# Patient Record
Sex: Male | Born: 1950
Health system: Southern US, Community
[De-identification: ages and names within clinical notes are randomized; demographics above are authoritative.]

## PROBLEM LIST (undated history)

## (undated) DIAGNOSIS — N186 End stage renal disease: Secondary | ICD-10-CM

## (undated) DIAGNOSIS — M1990 Unspecified osteoarthritis, unspecified site: Secondary | ICD-10-CM

## (undated) DIAGNOSIS — G4700 Insomnia, unspecified: Secondary | ICD-10-CM

## (undated) DIAGNOSIS — F329 Major depressive disorder, single episode, unspecified: Secondary | ICD-10-CM

## (undated) DIAGNOSIS — D631 Anemia in chronic kidney disease: Secondary | ICD-10-CM

## (undated) DIAGNOSIS — Z9289 Personal history of other medical treatment: Secondary | ICD-10-CM

## (undated) DIAGNOSIS — I428 Other cardiomyopathies: Secondary | ICD-10-CM

## (undated) DIAGNOSIS — I10 Essential (primary) hypertension: Secondary | ICD-10-CM

## (undated) DIAGNOSIS — E119 Type 2 diabetes mellitus without complications: Secondary | ICD-10-CM

## (undated) DIAGNOSIS — N039 Chronic nephritic syndrome with unspecified morphologic changes: Secondary | ICD-10-CM

## (undated) DIAGNOSIS — F32A Depression, unspecified: Secondary | ICD-10-CM

## (undated) DIAGNOSIS — F419 Anxiety disorder, unspecified: Secondary | ICD-10-CM

## (undated) DIAGNOSIS — H409 Unspecified glaucoma: Secondary | ICD-10-CM

## (undated) DIAGNOSIS — Z8673 Personal history of transient ischemic attack (TIA), and cerebral infarction without residual deficits: Secondary | ICD-10-CM

## (undated) DIAGNOSIS — K219 Gastro-esophageal reflux disease without esophagitis: Secondary | ICD-10-CM

## (undated) DIAGNOSIS — Z992 Dependence on renal dialysis: Secondary | ICD-10-CM

## (undated) HISTORY — PX: LUMBAR SPINE SURGERY: SHX701

## (undated) HISTORY — PX: EYE SURGERY: SHX253

## (undated) HISTORY — DX: Other cardiomyopathies: I42.8

## (undated) HISTORY — PX: BACK SURGERY: SHX140

## (undated) HISTORY — DX: Personal history of transient ischemic attack (TIA), and cerebral infarction without residual deficits: Z86.73

## (undated) HISTORY — PX: AV FISTULA PLACEMENT: SHX1204

## (undated) HISTORY — PX: BELOW KNEE LEG AMPUTATION: SUR23

## (undated) HISTORY — DX: Type 2 diabetes mellitus without complications: E11.9

## (undated) HISTORY — PX: COLONOSCOPY: SHX174

## (undated) HISTORY — DX: Anemia in chronic kidney disease: D63.1

## (undated) HISTORY — DX: Chronic nephritic syndrome with unspecified morphologic changes: N03.9

## (undated) HISTORY — DX: Gastro-esophageal reflux disease without esophagitis: K21.9

## (undated) HISTORY — DX: Essential (primary) hypertension: I10

---

## 1999-10-10 ENCOUNTER — Encounter: Payer: Self-pay | Admitting: Neurosurgery

## 1999-10-10 ENCOUNTER — Ambulatory Visit (HOSPITAL_COMMUNITY): Admission: RE | Admit: 1999-10-10 | Discharge: 1999-10-10 | Payer: Self-pay | Admitting: Neurosurgery

## 2007-02-28 ENCOUNTER — Ambulatory Visit: Payer: Self-pay | Admitting: Cardiology

## 2007-03-09 ENCOUNTER — Ambulatory Visit: Payer: Self-pay | Admitting: Cardiology

## 2007-03-16 ENCOUNTER — Ambulatory Visit (HOSPITAL_COMMUNITY): Admission: RE | Admit: 2007-03-16 | Discharge: 2007-03-16 | Payer: Self-pay | Admitting: Family Medicine

## 2007-03-17 ENCOUNTER — Ambulatory Visit: Payer: Self-pay | Admitting: Cardiology

## 2008-01-27 ENCOUNTER — Ambulatory Visit: Payer: Self-pay | Admitting: Cardiology

## 2008-03-09 ENCOUNTER — Ambulatory Visit (HOSPITAL_COMMUNITY): Admission: RE | Admit: 2008-03-09 | Discharge: 2008-03-09 | Payer: Self-pay | Admitting: Family Medicine

## 2008-03-16 ENCOUNTER — Ambulatory Visit (HOSPITAL_COMMUNITY): Admission: RE | Admit: 2008-03-16 | Discharge: 2008-03-16 | Payer: Self-pay | Admitting: Family Medicine

## 2009-06-20 ENCOUNTER — Ambulatory Visit: Payer: Self-pay | Admitting: Cardiology

## 2010-11-19 ENCOUNTER — Encounter: Payer: Self-pay | Admitting: Cardiology

## 2010-11-20 ENCOUNTER — Encounter: Payer: Self-pay | Admitting: Cardiology

## 2010-11-20 ENCOUNTER — Ambulatory Visit (INDEPENDENT_AMBULATORY_CARE_PROVIDER_SITE_OTHER): Payer: Medicare Other | Admitting: Cardiology

## 2010-11-20 VITALS — BP 154/91 | HR 75 | Ht 74.0 in | Wt 292.0 lb

## 2010-11-20 DIAGNOSIS — I5022 Chronic systolic (congestive) heart failure: Secondary | ICD-10-CM | POA: Insufficient documentation

## 2010-11-20 DIAGNOSIS — N186 End stage renal disease: Secondary | ICD-10-CM

## 2010-11-20 DIAGNOSIS — R0989 Other specified symptoms and signs involving the circulatory and respiratory systems: Secondary | ICD-10-CM | POA: Insufficient documentation

## 2010-11-20 DIAGNOSIS — Z992 Dependence on renal dialysis: Secondary | ICD-10-CM

## 2010-11-20 DIAGNOSIS — I429 Cardiomyopathy, unspecified: Secondary | ICD-10-CM

## 2010-11-20 DIAGNOSIS — I428 Other cardiomyopathies: Secondary | ICD-10-CM | POA: Insufficient documentation

## 2010-11-20 DIAGNOSIS — I10 Essential (primary) hypertension: Secondary | ICD-10-CM

## 2010-11-20 MED ORDER — RAMIPRIL 2.5 MG PO CAPS: 2.5000 mg | ORAL_CAPSULE | Freq: Every day | ORAL | Status: DC

## 2010-11-20 NOTE — Assessment & Plan Note (Signed)
Patient reports stable weight, removal of a few pounds with each hemodialysis session. Denies any escalating dyspnea on exertion

## 2010-11-20 NOTE — Assessment & Plan Note (Signed)
High-pitched, cannot exclude radiation from left forearm AV fistula. Carotid Dopplers will be obtained.

## 2010-11-20 NOTE — Patient Instructions (Signed)
   Follow up in 1 month as scheduled.  Start Altace (ramipril) 2.5mg  once daily. Your physician has requested that you have an echocardiogram. Echocardiography is a painless test that uses sound waves to create images of your heart. It provides your doctor with information about the size and shape of your heart and how well your heart's chambers and valves are working. This procedure takes approximately one hour. There are no restrictions for this procedure. Your physician has requested that you have a carotid duplex. This test is an ultrasound of the carotid arteries in your neck. It looks at blood flow through these arteries that supply the brain with blood. Allow one hour for this exam. There are no restrictions or special instructions.

## 2010-11-20 NOTE — Assessment & Plan Note (Signed)
Continue regular followup with Dr. Lowanda Foster.

## 2010-11-20 NOTE — Assessment & Plan Note (Signed)
Continue regular followup with primary care physician, also regular hemodialysis. We discussed sodium restriction. Altace 2.5 mg daily is also being added.

## 2010-11-20 NOTE — Progress Notes (Signed)
Clinical Summary [name] is a 60 y.o.male referred by Dr. Hilma [name] for cardiology consultation. He has in fact seen Dr. Dannielle [name] in the past, underwent cardiac testing at [facility] back in 2008 and 2009, noted below.  He reports long-standing dyspnea on exertion, NYHA class 2-3 at most, no escalation. He indicates compliance with his hemodialysis, and stable weight overall. No problems with lower extremity edema, palpitations, or syncope. Also denies exertional chest pain.  He has been followed for a nonischemic cardiomyopathy in the past, last LVEF 40-45%, noted below. No evidence of ischemia at that time.  Today we reviewed his medications, discussed the likely etiologies of his cardiomyopathy over the years, noted in the setting of hypertension and regular alcohol use. Also discussed the importance of medical therapy and followup over time, sodium restriction.   Allergies  Allergen Reactions  . Penicillins     Current outpatient prescriptions:aspirin 325 MG tablet, Take 325 mg by mouth daily.  , Disp: , Rfl: ;  B Complex-C-Folic Acid (DIALYVITE TABLET) TABS, Take 1 tablet by mouth as directed.  , Disp: , Rfl: ;  carvedilol (COREG) 6.25 MG tablet, Take 6.25 mg by mouth 2 (two) times daily with a meal.  , Disp: , Rfl: ;  colchicine 0.6 MG tablet, Take 0.6 mg by mouth 2 (two) times daily.  , Disp: , Rfl:  insulin glargine (LANTUS) 100 UNIT/ML injection, Inject into the skin at bedtime.  , Disp: , Rfl: ;  omeprazole (PRILOSEC) 40 MG capsule, Take 40 mg by mouth daily.  , Disp: , Rfl: ;  ramipril (ALTACE) 2.5 MG capsule, Take 1 capsule (2.5 mg total) by mouth daily., Disp: 30 capsule, Rfl: 6;  sevelamer (RENVELA) 800 MG tablet, Take 1,600 mg by mouth 3 (three) times daily with meals.  , Disp: , Rfl:  DISCONTD: hydrOXYzine (ATARAX) 50 MG tablet, Take 50 mg by mouth daily.  , Disp: , Rfl:   Past Medical History  Diagnosis Date  . Nonischemic cardiomyopathy     Hypertension and regular alcohol use,  LVEF 40-45%  . End stage renal disease     Hemodialysis on Monday, Wednesday, Friday  . Chronic systolic heart failure   . Anemia in chronic kidney disease   . Type 2 diabetes mellitus   . Anemia of chronic disease   . Essential hypertension, benign   . GERD (gastroesophageal reflux disease)     Past Surgical History  Procedure Date  . Lumbar spine surgery     Family History  Problem Relation Age of Onset  . Heart attack Brother     25  . Heart attack Brother     37  . Heart attack Brother     50    Social History [name] reports that he has never smoked. He has never used smokeless tobacco. [name] reports that he drinks alcohol.  Review of Systems Reports stable appetite, no palpitations or unusual dizziness. No orthopnea or PND. No cough or wheezing. Otherwise reviewed and negative.  Physical Examination Filed Vitals:   11/20/10 0904  BP: 154/91  Pulse: 75  Obese male in no acute distress. HEENT: Conjunctiva and lids normal, oropharynx with moist mucosa, poor dentition. Neck: Supple, no elevated JVP, possible left carotid bruit. Lungs: Decreased but clear overall, nonlabored. Cardiac: Indistinct PMI, regular rate and rhythm, no S3, soft systolic murmur. Abdomen: Nontender, bowel sounds present, no hepatomegaly. Skin: Warm and dry. Musculoskeletal: No kyphosis. Extremities: No distal pitting edema. AV fistula noted in the  left forearm with palpable thrill. Neuropsychiatric: Alert and oriented x3, affect grossly appropriate.   ECG Sinus rhythm at 72 beats per minute with left anterior fascicular block.  Studies Echocardiogram 01/27/2008: Moderately dilated left ventricle with LVEF 40-45%, moderate left atrial enlargement, mild pulmonary hypertension, no pericardial effusion.  Adenosine Cardiolite 03/09/2007: No diagnostic ST segment changes. No ischemic defects are noted, LVEF 42%.  Problem List and Plan

## 2010-11-20 NOTE — Assessment & Plan Note (Signed)
Noted in the setting of hypertension and also regular alcohol use, previous ischemic workup was reassuring. Plan will be to followup with a 2-D echocardiogram to reassess LVEF, and further titrate medical therapy over time. ACE Inhibitor is being added.

## 2010-11-27 DIAGNOSIS — R0602 Shortness of breath: Secondary | ICD-10-CM

## 2010-12-09 ENCOUNTER — Other Ambulatory Visit (HOSPITAL_COMMUNITY): Payer: Self-pay | Admitting: Family Medicine

## 2010-12-09 DIAGNOSIS — M545 Low back pain, unspecified: Secondary | ICD-10-CM

## 2010-12-09 DIAGNOSIS — M51379 Other intervertebral disc degeneration, lumbosacral region without mention of lumbar back pain or lower extremity pain: Secondary | ICD-10-CM

## 2010-12-09 DIAGNOSIS — M5137 Other intervertebral disc degeneration, lumbosacral region: Secondary | ICD-10-CM

## 2010-12-11 ENCOUNTER — Ambulatory Visit (HOSPITAL_COMMUNITY)
Admission: RE | Admit: 2010-12-11 | Discharge: 2010-12-11 | Disposition: A | Discharge status: Home / Self Care | Payer: Medicare Other | Source: Ambulatory Visit | Attending: Family Medicine | Admitting: Family Medicine

## 2010-12-11 DIAGNOSIS — M545 Low back pain, unspecified: Secondary | ICD-10-CM | POA: Insufficient documentation

## 2010-12-11 DIAGNOSIS — M5137 Other intervertebral disc degeneration, lumbosacral region: Secondary | ICD-10-CM

## 2010-12-11 DIAGNOSIS — M5126 Other intervertebral disc displacement, lumbar region: Secondary | ICD-10-CM | POA: Insufficient documentation

## 2010-12-19 NOTE — Op Note (Signed)
[place]  Patient:    Jacob [name], Jacob [name]                         MRN: [id number] Proc. Date: 10/10/99 Adm. Date:  [id number] Disc. Date: [id number] Attending:  Abran Duke                           Operative Report  PREOPERATIVE DIAGNOSIS:  Left L5-S1 herniated nucleus pulposus with radiculopathy.  POSTOPERATIVE DIAGNOSIS:  Left L5-S1 herniated nucleus pulposus with radiculopathy.  PROCEDURE:  Left L5-S1 laminotomy and microdiskectomy.  SURGEON:  Earnie Larsson, M.D.  ASSISTANT:  None.  ANESTHESIA:  General endotracheal.  INDICATIONS:  Jacob [name] is a 60 year old black male with history of back and left lower extremity pain, paresthesias and weakness consistent with a left-sided S1  radiculopathy, which has failed efforts of conservative management.  MRI scan demonstrates a left L5-S1 disk herniation, compression of the left side S1 nerve root.  Patient has been counselled as to his options and he wishes to proceed with a left-sided L5-S1 laminotomy and microdiskectomy for hopeful improvement of his symptoms.  OPERATIVE NOTE:  Patient taken to the operating room, placed on operating table in supine position.  After adequate level of anesthesia achieved, patient was positioned prone onto a Wilson frame, appropriately padded.  Patients lumbar region was shaved and prepped sterilely.  A #10 blade was used to make a linear  skin incision overlying the L5-S1 interspace.  This carried down sharply in the  midline.  A subperiosteal dissection was performed on the left side exposing the lamina and facet joints of L5 and S1.  Deep self-retaining retractor was placed. X-ray was taken and level was confirmed.  A laminotomy was then performed using a high-speed drill and Kerrison rongeurs to remove the inferior one-half of the lamina of L5, the medial edge of the L5-S1 facet joint and the superior rim of he sacrum.  Ligamentum flavum was then  elevated and resected in a piecemeal fashion using Kerrison rongeurs.  The underlying thecal sac and exiting S1 nerve root were identified.  Microscope was draped and brought into the field and used for microdissection of the left side S1 nerve root and underlying disk herniation.  Epidural venous plexus was coagulated and cut.  Thecal sac and S1 nerve root were mobilized and retracted towards the midline using a DErrico nerve root retractor. The disk herniation was readily apparent.  This was incised with a 15 blade in  rectangular fashion.  A wide disk space cleanout was achieved using pituitary rongeurs, upward-angled pituitary rongeurs and Epstein curets.  All elements of  disk herniation was completely removed.  All ______ of obvious degenerative disk material was removed from the interspace.  At the conclusion of the diskectomy there was no evidence of injury to the thecal sac or nerve roots.  There was no  evidence of any continued compression or residual disk herniation.  The wound was then copiously irrigated with antibiotic solution.  Gelfoam was placed topically for hemostasis.  Microscope and retractor system were removed.  Hemostasis of the muscle achieved with electrocautery.  The wound was then closed in layers with Vicryl sutures.  Staples are applied to the surface.  There were no apparent complications.  The patient tolerated the procedure well and he returned to recovery room postoperatively. DD:  10/10/99 TD:  10/12/99 Job: [id number]

## 2010-12-23 ENCOUNTER — Encounter: Payer: Self-pay | Admitting: Cardiology

## 2010-12-23 ENCOUNTER — Ambulatory Visit (INDEPENDENT_AMBULATORY_CARE_PROVIDER_SITE_OTHER): Payer: Medicare Other | Admitting: Cardiology

## 2010-12-23 VITALS — BP 113/71 | HR 77 | Ht 74.0 in | Wt 294.0 lb

## 2010-12-23 DIAGNOSIS — I428 Other cardiomyopathies: Secondary | ICD-10-CM

## 2010-12-23 DIAGNOSIS — I10 Essential (primary) hypertension: Secondary | ICD-10-CM

## 2010-12-23 DIAGNOSIS — R0989 Other specified symptoms and signs involving the circulatory and respiratory systems: Secondary | ICD-10-CM

## 2010-12-23 NOTE — Patient Instructions (Addendum)
Continue all current medications.  Labs for FLP & LFT just before next office visit.  Will mail reminder to you. Follow up in  4 months

## 2010-12-23 NOTE — Assessment & Plan Note (Addendum)
Only mild carotid artery atherosclerosis noted by recent Dopplers. Continue aspirin. We did discuss aggressive lipid management. Followup fasting lipid profile and liver function tests for his next visit.

## 2010-12-23 NOTE — Progress Notes (Signed)
Clinical Summary Mr. Butorac is a 60 y.o.male presenting for followup. He was seen in late April for followup of nonischemic cardiomyopathy. Low-dose Altace was added. Followup echocardiogram is noted below, showing overall preserved LVEF. Carotid Dopplers showed overall mild bilateral carotid plaque, less than 50%.  We reviewed the results of his studies today. He reports compliance with medications and hemodialysis. No progressive shortness of breath or chest pain. States that he had his lipids checked a few months ago by Dr. Hilma Favors, has not been on any lipid-lowering agents over time.   Allergies  Allergen Reactions  . Penicillins     Current outpatient prescriptions:aspirin 325 MG tablet, Take 325 mg by mouth daily.  , Disp: , Rfl: ;  B Complex-C-Folic Acid (DIALYVITE TABLET) TABS, Take 1 tablet by mouth as directed.  , Disp: , Rfl: ;  carvedilol (COREG) 6.25 MG tablet, Take 6.25 mg by mouth 2 (two) times daily with a meal.  , Disp: , Rfl: ;  colchicine 0.6 MG tablet, Take 0.6 mg by mouth 2 (two) times daily.  , Disp: , Rfl:  insulin glargine (LANTUS) 100 UNIT/ML injection, Inject into the skin at bedtime.  , Disp: , Rfl: ;  nabumetone (RELAFEN) 750 MG tablet, Take 1 tablet by mouth Twice daily., Disp: , Rfl: ;  omeprazole (PRILOSEC) 40 MG capsule, Take 40 mg by mouth daily.  , Disp: , Rfl: ;  ramipril (ALTACE) 2.5 MG capsule, Take 1 capsule (2.5 mg total) by mouth daily., Disp: 30 capsule, Rfl: 6 sevelamer (RENVELA) 800 MG tablet, Take 1,600 mg by mouth 3 (three) times daily with meals.  , Disp: , Rfl: ;  traMADol (ULTRAM) 50 MG tablet, Take 1 tablet by mouth Three times a day., Disp: , Rfl:   Past Medical History  Diagnosis Date  . Nonischemic cardiomyopathy     Hypertension and regular alcohol use, LVEF 40-45%  . End stage renal disease     Hemodialysis on Monday, Wednesday, Friday  . Chronic systolic heart failure   . Anemia in chronic kidney disease   . Type 2 diabetes mellitus   .  Anemia of chronic disease   . Essential hypertension, benign   . GERD (gastroesophageal reflux disease)     Social History Mr. Askar reports that he has never smoked. He has never used smokeless tobacco. Mr. Boehl reports that he drinks alcohol.  Review of Systems Reports problems with back pain, followed by Dr. Joya Salm. No palpitations or syncope. Otherwise reviewed and negative.  Physical Examination Filed Vitals:   12/23/10 0845  BP: 113/71  Pulse: 77  Obese male in no acute distress.  HEENT: Conjunctiva and lids normal, oropharynx with moist mucosa, poor dentition.  Neck: Supple, no elevated JVP, possible left carotid bruit.  Lungs: Decreased but clear overall, nonlabored.  Cardiac: Indistinct PMI, regular rate and rhythm, no S3, soft systolic murmur.  Abdomen: Nontender, bowel sounds present, no hepatomegaly.  Skin: Warm and dry.  Musculoskeletal: No kyphosis.  Extremities: No distal pitting edema. AV fistula noted in the left forearm with palpable thrill.  Neuropsychiatric: Alert and oriented x3, affect grossly appropriate.   Studies Echocardiogram 11/27/2010: Technically difficult study. Normal LV chamber size with moderate LVH, overall preserved LVEF, mild mitral regurgitation, trace tricuspid regurgitation.  Carotid Dopplers 11/27/2010: Early bilateral carotid bifurcation plaque resulting in less than 50% diameter stenosis.  Problem List and Plan

## 2010-12-23 NOTE — Assessment & Plan Note (Signed)
Overall preserved LVEF by technically difficult echocardiogram done recently. Plan to continue medical therapy aimed at blood pressure control, afterload reduction, also continued hemodialysis for volume management.

## 2010-12-23 NOTE — Assessment & Plan Note (Signed)
Blood pressure well-controlled today. 

## 2011-01-08 ENCOUNTER — Encounter: Payer: Self-pay | Admitting: Physician Assistant

## 2011-01-08 ENCOUNTER — Encounter: Payer: Self-pay | Admitting: Cardiology

## 2011-03-12 ENCOUNTER — Other Ambulatory Visit: Payer: Self-pay | Admitting: Cardiology

## 2011-03-12 DIAGNOSIS — I10 Essential (primary) hypertension: Secondary | ICD-10-CM

## 2011-03-12 DIAGNOSIS — E7800 Pure hypercholesterolemia, unspecified: Secondary | ICD-10-CM

## 2011-03-12 DIAGNOSIS — I428 Other cardiomyopathies: Secondary | ICD-10-CM

## 2011-04-27 ENCOUNTER — Ambulatory Visit: Payer: Medicare Other | Admitting: Cardiology

## 2011-06-16 ENCOUNTER — Encounter: Payer: Self-pay | Admitting: Cardiology

## 2011-06-16 ENCOUNTER — Ambulatory Visit (INDEPENDENT_AMBULATORY_CARE_PROVIDER_SITE_OTHER): Payer: Medicare Other | Admitting: Cardiology

## 2011-06-16 VITALS — BP 136/80 | HR 73 | Ht 74.0 in | Wt 300.0 lb

## 2011-06-16 DIAGNOSIS — Z992 Dependence on renal dialysis: Secondary | ICD-10-CM

## 2011-06-16 DIAGNOSIS — I509 Heart failure, unspecified: Secondary | ICD-10-CM

## 2011-06-16 DIAGNOSIS — E782 Mixed hyperlipidemia: Secondary | ICD-10-CM | POA: Insufficient documentation

## 2011-06-16 DIAGNOSIS — I428 Other cardiomyopathies: Secondary | ICD-10-CM

## 2011-06-16 DIAGNOSIS — I10 Essential (primary) hypertension: Secondary | ICD-10-CM

## 2011-06-16 DIAGNOSIS — N186 End stage renal disease: Secondary | ICD-10-CM

## 2011-06-16 NOTE — Patient Instructions (Signed)
Your physician wants you to follow-up in: 6 months. You will receive a reminder letter in the mail one-two months in advance. If you don't receive a letter, please call our office to schedule the follow-up appointment. Your physician recommends that you continue on your current medications as directed. Please refer to the Current Medication list given to you today. Your physician has requested that you have an echocardiogram. Echocardiography is a painless test that uses sound waves to create images of your heart. It provides your doctor with information about the size and shape of your heart and how well your heart's chambers and valves are working. This procedure takes approximately one hour. There are no restrictions for this procedure. DO ECHO IN 6 MONTHS BEFORE NEXT OFFICE VISIT.

## 2011-06-16 NOTE — Assessment & Plan Note (Signed)
LDL 141. In general would recommend statin therapy in light of diabetes mellitus to achieve LDL under 100. Also diet and weight loss. He did not want to start any new medications as yet.

## 2011-06-16 NOTE — Assessment & Plan Note (Signed)
Blood pressure trend is better in general.

## 2011-06-16 NOTE — Assessment & Plan Note (Signed)
Followed by Dr. Lowanda Foster.

## 2011-06-16 NOTE — Assessment & Plan Note (Signed)
Symptomatically stable. Recommended diet and weight loss, also exercise. Continue medical therapy. Followup echocardiogram for next visit.

## 2011-06-16 NOTE — Progress Notes (Signed)
Clinical Summary [name] is a 60 y.o.male presenting for followup. He was seen in May. He reports compliance with hemodialysis, followed by Dr. Lowanda Foster. No chest pain or palpitations. He indicates stable dyspnea on exertion, at NYHA class 2-3. Remains significantly overweight, states he has not been exercising. We did discuss diet and exercise.  Lab work from September showed AST 20, ALT 16, cholesterol 224, triglycerides 194, HDL 44, LDL 141. We reviewed these numbers today. Discussed statin therapy in light of diabetes for more aggressive LDL control and risk reduction over time. At this point he did not want to consider any new medications.   Allergies  Allergen Reactions  . Penicillins     Medication list reviewed.  Past Medical History  Diagnosis Date  . Nonischemic cardiomyopathy     Hypertension and regular alcohol use, LVEF 40-45%  . End stage renal disease     Hemodialysis on Monday, Wednesday, Friday  . Chronic systolic heart failure   . Anemia in chronic kidney disease   . Type 2 diabetes mellitus   . Anemia of chronic disease   . Essential hypertension, benign   . GERD (gastroesophageal reflux disease)     Past Surgical History  Procedure Date  . Lumbar spine surgery     Family History  Problem Relation Age of Onset  . Heart attack Brother     26  . Heart attack Brother     58  . Heart attack Brother     8    Social History [name] reports that he has never smoked. He has never used smokeless tobacco. [name] reports that he drinks alcohol.  Review of Systems As outlined above, otherwise negative.  Physical Examination Filed Vitals:   06/16/11 0818  BP: 136/80  Pulse: 73    Obese male in no acute distress.  HEENT: Conjunctiva and lids normal, oropharynx with moist mucosa, poor dentition.  Neck: Supple, no elevated JVP, possible left carotid bruit.  Lungs: Decreased but clear overall, nonlabored.  Cardiac: Indistinct PMI, regular rate and  rhythm, no S3, soft systolic murmur.  Abdomen: Nontender, bowel sounds present, no hepatomegaly.  Skin: Warm and dry.  Musculoskeletal: No kyphosis.  Extremities: No distal pitting edema. AV fistula noted in the left forearm with palpable thrill.  Neuropsychiatric: Alert and oriented x3, affect grossly appropriate.   ECG Reviewed in EMR.   Problem List and Plan

## 2011-08-05 DIAGNOSIS — D509 Iron deficiency anemia, unspecified: Secondary | ICD-10-CM | POA: Diagnosis not present

## 2011-08-05 DIAGNOSIS — N186 End stage renal disease: Secondary | ICD-10-CM | POA: Diagnosis not present

## 2011-08-05 DIAGNOSIS — N2581 Secondary hyperparathyroidism of renal origin: Secondary | ICD-10-CM | POA: Diagnosis not present

## 2011-08-06 DIAGNOSIS — M5380 Other specified dorsopathies, site unspecified: Secondary | ICD-10-CM | POA: Diagnosis not present

## 2011-08-06 DIAGNOSIS — M545 Low back pain, unspecified: Secondary | ICD-10-CM | POA: Diagnosis not present

## 2011-08-12 DIAGNOSIS — E119 Type 2 diabetes mellitus without complications: Secondary | ICD-10-CM | POA: Diagnosis not present

## 2011-08-27 DIAGNOSIS — M545 Low back pain, unspecified: Secondary | ICD-10-CM | POA: Diagnosis not present

## 2011-08-27 DIAGNOSIS — M5380 Other specified dorsopathies, site unspecified: Secondary | ICD-10-CM | POA: Diagnosis not present

## 2011-09-03 DIAGNOSIS — N186 End stage renal disease: Secondary | ICD-10-CM | POA: Diagnosis not present

## 2011-09-04 DIAGNOSIS — D509 Iron deficiency anemia, unspecified: Secondary | ICD-10-CM | POA: Diagnosis not present

## 2011-09-04 DIAGNOSIS — N2581 Secondary hyperparathyroidism of renal origin: Secondary | ICD-10-CM | POA: Diagnosis not present

## 2011-09-04 DIAGNOSIS — N186 End stage renal disease: Secondary | ICD-10-CM | POA: Diagnosis not present

## 2011-09-09 DIAGNOSIS — E119 Type 2 diabetes mellitus without complications: Secondary | ICD-10-CM | POA: Diagnosis not present

## 2011-10-01 DIAGNOSIS — N186 End stage renal disease: Secondary | ICD-10-CM | POA: Diagnosis not present

## 2011-10-02 DIAGNOSIS — D509 Iron deficiency anemia, unspecified: Secondary | ICD-10-CM | POA: Diagnosis not present

## 2011-10-02 DIAGNOSIS — Z992 Dependence on renal dialysis: Secondary | ICD-10-CM | POA: Diagnosis not present

## 2011-10-02 DIAGNOSIS — N186 End stage renal disease: Secondary | ICD-10-CM | POA: Diagnosis not present

## 2011-10-02 DIAGNOSIS — N2581 Secondary hyperparathyroidism of renal origin: Secondary | ICD-10-CM | POA: Diagnosis not present

## 2011-10-07 DIAGNOSIS — E119 Type 2 diabetes mellitus without complications: Secondary | ICD-10-CM | POA: Diagnosis not present

## 2011-10-13 DIAGNOSIS — M5380 Other specified dorsopathies, site unspecified: Secondary | ICD-10-CM | POA: Diagnosis not present

## 2011-10-13 DIAGNOSIS — M545 Low back pain, unspecified: Secondary | ICD-10-CM | POA: Diagnosis not present

## 2011-10-21 DIAGNOSIS — N186 End stage renal disease: Secondary | ICD-10-CM | POA: Diagnosis not present

## 2011-10-23 ENCOUNTER — Other Ambulatory Visit: Payer: Self-pay | Admitting: Cardiology

## 2011-11-01 DIAGNOSIS — N186 End stage renal disease: Secondary | ICD-10-CM | POA: Diagnosis not present

## 2011-11-02 DIAGNOSIS — N2581 Secondary hyperparathyroidism of renal origin: Secondary | ICD-10-CM | POA: Diagnosis not present

## 2011-11-02 DIAGNOSIS — N186 End stage renal disease: Secondary | ICD-10-CM | POA: Diagnosis not present

## 2011-11-02 DIAGNOSIS — D509 Iron deficiency anemia, unspecified: Secondary | ICD-10-CM | POA: Diagnosis not present

## 2011-11-03 DIAGNOSIS — M6281 Muscle weakness (generalized): Secondary | ICD-10-CM | POA: Diagnosis not present

## 2011-11-03 DIAGNOSIS — IMO0001 Reserved for inherently not codable concepts without codable children: Secondary | ICD-10-CM | POA: Diagnosis not present

## 2011-11-03 DIAGNOSIS — M545 Low back pain: Secondary | ICD-10-CM | POA: Diagnosis not present

## 2011-11-04 DIAGNOSIS — E119 Type 2 diabetes mellitus without complications: Secondary | ICD-10-CM | POA: Diagnosis not present

## 2011-11-05 DIAGNOSIS — IMO0001 Reserved for inherently not codable concepts without codable children: Secondary | ICD-10-CM | POA: Diagnosis not present

## 2011-11-05 DIAGNOSIS — M6281 Muscle weakness (generalized): Secondary | ICD-10-CM | POA: Diagnosis not present

## 2011-11-05 DIAGNOSIS — M545 Low back pain: Secondary | ICD-10-CM | POA: Diagnosis not present

## 2011-11-10 DIAGNOSIS — IMO0001 Reserved for inherently not codable concepts without codable children: Secondary | ICD-10-CM | POA: Diagnosis not present

## 2011-11-10 DIAGNOSIS — M6281 Muscle weakness (generalized): Secondary | ICD-10-CM | POA: Diagnosis not present

## 2011-11-10 DIAGNOSIS — M545 Low back pain: Secondary | ICD-10-CM | POA: Diagnosis not present

## 2011-12-01 ENCOUNTER — Other Ambulatory Visit: Payer: Self-pay | Admitting: *Deleted

## 2011-12-01 DIAGNOSIS — I428 Other cardiomyopathies: Secondary | ICD-10-CM

## 2011-12-01 DIAGNOSIS — N186 End stage renal disease: Secondary | ICD-10-CM | POA: Diagnosis not present

## 2011-12-02 DIAGNOSIS — N2581 Secondary hyperparathyroidism of renal origin: Secondary | ICD-10-CM | POA: Diagnosis not present

## 2011-12-02 DIAGNOSIS — N186 End stage renal disease: Secondary | ICD-10-CM | POA: Diagnosis not present

## 2011-12-02 DIAGNOSIS — D509 Iron deficiency anemia, unspecified: Secondary | ICD-10-CM | POA: Diagnosis not present

## 2011-12-03 ENCOUNTER — Other Ambulatory Visit (INDEPENDENT_AMBULATORY_CARE_PROVIDER_SITE_OTHER): Payer: Medicare Other

## 2011-12-03 ENCOUNTER — Other Ambulatory Visit: Payer: Self-pay

## 2011-12-03 DIAGNOSIS — I509 Heart failure, unspecified: Secondary | ICD-10-CM

## 2011-12-03 DIAGNOSIS — I428 Other cardiomyopathies: Secondary | ICD-10-CM | POA: Diagnosis not present

## 2011-12-04 DIAGNOSIS — D509 Iron deficiency anemia, unspecified: Secondary | ICD-10-CM | POA: Diagnosis not present

## 2011-12-04 DIAGNOSIS — N186 End stage renal disease: Secondary | ICD-10-CM | POA: Diagnosis not present

## 2011-12-04 DIAGNOSIS — N2581 Secondary hyperparathyroidism of renal origin: Secondary | ICD-10-CM | POA: Diagnosis not present

## 2011-12-07 DIAGNOSIS — N2581 Secondary hyperparathyroidism of renal origin: Secondary | ICD-10-CM | POA: Diagnosis not present

## 2011-12-07 DIAGNOSIS — N186 End stage renal disease: Secondary | ICD-10-CM | POA: Diagnosis not present

## 2011-12-07 DIAGNOSIS — D509 Iron deficiency anemia, unspecified: Secondary | ICD-10-CM | POA: Diagnosis not present

## 2011-12-09 DIAGNOSIS — N2581 Secondary hyperparathyroidism of renal origin: Secondary | ICD-10-CM | POA: Diagnosis not present

## 2011-12-09 DIAGNOSIS — E119 Type 2 diabetes mellitus without complications: Secondary | ICD-10-CM | POA: Diagnosis not present

## 2011-12-09 DIAGNOSIS — D509 Iron deficiency anemia, unspecified: Secondary | ICD-10-CM | POA: Diagnosis not present

## 2011-12-09 DIAGNOSIS — N186 End stage renal disease: Secondary | ICD-10-CM | POA: Diagnosis not present

## 2011-12-11 DIAGNOSIS — N186 End stage renal disease: Secondary | ICD-10-CM | POA: Diagnosis not present

## 2011-12-11 DIAGNOSIS — N2581 Secondary hyperparathyroidism of renal origin: Secondary | ICD-10-CM | POA: Diagnosis not present

## 2011-12-11 DIAGNOSIS — D509 Iron deficiency anemia, unspecified: Secondary | ICD-10-CM | POA: Diagnosis not present

## 2011-12-14 DIAGNOSIS — D509 Iron deficiency anemia, unspecified: Secondary | ICD-10-CM | POA: Diagnosis not present

## 2011-12-14 DIAGNOSIS — N186 End stage renal disease: Secondary | ICD-10-CM | POA: Diagnosis not present

## 2011-12-14 DIAGNOSIS — N2581 Secondary hyperparathyroidism of renal origin: Secondary | ICD-10-CM | POA: Diagnosis not present

## 2011-12-16 DIAGNOSIS — N186 End stage renal disease: Secondary | ICD-10-CM | POA: Diagnosis not present

## 2011-12-16 DIAGNOSIS — N2581 Secondary hyperparathyroidism of renal origin: Secondary | ICD-10-CM | POA: Diagnosis not present

## 2011-12-16 DIAGNOSIS — D509 Iron deficiency anemia, unspecified: Secondary | ICD-10-CM | POA: Diagnosis not present

## 2011-12-17 ENCOUNTER — Encounter (INDEPENDENT_AMBULATORY_CARE_PROVIDER_SITE_OTHER): Payer: Medicare Other

## 2011-12-17 ENCOUNTER — Other Ambulatory Visit: Payer: Medicare Other

## 2011-12-17 ENCOUNTER — Other Ambulatory Visit: Payer: Self-pay | Admitting: *Deleted

## 2011-12-17 DIAGNOSIS — I6529 Occlusion and stenosis of unspecified carotid artery: Secondary | ICD-10-CM

## 2011-12-18 ENCOUNTER — Telehealth: Payer: Self-pay | Admitting: *Deleted

## 2011-12-18 DIAGNOSIS — N186 End stage renal disease: Secondary | ICD-10-CM | POA: Diagnosis not present

## 2011-12-18 DIAGNOSIS — N2581 Secondary hyperparathyroidism of renal origin: Secondary | ICD-10-CM | POA: Diagnosis not present

## 2011-12-18 DIAGNOSIS — D509 Iron deficiency anemia, unspecified: Secondary | ICD-10-CM | POA: Diagnosis not present

## 2011-12-18 NOTE — Telephone Encounter (Signed)
Message copied by Merlene Laughter on Fri Dec 18, 2011  3:54 PM ------      Message from: Satira Sark      Created: Fri Dec 18, 2011  2:04 PM       No obstructive disease with less than 39 percent bilateral ICA stenoses.

## 2011-12-18 NOTE — Telephone Encounter (Signed)
Patient informed. 

## 2011-12-21 DIAGNOSIS — N2581 Secondary hyperparathyroidism of renal origin: Secondary | ICD-10-CM | POA: Diagnosis not present

## 2011-12-21 DIAGNOSIS — D509 Iron deficiency anemia, unspecified: Secondary | ICD-10-CM | POA: Diagnosis not present

## 2011-12-21 DIAGNOSIS — N186 End stage renal disease: Secondary | ICD-10-CM | POA: Diagnosis not present

## 2011-12-23 DIAGNOSIS — N2581 Secondary hyperparathyroidism of renal origin: Secondary | ICD-10-CM | POA: Diagnosis not present

## 2011-12-23 DIAGNOSIS — D509 Iron deficiency anemia, unspecified: Secondary | ICD-10-CM | POA: Diagnosis not present

## 2011-12-23 DIAGNOSIS — N186 End stage renal disease: Secondary | ICD-10-CM | POA: Diagnosis not present

## 2011-12-25 DIAGNOSIS — N186 End stage renal disease: Secondary | ICD-10-CM | POA: Diagnosis not present

## 2011-12-25 DIAGNOSIS — N2581 Secondary hyperparathyroidism of renal origin: Secondary | ICD-10-CM | POA: Diagnosis not present

## 2011-12-25 DIAGNOSIS — D509 Iron deficiency anemia, unspecified: Secondary | ICD-10-CM | POA: Diagnosis not present

## 2011-12-28 DIAGNOSIS — D509 Iron deficiency anemia, unspecified: Secondary | ICD-10-CM | POA: Diagnosis not present

## 2011-12-28 DIAGNOSIS — N186 End stage renal disease: Secondary | ICD-10-CM | POA: Diagnosis not present

## 2011-12-28 DIAGNOSIS — N2581 Secondary hyperparathyroidism of renal origin: Secondary | ICD-10-CM | POA: Diagnosis not present

## 2011-12-30 DIAGNOSIS — D509 Iron deficiency anemia, unspecified: Secondary | ICD-10-CM | POA: Diagnosis not present

## 2011-12-30 DIAGNOSIS — N186 End stage renal disease: Secondary | ICD-10-CM | POA: Diagnosis not present

## 2011-12-30 DIAGNOSIS — N2581 Secondary hyperparathyroidism of renal origin: Secondary | ICD-10-CM | POA: Diagnosis not present

## 2012-01-01 DIAGNOSIS — N2581 Secondary hyperparathyroidism of renal origin: Secondary | ICD-10-CM | POA: Diagnosis not present

## 2012-01-01 DIAGNOSIS — D509 Iron deficiency anemia, unspecified: Secondary | ICD-10-CM | POA: Diagnosis not present

## 2012-01-01 DIAGNOSIS — N186 End stage renal disease: Secondary | ICD-10-CM | POA: Diagnosis not present

## 2012-01-04 DIAGNOSIS — N2581 Secondary hyperparathyroidism of renal origin: Secondary | ICD-10-CM | POA: Diagnosis not present

## 2012-01-04 DIAGNOSIS — D509 Iron deficiency anemia, unspecified: Secondary | ICD-10-CM | POA: Diagnosis not present

## 2012-01-04 DIAGNOSIS — D631 Anemia in chronic kidney disease: Secondary | ICD-10-CM | POA: Diagnosis not present

## 2012-01-04 DIAGNOSIS — N186 End stage renal disease: Secondary | ICD-10-CM | POA: Diagnosis not present

## 2012-01-05 ENCOUNTER — Encounter: Payer: Self-pay | Admitting: Cardiology

## 2012-01-05 ENCOUNTER — Ambulatory Visit (INDEPENDENT_AMBULATORY_CARE_PROVIDER_SITE_OTHER): Payer: Medicare Other | Admitting: Cardiology

## 2012-01-05 VITALS — BP 125/71 | HR 78 | Resp 18 | Ht 74.0 in | Wt 296.0 lb

## 2012-01-05 DIAGNOSIS — R0989 Other specified symptoms and signs involving the circulatory and respiratory systems: Secondary | ICD-10-CM

## 2012-01-05 DIAGNOSIS — I428 Other cardiomyopathies: Secondary | ICD-10-CM | POA: Diagnosis not present

## 2012-01-05 DIAGNOSIS — I10 Essential (primary) hypertension: Secondary | ICD-10-CM | POA: Diagnosis not present

## 2012-01-05 NOTE — Progress Notes (Signed)
Clinical Summary [name] is a 61 y.o.male presenting for followup. He was seen in November 2012.  Recent followup echocardiogram shows a significant improvement in LVEF up to 55-60% range, grade 1 diastolic dysfunction. Carotid Dopplers showed only mild, nonobstructive internal carotid artery disease. We discussed the results today.  He reports NYHA class II dyspnea on exertion, no chest pain. States that he is compliant with hemodialysis sessions. Has not been consistent with his medical therapy, and we discussed regular use of Coreg and Altace today, unless low blood pressure is becoming an issue.   Allergies  Allergen Reactions  . Penicillins     Current Outpatient Prescriptions  Medication Sig Dispense Refill  . aspirin 325 MG tablet Take 325 mg by mouth daily.        . B Complex-C-Folic Acid (DIALYVITE TABLET) TABS Take 1 tablet by mouth as directed.        . carvedilol (COREG) 6.25 MG tablet Take 6.25 mg by mouth 2 (two) times daily with a meal.        . colchicine 0.6 MG tablet Take 0.6 mg by mouth 2 (two) times daily.        . insulin glargine (LANTUS) 100 UNIT/ML injection Inject into the skin at bedtime.        . nabumetone (RELAFEN) 750 MG tablet Take 1 tablet by mouth Twice daily.      [name] omeprazole (PRILOSEC) 40 MG capsule Take 40 mg by mouth daily.        . ramipril (ALTACE) 2.5 MG capsule TAKE 1 BY MOUTH DAILY  30 capsule  6  . sevelamer (RENVELA) 800 MG tablet Take 1,600 mg by mouth 3 (three) times daily with meals.        . traMADol (ULTRAM) 50 MG tablet Take 1 tablet by mouth Three times a day.        Past Medical History  Diagnosis Date  . Nonischemic cardiomyopathy     Hypertension and regular alcohol use, LVEF 40-45%  . End stage renal disease     Hemodialysis on Monday, Wednesday, Friday  . Chronic systolic heart failure   . Anemia in chronic kidney disease   . Type 2 diabetes mellitus   . Anemia of chronic disease   . Essential hypertension, benign   .  GERD (gastroesophageal reflux disease)     Social History Mr. Minotti reports that he has never smoked. He has never used smokeless tobacco. Mr. Gangwer reports that he drinks alcohol.  Review of Systems No palpitations or syncope. Stable appetite. No bleeding problems. Otherwise negative.  Physical Examination Filed Vitals:   01/05/12 0850  BP: 125/71  Pulse: 78  Resp: 18    Obese male in no acute distress.  HEENT: Conjunctiva and lids normal, oropharynx with moist mucosa, poor dentition.  Neck: Supple, no elevated JVP, possible left carotid bruit.  Lungs: Decreased but clear overall, nonlabored.  Cardiac: Indistinct PMI, regular rate and rhythm, no S3, soft systolic murmur.  Abdomen: Nontender, bowel sounds present, no hepatomegaly.  Musculoskeletal: No kyphosis.  Extremities: No distal pitting edema. AV fistula noted in the left forearm with palpable thrill.    Problem List and Plan   Nonischemic cardiomyopathy LVEF has improved to the range of 55-60% by recent echocardiogram. Reinforced compliance with medications. Otherwise no changes made.  Carotid bruit Only mild, nonobstructive atherosclerosis of the ICAs noted bilaterally.  Essential hypertension, benign Continue present medical therapy. Blood pressure control is good today.  Satira Sark, M.D., F.A.C.C.

## 2012-01-05 NOTE — Assessment & Plan Note (Signed)
Continue present medical therapy. Blood pressure control is good today.

## 2012-01-05 NOTE — Assessment & Plan Note (Signed)
LVEF has improved to the range of 55-60% by recent echocardiogram. Reinforced compliance with medications. Otherwise no changes made.

## 2012-01-05 NOTE — Patient Instructions (Signed)
Continue all current medications. Your physician wants you to follow up in: 6 months.  You will receive a reminder letter in the mail one-two months in advance.  If you don't receive a letter, please call our office to schedule the follow up appointment   

## 2012-01-05 NOTE — Assessment & Plan Note (Signed)
Only mild, nonobstructive atherosclerosis of the ICAs noted bilaterally.

## 2012-01-06 DIAGNOSIS — N2581 Secondary hyperparathyroidism of renal origin: Secondary | ICD-10-CM | POA: Diagnosis not present

## 2012-01-06 DIAGNOSIS — N186 End stage renal disease: Secondary | ICD-10-CM | POA: Diagnosis not present

## 2012-01-06 DIAGNOSIS — E119 Type 2 diabetes mellitus without complications: Secondary | ICD-10-CM | POA: Diagnosis not present

## 2012-01-06 DIAGNOSIS — N039 Chronic nephritic syndrome with unspecified morphologic changes: Secondary | ICD-10-CM | POA: Diagnosis not present

## 2012-01-06 DIAGNOSIS — D509 Iron deficiency anemia, unspecified: Secondary | ICD-10-CM | POA: Diagnosis not present

## 2012-01-08 DIAGNOSIS — N186 End stage renal disease: Secondary | ICD-10-CM | POA: Diagnosis not present

## 2012-01-08 DIAGNOSIS — N2581 Secondary hyperparathyroidism of renal origin: Secondary | ICD-10-CM | POA: Diagnosis not present

## 2012-01-08 DIAGNOSIS — D509 Iron deficiency anemia, unspecified: Secondary | ICD-10-CM | POA: Diagnosis not present

## 2012-01-08 DIAGNOSIS — N039 Chronic nephritic syndrome with unspecified morphologic changes: Secondary | ICD-10-CM | POA: Diagnosis not present

## 2012-01-11 DIAGNOSIS — D509 Iron deficiency anemia, unspecified: Secondary | ICD-10-CM | POA: Diagnosis not present

## 2012-01-11 DIAGNOSIS — N186 End stage renal disease: Secondary | ICD-10-CM | POA: Diagnosis not present

## 2012-01-11 DIAGNOSIS — N2581 Secondary hyperparathyroidism of renal origin: Secondary | ICD-10-CM | POA: Diagnosis not present

## 2012-01-11 DIAGNOSIS — D631 Anemia in chronic kidney disease: Secondary | ICD-10-CM | POA: Diagnosis not present

## 2012-01-12 DIAGNOSIS — T82898A Other specified complication of vascular prosthetic devices, implants and grafts, initial encounter: Secondary | ICD-10-CM | POA: Diagnosis not present

## 2012-01-12 DIAGNOSIS — N186 End stage renal disease: Secondary | ICD-10-CM | POA: Diagnosis not present

## 2012-01-12 DIAGNOSIS — I871 Compression of vein: Secondary | ICD-10-CM | POA: Diagnosis not present

## 2012-01-13 DIAGNOSIS — N2581 Secondary hyperparathyroidism of renal origin: Secondary | ICD-10-CM | POA: Diagnosis not present

## 2012-01-13 DIAGNOSIS — N186 End stage renal disease: Secondary | ICD-10-CM | POA: Diagnosis not present

## 2012-01-13 DIAGNOSIS — N039 Chronic nephritic syndrome with unspecified morphologic changes: Secondary | ICD-10-CM | POA: Diagnosis not present

## 2012-01-13 DIAGNOSIS — D509 Iron deficiency anemia, unspecified: Secondary | ICD-10-CM | POA: Diagnosis not present

## 2012-01-15 DIAGNOSIS — N2581 Secondary hyperparathyroidism of renal origin: Secondary | ICD-10-CM | POA: Diagnosis not present

## 2012-01-15 DIAGNOSIS — N186 End stage renal disease: Secondary | ICD-10-CM | POA: Diagnosis not present

## 2012-01-15 DIAGNOSIS — D631 Anemia in chronic kidney disease: Secondary | ICD-10-CM | POA: Diagnosis not present

## 2012-01-15 DIAGNOSIS — D509 Iron deficiency anemia, unspecified: Secondary | ICD-10-CM | POA: Diagnosis not present

## 2012-01-18 DIAGNOSIS — N186 End stage renal disease: Secondary | ICD-10-CM | POA: Diagnosis not present

## 2012-01-18 DIAGNOSIS — N2581 Secondary hyperparathyroidism of renal origin: Secondary | ICD-10-CM | POA: Diagnosis not present

## 2012-01-18 DIAGNOSIS — D631 Anemia in chronic kidney disease: Secondary | ICD-10-CM | POA: Diagnosis not present

## 2012-01-18 DIAGNOSIS — D509 Iron deficiency anemia, unspecified: Secondary | ICD-10-CM | POA: Diagnosis not present

## 2012-01-20 DIAGNOSIS — N186 End stage renal disease: Secondary | ICD-10-CM | POA: Diagnosis not present

## 2012-01-20 DIAGNOSIS — N039 Chronic nephritic syndrome with unspecified morphologic changes: Secondary | ICD-10-CM | POA: Diagnosis not present

## 2012-01-20 DIAGNOSIS — N2581 Secondary hyperparathyroidism of renal origin: Secondary | ICD-10-CM | POA: Diagnosis not present

## 2012-01-20 DIAGNOSIS — D509 Iron deficiency anemia, unspecified: Secondary | ICD-10-CM | POA: Diagnosis not present

## 2012-01-22 DIAGNOSIS — N186 End stage renal disease: Secondary | ICD-10-CM | POA: Diagnosis not present

## 2012-01-22 DIAGNOSIS — D509 Iron deficiency anemia, unspecified: Secondary | ICD-10-CM | POA: Diagnosis not present

## 2012-01-22 DIAGNOSIS — N2581 Secondary hyperparathyroidism of renal origin: Secondary | ICD-10-CM | POA: Diagnosis not present

## 2012-01-22 DIAGNOSIS — N039 Chronic nephritic syndrome with unspecified morphologic changes: Secondary | ICD-10-CM | POA: Diagnosis not present

## 2012-01-22 DIAGNOSIS — D631 Anemia in chronic kidney disease: Secondary | ICD-10-CM | POA: Diagnosis not present

## 2012-01-25 DIAGNOSIS — D631 Anemia in chronic kidney disease: Secondary | ICD-10-CM | POA: Diagnosis not present

## 2012-01-25 DIAGNOSIS — N186 End stage renal disease: Secondary | ICD-10-CM | POA: Diagnosis not present

## 2012-01-25 DIAGNOSIS — N2581 Secondary hyperparathyroidism of renal origin: Secondary | ICD-10-CM | POA: Diagnosis not present

## 2012-01-25 DIAGNOSIS — D509 Iron deficiency anemia, unspecified: Secondary | ICD-10-CM | POA: Diagnosis not present

## 2012-01-25 DIAGNOSIS — N039 Chronic nephritic syndrome with unspecified morphologic changes: Secondary | ICD-10-CM | POA: Diagnosis not present

## 2012-01-27 DIAGNOSIS — N039 Chronic nephritic syndrome with unspecified morphologic changes: Secondary | ICD-10-CM | POA: Diagnosis not present

## 2012-01-27 DIAGNOSIS — D509 Iron deficiency anemia, unspecified: Secondary | ICD-10-CM | POA: Diagnosis not present

## 2012-01-27 DIAGNOSIS — N2581 Secondary hyperparathyroidism of renal origin: Secondary | ICD-10-CM | POA: Diagnosis not present

## 2012-01-27 DIAGNOSIS — N186 End stage renal disease: Secondary | ICD-10-CM | POA: Diagnosis not present

## 2012-01-29 DIAGNOSIS — D509 Iron deficiency anemia, unspecified: Secondary | ICD-10-CM | POA: Diagnosis not present

## 2012-01-29 DIAGNOSIS — D631 Anemia in chronic kidney disease: Secondary | ICD-10-CM | POA: Diagnosis not present

## 2012-01-29 DIAGNOSIS — N186 End stage renal disease: Secondary | ICD-10-CM | POA: Diagnosis not present

## 2012-01-29 DIAGNOSIS — N2581 Secondary hyperparathyroidism of renal origin: Secondary | ICD-10-CM | POA: Diagnosis not present

## 2012-01-31 DIAGNOSIS — N186 End stage renal disease: Secondary | ICD-10-CM | POA: Diagnosis not present

## 2012-02-01 DIAGNOSIS — D509 Iron deficiency anemia, unspecified: Secondary | ICD-10-CM | POA: Diagnosis not present

## 2012-02-01 DIAGNOSIS — Z23 Encounter for immunization: Secondary | ICD-10-CM | POA: Diagnosis not present

## 2012-02-01 DIAGNOSIS — N2581 Secondary hyperparathyroidism of renal origin: Secondary | ICD-10-CM | POA: Diagnosis not present

## 2012-02-01 DIAGNOSIS — N186 End stage renal disease: Secondary | ICD-10-CM | POA: Diagnosis not present

## 2012-02-05 DIAGNOSIS — E119 Type 2 diabetes mellitus without complications: Secondary | ICD-10-CM | POA: Diagnosis not present

## 2012-03-02 DIAGNOSIS — N186 End stage renal disease: Secondary | ICD-10-CM | POA: Diagnosis not present

## 2012-03-04 DIAGNOSIS — N2581 Secondary hyperparathyroidism of renal origin: Secondary | ICD-10-CM | POA: Diagnosis not present

## 2012-03-04 DIAGNOSIS — N186 End stage renal disease: Secondary | ICD-10-CM | POA: Diagnosis not present

## 2012-03-04 DIAGNOSIS — D509 Iron deficiency anemia, unspecified: Secondary | ICD-10-CM | POA: Diagnosis not present

## 2012-03-07 DIAGNOSIS — D509 Iron deficiency anemia, unspecified: Secondary | ICD-10-CM | POA: Diagnosis not present

## 2012-03-07 DIAGNOSIS — N2581 Secondary hyperparathyroidism of renal origin: Secondary | ICD-10-CM | POA: Diagnosis not present

## 2012-03-07 DIAGNOSIS — N186 End stage renal disease: Secondary | ICD-10-CM | POA: Diagnosis not present

## 2012-03-09 DIAGNOSIS — N186 End stage renal disease: Secondary | ICD-10-CM | POA: Diagnosis not present

## 2012-03-09 DIAGNOSIS — E119 Type 2 diabetes mellitus without complications: Secondary | ICD-10-CM | POA: Diagnosis not present

## 2012-03-09 DIAGNOSIS — N2581 Secondary hyperparathyroidism of renal origin: Secondary | ICD-10-CM | POA: Diagnosis not present

## 2012-03-09 DIAGNOSIS — D509 Iron deficiency anemia, unspecified: Secondary | ICD-10-CM | POA: Diagnosis not present

## 2012-03-11 DIAGNOSIS — N2581 Secondary hyperparathyroidism of renal origin: Secondary | ICD-10-CM | POA: Diagnosis not present

## 2012-03-11 DIAGNOSIS — N186 End stage renal disease: Secondary | ICD-10-CM | POA: Diagnosis not present

## 2012-03-11 DIAGNOSIS — D509 Iron deficiency anemia, unspecified: Secondary | ICD-10-CM | POA: Diagnosis not present

## 2012-03-14 DIAGNOSIS — N186 End stage renal disease: Secondary | ICD-10-CM | POA: Diagnosis not present

## 2012-03-14 DIAGNOSIS — D509 Iron deficiency anemia, unspecified: Secondary | ICD-10-CM | POA: Diagnosis not present

## 2012-03-14 DIAGNOSIS — N2581 Secondary hyperparathyroidism of renal origin: Secondary | ICD-10-CM | POA: Diagnosis not present

## 2012-03-16 DIAGNOSIS — N186 End stage renal disease: Secondary | ICD-10-CM | POA: Diagnosis not present

## 2012-03-16 DIAGNOSIS — D509 Iron deficiency anemia, unspecified: Secondary | ICD-10-CM | POA: Diagnosis not present

## 2012-03-16 DIAGNOSIS — N2581 Secondary hyperparathyroidism of renal origin: Secondary | ICD-10-CM | POA: Diagnosis not present

## 2012-03-18 DIAGNOSIS — N2581 Secondary hyperparathyroidism of renal origin: Secondary | ICD-10-CM | POA: Diagnosis not present

## 2012-03-18 DIAGNOSIS — N186 End stage renal disease: Secondary | ICD-10-CM | POA: Diagnosis not present

## 2012-03-18 DIAGNOSIS — D509 Iron deficiency anemia, unspecified: Secondary | ICD-10-CM | POA: Diagnosis not present

## 2012-03-21 DIAGNOSIS — N186 End stage renal disease: Secondary | ICD-10-CM | POA: Diagnosis not present

## 2012-03-21 DIAGNOSIS — D509 Iron deficiency anemia, unspecified: Secondary | ICD-10-CM | POA: Diagnosis not present

## 2012-03-21 DIAGNOSIS — N2581 Secondary hyperparathyroidism of renal origin: Secondary | ICD-10-CM | POA: Diagnosis not present

## 2012-03-23 DIAGNOSIS — N186 End stage renal disease: Secondary | ICD-10-CM | POA: Diagnosis not present

## 2012-03-23 DIAGNOSIS — N2581 Secondary hyperparathyroidism of renal origin: Secondary | ICD-10-CM | POA: Diagnosis not present

## 2012-03-23 DIAGNOSIS — D509 Iron deficiency anemia, unspecified: Secondary | ICD-10-CM | POA: Diagnosis not present

## 2012-03-25 DIAGNOSIS — N186 End stage renal disease: Secondary | ICD-10-CM | POA: Diagnosis not present

## 2012-03-25 DIAGNOSIS — D509 Iron deficiency anemia, unspecified: Secondary | ICD-10-CM | POA: Diagnosis not present

## 2012-03-25 DIAGNOSIS — N2581 Secondary hyperparathyroidism of renal origin: Secondary | ICD-10-CM | POA: Diagnosis not present

## 2012-03-28 DIAGNOSIS — N2581 Secondary hyperparathyroidism of renal origin: Secondary | ICD-10-CM | POA: Diagnosis not present

## 2012-03-28 DIAGNOSIS — N186 End stage renal disease: Secondary | ICD-10-CM | POA: Diagnosis not present

## 2012-03-28 DIAGNOSIS — D509 Iron deficiency anemia, unspecified: Secondary | ICD-10-CM | POA: Diagnosis not present

## 2012-03-30 DIAGNOSIS — N186 End stage renal disease: Secondary | ICD-10-CM | POA: Diagnosis not present

## 2012-03-30 DIAGNOSIS — D509 Iron deficiency anemia, unspecified: Secondary | ICD-10-CM | POA: Diagnosis not present

## 2012-03-30 DIAGNOSIS — N2581 Secondary hyperparathyroidism of renal origin: Secondary | ICD-10-CM | POA: Diagnosis not present

## 2012-04-01 DIAGNOSIS — N2581 Secondary hyperparathyroidism of renal origin: Secondary | ICD-10-CM | POA: Diagnosis not present

## 2012-04-01 DIAGNOSIS — D509 Iron deficiency anemia, unspecified: Secondary | ICD-10-CM | POA: Diagnosis not present

## 2012-04-01 DIAGNOSIS — N186 End stage renal disease: Secondary | ICD-10-CM | POA: Diagnosis not present

## 2012-04-02 DIAGNOSIS — N186 End stage renal disease: Secondary | ICD-10-CM | POA: Diagnosis not present

## 2012-04-04 DIAGNOSIS — N2581 Secondary hyperparathyroidism of renal origin: Secondary | ICD-10-CM | POA: Diagnosis not present

## 2012-04-04 DIAGNOSIS — D509 Iron deficiency anemia, unspecified: Secondary | ICD-10-CM | POA: Diagnosis not present

## 2012-04-04 DIAGNOSIS — Z23 Encounter for immunization: Secondary | ICD-10-CM | POA: Diagnosis not present

## 2012-04-04 DIAGNOSIS — N186 End stage renal disease: Secondary | ICD-10-CM | POA: Diagnosis not present

## 2012-04-06 DIAGNOSIS — E119 Type 2 diabetes mellitus without complications: Secondary | ICD-10-CM | POA: Diagnosis not present

## 2012-04-06 DIAGNOSIS — Z23 Encounter for immunization: Secondary | ICD-10-CM | POA: Diagnosis not present

## 2012-04-06 DIAGNOSIS — N186 End stage renal disease: Secondary | ICD-10-CM | POA: Diagnosis not present

## 2012-04-06 DIAGNOSIS — D509 Iron deficiency anemia, unspecified: Secondary | ICD-10-CM | POA: Diagnosis not present

## 2012-04-06 DIAGNOSIS — N2581 Secondary hyperparathyroidism of renal origin: Secondary | ICD-10-CM | POA: Diagnosis not present

## 2012-04-08 DIAGNOSIS — Z23 Encounter for immunization: Secondary | ICD-10-CM | POA: Diagnosis not present

## 2012-04-08 DIAGNOSIS — N2581 Secondary hyperparathyroidism of renal origin: Secondary | ICD-10-CM | POA: Diagnosis not present

## 2012-04-08 DIAGNOSIS — D509 Iron deficiency anemia, unspecified: Secondary | ICD-10-CM | POA: Diagnosis not present

## 2012-04-08 DIAGNOSIS — N186 End stage renal disease: Secondary | ICD-10-CM | POA: Diagnosis not present

## 2012-04-11 DIAGNOSIS — N186 End stage renal disease: Secondary | ICD-10-CM | POA: Diagnosis not present

## 2012-04-11 DIAGNOSIS — D509 Iron deficiency anemia, unspecified: Secondary | ICD-10-CM | POA: Diagnosis not present

## 2012-04-11 DIAGNOSIS — Z23 Encounter for immunization: Secondary | ICD-10-CM | POA: Diagnosis not present

## 2012-04-11 DIAGNOSIS — N2581 Secondary hyperparathyroidism of renal origin: Secondary | ICD-10-CM | POA: Diagnosis not present

## 2012-04-13 DIAGNOSIS — N186 End stage renal disease: Secondary | ICD-10-CM | POA: Diagnosis not present

## 2012-04-13 DIAGNOSIS — N2581 Secondary hyperparathyroidism of renal origin: Secondary | ICD-10-CM | POA: Diagnosis not present

## 2012-04-13 DIAGNOSIS — Z23 Encounter for immunization: Secondary | ICD-10-CM | POA: Diagnosis not present

## 2012-04-13 DIAGNOSIS — D509 Iron deficiency anemia, unspecified: Secondary | ICD-10-CM | POA: Diagnosis not present

## 2012-04-15 DIAGNOSIS — N186 End stage renal disease: Secondary | ICD-10-CM | POA: Diagnosis not present

## 2012-04-15 DIAGNOSIS — D509 Iron deficiency anemia, unspecified: Secondary | ICD-10-CM | POA: Diagnosis not present

## 2012-04-15 DIAGNOSIS — N2581 Secondary hyperparathyroidism of renal origin: Secondary | ICD-10-CM | POA: Diagnosis not present

## 2012-04-15 DIAGNOSIS — Z23 Encounter for immunization: Secondary | ICD-10-CM | POA: Diagnosis not present

## 2012-04-18 DIAGNOSIS — Z23 Encounter for immunization: Secondary | ICD-10-CM | POA: Diagnosis not present

## 2012-04-18 DIAGNOSIS — D509 Iron deficiency anemia, unspecified: Secondary | ICD-10-CM | POA: Diagnosis not present

## 2012-04-18 DIAGNOSIS — N2581 Secondary hyperparathyroidism of renal origin: Secondary | ICD-10-CM | POA: Diagnosis not present

## 2012-04-18 DIAGNOSIS — N186 End stage renal disease: Secondary | ICD-10-CM | POA: Diagnosis not present

## 2012-04-20 DIAGNOSIS — N186 End stage renal disease: Secondary | ICD-10-CM | POA: Diagnosis not present

## 2012-04-20 DIAGNOSIS — N2581 Secondary hyperparathyroidism of renal origin: Secondary | ICD-10-CM | POA: Diagnosis not present

## 2012-04-20 DIAGNOSIS — D509 Iron deficiency anemia, unspecified: Secondary | ICD-10-CM | POA: Diagnosis not present

## 2012-04-20 DIAGNOSIS — Z23 Encounter for immunization: Secondary | ICD-10-CM | POA: Diagnosis not present

## 2012-04-22 DIAGNOSIS — N2581 Secondary hyperparathyroidism of renal origin: Secondary | ICD-10-CM | POA: Diagnosis not present

## 2012-04-22 DIAGNOSIS — D509 Iron deficiency anemia, unspecified: Secondary | ICD-10-CM | POA: Diagnosis not present

## 2012-04-22 DIAGNOSIS — Z23 Encounter for immunization: Secondary | ICD-10-CM | POA: Diagnosis not present

## 2012-04-22 DIAGNOSIS — N186 End stage renal disease: Secondary | ICD-10-CM | POA: Diagnosis not present

## 2012-04-25 DIAGNOSIS — N186 End stage renal disease: Secondary | ICD-10-CM | POA: Diagnosis not present

## 2012-04-25 DIAGNOSIS — D509 Iron deficiency anemia, unspecified: Secondary | ICD-10-CM | POA: Diagnosis not present

## 2012-04-25 DIAGNOSIS — N2581 Secondary hyperparathyroidism of renal origin: Secondary | ICD-10-CM | POA: Diagnosis not present

## 2012-04-25 DIAGNOSIS — Z23 Encounter for immunization: Secondary | ICD-10-CM | POA: Diagnosis not present

## 2012-04-27 DIAGNOSIS — N2581 Secondary hyperparathyroidism of renal origin: Secondary | ICD-10-CM | POA: Diagnosis not present

## 2012-04-27 DIAGNOSIS — Z23 Encounter for immunization: Secondary | ICD-10-CM | POA: Diagnosis not present

## 2012-04-27 DIAGNOSIS — N186 End stage renal disease: Secondary | ICD-10-CM | POA: Diagnosis not present

## 2012-04-27 DIAGNOSIS — D509 Iron deficiency anemia, unspecified: Secondary | ICD-10-CM | POA: Diagnosis not present

## 2012-04-29 DIAGNOSIS — Z23 Encounter for immunization: Secondary | ICD-10-CM | POA: Diagnosis not present

## 2012-04-29 DIAGNOSIS — N2581 Secondary hyperparathyroidism of renal origin: Secondary | ICD-10-CM | POA: Diagnosis not present

## 2012-04-29 DIAGNOSIS — N186 End stage renal disease: Secondary | ICD-10-CM | POA: Diagnosis not present

## 2012-04-29 DIAGNOSIS — D509 Iron deficiency anemia, unspecified: Secondary | ICD-10-CM | POA: Diagnosis not present

## 2012-05-02 DIAGNOSIS — N186 End stage renal disease: Secondary | ICD-10-CM | POA: Diagnosis not present

## 2012-05-02 DIAGNOSIS — N2581 Secondary hyperparathyroidism of renal origin: Secondary | ICD-10-CM | POA: Diagnosis not present

## 2012-05-02 DIAGNOSIS — Z23 Encounter for immunization: Secondary | ICD-10-CM | POA: Diagnosis not present

## 2012-05-02 DIAGNOSIS — D509 Iron deficiency anemia, unspecified: Secondary | ICD-10-CM | POA: Diagnosis not present

## 2012-05-04 DIAGNOSIS — N2581 Secondary hyperparathyroidism of renal origin: Secondary | ICD-10-CM | POA: Diagnosis not present

## 2012-05-04 DIAGNOSIS — D509 Iron deficiency anemia, unspecified: Secondary | ICD-10-CM | POA: Diagnosis not present

## 2012-05-04 DIAGNOSIS — N186 End stage renal disease: Secondary | ICD-10-CM | POA: Diagnosis not present

## 2012-05-06 DIAGNOSIS — N2581 Secondary hyperparathyroidism of renal origin: Secondary | ICD-10-CM | POA: Diagnosis not present

## 2012-05-06 DIAGNOSIS — D509 Iron deficiency anemia, unspecified: Secondary | ICD-10-CM | POA: Diagnosis not present

## 2012-05-06 DIAGNOSIS — N186 End stage renal disease: Secondary | ICD-10-CM | POA: Diagnosis not present

## 2012-05-09 DIAGNOSIS — N186 End stage renal disease: Secondary | ICD-10-CM | POA: Diagnosis not present

## 2012-05-09 DIAGNOSIS — D509 Iron deficiency anemia, unspecified: Secondary | ICD-10-CM | POA: Diagnosis not present

## 2012-05-09 DIAGNOSIS — N2581 Secondary hyperparathyroidism of renal origin: Secondary | ICD-10-CM | POA: Diagnosis not present

## 2012-05-11 DIAGNOSIS — N2581 Secondary hyperparathyroidism of renal origin: Secondary | ICD-10-CM | POA: Diagnosis not present

## 2012-05-11 DIAGNOSIS — N186 End stage renal disease: Secondary | ICD-10-CM | POA: Diagnosis not present

## 2012-05-11 DIAGNOSIS — D509 Iron deficiency anemia, unspecified: Secondary | ICD-10-CM | POA: Diagnosis not present

## 2012-05-13 DIAGNOSIS — N2581 Secondary hyperparathyroidism of renal origin: Secondary | ICD-10-CM | POA: Diagnosis not present

## 2012-05-13 DIAGNOSIS — D509 Iron deficiency anemia, unspecified: Secondary | ICD-10-CM | POA: Diagnosis not present

## 2012-05-13 DIAGNOSIS — N186 End stage renal disease: Secondary | ICD-10-CM | POA: Diagnosis not present

## 2012-05-16 DIAGNOSIS — N2581 Secondary hyperparathyroidism of renal origin: Secondary | ICD-10-CM | POA: Diagnosis not present

## 2012-05-16 DIAGNOSIS — D509 Iron deficiency anemia, unspecified: Secondary | ICD-10-CM | POA: Diagnosis not present

## 2012-05-16 DIAGNOSIS — N186 End stage renal disease: Secondary | ICD-10-CM | POA: Diagnosis not present

## 2012-05-18 DIAGNOSIS — D509 Iron deficiency anemia, unspecified: Secondary | ICD-10-CM | POA: Diagnosis not present

## 2012-05-18 DIAGNOSIS — N186 End stage renal disease: Secondary | ICD-10-CM | POA: Diagnosis not present

## 2012-05-18 DIAGNOSIS — N2581 Secondary hyperparathyroidism of renal origin: Secondary | ICD-10-CM | POA: Diagnosis not present

## 2012-05-20 DIAGNOSIS — D509 Iron deficiency anemia, unspecified: Secondary | ICD-10-CM | POA: Diagnosis not present

## 2012-05-20 DIAGNOSIS — N2581 Secondary hyperparathyroidism of renal origin: Secondary | ICD-10-CM | POA: Diagnosis not present

## 2012-05-20 DIAGNOSIS — N186 End stage renal disease: Secondary | ICD-10-CM | POA: Diagnosis not present

## 2012-05-23 DIAGNOSIS — N2581 Secondary hyperparathyroidism of renal origin: Secondary | ICD-10-CM | POA: Diagnosis not present

## 2012-05-23 DIAGNOSIS — D509 Iron deficiency anemia, unspecified: Secondary | ICD-10-CM | POA: Diagnosis not present

## 2012-05-23 DIAGNOSIS — N186 End stage renal disease: Secondary | ICD-10-CM | POA: Diagnosis not present

## 2012-05-25 DIAGNOSIS — N186 End stage renal disease: Secondary | ICD-10-CM | POA: Diagnosis not present

## 2012-05-25 DIAGNOSIS — D509 Iron deficiency anemia, unspecified: Secondary | ICD-10-CM | POA: Diagnosis not present

## 2012-05-25 DIAGNOSIS — N2581 Secondary hyperparathyroidism of renal origin: Secondary | ICD-10-CM | POA: Diagnosis not present

## 2012-05-27 DIAGNOSIS — N186 End stage renal disease: Secondary | ICD-10-CM | POA: Diagnosis not present

## 2012-05-27 DIAGNOSIS — D509 Iron deficiency anemia, unspecified: Secondary | ICD-10-CM | POA: Diagnosis not present

## 2012-05-27 DIAGNOSIS — N2581 Secondary hyperparathyroidism of renal origin: Secondary | ICD-10-CM | POA: Diagnosis not present

## 2012-05-30 DIAGNOSIS — N186 End stage renal disease: Secondary | ICD-10-CM | POA: Diagnosis not present

## 2012-05-30 DIAGNOSIS — N2581 Secondary hyperparathyroidism of renal origin: Secondary | ICD-10-CM | POA: Diagnosis not present

## 2012-05-30 DIAGNOSIS — D509 Iron deficiency anemia, unspecified: Secondary | ICD-10-CM | POA: Diagnosis not present

## 2012-06-01 DIAGNOSIS — D509 Iron deficiency anemia, unspecified: Secondary | ICD-10-CM | POA: Diagnosis not present

## 2012-06-01 DIAGNOSIS — N2581 Secondary hyperparathyroidism of renal origin: Secondary | ICD-10-CM | POA: Diagnosis not present

## 2012-06-01 DIAGNOSIS — N186 End stage renal disease: Secondary | ICD-10-CM | POA: Diagnosis not present

## 2012-06-02 DIAGNOSIS — N186 End stage renal disease: Secondary | ICD-10-CM | POA: Diagnosis not present

## 2012-06-03 DIAGNOSIS — D509 Iron deficiency anemia, unspecified: Secondary | ICD-10-CM | POA: Diagnosis not present

## 2012-06-03 DIAGNOSIS — N2581 Secondary hyperparathyroidism of renal origin: Secondary | ICD-10-CM | POA: Diagnosis not present

## 2012-06-03 DIAGNOSIS — N186 End stage renal disease: Secondary | ICD-10-CM | POA: Diagnosis not present

## 2012-06-08 DIAGNOSIS — N186 End stage renal disease: Secondary | ICD-10-CM | POA: Diagnosis not present

## 2012-06-14 DIAGNOSIS — Z125 Encounter for screening for malignant neoplasm of prostate: Secondary | ICD-10-CM | POA: Diagnosis not present

## 2012-06-14 DIAGNOSIS — I10 Essential (primary) hypertension: Secondary | ICD-10-CM | POA: Diagnosis not present

## 2012-06-14 DIAGNOSIS — N186 End stage renal disease: Secondary | ICD-10-CM | POA: Diagnosis not present

## 2012-06-14 DIAGNOSIS — Z0000 Encounter for general adult medical examination without abnormal findings: Secondary | ICD-10-CM | POA: Diagnosis not present

## 2012-06-14 DIAGNOSIS — E1029 Type 1 diabetes mellitus with other diabetic kidney complication: Secondary | ICD-10-CM | POA: Diagnosis not present

## 2012-06-14 DIAGNOSIS — Z6838 Body mass index (BMI) 38.0-38.9, adult: Secondary | ICD-10-CM | POA: Diagnosis not present

## 2012-06-28 DIAGNOSIS — N186 End stage renal disease: Secondary | ICD-10-CM | POA: Diagnosis not present

## 2012-06-28 DIAGNOSIS — T82898A Other specified complication of vascular prosthetic devices, implants and grafts, initial encounter: Secondary | ICD-10-CM | POA: Diagnosis not present

## 2012-06-28 DIAGNOSIS — I871 Compression of vein: Secondary | ICD-10-CM | POA: Diagnosis not present

## 2012-07-02 DIAGNOSIS — N186 End stage renal disease: Secondary | ICD-10-CM | POA: Diagnosis not present

## 2012-07-04 DIAGNOSIS — N186 End stage renal disease: Secondary | ICD-10-CM | POA: Diagnosis not present

## 2012-07-04 DIAGNOSIS — N2581 Secondary hyperparathyroidism of renal origin: Secondary | ICD-10-CM | POA: Diagnosis not present

## 2012-07-04 DIAGNOSIS — D509 Iron deficiency anemia, unspecified: Secondary | ICD-10-CM | POA: Diagnosis not present

## 2012-07-06 DIAGNOSIS — N2581 Secondary hyperparathyroidism of renal origin: Secondary | ICD-10-CM | POA: Diagnosis not present

## 2012-07-06 DIAGNOSIS — N186 End stage renal disease: Secondary | ICD-10-CM | POA: Diagnosis not present

## 2012-07-06 DIAGNOSIS — E119 Type 2 diabetes mellitus without complications: Secondary | ICD-10-CM | POA: Diagnosis not present

## 2012-07-06 DIAGNOSIS — D509 Iron deficiency anemia, unspecified: Secondary | ICD-10-CM | POA: Diagnosis not present

## 2012-07-08 DIAGNOSIS — N186 End stage renal disease: Secondary | ICD-10-CM | POA: Diagnosis not present

## 2012-07-08 DIAGNOSIS — D509 Iron deficiency anemia, unspecified: Secondary | ICD-10-CM | POA: Diagnosis not present

## 2012-07-08 DIAGNOSIS — N2581 Secondary hyperparathyroidism of renal origin: Secondary | ICD-10-CM | POA: Diagnosis not present

## 2012-07-11 DIAGNOSIS — N186 End stage renal disease: Secondary | ICD-10-CM | POA: Diagnosis not present

## 2012-07-11 DIAGNOSIS — D509 Iron deficiency anemia, unspecified: Secondary | ICD-10-CM | POA: Diagnosis not present

## 2012-07-11 DIAGNOSIS — N2581 Secondary hyperparathyroidism of renal origin: Secondary | ICD-10-CM | POA: Diagnosis not present

## 2012-07-12 DIAGNOSIS — T82898A Other specified complication of vascular prosthetic devices, implants and grafts, initial encounter: Secondary | ICD-10-CM | POA: Diagnosis not present

## 2012-07-12 DIAGNOSIS — N186 End stage renal disease: Secondary | ICD-10-CM | POA: Diagnosis not present

## 2012-07-12 DIAGNOSIS — I871 Compression of vein: Secondary | ICD-10-CM | POA: Diagnosis not present

## 2012-07-13 DIAGNOSIS — N2581 Secondary hyperparathyroidism of renal origin: Secondary | ICD-10-CM | POA: Diagnosis not present

## 2012-07-13 DIAGNOSIS — D509 Iron deficiency anemia, unspecified: Secondary | ICD-10-CM | POA: Diagnosis not present

## 2012-07-13 DIAGNOSIS — N186 End stage renal disease: Secondary | ICD-10-CM | POA: Diagnosis not present

## 2012-07-15 DIAGNOSIS — N2581 Secondary hyperparathyroidism of renal origin: Secondary | ICD-10-CM | POA: Diagnosis not present

## 2012-07-15 DIAGNOSIS — N186 End stage renal disease: Secondary | ICD-10-CM | POA: Diagnosis not present

## 2012-07-15 DIAGNOSIS — D509 Iron deficiency anemia, unspecified: Secondary | ICD-10-CM | POA: Diagnosis not present

## 2012-07-18 DIAGNOSIS — N2581 Secondary hyperparathyroidism of renal origin: Secondary | ICD-10-CM | POA: Diagnosis not present

## 2012-07-18 DIAGNOSIS — D509 Iron deficiency anemia, unspecified: Secondary | ICD-10-CM | POA: Diagnosis not present

## 2012-07-18 DIAGNOSIS — N186 End stage renal disease: Secondary | ICD-10-CM | POA: Diagnosis not present

## 2012-07-20 DIAGNOSIS — N186 End stage renal disease: Secondary | ICD-10-CM | POA: Diagnosis not present

## 2012-07-20 DIAGNOSIS — N2581 Secondary hyperparathyroidism of renal origin: Secondary | ICD-10-CM | POA: Diagnosis not present

## 2012-07-20 DIAGNOSIS — D509 Iron deficiency anemia, unspecified: Secondary | ICD-10-CM | POA: Diagnosis not present

## 2012-07-22 DIAGNOSIS — N186 End stage renal disease: Secondary | ICD-10-CM | POA: Diagnosis not present

## 2012-07-22 DIAGNOSIS — D509 Iron deficiency anemia, unspecified: Secondary | ICD-10-CM | POA: Diagnosis not present

## 2012-07-22 DIAGNOSIS — N2581 Secondary hyperparathyroidism of renal origin: Secondary | ICD-10-CM | POA: Diagnosis not present

## 2012-07-24 DIAGNOSIS — N2581 Secondary hyperparathyroidism of renal origin: Secondary | ICD-10-CM | POA: Diagnosis not present

## 2012-07-24 DIAGNOSIS — D509 Iron deficiency anemia, unspecified: Secondary | ICD-10-CM | POA: Diagnosis not present

## 2012-07-24 DIAGNOSIS — N186 End stage renal disease: Secondary | ICD-10-CM | POA: Diagnosis not present

## 2012-07-26 ENCOUNTER — Ambulatory Visit: Payer: Medicare Other | Admitting: Cardiology

## 2012-07-26 DIAGNOSIS — N2581 Secondary hyperparathyroidism of renal origin: Secondary | ICD-10-CM | POA: Diagnosis not present

## 2012-07-26 DIAGNOSIS — D509 Iron deficiency anemia, unspecified: Secondary | ICD-10-CM | POA: Diagnosis not present

## 2012-07-26 DIAGNOSIS — N186 End stage renal disease: Secondary | ICD-10-CM | POA: Diagnosis not present

## 2012-07-29 DIAGNOSIS — N186 End stage renal disease: Secondary | ICD-10-CM | POA: Diagnosis not present

## 2012-07-29 DIAGNOSIS — D509 Iron deficiency anemia, unspecified: Secondary | ICD-10-CM | POA: Diagnosis not present

## 2012-07-29 DIAGNOSIS — N2581 Secondary hyperparathyroidism of renal origin: Secondary | ICD-10-CM | POA: Diagnosis not present

## 2012-08-01 DIAGNOSIS — N2581 Secondary hyperparathyroidism of renal origin: Secondary | ICD-10-CM | POA: Diagnosis not present

## 2012-08-01 DIAGNOSIS — D509 Iron deficiency anemia, unspecified: Secondary | ICD-10-CM | POA: Diagnosis not present

## 2012-08-01 DIAGNOSIS — N186 End stage renal disease: Secondary | ICD-10-CM | POA: Diagnosis not present

## 2012-08-02 DIAGNOSIS — N186 End stage renal disease: Secondary | ICD-10-CM | POA: Diagnosis not present

## 2012-08-03 DIAGNOSIS — N186 End stage renal disease: Secondary | ICD-10-CM | POA: Diagnosis not present

## 2012-08-03 DIAGNOSIS — D509 Iron deficiency anemia, unspecified: Secondary | ICD-10-CM | POA: Diagnosis not present

## 2012-08-03 DIAGNOSIS — N2581 Secondary hyperparathyroidism of renal origin: Secondary | ICD-10-CM | POA: Diagnosis not present

## 2012-08-10 DIAGNOSIS — N186 End stage renal disease: Secondary | ICD-10-CM | POA: Diagnosis not present

## 2012-08-11 DIAGNOSIS — N186 End stage renal disease: Secondary | ICD-10-CM | POA: Diagnosis not present

## 2012-08-11 DIAGNOSIS — T82898A Other specified complication of vascular prosthetic devices, implants and grafts, initial encounter: Secondary | ICD-10-CM | POA: Diagnosis not present

## 2012-08-11 DIAGNOSIS — I871 Compression of vein: Secondary | ICD-10-CM | POA: Diagnosis not present

## 2012-09-01 ENCOUNTER — Ambulatory Visit: Payer: Medicare Other | Admitting: Cardiology

## 2012-09-02 DIAGNOSIS — N186 End stage renal disease: Secondary | ICD-10-CM | POA: Diagnosis not present

## 2012-09-05 DIAGNOSIS — D509 Iron deficiency anemia, unspecified: Secondary | ICD-10-CM | POA: Diagnosis not present

## 2012-09-05 DIAGNOSIS — N2581 Secondary hyperparathyroidism of renal origin: Secondary | ICD-10-CM | POA: Diagnosis not present

## 2012-09-05 DIAGNOSIS — N186 End stage renal disease: Secondary | ICD-10-CM | POA: Diagnosis not present

## 2012-09-06 ENCOUNTER — Encounter: Payer: Self-pay | Admitting: Cardiology

## 2012-09-06 ENCOUNTER — Ambulatory Visit (INDEPENDENT_AMBULATORY_CARE_PROVIDER_SITE_OTHER): Payer: Medicare Other | Admitting: Cardiology

## 2012-09-06 VITALS — BP 150/75 | HR 80 | Ht 74.0 in | Wt 300.0 lb

## 2012-09-06 DIAGNOSIS — Z992 Dependence on renal dialysis: Secondary | ICD-10-CM | POA: Diagnosis not present

## 2012-09-06 DIAGNOSIS — I10 Essential (primary) hypertension: Secondary | ICD-10-CM | POA: Diagnosis not present

## 2012-09-06 DIAGNOSIS — N186 End stage renal disease: Secondary | ICD-10-CM | POA: Diagnosis not present

## 2012-09-06 DIAGNOSIS — I428 Other cardiomyopathies: Secondary | ICD-10-CM

## 2012-09-06 MED ORDER — RAMIPRIL 2.5 MG PO CAPS: 2.5000 mg | ORAL_CAPSULE | Freq: Every day | ORAL | Status: DC

## 2012-09-06 MED ORDER — CARVEDILOL 6.25 MG PO TABS: 6.2500 mg | ORAL_TABLET | Freq: Two times a day (BID) | ORAL | Status: DC

## 2012-09-06 NOTE — Patient Instructions (Addendum)
Your physician recommends that you schedule a follow-up appointment in: 6 months. You will receive a reminder letter in the mail in about 4 months reminding you to call and schedule your appointment. If you don't receive this letter, please contact our office.  Your physician has recommended you make the following change in your medication: finish your current home supply of amlodipine 5 mg, then; start carvedilol 6.25 mg twice daily and ramipril 2.5 mg daily. Your new prescriptions have been sent to your pharmacy. All other medications will remain the same.

## 2012-09-06 NOTE — Assessment & Plan Note (Signed)
Medication adjustments being made as outlined above.

## 2012-09-06 NOTE — Assessment & Plan Note (Signed)
My preference would be to keep him on Coreg and Altace as before, particularly with evidence of normalization of LV systolic function by last echocardiogram. I explained this to the patient. We will have him transition back to Coreg 6.25 mg twice daily and Altace 2.5 mg daily, as he had good blood pressure control this regimen previously as well. I doubt that he will still need to stay on Norvasc.

## 2012-09-06 NOTE — Assessment & Plan Note (Signed)
Continue followup with Dr. Lowanda Foster.

## 2012-09-06 NOTE — Progress Notes (Signed)
Clinical Summary Jacob [name] is a 62 y.o.male presenting for followup. He was seen in June 2013. He reports stable NYHA class II dyspnea, no chest pain. He reports compliance with hemodialysis as before. I note that his medications have changed. He states that he stopped Coreg and Altace, has been placed on Norvasc by Dr. Lowanda Foster. I explained the rationale for continuing certain medical therapies to better address his cardiomyopathy in the setting of hypertension.  Echocardiogram in May 2013 demonstrated severe LVH with LVEF 0000000, grade 1 diastolic dysfunction. This represents significant improvement compared to previous assessment, and he was on beta blocker and ACE inhibitor at that time.   Allergies  Allergen Reactions  . Penicillins     Current Outpatient Prescriptions  Medication Sig Dispense Refill  . aspirin 325 MG tablet Take 325 mg by mouth daily.        . B Complex-C-Folic Acid (DIALYVITE TABLET) TABS Take 1 tablet by mouth as directed.        [name] HYDROcodone-acetaminophen (NORCO/VICODIN) 5-325 MG per tablet Take 1 tablet by mouth Three times daily as needed.      . insulin glargine (LANTUS) 100 UNIT/ML injection Inject into the skin at bedtime.        [name] omeprazole (PRILOSEC) 40 MG capsule Take 40 mg by mouth daily.        . SENSIPAR 30 MG tablet Take 1 tablet by mouth Daily.      . sevelamer (RENVELA) 800 MG tablet Take 1,600 mg by mouth 3 (three) times daily with meals.        . traMADol (ULTRAM) 50 MG tablet Take 1 tablet by mouth Three times a day.      . carvedilol (COREG) 6.25 MG tablet Take 1 tablet (6.25 mg total) by mouth 2 (two) times daily.  180 tablet  3  . ramipril (ALTACE) 2.5 MG capsule Take 1 capsule (2.5 mg total) by mouth daily.  90 capsule  3    Past Medical History  Diagnosis Date  . Nonischemic cardiomyopathy     Hypertension and regular alcohol use, LVEF 40-45%  . End stage renal disease     Hemodialysis on Monday, Wednesday, Friday  . Chronic systolic  heart failure   . Anemia in chronic kidney disease(285.21)   . Type 2 diabetes mellitus   . Anemia of chronic disease   . Essential hypertension, benign   . GERD (gastroesophageal reflux disease)     Social History Mr. Andre reports that he has never smoked. He has never used smokeless tobacco. Mr. Mubarak reports that he drinks alcohol.  Review of Systems No palpitations or syncope. No exertional chest pain. Stable appetite. No orthopnea.  Physical Examination Filed Vitals:   09/06/12 0827  BP: 150/75  Pulse: 80   Filed Weights   09/06/12 0827  Weight: 300 lb (136.079 kg)    Obese male in no acute distress.  HEENT: Conjunctiva and lids normal, oropharynx with moist mucosa, poor dentition.  Neck: Supple, no elevated JVP, possible left carotid bruit.  Lungs: Decreased but clear overall, nonlabored.  Cardiac: Indistinct PMI, regular rate and rhythm, no S3, soft systolic murmur.  Abdomen: Nontender, bowel sounds present, no hepatomegaly.  Musculoskeletal: No kyphosis.  Extremities: No distal pitting edema. AV fistula noted in the left forearm with palpable thrill.    Problem List and Plan   Nonischemic cardiomyopathy My preference would be to keep him on Coreg and Altace as before, particularly with evidence of normalization of  LV systolic function by last echocardiogram. I explained this to the patient. We will have him transition back to Coreg 6.25 mg twice daily and Altace 2.5 mg daily, as he had good blood pressure control this regimen previously as well. I doubt that he will still need to stay on Norvasc.  Essential hypertension, benign Medication adjustments being made as outlined above.  End-stage renal disease on hemodialysis Continue followup with Dr. Lowanda Foster.    Satira Sark, M.D., F.A.C.C.

## 2012-09-07 DIAGNOSIS — N186 End stage renal disease: Secondary | ICD-10-CM | POA: Diagnosis not present

## 2012-10-03 DIAGNOSIS — N2581 Secondary hyperparathyroidism of renal origin: Secondary | ICD-10-CM | POA: Diagnosis not present

## 2012-10-03 DIAGNOSIS — N186 End stage renal disease: Secondary | ICD-10-CM | POA: Diagnosis not present

## 2012-10-03 DIAGNOSIS — Z992 Dependence on renal dialysis: Secondary | ICD-10-CM | POA: Diagnosis not present

## 2012-10-03 DIAGNOSIS — D509 Iron deficiency anemia, unspecified: Secondary | ICD-10-CM | POA: Diagnosis not present

## 2012-10-05 DIAGNOSIS — N186 End stage renal disease: Secondary | ICD-10-CM | POA: Diagnosis not present

## 2012-10-31 DIAGNOSIS — N186 End stage renal disease: Secondary | ICD-10-CM | POA: Diagnosis not present

## 2012-11-02 DIAGNOSIS — D509 Iron deficiency anemia, unspecified: Secondary | ICD-10-CM | POA: Diagnosis not present

## 2012-11-02 DIAGNOSIS — N2581 Secondary hyperparathyroidism of renal origin: Secondary | ICD-10-CM | POA: Diagnosis not present

## 2012-11-02 DIAGNOSIS — N186 End stage renal disease: Secondary | ICD-10-CM | POA: Diagnosis not present

## 2012-11-04 DIAGNOSIS — D509 Iron deficiency anemia, unspecified: Secondary | ICD-10-CM | POA: Diagnosis not present

## 2012-11-04 DIAGNOSIS — N2581 Secondary hyperparathyroidism of renal origin: Secondary | ICD-10-CM | POA: Diagnosis not present

## 2012-11-04 DIAGNOSIS — N186 End stage renal disease: Secondary | ICD-10-CM | POA: Diagnosis not present

## 2012-11-07 DIAGNOSIS — D509 Iron deficiency anemia, unspecified: Secondary | ICD-10-CM | POA: Diagnosis not present

## 2012-11-07 DIAGNOSIS — N186 End stage renal disease: Secondary | ICD-10-CM | POA: Diagnosis not present

## 2012-11-07 DIAGNOSIS — N2581 Secondary hyperparathyroidism of renal origin: Secondary | ICD-10-CM | POA: Diagnosis not present

## 2012-11-09 DIAGNOSIS — N2581 Secondary hyperparathyroidism of renal origin: Secondary | ICD-10-CM | POA: Diagnosis not present

## 2012-11-09 DIAGNOSIS — N186 End stage renal disease: Secondary | ICD-10-CM | POA: Diagnosis not present

## 2012-11-09 DIAGNOSIS — D509 Iron deficiency anemia, unspecified: Secondary | ICD-10-CM | POA: Diagnosis not present

## 2012-11-11 DIAGNOSIS — D509 Iron deficiency anemia, unspecified: Secondary | ICD-10-CM | POA: Diagnosis not present

## 2012-11-11 DIAGNOSIS — N2581 Secondary hyperparathyroidism of renal origin: Secondary | ICD-10-CM | POA: Diagnosis not present

## 2012-11-11 DIAGNOSIS — N186 End stage renal disease: Secondary | ICD-10-CM | POA: Diagnosis not present

## 2012-11-14 ENCOUNTER — Telehealth: Payer: Self-pay | Admitting: *Deleted

## 2012-11-14 DIAGNOSIS — Z01810 Encounter for preprocedural cardiovascular examination: Secondary | ICD-10-CM

## 2012-11-14 DIAGNOSIS — N186 End stage renal disease: Secondary | ICD-10-CM | POA: Diagnosis not present

## 2012-11-14 DIAGNOSIS — N2581 Secondary hyperparathyroidism of renal origin: Secondary | ICD-10-CM | POA: Diagnosis not present

## 2012-11-14 DIAGNOSIS — D509 Iron deficiency anemia, unspecified: Secondary | ICD-10-CM | POA: Diagnosis not present

## 2012-11-14 DIAGNOSIS — I428 Other cardiomyopathies: Secondary | ICD-10-CM

## 2012-11-15 ENCOUNTER — Encounter: Payer: Self-pay | Admitting: *Deleted

## 2012-11-15 NOTE — Telephone Encounter (Signed)
Patient informed that he needed lexiscan and echo per Dr. Domenic Polite and a f/u appointment for pre op clearance.

## 2012-11-16 ENCOUNTER — Telehealth: Payer: Self-pay | Admitting: Cardiology

## 2012-11-16 ENCOUNTER — Other Ambulatory Visit: Payer: Self-pay | Admitting: *Deleted

## 2012-11-16 DIAGNOSIS — D509 Iron deficiency anemia, unspecified: Secondary | ICD-10-CM | POA: Diagnosis not present

## 2012-11-16 DIAGNOSIS — I428 Other cardiomyopathies: Secondary | ICD-10-CM

## 2012-11-16 DIAGNOSIS — N2581 Secondary hyperparathyroidism of renal origin: Secondary | ICD-10-CM | POA: Diagnosis not present

## 2012-11-16 DIAGNOSIS — N186 End stage renal disease: Secondary | ICD-10-CM | POA: Diagnosis not present

## 2012-11-16 DIAGNOSIS — Z01810 Encounter for preprocedural cardiovascular examination: Secondary | ICD-10-CM

## 2012-11-16 NOTE — Telephone Encounter (Signed)
Left message asking Jacob [name] to call office to set up test.

## 2012-11-18 DIAGNOSIS — N2581 Secondary hyperparathyroidism of renal origin: Secondary | ICD-10-CM | POA: Diagnosis not present

## 2012-11-18 DIAGNOSIS — D509 Iron deficiency anemia, unspecified: Secondary | ICD-10-CM | POA: Diagnosis not present

## 2012-11-18 DIAGNOSIS — N186 End stage renal disease: Secondary | ICD-10-CM | POA: Diagnosis not present

## 2012-11-21 DIAGNOSIS — D509 Iron deficiency anemia, unspecified: Secondary | ICD-10-CM | POA: Diagnosis not present

## 2012-11-21 DIAGNOSIS — N186 End stage renal disease: Secondary | ICD-10-CM | POA: Diagnosis not present

## 2012-11-21 DIAGNOSIS — N2581 Secondary hyperparathyroidism of renal origin: Secondary | ICD-10-CM | POA: Diagnosis not present

## 2012-11-23 DIAGNOSIS — N186 End stage renal disease: Secondary | ICD-10-CM | POA: Diagnosis not present

## 2012-11-23 DIAGNOSIS — D509 Iron deficiency anemia, unspecified: Secondary | ICD-10-CM | POA: Diagnosis not present

## 2012-11-23 DIAGNOSIS — N2581 Secondary hyperparathyroidism of renal origin: Secondary | ICD-10-CM | POA: Diagnosis not present

## 2012-11-24 ENCOUNTER — Other Ambulatory Visit: Payer: Self-pay

## 2012-11-24 ENCOUNTER — Other Ambulatory Visit (INDEPENDENT_AMBULATORY_CARE_PROVIDER_SITE_OTHER): Payer: Medicare Other

## 2012-11-24 DIAGNOSIS — I428 Other cardiomyopathies: Secondary | ICD-10-CM | POA: Diagnosis not present

## 2012-11-24 DIAGNOSIS — Z01810 Encounter for preprocedural cardiovascular examination: Secondary | ICD-10-CM

## 2012-11-25 DIAGNOSIS — N2581 Secondary hyperparathyroidism of renal origin: Secondary | ICD-10-CM | POA: Diagnosis not present

## 2012-11-25 DIAGNOSIS — D509 Iron deficiency anemia, unspecified: Secondary | ICD-10-CM | POA: Diagnosis not present

## 2012-11-25 DIAGNOSIS — N186 End stage renal disease: Secondary | ICD-10-CM | POA: Diagnosis not present

## 2012-11-28 DIAGNOSIS — N186 End stage renal disease: Secondary | ICD-10-CM | POA: Diagnosis not present

## 2012-11-28 DIAGNOSIS — N2581 Secondary hyperparathyroidism of renal origin: Secondary | ICD-10-CM | POA: Diagnosis not present

## 2012-11-28 DIAGNOSIS — D509 Iron deficiency anemia, unspecified: Secondary | ICD-10-CM | POA: Diagnosis not present

## 2012-11-29 ENCOUNTER — Telehealth: Payer: Self-pay | Admitting: *Deleted

## 2012-11-29 DIAGNOSIS — I10 Essential (primary) hypertension: Secondary | ICD-10-CM | POA: Diagnosis not present

## 2012-11-29 DIAGNOSIS — Z01810 Encounter for preprocedural cardiovascular examination: Secondary | ICD-10-CM | POA: Diagnosis not present

## 2012-11-29 DIAGNOSIS — I428 Other cardiomyopathies: Secondary | ICD-10-CM | POA: Diagnosis not present

## 2012-11-29 NOTE — Telephone Encounter (Signed)
Patient informed via voicemail.

## 2012-11-29 NOTE — Telephone Encounter (Signed)
Message copied by Merlene Laughter on Tue Nov 29, 2012 10:25 AM ------      Message from: MCDOWELL, Aloha Gell      Created: Thu Nov 24, 2012  1:22 PM       Reviewed. LV function remains stable, LVEF 50-60%. Await results of Cardiolite. ------

## 2012-11-30 DIAGNOSIS — N2581 Secondary hyperparathyroidism of renal origin: Secondary | ICD-10-CM | POA: Diagnosis not present

## 2012-11-30 DIAGNOSIS — N186 End stage renal disease: Secondary | ICD-10-CM | POA: Diagnosis not present

## 2012-11-30 DIAGNOSIS — D509 Iron deficiency anemia, unspecified: Secondary | ICD-10-CM | POA: Diagnosis not present

## 2012-12-02 ENCOUNTER — Telehealth: Payer: Self-pay | Admitting: *Deleted

## 2012-12-02 DIAGNOSIS — D509 Iron deficiency anemia, unspecified: Secondary | ICD-10-CM | POA: Diagnosis not present

## 2012-12-02 DIAGNOSIS — N2581 Secondary hyperparathyroidism of renal origin: Secondary | ICD-10-CM | POA: Diagnosis not present

## 2012-12-02 DIAGNOSIS — N186 End stage renal disease: Secondary | ICD-10-CM | POA: Diagnosis not present

## 2012-12-02 NOTE — Telephone Encounter (Signed)
Message copied by Merlene Laughter on Fri Dec 02, 2012 10:05 AM ------      Message from: MCDOWELL, Aloha Gell      Created: Wed Nov 30, 2012 11:31 AM       Reviewed. Overall low-risk study with preserved LVEF. Doubt that any further ischemic testing will be needed for transplant evaluation. Can review further with him in the office. ------

## 2012-12-02 NOTE — Telephone Encounter (Signed)
Patient informed via voicemail.

## 2012-12-05 DIAGNOSIS — N186 End stage renal disease: Secondary | ICD-10-CM | POA: Diagnosis not present

## 2012-12-05 DIAGNOSIS — D509 Iron deficiency anemia, unspecified: Secondary | ICD-10-CM | POA: Diagnosis not present

## 2012-12-05 DIAGNOSIS — N2581 Secondary hyperparathyroidism of renal origin: Secondary | ICD-10-CM | POA: Diagnosis not present

## 2012-12-07 DIAGNOSIS — D509 Iron deficiency anemia, unspecified: Secondary | ICD-10-CM | POA: Diagnosis not present

## 2012-12-07 DIAGNOSIS — N2581 Secondary hyperparathyroidism of renal origin: Secondary | ICD-10-CM | POA: Diagnosis not present

## 2012-12-07 DIAGNOSIS — N186 End stage renal disease: Secondary | ICD-10-CM | POA: Diagnosis not present

## 2012-12-09 DIAGNOSIS — D509 Iron deficiency anemia, unspecified: Secondary | ICD-10-CM | POA: Diagnosis not present

## 2012-12-09 DIAGNOSIS — N2581 Secondary hyperparathyroidism of renal origin: Secondary | ICD-10-CM | POA: Diagnosis not present

## 2012-12-09 DIAGNOSIS — N186 End stage renal disease: Secondary | ICD-10-CM | POA: Diagnosis not present

## 2012-12-12 DIAGNOSIS — N186 End stage renal disease: Secondary | ICD-10-CM | POA: Diagnosis not present

## 2012-12-12 DIAGNOSIS — N2581 Secondary hyperparathyroidism of renal origin: Secondary | ICD-10-CM | POA: Diagnosis not present

## 2012-12-12 DIAGNOSIS — D509 Iron deficiency anemia, unspecified: Secondary | ICD-10-CM | POA: Diagnosis not present

## 2012-12-14 DIAGNOSIS — N2581 Secondary hyperparathyroidism of renal origin: Secondary | ICD-10-CM | POA: Diagnosis not present

## 2012-12-14 DIAGNOSIS — D509 Iron deficiency anemia, unspecified: Secondary | ICD-10-CM | POA: Diagnosis not present

## 2012-12-14 DIAGNOSIS — N186 End stage renal disease: Secondary | ICD-10-CM | POA: Diagnosis not present

## 2012-12-16 DIAGNOSIS — N2581 Secondary hyperparathyroidism of renal origin: Secondary | ICD-10-CM | POA: Diagnosis not present

## 2012-12-16 DIAGNOSIS — D509 Iron deficiency anemia, unspecified: Secondary | ICD-10-CM | POA: Diagnosis not present

## 2012-12-16 DIAGNOSIS — N186 End stage renal disease: Secondary | ICD-10-CM | POA: Diagnosis not present

## 2012-12-19 DIAGNOSIS — N186 End stage renal disease: Secondary | ICD-10-CM | POA: Diagnosis not present

## 2012-12-19 DIAGNOSIS — D509 Iron deficiency anemia, unspecified: Secondary | ICD-10-CM | POA: Diagnosis not present

## 2012-12-19 DIAGNOSIS — N2581 Secondary hyperparathyroidism of renal origin: Secondary | ICD-10-CM | POA: Diagnosis not present

## 2012-12-21 DIAGNOSIS — N2581 Secondary hyperparathyroidism of renal origin: Secondary | ICD-10-CM | POA: Diagnosis not present

## 2012-12-21 DIAGNOSIS — D509 Iron deficiency anemia, unspecified: Secondary | ICD-10-CM | POA: Diagnosis not present

## 2012-12-21 DIAGNOSIS — N186 End stage renal disease: Secondary | ICD-10-CM | POA: Diagnosis not present

## 2012-12-23 DIAGNOSIS — N186 End stage renal disease: Secondary | ICD-10-CM | POA: Diagnosis not present

## 2012-12-23 DIAGNOSIS — D509 Iron deficiency anemia, unspecified: Secondary | ICD-10-CM | POA: Diagnosis not present

## 2012-12-23 DIAGNOSIS — N2581 Secondary hyperparathyroidism of renal origin: Secondary | ICD-10-CM | POA: Diagnosis not present

## 2012-12-26 DIAGNOSIS — N186 End stage renal disease: Secondary | ICD-10-CM | POA: Diagnosis not present

## 2012-12-26 DIAGNOSIS — D509 Iron deficiency anemia, unspecified: Secondary | ICD-10-CM | POA: Diagnosis not present

## 2012-12-26 DIAGNOSIS — N2581 Secondary hyperparathyroidism of renal origin: Secondary | ICD-10-CM | POA: Diagnosis not present

## 2012-12-28 ENCOUNTER — Encounter: Payer: Self-pay | Admitting: Cardiology

## 2012-12-28 DIAGNOSIS — Z01810 Encounter for preprocedural cardiovascular examination: Secondary | ICD-10-CM | POA: Insufficient documentation

## 2012-12-28 DIAGNOSIS — D509 Iron deficiency anemia, unspecified: Secondary | ICD-10-CM | POA: Diagnosis not present

## 2012-12-28 DIAGNOSIS — N186 End stage renal disease: Secondary | ICD-10-CM | POA: Diagnosis not present

## 2012-12-28 DIAGNOSIS — N2581 Secondary hyperparathyroidism of renal origin: Secondary | ICD-10-CM | POA: Diagnosis not present

## 2012-12-29 ENCOUNTER — Ambulatory Visit (INDEPENDENT_AMBULATORY_CARE_PROVIDER_SITE_OTHER): Payer: Medicare Other | Admitting: Cardiology

## 2012-12-29 ENCOUNTER — Encounter: Payer: Self-pay | Admitting: Cardiology

## 2012-12-29 VITALS — BP 157/72 | HR 72 | Ht 74.0 in | Wt 304.0 lb

## 2012-12-29 DIAGNOSIS — Z992 Dependence on renal dialysis: Secondary | ICD-10-CM | POA: Diagnosis not present

## 2012-12-29 DIAGNOSIS — Z01810 Encounter for preprocedural cardiovascular examination: Secondary | ICD-10-CM

## 2012-12-29 DIAGNOSIS — N186 End stage renal disease: Secondary | ICD-10-CM | POA: Diagnosis not present

## 2012-12-29 DIAGNOSIS — I428 Other cardiomyopathies: Secondary | ICD-10-CM

## 2012-12-29 NOTE — Patient Instructions (Addendum)

## 2012-12-29 NOTE — Progress Notes (Signed)
Clinical Summary Jacob [name] is a 62 y.o.male presenting for office followup and preoperative evaluation. He is being evaluated for possible renal transplantation through [facility]. He reports no unusual shortness of breath, no chest pain symptoms. States that he is tolerating hemodialysis, also indicates compliance with his current medications. We referred him for followup testing in anticipation of this visit.  Echocardiogram on 4/24 revealed moderate LVH with LVEF 0000000, grade 1 diastolic dysfunction, trivial mitral regurgitation, mild left atrial enlargement. Lexiscan Cardiolite on 4/30 showed no diagnostic ST segment changes, overall low risk perfusion study with small, partially reversible lateral apical defect possibly reflecting variable soft tissue attenuation, less likely a small region of ischemia. LVEF was 53% by that study. I reviewed these with him again today.  Would overall consider his cardiac perioperative risk to be in the mild to moderate range, all things considered, and doubt that he would need to pursue any further cardiac testing prior to being considered for renal transplantation. He will obviously need to remain compliant with medical therapy, continue to undergo dose titration as tolerated, and maintained regular followup.   Allergies  Allergen Reactions  . Penicillins     Current Outpatient Prescriptions  Medication Sig Dispense Refill  . aspirin 325 MG tablet Take 325 mg by mouth daily.        . B Complex-C-Folic Acid (DIALYVITE TABLET) TABS Take 1 tablet by mouth as directed.        . carvedilol (COREG) 6.25 MG tablet Take 1 tablet (6.25 mg total) by mouth 2 (two) times daily.  180 tablet  3  . HYDROcodone-acetaminophen (NORCO/VICODIN) 5-325 MG per tablet Take 1 tablet by mouth Three times daily as needed.      . insulin glargine (LANTUS) 100 UNIT/ML injection Inject into the skin at bedtime.        [name] omeprazole (PRILOSEC) 40 MG capsule  Take 40 mg by mouth daily.        . ramipril (ALTACE) 2.5 MG capsule Take 1 capsule (2.5 mg total) by mouth daily.  90 capsule  3  . SENSIPAR 30 MG tablet Take 1 tablet by mouth Daily.      . sevelamer (RENVELA) 800 MG tablet Take 1,600 mg by mouth 3 (three) times daily with meals.        . traMADol (ULTRAM) 50 MG tablet Take 1 tablet by mouth Three times a day.       No current facility-administered medications for this visit.    Past Medical History  Diagnosis Date  . Nonischemic cardiomyopathy     Hypertension and regular alcohol use, LVEF 40-45%  . End stage renal disease     Hemodialysis on Monday, Wednesday, Friday  . Chronic systolic heart failure   . Anemia in chronic kidney disease(285.21)   . Type 2 diabetes mellitus   . Anemia of chronic disease   . Essential hypertension, benign   . GERD (gastroesophageal reflux disease)     Social History Jacob [name] reports that he has never smoked. He has never used smokeless tobacco. Jacob [name] reports that  drinks alcohol.  Review of Systems No palpitations or syncope. No unusual bleeding problems. Appetite stable. Weight is up 4 pounds from the last visit.  Physical Examination Filed Vitals:   12/29/12 1333  BP: 157/72  Pulse: 72   Filed Weights   12/29/12 1333  Weight: 304 lb (137.893 kg)    Obese male in no acute distress.  HEENT: Conjunctiva  and lids normal, oropharynx with moist mucosa, poor dentition.  Neck: Supple, no elevated JVP, possible left carotid bruit (prior documentation of mild nonobstructive bilateral ICA disease). Lungs: Decreased but clear overall, nonlabored.  Cardiac: Indistinct PMI, regular rate and rhythm, no S3, soft systolic murmur.  Abdomen: Nontender, bowel sounds present, no hepatomegaly.  Musculoskeletal: No kyphosis.  Extremities: No distal pitting edema. AV fistula noted in the left forearm with palpable thrill.  Skin: Warm and dry. Neuropsychiatric: Alert and oriented x3, affect  appropriate.   Problem List and Plan   Preoperative cardiovascular examination Cardiac testing reviewed above. Overall patient is at mild to moderate perioperative risk from a cardiac perspective, he should not require further cardiac testing now in anticipation of renal transplantation evaluation. Certainly if symptoms change this may need to be revisited, however for now would continue medical therapy and will arrange subsequent followup.  Nonischemic cardiomyopathy Improved on medical therapy, recent followup LVEF 55-60% with grade 1 diastolic dysfunction.  End-stage renal disease on hemodialysis Continues on regular hemodialysis.    Satira Sark, M.D., F.A.C.C.

## 2012-12-29 NOTE — Assessment & Plan Note (Signed)
Cardiac testing reviewed above. Overall patient is at mild to moderate perioperative risk from a cardiac perspective, he should not require further cardiac testing now in anticipation of renal transplantation evaluation. Certainly if symptoms change this may need to be revisited, however for now would continue medical therapy and will arrange subsequent followup.

## 2012-12-29 NOTE — Assessment & Plan Note (Signed)
Continues on regular hemodialysis.

## 2012-12-29 NOTE — Assessment & Plan Note (Signed)
Improved on medical therapy, recent followup LVEF 55-60% with grade 1 diastolic dysfunction.

## 2012-12-30 DIAGNOSIS — N186 End stage renal disease: Secondary | ICD-10-CM | POA: Diagnosis not present

## 2012-12-30 DIAGNOSIS — N2581 Secondary hyperparathyroidism of renal origin: Secondary | ICD-10-CM | POA: Diagnosis not present

## 2012-12-30 DIAGNOSIS — D509 Iron deficiency anemia, unspecified: Secondary | ICD-10-CM | POA: Diagnosis not present

## 2012-12-31 DIAGNOSIS — N186 End stage renal disease: Secondary | ICD-10-CM | POA: Diagnosis not present

## 2013-01-02 DIAGNOSIS — N2581 Secondary hyperparathyroidism of renal origin: Secondary | ICD-10-CM | POA: Diagnosis not present

## 2013-01-02 DIAGNOSIS — D509 Iron deficiency anemia, unspecified: Secondary | ICD-10-CM | POA: Diagnosis not present

## 2013-01-02 DIAGNOSIS — N186 End stage renal disease: Secondary | ICD-10-CM | POA: Diagnosis not present

## 2013-01-03 DIAGNOSIS — N186 End stage renal disease: Secondary | ICD-10-CM | POA: Diagnosis not present

## 2013-01-03 DIAGNOSIS — I871 Compression of vein: Secondary | ICD-10-CM | POA: Diagnosis not present

## 2013-01-03 DIAGNOSIS — T82898A Other specified complication of vascular prosthetic devices, implants and grafts, initial encounter: Secondary | ICD-10-CM | POA: Diagnosis not present

## 2013-01-04 DIAGNOSIS — E119 Type 2 diabetes mellitus without complications: Secondary | ICD-10-CM | POA: Diagnosis not present

## 2013-01-30 DIAGNOSIS — N186 End stage renal disease: Secondary | ICD-10-CM | POA: Diagnosis not present

## 2013-02-01 DIAGNOSIS — N186 End stage renal disease: Secondary | ICD-10-CM | POA: Diagnosis not present

## 2013-02-01 DIAGNOSIS — D509 Iron deficiency anemia, unspecified: Secondary | ICD-10-CM | POA: Diagnosis not present

## 2013-02-01 DIAGNOSIS — N2581 Secondary hyperparathyroidism of renal origin: Secondary | ICD-10-CM | POA: Diagnosis not present

## 2013-02-08 DIAGNOSIS — N186 End stage renal disease: Secondary | ICD-10-CM | POA: Diagnosis not present

## 2013-02-08 DIAGNOSIS — Z992 Dependence on renal dialysis: Secondary | ICD-10-CM | POA: Diagnosis not present

## 2013-02-09 DIAGNOSIS — N186 End stage renal disease: Secondary | ICD-10-CM | POA: Diagnosis not present

## 2013-02-09 DIAGNOSIS — T82898A Other specified complication of vascular prosthetic devices, implants and grafts, initial encounter: Secondary | ICD-10-CM | POA: Diagnosis not present

## 2013-02-09 DIAGNOSIS — I871 Compression of vein: Secondary | ICD-10-CM | POA: Diagnosis not present

## 2013-03-02 DIAGNOSIS — N186 End stage renal disease: Secondary | ICD-10-CM | POA: Diagnosis not present

## 2013-03-03 DIAGNOSIS — N186 End stage renal disease: Secondary | ICD-10-CM | POA: Diagnosis not present

## 2013-03-03 DIAGNOSIS — N2581 Secondary hyperparathyroidism of renal origin: Secondary | ICD-10-CM | POA: Diagnosis not present

## 2013-03-03 DIAGNOSIS — D509 Iron deficiency anemia, unspecified: Secondary | ICD-10-CM | POA: Diagnosis not present

## 2013-03-06 DIAGNOSIS — N2581 Secondary hyperparathyroidism of renal origin: Secondary | ICD-10-CM | POA: Diagnosis not present

## 2013-03-06 DIAGNOSIS — D509 Iron deficiency anemia, unspecified: Secondary | ICD-10-CM | POA: Diagnosis not present

## 2013-03-06 DIAGNOSIS — N186 End stage renal disease: Secondary | ICD-10-CM | POA: Diagnosis not present

## 2013-03-08 DIAGNOSIS — D509 Iron deficiency anemia, unspecified: Secondary | ICD-10-CM | POA: Diagnosis not present

## 2013-03-08 DIAGNOSIS — N2581 Secondary hyperparathyroidism of renal origin: Secondary | ICD-10-CM | POA: Diagnosis not present

## 2013-03-08 DIAGNOSIS — Z992 Dependence on renal dialysis: Secondary | ICD-10-CM | POA: Diagnosis not present

## 2013-03-08 DIAGNOSIS — N186 End stage renal disease: Secondary | ICD-10-CM | POA: Diagnosis not present

## 2013-03-10 DIAGNOSIS — N186 End stage renal disease: Secondary | ICD-10-CM | POA: Diagnosis not present

## 2013-03-10 DIAGNOSIS — N2581 Secondary hyperparathyroidism of renal origin: Secondary | ICD-10-CM | POA: Diagnosis not present

## 2013-03-10 DIAGNOSIS — D509 Iron deficiency anemia, unspecified: Secondary | ICD-10-CM | POA: Diagnosis not present

## 2013-03-13 DIAGNOSIS — N2581 Secondary hyperparathyroidism of renal origin: Secondary | ICD-10-CM | POA: Diagnosis not present

## 2013-03-13 DIAGNOSIS — D509 Iron deficiency anemia, unspecified: Secondary | ICD-10-CM | POA: Diagnosis not present

## 2013-03-13 DIAGNOSIS — N186 End stage renal disease: Secondary | ICD-10-CM | POA: Diagnosis not present

## 2013-03-15 DIAGNOSIS — N186 End stage renal disease: Secondary | ICD-10-CM | POA: Diagnosis not present

## 2013-03-15 DIAGNOSIS — N2581 Secondary hyperparathyroidism of renal origin: Secondary | ICD-10-CM | POA: Diagnosis not present

## 2013-03-15 DIAGNOSIS — D509 Iron deficiency anemia, unspecified: Secondary | ICD-10-CM | POA: Diagnosis not present

## 2013-03-17 DIAGNOSIS — D509 Iron deficiency anemia, unspecified: Secondary | ICD-10-CM | POA: Diagnosis not present

## 2013-03-17 DIAGNOSIS — N186 End stage renal disease: Secondary | ICD-10-CM | POA: Diagnosis not present

## 2013-03-17 DIAGNOSIS — N2581 Secondary hyperparathyroidism of renal origin: Secondary | ICD-10-CM | POA: Diagnosis not present

## 2013-03-20 DIAGNOSIS — D509 Iron deficiency anemia, unspecified: Secondary | ICD-10-CM | POA: Diagnosis not present

## 2013-03-20 DIAGNOSIS — N2581 Secondary hyperparathyroidism of renal origin: Secondary | ICD-10-CM | POA: Diagnosis not present

## 2013-03-20 DIAGNOSIS — N186 End stage renal disease: Secondary | ICD-10-CM | POA: Diagnosis not present

## 2013-03-22 DIAGNOSIS — D509 Iron deficiency anemia, unspecified: Secondary | ICD-10-CM | POA: Diagnosis not present

## 2013-03-22 DIAGNOSIS — N2581 Secondary hyperparathyroidism of renal origin: Secondary | ICD-10-CM | POA: Diagnosis not present

## 2013-03-22 DIAGNOSIS — N186 End stage renal disease: Secondary | ICD-10-CM | POA: Diagnosis not present

## 2013-03-24 DIAGNOSIS — N2581 Secondary hyperparathyroidism of renal origin: Secondary | ICD-10-CM | POA: Diagnosis not present

## 2013-03-24 DIAGNOSIS — D509 Iron deficiency anemia, unspecified: Secondary | ICD-10-CM | POA: Diagnosis not present

## 2013-03-24 DIAGNOSIS — N186 End stage renal disease: Secondary | ICD-10-CM | POA: Diagnosis not present

## 2013-03-27 DIAGNOSIS — D509 Iron deficiency anemia, unspecified: Secondary | ICD-10-CM | POA: Diagnosis not present

## 2013-03-27 DIAGNOSIS — N186 End stage renal disease: Secondary | ICD-10-CM | POA: Diagnosis not present

## 2013-03-27 DIAGNOSIS — N2581 Secondary hyperparathyroidism of renal origin: Secondary | ICD-10-CM | POA: Diagnosis not present

## 2013-03-29 DIAGNOSIS — D509 Iron deficiency anemia, unspecified: Secondary | ICD-10-CM | POA: Diagnosis not present

## 2013-03-29 DIAGNOSIS — N186 End stage renal disease: Secondary | ICD-10-CM | POA: Diagnosis not present

## 2013-03-29 DIAGNOSIS — N2581 Secondary hyperparathyroidism of renal origin: Secondary | ICD-10-CM | POA: Diagnosis not present

## 2013-03-31 DIAGNOSIS — D509 Iron deficiency anemia, unspecified: Secondary | ICD-10-CM | POA: Diagnosis not present

## 2013-03-31 DIAGNOSIS — N2581 Secondary hyperparathyroidism of renal origin: Secondary | ICD-10-CM | POA: Diagnosis not present

## 2013-03-31 DIAGNOSIS — N186 End stage renal disease: Secondary | ICD-10-CM | POA: Diagnosis not present

## 2013-04-02 DIAGNOSIS — N186 End stage renal disease: Secondary | ICD-10-CM | POA: Diagnosis not present

## 2013-04-03 DIAGNOSIS — T82898A Other specified complication of vascular prosthetic devices, implants and grafts, initial encounter: Secondary | ICD-10-CM | POA: Diagnosis not present

## 2013-04-03 DIAGNOSIS — D509 Iron deficiency anemia, unspecified: Secondary | ICD-10-CM | POA: Diagnosis not present

## 2013-04-03 DIAGNOSIS — Z23 Encounter for immunization: Secondary | ICD-10-CM | POA: Diagnosis not present

## 2013-04-03 DIAGNOSIS — N186 End stage renal disease: Secondary | ICD-10-CM | POA: Diagnosis not present

## 2013-04-03 DIAGNOSIS — N2581 Secondary hyperparathyroidism of renal origin: Secondary | ICD-10-CM | POA: Diagnosis not present

## 2013-04-05 DIAGNOSIS — Z992 Dependence on renal dialysis: Secondary | ICD-10-CM | POA: Diagnosis not present

## 2013-04-05 DIAGNOSIS — D509 Iron deficiency anemia, unspecified: Secondary | ICD-10-CM | POA: Diagnosis not present

## 2013-04-05 DIAGNOSIS — N2581 Secondary hyperparathyroidism of renal origin: Secondary | ICD-10-CM | POA: Diagnosis not present

## 2013-04-05 DIAGNOSIS — E119 Type 2 diabetes mellitus without complications: Secondary | ICD-10-CM | POA: Diagnosis not present

## 2013-04-05 DIAGNOSIS — T82898A Other specified complication of vascular prosthetic devices, implants and grafts, initial encounter: Secondary | ICD-10-CM | POA: Diagnosis not present

## 2013-04-05 DIAGNOSIS — Z23 Encounter for immunization: Secondary | ICD-10-CM | POA: Diagnosis not present

## 2013-04-05 DIAGNOSIS — N186 End stage renal disease: Secondary | ICD-10-CM | POA: Diagnosis not present

## 2013-04-07 DIAGNOSIS — N186 End stage renal disease: Secondary | ICD-10-CM | POA: Diagnosis not present

## 2013-04-07 DIAGNOSIS — N2581 Secondary hyperparathyroidism of renal origin: Secondary | ICD-10-CM | POA: Diagnosis not present

## 2013-04-07 DIAGNOSIS — D509 Iron deficiency anemia, unspecified: Secondary | ICD-10-CM | POA: Diagnosis not present

## 2013-04-07 DIAGNOSIS — T82898A Other specified complication of vascular prosthetic devices, implants and grafts, initial encounter: Secondary | ICD-10-CM | POA: Diagnosis not present

## 2013-04-07 DIAGNOSIS — Z23 Encounter for immunization: Secondary | ICD-10-CM | POA: Diagnosis not present

## 2013-04-10 DIAGNOSIS — T82898A Other specified complication of vascular prosthetic devices, implants and grafts, initial encounter: Secondary | ICD-10-CM | POA: Diagnosis not present

## 2013-04-10 DIAGNOSIS — D509 Iron deficiency anemia, unspecified: Secondary | ICD-10-CM | POA: Diagnosis not present

## 2013-04-10 DIAGNOSIS — Z23 Encounter for immunization: Secondary | ICD-10-CM | POA: Diagnosis not present

## 2013-04-10 DIAGNOSIS — N186 End stage renal disease: Secondary | ICD-10-CM | POA: Diagnosis not present

## 2013-04-10 DIAGNOSIS — N2581 Secondary hyperparathyroidism of renal origin: Secondary | ICD-10-CM | POA: Diagnosis not present

## 2013-04-12 DIAGNOSIS — Z23 Encounter for immunization: Secondary | ICD-10-CM | POA: Diagnosis not present

## 2013-04-12 DIAGNOSIS — D509 Iron deficiency anemia, unspecified: Secondary | ICD-10-CM | POA: Diagnosis not present

## 2013-04-12 DIAGNOSIS — N186 End stage renal disease: Secondary | ICD-10-CM | POA: Diagnosis not present

## 2013-04-12 DIAGNOSIS — T82898A Other specified complication of vascular prosthetic devices, implants and grafts, initial encounter: Secondary | ICD-10-CM | POA: Diagnosis not present

## 2013-04-12 DIAGNOSIS — N2581 Secondary hyperparathyroidism of renal origin: Secondary | ICD-10-CM | POA: Diagnosis not present

## 2013-04-14 DIAGNOSIS — N2581 Secondary hyperparathyroidism of renal origin: Secondary | ICD-10-CM | POA: Diagnosis not present

## 2013-04-14 DIAGNOSIS — Z23 Encounter for immunization: Secondary | ICD-10-CM | POA: Diagnosis not present

## 2013-04-14 DIAGNOSIS — T82898A Other specified complication of vascular prosthetic devices, implants and grafts, initial encounter: Secondary | ICD-10-CM | POA: Diagnosis not present

## 2013-04-14 DIAGNOSIS — N186 End stage renal disease: Secondary | ICD-10-CM | POA: Diagnosis not present

## 2013-04-14 DIAGNOSIS — D509 Iron deficiency anemia, unspecified: Secondary | ICD-10-CM | POA: Diagnosis not present

## 2013-04-17 DIAGNOSIS — N2581 Secondary hyperparathyroidism of renal origin: Secondary | ICD-10-CM | POA: Diagnosis not present

## 2013-04-17 DIAGNOSIS — N186 End stage renal disease: Secondary | ICD-10-CM | POA: Diagnosis not present

## 2013-04-17 DIAGNOSIS — D509 Iron deficiency anemia, unspecified: Secondary | ICD-10-CM | POA: Diagnosis not present

## 2013-04-17 DIAGNOSIS — Z23 Encounter for immunization: Secondary | ICD-10-CM | POA: Diagnosis not present

## 2013-04-17 DIAGNOSIS — T82898A Other specified complication of vascular prosthetic devices, implants and grafts, initial encounter: Secondary | ICD-10-CM | POA: Diagnosis not present

## 2013-04-18 DIAGNOSIS — T63461A Toxic effect of venom of wasps, accidental (unintentional), initial encounter: Secondary | ICD-10-CM | POA: Diagnosis not present

## 2013-04-18 DIAGNOSIS — Z992 Dependence on renal dialysis: Secondary | ICD-10-CM | POA: Diagnosis not present

## 2013-04-18 DIAGNOSIS — Z79899 Other long term (current) drug therapy: Secondary | ICD-10-CM | POA: Diagnosis not present

## 2013-04-18 DIAGNOSIS — T782XXA Anaphylactic shock, unspecified, initial encounter: Secondary | ICD-10-CM | POA: Diagnosis not present

## 2013-04-18 DIAGNOSIS — T6391XA Toxic effect of contact with unspecified venomous animal, accidental (unintentional), initial encounter: Secondary | ICD-10-CM | POA: Diagnosis not present

## 2013-04-18 DIAGNOSIS — E119 Type 2 diabetes mellitus without complications: Secondary | ICD-10-CM | POA: Diagnosis not present

## 2013-04-19 DIAGNOSIS — D509 Iron deficiency anemia, unspecified: Secondary | ICD-10-CM | POA: Diagnosis not present

## 2013-04-19 DIAGNOSIS — N2581 Secondary hyperparathyroidism of renal origin: Secondary | ICD-10-CM | POA: Diagnosis not present

## 2013-04-19 DIAGNOSIS — Z23 Encounter for immunization: Secondary | ICD-10-CM | POA: Diagnosis not present

## 2013-04-19 DIAGNOSIS — T82898A Other specified complication of vascular prosthetic devices, implants and grafts, initial encounter: Secondary | ICD-10-CM | POA: Diagnosis not present

## 2013-04-19 DIAGNOSIS — N186 End stage renal disease: Secondary | ICD-10-CM | POA: Diagnosis not present

## 2013-04-21 DIAGNOSIS — D509 Iron deficiency anemia, unspecified: Secondary | ICD-10-CM | POA: Diagnosis not present

## 2013-04-21 DIAGNOSIS — N186 End stage renal disease: Secondary | ICD-10-CM | POA: Diagnosis not present

## 2013-04-21 DIAGNOSIS — T82898A Other specified complication of vascular prosthetic devices, implants and grafts, initial encounter: Secondary | ICD-10-CM | POA: Diagnosis not present

## 2013-04-21 DIAGNOSIS — N2581 Secondary hyperparathyroidism of renal origin: Secondary | ICD-10-CM | POA: Diagnosis not present

## 2013-04-21 DIAGNOSIS — Z23 Encounter for immunization: Secondary | ICD-10-CM | POA: Diagnosis not present

## 2013-04-24 DIAGNOSIS — Z23 Encounter for immunization: Secondary | ICD-10-CM | POA: Diagnosis not present

## 2013-04-24 DIAGNOSIS — N186 End stage renal disease: Secondary | ICD-10-CM | POA: Diagnosis not present

## 2013-04-24 DIAGNOSIS — D509 Iron deficiency anemia, unspecified: Secondary | ICD-10-CM | POA: Diagnosis not present

## 2013-04-24 DIAGNOSIS — N2581 Secondary hyperparathyroidism of renal origin: Secondary | ICD-10-CM | POA: Diagnosis not present

## 2013-04-24 DIAGNOSIS — T82898A Other specified complication of vascular prosthetic devices, implants and grafts, initial encounter: Secondary | ICD-10-CM | POA: Diagnosis not present

## 2013-04-25 DIAGNOSIS — I871 Compression of vein: Secondary | ICD-10-CM | POA: Diagnosis not present

## 2013-04-25 DIAGNOSIS — N186 End stage renal disease: Secondary | ICD-10-CM | POA: Diagnosis not present

## 2013-04-25 DIAGNOSIS — T82898A Other specified complication of vascular prosthetic devices, implants and grafts, initial encounter: Secondary | ICD-10-CM | POA: Diagnosis not present

## 2013-04-26 DIAGNOSIS — N186 End stage renal disease: Secondary | ICD-10-CM | POA: Diagnosis not present

## 2013-04-26 DIAGNOSIS — Z23 Encounter for immunization: Secondary | ICD-10-CM | POA: Diagnosis not present

## 2013-04-26 DIAGNOSIS — D509 Iron deficiency anemia, unspecified: Secondary | ICD-10-CM | POA: Diagnosis not present

## 2013-04-26 DIAGNOSIS — N2581 Secondary hyperparathyroidism of renal origin: Secondary | ICD-10-CM | POA: Diagnosis not present

## 2013-04-26 DIAGNOSIS — T82898A Other specified complication of vascular prosthetic devices, implants and grafts, initial encounter: Secondary | ICD-10-CM | POA: Diagnosis not present

## 2013-04-27 ENCOUNTER — Encounter: Payer: Self-pay | Admitting: Vascular Surgery

## 2013-04-28 ENCOUNTER — Ambulatory Visit (INDEPENDENT_AMBULATORY_CARE_PROVIDER_SITE_OTHER): Payer: Medicare Other | Admitting: Vascular Surgery

## 2013-04-28 ENCOUNTER — Encounter: Payer: Self-pay | Admitting: Vascular Surgery

## 2013-04-28 ENCOUNTER — Ambulatory Visit (HOSPITAL_COMMUNITY)
Admission: RE | Admit: 2013-04-28 | Discharge: 2013-04-28 | Disposition: A | Discharge status: Home / Self Care | Payer: Medicare Other | Source: Ambulatory Visit | Attending: Vascular Surgery | Admitting: Vascular Surgery

## 2013-04-28 ENCOUNTER — Other Ambulatory Visit: Payer: Self-pay

## 2013-04-28 VITALS — BP 137/70 | HR 80 | Temp 97.8°F | Ht 74.0 in | Wt 308.0 lb

## 2013-04-28 DIAGNOSIS — I82B19 Acute embolism and thrombosis of unspecified subclavian vein: Secondary | ICD-10-CM | POA: Diagnosis not present

## 2013-04-28 DIAGNOSIS — Z01810 Encounter for preprocedural cardiovascular examination: Secondary | ICD-10-CM | POA: Diagnosis not present

## 2013-04-28 DIAGNOSIS — T82898A Other specified complication of vascular prosthetic devices, implants and grafts, initial encounter: Secondary | ICD-10-CM | POA: Insufficient documentation

## 2013-04-28 DIAGNOSIS — N185 Chronic kidney disease, stage 5: Secondary | ICD-10-CM

## 2013-04-28 DIAGNOSIS — I82B12 Acute embolism and thrombosis of left subclavian vein: Secondary | ICD-10-CM

## 2013-04-28 NOTE — Progress Notes (Signed)
[facility]  Referred by:  Halford Chessman, MD [id number] [place], [place] [address]  Reason for referral: New access  History of Present Illness  Jacob [name] is a 62 y.o. (1950/10/29) male who presents for evaluation left radiocephalic arteriovenous fistula.  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  The patient is right hand dominant.  Since then the patient has developed L arm swelling over the last week.  He denies any neuro sx currently but has difficulty using the L hand due to swelling.  His only access to date has been the L RC AVF placed by an outside practice over 5 years ago.  His only TDC placement was a [facility] TDC.  Past Medical History  Diagnosis Date  . Nonischemic cardiomyopathy     Hypertension and regular alcohol use, LVEF 40-45%  . End stage renal disease     Hemodialysis on Monday, Wednesday, Friday  . Chronic systolic heart failure   . Anemia in chronic kidney disease(285.21)   . Type 2 diabetes mellitus   . Anemia of chronic disease   . Essential hypertension, benign   . GERD (gastroesophageal reflux disease)   . CHF (congestive heart failure)     Past Surgical History  Procedure Laterality Date  . Lumbar spine surgery      History   Social History  . Marital Status: Divorced    Spouse Name: N/A    Number of Children: N/A  . Years of Education: N/A   Occupational History  . Not on file.   Social History Main Topics  . Smoking status: Never Smoker   . Smokeless tobacco: Never Used  . Alcohol Use: 1.8 oz/week    3 Shots of liquor per week     Comment: Regular use for years, reports at least a pint per week  . Drug Use: No  . Sexual Activity: Not on file   Other Topics Concern  . Not on file   Social History Narrative  . No narrative on file    Family History  Problem Relation Age of Onset  . Heart attack  Brother     76  . Heart disease Brother     before age 50  . Hypertension Brother   . Heart attack Brother     35  . Heart attack Brother     26  . Diabetes Mother   . Hypertension Mother   . Heart disease Father   . Hypertension Father   . Other Father     amputation  . Hypertension Sister   . Vision loss Maternal Uncle    Current Outpatient Prescriptions on File Prior to Visit  Medication Sig Dispense Refill  . aspirin 325 MG tablet Take 325 mg by mouth daily.        . B Complex-C-Folic Acid (DIALYVITE TABLET) TABS Take 1 tablet by mouth as directed.        . carvedilol (COREG) 6.25 MG tablet Take 1 tablet (6.25 mg total) by mouth 2 (two) times daily.  180 tablet  3  . clopidogrel (PLAVIX) 75 MG tablet Take 75 mg by mouth daily.      [name] HYDROcodone-acetaminophen (NORCO/VICODIN) 5-325 MG per tablet Take 1 tablet by mouth Three times daily as needed.      . insulin glargine (LANTUS) 100 UNIT/ML injection Inject into the  skin at bedtime.        . Lanthanum Carbonate (FOSRENOL PO) Take by mouth. 4 tablets with each meal and 1 with snacks      . omeprazole (PRILOSEC) 40 MG capsule Take 40 mg by mouth daily.        . ramipril (ALTACE) 2.5 MG capsule Take 1 capsule (2.5 mg total) by mouth daily.  90 capsule  3  . SENSIPAR 30 MG tablet Take 1 tablet by mouth Daily.      . sevelamer (RENVELA) 800 MG tablet Take 1,600 mg by mouth 3 (three) times daily with meals.        . traMADol (ULTRAM) 50 MG tablet Take 1 tablet by mouth Three times a day.       No current facility-administered medications on file prior to visit.    Allergies  Allergen Reactions  . Penicillins     REVIEW OF SYSTEMS:  (Positives checked otherwise negative)  CARDIOVASCULAR:  []  chest pain, []  chest pressure, []  palpitations, []  shortness of breath when laying flat, []  shortness of breath with exertion,  []  pain in feet when walking, []  pain in feet when laying flat, []  history of blood clot in veins (DVT), []   history of phlebitis, [x]  swelling in arm, []  varicose veins  PULMONARY:  []  productive cough, []  asthma, []  wheezing  NEUROLOGIC:  []  weakness in arms or legs, []  numbness in arms or legs, []  difficulty speaking or slurred speech, []  temporary loss of vision in one eye, []  dizziness  HEMATOLOGIC:  []  bleeding problems, []  problems with blood clotting too easily  MUSCULOSKEL:  []  joint pain, []  joint swelling  GASTROINTEST:  []  vomiting blood, []  blood in stool     GENITOURINARY:  []  burning with urination, []  blood in urine  PSYCHIATRIC:  []  history of major depression  INTEGUMENTARY:  []  rashes, []  ulcers  CONSTITUTIONAL:  []  fever, []  chills  Physical Examination  Filed Vitals:   04/28/13 1000  BP: 137/70  Pulse: 80  Temp: 97.8 F (36.6 C)  TempSrc: Oral  Height: 6\' 2"  (1.88 m)  Weight: 308 lb (139.708 kg)  SpO2: 98%   Body mass index is 39.53 kg/(m^2).  General: A&O x 3, WD, Morbidly Obese,   Head: [place]/AT  Ear/Nose/Throat: Hearing grossly intact, nares w/o erythema or drainage, oropharynx w/o Erythema/Exudate, Mallampati score: 3  Eyes: PERRLA, EOMI  Neck: Supple, no nuchal rigidity, no palpable LAD, large neck  Pulmonary: Sym exp, good air movt, CTAB, no rales, rhonchi, & wheezing, mild chest vein enlargement  Cardiac: RRR, Nl S1, S2, no Murmurs, rubs or gallops  Vascular: Vessel Right Left  Radial Palpable Faintly Palpable  Ulnar Faintly Palpable Not Palpable  Brachial Palpable Faintly Palpable  Carotid Palpable, without bruit Palpable, without bruit  Aorta Not palpable N/A  Femoral Palpable Palpable  Popliteal Not palpable Not palpable  PT Not Palpable Not Palpable  DP Palpable Palpable   Gastrointestinal: soft, NTND, -G/R, - HSM, - masses, - CVAT B  Musculoskeletal: M/S 5/5 throughout except L hand due to swelling, Extremities without ischemic changes , L hand 3+ swelling Neurologic: CN 2-12 intact , Pain and light touch intact in extremities ,  Motor exam as listed above  Psychiatric: Judgment intact, Mood & affect appropriate for pt's clinical situation  Dermatologic: See M/S exam for extremity exam, no rashes otherwise noted  Lymph : No Cervical, Axillary, or Inguinal lymphadenopathy   Non-Invasive Vascular Imaging  Vein Mapping  (Date: 04/28/2013):  R arm: acceptable vein conduits include entire R cephalic vein, upper arm basilic vein  Outside Studies/Documentation 5 pages of outside documents were reviewed including: outside access center report.  Medical Decision Making  PAXTIN YAX is a 62 y.o. 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    Obvious this was not successful as evident by the swelling due to L arm venous outflow stenosis, likely due to occluded stent.  Usually venous TOS is treated with venoplasty and anticoagulation followed by TOS decompression.  No vascular surgeon in the [place] does TOS decompression, with the closest surgeon doing TOS at [facility].  In my opinion, I suspect he is not a candidate for vascular reconstruction in this situation with an occluded stent in the outflow tract.   I recommend: ligation of L RC AVF, compressive therapy and L arm elevation, central venogram via both IJV, placement of TDC , and placement of R arm fistula if possible.  Based on vein mapping and examination, this patient's permanent access options include: R RC vs BC AVF, R BVT.   I would start with the R RC vs BC AVF.  I had an extensive discussion with this patient in regards to the nature of access surgery, including risk, benefits, and alternatives.    The patient is aware that the risks of access surgery include but are not limited to: bleeding, infection, steal  syndrome, nerve damage, ischemic monomelic neuropathy, failure of access to mature, and possible need for additional access procedures in the future.  The patient has agreed to proceed with the above procedure which will be scheduled 2 OCT 14 in the hybrid OR.[name], MD [facility] [place] Office: [phone] Pager: [phone]  04/28/2013, 10:32 AM

## 2013-04-29 DIAGNOSIS — T82898A Other specified complication of vascular prosthetic devices, implants and grafts, initial encounter: Secondary | ICD-10-CM | POA: Diagnosis not present

## 2013-04-29 DIAGNOSIS — N2581 Secondary hyperparathyroidism of renal origin: Secondary | ICD-10-CM | POA: Diagnosis not present

## 2013-04-29 DIAGNOSIS — N186 End stage renal disease: Secondary | ICD-10-CM | POA: Diagnosis not present

## 2013-04-29 DIAGNOSIS — D509 Iron deficiency anemia, unspecified: Secondary | ICD-10-CM | POA: Diagnosis not present

## 2013-04-29 DIAGNOSIS — Z23 Encounter for immunization: Secondary | ICD-10-CM | POA: Diagnosis not present

## 2013-05-01 ENCOUNTER — Encounter: Payer: Self-pay | Admitting: Nephrology

## 2013-05-01 DIAGNOSIS — N186 End stage renal disease: Secondary | ICD-10-CM | POA: Diagnosis not present

## 2013-05-01 DIAGNOSIS — D509 Iron deficiency anemia, unspecified: Secondary | ICD-10-CM | POA: Diagnosis not present

## 2013-05-01 DIAGNOSIS — T82898A Other specified complication of vascular prosthetic devices, implants and grafts, initial encounter: Secondary | ICD-10-CM | POA: Diagnosis not present

## 2013-05-01 DIAGNOSIS — N2581 Secondary hyperparathyroidism of renal origin: Secondary | ICD-10-CM | POA: Diagnosis not present

## 2013-05-01 DIAGNOSIS — Z23 Encounter for immunization: Secondary | ICD-10-CM | POA: Diagnosis not present

## 2013-05-02 ENCOUNTER — Encounter (HOSPITAL_COMMUNITY): Payer: Self-pay | Admitting: *Deleted

## 2013-05-02 DIAGNOSIS — N186 End stage renal disease: Secondary | ICD-10-CM | POA: Diagnosis not present

## 2013-05-02 NOTE — Progress Notes (Signed)
05/02/13 1914  OBSTRUCTIVE SLEEP APNEA  Have you ever been diagnosed with sleep apnea through a sleep study? No  Do you snore loudly (loud enough to be heard through closed doors)?  0  Do you often feel tired, fatigued, or sleepy during the daytime? 0  Has anyone observed you stop breathing during your sleep? 0  Do you have, or are you being treated for high blood pressure? 1  BMI more than 35 kg/m2? 1  Age over 63 years old? 1  Neck circumference greater than 40 cm/18 inches? 0  Gender: 1  Obstructive Sleep Apnea Score 4

## 2013-05-03 DIAGNOSIS — N186 End stage renal disease: Secondary | ICD-10-CM | POA: Diagnosis not present

## 2013-05-03 DIAGNOSIS — N2581 Secondary hyperparathyroidism of renal origin: Secondary | ICD-10-CM | POA: Diagnosis not present

## 2013-05-03 DIAGNOSIS — D509 Iron deficiency anemia, unspecified: Secondary | ICD-10-CM | POA: Diagnosis not present

## 2013-05-03 MED ORDER — VANCOMYCIN HCL IN DEXTROSE 1-5 GM/200ML-% IV SOLN
1000.0000 mg | INTRAVENOUS | Status: AC
  Administered 2013-05-04: 1000 mg via INTRAVENOUS
  Filled 2013-05-03: qty 200

## 2013-05-03 MED ORDER — VANCOMYCIN HCL IN DEXTROSE 1-5 GM/200ML-% IV SOLN: 1000.0000 mg | INTRAVENOUS | Status: DC

## 2013-05-04 ENCOUNTER — Telehealth: Payer: Self-pay | Admitting: Vascular Surgery

## 2013-05-04 ENCOUNTER — Encounter (HOSPITAL_COMMUNITY): Payer: Self-pay | Admitting: Anesthesiology

## 2013-05-04 ENCOUNTER — Ambulatory Visit (HOSPITAL_COMMUNITY): Payer: Medicare Other

## 2013-05-04 ENCOUNTER — Encounter (HOSPITAL_COMMUNITY)
Admission: RE | Disposition: A | Discharge status: Home / Self Care | Payer: Self-pay | Source: Ambulatory Visit | Attending: Vascular Surgery

## 2013-05-04 ENCOUNTER — Ambulatory Visit (HOSPITAL_COMMUNITY)
Admission: RE | Admit: 2013-05-04 | Discharge: 2013-05-04 | Disposition: A | Discharge status: Home / Self Care | Payer: Medicare Other | Source: Ambulatory Visit | Attending: Vascular Surgery | Admitting: Vascular Surgery

## 2013-05-04 ENCOUNTER — Ambulatory Visit (HOSPITAL_COMMUNITY): Payer: Medicare Other | Admitting: Anesthesiology

## 2013-05-04 DIAGNOSIS — K219 Gastro-esophageal reflux disease without esophagitis: Secondary | ICD-10-CM | POA: Insufficient documentation

## 2013-05-04 DIAGNOSIS — M7989 Other specified soft tissue disorders: Secondary | ICD-10-CM | POA: Diagnosis not present

## 2013-05-04 DIAGNOSIS — R079 Chest pain, unspecified: Secondary | ICD-10-CM | POA: Diagnosis not present

## 2013-05-04 DIAGNOSIS — Z992 Dependence on renal dialysis: Secondary | ICD-10-CM | POA: Diagnosis not present

## 2013-05-04 DIAGNOSIS — D631 Anemia in chronic kidney disease: Secondary | ICD-10-CM | POA: Diagnosis not present

## 2013-05-04 DIAGNOSIS — T82898A Other specified complication of vascular prosthetic devices, implants and grafts, initial encounter: Secondary | ICD-10-CM | POA: Diagnosis not present

## 2013-05-04 DIAGNOSIS — N186 End stage renal disease: Secondary | ICD-10-CM | POA: Insufficient documentation

## 2013-05-04 DIAGNOSIS — Y841 Kidney dialysis as the cause of abnormal reaction of the patient, or of later complication, without mention of misadventure at the time of the procedure: Secondary | ICD-10-CM | POA: Diagnosis not present

## 2013-05-04 DIAGNOSIS — I82B19 Acute embolism and thrombosis of unspecified subclavian vein: Secondary | ICD-10-CM | POA: Diagnosis not present

## 2013-05-04 DIAGNOSIS — Y832 Surgical operation with anastomosis, bypass or graft as the cause of abnormal reaction of the patient, or of later complication, without mention of misadventure at the time of the procedure: Secondary | ICD-10-CM | POA: Insufficient documentation

## 2013-05-04 DIAGNOSIS — I120 Hypertensive chronic kidney disease with stage 5 chronic kidney disease or end stage renal disease: Secondary | ICD-10-CM | POA: Insufficient documentation

## 2013-05-04 DIAGNOSIS — Z452 Encounter for adjustment and management of vascular access device: Secondary | ICD-10-CM | POA: Diagnosis not present

## 2013-05-04 DIAGNOSIS — I10 Essential (primary) hypertension: Secondary | ICD-10-CM | POA: Diagnosis not present

## 2013-05-04 DIAGNOSIS — I509 Heart failure, unspecified: Secondary | ICD-10-CM | POA: Diagnosis not present

## 2013-05-04 DIAGNOSIS — Z01818 Encounter for other preprocedural examination: Secondary | ICD-10-CM | POA: Diagnosis not present

## 2013-05-04 HISTORY — PX: FISTULOGRAM: SHX5832

## 2013-05-04 HISTORY — PX: LIGATION OF ARTERIOVENOUS  FISTULA: SHX5948

## 2013-05-04 HISTORY — DX: Unspecified glaucoma: H40.9

## 2013-05-04 HISTORY — PX: INSERTION OF DIALYSIS CATHETER: SHX1324

## 2013-05-04 HISTORY — PX: AV FISTULA PLACEMENT: SHX1204

## 2013-05-04 LAB — GLUCOSE, CAPILLARY

## 2013-05-04 SURGERY — LIGATION OF ARTERIOVENOUS  FISTULA
Anesthesia: General | Site: Chest | Laterality: Right | Wound class: Clean

## 2013-05-04 MED ORDER — HEPARIN SODIUM (PORCINE) 1000 UNIT/ML IJ SOLN
INTRAMUSCULAR | Status: AC
  Filled 2013-05-04: qty 1

## 2013-05-04 MED ORDER — SUCCINYLCHOLINE CHLORIDE 20 MG/ML IJ SOLN
INTRAMUSCULAR | Status: DC | PRN
  Administered 2013-05-04: 100 mg via INTRAVENOUS

## 2013-05-04 MED ORDER — ARTIFICIAL TEARS OP OINT
TOPICAL_OINTMENT | OPHTHALMIC | Status: DC | PRN
  Administered 2013-05-04: 1 via OPHTHALMIC

## 2013-05-04 MED ORDER — THROMBIN 20000 UNITS EX SOLR
CUTANEOUS | Status: DC | PRN
  Administered 2013-05-04: 11:00:00 via TOPICAL

## 2013-05-04 MED ORDER — MIDAZOLAM HCL 5 MG/5ML IJ SOLN
INTRAMUSCULAR | Status: DC | PRN
  Administered 2013-05-04: 1 mg via INTRAVENOUS

## 2013-05-04 MED ORDER — 0.9 % SODIUM CHLORIDE (POUR BTL) OPTIME
TOPICAL | Status: DC | PRN
  Administered 2013-05-04: 1000 mL

## 2013-05-04 MED ORDER — IOHEXOL 300 MG/ML  SOLN
INTRAMUSCULAR | Status: DC | PRN
  Administered 2013-05-04: 50 mL via INTRAVENOUS

## 2013-05-04 MED ORDER — EPHEDRINE SULFATE 50 MG/ML IJ SOLN
INTRAMUSCULAR | Status: DC | PRN
  Administered 2013-05-04: 10 mg via INTRAVENOUS
  Administered 2013-05-04: 5 mg via INTRAVENOUS

## 2013-05-04 MED ORDER — FENTANYL CITRATE 0.05 MG/ML IJ SOLN
INTRAMUSCULAR | Status: DC | PRN
  Administered 2013-05-04 (×5): 50 ug via INTRAVENOUS

## 2013-05-04 MED ORDER — OXYCODONE-ACETAMINOPHEN 5-325 MG PO TABS: 1.0000 | ORAL_TABLET | Freq: Four times a day (QID) | ORAL | Status: DC | PRN

## 2013-05-04 MED ORDER — NEOSTIGMINE METHYLSULFATE 1 MG/ML IJ SOLN
INTRAMUSCULAR | Status: DC | PRN
  Administered 2013-05-04: 4 mg via INTRAVENOUS

## 2013-05-04 MED ORDER — HYDROMORPHONE HCL PF 1 MG/ML IJ SOLN: 0.2500 mg | INTRAMUSCULAR | Status: DC | PRN

## 2013-05-04 MED ORDER — GLYCOPYRROLATE 0.2 MG/ML IJ SOLN
INTRAMUSCULAR | Status: DC | PRN
  Administered 2013-05-04: 0.6 mg via INTRAVENOUS
  Administered 2013-05-04: 0.2 mg via INTRAVENOUS

## 2013-05-04 MED ORDER — ROCURONIUM BROMIDE 100 MG/10ML IV SOLN
INTRAVENOUS | Status: DC | PRN
  Administered 2013-05-04: 40 mg via INTRAVENOUS
  Administered 2013-05-04: 10 mg via INTRAVENOUS

## 2013-05-04 MED ORDER — PROPOFOL 10 MG/ML IV BOLUS
INTRAVENOUS | Status: DC | PRN
  Administered 2013-05-04: 140 mg via INTRAVENOUS

## 2013-05-04 MED ORDER — PHENYLEPHRINE HCL 10 MG/ML IJ SOLN
10.0000 mg | INTRAVENOUS | Status: DC | PRN
  Administered 2013-05-04: 50 ug/min via INTRAVENOUS

## 2013-05-04 MED ORDER — LIDOCAINE-EPINEPHRINE (PF) 1 %-1:200000 IJ SOLN
INTRAMUSCULAR | Status: AC
  Filled 2013-05-04: qty 10

## 2013-05-04 MED ORDER — LIDOCAINE HCL (CARDIAC) 20 MG/ML IV SOLN
INTRAVENOUS | Status: DC | PRN
  Administered 2013-05-04: 60 mg via INTRAVENOUS

## 2013-05-04 MED ORDER — SODIUM CHLORIDE 0.9 % IV SOLN
INTRAVENOUS | Status: DC
  Administered 2013-05-04: 07:00:00 via INTRAVENOUS

## 2013-05-04 MED ORDER — SODIUM CHLORIDE 0.9 % IR SOLN
Status: DC | PRN
  Administered 2013-05-04: 08:00:00

## 2013-05-04 MED ORDER — ONDANSETRON HCL 4 MG/2ML IJ SOLN
INTRAMUSCULAR | Status: DC | PRN
  Administered 2013-05-04: 4 mg via INTRAVENOUS

## 2013-05-04 MED ORDER — HEPARIN SODIUM (PORCINE) 1000 UNIT/ML IJ SOLN
INTRAMUSCULAR | Status: DC | PRN
  Administered 2013-05-04: 10 mL

## 2013-05-04 MED ORDER — THROMBIN 20000 UNITS EX SOLR
CUTANEOUS | Status: AC
  Filled 2013-05-04: qty 20000

## 2013-05-04 SURGICAL SUPPLY — 83 items
ADH SKN CLS APL DERMABOND .7 (GAUZE/BANDAGES/DRESSINGS) ×4
ARMBAND PINK RESTRICT EXTREMIT (MISCELLANEOUS) ×6 IMPLANT
BAG DECANTER FOR FLEXI CONT (MISCELLANEOUS) ×4 IMPLANT
BANDAGE ELASTIC 4 VELCRO ST LF (GAUZE/BANDAGES/DRESSINGS) ×1 IMPLANT
BANDAGE ELASTIC 6 VELCRO ST LF (GAUZE/BANDAGES/DRESSINGS) ×1 IMPLANT
BANDAGE GAUZE ELAST BULKY 4 IN (GAUZE/BANDAGES/DRESSINGS) ×1 IMPLANT
CANISTER SUCTION 2500CC (MISCELLANEOUS) ×5 IMPLANT
CATH CANNON HEMO 15F 50CM (CATHETERS) IMPLANT
CATH CANNON HEMO 15FR 19 (HEMODIALYSIS SUPPLIES) IMPLANT
CATH CANNON HEMO 15FR 23CM (HEMODIALYSIS SUPPLIES) ×1 IMPLANT
CATH CANNON HEMO 15FR 31CM (HEMODIALYSIS SUPPLIES) IMPLANT
CATH CANNON HEMO 15FR 32 (HEMODIALYSIS SUPPLIES) IMPLANT
CATH CANNON HEMO 15FR 32CM (HEMODIALYSIS SUPPLIES) IMPLANT
CATH STRAIGHT 5FR 65CM (CATHETERS) ×1 IMPLANT
CLIP TI MEDIUM 6 (CLIP) ×5 IMPLANT
CLIP TI WIDE RED SMALL 6 (CLIP) ×5 IMPLANT
COVER PROBE W GEL 5X96 (DRAPES) ×7 IMPLANT
COVER SURGICAL LIGHT HANDLE (MISCELLANEOUS) ×5 IMPLANT
COVER TRANSDUCER ULTRASND GEL (DRAPE) ×2 IMPLANT
DECANTER SPIKE VIAL GLASS SM (MISCELLANEOUS) ×4 IMPLANT
DERMABOND ADVANCED (GAUZE/BANDAGES/DRESSINGS) ×1
DERMABOND ADVANCED .7 DNX12 (GAUZE/BANDAGES/DRESSINGS) ×4 IMPLANT
DRAPE C-ARM 42X72 X-RAY (DRAPES) ×5 IMPLANT
DRAPE CHEST BREAST 15X10 FENES (DRAPES) ×5 IMPLANT
DRAPE ORTHO SPLIT 77X108 STRL (DRAPES) ×5
DRAPE SURG ORHT 6 SPLT 77X108 (DRAPES) IMPLANT
DRAPE U-SHAPE 47X51 STRL (DRAPES) ×1 IMPLANT
ELECT REM PT RETURN 9FT ADLT (ELECTROSURGICAL) ×5
ELECTRODE REM PT RTRN 9FT ADLT (ELECTROSURGICAL) ×4 IMPLANT
GAUZE SPONGE 2X2 8PLY STRL LF (GAUZE/BANDAGES/DRESSINGS) ×4 IMPLANT
GAUZE SPONGE 4X4 16PLY XRAY LF (GAUZE/BANDAGES/DRESSINGS) ×5 IMPLANT
GEL ULTRASOUND 20GR AQUASONIC (MISCELLANEOUS) ×5 IMPLANT
GLOVE BIO SURGEON STRL SZ7 (GLOVE) ×7 IMPLANT
GLOVE BIOGEL PI IND STRL 7.5 (GLOVE) ×4 IMPLANT
GLOVE BIOGEL PI INDICATOR 7.5 (GLOVE) ×8
GLOVE SS BIOGEL STRL SZ 7 (GLOVE) IMPLANT
GLOVE SUPERSENSE BIOGEL SZ 7 (GLOVE) ×2
GOWN STRL NON-REIN LRG LVL3 (GOWN DISPOSABLE) ×20 IMPLANT
INTRODUCER COOK 11FR (CATHETERS) IMPLANT
INTRODUCER SET COOK 14FR (MISCELLANEOUS) ×1 IMPLANT
KIT BASIN OR (CUSTOM PROCEDURE TRAY) ×5 IMPLANT
KIT ENCORE 26 ADVANTAGE (KITS) ×1 IMPLANT
KIT ROOM TURNOVER OR (KITS) ×5 IMPLANT
NDL 18GX1X1/2 (RX/OR ONLY) (NEEDLE) ×4 IMPLANT
NDL HYPO 25GX1X1/2 BEV (NEEDLE) ×4 IMPLANT
NDL PERC 18GX7CM (NEEDLE) ×4 IMPLANT
NEEDLE 18GX1X1/2 (RX/OR ONLY) (NEEDLE) ×5 IMPLANT
NEEDLE HYPO 25GX1X1/2 BEV (NEEDLE) ×5 IMPLANT
NEEDLE PERC 18GX7CM (NEEDLE) ×5 IMPLANT
NS IRRIG 1000ML POUR BTL (IV SOLUTION) ×5 IMPLANT
PACK CV ACCESS (CUSTOM PROCEDURE TRAY) ×5 IMPLANT
PACK SURGICAL SETUP 50X90 (CUSTOM PROCEDURE TRAY) ×5 IMPLANT
PAD ARMBOARD 7.5X6 YLW CONV (MISCELLANEOUS) ×10 IMPLANT
SET INTRODUCER 12FR PACEMAKER (SHEATH) IMPLANT
SET MICROPUNCTURE 5F STIFF (MISCELLANEOUS) ×2 IMPLANT
SOAP 2 % CHG 4 OZ (WOUND CARE) ×5 IMPLANT
SPONGE GAUZE 2X2 STER 10/PKG (GAUZE/BANDAGES/DRESSINGS) ×1
SPONGE GAUZE 4X4 12PLY (GAUZE/BANDAGES/DRESSINGS) ×5 IMPLANT
SPONGE SURGIFOAM ABS GEL 100 (HEMOSTASIS) IMPLANT
SUT ETHILON 3 0 PS 1 (SUTURE) ×5 IMPLANT
SUT MNCRL AB 4-0 PS2 18 (SUTURE) ×11 IMPLANT
SUT PROLENE 5 0 C 1 24 (SUTURE) ×1 IMPLANT
SUT PROLENE 6 0 BV (SUTURE) ×6 IMPLANT
SUT PROLENE 7 0 BV 1 (SUTURE) ×4 IMPLANT
SUT SILK 0 TIES 10X30 (SUTURE) ×5 IMPLANT
SUT SILK 2 0 FS (SUTURE) IMPLANT
SUT VIC AB 3-0 SH 27 (SUTURE) ×15
SUT VIC AB 3-0 SH 27X BRD (SUTURE) ×4 IMPLANT
SWAB COLLECTION DEVICE MRSA (MISCELLANEOUS) IMPLANT
SYR 20CC LL (SYRINGE) ×10 IMPLANT
SYR 30ML LL (SYRINGE) IMPLANT
SYR 3ML LL SCALE MARK (SYRINGE) ×5 IMPLANT
SYR 5ML LL (SYRINGE) ×5 IMPLANT
SYR CONTROL 10ML LL (SYRINGE) ×4 IMPLANT
SYRINGE 10CC LL (SYRINGE) ×5 IMPLANT
TAPE CLOTH SURG 4X10 WHT LF (GAUZE/BANDAGES/DRESSINGS) ×1 IMPLANT
TOWEL OR 17X24 6PK STRL BLUE (TOWEL DISPOSABLE) ×5 IMPLANT
TOWEL OR 17X26 10 PK STRL BLUE (TOWEL DISPOSABLE) ×5 IMPLANT
TUBE ANAEROBIC SPECIMEN COL (MISCELLANEOUS) IMPLANT
UNDERPAD 30X30 INCONTINENT (UNDERPADS AND DIAPERS) ×6 IMPLANT
WATER STERILE IRR 1000ML POUR (IV SOLUTION) ×5 IMPLANT
WIRE AMPLATZ SS-J .035X180CM (WIRE) IMPLANT
WIRE BENTSON .035X145CM (WIRE) ×4 IMPLANT

## 2013-05-04 NOTE — H&P (View-Only) (Signed)
[facility] [place]  Referred by:  Halford Chessman, MD [place] [id number] [place], [place] [address]  Reason for referral: New access  History of Present Illness  Jacob [name] is a 62 y.o. (07/09/1951) male who presents for evaluation left radiocephalic arteriovenous fistula.  The patient had a Fluency stent placed at a [facility] access center ~2 weeks ago for reported L subclavian vein occlusion.  The patient is right hand dominant.  Since then the patient has developed L arm swelling over the last week.  He denies any neuro sx currently but has difficulty using the L hand due to swelling.  His only access to date has been the L RC AVF placed by an outside practice over 5 years ago.  His only TDC placement was a LIJ TDC.  Past Medical History  Diagnosis Date  . Nonischemic cardiomyopathy     Hypertension and regular alcohol use, LVEF 40-45%  . End stage renal disease     Hemodialysis on Monday, Wednesday, Friday  . Chronic systolic heart failure   . Anemia in chronic kidney disease(285.21)   . Type 2 diabetes mellitus   . Anemia of chronic disease   . Essential hypertension, benign   . GERD (gastroesophageal reflux disease)   . CHF (congestive heart failure)     Past Surgical History  Procedure Laterality Date  . Lumbar spine surgery      History   Social History  . Marital Status: Divorced    Spouse Name: N/A    Number of Children: N/A  . Years of Education: N/A   Occupational History  . Not on file.   Social History Main Topics  . Smoking status: Never Smoker   . Smokeless tobacco: Never Used  . Alcohol Use: 1.8 oz/week    3 Shots of liquor per week     Comment: Regular use for years, reports at least a pint per week  . Drug Use: No  . Sexual Activity: Not on file   Other Topics Concern  . Not on file   Social History Narrative  . No narrative on file    Family History  Problem Relation Age of Onset  . Heart attack  Brother     73  . Heart disease Brother     before age 66  . Hypertension Brother   . Heart attack Brother     75  . Heart attack Brother     18  . Diabetes Mother   . Hypertension Mother   . Heart disease Father   . Hypertension Father   . Other Father     amputation  . Hypertension Sister   . Vision loss Maternal Uncle    Current Outpatient Prescriptions on File Prior to Visit  Medication Sig Dispense Refill  . aspirin 325 MG tablet Take 325 mg by mouth daily.        . B Complex-C-Folic Acid (DIALYVITE TABLET) TABS Take 1 tablet by mouth as directed.        . carvedilol (COREG) 6.25 MG tablet Take 1 tablet (6.25 mg total) by mouth 2 (two) times daily.  180 tablet  3  . clopidogrel (PLAVIX) 75 MG tablet Take 75 mg by mouth daily.      [name] HYDROcodone-acetaminophen (NORCO/VICODIN) 5-325 MG per tablet Take 1 tablet by mouth Three times daily as needed.      . insulin glargine (LANTUS) 100 UNIT/ML injection Inject into the  skin at bedtime.        . Lanthanum Carbonate (FOSRENOL PO) Take by mouth. 4 tablets with each meal and 1 with snacks      . omeprazole (PRILOSEC) 40 MG capsule Take 40 mg by mouth daily.        . ramipril (ALTACE) 2.5 MG capsule Take 1 capsule (2.5 mg total) by mouth daily.  90 capsule  3  . SENSIPAR 30 MG tablet Take 1 tablet by mouth Daily.      . sevelamer (RENVELA) 800 MG tablet Take 1,600 mg by mouth 3 (three) times daily with meals.        . traMADol (ULTRAM) 50 MG tablet Take 1 tablet by mouth Three times a day.       No current facility-administered medications on file prior to visit.    Allergies  Allergen Reactions  . Penicillins     REVIEW OF SYSTEMS:  (Positives checked otherwise negative)  CARDIOVASCULAR:  []  chest pain, []  chest pressure, []  palpitations, []  shortness of breath when laying flat, []  shortness of breath with exertion,  []  pain in feet when walking, []  pain in feet when laying flat, []  history of blood clot in veins (DVT), []   history of phlebitis, [x]  swelling in arm, []  varicose veins  PULMONARY:  []  productive cough, []  asthma, []  wheezing  NEUROLOGIC:  []  weakness in arms or legs, []  numbness in arms or legs, []  difficulty speaking or slurred speech, []  temporary loss of vision in one eye, []  dizziness  HEMATOLOGIC:  []  bleeding problems, []  problems with blood clotting too easily  MUSCULOSKEL:  []  joint pain, []  joint swelling  GASTROINTEST:  []  vomiting blood, []  blood in stool     GENITOURINARY:  []  burning with urination, []  blood in urine  PSYCHIATRIC:  []  history of major depression  INTEGUMENTARY:  []  rashes, []  ulcers  CONSTITUTIONAL:  []  fever, []  chills  Physical Examination  Filed Vitals:   04/28/13 1000  BP: 137/70  Pulse: 80  Temp: 97.8 F (36.6 C)  TempSrc: Oral  Height: 6\' 2"  (1.88 m)  Weight: 308 lb (139.708 kg)  SpO2: 98%   Body mass index is 39.53 kg/(m^2).  General: A&O x 3, WD, Morbidly Obese,   Head: [place]/AT  Ear/Nose/Throat: Hearing grossly intact, nares w/o erythema or drainage, oropharynx w/o Erythema/Exudate, Mallampati score: 3  Eyes: PERRLA, EOMI  Neck: Supple, no nuchal rigidity, no palpable LAD, large neck  Pulmonary: Sym exp, good air movt, CTAB, no rales, rhonchi, & wheezing, mild chest vein enlargement  Cardiac: RRR, Nl S1, S2, no Murmurs, rubs or gallops  Vascular: Vessel Right Left  Radial Palpable Faintly Palpable  Ulnar Faintly Palpable Not Palpable  Brachial Palpable Faintly Palpable  Carotid Palpable, without bruit Palpable, without bruit  Aorta Not palpable N/A  Femoral Palpable Palpable  Popliteal Not palpable Not palpable  PT Not Palpable Not Palpable  DP Palpable Palpable   Gastrointestinal: soft, NTND, -G/R, - HSM, - masses, - CVAT B  Musculoskeletal: M/S 5/5 throughout except L hand due to swelling, Extremities without ischemic changes , L hand 3+ swelling Neurologic: CN 2-12 intact , Pain and light touch intact in extremities ,  Motor exam as listed above  Psychiatric: Judgment intact, Mood & affect appropriate for pt's clinical situation  Dermatologic: See M/S exam for extremity exam, no rashes otherwise noted  Lymph : No Cervical, Axillary, or Inguinal lymphadenopathy   Non-Invasive Vascular Imaging  Vein Mapping  (Date: 04/28/2013):  R arm: acceptable vein conduits include entire R cephalic vein, upper arm basilic vein  Outside Studies/Documentation 5 pages of outside documents were reviewed including: outside access center report.  Medical Decision Making  Jacob [name] is a 62 y.o. male who presents with ESRD requiring hemodialysis, likely recurrent L SCV occlusion   I do not have access to the [facility] access center images, but the procedure note basically details stent placement in the setting of venous occlusion likely due to venous thoracic outlet disease.    Obvious this was not successful as evident by the swelling due to L arm venous outflow stenosis, likely due to occluded stent.  Usually venous TOS is treated with venoplasty and anticoagulation followed by TOS decompression.  No vascular surgeon in the [place] does TOS decompression, with the closest surgeon doing TOS at [facility].  In my opinion, I suspect he is not a candidate for vascular reconstruction in this situation with an occluded stent in the outflow tract.   I recommend: ligation of L RC AVF, compressive therapy and L arm elevation, central venogram via both IJV, placement of TDC , and placement of R arm fistula if possible.  Based on vein mapping and examination, this patient's permanent access options include: R RC vs BC AVF, R BVT.   I would start with the R RC vs BC AVF.  I had an extensive discussion with this patient in regards to the nature of access surgery, including risk, benefits, and alternatives.    The patient is aware that the risks of access surgery include but are not limited to: bleeding, infection, steal  syndrome, nerve damage, ischemic monomelic neuropathy, failure of access to mature, and possible need for additional access procedures in the future.  The patient has agreed to proceed with the above procedure which will be scheduled 2 OCT 14 in the hybrid OR.[name], MD [facility] [place] Office: [phone] Pager: [phone]  04/28/2013, 10:32 AM

## 2013-05-04 NOTE — OR Nursing (Signed)
Ligation end at 837

## 2013-05-04 NOTE — Anesthesia Preprocedure Evaluation (Addendum)
Anesthesia Evaluation  Patient identified by MRN, date of birth, ID band Patient awake    Reviewed: Allergy & Precautions, H&P , NPO status , Patient's Chart, lab work & pertinent test results, reviewed documented beta blocker date and time   Airway Mallampati: II TM Distance: >3 FB Neck ROM: Full    Dental no notable dental hx. (+) Edentulous Upper, Poor Dentition, Missing and Dental Advisory Given   Pulmonary shortness of breath,  breath sounds clear to auscultation  Pulmonary exam normal       Cardiovascular hypertension, Pt. on medications and Pt. on home beta blockers +CHF negative cardio ROS  Rhythm:Regular Rate:Normal     Neuro/Psych negative neurological ROS  negative psych ROS   GI/Hepatic Neg liver ROS, GERD-  Medicated and Controlled,  Endo/Other  diabetes, Type 1, Insulin DependentMorbid obesity  Renal/GU ESRF and DialysisRenal disease  negative genitourinary   Musculoskeletal negative musculoskeletal ROS (+)   Abdominal Normal abdominal exam  (+)   Peds negative pediatric ROS (+)  Hematology negative hematology ROS (+)   Anesthesia Other Findings   Reproductive/Obstetrics negative OB ROS                         Anesthesia Physical Anesthesia Plan  ASA: III  Anesthesia Plan: General   Post-op Pain Management:    Induction: Intravenous  Airway Management Planned: Oral ETT  Additional Equipment:   Intra-op Plan:   Post-operative Plan: Extubation in OR  Informed Consent: I have reviewed the patients History and Physical, chart, labs and discussed the procedure including the risks, benefits and alternatives for the proposed anesthesia with the patient or authorized representative who has indicated his/her understanding and acceptance.   Dental advisory given  Plan Discussed with: CRNA, Surgeon and Anesthesiologist  Anesthesia Plan Comments:        Anesthesia  Quick Evaluation

## 2013-05-04 NOTE — Telephone Encounter (Signed)
LVM re: appointment information - sent letter - kf

## 2013-05-04 NOTE — Anesthesia Postprocedure Evaluation (Signed)
  Anesthesia Post-op Note  Patient: Jacob [name]  Procedure(s) Performed: Procedure(s) with comments: LIGATION OF LEFT RADIAL CEPHALIC ARTERIOVENOUS  FISTULA (Left) - Ultrasound guided INSERTION OF DIALYSIS CATHETER (Right) - Ultrasound guided CENTRAL VENOGRAM (N/A) ARTERIOVENOUS (AV) FISTULA CREATION- RIGHT ARM (Right) - Ultrasound guided  Patient Location: PACU  Anesthesia Type:General  Level of Consciousness: awake  Airway and Oxygen Therapy: Patient Spontanous Breathing and Patient connected to face mask oxygen  Post-op Pain: none  Post-op Assessment: Post-op Vital signs reviewed, Patient's Cardiovascular Status Stable, Respiratory Function Stable, Patent Airway and No signs of Nausea or vomiting  Post-op Vital Signs: Reviewed and stable  Complications: No apparent anesthesia complications

## 2013-05-04 NOTE — Op Note (Signed)
OPERATIVE NOTE  PROCEDURE: 1. Ligation of left radiocephalic arteriovenous fistula 2.  Cannulation of left internal jugular vein under ultrasound guidance 3.  Left central venogram 4.  Right internal jugular vein tunneled dialysis catheter placement 5.  Right internal jugular vein cannulation under ultrasound guidance 6.  Right radiocephalic arteriovenous fistula placement  PRE-OPERATIVE DIAGNOSIS: end-stage renal failure  POST-OPERATIVE DIAGNOSIS: same as above  SURGEON: [name], MD  ANESTHESIA: general  ESTIMATED BLOOD LOSS: 30 cc  CONTRAST: 10 cc  FINDING(S): 1.  Dopplerable left radial signal at end of ligation: none prior to ligation 2.  Left subclavian vein stent jails the left internal jugular vein as it covers the confluence of the subclavian vein and internal jugular vein  3.  Tips of the catheter in the right atrium on fluoroscopy 4.  No obvious pneumothorax on fluoroscopy 5.  Palpable weak thrill in fistula at end of case 6.  Dopplerable radial signal at end of case  SPECIMEN(S):  none  INDICATIONS:   Jacob [name] is a 62 y.o. male who presents with severe left arm swelling in the setting of left subclavian vein stent occlusion at an outside access center.  Based on his exam and previous operative notes, I felt the left radiocephalic arteriovenous fistula  was not salvageable.  I recommended: ligation of left radiocephalic arteriovenous fistula, left central venogram, tunneled dialysis catheter placement, and right arm arteriovenous fistula placement.  The patient is aware the risks of tunneled dialysis catheter placement include but are not limited to: bleeding, infection, central venous injury, pneumothorax, possible venous stenosis, possible malpositioning in the venous system, and possible infections related to long-term catheter presence.  The patient was aware of these risks and agreed to proceed.  DESCRIPTION: After written full informed consent was obtained  from the patient, the patient was taken back to the operating room.  Prior to induction, the patient was given IV antibiotics.  After obtaining adequate sedation, the patient was prepped and draped in the standard fashion for a left arm access procedure.  I made an incision through the prior wrist incision.  I dissected out the radiocephalic arteriovenous fistula and tied it off with 0 ties.  I also double clipped the distal fistula and transected the fistula.  I oversewed the proximal end of the fistula with a 5-0 Prolene as extra precaution.  Distally the previously absent radial signal was now dopplerable.  The subcutaneous tissue was reapproximated with a double layer of 3-0 Vicryl.  The skin was then reapproximated with staples, due to likelihood of drainage from the severe left arm edema.  The left arm was cleaned and drained.  Sterile dressings were applied to the incision and the forearm wrapped with Kerlix.  An ACE bandage was wrapped from hand to shoulder to provide additional compression to the left arm.  At this point the drapes were removed and the patient was repositioned for a tunneled dialysis catheter placement.  The patient was prepped and draped in the standard fashion for a chest or neck tunneled dialysis catheter placement.  Under ultrasound guidance, the left common internal jugular vein was cannulated with a micropuncture needle.  The microwire was advanced toward the innominate vein but it met resistance at 10 cm.  The needle was exchanged for a microsheath, which was loaded into the left internal jugular vein.  A hand injection was completed via the microsheath to completed the left central venogram.  Based on the images, it is impossible to place a tunneled  dialysis catheter via a Left internal jugular vein approach due to the stent placed by outside access center.  I pulled out the microsheath and held pressure to the left internal jugular vein for 3 minutes.  No bleeding was  present.  Under ultrasound guidance, the right internal jugular vein was cannulated with the 18 gauge needle.  A J-wire was then placed down in the inferior vena cava under fluroscopic guidance.  The wire was then secured in place with a clamp to the drapes.  I then made stab incisions at the neck and exit sites.   I dissected from the exit site to the cannulation site with a metal tunneler.   The subcutaneous tunnel was dilated by passing a plastic dilator over the metal dissector. The wire was then unclamped and I removed the needle.  The skin tract and venotomy was dilated serially with dilators.  Finally, the dilator-sheath was placed under fluroscopic guidance into the superior vena cava.  The dilator and wire were removed.  A 23 cm Diatek catheter was placed under fluoroscopic guidance down into the right atrium.  The sheath was broken and peeled away while holding the catheter cuff at the level of the skin.  The back end of this catheter was transected, revealing the two lumens of this catheter.  The ports were docked onto these two lumens.  The catheter hub was then screwed into place.  Each port was tested by aspirating and flushing.  No resistance was noted.  Each port was then thoroughly flushed with heparinized saline.  The catheter was secured in placed with two interrupted stitches of 3-0 Nylon tied to the catheter.  The neck incision was closed with a U-stitch of 4-0 Monocryl.  The neck and chest incision were cleaned and sterile bandages applied.  Each port was then loaded with concentrated heparin (1000 Units/mL) at the manufacturer recommended volumes to each port.  Sterile caps were applied to each port.  On completion fluoroscopy, the tips of the catheter were in the right atrium, and there was no evidence of pneumothorax.  At this point, the drapes were removed the patient was repositioned for a right arm access procedure.  The patient was prepped and draped in the standard fashion for a  right arm access procedure.  I turned my attention first to identifying the patient's distal cephalic vein and radial artery.  Using SonoSite guidance, the location of these vessels were marked out on the skin.   The adequate cephalic vein segment was on the volar surface of the distal arm so two incision were necessary.  I made a longitudinal incision at the level of the wrist over the radial artery and dissected through the subcutaneous tissue and fascia to gain exposure of the radial artery.  This was noted to be 2.5-3.0 mm in diameter externally.  This was dissected out proximally and distally.  I then made a longitudinal incision over the cephalic vein on the volar surface.  I dissected out the cephalic vein and tied off side branches.  This vein was noted to be 3 mm in diameter externally.  The distal segment of the vein was ligated with a  2-0 silk, and the vein was transected.  The proximal segment was interrogated with serial dilators.  The vein accepted up to a 3 mm dilator without any difficulty.  I then instilled the heparinized saline into the vein and clamped it.  At this point, I reset my exposure of the radial artery and  clamped the artery proximally and distally.  I made an arteriotomy with a #11 blade, and then I extended the arteriotomy with a Potts scissor.  I injected heparinized saline proximal and distal to this arteriotomy.  The vein was then sewn to the artery in an end-to-side configuration with a running stitch of 7-0 Prolene.  Prior to completing this anastomosis, I allowed the vein and artery to backbleed.  There was no evidence of clot from any vessels.  I completed the anastomosis in the usual fashion and then released all vessel loops and clamps.  There was a weakly palpable  thrill in the venous outflow, and there was a dopplerable radial signal.  At this point, I irrigated out the surgical wound.  There was no further active bleeding.  The subcutaneous tissue in both incision was  reapproximated with a running stitch of 3-0 Vicryl.  The skin at both incisions were then reapproximated with a running subcuticular stitch of 4-0 Vicryl.  The skin was then cleaned, dried, and reinforced with Dermabond.  The patient tolerated this procedure well.   COMPLICATIONS: none  CONDITION: stable  [name], MD [facility] [place] Office: [phone] Pager: [phone]  05/04/2013, 9:22 AM

## 2013-05-04 NOTE — Telephone Encounter (Signed)
Message copied by Berniece Salines on Thu May 04, 2013  1:32 PM ------      Message from: Alfonso Patten      Created: Thu May 04, 2013 11:35 AM                   ----- Message -----         From: Conrad [place], MD         Sent: 05/04/2013  11:17 AM           To: Patrici Ranks, Alfonso Patten, RN            Jacob [name]      LA:[id number]      1950-11-24            PROCEDURE:      1. Ligation of left radiocephalic arteriovenous fistula      2.  Cannulation of left internal jugular vein under ultrasound guidance      3.  Left central venogram      4.  Right internal jugular vein tunneled dialysis catheter placement      5.  Right internal jugular vein cannulation under ultrasound guidance      6.  Right radiocephalic arteriovenous fistula placement            Asst: Gerri Lins, PAC            Follow-up: 4 wk             ------

## 2013-05-04 NOTE — Transfer of Care (Signed)
Immediate Anesthesia Transfer of Care Note  Patient: Jacob [name]  Procedure(s) Performed: Procedure(s) with comments: LIGATION OF LEFT RADIAL CEPHALIC ARTERIOVENOUS  FISTULA (Left) - Ultrasound guided INSERTION OF DIALYSIS CATHETER (Right) - Ultrasound guided CENTRAL VENOGRAM (N/A) ARTERIOVENOUS (AV) FISTULA CREATION- RIGHT ARM (Right) - Ultrasound guided  Patient Location: PACU  Anesthesia Type:General  Level of Consciousness: awake, alert  and oriented  Airway & Oxygen Therapy: Patient Spontanous Breathing and Patient connected to face mask oxygen  Post-op Assessment: Report given to PACU RN  Post vital signs: Reviewed and stable  Complications: No apparent anesthesia complications

## 2013-05-04 NOTE — Interval H&P Note (Signed)
[facility] [place]  History and Physical Update  The patient was interviewed and re-examined.  The patient's previous History and Physical has been reviewed and is unchanged.  There is no change in the plan of care: Ligation L RC AVF, central venogram via LIJ possible RIJ, TDC placement, R arm AVF placement.  [name], MD [facility] [place] Office: [phone] Pager: [phone]  05/04/2013, 7:06 AM

## 2013-05-04 NOTE — Preoperative (Signed)
Beta Blockers   Reason not to administer Beta Blockers:Not Applicable 

## 2013-05-05 ENCOUNTER — Encounter (HOSPITAL_COMMUNITY): Payer: Self-pay | Admitting: Vascular Surgery

## 2013-05-05 DIAGNOSIS — N186 End stage renal disease: Secondary | ICD-10-CM | POA: Diagnosis not present

## 2013-05-05 DIAGNOSIS — N2581 Secondary hyperparathyroidism of renal origin: Secondary | ICD-10-CM | POA: Diagnosis not present

## 2013-05-05 DIAGNOSIS — D509 Iron deficiency anemia, unspecified: Secondary | ICD-10-CM | POA: Diagnosis not present

## 2013-05-06 DIAGNOSIS — E8779 Other fluid overload: Secondary | ICD-10-CM | POA: Diagnosis not present

## 2013-05-06 DIAGNOSIS — N186 End stage renal disease: Secondary | ICD-10-CM | POA: Diagnosis not present

## 2013-05-08 DIAGNOSIS — N2581 Secondary hyperparathyroidism of renal origin: Secondary | ICD-10-CM | POA: Diagnosis not present

## 2013-05-08 DIAGNOSIS — N186 End stage renal disease: Secondary | ICD-10-CM | POA: Diagnosis not present

## 2013-05-08 DIAGNOSIS — D509 Iron deficiency anemia, unspecified: Secondary | ICD-10-CM | POA: Diagnosis not present

## 2013-05-10 DIAGNOSIS — Z992 Dependence on renal dialysis: Secondary | ICD-10-CM | POA: Diagnosis not present

## 2013-05-10 DIAGNOSIS — N186 End stage renal disease: Secondary | ICD-10-CM | POA: Diagnosis not present

## 2013-05-10 DIAGNOSIS — D509 Iron deficiency anemia, unspecified: Secondary | ICD-10-CM | POA: Diagnosis not present

## 2013-05-10 DIAGNOSIS — N2581 Secondary hyperparathyroidism of renal origin: Secondary | ICD-10-CM | POA: Diagnosis not present

## 2013-05-12 DIAGNOSIS — N186 End stage renal disease: Secondary | ICD-10-CM | POA: Diagnosis not present

## 2013-05-12 DIAGNOSIS — D509 Iron deficiency anemia, unspecified: Secondary | ICD-10-CM | POA: Diagnosis not present

## 2013-05-12 DIAGNOSIS — N2581 Secondary hyperparathyroidism of renal origin: Secondary | ICD-10-CM | POA: Diagnosis not present

## 2013-05-15 DIAGNOSIS — D509 Iron deficiency anemia, unspecified: Secondary | ICD-10-CM | POA: Diagnosis not present

## 2013-05-15 DIAGNOSIS — N186 End stage renal disease: Secondary | ICD-10-CM | POA: Diagnosis not present

## 2013-05-15 DIAGNOSIS — N2581 Secondary hyperparathyroidism of renal origin: Secondary | ICD-10-CM | POA: Diagnosis not present

## 2013-05-16 ENCOUNTER — Telehealth: Payer: Self-pay | Admitting: Vascular Surgery

## 2013-05-16 NOTE — Telephone Encounter (Addendum)
Message copied by Gena Fray on Tue May 16, 2013 11:45 AM ------      Message from: Denman George      Created: Tue May 16, 2013  9:15 AM      Regarding: needs nurse visit      Contact: [phone]       This pt. had ligation (L) R-C AVF and creation (R) R-C AVF on 10/2 per Dr. Bridgett Larsson;  He called stating he has staples in the left arm.  I spoke with Dr. Bridgett Larsson.  He will need to come in for a nurse visit to have the staples removed this week.  It would be ideal to book it on Fri., 10/17 when [facility] is here, although he dialyzes M-W-F in [place].  If pt. can do both on Fri, that would be my preference, but otherwise, he could come on Thurs, 10/16.  Thx.    ------  05/16/13: spoke with Jacob [name] to schedule appt for 05/19/13 @ 330pm, dpm

## 2013-05-17 DIAGNOSIS — N186 End stage renal disease: Secondary | ICD-10-CM | POA: Diagnosis not present

## 2013-05-17 DIAGNOSIS — D509 Iron deficiency anemia, unspecified: Secondary | ICD-10-CM | POA: Diagnosis not present

## 2013-05-17 DIAGNOSIS — N2581 Secondary hyperparathyroidism of renal origin: Secondary | ICD-10-CM | POA: Diagnosis not present

## 2013-05-19 ENCOUNTER — Ambulatory Visit: Payer: Medicare Other

## 2013-05-19 DIAGNOSIS — N186 End stage renal disease: Secondary | ICD-10-CM | POA: Diagnosis not present

## 2013-05-19 DIAGNOSIS — N2581 Secondary hyperparathyroidism of renal origin: Secondary | ICD-10-CM | POA: Diagnosis not present

## 2013-05-19 DIAGNOSIS — D509 Iron deficiency anemia, unspecified: Secondary | ICD-10-CM | POA: Diagnosis not present

## 2013-05-22 DIAGNOSIS — N2581 Secondary hyperparathyroidism of renal origin: Secondary | ICD-10-CM | POA: Diagnosis not present

## 2013-05-22 DIAGNOSIS — D509 Iron deficiency anemia, unspecified: Secondary | ICD-10-CM | POA: Diagnosis not present

## 2013-05-22 DIAGNOSIS — N186 End stage renal disease: Secondary | ICD-10-CM | POA: Diagnosis not present

## 2013-05-22 NOTE — Progress Notes (Signed)
Jacob [name] was seen today for staple removal from left lower arm. The area was cleaned and staples were removed per Dr. Lianne Moris orders.  [name] CMA, AAMA

## 2013-05-24 DIAGNOSIS — N186 End stage renal disease: Secondary | ICD-10-CM | POA: Diagnosis not present

## 2013-05-24 DIAGNOSIS — D509 Iron deficiency anemia, unspecified: Secondary | ICD-10-CM | POA: Diagnosis not present

## 2013-05-24 DIAGNOSIS — N2581 Secondary hyperparathyroidism of renal origin: Secondary | ICD-10-CM | POA: Diagnosis not present

## 2013-05-26 DIAGNOSIS — D509 Iron deficiency anemia, unspecified: Secondary | ICD-10-CM | POA: Diagnosis not present

## 2013-05-26 DIAGNOSIS — N2581 Secondary hyperparathyroidism of renal origin: Secondary | ICD-10-CM | POA: Diagnosis not present

## 2013-05-26 DIAGNOSIS — N186 End stage renal disease: Secondary | ICD-10-CM | POA: Diagnosis not present

## 2013-05-29 DIAGNOSIS — D509 Iron deficiency anemia, unspecified: Secondary | ICD-10-CM | POA: Diagnosis not present

## 2013-05-29 DIAGNOSIS — N186 End stage renal disease: Secondary | ICD-10-CM | POA: Diagnosis not present

## 2013-05-29 DIAGNOSIS — N2581 Secondary hyperparathyroidism of renal origin: Secondary | ICD-10-CM | POA: Diagnosis not present

## 2013-05-31 ENCOUNTER — Other Ambulatory Visit: Payer: Self-pay | Admitting: Cardiology

## 2013-05-31 DIAGNOSIS — N186 End stage renal disease: Secondary | ICD-10-CM | POA: Diagnosis not present

## 2013-05-31 DIAGNOSIS — N2581 Secondary hyperparathyroidism of renal origin: Secondary | ICD-10-CM | POA: Diagnosis not present

## 2013-05-31 DIAGNOSIS — D509 Iron deficiency anemia, unspecified: Secondary | ICD-10-CM | POA: Diagnosis not present

## 2013-06-02 DIAGNOSIS — N186 End stage renal disease: Secondary | ICD-10-CM | POA: Diagnosis not present

## 2013-06-02 DIAGNOSIS — N2581 Secondary hyperparathyroidism of renal origin: Secondary | ICD-10-CM | POA: Diagnosis not present

## 2013-06-02 DIAGNOSIS — D509 Iron deficiency anemia, unspecified: Secondary | ICD-10-CM | POA: Diagnosis not present

## 2013-06-05 DIAGNOSIS — D509 Iron deficiency anemia, unspecified: Secondary | ICD-10-CM | POA: Diagnosis not present

## 2013-06-05 DIAGNOSIS — N2581 Secondary hyperparathyroidism of renal origin: Secondary | ICD-10-CM | POA: Diagnosis not present

## 2013-06-05 DIAGNOSIS — N186 End stage renal disease: Secondary | ICD-10-CM | POA: Diagnosis not present

## 2013-06-06 ENCOUNTER — Encounter: Payer: Self-pay | Admitting: Cardiology

## 2013-06-06 ENCOUNTER — Ambulatory Visit (INDEPENDENT_AMBULATORY_CARE_PROVIDER_SITE_OTHER): Payer: Medicare Other | Admitting: Cardiology

## 2013-06-06 VITALS — Ht 74.0 in | Wt 304.0 lb

## 2013-06-06 DIAGNOSIS — I428 Other cardiomyopathies: Secondary | ICD-10-CM

## 2013-06-06 DIAGNOSIS — N186 End stage renal disease: Secondary | ICD-10-CM | POA: Diagnosis not present

## 2013-06-06 DIAGNOSIS — Z992 Dependence on renal dialysis: Secondary | ICD-10-CM

## 2013-06-06 NOTE — Assessment & Plan Note (Signed)
Keep followup with Dr. Lowanda Foster.

## 2013-06-06 NOTE — Assessment & Plan Note (Signed)
LVEF 55-60% by followup testing earlier in the year. Continue medical therapy.

## 2013-06-06 NOTE — Progress Notes (Signed)
Clinical Summary Jacob [name] is a 62 y.o.male last seen in May of this year. He reports no new cardiac symptoms, no hospitalizations. States that he was evaluated by the renal transplant team in [place], although there are no active plans to pursue a transplant at this time. He continues on regular hemodialysis, had to have a new shunt placed on the right side, left side is clotted.  Echocardiogram on 4/24 revealed moderate LVH with LVEF 0000000, grade 1 diastolic dysfunction, trivial mitral regurgitation, mild left atrial enlargement. Lexiscan Cardiolite on 4/30 showed no diagnostic ST segment changes, overall low risk perfusion study with small, partially reversible lateral apical defect possibly reflecting variable soft tissue attenuation, less likely a small region of ischemia. LVEF was 53% by that study.   Allergies  Allergen Reactions  . Penicillins Swelling    Swelling of throat and tongue    Current Outpatient Prescriptions  Medication Sig Dispense Refill  . ALTACE 2.5 MG capsule TAKE 1 BY MOUTH DAILY.     *START AFTER CURRENT SUPPLYOF AMLODIPINE 5 IS FINISHEDTHEN DISCONTINUE AMLODIPINE  90 capsule  3  . aspirin 325 MG tablet Take 325 mg by mouth daily.        . B Complex-C-Folic Acid (DIALYVITE TABLET) TABS Take 1 tablet by mouth every other day.       . B-D ULTRAFINE III SHORT PEN 31G X 8 MM MISC       . carvedilol (COREG) 6.25 MG tablet Take 1 tablet (6.25 mg total) by mouth 2 (two) times daily.  180 tablet  3  . clopidogrel (PLAVIX) 75 MG tablet Take 75 mg by mouth daily.      [name] EPIPEN 2-PAK 0.3 MG/0.3ML SOAJ injection       . insulin glargine (LANTUS) 100 UNIT/ML injection Inject 30 Units into the skin at bedtime.       [name] lanthanum (FOSRENOL) 500 MG chewable tablet Chew 500-2,000 mg by mouth 3 (three) times daily with meals. Take 4 tablets with meals and 1 tablet with snacks      . omeprazole (PRILOSEC) 40 MG capsule Take 40 mg by mouth daily.        [name] oxyCODONE-acetaminophen  (PERCOCET/ROXICET) 5-325 MG per tablet Take 1 tablet by mouth every 6 (six) hours as needed for pain.  30 tablet  0  . SENSIPAR 30 MG tablet Take 30 mg by mouth daily with breakfast.       . traMADol (ULTRAM) 50 MG tablet Take 50 mg by mouth every 8 (eight) hours as needed for pain.        No current facility-administered medications for this visit.    Past Medical History  Diagnosis Date  . Nonischemic cardiomyopathy     Hypertension and regular alcohol use, LVEF 40-45%  . End stage renal disease     Hemodialysis on Monday, Wednesday, Friday  . Chronic systolic heart failure   . Anemia in chronic kidney disease(285.21)   . Type 2 diabetes mellitus   . Anemia of chronic disease   . Essential hypertension, benign   . GERD (gastroesophageal reflux disease)   . CHF (congestive heart failure)   . Shortness of breath     Hx: of with exertion  . Glaucoma     Hx: of    Social History Jacob [name] reports that he has never smoked. He has never used smokeless tobacco. Jacob [name] reports that he drinks about 1.8 ounces of alcohol per week.  Review of Systems  No palpitations or syncope. No active angina. No change in breathing status. Otherwise negative  Physical Examination There were no vitals filed for this visit. Filed Weights   06/06/13 1139  Weight: 304 lb (137.893 kg)    Obese male in no acute distress.  HEENT: Conjunctiva and lids normal, oropharynx with moist mucosa, poor dentition.  Neck: Supple, no elevated JVP, possible left carotid bruit (prior documentation of mild nonobstructive bilateral ICA disease).  Lungs: Decreased but clear overall, nonlabored.  Cardiac: Indistinct PMI, regular rate and rhythm, no S3, soft systolic murmur.  Abdomen: Nontender, bowel sounds present, no hepatomegaly.  Musculoskeletal: No kyphosis.  Extremities: No distal pitting edema. AV fistula noted in the left forearm without thrill, right arm fistula active.     Problem List and Plan    Nonischemic cardiomyopathy LVEF 55-60% by followup testing earlier in the year. Continue medical therapy.  End-stage renal disease on hemodialysis Keep followup with Dr. Lowanda Foster.    Satira Sark, M.D., F.A.C.C.

## 2013-06-06 NOTE — Patient Instructions (Signed)
Continue all current medications. Your physician wants you to follow up in: 6 months.  You will receive a reminder letter in the mail one-two months in advance.  If you don't receive a letter, please call our office to schedule the follow up appointment   

## 2013-06-07 DIAGNOSIS — Z992 Dependence on renal dialysis: Secondary | ICD-10-CM | POA: Diagnosis not present

## 2013-06-07 DIAGNOSIS — N186 End stage renal disease: Secondary | ICD-10-CM | POA: Diagnosis not present

## 2013-06-08 ENCOUNTER — Encounter: Payer: Self-pay | Admitting: Vascular Surgery

## 2013-06-09 ENCOUNTER — Encounter: Payer: Self-pay | Admitting: Vascular Surgery

## 2013-06-09 ENCOUNTER — Ambulatory Visit (INDEPENDENT_AMBULATORY_CARE_PROVIDER_SITE_OTHER): Payer: Self-pay | Admitting: Vascular Surgery

## 2013-06-09 ENCOUNTER — Encounter: Payer: Medicare Other | Admitting: Vascular Surgery

## 2013-06-09 VITALS — BP 166/112 | HR 83 | Ht 74.0 in | Wt 302.1 lb

## 2013-06-09 DIAGNOSIS — Z4931 Encounter for adequacy testing for hemodialysis: Secondary | ICD-10-CM

## 2013-06-09 DIAGNOSIS — N186 End stage renal disease: Secondary | ICD-10-CM

## 2013-06-09 NOTE — Progress Notes (Signed)
[facility] [place]  Postoperative Access Visit  History of Present Illness  Jacob [name] is a 62 y.o. year old male who presents for postoperative follow-up for: ligation of L RC AVF, RIJ TDC, R RC AVF (Date: 05/04/13).  The patient's wounds are healed.  The patient notes no steal symptoms.  The patient is able to complete their activities of daily living.  The patient's current symptoms are: none.  The venogram done during the procedure also demonstrates jailing of the LIJV by the stent placed by the access center.  For VQI Use Only  PRE-ADM LIVING: Home  AMB STATUS: Ambulatory  Physical Examination Filed Vitals:   06/09/13 1509  BP: 166/112  Pulse: 83    LUE: incision is healed  RUE:  Incision is healed, skin feels warm, hand grip is 5/5, sensation in digits is intact, visibile distal 1/2 of fistula, palpable thrill, bruit can be auscultated, on Sonosite: fistula 5-5.5 mm in diameter  Medical Decision Making  Jacob [name] is a 62 y.o. year old male who presents s/p L RC AVF ligation for central venous occlusion, R RC AVF placement, ESRD req HD.   In expert hands, the R RC AVF is probably usable for access already.  Otherwise, I would wait another month for further maturation.  I suspect it will be ready in one month for general cannulation.  I will obtain an access duplex to re-evaluate the fistula in one month.  Thank you for allowing [place] to participate in this patient's care.  [name], MD [facility] [place] Office: [phone] Pager: [phone]  06/09/2013, 4:03 PM

## 2013-06-22 DIAGNOSIS — Z6838 Body mass index (BMI) 38.0-38.9, adult: Secondary | ICD-10-CM | POA: Diagnosis not present

## 2013-06-22 DIAGNOSIS — Z0000 Encounter for general adult medical examination without abnormal findings: Secondary | ICD-10-CM | POA: Diagnosis not present

## 2013-06-22 DIAGNOSIS — E785 Hyperlipidemia, unspecified: Secondary | ICD-10-CM | POA: Diagnosis not present

## 2013-06-27 ENCOUNTER — Ambulatory Visit: Payer: Medicare Other | Admitting: Cardiology

## 2013-07-02 DIAGNOSIS — N186 End stage renal disease: Secondary | ICD-10-CM | POA: Diagnosis not present

## 2013-07-03 DIAGNOSIS — D509 Iron deficiency anemia, unspecified: Secondary | ICD-10-CM | POA: Diagnosis not present

## 2013-07-03 DIAGNOSIS — N186 End stage renal disease: Secondary | ICD-10-CM | POA: Diagnosis not present

## 2013-07-03 DIAGNOSIS — N2581 Secondary hyperparathyroidism of renal origin: Secondary | ICD-10-CM | POA: Diagnosis not present

## 2013-07-05 DIAGNOSIS — N186 End stage renal disease: Secondary | ICD-10-CM | POA: Diagnosis not present

## 2013-07-20 ENCOUNTER — Encounter: Payer: Self-pay | Admitting: Vascular Surgery

## 2013-07-21 ENCOUNTER — Ambulatory Visit (INDEPENDENT_AMBULATORY_CARE_PROVIDER_SITE_OTHER): Payer: Self-pay | Admitting: Vascular Surgery

## 2013-07-21 ENCOUNTER — Ambulatory Visit (HOSPITAL_COMMUNITY)
Admission: RE | Admit: 2013-07-21 | Discharge: 2013-07-21 | Disposition: A | Discharge status: Home / Self Care | Payer: Medicare Other | Source: Ambulatory Visit | Attending: Vascular Surgery | Admitting: Vascular Surgery

## 2013-07-21 ENCOUNTER — Encounter: Payer: Self-pay | Admitting: Vascular Surgery

## 2013-07-21 VITALS — BP 158/116 | Ht 74.0 in | Wt 302.2 lb

## 2013-07-21 DIAGNOSIS — N186 End stage renal disease: Secondary | ICD-10-CM | POA: Diagnosis not present

## 2013-07-21 DIAGNOSIS — Z4931 Encounter for adequacy testing for hemodialysis: Secondary | ICD-10-CM

## 2013-07-21 NOTE — Progress Notes (Signed)
[facility] [place]  Postoperative Access Visit  History of Present Illness  Jacob [name] is a 62 y.o. year old male who presents for post-op evaluation s/p ligation of L RC AVF, RIJ TDC, R RC AVF (Date: 05/04/13). The patient's wounds are healed. The patient notes no steal symptoms. The patient is able to complete their activities of daily living. The patient's current symptoms are: R arm swelling.  By report, his R arm became swollen after recent cannulation.  For VQI Use Only  PRE-ADM LIVING: Home  AMB STATUS: Ambulatory  Physical Examination Filed Vitals:   07/21/13 1550  BP: 158/116   RUE: Incision is healed, skin feels warm, hand grip is 5/5, sensation in digits is intact, palpable thrill, bruit can be auscultated , R arm swelling, on Sonosite: > 5.5 mm throughout except near anastomosis ~3.0 mm  Medical Decision Making  Jacob [name] is a 62 y.o. year old male who presents s/p R RC AVF, Ligation L BC AVF, RIJ TDC.   I marked out the route of the fistula on his forearm.  If the patient can be successful run, I would pull the RIJ TDC as I suspected the [facility] is obturating the outflow.    If cannulation continues to be problematic, I would obtain a fistulogram and move the [facility] to the femoral vein to relieve outflow obstruction.  [name], MD [facility] [place] Office: [phone] Pager: [phone]  07/21/2013, 5:24 PM

## 2013-07-31 ENCOUNTER — Other Ambulatory Visit (HOSPITAL_COMMUNITY): Payer: Self-pay | Admitting: Nephrology

## 2013-07-31 DIAGNOSIS — N186 End stage renal disease: Secondary | ICD-10-CM

## 2013-08-01 ENCOUNTER — Ambulatory Visit (HOSPITAL_COMMUNITY)
Admission: RE | Admit: 2013-08-01 | Discharge: 2013-08-01 | Disposition: A | Discharge status: Home / Self Care | Payer: Medicare Other | Source: Ambulatory Visit | Attending: Nephrology | Admitting: Nephrology

## 2013-08-01 DIAGNOSIS — Z452 Encounter for adjustment and management of vascular access device: Secondary | ICD-10-CM | POA: Diagnosis not present

## 2013-08-01 DIAGNOSIS — N186 End stage renal disease: Secondary | ICD-10-CM

## 2013-08-01 MED ORDER — CHLORHEXIDINE GLUCONATE 4 % EX LIQD
CUTANEOUS | Status: AC
  Filled 2013-08-01: qty 30

## 2013-08-01 NOTE — Procedures (Signed)
Removal of right chest dialysis catheter without difficulty.  No immediate complication.

## 2013-08-02 DIAGNOSIS — N186 End stage renal disease: Secondary | ICD-10-CM | POA: Diagnosis not present

## 2013-08-04 DIAGNOSIS — N2581 Secondary hyperparathyroidism of renal origin: Secondary | ICD-10-CM | POA: Diagnosis not present

## 2013-08-04 DIAGNOSIS — N186 End stage renal disease: Secondary | ICD-10-CM | POA: Diagnosis not present

## 2013-08-04 DIAGNOSIS — D509 Iron deficiency anemia, unspecified: Secondary | ICD-10-CM | POA: Diagnosis not present

## 2013-08-07 DIAGNOSIS — N186 End stage renal disease: Secondary | ICD-10-CM | POA: Diagnosis not present

## 2013-08-07 DIAGNOSIS — N2581 Secondary hyperparathyroidism of renal origin: Secondary | ICD-10-CM | POA: Diagnosis not present

## 2013-08-07 DIAGNOSIS — D509 Iron deficiency anemia, unspecified: Secondary | ICD-10-CM | POA: Diagnosis not present

## 2013-08-09 ENCOUNTER — Other Ambulatory Visit: Payer: Self-pay | Admitting: Cardiology

## 2013-08-09 DIAGNOSIS — N2581 Secondary hyperparathyroidism of renal origin: Secondary | ICD-10-CM | POA: Diagnosis not present

## 2013-08-09 DIAGNOSIS — N186 End stage renal disease: Secondary | ICD-10-CM | POA: Diagnosis not present

## 2013-08-09 DIAGNOSIS — Z992 Dependence on renal dialysis: Secondary | ICD-10-CM | POA: Diagnosis not present

## 2013-08-09 DIAGNOSIS — D509 Iron deficiency anemia, unspecified: Secondary | ICD-10-CM | POA: Diagnosis not present

## 2013-08-11 DIAGNOSIS — N186 End stage renal disease: Secondary | ICD-10-CM | POA: Diagnosis not present

## 2013-08-11 DIAGNOSIS — D509 Iron deficiency anemia, unspecified: Secondary | ICD-10-CM | POA: Diagnosis not present

## 2013-08-11 DIAGNOSIS — N2581 Secondary hyperparathyroidism of renal origin: Secondary | ICD-10-CM | POA: Diagnosis not present

## 2013-08-14 DIAGNOSIS — D509 Iron deficiency anemia, unspecified: Secondary | ICD-10-CM | POA: Diagnosis not present

## 2013-08-14 DIAGNOSIS — N2581 Secondary hyperparathyroidism of renal origin: Secondary | ICD-10-CM | POA: Diagnosis not present

## 2013-08-14 DIAGNOSIS — N186 End stage renal disease: Secondary | ICD-10-CM | POA: Diagnosis not present

## 2013-08-16 DIAGNOSIS — N186 End stage renal disease: Secondary | ICD-10-CM | POA: Diagnosis not present

## 2013-08-16 DIAGNOSIS — D509 Iron deficiency anemia, unspecified: Secondary | ICD-10-CM | POA: Diagnosis not present

## 2013-08-16 DIAGNOSIS — N2581 Secondary hyperparathyroidism of renal origin: Secondary | ICD-10-CM | POA: Diagnosis not present

## 2013-08-18 DIAGNOSIS — D509 Iron deficiency anemia, unspecified: Secondary | ICD-10-CM | POA: Diagnosis not present

## 2013-08-18 DIAGNOSIS — N186 End stage renal disease: Secondary | ICD-10-CM | POA: Diagnosis not present

## 2013-08-18 DIAGNOSIS — N2581 Secondary hyperparathyroidism of renal origin: Secondary | ICD-10-CM | POA: Diagnosis not present

## 2013-08-21 DIAGNOSIS — N2581 Secondary hyperparathyroidism of renal origin: Secondary | ICD-10-CM | POA: Diagnosis not present

## 2013-08-21 DIAGNOSIS — D509 Iron deficiency anemia, unspecified: Secondary | ICD-10-CM | POA: Diagnosis not present

## 2013-08-21 DIAGNOSIS — N186 End stage renal disease: Secondary | ICD-10-CM | POA: Diagnosis not present

## 2013-08-23 DIAGNOSIS — N2581 Secondary hyperparathyroidism of renal origin: Secondary | ICD-10-CM | POA: Diagnosis not present

## 2013-08-23 DIAGNOSIS — D509 Iron deficiency anemia, unspecified: Secondary | ICD-10-CM | POA: Diagnosis not present

## 2013-08-23 DIAGNOSIS — N186 End stage renal disease: Secondary | ICD-10-CM | POA: Diagnosis not present

## 2013-08-24 ENCOUNTER — Encounter: Payer: Self-pay | Admitting: Cardiology

## 2013-08-24 DIAGNOSIS — N19 Unspecified kidney failure: Secondary | ICD-10-CM | POA: Diagnosis not present

## 2013-08-24 DIAGNOSIS — I509 Heart failure, unspecified: Secondary | ICD-10-CM | POA: Diagnosis not present

## 2013-08-24 DIAGNOSIS — E119 Type 2 diabetes mellitus without complications: Secondary | ICD-10-CM | POA: Diagnosis not present

## 2013-08-24 DIAGNOSIS — R0989 Other specified symptoms and signs involving the circulatory and respiratory systems: Secondary | ICD-10-CM | POA: Diagnosis not present

## 2013-08-24 DIAGNOSIS — R0602 Shortness of breath: Secondary | ICD-10-CM | POA: Diagnosis not present

## 2013-08-24 DIAGNOSIS — R0609 Other forms of dyspnea: Secondary | ICD-10-CM | POA: Diagnosis not present

## 2013-08-24 DIAGNOSIS — E1129 Type 2 diabetes mellitus with other diabetic kidney complication: Secondary | ICD-10-CM | POA: Diagnosis not present

## 2013-08-24 DIAGNOSIS — I10 Essential (primary) hypertension: Secondary | ICD-10-CM | POA: Diagnosis not present

## 2013-08-24 DIAGNOSIS — N186 End stage renal disease: Secondary | ICD-10-CM | POA: Diagnosis not present

## 2013-08-25 DIAGNOSIS — R0609 Other forms of dyspnea: Secondary | ICD-10-CM | POA: Diagnosis not present

## 2013-08-25 DIAGNOSIS — R9431 Abnormal electrocardiogram [ECG] [EKG]: Secondary | ICD-10-CM | POA: Diagnosis not present

## 2013-08-25 DIAGNOSIS — I491 Atrial premature depolarization: Secondary | ICD-10-CM | POA: Diagnosis not present

## 2013-08-25 DIAGNOSIS — I4460 Unspecified fascicular block: Secondary | ICD-10-CM | POA: Diagnosis not present

## 2013-08-25 DIAGNOSIS — N186 End stage renal disease: Secondary | ICD-10-CM | POA: Diagnosis not present

## 2013-08-28 DIAGNOSIS — N2581 Secondary hyperparathyroidism of renal origin: Secondary | ICD-10-CM | POA: Diagnosis not present

## 2013-08-28 DIAGNOSIS — D509 Iron deficiency anemia, unspecified: Secondary | ICD-10-CM | POA: Diagnosis not present

## 2013-08-28 DIAGNOSIS — N186 End stage renal disease: Secondary | ICD-10-CM | POA: Diagnosis not present

## 2013-08-30 DIAGNOSIS — D509 Iron deficiency anemia, unspecified: Secondary | ICD-10-CM | POA: Diagnosis not present

## 2013-08-30 DIAGNOSIS — N2581 Secondary hyperparathyroidism of renal origin: Secondary | ICD-10-CM | POA: Diagnosis not present

## 2013-08-30 DIAGNOSIS — N186 End stage renal disease: Secondary | ICD-10-CM | POA: Diagnosis not present

## 2013-09-01 DIAGNOSIS — N2581 Secondary hyperparathyroidism of renal origin: Secondary | ICD-10-CM | POA: Diagnosis not present

## 2013-09-01 DIAGNOSIS — N186 End stage renal disease: Secondary | ICD-10-CM | POA: Diagnosis not present

## 2013-09-01 DIAGNOSIS — D509 Iron deficiency anemia, unspecified: Secondary | ICD-10-CM | POA: Diagnosis not present

## 2013-09-02 DIAGNOSIS — N186 End stage renal disease: Secondary | ICD-10-CM | POA: Diagnosis not present

## 2013-09-04 DIAGNOSIS — N186 End stage renal disease: Secondary | ICD-10-CM | POA: Diagnosis not present

## 2013-09-04 DIAGNOSIS — D509 Iron deficiency anemia, unspecified: Secondary | ICD-10-CM | POA: Diagnosis not present

## 2013-09-04 DIAGNOSIS — N2581 Secondary hyperparathyroidism of renal origin: Secondary | ICD-10-CM | POA: Diagnosis not present

## 2013-09-06 DIAGNOSIS — Z992 Dependence on renal dialysis: Secondary | ICD-10-CM | POA: Diagnosis not present

## 2013-09-06 DIAGNOSIS — N2581 Secondary hyperparathyroidism of renal origin: Secondary | ICD-10-CM | POA: Diagnosis not present

## 2013-09-06 DIAGNOSIS — D509 Iron deficiency anemia, unspecified: Secondary | ICD-10-CM | POA: Diagnosis not present

## 2013-09-06 DIAGNOSIS — N186 End stage renal disease: Secondary | ICD-10-CM | POA: Diagnosis not present

## 2013-09-08 DIAGNOSIS — D509 Iron deficiency anemia, unspecified: Secondary | ICD-10-CM | POA: Diagnosis not present

## 2013-09-08 DIAGNOSIS — N2581 Secondary hyperparathyroidism of renal origin: Secondary | ICD-10-CM | POA: Diagnosis not present

## 2013-09-08 DIAGNOSIS — N186 End stage renal disease: Secondary | ICD-10-CM | POA: Diagnosis not present

## 2013-09-11 DIAGNOSIS — N2581 Secondary hyperparathyroidism of renal origin: Secondary | ICD-10-CM | POA: Diagnosis not present

## 2013-09-11 DIAGNOSIS — N186 End stage renal disease: Secondary | ICD-10-CM | POA: Diagnosis not present

## 2013-09-11 DIAGNOSIS — D509 Iron deficiency anemia, unspecified: Secondary | ICD-10-CM | POA: Diagnosis not present

## 2013-09-13 DIAGNOSIS — N2581 Secondary hyperparathyroidism of renal origin: Secondary | ICD-10-CM | POA: Diagnosis not present

## 2013-09-13 DIAGNOSIS — N186 End stage renal disease: Secondary | ICD-10-CM | POA: Diagnosis not present

## 2013-09-13 DIAGNOSIS — D509 Iron deficiency anemia, unspecified: Secondary | ICD-10-CM | POA: Diagnosis not present

## 2013-09-15 DIAGNOSIS — N186 End stage renal disease: Secondary | ICD-10-CM | POA: Diagnosis not present

## 2013-09-15 DIAGNOSIS — D509 Iron deficiency anemia, unspecified: Secondary | ICD-10-CM | POA: Diagnosis not present

## 2013-09-15 DIAGNOSIS — N2581 Secondary hyperparathyroidism of renal origin: Secondary | ICD-10-CM | POA: Diagnosis not present

## 2013-09-18 DIAGNOSIS — N186 End stage renal disease: Secondary | ICD-10-CM | POA: Diagnosis not present

## 2013-09-18 DIAGNOSIS — N2581 Secondary hyperparathyroidism of renal origin: Secondary | ICD-10-CM | POA: Diagnosis not present

## 2013-09-18 DIAGNOSIS — D509 Iron deficiency anemia, unspecified: Secondary | ICD-10-CM | POA: Diagnosis not present

## 2013-09-20 DIAGNOSIS — D509 Iron deficiency anemia, unspecified: Secondary | ICD-10-CM | POA: Diagnosis not present

## 2013-09-20 DIAGNOSIS — N186 End stage renal disease: Secondary | ICD-10-CM | POA: Diagnosis not present

## 2013-09-20 DIAGNOSIS — N2581 Secondary hyperparathyroidism of renal origin: Secondary | ICD-10-CM | POA: Diagnosis not present

## 2013-09-22 DIAGNOSIS — N186 End stage renal disease: Secondary | ICD-10-CM | POA: Diagnosis not present

## 2013-09-22 DIAGNOSIS — D509 Iron deficiency anemia, unspecified: Secondary | ICD-10-CM | POA: Diagnosis not present

## 2013-09-22 DIAGNOSIS — N2581 Secondary hyperparathyroidism of renal origin: Secondary | ICD-10-CM | POA: Diagnosis not present

## 2013-09-25 DIAGNOSIS — N2581 Secondary hyperparathyroidism of renal origin: Secondary | ICD-10-CM | POA: Diagnosis not present

## 2013-09-25 DIAGNOSIS — N186 End stage renal disease: Secondary | ICD-10-CM | POA: Diagnosis not present

## 2013-09-25 DIAGNOSIS — D509 Iron deficiency anemia, unspecified: Secondary | ICD-10-CM | POA: Diagnosis not present

## 2013-09-27 DIAGNOSIS — D509 Iron deficiency anemia, unspecified: Secondary | ICD-10-CM | POA: Diagnosis not present

## 2013-09-27 DIAGNOSIS — N186 End stage renal disease: Secondary | ICD-10-CM | POA: Diagnosis not present

## 2013-09-27 DIAGNOSIS — N2581 Secondary hyperparathyroidism of renal origin: Secondary | ICD-10-CM | POA: Diagnosis not present

## 2013-09-29 DIAGNOSIS — D509 Iron deficiency anemia, unspecified: Secondary | ICD-10-CM | POA: Diagnosis not present

## 2013-09-29 DIAGNOSIS — N186 End stage renal disease: Secondary | ICD-10-CM | POA: Diagnosis not present

## 2013-09-29 DIAGNOSIS — N2581 Secondary hyperparathyroidism of renal origin: Secondary | ICD-10-CM | POA: Diagnosis not present

## 2013-09-30 DIAGNOSIS — N186 End stage renal disease: Secondary | ICD-10-CM | POA: Diagnosis not present

## 2013-10-02 DIAGNOSIS — N186 End stage renal disease: Secondary | ICD-10-CM | POA: Diagnosis not present

## 2013-10-02 DIAGNOSIS — N2581 Secondary hyperparathyroidism of renal origin: Secondary | ICD-10-CM | POA: Diagnosis not present

## 2013-10-02 DIAGNOSIS — D509 Iron deficiency anemia, unspecified: Secondary | ICD-10-CM | POA: Diagnosis not present

## 2013-10-02 DIAGNOSIS — Z992 Dependence on renal dialysis: Secondary | ICD-10-CM | POA: Diagnosis not present

## 2013-10-18 DIAGNOSIS — Z992 Dependence on renal dialysis: Secondary | ICD-10-CM | POA: Diagnosis not present

## 2013-10-18 DIAGNOSIS — N186 End stage renal disease: Secondary | ICD-10-CM | POA: Diagnosis not present

## 2013-10-20 DIAGNOSIS — R109 Unspecified abdominal pain: Secondary | ICD-10-CM | POA: Diagnosis not present

## 2013-10-20 DIAGNOSIS — R112 Nausea with vomiting, unspecified: Secondary | ICD-10-CM | POA: Diagnosis not present

## 2013-10-20 DIAGNOSIS — N186 End stage renal disease: Secondary | ICD-10-CM | POA: Diagnosis not present

## 2013-10-20 DIAGNOSIS — Z794 Long term (current) use of insulin: Secondary | ICD-10-CM | POA: Diagnosis not present

## 2013-10-20 DIAGNOSIS — J988 Other specified respiratory disorders: Secondary | ICD-10-CM | POA: Diagnosis not present

## 2013-10-20 DIAGNOSIS — E119 Type 2 diabetes mellitus without complications: Secondary | ICD-10-CM | POA: Diagnosis not present

## 2013-10-20 DIAGNOSIS — Z79899 Other long term (current) drug therapy: Secondary | ICD-10-CM | POA: Diagnosis not present

## 2013-10-20 DIAGNOSIS — Z7982 Long term (current) use of aspirin: Secondary | ICD-10-CM | POA: Diagnosis not present

## 2013-10-20 DIAGNOSIS — K5289 Other specified noninfective gastroenteritis and colitis: Secondary | ICD-10-CM | POA: Diagnosis not present

## 2013-10-20 DIAGNOSIS — R319 Hematuria, unspecified: Secondary | ICD-10-CM | POA: Diagnosis not present

## 2013-10-20 DIAGNOSIS — J398 Other specified diseases of upper respiratory tract: Secondary | ICD-10-CM | POA: Diagnosis not present

## 2013-10-20 DIAGNOSIS — I120 Hypertensive chronic kidney disease with stage 5 chronic kidney disease or end stage renal disease: Secondary | ICD-10-CM | POA: Diagnosis not present

## 2013-10-20 DIAGNOSIS — Z992 Dependence on renal dialysis: Secondary | ICD-10-CM | POA: Diagnosis not present

## 2013-10-31 ENCOUNTER — Encounter (HOSPITAL_COMMUNITY): Payer: Self-pay | Admitting: Pharmacy Technician

## 2013-10-31 DIAGNOSIS — N186 End stage renal disease: Secondary | ICD-10-CM | POA: Diagnosis not present

## 2013-11-01 DIAGNOSIS — D509 Iron deficiency anemia, unspecified: Secondary | ICD-10-CM | POA: Diagnosis not present

## 2013-11-01 DIAGNOSIS — N2581 Secondary hyperparathyroidism of renal origin: Secondary | ICD-10-CM | POA: Diagnosis not present

## 2013-11-01 DIAGNOSIS — N186 End stage renal disease: Secondary | ICD-10-CM | POA: Diagnosis not present

## 2013-11-02 ENCOUNTER — Other Ambulatory Visit: Payer: Self-pay

## 2013-11-03 DIAGNOSIS — D509 Iron deficiency anemia, unspecified: Secondary | ICD-10-CM | POA: Diagnosis not present

## 2013-11-03 DIAGNOSIS — N186 End stage renal disease: Secondary | ICD-10-CM | POA: Diagnosis not present

## 2013-11-03 DIAGNOSIS — N2581 Secondary hyperparathyroidism of renal origin: Secondary | ICD-10-CM | POA: Diagnosis not present

## 2013-11-06 DIAGNOSIS — N2581 Secondary hyperparathyroidism of renal origin: Secondary | ICD-10-CM | POA: Diagnosis not present

## 2013-11-06 DIAGNOSIS — D509 Iron deficiency anemia, unspecified: Secondary | ICD-10-CM | POA: Diagnosis not present

## 2013-11-06 DIAGNOSIS — N186 End stage renal disease: Secondary | ICD-10-CM | POA: Diagnosis not present

## 2013-11-07 ENCOUNTER — Encounter (HOSPITAL_COMMUNITY)
Admission: RE | Disposition: A | Discharge status: Home / Self Care | Payer: Self-pay | Source: Ambulatory Visit | Attending: Surgery

## 2013-11-07 ENCOUNTER — Ambulatory Visit (HOSPITAL_COMMUNITY)
Admission: RE | Admit: 2013-11-07 | Discharge: 2013-11-07 | Disposition: A | Discharge status: Home / Self Care | Payer: Medicare Other | Source: Ambulatory Visit | Attending: Surgery | Admitting: Surgery

## 2013-11-07 DIAGNOSIS — D631 Anemia in chronic kidney disease: Secondary | ICD-10-CM | POA: Insufficient documentation

## 2013-11-07 DIAGNOSIS — Z794 Long term (current) use of insulin: Secondary | ICD-10-CM | POA: Diagnosis not present

## 2013-11-07 DIAGNOSIS — I428 Other cardiomyopathies: Secondary | ICD-10-CM | POA: Insufficient documentation

## 2013-11-07 DIAGNOSIS — Z992 Dependence on renal dialysis: Secondary | ICD-10-CM | POA: Insufficient documentation

## 2013-11-07 DIAGNOSIS — N039 Chronic nephritic syndrome with unspecified morphologic changes: Secondary | ICD-10-CM | POA: Diagnosis not present

## 2013-11-07 DIAGNOSIS — T82898A Other specified complication of vascular prosthetic devices, implants and grafts, initial encounter: Secondary | ICD-10-CM | POA: Insufficient documentation

## 2013-11-07 DIAGNOSIS — I120 Hypertensive chronic kidney disease with stage 5 chronic kidney disease or end stage renal disease: Secondary | ICD-10-CM | POA: Insufficient documentation

## 2013-11-07 DIAGNOSIS — I5022 Chronic systolic (congestive) heart failure: Secondary | ICD-10-CM | POA: Insufficient documentation

## 2013-11-07 DIAGNOSIS — E119 Type 2 diabetes mellitus without complications: Secondary | ICD-10-CM | POA: Diagnosis not present

## 2013-11-07 DIAGNOSIS — N186 End stage renal disease: Secondary | ICD-10-CM | POA: Diagnosis not present

## 2013-11-07 DIAGNOSIS — Y849 Medical procedure, unspecified as the cause of abnormal reaction of the patient, or of later complication, without mention of misadventure at the time of the procedure: Secondary | ICD-10-CM | POA: Insufficient documentation

## 2013-11-07 DIAGNOSIS — I509 Heart failure, unspecified: Secondary | ICD-10-CM | POA: Insufficient documentation

## 2013-11-07 DIAGNOSIS — Z7982 Long term (current) use of aspirin: Secondary | ICD-10-CM | POA: Diagnosis not present

## 2013-11-07 HISTORY — PX: SHUNTOGRAM: SHX5491

## 2013-11-07 LAB — POCT I-STAT, CHEM 8
BUN: 34 mg/dL — AB (ref 6–23)
CALCIUM ION: 1.06 mmol/L — AB (ref 1.13–1.30)
CHLORIDE: 98 meq/L (ref 96–112)
Creatinine, Ser: 11.4 mg/dL — ABNORMAL HIGH (ref 0.50–1.35)
Glucose, Bld: 182 mg/dL — ABNORMAL HIGH (ref 70–99)
HCT: 44 % (ref 39.0–52.0)
Hemoglobin: 15 g/dL (ref 13.0–17.0)
Potassium: 3.9 mEq/L (ref 3.7–5.3)
Sodium: 138 mEq/L (ref 137–147)
TCO2: 27 mmol/L (ref 0–100)

## 2013-11-07 LAB — GLUCOSE, CAPILLARY
GLUCOSE-CAPILLARY: 131 mg/dL — AB (ref 70–99)
Glucose-Capillary: 183 mg/dL — ABNORMAL HIGH (ref 70–99)

## 2013-11-07 SURGERY — ASSESSMENT, SHUNT FUNCTION, WITH CONTRAST RADIOGRAPHIC STUDY
Anesthesia: LOCAL

## 2013-11-07 MED ORDER — ACETAMINOPHEN 325 MG PO TABS: 325.0000 mg | ORAL_TABLET | ORAL | Status: DC | PRN

## 2013-11-07 MED ORDER — LIDOCAINE HCL (PF) 1 % IJ SOLN
INTRAMUSCULAR | Status: AC
  Filled 2013-11-07: qty 30

## 2013-11-07 MED ORDER — ACETAMINOPHEN 325 MG RE SUPP: 325.0000 mg | RECTAL | Status: DC | PRN

## 2013-11-07 MED ORDER — LABETALOL HCL 5 MG/ML IV SOLN: 10.0000 mg | INTRAVENOUS | Status: DC | PRN

## 2013-11-07 MED ORDER — ALUM & MAG HYDROXIDE-SIMETH 200-200-20 MG/5ML PO SUSP: 15.0000 mL | ORAL | Status: DC | PRN

## 2013-11-07 MED ORDER — HYDRALAZINE HCL 20 MG/ML IJ SOLN: 10.0000 mg | INTRAMUSCULAR | Status: DC | PRN

## 2013-11-07 MED ORDER — ONDANSETRON HCL 4 MG/2ML IJ SOLN: 4.0000 mg | Freq: Four times a day (QID) | INTRAMUSCULAR | Status: DC | PRN

## 2013-11-07 MED ORDER — SODIUM CHLORIDE 0.9 % IJ SOLN: 3.0000 mL | INTRAMUSCULAR | Status: DC | PRN

## 2013-11-07 MED ORDER — HEPARIN (PORCINE) IN NACL 2-0.9 UNIT/ML-% IJ SOLN
INTRAMUSCULAR | Status: AC
  Filled 2013-11-07: qty 500

## 2013-11-07 NOTE — Op Note (Signed)
    Patient name: Jacob [name] MRN: [id number] DOB: 28-Jan-1951 Sex: male  11/07/2013 Pre-operative Diagnosis: End stage renal disease Post-operative diagnosis:  Same Surgeon:  Eldridge Abrahams Procedure Performed:  1.  ultrasound-guided access, right radiocephalic fistula  2.  fistulogram   Indications:  The patient is having trouble with flow rate at dialysis.  He is brought in for further evaluation  Procedure:  The patient was identified in the holding area and taken to room 8.  The patient was then placed supine on the table and prepped and draped in the usual sterile fashion.  A time out was called.  Ultrasound was used to evaluate the fistula.  The vein was patent and compressible.  A digital ultrasound image was acquired.  The fistula was then accessed under ultrasound guidance using a micropuncture needle.  An 018 wire was then asvanced without resistance and a micropuncture sheath was placed.  Contrast injections were then performed through the sheath.  Findings:  The central venous system is widely patent.  The cephalic vein in the upper arm is small but patent throughout it's course.  The radiocephalic fistula is patent.  The vein diameter is adequate beginning approximately 3 cm beyond the arterial venous anastomosis.  There is a 3 cm segment of vein beginning at the arterial venous anastomosis which is small in caliber.  There are no stenoses within this area.   Intervention:  None  Impression:  #1  no central venous stenosis.  The cephalic vein is patent throughout the upper arm.  #2  the first portion of the cephalic vein fistula shows a very small caliber vein.  No intervention on this area was performed today, as there was no hemodynamically significant stenosis, and there was concern over potential rupture with balloon venoplasty.  #3  patient will be brought back to the office for discussions of revision of the proximal portion of the fistula.    Theotis Burrow,  M.D. [facility] [place] Office: [phone] Pager:  [phone]

## 2013-11-07 NOTE — H&P (Addendum)
Patient name: Jacob [name] MRN: [id number] DOB: 26-May-1951 Sex: male    No chief complaint on file.   HISTORY OF PRESENT ILLNESS: The patient has a right radiocephalic fistula in place.  He is having difficulty with access.  He comes in for fistulogram  Past Medical History  Diagnosis Date  . Nonischemic cardiomyopathy     Hypertension and regular alcohol use, LVEF 40-45%  . End stage renal disease     Hemodialysis on Monday, Wednesday, Friday  . Chronic systolic heart failure   . Anemia in chronic kidney disease(285.21)   . Type 2 diabetes mellitus   . Anemia of chronic disease   . Essential hypertension, benign   . GERD (gastroesophageal reflux disease)   . CHF (congestive heart failure)   . Shortness of breath     Hx: of with exertion  . Glaucoma     Hx: of    Past Surgical History  Procedure Laterality Date  . Lumbar spine surgery    . Colonoscopy      Hx: of  . Av fistula placement      Hx: of  . Ligation of arteriovenous  fistula Left 05/04/2013    Procedure: LIGATION OF LEFT RADIAL CEPHALIC ARTERIOVENOUS  FISTULA;  Surgeon: Conrad [place]ville, MD;  Location: [place];  Service: Vascular;  Laterality: Left;  Ultrasound guided  . Insertion of dialysis catheter Right 05/04/2013    Procedure: INSERTION OF DIALYSIS CATHETER;  Surgeon: Conrad Carrollton, MD;  Location: [place];  Service: Vascular;  Laterality: Right;  Ultrasound guided  . Fistulogram N/A 05/04/2013    Procedure: CENTRAL VENOGRAM;  Surgeon: Conrad Wacousta, MD;  Location: [place];  Service: Vascular;  Laterality: N/A;  . Av fistula placement Right 05/04/2013    Procedure: ARTERIOVENOUS (AV) FISTULA CREATION- RIGHT ARM;  Surgeon: Conrad Humboldt, MD;  Location: [place];  Service: Vascular;  Laterality: Right;  Ultrasound guided    History   Social History  . Marital Status: Divorced    Spouse Name: N/A    Number of Children: N/A  . Years of Education: N/A   Occupational History  . Not on file.   Social History Main  Topics  . Smoking status: Never Smoker   . Smokeless tobacco: Never Used  . Alcohol Use: 1.8 oz/week    3 Shots of liquor per week     Comment: Regular use for years, reports at least a pint per week  . Drug Use: No  . Sexual Activity: Not on file   Other Topics Concern  . Not on file   Social History Narrative  . No narrative on file    Family History  Problem Relation Age of Onset  . Heart attack Brother     10  . Heart disease Brother     before age 60  . Hypertension Brother   . Heart attack Brother     23  . Heart attack Brother     54  . Diabetes Mother   . Hypertension Mother   . Heart disease Father   . Hypertension Father   . Other Father     amputation  . Hypertension Sister   . Vision loss Maternal Uncle     Allergies as of 10/27/2013 - Review Complete 07/21/2013  Allergen Reaction Noted  . Penicillins Swelling 11/20/2010    No current facility-administered medications on file prior to encounter.   Current Outpatient Prescriptions on File  Prior to Encounter  Medication Sig Dispense Refill  . aspirin 325 MG tablet Take 325 mg by mouth daily.        . B Complex-C-Folic Acid (DIALYVITE TABLET) TABS Take 1 tablet by mouth every other day.       . B-D ULTRAFINE III SHORT PEN 31G X 8 MM MISC       . EPIPEN 2-PAK 0.3 MG/0.3ML SOAJ injection Inject 0.3 mg into the muscle once as needed (for bee).       [name] HYDROcodone-acetaminophen (NORCO/VICODIN) 5-325 MG per tablet Take 1 tablet by mouth every 4 (four) hours as needed.       . insulin glargine (LANTUS) 100 UNIT/ML injection Inject 30 Units into the skin at bedtime.       [name] lanthanum (FOSRENOL) 500 MG chewable tablet Chew 500-2,000 mg by mouth 3 (three) times daily with meals. Take 4 tablets with meals and 1 tablet with snacks      . omeprazole (PRILOSEC) 40 MG capsule Take 40 mg by mouth daily.        [name] RENVELA 800 MG tablet Take 800 mg by mouth 3 (three) times daily with meals.       . SENSIPAR 30 MG tablet  Take 30 mg by mouth daily with breakfast.          REVIEW OF SYSTEMS: Cardiovascular: No chest pain, chest pressure, palpitations, orthopnea, or dyspnea on exertion. No claudication or rest pain,  No history of DVT or phlebitis. Pulmonary: No productive cough, asthma or wheezing. Neurologic: No weakness, paresthesias, aphasia, or amaurosis. No dizziness. Hematologic: No bleeding problems or clotting disorders. Musculoskeletal: No joint pain or joint swelling. Gastrointestinal: No blood in stool or hematemesis Genitourinary: No dysuria or hematuria. Psychiatric:: No history of major depression. Integumentary: No rashes or ulcers. Constitutional: No fever or chills.  PHYSICAL EXAMINATION:   Vital signs are BP 173/81  Pulse 72  Temp(Src) 97.9 F (36.6 C) (Oral)  Resp 18  Ht 6\' 2"  (1.88 m)  Wt 290 lb (131.543 kg)  BMI 37.22 kg/m2  SpO2 98% General: The patient appears their stated age. HEENT:  No gross abnormalities Pulmonary:  Non labored breathing Musculoskeletal: There are no major deformities. Neurologic: No focal weakness or paresthesias are detected, Skin: There are no ulcer or rashes noted. Psychiatric: The patient has normal affect. Cardiovascular: Palpable thrill within right radiocephalic fistula   Assessment: End stage renal disease Plan: We'll proceed with a fistulogram and intervention as needed.  Eldridge Abrahams, M.D. [facility] [place] Office: [phone] Pager:  [phone]

## 2013-11-07 NOTE — Discharge Instructions (Signed)
AV Fistula °Care After °Refer to this sheet in the next few weeks. These instructions provide you with information on caring for yourself after your procedure. Your caregiver may also give you more specific instructions. Your treatment has been planned according to current medical practices, but problems sometimes occur. Call your caregiver if you have any problems or questions after your procedure. °HOME CARE INSTRUCTIONS  °· Do not drive a car or take public transportation alone. °· Do not drink alcohol. °· Only take medicine that has been prescribed by your caregiver. °· Do not sign important papers or make important decisions. °· Have a responsible person with you. °· Ask your caregiver to show you how to check your access at home for a vibration (called a "thrill") or for a sound (called a "bruit" pronounced brew-ee). °· Your vein will need time to enlarge and mature so needles can be inserted for dialysis. Follow your caregiver's instructions about what you need to do to make this happen. °· Keep dressings clean and dry. °· Keep the arm elevated above your heart. Use a pillow. °· Rest. °· Use the arm as usual for all activities. °· Have the stitches or tape closures removed in 10 to 14 days, or as directed by your caregiver. °· Do not sleep or lie on the area of the fistula or that arm. This may decrease or stop the blood flow through your fistula. °· Do not allow blood pressures to be taken on this arm. °· Do not allow blood drawing to be done from the graft. °· Do not wear tight clothing around the access site or on the arm. °· Avoid lifting heavy objects with the arm that has the fistula. °· Do not use creams or lotions over the access site. °SEEK MEDICAL CARE IF:  °· You have a fever. °· You have swelling around the fistula that gets worse, or you have new pain. °· You have unusual bleeding at the fistula site or from any other area. °· You have pus or other drainage at the fistula site. °· You have skin  redness or red streaking on the skin around, above, or below the fistula site. °· Your access site feels warm. °· You have any flu-like symptoms. °SEEK IMMEDIATE MEDICAL CARE IF:  °· You have pain, numbness, or an unusual pale skin on the hand or on the side of your fistula. °· You have dizziness or weakness that you have not had before. °· You have shortness of breath. °· You have chest pain. °· Your fistula disconnects or breaks, and there is bleeding that cannot be easily controlled. °Call for local emergency medical help. Do not try to drive yourself to the hospital. °MAKE SURE YOU °· Understand these instructions. °· Will watch your condition. °· Will get help right away if you are not doing well or get worse. °Document Released: 07/20/2005 Document Revised: 10/12/2011 Document Reviewed: 01/07/2011 °ExitCare® Patient Information ©2014 [employer]. ° °

## 2013-11-08 DIAGNOSIS — N2581 Secondary hyperparathyroidism of renal origin: Secondary | ICD-10-CM | POA: Diagnosis not present

## 2013-11-08 DIAGNOSIS — D509 Iron deficiency anemia, unspecified: Secondary | ICD-10-CM | POA: Diagnosis not present

## 2013-11-08 DIAGNOSIS — N186 End stage renal disease: Secondary | ICD-10-CM | POA: Diagnosis not present

## 2013-11-08 DIAGNOSIS — Z992 Dependence on renal dialysis: Secondary | ICD-10-CM | POA: Diagnosis not present

## 2013-11-10 DIAGNOSIS — N2581 Secondary hyperparathyroidism of renal origin: Secondary | ICD-10-CM | POA: Diagnosis not present

## 2013-11-10 DIAGNOSIS — N186 End stage renal disease: Secondary | ICD-10-CM | POA: Diagnosis not present

## 2013-11-10 DIAGNOSIS — D509 Iron deficiency anemia, unspecified: Secondary | ICD-10-CM | POA: Diagnosis not present

## 2013-11-13 DIAGNOSIS — D509 Iron deficiency anemia, unspecified: Secondary | ICD-10-CM | POA: Diagnosis not present

## 2013-11-13 DIAGNOSIS — N186 End stage renal disease: Secondary | ICD-10-CM | POA: Diagnosis not present

## 2013-11-13 DIAGNOSIS — N2581 Secondary hyperparathyroidism of renal origin: Secondary | ICD-10-CM | POA: Diagnosis not present

## 2013-11-15 DIAGNOSIS — N186 End stage renal disease: Secondary | ICD-10-CM | POA: Diagnosis not present

## 2013-11-15 DIAGNOSIS — D509 Iron deficiency anemia, unspecified: Secondary | ICD-10-CM | POA: Diagnosis not present

## 2013-11-15 DIAGNOSIS — N2581 Secondary hyperparathyroidism of renal origin: Secondary | ICD-10-CM | POA: Diagnosis not present

## 2013-11-17 DIAGNOSIS — D509 Iron deficiency anemia, unspecified: Secondary | ICD-10-CM | POA: Diagnosis not present

## 2013-11-17 DIAGNOSIS — N186 End stage renal disease: Secondary | ICD-10-CM | POA: Diagnosis not present

## 2013-11-17 DIAGNOSIS — N2581 Secondary hyperparathyroidism of renal origin: Secondary | ICD-10-CM | POA: Diagnosis not present

## 2013-11-20 DIAGNOSIS — N186 End stage renal disease: Secondary | ICD-10-CM | POA: Diagnosis not present

## 2013-11-20 DIAGNOSIS — N2581 Secondary hyperparathyroidism of renal origin: Secondary | ICD-10-CM | POA: Diagnosis not present

## 2013-11-20 DIAGNOSIS — D509 Iron deficiency anemia, unspecified: Secondary | ICD-10-CM | POA: Diagnosis not present

## 2013-11-22 DIAGNOSIS — N2581 Secondary hyperparathyroidism of renal origin: Secondary | ICD-10-CM | POA: Diagnosis not present

## 2013-11-22 DIAGNOSIS — D509 Iron deficiency anemia, unspecified: Secondary | ICD-10-CM | POA: Diagnosis not present

## 2013-11-22 DIAGNOSIS — N186 End stage renal disease: Secondary | ICD-10-CM | POA: Diagnosis not present

## 2013-11-24 DIAGNOSIS — N2581 Secondary hyperparathyroidism of renal origin: Secondary | ICD-10-CM | POA: Diagnosis not present

## 2013-11-24 DIAGNOSIS — N186 End stage renal disease: Secondary | ICD-10-CM | POA: Diagnosis not present

## 2013-11-24 DIAGNOSIS — D509 Iron deficiency anemia, unspecified: Secondary | ICD-10-CM | POA: Diagnosis not present

## 2013-11-27 DIAGNOSIS — N2581 Secondary hyperparathyroidism of renal origin: Secondary | ICD-10-CM | POA: Diagnosis not present

## 2013-11-27 DIAGNOSIS — D509 Iron deficiency anemia, unspecified: Secondary | ICD-10-CM | POA: Diagnosis not present

## 2013-11-27 DIAGNOSIS — N186 End stage renal disease: Secondary | ICD-10-CM | POA: Diagnosis not present

## 2013-11-29 DIAGNOSIS — N2581 Secondary hyperparathyroidism of renal origin: Secondary | ICD-10-CM | POA: Diagnosis not present

## 2013-11-29 DIAGNOSIS — N186 End stage renal disease: Secondary | ICD-10-CM | POA: Diagnosis not present

## 2013-11-29 DIAGNOSIS — D509 Iron deficiency anemia, unspecified: Secondary | ICD-10-CM | POA: Diagnosis not present

## 2013-11-30 DIAGNOSIS — N186 End stage renal disease: Secondary | ICD-10-CM | POA: Diagnosis not present

## 2013-12-01 DIAGNOSIS — N2581 Secondary hyperparathyroidism of renal origin: Secondary | ICD-10-CM | POA: Diagnosis not present

## 2013-12-01 DIAGNOSIS — N186 End stage renal disease: Secondary | ICD-10-CM | POA: Diagnosis not present

## 2013-12-01 DIAGNOSIS — D509 Iron deficiency anemia, unspecified: Secondary | ICD-10-CM | POA: Diagnosis not present

## 2013-12-06 DIAGNOSIS — N186 End stage renal disease: Secondary | ICD-10-CM | POA: Diagnosis not present

## 2013-12-06 DIAGNOSIS — Z992 Dependence on renal dialysis: Secondary | ICD-10-CM | POA: Diagnosis not present

## 2013-12-13 ENCOUNTER — Ambulatory Visit: Payer: Medicare Other | Admitting: Cardiology

## 2013-12-15 ENCOUNTER — Encounter: Payer: Self-pay | Admitting: Surgery

## 2013-12-18 ENCOUNTER — Other Ambulatory Visit: Payer: Self-pay

## 2013-12-18 ENCOUNTER — Encounter: Payer: Self-pay | Admitting: Surgery

## 2013-12-18 ENCOUNTER — Ambulatory Visit (INDEPENDENT_AMBULATORY_CARE_PROVIDER_SITE_OTHER): Payer: Medicare Other | Admitting: Surgery

## 2013-12-18 ENCOUNTER — Ambulatory Visit: Payer: Medicare Other | Admitting: Surgery

## 2013-12-18 VITALS — BP 134/62 | HR 82 | Resp 18 | Ht 74.0 in | Wt 289.4 lb

## 2013-12-18 DIAGNOSIS — N186 End stage renal disease: Secondary | ICD-10-CM

## 2013-12-18 NOTE — Progress Notes (Signed)
Patient name: Jacob [name] MRN: [id number] DOB: 1950-12-07 Sex: male     Chief Complaint  Patient presents with  . Follow-up    discuss revision of fistula    HISTORY OF PRESENT ILLNESS: The patient is back today for followup.  He is status post right radiocephalic fistula creation.  He is having trouble with his flow rates.  He underwent fistulogram which showed narrowing of the cephalic vein near the arteriovenous anastomosis.  No stenosis was identified it was a small in caliber.  For that reason no intervention was performed.  He is brought back today for further discussions.  Past Medical History  Diagnosis Date  . Nonischemic cardiomyopathy     Hypertension and regular alcohol use, LVEF 40-45%  . End stage renal disease     Hemodialysis on Monday, Wednesday, Friday  . Chronic systolic heart failure   . Anemia in chronic kidney disease(285.21)   . Type 2 diabetes mellitus   . Anemia of chronic disease   . Essential hypertension, benign   . GERD (gastroesophageal reflux disease)   . CHF (congestive heart failure)   . Shortness of breath     Hx: of with exertion  . Glaucoma     Hx: of    Past Surgical History  Procedure Laterality Date  . Lumbar spine surgery    . Colonoscopy      Hx: of  . Av fistula placement      Hx: of  . Ligation of arteriovenous  fistula Left 05/04/2013    Procedure: LIGATION OF LEFT RADIAL CEPHALIC ARTERIOVENOUS  FISTULA;  Surgeon: Conrad [place], MD;  Location: [place];  Service: Vascular;  Laterality: Left;  Ultrasound guided  . Insertion of dialysis catheter Right 05/04/2013    Procedure: INSERTION OF DIALYSIS CATHETER;  Surgeon: Conrad Dob[place]erry, MD;  Location: [place];  Service: Vascular;  Laterality: Right;  Ultrasound guided  . Fistulogram N/A 05/04/2013    Procedure: CENTRAL VENOGRAM;  Surgeon: Conrad Brewster, MD;  Location: [place];  Service: Vascular;  Laterality: N/A;  . Av fistula placement Right 05/04/2013    Procedure: ARTERIOVENOUS (AV)  FISTULA CREATION- RIGHT ARM;  Surgeon: Conrad [place], MD;  Location: [place];  Service: Vascular;  Laterality: Right;  Ultrasound guided    History   Social History  . Marital Status: Divorced    Spouse Name: N/A    Number of Children: N/A  . Years of Education: N/A   Occupational History  . Not on file.   Social History Main Topics  . Smoking status: Never Smoker   . Smokeless tobacco: Never Used  . Alcohol Use: 1.8 oz/week    3 Shots of liquor per week     Comment: Regular use for years, reports at least a pint per week  . Drug Use: No  . Sexual Activity: Not on file   Other Topics Concern  . Not on file   Social History Narrative  . No narrative on file    Family History  Problem Relation Age of Onset  . Heart attack Brother     20  . Heart disease Brother     before age 83  . Hypertension Brother   . Heart attack Brother     53  . Heart attack Brother     52  . Diabetes Mother   . Hypertension Mother   . Heart disease Father   . Hypertension Father   .  Other Father     amputation  . Hypertension Sister   . Vision loss Maternal Uncle     Allergies as of 12/18/2013 - Review Complete 12/18/2013  Allergen Reaction Noted  . Bee venom Anaphylaxis 10/31/2013  . Penicillins Swelling 11/20/2010    Current Outpatient Prescriptions on File Prior to Visit  Medication Sig Dispense Refill  . aspirin 325 MG tablet Take 325 mg by mouth daily.        . B Complex-C-Folic Acid (DIALYVITE TABLET) TABS Take 1 tablet by mouth every other day.       . B-D ULTRAFINE III SHORT PEN 31G X 8 MM MISC       . carvedilol (COREG) 12.5 MG tablet Take 12.5 mg by mouth 2 (two) times daily with a meal.      . EPIPEN 2-PAK 0.3 MG/0.3ML SOAJ injection Inject 0.3 mg into the muscle once as needed (for bee).       [name] HYDROcodone-acetaminophen (NORCO/VICODIN) 5-325 MG per tablet Take 1 tablet by mouth every 4 (four) hours as needed.       . insulin glargine (LANTUS) 100 UNIT/ML injection  Inject 30 Units into the skin at bedtime.       [name] lanthanum (FOSRENOL) 500 MG chewable tablet Chew 500-2,000 mg by mouth 3 (three) times daily with meals. Take 4 tablets with meals and 1 tablet with snacks      . omeprazole (PRILOSEC) 40 MG capsule Take 40 mg by mouth daily.        . ramipril (ALTACE) 10 MG capsule Take 10 mg by mouth daily.      [name] RENVELA 800 MG tablet Take 800 mg by mouth 3 (three) times daily with meals.       . SENSIPAR 30 MG tablet Take 30 mg by mouth daily with breakfast.        No current facility-administered medications on file prior to visit.     REVIEW OF SYSTEMS: No changes from prior visit  PHYSICAL EXAMINATION:   Vital signs are BP 134/62  Pulse 82  Resp 18  Ht 6\' 2"  (1.88 m)  Wt 289 lb 6.4 oz (131.271 kg)  BMI 37.14 kg/m2 General: The patient appears their stated age. HEENT:  No gross abnormalities Pulmonary:  Non labored breathing Musculoskeletal: There are no major deformities. Neurologic: No focal weakness or paresthesias are detected, Skin: There are no ulcer or rashes noted. Psychiatric: The patient has normal affect. Cardiovascular: Right radiocephalic fistula remains patent   Diagnostic Studies None  Assessment: Decreased flow rates, right radiocephalic fistula Plan: I discussed with the patient that I feel the next option is to proceed with revision of the arterial venous anastomosis.  The cephalic vein appears to be adequate I think a new arteriotomy slightly more proximal in the radial artery would help utilize a better portion of this vein.  I think this can be done and continued to use his fistula without the need for catheter.  This will be scheduled within the next month, at the patient's convenience.  Eldridge Abrahams, M.D. [facility] [place] Office: [phone] Pager:  [phone]

## 2013-12-19 ENCOUNTER — Encounter: Payer: Self-pay | Admitting: Cardiology

## 2013-12-19 ENCOUNTER — Ambulatory Visit (INDEPENDENT_AMBULATORY_CARE_PROVIDER_SITE_OTHER): Payer: Medicare Other | Admitting: Cardiology

## 2013-12-19 VITALS — BP 119/74 | HR 72 | Ht 74.0 in | Wt 276.8 lb

## 2013-12-19 DIAGNOSIS — Z992 Dependence on renal dialysis: Secondary | ICD-10-CM | POA: Diagnosis not present

## 2013-12-19 DIAGNOSIS — I10 Essential (primary) hypertension: Secondary | ICD-10-CM

## 2013-12-19 DIAGNOSIS — I428 Other cardiomyopathies: Secondary | ICD-10-CM

## 2013-12-19 DIAGNOSIS — N186 End stage renal disease: Secondary | ICD-10-CM | POA: Diagnosis not present

## 2013-12-19 NOTE — Assessment & Plan Note (Signed)
Symptomatically stable on medical therapy, LVEF 55-60% as of echocardiogram last year.

## 2013-12-19 NOTE — Assessment & Plan Note (Signed)
Keep followup with Dr. Lowanda Foster.

## 2013-12-19 NOTE — Patient Instructions (Signed)

## 2013-12-19 NOTE — Assessment & Plan Note (Signed)
Blood pressure is normal today. Patient has to hold his antihypertensives on hemodialysis days.

## 2013-12-19 NOTE — Progress Notes (Signed)
Clinical Summary [name] is a 63 y.o.male last seen in November 2014. Records indicate plans for revision of his dialysis access in early June. He reports no new cardiac symptoms, no chest pain or progressive shortness of breath. States that he has been compliant with his medications and hemodialysis sessions.  Echocardiogram in April 2014 revealed moderate LVH with LVEF 0000000, grade 1 diastolic dysfunction, trivial mitral regurgitation, mild left atrial enlargement. Lexiscan Cardiolite in April 2014 showed no diagnostic ST segment changes, overall low risk perfusion study with small, partially reversible lateral apical defect possibly reflecting variable soft tissue attenuation, less likely a small region of ischemia. LVEF was 53% by that study.   Allergies  Allergen Reactions  . Bee Venom Anaphylaxis  . Penicillins Swelling    Swelling of throat and tongue    Current Outpatient Prescriptions  Medication Sig Dispense Refill  . aspirin 325 MG tablet Take 325 mg by mouth daily.        . B Complex-C-Folic Acid (DIALYVITE TABLET) TABS Take 1 tablet by mouth every other day.       . carvedilol (COREG) 12.5 MG tablet Take 12.5 mg by mouth every morning.       [name] EPIPEN 2-PAK 0.3 MG/0.3ML SOAJ injection Inject 0.3 mg into the muscle once as needed (for bee).       [name] HYDROcodone-acetaminophen (NORCO/VICODIN) 5-325 MG per tablet Take 1 tablet by mouth every 4 (four) hours as needed.       . insulin glargine (LANTUS) 100 UNIT/ML injection Inject 30 Units into the skin at bedtime.       [name] lanthanum (FOSRENOL) 500 MG chewable tablet Chew 500-2,000 mg by mouth 3 (three) times daily with meals. Take 4 tablets with meals and 1 tablet with snacks      . omeprazole (PRILOSEC) 40 MG capsule Take 40 mg by mouth daily.        . ramipril (ALTACE) 10 MG capsule Take 10 mg by mouth every evening.       [name] RENVELA 800 MG tablet Take 800 mg by mouth 3 (three) times daily with meals.       . SENSIPAR 30 MG  tablet Take 30 mg by mouth daily with breakfast.        No current facility-administered medications for this visit.    Past Medical History  Diagnosis Date  . Nonischemic cardiomyopathy     Hypertension and regular alcohol use, LVEF 40-45%  . End stage renal disease     Hemodialysis on Monday, Wednesday, Friday  . Chronic systolic heart failure   . Anemia in chronic kidney disease(285.21)   . Type 2 diabetes mellitus   . Anemia of chronic disease   . Essential hypertension, benign   . GERD (gastroesophageal reflux disease)   . CHF (congestive heart failure)   . Shortness of breath     Hx: of with exertion  . Glaucoma     Hx: of    Social History [name] reports that he has never smoked. He has never used smokeless tobacco. [name] reports that he drinks about 1.8 ounces of alcohol per week.  Review of Systems No palpitations or syncope. No claudication. Chronic knee pain. Otherwise negative.  Physical Examination Filed Vitals:   12/19/13 1054  BP: 119/74  Pulse: 72   Filed Weights   12/19/13 1054  Weight: 276 lb 12.8 oz (125.556 kg)    Obese male in no acute distress.  HEENT: Conjunctiva  and lids normal, oropharynx with moist mucosa, poor dentition.  Neck: Supple, no elevated JVP, possible left carotid bruit (prior documentation of mild nonobstructive bilateral ICA disease).  Lungs: Decreased but clear overall, nonlabored.  Cardiac: Indistinct PMI, regular rate and rhythm, no S3, soft systolic murmur.  Abdomen: Nontender, bowel sounds present, no hepatomegaly.  Musculoskeletal: No kyphosis.  Extremities: No distal pitting edema. AV fistula noted in the left forearm without thrill, right arm fistula active.    Problem List and Plan   Nonischemic cardiomyopathy Symptomatically stable on medical therapy, LVEF 55-60% as of echocardiogram last year.  End-stage renal disease on hemodialysis Keep followup with Dr. Lowanda Foster.  Essential hypertension,  benign Blood pressure is normal today. Patient has to hold his antihypertensives on hemodialysis days.    Satira Sark, M.D., F.A.C.C.

## 2013-12-31 DIAGNOSIS — N186 End stage renal disease: Secondary | ICD-10-CM | POA: Diagnosis not present

## 2014-01-01 ENCOUNTER — Encounter (HOSPITAL_COMMUNITY): Payer: Self-pay

## 2014-01-01 ENCOUNTER — Encounter (HOSPITAL_COMMUNITY): Payer: Self-pay | Admitting: *Deleted

## 2014-01-01 DIAGNOSIS — N186 End stage renal disease: Secondary | ICD-10-CM | POA: Diagnosis not present

## 2014-01-01 DIAGNOSIS — D509 Iron deficiency anemia, unspecified: Secondary | ICD-10-CM | POA: Diagnosis not present

## 2014-01-01 DIAGNOSIS — N2581 Secondary hyperparathyroidism of renal origin: Secondary | ICD-10-CM | POA: Diagnosis not present

## 2014-01-01 MED ORDER — VANCOMYCIN HCL 10 G IV SOLR
1500.0000 mg | INTRAVENOUS | Status: AC
  Administered 2014-01-02: 1500 mg via INTRAVENOUS
  Filled 2014-01-01: qty 1500

## 2014-01-02 ENCOUNTER — Encounter (HOSPITAL_COMMUNITY)
Admission: RE | Disposition: A | Discharge status: Home / Self Care | Payer: Medicare Other | Source: Ambulatory Visit | Attending: Vascular Surgery

## 2014-01-02 ENCOUNTER — Telehealth: Payer: Self-pay | Admitting: Vascular Surgery

## 2014-01-02 ENCOUNTER — Encounter (HOSPITAL_COMMUNITY): Payer: Self-pay | Admitting: *Deleted

## 2014-01-02 ENCOUNTER — Encounter (HOSPITAL_COMMUNITY): Payer: Medicare Other | Admitting: Anesthesiology

## 2014-01-02 ENCOUNTER — Ambulatory Visit (HOSPITAL_COMMUNITY): Payer: Medicare Other | Admitting: Anesthesiology

## 2014-01-02 ENCOUNTER — Ambulatory Visit (HOSPITAL_COMMUNITY)
Admission: RE | Admit: 2014-01-02 | Discharge: 2014-01-02 | Disposition: A | Discharge status: Home / Self Care | Payer: Medicare Other | Source: Ambulatory Visit | Attending: Vascular Surgery | Admitting: Vascular Surgery

## 2014-01-02 DIAGNOSIS — I509 Heart failure, unspecified: Secondary | ICD-10-CM | POA: Insufficient documentation

## 2014-01-02 DIAGNOSIS — Z794 Long term (current) use of insulin: Secondary | ICD-10-CM | POA: Insufficient documentation

## 2014-01-02 DIAGNOSIS — K219 Gastro-esophageal reflux disease without esophagitis: Secondary | ICD-10-CM | POA: Insufficient documentation

## 2014-01-02 DIAGNOSIS — N039 Chronic nephritic syndrome with unspecified morphologic changes: Secondary | ICD-10-CM

## 2014-01-02 DIAGNOSIS — E119 Type 2 diabetes mellitus without complications: Secondary | ICD-10-CM | POA: Insufficient documentation

## 2014-01-02 DIAGNOSIS — I428 Other cardiomyopathies: Secondary | ICD-10-CM | POA: Diagnosis not present

## 2014-01-02 DIAGNOSIS — Z7982 Long term (current) use of aspirin: Secondary | ICD-10-CM | POA: Insufficient documentation

## 2014-01-02 DIAGNOSIS — D631 Anemia in chronic kidney disease: Secondary | ICD-10-CM | POA: Insufficient documentation

## 2014-01-02 DIAGNOSIS — I5022 Chronic systolic (congestive) heart failure: Secondary | ICD-10-CM | POA: Diagnosis not present

## 2014-01-02 DIAGNOSIS — Z992 Dependence on renal dialysis: Secondary | ICD-10-CM | POA: Insufficient documentation

## 2014-01-02 DIAGNOSIS — I120 Hypertensive chronic kidney disease with stage 5 chronic kidney disease or end stage renal disease: Secondary | ICD-10-CM | POA: Diagnosis not present

## 2014-01-02 DIAGNOSIS — R0602 Shortness of breath: Secondary | ICD-10-CM | POA: Diagnosis not present

## 2014-01-02 DIAGNOSIS — N186 End stage renal disease: Secondary | ICD-10-CM | POA: Diagnosis not present

## 2014-01-02 DIAGNOSIS — I10 Essential (primary) hypertension: Secondary | ICD-10-CM | POA: Diagnosis not present

## 2014-01-02 DIAGNOSIS — Y838 Other surgical procedures as the cause of abnormal reaction of the patient, or of later complication, without mention of misadventure at the time of the procedure: Secondary | ICD-10-CM | POA: Insufficient documentation

## 2014-01-02 DIAGNOSIS — T82898A Other specified complication of vascular prosthetic devices, implants and grafts, initial encounter: Secondary | ICD-10-CM | POA: Insufficient documentation

## 2014-01-02 DIAGNOSIS — Z79899 Other long term (current) drug therapy: Secondary | ICD-10-CM | POA: Insufficient documentation

## 2014-01-02 DIAGNOSIS — Y929 Unspecified place or not applicable: Secondary | ICD-10-CM | POA: Insufficient documentation

## 2014-01-02 HISTORY — DX: Unspecified osteoarthritis, unspecified site: M19.90

## 2014-01-02 HISTORY — PX: REVISON OF ARTERIOVENOUS FISTULA: SHX6074

## 2014-01-02 LAB — GLUCOSE, CAPILLARY: GLUCOSE-CAPILLARY: 124 mg/dL — AB (ref 70–99)

## 2014-01-02 LAB — POCT I-STAT 4, (NA,K, GLUC, HGB,HCT)
Glucose, Bld: 170 mg/dL — ABNORMAL HIGH (ref 70–99)
HCT: 37 % — ABNORMAL LOW (ref 39.0–52.0)
Hemoglobin: 12.6 g/dL — ABNORMAL LOW (ref 13.0–17.0)
Potassium: 4.1 mEq/L (ref 3.7–5.3)
Sodium: 139 mEq/L (ref 137–147)

## 2014-01-02 SURGERY — REVISON OF ARTERIOVENOUS FISTULA
Anesthesia: Monitor Anesthesia Care | Site: Arm Lower | Laterality: Right

## 2014-01-02 MED ORDER — OXYCODONE HCL 5 MG/5ML PO SOLN: 5.0000 mg | Freq: Once | ORAL | Status: DC | PRN

## 2014-01-02 MED ORDER — GLYCOPYRROLATE 0.2 MG/ML IJ SOLN
INTRAMUSCULAR | Status: AC
  Filled 2014-01-02: qty 1

## 2014-01-02 MED ORDER — OXYCODONE HCL 5 MG PO TABS
5.0000 mg | ORAL_TABLET | Freq: Once | ORAL | Status: DC | PRN
Start: 2014-01-02 — End: 2014-01-02

## 2014-01-02 MED ORDER — BUPIVACAINE HCL (PF) 0.5 % IJ SOLN
INTRAMUSCULAR | Status: DC | PRN
  Administered 2014-01-02: 3 mL

## 2014-01-02 MED ORDER — EPHEDRINE SULFATE 50 MG/ML IJ SOLN
INTRAMUSCULAR | Status: AC
  Filled 2014-01-02: qty 1

## 2014-01-02 MED ORDER — LIDOCAINE HCL (CARDIAC) 20 MG/ML IV SOLN
INTRAVENOUS | Status: AC
  Filled 2014-01-02: qty 5

## 2014-01-02 MED ORDER — FENTANYL CITRATE 0.05 MG/ML IJ SOLN
INTRAMUSCULAR | Status: AC
  Filled 2014-01-02: qty 5

## 2014-01-02 MED ORDER — HYDROMORPHONE HCL PF 1 MG/ML IJ SOLN: 0.2500 mg | INTRAMUSCULAR | Status: DC | PRN

## 2014-01-02 MED ORDER — SODIUM CHLORIDE 0.9 % IV SOLN
INTRAVENOUS | Status: DC | PRN
  Administered 2014-01-02: 07:00:00 via INTRAVENOUS

## 2014-01-02 MED ORDER — CHLORHEXIDINE GLUCONATE CLOTH 2 % EX PADS: 6.0000 | MEDICATED_PAD | Freq: Once | CUTANEOUS | Status: DC

## 2014-01-02 MED ORDER — ROCURONIUM BROMIDE 50 MG/5ML IV SOLN
INTRAVENOUS | Status: AC
  Filled 2014-01-02: qty 1

## 2014-01-02 MED ORDER — PROPOFOL 10 MG/ML IV BOLUS
INTRAVENOUS | Status: AC
  Filled 2014-01-02: qty 20

## 2014-01-02 MED ORDER — PROMETHAZINE HCL 25 MG/ML IJ SOLN: 6.2500 mg | INTRAMUSCULAR | Status: DC | PRN

## 2014-01-02 MED ORDER — SUCCINYLCHOLINE CHLORIDE 20 MG/ML IJ SOLN
INTRAMUSCULAR | Status: AC
  Filled 2014-01-02: qty 1

## 2014-01-02 MED ORDER — SODIUM CHLORIDE 0.9 % IR SOLN
Status: DC | PRN
  Administered 2014-01-02: 07:00:00

## 2014-01-02 MED ORDER — PROPOFOL INFUSION 10 MG/ML OPTIME
INTRAVENOUS | Status: DC | PRN
  Administered 2014-01-02: 25 ug/kg/min via INTRAVENOUS

## 2014-01-02 MED ORDER — ONDANSETRON HCL 4 MG/2ML IJ SOLN
INTRAMUSCULAR | Status: DC | PRN
  Administered 2014-01-02: 4 mg via INTRAVENOUS

## 2014-01-02 MED ORDER — LIDOCAINE HCL (CARDIAC) 20 MG/ML IV SOLN
INTRAVENOUS | Status: DC | PRN
  Administered 2014-01-02: 40 mg via INTRAVENOUS

## 2014-01-02 MED ORDER — MIDAZOLAM HCL 2 MG/2ML IJ SOLN
INTRAMUSCULAR | Status: AC
  Filled 2014-01-02: qty 2

## 2014-01-02 MED ORDER — THROMBIN 20000 UNITS EX KIT
PACK | CUTANEOUS | Status: DC | PRN
  Administered 2014-01-02: 09:00:00 via TOPICAL

## 2014-01-02 MED ORDER — OXYCODONE HCL 5 MG PO TABS
5.0000 mg | ORAL_TABLET | ORAL | Status: DC | PRN
Start: 2014-01-02 — End: 2015-01-24

## 2014-01-02 MED ORDER — THROMBIN 20000 UNITS EX SOLR
CUTANEOUS | Status: AC
  Filled 2014-01-02: qty 20000

## 2014-01-02 MED ORDER — HEPARIN SODIUM (PORCINE) 1000 UNIT/ML IJ SOLN
INTRAMUSCULAR | Status: AC
  Filled 2014-01-02: qty 1

## 2014-01-02 MED ORDER — SODIUM CHLORIDE 0.9 % IV SOLN: INTRAVENOUS | Status: DC

## 2014-01-02 MED ORDER — 0.9 % SODIUM CHLORIDE (POUR BTL) OPTIME
TOPICAL | Status: DC | PRN
  Administered 2014-01-02: 1000 mL

## 2014-01-02 MED ORDER — LIDOCAINE-EPINEPHRINE (PF) 1 %-1:200000 IJ SOLN
INTRAMUSCULAR | Status: DC | PRN
  Administered 2014-01-02: 3 mL via INTRADERMAL

## 2014-01-02 MED ORDER — PHENYLEPHRINE 40 MCG/ML (10ML) SYRINGE FOR IV PUSH (FOR BLOOD PRESSURE SUPPORT)
PREFILLED_SYRINGE | INTRAVENOUS | Status: AC
  Filled 2014-01-02: qty 10

## 2014-01-02 MED ORDER — ONDANSETRON HCL 4 MG/2ML IJ SOLN
INTRAMUSCULAR | Status: AC
  Filled 2014-01-02: qty 2

## 2014-01-02 MED ORDER — MIDAZOLAM HCL 5 MG/5ML IJ SOLN
INTRAMUSCULAR | Status: DC | PRN
  Administered 2014-01-02: 2 mg via INTRAVENOUS

## 2014-01-02 MED ORDER — SODIUM CHLORIDE 0.9 % IJ SOLN
INTRAMUSCULAR | Status: AC
  Filled 2014-01-02: qty 10

## 2014-01-02 SURGICAL SUPPLY — 36 items
ADH SKN CLS APL DERMABOND .7 (GAUZE/BANDAGES/DRESSINGS) ×2
BLADE 10 SAFETY STRL DISP (BLADE) ×3 IMPLANT
CANISTER SUCTION 2500CC (MISCELLANEOUS) ×3 IMPLANT
CLIP TI MEDIUM 6 (CLIP) ×3 IMPLANT
CLIP TI WIDE RED SMALL 6 (CLIP) ×3 IMPLANT
COVER PROBE W GEL 5X96 (DRAPES) IMPLANT
COVER SURGICAL LIGHT HANDLE (MISCELLANEOUS) ×3 IMPLANT
DECANTER SPIKE VIAL GLASS SM (MISCELLANEOUS) ×3 IMPLANT
DERMABOND ADVANCED (GAUZE/BANDAGES/DRESSINGS) ×4
DERMABOND ADVANCED .7 DNX12 (GAUZE/BANDAGES/DRESSINGS) ×1 IMPLANT
DRAIN PENROSE 1/2X12 LTX STRL (WOUND CARE) IMPLANT
ELECT REM PT RETURN 9FT ADLT (ELECTROSURGICAL) ×3
ELECTRODE REM PT RTRN 9FT ADLT (ELECTROSURGICAL) ×1 IMPLANT
GLOVE BIO SURGEON STRL SZ7 (GLOVE) ×3 IMPLANT
GLOVE BIOGEL PI IND STRL 7.5 (GLOVE) ×1 IMPLANT
GLOVE BIOGEL PI INDICATOR 7.5 (GLOVE) ×2
GLOVE ECLIPSE 7.5 STRL STRAW (GLOVE) ×4 IMPLANT
GOWN STRL REUS W/ TWL LRG LVL3 (GOWN DISPOSABLE) ×3 IMPLANT
GOWN STRL REUS W/ TWL XL LVL3 (GOWN DISPOSABLE) IMPLANT
GOWN STRL REUS W/TWL LRG LVL3 (GOWN DISPOSABLE) ×6
GOWN STRL REUS W/TWL XL LVL3 (GOWN DISPOSABLE) ×3
KIT BASIN OR (CUSTOM PROCEDURE TRAY) ×3 IMPLANT
KIT ROOM TURNOVER OR (KITS) ×3 IMPLANT
NS IRRIG 1000ML POUR BTL (IV SOLUTION) ×3 IMPLANT
PACK CV ACCESS (CUSTOM PROCEDURE TRAY) ×3 IMPLANT
PAD ARMBOARD 7.5X6 YLW CONV (MISCELLANEOUS) ×6 IMPLANT
SPONGE SURGIFOAM ABS GEL 100 (HEMOSTASIS) IMPLANT
SUT MNCRL AB 4-0 PS2 18 (SUTURE) ×5 IMPLANT
SUT PROLENE 6 0 BV (SUTURE) ×4 IMPLANT
SUT PROLENE 7 0 BV 1 (SUTURE) ×1 IMPLANT
SUT VIC AB 3-0 SH 27 (SUTURE) ×3
SUT VIC AB 3-0 SH 27X BRD (SUTURE) ×1 IMPLANT
TOWEL OR 17X24 6PK STRL BLUE (TOWEL DISPOSABLE) ×3 IMPLANT
TOWEL OR 17X26 10 PK STRL BLUE (TOWEL DISPOSABLE) ×3 IMPLANT
UNDERPAD 30X30 INCONTINENT (UNDERPADS AND DIAPERS) ×3 IMPLANT
WATER STERILE IRR 1000ML POUR (IV SOLUTION) ×3 IMPLANT

## 2014-01-02 NOTE — H&P (View-Only) (Signed)
Patient name: Jacob [name] MRN: [id number] DOB: Apr 22, 1951 Sex: male     Chief Complaint  Patient presents with  . Follow-up    discuss revision of fistula    HISTORY OF PRESENT ILLNESS: The patient is back today for followup.  He is status post right radiocephalic fistula creation.  He is having trouble with his flow rates.  He underwent fistulogram which showed narrowing of the cephalic vein near the arteriovenous anastomosis.  No stenosis was identified it was a small in caliber.  For that reason no intervention was performed.  He is brought back today for further discussions.  Past Medical History  Diagnosis Date  . Nonischemic cardiomyopathy     Hypertension and regular alcohol use, LVEF 40-45%  . End stage renal disease     Hemodialysis on Monday, Wednesday, Friday  . Chronic systolic heart failure   . Anemia in chronic kidney disease(285.21)   . Type 2 diabetes mellitus   . Anemia of chronic disease   . Essential hypertension, benign   . GERD (gastroesophageal reflux disease)   . CHF (congestive heart failure)   . Shortness of breath     Hx: of with exertion  . Glaucoma     Hx: of    Past Surgical History  Procedure Laterality Date  . Lumbar spine surgery    . Colonoscopy      Hx: of  . Av fistula placement      Hx: of  . Ligation of arteriovenous  fistula Left 05/04/2013    Procedure: LIGATION OF LEFT RADIAL CEPHALIC ARTERIOVENOUS  FISTULA;  Surgeon: Conrad Fontanelle, MD;  Location: [place];  Service: Vascular;  Laterality: Left;  Ultrasound guided  . Insertion of dialysis catheter Right 05/04/2013    Procedure: INSERTION OF DIALYSIS CATHETER;  Surgeon: Conrad Richgrove, MD;  Location: [place];  Service: Vascular;  Laterality: Right;  Ultrasound guided  . Fistulogram N/A 05/04/2013    Procedure: CENTRAL VENOGRAM;  Surgeon: Conrad Monroe, MD;  Location: [place];  Service: Vascular;  Laterality: N/A;  . Av fistula placement Right 05/04/2013    Procedure: ARTERIOVENOUS (AV)  FISTULA CREATION- RIGHT ARM;  Surgeon: Conrad Cranberry [place], MD;  Location: [place];  Service: Vascular;  Laterality: Right;  Ultrasound guided    History   Social History  . Marital Status: Divorced    Spouse Name: N/A    Number of Children: N/A  . Years of Education: N/A   Occupational History  . Not on file.   Social History Main Topics  . Smoking status: Never Smoker   . Smokeless tobacco: Never Used  . Alcohol Use: 1.8 oz/week    3 Shots of liquor per week     Comment: Regular use for years, reports at least a pint per week  . Drug Use: No  . Sexual Activity: Not on file   Other Topics Concern  . Not on file   Social History Narrative  . No narrative on file    Family History  Problem Relation Age of Onset  . Heart attack Brother     20  . Heart disease Brother     before age 81  . Hypertension Brother   . Heart attack Brother     75  . Heart attack Brother     83  . Diabetes Mother   . Hypertension Mother   . Heart disease Father   . Hypertension Father   .  Other Father     amputation  . Hypertension Sister   . Vision loss Maternal Uncle     Allergies as of 12/18/2013 - Review Complete 12/18/2013  Allergen Reaction Noted  . Bee venom Anaphylaxis 10/31/2013  . Penicillins Swelling 11/20/2010    Current Outpatient Prescriptions on File Prior to Visit  Medication Sig Dispense Refill  . aspirin 325 MG tablet Take 325 mg by mouth daily.        . B Complex-C-Folic Acid (DIALYVITE TABLET) TABS Take 1 tablet by mouth every other day.       . B-D ULTRAFINE III SHORT PEN 31G X 8 MM MISC       . carvedilol (COREG) 12.5 MG tablet Take 12.5 mg by mouth 2 (two) times daily with a meal.      . EPIPEN 2-PAK 0.3 MG/0.3ML SOAJ injection Inject 0.3 mg into the muscle once as needed (for bee).       [name] HYDROcodone-acetaminophen (NORCO/VICODIN) 5-325 MG per tablet Take 1 tablet by mouth every 4 (four) hours as needed.       . insulin glargine (LANTUS) 100 UNIT/ML injection  Inject 30 Units into the skin at bedtime.       [name] lanthanum (FOSRENOL) 500 MG chewable tablet Chew 500-2,000 mg by mouth 3 (three) times daily with meals. Take 4 tablets with meals and 1 tablet with snacks      . omeprazole (PRILOSEC) 40 MG capsule Take 40 mg by mouth daily.        . ramipril (ALTACE) 10 MG capsule Take 10 mg by mouth daily.      [name] RENVELA 800 MG tablet Take 800 mg by mouth 3 (three) times daily with meals.       . SENSIPAR 30 MG tablet Take 30 mg by mouth daily with breakfast.        No current facility-administered medications on file prior to visit.     REVIEW OF SYSTEMS: No changes from prior visit  PHYSICAL EXAMINATION:   Vital signs are BP 134/62  Pulse 82  Resp 18  Ht 6\' 2"  (1.88 m)  Wt 289 lb 6.4 oz (131.271 kg)  BMI 37.14 kg/m2 General: The patient appears their stated age. HEENT:  No gross abnormalities Pulmonary:  Non labored breathing Musculoskeletal: There are no major deformities. Neurologic: No focal weakness or paresthesias are detected, Skin: There are no ulcer or rashes noted. Psychiatric: The patient has normal affect. Cardiovascular: Right radiocephalic fistula remains patent   Diagnostic Studies None  Assessment: Decreased flow rates, right radiocephalic fistula Plan: I discussed with the patient that I feel the next option is to proceed with revision of the arterial venous anastomosis.  The cephalic vein appears to be adequate I think a new arteriotomy slightly more proximal in the radial artery would help utilize a better portion of this vein.  I think this can be done and continued to use his fistula without the need for catheter.  This will be scheduled within the next month, at the patient's convenience.  Eldridge Abrahams, M.D. [facility] Office: [phone] Pager:  [phone]

## 2014-01-02 NOTE — Interval H&P Note (Signed)
History and Physical Interval Note:  01/02/2014 7:12 AM  Jacob [name]  has presented today for surgery, with the diagnosis of End stage renal disease  The various methods of treatment have been discussed with the patient and family. After consideration of risks, benefits and other options for treatment, the patient has consented to  Procedure(s): REVISON OF ARTERIOVENOUS FISTULA ANASTOMOSIS (Right) as a surgical intervention .  The patient's history has been reviewed, patient examined, no change in status, stable for surgery.  I have reviewed the patient's chart and labs.  Questions were answered to the patient's satisfaction.     Conrad Cache

## 2014-01-02 NOTE — Discharge Instructions (Signed)

## 2014-01-02 NOTE — Addendum Note (Signed)
Addendum created 01/02/14 1134 by Jenne Campus, CRNA   Modules edited: Anesthesia Medication Administration

## 2014-01-02 NOTE — Anesthesia Preprocedure Evaluation (Addendum)
Anesthesia Evaluation  Patient identified by MRN, date of birth, ID band Patient awake    Reviewed: Allergy & Precautions, H&P , NPO status , Patient's Chart, lab work & pertinent test results, reviewed documented beta blocker date and time   History of Anesthesia Complications Negative for: history of anesthetic complications  Airway Mallampati: II TM Distance: >3 FB Neck ROM: Full    Dental  (+) Edentulous Upper, Dental Advisory Given   Pulmonary neg pulmonary ROS,  breath sounds clear to auscultation  Pulmonary exam normal       Cardiovascular hypertension, Pt. on home beta blockers and Pt. on medications +CHF Rhythm:Regular Rate:Normal     Neuro/Psych negative neurological ROS  negative psych ROS   GI/Hepatic Neg liver ROS, GERD-  Medicated and Controlled,  Endo/Other  diabetes, Type 2, Insulin DependentCBG 170  Renal/GU Dialysis and ESRFRenal diseaseLast Hd Mon 01/01/14     Musculoskeletal   Abdominal   Peds  Hematology   Anesthesia Other Findings   Reproductive/Obstetrics negative OB ROS                        Anesthesia Physical Anesthesia Plan  ASA: III  Anesthesia Plan: MAC   Post-op Pain Management:    Induction: Intravenous  Airway Management Planned:   Additional Equipment:   Intra-op Plan:   Post-operative Plan:   Informed Consent: I have reviewed the patients History and Physical, chart, labs and discussed the procedure including the risks, benefits and alternatives for the proposed anesthesia with the patient or authorized representative who has indicated his/her understanding and acceptance.   Dental advisory given  Plan Discussed with: CRNA, Anesthesiologist and Surgeon  Anesthesia Plan Comments:        Anesthesia Quick Evaluation

## 2014-01-02 NOTE — Telephone Encounter (Addendum)
Message copied by Doristine Section on Tue Jan 02, 2014 12:12 PM ------      Message from: Mena Goes      Created: Tue Jan 02, 2014  9:47 AM      Regarding: Schedule                   ----- Message -----         From: Conrad Kitsap, MD         Sent: 01/02/2014   9:29 AM           To: [address]            ABOU PRICHETT      [id number]      03-Dec-1950            PROCEDURE:      Revision of right radiocephalic arteriovenous fistula with revision of anastomosis            Follow-up: 2 weeks       ------  notified patient of post op appt. on 01-26-14 at 3:00 with dr. Bridgett Larsson

## 2014-01-02 NOTE — Op Note (Signed)
OPERATIVE NOTE   PROCEDURE: Revision of right radiocephalic arteriovenous fistula with revision of anastomosis  PRE-OPERATIVE DIAGNOSIS: Poorly functioning right radiocephalic arteriovenous fistula    POST-OPERATIVE DIAGNOSIS: same as above   SURGEON: [name], MD  ANESTHESIA: local and MAC  ESTIMATED BLOOD LOSS: 50 cc  FINDING(S): 1. Severely calcified and diseased radial artery 2. Faintly thrill in fistula at end of case 3. Dopplerable radial signal at end of case  SPECIMEN(S):  none  INDICATIONS:   Jacob [name] is a 63 y.o. male who presents with poorly function right radiocephalic arteriovenous fistula.  On a prior fistulogram, the distal fistula appeared to be sclerotic so Dr. Trula Slade recommended revision of the anastomosis.  Risk, benefits, and alternatives to access surgery were discussed.  The patient is aware the risks include but are not limited to: bleeding, infection, steal syndrome, nerve damage, ischemic monomelic neuropathy, failure to mature, need for additional procedures, death and stroke.  The patient agrees to proceed forward with the procedure.  DESCRIPTION: After obtaining full informed written consent, the patient was brought back to the operating room and placed supine upon the operating table.  The patient received IV antibiotics prior to induction.  After obtaining adequate anesthesia, the patient was prepped and draped in the standard fashion for: right arm access procedure.  Under Sonosite guidance, I marked out the fistula and radial artery.  I made longitudinal incisions to dissect out both.  The radial artery was 3-4 mm in diameter but severely calcified.  The fistula distally appeared to be 4 mm externally.  Based on the fistulogram, the acceptable segment of the fistula was in its straight segment, so I tied of the fistula distally just proximal to the prior anastomosis.  I transected the fistula proximal to the ligature, leaving a vein patch on the  radial artery.  I transected the fistula just distal to the straight segment.  The fistula had a 5-6 mm lumen in the remaining segment.  I tunneled through the short length of subcutaneous tissue and delivered the distal cephalic vein in the radial artery exposure.  I clamped the radial artery proximally and distally and made an arteriotomy.  I had to adjust the clamps due to the severe calcification.  I extended the arteriotomy proximal and distally for a 4 mm arteriotomy.  In interrogating the radial artery, there was an extensive calcified plaque in the lumen of the radial artery.  I had to extract some fragments of the plaque that had loosened up due to clamping.  I sewed the cephalic vein to the radial artery in an end-to-side configuration with a running stitch of 6-0 Prolene.  Prior to completion of the anastomosis, I extensively bled the radial artery from both ends, there was good vigorous bleeding from both ends despite the calcification.  I completed the anastomosis in the usual fashion.  All clamps were removed.  Immediately, there was a weak thrill in the fistula with some pulsatile character.  This was confirmed with the doppler.  I placed thrombin and gelfoam in both incisions.  After waiting a few minutes, no further active bleeding was present.  I reapproximated both incisions with a layer of 3-0 Vicryl in the subcutaneous layer and a running subcuticular stitch of 4-0 Monocryl in the skin.  The skin was cleaned, dried, and reinforced with Dermabond.    COMPLICATIONS: none  CONDITION: stable  [name], MD [facility] Office: [phone] Pager: [phone]  01/02/2014, 9:18  AM

## 2014-01-02 NOTE — Progress Notes (Signed)
All documentation in this column done by Veryl Speak RN

## 2014-01-02 NOTE — Anesthesia Postprocedure Evaluation (Signed)
Anesthesia Post Note  Patient: Jacob [name]  Procedure(s) Performed: Procedure(s) (LRB): REVISON OF ARTERIOVENOUS FISTULA ANASTOMOSIS (Right)  Anesthesia type: MAC  Patient location: PACU  Post pain: Pain level controlled  Post assessment: Patient's Cardiovascular Status Stable  Last Vitals:  Filed Vitals:   01/02/14 1029  BP: 148/52  Pulse:   Temp: 36.2 C  Resp: 15    Post vital signs: Reviewed and stable  Level of consciousness: sedated  Complications: No apparent anesthesia complications

## 2014-01-02 NOTE — Progress Notes (Signed)
Called Dr.Singer for sign out

## 2014-01-02 NOTE — Transfer of Care (Signed)
Immediate Anesthesia Transfer of Care Note  Patient: Jacob [name]  Procedure(s) Performed: Procedure(s): REVISON OF ARTERIOVENOUS FISTULA ANASTOMOSIS (Right)  Patient Location: PACU  Anesthesia Type:MAC  Level of Consciousness: awake, alert , oriented and patient cooperative  Airway & Oxygen Therapy: Patient Spontanous Breathing  Post-op Assessment: Report given to PACU RN and Post -op Vital signs reviewed and stable  Post vital signs: Reviewed  Complications: No apparent anesthesia complications

## 2014-01-03 DIAGNOSIS — Z992 Dependence on renal dialysis: Secondary | ICD-10-CM | POA: Diagnosis not present

## 2014-01-03 DIAGNOSIS — N186 End stage renal disease: Secondary | ICD-10-CM | POA: Diagnosis not present

## 2014-01-03 DIAGNOSIS — E119 Type 2 diabetes mellitus without complications: Secondary | ICD-10-CM | POA: Diagnosis not present

## 2014-01-03 MED FILL — Thrombin For Soln 20000 Unit: CUTANEOUS | Qty: 1 | Status: AC

## 2014-01-05 ENCOUNTER — Encounter (HOSPITAL_COMMUNITY): Payer: Self-pay | Admitting: Vascular Surgery

## 2014-01-26 ENCOUNTER — Encounter: Payer: Medicare Other | Admitting: Vascular Surgery

## 2014-01-30 DIAGNOSIS — N186 End stage renal disease: Secondary | ICD-10-CM | POA: Diagnosis not present

## 2014-01-31 DIAGNOSIS — D509 Iron deficiency anemia, unspecified: Secondary | ICD-10-CM | POA: Diagnosis not present

## 2014-01-31 DIAGNOSIS — N2581 Secondary hyperparathyroidism of renal origin: Secondary | ICD-10-CM | POA: Diagnosis not present

## 2014-01-31 DIAGNOSIS — N186 End stage renal disease: Secondary | ICD-10-CM | POA: Diagnosis not present

## 2014-02-07 DIAGNOSIS — Z992 Dependence on renal dialysis: Secondary | ICD-10-CM | POA: Diagnosis not present

## 2014-02-07 DIAGNOSIS — N186 End stage renal disease: Secondary | ICD-10-CM | POA: Diagnosis not present

## 2014-02-15 ENCOUNTER — Encounter: Payer: Self-pay | Admitting: Vascular Surgery

## 2014-02-16 ENCOUNTER — Ambulatory Visit (INDEPENDENT_AMBULATORY_CARE_PROVIDER_SITE_OTHER): Payer: Self-pay | Admitting: Vascular Surgery

## 2014-02-16 ENCOUNTER — Encounter: Payer: Self-pay | Admitting: Vascular Surgery

## 2014-02-16 VITALS — BP 147/73 | HR 78 | Resp 18 | Ht 74.0 in | Wt 292.0 lb

## 2014-02-16 DIAGNOSIS — N186 End stage renal disease: Secondary | ICD-10-CM

## 2014-02-16 NOTE — Progress Notes (Signed)
    Postoperative Access Visit   History of Present Illness  Jacob [name] is a 63 y.o. year old male who presents for postoperative follow-up for: Revision of R RC AVF with new anastomosis (Date: 01/02/14).  The patient's wounds are healed.  The patient notes no steal symptoms.  The patient is able to complete their activities of daily living.  The patient's current symptoms are: none.  They are already using his R RC AVF for hemodialysis.  The flow rates have been good.  For VQI Use Only  PRE-ADM LIVING: Home  AMB STATUS: Ambulatory  Physical Examination There were no vitals filed for this visit.  RUE: Incision is healed, skin feels warm, hand grip is 5/5, sensation in digits is intact, palpable thrill, bruit can be auscultated   Medical Decision Making  Jacob [name] is a 63 y.o. year old male who presents s/p Revision of R RC AVF with new anastomosis   The R RC AVF is already being used without any issues.    Thank you for allowing [place] to participate in this patient's care.  [name], MD [facility] Office: [phone] Pager: [phone]  02/16/2014, 3:44 PM

## 2014-03-02 DIAGNOSIS — N186 End stage renal disease: Secondary | ICD-10-CM | POA: Diagnosis not present

## 2014-03-05 DIAGNOSIS — D509 Iron deficiency anemia, unspecified: Secondary | ICD-10-CM | POA: Diagnosis not present

## 2014-03-05 DIAGNOSIS — N186 End stage renal disease: Secondary | ICD-10-CM | POA: Diagnosis not present

## 2014-03-05 DIAGNOSIS — N2581 Secondary hyperparathyroidism of renal origin: Secondary | ICD-10-CM | POA: Diagnosis not present

## 2014-03-07 DIAGNOSIS — N186 End stage renal disease: Secondary | ICD-10-CM | POA: Diagnosis not present

## 2014-03-07 DIAGNOSIS — Z992 Dependence on renal dialysis: Secondary | ICD-10-CM | POA: Diagnosis not present

## 2014-04-02 DIAGNOSIS — N186 End stage renal disease: Secondary | ICD-10-CM | POA: Diagnosis not present

## 2014-04-04 DIAGNOSIS — D509 Iron deficiency anemia, unspecified: Secondary | ICD-10-CM | POA: Diagnosis not present

## 2014-04-04 DIAGNOSIS — N186 End stage renal disease: Secondary | ICD-10-CM | POA: Diagnosis not present

## 2014-04-04 DIAGNOSIS — N2581 Secondary hyperparathyroidism of renal origin: Secondary | ICD-10-CM | POA: Diagnosis not present

## 2014-04-06 DIAGNOSIS — D509 Iron deficiency anemia, unspecified: Secondary | ICD-10-CM | POA: Diagnosis not present

## 2014-04-06 DIAGNOSIS — N186 End stage renal disease: Secondary | ICD-10-CM | POA: Diagnosis not present

## 2014-04-06 DIAGNOSIS — N2581 Secondary hyperparathyroidism of renal origin: Secondary | ICD-10-CM | POA: Diagnosis not present

## 2014-04-09 DIAGNOSIS — N2581 Secondary hyperparathyroidism of renal origin: Secondary | ICD-10-CM | POA: Diagnosis not present

## 2014-04-09 DIAGNOSIS — N186 End stage renal disease: Secondary | ICD-10-CM | POA: Diagnosis not present

## 2014-04-09 DIAGNOSIS — D509 Iron deficiency anemia, unspecified: Secondary | ICD-10-CM | POA: Diagnosis not present

## 2014-04-11 DIAGNOSIS — E119 Type 2 diabetes mellitus without complications: Secondary | ICD-10-CM | POA: Diagnosis not present

## 2014-04-11 DIAGNOSIS — N186 End stage renal disease: Secondary | ICD-10-CM | POA: Diagnosis not present

## 2014-04-11 DIAGNOSIS — N2581 Secondary hyperparathyroidism of renal origin: Secondary | ICD-10-CM | POA: Diagnosis not present

## 2014-04-11 DIAGNOSIS — Z794 Long term (current) use of insulin: Secondary | ICD-10-CM | POA: Diagnosis not present

## 2014-04-11 DIAGNOSIS — D509 Iron deficiency anemia, unspecified: Secondary | ICD-10-CM | POA: Diagnosis not present

## 2014-04-13 DIAGNOSIS — N186 End stage renal disease: Secondary | ICD-10-CM | POA: Diagnosis not present

## 2014-04-13 DIAGNOSIS — D509 Iron deficiency anemia, unspecified: Secondary | ICD-10-CM | POA: Diagnosis not present

## 2014-04-13 DIAGNOSIS — N2581 Secondary hyperparathyroidism of renal origin: Secondary | ICD-10-CM | POA: Diagnosis not present

## 2014-04-16 DIAGNOSIS — N2581 Secondary hyperparathyroidism of renal origin: Secondary | ICD-10-CM | POA: Diagnosis not present

## 2014-04-16 DIAGNOSIS — D509 Iron deficiency anemia, unspecified: Secondary | ICD-10-CM | POA: Diagnosis not present

## 2014-04-16 DIAGNOSIS — N186 End stage renal disease: Secondary | ICD-10-CM | POA: Diagnosis not present

## 2014-04-18 DIAGNOSIS — N2581 Secondary hyperparathyroidism of renal origin: Secondary | ICD-10-CM | POA: Diagnosis not present

## 2014-04-18 DIAGNOSIS — N186 End stage renal disease: Secondary | ICD-10-CM | POA: Diagnosis not present

## 2014-04-18 DIAGNOSIS — D509 Iron deficiency anemia, unspecified: Secondary | ICD-10-CM | POA: Diagnosis not present

## 2014-04-20 DIAGNOSIS — N186 End stage renal disease: Secondary | ICD-10-CM | POA: Diagnosis not present

## 2014-04-20 DIAGNOSIS — N2581 Secondary hyperparathyroidism of renal origin: Secondary | ICD-10-CM | POA: Diagnosis not present

## 2014-04-20 DIAGNOSIS — D509 Iron deficiency anemia, unspecified: Secondary | ICD-10-CM | POA: Diagnosis not present

## 2014-04-23 DIAGNOSIS — D509 Iron deficiency anemia, unspecified: Secondary | ICD-10-CM | POA: Diagnosis not present

## 2014-04-23 DIAGNOSIS — N2581 Secondary hyperparathyroidism of renal origin: Secondary | ICD-10-CM | POA: Diagnosis not present

## 2014-04-23 DIAGNOSIS — N186 End stage renal disease: Secondary | ICD-10-CM | POA: Diagnosis not present

## 2014-04-25 DIAGNOSIS — D509 Iron deficiency anemia, unspecified: Secondary | ICD-10-CM | POA: Diagnosis not present

## 2014-04-25 DIAGNOSIS — N186 End stage renal disease: Secondary | ICD-10-CM | POA: Diagnosis not present

## 2014-04-25 DIAGNOSIS — N2581 Secondary hyperparathyroidism of renal origin: Secondary | ICD-10-CM | POA: Diagnosis not present

## 2014-04-27 DIAGNOSIS — D509 Iron deficiency anemia, unspecified: Secondary | ICD-10-CM | POA: Diagnosis not present

## 2014-04-27 DIAGNOSIS — N186 End stage renal disease: Secondary | ICD-10-CM | POA: Diagnosis not present

## 2014-04-27 DIAGNOSIS — N2581 Secondary hyperparathyroidism of renal origin: Secondary | ICD-10-CM | POA: Diagnosis not present

## 2014-04-30 DIAGNOSIS — D509 Iron deficiency anemia, unspecified: Secondary | ICD-10-CM | POA: Diagnosis not present

## 2014-04-30 DIAGNOSIS — N186 End stage renal disease: Secondary | ICD-10-CM | POA: Diagnosis not present

## 2014-04-30 DIAGNOSIS — N2581 Secondary hyperparathyroidism of renal origin: Secondary | ICD-10-CM | POA: Diagnosis not present

## 2014-04-30 DIAGNOSIS — Z992 Dependence on renal dialysis: Secondary | ICD-10-CM | POA: Diagnosis not present

## 2014-05-02 DIAGNOSIS — N186 End stage renal disease: Secondary | ICD-10-CM | POA: Diagnosis not present

## 2014-05-02 DIAGNOSIS — N2581 Secondary hyperparathyroidism of renal origin: Secondary | ICD-10-CM | POA: Diagnosis not present

## 2014-05-02 DIAGNOSIS — D509 Iron deficiency anemia, unspecified: Secondary | ICD-10-CM | POA: Diagnosis not present

## 2014-05-04 DIAGNOSIS — N186 End stage renal disease: Secondary | ICD-10-CM | POA: Diagnosis not present

## 2014-05-04 DIAGNOSIS — Z23 Encounter for immunization: Secondary | ICD-10-CM | POA: Diagnosis not present

## 2014-05-04 DIAGNOSIS — N2581 Secondary hyperparathyroidism of renal origin: Secondary | ICD-10-CM | POA: Diagnosis not present

## 2014-05-04 DIAGNOSIS — Z992 Dependence on renal dialysis: Secondary | ICD-10-CM | POA: Diagnosis not present

## 2014-05-04 DIAGNOSIS — D509 Iron deficiency anemia, unspecified: Secondary | ICD-10-CM | POA: Diagnosis not present

## 2014-05-07 DIAGNOSIS — Z992 Dependence on renal dialysis: Secondary | ICD-10-CM | POA: Diagnosis not present

## 2014-05-07 DIAGNOSIS — N186 End stage renal disease: Secondary | ICD-10-CM | POA: Diagnosis not present

## 2014-05-07 DIAGNOSIS — D509 Iron deficiency anemia, unspecified: Secondary | ICD-10-CM | POA: Diagnosis not present

## 2014-05-07 DIAGNOSIS — N2581 Secondary hyperparathyroidism of renal origin: Secondary | ICD-10-CM | POA: Diagnosis not present

## 2014-05-07 DIAGNOSIS — Z23 Encounter for immunization: Secondary | ICD-10-CM | POA: Diagnosis not present

## 2014-05-09 DIAGNOSIS — D509 Iron deficiency anemia, unspecified: Secondary | ICD-10-CM | POA: Diagnosis not present

## 2014-05-09 DIAGNOSIS — Z23 Encounter for immunization: Secondary | ICD-10-CM | POA: Diagnosis not present

## 2014-05-09 DIAGNOSIS — Z992 Dependence on renal dialysis: Secondary | ICD-10-CM | POA: Diagnosis not present

## 2014-05-09 DIAGNOSIS — N186 End stage renal disease: Secondary | ICD-10-CM | POA: Diagnosis not present

## 2014-05-09 DIAGNOSIS — N2581 Secondary hyperparathyroidism of renal origin: Secondary | ICD-10-CM | POA: Diagnosis not present

## 2014-05-11 DIAGNOSIS — Z992 Dependence on renal dialysis: Secondary | ICD-10-CM | POA: Diagnosis not present

## 2014-05-11 DIAGNOSIS — Z23 Encounter for immunization: Secondary | ICD-10-CM | POA: Diagnosis not present

## 2014-05-11 DIAGNOSIS — D509 Iron deficiency anemia, unspecified: Secondary | ICD-10-CM | POA: Diagnosis not present

## 2014-05-11 DIAGNOSIS — N2581 Secondary hyperparathyroidism of renal origin: Secondary | ICD-10-CM | POA: Diagnosis not present

## 2014-05-11 DIAGNOSIS — N186 End stage renal disease: Secondary | ICD-10-CM | POA: Diagnosis not present

## 2014-05-14 DIAGNOSIS — N186 End stage renal disease: Secondary | ICD-10-CM | POA: Diagnosis not present

## 2014-05-14 DIAGNOSIS — N2581 Secondary hyperparathyroidism of renal origin: Secondary | ICD-10-CM | POA: Diagnosis not present

## 2014-05-14 DIAGNOSIS — D509 Iron deficiency anemia, unspecified: Secondary | ICD-10-CM | POA: Diagnosis not present

## 2014-05-14 DIAGNOSIS — Z992 Dependence on renal dialysis: Secondary | ICD-10-CM | POA: Diagnosis not present

## 2014-05-14 DIAGNOSIS — Z23 Encounter for immunization: Secondary | ICD-10-CM | POA: Diagnosis not present

## 2014-05-16 DIAGNOSIS — D509 Iron deficiency anemia, unspecified: Secondary | ICD-10-CM | POA: Diagnosis not present

## 2014-05-16 DIAGNOSIS — N186 End stage renal disease: Secondary | ICD-10-CM | POA: Diagnosis not present

## 2014-05-16 DIAGNOSIS — N2581 Secondary hyperparathyroidism of renal origin: Secondary | ICD-10-CM | POA: Diagnosis not present

## 2014-05-16 DIAGNOSIS — Z23 Encounter for immunization: Secondary | ICD-10-CM | POA: Diagnosis not present

## 2014-05-16 DIAGNOSIS — Z992 Dependence on renal dialysis: Secondary | ICD-10-CM | POA: Diagnosis not present

## 2014-05-18 DIAGNOSIS — Z992 Dependence on renal dialysis: Secondary | ICD-10-CM | POA: Diagnosis not present

## 2014-05-18 DIAGNOSIS — D509 Iron deficiency anemia, unspecified: Secondary | ICD-10-CM | POA: Diagnosis not present

## 2014-05-18 DIAGNOSIS — N2581 Secondary hyperparathyroidism of renal origin: Secondary | ICD-10-CM | POA: Diagnosis not present

## 2014-05-18 DIAGNOSIS — Z23 Encounter for immunization: Secondary | ICD-10-CM | POA: Diagnosis not present

## 2014-05-18 DIAGNOSIS — N186 End stage renal disease: Secondary | ICD-10-CM | POA: Diagnosis not present

## 2014-05-21 DIAGNOSIS — N186 End stage renal disease: Secondary | ICD-10-CM | POA: Diagnosis not present

## 2014-05-21 DIAGNOSIS — Z992 Dependence on renal dialysis: Secondary | ICD-10-CM | POA: Diagnosis not present

## 2014-05-21 DIAGNOSIS — Z23 Encounter for immunization: Secondary | ICD-10-CM | POA: Diagnosis not present

## 2014-05-21 DIAGNOSIS — D509 Iron deficiency anemia, unspecified: Secondary | ICD-10-CM | POA: Diagnosis not present

## 2014-05-21 DIAGNOSIS — N2581 Secondary hyperparathyroidism of renal origin: Secondary | ICD-10-CM | POA: Diagnosis not present

## 2014-05-23 DIAGNOSIS — D509 Iron deficiency anemia, unspecified: Secondary | ICD-10-CM | POA: Diagnosis not present

## 2014-05-23 DIAGNOSIS — Z992 Dependence on renal dialysis: Secondary | ICD-10-CM | POA: Diagnosis not present

## 2014-05-23 DIAGNOSIS — Z23 Encounter for immunization: Secondary | ICD-10-CM | POA: Diagnosis not present

## 2014-05-23 DIAGNOSIS — N2581 Secondary hyperparathyroidism of renal origin: Secondary | ICD-10-CM | POA: Diagnosis not present

## 2014-05-23 DIAGNOSIS — N186 End stage renal disease: Secondary | ICD-10-CM | POA: Diagnosis not present

## 2014-05-25 DIAGNOSIS — D509 Iron deficiency anemia, unspecified: Secondary | ICD-10-CM | POA: Diagnosis not present

## 2014-05-25 DIAGNOSIS — N2581 Secondary hyperparathyroidism of renal origin: Secondary | ICD-10-CM | POA: Diagnosis not present

## 2014-05-25 DIAGNOSIS — Z992 Dependence on renal dialysis: Secondary | ICD-10-CM | POA: Diagnosis not present

## 2014-05-25 DIAGNOSIS — N186 End stage renal disease: Secondary | ICD-10-CM | POA: Diagnosis not present

## 2014-05-25 DIAGNOSIS — Z23 Encounter for immunization: Secondary | ICD-10-CM | POA: Diagnosis not present

## 2014-05-28 DIAGNOSIS — Z992 Dependence on renal dialysis: Secondary | ICD-10-CM | POA: Diagnosis not present

## 2014-05-28 DIAGNOSIS — Z23 Encounter for immunization: Secondary | ICD-10-CM | POA: Diagnosis not present

## 2014-05-28 DIAGNOSIS — N2581 Secondary hyperparathyroidism of renal origin: Secondary | ICD-10-CM | POA: Diagnosis not present

## 2014-05-28 DIAGNOSIS — D509 Iron deficiency anemia, unspecified: Secondary | ICD-10-CM | POA: Diagnosis not present

## 2014-05-28 DIAGNOSIS — N186 End stage renal disease: Secondary | ICD-10-CM | POA: Diagnosis not present

## 2014-05-29 DIAGNOSIS — E10329 Type 1 diabetes mellitus with mild nonproliferative diabetic retinopathy without macular edema: Secondary | ICD-10-CM | POA: Diagnosis not present

## 2014-05-29 DIAGNOSIS — H25813 Combined forms of age-related cataract, bilateral: Secondary | ICD-10-CM | POA: Diagnosis not present

## 2014-05-29 DIAGNOSIS — H52223 Regular astigmatism, bilateral: Secondary | ICD-10-CM | POA: Diagnosis not present

## 2014-05-29 DIAGNOSIS — H5203 Hypermetropia, bilateral: Secondary | ICD-10-CM | POA: Diagnosis not present

## 2014-05-30 DIAGNOSIS — Z23 Encounter for immunization: Secondary | ICD-10-CM | POA: Diagnosis not present

## 2014-05-30 DIAGNOSIS — N186 End stage renal disease: Secondary | ICD-10-CM | POA: Diagnosis not present

## 2014-05-30 DIAGNOSIS — D509 Iron deficiency anemia, unspecified: Secondary | ICD-10-CM | POA: Diagnosis not present

## 2014-05-30 DIAGNOSIS — Z992 Dependence on renal dialysis: Secondary | ICD-10-CM | POA: Diagnosis not present

## 2014-05-30 DIAGNOSIS — N2581 Secondary hyperparathyroidism of renal origin: Secondary | ICD-10-CM | POA: Diagnosis not present

## 2014-06-01 DIAGNOSIS — N186 End stage renal disease: Secondary | ICD-10-CM | POA: Diagnosis not present

## 2014-06-01 DIAGNOSIS — Z23 Encounter for immunization: Secondary | ICD-10-CM | POA: Diagnosis not present

## 2014-06-01 DIAGNOSIS — Z992 Dependence on renal dialysis: Secondary | ICD-10-CM | POA: Diagnosis not present

## 2014-06-01 DIAGNOSIS — D509 Iron deficiency anemia, unspecified: Secondary | ICD-10-CM | POA: Diagnosis not present

## 2014-06-01 DIAGNOSIS — N2581 Secondary hyperparathyroidism of renal origin: Secondary | ICD-10-CM | POA: Diagnosis not present

## 2014-06-02 DIAGNOSIS — N186 End stage renal disease: Secondary | ICD-10-CM | POA: Diagnosis not present

## 2014-06-02 DIAGNOSIS — Z992 Dependence on renal dialysis: Secondary | ICD-10-CM | POA: Diagnosis not present

## 2014-06-04 DIAGNOSIS — Z992 Dependence on renal dialysis: Secondary | ICD-10-CM | POA: Diagnosis not present

## 2014-06-04 DIAGNOSIS — D509 Iron deficiency anemia, unspecified: Secondary | ICD-10-CM | POA: Diagnosis not present

## 2014-06-04 DIAGNOSIS — N2581 Secondary hyperparathyroidism of renal origin: Secondary | ICD-10-CM | POA: Diagnosis not present

## 2014-06-04 DIAGNOSIS — N186 End stage renal disease: Secondary | ICD-10-CM | POA: Diagnosis not present

## 2014-06-25 DIAGNOSIS — N186 End stage renal disease: Secondary | ICD-10-CM | POA: Diagnosis not present

## 2014-06-25 DIAGNOSIS — Z992 Dependence on renal dialysis: Secondary | ICD-10-CM | POA: Diagnosis not present

## 2014-06-26 ENCOUNTER — Ambulatory Visit (INDEPENDENT_AMBULATORY_CARE_PROVIDER_SITE_OTHER): Payer: Medicare Other | Admitting: Cardiology

## 2014-06-26 ENCOUNTER — Encounter: Payer: Self-pay | Admitting: Cardiology

## 2014-06-26 VITALS — BP 164/86 | HR 70 | Ht 74.0 in | Wt 295.0 lb

## 2014-06-26 DIAGNOSIS — I10 Essential (primary) hypertension: Secondary | ICD-10-CM | POA: Diagnosis not present

## 2014-06-26 DIAGNOSIS — I429 Cardiomyopathy, unspecified: Secondary | ICD-10-CM | POA: Diagnosis not present

## 2014-06-26 DIAGNOSIS — I428 Other cardiomyopathies: Secondary | ICD-10-CM

## 2014-06-26 NOTE — Patient Instructions (Signed)

## 2014-06-26 NOTE — Progress Notes (Signed)
Reason for visit: Cardiomyopathy  Clinical Summary Mr. Hitchins is a 63 y.o.male last seen in May. He presents for a routine visit, reports stable NYHA class II dyspnea, no chest pain. He indicates compliance with his cardiac medications including Coreg and Altace. No orthopnea or PND. Weight is up some from the last visit, but he has been compliant with his hemodialysis sessions.  Echocardiogram in April 2014 revealed moderate LVH with LVEF 0000000, grade 1 diastolic dysfunction, trivial mitral regurgitation, mild left atrial enlargement. Lexiscan Cardiolite in April 2014 showed no diagnostic ST segment changes, overall low risk perfusion study with small, partially reversible lateral apical defect possibly reflecting variable soft tissue attenuation, less likely a small region of ischemia. LVEF was 53% by that study.   Allergies  Allergen Reactions  . Bee Venom Anaphylaxis  . Penicillins Swelling    Swelling of throat and tongue    Current Outpatient Prescriptions  Medication Sig Dispense Refill  . acetaminophen (TYLENOL) 500 MG tablet Take 1,000 mg by mouth every 6 (six) hours as needed for mild pain.    [name] aspirin 325 MG tablet Take 325 mg by mouth daily.      . B Complex-C-Folic Acid (DIALYVITE TABLET) TABS Take 1 tablet by mouth every other day.     . carvedilol (COREG) 12.5 MG tablet Take 12.5 mg by mouth every morning.     [name] EPIPEN 2-PAK 0.3 MG/0.3ML SOAJ injection Inject 0.3 mg into the muscle once as needed (for bee).     [name] HYDROcodone-acetaminophen (NORCO/VICODIN) 5-325 MG per tablet Take 1 tablet by mouth every 4 (four) hours as needed for moderate pain.     [name] insulin glargine (LANTUS) 100 UNIT/ML injection Inject 30 Units into the skin at bedtime.     [name] omeprazole (PRILOSEC) 40 MG capsule Take 40 mg by mouth daily.      [name] oxyCODONE (ROXICODONE) 5 MG immediate release tablet Take 1 tablet (5 mg total) by mouth every 4 (four) hours as needed for severe pain. 30 tablet 0  . ramipril  (ALTACE) 10 MG capsule Take 10 mg by mouth every evening.     [name] RENVELA 800 MG tablet Take 800 mg by mouth 3 (three) times daily with meals.     . SENSIPAR 30 MG tablet Take 30 mg by mouth every evening. At dinnertime     No current facility-administered medications for this visit.    Past Medical History  Diagnosis Date  . Nonischemic cardiomyopathy     Hypertension and regular alcohol use, LVEF 40-45%  . End stage renal disease     Hemodialysis on Monday, Wednesday, Friday  . Chronic systolic heart failure   . Anemia in chronic kidney disease(285.21)   . Type 2 diabetes mellitus   . Essential hypertension, benign   . Glaucoma   . GERD (gastroesophageal reflux disease)   . Arthritis     Social History Mr. Koe reports that he has never smoked. He has never used smokeless tobacco. Mr. Zeisloft reports that he drinks about 1.8 oz of alcohol per week.  Review of Systems Complete review of systems negative except as otherwise outlined in the clinical summary and also the following. No palpitations or syncope.  Physical Examination Filed Vitals:   06/26/14 1140  BP: 164/86  Pulse: 70   Filed Weights   06/26/14 1140  Weight: 295 lb (133.811 kg)    Obese male in no acute distress.  HEENT: Conjunctiva and lids normal, oropharynx with moist  mucosa, poor dentition.  Neck: Supple, no elevated JVP, possible left carotid bruit (prior documentation of mild nonobstructive bilateral ICA disease).  Lungs: Decreased but clear overall, nonlabored.  Cardiac: Indistinct PMI, regular rate and rhythm, no S3, soft systolic murmur.  Abdomen: Nontender, bowel sounds present, no hepatomegaly.  Musculoskeletal: No kyphosis.  Extremities: No distal pitting edema. AV fistula noted in the left forearm without thrill, right arm fistula active.    Problem List and Plan   Nonischemic cardiomyopathy Normalization of LVEF on medical therapy with stable symptoms. No changes were made  today.  Essential hypertension, benign Blood pressure is up today, he reports compliance with his medications and hemodialysis. Keep follow-up with Dr. Hilma Favors.    Satira Sark, M.D., F.A.C.C.

## 2014-06-26 NOTE — Assessment & Plan Note (Signed)
Normalization of LVEF on medical therapy with stable symptoms. No changes were made today.

## 2014-06-26 NOTE — Assessment & Plan Note (Signed)
Blood pressure is up today, he reports compliance with his medications and hemodialysis. Keep follow-up with Dr. Hilma Favors.

## 2014-07-02 DIAGNOSIS — Z992 Dependence on renal dialysis: Secondary | ICD-10-CM | POA: Diagnosis not present

## 2014-07-02 DIAGNOSIS — N186 End stage renal disease: Secondary | ICD-10-CM | POA: Diagnosis not present

## 2014-07-04 DIAGNOSIS — Z992 Dependence on renal dialysis: Secondary | ICD-10-CM | POA: Diagnosis not present

## 2014-07-04 DIAGNOSIS — D509 Iron deficiency anemia, unspecified: Secondary | ICD-10-CM | POA: Diagnosis not present

## 2014-07-04 DIAGNOSIS — N186 End stage renal disease: Secondary | ICD-10-CM | POA: Diagnosis not present

## 2014-07-04 DIAGNOSIS — N2581 Secondary hyperparathyroidism of renal origin: Secondary | ICD-10-CM | POA: Diagnosis not present

## 2014-07-09 DIAGNOSIS — H25812 Combined forms of age-related cataract, left eye: Secondary | ICD-10-CM | POA: Diagnosis not present

## 2014-07-09 DIAGNOSIS — E0869 Diabetes mellitus due to underlying condition with other specified complication: Secondary | ICD-10-CM | POA: Diagnosis not present

## 2014-07-09 DIAGNOSIS — E11329 Type 2 diabetes mellitus with mild nonproliferative diabetic retinopathy without macular edema: Secondary | ICD-10-CM | POA: Diagnosis not present

## 2014-07-09 DIAGNOSIS — H25813 Combined forms of age-related cataract, bilateral: Secondary | ICD-10-CM | POA: Diagnosis not present

## 2014-07-12 ENCOUNTER — Encounter (HOSPITAL_COMMUNITY): Payer: Self-pay | Admitting: Surgery

## 2014-07-12 NOTE — Patient Instructions (Signed)
Your procedure is scheduled on:  07/16/14  Report to [facility] at 12:30 PM.  Call this number if you have problems the morning of surgery: [phone]   Remember:   Do not eat food or drink liquids after midnight.   Take these medicines the morning of surgery with A SIP OF WATER: Carvedilol, Omeprazole and Ramipril. You may take either your Oxycodone or Hydrocodone if needed. Only take half of your dose of Lantus the night before your surgery.   Do not wear jewelry, make-up or nail polish.  Do not wear lotions, powders, or perfumes. You may wear deodorant.  Do not bring valuables to the hospital.  [facility] is not responsible for any belongings or valuables.               Contacts, dentures or bridgework may not be worn into surgery.               Patients discharged the day of surgery will not be allowed to drive home.   Special Instructions: Start using your eye drops prior to surgery as directed by your eye doctor.   Please read over the following fact sheets that you were given: Anesthesia Post-op Instructions and Care and Recovery After Surgery     Cataract Surgery  A cataract is a clouding of the lens of the eye. When a lens becomes cloudy, vision is reduced based on the degree and nature of the clouding. Surgery may be needed to improve vision. Surgery removes the cloudy lens and usually replaces it with a substitute lens (intraocular lens, IOL). LET YOUR EYE DOCTOR KNOW ABOUT:  Allergies to food or medicine.  Medicines taken including herbs, eyedrops, over-the-counter medicines, and creams.  Use of steroids (by mouth or creams).  Previous problems with anesthetics or numbing medicine.  History of bleeding problems or blood clots.  Previous surgery.  Other health problems, including diabetes and kidney problems.  Possibility of pregnancy, if this applies. RISKS AND COMPLICATIONS  Infection.  Inflammation of the eyeball (endophthalmitis) that can spread to both  eyes (sympathetic ophthalmia).  Poor wound healing.  If an IOL is inserted, it can later fall out of proper position. This is very uncommon.  Clouding of the part of your eye that holds an IOL in place. This is called an "after-cataract." These are uncommon, but easily treated. BEFORE THE PROCEDURE  Do not eat or drink anything except small amounts of water for 8 to 12 before your surgery, or as directed by your caregiver.  Unless you are told otherwise, continue any eyedrops you have been prescribed.  Talk to your primary caregiver about all other medicines that you take (both prescription and non-prescription). In some cases, you may need to stop or change medicines near the time of your surgery. This is most important if you are taking blood-thinning medicine.Do not stop medicines unless you are told to do so.  Arrange for someone to drive you to and from the procedure.  Do not put contact lenses in either eye on the day of your surgery. PROCEDURE There is more than one method for safely removing a cataract. Your doctor can explain the differences and help determine which is best for you. Phacoemulsification surgery is the most common form of cataract surgery.  An injection is given behind the eye or eyedrops are given to make this a painless procedure.  A small cut (incision) is made on the edge of the clear, dome-shaped surface that covers the front  of the eye (cornea).  A tiny probe is painlessly inserted into the eye. This device gives off ultrasound waves that soften and break up the cloudy center of the lens. This makes it easier for the cloudy lens to be removed by suction.  An IOL may be implanted.  The normal lens of the eye is covered by a clear capsule. Part of that capsule is intentionally left in the eye to support the IOL.  Your surgeon may or may not use stitches to close the incision. There are other forms of cataract surgery that require a larger incision and  stiches to close the eye. This approach is taken in cases where the doctor feels that the cataract cannot be easily removed using phacoemulsification. AFTER THE PROCEDURE  When an IOL is implanted, it does not need care. It becomes a permanent part of your eye and cannot be seen or felt.  Your doctor will schedule follow-up exams to check on your progress.  Review your other medicines with your doctor to see which can be resumed after surgery.  Use eyedrops or take medicine as prescribed by your doctor. Document Released: 07/09/2011 Document Revised: 10/12/2011 Document Reviewed: 07/09/2011 [facility] Patient Information 2013 [place].    PATIENT INSTRUCTIONS POST-ANESTHESIA  IMMEDIATELY FOLLOWING SURGERY:  Do not drive or operate machinery for the first twenty four hours after surgery.  Do not make any important decisions for twenty four hours after surgery or while taking narcotic pain medications or sedatives.  If you develop intractable nausea and vomiting or a severe headache please notify your doctor immediately.  FOLLOW-UP:  Please make an appointment with your surgeon as instructed. You do not need to follow up with anesthesia unless specifically instructed to do so.  WOUND CARE INSTRUCTIONS (if applicable):  Keep a dry clean dressing on the anesthesia/puncture wound site if there is drainage.  Once the wound has quit draining you may leave it open to air.  Generally you should leave the bandage intact for twenty four hours unless there is drainage.  If the epidural site drains for more than 36-48 hours please call the anesthesia department.  QUESTIONS?:  Please feel free to call your physician or the hospital operator if you have any questions, and they will be happy to assist you.

## 2014-07-13 ENCOUNTER — Encounter (HOSPITAL_COMMUNITY)
Admission: RE | Admit: 2014-07-13 | Discharge: 2014-07-13 | Disposition: A | Discharge status: Home / Self Care | Payer: Medicare Other | Source: Ambulatory Visit | Attending: Ophthalmology | Admitting: Ophthalmology

## 2014-07-13 ENCOUNTER — Encounter (HOSPITAL_COMMUNITY): Payer: Self-pay

## 2014-07-13 DIAGNOSIS — H25812 Combined forms of age-related cataract, left eye: Secondary | ICD-10-CM | POA: Diagnosis not present

## 2014-07-13 MED ORDER — LIDOCAINE HCL 3.5 % OP GEL
OPHTHALMIC | Status: AC
  Filled 2014-07-13: qty 1

## 2014-07-13 MED ORDER — PHENYLEPHRINE HCL 2.5 % OP SOLN
OPHTHALMIC | Status: AC
  Filled 2014-07-13: qty 15

## 2014-07-13 MED ORDER — TETRACAINE HCL 0.5 % OP SOLN
OPHTHALMIC | Status: AC
  Filled 2014-07-13: qty 2

## 2014-07-13 MED ORDER — LIDOCAINE HCL (PF) 1 % IJ SOLN
INTRAMUSCULAR | Status: AC
  Filled 2014-07-13: qty 2

## 2014-07-13 MED ORDER — CYCLOPENTOLATE-PHENYLEPHRINE OP SOLN OPTIME - NO CHARGE
OPHTHALMIC | Status: AC
  Filled 2014-07-13: qty 2

## 2014-07-13 MED ORDER — NEOMYCIN-POLYMYXIN-DEXAMETH 3.5-10000-0.1 OP SUSP
OPHTHALMIC | Status: AC
  Filled 2014-07-13: qty 5

## 2014-07-13 NOTE — Progress Notes (Signed)
   07/13/14 1322  OBSTRUCTIVE SLEEP APNEA  Have you ever been diagnosed with sleep apnea through a sleep study? No  Do you snore loudly (loud enough to be heard through closed doors)?  0  Do you often feel tired, fatigued, or sleepy during the daytime? 0  Has anyone observed you stop breathing during your sleep? 0  Do you have, or are you being treated for high blood pressure? 1  BMI more than 35 kg/m2? 1  Age over 64 years old? 1  Neck circumference greater than 40 cm/16 inches? 1  Gender: 1  Obstructive Sleep Apnea Score 5  Score 4 or greater  Results sent to PCP

## 2014-07-13 NOTE — Pre-Procedure Instructions (Signed)
Patient given information to sign up for my chart at home. 

## 2014-07-16 ENCOUNTER — Encounter (HOSPITAL_COMMUNITY)
Admission: RE | Disposition: A | Discharge status: Home / Self Care | Payer: Self-pay | Source: Ambulatory Visit | Attending: Ophthalmology

## 2014-07-16 ENCOUNTER — Ambulatory Visit (HOSPITAL_COMMUNITY)
Admission: RE | Admit: 2014-07-16 | Discharge: 2014-07-16 | Disposition: A | Discharge status: Home / Self Care | Payer: Medicare Other | Source: Ambulatory Visit | Attending: Ophthalmology | Admitting: Ophthalmology

## 2014-07-16 ENCOUNTER — Encounter (HOSPITAL_COMMUNITY): Payer: Self-pay | Admitting: *Deleted

## 2014-07-16 ENCOUNTER — Ambulatory Visit (HOSPITAL_COMMUNITY): Payer: Medicare Other | Admitting: Anesthesiology

## 2014-07-16 DIAGNOSIS — H25811 Combined forms of age-related cataract, right eye: Secondary | ICD-10-CM | POA: Diagnosis not present

## 2014-07-16 DIAGNOSIS — H25812 Combined forms of age-related cataract, left eye: Secondary | ICD-10-CM | POA: Insufficient documentation

## 2014-07-16 DIAGNOSIS — H269 Unspecified cataract: Secondary | ICD-10-CM | POA: Diagnosis not present

## 2014-07-16 HISTORY — PX: CATARACT EXTRACTION W/PHACO: SHX586

## 2014-07-16 LAB — POCT I-STAT, CHEM 8
BUN: 48 mg/dL — ABNORMAL HIGH (ref 6–23)
CALCIUM ION: 1.18 mmol/L (ref 1.13–1.30)
Chloride: 103 mEq/L (ref 96–112)
Creatinine, Ser: 13.4 mg/dL — ABNORMAL HIGH (ref 0.50–1.35)
GLUCOSE: 93 mg/dL (ref 70–99)
HCT: 43 % (ref 39.0–52.0)
HEMOGLOBIN: 14.6 g/dL (ref 13.0–17.0)
POTASSIUM: 4.1 meq/L (ref 3.7–5.3)
Sodium: 141 mEq/L (ref 137–147)
TCO2: 24 mmol/L (ref 0–100)

## 2014-07-16 SURGERY — PHACOEMULSIFICATION, CATARACT, WITH IOL INSERTION
Anesthesia: Monitor Anesthesia Care | Site: Eye | Laterality: Left

## 2014-07-16 MED ORDER — NEOMYCIN-POLYMYXIN-DEXAMETH 3.5-10000-0.1 OP SUSP
OPHTHALMIC | Status: DC | PRN
  Administered 2014-07-16: 1 [drp] via OPHTHALMIC

## 2014-07-16 MED ORDER — PROVISC 10 MG/ML IO SOLN
INTRAOCULAR | Status: DC | PRN
  Administered 2014-07-16: 0.85 mL via INTRAOCULAR

## 2014-07-16 MED ORDER — POVIDONE-IODINE 5 % OP SOLN
OPHTHALMIC | Status: DC | PRN
  Administered 2014-07-16: 1 via OPHTHALMIC

## 2014-07-16 MED ORDER — TETRACAINE HCL 0.5 % OP SOLN
1.0000 [drp] | OPHTHALMIC | Status: AC
  Administered 2014-07-16 (×3): 1 [drp] via OPHTHALMIC

## 2014-07-16 MED ORDER — EPINEPHRINE HCL 1 MG/ML IJ SOLN
INTRAMUSCULAR | Status: AC
  Filled 2014-07-16: qty 1

## 2014-07-16 MED ORDER — LIDOCAINE 3.5 % OP GEL OPTIME - NO CHARGE
OPHTHALMIC | Status: DC | PRN
  Administered 2014-07-16: 1 [drp] via OPHTHALMIC

## 2014-07-16 MED ORDER — SODIUM CHLORIDE 0.9 % IV SOLN
INTRAVENOUS | Status: DC
  Administered 2014-07-16: 13:00:00 via INTRAVENOUS

## 2014-07-16 MED ORDER — PHENYLEPHRINE HCL 2.5 % OP SOLN
1.0000 [drp] | OPHTHALMIC | Status: AC
  Administered 2014-07-16 (×3): 1 [drp] via OPHTHALMIC

## 2014-07-16 MED ORDER — LIDOCAINE HCL (PF) 1 % IJ SOLN
INTRAMUSCULAR | Status: DC | PRN
  Administered 2014-07-16: .5 mL

## 2014-07-16 MED ORDER — MIDAZOLAM HCL 2 MG/2ML IJ SOLN
1.0000 mg | INTRAMUSCULAR | Status: DC | PRN
  Administered 2014-07-16: 2 mg via INTRAVENOUS
  Filled 2014-07-16: qty 2

## 2014-07-16 MED ORDER — CYCLOPENTOLATE-PHENYLEPHRINE 0.2-1 % OP SOLN
1.0000 [drp] | OPHTHALMIC | Status: AC
  Administered 2014-07-16 (×3): 1 [drp] via OPHTHALMIC

## 2014-07-16 MED ORDER — BSS IO SOLN
INTRAOCULAR | Status: DC | PRN
  Administered 2014-07-16: 30 mL via INTRAOCULAR

## 2014-07-16 MED ORDER — LIDOCAINE HCL 3.5 % OP GEL
1.0000 "application " | Freq: Once | OPHTHALMIC | Status: AC
  Administered 2014-07-16: 1 via OPHTHALMIC

## 2014-07-16 MED ORDER — EPINEPHRINE HCL 1 MG/ML IJ SOLN
INTRAOCULAR | Status: DC | PRN
  Administered 2014-07-16: 14:00:00

## 2014-07-16 MED ORDER — FENTANYL CITRATE 0.05 MG/ML IJ SOLN
25.0000 ug | INTRAMUSCULAR | Status: AC
  Administered 2014-07-16 (×2): 25 ug via INTRAVENOUS
  Filled 2014-07-16: qty 2

## 2014-07-16 SURGICAL SUPPLY — 34 items
CAPSULAR TENSION RING-AMO (OPHTHALMIC RELATED) IMPLANT
CLOTH BEACON ORANGE TIMEOUT ST (SAFETY) ×2 IMPLANT
EYE SHIELD UNIVERSAL CLEAR (GAUZE/BANDAGES/DRESSINGS) ×2 IMPLANT
GLOVE BIO SURGEON STRL SZ 6.5 (GLOVE) IMPLANT
GLOVE BIO SURGEONS STRL SZ 6.5 (GLOVE)
GLOVE BIOGEL PI IND STRL 6.5 (GLOVE) IMPLANT
GLOVE BIOGEL PI IND STRL 7.0 (GLOVE) IMPLANT
GLOVE BIOGEL PI IND STRL 7.5 (GLOVE) IMPLANT
GLOVE BIOGEL PI INDICATOR 6.5 (GLOVE) ×4
GLOVE BIOGEL PI INDICATOR 7.0 (GLOVE)
GLOVE BIOGEL PI INDICATOR 7.5 (GLOVE)
GLOVE ECLIPSE 6.5 STRL STRAW (GLOVE) IMPLANT
GLOVE ECLIPSE 7.0 STRL STRAW (GLOVE) IMPLANT
GLOVE ECLIPSE 7.5 STRL STRAW (GLOVE) IMPLANT
GLOVE EXAM NITRILE LRG STRL (GLOVE) IMPLANT
GLOVE EXAM NITRILE MD LF STRL (GLOVE) IMPLANT
GLOVE SKINSENSE NS SZ6.5 (GLOVE)
GLOVE SKINSENSE NS SZ7.0 (GLOVE)
GLOVE SKINSENSE STRL SZ6.5 (GLOVE) IMPLANT
GLOVE SKINSENSE STRL SZ7.0 (GLOVE) IMPLANT
KIT VITRECTOMY (OPHTHALMIC RELATED) IMPLANT
PAD ARMBOARD 7.5X6 YLW CONV (MISCELLANEOUS) ×2 IMPLANT
PROC W NO LENS (INTRAOCULAR LENS)
PROC W SPEC LENS (INTRAOCULAR LENS)
PROCESS W NO LENS (INTRAOCULAR LENS) IMPLANT
PROCESS W SPEC LENS (INTRAOCULAR LENS) IMPLANT
RETRACTOR IRIS SIGHTPATH (OPHTHALMIC RELATED) IMPLANT
RING MALYGIN (MISCELLANEOUS) IMPLANT
SIGHTPATH CAT PROC W REG LENS (Ophthalmic Related) ×3 IMPLANT
SYRINGE LUER LOK 1CC (MISCELLANEOUS) ×2 IMPLANT
TAPE SURG TRANSPORE 1 IN (GAUZE/BANDAGES/DRESSINGS) IMPLANT
TAPE SURGICAL TRANSPORE 1 IN (GAUZE/BANDAGES/DRESSINGS) ×2
VISCOELASTIC ADDITIONAL (OPHTHALMIC RELATED) IMPLANT
WATER STERILE IRR 250ML POUR (IV SOLUTION) ×2 IMPLANT

## 2014-07-16 NOTE — Addendum Note (Signed)
Addendum  created 07/16/14 1415 by Charmaine Downs, CRNA   Modules edited: Charges VN

## 2014-07-16 NOTE — Transfer of Care (Signed)
Immediate Anesthesia Transfer of Care Note  Patient: Jacob [name]  Procedure(s) Performed: Procedure(s) with comments: CATARACT EXTRACTION PHACO AND INTRAOCULAR LENS PLACEMENT (IOC) (Left) - CDE:8.86  Patient Location: Short Stay  Anesthesia Type:MAC  Level of Consciousness: awake, alert , oriented and patient cooperative  Airway & Oxygen Therapy: Patient Spontanous Breathing  Post-op Assessment: Report given to PACU RN, Post -op Vital signs reviewed and stable and Patient moving all extremities  Post vital signs: Reviewed and stable  Complications: No apparent anesthesia complications

## 2014-07-16 NOTE — H&P (Signed)
I have reviewed the H&P, the patient was re-examined, and I have identified no interval changes in medical condition and plan of care since the history and physical of record  

## 2014-07-16 NOTE — Discharge Instructions (Signed)

## 2014-07-16 NOTE — Anesthesia Preprocedure Evaluation (Signed)
Anesthesia Evaluation  Patient identified by MRN, date of birth, ID band Patient awake    Reviewed: Allergy & Precautions, H&P , NPO status , Patient's Chart, lab work & pertinent test results, reviewed documented beta blocker date and time   Airway Mallampati: III  TM Distance: >3 FB     Dental  (+) Partial Upper   Pulmonary shortness of breath and with exertion,  breath sounds clear to auscultation        Cardiovascular hypertension, Pt. on medications and Pt. on home beta blockers +CHF and + DOE Rhythm:Regular Rate:Normal     Neuro/Psych    GI/Hepatic GERD-  Controlled,(+)     substance abuse  alcohol use,   Endo/Other  diabetes, Type 2, Insulin Dependent  Renal/GU ESRFRenal disease     Musculoskeletal  (+) Arthritis -,   Abdominal   Peds  Hematology  (+) anemia ,   Anesthesia Other Findings   Reproductive/Obstetrics                             Anesthesia Physical Anesthesia Plan  ASA: III  Anesthesia Plan: MAC   Post-op Pain Management:    Induction: Intravenous  Airway Management Planned: Nasal Cannula  Additional Equipment:   Intra-op Plan:   Post-operative Plan:   Informed Consent: I have reviewed the patients History and Physical, chart, labs and discussed the procedure including the risks, benefits and alternatives for the proposed anesthesia with the patient or authorized representative who has indicated his/her understanding and acceptance.     Plan Discussed with:   Anesthesia Plan Comments:         Anesthesia Quick Evaluation

## 2014-07-16 NOTE — Op Note (Signed)
Date of Admission: 07/16/2014  Date of Surgery: 07/16/2014   Pre-Op Dx: Cataract Left Eye  Post-Op Dx: Senile Combined Cataract Left  Eye,  Dx Code [id number]  Surgeon: [name], M.D.  Assistants: None  Anesthesia: Topical with MAC  Indications: Painless, progressive loss of vision with compromise of daily activities.  Surgery: Cataract Extraction with Intraocular lens Implant Left Eye  Discription: The patient had dilating drops and viscous lidocaine placed into the Left eye in the pre-op holding area. After transfer to the operating room, a time out was performed. The patient was then prepped and draped. Beginning with a 61 degree blade a paracentesis port was made at the surgeon's 2 o'clock position. The anterior chamber was then filled with 1% non-preserved lidocaine. This was followed by filling the anterior chamber with Provisc.  A 2.45mm keratome blade was used to make a clear corneal incision at the temporal limbus.  A bent cystatome needle was used to create a continuous tear capsulotomy. Hydrodissection was performed with balanced salt solution on a Fine canula. The lens nucleus was then removed using the phacoemulsification handpiece. Residual cortex was removed with the I&A handpiece. The anterior chamber and capsular bag were refilled with Provisc. A posterior chamber intraocular lens was placed into the capsular bag with it's injector. The implant was positioned with the Kuglan hook. The Provisc was then removed from the anterior chamber and capsular bag with the I&A handpiece. Stromal hydration of the main incision and paracentesis port was performed with BSS on a Fine canula. The wounds were tested for leak which was negative. The patient tolerated the procedure well. There were no operative complications. The patient was then transferred to the recovery room in stable condition.  Complications: None  Specimen: None  EBL: None  Prosthetic device: Hoya iSert 250, power 20.0 D,  SN [id number].

## 2014-07-16 NOTE — Anesthesia Postprocedure Evaluation (Signed)
  Anesthesia Post-op Note  Patient: Jacob [name]  Procedure(s) Performed: Procedure(s) with comments: CATARACT EXTRACTION PHACO AND INTRAOCULAR LENS PLACEMENT (IOC) (Left) - CDE:8.86  Patient Location: Short Stay  Anesthesia Type:MAC  Level of Consciousness: awake, alert , oriented and patient cooperative  Airway and Oxygen Therapy: Patient Spontanous Breathing  Post-op Pain: none  Post-op Assessment: Post-op Vital signs reviewed, Patient's Cardiovascular Status Stable, Respiratory Function Stable, Patent Airway and Adequate PO intake  Post-op Vital Signs: Reviewed and stable  Last Vitals:  Filed Vitals:   07/16/14 1345  BP: 161/78  Temp:   Resp: 16    Complications: No apparent anesthesia complications

## 2014-07-18 ENCOUNTER — Encounter (HOSPITAL_COMMUNITY): Payer: Self-pay | Admitting: Ophthalmology

## 2014-07-19 DIAGNOSIS — Z0000 Encounter for general adult medical examination without abnormal findings: Secondary | ICD-10-CM | POA: Diagnosis not present

## 2014-07-19 DIAGNOSIS — Z6838 Body mass index (BMI) 38.0-38.9, adult: Secondary | ICD-10-CM | POA: Diagnosis not present

## 2014-07-19 DIAGNOSIS — E1065 Type 1 diabetes mellitus with hyperglycemia: Secondary | ICD-10-CM | POA: Diagnosis not present

## 2014-07-23 DIAGNOSIS — H25811 Combined forms of age-related cataract, right eye: Secondary | ICD-10-CM | POA: Diagnosis not present

## 2014-07-25 ENCOUNTER — Encounter (HOSPITAL_COMMUNITY): Payer: Self-pay

## 2014-07-25 ENCOUNTER — Encounter (HOSPITAL_COMMUNITY)
Admission: RE | Admit: 2014-07-25 | Discharge: 2014-07-25 | Disposition: A | Discharge status: Home / Self Care | Payer: Medicare Other | Source: Ambulatory Visit | Attending: Ophthalmology | Admitting: Ophthalmology

## 2014-07-30 ENCOUNTER — Ambulatory Visit (HOSPITAL_COMMUNITY): Payer: Medicare Other | Admitting: Anesthesiology

## 2014-07-30 ENCOUNTER — Ambulatory Visit (HOSPITAL_COMMUNITY)
Admission: RE | Admit: 2014-07-30 | Discharge: 2014-07-30 | Disposition: A | Discharge status: Home / Self Care | Payer: Medicare Other | Source: Ambulatory Visit | Attending: Ophthalmology | Admitting: Ophthalmology

## 2014-07-30 ENCOUNTER — Encounter (HOSPITAL_COMMUNITY)
Admission: RE | Disposition: A | Discharge status: Home / Self Care | Payer: Self-pay | Source: Ambulatory Visit | Attending: Ophthalmology

## 2014-07-30 ENCOUNTER — Encounter (HOSPITAL_COMMUNITY): Payer: Self-pay | Admitting: *Deleted

## 2014-07-30 DIAGNOSIS — D649 Anemia, unspecified: Secondary | ICD-10-CM | POA: Insufficient documentation

## 2014-07-30 DIAGNOSIS — H25811 Combined forms of age-related cataract, right eye: Secondary | ICD-10-CM | POA: Insufficient documentation

## 2014-07-30 DIAGNOSIS — H269 Unspecified cataract: Secondary | ICD-10-CM | POA: Diagnosis not present

## 2014-07-30 HISTORY — PX: CATARACT EXTRACTION W/PHACO: SHX586

## 2014-07-30 LAB — GLUCOSE, CAPILLARY
GLUCOSE-CAPILLARY: 67 mg/dL — AB (ref 70–99)
Glucose-Capillary: 137 mg/dL — ABNORMAL HIGH (ref 70–99)

## 2014-07-30 LAB — POCT I-STAT 4, (NA,K, GLUC, HGB,HCT)
Glucose, Bld: 83 mg/dL (ref 70–99)
HEMATOCRIT: 42 % (ref 39.0–52.0)
HEMOGLOBIN: 14.3 g/dL (ref 13.0–17.0)
Potassium: 4.5 mmol/L (ref 3.5–5.1)
SODIUM: 135 mmol/L (ref 135–145)

## 2014-07-30 SURGERY — PHACOEMULSIFICATION, CATARACT, WITH IOL INSERTION
Anesthesia: Monitor Anesthesia Care | Site: Eye | Laterality: Right

## 2014-07-30 MED ORDER — CYCLOPENTOLATE-PHENYLEPHRINE 0.2-1 % OP SOLN
1.0000 [drp] | OPHTHALMIC | Status: AC
  Administered 2014-07-30 (×3): 1 [drp] via OPHTHALMIC

## 2014-07-30 MED ORDER — FENTANYL CITRATE 0.05 MG/ML IJ SOLN
25.0000 ug | INTRAMUSCULAR | Status: AC
  Administered 2014-07-30 (×2): 25 ug via INTRAVENOUS

## 2014-07-30 MED ORDER — PHENYLEPHRINE HCL 2.5 % OP SOLN
1.0000 [drp] | OPHTHALMIC | Status: AC
  Administered 2014-07-30 (×3): 1 [drp] via OPHTHALMIC

## 2014-07-30 MED ORDER — EPINEPHRINE HCL 1 MG/ML IJ SOLN
INTRAOCULAR | Status: DC | PRN
  Administered 2014-07-30: 500 mL

## 2014-07-30 MED ORDER — POVIDONE-IODINE 5 % OP SOLN
OPHTHALMIC | Status: DC | PRN
  Administered 2014-07-30: 1 via OPHTHALMIC

## 2014-07-30 MED ORDER — BSS IO SOLN
INTRAOCULAR | Status: DC | PRN
  Administered 2014-07-30: 15 mL

## 2014-07-30 MED ORDER — PROVISC 10 MG/ML IO SOLN
INTRAOCULAR | Status: DC | PRN
  Administered 2014-07-30: 0.85 mL via INTRAOCULAR

## 2014-07-30 MED ORDER — SODIUM CHLORIDE 0.9 % IV SOLN
Freq: Once | INTRAVENOUS | Status: AC
  Administered 2014-07-30: 09:00:00 via INTRAVENOUS

## 2014-07-30 MED ORDER — MIDAZOLAM HCL 2 MG/2ML IJ SOLN
1.0000 mg | INTRAMUSCULAR | Status: DC | PRN
  Administered 2014-07-30: 2 mg via INTRAVENOUS

## 2014-07-30 MED ORDER — TETRACAINE HCL 0.5 % OP SOLN
1.0000 [drp] | OPHTHALMIC | Status: AC
  Administered 2014-07-30 (×3): 1 [drp] via OPHTHALMIC

## 2014-07-30 MED ORDER — FENTANYL CITRATE 0.05 MG/ML IJ SOLN
INTRAMUSCULAR | Status: AC
  Filled 2014-07-30: qty 2

## 2014-07-30 MED ORDER — NEOMYCIN-POLYMYXIN-DEXAMETH 3.5-10000-0.1 OP SUSP
OPHTHALMIC | Status: DC | PRN
  Administered 2014-07-30: 2 [drp] via OPHTHALMIC

## 2014-07-30 MED ORDER — LABETALOL HCL 5 MG/ML IV SOLN
INTRAVENOUS | Status: AC
  Filled 2014-07-30: qty 4

## 2014-07-30 MED ORDER — LIDOCAINE HCL (PF) 1 % IJ SOLN
INTRAMUSCULAR | Status: DC | PRN
  Administered 2014-07-30: .4 mL

## 2014-07-30 MED ORDER — LIDOCAINE HCL 3.5 % OP GEL
1.0000 "application " | Freq: Once | OPHTHALMIC | Status: AC
  Administered 2014-07-30: 1 via OPHTHALMIC

## 2014-07-30 MED ORDER — LABETALOL HCL 5 MG/ML IV SOLN
10.0000 mg | Freq: Once | INTRAVENOUS | Status: AC
  Administered 2014-07-30: 10 mg via INTRAVENOUS

## 2014-07-30 MED ORDER — MIDAZOLAM HCL 2 MG/2ML IJ SOLN
INTRAMUSCULAR | Status: AC
  Filled 2014-07-30: qty 2

## 2014-07-30 MED ORDER — SODIUM CHLORIDE 0.9 % IV SOLN
INTRAVENOUS | Status: DC | PRN
  Administered 2014-07-30: 09:00:00 via INTRAVENOUS

## 2014-07-30 MED ORDER — LIDOCAINE 3.5 % OP GEL OPTIME - NO CHARGE
OPHTHALMIC | Status: DC | PRN
  Administered 2014-07-30: 1 [drp] via OPHTHALMIC

## 2014-07-30 MED ORDER — EPINEPHRINE HCL 1 MG/ML IJ SOLN
INTRAMUSCULAR | Status: AC
  Filled 2014-07-30: qty 1

## 2014-07-30 SURGICAL SUPPLY — 34 items
CAPSULAR TENSION RING-AMO (OPHTHALMIC RELATED) IMPLANT
CLOTH BEACON ORANGE TIMEOUT ST (SAFETY) ×2 IMPLANT
EYE SHIELD UNIVERSAL CLEAR (GAUZE/BANDAGES/DRESSINGS) ×2 IMPLANT
GLOVE BIO SURGEON STRL SZ 6.5 (GLOVE) IMPLANT
GLOVE BIO SURGEONS STRL SZ 6.5 (GLOVE)
GLOVE BIOGEL PI IND STRL 6.5 (GLOVE) IMPLANT
GLOVE BIOGEL PI IND STRL 7.0 (GLOVE) IMPLANT
GLOVE BIOGEL PI IND STRL 7.5 (GLOVE) IMPLANT
GLOVE BIOGEL PI INDICATOR 6.5 (GLOVE)
GLOVE BIOGEL PI INDICATOR 7.0 (GLOVE) ×4
GLOVE BIOGEL PI INDICATOR 7.5 (GLOVE)
GLOVE ECLIPSE 6.5 STRL STRAW (GLOVE) IMPLANT
GLOVE ECLIPSE 7.0 STRL STRAW (GLOVE) IMPLANT
GLOVE ECLIPSE 7.5 STRL STRAW (GLOVE) IMPLANT
GLOVE EXAM NITRILE LRG STRL (GLOVE) IMPLANT
GLOVE EXAM NITRILE MD LF STRL (GLOVE) IMPLANT
GLOVE SKINSENSE NS SZ6.5 (GLOVE)
GLOVE SKINSENSE NS SZ7.0 (GLOVE)
GLOVE SKINSENSE STRL SZ6.5 (GLOVE) IMPLANT
GLOVE SKINSENSE STRL SZ7.0 (GLOVE) IMPLANT
KIT VITRECTOMY (OPHTHALMIC RELATED) IMPLANT
PAD ARMBOARD 7.5X6 YLW CONV (MISCELLANEOUS) ×2 IMPLANT
PROC W NO LENS (INTRAOCULAR LENS)
PROC W SPEC LENS (INTRAOCULAR LENS)
PROCESS W NO LENS (INTRAOCULAR LENS) IMPLANT
PROCESS W SPEC LENS (INTRAOCULAR LENS) IMPLANT
RETRACTOR IRIS SIGHTPATH (OPHTHALMIC RELATED) IMPLANT
RING MALYGIN (MISCELLANEOUS) IMPLANT
SIGHTPATH CAT PROC W REG LENS (Ophthalmic Related) ×3 IMPLANT
SYRINGE LUER LOK 1CC (MISCELLANEOUS) ×2 IMPLANT
TAPE SURG TRANSPORE 1 IN (GAUZE/BANDAGES/DRESSINGS) IMPLANT
TAPE SURGICAL TRANSPORE 1 IN (GAUZE/BANDAGES/DRESSINGS) ×2
VISCOELASTIC ADDITIONAL (OPHTHALMIC RELATED) IMPLANT
WATER STERILE IRR 250ML POUR (IV SOLUTION) ×2 IMPLANT

## 2014-07-30 NOTE — Op Note (Signed)
Date of Admission: 07/30/2014  Date of Surgery: 07/30/2014   Pre-Op Dx: Cataract Right Eye  Post-Op Dx: Senile Combined Cataract Right  Eye,  Dx Code [id number]  Surgeon: [name], M.D.  Assistants: None  Anesthesia: Topical with MAC  Indications: Painless, progressive loss of vision with compromise of daily activities.  Surgery: Cataract Extraction with Intraocular lens Implant Right Eye  Discription: The patient had dilating drops and viscous lidocaine placed into the Right eye in the pre-op holding area. After transfer to the operating room, a time out was performed. The patient was then prepped and draped. Beginning with a 69 degree blade a paracentesis port was made at the surgeon's 2 o'clock position. The anterior chamber was then filled with 1% non-preserved lidocaine. This was followed by filling the anterior chamber with Provisc.  A 2.56mm keratome blade was used to make a clear corneal incision at the temporal limbus.  A bent cystatome needle was used to create a continuous tear capsulotomy. Hydrodissection was performed with balanced salt solution on a Fine canula. The lens nucleus was then removed using the phacoemulsification handpiece. Residual cortex was removed with the I&A handpiece. The anterior chamber and capsular bag were refilled with Provisc. A posterior chamber intraocular lens was placed into the capsular bag with it's injector. The implant was positioned with the Kuglan hook. The Provisc was then removed from the anterior chamber and capsular bag with the I&A handpiece. Stromal hydration of the main incision and paracentesis port was performed with BSS on a Fine canula. The wounds were tested for leak which was negative. The patient tolerated the procedure well. There were no operative complications. The patient was then transferred to the recovery room in stable condition.  Complications: None  Specimen: None  EBL: None  Prosthetic device: Hoya iSert 250, power 20.0  D, SN [id number].

## 2014-07-30 NOTE — Discharge Instructions (Signed)

## 2014-07-30 NOTE — Anesthesia Procedure Notes (Signed)
Procedure Name: MAC Date/Time: 07/30/2014 9:49 AM Performed by: Vista Deck Pre-anesthesia Checklist: Patient identified, Emergency Drugs available, Suction available, Timeout performed and Patient being monitored Patient Re-evaluated:Patient Re-evaluated prior to inductionOxygen Delivery Method: Nasal Cannula

## 2014-07-30 NOTE — H&P (Signed)
I have reviewed the H&P, the patient was re-examined, and I have identified no interval changes in medical condition and plan of care since the history and physical of record  

## 2014-07-30 NOTE — Anesthesia Preprocedure Evaluation (Signed)
Anesthesia Evaluation  Patient identified by MRN, date of birth, ID band Patient awake    Reviewed: Allergy & Precautions, H&P , NPO status , Patient's Chart, lab work & pertinent test results, reviewed documented beta blocker date and time   Airway Mallampati: III  TM Distance: >3 FB     Dental  (+) Partial Upper   Pulmonary shortness of breath and with exertion,  breath sounds clear to auscultation        Cardiovascular hypertension, Pt. on medications and Pt. on home beta blockers +CHF and + DOE Rhythm:Regular Rate:Normal     Neuro/Psych    GI/Hepatic GERD-  Controlled,(+)     substance abuse  alcohol use,   Endo/Other  diabetes, Type 2, Insulin Dependent  Renal/GU ESRFRenal disease     Musculoskeletal  (+) Arthritis -,   Abdominal   Peds  Hematology  (+) anemia ,   Anesthesia Other Findings   Reproductive/Obstetrics                             Anesthesia Physical Anesthesia Plan  ASA: III  Anesthesia Plan: MAC   Post-op Pain Management:    Induction: Intravenous  Airway Management Planned: Nasal Cannula  Additional Equipment:   Intra-op Plan:   Post-operative Plan:   Informed Consent: I have reviewed the patients History and Physical, chart, labs and discussed the procedure including the risks, benefits and alternatives for the proposed anesthesia with the patient or authorized representative who has indicated his/her understanding and acceptance.     Plan Discussed with:   Anesthesia Plan Comments:         Anesthesia Quick Evaluation

## 2014-07-30 NOTE — Anesthesia Postprocedure Evaluation (Signed)
  Anesthesia Post-op Note  Patient: Jacob [name]  Procedure(s) Performed: Procedure(s) with comments: CATARACT EXTRACTION PHACO AND INTRAOCULAR LENS PLACEMENT (IOC) (Right) - CDE 8.99  Patient Location: PACU and Short Stay  Anesthesia Type:MAC  Level of Consciousness: awake and patient cooperative  Airway and Oxygen Therapy: Patient Spontanous Breathing  Post-op Pain: none  Post-op Assessment: Post-op Vital signs reviewed, Patient's Cardiovascular Status Stable, Respiratory Function Stable and Patent Airway  Post-op Vital Signs: Reviewed and stable  Last Vitals:  Filed Vitals:   07/30/14 0945  BP: 176/62  Pulse:   Resp: 25    Complications: No apparent anesthesia complications. Drinking ginger ale. Good thrill right arm.

## 2014-07-30 NOTE — Progress Notes (Signed)
Patient given labetalol 10 mg for BP and grahm crackers  And cola for sugar per dr Duwayne Heck. Dr Duwayne Heck aware of improvement in BP and accucheck of 137. Ok to discharge patient home.

## 2014-07-30 NOTE — Transfer of Care (Signed)
Immediate Anesthesia Transfer of Care Note  Patient: Jacob [name]  Procedure(s) Performed: Procedure(s) (LRB): CATARACT EXTRACTION PHACO AND INTRAOCULAR LENS PLACEMENT (IOC) (Right)  Patient Location: Shortstay  Anesthesia Type: MAC  Level of Consciousness: awake  Airway & Oxygen Therapy: Patient Spontanous Breathing   Post-op Assessment: Report given to PACU RN, Post -op Vital signs reviewed and stable and Patient moving all extremities  Post vital signs: Reviewed and stable  Complications: No apparent anesthesia complications

## 2014-07-31 ENCOUNTER — Encounter (HOSPITAL_COMMUNITY): Payer: Self-pay | Admitting: Ophthalmology

## 2014-07-31 DIAGNOSIS — Z794 Long term (current) use of insulin: Secondary | ICD-10-CM | POA: Diagnosis not present

## 2014-07-31 DIAGNOSIS — E119 Type 2 diabetes mellitus without complications: Secondary | ICD-10-CM | POA: Diagnosis not present

## 2014-07-31 DIAGNOSIS — N186 End stage renal disease: Secondary | ICD-10-CM | POA: Diagnosis not present

## 2014-07-31 DIAGNOSIS — Z992 Dependence on renal dialysis: Secondary | ICD-10-CM | POA: Diagnosis not present

## 2014-08-02 DIAGNOSIS — Z992 Dependence on renal dialysis: Secondary | ICD-10-CM | POA: Diagnosis not present

## 2014-08-02 DIAGNOSIS — N186 End stage renal disease: Secondary | ICD-10-CM | POA: Diagnosis not present

## 2014-08-03 DIAGNOSIS — Z8673 Personal history of transient ischemic attack (TIA), and cerebral infarction without residual deficits: Secondary | ICD-10-CM

## 2014-08-03 DIAGNOSIS — N186 End stage renal disease: Secondary | ICD-10-CM | POA: Diagnosis not present

## 2014-08-03 DIAGNOSIS — D509 Iron deficiency anemia, unspecified: Secondary | ICD-10-CM | POA: Diagnosis not present

## 2014-08-03 DIAGNOSIS — Z992 Dependence on renal dialysis: Secondary | ICD-10-CM | POA: Diagnosis not present

## 2014-08-03 DIAGNOSIS — N2581 Secondary hyperparathyroidism of renal origin: Secondary | ICD-10-CM | POA: Diagnosis not present

## 2014-08-03 HISTORY — DX: Personal history of transient ischemic attack (TIA), and cerebral infarction without residual deficits: Z86.73

## 2014-08-06 DIAGNOSIS — N186 End stage renal disease: Secondary | ICD-10-CM | POA: Diagnosis not present

## 2014-08-06 DIAGNOSIS — Z992 Dependence on renal dialysis: Secondary | ICD-10-CM | POA: Diagnosis not present

## 2014-08-06 DIAGNOSIS — D509 Iron deficiency anemia, unspecified: Secondary | ICD-10-CM | POA: Diagnosis not present

## 2014-08-06 DIAGNOSIS — N2581 Secondary hyperparathyroidism of renal origin: Secondary | ICD-10-CM | POA: Diagnosis not present

## 2014-08-08 DIAGNOSIS — D509 Iron deficiency anemia, unspecified: Secondary | ICD-10-CM | POA: Diagnosis not present

## 2014-08-08 DIAGNOSIS — Z992 Dependence on renal dialysis: Secondary | ICD-10-CM | POA: Diagnosis not present

## 2014-08-08 DIAGNOSIS — N2581 Secondary hyperparathyroidism of renal origin: Secondary | ICD-10-CM | POA: Diagnosis not present

## 2014-08-08 DIAGNOSIS — N186 End stage renal disease: Secondary | ICD-10-CM | POA: Diagnosis not present

## 2014-08-10 DIAGNOSIS — N2581 Secondary hyperparathyroidism of renal origin: Secondary | ICD-10-CM | POA: Diagnosis not present

## 2014-08-10 DIAGNOSIS — D509 Iron deficiency anemia, unspecified: Secondary | ICD-10-CM | POA: Diagnosis not present

## 2014-08-10 DIAGNOSIS — N186 End stage renal disease: Secondary | ICD-10-CM | POA: Diagnosis not present

## 2014-08-10 DIAGNOSIS — Z992 Dependence on renal dialysis: Secondary | ICD-10-CM | POA: Diagnosis not present

## 2014-08-13 DIAGNOSIS — D509 Iron deficiency anemia, unspecified: Secondary | ICD-10-CM | POA: Diagnosis not present

## 2014-08-13 DIAGNOSIS — N186 End stage renal disease: Secondary | ICD-10-CM | POA: Diagnosis not present

## 2014-08-13 DIAGNOSIS — N2581 Secondary hyperparathyroidism of renal origin: Secondary | ICD-10-CM | POA: Diagnosis not present

## 2014-08-13 DIAGNOSIS — Z992 Dependence on renal dialysis: Secondary | ICD-10-CM | POA: Diagnosis not present

## 2014-08-15 DIAGNOSIS — N2581 Secondary hyperparathyroidism of renal origin: Secondary | ICD-10-CM | POA: Diagnosis not present

## 2014-08-15 DIAGNOSIS — N186 End stage renal disease: Secondary | ICD-10-CM | POA: Diagnosis not present

## 2014-08-15 DIAGNOSIS — Z992 Dependence on renal dialysis: Secondary | ICD-10-CM | POA: Diagnosis not present

## 2014-08-15 DIAGNOSIS — D509 Iron deficiency anemia, unspecified: Secondary | ICD-10-CM | POA: Diagnosis not present

## 2014-08-16 DIAGNOSIS — H25811 Combined forms of age-related cataract, right eye: Secondary | ICD-10-CM | POA: Diagnosis not present

## 2014-08-17 DIAGNOSIS — D509 Iron deficiency anemia, unspecified: Secondary | ICD-10-CM | POA: Diagnosis not present

## 2014-08-17 DIAGNOSIS — Z992 Dependence on renal dialysis: Secondary | ICD-10-CM | POA: Diagnosis not present

## 2014-08-17 DIAGNOSIS — N2581 Secondary hyperparathyroidism of renal origin: Secondary | ICD-10-CM | POA: Diagnosis not present

## 2014-08-17 DIAGNOSIS — N186 End stage renal disease: Secondary | ICD-10-CM | POA: Diagnosis not present

## 2014-08-20 DIAGNOSIS — Z992 Dependence on renal dialysis: Secondary | ICD-10-CM | POA: Diagnosis not present

## 2014-08-20 DIAGNOSIS — D509 Iron deficiency anemia, unspecified: Secondary | ICD-10-CM | POA: Diagnosis not present

## 2014-08-20 DIAGNOSIS — N2581 Secondary hyperparathyroidism of renal origin: Secondary | ICD-10-CM | POA: Diagnosis not present

## 2014-08-20 DIAGNOSIS — N186 End stage renal disease: Secondary | ICD-10-CM | POA: Diagnosis not present

## 2014-08-22 DIAGNOSIS — D509 Iron deficiency anemia, unspecified: Secondary | ICD-10-CM | POA: Diagnosis not present

## 2014-08-22 DIAGNOSIS — N2581 Secondary hyperparathyroidism of renal origin: Secondary | ICD-10-CM | POA: Diagnosis not present

## 2014-08-22 DIAGNOSIS — Z992 Dependence on renal dialysis: Secondary | ICD-10-CM | POA: Diagnosis not present

## 2014-08-22 DIAGNOSIS — N186 End stage renal disease: Secondary | ICD-10-CM | POA: Diagnosis not present

## 2014-08-24 DIAGNOSIS — N2581 Secondary hyperparathyroidism of renal origin: Secondary | ICD-10-CM | POA: Diagnosis not present

## 2014-08-24 DIAGNOSIS — D509 Iron deficiency anemia, unspecified: Secondary | ICD-10-CM | POA: Diagnosis not present

## 2014-08-24 DIAGNOSIS — Z992 Dependence on renal dialysis: Secondary | ICD-10-CM | POA: Diagnosis not present

## 2014-08-24 DIAGNOSIS — N186 End stage renal disease: Secondary | ICD-10-CM | POA: Diagnosis not present

## 2014-08-27 DIAGNOSIS — Z992 Dependence on renal dialysis: Secondary | ICD-10-CM | POA: Diagnosis not present

## 2014-08-27 DIAGNOSIS — N186 End stage renal disease: Secondary | ICD-10-CM | POA: Diagnosis not present

## 2014-08-27 DIAGNOSIS — N2581 Secondary hyperparathyroidism of renal origin: Secondary | ICD-10-CM | POA: Diagnosis not present

## 2014-08-27 DIAGNOSIS — D509 Iron deficiency anemia, unspecified: Secondary | ICD-10-CM | POA: Diagnosis not present

## 2014-08-29 DIAGNOSIS — N2581 Secondary hyperparathyroidism of renal origin: Secondary | ICD-10-CM | POA: Diagnosis not present

## 2014-08-29 DIAGNOSIS — Z992 Dependence on renal dialysis: Secondary | ICD-10-CM | POA: Diagnosis not present

## 2014-08-29 DIAGNOSIS — D509 Iron deficiency anemia, unspecified: Secondary | ICD-10-CM | POA: Diagnosis not present

## 2014-08-29 DIAGNOSIS — N186 End stage renal disease: Secondary | ICD-10-CM | POA: Diagnosis not present

## 2014-08-31 DIAGNOSIS — D509 Iron deficiency anemia, unspecified: Secondary | ICD-10-CM | POA: Diagnosis not present

## 2014-08-31 DIAGNOSIS — Z992 Dependence on renal dialysis: Secondary | ICD-10-CM | POA: Diagnosis not present

## 2014-08-31 DIAGNOSIS — N186 End stage renal disease: Secondary | ICD-10-CM | POA: Diagnosis not present

## 2014-08-31 DIAGNOSIS — N2581 Secondary hyperparathyroidism of renal origin: Secondary | ICD-10-CM | POA: Diagnosis not present

## 2014-09-02 DIAGNOSIS — N186 End stage renal disease: Secondary | ICD-10-CM | POA: Diagnosis not present

## 2014-09-02 DIAGNOSIS — Z992 Dependence on renal dialysis: Secondary | ICD-10-CM | POA: Diagnosis not present

## 2014-09-03 DIAGNOSIS — N2581 Secondary hyperparathyroidism of renal origin: Secondary | ICD-10-CM | POA: Diagnosis not present

## 2014-09-03 DIAGNOSIS — Z992 Dependence on renal dialysis: Secondary | ICD-10-CM | POA: Diagnosis not present

## 2014-09-03 DIAGNOSIS — D509 Iron deficiency anemia, unspecified: Secondary | ICD-10-CM | POA: Diagnosis not present

## 2014-09-03 DIAGNOSIS — N186 End stage renal disease: Secondary | ICD-10-CM | POA: Diagnosis not present

## 2014-09-05 DIAGNOSIS — N186 End stage renal disease: Secondary | ICD-10-CM | POA: Diagnosis not present

## 2014-09-05 DIAGNOSIS — Z992 Dependence on renal dialysis: Secondary | ICD-10-CM | POA: Diagnosis not present

## 2014-09-05 DIAGNOSIS — D509 Iron deficiency anemia, unspecified: Secondary | ICD-10-CM | POA: Diagnosis not present

## 2014-09-05 DIAGNOSIS — N2581 Secondary hyperparathyroidism of renal origin: Secondary | ICD-10-CM | POA: Diagnosis not present

## 2014-09-07 DIAGNOSIS — N2581 Secondary hyperparathyroidism of renal origin: Secondary | ICD-10-CM | POA: Diagnosis not present

## 2014-09-07 DIAGNOSIS — N186 End stage renal disease: Secondary | ICD-10-CM | POA: Diagnosis not present

## 2014-09-07 DIAGNOSIS — D509 Iron deficiency anemia, unspecified: Secondary | ICD-10-CM | POA: Diagnosis not present

## 2014-09-07 DIAGNOSIS — Z992 Dependence on renal dialysis: Secondary | ICD-10-CM | POA: Diagnosis not present

## 2014-09-10 DIAGNOSIS — Z992 Dependence on renal dialysis: Secondary | ICD-10-CM | POA: Diagnosis not present

## 2014-09-10 DIAGNOSIS — N186 End stage renal disease: Secondary | ICD-10-CM | POA: Diagnosis not present

## 2014-09-10 DIAGNOSIS — N2581 Secondary hyperparathyroidism of renal origin: Secondary | ICD-10-CM | POA: Diagnosis not present

## 2014-09-10 DIAGNOSIS — D509 Iron deficiency anemia, unspecified: Secondary | ICD-10-CM | POA: Diagnosis not present

## 2014-09-12 DIAGNOSIS — N186 End stage renal disease: Secondary | ICD-10-CM | POA: Diagnosis not present

## 2014-09-12 DIAGNOSIS — N2581 Secondary hyperparathyroidism of renal origin: Secondary | ICD-10-CM | POA: Diagnosis not present

## 2014-09-12 DIAGNOSIS — Z992 Dependence on renal dialysis: Secondary | ICD-10-CM | POA: Diagnosis not present

## 2014-09-12 DIAGNOSIS — D509 Iron deficiency anemia, unspecified: Secondary | ICD-10-CM | POA: Diagnosis not present

## 2014-09-14 DIAGNOSIS — Z992 Dependence on renal dialysis: Secondary | ICD-10-CM | POA: Diagnosis not present

## 2014-09-14 DIAGNOSIS — N2581 Secondary hyperparathyroidism of renal origin: Secondary | ICD-10-CM | POA: Diagnosis not present

## 2014-09-14 DIAGNOSIS — N186 End stage renal disease: Secondary | ICD-10-CM | POA: Diagnosis not present

## 2014-09-14 DIAGNOSIS — D509 Iron deficiency anemia, unspecified: Secondary | ICD-10-CM | POA: Diagnosis not present

## 2014-09-17 DIAGNOSIS — D509 Iron deficiency anemia, unspecified: Secondary | ICD-10-CM | POA: Diagnosis not present

## 2014-09-17 DIAGNOSIS — Z992 Dependence on renal dialysis: Secondary | ICD-10-CM | POA: Diagnosis not present

## 2014-09-17 DIAGNOSIS — N2581 Secondary hyperparathyroidism of renal origin: Secondary | ICD-10-CM | POA: Diagnosis not present

## 2014-09-17 DIAGNOSIS — N186 End stage renal disease: Secondary | ICD-10-CM | POA: Diagnosis not present

## 2014-09-19 DIAGNOSIS — Z992 Dependence on renal dialysis: Secondary | ICD-10-CM | POA: Diagnosis not present

## 2014-09-19 DIAGNOSIS — D509 Iron deficiency anemia, unspecified: Secondary | ICD-10-CM | POA: Diagnosis not present

## 2014-09-19 DIAGNOSIS — N2581 Secondary hyperparathyroidism of renal origin: Secondary | ICD-10-CM | POA: Diagnosis not present

## 2014-09-19 DIAGNOSIS — N186 End stage renal disease: Secondary | ICD-10-CM | POA: Diagnosis not present

## 2014-09-21 DIAGNOSIS — D509 Iron deficiency anemia, unspecified: Secondary | ICD-10-CM | POA: Diagnosis not present

## 2014-09-21 DIAGNOSIS — Z992 Dependence on renal dialysis: Secondary | ICD-10-CM | POA: Diagnosis not present

## 2014-09-21 DIAGNOSIS — N2581 Secondary hyperparathyroidism of renal origin: Secondary | ICD-10-CM | POA: Diagnosis not present

## 2014-09-21 DIAGNOSIS — N186 End stage renal disease: Secondary | ICD-10-CM | POA: Diagnosis not present

## 2014-09-24 DIAGNOSIS — D509 Iron deficiency anemia, unspecified: Secondary | ICD-10-CM | POA: Diagnosis not present

## 2014-09-24 DIAGNOSIS — N2581 Secondary hyperparathyroidism of renal origin: Secondary | ICD-10-CM | POA: Diagnosis not present

## 2014-09-24 DIAGNOSIS — Z992 Dependence on renal dialysis: Secondary | ICD-10-CM | POA: Diagnosis not present

## 2014-09-24 DIAGNOSIS — N186 End stage renal disease: Secondary | ICD-10-CM | POA: Diagnosis not present

## 2014-09-26 DIAGNOSIS — N186 End stage renal disease: Secondary | ICD-10-CM | POA: Diagnosis not present

## 2014-09-26 DIAGNOSIS — Z992 Dependence on renal dialysis: Secondary | ICD-10-CM | POA: Diagnosis not present

## 2014-09-26 DIAGNOSIS — D509 Iron deficiency anemia, unspecified: Secondary | ICD-10-CM | POA: Diagnosis not present

## 2014-09-26 DIAGNOSIS — N2581 Secondary hyperparathyroidism of renal origin: Secondary | ICD-10-CM | POA: Diagnosis not present

## 2014-09-28 DIAGNOSIS — Z992 Dependence on renal dialysis: Secondary | ICD-10-CM | POA: Diagnosis not present

## 2014-09-28 DIAGNOSIS — N2581 Secondary hyperparathyroidism of renal origin: Secondary | ICD-10-CM | POA: Diagnosis not present

## 2014-09-28 DIAGNOSIS — D509 Iron deficiency anemia, unspecified: Secondary | ICD-10-CM | POA: Diagnosis not present

## 2014-09-28 DIAGNOSIS — N186 End stage renal disease: Secondary | ICD-10-CM | POA: Diagnosis not present

## 2014-10-01 DIAGNOSIS — N186 End stage renal disease: Secondary | ICD-10-CM | POA: Diagnosis not present

## 2014-10-01 DIAGNOSIS — D509 Iron deficiency anemia, unspecified: Secondary | ICD-10-CM | POA: Diagnosis not present

## 2014-10-01 DIAGNOSIS — Z992 Dependence on renal dialysis: Secondary | ICD-10-CM | POA: Diagnosis not present

## 2014-10-01 DIAGNOSIS — N2581 Secondary hyperparathyroidism of renal origin: Secondary | ICD-10-CM | POA: Diagnosis not present

## 2014-10-03 DIAGNOSIS — N2581 Secondary hyperparathyroidism of renal origin: Secondary | ICD-10-CM | POA: Diagnosis not present

## 2014-10-03 DIAGNOSIS — D509 Iron deficiency anemia, unspecified: Secondary | ICD-10-CM | POA: Diagnosis not present

## 2014-10-03 DIAGNOSIS — Z992 Dependence on renal dialysis: Secondary | ICD-10-CM | POA: Diagnosis not present

## 2014-10-03 DIAGNOSIS — N186 End stage renal disease: Secondary | ICD-10-CM | POA: Diagnosis not present

## 2014-10-05 DIAGNOSIS — N186 End stage renal disease: Secondary | ICD-10-CM | POA: Diagnosis not present

## 2014-10-05 DIAGNOSIS — D509 Iron deficiency anemia, unspecified: Secondary | ICD-10-CM | POA: Diagnosis not present

## 2014-10-05 DIAGNOSIS — Z992 Dependence on renal dialysis: Secondary | ICD-10-CM | POA: Diagnosis not present

## 2014-10-05 DIAGNOSIS — N2581 Secondary hyperparathyroidism of renal origin: Secondary | ICD-10-CM | POA: Diagnosis not present

## 2014-10-06 DIAGNOSIS — R404 Transient alteration of awareness: Secondary | ICD-10-CM | POA: Diagnosis not present

## 2014-10-06 DIAGNOSIS — Z91038 Other insect allergy status: Secondary | ICD-10-CM | POA: Diagnosis not present

## 2014-10-06 DIAGNOSIS — R61 Generalized hyperhidrosis: Secondary | ICD-10-CM | POA: Diagnosis not present

## 2014-10-06 DIAGNOSIS — Z794 Long term (current) use of insulin: Secondary | ICD-10-CM | POA: Diagnosis not present

## 2014-10-06 DIAGNOSIS — Z7982 Long term (current) use of aspirin: Secondary | ICD-10-CM | POA: Diagnosis not present

## 2014-10-06 DIAGNOSIS — T68XXXA Hypothermia, initial encounter: Secondary | ICD-10-CM | POA: Diagnosis not present

## 2014-10-06 DIAGNOSIS — Z833 Family history of diabetes mellitus: Secondary | ICD-10-CM | POA: Diagnosis not present

## 2014-10-06 DIAGNOSIS — R739 Hyperglycemia, unspecified: Secondary | ICD-10-CM | POA: Diagnosis not present

## 2014-10-06 DIAGNOSIS — J449 Chronic obstructive pulmonary disease, unspecified: Secondary | ICD-10-CM | POA: Diagnosis not present

## 2014-10-06 DIAGNOSIS — N186 End stage renal disease: Secondary | ICD-10-CM | POA: Diagnosis not present

## 2014-10-06 DIAGNOSIS — Z992 Dependence on renal dialysis: Secondary | ICD-10-CM | POA: Diagnosis not present

## 2014-10-06 DIAGNOSIS — R680 Hypothermia, not associated with low environmental temperature: Secondary | ICD-10-CM | POA: Diagnosis not present

## 2014-10-06 DIAGNOSIS — Z8249 Family history of ischemic heart disease and other diseases of the circulatory system: Secondary | ICD-10-CM | POA: Diagnosis not present

## 2014-10-06 DIAGNOSIS — R531 Weakness: Secondary | ICD-10-CM | POA: Diagnosis not present

## 2014-10-06 DIAGNOSIS — D631 Anemia in chronic kidney disease: Secondary | ICD-10-CM | POA: Diagnosis not present

## 2014-10-06 DIAGNOSIS — Z79899 Other long term (current) drug therapy: Secondary | ICD-10-CM | POA: Diagnosis not present

## 2014-10-06 DIAGNOSIS — I120 Hypertensive chronic kidney disease with stage 5 chronic kidney disease or end stage renal disease: Secondary | ICD-10-CM | POA: Diagnosis not present

## 2014-10-06 DIAGNOSIS — Z6838 Body mass index (BMI) 38.0-38.9, adult: Secondary | ICD-10-CM | POA: Diagnosis not present

## 2014-10-06 DIAGNOSIS — E11649 Type 2 diabetes mellitus with hypoglycemia without coma: Secondary | ICD-10-CM | POA: Diagnosis not present

## 2014-10-06 DIAGNOSIS — Z880 Allergy status to penicillin: Secondary | ICD-10-CM | POA: Diagnosis not present

## 2014-10-06 DIAGNOSIS — I517 Cardiomegaly: Secondary | ICD-10-CM | POA: Diagnosis not present

## 2014-10-07 DIAGNOSIS — E162 Hypoglycemia, unspecified: Secondary | ICD-10-CM | POA: Diagnosis not present

## 2014-10-08 DIAGNOSIS — N186 End stage renal disease: Secondary | ICD-10-CM | POA: Diagnosis not present

## 2014-10-08 DIAGNOSIS — D509 Iron deficiency anemia, unspecified: Secondary | ICD-10-CM | POA: Diagnosis not present

## 2014-10-08 DIAGNOSIS — Z992 Dependence on renal dialysis: Secondary | ICD-10-CM | POA: Diagnosis not present

## 2014-10-08 DIAGNOSIS — N2581 Secondary hyperparathyroidism of renal origin: Secondary | ICD-10-CM | POA: Diagnosis not present

## 2014-10-10 DIAGNOSIS — D509 Iron deficiency anemia, unspecified: Secondary | ICD-10-CM | POA: Diagnosis not present

## 2014-10-10 DIAGNOSIS — N2581 Secondary hyperparathyroidism of renal origin: Secondary | ICD-10-CM | POA: Diagnosis not present

## 2014-10-10 DIAGNOSIS — Z992 Dependence on renal dialysis: Secondary | ICD-10-CM | POA: Diagnosis not present

## 2014-10-10 DIAGNOSIS — N186 End stage renal disease: Secondary | ICD-10-CM | POA: Diagnosis not present

## 2014-10-12 DIAGNOSIS — D509 Iron deficiency anemia, unspecified: Secondary | ICD-10-CM | POA: Diagnosis not present

## 2014-10-12 DIAGNOSIS — Z992 Dependence on renal dialysis: Secondary | ICD-10-CM | POA: Diagnosis not present

## 2014-10-12 DIAGNOSIS — N2581 Secondary hyperparathyroidism of renal origin: Secondary | ICD-10-CM | POA: Diagnosis not present

## 2014-10-12 DIAGNOSIS — N186 End stage renal disease: Secondary | ICD-10-CM | POA: Diagnosis not present

## 2014-10-15 DIAGNOSIS — Z992 Dependence on renal dialysis: Secondary | ICD-10-CM | POA: Diagnosis not present

## 2014-10-15 DIAGNOSIS — D509 Iron deficiency anemia, unspecified: Secondary | ICD-10-CM | POA: Diagnosis not present

## 2014-10-15 DIAGNOSIS — N186 End stage renal disease: Secondary | ICD-10-CM | POA: Diagnosis not present

## 2014-10-15 DIAGNOSIS — N2581 Secondary hyperparathyroidism of renal origin: Secondary | ICD-10-CM | POA: Diagnosis not present

## 2014-10-17 DIAGNOSIS — Z992 Dependence on renal dialysis: Secondary | ICD-10-CM | POA: Diagnosis not present

## 2014-10-17 DIAGNOSIS — N186 End stage renal disease: Secondary | ICD-10-CM | POA: Diagnosis not present

## 2014-10-17 DIAGNOSIS — N2581 Secondary hyperparathyroidism of renal origin: Secondary | ICD-10-CM | POA: Diagnosis not present

## 2014-10-17 DIAGNOSIS — D509 Iron deficiency anemia, unspecified: Secondary | ICD-10-CM | POA: Diagnosis not present

## 2014-10-19 DIAGNOSIS — D509 Iron deficiency anemia, unspecified: Secondary | ICD-10-CM | POA: Diagnosis not present

## 2014-10-19 DIAGNOSIS — N2581 Secondary hyperparathyroidism of renal origin: Secondary | ICD-10-CM | POA: Diagnosis not present

## 2014-10-19 DIAGNOSIS — Z992 Dependence on renal dialysis: Secondary | ICD-10-CM | POA: Diagnosis not present

## 2014-10-19 DIAGNOSIS — N186 End stage renal disease: Secondary | ICD-10-CM | POA: Diagnosis not present

## 2014-10-22 DIAGNOSIS — D509 Iron deficiency anemia, unspecified: Secondary | ICD-10-CM | POA: Diagnosis not present

## 2014-10-22 DIAGNOSIS — N186 End stage renal disease: Secondary | ICD-10-CM | POA: Diagnosis not present

## 2014-10-22 DIAGNOSIS — Z992 Dependence on renal dialysis: Secondary | ICD-10-CM | POA: Diagnosis not present

## 2014-10-22 DIAGNOSIS — N2581 Secondary hyperparathyroidism of renal origin: Secondary | ICD-10-CM | POA: Diagnosis not present

## 2014-10-24 DIAGNOSIS — N186 End stage renal disease: Secondary | ICD-10-CM | POA: Diagnosis not present

## 2014-10-24 DIAGNOSIS — N2581 Secondary hyperparathyroidism of renal origin: Secondary | ICD-10-CM | POA: Diagnosis not present

## 2014-10-24 DIAGNOSIS — Z992 Dependence on renal dialysis: Secondary | ICD-10-CM | POA: Diagnosis not present

## 2014-10-24 DIAGNOSIS — D509 Iron deficiency anemia, unspecified: Secondary | ICD-10-CM | POA: Diagnosis not present

## 2014-10-26 DIAGNOSIS — Z992 Dependence on renal dialysis: Secondary | ICD-10-CM | POA: Diagnosis not present

## 2014-10-26 DIAGNOSIS — D509 Iron deficiency anemia, unspecified: Secondary | ICD-10-CM | POA: Diagnosis not present

## 2014-10-26 DIAGNOSIS — N186 End stage renal disease: Secondary | ICD-10-CM | POA: Diagnosis not present

## 2014-10-26 DIAGNOSIS — N2581 Secondary hyperparathyroidism of renal origin: Secondary | ICD-10-CM | POA: Diagnosis not present

## 2014-10-29 DIAGNOSIS — D509 Iron deficiency anemia, unspecified: Secondary | ICD-10-CM | POA: Diagnosis not present

## 2014-10-29 DIAGNOSIS — Z992 Dependence on renal dialysis: Secondary | ICD-10-CM | POA: Diagnosis not present

## 2014-10-29 DIAGNOSIS — E119 Type 2 diabetes mellitus without complications: Secondary | ICD-10-CM | POA: Diagnosis not present

## 2014-10-29 DIAGNOSIS — Z794 Long term (current) use of insulin: Secondary | ICD-10-CM | POA: Diagnosis not present

## 2014-10-29 DIAGNOSIS — N2581 Secondary hyperparathyroidism of renal origin: Secondary | ICD-10-CM | POA: Diagnosis not present

## 2014-10-29 DIAGNOSIS — N186 End stage renal disease: Secondary | ICD-10-CM | POA: Diagnosis not present

## 2014-10-31 DIAGNOSIS — D509 Iron deficiency anemia, unspecified: Secondary | ICD-10-CM | POA: Diagnosis not present

## 2014-10-31 DIAGNOSIS — Z992 Dependence on renal dialysis: Secondary | ICD-10-CM | POA: Diagnosis not present

## 2014-10-31 DIAGNOSIS — N186 End stage renal disease: Secondary | ICD-10-CM | POA: Diagnosis not present

## 2014-10-31 DIAGNOSIS — N2581 Secondary hyperparathyroidism of renal origin: Secondary | ICD-10-CM | POA: Diagnosis not present

## 2014-11-01 DIAGNOSIS — Z992 Dependence on renal dialysis: Secondary | ICD-10-CM | POA: Diagnosis not present

## 2014-11-01 DIAGNOSIS — N186 End stage renal disease: Secondary | ICD-10-CM | POA: Diagnosis not present

## 2014-11-02 DIAGNOSIS — N186 End stage renal disease: Secondary | ICD-10-CM | POA: Diagnosis not present

## 2014-11-02 DIAGNOSIS — D509 Iron deficiency anemia, unspecified: Secondary | ICD-10-CM | POA: Diagnosis not present

## 2014-11-02 DIAGNOSIS — Z992 Dependence on renal dialysis: Secondary | ICD-10-CM | POA: Diagnosis not present

## 2014-11-02 DIAGNOSIS — N2581 Secondary hyperparathyroidism of renal origin: Secondary | ICD-10-CM | POA: Diagnosis not present

## 2014-11-05 DIAGNOSIS — N186 End stage renal disease: Secondary | ICD-10-CM | POA: Diagnosis not present

## 2014-11-05 DIAGNOSIS — N2581 Secondary hyperparathyroidism of renal origin: Secondary | ICD-10-CM | POA: Diagnosis not present

## 2014-11-05 DIAGNOSIS — Z992 Dependence on renal dialysis: Secondary | ICD-10-CM | POA: Diagnosis not present

## 2014-11-05 DIAGNOSIS — D509 Iron deficiency anemia, unspecified: Secondary | ICD-10-CM | POA: Diagnosis not present

## 2014-11-07 DIAGNOSIS — Z992 Dependence on renal dialysis: Secondary | ICD-10-CM | POA: Diagnosis not present

## 2014-11-07 DIAGNOSIS — N186 End stage renal disease: Secondary | ICD-10-CM | POA: Diagnosis not present

## 2014-11-07 DIAGNOSIS — D509 Iron deficiency anemia, unspecified: Secondary | ICD-10-CM | POA: Diagnosis not present

## 2014-11-07 DIAGNOSIS — N2581 Secondary hyperparathyroidism of renal origin: Secondary | ICD-10-CM | POA: Diagnosis not present

## 2014-11-09 DIAGNOSIS — D509 Iron deficiency anemia, unspecified: Secondary | ICD-10-CM | POA: Diagnosis not present

## 2014-11-09 DIAGNOSIS — N186 End stage renal disease: Secondary | ICD-10-CM | POA: Diagnosis not present

## 2014-11-09 DIAGNOSIS — N2581 Secondary hyperparathyroidism of renal origin: Secondary | ICD-10-CM | POA: Diagnosis not present

## 2014-11-09 DIAGNOSIS — Z992 Dependence on renal dialysis: Secondary | ICD-10-CM | POA: Diagnosis not present

## 2014-11-12 DIAGNOSIS — Z992 Dependence on renal dialysis: Secondary | ICD-10-CM | POA: Diagnosis not present

## 2014-11-12 DIAGNOSIS — D509 Iron deficiency anemia, unspecified: Secondary | ICD-10-CM | POA: Diagnosis not present

## 2014-11-12 DIAGNOSIS — N186 End stage renal disease: Secondary | ICD-10-CM | POA: Diagnosis not present

## 2014-11-12 DIAGNOSIS — N2581 Secondary hyperparathyroidism of renal origin: Secondary | ICD-10-CM | POA: Diagnosis not present

## 2014-11-14 DIAGNOSIS — N2581 Secondary hyperparathyroidism of renal origin: Secondary | ICD-10-CM | POA: Diagnosis not present

## 2014-11-14 DIAGNOSIS — Z992 Dependence on renal dialysis: Secondary | ICD-10-CM | POA: Diagnosis not present

## 2014-11-14 DIAGNOSIS — N186 End stage renal disease: Secondary | ICD-10-CM | POA: Diagnosis not present

## 2014-11-14 DIAGNOSIS — D509 Iron deficiency anemia, unspecified: Secondary | ICD-10-CM | POA: Diagnosis not present

## 2014-11-16 DIAGNOSIS — N2581 Secondary hyperparathyroidism of renal origin: Secondary | ICD-10-CM | POA: Diagnosis not present

## 2014-11-16 DIAGNOSIS — D509 Iron deficiency anemia, unspecified: Secondary | ICD-10-CM | POA: Diagnosis not present

## 2014-11-16 DIAGNOSIS — N186 End stage renal disease: Secondary | ICD-10-CM | POA: Diagnosis not present

## 2014-11-16 DIAGNOSIS — Z992 Dependence on renal dialysis: Secondary | ICD-10-CM | POA: Diagnosis not present

## 2014-11-19 DIAGNOSIS — N2581 Secondary hyperparathyroidism of renal origin: Secondary | ICD-10-CM | POA: Diagnosis not present

## 2014-11-19 DIAGNOSIS — N186 End stage renal disease: Secondary | ICD-10-CM | POA: Diagnosis not present

## 2014-11-19 DIAGNOSIS — Z992 Dependence on renal dialysis: Secondary | ICD-10-CM | POA: Diagnosis not present

## 2014-11-19 DIAGNOSIS — D509 Iron deficiency anemia, unspecified: Secondary | ICD-10-CM | POA: Diagnosis not present

## 2014-11-21 DIAGNOSIS — N2581 Secondary hyperparathyroidism of renal origin: Secondary | ICD-10-CM | POA: Diagnosis not present

## 2014-11-21 DIAGNOSIS — D509 Iron deficiency anemia, unspecified: Secondary | ICD-10-CM | POA: Diagnosis not present

## 2014-11-21 DIAGNOSIS — N186 End stage renal disease: Secondary | ICD-10-CM | POA: Diagnosis not present

## 2014-11-21 DIAGNOSIS — Z992 Dependence on renal dialysis: Secondary | ICD-10-CM | POA: Diagnosis not present

## 2014-11-23 DIAGNOSIS — D509 Iron deficiency anemia, unspecified: Secondary | ICD-10-CM | POA: Diagnosis not present

## 2014-11-23 DIAGNOSIS — N186 End stage renal disease: Secondary | ICD-10-CM | POA: Diagnosis not present

## 2014-11-23 DIAGNOSIS — Z992 Dependence on renal dialysis: Secondary | ICD-10-CM | POA: Diagnosis not present

## 2014-11-23 DIAGNOSIS — N2581 Secondary hyperparathyroidism of renal origin: Secondary | ICD-10-CM | POA: Diagnosis not present

## 2014-11-26 DIAGNOSIS — N186 End stage renal disease: Secondary | ICD-10-CM | POA: Diagnosis not present

## 2014-11-26 DIAGNOSIS — N2581 Secondary hyperparathyroidism of renal origin: Secondary | ICD-10-CM | POA: Diagnosis not present

## 2014-11-26 DIAGNOSIS — D509 Iron deficiency anemia, unspecified: Secondary | ICD-10-CM | POA: Diagnosis not present

## 2014-11-26 DIAGNOSIS — Z992 Dependence on renal dialysis: Secondary | ICD-10-CM | POA: Diagnosis not present

## 2014-11-28 DIAGNOSIS — N2581 Secondary hyperparathyroidism of renal origin: Secondary | ICD-10-CM | POA: Diagnosis not present

## 2014-11-28 DIAGNOSIS — D509 Iron deficiency anemia, unspecified: Secondary | ICD-10-CM | POA: Diagnosis not present

## 2014-11-28 DIAGNOSIS — N186 End stage renal disease: Secondary | ICD-10-CM | POA: Diagnosis not present

## 2014-11-28 DIAGNOSIS — Z992 Dependence on renal dialysis: Secondary | ICD-10-CM | POA: Diagnosis not present

## 2014-11-30 DIAGNOSIS — N2581 Secondary hyperparathyroidism of renal origin: Secondary | ICD-10-CM | POA: Diagnosis not present

## 2014-11-30 DIAGNOSIS — D509 Iron deficiency anemia, unspecified: Secondary | ICD-10-CM | POA: Diagnosis not present

## 2014-11-30 DIAGNOSIS — N186 End stage renal disease: Secondary | ICD-10-CM | POA: Diagnosis not present

## 2014-11-30 DIAGNOSIS — Z992 Dependence on renal dialysis: Secondary | ICD-10-CM | POA: Diagnosis not present

## 2014-12-01 DIAGNOSIS — N186 End stage renal disease: Secondary | ICD-10-CM | POA: Diagnosis not present

## 2014-12-01 DIAGNOSIS — Z992 Dependence on renal dialysis: Secondary | ICD-10-CM | POA: Diagnosis not present

## 2014-12-03 DIAGNOSIS — N186 End stage renal disease: Secondary | ICD-10-CM | POA: Diagnosis not present

## 2014-12-03 DIAGNOSIS — N2581 Secondary hyperparathyroidism of renal origin: Secondary | ICD-10-CM | POA: Diagnosis not present

## 2014-12-03 DIAGNOSIS — D509 Iron deficiency anemia, unspecified: Secondary | ICD-10-CM | POA: Diagnosis not present

## 2014-12-03 DIAGNOSIS — Z992 Dependence on renal dialysis: Secondary | ICD-10-CM | POA: Diagnosis not present

## 2014-12-05 DIAGNOSIS — N186 End stage renal disease: Secondary | ICD-10-CM | POA: Diagnosis not present

## 2014-12-05 DIAGNOSIS — Z992 Dependence on renal dialysis: Secondary | ICD-10-CM | POA: Diagnosis not present

## 2014-12-05 DIAGNOSIS — D509 Iron deficiency anemia, unspecified: Secondary | ICD-10-CM | POA: Diagnosis not present

## 2014-12-05 DIAGNOSIS — N2581 Secondary hyperparathyroidism of renal origin: Secondary | ICD-10-CM | POA: Diagnosis not present

## 2014-12-07 DIAGNOSIS — D509 Iron deficiency anemia, unspecified: Secondary | ICD-10-CM | POA: Diagnosis not present

## 2014-12-07 DIAGNOSIS — N186 End stage renal disease: Secondary | ICD-10-CM | POA: Diagnosis not present

## 2014-12-07 DIAGNOSIS — N2581 Secondary hyperparathyroidism of renal origin: Secondary | ICD-10-CM | POA: Diagnosis not present

## 2014-12-07 DIAGNOSIS — Z992 Dependence on renal dialysis: Secondary | ICD-10-CM | POA: Diagnosis not present

## 2014-12-10 DIAGNOSIS — Z992 Dependence on renal dialysis: Secondary | ICD-10-CM | POA: Diagnosis not present

## 2014-12-10 DIAGNOSIS — D509 Iron deficiency anemia, unspecified: Secondary | ICD-10-CM | POA: Diagnosis not present

## 2014-12-10 DIAGNOSIS — N186 End stage renal disease: Secondary | ICD-10-CM | POA: Diagnosis not present

## 2014-12-10 DIAGNOSIS — N2581 Secondary hyperparathyroidism of renal origin: Secondary | ICD-10-CM | POA: Diagnosis not present

## 2014-12-12 DIAGNOSIS — N186 End stage renal disease: Secondary | ICD-10-CM | POA: Diagnosis not present

## 2014-12-12 DIAGNOSIS — N2581 Secondary hyperparathyroidism of renal origin: Secondary | ICD-10-CM | POA: Diagnosis not present

## 2014-12-12 DIAGNOSIS — D509 Iron deficiency anemia, unspecified: Secondary | ICD-10-CM | POA: Diagnosis not present

## 2014-12-12 DIAGNOSIS — Z992 Dependence on renal dialysis: Secondary | ICD-10-CM | POA: Diagnosis not present

## 2014-12-14 DIAGNOSIS — N186 End stage renal disease: Secondary | ICD-10-CM | POA: Diagnosis not present

## 2014-12-14 DIAGNOSIS — Z992 Dependence on renal dialysis: Secondary | ICD-10-CM | POA: Diagnosis not present

## 2014-12-14 DIAGNOSIS — D509 Iron deficiency anemia, unspecified: Secondary | ICD-10-CM | POA: Diagnosis not present

## 2014-12-14 DIAGNOSIS — N2581 Secondary hyperparathyroidism of renal origin: Secondary | ICD-10-CM | POA: Diagnosis not present

## 2014-12-17 DIAGNOSIS — D509 Iron deficiency anemia, unspecified: Secondary | ICD-10-CM | POA: Diagnosis not present

## 2014-12-17 DIAGNOSIS — Z992 Dependence on renal dialysis: Secondary | ICD-10-CM | POA: Diagnosis not present

## 2014-12-17 DIAGNOSIS — N2581 Secondary hyperparathyroidism of renal origin: Secondary | ICD-10-CM | POA: Diagnosis not present

## 2014-12-17 DIAGNOSIS — N186 End stage renal disease: Secondary | ICD-10-CM | POA: Diagnosis not present

## 2014-12-19 DIAGNOSIS — D509 Iron deficiency anemia, unspecified: Secondary | ICD-10-CM | POA: Diagnosis not present

## 2014-12-19 DIAGNOSIS — N2581 Secondary hyperparathyroidism of renal origin: Secondary | ICD-10-CM | POA: Diagnosis not present

## 2014-12-19 DIAGNOSIS — N186 End stage renal disease: Secondary | ICD-10-CM | POA: Diagnosis not present

## 2014-12-19 DIAGNOSIS — Z992 Dependence on renal dialysis: Secondary | ICD-10-CM | POA: Diagnosis not present

## 2014-12-21 DIAGNOSIS — N2581 Secondary hyperparathyroidism of renal origin: Secondary | ICD-10-CM | POA: Diagnosis not present

## 2014-12-21 DIAGNOSIS — Z992 Dependence on renal dialysis: Secondary | ICD-10-CM | POA: Diagnosis not present

## 2014-12-21 DIAGNOSIS — N186 End stage renal disease: Secondary | ICD-10-CM | POA: Diagnosis not present

## 2014-12-21 DIAGNOSIS — D509 Iron deficiency anemia, unspecified: Secondary | ICD-10-CM | POA: Diagnosis not present

## 2014-12-24 DIAGNOSIS — N2581 Secondary hyperparathyroidism of renal origin: Secondary | ICD-10-CM | POA: Diagnosis not present

## 2014-12-24 DIAGNOSIS — N186 End stage renal disease: Secondary | ICD-10-CM | POA: Diagnosis not present

## 2014-12-24 DIAGNOSIS — Z992 Dependence on renal dialysis: Secondary | ICD-10-CM | POA: Diagnosis not present

## 2014-12-24 DIAGNOSIS — D509 Iron deficiency anemia, unspecified: Secondary | ICD-10-CM | POA: Diagnosis not present

## 2014-12-26 DIAGNOSIS — Z992 Dependence on renal dialysis: Secondary | ICD-10-CM | POA: Diagnosis not present

## 2014-12-26 DIAGNOSIS — N186 End stage renal disease: Secondary | ICD-10-CM | POA: Diagnosis not present

## 2014-12-26 DIAGNOSIS — D509 Iron deficiency anemia, unspecified: Secondary | ICD-10-CM | POA: Diagnosis not present

## 2014-12-26 DIAGNOSIS — N2581 Secondary hyperparathyroidism of renal origin: Secondary | ICD-10-CM | POA: Diagnosis not present

## 2014-12-28 DIAGNOSIS — Z992 Dependence on renal dialysis: Secondary | ICD-10-CM | POA: Diagnosis not present

## 2014-12-28 DIAGNOSIS — N2581 Secondary hyperparathyroidism of renal origin: Secondary | ICD-10-CM | POA: Diagnosis not present

## 2014-12-28 DIAGNOSIS — N186 End stage renal disease: Secondary | ICD-10-CM | POA: Diagnosis not present

## 2014-12-28 DIAGNOSIS — D509 Iron deficiency anemia, unspecified: Secondary | ICD-10-CM | POA: Diagnosis not present

## 2014-12-31 DIAGNOSIS — N2581 Secondary hyperparathyroidism of renal origin: Secondary | ICD-10-CM | POA: Diagnosis not present

## 2014-12-31 DIAGNOSIS — N186 End stage renal disease: Secondary | ICD-10-CM | POA: Diagnosis not present

## 2014-12-31 DIAGNOSIS — D509 Iron deficiency anemia, unspecified: Secondary | ICD-10-CM | POA: Diagnosis not present

## 2014-12-31 DIAGNOSIS — Z992 Dependence on renal dialysis: Secondary | ICD-10-CM | POA: Diagnosis not present

## 2015-01-01 DIAGNOSIS — Z992 Dependence on renal dialysis: Secondary | ICD-10-CM | POA: Diagnosis not present

## 2015-01-01 DIAGNOSIS — N186 End stage renal disease: Secondary | ICD-10-CM | POA: Diagnosis not present

## 2015-01-02 DIAGNOSIS — N186 End stage renal disease: Secondary | ICD-10-CM | POA: Diagnosis not present

## 2015-01-02 DIAGNOSIS — D509 Iron deficiency anemia, unspecified: Secondary | ICD-10-CM | POA: Diagnosis not present

## 2015-01-02 DIAGNOSIS — N2581 Secondary hyperparathyroidism of renal origin: Secondary | ICD-10-CM | POA: Diagnosis not present

## 2015-01-02 DIAGNOSIS — Z992 Dependence on renal dialysis: Secondary | ICD-10-CM | POA: Diagnosis not present

## 2015-01-04 DIAGNOSIS — Z992 Dependence on renal dialysis: Secondary | ICD-10-CM | POA: Diagnosis not present

## 2015-01-04 DIAGNOSIS — N186 End stage renal disease: Secondary | ICD-10-CM | POA: Diagnosis not present

## 2015-01-04 DIAGNOSIS — N2581 Secondary hyperparathyroidism of renal origin: Secondary | ICD-10-CM | POA: Diagnosis not present

## 2015-01-04 DIAGNOSIS — D509 Iron deficiency anemia, unspecified: Secondary | ICD-10-CM | POA: Diagnosis not present

## 2015-01-07 DIAGNOSIS — Z992 Dependence on renal dialysis: Secondary | ICD-10-CM | POA: Diagnosis not present

## 2015-01-07 DIAGNOSIS — N186 End stage renal disease: Secondary | ICD-10-CM | POA: Diagnosis not present

## 2015-01-07 DIAGNOSIS — D509 Iron deficiency anemia, unspecified: Secondary | ICD-10-CM | POA: Diagnosis not present

## 2015-01-07 DIAGNOSIS — N2581 Secondary hyperparathyroidism of renal origin: Secondary | ICD-10-CM | POA: Diagnosis not present

## 2015-01-09 DIAGNOSIS — N2581 Secondary hyperparathyroidism of renal origin: Secondary | ICD-10-CM | POA: Diagnosis not present

## 2015-01-09 DIAGNOSIS — N186 End stage renal disease: Secondary | ICD-10-CM | POA: Diagnosis not present

## 2015-01-09 DIAGNOSIS — D509 Iron deficiency anemia, unspecified: Secondary | ICD-10-CM | POA: Diagnosis not present

## 2015-01-09 DIAGNOSIS — Z992 Dependence on renal dialysis: Secondary | ICD-10-CM | POA: Diagnosis not present

## 2015-01-11 DIAGNOSIS — N2581 Secondary hyperparathyroidism of renal origin: Secondary | ICD-10-CM | POA: Diagnosis not present

## 2015-01-11 DIAGNOSIS — N186 End stage renal disease: Secondary | ICD-10-CM | POA: Diagnosis not present

## 2015-01-11 DIAGNOSIS — Z992 Dependence on renal dialysis: Secondary | ICD-10-CM | POA: Diagnosis not present

## 2015-01-11 DIAGNOSIS — D509 Iron deficiency anemia, unspecified: Secondary | ICD-10-CM | POA: Diagnosis not present

## 2015-01-14 DIAGNOSIS — D509 Iron deficiency anemia, unspecified: Secondary | ICD-10-CM | POA: Diagnosis not present

## 2015-01-14 DIAGNOSIS — N2581 Secondary hyperparathyroidism of renal origin: Secondary | ICD-10-CM | POA: Diagnosis not present

## 2015-01-14 DIAGNOSIS — Z992 Dependence on renal dialysis: Secondary | ICD-10-CM | POA: Diagnosis not present

## 2015-01-14 DIAGNOSIS — N186 End stage renal disease: Secondary | ICD-10-CM | POA: Diagnosis not present

## 2015-01-16 DIAGNOSIS — D509 Iron deficiency anemia, unspecified: Secondary | ICD-10-CM | POA: Diagnosis not present

## 2015-01-16 DIAGNOSIS — N2581 Secondary hyperparathyroidism of renal origin: Secondary | ICD-10-CM | POA: Diagnosis not present

## 2015-01-16 DIAGNOSIS — N186 End stage renal disease: Secondary | ICD-10-CM | POA: Diagnosis not present

## 2015-01-16 DIAGNOSIS — Z992 Dependence on renal dialysis: Secondary | ICD-10-CM | POA: Diagnosis not present

## 2015-01-18 ENCOUNTER — Other Ambulatory Visit: Payer: Self-pay

## 2015-01-18 DIAGNOSIS — Z79899 Other long term (current) drug therapy: Secondary | ICD-10-CM | POA: Diagnosis not present

## 2015-01-18 DIAGNOSIS — Z8249 Family history of ischemic heart disease and other diseases of the circulatory system: Secondary | ICD-10-CM | POA: Diagnosis not present

## 2015-01-18 DIAGNOSIS — E119 Type 2 diabetes mellitus without complications: Secondary | ICD-10-CM | POA: Diagnosis not present

## 2015-01-18 DIAGNOSIS — Z91030 Bee allergy status: Secondary | ICD-10-CM | POA: Diagnosis not present

## 2015-01-18 DIAGNOSIS — N2581 Secondary hyperparathyroidism of renal origin: Secondary | ICD-10-CM | POA: Diagnosis not present

## 2015-01-18 DIAGNOSIS — N186 End stage renal disease: Secondary | ICD-10-CM | POA: Diagnosis not present

## 2015-01-18 DIAGNOSIS — R531 Weakness: Secondary | ICD-10-CM | POA: Diagnosis not present

## 2015-01-18 DIAGNOSIS — I120 Hypertensive chronic kidney disease with stage 5 chronic kidney disease or end stage renal disease: Secondary | ICD-10-CM | POA: Diagnosis not present

## 2015-01-18 DIAGNOSIS — Z880 Allergy status to penicillin: Secondary | ICD-10-CM | POA: Diagnosis not present

## 2015-01-18 DIAGNOSIS — Z992 Dependence on renal dialysis: Secondary | ICD-10-CM | POA: Diagnosis not present

## 2015-01-18 DIAGNOSIS — R202 Paresthesia of skin: Secondary | ICD-10-CM | POA: Diagnosis not present

## 2015-01-18 DIAGNOSIS — Z7982 Long term (current) use of aspirin: Secondary | ICD-10-CM | POA: Diagnosis not present

## 2015-01-18 DIAGNOSIS — I509 Heart failure, unspecified: Secondary | ICD-10-CM | POA: Diagnosis not present

## 2015-01-18 DIAGNOSIS — D509 Iron deficiency anemia, unspecified: Secondary | ICD-10-CM | POA: Diagnosis not present

## 2015-01-18 DIAGNOSIS — R42 Dizziness and giddiness: Secondary | ICD-10-CM | POA: Diagnosis not present

## 2015-01-18 DIAGNOSIS — R200 Anesthesia of skin: Secondary | ICD-10-CM | POA: Diagnosis not present

## 2015-01-18 DIAGNOSIS — Z794 Long term (current) use of insulin: Secondary | ICD-10-CM | POA: Diagnosis not present

## 2015-01-18 DIAGNOSIS — Z833 Family history of diabetes mellitus: Secondary | ICD-10-CM | POA: Diagnosis not present

## 2015-01-21 DIAGNOSIS — N2581 Secondary hyperparathyroidism of renal origin: Secondary | ICD-10-CM | POA: Diagnosis not present

## 2015-01-21 DIAGNOSIS — Z992 Dependence on renal dialysis: Secondary | ICD-10-CM | POA: Diagnosis not present

## 2015-01-21 DIAGNOSIS — N186 End stage renal disease: Secondary | ICD-10-CM | POA: Diagnosis not present

## 2015-01-21 DIAGNOSIS — D509 Iron deficiency anemia, unspecified: Secondary | ICD-10-CM | POA: Diagnosis not present

## 2015-01-23 DIAGNOSIS — N186 End stage renal disease: Secondary | ICD-10-CM | POA: Diagnosis not present

## 2015-01-23 DIAGNOSIS — Z992 Dependence on renal dialysis: Secondary | ICD-10-CM | POA: Diagnosis not present

## 2015-01-23 DIAGNOSIS — D509 Iron deficiency anemia, unspecified: Secondary | ICD-10-CM | POA: Diagnosis not present

## 2015-01-23 DIAGNOSIS — N2581 Secondary hyperparathyroidism of renal origin: Secondary | ICD-10-CM | POA: Diagnosis not present

## 2015-01-23 MED ORDER — SODIUM CHLORIDE 0.9 % IJ SOLN
3.0000 mL | INTRAMUSCULAR | Status: DC | PRN
  Administered 2015-01-24: 3 mL via INTRAVENOUS
  Filled 2015-01-23: qty 3

## 2015-01-24 ENCOUNTER — Encounter (HOSPITAL_COMMUNITY)
Admission: RE | Disposition: A | Discharge status: Home / Self Care | Payer: Medicare Other | Source: Ambulatory Visit | Attending: Vascular Surgery

## 2015-01-24 ENCOUNTER — Encounter (HOSPITAL_COMMUNITY): Payer: Self-pay | Admitting: Vascular Surgery

## 2015-01-24 ENCOUNTER — Ambulatory Visit (HOSPITAL_COMMUNITY)
Admission: RE | Admit: 2015-01-24 | Discharge: 2015-01-24 | Disposition: A | Discharge status: Home / Self Care | Payer: Medicare Other | Source: Ambulatory Visit | Attending: Vascular Surgery | Admitting: Vascular Surgery

## 2015-01-24 ENCOUNTER — Other Ambulatory Visit: Payer: Self-pay | Admitting: *Deleted

## 2015-01-24 DIAGNOSIS — D631 Anemia in chronic kidney disease: Secondary | ICD-10-CM | POA: Diagnosis not present

## 2015-01-24 DIAGNOSIS — E1122 Type 2 diabetes mellitus with diabetic chronic kidney disease: Secondary | ICD-10-CM | POA: Diagnosis not present

## 2015-01-24 DIAGNOSIS — Z01810 Encounter for preprocedural cardiovascular examination: Secondary | ICD-10-CM

## 2015-01-24 DIAGNOSIS — I5022 Chronic systolic (congestive) heart failure: Secondary | ICD-10-CM | POA: Diagnosis not present

## 2015-01-24 DIAGNOSIS — Z4901 Encounter for fitting and adjustment of extracorporeal dialysis catheter: Secondary | ICD-10-CM | POA: Insufficient documentation

## 2015-01-24 DIAGNOSIS — T82898A Other specified complication of vascular prosthetic devices, implants and grafts, initial encounter: Secondary | ICD-10-CM | POA: Diagnosis not present

## 2015-01-24 DIAGNOSIS — Z880 Allergy status to penicillin: Secondary | ICD-10-CM | POA: Diagnosis not present

## 2015-01-24 DIAGNOSIS — M1990 Unspecified osteoarthritis, unspecified site: Secondary | ICD-10-CM | POA: Insufficient documentation

## 2015-01-24 DIAGNOSIS — Z992 Dependence on renal dialysis: Secondary | ICD-10-CM | POA: Diagnosis not present

## 2015-01-24 DIAGNOSIS — Z7982 Long term (current) use of aspirin: Secondary | ICD-10-CM | POA: Insufficient documentation

## 2015-01-24 DIAGNOSIS — N186 End stage renal disease: Secondary | ICD-10-CM | POA: Diagnosis not present

## 2015-01-24 DIAGNOSIS — K219 Gastro-esophageal reflux disease without esophagitis: Secondary | ICD-10-CM | POA: Diagnosis not present

## 2015-01-24 DIAGNOSIS — I120 Hypertensive chronic kidney disease with stage 5 chronic kidney disease or end stage renal disease: Secondary | ICD-10-CM | POA: Diagnosis not present

## 2015-01-24 DIAGNOSIS — Z794 Long term (current) use of insulin: Secondary | ICD-10-CM | POA: Diagnosis not present

## 2015-01-24 DIAGNOSIS — I428 Other cardiomyopathies: Secondary | ICD-10-CM | POA: Diagnosis not present

## 2015-01-24 HISTORY — PX: PERIPHERAL VASCULAR CATHETERIZATION: SHX172C

## 2015-01-24 LAB — POCT I-STAT, CHEM 8
BUN: 40 mg/dL — ABNORMAL HIGH (ref 6–20)
Calcium, Ion: 0.98 mmol/L — ABNORMAL LOW (ref 1.13–1.30)
Chloride: 102 mmol/L (ref 101–111)
Creatinine, Ser: 11.7 mg/dL — ABNORMAL HIGH (ref 0.61–1.24)
GLUCOSE: 119 mg/dL — AB (ref 65–99)
HCT: 44 % (ref 39.0–52.0)
Hemoglobin: 15 g/dL (ref 13.0–17.0)
Potassium: 3.9 mmol/L (ref 3.5–5.1)
Sodium: 141 mmol/L (ref 135–145)
TCO2: 27 mmol/L (ref 0–100)

## 2015-01-24 SURGERY — A/V SHUNTOGRAM/FISTULAGRAM
Anesthesia: LOCAL

## 2015-01-24 MED ORDER — SODIUM CHLORIDE 0.9 % IV SOLN: 250.0000 mL | INTRAVENOUS | Status: DC | PRN

## 2015-01-24 MED ORDER — SODIUM CHLORIDE 0.9 % IJ SOLN: 3.0000 mL | INTRAMUSCULAR | Status: DC | PRN

## 2015-01-24 MED ORDER — LIDOCAINE HCL (PF) 1 % IJ SOLN
INTRAMUSCULAR | Status: AC
  Filled 2015-01-24: qty 30

## 2015-01-24 MED ORDER — SODIUM CHLORIDE 0.9 % IJ SOLN: 3.0000 mL | Freq: Two times a day (BID) | INTRAMUSCULAR | Status: DC

## 2015-01-24 MED ORDER — ACETAMINOPHEN 325 MG PO TABS
650.0000 mg | ORAL_TABLET | ORAL | Status: DC | PRN
Start: 2015-01-24 — End: 2015-01-24

## 2015-01-24 MED ORDER — LIDOCAINE HCL (PF) 1 % IJ SOLN
INTRAMUSCULAR | Status: DC | PRN
  Administered 2015-01-24: 5 mL

## 2015-01-24 MED ORDER — IODIXANOL 320 MG/ML IV SOLN
INTRAVENOUS | Status: DC | PRN
  Administered 2015-01-24: 50 mL via INTRAVENOUS

## 2015-01-24 MED ORDER — HEPARIN (PORCINE) IN NACL 2-0.9 UNIT/ML-% IJ SOLN
INTRAMUSCULAR | Status: AC
  Filled 2015-01-24: qty 500

## 2015-01-24 SURGICAL SUPPLY — 9 items
COVER DOME SNAP 22 D (MISCELLANEOUS) ×1 IMPLANT
COVER PRB 48X5XTLSCP FOLD TPE (BAG) IMPLANT
COVER PROBE 5X48 (BAG) ×2
KIT MICROINTRODUCER STIFF 5F (SHEATH) ×1 IMPLANT
PROTECTION STATION PRESSURIZED (MISCELLANEOUS) ×2
STATION PROTECTION PRESSURIZED (MISCELLANEOUS) IMPLANT
STOPCOCK MORSE 400PSI 3WAY (MISCELLANEOUS) ×1 IMPLANT
TRAY PV CATH (CUSTOM PROCEDURE TRAY) ×2 IMPLANT
TUBING CIL FLEX 10 FLL-RA (TUBING) ×1 IMPLANT

## 2015-01-24 NOTE — Discharge Instructions (Signed)
Fistulogram, Care After °Refer to this sheet in the next few weeks. These instructions provide you with information on caring for yourself after your procedure. Your health care provider may also give you more specific instructions. Your treatment has been planned according to current medical practices, but problems sometimes occur. Call your health care provider if you have any problems or questions after your procedure. °WHAT TO EXPECT AFTER THE PROCEDURE °After your procedure, it is typical to have the following: °· A small amount of discomfort in the area where the catheters were placed. °· A small amount of bruising around the fistula. °· Sleepiness and fatigue. °HOME CARE INSTRUCTIONS °· Rest at home for the day following your procedure. °· Do not drive or operate heavy machinery while taking pain medicine. °· Take medicines only as directed by your health care provider. °· Do not take baths, swim, or use a hot tub until your health care provider approves. You may shower 24 hours after the procedure or as directed by your health care provider. °· There are many different ways to close and cover an incision, including stitches, skin glue, and adhesive strips. Follow your health care provider's instructions on: °¨ Incision care. °¨ Bandage (dressing) changes and removal. °¨ Incision closure removal. °· Monitor your dialysis fistula carefully. °SEEK MEDICAL CARE IF: °· You have drainage, redness, swelling, or pain at your catheter site. °· You have a fever. °· You have chills. °SEEK IMMEDIATE MEDICAL CARE IF: °· You feel weak. °· You have trouble balancing. °· You have trouble moving your arms or legs. °· You have problems with your speech or vision. °· You can no longer feel a vibration or buzz when you put your fingers over your dialysis fistula. °· The limb that was used for the procedure: °¨ Swells. °¨ Is painful. °¨ Is cold. °¨ Is discolored, such as blue or pale white. °Document Released: 12/04/2013  Document Reviewed: 09/08/2013 °Exit[employer]® Patient Information ©2015 [employer]. This information is not intended to replace advice given to you by your health care provider. Make sure you discuss any questions you have with your health care provider. ° °

## 2015-01-24 NOTE — H&P (Signed)
Brief History and Physical  History of Present Illness  Jacob [name] is a 64 y.o. male who presents with chief complaint: right RC AVF flow rate issues.  Reportedly the HD run starts to fail toward the end of the runs.  Pt also notes some numbness toward the end of each HD run.  The patient presents today for R arm fistulogram, possible intervention.    Past Medical History  Diagnosis Date  . Nonischemic cardiomyopathy     Hypertension and regular alcohol use, LVEF 40-45%  . End stage renal disease     Hemodialysis on Monday, Wednesday, Friday  . Chronic systolic heart failure   . Anemia in chronic kidney disease(285.21)   . Type 2 diabetes mellitus   . Essential hypertension, benign   . Glaucoma   . GERD (gastroesophageal reflux disease)   . Arthritis     Past Surgical History  Procedure Laterality Date  . Lumbar spine surgery    . Colonoscopy      Hx: of  . Av fistula placement      Hx: of  . Ligation of arteriovenous  fistula Left 05/04/2013    Procedure: LIGATION OF LEFT RADIAL CEPHALIC ARTERIOVENOUS  FISTULA;  Surgeon: Conrad Oval, MD;  Location: [place];  Service: Vascular;  Laterality: Left;  Ultrasound guided  . Insertion of dialysis catheter Right 05/04/2013    Procedure: INSERTION OF DIALYSIS CATHETER;  Surgeon: Conrad Lavina, MD;  Location: [place];  Service: Vascular;  Laterality: Right;  Ultrasound guided  . Fistulogram N/A 05/04/2013    Procedure: CENTRAL VENOGRAM;  Surgeon: Conrad Greenhorn, MD;  Location: [place];  Service: Vascular;  Laterality: N/A;  . Av fistula placement Right 05/04/2013    Procedure: ARTERIOVENOUS (AV) FISTULA CREATION- RIGHT ARM;  Surgeon: Conrad Metairie, MD;  Location: [place];  Service: Vascular;  Laterality: Right;  Ultrasound guided  . Revison of arteriovenous fistula Right 01/02/2014    Procedure: REVISON OF ARTERIOVENOUS FISTULA ANASTOMOSIS;  Surgeon: Conrad Benton, MD;  Location: [place];  Service: Vascular;  Laterality: Right;  . Shuntogram N/A  11/07/2013    Procedure: Fistulogram;  Surgeon: [name], MD;  Location: [facility] CATH LAB;  Service: Cardiovascular;  Laterality: N/A;  . Cataract extraction w/phaco Left 07/16/2014    Procedure: CATARACT EXTRACTION PHACO AND INTRAOCULAR LENS PLACEMENT (IOC);  Surgeon: [name], MD;  Location: [facility];  Service: Ophthalmology;  Laterality: Left;  CDE:8.86  . Cataract extraction w/phaco Right 07/30/2014    Procedure: CATARACT EXTRACTION PHACO AND INTRAOCULAR LENS PLACEMENT (IOC);  Surgeon: [name], MD;  Location: [facility];  Service: Ophthalmology;  Laterality: Right;  CDE 8.99    History   Social History  . Marital Status: Single    Spouse Name: N/A  . Number of Children: N/A  . Years of Education: N/A   Occupational History  . Not on file.   Social History Main Topics  . Smoking status: Never Smoker   . Smokeless tobacco: Never Used  . Alcohol Use: 1.8 oz/week    3 Shots of liquor per week     Comment: Regular use for years, reports at least a pint per week  . Drug Use: No  . Sexual Activity: Yes    Birth Control/ Protection: None   Other Topics Concern  . Not on file   Social History Narrative    Family History  Problem Relation Age of Onset  . Heart attack  Brother     55  . Heart disease Brother     before age 38  . Hypertension Brother   . Heart attack Brother     89  . Heart attack Brother     97  . Diabetes Mother   . Hypertension Mother   . Heart disease Father   . Hypertension Father   . Other Father     amputation  . Hypertension Sister   . Vision loss Maternal Uncle     No current facility-administered medications on file prior to encounter.   Current Outpatient Prescriptions on File Prior to Encounter  Medication Sig Dispense Refill  . acetaminophen (TYLENOL) 500 MG tablet Take 1,000 mg by mouth every 6 (six) hours as needed for mild pain.    [name] aspirin 325 MG tablet Take 325 mg by mouth daily.      . B Complex-C-Folic Acid (DIALYVITE TABLET)  TABS Take 1 tablet by mouth every other day.     . carvedilol (COREG) 12.5 MG tablet Take 12.5 mg by mouth every morning.     [name] EPIPEN 2-PAK 0.3 MG/0.3ML SOAJ injection Inject 0.3 mg into the muscle once as needed (for bee).     . insulin glargine (LANTUS) 100 UNIT/ML injection Inject 30 Units into the skin at bedtime.     [name] omeprazole (PRILOSEC) 40 MG capsule Take 40 mg by mouth daily.      [name] RENVELA 800 MG tablet Take 800 mg by mouth 3 (three) times daily with meals.     . SENSIPAR 30 MG tablet Take 30 mg by mouth every evening. At dinnertime      Allergies  Allergen Reactions  . Bee Venom Anaphylaxis  . Penicillins Swelling    Swelling of throat and tongue    Review of Systems: As listed above, otherwise negative.  Physical Examination  Filed Vitals:   01/24/15 1028  BP: 166/85  Pulse: 78  Temp: 98.1 F (36.7 C)  TempSrc: Oral  Resp: 18  Height: 6\' 2"  (1.88 m)  Weight: 285 lb (129.275 kg)  SpO2: 98%    General: A&O x 3, WDWN  Pulmonary: Sym exp, good air movt, CTAB, no rales, rhonchi, & wheezing  Cardiac: RRR, Nl S1, S2, no Murmurs, rubs or gallops  Gastrointestinal: soft, NTND, -G/R, - HSM, - masses, - CVAT B  Musculoskeletal: M/S 5/5 throughout , Extremities without ischemic changes , +bruit, +thrill  Laboratory See iStat  Medical Decision Making  Jacob [name] is a 64 y.o. male who presents with: possible steal from [facility] AVF, possible venous stenosis   The patient is scheduled for: R arm fistulogram, possible intervention  I discussed with the patient the nature of angiographic procedures, especially the limited patencies of any endovascular intervention.  The patient is aware of that the risks of an angiographic procedure include but are not limited to: bleeding, infection, access site complications, renal failure, embolization, rupture of vessel, dissection, possible need for emergent surgical intervention, possible need for surgical procedures to treat the  patient's pathology, and stroke and death.    The patient is aware of the risks and agrees to proceed.  [name], MD [facility] Office: [phone] Pager: [phone]  01/24/2015, 11:32 AM

## 2015-01-25 ENCOUNTER — Telehealth: Payer: Self-pay | Admitting: Vascular Surgery

## 2015-01-25 DIAGNOSIS — D509 Iron deficiency anemia, unspecified: Secondary | ICD-10-CM | POA: Diagnosis not present

## 2015-01-25 DIAGNOSIS — N2581 Secondary hyperparathyroidism of renal origin: Secondary | ICD-10-CM | POA: Diagnosis not present

## 2015-01-25 DIAGNOSIS — Z992 Dependence on renal dialysis: Secondary | ICD-10-CM | POA: Diagnosis not present

## 2015-01-25 DIAGNOSIS — N186 End stage renal disease: Secondary | ICD-10-CM | POA: Diagnosis not present

## 2015-01-25 MED FILL — Heparin Sodium (Porcine) 2 Unit/ML in Sodium Chloride 0.9%: INTRAMUSCULAR | Qty: 500 | Status: AC

## 2015-01-25 NOTE — Telephone Encounter (Signed)
-----   Message from Mena Goes, RN sent at 01/24/2015  4:06 PM EDT ----- Regarding: Schedule   ----- Message -----    From: Conrad Guayabal, MD    Sent: 01/24/2015   2:04 PM      To: [address]  GILVERTO ROOKSTOOL [id number] November 25, 1950  Procedure:  1.  R RC AVF cannulation 2.  R arm fistulogram  Follow-up: 2-4 weeks  Orders(s) for follow-up: RUE arterial duplex

## 2015-01-25 NOTE — Telephone Encounter (Signed)
Spoke with pt, dpm °

## 2015-01-28 DIAGNOSIS — N2581 Secondary hyperparathyroidism of renal origin: Secondary | ICD-10-CM | POA: Diagnosis not present

## 2015-01-28 DIAGNOSIS — Z794 Long term (current) use of insulin: Secondary | ICD-10-CM | POA: Diagnosis not present

## 2015-01-28 DIAGNOSIS — D509 Iron deficiency anemia, unspecified: Secondary | ICD-10-CM | POA: Diagnosis not present

## 2015-01-28 DIAGNOSIS — Z992 Dependence on renal dialysis: Secondary | ICD-10-CM | POA: Diagnosis not present

## 2015-01-28 DIAGNOSIS — E119 Type 2 diabetes mellitus without complications: Secondary | ICD-10-CM | POA: Diagnosis not present

## 2015-01-28 DIAGNOSIS — N186 End stage renal disease: Secondary | ICD-10-CM | POA: Diagnosis not present

## 2015-01-30 DIAGNOSIS — N2581 Secondary hyperparathyroidism of renal origin: Secondary | ICD-10-CM | POA: Diagnosis not present

## 2015-01-30 DIAGNOSIS — Z992 Dependence on renal dialysis: Secondary | ICD-10-CM | POA: Diagnosis not present

## 2015-01-30 DIAGNOSIS — N186 End stage renal disease: Secondary | ICD-10-CM | POA: Diagnosis not present

## 2015-01-30 DIAGNOSIS — D509 Iron deficiency anemia, unspecified: Secondary | ICD-10-CM | POA: Diagnosis not present

## 2015-01-31 DIAGNOSIS — Z992 Dependence on renal dialysis: Secondary | ICD-10-CM | POA: Diagnosis not present

## 2015-01-31 DIAGNOSIS — N186 End stage renal disease: Secondary | ICD-10-CM | POA: Diagnosis not present

## 2015-02-01 DIAGNOSIS — N186 End stage renal disease: Secondary | ICD-10-CM | POA: Diagnosis not present

## 2015-02-01 DIAGNOSIS — Z992 Dependence on renal dialysis: Secondary | ICD-10-CM | POA: Diagnosis not present

## 2015-02-01 DIAGNOSIS — N2581 Secondary hyperparathyroidism of renal origin: Secondary | ICD-10-CM | POA: Diagnosis not present

## 2015-02-01 DIAGNOSIS — D509 Iron deficiency anemia, unspecified: Secondary | ICD-10-CM | POA: Diagnosis not present

## 2015-02-04 DIAGNOSIS — N2581 Secondary hyperparathyroidism of renal origin: Secondary | ICD-10-CM | POA: Diagnosis not present

## 2015-02-04 DIAGNOSIS — Z992 Dependence on renal dialysis: Secondary | ICD-10-CM | POA: Diagnosis not present

## 2015-02-04 DIAGNOSIS — D509 Iron deficiency anemia, unspecified: Secondary | ICD-10-CM | POA: Diagnosis not present

## 2015-02-04 DIAGNOSIS — N186 End stage renal disease: Secondary | ICD-10-CM | POA: Diagnosis not present

## 2015-02-05 ENCOUNTER — Ambulatory Visit (INDEPENDENT_AMBULATORY_CARE_PROVIDER_SITE_OTHER): Payer: Medicare Other | Admitting: Cardiology

## 2015-02-05 ENCOUNTER — Encounter: Payer: Self-pay | Admitting: *Deleted

## 2015-02-05 ENCOUNTER — Encounter: Payer: Self-pay | Admitting: Cardiology

## 2015-02-05 ENCOUNTER — Encounter: Payer: Self-pay | Admitting: Vascular Surgery

## 2015-02-05 VITALS — BP 140/70 | HR 71 | Ht 74.0 in | Wt 299.0 lb

## 2015-02-05 DIAGNOSIS — N186 End stage renal disease: Secondary | ICD-10-CM

## 2015-02-05 DIAGNOSIS — Z992 Dependence on renal dialysis: Secondary | ICD-10-CM | POA: Diagnosis not present

## 2015-02-05 DIAGNOSIS — I429 Cardiomyopathy, unspecified: Secondary | ICD-10-CM | POA: Diagnosis not present

## 2015-02-05 DIAGNOSIS — I10 Essential (primary) hypertension: Secondary | ICD-10-CM

## 2015-02-05 DIAGNOSIS — I428 Other cardiomyopathies: Secondary | ICD-10-CM

## 2015-02-05 NOTE — Progress Notes (Signed)
Cardiology Office Note  Date: 02/05/2015   ID: Jacob [name], DOB [birth year], MRN [id number]  PCP: Purvis Kilts, MD  Primary Cardiologist: Rozann Lesches, MD   Chief Complaint  Patient presents with  . Cardiomyopathy  . Hypertension    History of Present Illness: Jacob [name] is a 64 y.o. male last seen in November 2015. He presents for a routine follow-up visit. Continues on hemodialysis and has follow-up with Dr. Lowanda Foster. Records reviewed, he has had some recent problems with flow rates using his right arm AV fistula at dialysis and was evaluated by Dr. Bridgett Larsson. Further testing is planned. Patient states that his right arm feels numb, especially when he is on hemodialysis.  He has had no chest pain symptoms, reports stable NYHA class II dyspnea. His last cardiac evaluation from 2014 is outlined below. LVEF had normalized as of that time on medical therapy.  We reviewed his medications today, he reports compliance.   Past Medical History  Diagnosis Date  . Nonischemic cardiomyopathy     Hypertension and regular alcohol use, LVEF 40-45%  . End stage renal disease     Hemodialysis on Monday, Wednesday, Friday  . Chronic systolic heart failure   . Anemia in chronic kidney disease(285.21)   . Type 2 diabetes mellitus   . Essential hypertension, benign   . Glaucoma   . GERD (gastroesophageal reflux disease)   . Arthritis     Current Outpatient Prescriptions  Medication Sig Dispense Refill  . acetaminophen (TYLENOL) 500 MG tablet Take 1,000 mg by mouth every 6 (six) hours as needed for mild pain.    [name] aspirin 325 MG tablet Take 325 mg by mouth daily.      . B Complex-C-Folic Acid (DIALYVITE TABLET) TABS Take 1 tablet by mouth every other day.     . carvedilol (COREG) 12.5 MG tablet Take 12.5 mg by mouth every morning.     [name] EPIPEN 2-PAK 0.3 MG/0.3ML SOAJ injection Inject 0.3 mg into the muscle once as needed (for bee).     . insulin glargine (LANTUS) 100 UNIT/ML  injection Inject 30 Units into the skin at bedtime.     . meclizine (ANTIVERT) 25 MG tablet Take 25 mg by mouth 3 (three) times daily as needed for dizziness.    [name] omeprazole (PRILOSEC) 40 MG capsule Take 40 mg by mouth daily.      . ramipril (ALTACE) 2.5 MG capsule Take 2.5 mg by mouth daily.    [name] RENVELA 800 MG tablet Take 800 mg by mouth 3 (three) times daily with meals.     . SENSIPAR 30 MG tablet Take 30 mg by mouth every evening. At dinnertime     No current facility-administered medications for this visit.    Allergies:  Bee venom and Penicillins   Social History: The patient  reports that he has never smoked. He has never used smokeless tobacco. He reports that he drinks about 1.8 oz of alcohol per week. He reports that he does not use illicit drugs.   ROS:  Please see the history of present illness. Otherwise, complete review of systems is positive for intermittent fatigue, no claudication.  All other systems are reviewed and negative.   Physical Exam: VS:  BP 140/70 mmHg  Pulse 71  Ht 6\' 2"  (1.88 m)  Wt 299 lb (135.626 kg)  BMI 38.37 kg/m2  SpO2 92%, BMI Body mass index is 38.37 kg/(m^2).  Wt Readings from Last 3 Encounters:  02/05/15 299 lb (135.626 kg)  01/24/15 285 lb (129.275 kg)  07/13/14 301 lb (136.533 kg)     Obese male in no acute distress.  HEENT: Conjunctiva and lids normal, oropharynx with poor dentition.  Neck: Supple, no elevated JVP, possible left carotid bruit. Lungs: Decreased but clear overall, nonlabored.  Cardiac: Indistinct PMI, regular rate and rhythm, no S3, soft systolic murmur.  Abdomen: Nontender, bowel sounds present, no hepatomegaly.  Musculoskeletal: No kyphosis.  Extremities: No distal pitting edema. AV fistula noted in the left forearm without thrill, right arm fistula active.    ECG: ECG is not ordered today.   Recent Labwork: 01/24/2015: BUN 40*; Creatinine, Ser 11.70*; Hemoglobin 15.0; Potassium 3.9; Sodium 141   Other  Studies Reviewed Today:  Echocardiogram in April 2014 revealed moderate LVH with LVEF 0000000, grade 1 diastolic dysfunction, trivial mitral regurgitation, mild left atrial enlargement.   Lexiscan Cardiolite in April 2014 showed no diagnostic ST segment changes, overall low risk perfusion study with small, partially reversible lateral apical defect possibly reflecting variable soft tissue attenuation, less likely a small region of ischemia. LVEF was 53% by that study.  Assessment and Plan:  1. History of nonischemic cardiomyopathy. He continues on Coreg and ramipril. Last assessment of LVEF was in 2014, follow-up echocardiogram will be obtained.  2. Essential hypertension, blood pressure trend has been better. No changes made today.  3. End-stage atrial disease on hemodialysis. Keep follow-up with Dr. Lowanda Foster.  Current medicines were reviewed with the patient today.   Orders Placed This Encounter  Procedures  . ECHOCARDIOGRAM COMPLETE    Disposition: FU with me in 6 months.   Signed, Satira Sark, MD, [facility] 02/05/2015 8:53 AM    [facility] [place], [place], [place] [address] Phone: [phone]; Fax: [phone]

## 2015-02-05 NOTE — Patient Instructions (Signed)
Your physician recommends that you continue on your current medications as directed. Please refer to the Current Medication list given to you today. Your physician has requested that you have an echocardiogram. Echocardiography is a painless test that uses sound waves to create images of your heart. It provides your doctor with information about the size and shape of your heart and how well your heart's chambers and valves are working. This procedure takes approximately one hour. There are no restrictions for this procedure. Your physician recommends that you schedule a follow-up appointment in: 6 months. You will receive a reminder letter in the mail in about 4 months reminding you to call and schedule your appointment. If you don't receive this letter, please contact our office. 

## 2015-02-06 DIAGNOSIS — N186 End stage renal disease: Secondary | ICD-10-CM | POA: Diagnosis not present

## 2015-02-06 DIAGNOSIS — D509 Iron deficiency anemia, unspecified: Secondary | ICD-10-CM | POA: Diagnosis not present

## 2015-02-06 DIAGNOSIS — N2581 Secondary hyperparathyroidism of renal origin: Secondary | ICD-10-CM | POA: Diagnosis not present

## 2015-02-06 DIAGNOSIS — Z992 Dependence on renal dialysis: Secondary | ICD-10-CM | POA: Diagnosis not present

## 2015-02-08 ENCOUNTER — Ambulatory Visit (HOSPITAL_COMMUNITY)
Admission: RE | Admit: 2015-02-08 | Discharge: 2015-02-08 | Disposition: A | Discharge status: Home / Self Care | Payer: Medicare Other | Source: Ambulatory Visit | Attending: Vascular Surgery | Admitting: Vascular Surgery

## 2015-02-08 ENCOUNTER — Encounter: Payer: Self-pay | Admitting: Vascular Surgery

## 2015-02-08 ENCOUNTER — Ambulatory Visit (INDEPENDENT_AMBULATORY_CARE_PROVIDER_SITE_OTHER): Payer: Medicare Other | Admitting: Vascular Surgery

## 2015-02-08 VITALS — BP 144/86 | HR 84 | Temp 98.2°F | Resp 16 | Ht 74.0 in | Wt 298.0 lb

## 2015-02-08 DIAGNOSIS — D509 Iron deficiency anemia, unspecified: Secondary | ICD-10-CM | POA: Diagnosis not present

## 2015-02-08 DIAGNOSIS — Z01810 Encounter for preprocedural cardiovascular examination: Secondary | ICD-10-CM | POA: Insufficient documentation

## 2015-02-08 DIAGNOSIS — N186 End stage renal disease: Secondary | ICD-10-CM

## 2015-02-08 DIAGNOSIS — I70208 Unspecified atherosclerosis of native arteries of extremities, other extremity: Secondary | ICD-10-CM | POA: Diagnosis not present

## 2015-02-08 DIAGNOSIS — N2581 Secondary hyperparathyroidism of renal origin: Secondary | ICD-10-CM | POA: Diagnosis not present

## 2015-02-08 DIAGNOSIS — Z992 Dependence on renal dialysis: Secondary | ICD-10-CM

## 2015-02-08 MED ORDER — HYDROCODONE-ACETAMINOPHEN 5-325 MG PO TABS: 1.0000 | ORAL_TABLET | Freq: Four times a day (QID) | ORAL | Status: DC | PRN

## 2015-02-08 NOTE — Progress Notes (Signed)
Established Dialysis Access  History of Present Illness  Jacob [name] is a 64 y.o. (January 11, 1951) male who presents for re-evaluation of R RC AVF.  The patient is right hand dominant.  Previous access procedures have been completed in the both arms.  The patient's complication from previous access procedures include: thrombosis.  His R RC AVF was completed recently which demonstrated patent R RC AVF with tapering at the distal end.  I felt some the distal stenosis was related to local anesthetic injection with lidocaine with epinephrine and cannulation related spasm.  Patient is have Right arm and back pain.  Pt has known history of HNP.  He also notes whole R arm numbness and some pain running down his arm.  The patient's PMH, PSH, SH, and FamHx are unchanged from 01/25/15.  Current Outpatient Prescriptions  Medication Sig Dispense Refill  . acetaminophen (TYLENOL) 500 MG tablet Take 1,000 mg by mouth every 6 (six) hours as needed for mild pain.    [name] aspirin 325 MG tablet Take 325 mg by mouth daily.      . B Complex-C-Folic Acid (DIALYVITE TABLET) TABS Take 1 tablet by mouth every other day.     . carvedilol (COREG) 12.5 MG tablet Take 12.5 mg by mouth every morning.     [name] EPIPEN 2-PAK 0.3 MG/0.3ML SOAJ injection Inject 0.3 mg into the muscle once as needed (for bee).     . insulin glargine (LANTUS) 100 UNIT/ML injection Inject 30 Units into the skin at bedtime.     . meclizine (ANTIVERT) 25 MG tablet Take 25 mg by mouth 3 (three) times daily as needed for dizziness.    [name] omeprazole (PRILOSEC) 40 MG capsule Take 40 mg by mouth daily.      . ramipril (ALTACE) 2.5 MG capsule Take 2.5 mg by mouth daily.    [name] RENVELA 800 MG tablet Take 800 mg by mouth 3 (three) times daily with meals.     . SENSIPAR 30 MG tablet Take 30 mg by mouth every evening. At dinnertime    . B-D ULTRAFINE III SHORT PEN 31G X 8 MM MISC     . HYDROcodone-acetaminophen (NORCO/VICODIN) 5-325 MG per tablet Take 1 tablet by  mouth every 6 (six) hours as needed for moderate pain. 15 tablet 0  . LANTUS SOLOSTAR 100 UNIT/ML Solostar Pen     . ramipril (ALTACE) 10 MG capsule      No current facility-administered medications for this visit.    Allergies  Allergen Reactions  . Bee Venom Anaphylaxis  . Penicillins Swelling    Swelling of throat and tongue    On ROS today: no steal syndrome, R arm numbness   Physical Examination  Filed Vitals:   02/08/15 1544  BP: 144/86  Pulse: 84  Temp: 98.2 F (36.8 C)  TempSrc: Oral  Resp: 16  Height: 6\' 2"  (1.88 m)  Weight: 298 lb (135.172 kg)  SpO2: 99%   Body mass index is 38.24 kg/(m^2).  General: A&O x 3, WD, obese  Pulmonary: Sym exp, good air movt, CTAB, no rales, rhonchi, & wheezing  Cardiac: RRR, Nl S1, S2, no Murmurs, rubs or gallops  Vascular: Vessel Right Left  Radial Palpable Palpable  Ulnar Not Palpable Not Palpable  Brachial Palpable Palpable   Gastrointestinal: soft, NTND, no G/R, no HSM, no masses, no CVAT B  Musculoskeletal: M/S 5/5 throughout , Extremities without  ischemic changes , palpable thrill in access in R forearm, + bruit  in access  Neurologic: Pain and light touch intact in extremities , Motor exam as listed above  Non-Invasive Vascular Imaging  R arterial doppler (02/08/2015)  Brachial: tri  Radial: tri, calc limit imaging  Ulnar: tri, >50% stenosis based on PSV 404 c/s   Medical Decision Making  Jacob [name] is a 64 y.o. male who presents with ESRD requiring hemodialysis, possible cervical nerve root compression.   No obvious proximal arterial disease with distal radial and ulnar disease evident, so no easy way to augment flow in fistula.  If patient continues to have flow rate issues, could consider patch angioplasty vs venoplasty of distal fistula.    The problem is this could create a steal circumstance, given the ulnar artery disease this patient has.  In regards to his R arm pain and numbness, his  distribution of sx is NOT consistent with steal syndrome, rather the character and distribution of sx is more consistent with possible cervical nerve root compression  I gave the patient a few Vicodin 5/325 mg 1 po qd #15 no refills to help him get into his PCP for additional evaluation.   [name], MD [facility] Office: [phone] Pager: [phone]  02/08/2015, 4:57 PM

## 2015-02-11 DIAGNOSIS — N186 End stage renal disease: Secondary | ICD-10-CM | POA: Diagnosis not present

## 2015-02-11 DIAGNOSIS — N2581 Secondary hyperparathyroidism of renal origin: Secondary | ICD-10-CM | POA: Diagnosis not present

## 2015-02-11 DIAGNOSIS — D509 Iron deficiency anemia, unspecified: Secondary | ICD-10-CM | POA: Diagnosis not present

## 2015-02-11 DIAGNOSIS — Z992 Dependence on renal dialysis: Secondary | ICD-10-CM | POA: Diagnosis not present

## 2015-02-13 DIAGNOSIS — D509 Iron deficiency anemia, unspecified: Secondary | ICD-10-CM | POA: Diagnosis not present

## 2015-02-13 DIAGNOSIS — N186 End stage renal disease: Secondary | ICD-10-CM | POA: Diagnosis not present

## 2015-02-13 DIAGNOSIS — Z992 Dependence on renal dialysis: Secondary | ICD-10-CM | POA: Diagnosis not present

## 2015-02-13 DIAGNOSIS — N2581 Secondary hyperparathyroidism of renal origin: Secondary | ICD-10-CM | POA: Diagnosis not present

## 2015-02-15 DIAGNOSIS — D509 Iron deficiency anemia, unspecified: Secondary | ICD-10-CM | POA: Diagnosis not present

## 2015-02-15 DIAGNOSIS — N2581 Secondary hyperparathyroidism of renal origin: Secondary | ICD-10-CM | POA: Diagnosis not present

## 2015-02-15 DIAGNOSIS — N186 End stage renal disease: Secondary | ICD-10-CM | POA: Diagnosis not present

## 2015-02-15 DIAGNOSIS — Z992 Dependence on renal dialysis: Secondary | ICD-10-CM | POA: Diagnosis not present

## 2015-02-18 DIAGNOSIS — Z992 Dependence on renal dialysis: Secondary | ICD-10-CM | POA: Diagnosis not present

## 2015-02-18 DIAGNOSIS — D509 Iron deficiency anemia, unspecified: Secondary | ICD-10-CM | POA: Diagnosis not present

## 2015-02-18 DIAGNOSIS — N186 End stage renal disease: Secondary | ICD-10-CM | POA: Diagnosis not present

## 2015-02-18 DIAGNOSIS — N2581 Secondary hyperparathyroidism of renal origin: Secondary | ICD-10-CM | POA: Diagnosis not present

## 2015-02-19 DIAGNOSIS — E782 Mixed hyperlipidemia: Secondary | ICD-10-CM | POA: Diagnosis not present

## 2015-02-19 DIAGNOSIS — I10 Essential (primary) hypertension: Secondary | ICD-10-CM | POA: Diagnosis not present

## 2015-02-19 DIAGNOSIS — Z992 Dependence on renal dialysis: Secondary | ICD-10-CM | POA: Diagnosis not present

## 2015-02-19 DIAGNOSIS — Z1389 Encounter for screening for other disorder: Secondary | ICD-10-CM | POA: Diagnosis not present

## 2015-02-19 DIAGNOSIS — Z6838 Body mass index (BMI) 38.0-38.9, adult: Secondary | ICD-10-CM | POA: Diagnosis not present

## 2015-02-19 DIAGNOSIS — E1129 Type 2 diabetes mellitus with other diabetic kidney complication: Secondary | ICD-10-CM | POA: Diagnosis not present

## 2015-02-20 DIAGNOSIS — D509 Iron deficiency anemia, unspecified: Secondary | ICD-10-CM | POA: Diagnosis not present

## 2015-02-20 DIAGNOSIS — Z992 Dependence on renal dialysis: Secondary | ICD-10-CM | POA: Diagnosis not present

## 2015-02-20 DIAGNOSIS — N2581 Secondary hyperparathyroidism of renal origin: Secondary | ICD-10-CM | POA: Diagnosis not present

## 2015-02-20 DIAGNOSIS — N186 End stage renal disease: Secondary | ICD-10-CM | POA: Diagnosis not present

## 2015-02-22 DIAGNOSIS — D509 Iron deficiency anemia, unspecified: Secondary | ICD-10-CM | POA: Diagnosis not present

## 2015-02-22 DIAGNOSIS — N186 End stage renal disease: Secondary | ICD-10-CM | POA: Diagnosis not present

## 2015-02-22 DIAGNOSIS — Z992 Dependence on renal dialysis: Secondary | ICD-10-CM | POA: Diagnosis not present

## 2015-02-22 DIAGNOSIS — N2581 Secondary hyperparathyroidism of renal origin: Secondary | ICD-10-CM | POA: Diagnosis not present

## 2015-02-25 DIAGNOSIS — Z992 Dependence on renal dialysis: Secondary | ICD-10-CM | POA: Diagnosis not present

## 2015-02-25 DIAGNOSIS — D509 Iron deficiency anemia, unspecified: Secondary | ICD-10-CM | POA: Diagnosis not present

## 2015-02-25 DIAGNOSIS — N186 End stage renal disease: Secondary | ICD-10-CM | POA: Diagnosis not present

## 2015-02-25 DIAGNOSIS — N2581 Secondary hyperparathyroidism of renal origin: Secondary | ICD-10-CM | POA: Diagnosis not present

## 2015-02-26 DIAGNOSIS — I10 Essential (primary) hypertension: Secondary | ICD-10-CM | POA: Diagnosis not present

## 2015-02-26 DIAGNOSIS — Z6838 Body mass index (BMI) 38.0-38.9, adult: Secondary | ICD-10-CM | POA: Diagnosis not present

## 2015-02-26 DIAGNOSIS — N185 Chronic kidney disease, stage 5: Secondary | ICD-10-CM | POA: Diagnosis not present

## 2015-02-26 DIAGNOSIS — E1129 Type 2 diabetes mellitus with other diabetic kidney complication: Secondary | ICD-10-CM | POA: Diagnosis not present

## 2015-02-26 DIAGNOSIS — E782 Mixed hyperlipidemia: Secondary | ICD-10-CM | POA: Diagnosis not present

## 2015-02-26 DIAGNOSIS — Z1389 Encounter for screening for other disorder: Secondary | ICD-10-CM | POA: Diagnosis not present

## 2015-02-27 DIAGNOSIS — D509 Iron deficiency anemia, unspecified: Secondary | ICD-10-CM | POA: Diagnosis not present

## 2015-02-27 DIAGNOSIS — N2581 Secondary hyperparathyroidism of renal origin: Secondary | ICD-10-CM | POA: Diagnosis not present

## 2015-02-27 DIAGNOSIS — N186 End stage renal disease: Secondary | ICD-10-CM | POA: Diagnosis not present

## 2015-02-27 DIAGNOSIS — Z992 Dependence on renal dialysis: Secondary | ICD-10-CM | POA: Diagnosis not present

## 2015-02-28 ENCOUNTER — Other Ambulatory Visit: Payer: Self-pay

## 2015-02-28 ENCOUNTER — Ambulatory Visit (INDEPENDENT_AMBULATORY_CARE_PROVIDER_SITE_OTHER): Payer: Medicare Other

## 2015-02-28 DIAGNOSIS — I428 Other cardiomyopathies: Secondary | ICD-10-CM

## 2015-02-28 DIAGNOSIS — I10 Essential (primary) hypertension: Secondary | ICD-10-CM | POA: Diagnosis not present

## 2015-02-28 DIAGNOSIS — I429 Cardiomyopathy, unspecified: Secondary | ICD-10-CM | POA: Diagnosis not present

## 2015-03-01 ENCOUNTER — Telehealth: Payer: Self-pay | Admitting: *Deleted

## 2015-03-01 DIAGNOSIS — I428 Other cardiomyopathies: Secondary | ICD-10-CM

## 2015-03-01 DIAGNOSIS — R931 Abnormal findings on diagnostic imaging of heart and coronary circulation: Secondary | ICD-10-CM

## 2015-03-01 DIAGNOSIS — Z992 Dependence on renal dialysis: Secondary | ICD-10-CM | POA: Diagnosis not present

## 2015-03-01 DIAGNOSIS — N2581 Secondary hyperparathyroidism of renal origin: Secondary | ICD-10-CM | POA: Diagnosis not present

## 2015-03-01 DIAGNOSIS — N186 End stage renal disease: Secondary | ICD-10-CM | POA: Diagnosis not present

## 2015-03-01 DIAGNOSIS — D509 Iron deficiency anemia, unspecified: Secondary | ICD-10-CM | POA: Diagnosis not present

## 2015-03-01 NOTE — Telephone Encounter (Signed)
-----   Message from Satira Sark, MD sent at 03/01/2015  4:22 PM EDT ----- Reviewed report. Study was inadequate for assessment of LVEF due to poor image quality. If patient agreeable, please arrange a limited echocardiogram with Definity contrast at [facility] to see if we can get better image quality and evaluation.

## 2015-03-01 NOTE — Telephone Encounter (Signed)
Pt agreeable to have repeat echocardiogram at [facility]. Forwarded to schedulers and pt aware that it will be Monday before we call back with appt.

## 2015-03-03 DIAGNOSIS — N186 End stage renal disease: Secondary | ICD-10-CM | POA: Diagnosis not present

## 2015-03-03 DIAGNOSIS — Z992 Dependence on renal dialysis: Secondary | ICD-10-CM | POA: Diagnosis not present

## 2015-03-04 ENCOUNTER — Telehealth: Payer: Self-pay | Admitting: Cardiology

## 2015-03-04 DIAGNOSIS — Z992 Dependence on renal dialysis: Secondary | ICD-10-CM | POA: Diagnosis not present

## 2015-03-04 DIAGNOSIS — D509 Iron deficiency anemia, unspecified: Secondary | ICD-10-CM | POA: Diagnosis not present

## 2015-03-04 DIAGNOSIS — N186 End stage renal disease: Secondary | ICD-10-CM | POA: Diagnosis not present

## 2015-03-04 DIAGNOSIS — N2581 Secondary hyperparathyroidism of renal origin: Secondary | ICD-10-CM | POA: Diagnosis not present

## 2015-03-04 NOTE — Telephone Encounter (Signed)
Pt has MCR and [employer] supplement.  No precert required

## 2015-03-04 NOTE — Telephone Encounter (Signed)
echo w/[place]/srs Schedule at [facility]  Aug 4th @ 930 arrive @ 915

## 2015-03-06 DIAGNOSIS — D509 Iron deficiency anemia, unspecified: Secondary | ICD-10-CM | POA: Diagnosis not present

## 2015-03-06 DIAGNOSIS — N2581 Secondary hyperparathyroidism of renal origin: Secondary | ICD-10-CM | POA: Diagnosis not present

## 2015-03-06 DIAGNOSIS — N186 End stage renal disease: Secondary | ICD-10-CM | POA: Diagnosis not present

## 2015-03-06 DIAGNOSIS — Z992 Dependence on renal dialysis: Secondary | ICD-10-CM | POA: Diagnosis not present

## 2015-03-07 ENCOUNTER — Other Ambulatory Visit: Payer: Self-pay | Admitting: Cardiology

## 2015-03-07 ENCOUNTER — Ambulatory Visit (HOSPITAL_COMMUNITY)
Admission: RE | Admit: 2015-03-07 | Discharge: 2015-03-07 | Disposition: A | Discharge status: Home / Self Care | Payer: Medicare Other | Source: Ambulatory Visit | Attending: Cardiology | Admitting: Cardiology

## 2015-03-07 DIAGNOSIS — R931 Abnormal findings on diagnostic imaging of heart and coronary circulation: Secondary | ICD-10-CM | POA: Insufficient documentation

## 2015-03-07 DIAGNOSIS — I509 Heart failure, unspecified: Secondary | ICD-10-CM | POA: Diagnosis not present

## 2015-03-07 MED ORDER — PERFLUTREN LIPID MICROSPHERE
1.0000 mL | INTRAVENOUS | Status: AC | PRN
  Administered 2015-03-07: 1 mL via INTRAVENOUS
  Administered 2015-03-07: 2 mL via INTRAVENOUS
  Administered 2015-03-07 (×2): 1 mL via INTRAVENOUS
  Filled 2015-03-07: qty 10

## 2015-03-08 ENCOUNTER — Telehealth: Payer: Self-pay | Admitting: *Deleted

## 2015-03-08 DIAGNOSIS — D509 Iron deficiency anemia, unspecified: Secondary | ICD-10-CM | POA: Diagnosis not present

## 2015-03-08 DIAGNOSIS — N2581 Secondary hyperparathyroidism of renal origin: Secondary | ICD-10-CM | POA: Diagnosis not present

## 2015-03-08 DIAGNOSIS — Z992 Dependence on renal dialysis: Secondary | ICD-10-CM | POA: Diagnosis not present

## 2015-03-08 DIAGNOSIS — N186 End stage renal disease: Secondary | ICD-10-CM | POA: Diagnosis not present

## 2015-03-08 NOTE — Telephone Encounter (Signed)
Patient informed. 

## 2015-03-08 NOTE — Telephone Encounter (Signed)
-----   Message from Satira Sark, MD sent at 03/07/2015 11:32 AM EDT ----- Reviewed report. Please let patient know that LVEF is in normal range, confirmed by better imaging quality on this particular study.

## 2015-03-11 DIAGNOSIS — Z992 Dependence on renal dialysis: Secondary | ICD-10-CM | POA: Diagnosis not present

## 2015-03-11 DIAGNOSIS — N2581 Secondary hyperparathyroidism of renal origin: Secondary | ICD-10-CM | POA: Diagnosis not present

## 2015-03-11 DIAGNOSIS — N186 End stage renal disease: Secondary | ICD-10-CM | POA: Diagnosis not present

## 2015-03-11 DIAGNOSIS — D509 Iron deficiency anemia, unspecified: Secondary | ICD-10-CM | POA: Diagnosis not present

## 2015-03-13 DIAGNOSIS — N2581 Secondary hyperparathyroidism of renal origin: Secondary | ICD-10-CM | POA: Diagnosis not present

## 2015-03-13 DIAGNOSIS — D509 Iron deficiency anemia, unspecified: Secondary | ICD-10-CM | POA: Diagnosis not present

## 2015-03-13 DIAGNOSIS — N186 End stage renal disease: Secondary | ICD-10-CM | POA: Diagnosis not present

## 2015-03-13 DIAGNOSIS — Z992 Dependence on renal dialysis: Secondary | ICD-10-CM | POA: Diagnosis not present

## 2015-03-15 DIAGNOSIS — N186 End stage renal disease: Secondary | ICD-10-CM | POA: Diagnosis not present

## 2015-03-15 DIAGNOSIS — D509 Iron deficiency anemia, unspecified: Secondary | ICD-10-CM | POA: Diagnosis not present

## 2015-03-15 DIAGNOSIS — Z992 Dependence on renal dialysis: Secondary | ICD-10-CM | POA: Diagnosis not present

## 2015-03-15 DIAGNOSIS — N2581 Secondary hyperparathyroidism of renal origin: Secondary | ICD-10-CM | POA: Diagnosis not present

## 2015-03-18 DIAGNOSIS — Z992 Dependence on renal dialysis: Secondary | ICD-10-CM | POA: Diagnosis not present

## 2015-03-18 DIAGNOSIS — D509 Iron deficiency anemia, unspecified: Secondary | ICD-10-CM | POA: Diagnosis not present

## 2015-03-18 DIAGNOSIS — N186 End stage renal disease: Secondary | ICD-10-CM | POA: Diagnosis not present

## 2015-03-18 DIAGNOSIS — N2581 Secondary hyperparathyroidism of renal origin: Secondary | ICD-10-CM | POA: Diagnosis not present

## 2015-03-19 DIAGNOSIS — M4722 Other spondylosis with radiculopathy, cervical region: Secondary | ICD-10-CM | POA: Diagnosis not present

## 2015-03-19 DIAGNOSIS — M75101 Unspecified rotator cuff tear or rupture of right shoulder, not specified as traumatic: Secondary | ICD-10-CM | POA: Diagnosis not present

## 2015-03-20 DIAGNOSIS — Z992 Dependence on renal dialysis: Secondary | ICD-10-CM | POA: Diagnosis not present

## 2015-03-20 DIAGNOSIS — N186 End stage renal disease: Secondary | ICD-10-CM | POA: Diagnosis not present

## 2015-03-20 DIAGNOSIS — N2581 Secondary hyperparathyroidism of renal origin: Secondary | ICD-10-CM | POA: Diagnosis not present

## 2015-03-20 DIAGNOSIS — D509 Iron deficiency anemia, unspecified: Secondary | ICD-10-CM | POA: Diagnosis not present

## 2015-03-22 DIAGNOSIS — D509 Iron deficiency anemia, unspecified: Secondary | ICD-10-CM | POA: Diagnosis not present

## 2015-03-22 DIAGNOSIS — N186 End stage renal disease: Secondary | ICD-10-CM | POA: Diagnosis not present

## 2015-03-22 DIAGNOSIS — Z992 Dependence on renal dialysis: Secondary | ICD-10-CM | POA: Diagnosis not present

## 2015-03-22 DIAGNOSIS — N2581 Secondary hyperparathyroidism of renal origin: Secondary | ICD-10-CM | POA: Diagnosis not present

## 2015-03-25 DIAGNOSIS — D509 Iron deficiency anemia, unspecified: Secondary | ICD-10-CM | POA: Diagnosis not present

## 2015-03-25 DIAGNOSIS — N2581 Secondary hyperparathyroidism of renal origin: Secondary | ICD-10-CM | POA: Diagnosis not present

## 2015-03-25 DIAGNOSIS — N186 End stage renal disease: Secondary | ICD-10-CM | POA: Diagnosis not present

## 2015-03-25 DIAGNOSIS — Z992 Dependence on renal dialysis: Secondary | ICD-10-CM | POA: Diagnosis not present

## 2015-03-27 DIAGNOSIS — Z992 Dependence on renal dialysis: Secondary | ICD-10-CM | POA: Diagnosis not present

## 2015-03-27 DIAGNOSIS — N2581 Secondary hyperparathyroidism of renal origin: Secondary | ICD-10-CM | POA: Diagnosis not present

## 2015-03-27 DIAGNOSIS — N186 End stage renal disease: Secondary | ICD-10-CM | POA: Diagnosis not present

## 2015-03-27 DIAGNOSIS — D509 Iron deficiency anemia, unspecified: Secondary | ICD-10-CM | POA: Diagnosis not present

## 2015-03-29 DIAGNOSIS — N186 End stage renal disease: Secondary | ICD-10-CM | POA: Diagnosis not present

## 2015-03-29 DIAGNOSIS — N2581 Secondary hyperparathyroidism of renal origin: Secondary | ICD-10-CM | POA: Diagnosis not present

## 2015-03-29 DIAGNOSIS — Z992 Dependence on renal dialysis: Secondary | ICD-10-CM | POA: Diagnosis not present

## 2015-03-29 DIAGNOSIS — D509 Iron deficiency anemia, unspecified: Secondary | ICD-10-CM | POA: Diagnosis not present

## 2015-04-01 DIAGNOSIS — D509 Iron deficiency anemia, unspecified: Secondary | ICD-10-CM | POA: Diagnosis not present

## 2015-04-01 DIAGNOSIS — N2581 Secondary hyperparathyroidism of renal origin: Secondary | ICD-10-CM | POA: Diagnosis not present

## 2015-04-01 DIAGNOSIS — N186 End stage renal disease: Secondary | ICD-10-CM | POA: Diagnosis not present

## 2015-04-01 DIAGNOSIS — Z992 Dependence on renal dialysis: Secondary | ICD-10-CM | POA: Diagnosis not present

## 2015-04-02 DIAGNOSIS — M75101 Unspecified rotator cuff tear or rupture of right shoulder, not specified as traumatic: Secondary | ICD-10-CM | POA: Diagnosis not present

## 2015-04-02 DIAGNOSIS — M4722 Other spondylosis with radiculopathy, cervical region: Secondary | ICD-10-CM | POA: Diagnosis not present

## 2015-04-03 DIAGNOSIS — N186 End stage renal disease: Secondary | ICD-10-CM | POA: Diagnosis not present

## 2015-04-03 DIAGNOSIS — N2581 Secondary hyperparathyroidism of renal origin: Secondary | ICD-10-CM | POA: Diagnosis not present

## 2015-04-03 DIAGNOSIS — Z992 Dependence on renal dialysis: Secondary | ICD-10-CM | POA: Diagnosis not present

## 2015-04-03 DIAGNOSIS — D509 Iron deficiency anemia, unspecified: Secondary | ICD-10-CM | POA: Diagnosis not present

## 2015-04-04 DIAGNOSIS — M5011 Cervical disc disorder with radiculopathy,  high cervical region: Secondary | ICD-10-CM | POA: Diagnosis not present

## 2015-04-04 DIAGNOSIS — M4722 Other spondylosis with radiculopathy, cervical region: Secondary | ICD-10-CM | POA: Diagnosis not present

## 2015-04-05 DIAGNOSIS — M4722 Other spondylosis with radiculopathy, cervical region: Secondary | ICD-10-CM | POA: Diagnosis not present

## 2015-04-05 DIAGNOSIS — D509 Iron deficiency anemia, unspecified: Secondary | ICD-10-CM | POA: Diagnosis not present

## 2015-04-05 DIAGNOSIS — M75101 Unspecified rotator cuff tear or rupture of right shoulder, not specified as traumatic: Secondary | ICD-10-CM | POA: Diagnosis not present

## 2015-04-05 DIAGNOSIS — Z992 Dependence on renal dialysis: Secondary | ICD-10-CM | POA: Diagnosis not present

## 2015-04-05 DIAGNOSIS — N2581 Secondary hyperparathyroidism of renal origin: Secondary | ICD-10-CM | POA: Diagnosis not present

## 2015-04-05 DIAGNOSIS — N186 End stage renal disease: Secondary | ICD-10-CM | POA: Diagnosis not present

## 2015-04-05 DIAGNOSIS — Z23 Encounter for immunization: Secondary | ICD-10-CM | POA: Diagnosis not present

## 2015-04-08 DIAGNOSIS — Z23 Encounter for immunization: Secondary | ICD-10-CM | POA: Diagnosis not present

## 2015-04-08 DIAGNOSIS — N186 End stage renal disease: Secondary | ICD-10-CM | POA: Diagnosis not present

## 2015-04-08 DIAGNOSIS — Z992 Dependence on renal dialysis: Secondary | ICD-10-CM | POA: Diagnosis not present

## 2015-04-08 DIAGNOSIS — D509 Iron deficiency anemia, unspecified: Secondary | ICD-10-CM | POA: Diagnosis not present

## 2015-04-08 DIAGNOSIS — N2581 Secondary hyperparathyroidism of renal origin: Secondary | ICD-10-CM | POA: Diagnosis not present

## 2015-04-10 DIAGNOSIS — N2581 Secondary hyperparathyroidism of renal origin: Secondary | ICD-10-CM | POA: Diagnosis not present

## 2015-04-10 DIAGNOSIS — Z992 Dependence on renal dialysis: Secondary | ICD-10-CM | POA: Diagnosis not present

## 2015-04-10 DIAGNOSIS — Z23 Encounter for immunization: Secondary | ICD-10-CM | POA: Diagnosis not present

## 2015-04-10 DIAGNOSIS — N186 End stage renal disease: Secondary | ICD-10-CM | POA: Diagnosis not present

## 2015-04-10 DIAGNOSIS — D509 Iron deficiency anemia, unspecified: Secondary | ICD-10-CM | POA: Diagnosis not present

## 2015-04-12 DIAGNOSIS — N2581 Secondary hyperparathyroidism of renal origin: Secondary | ICD-10-CM | POA: Diagnosis not present

## 2015-04-12 DIAGNOSIS — D509 Iron deficiency anemia, unspecified: Secondary | ICD-10-CM | POA: Diagnosis not present

## 2015-04-12 DIAGNOSIS — N186 End stage renal disease: Secondary | ICD-10-CM | POA: Diagnosis not present

## 2015-04-12 DIAGNOSIS — Z23 Encounter for immunization: Secondary | ICD-10-CM | POA: Diagnosis not present

## 2015-04-12 DIAGNOSIS — Z992 Dependence on renal dialysis: Secondary | ICD-10-CM | POA: Diagnosis not present

## 2015-04-15 DIAGNOSIS — Z23 Encounter for immunization: Secondary | ICD-10-CM | POA: Diagnosis not present

## 2015-04-15 DIAGNOSIS — D509 Iron deficiency anemia, unspecified: Secondary | ICD-10-CM | POA: Diagnosis not present

## 2015-04-15 DIAGNOSIS — N2581 Secondary hyperparathyroidism of renal origin: Secondary | ICD-10-CM | POA: Diagnosis not present

## 2015-04-15 DIAGNOSIS — N186 End stage renal disease: Secondary | ICD-10-CM | POA: Diagnosis not present

## 2015-04-15 DIAGNOSIS — Z992 Dependence on renal dialysis: Secondary | ICD-10-CM | POA: Diagnosis not present

## 2015-04-17 DIAGNOSIS — N2581 Secondary hyperparathyroidism of renal origin: Secondary | ICD-10-CM | POA: Diagnosis not present

## 2015-04-17 DIAGNOSIS — D509 Iron deficiency anemia, unspecified: Secondary | ICD-10-CM | POA: Diagnosis not present

## 2015-04-17 DIAGNOSIS — N186 End stage renal disease: Secondary | ICD-10-CM | POA: Diagnosis not present

## 2015-04-17 DIAGNOSIS — Z23 Encounter for immunization: Secondary | ICD-10-CM | POA: Diagnosis not present

## 2015-04-17 DIAGNOSIS — Z992 Dependence on renal dialysis: Secondary | ICD-10-CM | POA: Diagnosis not present

## 2015-04-19 DIAGNOSIS — D509 Iron deficiency anemia, unspecified: Secondary | ICD-10-CM | POA: Diagnosis not present

## 2015-04-19 DIAGNOSIS — Z23 Encounter for immunization: Secondary | ICD-10-CM | POA: Diagnosis not present

## 2015-04-19 DIAGNOSIS — N2581 Secondary hyperparathyroidism of renal origin: Secondary | ICD-10-CM | POA: Diagnosis not present

## 2015-04-19 DIAGNOSIS — Z992 Dependence on renal dialysis: Secondary | ICD-10-CM | POA: Diagnosis not present

## 2015-04-19 DIAGNOSIS — N186 End stage renal disease: Secondary | ICD-10-CM | POA: Diagnosis not present

## 2015-04-22 DIAGNOSIS — Z23 Encounter for immunization: Secondary | ICD-10-CM | POA: Diagnosis not present

## 2015-04-22 DIAGNOSIS — N2581 Secondary hyperparathyroidism of renal origin: Secondary | ICD-10-CM | POA: Diagnosis not present

## 2015-04-22 DIAGNOSIS — N186 End stage renal disease: Secondary | ICD-10-CM | POA: Diagnosis not present

## 2015-04-22 DIAGNOSIS — Z992 Dependence on renal dialysis: Secondary | ICD-10-CM | POA: Diagnosis not present

## 2015-04-22 DIAGNOSIS — D509 Iron deficiency anemia, unspecified: Secondary | ICD-10-CM | POA: Diagnosis not present

## 2015-04-24 DIAGNOSIS — Z23 Encounter for immunization: Secondary | ICD-10-CM | POA: Diagnosis not present

## 2015-04-24 DIAGNOSIS — N2581 Secondary hyperparathyroidism of renal origin: Secondary | ICD-10-CM | POA: Diagnosis not present

## 2015-04-24 DIAGNOSIS — N186 End stage renal disease: Secondary | ICD-10-CM | POA: Diagnosis not present

## 2015-04-24 DIAGNOSIS — D509 Iron deficiency anemia, unspecified: Secondary | ICD-10-CM | POA: Diagnosis not present

## 2015-04-24 DIAGNOSIS — Z992 Dependence on renal dialysis: Secondary | ICD-10-CM | POA: Diagnosis not present

## 2015-04-26 DIAGNOSIS — D509 Iron deficiency anemia, unspecified: Secondary | ICD-10-CM | POA: Diagnosis not present

## 2015-04-26 DIAGNOSIS — Z992 Dependence on renal dialysis: Secondary | ICD-10-CM | POA: Diagnosis not present

## 2015-04-26 DIAGNOSIS — N186 End stage renal disease: Secondary | ICD-10-CM | POA: Diagnosis not present

## 2015-04-26 DIAGNOSIS — N2581 Secondary hyperparathyroidism of renal origin: Secondary | ICD-10-CM | POA: Diagnosis not present

## 2015-04-26 DIAGNOSIS — Z23 Encounter for immunization: Secondary | ICD-10-CM | POA: Diagnosis not present

## 2015-04-29 DIAGNOSIS — E119 Type 2 diabetes mellitus without complications: Secondary | ICD-10-CM | POA: Diagnosis not present

## 2015-04-29 DIAGNOSIS — Z23 Encounter for immunization: Secondary | ICD-10-CM | POA: Diagnosis not present

## 2015-04-29 DIAGNOSIS — N2581 Secondary hyperparathyroidism of renal origin: Secondary | ICD-10-CM | POA: Diagnosis not present

## 2015-04-29 DIAGNOSIS — Z992 Dependence on renal dialysis: Secondary | ICD-10-CM | POA: Diagnosis not present

## 2015-04-29 DIAGNOSIS — N186 End stage renal disease: Secondary | ICD-10-CM | POA: Diagnosis not present

## 2015-04-29 DIAGNOSIS — Z794 Long term (current) use of insulin: Secondary | ICD-10-CM | POA: Diagnosis not present

## 2015-04-29 DIAGNOSIS — D509 Iron deficiency anemia, unspecified: Secondary | ICD-10-CM | POA: Diagnosis not present

## 2015-05-01 DIAGNOSIS — N2581 Secondary hyperparathyroidism of renal origin: Secondary | ICD-10-CM | POA: Diagnosis not present

## 2015-05-01 DIAGNOSIS — D509 Iron deficiency anemia, unspecified: Secondary | ICD-10-CM | POA: Diagnosis not present

## 2015-05-01 DIAGNOSIS — Z23 Encounter for immunization: Secondary | ICD-10-CM | POA: Diagnosis not present

## 2015-05-01 DIAGNOSIS — Z992 Dependence on renal dialysis: Secondary | ICD-10-CM | POA: Diagnosis not present

## 2015-05-01 DIAGNOSIS — N186 End stage renal disease: Secondary | ICD-10-CM | POA: Diagnosis not present

## 2015-05-02 DIAGNOSIS — M5032 Other cervical disc degeneration, mid-cervical region: Secondary | ICD-10-CM | POA: Diagnosis not present

## 2015-05-02 DIAGNOSIS — M542 Cervicalgia: Secondary | ICD-10-CM | POA: Diagnosis not present

## 2015-05-02 DIAGNOSIS — M5412 Radiculopathy, cervical region: Secondary | ICD-10-CM | POA: Diagnosis not present

## 2015-05-03 DIAGNOSIS — D509 Iron deficiency anemia, unspecified: Secondary | ICD-10-CM | POA: Diagnosis not present

## 2015-05-03 DIAGNOSIS — Z23 Encounter for immunization: Secondary | ICD-10-CM | POA: Diagnosis not present

## 2015-05-03 DIAGNOSIS — N186 End stage renal disease: Secondary | ICD-10-CM | POA: Diagnosis not present

## 2015-05-03 DIAGNOSIS — Z992 Dependence on renal dialysis: Secondary | ICD-10-CM | POA: Diagnosis not present

## 2015-05-03 DIAGNOSIS — N2581 Secondary hyperparathyroidism of renal origin: Secondary | ICD-10-CM | POA: Diagnosis not present

## 2015-05-06 DIAGNOSIS — Z992 Dependence on renal dialysis: Secondary | ICD-10-CM | POA: Diagnosis not present

## 2015-05-06 DIAGNOSIS — D509 Iron deficiency anemia, unspecified: Secondary | ICD-10-CM | POA: Diagnosis not present

## 2015-05-06 DIAGNOSIS — N186 End stage renal disease: Secondary | ICD-10-CM | POA: Diagnosis not present

## 2015-05-06 DIAGNOSIS — N2581 Secondary hyperparathyroidism of renal origin: Secondary | ICD-10-CM | POA: Diagnosis not present

## 2015-05-08 DIAGNOSIS — N2581 Secondary hyperparathyroidism of renal origin: Secondary | ICD-10-CM | POA: Diagnosis not present

## 2015-05-08 DIAGNOSIS — Z992 Dependence on renal dialysis: Secondary | ICD-10-CM | POA: Diagnosis not present

## 2015-05-08 DIAGNOSIS — N186 End stage renal disease: Secondary | ICD-10-CM | POA: Diagnosis not present

## 2015-05-08 DIAGNOSIS — D509 Iron deficiency anemia, unspecified: Secondary | ICD-10-CM | POA: Diagnosis not present

## 2015-05-10 DIAGNOSIS — N2581 Secondary hyperparathyroidism of renal origin: Secondary | ICD-10-CM | POA: Diagnosis not present

## 2015-05-10 DIAGNOSIS — D509 Iron deficiency anemia, unspecified: Secondary | ICD-10-CM | POA: Diagnosis not present

## 2015-05-10 DIAGNOSIS — Z992 Dependence on renal dialysis: Secondary | ICD-10-CM | POA: Diagnosis not present

## 2015-05-10 DIAGNOSIS — N186 End stage renal disease: Secondary | ICD-10-CM | POA: Diagnosis not present

## 2015-05-13 DIAGNOSIS — D509 Iron deficiency anemia, unspecified: Secondary | ICD-10-CM | POA: Diagnosis not present

## 2015-05-13 DIAGNOSIS — N186 End stage renal disease: Secondary | ICD-10-CM | POA: Diagnosis not present

## 2015-05-13 DIAGNOSIS — N2581 Secondary hyperparathyroidism of renal origin: Secondary | ICD-10-CM | POA: Diagnosis not present

## 2015-05-13 DIAGNOSIS — Z992 Dependence on renal dialysis: Secondary | ICD-10-CM | POA: Diagnosis not present

## 2015-05-15 DIAGNOSIS — Z992 Dependence on renal dialysis: Secondary | ICD-10-CM | POA: Diagnosis not present

## 2015-05-15 DIAGNOSIS — N2581 Secondary hyperparathyroidism of renal origin: Secondary | ICD-10-CM | POA: Diagnosis not present

## 2015-05-15 DIAGNOSIS — N186 End stage renal disease: Secondary | ICD-10-CM | POA: Diagnosis not present

## 2015-05-15 DIAGNOSIS — D509 Iron deficiency anemia, unspecified: Secondary | ICD-10-CM | POA: Diagnosis not present

## 2015-05-16 DIAGNOSIS — M542 Cervicalgia: Secondary | ICD-10-CM | POA: Diagnosis not present

## 2015-05-16 DIAGNOSIS — M5032 Other cervical disc degeneration, mid-cervical region, unspecified level: Secondary | ICD-10-CM | POA: Diagnosis not present

## 2015-05-16 DIAGNOSIS — M5412 Radiculopathy, cervical region: Secondary | ICD-10-CM | POA: Diagnosis not present

## 2015-05-17 DIAGNOSIS — N2581 Secondary hyperparathyroidism of renal origin: Secondary | ICD-10-CM | POA: Diagnosis not present

## 2015-05-17 DIAGNOSIS — D509 Iron deficiency anemia, unspecified: Secondary | ICD-10-CM | POA: Diagnosis not present

## 2015-05-17 DIAGNOSIS — N186 End stage renal disease: Secondary | ICD-10-CM | POA: Diagnosis not present

## 2015-05-17 DIAGNOSIS — Z992 Dependence on renal dialysis: Secondary | ICD-10-CM | POA: Diagnosis not present

## 2015-05-20 DIAGNOSIS — N2581 Secondary hyperparathyroidism of renal origin: Secondary | ICD-10-CM | POA: Diagnosis not present

## 2015-05-20 DIAGNOSIS — Z992 Dependence on renal dialysis: Secondary | ICD-10-CM | POA: Diagnosis not present

## 2015-05-20 DIAGNOSIS — N186 End stage renal disease: Secondary | ICD-10-CM | POA: Diagnosis not present

## 2015-05-20 DIAGNOSIS — D509 Iron deficiency anemia, unspecified: Secondary | ICD-10-CM | POA: Diagnosis not present

## 2015-05-22 DIAGNOSIS — D509 Iron deficiency anemia, unspecified: Secondary | ICD-10-CM | POA: Diagnosis not present

## 2015-05-22 DIAGNOSIS — N2581 Secondary hyperparathyroidism of renal origin: Secondary | ICD-10-CM | POA: Diagnosis not present

## 2015-05-22 DIAGNOSIS — Z992 Dependence on renal dialysis: Secondary | ICD-10-CM | POA: Diagnosis not present

## 2015-05-22 DIAGNOSIS — N186 End stage renal disease: Secondary | ICD-10-CM | POA: Diagnosis not present

## 2015-05-24 DIAGNOSIS — N2581 Secondary hyperparathyroidism of renal origin: Secondary | ICD-10-CM | POA: Diagnosis not present

## 2015-05-24 DIAGNOSIS — D509 Iron deficiency anemia, unspecified: Secondary | ICD-10-CM | POA: Diagnosis not present

## 2015-05-24 DIAGNOSIS — Z992 Dependence on renal dialysis: Secondary | ICD-10-CM | POA: Diagnosis not present

## 2015-05-24 DIAGNOSIS — N186 End stage renal disease: Secondary | ICD-10-CM | POA: Diagnosis not present

## 2015-05-27 DIAGNOSIS — Z992 Dependence on renal dialysis: Secondary | ICD-10-CM | POA: Diagnosis not present

## 2015-05-27 DIAGNOSIS — N2581 Secondary hyperparathyroidism of renal origin: Secondary | ICD-10-CM | POA: Diagnosis not present

## 2015-05-27 DIAGNOSIS — N186 End stage renal disease: Secondary | ICD-10-CM | POA: Diagnosis not present

## 2015-05-27 DIAGNOSIS — D509 Iron deficiency anemia, unspecified: Secondary | ICD-10-CM | POA: Diagnosis not present

## 2015-05-29 DIAGNOSIS — Z992 Dependence on renal dialysis: Secondary | ICD-10-CM | POA: Diagnosis not present

## 2015-05-29 DIAGNOSIS — D509 Iron deficiency anemia, unspecified: Secondary | ICD-10-CM | POA: Diagnosis not present

## 2015-05-29 DIAGNOSIS — N186 End stage renal disease: Secondary | ICD-10-CM | POA: Diagnosis not present

## 2015-05-29 DIAGNOSIS — N2581 Secondary hyperparathyroidism of renal origin: Secondary | ICD-10-CM | POA: Diagnosis not present

## 2015-05-30 DIAGNOSIS — E103291 Type 1 diabetes mellitus with mild nonproliferative diabetic retinopathy without macular edema, right eye: Secondary | ICD-10-CM | POA: Diagnosis not present

## 2015-05-30 DIAGNOSIS — Z961 Presence of intraocular lens: Secondary | ICD-10-CM | POA: Diagnosis not present

## 2015-05-31 DIAGNOSIS — N2581 Secondary hyperparathyroidism of renal origin: Secondary | ICD-10-CM | POA: Diagnosis not present

## 2015-05-31 DIAGNOSIS — Z992 Dependence on renal dialysis: Secondary | ICD-10-CM | POA: Diagnosis not present

## 2015-05-31 DIAGNOSIS — N186 End stage renal disease: Secondary | ICD-10-CM | POA: Diagnosis not present

## 2015-05-31 DIAGNOSIS — D509 Iron deficiency anemia, unspecified: Secondary | ICD-10-CM | POA: Diagnosis not present

## 2015-06-03 DIAGNOSIS — Z992 Dependence on renal dialysis: Secondary | ICD-10-CM | POA: Diagnosis not present

## 2015-06-03 DIAGNOSIS — N2581 Secondary hyperparathyroidism of renal origin: Secondary | ICD-10-CM | POA: Diagnosis not present

## 2015-06-03 DIAGNOSIS — D509 Iron deficiency anemia, unspecified: Secondary | ICD-10-CM | POA: Diagnosis not present

## 2015-06-03 DIAGNOSIS — N186 End stage renal disease: Secondary | ICD-10-CM | POA: Diagnosis not present

## 2015-06-05 DIAGNOSIS — D509 Iron deficiency anemia, unspecified: Secondary | ICD-10-CM | POA: Diagnosis not present

## 2015-06-05 DIAGNOSIS — N186 End stage renal disease: Secondary | ICD-10-CM | POA: Diagnosis not present

## 2015-06-05 DIAGNOSIS — Z992 Dependence on renal dialysis: Secondary | ICD-10-CM | POA: Diagnosis not present

## 2015-06-05 DIAGNOSIS — N2581 Secondary hyperparathyroidism of renal origin: Secondary | ICD-10-CM | POA: Diagnosis not present

## 2015-06-07 DIAGNOSIS — Z992 Dependence on renal dialysis: Secondary | ICD-10-CM | POA: Diagnosis not present

## 2015-06-07 DIAGNOSIS — N186 End stage renal disease: Secondary | ICD-10-CM | POA: Diagnosis not present

## 2015-06-07 DIAGNOSIS — N2581 Secondary hyperparathyroidism of renal origin: Secondary | ICD-10-CM | POA: Diagnosis not present

## 2015-06-07 DIAGNOSIS — D509 Iron deficiency anemia, unspecified: Secondary | ICD-10-CM | POA: Diagnosis not present

## 2015-06-10 DIAGNOSIS — N186 End stage renal disease: Secondary | ICD-10-CM | POA: Diagnosis not present

## 2015-06-10 DIAGNOSIS — D509 Iron deficiency anemia, unspecified: Secondary | ICD-10-CM | POA: Diagnosis not present

## 2015-06-10 DIAGNOSIS — N2581 Secondary hyperparathyroidism of renal origin: Secondary | ICD-10-CM | POA: Diagnosis not present

## 2015-06-10 DIAGNOSIS — Z992 Dependence on renal dialysis: Secondary | ICD-10-CM | POA: Diagnosis not present

## 2015-06-12 DIAGNOSIS — N186 End stage renal disease: Secondary | ICD-10-CM | POA: Diagnosis not present

## 2015-06-12 DIAGNOSIS — D509 Iron deficiency anemia, unspecified: Secondary | ICD-10-CM | POA: Diagnosis not present

## 2015-06-12 DIAGNOSIS — Z992 Dependence on renal dialysis: Secondary | ICD-10-CM | POA: Diagnosis not present

## 2015-06-12 DIAGNOSIS — N2581 Secondary hyperparathyroidism of renal origin: Secondary | ICD-10-CM | POA: Diagnosis not present

## 2015-06-14 DIAGNOSIS — D509 Iron deficiency anemia, unspecified: Secondary | ICD-10-CM | POA: Diagnosis not present

## 2015-06-14 DIAGNOSIS — N2581 Secondary hyperparathyroidism of renal origin: Secondary | ICD-10-CM | POA: Diagnosis not present

## 2015-06-14 DIAGNOSIS — N186 End stage renal disease: Secondary | ICD-10-CM | POA: Diagnosis not present

## 2015-06-14 DIAGNOSIS — Z992 Dependence on renal dialysis: Secondary | ICD-10-CM | POA: Diagnosis not present

## 2015-06-17 DIAGNOSIS — Z992 Dependence on renal dialysis: Secondary | ICD-10-CM | POA: Diagnosis not present

## 2015-06-17 DIAGNOSIS — N2581 Secondary hyperparathyroidism of renal origin: Secondary | ICD-10-CM | POA: Diagnosis not present

## 2015-06-17 DIAGNOSIS — D509 Iron deficiency anemia, unspecified: Secondary | ICD-10-CM | POA: Diagnosis not present

## 2015-06-17 DIAGNOSIS — N186 End stage renal disease: Secondary | ICD-10-CM | POA: Diagnosis not present

## 2015-06-19 DIAGNOSIS — N186 End stage renal disease: Secondary | ICD-10-CM | POA: Diagnosis not present

## 2015-06-19 DIAGNOSIS — D509 Iron deficiency anemia, unspecified: Secondary | ICD-10-CM | POA: Diagnosis not present

## 2015-06-19 DIAGNOSIS — N2581 Secondary hyperparathyroidism of renal origin: Secondary | ICD-10-CM | POA: Diagnosis not present

## 2015-06-19 DIAGNOSIS — Z992 Dependence on renal dialysis: Secondary | ICD-10-CM | POA: Diagnosis not present

## 2015-06-21 DIAGNOSIS — D509 Iron deficiency anemia, unspecified: Secondary | ICD-10-CM | POA: Diagnosis not present

## 2015-06-21 DIAGNOSIS — Z992 Dependence on renal dialysis: Secondary | ICD-10-CM | POA: Diagnosis not present

## 2015-06-21 DIAGNOSIS — N186 End stage renal disease: Secondary | ICD-10-CM | POA: Diagnosis not present

## 2015-06-21 DIAGNOSIS — N2581 Secondary hyperparathyroidism of renal origin: Secondary | ICD-10-CM | POA: Diagnosis not present

## 2015-06-24 DIAGNOSIS — D509 Iron deficiency anemia, unspecified: Secondary | ICD-10-CM | POA: Diagnosis not present

## 2015-06-24 DIAGNOSIS — Z992 Dependence on renal dialysis: Secondary | ICD-10-CM | POA: Diagnosis not present

## 2015-06-24 DIAGNOSIS — N2581 Secondary hyperparathyroidism of renal origin: Secondary | ICD-10-CM | POA: Diagnosis not present

## 2015-06-24 DIAGNOSIS — N186 End stage renal disease: Secondary | ICD-10-CM | POA: Diagnosis not present

## 2015-06-26 DIAGNOSIS — N2581 Secondary hyperparathyroidism of renal origin: Secondary | ICD-10-CM | POA: Diagnosis not present

## 2015-06-26 DIAGNOSIS — Z992 Dependence on renal dialysis: Secondary | ICD-10-CM | POA: Diagnosis not present

## 2015-06-26 DIAGNOSIS — D509 Iron deficiency anemia, unspecified: Secondary | ICD-10-CM | POA: Diagnosis not present

## 2015-06-26 DIAGNOSIS — N186 End stage renal disease: Secondary | ICD-10-CM | POA: Diagnosis not present

## 2015-06-28 DIAGNOSIS — N186 End stage renal disease: Secondary | ICD-10-CM | POA: Diagnosis not present

## 2015-06-28 DIAGNOSIS — Z992 Dependence on renal dialysis: Secondary | ICD-10-CM | POA: Diagnosis not present

## 2015-06-28 DIAGNOSIS — N2581 Secondary hyperparathyroidism of renal origin: Secondary | ICD-10-CM | POA: Diagnosis not present

## 2015-06-28 DIAGNOSIS — D509 Iron deficiency anemia, unspecified: Secondary | ICD-10-CM | POA: Diagnosis not present

## 2015-07-01 DIAGNOSIS — N186 End stage renal disease: Secondary | ICD-10-CM | POA: Diagnosis not present

## 2015-07-01 DIAGNOSIS — D509 Iron deficiency anemia, unspecified: Secondary | ICD-10-CM | POA: Diagnosis not present

## 2015-07-01 DIAGNOSIS — N2581 Secondary hyperparathyroidism of renal origin: Secondary | ICD-10-CM | POA: Diagnosis not present

## 2015-07-01 DIAGNOSIS — Z992 Dependence on renal dialysis: Secondary | ICD-10-CM | POA: Diagnosis not present

## 2015-07-03 DIAGNOSIS — N186 End stage renal disease: Secondary | ICD-10-CM | POA: Diagnosis not present

## 2015-07-03 DIAGNOSIS — Z992 Dependence on renal dialysis: Secondary | ICD-10-CM | POA: Diagnosis not present

## 2015-07-03 DIAGNOSIS — D509 Iron deficiency anemia, unspecified: Secondary | ICD-10-CM | POA: Diagnosis not present

## 2015-07-03 DIAGNOSIS — N2581 Secondary hyperparathyroidism of renal origin: Secondary | ICD-10-CM | POA: Diagnosis not present

## 2015-07-05 DIAGNOSIS — Z992 Dependence on renal dialysis: Secondary | ICD-10-CM | POA: Diagnosis not present

## 2015-07-05 DIAGNOSIS — N186 End stage renal disease: Secondary | ICD-10-CM | POA: Diagnosis not present

## 2015-07-05 DIAGNOSIS — D509 Iron deficiency anemia, unspecified: Secondary | ICD-10-CM | POA: Diagnosis not present

## 2015-07-05 DIAGNOSIS — N2581 Secondary hyperparathyroidism of renal origin: Secondary | ICD-10-CM | POA: Diagnosis not present

## 2015-07-08 DIAGNOSIS — N186 End stage renal disease: Secondary | ICD-10-CM | POA: Diagnosis not present

## 2015-07-08 DIAGNOSIS — D509 Iron deficiency anemia, unspecified: Secondary | ICD-10-CM | POA: Diagnosis not present

## 2015-07-08 DIAGNOSIS — N2581 Secondary hyperparathyroidism of renal origin: Secondary | ICD-10-CM | POA: Diagnosis not present

## 2015-07-08 DIAGNOSIS — Z992 Dependence on renal dialysis: Secondary | ICD-10-CM | POA: Diagnosis not present

## 2015-07-10 DIAGNOSIS — D509 Iron deficiency anemia, unspecified: Secondary | ICD-10-CM | POA: Diagnosis not present

## 2015-07-10 DIAGNOSIS — Z992 Dependence on renal dialysis: Secondary | ICD-10-CM | POA: Diagnosis not present

## 2015-07-10 DIAGNOSIS — N2581 Secondary hyperparathyroidism of renal origin: Secondary | ICD-10-CM | POA: Diagnosis not present

## 2015-07-10 DIAGNOSIS — N186 End stage renal disease: Secondary | ICD-10-CM | POA: Diagnosis not present

## 2015-07-12 DIAGNOSIS — Z992 Dependence on renal dialysis: Secondary | ICD-10-CM | POA: Diagnosis not present

## 2015-07-12 DIAGNOSIS — N186 End stage renal disease: Secondary | ICD-10-CM | POA: Diagnosis not present

## 2015-07-12 DIAGNOSIS — D509 Iron deficiency anemia, unspecified: Secondary | ICD-10-CM | POA: Diagnosis not present

## 2015-07-12 DIAGNOSIS — N2581 Secondary hyperparathyroidism of renal origin: Secondary | ICD-10-CM | POA: Diagnosis not present

## 2015-07-15 DIAGNOSIS — D509 Iron deficiency anemia, unspecified: Secondary | ICD-10-CM | POA: Diagnosis not present

## 2015-07-15 DIAGNOSIS — N186 End stage renal disease: Secondary | ICD-10-CM | POA: Diagnosis not present

## 2015-07-15 DIAGNOSIS — N2581 Secondary hyperparathyroidism of renal origin: Secondary | ICD-10-CM | POA: Diagnosis not present

## 2015-07-15 DIAGNOSIS — Z992 Dependence on renal dialysis: Secondary | ICD-10-CM | POA: Diagnosis not present

## 2015-07-17 DIAGNOSIS — N2581 Secondary hyperparathyroidism of renal origin: Secondary | ICD-10-CM | POA: Diagnosis not present

## 2015-07-17 DIAGNOSIS — Z992 Dependence on renal dialysis: Secondary | ICD-10-CM | POA: Diagnosis not present

## 2015-07-17 DIAGNOSIS — N186 End stage renal disease: Secondary | ICD-10-CM | POA: Diagnosis not present

## 2015-07-17 DIAGNOSIS — D509 Iron deficiency anemia, unspecified: Secondary | ICD-10-CM | POA: Diagnosis not present

## 2015-07-19 DIAGNOSIS — D509 Iron deficiency anemia, unspecified: Secondary | ICD-10-CM | POA: Diagnosis not present

## 2015-07-19 DIAGNOSIS — N2581 Secondary hyperparathyroidism of renal origin: Secondary | ICD-10-CM | POA: Diagnosis not present

## 2015-07-19 DIAGNOSIS — Z992 Dependence on renal dialysis: Secondary | ICD-10-CM | POA: Diagnosis not present

## 2015-07-19 DIAGNOSIS — N186 End stage renal disease: Secondary | ICD-10-CM | POA: Diagnosis not present

## 2015-07-22 DIAGNOSIS — E119 Type 2 diabetes mellitus without complications: Secondary | ICD-10-CM | POA: Diagnosis not present

## 2015-07-22 DIAGNOSIS — N2581 Secondary hyperparathyroidism of renal origin: Secondary | ICD-10-CM | POA: Diagnosis not present

## 2015-07-22 DIAGNOSIS — N186 End stage renal disease: Secondary | ICD-10-CM | POA: Diagnosis not present

## 2015-07-22 DIAGNOSIS — D509 Iron deficiency anemia, unspecified: Secondary | ICD-10-CM | POA: Diagnosis not present

## 2015-07-22 DIAGNOSIS — Z794 Long term (current) use of insulin: Secondary | ICD-10-CM | POA: Diagnosis not present

## 2015-07-22 DIAGNOSIS — Z992 Dependence on renal dialysis: Secondary | ICD-10-CM | POA: Diagnosis not present

## 2015-07-24 DIAGNOSIS — N2581 Secondary hyperparathyroidism of renal origin: Secondary | ICD-10-CM | POA: Diagnosis not present

## 2015-07-24 DIAGNOSIS — N186 End stage renal disease: Secondary | ICD-10-CM | POA: Diagnosis not present

## 2015-07-24 DIAGNOSIS — Z992 Dependence on renal dialysis: Secondary | ICD-10-CM | POA: Diagnosis not present

## 2015-07-24 DIAGNOSIS — D509 Iron deficiency anemia, unspecified: Secondary | ICD-10-CM | POA: Diagnosis not present

## 2015-07-26 DIAGNOSIS — N186 End stage renal disease: Secondary | ICD-10-CM | POA: Diagnosis not present

## 2015-07-26 DIAGNOSIS — Z992 Dependence on renal dialysis: Secondary | ICD-10-CM | POA: Diagnosis not present

## 2015-07-26 DIAGNOSIS — D509 Iron deficiency anemia, unspecified: Secondary | ICD-10-CM | POA: Diagnosis not present

## 2015-07-26 DIAGNOSIS — N2581 Secondary hyperparathyroidism of renal origin: Secondary | ICD-10-CM | POA: Diagnosis not present

## 2015-07-29 DIAGNOSIS — Z992 Dependence on renal dialysis: Secondary | ICD-10-CM | POA: Diagnosis not present

## 2015-07-29 DIAGNOSIS — N186 End stage renal disease: Secondary | ICD-10-CM | POA: Diagnosis not present

## 2015-07-29 DIAGNOSIS — D509 Iron deficiency anemia, unspecified: Secondary | ICD-10-CM | POA: Diagnosis not present

## 2015-07-29 DIAGNOSIS — N2581 Secondary hyperparathyroidism of renal origin: Secondary | ICD-10-CM | POA: Diagnosis not present

## 2015-07-31 DIAGNOSIS — D509 Iron deficiency anemia, unspecified: Secondary | ICD-10-CM | POA: Diagnosis not present

## 2015-07-31 DIAGNOSIS — Z992 Dependence on renal dialysis: Secondary | ICD-10-CM | POA: Diagnosis not present

## 2015-07-31 DIAGNOSIS — N186 End stage renal disease: Secondary | ICD-10-CM | POA: Diagnosis not present

## 2015-07-31 DIAGNOSIS — N2581 Secondary hyperparathyroidism of renal origin: Secondary | ICD-10-CM | POA: Diagnosis not present

## 2015-08-02 DIAGNOSIS — Z992 Dependence on renal dialysis: Secondary | ICD-10-CM | POA: Diagnosis not present

## 2015-08-02 DIAGNOSIS — N186 End stage renal disease: Secondary | ICD-10-CM | POA: Diagnosis not present

## 2015-08-02 DIAGNOSIS — D509 Iron deficiency anemia, unspecified: Secondary | ICD-10-CM | POA: Diagnosis not present

## 2015-08-02 DIAGNOSIS — N2581 Secondary hyperparathyroidism of renal origin: Secondary | ICD-10-CM | POA: Diagnosis not present

## 2015-08-03 DIAGNOSIS — N186 End stage renal disease: Secondary | ICD-10-CM | POA: Diagnosis not present

## 2015-08-03 DIAGNOSIS — Z992 Dependence on renal dialysis: Secondary | ICD-10-CM | POA: Diagnosis not present

## 2015-08-05 DIAGNOSIS — N2581 Secondary hyperparathyroidism of renal origin: Secondary | ICD-10-CM | POA: Diagnosis not present

## 2015-08-05 DIAGNOSIS — D509 Iron deficiency anemia, unspecified: Secondary | ICD-10-CM | POA: Diagnosis not present

## 2015-08-05 DIAGNOSIS — Z992 Dependence on renal dialysis: Secondary | ICD-10-CM | POA: Diagnosis not present

## 2015-08-05 DIAGNOSIS — N186 End stage renal disease: Secondary | ICD-10-CM | POA: Diagnosis not present

## 2015-08-07 DIAGNOSIS — Z992 Dependence on renal dialysis: Secondary | ICD-10-CM | POA: Diagnosis not present

## 2015-08-07 DIAGNOSIS — N186 End stage renal disease: Secondary | ICD-10-CM | POA: Diagnosis not present

## 2015-08-07 DIAGNOSIS — N2581 Secondary hyperparathyroidism of renal origin: Secondary | ICD-10-CM | POA: Diagnosis not present

## 2015-08-07 DIAGNOSIS — D509 Iron deficiency anemia, unspecified: Secondary | ICD-10-CM | POA: Diagnosis not present

## 2015-08-09 DIAGNOSIS — N186 End stage renal disease: Secondary | ICD-10-CM | POA: Diagnosis not present

## 2015-08-09 DIAGNOSIS — D509 Iron deficiency anemia, unspecified: Secondary | ICD-10-CM | POA: Diagnosis not present

## 2015-08-09 DIAGNOSIS — N2581 Secondary hyperparathyroidism of renal origin: Secondary | ICD-10-CM | POA: Diagnosis not present

## 2015-08-09 DIAGNOSIS — Z992 Dependence on renal dialysis: Secondary | ICD-10-CM | POA: Diagnosis not present

## 2015-08-12 DIAGNOSIS — D509 Iron deficiency anemia, unspecified: Secondary | ICD-10-CM | POA: Diagnosis not present

## 2015-08-12 DIAGNOSIS — N186 End stage renal disease: Secondary | ICD-10-CM | POA: Diagnosis not present

## 2015-08-12 DIAGNOSIS — Z992 Dependence on renal dialysis: Secondary | ICD-10-CM | POA: Diagnosis not present

## 2015-08-12 DIAGNOSIS — N2581 Secondary hyperparathyroidism of renal origin: Secondary | ICD-10-CM | POA: Diagnosis not present

## 2015-08-14 DIAGNOSIS — N186 End stage renal disease: Secondary | ICD-10-CM | POA: Diagnosis not present

## 2015-08-14 DIAGNOSIS — Z992 Dependence on renal dialysis: Secondary | ICD-10-CM | POA: Diagnosis not present

## 2015-08-14 DIAGNOSIS — N2581 Secondary hyperparathyroidism of renal origin: Secondary | ICD-10-CM | POA: Diagnosis not present

## 2015-08-14 DIAGNOSIS — D509 Iron deficiency anemia, unspecified: Secondary | ICD-10-CM | POA: Diagnosis not present

## 2015-08-15 ENCOUNTER — Encounter: Payer: Medicare Other | Admitting: Cardiology

## 2015-08-15 ENCOUNTER — Encounter: Payer: Self-pay | Admitting: Cardiology

## 2015-08-15 NOTE — Progress Notes (Signed)
No show  This encounter was created in error - please disregard.

## 2015-08-16 DIAGNOSIS — N186 End stage renal disease: Secondary | ICD-10-CM | POA: Diagnosis not present

## 2015-08-16 DIAGNOSIS — Z992 Dependence on renal dialysis: Secondary | ICD-10-CM | POA: Diagnosis not present

## 2015-08-16 DIAGNOSIS — D509 Iron deficiency anemia, unspecified: Secondary | ICD-10-CM | POA: Diagnosis not present

## 2015-08-16 DIAGNOSIS — N2581 Secondary hyperparathyroidism of renal origin: Secondary | ICD-10-CM | POA: Diagnosis not present

## 2015-08-19 DIAGNOSIS — D509 Iron deficiency anemia, unspecified: Secondary | ICD-10-CM | POA: Diagnosis not present

## 2015-08-19 DIAGNOSIS — N186 End stage renal disease: Secondary | ICD-10-CM | POA: Diagnosis not present

## 2015-08-19 DIAGNOSIS — N2581 Secondary hyperparathyroidism of renal origin: Secondary | ICD-10-CM | POA: Diagnosis not present

## 2015-08-19 DIAGNOSIS — Z992 Dependence on renal dialysis: Secondary | ICD-10-CM | POA: Diagnosis not present

## 2015-08-21 DIAGNOSIS — Z992 Dependence on renal dialysis: Secondary | ICD-10-CM | POA: Diagnosis not present

## 2015-08-21 DIAGNOSIS — D509 Iron deficiency anemia, unspecified: Secondary | ICD-10-CM | POA: Diagnosis not present

## 2015-08-21 DIAGNOSIS — N2581 Secondary hyperparathyroidism of renal origin: Secondary | ICD-10-CM | POA: Diagnosis not present

## 2015-08-21 DIAGNOSIS — N186 End stage renal disease: Secondary | ICD-10-CM | POA: Diagnosis not present

## 2015-08-23 DIAGNOSIS — D509 Iron deficiency anemia, unspecified: Secondary | ICD-10-CM | POA: Diagnosis not present

## 2015-08-23 DIAGNOSIS — N2581 Secondary hyperparathyroidism of renal origin: Secondary | ICD-10-CM | POA: Diagnosis not present

## 2015-08-23 DIAGNOSIS — N186 End stage renal disease: Secondary | ICD-10-CM | POA: Diagnosis not present

## 2015-08-23 DIAGNOSIS — Z992 Dependence on renal dialysis: Secondary | ICD-10-CM | POA: Diagnosis not present

## 2015-08-26 DIAGNOSIS — D509 Iron deficiency anemia, unspecified: Secondary | ICD-10-CM | POA: Diagnosis not present

## 2015-08-26 DIAGNOSIS — N2581 Secondary hyperparathyroidism of renal origin: Secondary | ICD-10-CM | POA: Diagnosis not present

## 2015-08-26 DIAGNOSIS — N186 End stage renal disease: Secondary | ICD-10-CM | POA: Diagnosis not present

## 2015-08-26 DIAGNOSIS — Z992 Dependence on renal dialysis: Secondary | ICD-10-CM | POA: Diagnosis not present

## 2015-08-28 DIAGNOSIS — N2581 Secondary hyperparathyroidism of renal origin: Secondary | ICD-10-CM | POA: Diagnosis not present

## 2015-08-28 DIAGNOSIS — N186 End stage renal disease: Secondary | ICD-10-CM | POA: Diagnosis not present

## 2015-08-28 DIAGNOSIS — D509 Iron deficiency anemia, unspecified: Secondary | ICD-10-CM | POA: Diagnosis not present

## 2015-08-28 DIAGNOSIS — Z992 Dependence on renal dialysis: Secondary | ICD-10-CM | POA: Diagnosis not present

## 2015-08-30 DIAGNOSIS — N186 End stage renal disease: Secondary | ICD-10-CM | POA: Diagnosis not present

## 2015-08-30 DIAGNOSIS — Z992 Dependence on renal dialysis: Secondary | ICD-10-CM | POA: Diagnosis not present

## 2015-08-30 DIAGNOSIS — D509 Iron deficiency anemia, unspecified: Secondary | ICD-10-CM | POA: Diagnosis not present

## 2015-08-30 DIAGNOSIS — N2581 Secondary hyperparathyroidism of renal origin: Secondary | ICD-10-CM | POA: Diagnosis not present

## 2015-09-02 DIAGNOSIS — Z992 Dependence on renal dialysis: Secondary | ICD-10-CM | POA: Diagnosis not present

## 2015-09-02 DIAGNOSIS — D509 Iron deficiency anemia, unspecified: Secondary | ICD-10-CM | POA: Diagnosis not present

## 2015-09-02 DIAGNOSIS — N186 End stage renal disease: Secondary | ICD-10-CM | POA: Diagnosis not present

## 2015-09-02 DIAGNOSIS — N2581 Secondary hyperparathyroidism of renal origin: Secondary | ICD-10-CM | POA: Diagnosis not present

## 2015-09-03 DIAGNOSIS — N186 End stage renal disease: Secondary | ICD-10-CM | POA: Diagnosis not present

## 2015-09-03 DIAGNOSIS — Z992 Dependence on renal dialysis: Secondary | ICD-10-CM | POA: Diagnosis not present

## 2015-09-04 DIAGNOSIS — Z992 Dependence on renal dialysis: Secondary | ICD-10-CM | POA: Diagnosis not present

## 2015-09-04 DIAGNOSIS — N2581 Secondary hyperparathyroidism of renal origin: Secondary | ICD-10-CM | POA: Diagnosis not present

## 2015-09-04 DIAGNOSIS — N186 End stage renal disease: Secondary | ICD-10-CM | POA: Diagnosis not present

## 2015-09-04 DIAGNOSIS — D509 Iron deficiency anemia, unspecified: Secondary | ICD-10-CM | POA: Diagnosis not present

## 2015-09-06 DIAGNOSIS — Z992 Dependence on renal dialysis: Secondary | ICD-10-CM | POA: Diagnosis not present

## 2015-09-06 DIAGNOSIS — N186 End stage renal disease: Secondary | ICD-10-CM | POA: Diagnosis not present

## 2015-09-06 DIAGNOSIS — D509 Iron deficiency anemia, unspecified: Secondary | ICD-10-CM | POA: Diagnosis not present

## 2015-09-06 DIAGNOSIS — N2581 Secondary hyperparathyroidism of renal origin: Secondary | ICD-10-CM | POA: Diagnosis not present

## 2015-09-09 DIAGNOSIS — D509 Iron deficiency anemia, unspecified: Secondary | ICD-10-CM | POA: Diagnosis not present

## 2015-09-09 DIAGNOSIS — N186 End stage renal disease: Secondary | ICD-10-CM | POA: Diagnosis not present

## 2015-09-09 DIAGNOSIS — Z992 Dependence on renal dialysis: Secondary | ICD-10-CM | POA: Diagnosis not present

## 2015-09-09 DIAGNOSIS — N2581 Secondary hyperparathyroidism of renal origin: Secondary | ICD-10-CM | POA: Diagnosis not present

## 2015-09-11 DIAGNOSIS — D509 Iron deficiency anemia, unspecified: Secondary | ICD-10-CM | POA: Diagnosis not present

## 2015-09-11 DIAGNOSIS — Z992 Dependence on renal dialysis: Secondary | ICD-10-CM | POA: Diagnosis not present

## 2015-09-11 DIAGNOSIS — N2581 Secondary hyperparathyroidism of renal origin: Secondary | ICD-10-CM | POA: Diagnosis not present

## 2015-09-11 DIAGNOSIS — N186 End stage renal disease: Secondary | ICD-10-CM | POA: Diagnosis not present

## 2015-09-12 ENCOUNTER — Ambulatory Visit (INDEPENDENT_AMBULATORY_CARE_PROVIDER_SITE_OTHER): Payer: Medicare Other | Admitting: Cardiology

## 2015-09-12 ENCOUNTER — Encounter: Payer: Self-pay | Admitting: Cardiology

## 2015-09-12 VITALS — BP 130/88 | HR 74 | Ht 74.0 in | Wt 303.0 lb

## 2015-09-12 DIAGNOSIS — Z992 Dependence on renal dialysis: Secondary | ICD-10-CM

## 2015-09-12 DIAGNOSIS — I429 Cardiomyopathy, unspecified: Secondary | ICD-10-CM | POA: Diagnosis not present

## 2015-09-12 DIAGNOSIS — N186 End stage renal disease: Secondary | ICD-10-CM | POA: Diagnosis not present

## 2015-09-12 DIAGNOSIS — I10 Essential (primary) hypertension: Secondary | ICD-10-CM | POA: Diagnosis not present

## 2015-09-12 DIAGNOSIS — I428 Other cardiomyopathies: Secondary | ICD-10-CM

## 2015-09-12 NOTE — Patient Instructions (Signed)

## 2015-09-12 NOTE — Progress Notes (Signed)
Cardiology Office Note  Date: 09/12/2015   ID: Jacob [name], DOB 08-Oct-1950, MRN [id number]  PCP: Purvis Kilts, MD  Primary Cardiologist: Rozann Lesches, MD   Chief Complaint  Patient presents with  . Cardiomyopathy    History of Present Illness: Jacob [name] is a 65 y.o. male last seen in July 2016. He presents for a routine follow-up visit. He does not report any exertional chest pain or palpitations, describes NYHA class II dyspnea. He continues on hemodialysis, does not report any access problems since prior evaluation with Dr. Bridgett Larsson.  I reviewed his medications which are outlined below. From a cardiac perspective he continues on Coreg and Altace. He does hold the medications prior to his dialysis sessions to avoid hypotension.  Follow-up echocardiogram from last year showed normal LVEF, we discussed the results today.  Past Medical History  Diagnosis Date  . Nonischemic cardiomyopathy ([place])     Hypertension and regular alcohol use, LVEF 40-45%  . End stage renal disease ([place])     Hemodialysis on Monday, Wednesday, Friday  . Chronic systolic heart failure ([place])   . Anemia in chronic kidney disease(285.21)   . Type 2 diabetes mellitus ([place])   . Essential hypertension, benign   . Glaucoma   . GERD (gastroesophageal reflux disease)   . Arthritis     Current Outpatient Prescriptions  Medication Sig Dispense Refill  . acetaminophen (TYLENOL) 500 MG tablet Take 1,000 mg by mouth every 6 (six) hours as needed for mild pain.    . B Complex-C-Folic Acid (DIALYVITE TABLET) TABS Take 1 tablet by mouth every other day.     . B-D ULTRAFINE III SHORT PEN 31G X 8 MM MISC     . carvedilol (COREG) 12.5 MG tablet Take 12.5 mg by mouth every morning.     [name] EPIPEN 2-PAK 0.3 MG/0.3ML SOAJ injection Inject 0.3 mg into the muscle once as needed (for bee).     [name] HYDROcodone-acetaminophen (NORCO/VICODIN) 5-325 MG per tablet Take 1 tablet by mouth every 6 (six) hours as needed for  moderate pain. 15 tablet 0  . insulin glargine (LANTUS) 100 UNIT/ML injection Inject 30 Units into the skin at bedtime.     [name] LANTUS SOLOSTAR 100 UNIT/ML Solostar Pen     . meclizine (ANTIVERT) 25 MG tablet Take 25 mg by mouth 3 (three) times daily as needed for dizziness.    [name] omeprazole (PRILOSEC) 40 MG capsule Take 40 mg by mouth daily.      . ramipril (ALTACE) 2.5 MG capsule Take 2.5 mg by mouth daily.    [name] RENVELA 800 MG tablet Take 800 mg by mouth 3 (three) times daily with meals.      No current facility-administered medications for this visit.   Allergies:  Bee venom and Penicillins   Social History: The patient  reports that he has never smoked. He has never used smokeless tobacco. He reports that he drinks about 1.8 oz of alcohol per week. He reports that he does not use illicit drugs.   ROS:  Please see the history of present illness. Otherwise, complete review of systems is positive for none.  All other systems are reviewed and negative.   Physical Exam: VS:  BP 130/88 mmHg  Pulse 74  Ht 6\' 2"  (1.88 m)  Wt 303 lb (137.44 kg)  BMI 38.89 kg/m2  SpO2 96%, BMI Body mass index is 38.89 kg/(m^2).  Wt Readings from Last 3 Encounters:  09/12/15 303 lb (  137.44 kg)  02/08/15 298 lb (135.172 kg)  02/05/15 299 lb (135.626 kg)    Obese male in no acute distress.  HEENT: Conjunctiva and lids normal, oropharynx with poor dentition.  Neck: Supple, no elevated JVP, possible left carotid bruit. Lungs: Decreased but clear overall, nonlabored.  Cardiac: Indistinct PMI, regular rate and rhythm, no S3, soft systolic murmur.  Abdomen: Nontender, bowel sounds present, no hepatomegaly.  Musculoskeletal: No kyphosis.  Extremities: No distal pitting edema. AV fistula noted in the left forearm.  ECG: I personally reviewed the prior tracing from 01/24/2015 which showed sinus rhythm with right bundle branch block and left anterior fascicular block.  Recent Labwork: 01/24/2015: BUN 40*;  Creatinine, Ser 11.70*; Hemoglobin 15.0; Potassium 3.9; Sodium 141   Other Studies Reviewed Today:  Lexiscan Cardiolite April 2014: No diagnostic ST segment changes, overall low risk perfusion study with small, partially reversible lateral apical defect possibly reflecting variable soft tissue attenuation, less likely a small region of ischemia. LVEF was 53% by that study.  Limited echocardiogram 03/07/2015: Study Conclusions  - Procedure narrative: Transthoracic echocardiography. Image quality was adequate. The study was technically difficult, as a result of poor sound wave transmission. Intravenous contrast (Definity) was administered. - Left ventricle: Systolic function was normal. The estimated ejection fraction was in the range of 55% to 60%. - Mitral valve: Mildly calcified annulus. Mildly thickened leaflets  Echocardiogram 02/28/2015: Study Conclusions  - Procedure narrative: Transthoracic echocardiography for left ventricular function evaluation, for right ventricular function evaluation, and for assessment of valvular function. Image quality was poor. The study was technically difficult, as a result of poor acoustic windows and body habitus. - Left ventricle: The cavity size was normal. There was moderate concentric hypertrophy. Systolic function was difficult to assess but appeared grossly normal. Ejection fraction could not be accurately assessed on the basis of this study. Images were inadequate for LV wall motion assessment. Doppler parameters are consistent with abnormal left ventricular relaxation (grade 1 diastolic dysfunction). - Aortic valve: Mildly calcified annulus. - Mitral valve: Mildly calcified annulus.  Impressions:  - Would recommend a repeat limited study with contrast enhancement for a more accurate assessment of left ventricular systolic function and regional wall motion.  Assessment and Plan:  1. Nonischemic  cardiomyopathy with normalization of LVEF on medical therapy. Continue current regimen.  2. Essential hypertension, blood pressure control is adequate today. No changes made to medical regimen.  3. End-stage renal disease on hemodialysis. Keep follow-up with Dr. Lowanda Foster.  Current medicines were reviewed with the patient today.  Disposition: FU with me in 1 year.   Signed, Satira Sark, MD, [facility] 09/12/2015 9:15 AM    [facility] [place], [place], [place] [address] Phone: [phone]; Fax: [phone]

## 2015-09-13 DIAGNOSIS — D509 Iron deficiency anemia, unspecified: Secondary | ICD-10-CM | POA: Diagnosis not present

## 2015-09-13 DIAGNOSIS — Z992 Dependence on renal dialysis: Secondary | ICD-10-CM | POA: Diagnosis not present

## 2015-09-13 DIAGNOSIS — N2581 Secondary hyperparathyroidism of renal origin: Secondary | ICD-10-CM | POA: Diagnosis not present

## 2015-09-13 DIAGNOSIS — N186 End stage renal disease: Secondary | ICD-10-CM | POA: Diagnosis not present

## 2015-09-16 DIAGNOSIS — Z992 Dependence on renal dialysis: Secondary | ICD-10-CM | POA: Diagnosis not present

## 2015-09-16 DIAGNOSIS — N186 End stage renal disease: Secondary | ICD-10-CM | POA: Diagnosis not present

## 2015-09-16 DIAGNOSIS — N2581 Secondary hyperparathyroidism of renal origin: Secondary | ICD-10-CM | POA: Diagnosis not present

## 2015-09-16 DIAGNOSIS — D509 Iron deficiency anemia, unspecified: Secondary | ICD-10-CM | POA: Diagnosis not present

## 2015-09-18 DIAGNOSIS — Z992 Dependence on renal dialysis: Secondary | ICD-10-CM | POA: Diagnosis not present

## 2015-09-18 DIAGNOSIS — D509 Iron deficiency anemia, unspecified: Secondary | ICD-10-CM | POA: Diagnosis not present

## 2015-09-18 DIAGNOSIS — N2581 Secondary hyperparathyroidism of renal origin: Secondary | ICD-10-CM | POA: Diagnosis not present

## 2015-09-18 DIAGNOSIS — N186 End stage renal disease: Secondary | ICD-10-CM | POA: Diagnosis not present

## 2015-09-20 DIAGNOSIS — D509 Iron deficiency anemia, unspecified: Secondary | ICD-10-CM | POA: Diagnosis not present

## 2015-09-20 DIAGNOSIS — Z992 Dependence on renal dialysis: Secondary | ICD-10-CM | POA: Diagnosis not present

## 2015-09-20 DIAGNOSIS — N2581 Secondary hyperparathyroidism of renal origin: Secondary | ICD-10-CM | POA: Diagnosis not present

## 2015-09-20 DIAGNOSIS — N186 End stage renal disease: Secondary | ICD-10-CM | POA: Diagnosis not present

## 2015-09-23 DIAGNOSIS — Z992 Dependence on renal dialysis: Secondary | ICD-10-CM | POA: Diagnosis not present

## 2015-09-23 DIAGNOSIS — D509 Iron deficiency anemia, unspecified: Secondary | ICD-10-CM | POA: Diagnosis not present

## 2015-09-23 DIAGNOSIS — N2581 Secondary hyperparathyroidism of renal origin: Secondary | ICD-10-CM | POA: Diagnosis not present

## 2015-09-23 DIAGNOSIS — N186 End stage renal disease: Secondary | ICD-10-CM | POA: Diagnosis not present

## 2015-09-25 DIAGNOSIS — D509 Iron deficiency anemia, unspecified: Secondary | ICD-10-CM | POA: Diagnosis not present

## 2015-09-25 DIAGNOSIS — Z992 Dependence on renal dialysis: Secondary | ICD-10-CM | POA: Diagnosis not present

## 2015-09-25 DIAGNOSIS — N186 End stage renal disease: Secondary | ICD-10-CM | POA: Diagnosis not present

## 2015-09-25 DIAGNOSIS — N2581 Secondary hyperparathyroidism of renal origin: Secondary | ICD-10-CM | POA: Diagnosis not present

## 2015-09-27 DIAGNOSIS — Z992 Dependence on renal dialysis: Secondary | ICD-10-CM | POA: Diagnosis not present

## 2015-09-27 DIAGNOSIS — N186 End stage renal disease: Secondary | ICD-10-CM | POA: Diagnosis not present

## 2015-09-27 DIAGNOSIS — D509 Iron deficiency anemia, unspecified: Secondary | ICD-10-CM | POA: Diagnosis not present

## 2015-09-27 DIAGNOSIS — N2581 Secondary hyperparathyroidism of renal origin: Secondary | ICD-10-CM | POA: Diagnosis not present

## 2015-09-30 DIAGNOSIS — N186 End stage renal disease: Secondary | ICD-10-CM | POA: Diagnosis not present

## 2015-09-30 DIAGNOSIS — Z992 Dependence on renal dialysis: Secondary | ICD-10-CM | POA: Diagnosis not present

## 2015-09-30 DIAGNOSIS — D509 Iron deficiency anemia, unspecified: Secondary | ICD-10-CM | POA: Diagnosis not present

## 2015-09-30 DIAGNOSIS — N2581 Secondary hyperparathyroidism of renal origin: Secondary | ICD-10-CM | POA: Diagnosis not present

## 2015-10-01 DIAGNOSIS — N186 End stage renal disease: Secondary | ICD-10-CM | POA: Diagnosis not present

## 2015-10-01 DIAGNOSIS — Z992 Dependence on renal dialysis: Secondary | ICD-10-CM | POA: Diagnosis not present

## 2015-10-02 DIAGNOSIS — N2581 Secondary hyperparathyroidism of renal origin: Secondary | ICD-10-CM | POA: Diagnosis not present

## 2015-10-02 DIAGNOSIS — N186 End stage renal disease: Secondary | ICD-10-CM | POA: Diagnosis not present

## 2015-10-02 DIAGNOSIS — D509 Iron deficiency anemia, unspecified: Secondary | ICD-10-CM | POA: Diagnosis not present

## 2015-10-02 DIAGNOSIS — Z992 Dependence on renal dialysis: Secondary | ICD-10-CM | POA: Diagnosis not present

## 2015-10-04 DIAGNOSIS — N186 End stage renal disease: Secondary | ICD-10-CM | POA: Diagnosis not present

## 2015-10-04 DIAGNOSIS — D509 Iron deficiency anemia, unspecified: Secondary | ICD-10-CM | POA: Diagnosis not present

## 2015-10-04 DIAGNOSIS — Z992 Dependence on renal dialysis: Secondary | ICD-10-CM | POA: Diagnosis not present

## 2015-10-04 DIAGNOSIS — N2581 Secondary hyperparathyroidism of renal origin: Secondary | ICD-10-CM | POA: Diagnosis not present

## 2015-10-07 DIAGNOSIS — Z992 Dependence on renal dialysis: Secondary | ICD-10-CM | POA: Diagnosis not present

## 2015-10-07 DIAGNOSIS — N2581 Secondary hyperparathyroidism of renal origin: Secondary | ICD-10-CM | POA: Diagnosis not present

## 2015-10-07 DIAGNOSIS — N186 End stage renal disease: Secondary | ICD-10-CM | POA: Diagnosis not present

## 2015-10-07 DIAGNOSIS — D509 Iron deficiency anemia, unspecified: Secondary | ICD-10-CM | POA: Diagnosis not present

## 2015-10-09 DIAGNOSIS — D509 Iron deficiency anemia, unspecified: Secondary | ICD-10-CM | POA: Diagnosis not present

## 2015-10-09 DIAGNOSIS — N186 End stage renal disease: Secondary | ICD-10-CM | POA: Diagnosis not present

## 2015-10-09 DIAGNOSIS — N2581 Secondary hyperparathyroidism of renal origin: Secondary | ICD-10-CM | POA: Diagnosis not present

## 2015-10-09 DIAGNOSIS — Z992 Dependence on renal dialysis: Secondary | ICD-10-CM | POA: Diagnosis not present

## 2015-10-11 DIAGNOSIS — N186 End stage renal disease: Secondary | ICD-10-CM | POA: Diagnosis not present

## 2015-10-11 DIAGNOSIS — D509 Iron deficiency anemia, unspecified: Secondary | ICD-10-CM | POA: Diagnosis not present

## 2015-10-11 DIAGNOSIS — N2581 Secondary hyperparathyroidism of renal origin: Secondary | ICD-10-CM | POA: Diagnosis not present

## 2015-10-11 DIAGNOSIS — Z992 Dependence on renal dialysis: Secondary | ICD-10-CM | POA: Diagnosis not present

## 2015-10-14 DIAGNOSIS — N186 End stage renal disease: Secondary | ICD-10-CM | POA: Diagnosis not present

## 2015-10-14 DIAGNOSIS — D509 Iron deficiency anemia, unspecified: Secondary | ICD-10-CM | POA: Diagnosis not present

## 2015-10-14 DIAGNOSIS — Z992 Dependence on renal dialysis: Secondary | ICD-10-CM | POA: Diagnosis not present

## 2015-10-14 DIAGNOSIS — N2581 Secondary hyperparathyroidism of renal origin: Secondary | ICD-10-CM | POA: Diagnosis not present

## 2015-10-16 DIAGNOSIS — N2581 Secondary hyperparathyroidism of renal origin: Secondary | ICD-10-CM | POA: Diagnosis not present

## 2015-10-16 DIAGNOSIS — D509 Iron deficiency anemia, unspecified: Secondary | ICD-10-CM | POA: Diagnosis not present

## 2015-10-16 DIAGNOSIS — N186 End stage renal disease: Secondary | ICD-10-CM | POA: Diagnosis not present

## 2015-10-16 DIAGNOSIS — Z992 Dependence on renal dialysis: Secondary | ICD-10-CM | POA: Diagnosis not present

## 2015-10-18 DIAGNOSIS — N2581 Secondary hyperparathyroidism of renal origin: Secondary | ICD-10-CM | POA: Diagnosis not present

## 2015-10-18 DIAGNOSIS — Z992 Dependence on renal dialysis: Secondary | ICD-10-CM | POA: Diagnosis not present

## 2015-10-18 DIAGNOSIS — N186 End stage renal disease: Secondary | ICD-10-CM | POA: Diagnosis not present

## 2015-10-18 DIAGNOSIS — D509 Iron deficiency anemia, unspecified: Secondary | ICD-10-CM | POA: Diagnosis not present

## 2015-10-21 DIAGNOSIS — D509 Iron deficiency anemia, unspecified: Secondary | ICD-10-CM | POA: Diagnosis not present

## 2015-10-21 DIAGNOSIS — N186 End stage renal disease: Secondary | ICD-10-CM | POA: Diagnosis not present

## 2015-10-21 DIAGNOSIS — Z992 Dependence on renal dialysis: Secondary | ICD-10-CM | POA: Diagnosis not present

## 2015-10-21 DIAGNOSIS — N2581 Secondary hyperparathyroidism of renal origin: Secondary | ICD-10-CM | POA: Diagnosis not present

## 2015-10-23 DIAGNOSIS — Z992 Dependence on renal dialysis: Secondary | ICD-10-CM | POA: Diagnosis not present

## 2015-10-23 DIAGNOSIS — D509 Iron deficiency anemia, unspecified: Secondary | ICD-10-CM | POA: Diagnosis not present

## 2015-10-23 DIAGNOSIS — N2581 Secondary hyperparathyroidism of renal origin: Secondary | ICD-10-CM | POA: Diagnosis not present

## 2015-10-23 DIAGNOSIS — N186 End stage renal disease: Secondary | ICD-10-CM | POA: Diagnosis not present

## 2015-10-25 DIAGNOSIS — Z992 Dependence on renal dialysis: Secondary | ICD-10-CM | POA: Diagnosis not present

## 2015-10-25 DIAGNOSIS — D509 Iron deficiency anemia, unspecified: Secondary | ICD-10-CM | POA: Diagnosis not present

## 2015-10-25 DIAGNOSIS — N186 End stage renal disease: Secondary | ICD-10-CM | POA: Diagnosis not present

## 2015-10-25 DIAGNOSIS — N2581 Secondary hyperparathyroidism of renal origin: Secondary | ICD-10-CM | POA: Diagnosis not present

## 2015-10-28 DIAGNOSIS — Z992 Dependence on renal dialysis: Secondary | ICD-10-CM | POA: Diagnosis not present

## 2015-10-28 DIAGNOSIS — N186 End stage renal disease: Secondary | ICD-10-CM | POA: Diagnosis not present

## 2015-10-28 DIAGNOSIS — Z794 Long term (current) use of insulin: Secondary | ICD-10-CM | POA: Diagnosis not present

## 2015-10-28 DIAGNOSIS — D509 Iron deficiency anemia, unspecified: Secondary | ICD-10-CM | POA: Diagnosis not present

## 2015-10-28 DIAGNOSIS — E119 Type 2 diabetes mellitus without complications: Secondary | ICD-10-CM | POA: Diagnosis not present

## 2015-10-28 DIAGNOSIS — N2581 Secondary hyperparathyroidism of renal origin: Secondary | ICD-10-CM | POA: Diagnosis not present

## 2015-10-29 DIAGNOSIS — Z1389 Encounter for screening for other disorder: Secondary | ICD-10-CM | POA: Diagnosis not present

## 2015-10-29 DIAGNOSIS — Z0000 Encounter for general adult medical examination without abnormal findings: Secondary | ICD-10-CM | POA: Diagnosis not present

## 2015-10-29 DIAGNOSIS — Z6839 Body mass index (BMI) 39.0-39.9, adult: Secondary | ICD-10-CM | POA: Diagnosis not present

## 2015-10-30 DIAGNOSIS — D509 Iron deficiency anemia, unspecified: Secondary | ICD-10-CM | POA: Diagnosis not present

## 2015-10-30 DIAGNOSIS — Z992 Dependence on renal dialysis: Secondary | ICD-10-CM | POA: Diagnosis not present

## 2015-10-30 DIAGNOSIS — N2581 Secondary hyperparathyroidism of renal origin: Secondary | ICD-10-CM | POA: Diagnosis not present

## 2015-10-30 DIAGNOSIS — N186 End stage renal disease: Secondary | ICD-10-CM | POA: Diagnosis not present

## 2015-11-01 DIAGNOSIS — N2581 Secondary hyperparathyroidism of renal origin: Secondary | ICD-10-CM | POA: Diagnosis not present

## 2015-11-01 DIAGNOSIS — D509 Iron deficiency anemia, unspecified: Secondary | ICD-10-CM | POA: Diagnosis not present

## 2015-11-01 DIAGNOSIS — N186 End stage renal disease: Secondary | ICD-10-CM | POA: Diagnosis not present

## 2015-11-01 DIAGNOSIS — Z992 Dependence on renal dialysis: Secondary | ICD-10-CM | POA: Diagnosis not present

## 2015-11-04 DIAGNOSIS — N2581 Secondary hyperparathyroidism of renal origin: Secondary | ICD-10-CM | POA: Diagnosis not present

## 2015-11-04 DIAGNOSIS — N186 End stage renal disease: Secondary | ICD-10-CM | POA: Diagnosis not present

## 2015-11-04 DIAGNOSIS — Z23 Encounter for immunization: Secondary | ICD-10-CM | POA: Diagnosis not present

## 2015-11-04 DIAGNOSIS — D509 Iron deficiency anemia, unspecified: Secondary | ICD-10-CM | POA: Diagnosis not present

## 2015-11-04 DIAGNOSIS — Z992 Dependence on renal dialysis: Secondary | ICD-10-CM | POA: Diagnosis not present

## 2015-11-05 DIAGNOSIS — Z1389 Encounter for screening for other disorder: Secondary | ICD-10-CM | POA: Diagnosis not present

## 2015-11-05 DIAGNOSIS — E1129 Type 2 diabetes mellitus with other diabetic kidney complication: Secondary | ICD-10-CM | POA: Diagnosis not present

## 2015-11-05 DIAGNOSIS — G4700 Insomnia, unspecified: Secondary | ICD-10-CM | POA: Diagnosis not present

## 2015-11-05 DIAGNOSIS — E1165 Type 2 diabetes mellitus with hyperglycemia: Secondary | ICD-10-CM | POA: Diagnosis not present

## 2015-11-05 DIAGNOSIS — E782 Mixed hyperlipidemia: Secondary | ICD-10-CM | POA: Diagnosis not present

## 2015-11-05 DIAGNOSIS — I10 Essential (primary) hypertension: Secondary | ICD-10-CM | POA: Diagnosis not present

## 2015-11-05 DIAGNOSIS — Z6839 Body mass index (BMI) 39.0-39.9, adult: Secondary | ICD-10-CM | POA: Diagnosis not present

## 2015-11-05 DIAGNOSIS — E109 Type 1 diabetes mellitus without complications: Secondary | ICD-10-CM | POA: Diagnosis not present

## 2015-11-06 DIAGNOSIS — D509 Iron deficiency anemia, unspecified: Secondary | ICD-10-CM | POA: Diagnosis not present

## 2015-11-06 DIAGNOSIS — Z992 Dependence on renal dialysis: Secondary | ICD-10-CM | POA: Diagnosis not present

## 2015-11-06 DIAGNOSIS — N2581 Secondary hyperparathyroidism of renal origin: Secondary | ICD-10-CM | POA: Diagnosis not present

## 2015-11-06 DIAGNOSIS — N186 End stage renal disease: Secondary | ICD-10-CM | POA: Diagnosis not present

## 2015-11-06 DIAGNOSIS — Z23 Encounter for immunization: Secondary | ICD-10-CM | POA: Diagnosis not present

## 2015-11-08 DIAGNOSIS — Z23 Encounter for immunization: Secondary | ICD-10-CM | POA: Diagnosis not present

## 2015-11-08 DIAGNOSIS — D509 Iron deficiency anemia, unspecified: Secondary | ICD-10-CM | POA: Diagnosis not present

## 2015-11-08 DIAGNOSIS — Z992 Dependence on renal dialysis: Secondary | ICD-10-CM | POA: Diagnosis not present

## 2015-11-08 DIAGNOSIS — N2581 Secondary hyperparathyroidism of renal origin: Secondary | ICD-10-CM | POA: Diagnosis not present

## 2015-11-08 DIAGNOSIS — N186 End stage renal disease: Secondary | ICD-10-CM | POA: Diagnosis not present

## 2015-11-11 DIAGNOSIS — Z23 Encounter for immunization: Secondary | ICD-10-CM | POA: Diagnosis not present

## 2015-11-11 DIAGNOSIS — N2581 Secondary hyperparathyroidism of renal origin: Secondary | ICD-10-CM | POA: Diagnosis not present

## 2015-11-11 DIAGNOSIS — D509 Iron deficiency anemia, unspecified: Secondary | ICD-10-CM | POA: Diagnosis not present

## 2015-11-11 DIAGNOSIS — N186 End stage renal disease: Secondary | ICD-10-CM | POA: Diagnosis not present

## 2015-11-11 DIAGNOSIS — Z992 Dependence on renal dialysis: Secondary | ICD-10-CM | POA: Diagnosis not present

## 2015-11-13 DIAGNOSIS — Z23 Encounter for immunization: Secondary | ICD-10-CM | POA: Diagnosis not present

## 2015-11-13 DIAGNOSIS — Z992 Dependence on renal dialysis: Secondary | ICD-10-CM | POA: Diagnosis not present

## 2015-11-13 DIAGNOSIS — N2581 Secondary hyperparathyroidism of renal origin: Secondary | ICD-10-CM | POA: Diagnosis not present

## 2015-11-13 DIAGNOSIS — D509 Iron deficiency anemia, unspecified: Secondary | ICD-10-CM | POA: Diagnosis not present

## 2015-11-13 DIAGNOSIS — N186 End stage renal disease: Secondary | ICD-10-CM | POA: Diagnosis not present

## 2015-11-15 DIAGNOSIS — D509 Iron deficiency anemia, unspecified: Secondary | ICD-10-CM | POA: Diagnosis not present

## 2015-11-15 DIAGNOSIS — N186 End stage renal disease: Secondary | ICD-10-CM | POA: Diagnosis not present

## 2015-11-15 DIAGNOSIS — Z23 Encounter for immunization: Secondary | ICD-10-CM | POA: Diagnosis not present

## 2015-11-15 DIAGNOSIS — N2581 Secondary hyperparathyroidism of renal origin: Secondary | ICD-10-CM | POA: Diagnosis not present

## 2015-11-15 DIAGNOSIS — Z992 Dependence on renal dialysis: Secondary | ICD-10-CM | POA: Diagnosis not present

## 2015-11-18 DIAGNOSIS — D509 Iron deficiency anemia, unspecified: Secondary | ICD-10-CM | POA: Diagnosis not present

## 2015-11-18 DIAGNOSIS — Z992 Dependence on renal dialysis: Secondary | ICD-10-CM | POA: Diagnosis not present

## 2015-11-18 DIAGNOSIS — Z23 Encounter for immunization: Secondary | ICD-10-CM | POA: Diagnosis not present

## 2015-11-18 DIAGNOSIS — N2581 Secondary hyperparathyroidism of renal origin: Secondary | ICD-10-CM | POA: Diagnosis not present

## 2015-11-18 DIAGNOSIS — N186 End stage renal disease: Secondary | ICD-10-CM | POA: Diagnosis not present

## 2015-11-20 DIAGNOSIS — Z992 Dependence on renal dialysis: Secondary | ICD-10-CM | POA: Diagnosis not present

## 2015-11-20 DIAGNOSIS — D509 Iron deficiency anemia, unspecified: Secondary | ICD-10-CM | POA: Diagnosis not present

## 2015-11-20 DIAGNOSIS — N2581 Secondary hyperparathyroidism of renal origin: Secondary | ICD-10-CM | POA: Diagnosis not present

## 2015-11-20 DIAGNOSIS — Z23 Encounter for immunization: Secondary | ICD-10-CM | POA: Diagnosis not present

## 2015-11-20 DIAGNOSIS — N186 End stage renal disease: Secondary | ICD-10-CM | POA: Diagnosis not present

## 2015-11-22 DIAGNOSIS — Z23 Encounter for immunization: Secondary | ICD-10-CM | POA: Diagnosis not present

## 2015-11-22 DIAGNOSIS — N186 End stage renal disease: Secondary | ICD-10-CM | POA: Diagnosis not present

## 2015-11-22 DIAGNOSIS — D509 Iron deficiency anemia, unspecified: Secondary | ICD-10-CM | POA: Diagnosis not present

## 2015-11-22 DIAGNOSIS — N2581 Secondary hyperparathyroidism of renal origin: Secondary | ICD-10-CM | POA: Diagnosis not present

## 2015-11-22 DIAGNOSIS — Z992 Dependence on renal dialysis: Secondary | ICD-10-CM | POA: Diagnosis not present

## 2015-11-25 DIAGNOSIS — N186 End stage renal disease: Secondary | ICD-10-CM | POA: Diagnosis not present

## 2015-11-25 DIAGNOSIS — Z992 Dependence on renal dialysis: Secondary | ICD-10-CM | POA: Diagnosis not present

## 2015-11-25 DIAGNOSIS — Z23 Encounter for immunization: Secondary | ICD-10-CM | POA: Diagnosis not present

## 2015-11-25 DIAGNOSIS — D509 Iron deficiency anemia, unspecified: Secondary | ICD-10-CM | POA: Diagnosis not present

## 2015-11-25 DIAGNOSIS — N2581 Secondary hyperparathyroidism of renal origin: Secondary | ICD-10-CM | POA: Diagnosis not present

## 2015-11-27 DIAGNOSIS — N2581 Secondary hyperparathyroidism of renal origin: Secondary | ICD-10-CM | POA: Diagnosis not present

## 2015-11-27 DIAGNOSIS — Z992 Dependence on renal dialysis: Secondary | ICD-10-CM | POA: Diagnosis not present

## 2015-11-27 DIAGNOSIS — Z23 Encounter for immunization: Secondary | ICD-10-CM | POA: Diagnosis not present

## 2015-11-27 DIAGNOSIS — D509 Iron deficiency anemia, unspecified: Secondary | ICD-10-CM | POA: Diagnosis not present

## 2015-11-27 DIAGNOSIS — N186 End stage renal disease: Secondary | ICD-10-CM | POA: Diagnosis not present

## 2015-11-29 ENCOUNTER — Other Ambulatory Visit: Payer: Self-pay | Admitting: Family Medicine

## 2015-11-29 DIAGNOSIS — Z23 Encounter for immunization: Secondary | ICD-10-CM | POA: Diagnosis not present

## 2015-11-29 DIAGNOSIS — N186 End stage renal disease: Secondary | ICD-10-CM | POA: Diagnosis not present

## 2015-11-29 DIAGNOSIS — Z992 Dependence on renal dialysis: Secondary | ICD-10-CM | POA: Diagnosis not present

## 2015-11-29 DIAGNOSIS — N2581 Secondary hyperparathyroidism of renal origin: Secondary | ICD-10-CM | POA: Diagnosis not present

## 2015-11-29 DIAGNOSIS — D509 Iron deficiency anemia, unspecified: Secondary | ICD-10-CM | POA: Diagnosis not present

## 2015-11-29 DIAGNOSIS — Z4931 Encounter for adequacy testing for hemodialysis: Secondary | ICD-10-CM

## 2015-12-01 DIAGNOSIS — N186 End stage renal disease: Secondary | ICD-10-CM | POA: Diagnosis not present

## 2015-12-01 DIAGNOSIS — Z992 Dependence on renal dialysis: Secondary | ICD-10-CM | POA: Diagnosis not present

## 2015-12-02 DIAGNOSIS — N186 End stage renal disease: Secondary | ICD-10-CM | POA: Diagnosis not present

## 2015-12-02 DIAGNOSIS — N2581 Secondary hyperparathyroidism of renal origin: Secondary | ICD-10-CM | POA: Diagnosis not present

## 2015-12-02 DIAGNOSIS — Z992 Dependence on renal dialysis: Secondary | ICD-10-CM | POA: Diagnosis not present

## 2015-12-02 DIAGNOSIS — D509 Iron deficiency anemia, unspecified: Secondary | ICD-10-CM | POA: Diagnosis not present

## 2015-12-04 DIAGNOSIS — D509 Iron deficiency anemia, unspecified: Secondary | ICD-10-CM | POA: Diagnosis not present

## 2015-12-04 DIAGNOSIS — Z992 Dependence on renal dialysis: Secondary | ICD-10-CM | POA: Diagnosis not present

## 2015-12-04 DIAGNOSIS — N2581 Secondary hyperparathyroidism of renal origin: Secondary | ICD-10-CM | POA: Diagnosis not present

## 2015-12-04 DIAGNOSIS — N186 End stage renal disease: Secondary | ICD-10-CM | POA: Diagnosis not present

## 2015-12-06 ENCOUNTER — Encounter: Payer: Self-pay | Admitting: Vascular Surgery

## 2015-12-07 DIAGNOSIS — N2581 Secondary hyperparathyroidism of renal origin: Secondary | ICD-10-CM | POA: Diagnosis not present

## 2015-12-07 DIAGNOSIS — Z992 Dependence on renal dialysis: Secondary | ICD-10-CM | POA: Diagnosis not present

## 2015-12-07 DIAGNOSIS — N186 End stage renal disease: Secondary | ICD-10-CM | POA: Diagnosis not present

## 2015-12-07 DIAGNOSIS — D509 Iron deficiency anemia, unspecified: Secondary | ICD-10-CM | POA: Diagnosis not present

## 2015-12-09 ENCOUNTER — Ambulatory Visit (HOSPITAL_COMMUNITY)
Admission: RE | Admit: 2015-12-09 | Discharge: 2015-12-09 | Disposition: A | Discharge status: Home / Self Care | Payer: Medicare Other | Source: Ambulatory Visit | Attending: Vascular Surgery | Admitting: Vascular Surgery

## 2015-12-09 DIAGNOSIS — Z4931 Encounter for adequacy testing for hemodialysis: Secondary | ICD-10-CM | POA: Diagnosis not present

## 2015-12-09 DIAGNOSIS — N2581 Secondary hyperparathyroidism of renal origin: Secondary | ICD-10-CM | POA: Diagnosis not present

## 2015-12-09 DIAGNOSIS — N186 End stage renal disease: Secondary | ICD-10-CM | POA: Insufficient documentation

## 2015-12-09 DIAGNOSIS — E1122 Type 2 diabetes mellitus with diabetic chronic kidney disease: Secondary | ICD-10-CM | POA: Diagnosis not present

## 2015-12-09 DIAGNOSIS — Z992 Dependence on renal dialysis: Secondary | ICD-10-CM | POA: Insufficient documentation

## 2015-12-09 DIAGNOSIS — I5022 Chronic systolic (congestive) heart failure: Secondary | ICD-10-CM | POA: Diagnosis not present

## 2015-12-09 DIAGNOSIS — I132 Hypertensive heart and chronic kidney disease with heart failure and with stage 5 chronic kidney disease, or end stage renal disease: Secondary | ICD-10-CM | POA: Insufficient documentation

## 2015-12-09 DIAGNOSIS — K219 Gastro-esophageal reflux disease without esophagitis: Secondary | ICD-10-CM | POA: Diagnosis not present

## 2015-12-09 DIAGNOSIS — D509 Iron deficiency anemia, unspecified: Secondary | ICD-10-CM | POA: Diagnosis not present

## 2015-12-11 DIAGNOSIS — Z992 Dependence on renal dialysis: Secondary | ICD-10-CM | POA: Diagnosis not present

## 2015-12-11 DIAGNOSIS — N186 End stage renal disease: Secondary | ICD-10-CM | POA: Diagnosis not present

## 2015-12-11 DIAGNOSIS — N2581 Secondary hyperparathyroidism of renal origin: Secondary | ICD-10-CM | POA: Diagnosis not present

## 2015-12-11 DIAGNOSIS — D509 Iron deficiency anemia, unspecified: Secondary | ICD-10-CM | POA: Diagnosis not present

## 2015-12-13 ENCOUNTER — Encounter: Payer: Self-pay | Admitting: Vascular Surgery

## 2015-12-13 ENCOUNTER — Ambulatory Visit (INDEPENDENT_AMBULATORY_CARE_PROVIDER_SITE_OTHER): Payer: Medicare Other | Admitting: Vascular Surgery

## 2015-12-13 DIAGNOSIS — D509 Iron deficiency anemia, unspecified: Secondary | ICD-10-CM | POA: Diagnosis not present

## 2015-12-13 DIAGNOSIS — N186 End stage renal disease: Secondary | ICD-10-CM

## 2015-12-13 DIAGNOSIS — N2581 Secondary hyperparathyroidism of renal origin: Secondary | ICD-10-CM | POA: Diagnosis not present

## 2015-12-13 DIAGNOSIS — Z992 Dependence on renal dialysis: Secondary | ICD-10-CM | POA: Diagnosis not present

## 2015-12-13 NOTE — Progress Notes (Signed)
Referring Physician: Sharilyn Sites, MD  Patient name: Jacob [name] MRN: [id number] DOB: 09/05/1950 Sex: male  REASON FOR CONSULT: right radio-cephalic poorly functioning fistula  HPI: Jacob [name] is a 65 y.o. male,  He was sent here today for poorly functioning right forearm av fistula.  This av fistula was last revised on 01/03/2016 by Dr. Bridgett Larsson for sclerotic distal cephalic vein causing narrowing in the fistula.    He is here for evaluation and possible intervention.  Other medical problems include DM managed with insulin  Past Medical History  Diagnosis Date  . Nonischemic cardiomyopathy ([place])     Hypertension and regular alcohol use, LVEF 40-45%  . End stage renal disease ([place])     Hemodialysis on Monday, Wednesday, Friday  . Chronic systolic heart failure ([place])   . Anemia in chronic kidney disease(285.21)   . Type 2 diabetes mellitus ([place])   . Essential hypertension, benign   . Glaucoma   . GERD (gastroesophageal reflux disease)   . Arthritis    Past Surgical History  Procedure Laterality Date  . Lumbar spine surgery    . Colonoscopy      Hx: of  . Av fistula placement      Hx: of  . Ligation of arteriovenous  fistula Left 05/04/2013    Procedure: LIGATION OF LEFT RADIAL CEPHALIC ARTERIOVENOUS  FISTULA;  Surgeon: Conrad [place], MD;  Location: [place];  Service: Vascular;  Laterality: Left;  Ultrasound guided  . Insertion of dialysis catheter Right 05/04/2013    Procedure: INSERTION OF DIALYSIS CATHETER;  Surgeon: Conrad Hart, MD;  Location: [place];  Service: Vascular;  Laterality: Right;  Ultrasound guided  . Fistulogram N/A 05/04/2013    Procedure: CENTRAL VENOGRAM;  Surgeon: Conrad Farmingdale, MD;  Location: [place];  Service: Vascular;  Laterality: N/A;  . Av fistula placement Right 05/04/2013    Procedure: ARTERIOVENOUS (AV) FISTULA CREATION- RIGHT ARM;  Surgeon: Conrad Plainview, MD;  Location: [place];  Service: Vascular;  Laterality: Right;  Ultrasound guided  . Revison of  arteriovenous fistula Right 01/02/2014    Procedure: REVISON OF ARTERIOVENOUS FISTULA ANASTOMOSIS;  Surgeon: Conrad Roodhouse, MD;  Location: [place];  Service: Vascular;  Laterality: Right;  . Shuntogram N/A 11/07/2013    Procedure: Fistulogram;  Surgeon: [name], MD;  Location: [facility] CATH LAB;  Service: Cardiovascular;  Laterality: N/A;  . Cataract extraction w/phaco Left 07/16/2014    Procedure: CATARACT EXTRACTION PHACO AND INTRAOCULAR LENS PLACEMENT (IOC);  Surgeon: [name], MD;  Location: [facility];  Service: Ophthalmology;  Laterality: Left;  CDE:8.86  . Cataract extraction w/phaco Right 07/30/2014    Procedure: CATARACT EXTRACTION PHACO AND INTRAOCULAR LENS PLACEMENT (IOC);  Surgeon: [name], MD;  Location: [facility];  Service: Ophthalmology;  Laterality: Right;  CDE 8.99  . Peripheral vascular catheterization N/A 01/24/2015    Procedure: Fistulagram;  Surgeon: Conrad Havana, MD;  Location: [place] CV LAB;  Service: Cardiovascular;  Laterality: N/A;    Family History  Problem Relation Age of Onset  . Heart attack Brother     61  . Heart disease Brother     before age 27  . Hypertension Brother   . Heart attack Brother     5  . Heart attack Brother     72  . Diabetes Mother   . Hypertension Mother   . Heart disease Mother   . Heart disease Father   . Hypertension  Father   . Other Father     amputation  . Hypertension Sister   . Heart disease Sister   . Vision loss Maternal Uncle     SOCIAL HISTORY: Social History   Social History  . Marital Status: Single    Spouse Name: N/A  . Number of Children: N/A  . Years of Education: N/A   Occupational History  . Not on file.   Social History Main Topics  . Smoking status: Never Smoker   . Smokeless tobacco: Never Used  . Alcohol Use: 1.8 oz/week    3 Shots of liquor per week     Comment: Regular use for years, reports at least a pint per week  . Drug Use: No  . Sexual Activity: Yes    Birth Control/ Protection: None    Other Topics Concern  . Not on file   Social History Narrative    Allergies  Allergen Reactions  . Bee Venom Anaphylaxis  . Penicillins Swelling    Swelling of throat and tongue    Current Outpatient Prescriptions  Medication Sig Dispense Refill  . acetaminophen (TYLENOL) 500 MG tablet Take 1,000 mg by mouth every 6 (six) hours as needed for mild pain.    . B Complex-C-Folic Acid (DIALYVITE TABLET) TABS Take 1 tablet by mouth every other day.     . B-D ULTRAFINE III SHORT PEN 31G X 8 MM MISC     . carvedilol (COREG) 12.5 MG tablet Take 12.5 mg by mouth every morning.     [name] EPIPEN 2-PAK 0.3 MG/0.3ML SOAJ injection Inject 0.3 mg into the muscle once as needed (for bee).     [name] HYDROcodone-acetaminophen (NORCO/VICODIN) 5-325 MG per tablet Take 1 tablet by mouth every 6 (six) hours as needed for moderate pain. 15 tablet 0  . insulin glargine (LANTUS) 100 UNIT/ML injection Inject 30 Units into the skin at bedtime.     [name] LANTUS SOLOSTAR 100 UNIT/ML Solostar Pen     . meclizine (ANTIVERT) 25 MG tablet Take 25 mg by mouth 3 (three) times daily as needed for dizziness.    [name] omeprazole (PRILOSEC) 40 MG capsule Take 40 mg by mouth daily.      . ramipril (ALTACE) 2.5 MG capsule Take 2.5 mg by mouth daily.    [name] RENVELA 800 MG tablet Take 800 mg by mouth 3 (three) times daily with meals.      No current facility-administered medications for this visit.    ROS:   General:  No weight loss, Fever, chills  HEENT: No recent headaches, no nasal bleeding, no visual changes, no sore throat  Neurologic: No dizziness, blackouts, seizures. No recent symptoms of stroke or mini- stroke. No recent episodes of slurred speech, or temporary blindness.  Cardiac: No recent episodes of chest pain/pressure, no shortness of breath at rest.  No shortness of breath with exertion.  Denies history of atrial fibrillation or irregular heartbeat  Vascular: No history of rest pain in feet.  No history of  claudication.  No history of non-healing ulcer, No history of DVT   Pulmonary: No home oxygen, no productive cough, no hemoptysis,  No asthma or wheezing  Musculoskeletal:  [ ]  Arthritis, [ ]  Low back pain,  [ ]  Joint pain  Hematologic:No history of hypercoagulable state.  No history of easy bleeding.  No history of anemia  Gastrointestinal: No hematochezia or melena,  No gastroesophageal reflux, no trouble swallowing  Urinary: [ ]  chronic Kidney disease, [ ]   on HD - [ ]  MWF or [ ]  TTHS, [ ]  Burning with urination, [ ]  Frequent urination, [ ]  Difficulty urinating;   Skin: No rashes  Psychological: No history of anxiety,  No history of depression   Physical Examination  Filed Vitals:   12/13/15 1512  BP: 138/76  Pulse: 80  Height: 6\' 2"  (1.88 m)  Weight: 304 lb 4.8 oz (138.03 kg)  SpO2: 94%    Body mass index is 39.05 kg/(m^2).  General:  Alert and oriented, no acute distress HEENT: Normal Neck: No bruit or JVD Pulmonary: Clear to auscultation bilaterally Cardiac: Regular Rate and Rhythm without murmur Abdomen: Soft, non-tender, non-distended, no mass, no scars Skin: No rash Extremity Pulses:  2+ radial, brachial, femoral, dorsalis pedis, posterior tibial pulses bilaterally Musculoskeletal: No deformity or edema  Neurologic: Upper and lower extremity motor 5/5 and symmetric  DATA:  Fistula duplex Distal forearm stenosis 556 cm/s   ASSESSMENT:   Imminent occlusion of R RC AVF  PLAN:   We will ask IR to perform a fistulogram with possible intervention to try and salvage the fistula.   We will arrange the procedure.   Theda Sers, [facility] PA-C [facility] [place] Office: [phone]  The patient was seen in conjunction with Dr. Bridgett Larsson  Addendum  I have independently interviewed and examined the patient, and I agree with the physician assistant's findings.  Pt's R RC AVF has no thrill and bruit has a low volume high pitch blowing  sound, c/w imminent occlusion.  Unfortunately, no PV or IR space this coming week so while have to refer the patient to IR for R arm fistulogram, possible intervention.  [name], MD [facility] [place] Office: [phone] Pager: [phone]  12/13/2015, 4:27 PM

## 2015-12-16 ENCOUNTER — Other Ambulatory Visit: Payer: Self-pay | Admitting: Vascular Surgery

## 2015-12-16 DIAGNOSIS — N2581 Secondary hyperparathyroidism of renal origin: Secondary | ICD-10-CM | POA: Diagnosis not present

## 2015-12-16 DIAGNOSIS — N186 End stage renal disease: Secondary | ICD-10-CM

## 2015-12-16 DIAGNOSIS — Z992 Dependence on renal dialysis: Secondary | ICD-10-CM | POA: Diagnosis not present

## 2015-12-16 DIAGNOSIS — D509 Iron deficiency anemia, unspecified: Secondary | ICD-10-CM | POA: Diagnosis not present

## 2015-12-17 ENCOUNTER — Ambulatory Visit (HOSPITAL_COMMUNITY)
Admission: RE | Admit: 2015-12-17 | Discharge: 2015-12-17 | Disposition: A | Discharge status: Home / Self Care | Payer: Medicare Other | Source: Ambulatory Visit | Attending: Vascular Surgery | Admitting: Vascular Surgery

## 2015-12-17 ENCOUNTER — Other Ambulatory Visit: Payer: Self-pay | Admitting: Vascular Surgery

## 2015-12-17 DIAGNOSIS — T82858A Stenosis of vascular prosthetic devices, implants and grafts, initial encounter: Secondary | ICD-10-CM | POA: Insufficient documentation

## 2015-12-17 DIAGNOSIS — Z992 Dependence on renal dialysis: Secondary | ICD-10-CM | POA: Insufficient documentation

## 2015-12-17 DIAGNOSIS — Y832 Surgical operation with anastomosis, bypass or graft as the cause of abnormal reaction of the patient, or of later complication, without mention of misadventure at the time of the procedure: Secondary | ICD-10-CM | POA: Insufficient documentation

## 2015-12-17 DIAGNOSIS — N186 End stage renal disease: Secondary | ICD-10-CM | POA: Insufficient documentation

## 2015-12-17 DIAGNOSIS — T82898A Other specified complication of vascular prosthetic devices, implants and grafts, initial encounter: Secondary | ICD-10-CM | POA: Diagnosis not present

## 2015-12-17 MED ORDER — IOPAMIDOL (ISOVUE-300) INJECTION 61%
INTRAVENOUS | Status: AC
  Administered 2015-12-17: 32 mL
  Filled 2015-12-17: qty 100

## 2015-12-17 MED ORDER — LIDOCAINE HCL 1 % IJ SOLN
INTRAMUSCULAR | Status: AC
  Filled 2015-12-17: qty 20

## 2015-12-17 NOTE — Procedures (Signed)
RFA AVFistulogram No comp Stable Focal severe Radial arterial stenosis at wrist rec surgical revision of the AV anastomosis

## 2015-12-18 DIAGNOSIS — N186 End stage renal disease: Secondary | ICD-10-CM | POA: Diagnosis not present

## 2015-12-18 DIAGNOSIS — N2581 Secondary hyperparathyroidism of renal origin: Secondary | ICD-10-CM | POA: Diagnosis not present

## 2015-12-18 DIAGNOSIS — Z992 Dependence on renal dialysis: Secondary | ICD-10-CM | POA: Diagnosis not present

## 2015-12-18 DIAGNOSIS — D509 Iron deficiency anemia, unspecified: Secondary | ICD-10-CM | POA: Diagnosis not present

## 2015-12-20 DIAGNOSIS — D509 Iron deficiency anemia, unspecified: Secondary | ICD-10-CM | POA: Diagnosis not present

## 2015-12-20 DIAGNOSIS — Z992 Dependence on renal dialysis: Secondary | ICD-10-CM | POA: Diagnosis not present

## 2015-12-20 DIAGNOSIS — N186 End stage renal disease: Secondary | ICD-10-CM | POA: Diagnosis not present

## 2015-12-20 DIAGNOSIS — N2581 Secondary hyperparathyroidism of renal origin: Secondary | ICD-10-CM | POA: Diagnosis not present

## 2015-12-23 DIAGNOSIS — D509 Iron deficiency anemia, unspecified: Secondary | ICD-10-CM | POA: Diagnosis not present

## 2015-12-23 DIAGNOSIS — Z992 Dependence on renal dialysis: Secondary | ICD-10-CM | POA: Diagnosis not present

## 2015-12-23 DIAGNOSIS — N2581 Secondary hyperparathyroidism of renal origin: Secondary | ICD-10-CM | POA: Diagnosis not present

## 2015-12-23 DIAGNOSIS — N186 End stage renal disease: Secondary | ICD-10-CM | POA: Diagnosis not present

## 2015-12-25 ENCOUNTER — Other Ambulatory Visit: Payer: Self-pay | Admitting: *Deleted

## 2015-12-25 DIAGNOSIS — D509 Iron deficiency anemia, unspecified: Secondary | ICD-10-CM | POA: Diagnosis not present

## 2015-12-25 DIAGNOSIS — Z992 Dependence on renal dialysis: Secondary | ICD-10-CM | POA: Diagnosis not present

## 2015-12-25 DIAGNOSIS — N186 End stage renal disease: Secondary | ICD-10-CM | POA: Diagnosis not present

## 2015-12-25 DIAGNOSIS — N2581 Secondary hyperparathyroidism of renal origin: Secondary | ICD-10-CM | POA: Diagnosis not present

## 2015-12-27 DIAGNOSIS — D509 Iron deficiency anemia, unspecified: Secondary | ICD-10-CM | POA: Diagnosis not present

## 2015-12-27 DIAGNOSIS — Z992 Dependence on renal dialysis: Secondary | ICD-10-CM | POA: Diagnosis not present

## 2015-12-27 DIAGNOSIS — N2581 Secondary hyperparathyroidism of renal origin: Secondary | ICD-10-CM | POA: Diagnosis not present

## 2015-12-27 DIAGNOSIS — N186 End stage renal disease: Secondary | ICD-10-CM | POA: Diagnosis not present

## 2015-12-30 DIAGNOSIS — N2581 Secondary hyperparathyroidism of renal origin: Secondary | ICD-10-CM | POA: Diagnosis not present

## 2015-12-30 DIAGNOSIS — Z992 Dependence on renal dialysis: Secondary | ICD-10-CM | POA: Diagnosis not present

## 2015-12-30 DIAGNOSIS — D509 Iron deficiency anemia, unspecified: Secondary | ICD-10-CM | POA: Diagnosis not present

## 2015-12-30 DIAGNOSIS — N186 End stage renal disease: Secondary | ICD-10-CM | POA: Diagnosis not present

## 2015-12-31 ENCOUNTER — Encounter (HOSPITAL_COMMUNITY): Payer: Self-pay | Admitting: *Deleted

## 2015-12-31 NOTE — Progress Notes (Signed)
   12/31/15 1931  OBSTRUCTIVE SLEEP APNEA  Have you ever been diagnosed with sleep apnea through a sleep study? No  Do you snore loudly (loud enough to be heard through closed doors)?  0  Do you often feel tired, fatigued, or sleepy during the daytime (such as falling asleep during driving or talking to someone)? 1  Has anyone observed you stop breathing during your sleep? 0  Do you have, or are you being treated for high blood pressure? 1  BMI more than 35 kg/m2? 1  Age > 50 (1-yes) 1  Neck circumference greater than:Male 16 inches or larger, Male 17inches or larger? 1  Male Gender (Yes=1) 1  Obstructive Sleep Apnea Score 6  Score 5 or greater  Results sent to PCP

## 2015-12-31 NOTE — Progress Notes (Addendum)
  Jacob [name] denies chest pain, has shortness of breath, but not at rest. "When I need dialysis -sometimes."  Cardiologist Patient reported CBG run 140s- less than 200. Cardiologist is Dr Domenic Polite, last office was 09/12/15, follow up in 1 year. Jacob [name] reported that he thinks he had a stroke last year; symptoms right sided weakness.  Patient was seen in [facility] ED.  "The scan did not show a Stroke."  I still have weakness in my right shoulder.  I instructed patient to check CBG to check CBG and if it is less than 70 to treat it with Glucose Gel, Glucose tablets or 1/2 cup of clear juice like apple juice or cranberry juice, or 1/2 cup of regular soda. (not cream soda). I instructed patient to recheck CBG in 15 minutes and if CBG is not greater than 70, to  Call [phone] (pre- op). If it is before pre-op opens to retreat as before and recheck CBG in 15 minutes. I told patient to make note of time that liquid is taken and amount, that surgical time may have to be adjusted.

## 2016-01-01 DIAGNOSIS — N2581 Secondary hyperparathyroidism of renal origin: Secondary | ICD-10-CM | POA: Diagnosis not present

## 2016-01-01 DIAGNOSIS — N186 End stage renal disease: Secondary | ICD-10-CM | POA: Diagnosis not present

## 2016-01-01 DIAGNOSIS — D509 Iron deficiency anemia, unspecified: Secondary | ICD-10-CM | POA: Diagnosis not present

## 2016-01-01 DIAGNOSIS — Z992 Dependence on renal dialysis: Secondary | ICD-10-CM | POA: Diagnosis not present

## 2016-01-01 MED ORDER — VANCOMYCIN HCL 10 G IV SOLR
1500.0000 mg | INTRAVENOUS | Status: AC
  Administered 2016-01-02: 1500 mg via INTRAVENOUS
  Filled 2016-01-01 (×3): qty 1500

## 2016-01-01 NOTE — Progress Notes (Signed)
Anesthesia Chart Review:  Pt is a 65 year old male scheduled for revision of R radiocephalic AV fistula on [id number] with Dr. Kellie Simmering.   Pt is a same day work up.   Cardiologist is Dr. Rozann Lesches, last office visit 09/12/15, f/u in 1 year recommended.   PMH includes:  Non-ischemic cardiomyopathy, CHF, HTN, DM, ESRD on HD, anemia, stroke (01/2015), GERD. Never smoker. BMI 39  Medications include: ASA, carvedilol, lantus, prilosec, ramipril, renvela  Labs will be obtained DOS.   EKG 01/24/15: NSR. RBBB. LAFB. Bifascicular block. Septal infarct, age undetermined.   Echo 03/07/15:  - Procedure narrative: Transthoracic echocardiography. Image quality was adequate. The study was technically difficult, as a result of poor sound wave transmission. Intravenous contrast (Definity) was administered. - Left ventricle: Systolic function was normal. The estimated ejection fraction was in the range of 55% to 60%. - Mitral valve: Mildly calcified annulus. Mildly thickened leaflets  Nuclear stress test 11/29/12: Probably normal LV perfusion. Low risk study. Global LV systolic function normal, EF 53%  If labs acceptable DOS, I anticipate pt can proceed as scheduled.   Willeen Cass, FNP-BC [facility]/Anesthesiology Phone: [phone] 01/01/2016 9:04 AM

## 2016-01-01 NOTE — Anesthesia Preprocedure Evaluation (Addendum)
Anesthesia Evaluation  Patient identified by MRN, date of birth, ID band Patient awake    Reviewed: Allergy & Precautions, NPO status , Patient's Chart, lab work & pertinent test results  Airway Mallampati: II       Dental  (+) Upper Dentures, Dental Advisory Given   Pulmonary neg pulmonary ROS,    breath sounds clear to auscultation       Cardiovascular hypertension, Pt. on medications and Pt. on home beta blockers  Rhythm:Regular Rate:Normal     Neuro/Psych CVA negative psych ROS   GI/Hepatic GERD  Medicated,  Endo/Other  diabetes, Type 2, Insulin Dependent  Renal/GU ESRF and DialysisRenal disease  negative genitourinary   Musculoskeletal  (+) Arthritis ,   Abdominal   Peds negative pediatric ROS (+)  Hematology negative hematology ROS (+)   Anesthesia Other Findings   Reproductive/Obstetrics negative OB ROS                            Lab Results  Component Value Date   HGB 15.0 01/24/2015   HCT 44.0 01/24/2015   Lab Results  Component Value Date   CREATININE 11.70* 01/24/2015   BUN 40* 01/24/2015   NA 141 01/24/2015   K 3.9 01/24/2015   CL 102 01/24/2015   EKG: normal sinus rhythm, RBBB.  02/2015 Echo - Left ventricle: The cavity size was normal. There was moderate concentric hypertrophy. Systolic function was difficult to assess but appeared grossly normal. Ejection fraction could not be accurately assessed on the basis of this study. Images were inadequate for LV wall motion assessment. Doppler parameters are consistent with abnormal left ventricular relaxation (grade 1 diastolic dysfunction). - Aortic valve: Mildly calcified annulus. - Mitral valve: Mildly calcified annulus.     Anesthesia Physical Anesthesia Plan  ASA: III  Anesthesia Plan: MAC   Post-op Pain Management:    Induction: Intravenous  Airway Management Planned: Natural Airway and Simple Face  Mask  Additional Equipment:   Intra-op Plan:   Post-operative Plan:   Informed Consent: I have reviewed the patients History and Physical, chart, labs and discussed the procedure including the risks, benefits and alternatives for the proposed anesthesia with the patient or authorized representative who has indicated his/her understanding and acceptance.     Plan Discussed with: CRNA  Anesthesia Plan Comments:         Anesthesia Quick Evaluation

## 2016-01-02 ENCOUNTER — Ambulatory Visit (HOSPITAL_COMMUNITY)
Admission: RE | Admit: 2016-01-02 | Discharge: 2016-01-02 | Disposition: A | Discharge status: Home / Self Care | Payer: Medicare Other | Source: Ambulatory Visit | Attending: Vascular Surgery | Admitting: Vascular Surgery

## 2016-01-02 ENCOUNTER — Ambulatory Visit (HOSPITAL_COMMUNITY): Payer: Medicare Other | Admitting: Emergency Medicine

## 2016-01-02 ENCOUNTER — Encounter (HOSPITAL_COMMUNITY): Payer: Self-pay | Admitting: *Deleted

## 2016-01-02 ENCOUNTER — Encounter (HOSPITAL_COMMUNITY)
Admission: RE | Disposition: A | Discharge status: Home / Self Care | Payer: Self-pay | Source: Ambulatory Visit | Attending: Vascular Surgery

## 2016-01-02 DIAGNOSIS — I132 Hypertensive heart and chronic kidney disease with heart failure and with stage 5 chronic kidney disease, or end stage renal disease: Secondary | ICD-10-CM | POA: Insufficient documentation

## 2016-01-02 DIAGNOSIS — Z794 Long term (current) use of insulin: Secondary | ICD-10-CM | POA: Insufficient documentation

## 2016-01-02 DIAGNOSIS — M1990 Unspecified osteoarthritis, unspecified site: Secondary | ICD-10-CM | POA: Insufficient documentation

## 2016-01-02 DIAGNOSIS — K219 Gastro-esophageal reflux disease without esophagitis: Secondary | ICD-10-CM | POA: Diagnosis not present

## 2016-01-02 DIAGNOSIS — E1122 Type 2 diabetes mellitus with diabetic chronic kidney disease: Secondary | ICD-10-CM | POA: Diagnosis not present

## 2016-01-02 DIAGNOSIS — Z992 Dependence on renal dialysis: Secondary | ICD-10-CM | POA: Insufficient documentation

## 2016-01-02 DIAGNOSIS — I5022 Chronic systolic (congestive) heart failure: Secondary | ICD-10-CM | POA: Insufficient documentation

## 2016-01-02 DIAGNOSIS — N186 End stage renal disease: Secondary | ICD-10-CM | POA: Diagnosis not present

## 2016-01-02 DIAGNOSIS — I429 Cardiomyopathy, unspecified: Secondary | ICD-10-CM | POA: Insufficient documentation

## 2016-01-02 DIAGNOSIS — T82898A Other specified complication of vascular prosthetic devices, implants and grafts, initial encounter: Secondary | ICD-10-CM | POA: Diagnosis not present

## 2016-01-02 DIAGNOSIS — I10 Essential (primary) hypertension: Secondary | ICD-10-CM | POA: Diagnosis not present

## 2016-01-02 HISTORY — DX: Personal history of other medical treatment: Z92.89

## 2016-01-02 HISTORY — PX: REVISON OF ARTERIOVENOUS FISTULA: SHX6074

## 2016-01-02 LAB — GLUCOSE, CAPILLARY
GLUCOSE-CAPILLARY: 155 mg/dL — AB (ref 65–99)
GLUCOSE-CAPILLARY: 150 mg/dL — AB (ref 65–99)

## 2016-01-02 SURGERY — REVISON OF ARTERIOVENOUS FISTULA
Anesthesia: Monitor Anesthesia Care | Site: Arm Lower | Laterality: Right

## 2016-01-02 MED ORDER — PROPOFOL 500 MG/50ML IV EMUL
INTRAVENOUS | Status: AC
  Filled 2016-01-02: qty 50

## 2016-01-02 MED ORDER — SODIUM CHLORIDE 0.9 % IV SOLN
INTRAVENOUS | Status: DC
  Administered 2016-01-02 (×2): via INTRAVENOUS

## 2016-01-02 MED ORDER — PROPOFOL 10 MG/ML IV BOLUS
INTRAVENOUS | Status: AC
  Filled 2016-01-02: qty 20

## 2016-01-02 MED ORDER — PROPOFOL 500 MG/50ML IV EMUL
INTRAVENOUS | Status: DC | PRN
  Administered 2016-01-02: 25 ug/kg/min via INTRAVENOUS

## 2016-01-02 MED ORDER — PHENYLEPHRINE HCL 10 MG/ML IJ SOLN
INTRAMUSCULAR | Status: DC | PRN
  Administered 2016-01-02 (×2): 80 ug via INTRAVENOUS
  Administered 2016-01-02: 120 ug via INTRAVENOUS

## 2016-01-02 MED ORDER — MIDAZOLAM HCL 2 MG/2ML IJ SOLN
INTRAMUSCULAR | Status: AC
  Filled 2016-01-02: qty 2

## 2016-01-02 MED ORDER — PROTAMINE SULFATE 10 MG/ML IV SOLN
INTRAVENOUS | Status: DC | PRN
  Administered 2016-01-02: 30 mg via INTRAVENOUS

## 2016-01-02 MED ORDER — HEPARIN SODIUM (PORCINE) 1000 UNIT/ML IJ SOLN
INTRAMUSCULAR | Status: AC
  Filled 2016-01-02: qty 1

## 2016-01-02 MED ORDER — EPHEDRINE 5 MG/ML INJ
INTRAVENOUS | Status: AC
  Filled 2016-01-02: qty 10

## 2016-01-02 MED ORDER — PHENYLEPHRINE 40 MCG/ML (10ML) SYRINGE FOR IV PUSH (FOR BLOOD PRESSURE SUPPORT)
PREFILLED_SYRINGE | INTRAVENOUS | Status: AC
  Filled 2016-01-02: qty 20

## 2016-01-02 MED ORDER — PHENYLEPHRINE HCL 10 MG/ML IJ SOLN
10.0000 mg | INTRAVENOUS | Status: DC | PRN
  Administered 2016-01-02: 20 ug/min via INTRAVENOUS

## 2016-01-02 MED ORDER — CARVEDILOL 12.5 MG PO TABS: 12.5000 mg | ORAL_TABLET | Freq: Two times a day (BID) | ORAL | Status: DC

## 2016-01-02 MED ORDER — PROPOFOL 10 MG/ML IV BOLUS
INTRAVENOUS | Status: DC | PRN
  Administered 2016-01-02: 20 mg via INTRAVENOUS

## 2016-01-02 MED ORDER — ROCURONIUM BROMIDE 50 MG/5ML IV SOLN
INTRAVENOUS | Status: AC
  Filled 2016-01-02: qty 1

## 2016-01-02 MED ORDER — CARVEDILOL 12.5 MG PO TABS
12.5000 mg | ORAL_TABLET | Freq: Once | ORAL | Status: AC
  Administered 2016-01-02: 12.5 mg via ORAL

## 2016-01-02 MED ORDER — LIDOCAINE HCL (PF) 1 % IJ SOLN
INTRAMUSCULAR | Status: AC
  Filled 2016-01-02: qty 30

## 2016-01-02 MED ORDER — LIDOCAINE 2% (20 MG/ML) 5 ML SYRINGE
INTRAMUSCULAR | Status: AC
  Filled 2016-01-02: qty 5

## 2016-01-02 MED ORDER — PROPOFOL 1000 MG/100ML IV EMUL
INTRAVENOUS | Status: AC
  Filled 2016-01-02: qty 100

## 2016-01-02 MED ORDER — EPHEDRINE SULFATE 50 MG/ML IJ SOLN
INTRAMUSCULAR | Status: DC | PRN
  Administered 2016-01-02: 10 mg via INTRAVENOUS

## 2016-01-02 MED ORDER — CHLORHEXIDINE GLUCONATE CLOTH 2 % EX PADS: 6.0000 | MEDICATED_PAD | Freq: Once | CUTANEOUS | Status: DC

## 2016-01-02 MED ORDER — LIDOCAINE HCL (PF) 1 % IJ SOLN
INTRAMUSCULAR | Status: DC | PRN
  Administered 2016-01-02: 11 mL

## 2016-01-02 MED ORDER — SODIUM CHLORIDE 0.9 % IV SOLN
INTRAVENOUS | Status: DC | PRN
  Administered 2016-01-02: 500 mL

## 2016-01-02 MED ORDER — FENTANYL CITRATE (PF) 250 MCG/5ML IJ SOLN
INTRAMUSCULAR | Status: AC
  Filled 2016-01-02: qty 5

## 2016-01-02 MED ORDER — LACTATED RINGERS IV SOLN: INTRAVENOUS | Status: DC

## 2016-01-02 MED ORDER — LIDOCAINE-EPINEPHRINE (PF) 1 %-1:200000 IJ SOLN: INTRAMUSCULAR | Status: DC | PRN

## 2016-01-02 MED ORDER — HEPARIN SODIUM (PORCINE) 1000 UNIT/ML IJ SOLN
INTRAMUSCULAR | Status: DC | PRN
  Administered 2016-01-02: 3000 [IU] via INTRAVENOUS
  Administered 2016-01-02: 2000 [IU] via INTRAVENOUS

## 2016-01-02 MED ORDER — MEPERIDINE HCL 25 MG/ML IJ SOLN: 6.2500 mg | INTRAMUSCULAR | Status: DC | PRN

## 2016-01-02 MED ORDER — CARVEDILOL 12.5 MG PO TABS
ORAL_TABLET | ORAL | Status: AC
  Filled 2016-01-02: qty 1

## 2016-01-02 MED ORDER — ONDANSETRON HCL 4 MG/2ML IJ SOLN
INTRAMUSCULAR | Status: DC | PRN
  Administered 2016-01-02: 4 mg via INTRAVENOUS

## 2016-01-02 MED ORDER — HYDROCODONE-ACETAMINOPHEN 5-325 MG PO TABS: 1.0000 | ORAL_TABLET | Freq: Four times a day (QID) | ORAL | Status: DC | PRN

## 2016-01-02 MED ORDER — MIDAZOLAM HCL 5 MG/5ML IJ SOLN
INTRAMUSCULAR | Status: DC | PRN
  Administered 2016-01-02: 2 mg via INTRAVENOUS

## 2016-01-02 MED ORDER — FENTANYL CITRATE (PF) 100 MCG/2ML IJ SOLN: 25.0000 ug | INTRAMUSCULAR | Status: DC | PRN

## 2016-01-02 SURGICAL SUPPLY — 41 items
BANDAGE ELASTIC 4 VELCRO ST LF (GAUZE/BANDAGES/DRESSINGS) ×2 IMPLANT
BLADE SURG 15 STRL LF DISP TIS (BLADE) IMPLANT
BLADE SURG 15 STRL SS (BLADE) ×6
BNDG GAUZE ELAST 4 BULKY (GAUZE/BANDAGES/DRESSINGS) ×2 IMPLANT
CANISTER SUCTION 2500CC (MISCELLANEOUS) ×3 IMPLANT
CATH EMB 3FR 40CM (CATHETERS) ×2 IMPLANT
CLIP TI MEDIUM 6 (CLIP) ×3 IMPLANT
CLIP TI WIDE RED SMALL 6 (CLIP) ×3 IMPLANT
COVER PROBE W GEL 5X96 (DRAPES) ×2 IMPLANT
ELECT REM PT RETURN 9FT ADLT (ELECTROSURGICAL) ×3
ELECTRODE REM PT RTRN 9FT ADLT (ELECTROSURGICAL) ×1 IMPLANT
GAUZE SPONGE 4X4 12PLY STRL (GAUZE/BANDAGES/DRESSINGS) ×3 IMPLANT
GEL ULTRASOUND 20GR AQUASONIC (MISCELLANEOUS) IMPLANT
GLOVE BIO SURGEON STRL SZ 6.5 (GLOVE) ×2 IMPLANT
GLOVE BIO SURGEONS STRL SZ 6.5 (GLOVE) ×2
GLOVE BIOGEL PI IND STRL 6.5 (GLOVE) IMPLANT
GLOVE BIOGEL PI INDICATOR 6.5 (GLOVE) ×4
GLOVE ECLIPSE 6.5 STRL STRAW (GLOVE) ×2 IMPLANT
GLOVE SS BIOGEL STRL SZ 7 (GLOVE) ×1 IMPLANT
GLOVE SUPERSENSE BIOGEL SZ 7 (GLOVE) ×4
GLOVE SURG SS PI 7.0 STRL IVOR (GLOVE) ×2 IMPLANT
GOWN STRL REUS W/ TWL LRG LVL3 (GOWN DISPOSABLE) ×3 IMPLANT
GOWN STRL REUS W/TWL LRG LVL3 (GOWN DISPOSABLE) ×9
GOWN STRL REUS W/TWL XL LVL3 (GOWN DISPOSABLE) ×2 IMPLANT
KIT BASIN OR (CUSTOM PROCEDURE TRAY) ×3 IMPLANT
KIT ROOM TURNOVER OR (KITS) ×3 IMPLANT
LIQUID BAND (GAUZE/BANDAGES/DRESSINGS) ×3 IMPLANT
NS IRRIG 1000ML POUR BTL (IV SOLUTION) ×3 IMPLANT
PACK CV ACCESS (CUSTOM PROCEDURE TRAY) ×3 IMPLANT
PAD ARMBOARD 7.5X6 YLW CONV (MISCELLANEOUS) ×6 IMPLANT
PATCH VASC XENOSURE 1CMX6CM (Vascular Products) ×3 IMPLANT
PATCH VASC XENOSURE 1X6 (Vascular Products) IMPLANT
SPONGE LAP 18X18 X RAY DECT (DISPOSABLE) ×2 IMPLANT
SUT PROLENE 6 0 BV (SUTURE) ×3 IMPLANT
SUT PROLENE 6 0 C 1 24 (SUTURE) ×8 IMPLANT
SUT PROLENE 6 0 CC (SUTURE) ×12 IMPLANT
SUT VIC AB 3-0 SH 27 (SUTURE) ×3
SUT VIC AB 3-0 SH 27X BRD (SUTURE) ×1 IMPLANT
SYR TB 1ML LUER SLIP (SYRINGE) ×2 IMPLANT
UNDERPAD 30X30 INCONTINENT (UNDERPADS AND DIAPERS) ×3 IMPLANT
WATER STERILE IRR 1000ML POUR (IV SOLUTION) ×3 IMPLANT

## 2016-01-02 NOTE — Op Note (Signed)
OPERATIVE REPORT  Date of Surgery: 01/02/2016  Surgeon: Tinnie Gens, MD  Assistant: Leontine Locket PA  Pre-op Diagnosis: decrease flow in hemodialysis fistula  Post-op Diagnosis: poorly functioning right AV  fistula due to radial artery disease  Procedure: Procedure(s): REVISION OF RADIOCEPHALIC ARTERIOVENOUS FISTULA  with BOVINE PATCH ANGIOPLASTY RIGHT RADIAL ARTERY  Anesthesia: Mac  EBL: Minimal  Complications: None  Procedure Details: Patient was taken the operative placed in supine position at which time right upper extremity was prepped Betadine scrub and solution draped in routine sterile manner. After infiltration with 1% Xylocaine longitudinal incision was made near the wrist where the cephalic vein had been anastomosed to the radial artery. There is a severe stenosis in a disease radial artery documented by fistulogram. Dissection was carried down through subcutaneous tissue and proximal distal control radial artery was obtained. The cephalic vein was dissected free out for about 3-4 cm as well. Patient was given 3000 units of heparin intravenously. The artery was diffusely calcified proximally and distally. It was occluded proximally and distally and the vein was excised from the arterial anastomosis. Potts scissors were then used to extend the artery ottoman in the radial artery proximally and distally and as noted it was diffusely diseased but there was fairly good inflow and backbleeding. 2 and half dilator would traverse both areas irregularity. Bovine patch was then prepared and sewn into place on the radial artery with 6-0 Prolene. Following this was an opening made in the patch and the vein was slightly spatulated and reanastomosed to the patch with 6-0 Prolene. Clamps were then released and there was a pulse and palpable thrill in the fistula with adequate Doppler flow and good radial and ulnar Doppler flow as well. An additional 2000 units of heparin had been given during the  procedure and at the conclusion 30 mg protamine was administered. Adequate hemostasis was achieved wound closed in layers with Vicryl in subcuticular fashion with Dermabond patient taken to recovery in satisfactory condition Note patient this is not a fistula which has potential for further surgical revision because of diffuse severe calcific radial arterial disease. Efficient reocclude patient will need a tunneled catheter and evaluation for new access   Tinnie Gens, MD 01/02/2016 9:40 AM

## 2016-01-02 NOTE — Anesthesia Postprocedure Evaluation (Signed)
Anesthesia Post Note  Patient: Jacob [name]  Procedure(s) Performed: Procedure(s) (LRB): REVISION OF RADIOCEPHALIC ARTERIOVENOUS FISTULA  with BOVINE PATCH ANGIOPLASTY RIGHT RADIAL ARTERY (Right)  Patient location during evaluation: PACU Anesthesia Type: MAC Level of consciousness: awake and alert Pain management: pain level controlled Vital Signs Assessment: post-procedure vital signs reviewed and stable Respiratory status: spontaneous breathing, nonlabored ventilation, respiratory function stable and patient connected to nasal cannula oxygen Cardiovascular status: stable and blood pressure returned to baseline Anesthetic complications: no    Last Vitals:  Filed Vitals:   01/02/16 1015 01/02/16 1033  BP: 103/72 129/68  Pulse: 61 61  Temp:    Resp: 13     Last Pain: There were no vitals filed for this visit.               Effie Berkshire

## 2016-01-02 NOTE — Interval H&P Note (Signed)
History and Physical Interval Note:  01/02/2016 7:33 AM  Jacob [name]  has presented today for surgery, with the diagnosis of decrease flow in hemodialysis fistula  The various methods of treatment have been discussed with the patient and family. After consideration of risks, benefits and other options for treatment, the patient has consented to  Procedure(s): REVISION OF RADIOCEPHALIC ARTERIOVENOUS FISTULA  (Right) as a surgical intervention .  The patient's history has been reviewed, patient examined, no change in status, stable for surgery.  I have reviewed the patient's chart and labs.  Questions were answered to the patient's satisfaction.     Tinnie Gens

## 2016-01-02 NOTE — H&P (View-Only) (Signed)
Referring Physician: Sharilyn Sites, MD  Patient name: Jacob [name] MRN: [id number] DOB: March 03, 1951 Sex: male  REASON FOR CONSULT: right radio-cephalic poorly functioning fistula  HPI: Jacob [name] is a 65 y.o. male,  He was sent here today for poorly functioning right forearm av fistula.  This av fistula was last revised on 01/03/2016 by Dr. Bridgett Larsson for sclerotic distal cephalic vein causing narrowing in the fistula.    He is here for evaluation and possible intervention.  Other medical problems include DM managed with insulin  Past Medical History  Diagnosis Date  . Nonischemic cardiomyopathy ([place])     Hypertension and regular alcohol use, LVEF 40-45%  . End stage renal disease ([place])     Hemodialysis on Monday, Wednesday, Friday  . Chronic systolic heart failure ([place])   . Anemia in chronic kidney disease(285.21)   . Type 2 diabetes mellitus ([place])   . Essential hypertension, benign   . Glaucoma   . GERD (gastroesophageal reflux disease)   . Arthritis    Past Surgical History  Procedure Laterality Date  . Lumbar spine surgery    . Colonoscopy      Hx: of  . Av fistula placement      Hx: of  . Ligation of arteriovenous  fistula Left 05/04/2013    Procedure: LIGATION OF LEFT RADIAL CEPHALIC ARTERIOVENOUS  FISTULA;  Surgeon: Conrad Old Ripley, MD;  Location: [place];  Service: Vascular;  Laterality: Left;  Ultrasound guided  . Insertion of dialysis catheter Right 05/04/2013    Procedure: INSERTION OF DIALYSIS CATHETER;  Surgeon: Conrad Bland, MD;  Location: [place];  Service: Vascular;  Laterality: Right;  Ultrasound guided  . Fistulogram N/A 05/04/2013    Procedure: CENTRAL VENOGRAM;  Surgeon: Conrad St. Don[place], MD;  Location: [place];  Service: Vascular;  Laterality: N/A;  . Av fistula placement Right 05/04/2013    Procedure: ARTERIOVENOUS (AV) FISTULA CREATION- RIGHT ARM;  Surgeon: Conrad [place], MD;  Location: [place];  Service: Vascular;  Laterality: Right;  Ultrasound guided  . Revison of  arteriovenous fistula Right 01/02/2014    Procedure: REVISON OF ARTERIOVENOUS FISTULA ANASTOMOSIS;  Surgeon: Conrad Caruthers, MD;  Location: [place];  Service: Vascular;  Laterality: Right;  . Shuntogram N/A 11/07/2013    Procedure: Fistulogram;  Surgeon: [name], MD;  Location: [facility] CATH LAB;  Service: Cardiovascular;  Laterality: N/A;  . Cataract extraction w/phaco Left 07/16/2014    Procedure: CATARACT EXTRACTION PHACO AND INTRAOCULAR LENS PLACEMENT (IOC);  Surgeon: [name], MD;  [facility];  Service: Ophthalmology;  Laterality: Left;  CDE:8.86  . Cataract extraction w/phaco Right 07/30/2014    Procedure: CATARACT EXTRACTION PHACO AND INTRAOCULAR LENS PLACEMENT (IOC);  Surgeon: [name], MD;  [facility]: [facility];  Service: Ophthalmology;  Laterality: Right;  CDE 8.99  . Peripheral vascular catheterization N/A 01/24/2015    Procedure: Fistulagram;  Surgeon: Conrad Woodcrest, MD;  Location: [place] CV LAB;  Service: Cardiovascular;  Laterality: N/A;    Family History  Problem Relation Age of Onset  . Heart attack Brother     33  . Heart disease Brother     before age 47  . Hypertension Brother   . Heart attack Brother     15  . Heart attack Brother     11  . Diabetes Mother   . Hypertension Mother   . Heart disease Mother   . Heart disease Father   . Hypertension  Father   . Other Father     amputation  . Hypertension Sister   . Heart disease Sister   . Vision loss Maternal Uncle     SOCIAL HISTORY: Social History   Social History  . Marital Status: Single    Spouse Name: N/A  . Number of Children: N/A  . Years of Education: N/A   Occupational History  . Not on file.   Social History Main Topics  . Smoking status: Never Smoker   . Smokeless tobacco: Never Used  . Alcohol Use: 1.8 oz/week    3 Shots of liquor per week     Comment: Regular use for years, reports at least a pint per week  . Drug Use: No  . Sexual Activity: Yes    Birth Control/ Protection: None    Other Topics Concern  . Not on file   Social History Narrative    Allergies  Allergen Reactions  . Bee Venom Anaphylaxis  . Penicillins Swelling    Swelling of throat and tongue    Current Outpatient Prescriptions  Medication Sig Dispense Refill  . acetaminophen (TYLENOL) 500 MG tablet Take 1,000 mg by mouth every 6 (six) hours as needed for mild pain.    . B Complex-C-Folic Acid (DIALYVITE TABLET) TABS Take 1 tablet by mouth every other day.     . B-D ULTRAFINE III SHORT PEN 31G X 8 MM MISC     . carvedilol (COREG) 12.5 MG tablet Take 12.5 mg by mouth every morning.     [name] EPIPEN 2-PAK 0.3 MG/0.3ML SOAJ injection Inject 0.3 mg into the muscle once as needed (for bee).     [name] HYDROcodone-acetaminophen (NORCO/VICODIN) 5-325 MG per tablet Take 1 tablet by mouth every 6 (six) hours as needed for moderate pain. 15 tablet 0  . insulin glargine (LANTUS) 100 UNIT/ML injection Inject 30 Units into the skin at bedtime.     [name] LANTUS SOLOSTAR 100 UNIT/ML Solostar Pen     . meclizine (ANTIVERT) 25 MG tablet Take 25 mg by mouth 3 (three) times daily as needed for dizziness.    [name] omeprazole (PRILOSEC) 40 MG capsule Take 40 mg by mouth daily.      . ramipril (ALTACE) 2.5 MG capsule Take 2.5 mg by mouth daily.    [name] RENVELA 800 MG tablet Take 800 mg by mouth 3 (three) times daily with meals.      No current facility-administered medications for this visit.    ROS:   General:  No weight loss, Fever, chills  HEENT: No recent headaches, no nasal bleeding, no visual changes, no sore throat  Neurologic: No dizziness, blackouts, seizures. No recent symptoms of stroke or mini- stroke. No recent episodes of slurred speech, or temporary blindness.  Cardiac: No recent episodes of chest pain/pressure, no shortness of breath at rest.  No shortness of breath with exertion.  Denies history of atrial fibrillation or irregular heartbeat  Vascular: No history of rest pain in feet.  No history of  claudication.  No history of non-healing ulcer, No history of DVT   Pulmonary: No home oxygen, no productive cough, no hemoptysis,  No asthma or wheezing  Musculoskeletal:  [ ]  Arthritis, [ ]  Low back pain,  [ ]  Joint pain  Hematologic:No history of hypercoagulable state.  No history of easy bleeding.  No history of anemia  Gastrointestinal: No hematochezia or melena,  No gastroesophageal reflux, no trouble swallowing  Urinary: [ ]  chronic Kidney disease, [ ]   on HD - [ ]  MWF or [ ]  TTHS, [ ]  Burning with urination, [ ]  Frequent urination, [ ]  Difficulty urinating;   Skin: No rashes  Psychological: No history of anxiety,  No history of depression   Physical Examination  Filed Vitals:   12/13/15 1512  BP: 138/76  Pulse: 80  Height: 6\' 2"  (1.88 m)  Weight: 304 lb 4.8 oz (138.03 kg)  SpO2: 94%    Body mass index is 39.05 kg/(m^2).  General:  Alert and oriented, no acute distress HEENT: Normal Neck: No bruit or JVD Pulmonary: Clear to auscultation bilaterally Cardiac: Regular Rate and Rhythm without murmur Abdomen: Soft, non-tender, non-distended, no mass, no scars Skin: No rash Extremity Pulses:  2+ radial, brachial, femoral, dorsalis pedis, posterior tibial pulses bilaterally Musculoskeletal: No deformity or edema  Neurologic: Upper and lower extremity motor 5/5 and symmetric  DATA:  Fistula duplex Distal forearm stenosis 556 cm/s   ASSESSMENT:   Imminent occlusion of R RC AVF  PLAN:   We will ask IR to perform a fistulogram with possible intervention to try and salvage the fistula.   We will arrange the procedure.   Theda Sers, EMMA [facility] PA-C [facility] Office: [phone]  The patient was seen in conjunction with Dr. Bridgett Larsson  Addendum  I have independently interviewed and examined the patient, and I agree with the physician assistant's findings.  Pt's R RC AVF has no thrill and bruit has a low volume high pitch blowing  sound, c/w imminent occlusion.  Unfortunately, no PV or IR space this coming week so while have to refer the patient to IR for R arm fistulogram, possible intervention.  [name], MD [facility] [place] Office: [phone] Pager: [phone]  12/13/2015, 4:27 PM

## 2016-01-02 NOTE — Anesthesia Procedure Notes (Signed)
Procedure Name: MAC Date/Time: 01/02/2016 7:43 AM Performed by: Salli Quarry Shey Yott Pre-anesthesia Checklist: Patient identified, Emergency Drugs available, Suction available and Patient being monitored Patient Re-evaluated:Patient Re-evaluated prior to inductionOxygen Delivery Method: Simple face mask

## 2016-01-02 NOTE — Discharge Instructions (Signed)
° ° °  01/02/2016 LAMERE AHLERT [id number] Apr 16, 1951  Surgeon(s): Mal [name], MD  Procedure(s): REVISION OF RADIOCEPHALIC ARTERIOVENOUS FISTULA  with BOVINE PATCH ANGIOPLASTY RIGHT RADIAL ARTERY  x May stick graft immediately **Do not stick over incision**   **Not a candidate for further revision of right radial cephalic AVF**

## 2016-01-02 NOTE — Transfer of Care (Signed)
Immediate Anesthesia Transfer of Care Note  Patient: Jacob [name]  Procedure(s) Performed: Procedure(s): REVISION OF RADIOCEPHALIC ARTERIOVENOUS FISTULA  with BOVINE PATCH ANGIOPLASTY RIGHT RADIAL ARTERY (Right)  Patient Location: PACU  Anesthesia Type:MAC  Level of Consciousness: awake, alert  and oriented  Airway & Oxygen Therapy: Patient Spontanous Breathing  Post-op Assessment: Report given to RN  Post vital signs: Reviewed and stable  Last Vitals:  Filed Vitals:   01/02/16 0608  BP: 132/66  Pulse: 72  Temp: 36.9 C  Resp: 20    Last Pain: There were no vitals filed for this visit.    Patients Stated Pain Goal: 2 ([id number])  Complications: No apparent anesthesia complications

## 2016-01-03 ENCOUNTER — Encounter (HOSPITAL_COMMUNITY): Payer: Self-pay | Admitting: Vascular Surgery

## 2016-01-03 DIAGNOSIS — N2581 Secondary hyperparathyroidism of renal origin: Secondary | ICD-10-CM | POA: Diagnosis not present

## 2016-01-03 DIAGNOSIS — N186 End stage renal disease: Secondary | ICD-10-CM | POA: Diagnosis not present

## 2016-01-03 DIAGNOSIS — D509 Iron deficiency anemia, unspecified: Secondary | ICD-10-CM | POA: Diagnosis not present

## 2016-01-03 DIAGNOSIS — D631 Anemia in chronic kidney disease: Secondary | ICD-10-CM | POA: Diagnosis not present

## 2016-01-03 DIAGNOSIS — Z992 Dependence on renal dialysis: Secondary | ICD-10-CM | POA: Diagnosis not present

## 2016-01-03 LAB — POCT I-STAT 4, (NA,K, GLUC, HGB,HCT)
GLUCOSE: 162 mg/dL — AB (ref 65–99)
HCT: 40 % (ref 39.0–52.0)
HEMOGLOBIN: 13.6 g/dL (ref 13.0–17.0)
Potassium: 4.2 mmol/L (ref 3.5–5.1)
Sodium: 141 mmol/L (ref 135–145)

## 2016-01-06 DIAGNOSIS — Z992 Dependence on renal dialysis: Secondary | ICD-10-CM | POA: Diagnosis not present

## 2016-01-06 DIAGNOSIS — D631 Anemia in chronic kidney disease: Secondary | ICD-10-CM | POA: Diagnosis not present

## 2016-01-06 DIAGNOSIS — D509 Iron deficiency anemia, unspecified: Secondary | ICD-10-CM | POA: Diagnosis not present

## 2016-01-06 DIAGNOSIS — N186 End stage renal disease: Secondary | ICD-10-CM | POA: Diagnosis not present

## 2016-01-06 DIAGNOSIS — N2581 Secondary hyperparathyroidism of renal origin: Secondary | ICD-10-CM | POA: Diagnosis not present

## 2016-01-07 DIAGNOSIS — E10618 Type 1 diabetes mellitus with other diabetic arthropathy: Secondary | ICD-10-CM | POA: Diagnosis not present

## 2016-01-07 DIAGNOSIS — Z6839 Body mass index (BMI) 39.0-39.9, adult: Secondary | ICD-10-CM | POA: Diagnosis not present

## 2016-01-07 DIAGNOSIS — E109 Type 1 diabetes mellitus without complications: Secondary | ICD-10-CM | POA: Diagnosis not present

## 2016-01-07 DIAGNOSIS — M1611 Unilateral primary osteoarthritis, right hip: Secondary | ICD-10-CM | POA: Diagnosis not present

## 2016-01-08 DIAGNOSIS — N186 End stage renal disease: Secondary | ICD-10-CM | POA: Diagnosis not present

## 2016-01-08 DIAGNOSIS — D509 Iron deficiency anemia, unspecified: Secondary | ICD-10-CM | POA: Diagnosis not present

## 2016-01-08 DIAGNOSIS — D631 Anemia in chronic kidney disease: Secondary | ICD-10-CM | POA: Diagnosis not present

## 2016-01-08 DIAGNOSIS — Z992 Dependence on renal dialysis: Secondary | ICD-10-CM | POA: Diagnosis not present

## 2016-01-08 DIAGNOSIS — N2581 Secondary hyperparathyroidism of renal origin: Secondary | ICD-10-CM | POA: Diagnosis not present

## 2016-01-10 DIAGNOSIS — Z992 Dependence on renal dialysis: Secondary | ICD-10-CM | POA: Diagnosis not present

## 2016-01-10 DIAGNOSIS — N186 End stage renal disease: Secondary | ICD-10-CM | POA: Diagnosis not present

## 2016-01-10 DIAGNOSIS — D631 Anemia in chronic kidney disease: Secondary | ICD-10-CM | POA: Diagnosis not present

## 2016-01-10 DIAGNOSIS — D509 Iron deficiency anemia, unspecified: Secondary | ICD-10-CM | POA: Diagnosis not present

## 2016-01-10 DIAGNOSIS — N2581 Secondary hyperparathyroidism of renal origin: Secondary | ICD-10-CM | POA: Diagnosis not present

## 2016-01-13 DIAGNOSIS — D631 Anemia in chronic kidney disease: Secondary | ICD-10-CM | POA: Diagnosis not present

## 2016-01-13 DIAGNOSIS — D509 Iron deficiency anemia, unspecified: Secondary | ICD-10-CM | POA: Diagnosis not present

## 2016-01-13 DIAGNOSIS — Z992 Dependence on renal dialysis: Secondary | ICD-10-CM | POA: Diagnosis not present

## 2016-01-13 DIAGNOSIS — N2581 Secondary hyperparathyroidism of renal origin: Secondary | ICD-10-CM | POA: Diagnosis not present

## 2016-01-13 DIAGNOSIS — N186 End stage renal disease: Secondary | ICD-10-CM | POA: Diagnosis not present

## 2016-01-15 DIAGNOSIS — Z992 Dependence on renal dialysis: Secondary | ICD-10-CM | POA: Diagnosis not present

## 2016-01-15 DIAGNOSIS — D631 Anemia in chronic kidney disease: Secondary | ICD-10-CM | POA: Diagnosis not present

## 2016-01-15 DIAGNOSIS — N2581 Secondary hyperparathyroidism of renal origin: Secondary | ICD-10-CM | POA: Diagnosis not present

## 2016-01-15 DIAGNOSIS — D509 Iron deficiency anemia, unspecified: Secondary | ICD-10-CM | POA: Diagnosis not present

## 2016-01-15 DIAGNOSIS — N186 End stage renal disease: Secondary | ICD-10-CM | POA: Diagnosis not present

## 2016-01-17 DIAGNOSIS — Z992 Dependence on renal dialysis: Secondary | ICD-10-CM | POA: Diagnosis not present

## 2016-01-17 DIAGNOSIS — D509 Iron deficiency anemia, unspecified: Secondary | ICD-10-CM | POA: Diagnosis not present

## 2016-01-17 DIAGNOSIS — E119 Type 2 diabetes mellitus without complications: Secondary | ICD-10-CM | POA: Diagnosis not present

## 2016-01-17 DIAGNOSIS — N2581 Secondary hyperparathyroidism of renal origin: Secondary | ICD-10-CM | POA: Diagnosis not present

## 2016-01-17 DIAGNOSIS — Z1159 Encounter for screening for other viral diseases: Secondary | ICD-10-CM | POA: Diagnosis not present

## 2016-01-17 DIAGNOSIS — D631 Anemia in chronic kidney disease: Secondary | ICD-10-CM | POA: Diagnosis not present

## 2016-01-17 DIAGNOSIS — N186 End stage renal disease: Secondary | ICD-10-CM | POA: Diagnosis not present

## 2016-01-20 DIAGNOSIS — D509 Iron deficiency anemia, unspecified: Secondary | ICD-10-CM | POA: Diagnosis not present

## 2016-01-20 DIAGNOSIS — D631 Anemia in chronic kidney disease: Secondary | ICD-10-CM | POA: Diagnosis not present

## 2016-01-20 DIAGNOSIS — N2581 Secondary hyperparathyroidism of renal origin: Secondary | ICD-10-CM | POA: Diagnosis not present

## 2016-01-20 DIAGNOSIS — Z992 Dependence on renal dialysis: Secondary | ICD-10-CM | POA: Diagnosis not present

## 2016-01-20 DIAGNOSIS — N186 End stage renal disease: Secondary | ICD-10-CM | POA: Diagnosis not present

## 2016-01-22 DIAGNOSIS — N2581 Secondary hyperparathyroidism of renal origin: Secondary | ICD-10-CM | POA: Diagnosis not present

## 2016-01-22 DIAGNOSIS — D631 Anemia in chronic kidney disease: Secondary | ICD-10-CM | POA: Diagnosis not present

## 2016-01-22 DIAGNOSIS — Z992 Dependence on renal dialysis: Secondary | ICD-10-CM | POA: Diagnosis not present

## 2016-01-22 DIAGNOSIS — N186 End stage renal disease: Secondary | ICD-10-CM | POA: Diagnosis not present

## 2016-01-22 DIAGNOSIS — D509 Iron deficiency anemia, unspecified: Secondary | ICD-10-CM | POA: Diagnosis not present

## 2016-01-24 DIAGNOSIS — D631 Anemia in chronic kidney disease: Secondary | ICD-10-CM | POA: Diagnosis not present

## 2016-01-24 DIAGNOSIS — D509 Iron deficiency anemia, unspecified: Secondary | ICD-10-CM | POA: Diagnosis not present

## 2016-01-24 DIAGNOSIS — N186 End stage renal disease: Secondary | ICD-10-CM | POA: Diagnosis not present

## 2016-01-24 DIAGNOSIS — N2581 Secondary hyperparathyroidism of renal origin: Secondary | ICD-10-CM | POA: Diagnosis not present

## 2016-01-24 DIAGNOSIS — Z992 Dependence on renal dialysis: Secondary | ICD-10-CM | POA: Diagnosis not present

## 2016-01-27 DIAGNOSIS — Z992 Dependence on renal dialysis: Secondary | ICD-10-CM | POA: Diagnosis not present

## 2016-01-27 DIAGNOSIS — D631 Anemia in chronic kidney disease: Secondary | ICD-10-CM | POA: Diagnosis not present

## 2016-01-27 DIAGNOSIS — N2581 Secondary hyperparathyroidism of renal origin: Secondary | ICD-10-CM | POA: Diagnosis not present

## 2016-01-27 DIAGNOSIS — D509 Iron deficiency anemia, unspecified: Secondary | ICD-10-CM | POA: Diagnosis not present

## 2016-01-27 DIAGNOSIS — N186 End stage renal disease: Secondary | ICD-10-CM | POA: Diagnosis not present

## 2016-01-29 DIAGNOSIS — N2581 Secondary hyperparathyroidism of renal origin: Secondary | ICD-10-CM | POA: Diagnosis not present

## 2016-01-29 DIAGNOSIS — Z992 Dependence on renal dialysis: Secondary | ICD-10-CM | POA: Diagnosis not present

## 2016-01-29 DIAGNOSIS — D631 Anemia in chronic kidney disease: Secondary | ICD-10-CM | POA: Diagnosis not present

## 2016-01-29 DIAGNOSIS — D509 Iron deficiency anemia, unspecified: Secondary | ICD-10-CM | POA: Diagnosis not present

## 2016-01-29 DIAGNOSIS — N186 End stage renal disease: Secondary | ICD-10-CM | POA: Diagnosis not present

## 2016-01-31 DIAGNOSIS — D631 Anemia in chronic kidney disease: Secondary | ICD-10-CM | POA: Diagnosis not present

## 2016-01-31 DIAGNOSIS — N186 End stage renal disease: Secondary | ICD-10-CM | POA: Diagnosis not present

## 2016-01-31 DIAGNOSIS — D509 Iron deficiency anemia, unspecified: Secondary | ICD-10-CM | POA: Diagnosis not present

## 2016-01-31 DIAGNOSIS — N2581 Secondary hyperparathyroidism of renal origin: Secondary | ICD-10-CM | POA: Diagnosis not present

## 2016-01-31 DIAGNOSIS — Z992 Dependence on renal dialysis: Secondary | ICD-10-CM | POA: Diagnosis not present

## 2016-02-03 DIAGNOSIS — N186 End stage renal disease: Secondary | ICD-10-CM | POA: Diagnosis not present

## 2016-02-03 DIAGNOSIS — D509 Iron deficiency anemia, unspecified: Secondary | ICD-10-CM | POA: Diagnosis not present

## 2016-02-03 DIAGNOSIS — N2581 Secondary hyperparathyroidism of renal origin: Secondary | ICD-10-CM | POA: Diagnosis not present

## 2016-02-03 DIAGNOSIS — Z992 Dependence on renal dialysis: Secondary | ICD-10-CM | POA: Diagnosis not present

## 2016-02-05 DIAGNOSIS — D509 Iron deficiency anemia, unspecified: Secondary | ICD-10-CM | POA: Diagnosis not present

## 2016-02-05 DIAGNOSIS — N186 End stage renal disease: Secondary | ICD-10-CM | POA: Diagnosis not present

## 2016-02-05 DIAGNOSIS — Z992 Dependence on renal dialysis: Secondary | ICD-10-CM | POA: Diagnosis not present

## 2016-02-05 DIAGNOSIS — N2581 Secondary hyperparathyroidism of renal origin: Secondary | ICD-10-CM | POA: Diagnosis not present

## 2016-02-07 DIAGNOSIS — N186 End stage renal disease: Secondary | ICD-10-CM | POA: Diagnosis not present

## 2016-02-07 DIAGNOSIS — D509 Iron deficiency anemia, unspecified: Secondary | ICD-10-CM | POA: Diagnosis not present

## 2016-02-07 DIAGNOSIS — N2581 Secondary hyperparathyroidism of renal origin: Secondary | ICD-10-CM | POA: Diagnosis not present

## 2016-02-07 DIAGNOSIS — Z992 Dependence on renal dialysis: Secondary | ICD-10-CM | POA: Diagnosis not present

## 2016-02-10 DIAGNOSIS — N2581 Secondary hyperparathyroidism of renal origin: Secondary | ICD-10-CM | POA: Diagnosis not present

## 2016-02-10 DIAGNOSIS — D509 Iron deficiency anemia, unspecified: Secondary | ICD-10-CM | POA: Diagnosis not present

## 2016-02-10 DIAGNOSIS — N186 End stage renal disease: Secondary | ICD-10-CM | POA: Diagnosis not present

## 2016-02-10 DIAGNOSIS — Z992 Dependence on renal dialysis: Secondary | ICD-10-CM | POA: Diagnosis not present

## 2016-02-11 DIAGNOSIS — E1129 Type 2 diabetes mellitus with other diabetic kidney complication: Secondary | ICD-10-CM | POA: Diagnosis not present

## 2016-02-11 DIAGNOSIS — E109 Type 1 diabetes mellitus without complications: Secondary | ICD-10-CM | POA: Diagnosis not present

## 2016-02-11 DIAGNOSIS — G4700 Insomnia, unspecified: Secondary | ICD-10-CM | POA: Diagnosis not present

## 2016-02-11 DIAGNOSIS — E1165 Type 2 diabetes mellitus with hyperglycemia: Secondary | ICD-10-CM | POA: Diagnosis not present

## 2016-02-11 DIAGNOSIS — N185 Chronic kidney disease, stage 5: Secondary | ICD-10-CM | POA: Diagnosis not present

## 2016-02-11 DIAGNOSIS — E782 Mixed hyperlipidemia: Secondary | ICD-10-CM | POA: Diagnosis not present

## 2016-02-11 DIAGNOSIS — I10 Essential (primary) hypertension: Secondary | ICD-10-CM | POA: Diagnosis not present

## 2016-02-11 DIAGNOSIS — Z6839 Body mass index (BMI) 39.0-39.9, adult: Secondary | ICD-10-CM | POA: Diagnosis not present

## 2016-02-11 DIAGNOSIS — G894 Chronic pain syndrome: Secondary | ICD-10-CM | POA: Diagnosis not present

## 2016-02-11 DIAGNOSIS — Z1389 Encounter for screening for other disorder: Secondary | ICD-10-CM | POA: Diagnosis not present

## 2016-02-12 DIAGNOSIS — Z992 Dependence on renal dialysis: Secondary | ICD-10-CM | POA: Diagnosis not present

## 2016-02-12 DIAGNOSIS — N2581 Secondary hyperparathyroidism of renal origin: Secondary | ICD-10-CM | POA: Diagnosis not present

## 2016-02-12 DIAGNOSIS — N186 End stage renal disease: Secondary | ICD-10-CM | POA: Diagnosis not present

## 2016-02-12 DIAGNOSIS — D509 Iron deficiency anemia, unspecified: Secondary | ICD-10-CM | POA: Diagnosis not present

## 2016-02-14 DIAGNOSIS — Z992 Dependence on renal dialysis: Secondary | ICD-10-CM | POA: Diagnosis not present

## 2016-02-14 DIAGNOSIS — N2581 Secondary hyperparathyroidism of renal origin: Secondary | ICD-10-CM | POA: Diagnosis not present

## 2016-02-14 DIAGNOSIS — D509 Iron deficiency anemia, unspecified: Secondary | ICD-10-CM | POA: Diagnosis not present

## 2016-02-14 DIAGNOSIS — N186 End stage renal disease: Secondary | ICD-10-CM | POA: Diagnosis not present

## 2016-02-17 DIAGNOSIS — N2581 Secondary hyperparathyroidism of renal origin: Secondary | ICD-10-CM | POA: Diagnosis not present

## 2016-02-17 DIAGNOSIS — D509 Iron deficiency anemia, unspecified: Secondary | ICD-10-CM | POA: Diagnosis not present

## 2016-02-17 DIAGNOSIS — Z992 Dependence on renal dialysis: Secondary | ICD-10-CM | POA: Diagnosis not present

## 2016-02-17 DIAGNOSIS — N186 End stage renal disease: Secondary | ICD-10-CM | POA: Diagnosis not present

## 2016-02-19 DIAGNOSIS — N2581 Secondary hyperparathyroidism of renal origin: Secondary | ICD-10-CM | POA: Diagnosis not present

## 2016-02-19 DIAGNOSIS — D509 Iron deficiency anemia, unspecified: Secondary | ICD-10-CM | POA: Diagnosis not present

## 2016-02-19 DIAGNOSIS — N186 End stage renal disease: Secondary | ICD-10-CM | POA: Diagnosis not present

## 2016-02-19 DIAGNOSIS — Z992 Dependence on renal dialysis: Secondary | ICD-10-CM | POA: Diagnosis not present

## 2016-02-21 DIAGNOSIS — D509 Iron deficiency anemia, unspecified: Secondary | ICD-10-CM | POA: Diagnosis not present

## 2016-02-21 DIAGNOSIS — N2581 Secondary hyperparathyroidism of renal origin: Secondary | ICD-10-CM | POA: Diagnosis not present

## 2016-02-21 DIAGNOSIS — Z992 Dependence on renal dialysis: Secondary | ICD-10-CM | POA: Diagnosis not present

## 2016-02-21 DIAGNOSIS — N186 End stage renal disease: Secondary | ICD-10-CM | POA: Diagnosis not present

## 2016-02-24 DIAGNOSIS — N2581 Secondary hyperparathyroidism of renal origin: Secondary | ICD-10-CM | POA: Diagnosis not present

## 2016-02-24 DIAGNOSIS — D509 Iron deficiency anemia, unspecified: Secondary | ICD-10-CM | POA: Diagnosis not present

## 2016-02-24 DIAGNOSIS — Z992 Dependence on renal dialysis: Secondary | ICD-10-CM | POA: Diagnosis not present

## 2016-02-24 DIAGNOSIS — N186 End stage renal disease: Secondary | ICD-10-CM | POA: Diagnosis not present

## 2016-02-25 DIAGNOSIS — M898X8 Other specified disorders of bone, other site: Secondary | ICD-10-CM | POA: Diagnosis not present

## 2016-02-25 DIAGNOSIS — M25551 Pain in right hip: Secondary | ICD-10-CM | POA: Diagnosis not present

## 2016-02-26 DIAGNOSIS — D509 Iron deficiency anemia, unspecified: Secondary | ICD-10-CM | POA: Diagnosis not present

## 2016-02-26 DIAGNOSIS — N186 End stage renal disease: Secondary | ICD-10-CM | POA: Diagnosis not present

## 2016-02-26 DIAGNOSIS — Z992 Dependence on renal dialysis: Secondary | ICD-10-CM | POA: Diagnosis not present

## 2016-02-26 DIAGNOSIS — N2581 Secondary hyperparathyroidism of renal origin: Secondary | ICD-10-CM | POA: Diagnosis not present

## 2016-02-28 DIAGNOSIS — N2581 Secondary hyperparathyroidism of renal origin: Secondary | ICD-10-CM | POA: Diagnosis not present

## 2016-02-28 DIAGNOSIS — N186 End stage renal disease: Secondary | ICD-10-CM | POA: Diagnosis not present

## 2016-02-28 DIAGNOSIS — Z992 Dependence on renal dialysis: Secondary | ICD-10-CM | POA: Diagnosis not present

## 2016-02-28 DIAGNOSIS — D509 Iron deficiency anemia, unspecified: Secondary | ICD-10-CM | POA: Diagnosis not present

## 2016-03-02 DIAGNOSIS — N2581 Secondary hyperparathyroidism of renal origin: Secondary | ICD-10-CM | POA: Diagnosis not present

## 2016-03-02 DIAGNOSIS — Z992 Dependence on renal dialysis: Secondary | ICD-10-CM | POA: Diagnosis not present

## 2016-03-02 DIAGNOSIS — N186 End stage renal disease: Secondary | ICD-10-CM | POA: Diagnosis not present

## 2016-03-02 DIAGNOSIS — D509 Iron deficiency anemia, unspecified: Secondary | ICD-10-CM | POA: Diagnosis not present

## 2016-03-03 DIAGNOSIS — M4806 Spinal stenosis, lumbar region: Secondary | ICD-10-CM | POA: Diagnosis not present

## 2016-03-03 DIAGNOSIS — M5127 Other intervertebral disc displacement, lumbosacral region: Secondary | ICD-10-CM | POA: Diagnosis not present

## 2016-03-03 DIAGNOSIS — D1739 Benign lipomatous neoplasm of skin and subcutaneous tissue of other sites: Secondary | ICD-10-CM | POA: Diagnosis not present

## 2016-03-04 DIAGNOSIS — Z992 Dependence on renal dialysis: Secondary | ICD-10-CM | POA: Diagnosis not present

## 2016-03-04 DIAGNOSIS — D509 Iron deficiency anemia, unspecified: Secondary | ICD-10-CM | POA: Diagnosis not present

## 2016-03-04 DIAGNOSIS — N186 End stage renal disease: Secondary | ICD-10-CM | POA: Diagnosis not present

## 2016-03-04 DIAGNOSIS — N2581 Secondary hyperparathyroidism of renal origin: Secondary | ICD-10-CM | POA: Diagnosis not present

## 2016-03-06 DIAGNOSIS — D509 Iron deficiency anemia, unspecified: Secondary | ICD-10-CM | POA: Diagnosis not present

## 2016-03-06 DIAGNOSIS — Z992 Dependence on renal dialysis: Secondary | ICD-10-CM | POA: Diagnosis not present

## 2016-03-06 DIAGNOSIS — N2581 Secondary hyperparathyroidism of renal origin: Secondary | ICD-10-CM | POA: Diagnosis not present

## 2016-03-06 DIAGNOSIS — N186 End stage renal disease: Secondary | ICD-10-CM | POA: Diagnosis not present

## 2016-03-09 DIAGNOSIS — D509 Iron deficiency anemia, unspecified: Secondary | ICD-10-CM | POA: Diagnosis not present

## 2016-03-09 DIAGNOSIS — N186 End stage renal disease: Secondary | ICD-10-CM | POA: Diagnosis not present

## 2016-03-09 DIAGNOSIS — N2581 Secondary hyperparathyroidism of renal origin: Secondary | ICD-10-CM | POA: Diagnosis not present

## 2016-03-09 DIAGNOSIS — Z992 Dependence on renal dialysis: Secondary | ICD-10-CM | POA: Diagnosis not present

## 2016-03-11 DIAGNOSIS — Z992 Dependence on renal dialysis: Secondary | ICD-10-CM | POA: Diagnosis not present

## 2016-03-11 DIAGNOSIS — N2581 Secondary hyperparathyroidism of renal origin: Secondary | ICD-10-CM | POA: Diagnosis not present

## 2016-03-11 DIAGNOSIS — D509 Iron deficiency anemia, unspecified: Secondary | ICD-10-CM | POA: Diagnosis not present

## 2016-03-11 DIAGNOSIS — N186 End stage renal disease: Secondary | ICD-10-CM | POA: Diagnosis not present

## 2016-03-13 DIAGNOSIS — N186 End stage renal disease: Secondary | ICD-10-CM | POA: Diagnosis not present

## 2016-03-13 DIAGNOSIS — N2581 Secondary hyperparathyroidism of renal origin: Secondary | ICD-10-CM | POA: Diagnosis not present

## 2016-03-13 DIAGNOSIS — D509 Iron deficiency anemia, unspecified: Secondary | ICD-10-CM | POA: Diagnosis not present

## 2016-03-13 DIAGNOSIS — Z992 Dependence on renal dialysis: Secondary | ICD-10-CM | POA: Diagnosis not present

## 2016-03-16 DIAGNOSIS — Z992 Dependence on renal dialysis: Secondary | ICD-10-CM | POA: Diagnosis not present

## 2016-03-16 DIAGNOSIS — N2581 Secondary hyperparathyroidism of renal origin: Secondary | ICD-10-CM | POA: Diagnosis not present

## 2016-03-16 DIAGNOSIS — D509 Iron deficiency anemia, unspecified: Secondary | ICD-10-CM | POA: Diagnosis not present

## 2016-03-16 DIAGNOSIS — N186 End stage renal disease: Secondary | ICD-10-CM | POA: Diagnosis not present

## 2016-03-18 DIAGNOSIS — D509 Iron deficiency anemia, unspecified: Secondary | ICD-10-CM | POA: Diagnosis not present

## 2016-03-18 DIAGNOSIS — N2581 Secondary hyperparathyroidism of renal origin: Secondary | ICD-10-CM | POA: Diagnosis not present

## 2016-03-18 DIAGNOSIS — N186 End stage renal disease: Secondary | ICD-10-CM | POA: Diagnosis not present

## 2016-03-18 DIAGNOSIS — Z992 Dependence on renal dialysis: Secondary | ICD-10-CM | POA: Diagnosis not present

## 2016-03-20 DIAGNOSIS — N2581 Secondary hyperparathyroidism of renal origin: Secondary | ICD-10-CM | POA: Diagnosis not present

## 2016-03-20 DIAGNOSIS — D509 Iron deficiency anemia, unspecified: Secondary | ICD-10-CM | POA: Diagnosis not present

## 2016-03-20 DIAGNOSIS — N186 End stage renal disease: Secondary | ICD-10-CM | POA: Diagnosis not present

## 2016-03-20 DIAGNOSIS — Z992 Dependence on renal dialysis: Secondary | ICD-10-CM | POA: Diagnosis not present

## 2016-03-21 DIAGNOSIS — M898X8 Other specified disorders of bone, other site: Secondary | ICD-10-CM | POA: Diagnosis not present

## 2016-03-21 DIAGNOSIS — M25551 Pain in right hip: Secondary | ICD-10-CM | POA: Diagnosis not present

## 2016-03-23 DIAGNOSIS — N2581 Secondary hyperparathyroidism of renal origin: Secondary | ICD-10-CM | POA: Diagnosis not present

## 2016-03-23 DIAGNOSIS — D509 Iron deficiency anemia, unspecified: Secondary | ICD-10-CM | POA: Diagnosis not present

## 2016-03-23 DIAGNOSIS — Z992 Dependence on renal dialysis: Secondary | ICD-10-CM | POA: Diagnosis not present

## 2016-03-23 DIAGNOSIS — N186 End stage renal disease: Secondary | ICD-10-CM | POA: Diagnosis not present

## 2016-03-25 DIAGNOSIS — D509 Iron deficiency anemia, unspecified: Secondary | ICD-10-CM | POA: Diagnosis not present

## 2016-03-25 DIAGNOSIS — N186 End stage renal disease: Secondary | ICD-10-CM | POA: Diagnosis not present

## 2016-03-25 DIAGNOSIS — N2581 Secondary hyperparathyroidism of renal origin: Secondary | ICD-10-CM | POA: Diagnosis not present

## 2016-03-25 DIAGNOSIS — Z992 Dependence on renal dialysis: Secondary | ICD-10-CM | POA: Diagnosis not present

## 2016-03-27 DIAGNOSIS — D509 Iron deficiency anemia, unspecified: Secondary | ICD-10-CM | POA: Diagnosis not present

## 2016-03-27 DIAGNOSIS — N2581 Secondary hyperparathyroidism of renal origin: Secondary | ICD-10-CM | POA: Diagnosis not present

## 2016-03-27 DIAGNOSIS — N186 End stage renal disease: Secondary | ICD-10-CM | POA: Diagnosis not present

## 2016-03-27 DIAGNOSIS — Z992 Dependence on renal dialysis: Secondary | ICD-10-CM | POA: Diagnosis not present

## 2016-03-30 DIAGNOSIS — N186 End stage renal disease: Secondary | ICD-10-CM | POA: Diagnosis not present

## 2016-03-30 DIAGNOSIS — D509 Iron deficiency anemia, unspecified: Secondary | ICD-10-CM | POA: Diagnosis not present

## 2016-03-30 DIAGNOSIS — N2581 Secondary hyperparathyroidism of renal origin: Secondary | ICD-10-CM | POA: Diagnosis not present

## 2016-03-30 DIAGNOSIS — Z992 Dependence on renal dialysis: Secondary | ICD-10-CM | POA: Diagnosis not present

## 2016-03-31 DIAGNOSIS — M25551 Pain in right hip: Secondary | ICD-10-CM | POA: Diagnosis not present

## 2016-03-31 DIAGNOSIS — M1991 Primary osteoarthritis, unspecified site: Secondary | ICD-10-CM | POA: Diagnosis not present

## 2016-03-31 DIAGNOSIS — Z6839 Body mass index (BMI) 39.0-39.9, adult: Secondary | ICD-10-CM | POA: Diagnosis not present

## 2016-04-02 DIAGNOSIS — Z79899 Other long term (current) drug therapy: Secondary | ICD-10-CM | POA: Diagnosis not present

## 2016-04-02 DIAGNOSIS — I110 Hypertensive heart disease with heart failure: Secondary | ICD-10-CM | POA: Diagnosis not present

## 2016-04-02 DIAGNOSIS — Z7982 Long term (current) use of aspirin: Secondary | ICD-10-CM | POA: Diagnosis not present

## 2016-04-02 DIAGNOSIS — Z992 Dependence on renal dialysis: Secondary | ICD-10-CM | POA: Diagnosis not present

## 2016-04-02 DIAGNOSIS — I509 Heart failure, unspecified: Secondary | ICD-10-CM | POA: Diagnosis not present

## 2016-04-02 DIAGNOSIS — M545 Low back pain: Secondary | ICD-10-CM | POA: Diagnosis not present

## 2016-04-02 DIAGNOSIS — Z8249 Family history of ischemic heart disease and other diseases of the circulatory system: Secondary | ICD-10-CM | POA: Diagnosis not present

## 2016-04-02 DIAGNOSIS — M25551 Pain in right hip: Secondary | ICD-10-CM | POA: Diagnosis not present

## 2016-04-02 DIAGNOSIS — N186 End stage renal disease: Secondary | ICD-10-CM | POA: Diagnosis not present

## 2016-04-02 DIAGNOSIS — E119 Type 2 diabetes mellitus without complications: Secondary | ICD-10-CM | POA: Diagnosis not present

## 2016-04-02 DIAGNOSIS — Z794 Long term (current) use of insulin: Secondary | ICD-10-CM | POA: Diagnosis not present

## 2016-04-02 DIAGNOSIS — Z833 Family history of diabetes mellitus: Secondary | ICD-10-CM | POA: Diagnosis not present

## 2016-04-03 DIAGNOSIS — D509 Iron deficiency anemia, unspecified: Secondary | ICD-10-CM | POA: Diagnosis not present

## 2016-04-03 DIAGNOSIS — N186 End stage renal disease: Secondary | ICD-10-CM | POA: Diagnosis not present

## 2016-04-03 DIAGNOSIS — N2581 Secondary hyperparathyroidism of renal origin: Secondary | ICD-10-CM | POA: Diagnosis not present

## 2016-04-03 DIAGNOSIS — Z23 Encounter for immunization: Secondary | ICD-10-CM | POA: Diagnosis not present

## 2016-04-03 DIAGNOSIS — Z992 Dependence on renal dialysis: Secondary | ICD-10-CM | POA: Diagnosis not present

## 2016-04-05 ENCOUNTER — Encounter (HOSPITAL_COMMUNITY): Payer: Self-pay | Admitting: *Deleted

## 2016-04-05 ENCOUNTER — Emergency Department (HOSPITAL_COMMUNITY): Payer: Medicare Other

## 2016-04-05 ENCOUNTER — Observation Stay (HOSPITAL_COMMUNITY)
Admission: EM | Admit: 2016-04-05 | Discharge: 2016-04-06 | Disposition: A | Discharge status: Home / Self Care | Payer: Medicare Other | Attending: Family Medicine | Admitting: Family Medicine

## 2016-04-05 DIAGNOSIS — I10 Essential (primary) hypertension: Secondary | ICD-10-CM | POA: Diagnosis not present

## 2016-04-05 DIAGNOSIS — E872 Acidosis: Secondary | ICD-10-CM | POA: Insufficient documentation

## 2016-04-05 DIAGNOSIS — I429 Cardiomyopathy, unspecified: Secondary | ICD-10-CM | POA: Insufficient documentation

## 2016-04-05 DIAGNOSIS — Z794 Long term (current) use of insulin: Secondary | ICD-10-CM | POA: Insufficient documentation

## 2016-04-05 DIAGNOSIS — I132 Hypertensive heart and chronic kidney disease with heart failure and with stage 5 chronic kidney disease, or end stage renal disease: Secondary | ICD-10-CM | POA: Insufficient documentation

## 2016-04-05 DIAGNOSIS — I5022 Chronic systolic (congestive) heart failure: Secondary | ICD-10-CM | POA: Insufficient documentation

## 2016-04-05 DIAGNOSIS — Z79899 Other long term (current) drug therapy: Secondary | ICD-10-CM | POA: Diagnosis not present

## 2016-04-05 DIAGNOSIS — Z7982 Long term (current) use of aspirin: Secondary | ICD-10-CM | POA: Insufficient documentation

## 2016-04-05 DIAGNOSIS — E871 Hypo-osmolality and hyponatremia: Secondary | ICD-10-CM | POA: Diagnosis not present

## 2016-04-05 DIAGNOSIS — E875 Hyperkalemia: Secondary | ICD-10-CM | POA: Diagnosis not present

## 2016-04-05 DIAGNOSIS — Z8673 Personal history of transient ischemic attack (TIA), and cerebral infarction without residual deficits: Secondary | ICD-10-CM | POA: Insufficient documentation

## 2016-04-05 DIAGNOSIS — D631 Anemia in chronic kidney disease: Secondary | ICD-10-CM | POA: Diagnosis not present

## 2016-04-05 DIAGNOSIS — M1990 Unspecified osteoarthritis, unspecified site: Secondary | ICD-10-CM | POA: Diagnosis not present

## 2016-04-05 DIAGNOSIS — Z992 Dependence on renal dialysis: Secondary | ICD-10-CM

## 2016-04-05 DIAGNOSIS — K219 Gastro-esophageal reflux disease without esophagitis: Secondary | ICD-10-CM | POA: Insufficient documentation

## 2016-04-05 DIAGNOSIS — R0602 Shortness of breath: Secondary | ICD-10-CM

## 2016-04-05 DIAGNOSIS — R531 Weakness: Secondary | ICD-10-CM | POA: Diagnosis not present

## 2016-04-05 DIAGNOSIS — N186 End stage renal disease: Secondary | ICD-10-CM | POA: Diagnosis not present

## 2016-04-05 DIAGNOSIS — IMO0001 Reserved for inherently not codable concepts without codable children: Secondary | ICD-10-CM | POA: Diagnosis present

## 2016-04-05 DIAGNOSIS — R200 Anesthesia of skin: Secondary | ICD-10-CM | POA: Insufficient documentation

## 2016-04-05 DIAGNOSIS — E1122 Type 2 diabetes mellitus with diabetic chronic kidney disease: Secondary | ICD-10-CM | POA: Insufficient documentation

## 2016-04-05 LAB — BASIC METABOLIC PANEL
ANION GAP: 22 — AB (ref 5–15)
BUN: 102 mg/dL — ABNORMAL HIGH (ref 6–20)
CHLORIDE: 90 mmol/L — AB (ref 101–111)
CO2: 16 mmol/L — ABNORMAL LOW (ref 22–32)
Calcium: 8.5 mg/dL — ABNORMAL LOW (ref 8.9–10.3)
Creatinine, Ser: 25.24 mg/dL — ABNORMAL HIGH (ref 0.61–1.24)
GFR calc non Af Amer: 2 mL/min — ABNORMAL LOW (ref >60)
GFR, EST AFRICAN AMERICAN: 2 mL/min — AB (ref >60)
Glucose, Bld: 95 mg/dL (ref 65–99)
POTASSIUM: 7.4 mmol/L — AB (ref 3.5–5.1)
SODIUM: 128 mmol/L — AB (ref 135–145)

## 2016-04-05 LAB — CBC WITH DIFFERENTIAL/PLATELET
BASOS ABS: 0.1 10*3/uL (ref 0.0–0.1)
Basophils Relative: 1 %
Eosinophils Absolute: 1.3 10*3/uL — ABNORMAL HIGH (ref 0.0–0.7)
Eosinophils Relative: 16 %
HEMATOCRIT: 37.5 % — AB (ref 39.0–52.0)
Hemoglobin: 12.2 g/dL — ABNORMAL LOW (ref 13.0–17.0)
LYMPHS ABS: 1.1 10*3/uL (ref 0.7–4.0)
LYMPHS PCT: 14 %
MCH: 29.6 pg (ref 26.0–34.0)
MCHC: 32.5 g/dL (ref 30.0–36.0)
MCV: 91 fL (ref 78.0–100.0)
MONO ABS: 0.7 10*3/uL (ref 0.1–1.0)
Monocytes Relative: 8 %
NEUTROS ABS: 4.8 10*3/uL (ref 1.7–7.7)
Neutrophils Relative %: 61 %
Platelets: 309 10*3/uL (ref 150–400)
RBC: 4.12 MIL/uL — AB (ref 4.22–5.81)
RDW: 14.3 % (ref 11.5–15.5)
WBC: 7.9 10*3/uL (ref 4.0–10.5)

## 2016-04-05 LAB — TROPONIN I: TROPONIN I: 0.03 ng/mL — AB (ref <0.03)

## 2016-04-05 LAB — CBG MONITORING, ED: Glucose-Capillary: 91 mg/dL (ref 65–99)

## 2016-04-05 LAB — POTASSIUM: POTASSIUM: 7 mmol/L — AB (ref 3.5–5.1)

## 2016-04-05 MED ORDER — INSULIN ASPART 100 UNIT/ML ~~LOC~~ SOLN
SUBCUTANEOUS | Status: AC
  Filled 2016-04-05: qty 1

## 2016-04-05 MED ORDER — DEXTROSE 50 % IV SOLN
INTRAVENOUS | Status: AC
  Filled 2016-04-05: qty 50

## 2016-04-05 MED ORDER — INSULIN ASPART 100 UNIT/ML IV SOLN
10.0000 [IU] | Freq: Once | INTRAVENOUS | Status: AC
Start: 2016-04-05 — End: 2016-04-05
  Administered 2016-04-05: 10 [IU] via INTRAVENOUS

## 2016-04-05 MED ORDER — SODIUM BICARBONATE 8.4 % IV SOLN
50.0000 meq | Freq: Once | INTRAVENOUS | Status: AC
  Administered 2016-04-05: 50 meq via INTRAVENOUS
  Filled 2016-04-05: qty 50

## 2016-04-05 MED ORDER — CALCIUM GLUCONATE 10 % IV SOLN
1.0000 g | Freq: Once | INTRAVENOUS | Status: AC
  Administered 2016-04-05: 1 g via INTRAVENOUS
  Filled 2016-04-05: qty 10

## 2016-04-05 MED ORDER — DEXTROSE 50 % IV SOLN
1.0000 | Freq: Once | INTRAVENOUS | Status: AC
  Administered 2016-04-05: 50 mL via INTRAVENOUS

## 2016-04-05 NOTE — ED Notes (Signed)
CRITICAL VALUE ALERT  Critical value received:  Potassium 7.0  Date of notification:  04/05/2016  Time of notification:  2350  Critical value read back:Yes.    Nurse who received alert:  Fabio Neighbors RN  MD notified (1st page):  Dr. Wyvonnia Dusky  Time of first page:  2350  MD notified (2nd page):  Time of second page:  Responding MD:    Time MD responded:

## 2016-04-05 NOTE — ED Provider Notes (Signed)
Patient complains of SOB 2-3 days. He states he feels mildly SOB currently. He notes some lower back pain, occasional vomiting and abdominal pain as well. Pt is a dialysis patient and has sessions every MWF; he missed his session last Wednesday. Pt has been undergoing dialysis treatment for 8 years. He does not make urine. He notes a h/o diabetes.  Pt also reports bilateral leg weakness and numbness for the past week. Pt normally ambulates with a cane. He lives alone. He denies CP, fever, cough, or any other associated symptoms.  No distress. Obese. Dialysis fistula right forearm with thrill. Potassium of 7. 4/5 strength of lower extremities bilaterally. Intact DP and PT pulses with doppler. Decreased sensation to feet bilaterally.  Dialysis patient with increased SOB x several days.  MIssed dialysis 5 days ago, but went 2 days ago.  No chest pain or fever.  Has some tingling in bilateral legs. No focal weakness, no incontinence.  Hyperkalemia without EKG changes.  Calcium, bicarb, insulin, glucose.  D/w Dr. Theador Hawthorne for emergent dialysis  CRITICAL CARE Performed by: Ezequiel Essex Total critical care time: 30 minutes Critical care time was exclusive of separately billable procedures and treating other patients. Critical care was necessary to treat or prevent imminent or life-threatening deterioration. Critical care was time spent personally by me on the following activities: development of treatment plan with patient and/or surrogate as well as nursing, discussions with consultants, evaluation of patient's response to treatment, examination of patient, obtaining history from patient or surrogate, ordering and performing treatments and interventions, ordering and review of laboratory studies, ordering and review of radiographic studies, pulse oximetry and re-evaluation of patient's condition.   I personally performed the services described in this documentation, which was scribed in my presence.  The recorded information has been reviewed and is accurate.    Ezequiel Essex, MD 04/06/16 [phone]

## 2016-04-05 NOTE — ED Provider Notes (Signed)
[facility] DEPT Provider Note   CSN: [id number] Arrival date & time: 04/05/16  2032     History   Chief Complaint Chief Complaint  Patient presents with  . Shortness of Breath    HPI Jacob [name] is a 65 y.o. male.  Patient with PMH of ESRD on HD (MWF), DM, stroke, presents to the ED with a chief complaint of SOB and leg weakness.  1. SOB:  Patient reports having worsening SOB for the past 2 days.  Denies associated chest pain, cough, or fever.  Last HD on Friday.  Next is tomorrow.  No modifying factors.  No other associated symptoms.  2. Leg weakness/numbness: Reports increased leg weakness and numbness for the past week.  States that he normally walks with a cane, but has had to use a rolator this week.  He states that his legs feel numb.  He denies any headache, slurred speech.  There are no modifying factors. He also has a history of herniated disc.  Denies incontinence.   The history is provided by the patient. No language interpreter was used.    Past Medical History:  Diagnosis Date  . Anemia in chronic kidney disease(285.21)   . Arthritis   . Chronic systolic heart failure ([place])   . End stage renal disease ([place])    Hemodialysis on Monday, Wednesday, Friday- in [place]  . Essential hypertension, benign   . GERD (gastroesophageal reflux disease)    sometimes- not taking omperazole  . Glaucoma   . History of blood transfusion   . Nonischemic cardiomyopathy ([place])    Hypertension and regular alcohol use, LVEF 40-45%  . Shortness of breath dyspnea    with exertion  . Stroke [facility]) 2016   right shoulder weakness  . Type 2 diabetes mellitus [facility])     Patient Active Problem List   Diagnosis Date Noted  . End stage renal disease ([place]) Jul 11, 202014  . Other complications due to renal dialysis device, implant, and graft 04/28/2013  . Subclavian vein occlusion ([facility]) 04/28/2013  . Preoperative cardiovascular examination 12/28/2012  . Mixed hyperlipidemia 06/16/2011  .  Nonischemic cardiomyopathy ([place]) 11/20/2010  . End-stage renal disease on hemodialysis ([place]) 11/20/2010  . Essential hypertension, benign 11/20/2010    Past Surgical History:  Procedure Laterality Date  . AV FISTULA PLACEMENT     Hx: of  . AV FISTULA PLACEMENT Right 05/04/2013   Procedure: ARTERIOVENOUS (AV) FISTULA CREATION- RIGHT ARM;  Surgeon: Conrad Costilla, MD;  Location: [place];  Service: Vascular;  Laterality: Right;  Ultrasound guided  . CATARACT EXTRACTION W/PHACO Left 07/16/2014   Procedure: CATARACT EXTRACTION PHACO AND INTRAOCULAR LENS PLACEMENT (IOC);  Surgeon: [name], MD;  [facility];  Service: Ophthalmology;  Laterality: Left;  CDE:8.86  . CATARACT EXTRACTION W/PHACO Right 07/30/2014   Procedure: CATARACT EXTRACTION PHACO AND INTRAOCULAR LENS PLACEMENT (IOC);  Surgeon: [name], MD;  [facility]: [facility];  Service: Ophthalmology;  Laterality: Right;  CDE 8.99  . COLONOSCOPY     Hx: of  . FISTULOGRAM N/A 05/04/2013   Procedure: CENTRAL VENOGRAM;  Surgeon: Conrad [place], MD;  Location: [place];  Service: Vascular;  Laterality: N/A;  . INSERTION OF DIALYSIS CATHETER Right 05/04/2013   Procedure: INSERTION OF DIALYSIS CATHETER;  Surgeon: Conrad Thayer, MD;  Location: [place];  Service: Vascular;  Laterality: Right;  Ultrasound guided  . LIGATION OF ARTERIOVENOUS  FISTULA Left 05/04/2013   Procedure: LIGATION OF LEFT RADIAL CEPHALIC ARTERIOVENOUS  FISTULA;  Surgeon:  Conrad [place], MD;  Location: [place];  Service: Vascular;  Laterality: Left;  Ultrasound guided  . LUMBAR SPINE SURGERY    . PERIPHERAL VASCULAR CATHETERIZATION N/A 01/24/2015   Procedure: Fistulagram;  Surgeon: Conrad [place], MD;  Location: [place] CV LAB;  Service: Cardiovascular;  Laterality: N/A;  . REVISON OF ARTERIOVENOUS FISTULA Right 01/02/2014   Procedure: REVISON OF ARTERIOVENOUS FISTULA ANASTOMOSIS;  Surgeon: Conrad [place], MD;  Location: [place];  Service: Vascular;  Laterality: Right;  . REVISON OF ARTERIOVENOUS  FISTULA Right 01/02/2016   Procedure: REVISION OF RADIOCEPHALIC ARTERIOVENOUS FISTULA  with BOVINE PATCH ANGIOPLASTY RIGHT RADIAL ARTERY;  Surgeon: Mal [name], MD;  Location: [place];  Service: Vascular;  Laterality: Right;  . SHUNTOGRAM N/A 11/07/2013   Procedure: Fistulogram;  Surgeon: [name], MD;  Location: [facility] CATH LAB;  Service: Cardiovascular;  Laterality: N/A;       Home Medications    Prior to Admission medications   Medication Sig Start Date End Date Taking? Authorizing Provider  acetaminophen (TYLENOL) 500 MG tablet Take 1,000 mg by mouth every 6 (six) hours as needed for mild pain.    Historical Provider, MD  aspirin EC 325 MG tablet Take 325 mg by mouth daily.    Historical Provider, MD  B Complex-C-Folic Acid (DIALYVITE TABLET) TABS Take 1 tablet by mouth every Monday, Wednesday, and Friday.     Historical Provider, MD  carvedilol (COREG) 12.5 MG tablet Take 12.5 mg by mouth every morning.     Historical Provider, MD  EPIPEN 2-PAK 0.3 MG/0.3ML SOAJ injection Inject 0.3 mg into the muscle once as needed (For anaphylaxis.).  04/18/13   Historical Provider, MD  HYDROcodone-acetaminophen (NORCO/VICODIN) 5-325 MG tablet Take 1 tablet by mouth every 6 (six) hours as needed for moderate pain. 01/02/16   Samantha J Rhyne, PA-C  LANTUS SOLOSTAR 100 UNIT/ML Solostar Pen Inject 38 Units into the skin at bedtime.  01/15/15   Historical Provider, MD  omeprazole (PRILOSEC) 40 MG capsule Take 40 mg by mouth daily.      Historical Provider, MD  ramipril (ALTACE) 2.5 MG capsule Take 2.5 mg by mouth daily.    Historical Provider, MD  RENVELA 800 MG tablet Take 800 mg by mouth 3 (three) times daily with meals.  05/25/13   Historical Provider, MD  zolpidem (AMBIEN) 10 MG tablet Take 10 mg by mouth at bedtime as needed for sleep.  11/09/15   Historical Provider, MD    Family History Family History  Problem Relation Age of Onset  . Heart attack Brother     33  . Heart disease Brother      before age 53  . Hypertension Brother   . Heart attack Brother     76  . Heart attack Brother     29  . Diabetes Mother   . Hypertension Mother   . Heart disease Mother   . Heart disease Father   . Hypertension Father   . Other Father     amputation  . Hypertension Sister   . Heart disease Sister   . Vision loss Maternal Uncle     Social History Social History  Substance Use Topics  . Smoking status: Never Smoker  . Smokeless tobacco: Never Used  . Alcohol use Yes     Comment: 1- fifth of gin a wek     Allergies   Bee venom and Penicillins   Review of Systems Review of Systems  Respiratory:  Positive for shortness of breath.   Neurological: Positive for weakness.  All other systems reviewed and are negative.    Physical Exam Updated Vital Signs BP 149/58   Pulse 80   Temp 98.2 F (36.8 C) (Oral)   Resp 21   Ht 6\' 2"  (1.88 m)   Wt (!) 138.3 kg   SpO2 96%   BMI 39.16 kg/m   Physical Exam  Constitutional: He is oriented to person, place, and time. He appears well-developed and well-nourished.  HENT:  Head: Normocephalic and atraumatic.  Eyes: Conjunctivae and EOM are normal. Pupils are equal, round, and reactive to light. Right eye exhibits no discharge. Left eye exhibits no discharge. No scleral icterus.  Neck: Normal range of motion. Neck supple. No JVD present.  Cardiovascular: Normal rate, regular rhythm and normal heart sounds.  Exam reveals no gallop and no friction rub.   No murmur heard. Pulmonary/Chest: Effort normal and breath sounds normal. No respiratory distress. He has no wheezes. He has no rales. He exhibits no tenderness.  CTAB  Abdominal: Soft. He exhibits no distension and no mass. There is no tenderness. There is no rebound and no guarding.  Musculoskeletal: Normal range of motion. He exhibits no edema or tenderness.  Decreased strength of bilateral lower extremities (4/5) Upper extremities 5/5  Neurological: He is alert and oriented  to person, place, and time.  CN 3-12 intact Speech is clear Movements are goal oriented   Skin: Skin is warm and dry.  Psychiatric: He has a normal mood and affect. His behavior is normal. Judgment and thought content normal.  Nursing note and vitals reviewed.    ED Treatments / Results  Labs (all labs ordered are listed, but only abnormal results are displayed) Labs Reviewed - No data to display  EKG  EKG Interpretation None       Radiology No results found.  Procedures Procedures (including critical care time)  Medications Ordered in ED Medications - No data to display   Initial Impression / Assessment and Plan / ED Course  I have reviewed the triage vital signs and the nursing notes.  Pertinent labs & imaging results that were available during my care of the patient were reviewed by me and considered in my medical decision making (see chart for details).  Clinical Course    Patient with SOB.  Will check labs and CXR.  Last HD on Friday.  Missed last Wednesday.  K is 7.4.  Patient will need emergent dialysis.  Patient discussed with Dr. Theador Hawthorne, who will dialyze the patient.  Patient seen by and discussed with Dr. Wyvonnia Dusky.  Patient will be admitted for observation overnight.  CRITICAL CARE Performed by: Montine Circle   Total critical care time: 35 minutes  Critical care time was exclusive of separately billable procedures and treating other patients.  Critical care was necessary to treat or prevent imminent or life-threatening deterioration.  Critical care was time spent personally by me on the following activities: development of treatment plan with patient and/or surrogate as well as nursing, discussions with consultants, evaluation of patient's response to treatment, examination of patient, obtaining history from patient or surrogate, ordering and performing treatments and interventions, ordering and review of laboratory studies, ordering and review  of radiographic studies, pulse oximetry and re-evaluation of patient's condition.  Final Clinical Impressions(s) / ED Diagnoses   Final diagnoses:  Hyperkalemia  Shortness of breath    New Prescriptions New Prescriptions   No medications on file  Montine Circle, PA-C 04/05/16 [place], MD 04/06/16 [phone]

## 2016-04-05 NOTE — ED Notes (Signed)
Patient transported to CT 

## 2016-04-05 NOTE — ED Notes (Signed)
Lab at bedside for repeat potassium

## 2016-04-05 NOTE — ED Triage Notes (Signed)
Pt c/o sob that started two days ago, is dialysis pt, mon, wed, Friday, pt reports that dialysis was normal on Friday, pt also c/o hip pain, was seen at [facility] on Wednesday, given lodine and lortab with little improvement in symptoms,

## 2016-04-05 NOTE — Consult Note (Signed)
CAELEN NYBORG MRN: [id number] DOB/AGE: [birth year] 65 y.o. Primary Care Physician:GOLDING, Betsy Coder, MD Admit date: 04/05/2016 Chief Complaint:  Chief Complaint  Patient presents with  . Shortness of Breath   HPI: Pt is 65 year old male with past medical hx of ESRD who came to ER with c/o shortness of breath  HPI of dates back to past 2-3 days when pt started feeling short of brath, it was  Progressive and so he came to ER.Upo evaluation in ER pt was found to have Hyperkalemia and nephrology ws consulted. Pt seen in ER Pt main concern is shortness of breath. Pt also c/p tingling in feet  NO c/o fever/cough/chill s NO c/o nausea/vomiting/abdominal pain NO c/o syncope     Past Medical History:  Diagnosis Date  . Anemia in chronic kidney disease(285.21)   . Arthritis   . Chronic systolic heart failure ([place])   . End stage renal disease ([place])    Hemodialysis on Monday, Wednesday, Friday- in [place]  . Essential hypertension, benign   . GERD (gastroesophageal reflux disease)    sometimes- not taking omperazole  . Glaucoma   . History of blood transfusion   . Nonischemic cardiomyopathy ([place])    Hypertension and regular alcohol use, LVEF 40-45%  . Shortness of breath dyspnea    with exertion  . Stroke [facility]) 2016   right shoulder weakness  . Type 2 diabetes mellitus (HCC)         Family History  Problem Relation Age of Onset  . Heart attack Brother     10  . Heart disease Brother     before age 32  . Hypertension Brother   . Heart attack Brother     18  . Heart attack Brother     39  . Diabetes Mother   . Hypertension Mother   . Heart disease Mother   . Heart disease Father   . Hypertension Father   . Other Father     amputation  . Hypertension Sister   . Heart disease Sister   . Vision loss Maternal Uncle     Social History:  reports that he has never smoked. He has never used smokeless tobacco. He reports that he drinks alcohol. He reports that he does not use  drugs.   Allergies:  Allergies  Allergen Reactions  . Bee Venom Anaphylaxis  . Penicillins Swelling    Swelling of throat and tongue Has patient had a PCN reaction causing immediate rash, facial/tongue/throat swelling, SOB or lightheadedness with hypotension: No Has patient had a PCN reaction causing severe rash involving mucus membranes or skin necrosis: No Has patient had a PCN reaction that required hospitalization No Has patient had a PCN reaction occurring within the last 10 years: No If all of the above answers are "NO", then may proceed with Cephalosporin use.     (Not in a hospital admission)     [id number] from the symptoms mentioned above,there are no other symptoms referable to all systems reviewed.    Physical Exam: Vital signs in last 24 hours: Temp:  [98.2 F (36.8 C)] 98.2 F (36.8 C) (09/03 2048) Pulse Rate:  [75-81] 81 (09/03 2311) Resp:  [16-21] 16 (09/03 2311) BP: (145-155)/(58-96) 148/78 (09/03 2311) SpO2:  [96 %-100 %] 97 % (09/03 2311) Weight:  [305 lb (138.3 kg)] 305 lb (138.3 kg) (09/03 2047) Weight change:     Intake/Output from previous day: No intake/output data recorded. No intake/output data recorded.   Physical  Exam: General- pt is awake,alert, oriented to time place and person Resp- No acute REsp distress, Decreased at bases CVS- S1S2 regular in rate and rhythm GIT- BS+, soft, NT, ND EXT- NO LE Edema, Cyanosis CNS- CN 2-12 grossly intact. Moving all 4 extremities Access- AVF   Lab Results: CBC  Recent Labs  04/05/16 2112  WBC 7.9  HGB 12.2*  HCT 37.5*  PLT 309    BMET  Recent Labs  04/05/16 2112  NA 128*  K 7.4*  CL 90*  CO2 16*  GLUCOSE 95  BUN 102*  CREATININE 25.24*  CALCIUM 8.5*   Anion gap 128-106=22 Delta AG presuming alb of 3 22-9=13 Delta Bicarb 24-16=8   MICRO No results found for this or any previous visit (from the past 240 hour(s)).    Lab Results  Component Value Date   CALCIUM 8.5  (L) 04/05/2016   CAION 0.98 (L) 01/24/2015  alb 3.5 Corrected cal 8.9    Impression: 1)Renal  ESRD on HD.                Pt is on Mon/Wed/Fri schedule                 Pt does have hx of no adherence to tx                Will dialyze pt tonight            2)HTN BP on higher side HD should help  3)Anemia IN ESRD the goal for HGb is 9-11 NO need of Epo  4)CKD Mineral-Bone Disorder Phosphorus will check Calcium at goal   5)REsp-admitted with shortness of breath Primary MD following  6)Electrolytes  Hyperkalemic  Hyponatremic    7)Acid base Co2 not at goal High AG acidosis     Plan:  Will dialyze today Will use 2 k bath Will try to take 3 liters off NO need of EPO Will ask for lactate Will ask for albumin  Addendum Pt seen on HD Pt tolerating tx well   BHUTANI,MANPREET S 04/05/2016, 11:18 PM

## 2016-04-05 NOTE — ED Notes (Signed)
CRITICAL VALUE ALERT  Critical value received:  Trop 0.03, K 7.4  Date of notification:  04/05/16  Time of notification:  2207  Critical value read back:Yes.    Nurse who received alert:  B. Olena Heckle, RN  MD notified (1st page):  R[place]  Time of first page:  2208   **

## 2016-04-06 ENCOUNTER — Encounter (HOSPITAL_COMMUNITY): Payer: Self-pay | Admitting: *Deleted

## 2016-04-06 DIAGNOSIS — I10 Essential (primary) hypertension: Secondary | ICD-10-CM | POA: Diagnosis not present

## 2016-04-06 DIAGNOSIS — T888XXS Other specified complications of surgical and medical care, not elsewhere classified, sequela: Secondary | ICD-10-CM

## 2016-04-06 DIAGNOSIS — N186 End stage renal disease: Secondary | ICD-10-CM

## 2016-04-06 DIAGNOSIS — E875 Hyperkalemia: Secondary | ICD-10-CM | POA: Diagnosis not present

## 2016-04-06 DIAGNOSIS — Z992 Dependence on renal dialysis: Secondary | ICD-10-CM

## 2016-04-06 DIAGNOSIS — D631 Anemia in chronic kidney disease: Secondary | ICD-10-CM | POA: Diagnosis not present

## 2016-04-06 LAB — CBC
HEMATOCRIT: 35.3 % — AB (ref 39.0–52.0)
HEMOGLOBIN: 11.6 g/dL — AB (ref 13.0–17.0)
MCH: 29.6 pg (ref 26.0–34.0)
MCHC: 32.9 g/dL (ref 30.0–36.0)
MCV: 90.1 fL (ref 78.0–100.0)
Platelets: 282 10*3/uL (ref 150–400)
RBC: 3.92 MIL/uL — AB (ref 4.22–5.81)
RDW: 13.3 % (ref 11.5–15.5)
WBC: 5.8 10*3/uL (ref 4.0–10.5)

## 2016-04-06 LAB — ALBUMIN: Albumin: 3.5 g/dL (ref 3.5–5.0)

## 2016-04-06 LAB — BASIC METABOLIC PANEL
ANION GAP: 18 — AB (ref 5–15)
BUN: 82 mg/dL — ABNORMAL HIGH (ref 6–20)
CHLORIDE: 93 mmol/L — AB (ref 101–111)
CO2: 19 mmol/L — AB (ref 22–32)
Calcium: 8.4 mg/dL — ABNORMAL LOW (ref 8.9–10.3)
Creatinine, Ser: 20.99 mg/dL — ABNORMAL HIGH (ref 0.61–1.24)
GFR calc non Af Amer: 2 mL/min — ABNORMAL LOW (ref >60)
GFR, EST AFRICAN AMERICAN: 2 mL/min — AB (ref >60)
Glucose, Bld: 159 mg/dL — ABNORMAL HIGH (ref 65–99)
POTASSIUM: 5.1 mmol/L (ref 3.5–5.1)
SODIUM: 130 mmol/L — AB (ref 135–145)

## 2016-04-06 LAB — MRSA PCR SCREENING: MRSA BY PCR: NEGATIVE

## 2016-04-06 LAB — LACTIC ACID, PLASMA: Lactic Acid, Venous: 1 mmol/L (ref 0.5–1.9)

## 2016-04-06 LAB — GLUCOSE, CAPILLARY
GLUCOSE-CAPILLARY: 140 mg/dL — AB (ref 65–99)
GLUCOSE-CAPILLARY: 171 mg/dL — AB (ref 65–99)

## 2016-04-06 LAB — CBG MONITORING, ED
GLUCOSE-CAPILLARY: 61 mg/dL — AB (ref 65–99)
Glucose-Capillary: 103 mg/dL — ABNORMAL HIGH (ref 65–99)

## 2016-04-06 MED ORDER — RAMIPRIL 1.25 MG PO CAPS
2.5000 mg | ORAL_CAPSULE | Freq: Every day | ORAL | Status: DC
  Administered 2016-04-06: 2.5 mg via ORAL
  Filled 2016-04-06: qty 2

## 2016-04-06 MED ORDER — SODIUM CHLORIDE 0.9% FLUSH: 3.0000 mL | INTRAVENOUS | Status: DC | PRN

## 2016-04-06 MED ORDER — DEXTROSE 50 % IV SOLN: 50.0000 mL | Freq: Once | INTRAVENOUS | Status: DC

## 2016-04-06 MED ORDER — SODIUM CHLORIDE 0.9 % IV SOLN: 250.0000 mL | INTRAVENOUS | Status: DC | PRN

## 2016-04-06 MED ORDER — SODIUM CHLORIDE 0.9 % IV SOLN: 100.0000 mL | INTRAVENOUS | Status: DC | PRN

## 2016-04-06 MED ORDER — CARVEDILOL 12.5 MG PO TABS: 12.5000 mg | ORAL_TABLET | ORAL | Status: DC

## 2016-04-06 MED ORDER — SEVELAMER CARBONATE 800 MG PO TABS
2400.0000 mg | ORAL_TABLET | Freq: Three times a day (TID) | ORAL | Status: DC
Start: 2016-04-06 — End: 2016-04-06
  Administered 2016-04-06 (×2): 2400 mg via ORAL
  Filled 2016-04-06 (×2): qty 3

## 2016-04-06 MED ORDER — LIDOCAINE HCL (PF) 1 % IJ SOLN: 5.0000 mL | INTRAMUSCULAR | Status: DC | PRN

## 2016-04-06 MED ORDER — INSULIN ASPART 100 UNIT/ML ~~LOC~~ SOLN
0.0000 [IU] | Freq: Three times a day (TID) | SUBCUTANEOUS | Status: DC
  Administered 2016-04-06: 1 [IU] via SUBCUTANEOUS
  Administered 2016-04-06: 2 [IU] via SUBCUTANEOUS

## 2016-04-06 MED ORDER — SEVELAMER CARBONATE 800 MG PO TABS: 800.0000 mg | ORAL_TABLET | ORAL | Status: DC

## 2016-04-06 MED ORDER — DEXTROSE 50 % IV SOLN
1.0000 | Freq: Once | INTRAVENOUS | Status: AC
  Administered 2016-04-06: 50 mL via INTRAVENOUS

## 2016-04-06 MED ORDER — PENTAFLUOROPROP-TETRAFLUOROETH EX AERO
1.0000 "application " | INHALATION_SPRAY | CUTANEOUS | Status: DC | PRN
  Filled 2016-04-06: qty 30

## 2016-04-06 MED ORDER — ASPIRIN EC 325 MG PO TBEC
325.0000 mg | DELAYED_RELEASE_TABLET | Freq: Every day | ORAL | Status: DC
  Administered 2016-04-06: 325 mg via ORAL
  Filled 2016-04-06: qty 1

## 2016-04-06 MED ORDER — CINACALCET HCL 30 MG PO TABS
30.0000 mg | ORAL_TABLET | Freq: Every day | ORAL | Status: DC
  Administered 2016-04-06: 30 mg via ORAL
  Filled 2016-04-06 (×3): qty 1

## 2016-04-06 MED ORDER — INSULIN GLARGINE 100 UNIT/ML ~~LOC~~ SOLN
40.0000 [IU] | Freq: Every day | SUBCUTANEOUS | Status: DC
  Filled 2016-04-06 (×2): qty 0.4

## 2016-04-06 MED ORDER — SODIUM CHLORIDE 0.9% FLUSH
3.0000 mL | Freq: Two times a day (BID) | INTRAVENOUS | Status: DC
  Administered 2016-04-06: 3 mL via INTRAVENOUS

## 2016-04-06 MED ORDER — CYCLOBENZAPRINE HCL 10 MG PO TABS: 10.0000 mg | ORAL_TABLET | Freq: Three times a day (TID) | ORAL | Status: DC | PRN

## 2016-04-06 MED ORDER — HEPARIN SODIUM (PORCINE) 1000 UNIT/ML DIALYSIS
20.0000 [IU]/kg | INTRAMUSCULAR | Status: DC | PRN
  Filled 2016-04-06: qty 3

## 2016-04-06 MED ORDER — DEXTROSE 50 % IV SOLN
INTRAVENOUS | Status: AC
  Filled 2016-04-06: qty 50

## 2016-04-06 MED ORDER — HYDROCODONE-ACETAMINOPHEN 5-325 MG PO TABS: 1.0000 | ORAL_TABLET | Freq: Four times a day (QID) | ORAL | Status: DC | PRN

## 2016-04-06 MED ORDER — LIDOCAINE-PRILOCAINE 2.5-2.5 % EX CREA
1.0000 "application " | TOPICAL_CREAM | CUTANEOUS | Status: DC | PRN
  Filled 2016-04-06: qty 5

## 2016-04-06 NOTE — Care Management Note (Signed)
Case Management Note  Patient Details  Name: Jacob [name] MRN: [id number] Date of Birth: April 12, 1951  Subjective/Objective:                  Pt is from home, lives alone and is ind with ADL's. He is on HD three days a week at [facility] in [place] and was admitted as OBS for HD. He plans to return home with self care. His sister is at the beside. Anticipate DC home with self care in next 24 hrs.   Action/Plan: No CM needs anticipated.   Expected Discharge Date:  04/08/16               Expected Discharge Plan:  Home/Self Care  In-House Referral:  NA  Discharge planning Services  CM Consult  Post Acute Care Choice:  NA Choice offered to:  NA  DME Arranged:    DME Agency:     HH Arranged:    HH Agency:     Status of Service:  Completed, signed off  If discussed at [employer] of Stay Meetings, dates discussed:    Additional Comments:  Sherald Barge, RN 04/06/2016, 11:17 AM

## 2016-04-06 NOTE — H&P (Signed)
History and Physical    Jacob [name] [id number] DOB: Sep 30, 1950 DOA: 04/05/2016  PCP: Purvis Kilts, MD  Patient coming from:  home  Chief Complaint:   sob  HPI: Jacob [name] is a 65 y.o. male with medical history significant of ESRD dialysis on MWF, systolic CHF, CVA, DM, HTN comes in with 2 day of worsening sob.  Pt missed his dialysis on Wednesday, had his routine session on Friday.  Hardly ever misses his sessions.  Denies any fevers.  No cough.  Pt found to have k level of 7.5, cr of 25.  Referred for admission for emergent dialysis needs.  Pt getting dialysis now in the stepdown unit, have spoken to dr Theador Hawthorne at bedside also.  Review of Systems: As per HPI otherwise 10 point review of systems negative.   Past Medical History:  Diagnosis Date  . Anemia in chronic kidney disease(285.21)   . Arthritis   . Chronic systolic heart failure ([place])   . End stage renal disease ([place])    Hemodialysis on Monday, Wednesday, Friday- in [place]  . Essential hypertension, benign   . GERD (gastroesophageal reflux disease)    sometimes- not taking omperazole  . Glaucoma   . History of blood transfusion   . Nonischemic cardiomyopathy ([place])    Hypertension and regular alcohol use, LVEF 40-45%  . Shortness of breath dyspnea    with exertion  . Stroke [facility]) 2016   right shoulder weakness  . Type 2 diabetes mellitus ([place])     Past Surgical History:  Procedure Laterality Date  . AV FISTULA PLACEMENT     Hx: of  . AV FISTULA PLACEMENT Right 05/04/2013   Procedure: ARTERIOVENOUS (AV) FISTULA CREATION- RIGHT ARM;  Surgeon: Conrad Topanga, MD;  Location: [place];  Service: Vascular;  Laterality: Right;  Ultrasound guided  . CATARACT EXTRACTION W/PHACO Left 07/16/2014   Procedure: CATARACT EXTRACTION PHACO AND INTRAOCULAR LENS PLACEMENT (IOC);  Surgeon: [name], MD;  Location: [facility];  Service: Ophthalmology;  Laterality: Left;  CDE:8.86  . CATARACT EXTRACTION W/PHACO Right 07/30/2014   Procedure: CATARACT EXTRACTION PHACO AND INTRAOCULAR LENS PLACEMENT (IOC);  Surgeon: [name], MD;  Location: [facility];  Service: Ophthalmology;  Laterality: Right;  CDE 8.99  . COLONOSCOPY     Hx: of  . FISTULOGRAM N/A 05/04/2013   Procedure: CENTRAL VENOGRAM;  Surgeon: Conrad Poulsbo, MD;  Location: [place];  Service: Vascular;  Laterality: N/A;  . INSERTION OF DIALYSIS CATHETER Right 05/04/2013   Procedure: INSERTION OF DIALYSIS CATHETER;  Surgeon: Conrad Harrodsburg, MD;  Location: [place];  Service: Vascular;  Laterality: Right;  Ultrasound guided  . LIGATION OF ARTERIOVENOUS  FISTULA Left 05/04/2013   Procedure: LIGATION OF LEFT RADIAL CEPHALIC ARTERIOVENOUS  FISTULA;  Surgeon: Conrad Windsor, MD;  Location: [place];  Service: Vascular;  Laterality: Left;  Ultrasound guided  . LUMBAR SPINE SURGERY    . PERIPHERAL VASCULAR CATHETERIZATION N/A 01/24/2015   Procedure: Fistulagram;  Surgeon: Conrad La Porte, MD;  Location: [place] [facility];  Service: Cardiovascular;  Laterality: N/A;  . REVISON OF ARTERIOVENOUS FISTULA Right 01/02/2014   Procedure: REVISON OF ARTERIOVENOUS FISTULA ANASTOMOSIS;  Surgeon: Conrad Carlisle, MD;  Location: [place];  Service: Vascular;  Laterality: Right;  . REVISON OF ARTERIOVENOUS FISTULA Right 01/02/2016   Procedure: REVISION OF RADIOCEPHALIC ARTERIOVENOUS FISTULA  with BOVINE PATCH ANGIOPLASTY RIGHT RADIAL ARTERY;  Surgeon: Mal [name], MD;  Location: [place];  Service:  Vascular;  Laterality: Right;  . SHUNTOGRAM N/A 11/07/2013   Procedure: Fistulogram;  Surgeon: [name], MD;  Location: [facility] C[facility] LAB;  Service: Cardiovascular;  Laterality: N/A;     reports that he has never smoked. He has never used smokeless tobacco. He reports that he drinks alcohol. He reports that he does not use drugs.  Allergies  Allergen Reactions  . Bee Venom Anaphylaxis  . Penicillins Swelling    Swelling of throat and tongue Has patient had a PCN reaction causing immediate rash, facial/tongue/throat  swelling, SOB or lightheadedness with hypotension: No Has patient had a PCN reaction causing severe rash involving mucus membranes or skin necrosis: No Has patient had a PCN reaction that required hospitalization No Has patient had a PCN reaction occurring within the last 10 years: No If all of the above answers are "NO", then may proceed with Cephalosporin use.    Family History  Problem Relation Age of Onset  . Heart attack Brother     58  . Heart disease Brother     before age 27  . Hypertension Brother   . Heart attack Brother     26  . Heart attack Brother     51  . Diabetes Mother   . Hypertension Mother   . Heart disease Mother   . Heart disease Father   . Hypertension Father   . Other Father     amputation  . Hypertension Sister   . Heart disease Sister   . Vision loss Maternal Uncle     Prior to Admission medications   Medication Sig Start Date End Date Taking? Authorizing Provider  acetaminophen (TYLENOL) 500 MG tablet Take 1,000 mg by mouth every 6 (six) hours as needed for mild pain.   Yes Historical Provider, MD  aspirin EC 325 MG tablet Take 325 mg by mouth daily.   Yes Historical Provider, MD  B Complex-C-Folic Acid (DIALYVITE TABLET) TABS Take 1 tablet by mouth every Monday, Wednesday, and Friday.    Yes Historical Provider, MD  carvedilol (COREG) 12.5 MG tablet Take 12.5 mg by mouth every morning. *Not taken on dialysis days, Mondays, Wednesdays, and Fridays   Yes Historical Provider, MD  cinacalcet (SENSIPAR) 30 MG tablet Take 30 mg by mouth daily.   Yes Historical Provider, MD  cyclobenzaprine (FLEXERIL) 10 MG tablet Take 10 mg by mouth 3 (three) times daily as needed for muscle spasms.   Yes Historical Provider, MD  etodolac (LODINE) 400 MG tablet Take 400 mg by mouth 2 (two) times daily as needed for mild pain or moderate pain.   Yes Historical Provider, MD  HYDROcodone-acetaminophen (NORCO/VICODIN) 5-325 MG tablet Take 1 tablet by mouth every 6 (six) hours  as needed for moderate pain. 01/02/16  Yes Samantha J Rhyne, PA-C  LANTUS SOLOSTAR 100 UNIT/ML Solostar Pen Inject 40 Units into the skin at bedtime.  01/15/15  Yes Historical Provider, MD  omeprazole (PRILOSEC) 40 MG capsule Take 40 mg by mouth daily.     Yes Historical Provider, MD  ramipril (ALTACE) 2.5 MG capsule Take 2.5 mg by mouth daily.   Yes Historical Provider, MD  RENVELA 800 MG tablet Take 800-2,400 mg by mouth 3 (three) times daily with meals. Take 3 tablets with meals and 1 tablet with snacks 05/25/13  Yes Historical Provider, MD  zolpidem (AMBIEN) 10 MG tablet Take 10 mg by mouth at bedtime as needed for sleep.  11/09/15  Yes Historical Provider, MD  EPIPEN 2-PAK 0.3  MG/0.3ML SOAJ injection Inject 0.3 mg into the muscle once as needed (For anaphylaxis.).  04/18/13   Historical Provider, MD    Physical Exam: Vitals:   04/05/16 2115 04/05/16 2230 04/05/16 2245 04/05/16 2311  BP:  145/96  148/78  Pulse: 77 75 78 81  Resp: 18   16  Temp:      TempSrc:      SpO2: 100% 99% 98% 97%  Weight:      Height:          Constitutional: NAD, calm, comfortable Vitals:   04/05/16 2115 04/05/16 2230 04/05/16 2245 04/05/16 2311  BP:  145/96  148/78  Pulse: 77 75 78 81  Resp: 18   16  Temp:      TempSrc:      SpO2: 100% 99% 98% 97%  Weight:      Height:       Eyes: PERRL, lids and conjunctivae normal ENMT: Mucous membranes are moist. Posterior pharynx clear of any exudate or lesions.Normal dentition.  Neck: normal, supple, no masses, no thyromegaly Respiratory: clear to auscultation bilaterally, no wheezing, no crackles. Normal respiratory effort. No accessory muscle use.  Cardiovascular: Regular rate and rhythm, no murmurs / rubs / gallops. No extremity edema. 2+ pedal pulses. No carotid bruits.  Abdomen: no tenderness, no masses palpated. No hepatosplenomegaly. Bowel sounds positive.  Musculoskeletal: no clubbing / cyanosis. No joint deformity upper and lower extremities. Good ROM, no  contractures. Normal muscle tone.  Skin: no rashes, lesions, ulcers. No induration Neurologic: CN 2-12 grossly intact. Sensation intact, DTR normal. Strength 5/5 in all 4.  Psychiatric: Normal judgment and insight. Alert and oriented x 3. Normal mood.    Labs on Admission: I have personally reviewed following labs and imaging studies  CBC:  Recent Labs Lab 04/05/16 2112  WBC 7.9  NEUTROABS 4.8  HGB 12.2*  HCT 37.5*  MCV 91.0  PLT [id number]   Basic Metabolic Panel:  Recent Labs Lab 04/05/16 2112 04/05/16 2256  NA 128*  --   K 7.4* 7.0*  CL 90*  --   CO2 16*  --   GLUCOSE 95  --   BUN 102*  --   CREATININE 25.24*  --   CALCIUM 8.5*  --    GFR: Estimated Creatinine Clearance: 4.3 mL/min (by C-G formula based on SCr of 25.24 mg/dL).  Cardiac Enzymes:  Recent Labs Lab 04/05/16 2112  TROPONINI 0.03*   CBG:  Recent Labs Lab 04/05/16 2229  GLUCAP 91   Radiological Exams on Admission: Dg Chest 2 View  Result Date: 04/05/2016 CLINICAL DATA:  65 year old male with shortness of breath for 2 days. Reports normal dialysis session 2 days ago. Also reports recent hip pain, not improved with Lortab. Initial encounter. EXAM: CHEST  2 VIEW COMPARISON:  [facility] portable chest radiographs 01/18/2015 and earlier. FINDINGS: Stable cardiomegaly and mediastinal contours. Stable lung volumes with mild elevation of the right hemidiaphragm. Left subclavian region vascular stent is unchanged. No pneumothorax or pulmonary edema. No pleural effusion or confluent pulmonary opacity. Stable tracheal air column. No acute osseous abnormality identified. IMPRESSION: Stable cardiomegaly.  No acute cardiopulmonary abnormality. Electronically Signed   By: Genevie Ann M.D.   On: 04/05/2016 21:50   Ct Head Wo Contrast  Result Date: 04/05/2016 CLINICAL DATA:  65 year old male with bilateral lower extremity weakness and shortness of breath EXAM: CT HEAD WITHOUT CONTRAST TECHNIQUE: Contiguous  axial images were obtained from the base of the skull through the vertex  without intravenous contrast. COMPARISON:  Head CT dated 01/18/2015 FINDINGS: There is mild age-related atrophy and chronic microvascular ischemic changes. There is no acute intracranial hemorrhage. No mass effect or midline shift noted. There is calcification of the anterior falx. The visualized paranasal sinuses and mastoid air cells are clear. The calvarium is intact. IMPRESSION: No acute intracranial hemorrhage. Mild age-related atrophy and chronic microvascular ischemic disease. If symptoms persist and there are no contraindications, MRI may provide better evaluation if clinically indicated Electronically Signed   By: Anner Crete M.D.   On: 04/05/2016 21:49    Assessment/Plan 65 yo male dialysis patient with hyperkalemia urgent need for dialysis  Principal Problem:   Inadequate dialysis- per nephro team.  Repeat bmp in am.  May be able to go home later today.  Oxygen sats normal.  Active Problems:   Nonischemic cardiomyopathy (HCC)   End-stage renal disease on hemodialysis (HCC)   Essential hypertension, benign   Mixed hyperlipidemia   Hyperkalemia     DVT prophylaxis:   scds Code Status:  Full code Family Communication:  none Disposition Plan:   Per day team Consults called:   Nephrology dr Theador Hawthorne Admission status:  obs   Lathan Gieselman A MD [facility]  If 7PM-7AM, please contact night-coverage [email or web address] Password [email or web address]  04/06/2016, 12:00 AM

## 2016-04-06 NOTE — Procedures (Signed)
   EMERGENT HEMODIALYSIS TREATMENT NOTE:  4 hour low-heparin dialysis completed via right forearm AVF (15g/antegrade). Goal met: 3 liters removed without interruption in ultrafiltration.  All blood was returned and hemostasis was achieved within 15 minutes. Hemodynamically stable throughout HD session.  Serologies rescheduled for 0630 (1 hour and 15 minutes after ending HD).  Report given to Deanne Coffer, RN.  Rockwell Alexandria, RN, CDN

## 2016-04-06 NOTE — Discharge Summary (Signed)
Physician Discharge Summary  BREVAN DIMATTIA [id number] DOB: 1951-06-29 DOA: 04/05/2016  PCP: Purvis Kilts, MD  Admit date: 04/05/2016 Discharge date: 04/06/2016  Recommendations for Outpatient Follow-up:  1. Continue routine hemodialysis   Follow-up Information    Purvis Kilts, MD .   Specialty:  Family Medicine Why:  as needed Contact information: [address] [place] [id number] [phone]          Discharge Diagnoses:  1. Hyperkalemia requiring emergent hemodialysis 2. End-stage renal disease 3. Anion gap metabolic acidosis secondary to uremia 4. Hyponatremia secondary to end-stage renal disease 5. Diabetes mellitus type 2 6. Anemia of chronic kidney disease  Discharge Condition: improved Disposition: home  Diet recommendation: renal & diabetic diet  Filed Weights   04/05/16 2047 04/06/16 0100 04/06/16 0534  Weight: (!) 138.3 kg (305 lb) (!) 139.8 kg (308 lb 3.3 oz) 135.9 kg (299 lb 9.7 oz)    History of present illness:  63 yom with ESRD HD MWF, systolic CHF, CVA, HTN, and DM type 2, presented to the ED with worsening SOB that onset 2 days prior to his arrival. He was admitted for emergent hemodialysis for hyperkalemia.  Hospital Course:  Mr. Whichard underwent emergent hemodialysis without complication and the following morning potassium had returned to normal. His hospitalization was uncomplicated. He was seen by nephrology and cleared for discharge home today. Patient declined to have further dialysis as an inpatient.  1. Hyperkalemia in the context ESRD on HD. Dialysis on MWF, but missed Wednesday session 5/30. Admitted for emergent hemodialysis. Hyperkalemia now resolved. 2. AG metabolic acidosis secondary to uremia. Improve status post hemodialysis. Appears asymptomatic. 3. Hyponatremia. Secondary to end-stage renal disease. Improve status post hemodialysis. 4. DM type 2 with one episode of hypoglycemia. Appears stable. 5. Anemia of  CKD. Stable. 6. Subjective bilateral leg weakness and numbness for 1 week. On exam the patient has excellent strength in bilateral lower extremities and sensation appears grossly normal. He reports history of low back problems. No bowel or bladder difficulty.  Consultants:  Nephrology  Procedures:  None  Antimicrobials:  None   Discharge Instructions  Discharge Instructions    Activity as tolerated - No restrictions    Complete by:  As directed   Diet - low sodium heart healthy    Complete by:  As directed   Diet Carb Modified    Complete by:  As directed   Discharge instructions    Complete by:  As directed   Call your physician or seek immediate medical attention for shortness of breath, swelling, weight gain, pain or worsening of condition.       Medication List    TAKE these medications   acetaminophen 500 MG tablet Commonly known as:  TYLENOL Take 1,000 mg by mouth every 6 (six) hours as needed for mild pain.   aspirin EC 325 MG tablet Take 325 mg by mouth daily.   carvedilol 12.5 MG tablet Commonly known as:  COREG Take 12.5 mg by mouth every morning. *Not taken on dialysis days, Mondays, Wednesdays, and Fridays   cinacalcet 30 MG tablet Commonly known as:  SENSIPAR Take 30 mg by mouth daily.   cyclobenzaprine 10 MG tablet Commonly known as:  FLEXERIL Take 10 mg by mouth 3 (three) times daily as needed for muscle spasms.   DIALYVITE TABLET Tabs Take 1 tablet by mouth every Monday, Wednesday, and Friday.   EPIPEN 2-PAK 0.3 mg/0.3 mL Soaj injection Generic drug:  EPINEPHrine Inject 0.3 mg  into the muscle once as needed (For anaphylaxis.).   etodolac 400 MG tablet Commonly known as:  LODINE Take 400 mg by mouth 2 (two) times daily as needed for mild pain or moderate pain.   HYDROcodone-acetaminophen 5-325 MG tablet Commonly known as:  NORCO/VICODIN Take 1 tablet by mouth every 6 (six) hours as needed for moderate pain.   LANTUS SOLOSTAR 100  UNIT/ML Solostar Pen Generic drug:  Insulin Glargine Inject 40 Units into the skin at bedtime.   omeprazole 40 MG capsule Commonly known as:  PRILOSEC Take 40 mg by mouth daily.   ramipril 2.5 MG capsule Commonly known as:  ALTACE Take 2.5 mg by mouth daily.   RENVELA 800 MG tablet Generic drug:  sevelamer carbonate Take 800-2,400 mg by mouth 3 (three) times daily with meals. Take 3 tablets with meals and 1 tablet with snacks   zolpidem 10 MG tablet Commonly known as:  AMBIEN Take 10 mg by mouth at bedtime as needed for sleep.      Allergies  Allergen Reactions  . Bee Venom Anaphylaxis  . Penicillins Swelling    Swelling of throat and tongue Has patient had a PCN reaction causing immediate rash, facial/tongue/throat swelling, SOB or lightheadedness with hypotension: No Has patient had a PCN reaction causing severe rash involving mucus membranes or skin necrosis: No Has patient had a PCN reaction that required hospitalization No Has patient had a PCN reaction occurring within the last 10 years: No If all of the above answers are "NO", then may proceed with Cephalosporin use.    The results of significant diagnostics from this hospitalization (including imaging, microbiology, ancillary and laboratory) are listed below for reference.    Significant Diagnostic Studies: Dg Chest 2 View  Result Date: 04/05/2016 CLINICAL DATA:  65 year old male with shortness of breath for 2 days. Reports normal dialysis session 2 days ago. Also reports recent hip pain, not improved with Lortab. Initial encounter. EXAM: CHEST  2 VIEW COMPARISON:  [facility] portable chest radiographs 01/18/2015 and earlier. FINDINGS: Stable cardiomegaly and mediastinal contours. Stable lung volumes with mild elevation of the right hemidiaphragm. Left subclavian region vascular stent is unchanged. No pneumothorax or pulmonary edema. No pleural effusion or confluent pulmonary opacity. Stable tracheal air  column. No acute osseous abnormality identified. IMPRESSION: Stable cardiomegaly.  No acute cardiopulmonary abnormality. Electronically Signed   By: Genevie Ann M.D.   On: 04/05/2016 21:50   Ct Head Wo Contrast  Result Date: 04/05/2016 CLINICAL DATA:  65 year old male with bilateral lower extremity weakness and shortness of breath EXAM: CT HEAD WITHOUT CONTRAST TECHNIQUE: Contiguous axial images were obtained from the base of the skull through the vertex without intravenous contrast. COMPARISON:  Head CT dated 01/18/2015 FINDINGS: There is mild age-related atrophy and chronic microvascular ischemic changes. There is no acute intracranial hemorrhage. No mass effect or midline shift noted. There is calcification of the anterior falx. The visualized paranasal sinuses and mastoid air cells are clear. The calvarium is intact. IMPRESSION: No acute intracranial hemorrhage. Mild age-related atrophy and chronic microvascular ischemic disease. If symptoms persist and there are no contraindications, MRI may provide better evaluation if clinically indicated Electronically Signed   By: Anner Crete M.D.   On: 04/05/2016 21:49    Microbiology: Recent Results (from the past 240 hour(s))  MRSA PCR Screening     Status: None   Collection Time: 04/06/16 12:52 AM  Result Value Ref Range Status   MRSA by PCR NEGATIVE NEGATIVE Final  Comment:        The GeneXpert MRSA Assay (FDA approved for NASAL specimens only), is one component of a comprehensive MRSA colonization surveillance program. It is not intended to diagnose MRSA infection nor to guide or monitor treatment for MRSA infections.      Labs: Basic Metabolic Panel:  Recent Labs Lab 04/05/16 2112 04/05/16 2256 04/06/16 0623  NA 128*  --  130*  K 7.4* 7.0* 5.1  CL 90*  --  93*  CO2 16*  --  19*  GLUCOSE 95  --  159*  BUN 102*  --  82*  CREATININE 25.24*  --  20.99*  CALCIUM 8.5*  --  8.4*   Liver Function Tests:  Recent Labs Lab  04/05/16 2335  ALBUMIN 3.5   CBC:  Recent Labs Lab 04/05/16 2112 04/06/16 0623  WBC 7.9 5.8  NEUTROABS 4.8  --   HGB 12.2* 11.6*  HCT 37.5* 35.3*  MCV 91.0 90.1  PLT 309 282   Cardiac Enzymes:  Recent Labs Lab 04/05/16 2112  TROPONINI 0.03*    CBG:  Recent Labs Lab 04/05/16 2229 04/06/16 0007 04/06/16 0042 04/06/16 0741 04/06/16 1122  GLUCAP 91 61* 103* 140* 171*    Principal Problem:   Hyperkalemia Active Problems:   End-stage renal disease on hemodialysis (HCC)   Essential hypertension, benign   Inadequate dialysis   Time coordinating discharge: 25 minutes  Signed:  Murray Hodgkins, MD [facility] 04/06/2016, 3:19 PM

## 2016-04-06 NOTE — ED Notes (Signed)
Pt c/o feeling "warm" all over, cbg checked, 61, Dr Rancour notified, additional orders given,

## 2016-04-06 NOTE — ED Notes (Signed)
Pt given nabs, sprite per request,

## 2016-04-06 NOTE — Progress Notes (Signed)
Jacob [name]  MRN: [id number]  DOB/AGE: Nov 26, 1950 65 y.o.  Primary Care Physician:[name], Jacob Coder, MD  Admit date: 04/05/2016  Chief Complaint:  Chief Complaint  Patient presents with  . Shortness of Breath    S-Pt presented on  04/05/2016 with  Chief Complaint  Patient presents with  . Shortness of Breath  .    Pt today feels better. Pt says " I am ready to go home. I cannot tolerate HD today "    . aspirin EC  325 mg Oral Daily  . [START ON 04/07/2016] carvedilol  12.5 mg Oral Q T,Th,S,Su  . cinacalcet  30 mg Oral Q breakfast  . insulin aspart  0-9 Units Subcutaneous TID WC  . insulin glargine  40 Units Subcutaneous QHS  . ramipril  2.5 mg Oral Daily  . sevelamer carbonate  2,400 mg Oral TID WC  . sevelamer carbonate  800 mg Oral With snacks  . sodium chloride flush  3 mL Intravenous Q12H    Physical Exam: Vital signs in last 24 hours: Temp:  [97.7 F (36.5 C)-98.2 F (36.8 C)] 97.8 F (36.6 C) (09/04 0806) Pulse Rate:  [68-98] 87 (09/04 0600) Resp:  [11-21] 15 (09/04 0600) BP: (103-155)/(49-96) 103/51 (09/04 0600) SpO2:  [96 %-100 %] 98 % (09/04 0600) Weight:  [299 lb 9.7 oz (135.9 kg)-308 lb 3.3 oz (139.8 kg)] 299 lb 9.7 oz (135.9 kg) (09/04 0534) Weight change:  Last BM Date: 04/05/16  Intake/Output from previous day: 09/03 0701 - 09/04 0700 In: -  Out: 3000  Total I/O In: 240 [P.O.:240] Out: -    Physical Exam: General- pt is awake,alert,follows comands Resp- No acute REsp distress, CTA B/L NO Rhonchi CVS- S1S2 regular in rate and rhythm GIT- BS+, soft, NT, ND EXT- NO LE Edema, Cyanosis Access- AVF  Lab Results: CBC  Recent Labs  04/05/16 2112 04/06/16 0623  WBC 7.9 5.8  HGB 12.2* 11.6*  HCT 37.5* 35.3*  PLT 309 282    BMET  Recent Labs  04/05/16 2112 04/05/16 2256 04/06/16 0623  NA 128*  --  130*  K 7.4* 7.0* 5.1  CL 90*  --  93*  CO2 16*  --  19*  GLUCOSE 95  --  159*  BUN 102*  --  82*  CREATININE 25.24*  --  20.99*   CALCIUM 8.5*  --  8.4*    MICRO Recent Results (from the past 240 hour(s))  MRSA PCR Screening     Status: None   Collection Time: 04/06/16 12:52 AM  Result Value Ref Range Status   MRSA by PCR NEGATIVE NEGATIVE Final    Comment:        The GeneXpert MRSA Assay (FDA approved for NASAL specimens only), is one component of a comprehensive MRSA colonization surveillance program. It is not intended to diagnose MRSA infection nor to guide or monitor treatment for MRSA infections.       Lab Results  Component Value Date   CALCIUM 8.4 (L) 04/06/2016   CAION 0.98 (L) 01/24/2015               Impression: 1)Renal  ESRD on HD.                Pt is on Mon/Wed/Fri schedule                 Pt does have hx of no adherence to tx  Pt was dialyzed last night.                Pt not willing to go for HD today                I later called outpt HD center and pt has been scheduled for HD on Tuesday.            2)HTN BP now better.   3)Anemia IN ESRD the goal for HGb is 9-11 NO need of Epo  4)CKD Mineral-Bone Disorder Phosphorus will check Calcium at goal   5)REsp-admitted with shortness of breath Primary MD following  6)Electrolytes  Hyperkalemic    Now better  Hyponatremic    Sodium improved   7)Acid base Co2 not at goal but better     Plan:  Will continue current care I educated pt at length about need to stay away from High K foods.     Jacob [name] S 04/06/2016, 8:25 AM

## 2016-04-06 NOTE — Progress Notes (Addendum)
PROGRESS NOTE  Jacob [name] [id number] DOB: [birth year] DOA: 04/05/2016 PCP: Jacob Kilts, MD  Brief Narrative: 23 yom with ESRD HD MWF, systolic CHF, CVA, HTN, and DM type 2, presented to the ED with worsening SOB that onset 2 days prior to his arrival. He was admitted for emergent hemodialysis for hyperkalemia.  Assessment/Plan: 1. Hyperkalemia in the context ESRD on HD. Dialysis on MWF, but missed Wednesday session 5/30. Admitted for emergent hemodialysis. Hyperkalemia now resolved. 2. AG metabolic acidosis secondary to uremia. Improve status post hemodialysis. Appears asymptomatic. 3. Hyponatremia. Secondary to end-stage renal disease. Improve status post hemodialysis. 4. DM type 2 with one episode of hypoglycemia. Appears stable. 5. Anemia of CKD. Stable. 6. Subjective bilateral leg weakness and numbness for 1 week. Per EDP no focal weakness. usually uses can, h/o herniated disc.   Continue supportive care  F/u nephrology recommendations  Anticipate discharge home in 24 hours.  DVT prophylaxis: SCDs Code Status: Full Family Communication: Sister at bedside Disposition Plan: Discharge home in 24 hours  Jacob Hodgkins, MD  [facility] Direct contact: [phone] --[place] [place]  --[email or web address]; password [email or web address]  7PM-7AM contact night coverage as above 04/06/2016, 9:57 AM  LOS: 0 days   Consultants:  Nephrology  Procedures:  None  Antimicrobials:  None  HPI/Subjective: He is doing well today. Breathing well. No nausea or vomiting. No pain. He reports that he missed his dialysis appointment Wednesday. Reports compliance with diet.  Objective: Vitals:   04/06/16 0530 04/06/16 0534 04/06/16 0600 04/06/16 0806  BP: (!) 114/52  (!) 103/51   Pulse: 89  87   Resp: 17  15   Temp:    97.8 F (36.6 C)  TempSrc:    Oral  SpO2:   98%   Weight:  135.9 kg (299 lb 9.7 oz)    Height:        Intake/Output Summary (Last 24 hours) at 04/06/16 0957 Last  data filed at 04/06/16 0806  Gross per 24 hour  Intake              240 ml  Output             3000 ml  Net            -2760 ml     Filed Weights   04/05/16 2047 04/06/16 0100 04/06/16 0534  Weight: (!) 138.3 kg (305 lb) (!) 139.8 kg (308 lb 3.3 oz) 135.9 kg (299 lb 9.7 oz)    Exam:    Constitutional:  . Appears calm and comfortable Respiratory:  . CTA bilaterally, no w/r/r.  . Respiratory effort normal. No retractions or accessory muscle use Cardiovascular:  . RRR, no m/r/g . No LE extremity edema  . Telemetry SR Psychiatric:  . judgement and insight appear normal . Mental status  I have personally reviewed following labs and imaging studies:  Sodium 130 improved  Potassium 5.1 improved  Cr 20.99 improving, BUN 82 improving  Hgb 11.6  CBG 140  Scheduled Meds: . aspirin EC  325 mg Oral Daily  . [START ON 04/07/2016] carvedilol  12.5 mg Oral Q T,Th,S,Su  . cinacalcet  30 mg Oral Q breakfast  . insulin aspart  0-9 Units Subcutaneous TID WC  . insulin glargine  40 Units Subcutaneous QHS  . ramipril  2.5 mg Oral Daily  . sevelamer carbonate  2,400 mg Oral TID WC  . sevelamer carbonate  800 mg Oral With snacks  . sodium chloride flush  3 mL Intravenous Q12H   Continuous Infusions:   Principal Problem:   Hyperkalemia Active Problems:   End-stage renal disease on hemodialysis (HCC)   Essential hypertension, benign   Inadequate dialysis   LOS: 0 days      By signing my name below, I, Jacob [name], attest that this documentation has been prepared under the direction and in the presence of Jacob Klemann P. Sarajane Jews, MD. Electronically signed: Hilbert [name], Scribe.  04/06/16, 8:55 AM  I personally performed the services described in this documentation. All medical record entries made by the scribe were at my direction. I have reviewed the chart and agree that the record reflects my personal performance and is accurate and complete. Jacob Hodgkins, MD

## 2016-04-06 NOTE — Care Management Obs Status (Signed)
[place] NOTIFICATION   Patient Details  Name: Jacob [name] MRN: [id number] Date of Birth: [birth year]   Medicare Observation Status Notification Given:  Yes    Sherald Barge, RN 04/06/2016, 11:16 AM

## 2016-04-06 NOTE — Progress Notes (Signed)
Pt discharged to home. Instructions including medications and follow-up appointments discussed with patient and all questions answered. PIV removed; no complications. Pt taken out via [place] by NT will all belongings.

## 2016-04-06 NOTE — ED Notes (Signed)
Pt and family updated on plan of care,  

## 2016-04-07 LAB — HEPATITIS B SURFACE ANTIGEN: Hepatitis B Surface Ag: NEGATIVE

## 2016-04-08 DIAGNOSIS — Z23 Encounter for immunization: Secondary | ICD-10-CM | POA: Diagnosis not present

## 2016-04-08 DIAGNOSIS — Z992 Dependence on renal dialysis: Secondary | ICD-10-CM | POA: Diagnosis not present

## 2016-04-08 DIAGNOSIS — D509 Iron deficiency anemia, unspecified: Secondary | ICD-10-CM | POA: Diagnosis not present

## 2016-04-08 DIAGNOSIS — N2581 Secondary hyperparathyroidism of renal origin: Secondary | ICD-10-CM | POA: Diagnosis not present

## 2016-04-08 DIAGNOSIS — N186 End stage renal disease: Secondary | ICD-10-CM | POA: Diagnosis not present

## 2016-04-10 DIAGNOSIS — D509 Iron deficiency anemia, unspecified: Secondary | ICD-10-CM | POA: Diagnosis not present

## 2016-04-10 DIAGNOSIS — Z23 Encounter for immunization: Secondary | ICD-10-CM | POA: Diagnosis not present

## 2016-04-10 DIAGNOSIS — Z992 Dependence on renal dialysis: Secondary | ICD-10-CM | POA: Diagnosis not present

## 2016-04-10 DIAGNOSIS — N2581 Secondary hyperparathyroidism of renal origin: Secondary | ICD-10-CM | POA: Diagnosis not present

## 2016-04-10 DIAGNOSIS — N186 End stage renal disease: Secondary | ICD-10-CM | POA: Diagnosis not present

## 2016-04-12 DIAGNOSIS — N186 End stage renal disease: Secondary | ICD-10-CM | POA: Diagnosis not present

## 2016-04-12 DIAGNOSIS — Z992 Dependence on renal dialysis: Secondary | ICD-10-CM | POA: Diagnosis not present

## 2016-04-12 DIAGNOSIS — D509 Iron deficiency anemia, unspecified: Secondary | ICD-10-CM | POA: Diagnosis not present

## 2016-04-12 DIAGNOSIS — Z23 Encounter for immunization: Secondary | ICD-10-CM | POA: Diagnosis not present

## 2016-04-12 DIAGNOSIS — N2581 Secondary hyperparathyroidism of renal origin: Secondary | ICD-10-CM | POA: Diagnosis not present

## 2016-04-15 DIAGNOSIS — N186 End stage renal disease: Secondary | ICD-10-CM | POA: Diagnosis not present

## 2016-04-15 DIAGNOSIS — Z992 Dependence on renal dialysis: Secondary | ICD-10-CM | POA: Diagnosis not present

## 2016-04-15 DIAGNOSIS — D509 Iron deficiency anemia, unspecified: Secondary | ICD-10-CM | POA: Diagnosis not present

## 2016-04-15 DIAGNOSIS — N2581 Secondary hyperparathyroidism of renal origin: Secondary | ICD-10-CM | POA: Diagnosis not present

## 2016-04-15 DIAGNOSIS — Z23 Encounter for immunization: Secondary | ICD-10-CM | POA: Diagnosis not present

## 2016-04-16 DIAGNOSIS — M5442 Lumbago with sciatica, left side: Secondary | ICD-10-CM | POA: Diagnosis not present

## 2016-04-17 ENCOUNTER — Other Ambulatory Visit: Payer: Self-pay | Admitting: Physician Assistant

## 2016-04-17 DIAGNOSIS — Z992 Dependence on renal dialysis: Secondary | ICD-10-CM | POA: Diagnosis not present

## 2016-04-17 DIAGNOSIS — Z23 Encounter for immunization: Secondary | ICD-10-CM | POA: Diagnosis not present

## 2016-04-17 DIAGNOSIS — N186 End stage renal disease: Secondary | ICD-10-CM | POA: Diagnosis not present

## 2016-04-17 DIAGNOSIS — G8929 Other chronic pain: Secondary | ICD-10-CM

## 2016-04-17 DIAGNOSIS — M5442 Lumbago with sciatica, left side: Principal | ICD-10-CM

## 2016-04-17 DIAGNOSIS — N2581 Secondary hyperparathyroidism of renal origin: Secondary | ICD-10-CM | POA: Diagnosis not present

## 2016-04-17 DIAGNOSIS — D509 Iron deficiency anemia, unspecified: Secondary | ICD-10-CM | POA: Diagnosis not present

## 2016-04-20 DIAGNOSIS — N186 End stage renal disease: Secondary | ICD-10-CM | POA: Diagnosis not present

## 2016-04-20 DIAGNOSIS — Z992 Dependence on renal dialysis: Secondary | ICD-10-CM | POA: Diagnosis not present

## 2016-04-20 DIAGNOSIS — N2581 Secondary hyperparathyroidism of renal origin: Secondary | ICD-10-CM | POA: Diagnosis not present

## 2016-04-20 DIAGNOSIS — D509 Iron deficiency anemia, unspecified: Secondary | ICD-10-CM | POA: Diagnosis not present

## 2016-04-20 DIAGNOSIS — Z23 Encounter for immunization: Secondary | ICD-10-CM | POA: Diagnosis not present

## 2016-04-22 DIAGNOSIS — D509 Iron deficiency anemia, unspecified: Secondary | ICD-10-CM | POA: Diagnosis not present

## 2016-04-22 DIAGNOSIS — N186 End stage renal disease: Secondary | ICD-10-CM | POA: Diagnosis not present

## 2016-04-22 DIAGNOSIS — N2581 Secondary hyperparathyroidism of renal origin: Secondary | ICD-10-CM | POA: Diagnosis not present

## 2016-04-22 DIAGNOSIS — Z992 Dependence on renal dialysis: Secondary | ICD-10-CM | POA: Diagnosis not present

## 2016-04-22 DIAGNOSIS — Z23 Encounter for immunization: Secondary | ICD-10-CM | POA: Diagnosis not present

## 2016-04-24 DIAGNOSIS — Z23 Encounter for immunization: Secondary | ICD-10-CM | POA: Diagnosis not present

## 2016-04-24 DIAGNOSIS — D509 Iron deficiency anemia, unspecified: Secondary | ICD-10-CM | POA: Diagnosis not present

## 2016-04-24 DIAGNOSIS — N2581 Secondary hyperparathyroidism of renal origin: Secondary | ICD-10-CM | POA: Diagnosis not present

## 2016-04-24 DIAGNOSIS — Z992 Dependence on renal dialysis: Secondary | ICD-10-CM | POA: Diagnosis not present

## 2016-04-24 DIAGNOSIS — N186 End stage renal disease: Secondary | ICD-10-CM | POA: Diagnosis not present

## 2016-04-25 ENCOUNTER — Ambulatory Visit
Admission: RE | Admit: 2016-04-25 | Discharge: 2016-04-25 | Disposition: A | Discharge status: Home / Self Care | Payer: Medicare Other | Source: Ambulatory Visit | Attending: Physician Assistant | Admitting: Physician Assistant

## 2016-04-25 DIAGNOSIS — M5442 Lumbago with sciatica, left side: Principal | ICD-10-CM

## 2016-04-25 DIAGNOSIS — M5126 Other intervertebral disc displacement, lumbar region: Secondary | ICD-10-CM | POA: Diagnosis not present

## 2016-04-25 DIAGNOSIS — G8929 Other chronic pain: Secondary | ICD-10-CM

## 2016-04-27 DIAGNOSIS — Z23 Encounter for immunization: Secondary | ICD-10-CM | POA: Diagnosis not present

## 2016-04-27 DIAGNOSIS — E119 Type 2 diabetes mellitus without complications: Secondary | ICD-10-CM | POA: Diagnosis not present

## 2016-04-27 DIAGNOSIS — N186 End stage renal disease: Secondary | ICD-10-CM | POA: Diagnosis not present

## 2016-04-27 DIAGNOSIS — D509 Iron deficiency anemia, unspecified: Secondary | ICD-10-CM | POA: Diagnosis not present

## 2016-04-27 DIAGNOSIS — N2581 Secondary hyperparathyroidism of renal origin: Secondary | ICD-10-CM | POA: Diagnosis not present

## 2016-04-27 DIAGNOSIS — Z992 Dependence on renal dialysis: Secondary | ICD-10-CM | POA: Diagnosis not present

## 2016-04-27 DIAGNOSIS — Z794 Long term (current) use of insulin: Secondary | ICD-10-CM | POA: Diagnosis not present

## 2016-04-29 DIAGNOSIS — N186 End stage renal disease: Secondary | ICD-10-CM | POA: Diagnosis not present

## 2016-04-29 DIAGNOSIS — Z23 Encounter for immunization: Secondary | ICD-10-CM | POA: Diagnosis not present

## 2016-04-29 DIAGNOSIS — D509 Iron deficiency anemia, unspecified: Secondary | ICD-10-CM | POA: Diagnosis not present

## 2016-04-29 DIAGNOSIS — N2581 Secondary hyperparathyroidism of renal origin: Secondary | ICD-10-CM | POA: Diagnosis not present

## 2016-04-29 DIAGNOSIS — Z992 Dependence on renal dialysis: Secondary | ICD-10-CM | POA: Diagnosis not present

## 2016-05-01 DIAGNOSIS — D509 Iron deficiency anemia, unspecified: Secondary | ICD-10-CM | POA: Diagnosis not present

## 2016-05-01 DIAGNOSIS — N2581 Secondary hyperparathyroidism of renal origin: Secondary | ICD-10-CM | POA: Diagnosis not present

## 2016-05-01 DIAGNOSIS — N186 End stage renal disease: Secondary | ICD-10-CM | POA: Diagnosis not present

## 2016-05-01 DIAGNOSIS — Z992 Dependence on renal dialysis: Secondary | ICD-10-CM | POA: Diagnosis not present

## 2016-05-01 DIAGNOSIS — Z23 Encounter for immunization: Secondary | ICD-10-CM | POA: Diagnosis not present

## 2016-05-02 DIAGNOSIS — N186 End stage renal disease: Secondary | ICD-10-CM | POA: Diagnosis not present

## 2016-05-02 DIAGNOSIS — Z992 Dependence on renal dialysis: Secondary | ICD-10-CM | POA: Diagnosis not present

## 2016-05-04 DIAGNOSIS — Z992 Dependence on renal dialysis: Secondary | ICD-10-CM | POA: Diagnosis not present

## 2016-05-04 DIAGNOSIS — D509 Iron deficiency anemia, unspecified: Secondary | ICD-10-CM | POA: Diagnosis not present

## 2016-05-04 DIAGNOSIS — N186 End stage renal disease: Secondary | ICD-10-CM | POA: Diagnosis not present

## 2016-05-04 DIAGNOSIS — D631 Anemia in chronic kidney disease: Secondary | ICD-10-CM | POA: Diagnosis not present

## 2016-05-04 DIAGNOSIS — N2581 Secondary hyperparathyroidism of renal origin: Secondary | ICD-10-CM | POA: Diagnosis not present

## 2016-05-06 DIAGNOSIS — N186 End stage renal disease: Secondary | ICD-10-CM | POA: Diagnosis not present

## 2016-05-06 DIAGNOSIS — Z992 Dependence on renal dialysis: Secondary | ICD-10-CM | POA: Diagnosis not present

## 2016-05-06 DIAGNOSIS — D509 Iron deficiency anemia, unspecified: Secondary | ICD-10-CM | POA: Diagnosis not present

## 2016-05-06 DIAGNOSIS — N2581 Secondary hyperparathyroidism of renal origin: Secondary | ICD-10-CM | POA: Diagnosis not present

## 2016-05-06 DIAGNOSIS — D631 Anemia in chronic kidney disease: Secondary | ICD-10-CM | POA: Diagnosis not present

## 2016-05-07 DIAGNOSIS — M48062 Spinal stenosis, lumbar region with neurogenic claudication: Secondary | ICD-10-CM | POA: Diagnosis not present

## 2016-05-07 DIAGNOSIS — M5442 Lumbago with sciatica, left side: Secondary | ICD-10-CM | POA: Diagnosis not present

## 2016-05-08 DIAGNOSIS — D631 Anemia in chronic kidney disease: Secondary | ICD-10-CM | POA: Diagnosis not present

## 2016-05-08 DIAGNOSIS — N2581 Secondary hyperparathyroidism of renal origin: Secondary | ICD-10-CM | POA: Diagnosis not present

## 2016-05-08 DIAGNOSIS — D509 Iron deficiency anemia, unspecified: Secondary | ICD-10-CM | POA: Diagnosis not present

## 2016-05-08 DIAGNOSIS — N186 End stage renal disease: Secondary | ICD-10-CM | POA: Diagnosis not present

## 2016-05-08 DIAGNOSIS — Z992 Dependence on renal dialysis: Secondary | ICD-10-CM | POA: Diagnosis not present

## 2016-05-11 ENCOUNTER — Other Ambulatory Visit: Payer: Self-pay

## 2016-05-11 DIAGNOSIS — D631 Anemia in chronic kidney disease: Secondary | ICD-10-CM | POA: Diagnosis not present

## 2016-05-11 DIAGNOSIS — N186 End stage renal disease: Secondary | ICD-10-CM | POA: Diagnosis not present

## 2016-05-11 DIAGNOSIS — T82510A Breakdown (mechanical) of surgically created arteriovenous fistula, initial encounter: Secondary | ICD-10-CM

## 2016-05-11 DIAGNOSIS — D509 Iron deficiency anemia, unspecified: Secondary | ICD-10-CM | POA: Diagnosis not present

## 2016-05-11 DIAGNOSIS — N2581 Secondary hyperparathyroidism of renal origin: Secondary | ICD-10-CM | POA: Diagnosis not present

## 2016-05-11 DIAGNOSIS — Z992 Dependence on renal dialysis: Secondary | ICD-10-CM | POA: Diagnosis not present

## 2016-05-12 ENCOUNTER — Other Ambulatory Visit: Payer: Self-pay | Admitting: Orthopedic Surgery

## 2016-05-12 DIAGNOSIS — G8929 Other chronic pain: Secondary | ICD-10-CM

## 2016-05-12 DIAGNOSIS — M5442 Lumbago with sciatica, left side: Principal | ICD-10-CM

## 2016-05-13 DIAGNOSIS — D631 Anemia in chronic kidney disease: Secondary | ICD-10-CM | POA: Diagnosis not present

## 2016-05-13 DIAGNOSIS — D509 Iron deficiency anemia, unspecified: Secondary | ICD-10-CM | POA: Diagnosis not present

## 2016-05-13 DIAGNOSIS — N2581 Secondary hyperparathyroidism of renal origin: Secondary | ICD-10-CM | POA: Diagnosis not present

## 2016-05-13 DIAGNOSIS — N186 End stage renal disease: Secondary | ICD-10-CM | POA: Diagnosis not present

## 2016-05-13 DIAGNOSIS — Z992 Dependence on renal dialysis: Secondary | ICD-10-CM | POA: Diagnosis not present

## 2016-05-15 DIAGNOSIS — D509 Iron deficiency anemia, unspecified: Secondary | ICD-10-CM | POA: Diagnosis not present

## 2016-05-15 DIAGNOSIS — Z992 Dependence on renal dialysis: Secondary | ICD-10-CM | POA: Diagnosis not present

## 2016-05-15 DIAGNOSIS — N2581 Secondary hyperparathyroidism of renal origin: Secondary | ICD-10-CM | POA: Diagnosis not present

## 2016-05-15 DIAGNOSIS — D631 Anemia in chronic kidney disease: Secondary | ICD-10-CM | POA: Diagnosis not present

## 2016-05-15 DIAGNOSIS — N186 End stage renal disease: Secondary | ICD-10-CM | POA: Diagnosis not present

## 2016-05-18 ENCOUNTER — Encounter: Payer: Self-pay | Admitting: Vascular Surgery

## 2016-05-18 DIAGNOSIS — N186 End stage renal disease: Secondary | ICD-10-CM | POA: Diagnosis not present

## 2016-05-18 DIAGNOSIS — D509 Iron deficiency anemia, unspecified: Secondary | ICD-10-CM | POA: Diagnosis not present

## 2016-05-18 DIAGNOSIS — N2581 Secondary hyperparathyroidism of renal origin: Secondary | ICD-10-CM | POA: Diagnosis not present

## 2016-05-18 DIAGNOSIS — D631 Anemia in chronic kidney disease: Secondary | ICD-10-CM | POA: Diagnosis not present

## 2016-05-18 DIAGNOSIS — Z992 Dependence on renal dialysis: Secondary | ICD-10-CM | POA: Diagnosis not present

## 2016-05-19 ENCOUNTER — Ambulatory Visit (HOSPITAL_COMMUNITY)
Admission: RE | Admit: 2016-05-19 | Discharge: 2016-05-19 | Disposition: A | Discharge status: Home / Self Care | Payer: Medicare Other | Source: Ambulatory Visit | Attending: Vascular Surgery | Admitting: Vascular Surgery

## 2016-05-19 DIAGNOSIS — I871 Compression of vein: Secondary | ICD-10-CM | POA: Diagnosis not present

## 2016-05-19 DIAGNOSIS — I82601 Acute embolism and thrombosis of unspecified veins of right upper extremity: Secondary | ICD-10-CM | POA: Insufficient documentation

## 2016-05-19 DIAGNOSIS — T82510A Breakdown (mechanical) of surgically created arteriovenous fistula, initial encounter: Secondary | ICD-10-CM | POA: Diagnosis not present

## 2016-05-19 DIAGNOSIS — I721 Aneurysm of artery of upper extremity: Secondary | ICD-10-CM | POA: Diagnosis not present

## 2016-05-20 DIAGNOSIS — Z992 Dependence on renal dialysis: Secondary | ICD-10-CM | POA: Diagnosis not present

## 2016-05-20 DIAGNOSIS — D631 Anemia in chronic kidney disease: Secondary | ICD-10-CM | POA: Diagnosis not present

## 2016-05-20 DIAGNOSIS — N2581 Secondary hyperparathyroidism of renal origin: Secondary | ICD-10-CM | POA: Diagnosis not present

## 2016-05-20 DIAGNOSIS — D509 Iron deficiency anemia, unspecified: Secondary | ICD-10-CM | POA: Diagnosis not present

## 2016-05-20 DIAGNOSIS — N186 End stage renal disease: Secondary | ICD-10-CM | POA: Diagnosis not present

## 2016-05-21 ENCOUNTER — Ambulatory Visit
Admission: RE | Admit: 2016-05-21 | Discharge: 2016-05-21 | Disposition: A | Discharge status: Home / Self Care | Payer: Medicare Other | Source: Ambulatory Visit | Attending: Orthopedic Surgery | Admitting: Orthopedic Surgery

## 2016-05-21 ENCOUNTER — Ambulatory Visit: Payer: Medicare Other | Admitting: Vascular Surgery

## 2016-05-21 DIAGNOSIS — G8929 Other chronic pain: Secondary | ICD-10-CM

## 2016-05-21 DIAGNOSIS — M5442 Lumbago with sciatica, left side: Principal | ICD-10-CM

## 2016-05-21 DIAGNOSIS — M48061 Spinal stenosis, lumbar region without neurogenic claudication: Secondary | ICD-10-CM | POA: Diagnosis not present

## 2016-05-21 MED ORDER — IOPAMIDOL (ISOVUE-M 200) INJECTION 41%
1.0000 mL | Freq: Once | INTRAMUSCULAR | Status: AC
  Administered 2016-05-21: 1 mL via EPIDURAL

## 2016-05-21 MED ORDER — METHYLPREDNISOLONE ACETATE 40 MG/ML INJ SUSP (RADIOLOG
120.0000 mg | Freq: Once | INTRAMUSCULAR | Status: AC
  Administered 2016-05-21: 120 mg via EPIDURAL

## 2016-05-21 NOTE — Discharge Instructions (Signed)

## 2016-05-22 DIAGNOSIS — N2581 Secondary hyperparathyroidism of renal origin: Secondary | ICD-10-CM | POA: Diagnosis not present

## 2016-05-22 DIAGNOSIS — N186 End stage renal disease: Secondary | ICD-10-CM | POA: Diagnosis not present

## 2016-05-22 DIAGNOSIS — Z992 Dependence on renal dialysis: Secondary | ICD-10-CM | POA: Diagnosis not present

## 2016-05-22 DIAGNOSIS — D509 Iron deficiency anemia, unspecified: Secondary | ICD-10-CM | POA: Diagnosis not present

## 2016-05-22 DIAGNOSIS — D631 Anemia in chronic kidney disease: Secondary | ICD-10-CM | POA: Diagnosis not present

## 2016-05-25 DIAGNOSIS — Z992 Dependence on renal dialysis: Secondary | ICD-10-CM | POA: Diagnosis not present

## 2016-05-25 DIAGNOSIS — N2581 Secondary hyperparathyroidism of renal origin: Secondary | ICD-10-CM | POA: Diagnosis not present

## 2016-05-25 DIAGNOSIS — D631 Anemia in chronic kidney disease: Secondary | ICD-10-CM | POA: Diagnosis not present

## 2016-05-25 DIAGNOSIS — N186 End stage renal disease: Secondary | ICD-10-CM | POA: Diagnosis not present

## 2016-05-25 DIAGNOSIS — D509 Iron deficiency anemia, unspecified: Secondary | ICD-10-CM | POA: Diagnosis not present

## 2016-05-26 ENCOUNTER — Encounter: Payer: Self-pay | Admitting: Vascular Surgery

## 2016-05-27 DIAGNOSIS — N186 End stage renal disease: Secondary | ICD-10-CM | POA: Diagnosis not present

## 2016-05-27 DIAGNOSIS — N2581 Secondary hyperparathyroidism of renal origin: Secondary | ICD-10-CM | POA: Diagnosis not present

## 2016-05-27 DIAGNOSIS — D509 Iron deficiency anemia, unspecified: Secondary | ICD-10-CM | POA: Diagnosis not present

## 2016-05-27 DIAGNOSIS — Z992 Dependence on renal dialysis: Secondary | ICD-10-CM | POA: Diagnosis not present

## 2016-05-27 DIAGNOSIS — D631 Anemia in chronic kidney disease: Secondary | ICD-10-CM | POA: Diagnosis not present

## 2016-05-28 ENCOUNTER — Other Ambulatory Visit: Payer: Self-pay

## 2016-05-28 ENCOUNTER — Encounter: Payer: Self-pay | Admitting: Vascular Surgery

## 2016-05-28 ENCOUNTER — Ambulatory Visit (INDEPENDENT_AMBULATORY_CARE_PROVIDER_SITE_OTHER): Payer: Medicare Other | Admitting: Vascular Surgery

## 2016-05-28 VITALS — BP 194/92 | HR 67 | Temp 98.4°F | Resp 22 | Ht 74.0 in | Wt 308.4 lb

## 2016-05-28 DIAGNOSIS — N186 End stage renal disease: Secondary | ICD-10-CM | POA: Diagnosis not present

## 2016-05-28 DIAGNOSIS — Z992 Dependence on renal dialysis: Secondary | ICD-10-CM | POA: Diagnosis not present

## 2016-05-28 NOTE — Progress Notes (Signed)
Referring Physician: Hinda Lenis  Patient name: Jacob [name] MRN: [id number] DOB: 1951-05-28 Sex: male  REASON FOR CONSULT: poor fistula flow  HPI: Jacob [name] is a 65 y.o. male, has a functioning but poorly functioning right radiocephalic AV fistula. He was noted to have poor flow on dialysis. The fistula has been revised several times in the past.  The fistula was originally placed in 2014. Dr Bridgett Larsson revised the fistula in 2015. Most recent revision was in 01/2016 by Dr. Kellie Simmering. Patch angioplasty was placed on the proximal portion of the fistula. Other medical problems include anemia, congestive heart failure, hypertension, diabetes all of which are currently stable. The patient dialyzes on Monday Wednesday Friday in [place].  Past Medical History:  Diagnosis Date  . Anemia in chronic kidney disease(285.21)   . Arthritis   . Chronic systolic heart failure ([place])   . End stage renal disease ([place])    Hemodialysis on Monday, Wednesday, Friday- in [place]  . Essential hypertension, benign   . GERD (gastroesophageal reflux disease)    sometimes- not taking omperazole  . Glaucoma   . History of blood transfusion   . Nonischemic cardiomyopathy ([place])    Hypertension and regular alcohol use, LVEF 40-45%  . Shortness of breath dyspnea    with exertion  . Stroke [facility]) 2016   right shoulder weakness  . Type 2 diabetes mellitus ([place])    Past Surgical History:  Procedure Laterality Date  . AV FISTULA PLACEMENT     Hx: of  . AV FISTULA PLACEMENT Right 05/04/2013   Procedure: ARTERIOVENOUS (AV) FISTULA CREATION- RIGHT ARM;  Surgeon: Conrad [place], MD;  Location: [place];  Service: Vascular;  Laterality: Right;  Ultrasound guided  . CATARACT EXTRACTION W/PHACO Left 07/16/2014   Procedure: CATARACT EXTRACTION PHACO AND INTRAOCULAR LENS PLACEMENT (IOC);  Surgeon: [name], MD;  [facility];  Service: Ophthalmology;  Laterality: Left;  CDE:8.86  . CATARACT EXTRACTION W/PHACO Right 07/30/2014   Procedure: CATARACT EXTRACTION PHACO AND INTRAOCULAR LENS PLACEMENT (IOC);  Surgeon: [name], MD;  [facility]: [facility];  Service: Ophthalmology;  Laterality: Right;  CDE 8.99  . COLONOSCOPY     Hx: of  . FISTULOGRAM N/A 05/04/2013   Procedure: CENTRAL VENOGRAM;  Surgeon: Conrad Annandale, MD;  Location: [place];  Service: Vascular;  Laterality: N/A;  . INSERTION OF DIALYSIS CATHETER Right 05/04/2013   Procedure: INSERTION OF DIALYSIS CATHETER;  Surgeon: Conrad Samburg, MD;  Location: [place];  Service: Vascular;  Laterality: Right;  Ultrasound guided  . LIGATION OF ARTERIOVENOUS  FISTULA Left 05/04/2013   Procedure: LIGATION OF LEFT RADIAL CEPHALIC ARTERIOVENOUS  FISTULA;  Surgeon: Conrad [place], MD;  Location: [facility];  Service: Vascular;  Laterality: Left;  Ultrasound guided  . LUMBAR SPINE SURGERY    . PERIPHERAL VASCULAR CATHETERIZATION N/A 01/24/2015   Procedure: Fistulagram;  Surgeon: Conrad [place], MD;  Location: [place] [facility];  Service: Cardiovascular;  Laterality: N/A;  . REVISON OF ARTERIOVENOUS FISTULA Right 01/02/2014   Procedure: REVISON OF ARTERIOVENOUS FISTULA ANASTOMOSIS;  Surgeon: Conrad , MD;  Location: [facility];  Service: Vascular;  Laterality: Right;  . REVISON OF ARTERIOVENOUS FISTULA Right 01/02/2016   Procedure: REVISION OF RADIOCEPHALIC ARTERIOVENOUS FISTULA  with BOVINE PATCH ANGIOPLASTY RIGHT RADIAL ARTERY;  Surgeon: Mal [name], MD;  Location: [facility];  Service: Vascular;  Laterality: Right;  . SHUNTOGRAM N/A 11/07/2013   Procedure: Fistulogram;  Surgeon: [name], MD;  Location:  [place] [facility];  Service: Cardiovascular;  Laterality: N/A;    Family History  Problem Relation Age of Onset  . Heart attack Brother     83  . Heart disease Brother     before age 49  . Hypertension Brother   . Heart attack Brother     58  . Heart attack Brother     55  . Diabetes Mother   . Hypertension Mother   . Heart disease Mother   . Heart disease Father   . Hypertension Father   .  Other Father     amputation  . Hypertension Sister   . Heart disease Sister   . Vision loss Maternal Uncle     SOCIAL HISTORY: Social History   Social History  . Marital status: Single    Spouse name: N/A  . Number of children: N/A  . Years of education: N/A   Occupational History  . Not on file.   Social History Main Topics  . Smoking status: Never Smoker  . Smokeless tobacco: Never Used  . Alcohol use Yes     Comment: 1- fifth of gin a wek  . Drug use: No  . Sexual activity: Yes    Birth control/ protection: None   Other Topics Concern  . Not on file   Social History Narrative  . No narrative on file    Allergies  Allergen Reactions  . Bee Venom Anaphylaxis  . Penicillins Swelling    Swelling of throat and tongue Has patient had a PCN reaction causing immediate rash, facial/tongue/throat swelling, SOB or lightheadedness with hypotension: No Has patient had a PCN reaction causing severe rash involving mucus membranes or skin necrosis: No Has patient had a PCN reaction that required hospitalization No Has patient had a PCN reaction occurring within the last 10 years: No If all of the above answers are "NO", then may proceed with Cephalosporin use.    Current Outpatient Prescriptions  Medication Sig Dispense Refill  . acetaminophen (TYLENOL) 500 MG tablet Take 1,000 mg by mouth every 6 (six) hours as needed for mild pain.    [name] aspirin EC 325 MG tablet Take 325 mg by mouth daily.    . B Complex-C-Folic Acid (DIALYVITE TABLET) TABS Take 1 tablet by mouth every Monday, Wednesday, and Friday.     . carvedilol (COREG) 12.5 MG tablet Take 12.5 mg by mouth every morning. *Not taken on dialysis days, Mondays, Wednesdays, and Fridays    . cinacalcet (SENSIPAR) 30 MG tablet Take 30 mg by mouth daily.    . cyclobenzaprine (FLEXERIL) 10 MG tablet Take 10 mg by mouth 3 (three) times daily as needed for muscle spasms.    [name] etodolac (LODINE) 400 MG tablet Take 400 mg by mouth 2  (two) times daily as needed for mild pain or moderate pain.    [name] HYDROcodone-acetaminophen (NORCO/VICODIN) 5-325 MG tablet Take 1 tablet by mouth every 6 (six) hours as needed for moderate pain. 15 tablet 0  . LANTUS SOLOSTAR 100 UNIT/ML Solostar Pen Inject 40 Units into the skin at bedtime.     [name] omeprazole (PRILOSEC) 40 MG capsule Take 40 mg by mouth daily.      . ramipril (ALTACE) 2.5 MG capsule Take 2.5 mg by mouth daily.    [name] RENVELA 800 MG tablet Take 800-2,400 mg by mouth 3 (three) times daily with meals. Take 3 tablets with meals and 1 tablet with snacks    . zolpidem (AMBIEN) 10  MG tablet Take 10 mg by mouth at bedtime as needed for sleep.     [name] EPIPEN 2-PAK 0.3 MG/0.3ML SOAJ injection Inject 0.3 mg into the muscle once as needed (For anaphylaxis.).      No current facility-administered medications for this visit.     ROS:   General:  No weight loss, Fever, chills  Cardiac: No recent episodes of chest pain/pressure, no shortness of breath at rest.  + shortness of breath with exertion.  Denies history of atrial fibrillation or irregular heartbeat  Pulmonary: No home oxygen, no productive cough, no hemoptysis,  No asthma or wheezing    Physical Examination  Vitals:   05/28/16 1601 05/28/16 1603  BP: (!) 190/89 (!) 194/92  Pulse: 67   Resp: (!) 22   Temp: 98.4 F (36.9 C)   TempSrc: Oral   SpO2: 96%   Weight: (!) 308 lb 6.4 oz (139.9 kg)   Height: 6\' 2"  (1.88 m)     Body mass index is 39.6 kg/m.  General:  Alert and oriented, no acute distress Extremity Pulses:  Audible bruit with no palpable thrill right forearm AV fistula Musculoskeletal: No deformity or edema  Neurologic: Upper and lower extremity motor 5/5 and symmetric  DATA:  Duplex ultrasound shows narrowing of AV fistula in the distal forearm.  ASSESSMENT:  Poorly functioning right radiocephalic AV fistula.   PLAN:  The patient will be scheduled for a right arm fistulogram with possible intervention  next week by Dr. Bridgett Larsson.  Risks benefits possible palpitations and procedure details were discussed with the patient today.   [name], MD [facility] Office: [phone] Pager: [phone]

## 2016-05-29 DIAGNOSIS — N186 End stage renal disease: Secondary | ICD-10-CM | POA: Diagnosis not present

## 2016-05-29 DIAGNOSIS — D631 Anemia in chronic kidney disease: Secondary | ICD-10-CM | POA: Diagnosis not present

## 2016-05-29 DIAGNOSIS — D509 Iron deficiency anemia, unspecified: Secondary | ICD-10-CM | POA: Diagnosis not present

## 2016-05-29 DIAGNOSIS — N2581 Secondary hyperparathyroidism of renal origin: Secondary | ICD-10-CM | POA: Diagnosis not present

## 2016-05-29 DIAGNOSIS — Z992 Dependence on renal dialysis: Secondary | ICD-10-CM | POA: Diagnosis not present

## 2016-06-01 DIAGNOSIS — Z992 Dependence on renal dialysis: Secondary | ICD-10-CM | POA: Diagnosis not present

## 2016-06-01 DIAGNOSIS — N186 End stage renal disease: Secondary | ICD-10-CM | POA: Diagnosis not present

## 2016-06-01 DIAGNOSIS — D509 Iron deficiency anemia, unspecified: Secondary | ICD-10-CM | POA: Diagnosis not present

## 2016-06-01 DIAGNOSIS — D631 Anemia in chronic kidney disease: Secondary | ICD-10-CM | POA: Diagnosis not present

## 2016-06-01 DIAGNOSIS — N2581 Secondary hyperparathyroidism of renal origin: Secondary | ICD-10-CM | POA: Diagnosis not present

## 2016-06-02 DIAGNOSIS — N186 End stage renal disease: Secondary | ICD-10-CM | POA: Diagnosis not present

## 2016-06-02 DIAGNOSIS — M5442 Lumbago with sciatica, left side: Secondary | ICD-10-CM | POA: Diagnosis not present

## 2016-06-02 DIAGNOSIS — Z992 Dependence on renal dialysis: Secondary | ICD-10-CM | POA: Diagnosis not present

## 2016-06-02 DIAGNOSIS — M48062 Spinal stenosis, lumbar region with neurogenic claudication: Secondary | ICD-10-CM | POA: Diagnosis not present

## 2016-06-03 DIAGNOSIS — N2581 Secondary hyperparathyroidism of renal origin: Secondary | ICD-10-CM | POA: Diagnosis not present

## 2016-06-03 DIAGNOSIS — D509 Iron deficiency anemia, unspecified: Secondary | ICD-10-CM | POA: Diagnosis not present

## 2016-06-03 DIAGNOSIS — N186 End stage renal disease: Secondary | ICD-10-CM | POA: Diagnosis not present

## 2016-06-03 DIAGNOSIS — Z992 Dependence on renal dialysis: Secondary | ICD-10-CM | POA: Diagnosis not present

## 2016-06-03 DIAGNOSIS — D631 Anemia in chronic kidney disease: Secondary | ICD-10-CM | POA: Diagnosis not present

## 2016-06-04 ENCOUNTER — Encounter (HOSPITAL_COMMUNITY): Payer: Self-pay | Admitting: Vascular Surgery

## 2016-06-04 ENCOUNTER — Ambulatory Visit (HOSPITAL_COMMUNITY)
Admission: RE | Admit: 2016-06-04 | Discharge: 2016-06-04 | Disposition: A | Discharge status: Home / Self Care | Payer: Medicare Other | Source: Ambulatory Visit | Attending: Vascular Surgery | Admitting: Vascular Surgery

## 2016-06-04 ENCOUNTER — Encounter (HOSPITAL_COMMUNITY)
Admission: RE | Disposition: A | Discharge status: Home / Self Care | Payer: Self-pay | Source: Ambulatory Visit | Attending: Vascular Surgery

## 2016-06-04 DIAGNOSIS — E1122 Type 2 diabetes mellitus with diabetic chronic kidney disease: Secondary | ICD-10-CM | POA: Insufficient documentation

## 2016-06-04 DIAGNOSIS — K219 Gastro-esophageal reflux disease without esophagitis: Secondary | ICD-10-CM | POA: Insufficient documentation

## 2016-06-04 DIAGNOSIS — N186 End stage renal disease: Secondary | ICD-10-CM | POA: Diagnosis not present

## 2016-06-04 DIAGNOSIS — Z7982 Long term (current) use of aspirin: Secondary | ICD-10-CM | POA: Insufficient documentation

## 2016-06-04 DIAGNOSIS — I428 Other cardiomyopathies: Secondary | ICD-10-CM | POA: Diagnosis not present

## 2016-06-04 DIAGNOSIS — Z8673 Personal history of transient ischemic attack (TIA), and cerebral infarction without residual deficits: Secondary | ICD-10-CM | POA: Diagnosis not present

## 2016-06-04 DIAGNOSIS — I5022 Chronic systolic (congestive) heart failure: Secondary | ICD-10-CM | POA: Diagnosis not present

## 2016-06-04 DIAGNOSIS — I132 Hypertensive heart and chronic kidney disease with heart failure and with stage 5 chronic kidney disease, or end stage renal disease: Secondary | ICD-10-CM | POA: Insufficient documentation

## 2016-06-04 DIAGNOSIS — Z794 Long term (current) use of insulin: Secondary | ICD-10-CM | POA: Diagnosis not present

## 2016-06-04 DIAGNOSIS — D631 Anemia in chronic kidney disease: Secondary | ICD-10-CM | POA: Insufficient documentation

## 2016-06-04 DIAGNOSIS — Y832 Surgical operation with anastomosis, bypass or graft as the cause of abnormal reaction of the patient, or of later complication, without mention of misadventure at the time of the procedure: Secondary | ICD-10-CM | POA: Diagnosis not present

## 2016-06-04 DIAGNOSIS — I708 Atherosclerosis of other arteries: Secondary | ICD-10-CM | POA: Diagnosis not present

## 2016-06-04 DIAGNOSIS — T82510A Breakdown (mechanical) of surgically created arteriovenous fistula, initial encounter: Secondary | ICD-10-CM | POA: Diagnosis not present

## 2016-06-04 DIAGNOSIS — Z79899 Other long term (current) drug therapy: Secondary | ICD-10-CM | POA: Insufficient documentation

## 2016-06-04 DIAGNOSIS — Z992 Dependence on renal dialysis: Secondary | ICD-10-CM | POA: Insufficient documentation

## 2016-06-04 DIAGNOSIS — T82898A Other specified complication of vascular prosthetic devices, implants and grafts, initial encounter: Secondary | ICD-10-CM | POA: Diagnosis not present

## 2016-06-04 HISTORY — PX: PERIPHERAL VASCULAR CATHETERIZATION: SHX172C

## 2016-06-04 LAB — POCT I-STAT, CHEM 8
BUN: 42 mg/dL — AB (ref 6–20)
CALCIUM ION: 1.06 mmol/L — AB (ref 1.15–1.40)
CREATININE: 13.3 mg/dL — AB (ref 0.61–1.24)
Chloride: 100 mmol/L — ABNORMAL LOW (ref 101–111)
GLUCOSE: 156 mg/dL — AB (ref 65–99)
HCT: 34 % — ABNORMAL LOW (ref 39.0–52.0)
HEMOGLOBIN: 11.6 g/dL — AB (ref 13.0–17.0)
Potassium: 4.4 mmol/L (ref 3.5–5.1)
Sodium: 138 mmol/L (ref 135–145)
TCO2: 28 mmol/L (ref 0–100)

## 2016-06-04 LAB — GLUCOSE, CAPILLARY: Glucose-Capillary: 180 mg/dL — ABNORMAL HIGH (ref 65–99)

## 2016-06-04 SURGERY — A/V SHUNTOGRAM/FISTULAGRAM
Laterality: Right

## 2016-06-04 MED ORDER — SODIUM CHLORIDE 0.9% FLUSH: 3.0000 mL | INTRAVENOUS | Status: DC | PRN

## 2016-06-04 MED ORDER — HYDRALAZINE HCL 20 MG/ML IJ SOLN
INTRAMUSCULAR | Status: DC | PRN
  Administered 2016-06-04: 10 mg via INTRAVENOUS

## 2016-06-04 MED ORDER — HEPARIN (PORCINE) IN NACL 2-0.9 UNIT/ML-% IJ SOLN
INTRAMUSCULAR | Status: DC | PRN
  Administered 2016-06-04: 500 mL

## 2016-06-04 MED ORDER — HYDRALAZINE HCL 20 MG/ML IJ SOLN
INTRAMUSCULAR | Status: AC
  Filled 2016-06-04: qty 1

## 2016-06-04 MED ORDER — IODIXANOL 320 MG/ML IV SOLN
INTRAVENOUS | Status: DC | PRN
  Administered 2016-06-04: 45 mL via INTRAVENOUS

## 2016-06-04 MED ORDER — ACETAMINOPHEN 325 MG PO TABS: 650.0000 mg | ORAL_TABLET | ORAL | Status: DC | PRN

## 2016-06-04 MED ORDER — SODIUM CHLORIDE 0.9 % IV SOLN: 250.0000 mL | INTRAVENOUS | Status: DC | PRN

## 2016-06-04 MED ORDER — LIDOCAINE HCL (PF) 1 % IJ SOLN
INTRAMUSCULAR | Status: DC | PRN
  Administered 2016-06-04: 5 mL

## 2016-06-04 MED ORDER — SODIUM CHLORIDE 0.9% FLUSH: 3.0000 mL | Freq: Two times a day (BID) | INTRAVENOUS | Status: DC

## 2016-06-04 MED ORDER — LIDOCAINE HCL (PF) 1 % IJ SOLN
INTRAMUSCULAR | Status: AC
  Filled 2016-06-04: qty 30

## 2016-06-04 SURGICAL SUPPLY — 10 items
BAG SNAP BAND KOVER 36X36 (MISCELLANEOUS) ×2 IMPLANT
COVER DOME SNAP 22 D (MISCELLANEOUS) ×2 IMPLANT
COVER PRB 48X5XTLSCP FOLD TPE (BAG) ×1 IMPLANT
COVER PROBE 5X48 (BAG) ×2
KIT MICROINTRODUCER STIFF 5F (SHEATH) ×1 IMPLANT
PROTECTION STATION PRESSURIZED (MISCELLANEOUS) ×2
STATION PROTECTION PRESSURIZED (MISCELLANEOUS) ×1 IMPLANT
STOPCOCK MORSE 400PSI 3WAY (MISCELLANEOUS) ×2 IMPLANT
TRAY PV CATH (CUSTOM PROCEDURE TRAY) ×2 IMPLANT
TUBING CIL FLEX 10 FLL-RA (TUBING) ×2 IMPLANT

## 2016-06-04 NOTE — H&P (View-Only) (Signed)
Referring Physician: Hinda Lenis  Patient name: Jacob [name] MRN: [id number] DOB: 02/13/1951 Sex: male  REASON FOR CONSULT: poor fistula flow  HPI: Jacob [name] is a 65 y.o. male, has a functioning but poorly functioning right radiocephalic AV fistula. He was noted to have poor flow on dialysis. The fistula has been revised several times in the past.  The fistula was originally placed in 2014. Dr Bridgett Larsson revised the fistula in 2015. Most recent revision was in 01/2016 by Dr. Kellie Simmering. Patch angioplasty was placed on the proximal portion of the fistula. Other medical problems include anemia, congestive heart failure, hypertension, diabetes all of which are currently stable. The patient dialyzes on Monday Wednesday Friday in [place].  Past Medical History:  Diagnosis Date  . Anemia in chronic kidney disease(285.21)   . Arthritis   . Chronic systolic heart failure ([place])   . End stage renal disease ([place])    Hemodialysis on Monday, Wednesday, Friday- in [place]  . Essential hypertension, benign   . GERD (gastroesophageal reflux disease)    sometimes- not taking omperazole  . Glaucoma   . History of blood transfusion   . Nonischemic cardiomyopathy ([place])    Hypertension and regular alcohol use, LVEF 40-45%  . Shortness of breath dyspnea    with exertion  . Stroke [facility]) 2016   right shoulder weakness  . Type 2 diabetes mellitus ([place])    Past Surgical History:  Procedure Laterality Date  . AV FISTULA PLACEMENT     Hx: of  . AV FISTULA PLACEMENT Right 05/04/2013   Procedure: ARTERIOVENOUS (AV) FISTULA CREATION- RIGHT ARM;  Surgeon: Conrad [place], MD;  Location: [place];  Service: Vascular;  Laterality: Right;  Ultrasound guided  . CATARACT EXTRACTION W/PHACO Left 07/16/2014   Procedure: CATARACT EXTRACTION PHACO AND INTRAOCULAR LENS PLACEMENT (IOC);  Surgeon: [name], MD;  [facility];  Service: Ophthalmology;  Laterality: Left;  CDE:8.86  . CATARACT EXTRACTION W/PHACO Right 07/30/2014   Procedure: CATARACT EXTRACTION PHACO AND INTRAOCULAR LENS PLACEMENT (IOC);  Surgeon: [name], MD;  [facility];  Service: Ophthalmology;  Laterality: Right;  CDE 8.99  . COLONOSCOPY     Hx: of  . FISTULOGRAM N/A 05/04/2013   Procedure: CENTRAL VENOGRAM;  Surgeon: Conrad [place], MD;  Location: [facility];  Service: Vascular;  Laterality: N/A;  . INSERTION OF DIALYSIS CATHETER Right 05/04/2013   Procedure: INSERTION OF DIALYSIS CATHETER;  Surgeon: Conrad [place], MD;  Location: [place];  Service: Vascular;  Laterality: Right;  Ultrasound guided  . LIGATION OF ARTERIOVENOUS  FISTULA Left 05/04/2013   Procedure: LIGATION OF LEFT RADIAL CEPHALIC ARTERIOVENOUS  FISTULA;  Surgeon: Conrad [place], MD;  Location: [place];  Service: Vascular;  Laterality: Left;  Ultrasound guided  . LUMBAR SPINE SURGERY    . PERIPHERAL VASCULAR CATHETERIZATION N/A 01/24/2015   Procedure: Fistulagram;  Surgeon: Conrad Pascola, MD;  Location: [place] [facility];  Service: Cardiovascular;  Laterality: N/A;  . REVISON OF ARTERIOVENOUS FISTULA Right 01/02/2014   Procedure: REVISON OF ARTERIOVENOUS FISTULA ANASTOMOSIS;  Surgeon: Conrad , MD;  Location: [facility];  Service: Vascular;  Laterality: Right;  . REVISON OF ARTERIOVENOUS FISTULA Right 01/02/2016   Procedure: REVISION OF RADIOCEPHALIC ARTERIOVENOUS FISTULA  with BOVINE PATCH ANGIOPLASTY RIGHT RADIAL ARTERY;  Surgeon: Mal [name], MD;  Location: [facility];  Service: Vascular;  Laterality: Right;  . SHUNTOGRAM N/A 11/07/2013   Procedure: Fistulogram;  Surgeon: [name], MD;  Location:  [facility];  Service: Cardiovascular;  Laterality: N/A;    Family History  Problem Relation Age of Onset  . Heart attack Brother     7  . Heart disease Brother     before age 78  . Hypertension Brother   . Heart attack Brother     69  . Heart attack Brother     49  . Diabetes Mother   . Hypertension Mother   . Heart disease Mother   . Heart disease Father   . Hypertension Father   .  Other Father     amputation  . Hypertension Sister   . Heart disease Sister   . Vision loss Maternal Uncle     SOCIAL HISTORY: Social History   Social History  . Marital status: Single    Spouse name: N/A  . Number of children: N/A  . Years of education: N/A   Occupational History  . Not on file.   Social History Main Topics  . Smoking status: Never Smoker  . Smokeless tobacco: Never Used  . Alcohol use Yes     Comment: 1- fifth of gin a wek  . Drug use: No  . Sexual activity: Yes    Birth control/ protection: None   Other Topics Concern  . Not on file   Social History Narrative  . No narrative on file    Allergies  Allergen Reactions  . Bee Venom Anaphylaxis  . Penicillins Swelling    Swelling of throat and tongue Has patient had a PCN reaction causing immediate rash, facial/tongue/throat swelling, SOB or lightheadedness with hypotension: No Has patient had a PCN reaction causing severe rash involving mucus membranes or skin necrosis: No Has patient had a PCN reaction that required hospitalization No Has patient had a PCN reaction occurring within the last 10 years: No If all of the above answers are "NO", then may proceed with Cephalosporin use.    Current Outpatient Prescriptions  Medication Sig Dispense Refill  . acetaminophen (TYLENOL) 500 MG tablet Take 1,000 mg by mouth every 6 (six) hours as needed for mild pain.    [name] aspirin EC 325 MG tablet Take 325 mg by mouth daily.    . B Complex-C-Folic Acid (DIALYVITE TABLET) TABS Take 1 tablet by mouth every Monday, Wednesday, and Friday.     . carvedilol (COREG) 12.5 MG tablet Take 12.5 mg by mouth every morning. *Not taken on dialysis days, Mondays, Wednesdays, and Fridays    . cinacalcet (SENSIPAR) 30 MG tablet Take 30 mg by mouth daily.    . cyclobenzaprine (FLEXERIL) 10 MG tablet Take 10 mg by mouth 3 (three) times daily as needed for muscle spasms.    [name] etodolac (LODINE) 400 MG tablet Take 400 mg by mouth 2  (two) times daily as needed for mild pain or moderate pain.    [name] HYDROcodone-acetaminophen (NORCO/VICODIN) 5-325 MG tablet Take 1 tablet by mouth every 6 (six) hours as needed for moderate pain. 15 tablet 0  . LANTUS SOLOSTAR 100 UNIT/ML Solostar Pen Inject 40 Units into the skin at bedtime.     [name] omeprazole (PRILOSEC) 40 MG capsule Take 40 mg by mouth daily.      . ramipril (ALTACE) 2.5 MG capsule Take 2.5 mg by mouth daily.    [name] RENVELA 800 MG tablet Take 800-2,400 mg by mouth 3 (three) times daily with meals. Take 3 tablets with meals and 1 tablet with snacks    . zolpidem (AMBIEN) 10  MG tablet Take 10 mg by mouth at bedtime as needed for sleep.     [name] EPIPEN 2-PAK 0.3 MG/0.3ML SOAJ injection Inject 0.3 mg into the muscle once as needed (For anaphylaxis.).      No current facility-administered medications for this visit.     ROS:   General:  No weight loss, Fever, chills  Cardiac: No recent episodes of chest pain/pressure, no shortness of breath at rest.  + shortness of breath with exertion.  Denies history of atrial fibrillation or irregular heartbeat  Pulmonary: No home oxygen, no productive cough, no hemoptysis,  No asthma or wheezing    Physical Examination  Vitals:   05/28/16 1601 05/28/16 1603  BP: (!) 190/89 (!) 194/92  Pulse: 67   Resp: (!) 22   Temp: 98.4 F (36.9 C)   TempSrc: Oral   SpO2: 96%   Weight: (!) 308 lb 6.4 oz (139.9 kg)   Height: 6\' 2"  (1.88 m)     Body mass index is 39.6 kg/m.  General:  Alert and oriented, no acute distress Extremity Pulses:  Audible bruit with no palpable thrill right forearm AV fistula Musculoskeletal: No deformity or edema  Neurologic: Upper and lower extremity motor 5/5 and symmetric  DATA:  Duplex ultrasound shows narrowing of AV fistula in the distal forearm.  ASSESSMENT:  Poorly functioning right radiocephalic AV fistula.   PLAN:  The patient will be scheduled for a right arm fistulogram with possible intervention  next week by Dr. Bridgett Larsson.  Risks benefits possible palpitations and procedure details were discussed with the patient today.   [name], MD [facility] Office: [phone] Pager: [phone]

## 2016-06-04 NOTE — Discharge Instructions (Signed)
Fistulogram, Care After °Refer to this sheet in the next few weeks. These instructions provide you with information on caring for yourself after your procedure. Your health care provider may also give you more specific instructions. Your treatment has been planned according to current medical practices, but problems sometimes occur. Call your health care provider if you have any problems or questions after your procedure. °WHAT TO EXPECT AFTER THE PROCEDURE °After your procedure, it is typical to have the following: °· A small amount of discomfort in the area where the catheters were placed. °· A small amount of bruising around the fistula. °· Sleepiness and fatigue. °HOME CARE INSTRUCTIONS °· Rest at home for the day following your procedure. °· Do not drive or operate heavy machinery while taking pain medicine. °· Take medicines only as directed by your health care provider. °· Do not take baths, swim, or use a hot tub until your health care provider approves. You may shower 24 hours after the procedure or as directed by your health care provider. °· There are many different ways to close and cover an incision, including stitches, skin glue, and adhesive strips. Follow your health care provider's instructions on: °¨ Incision care. °¨ Bandage (dressing) changes and removal. °¨ Incision closure removal. °· Monitor your dialysis fistula carefully. °SEEK MEDICAL CARE IF: °· You have drainage, redness, swelling, or pain at your catheter site. °· You have a fever. °· You have chills. °SEEK IMMEDIATE MEDICAL CARE IF: °· You feel weak. °· You have trouble balancing. °· You have trouble moving your arms or legs. °· You have problems with your speech or vision. °· You can no longer feel a vibration or buzz when you put your fingers over your dialysis fistula. °· The limb that was used for the procedure: °¨ Swells. °¨ Is painful. °¨ Is cold. °¨ Is discolored, such as blue or pale white. °  °This information is not intended  to replace advice given to you by your health care provider. Make sure you discuss any questions you have with your health care provider. °  °Document Released: 12/04/2013 Document Reviewed: 12/04/2013 °Elsevier Interactive Patient Education ©2016 [employer] Inc[employer] ° °

## 2016-06-04 NOTE — Op Note (Signed)
    OPERATIVE NOTE   PROCEDURE: 1. right radiocephalic arteriovenous fistula cannulation under ultrasound guidance 2. right arm fistulogram  PRE-OPERATIVE DIAGNOSIS: Malfunctioning right radiocephalic arteriovenous arteriovenous fistula   POST-OPERATIVE DIAGNOSIS: same as above   SURGEON: [name], MD  ANESTHESIA: local  ESTIMATED BLOOD LOSS: 5 cc  FINDING(S): 1. Patent fistula with aneurysmal forearm segment. 2. Distal tapering of anastomotic segment 3. No antegrade radial flow visualized 4. Calcified ulnar artery 5. Patent central venous structures  SPECIMEN(S):  None  CONTRAST: 45 cc  INDICATIONS: Jacob [name] is a 65 y.o. male who  presents with malfunctioning right radiocephalic arteriovenous fistula which has undergone multiple revisions of the anastomosis.  The patient is scheduled for right arm fistulogram.  The patient is aware the risks include but are not limited to: bleeding, infection, thrombosis of the cannulated access, and possible anaphylactic reaction to the contrast.  The patient is aware of the risks of the procedure and elects to proceed forward.  DESCRIPTION: After full informed written consent was obtained, the patient was brought back to the angiography suite and placed supine upon the angiography table.  The patient was connected to monitoring equipment.  The right forearm was prepped and draped in the standard fashion for a right arm fistulogram.  Under ultrasound guidance, the right radiocephalic arteriovenous fistula was cannulated with a micropuncture needle.  The microwire was advanced into the fistula and the needle was exchanged for the a microsheath, which was lodged 2 cm into the access.  The wire was removed and the sheath was connected to the IV extension tubing.  Hand injections were completed to image the access from the antecubitum up to the level of axilla.  The central venous structures were also imaged by hand injections.  Based on the  images, this patient will need: no immediate interventions.  A 4-0 Monocryl purse-string suture was sewn around the sheath.  The sheath was removed while tying down the suture.  A sterile bandage was applied to the puncture site.  I explicitedly queried the patient to determine if he had any steal symptoms or any signs of tissue loss in the right hand.  He denies all such.  In the event, he develops any steal or ischemic sx or wounds in the right hand, I proceed with fistula ligation given the compromised radial artery flow and calcified ulnar artery.   COMPLICATIONS: none  CONDITION: stable  [name], MD [facility] Office: [phone] Pager: [phone]  06/04/2016 9:03 AM

## 2016-06-04 NOTE — Interval H&P Note (Signed)
History and Physical Interval Note:  06/04/2016 7:13 AM  Jacob [name]  has presented today for surgery, with the diagnosis of decreased flow through fistula  The various methods of treatment have been discussed with the patient and family. After consideration of risks, benefits and other options for treatment, the patient has consented to  Procedure(s): A/V Shuntogram/Fistulagram (N/A) as a surgical intervention .  The patient's history has been reviewed, patient examined, no change in status, stable for surgery.  I have reviewed the patient's chart and labs.  Questions were answered to the patient's satisfaction.     [name]

## 2016-06-05 ENCOUNTER — Telehealth: Payer: Self-pay | Admitting: Vascular Surgery

## 2016-06-05 DIAGNOSIS — Z992 Dependence on renal dialysis: Secondary | ICD-10-CM | POA: Diagnosis not present

## 2016-06-05 DIAGNOSIS — N186 End stage renal disease: Secondary | ICD-10-CM | POA: Diagnosis not present

## 2016-06-05 DIAGNOSIS — N2581 Secondary hyperparathyroidism of renal origin: Secondary | ICD-10-CM | POA: Diagnosis not present

## 2016-06-05 DIAGNOSIS — D509 Iron deficiency anemia, unspecified: Secondary | ICD-10-CM | POA: Diagnosis not present

## 2016-06-05 DIAGNOSIS — D631 Anemia in chronic kidney disease: Secondary | ICD-10-CM | POA: Diagnosis not present

## 2016-06-05 NOTE — Telephone Encounter (Signed)
LVM on home #, mailed letter for appt 06/10/16

## 2016-06-05 NOTE — Telephone Encounter (Signed)
-----   Message from Mena Goes, RN sent at 06/04/2016  5:14 PM EDT ----- Regarding: Schedule   ----- Message ----- From: Conrad [place], MD Sent: 06/04/2016   9:07 AM To: [address]  [name] [id number] 16-Aug-1950   PROCEDURE: right radiocephalic arteriovenous fistula cannulation under ultrasound guidance right arm fistulogram  Follow-up: 2-4 weeks with Dr. Oneida Alar

## 2016-06-08 DIAGNOSIS — D509 Iron deficiency anemia, unspecified: Secondary | ICD-10-CM | POA: Diagnosis not present

## 2016-06-08 DIAGNOSIS — D631 Anemia in chronic kidney disease: Secondary | ICD-10-CM | POA: Diagnosis not present

## 2016-06-08 DIAGNOSIS — N186 End stage renal disease: Secondary | ICD-10-CM | POA: Diagnosis not present

## 2016-06-08 DIAGNOSIS — N2581 Secondary hyperparathyroidism of renal origin: Secondary | ICD-10-CM | POA: Diagnosis not present

## 2016-06-08 DIAGNOSIS — Z992 Dependence on renal dialysis: Secondary | ICD-10-CM | POA: Diagnosis not present

## 2016-06-09 ENCOUNTER — Encounter: Payer: Self-pay | Admitting: Vascular Surgery

## 2016-06-10 ENCOUNTER — Encounter: Payer: Medicare Other | Admitting: Vascular Surgery

## 2016-06-10 DIAGNOSIS — Z992 Dependence on renal dialysis: Secondary | ICD-10-CM | POA: Diagnosis not present

## 2016-06-10 DIAGNOSIS — N2581 Secondary hyperparathyroidism of renal origin: Secondary | ICD-10-CM | POA: Diagnosis not present

## 2016-06-10 DIAGNOSIS — D509 Iron deficiency anemia, unspecified: Secondary | ICD-10-CM | POA: Diagnosis not present

## 2016-06-10 DIAGNOSIS — N186 End stage renal disease: Secondary | ICD-10-CM | POA: Diagnosis not present

## 2016-06-10 DIAGNOSIS — D631 Anemia in chronic kidney disease: Secondary | ICD-10-CM | POA: Diagnosis not present

## 2016-06-12 DIAGNOSIS — Z992 Dependence on renal dialysis: Secondary | ICD-10-CM | POA: Diagnosis not present

## 2016-06-12 DIAGNOSIS — D509 Iron deficiency anemia, unspecified: Secondary | ICD-10-CM | POA: Diagnosis not present

## 2016-06-12 DIAGNOSIS — N2581 Secondary hyperparathyroidism of renal origin: Secondary | ICD-10-CM | POA: Diagnosis not present

## 2016-06-12 DIAGNOSIS — N186 End stage renal disease: Secondary | ICD-10-CM | POA: Diagnosis not present

## 2016-06-12 DIAGNOSIS — D631 Anemia in chronic kidney disease: Secondary | ICD-10-CM | POA: Diagnosis not present

## 2016-06-15 DIAGNOSIS — Z992 Dependence on renal dialysis: Secondary | ICD-10-CM | POA: Diagnosis not present

## 2016-06-15 DIAGNOSIS — D509 Iron deficiency anemia, unspecified: Secondary | ICD-10-CM | POA: Diagnosis not present

## 2016-06-15 DIAGNOSIS — D631 Anemia in chronic kidney disease: Secondary | ICD-10-CM | POA: Diagnosis not present

## 2016-06-15 DIAGNOSIS — N186 End stage renal disease: Secondary | ICD-10-CM | POA: Diagnosis not present

## 2016-06-15 DIAGNOSIS — N2581 Secondary hyperparathyroidism of renal origin: Secondary | ICD-10-CM | POA: Diagnosis not present

## 2016-06-17 DIAGNOSIS — N2581 Secondary hyperparathyroidism of renal origin: Secondary | ICD-10-CM | POA: Diagnosis not present

## 2016-06-17 DIAGNOSIS — D631 Anemia in chronic kidney disease: Secondary | ICD-10-CM | POA: Diagnosis not present

## 2016-06-17 DIAGNOSIS — N186 End stage renal disease: Secondary | ICD-10-CM | POA: Diagnosis not present

## 2016-06-17 DIAGNOSIS — D509 Iron deficiency anemia, unspecified: Secondary | ICD-10-CM | POA: Diagnosis not present

## 2016-06-17 DIAGNOSIS — Z992 Dependence on renal dialysis: Secondary | ICD-10-CM | POA: Diagnosis not present

## 2016-06-19 DIAGNOSIS — Z992 Dependence on renal dialysis: Secondary | ICD-10-CM | POA: Diagnosis not present

## 2016-06-19 DIAGNOSIS — D509 Iron deficiency anemia, unspecified: Secondary | ICD-10-CM | POA: Diagnosis not present

## 2016-06-19 DIAGNOSIS — D631 Anemia in chronic kidney disease: Secondary | ICD-10-CM | POA: Diagnosis not present

## 2016-06-19 DIAGNOSIS — N2581 Secondary hyperparathyroidism of renal origin: Secondary | ICD-10-CM | POA: Diagnosis not present

## 2016-06-19 DIAGNOSIS — N186 End stage renal disease: Secondary | ICD-10-CM | POA: Diagnosis not present

## 2016-06-22 DIAGNOSIS — N186 End stage renal disease: Secondary | ICD-10-CM | POA: Diagnosis not present

## 2016-06-22 DIAGNOSIS — D631 Anemia in chronic kidney disease: Secondary | ICD-10-CM | POA: Diagnosis not present

## 2016-06-22 DIAGNOSIS — N2581 Secondary hyperparathyroidism of renal origin: Secondary | ICD-10-CM | POA: Diagnosis not present

## 2016-06-22 DIAGNOSIS — Z992 Dependence on renal dialysis: Secondary | ICD-10-CM | POA: Diagnosis not present

## 2016-06-22 DIAGNOSIS — D509 Iron deficiency anemia, unspecified: Secondary | ICD-10-CM | POA: Diagnosis not present

## 2016-06-24 DIAGNOSIS — N186 End stage renal disease: Secondary | ICD-10-CM | POA: Diagnosis not present

## 2016-06-24 DIAGNOSIS — N2581 Secondary hyperparathyroidism of renal origin: Secondary | ICD-10-CM | POA: Diagnosis not present

## 2016-06-24 DIAGNOSIS — Z992 Dependence on renal dialysis: Secondary | ICD-10-CM | POA: Diagnosis not present

## 2016-06-24 DIAGNOSIS — D631 Anemia in chronic kidney disease: Secondary | ICD-10-CM | POA: Diagnosis not present

## 2016-06-24 DIAGNOSIS — D509 Iron deficiency anemia, unspecified: Secondary | ICD-10-CM | POA: Diagnosis not present

## 2016-06-26 DIAGNOSIS — N186 End stage renal disease: Secondary | ICD-10-CM | POA: Diagnosis not present

## 2016-06-26 DIAGNOSIS — Z992 Dependence on renal dialysis: Secondary | ICD-10-CM | POA: Diagnosis not present

## 2016-06-26 DIAGNOSIS — N2581 Secondary hyperparathyroidism of renal origin: Secondary | ICD-10-CM | POA: Diagnosis not present

## 2016-06-26 DIAGNOSIS — D509 Iron deficiency anemia, unspecified: Secondary | ICD-10-CM | POA: Diagnosis not present

## 2016-06-26 DIAGNOSIS — D631 Anemia in chronic kidney disease: Secondary | ICD-10-CM | POA: Diagnosis not present

## 2016-06-29 DIAGNOSIS — N186 End stage renal disease: Secondary | ICD-10-CM | POA: Diagnosis not present

## 2016-06-29 DIAGNOSIS — N2581 Secondary hyperparathyroidism of renal origin: Secondary | ICD-10-CM | POA: Diagnosis not present

## 2016-06-29 DIAGNOSIS — D631 Anemia in chronic kidney disease: Secondary | ICD-10-CM | POA: Diagnosis not present

## 2016-06-29 DIAGNOSIS — Z992 Dependence on renal dialysis: Secondary | ICD-10-CM | POA: Diagnosis not present

## 2016-06-29 DIAGNOSIS — D509 Iron deficiency anemia, unspecified: Secondary | ICD-10-CM | POA: Diagnosis not present

## 2016-07-01 DIAGNOSIS — N2581 Secondary hyperparathyroidism of renal origin: Secondary | ICD-10-CM | POA: Diagnosis not present

## 2016-07-01 DIAGNOSIS — D631 Anemia in chronic kidney disease: Secondary | ICD-10-CM | POA: Diagnosis not present

## 2016-07-01 DIAGNOSIS — N186 End stage renal disease: Secondary | ICD-10-CM | POA: Diagnosis not present

## 2016-07-01 DIAGNOSIS — Z992 Dependence on renal dialysis: Secondary | ICD-10-CM | POA: Diagnosis not present

## 2016-07-01 DIAGNOSIS — D509 Iron deficiency anemia, unspecified: Secondary | ICD-10-CM | POA: Diagnosis not present

## 2016-07-03 DIAGNOSIS — N186 End stage renal disease: Secondary | ICD-10-CM | POA: Diagnosis not present

## 2016-07-03 DIAGNOSIS — D509 Iron deficiency anemia, unspecified: Secondary | ICD-10-CM | POA: Diagnosis not present

## 2016-07-03 DIAGNOSIS — N2581 Secondary hyperparathyroidism of renal origin: Secondary | ICD-10-CM | POA: Diagnosis not present

## 2016-07-03 DIAGNOSIS — Z992 Dependence on renal dialysis: Secondary | ICD-10-CM | POA: Diagnosis not present

## 2016-07-03 DIAGNOSIS — D631 Anemia in chronic kidney disease: Secondary | ICD-10-CM | POA: Diagnosis not present

## 2016-07-06 DIAGNOSIS — N2581 Secondary hyperparathyroidism of renal origin: Secondary | ICD-10-CM | POA: Diagnosis not present

## 2016-07-06 DIAGNOSIS — D509 Iron deficiency anemia, unspecified: Secondary | ICD-10-CM | POA: Diagnosis not present

## 2016-07-06 DIAGNOSIS — N186 End stage renal disease: Secondary | ICD-10-CM | POA: Diagnosis not present

## 2016-07-06 DIAGNOSIS — D631 Anemia in chronic kidney disease: Secondary | ICD-10-CM | POA: Diagnosis not present

## 2016-07-06 DIAGNOSIS — Z992 Dependence on renal dialysis: Secondary | ICD-10-CM | POA: Diagnosis not present

## 2016-07-08 DIAGNOSIS — Z992 Dependence on renal dialysis: Secondary | ICD-10-CM | POA: Diagnosis not present

## 2016-07-08 DIAGNOSIS — D509 Iron deficiency anemia, unspecified: Secondary | ICD-10-CM | POA: Diagnosis not present

## 2016-07-08 DIAGNOSIS — D631 Anemia in chronic kidney disease: Secondary | ICD-10-CM | POA: Diagnosis not present

## 2016-07-08 DIAGNOSIS — N2581 Secondary hyperparathyroidism of renal origin: Secondary | ICD-10-CM | POA: Diagnosis not present

## 2016-07-08 DIAGNOSIS — N186 End stage renal disease: Secondary | ICD-10-CM | POA: Diagnosis not present

## 2016-07-09 ENCOUNTER — Encounter: Payer: Medicare Other | Admitting: Vascular Surgery

## 2016-07-10 DIAGNOSIS — N2581 Secondary hyperparathyroidism of renal origin: Secondary | ICD-10-CM | POA: Diagnosis not present

## 2016-07-10 DIAGNOSIS — D509 Iron deficiency anemia, unspecified: Secondary | ICD-10-CM | POA: Diagnosis not present

## 2016-07-10 DIAGNOSIS — Z992 Dependence on renal dialysis: Secondary | ICD-10-CM | POA: Diagnosis not present

## 2016-07-10 DIAGNOSIS — D631 Anemia in chronic kidney disease: Secondary | ICD-10-CM | POA: Diagnosis not present

## 2016-07-10 DIAGNOSIS — N186 End stage renal disease: Secondary | ICD-10-CM | POA: Diagnosis not present

## 2016-07-13 DIAGNOSIS — D509 Iron deficiency anemia, unspecified: Secondary | ICD-10-CM | POA: Diagnosis not present

## 2016-07-13 DIAGNOSIS — N186 End stage renal disease: Secondary | ICD-10-CM | POA: Diagnosis not present

## 2016-07-13 DIAGNOSIS — D631 Anemia in chronic kidney disease: Secondary | ICD-10-CM | POA: Diagnosis not present

## 2016-07-13 DIAGNOSIS — Z992 Dependence on renal dialysis: Secondary | ICD-10-CM | POA: Diagnosis not present

## 2016-07-13 DIAGNOSIS — N2581 Secondary hyperparathyroidism of renal origin: Secondary | ICD-10-CM | POA: Diagnosis not present

## 2016-07-15 DIAGNOSIS — N186 End stage renal disease: Secondary | ICD-10-CM | POA: Diagnosis not present

## 2016-07-15 DIAGNOSIS — D631 Anemia in chronic kidney disease: Secondary | ICD-10-CM | POA: Diagnosis not present

## 2016-07-15 DIAGNOSIS — N2581 Secondary hyperparathyroidism of renal origin: Secondary | ICD-10-CM | POA: Diagnosis not present

## 2016-07-15 DIAGNOSIS — D509 Iron deficiency anemia, unspecified: Secondary | ICD-10-CM | POA: Diagnosis not present

## 2016-07-15 DIAGNOSIS — Z992 Dependence on renal dialysis: Secondary | ICD-10-CM | POA: Diagnosis not present

## 2016-07-17 DIAGNOSIS — Z992 Dependence on renal dialysis: Secondary | ICD-10-CM | POA: Diagnosis not present

## 2016-07-17 DIAGNOSIS — N2581 Secondary hyperparathyroidism of renal origin: Secondary | ICD-10-CM | POA: Diagnosis not present

## 2016-07-17 DIAGNOSIS — N186 End stage renal disease: Secondary | ICD-10-CM | POA: Diagnosis not present

## 2016-07-17 DIAGNOSIS — D631 Anemia in chronic kidney disease: Secondary | ICD-10-CM | POA: Diagnosis not present

## 2016-07-17 DIAGNOSIS — D509 Iron deficiency anemia, unspecified: Secondary | ICD-10-CM | POA: Diagnosis not present

## 2016-07-20 DIAGNOSIS — N2581 Secondary hyperparathyroidism of renal origin: Secondary | ICD-10-CM | POA: Diagnosis not present

## 2016-07-20 DIAGNOSIS — Z992 Dependence on renal dialysis: Secondary | ICD-10-CM | POA: Diagnosis not present

## 2016-07-20 DIAGNOSIS — D509 Iron deficiency anemia, unspecified: Secondary | ICD-10-CM | POA: Diagnosis not present

## 2016-07-20 DIAGNOSIS — D631 Anemia in chronic kidney disease: Secondary | ICD-10-CM | POA: Diagnosis not present

## 2016-07-20 DIAGNOSIS — N186 End stage renal disease: Secondary | ICD-10-CM | POA: Diagnosis not present

## 2016-07-22 ENCOUNTER — Other Ambulatory Visit: Payer: Self-pay | Admitting: *Deleted

## 2016-07-22 DIAGNOSIS — E119 Type 2 diabetes mellitus without complications: Secondary | ICD-10-CM | POA: Diagnosis not present

## 2016-07-22 DIAGNOSIS — N186 End stage renal disease: Secondary | ICD-10-CM | POA: Diagnosis not present

## 2016-07-22 DIAGNOSIS — Z4931 Encounter for adequacy testing for hemodialysis: Secondary | ICD-10-CM

## 2016-07-22 DIAGNOSIS — N2581 Secondary hyperparathyroidism of renal origin: Secondary | ICD-10-CM | POA: Diagnosis not present

## 2016-07-22 DIAGNOSIS — D509 Iron deficiency anemia, unspecified: Secondary | ICD-10-CM | POA: Diagnosis not present

## 2016-07-22 DIAGNOSIS — D631 Anemia in chronic kidney disease: Secondary | ICD-10-CM | POA: Diagnosis not present

## 2016-07-22 DIAGNOSIS — Z992 Dependence on renal dialysis: Secondary | ICD-10-CM | POA: Diagnosis not present

## 2016-07-22 DIAGNOSIS — Z794 Long term (current) use of insulin: Secondary | ICD-10-CM | POA: Diagnosis not present

## 2016-07-23 ENCOUNTER — Ambulatory Visit (HOSPITAL_COMMUNITY)
Admission: RE | Admit: 2016-07-23 | Discharge: 2016-07-23 | Disposition: A | Discharge status: Home / Self Care | Payer: Medicare Other | Source: Ambulatory Visit | Attending: Vascular Surgery | Admitting: Vascular Surgery

## 2016-07-23 ENCOUNTER — Encounter: Payer: Self-pay | Admitting: Vascular Surgery

## 2016-07-23 ENCOUNTER — Ambulatory Visit (INDEPENDENT_AMBULATORY_CARE_PROVIDER_SITE_OTHER): Payer: Medicare Other | Admitting: Vascular Surgery

## 2016-07-23 ENCOUNTER — Other Ambulatory Visit: Payer: Self-pay

## 2016-07-23 VITALS — BP 197/108 | HR 79 | Temp 98.0°F | Resp 18 | Ht 74.0 in | Wt 301.0 lb

## 2016-07-23 DIAGNOSIS — N186 End stage renal disease: Secondary | ICD-10-CM | POA: Diagnosis not present

## 2016-07-23 DIAGNOSIS — Z992 Dependence on renal dialysis: Secondary | ICD-10-CM | POA: Diagnosis not present

## 2016-07-23 DIAGNOSIS — Z4931 Encounter for adequacy testing for hemodialysis: Secondary | ICD-10-CM | POA: Diagnosis not present

## 2016-07-23 NOTE — Progress Notes (Signed)
Referring Physician: Hinda Lenis   Patient name: Jacob [name]      MRN: [id number]        DOB: 1951/06/08          Sex: male   REASON FOR CONSULT: poor fistula flow   HPI: [name] is a 65 y.o. male, has a functioning but poorly functioning right radiocephalic AV fistula. He was seen for the similar problem October 2017. At that point he had a fistulogram performed by Dr. Bridgett Larsson which showed a diffusely diseased small inflow radial artery as well as a long segment narrowing of the proximal radiocephalic AV fistula. He was noted to have poor flow on dialysis. The fistula has been revised several times in the past.  The fistula was originally placed in 2014. Dr Bridgett Larsson revised the fistula in 2015. Most recent revision was in 01/2016 by Dr. Kellie Simmering. Patch angioplasty was placed on the proximal portion of the fistula. Other medical problems include anemia, congestive heart failure, hypertension, diabetes all of which are currently stable. The patient dialyzes on Monday Wednesday Friday in [place].       Past Medical History:  Diagnosis Date  . Anemia in chronic kidney disease(285.21)    . Arthritis    . Chronic systolic heart failure ([place])    . End stage renal disease ([place])      Hemodialysis on Monday, Wednesday, Friday- in [place]  . Essential hypertension, benign    . GERD (gastroesophageal reflux disease)      sometimes- not taking omperazole  . Glaucoma    . History of blood transfusion    . Nonischemic cardiomyopathy ([place])      Hypertension and regular alcohol use, LVEF 40-45%  . Shortness of breath dyspnea      with exertion  . Stroke [facility]) 2016    right shoulder weakness  . Type 2 diabetes mellitus ([place])           Past Surgical History:  Procedure Laterality Date  . AV FISTULA PLACEMENT        Hx: of  . AV FISTULA PLACEMENT Right 05/04/2013    Procedure: ARTERIOVENOUS (AV) FISTULA CREATION- RIGHT ARM;  Surgeon: Conrad Leonard, MD;  Location: [place];  Service: Vascular;  Laterality:  Right;  Ultrasound guided  . CATARACT EXTRACTION W/PHACO Left 07/16/2014    Procedure: CATARACT EXTRACTION PHACO AND INTRAOCULAR LENS PLACEMENT (IOC);  Surgeon: [name], MD;  Location: [facility];  Service: Ophthalmology;  Laterality: Left;  CDE:8.86  . CATARACT EXTRACTION W/PHACO Right 07/30/2014    Procedure: CATARACT EXTRACTION PHACO AND INTRAOCULAR LENS PLACEMENT (IOC);  Surgeon: [name], MD;  Location: [facility];  Service: Ophthalmology;  Laterality: Right;  CDE 8.99  . COLONOSCOPY        Hx: of  . FISTULOGRAM N/A 05/04/2013    Procedure: CENTRAL VENOGRAM;  Surgeon: Conrad [place], MD;  Location: [place];  Service: Vascular;  Laterality: N/A;  . INSERTION OF DIALYSIS CATHETER Right 05/04/2013    Procedure: INSERTION OF DIALYSIS CATHETER;  Surgeon: Conrad [place], MD;  Location: [place];  Service: Vascular;  Laterality: Right;  Ultrasound guided  . LIGATION OF ARTERIOVENOUS  FISTULA Left 05/04/2013    Procedure: LIGATION OF LEFT RADIAL CEPHALIC ARTERIOVENOUS  FISTULA;  Surgeon: Conrad [place], MD;  Location: [place];  Service: Vascular;  Laterality: Left;  Ultrasound guided  . LUMBAR SPINE SURGERY      . PERIPHERAL VASCULAR CATHETERIZATION N/A 01/24/2015  Procedure: Fistulagram;  Surgeon: Conrad Emery, MD;  Location: [place] [facility];  Service: Cardiovascular;  Laterality: N/A;  . REVISON OF ARTERIOVENOUS FISTULA Right 01/02/2014    Procedure: REVISON OF ARTERIOVENOUS FISTULA ANASTOMOSIS;  Surgeon: Conrad [place], MD;  Location: [place];  Service: Vascular;  Laterality: Right;  . REVISON OF ARTERIOVENOUS FISTULA Right 01/02/2016    Procedure: REVISION OF RADIOCEPHALIC ARTERIOVENOUS FISTULA  with BOVINE PATCH ANGIOPLASTY RIGHT RADIAL ARTERY;  Surgeon: Mal [name], MD;  Location: [place];  Service: Vascular;  Laterality: Right;  . SHUNTOGRAM N/A 11/07/2013    Procedure: Fistulogram;  Surgeon: [name], MD;  Location[facility];  Service: Cardiovascular;  Laterality: N/A;            Family History    Problem Relation Age of Onset  . Heart attack Brother        59  . Heart disease Brother        before age 78  . Hypertension Brother    . Heart attack Brother        62  . Heart attack Brother        24  . Diabetes Mother    . Hypertension Mother    . Heart disease Mother    . Heart disease Father    . Hypertension Father    . Other Father        amputation  . Hypertension Sister    . Heart disease Sister    . Vision loss Maternal Uncle        SOCIAL HISTORY: Social History    Social History  . Marital status: Single      Spouse name: N/A  . Number of children: N/A  . Years of education: N/A       Occupational History  . Not on file.          Social History Main Topics  . Smoking status: Never Smoker  . Smokeless tobacco: Never Used  . Alcohol use Yes        Comment: 1- fifth of gin a wek  . Drug use: No  . Sexual activity: Yes      Birth control/ protection: None        Other Topics Concern  . Not on file       Social History Narrative  . No narrative on file           Allergies  Allergen Reactions  . Bee Venom Anaphylaxis  . Penicillins Swelling      Swelling of throat and tongue Has patient had a PCN reaction causing immediate rash, facial/tongue/throat swelling, SOB or lightheadedness with hypotension: No Has patient had a PCN reaction causing severe rash involving mucus membranes or skin necrosis: No Has patient had a PCN reaction that required hospitalization No Has patient had a PCN reaction occurring within the last 10 years: No If all of the above answers are "NO", then may proceed with Cephalosporin use.            Current Outpatient Prescriptions  Medication Sig Dispense Refill  . acetaminophen (TYLENOL) 500 MG tablet Take 1,000 mg by mouth every 6 (six) hours as needed for mild pain.      [name] aspirin EC 325 MG tablet Take 325 mg by mouth daily.      . B Complex-C-Folic Acid (DIALYVITE TABLET) TABS Take 1 tablet by mouth every  Monday, Wednesday, and Friday.       [name]  carvedilol (COREG) 12.5 MG tablet Take 12.5 mg by mouth every morning. *Not taken on dialysis days, Mondays, Wednesdays, and Fridays      . cinacalcet (SENSIPAR) 30 MG tablet Take 30 mg by mouth daily.      . cyclobenzaprine (FLEXERIL) 10 MG tablet Take 10 mg by mouth 3 (three) times daily as needed for muscle spasms.      [name] etodolac (LODINE) 400 MG tablet Take 400 mg by mouth 2 (two) times daily as needed for mild pain or moderate pain.      [name] HYDROcodone-acetaminophen (NORCO/VICODIN) 5-325 MG tablet Take 1 tablet by mouth every 6 (six) hours as needed for moderate pain. 15 tablet 0  . LANTUS SOLOSTAR 100 UNIT/ML Solostar Pen Inject 40 Units into the skin at bedtime.       [name] omeprazole (PRILOSEC) 40 MG capsule Take 40 mg by mouth daily.        . ramipril (ALTACE) 2.5 MG capsule Take 2.5 mg by mouth daily.      [name] RENVELA 800 MG tablet Take 800-2,400 mg by mouth 3 (three) times daily with meals. Take 3 tablets with meals and 1 tablet with snacks      . zolpidem (AMBIEN) 10 MG tablet Take 10 mg by mouth at bedtime as needed for sleep.       [name] EPIPEN 2-PAK 0.3 MG/0.3ML SOAJ injection Inject 0.3 mg into the muscle once as needed (For anaphylaxis.).         No current facility-administered medications for this visit.       ROS:    General:  No weight loss, Fever, chills   Cardiac: No recent episodes of chest pain/pressure, no shortness of breath at rest.  + shortness of breath with exertion.  Denies history of atrial fibrillation or irregular heartbeat   Pulmonary: No home oxygen, no productive cough, no hemoptysis,  No asthma or wheezing       Physical Examination    Vitals:   07/23/16 1013 07/23/16 1024  BP: (!) 211/101 (!) 197/108  Pulse: 79   Resp: 18   Temp: 98 F (36.7 C)   TempSrc: Oral   SpO2: 98%   Weight: (!) 301 lb (136.5 kg)   Height: 6\' 2"  (1.88 m)      General:  Alert and oriented, no acute distress Extremity Pulses:  Audible  bruit with no palpable thrill right forearm AV fistula, occluded left arm AV fistula Musculoskeletal: No deformity or edema      Neurologic: Upper and lower extremity motor 5/5 and symmetric   DATA:  I reviewed the images of the patient's previous fistulogram. The cephalic vein is patent throughout its course all the way into the subclavian vein. The proximal existing right radiocephalic AV fistula has a diffusely diseased small right radial artery with diffuse narrowing of the proximal aspect of the fistula.   ASSESSMENT:  Poorly functioning right radiocephalic AV fistula multiple revisions small inflow radial artery.     PLAN:   At this point I think we should abandon the right radiocephalic AV fistula.   I discussed with the patient today converting this to a right brachiocephalic AV fistula. We will ligate his right radiocephalic AV fistula. He will also require a palindrome catheter until the new right arm AV fistula has developed.  This is scheduled with my partner Dr. Scot Dock 07/28/2016. Risks benefits possible complications and procedure details were discussed with the patient today including but not limited to bleeding infection ischemic  steal non-maturation of the fistula possible requirement for further procedures in the future. He understands and agrees to proceed.   [name], MD [facility] Office: [phone] Pager: [phone]

## 2016-07-24 ENCOUNTER — Encounter (HOSPITAL_COMMUNITY): Payer: Self-pay | Admitting: *Deleted

## 2016-07-24 DIAGNOSIS — N186 End stage renal disease: Secondary | ICD-10-CM | POA: Diagnosis not present

## 2016-07-24 DIAGNOSIS — D509 Iron deficiency anemia, unspecified: Secondary | ICD-10-CM | POA: Diagnosis not present

## 2016-07-24 DIAGNOSIS — D631 Anemia in chronic kidney disease: Secondary | ICD-10-CM | POA: Diagnosis not present

## 2016-07-24 DIAGNOSIS — N2581 Secondary hyperparathyroidism of renal origin: Secondary | ICD-10-CM | POA: Diagnosis not present

## 2016-07-24 DIAGNOSIS — Z992 Dependence on renal dialysis: Secondary | ICD-10-CM | POA: Diagnosis not present

## 2016-07-26 DIAGNOSIS — D631 Anemia in chronic kidney disease: Secondary | ICD-10-CM | POA: Diagnosis not present

## 2016-07-26 DIAGNOSIS — N2581 Secondary hyperparathyroidism of renal origin: Secondary | ICD-10-CM | POA: Diagnosis not present

## 2016-07-26 DIAGNOSIS — Z992 Dependence on renal dialysis: Secondary | ICD-10-CM | POA: Diagnosis not present

## 2016-07-26 DIAGNOSIS — N186 End stage renal disease: Secondary | ICD-10-CM | POA: Diagnosis not present

## 2016-07-26 DIAGNOSIS — D509 Iron deficiency anemia, unspecified: Secondary | ICD-10-CM | POA: Diagnosis not present

## 2016-07-27 MED ORDER — VANCOMYCIN HCL 10 G IV SOLR
1500.0000 mg | INTRAVENOUS | Status: AC
  Administered 2016-07-28: 1500 mg via INTRAVENOUS
  Filled 2016-07-27 (×2): qty 1500

## 2016-07-27 MED ORDER — SODIUM CHLORIDE 0.9 % IV SOLN
INTRAVENOUS | Status: DC
  Administered 2016-07-28 (×2): via INTRAVENOUS

## 2016-07-28 ENCOUNTER — Ambulatory Visit (HOSPITAL_COMMUNITY)
Admission: RE | Admit: 2016-07-28 | Discharge: 2016-07-28 | Disposition: A | Discharge status: Home / Self Care | Payer: Medicare Other | Source: Ambulatory Visit | Attending: Vascular Surgery | Admitting: Vascular Surgery

## 2016-07-28 ENCOUNTER — Ambulatory Visit (HOSPITAL_COMMUNITY): Payer: Medicare Other | Admitting: Anesthesiology

## 2016-07-28 ENCOUNTER — Other Ambulatory Visit: Payer: Self-pay | Admitting: *Deleted

## 2016-07-28 ENCOUNTER — Encounter (HOSPITAL_COMMUNITY): Payer: Self-pay | Admitting: *Deleted

## 2016-07-28 ENCOUNTER — Encounter (HOSPITAL_COMMUNITY)
Admission: RE | Disposition: A | Discharge status: Home / Self Care | Payer: Self-pay | Source: Ambulatory Visit | Attending: Vascular Surgery

## 2016-07-28 ENCOUNTER — Telehealth: Payer: Self-pay | Admitting: Vascular Surgery

## 2016-07-28 ENCOUNTER — Ambulatory Visit (HOSPITAL_COMMUNITY): Payer: Medicare Other

## 2016-07-28 DIAGNOSIS — Z794 Long term (current) use of insulin: Secondary | ICD-10-CM | POA: Diagnosis not present

## 2016-07-28 DIAGNOSIS — I132 Hypertensive heart and chronic kidney disease with heart failure and with stage 5 chronic kidney disease, or end stage renal disease: Secondary | ICD-10-CM | POA: Diagnosis not present

## 2016-07-28 DIAGNOSIS — E1122 Type 2 diabetes mellitus with diabetic chronic kidney disease: Secondary | ICD-10-CM | POA: Insufficient documentation

## 2016-07-28 DIAGNOSIS — Z4931 Encounter for adequacy testing for hemodialysis: Secondary | ICD-10-CM

## 2016-07-28 DIAGNOSIS — Z8673 Personal history of transient ischemic attack (TIA), and cerebral infarction without residual deficits: Secondary | ICD-10-CM | POA: Diagnosis not present

## 2016-07-28 DIAGNOSIS — I5022 Chronic systolic (congestive) heart failure: Secondary | ICD-10-CM | POA: Diagnosis not present

## 2016-07-28 DIAGNOSIS — N186 End stage renal disease: Secondary | ICD-10-CM | POA: Diagnosis not present

## 2016-07-28 DIAGNOSIS — Z7982 Long term (current) use of aspirin: Secondary | ICD-10-CM | POA: Diagnosis not present

## 2016-07-28 DIAGNOSIS — K219 Gastro-esophageal reflux disease without esophagitis: Secondary | ICD-10-CM | POA: Diagnosis not present

## 2016-07-28 DIAGNOSIS — Z992 Dependence on renal dialysis: Secondary | ICD-10-CM | POA: Insufficient documentation

## 2016-07-28 DIAGNOSIS — I428 Other cardiomyopathies: Secondary | ICD-10-CM | POA: Insufficient documentation

## 2016-07-28 DIAGNOSIS — D631 Anemia in chronic kidney disease: Secondary | ICD-10-CM | POA: Insufficient documentation

## 2016-07-28 DIAGNOSIS — Z95828 Presence of other vascular implants and grafts: Secondary | ICD-10-CM

## 2016-07-28 DIAGNOSIS — Z79899 Other long term (current) drug therapy: Secondary | ICD-10-CM | POA: Diagnosis not present

## 2016-07-28 DIAGNOSIS — I120 Hypertensive chronic kidney disease with stage 5 chronic kidney disease or end stage renal disease: Secondary | ICD-10-CM | POA: Diagnosis not present

## 2016-07-28 DIAGNOSIS — T82898A Other specified complication of vascular prosthetic devices, implants and grafts, initial encounter: Secondary | ICD-10-CM | POA: Diagnosis not present

## 2016-07-28 DIAGNOSIS — N185 Chronic kidney disease, stage 5: Secondary | ICD-10-CM | POA: Diagnosis not present

## 2016-07-28 DIAGNOSIS — Z452 Encounter for adjustment and management of vascular access device: Secondary | ICD-10-CM | POA: Diagnosis not present

## 2016-07-28 DIAGNOSIS — Z419 Encounter for procedure for purposes other than remedying health state, unspecified: Secondary | ICD-10-CM

## 2016-07-28 HISTORY — DX: Insomnia, unspecified: G47.00

## 2016-07-28 HISTORY — PX: FISTULA SUPERFICIALIZATION: SHX6341

## 2016-07-28 HISTORY — PX: INSERTION OF DIALYSIS CATHETER: SHX1324

## 2016-07-28 HISTORY — PX: LIGATION OF ARTERIOVENOUS  FISTULA: SHX5948

## 2016-07-28 HISTORY — PX: AV FISTULA PLACEMENT: SHX1204

## 2016-07-28 LAB — POCT I-STAT 4, (NA,K, GLUC, HGB,HCT)
Glucose, Bld: 71 mg/dL (ref 65–99)
HCT: 40 % (ref 39.0–52.0)
Hemoglobin: 13.6 g/dL (ref 13.0–17.0)
Potassium: 4 mmol/L (ref 3.5–5.1)
Sodium: 141 mmol/L (ref 135–145)

## 2016-07-28 LAB — GLUCOSE, CAPILLARY
Glucose-Capillary: 61 mg/dL — ABNORMAL LOW (ref 65–99)
Glucose-Capillary: 79 mg/dL (ref 65–99)

## 2016-07-28 SURGERY — LIGATION OF ARTERIOVENOUS  FISTULA
Anesthesia: General | Site: Neck | Laterality: Right

## 2016-07-28 MED ORDER — FENTANYL CITRATE (PF) 100 MCG/2ML IJ SOLN: 25.0000 ug | INTRAMUSCULAR | Status: DC | PRN

## 2016-07-28 MED ORDER — OXYCODONE-ACETAMINOPHEN 5-325 MG PO TABS: 1.0000 | ORAL_TABLET | Freq: Four times a day (QID) | ORAL | 0 refills | Status: DC | PRN

## 2016-07-28 MED ORDER — 0.9 % SODIUM CHLORIDE (POUR BTL) OPTIME
TOPICAL | Status: DC | PRN
  Administered 2016-07-28: 1000 mL

## 2016-07-28 MED ORDER — PROPOFOL 10 MG/ML IV BOLUS
INTRAVENOUS | Status: AC
  Filled 2016-07-28: qty 20

## 2016-07-28 MED ORDER — GLYCOPYRROLATE 0.2 MG/ML IJ SOLN
INTRAMUSCULAR | Status: DC | PRN
  Administered 2016-07-28: .8 mg via INTRAVENOUS

## 2016-07-28 MED ORDER — DEXAMETHASONE SODIUM PHOSPHATE 10 MG/ML IJ SOLN
INTRAMUSCULAR | Status: DC | PRN
  Administered 2016-07-28: 5 mg via INTRAVENOUS

## 2016-07-28 MED ORDER — PAPAVERINE HCL 30 MG/ML IJ SOLN
INTRAMUSCULAR | Status: DC | PRN
  Administered 2016-07-28: 60 mg via INTRAVENOUS

## 2016-07-28 MED ORDER — ONDANSETRON HCL 4 MG/2ML IJ SOLN: 4.0000 mg | Freq: Once | INTRAMUSCULAR | Status: DC | PRN

## 2016-07-28 MED ORDER — HEPARIN SODIUM (PORCINE) 1000 UNIT/ML IJ SOLN
INTRAMUSCULAR | Status: AC
  Filled 2016-07-28: qty 1

## 2016-07-28 MED ORDER — NEOSTIGMINE METHYLSULFATE 10 MG/10ML IV SOLN
INTRAVENOUS | Status: DC | PRN
  Administered 2016-07-28: 5 mg via INTRAVENOUS

## 2016-07-28 MED ORDER — PHENYLEPHRINE HCL 10 MG/ML IJ SOLN
INTRAMUSCULAR | Status: DC | PRN
  Administered 2016-07-28 (×4): 80 ug via INTRAVENOUS

## 2016-07-28 MED ORDER — OXYCODONE HCL 5 MG/5ML PO SOLN: 5.0000 mg | Freq: Once | ORAL | Status: DC | PRN

## 2016-07-28 MED ORDER — MIDAZOLAM HCL 2 MG/2ML IJ SOLN
INTRAMUSCULAR | Status: AC
  Filled 2016-07-28: qty 2

## 2016-07-28 MED ORDER — HEPARIN SODIUM (PORCINE) 1000 UNIT/ML IJ SOLN
INTRAMUSCULAR | Status: DC | PRN
  Administered 2016-07-28: 10 mL via INTRAVENOUS

## 2016-07-28 MED ORDER — PROPOFOL 10 MG/ML IV BOLUS
INTRAVENOUS | Status: DC | PRN
  Administered 2016-07-28: 100 mg via INTRAVENOUS

## 2016-07-28 MED ORDER — DEXTROSE 5 % IV SOLN
INTRAVENOUS | Status: DC | PRN
  Administered 2016-07-28: 40 ug/min via INTRAVENOUS

## 2016-07-28 MED ORDER — CHLORHEXIDINE GLUCONATE CLOTH 2 % EX PADS: 6.0000 | MEDICATED_PAD | Freq: Once | CUTANEOUS | Status: DC

## 2016-07-28 MED ORDER — DEXTROSE 50 % IV SOLN
INTRAVENOUS | Status: DC | PRN
  Administered 2016-07-28: 1 via INTRAVENOUS

## 2016-07-28 MED ORDER — PAPAVERINE HCL 30 MG/ML IJ SOLN
INTRAMUSCULAR | Status: AC
  Filled 2016-07-28: qty 2

## 2016-07-28 MED ORDER — FENTANYL CITRATE (PF) 100 MCG/2ML IJ SOLN
INTRAMUSCULAR | Status: DC | PRN
  Administered 2016-07-28: 25 ug via INTRAVENOUS
  Administered 2016-07-28: 100 ug via INTRAVENOUS

## 2016-07-28 MED ORDER — LIDOCAINE HCL (PF) 1 % IJ SOLN
INTRAMUSCULAR | Status: AC
  Filled 2016-07-28: qty 30

## 2016-07-28 MED ORDER — ONDANSETRON HCL 4 MG/2ML IJ SOLN
INTRAMUSCULAR | Status: DC | PRN
  Administered 2016-07-28: 4 mg via INTRAVENOUS

## 2016-07-28 MED ORDER — DEXTROSE 50 % IV SOLN
INTRAVENOUS | Status: AC
  Filled 2016-07-28: qty 50

## 2016-07-28 MED ORDER — SODIUM CHLORIDE 0.9 % IV SOLN
INTRAVENOUS | Status: DC | PRN
  Administered 2016-07-28: 500 mL

## 2016-07-28 MED ORDER — SUCCINYLCHOLINE CHLORIDE 20 MG/ML IJ SOLN
INTRAMUSCULAR | Status: DC | PRN
  Administered 2016-07-28: 140 mg via INTRAVENOUS

## 2016-07-28 MED ORDER — OXYCODONE HCL 5 MG PO TABS: 5.0000 mg | ORAL_TABLET | Freq: Once | ORAL | Status: DC | PRN

## 2016-07-28 MED ORDER — FENTANYL CITRATE (PF) 100 MCG/2ML IJ SOLN
INTRAMUSCULAR | Status: AC
  Filled 2016-07-28: qty 4

## 2016-07-28 MED ORDER — ROCURONIUM BROMIDE 100 MG/10ML IV SOLN
INTRAVENOUS | Status: DC | PRN
  Administered 2016-07-28: 30 mg via INTRAVENOUS

## 2016-07-28 MED ORDER — PROTAMINE SULFATE 10 MG/ML IV SOLN
INTRAVENOUS | Status: DC | PRN
  Administered 2016-07-28 (×2): 25 mg via INTRAVENOUS

## 2016-07-28 MED ORDER — HEPARIN SODIUM (PORCINE) 1000 UNIT/ML IJ SOLN
INTRAMUSCULAR | Status: DC | PRN
  Administered 2016-07-28: 3.4 mL

## 2016-07-28 MED ORDER — LIDOCAINE HCL (CARDIAC) 20 MG/ML IV SOLN
INTRAVENOUS | Status: DC | PRN
  Administered 2016-07-28: 80 mg via INTRAVENOUS

## 2016-07-28 MED ORDER — IOPAMIDOL (ISOVUE-300) INJECTION 61%
INTRAVENOUS | Status: AC
  Filled 2016-07-28: qty 50

## 2016-07-28 SURGICAL SUPPLY — 69 items
ADH SKN CLS APL DERMABOND .7 (GAUZE/BANDAGES/DRESSINGS) ×9
ARMBAND PINK RESTRICT EXTREMIT (MISCELLANEOUS) ×8 IMPLANT
BAG DECANTER FOR FLEXI CONT (MISCELLANEOUS) ×3 IMPLANT
BANDAGE ACE 4X5 VEL STRL LF (GAUZE/BANDAGES/DRESSINGS) ×2 IMPLANT
BIOPATCH RED 1 DISK 7.0 (GAUZE/BANDAGES/DRESSINGS) ×4 IMPLANT
BIOPATCH RED 1IN DISK 7.0MM (GAUZE/BANDAGES/DRESSINGS) ×1
CANISTER SUCTION 2500CC (MISCELLANEOUS) ×5 IMPLANT
CANNULA VESSEL 3MM 2 BLNT TIP (CANNULA) ×5 IMPLANT
CATH PALINDROME RT-P 15FX19CM (CATHETERS) IMPLANT
CATH PALINDROME RT-P 15FX23CM (CATHETERS) ×2 IMPLANT
CATH PALINDROME RT-P 15FX28CM (CATHETERS) IMPLANT
CATH PALINDROME RT-P 15FX55CM (CATHETERS) IMPLANT
CHLORAPREP W/TINT 26ML (MISCELLANEOUS) ×5 IMPLANT
CLIP TI MEDIUM 6 (CLIP) ×5 IMPLANT
CLIP TI WIDE RED SMALL 6 (CLIP) ×9 IMPLANT
COVER PROBE W GEL 5X96 (DRAPES) ×2 IMPLANT
COVER SURGICAL LIGHT HANDLE (MISCELLANEOUS) ×3 IMPLANT
DECANTER SPIKE VIAL GLASS SM (MISCELLANEOUS) ×3 IMPLANT
DERMABOND ADVANCED (GAUZE/BANDAGES/DRESSINGS) ×6
DERMABOND ADVANCED .7 DNX12 (GAUZE/BANDAGES/DRESSINGS) ×3 IMPLANT
DRAPE C-ARM 42X72 X-RAY (DRAPES) ×5 IMPLANT
DRAPE CHEST BREAST 15X10 FENES (DRAPES) ×5 IMPLANT
DRSG COVADERM 4X6 (GAUZE/BANDAGES/DRESSINGS) ×2 IMPLANT
ELECT REM PT RETURN 9FT ADLT (ELECTROSURGICAL) ×5
ELECTRODE REM PT RTRN 9FT ADLT (ELECTROSURGICAL) ×3 IMPLANT
GAUZE SPONGE 2X2 8PLY STRL LF (GAUZE/BANDAGES/DRESSINGS) IMPLANT
GAUZE SPONGE 4X4 12PLY STRL (GAUZE/BANDAGES/DRESSINGS) ×3 IMPLANT
GAUZE SPONGE 4X4 16PLY XRAY LF (GAUZE/BANDAGES/DRESSINGS) ×5 IMPLANT
GEL ULTRASOUND 20GR AQUASONIC (MISCELLANEOUS) IMPLANT
GLOVE BIO SURGEON STRL SZ7.5 (GLOVE) ×5 IMPLANT
GLOVE BIOGEL PI IND STRL 6.5 (GLOVE) IMPLANT
GLOVE BIOGEL PI IND STRL 7.0 (GLOVE) IMPLANT
GLOVE BIOGEL PI IND STRL 8 (GLOVE) ×3 IMPLANT
GLOVE BIOGEL PI INDICATOR 6.5 (GLOVE) ×2
GLOVE BIOGEL PI INDICATOR 7.0 (GLOVE) ×2
GLOVE BIOGEL PI INDICATOR 8 (GLOVE) ×4
GOWN STRL REUS W/ TWL LRG LVL3 (GOWN DISPOSABLE) ×9 IMPLANT
GOWN STRL REUS W/TWL LRG LVL3 (GOWN DISPOSABLE) ×25
KIT BASIN OR (CUSTOM PROCEDURE TRAY) ×5 IMPLANT
KIT ROOM TURNOVER OR (KITS) ×5 IMPLANT
NDL 18GX1X1/2 (RX/OR ONLY) (NEEDLE) ×3 IMPLANT
NDL HYPO 25GX1X1/2 BEV (NEEDLE) ×3 IMPLANT
NEEDLE 18GX1X1/2 (RX/OR ONLY) (NEEDLE) ×10 IMPLANT
NEEDLE 22X1 1/2 (OR ONLY) (NEEDLE) ×3 IMPLANT
NEEDLE HYPO 25GX1X1/2 BEV (NEEDLE) IMPLANT
NS IRRIG 1000ML POUR BTL (IV SOLUTION) ×5 IMPLANT
PACK CV ACCESS (CUSTOM PROCEDURE TRAY) ×5 IMPLANT
PACK SURGICAL SETUP 50X90 (CUSTOM PROCEDURE TRAY) ×3 IMPLANT
PAD ARMBOARD 7.5X6 YLW CONV (MISCELLANEOUS) ×10 IMPLANT
SPONGE GAUZE 2X2 STER 10/PKG (GAUZE/BANDAGES/DRESSINGS)
SPONGE GAUZE 4X4 12PLY STER LF (GAUZE/BANDAGES/DRESSINGS) ×4 IMPLANT
SPONGE SURGIFOAM ABS GEL 100 (HEMOSTASIS) IMPLANT
SUT ETHILON 3 0 PS 1 (SUTURE) ×5 IMPLANT
SUT PROLENE 6 0 BV (SUTURE) ×5 IMPLANT
SUT SILK 0 TIES 10X30 (SUTURE) ×3 IMPLANT
SUT VIC AB 3-0 SH 27 (SUTURE) ×10
SUT VIC AB 3-0 SH 27X BRD (SUTURE) ×3 IMPLANT
SUT VICRYL 4-0 PS2 18IN ABS (SUTURE) ×11 IMPLANT
SWAB COLLECTION DEVICE MRSA (MISCELLANEOUS) IMPLANT
SYR 20CC LL (SYRINGE) ×8 IMPLANT
SYR 5ML LL (SYRINGE) ×8 IMPLANT
SYR CONTROL 10ML LL (SYRINGE) ×3 IMPLANT
SYRINGE 10CC LL (SYRINGE) ×3 IMPLANT
SYRINGE 3CC LL L/F (MISCELLANEOUS) ×2 IMPLANT
TOWEL OR 17X24 6PK STRL BLUE (TOWEL DISPOSABLE) ×5 IMPLANT
TOWEL OR 17X26 10 PK STRL BLUE (TOWEL DISPOSABLE) ×5 IMPLANT
TUBE ANAEROBIC SPECIMEN COL (MISCELLANEOUS) IMPLANT
UNDERPAD 30X30 (UNDERPADS AND DIAPERS) ×5 IMPLANT
WATER STERILE IRR 1000ML POUR (IV SOLUTION) ×3 IMPLANT

## 2016-07-28 NOTE — Telephone Encounter (Signed)
Sched lab 08/28/16 at 4:00. Sched MD 09/02/16 at 10:45. Lm on hm# to inform pt of appt.

## 2016-07-28 NOTE — Op Note (Signed)
NAME: Jacob [name]    MRN: [id number] DOB: 10-01-1950    DATE OF OPERATION: 07/28/2016  PREOP DIAGNOSIS: End-stage renal disease.  POSTOP DIAGNOSIS: Same  PROCEDURE:  1. Ultrasound-guided placement of right IJ 23 cm tunneled dialysis catheter 2. Ligation of right radiocephalic AV fistula 3. Creation of new right brachial cephalic AV fistula. 4. Superficialization of right brachiocephalic AV fistula  SURGEON: Judeth Cornfield. Scot Dock, MD, FACS  ASSIST: Silva Bandy, [facility]  ANESTHESIA: Gen.   EBL: Minimal  INDICATIONS: Jacob [name] is a 65 y.o. male who has a poorly functioning right radial cephalic AV fistula. The patient was evaluated in the office by Dr. Juanda Crumble fields and placement of a new fistula was recommended with ligation of the forearm fistula and also placement of a catheter.  FINDINGS: The upper arm cephalic vein was marginal in size and was lateral therefore I had to mobilize a fairly long segment to reach the brachial artery which was quite deep. The vein was also deep and I superficialized the vein.  TECHNIQUE: The patient was taken to the operating room and received a general anesthetic. The neck and upper chest were prepped and draped in usual sterile fashion. Under ultrasound guidance, the right IJ was cannulated and a guidewire introduced into the superior vena cava under fluoroscopic control. The tract over the wire was dilated and then a dilator and peel-away sheath were advanced over the wire and the wire and dilator removed. The catheter was passed through the peel-away sheath and positioned in the right atrium. The exit site for the catheter was selected and the skin anesthetized between the 2 areas. The cath was brought to the tunnel The appropriate length and the distal ports were attached. Both ports withdrew easily with and flushed with heparinized saline and filled with concentrated heparin. The catheter was secured at its exit site with 4-0 nylon suture. The IJ  cannulation site was closed with 40 subcutaneous stitch. Sterile dressing was applied.  Next the right arm was prepped and draped in usual sterile fashion. The upper arm cephalic vein looked reasonable in size. A transverse incision was made just above the antecubital level. Here the cephalic vein was dissected free. It was quite far lateral. The vein was mobilized with branches divided between clips and 3-0 silk ties. Through the same incision the brachial artery was then a 5 Benny the fascia. Of note the artery was quite medial and also quite deep. For this reason, an order to have adequate vein to reach the artery to make a separate longitudinal incision below the antecubital level where more of the cephalic vein was mobilized and dissected free. This was ligated distally and then irrigated up with heparinized saline. The patient was then heparinized. The brachial artery was approximately distally and longitudinal arteriotomy was made. The vein was sewn inside the artery using continuous 6-0 Prolene suture. At the completion was an excellent thrill in the fistula. The vein was quite deep and I elected to try to superficialize it.   I mobilized the vein from the incision above the antecubital level circumferentially and debridement adipose tissue on top of the vein. I made a separate longitudinal incision in the mid upper arm and further dissected out the vein through this incision to allow it to come closer to the surface. Hemostasis was obtained in these wounds.  Next a small incision was made over the fistula at the wrist with the vein was dissected free and ligated with a 2-0  silk tie. This incision was closed with a 4-0 Vicryl. The antecubital incision was closed with a deeper 3-0 Vicryl and skin closed with 4-0 Vicryl. The vein harvest site below the antecubital level was closed with a deeper 3-0 Vicryl the skin closed with 4-0 Vicryl. The incision in the mid upper arm was closed with a 4-0 Vicryl.  Sterile dressing was applied. The patient tolerated the procedure well and was transferred to the recovery room in stable condition. All needle sponge count were correct.    [name], MD, FACS [facility]  DATE OF DICTATION:   07/28/2016

## 2016-07-28 NOTE — Anesthesia Postprocedure Evaluation (Signed)
Anesthesia Post Note  Patient: Jacob [name]  Procedure(s) Performed: Procedure(s) (LRB): LIGATION OF RADIOCEPHALIC ARTERIOVENOUS  FISTULA (Right) BRACHIOCEPHALIC ARTERIOVENOUS (AV) FISTULA CREATION (Right) INSERTION OF DIALYSIS CATHETER (N/A) FISTULA SUPERFICIALIZATION (Right)  Patient location during evaluation: PACU Anesthesia Type: General Level of consciousness: awake, awake and alert and oriented Pain management: pain level controlled Vital Signs Assessment: post-procedure vital signs reviewed and stable Respiratory status: spontaneous breathing, respiratory function stable, nonlabored ventilation and patient connected to nasal cannula oxygen Cardiovascular status: blood pressure returned to baseline Anesthetic complications: no       Last Vitals:  Vitals:   07/28/16 1154 07/28/16 1200  BP:  (!) 181/80  Pulse: 81 82  Resp: 20 17  Temp:  36.1 C    Last Pain:  Vitals:   07/28/16 1200  TempSrc:   PainSc: 0-No pain                 Benjimen Kelley COKER

## 2016-07-28 NOTE — Progress Notes (Signed)
Notified Dr. Jenita Seashore of pt.'s blood pressure and blood sugar of 71. Pt. Asymptomatic. Stated to recheck sugar at 0715.  Notified Dr. Scot Dock to clarify which arm surgery is to done on.Dr Scot Dock stated he would be over to talk to pt.

## 2016-07-28 NOTE — Progress Notes (Signed)
The plan is for ligation of RIGHT forearm AVF, creation of a new RIGHT AVF and placement of a HD catheter. Discussed with pt.   [name], MD, [place] [phone] Office: [phone]

## 2016-07-28 NOTE — Telephone Encounter (Signed)
-----   Message from Mena Goes, RN sent at 07/28/2016 11:03 AM EST ----- Regarding: may be a duplicate appt request   ----- Message ----- From: [name], PA-C Sent: 07/28/2016  10:51 AM To: Vvs Charge Pool  S/p right brachiocephalic AVF, ligation of right radiocephalic AVF 02/40/97  F/u with CSD in 4-6 weeks with duplex  Thanks Maudie Mercury

## 2016-07-28 NOTE — Anesthesia Preprocedure Evaluation (Signed)
Anesthesia Evaluation  Patient identified by MRN, date of birth, ID band Patient awake    Reviewed: Allergy & Precautions, NPO status , Patient's Chart, lab work & pertinent test results  Airway Mallampati: II  TM Distance: >3 FB     Dental  (+) Edentulous Upper, Dental Advisory Given   Pulmonary    breath sounds clear to auscultation       Cardiovascular hypertension,  Rhythm:Regular Rate:Normal     Neuro/Psych    GI/Hepatic   Endo/Other  diabetes  Renal/GU      Musculoskeletal   Abdominal (+) + obese,   Peds  Hematology   Anesthesia Other Findings   Reproductive/Obstetrics                             Anesthesia Physical Anesthesia Plan  ASA: III  Anesthesia Plan: General   Post-op Pain Management:    Induction: Intravenous  Airway Management Planned: LMA  Additional Equipment:   Intra-op Plan:   Post-operative Plan:   Informed Consent: I have reviewed the patients History and Physical, chart, labs and discussed the procedure including the risks, benefits and alternatives for the proposed anesthesia with the patient or authorized representative who has indicated his/her understanding and acceptance.   Dental advisory given  Plan Discussed with: CRNA and Anesthesiologist  Anesthesia Plan Comments:         Anesthesia Quick Evaluation

## 2016-07-28 NOTE — Transfer of Care (Signed)
Immediate Anesthesia Transfer of Care Note  Patient: Jacob [name]  Procedure(s) Performed: Procedure(s): LIGATION OF RADIOCEPHALIC ARTERIOVENOUS  FISTULA (Right) BRACHIOCEPHALIC ARTERIOVENOUS (AV) FISTULA CREATION (Right) INSERTION OF DIALYSIS CATHETER (N/A) FISTULA SUPERFICIALIZATION (Right)  Patient Location: PACU  Anesthesia Type:General  Level of Consciousness: awake and alert   Airway & Oxygen Therapy: Patient Spontanous Breathing and Patient connected to nasal cannula oxygen  Post-op Assessment: Report given to RN and Post -op Vital signs reviewed and stable  Post vital signs: Reviewed and stable  Last Vitals:  Vitals:   07/28/16 0600 07/28/16 1130  BP: (!) 228/90   Pulse: 87   Resp: 19 (P) 18  Temp: 36.7 C (P) 36.2 C    Last Pain:  Vitals:   07/28/16 0600  TempSrc: Oral      Patients Stated Pain Goal: 3 (91/66/06 0045)  Complications: No apparent anesthesia complications

## 2016-07-28 NOTE — Progress Notes (Signed)
Left IJ CL dc'd with tip intact (CXR was OK). DSD to site.

## 2016-07-28 NOTE — Anesthesia Procedure Notes (Signed)
Procedure Name: Intubation Date/Time: 07/28/2016 8:23 AM Performed by: Ollen Bowl Pre-anesthesia Checklist: Patient identified, Emergency Drugs available, Suction available, Patient being monitored and Timeout performed Patient Re-evaluated:Patient Re-evaluated prior to inductionOxygen Delivery Method: Circle system utilized and Simple face mask Preoxygenation: Pre-oxygenation with 100% oxygen Intubation Type: IV induction Ventilation: Mask ventilation without difficulty Laryngoscope Size: Miller and 3 Grade View: Grade I Tube type: Oral Tube size: 7.5 mm Number of attempts: 1 Airway Equipment and Method: Patient positioned with wedge pillow and Stylet Placement Confirmation: ETT inserted through vocal cords under direct vision,  positive ETCO2 and breath sounds checked- equal and bilateral Secured at: 23 cm Tube secured with: Tape Dental Injury: Teeth and Oropharynx as per pre-operative assessment  Comments: OETT placed after unable to properly ventilate with #5 LMA

## 2016-07-29 ENCOUNTER — Emergency Department (HOSPITAL_COMMUNITY): Payer: Medicare Other

## 2016-07-29 ENCOUNTER — Emergency Department (HOSPITAL_COMMUNITY)
Admission: EM | Admit: 2016-07-29 | Discharge: 2016-07-29 | Disposition: A | Discharge status: Home / Self Care | Payer: Medicare Other | Attending: Emergency Medicine | Admitting: Emergency Medicine

## 2016-07-29 ENCOUNTER — Encounter (HOSPITAL_COMMUNITY): Payer: Self-pay | Admitting: *Deleted

## 2016-07-29 DIAGNOSIS — D631 Anemia in chronic kidney disease: Secondary | ICD-10-CM | POA: Diagnosis not present

## 2016-07-29 DIAGNOSIS — R918 Other nonspecific abnormal finding of lung field: Secondary | ICD-10-CM | POA: Insufficient documentation

## 2016-07-29 DIAGNOSIS — R221 Localized swelling, mass and lump, neck: Secondary | ICD-10-CM | POA: Diagnosis not present

## 2016-07-29 DIAGNOSIS — D509 Iron deficiency anemia, unspecified: Secondary | ICD-10-CM | POA: Diagnosis not present

## 2016-07-29 DIAGNOSIS — Z794 Long term (current) use of insulin: Secondary | ICD-10-CM | POA: Diagnosis not present

## 2016-07-29 DIAGNOSIS — I132 Hypertensive heart and chronic kidney disease with heart failure and with stage 5 chronic kidney disease, or end stage renal disease: Secondary | ICD-10-CM | POA: Insufficient documentation

## 2016-07-29 DIAGNOSIS — Z7982 Long term (current) use of aspirin: Secondary | ICD-10-CM | POA: Diagnosis not present

## 2016-07-29 DIAGNOSIS — Z992 Dependence on renal dialysis: Secondary | ICD-10-CM | POA: Diagnosis not present

## 2016-07-29 DIAGNOSIS — M542 Cervicalgia: Secondary | ICD-10-CM

## 2016-07-29 DIAGNOSIS — E1122 Type 2 diabetes mellitus with diabetic chronic kidney disease: Secondary | ICD-10-CM | POA: Insufficient documentation

## 2016-07-29 DIAGNOSIS — M7989 Other specified soft tissue disorders: Secondary | ICD-10-CM | POA: Insufficient documentation

## 2016-07-29 DIAGNOSIS — I6523 Occlusion and stenosis of bilateral carotid arteries: Secondary | ICD-10-CM | POA: Diagnosis not present

## 2016-07-29 DIAGNOSIS — N2581 Secondary hyperparathyroidism of renal origin: Secondary | ICD-10-CM | POA: Diagnosis not present

## 2016-07-29 DIAGNOSIS — N186 End stage renal disease: Secondary | ICD-10-CM | POA: Diagnosis not present

## 2016-07-29 DIAGNOSIS — I5022 Chronic systolic (congestive) heart failure: Secondary | ICD-10-CM | POA: Insufficient documentation

## 2016-07-29 DIAGNOSIS — Z79899 Other long term (current) drug therapy: Secondary | ICD-10-CM | POA: Diagnosis not present

## 2016-07-29 LAB — BASIC METABOLIC PANEL
Anion gap: 13 (ref 5–15)
BUN: 38 mg/dL — AB (ref 6–20)
CHLORIDE: 98 mmol/L — AB (ref 101–111)
CO2: 23 mmol/L (ref 22–32)
Calcium: 8.8 mg/dL — ABNORMAL LOW (ref 8.9–10.3)
Creatinine, Ser: 13.84 mg/dL — ABNORMAL HIGH (ref 0.61–1.24)
GFR calc Af Amer: 4 mL/min — ABNORMAL LOW (ref >60)
GFR, EST NON AFRICAN AMERICAN: 3 mL/min — AB (ref >60)
GLUCOSE: 171 mg/dL — AB (ref 65–99)
POTASSIUM: 4.7 mmol/L (ref 3.5–5.1)
Sodium: 134 mmol/L — ABNORMAL LOW (ref 135–145)

## 2016-07-29 LAB — CBC
HCT: 34.3 % — ABNORMAL LOW (ref 39.0–52.0)
Hemoglobin: 10.4 g/dL — ABNORMAL LOW (ref 13.0–17.0)
MCH: 28.8 pg (ref 26.0–34.0)
MCHC: 30.3 g/dL (ref 30.0–36.0)
MCV: 95 fL (ref 78.0–100.0)
PLATELETS: 337 10*3/uL (ref 150–400)
RBC: 3.61 MIL/uL — AB (ref 4.22–5.81)
RDW: 15 % (ref 11.5–15.5)
WBC: 9.6 10*3/uL (ref 4.0–10.5)

## 2016-07-29 LAB — CBG MONITORING, ED: Glucose-Capillary: 153 mg/dL — ABNORMAL HIGH (ref 65–99)

## 2016-07-29 MED ORDER — SODIUM CHLORIDE 0.9 % IV SOLN: Freq: Once | INTRAVENOUS | Status: DC

## 2016-07-29 MED ORDER — IOPAMIDOL (ISOVUE-370) INJECTION 76%
100.0000 mL | Freq: Once | INTRAVENOUS | Status: AC | PRN
  Administered 2016-07-29: 100 mL via INTRAVENOUS

## 2016-07-29 NOTE — ED Notes (Signed)
cbg of 153.

## 2016-07-29 NOTE — ED Triage Notes (Signed)
Pt c/o feeling like his throat is swelling; pt states he took a percocet last night around 2000 and began having swallowing difficulty around 1am;  Pt is also c/o swelling to feet and hands; pt had a dialysis catheter x 3 days ago

## 2016-07-29 NOTE — ED Notes (Signed)
Ns at 50 ml/hr per vo dr Venora Maples

## 2016-07-29 NOTE — ED Notes (Signed)
Pt returned from ct. Nad. No changes. Eating ice.

## 2016-07-29 NOTE — ED Notes (Signed)
Pt taken to CT.

## 2016-07-29 NOTE — ED Notes (Signed)
edp in to update.  

## 2016-07-29 NOTE — ED Provider Notes (Signed)
[facility] DEPT Provider Note   CSN: [id number] Arrival date & time: 07/29/16  0554     History   Chief Complaint Chief Complaint  Patient presents with  . Dysphagia    HPI Jacob [name] is a 65 y.o. male.  HPI Patient presents to the emergency department with complaints of throat swelling and fullness since last night around 1 AM.  He states that he did have an oxycodone at approximately 8 PM and thinks it could be related to this.  He also reports some swelling to his bilateral feet and hands.  He had a right internal jugular dialysis catheter placed yesterday.  He is scheduled for dialysis today.  He denies shortness of breath.  No fevers or chills.  He reports some pain and fullness with swallowing.  No other complaints at this time.  He states his symptoms are worse when he lays flat.   Past Medical History:  Diagnosis Date  . Anemia in chronic kidney disease(285.21)   . Arthritis   . Chronic systolic heart failure ([place])   . End stage renal disease ([place])    Hemodialysis on Monday, Wednesday, Friday- in [place]  . Essential hypertension, benign    takes Ramipril and Coreg daily  . GERD (gastroesophageal reflux disease)    takes Omeprazole daily  . Glaucoma   . History of blood transfusion   . Insomnia    takes Ambien nightly  . Nonischemic cardiomyopathy ([place])    Hypertension and regular alcohol use, LVEF 40-45%  . Shortness of breath dyspnea    with exertion  . Stroke [facility]) 2016   right shoulder weakness  . Type 2 diabetes mellitus ([place])    takes Lantus at bedtime    Patient Active Problem List   Diagnosis Date Noted  . Hyperkalemia 04/05/2016  . Inadequate dialysis 04/05/2016  . Other complications due to renal dialysis device, implant, and graft 04/28/2013  . Subclavian vein occlusion (HCC) 04/28/2013  . Preoperative cardiovascular examination 12/28/2012  . Mixed hyperlipidemia 06/16/2011  . Nonischemic cardiomyopathy ([place]) 11/20/2010  . End-stage renal  disease on hemodialysis ([place]) 11/20/2010  . Essential hypertension, benign 11/20/2010    Past Surgical History:  Procedure Laterality Date  . AV FISTULA PLACEMENT     Hx: of  . AV FISTULA PLACEMENT Right 05/04/2013   Procedure: ARTERIOVENOUS (AV) FISTULA CREATION- RIGHT ARM;  Surgeon: Conrad Barney, MD;  Location: [facility];  Service: Vascular;  Laterality: Right;  Ultrasound guided  . CATARACT EXTRACTION W/PHACO Left 07/16/2014   Procedure: CATARACT EXTRACTION PHACO AND INTRAOCULAR LENS PLACEMENT (IOC);  Surgeon: [name], MD;  Location: [facility];  Service: Ophthalmology;  Laterality: Left;  CDE:8.86  . CATARACT EXTRACTION W/PHACO Right 07/30/2014   Procedure: CATARACT EXTRACTION PHACO AND INTRAOCULAR LENS PLACEMENT (IOC);  Surgeon: [name], MD;  Location: [facility];  Service: Ophthalmology;  Laterality: Right;  CDE 8.99  . COLONOSCOPY     Hx: of  . FISTULOGRAM N/A 05/04/2013   Procedure: CENTRAL VENOGRAM;  Surgeon: Conrad Franklin, MD;  Location: [facility] [place];  Service: Vascular;  Laterality: N/A;  . INSERTION OF DIALYSIS CATHETER Right 05/04/2013   Procedure: INSERTION OF DIALYSIS CATHETER;  Surgeon: Conrad [place], MD;  Location: [place];  Service: Vascular;  Laterality: Right;  Ultrasound guided  . LIGATION OF ARTERIOVENOUS  FISTULA Left 05/04/2013   Procedure: LIGATION OF LEFT RADIAL CEPHALIC ARTERIOVENOUS  FISTULA;  Surgeon: Conrad Doniphan, MD;  Location: [facility];  Service: Vascular;  Laterality:  Left;  Ultrasound guided  . LUMBAR SPINE SURGERY    . PERIPHERAL VASCULAR CATHETERIZATION N/A 01/24/2015   Procedure: Fistulagram;  Surgeon: Conrad [place], MD;  Location: [place] [facility];  Service: Cardiovascular;  Laterality: N/A;  . PERIPHERAL VASCULAR CATHETERIZATION Right 06/04/2016   Procedure: A/V Shuntogram/Fistulagram;  Surgeon: Conrad [place], MD;  Location: [place] [facility];  Service: Cardiovascular;  Laterality: Right;  . REVISON OF ARTERIOVENOUS FISTULA Right 01/02/2014   Procedure: REVISON OF  ARTERIOVENOUS FISTULA ANASTOMOSIS;  Surgeon: Conrad [place], MD;  Location: [place];  Service: Vascular;  Laterality: Right;  . REVISON OF ARTERIOVENOUS FISTULA Right 01/02/2016   Procedure: REVISION OF RADIOCEPHALIC ARTERIOVENOUS FISTULA  with BOVINE PATCH ANGIOPLASTY RIGHT RADIAL ARTERY;  Surgeon: Mal [name], MD;  Location: [place];  Service: Vascular;  Laterality: Right;  . SHUNTOGRAM N/A 11/07/2013   Procedure: Fistulogram;  Surgeon: [name], MD;  Location: [facility] CATH LAB;  Service: Cardiovascular;  Laterality: N/A;       Home Medications    Prior to Admission medications   Medication Sig Start Date End Date Taking? Authorizing Provider  acetaminophen (TYLENOL) 500 MG tablet Take 1,000 mg by mouth every 6 (six) hours as needed for mild pain.    Historical Provider, MD  aspirin EC 325 MG tablet Take 325 mg by mouth daily.    Historical Provider, MD  B Complex-C-Folic Acid (DIALYVITE TABLET) TABS Take 1 tablet by mouth every Monday, Wednesday, and Friday.     Historical Provider, MD  carvedilol (COREG) 6.25 MG tablet Take 6.25 mg by mouth 2 (two) times daily with a meal. *Not taken on dialysis days, Mondays, Wednesdays, and Fridays    Historical Provider, MD  cinacalcet (SENSIPAR) 30 MG tablet Take 30 mg by mouth daily.    Historical Provider, MD  EPIPEN 2-PAK 0.3 MG/0.3ML SOAJ injection Inject 0.3 mg into the muscle once as needed (For anaphylaxis.).  04/18/13   Historical Provider, MD  etodolac (LODINE) 400 MG tablet Take 400 mg by mouth 2 (two) times daily as needed for mild pain or moderate pain.    Historical Provider, MD  LANTUS SOLOSTAR 100 UNIT/ML Solostar Pen Inject 40 Units into the skin at bedtime.  01/15/15   Historical Provider, MD  omeprazole (PRILOSEC) 40 MG capsule Take 40 mg by mouth daily.      Historical Provider, MD  oxyCODONE-acetaminophen (PERCOCET/ROXICET) 5-325 MG tablet Take 1 tablet by mouth every 6 (six) hours as needed. 07/28/16   [name], PA-C  ramipril  (ALTACE) 10 MG capsule Take 10 mg by mouth daily.     Historical Provider, MD  RENVELA 800 MG tablet Take 800-2,400 mg by mouth 3 (three) times daily with meals. Take 2400 mg by mouth 3 times daily with meals and 800 mg by mouth with snacks 05/25/13   Historical Provider, MD  zolpidem (AMBIEN) 10 MG tablet Take 10 mg by mouth at bedtime as needed for sleep.  11/09/15   Historical Provider, MD    Family History Family History  Problem Relation Age of Onset  . Heart attack Brother     75  . Heart disease Brother     before age 56  . Hypertension Brother   . Heart attack Brother     48  . Heart attack Brother     19  . Diabetes Mother   . Hypertension Mother   . Heart disease Mother   . Heart disease Father   .  Hypertension Father   . Other Father     amputation  . Hypertension Sister   . Heart disease Sister   . Vision loss Maternal Uncle     Social History Social History  Substance Use Topics  . Smoking status: Never Smoker  . Smokeless tobacco: Never Used  . Alcohol use Yes     Comment: 1- fifth of gin a wek     Allergies   Bee venom and Penicillins   Review of Systems Review of Systems  All other systems reviewed and are negative.    Physical Exam Updated Vital Signs BP 186/85 (BP Location: Left Arm)   Pulse 87   Resp 22   Ht 6\' 2"  (1.88 m)   Wt (!) 301 lb (136.5 kg)   SpO2 93%   BMI 38.65 kg/m   Physical Exam  Constitutional: He is oriented to person, place, and time. He appears well-developed and well-nourished.  HENT:  Head: Normocephalic and atraumatic.  Eyes: EOM are normal.  Neck:  Tunneled right IJ perm cath. Mild swelling around the IJ without pulsatile quality. No erythema or warmth around the site. No stridor  Cardiovascular: Normal rate, regular rhythm, normal heart sounds and intact distal pulses.   Pulmonary/Chest: Effort normal and breath sounds normal. No respiratory distress.  Abdominal: Soft. He exhibits no distension. There is no  tenderness.  Musculoskeletal: Normal range of motion.  Neurological: He is alert and oriented to person, place, and time.  Skin: Skin is warm and dry.  Psychiatric: He has a normal mood and affect. Judgment normal.  Nursing note and vitals reviewed.    ED Treatments / Results  Labs (all labs ordered are listed, but only abnormal results are displayed) Labs Reviewed  CBC  BASIC METABOLIC PANEL    EKG  EKG Interpretation None       Radiology Dg Chest Port 1 View  Result Date: 07/28/2016 CLINICAL DATA:  Dialysis catheter insertion. EXAM: PORTABLE CHEST 1 VIEW COMPARISON:  05/21/2016.  Chest x-ray 04/05/2016 . FINDINGS: Right IJ dialysis catheter noted with tip at the cavoatrial junction. Cardiomegaly with pulmonary venous congestion. Low lung volumes. No prominent pleural effusion. No pneumothorax. Left subclavian stent noted . IMPRESSION: 1. Right IJ dialysis catheter noted with tip at the cavoatrial junction. 2.  Cardiomegaly with pulmonary venous congestion. Electronically Signed   By: Marcello Moores  Register   On: 07/28/2016 11:44   Dg Fluoro Guide Cv Line-no Report  Result Date: 07/28/2016 There is no Radiologist interpretation  for this exam.   Procedures Procedures (including critical care time)   ++++++++++++++++++++++++++++++++++++++++++++++++++++  Angiocath insertion Performed by: Hoy Morn Consent: Verbal consent obtained. Risks and benefits: risks, benefits and alternatives were discussed Time out: Immediately prior to procedure a "time out" was called to verify the correct patient, procedure, equipment, support staff and site/side marked as required. Preparation: Patient was prepped and draped in the usual sterile fashion. Vein Location: Left basilic Ultrasound Guided Gauge: 20 Normal blood return and flush without difficulty Patient tolerance: Patient tolerated the procedure well with no immediate  complications.   ++++++++++++++++++++++++++++++++++++++++++++++++++++++   Medications Ordered in ED Medications - No data to display   Initial Impression / Assessment and Plan / ED Course  I have reviewed the triage vital signs and the nursing notes.  Pertinent labs & imaging results that were available during my care of the patient were reviewed by me and considered in my medical decision making (see chart for details).  Clinical Course  No obvious neck hematoma noted this time.  The patient however has a very large-caliber neck and this is difficult to evaluate given the size of his neck.  He will undergo CT imaging of his neck and chest to evaluate for possible vascular hematoma from right internal jugular dialysis catheter placement yesterday  Difficult IV access, thus I was contacted to by RN to place US guided IV  Care to Dr Wilson Singer  Final Clinical Impressions(s) / ED Diagnoses   Final diagnoses:  Neck pain  Neck swelling    New Prescriptions New Prescriptions   No medications on file     Jola Schmidt, MD 07/29/16 [id number]

## 2016-07-30 NOTE — Anesthesia Postprocedure Evaluation (Signed)
Anesthesia Post Note  Patient: Jacob [name]  Procedure(s) Performed: Procedure(s) (LRB): LIGATION OF RADIOCEPHALIC ARTERIOVENOUS  FISTULA (Right) BRACHIOCEPHALIC ARTERIOVENOUS (AV) FISTULA CREATION (Right) INSERTION OF DIALYSIS CATHETER (N/A) FISTULA SUPERFICIALIZATION (Right)  Patient location during evaluation: PACU Anesthesia Type: General Level of consciousness: awake and alert, awake and oriented Pain management: pain level controlled Vital Signs Assessment: post-procedure vital signs reviewed and stable Respiratory status: spontaneous breathing, nonlabored ventilation and respiratory function stable Cardiovascular status: blood pressure returned to baseline Postop Assessment: no headache       Last Vitals:  Vitals:   07/28/16 1200 07/28/16 1215  BP: (!) 181/80 (!) 170/84  Pulse: 82   Resp: 17   Temp: 36.1 C     Last Pain:  Vitals:   07/28/16 1200  TempSrc:   PainSc: 0-No pain                 Mykaela Arena COKER

## 2016-07-31 DIAGNOSIS — D509 Iron deficiency anemia, unspecified: Secondary | ICD-10-CM | POA: Diagnosis not present

## 2016-07-31 DIAGNOSIS — D631 Anemia in chronic kidney disease: Secondary | ICD-10-CM | POA: Diagnosis not present

## 2016-07-31 DIAGNOSIS — Z992 Dependence on renal dialysis: Secondary | ICD-10-CM | POA: Diagnosis not present

## 2016-07-31 DIAGNOSIS — N2581 Secondary hyperparathyroidism of renal origin: Secondary | ICD-10-CM | POA: Diagnosis not present

## 2016-07-31 DIAGNOSIS — N186 End stage renal disease: Secondary | ICD-10-CM | POA: Diagnosis not present

## 2016-08-02 DIAGNOSIS — Z992 Dependence on renal dialysis: Secondary | ICD-10-CM | POA: Diagnosis not present

## 2016-08-02 DIAGNOSIS — D509 Iron deficiency anemia, unspecified: Secondary | ICD-10-CM | POA: Diagnosis not present

## 2016-08-02 DIAGNOSIS — N2581 Secondary hyperparathyroidism of renal origin: Secondary | ICD-10-CM | POA: Diagnosis not present

## 2016-08-02 DIAGNOSIS — D631 Anemia in chronic kidney disease: Secondary | ICD-10-CM | POA: Diagnosis not present

## 2016-08-02 DIAGNOSIS — N186 End stage renal disease: Secondary | ICD-10-CM | POA: Diagnosis not present

## 2016-08-04 ENCOUNTER — Emergency Department (HOSPITAL_COMMUNITY)
Admission: EM | Admit: 2016-08-04 | Discharge: 2016-08-04 | Disposition: A | Discharge status: Home / Self Care | Payer: Medicare Other | Attending: Emergency Medicine | Admitting: Emergency Medicine

## 2016-08-04 ENCOUNTER — Encounter (HOSPITAL_COMMUNITY): Payer: Self-pay | Admitting: *Deleted

## 2016-08-04 DIAGNOSIS — Z794 Long term (current) use of insulin: Secondary | ICD-10-CM | POA: Insufficient documentation

## 2016-08-04 DIAGNOSIS — J029 Acute pharyngitis, unspecified: Secondary | ICD-10-CM | POA: Diagnosis not present

## 2016-08-04 DIAGNOSIS — I5022 Chronic systolic (congestive) heart failure: Secondary | ICD-10-CM | POA: Insufficient documentation

## 2016-08-04 DIAGNOSIS — Z7982 Long term (current) use of aspirin: Secondary | ICD-10-CM | POA: Insufficient documentation

## 2016-08-04 DIAGNOSIS — Z8673 Personal history of transient ischemic attack (TIA), and cerebral infarction without residual deficits: Secondary | ICD-10-CM | POA: Insufficient documentation

## 2016-08-04 DIAGNOSIS — Z79899 Other long term (current) drug therapy: Secondary | ICD-10-CM | POA: Diagnosis not present

## 2016-08-04 DIAGNOSIS — E1122 Type 2 diabetes mellitus with diabetic chronic kidney disease: Secondary | ICD-10-CM | POA: Diagnosis not present

## 2016-08-04 DIAGNOSIS — Z992 Dependence on renal dialysis: Secondary | ICD-10-CM | POA: Insufficient documentation

## 2016-08-04 DIAGNOSIS — I132 Hypertensive heart and chronic kidney disease with heart failure and with stage 5 chronic kidney disease, or end stage renal disease: Secondary | ICD-10-CM | POA: Diagnosis not present

## 2016-08-04 DIAGNOSIS — N186 End stage renal disease: Secondary | ICD-10-CM | POA: Insufficient documentation

## 2016-08-04 LAB — RAPID STREP SCREEN (MED CTR MEBANE ONLY): STREPTOCOCCUS, GROUP A SCREEN (DIRECT): NEGATIVE

## 2016-08-04 MED ORDER — DEXAMETHASONE SODIUM PHOSPHATE 10 MG/ML IJ SOLN
10.0000 mg | Freq: Once | INTRAMUSCULAR | Status: AC
  Administered 2016-08-04: 10 mg via INTRAMUSCULAR
  Filled 2016-08-04: qty 1

## 2016-08-04 NOTE — Discharge Instructions (Signed)
Salt water gargles. Follow up with Ear Nose Throat as referred.

## 2016-08-04 NOTE — ED Triage Notes (Signed)
Pt in from home c/o sore throat post procedure to place HD graft in R upper chest on 12/26. Pt seen at [facility] 12/27 for symtpoms, no drooling present, pt able to swallow, painful to swallow per pt, A&O x4

## 2016-08-04 NOTE — ED Provider Notes (Signed)
[facility] DEPT Provider Note   CSN: [id number] Arrival date & time: 08/04/16  3244     History   Chief Complaint Chief Complaint  Patient presents with  . Sore Throat    HPI COLIE FUGITT is a 66 y.o. male.  HPI TIERRA DIVELBISS is a 65 y.o. male with multiple medical problems, presents to ED with complaint of a sore throat. Pt had a right internal jugular dialysis catheter placed on 07/28/16. States since then has had sore and scratchy throat. He was seen at [facility] the next day, had CT angio neck and chest pain which did not show any acute vascular abnormality or swelling. States he has had pain in throat since then. He denies any fever or chills. No difficulty swallowing. Stats pain is worse at night. No other URI symptoms. Has been gargling Listerine with no relief.   Past Medical History:  Diagnosis Date  . Anemia in chronic kidney disease(285.21)   . Arthritis   . Chronic systolic heart failure ([place])   . End stage renal disease ([place])    Hemodialysis on Monday, Wednesday, Friday- in [place]  . Essential hypertension, benign    takes Ramipril and Coreg daily  . GERD (gastroesophageal reflux disease)    takes Omeprazole daily  . Glaucoma   . History of blood transfusion   . Insomnia    takes Ambien nightly  . Nonischemic cardiomyopathy ([place])    Hypertension and regular alcohol use, LVEF 40-45%  . Shortness of breath dyspnea    with exertion  . Stroke [facility]) 2016   right shoulder weakness  . Type 2 diabetes mellitus ([place])    takes Lantus at bedtime    Patient Active Problem List   Diagnosis Date Noted  . Hyperkalemia 04/05/2016  . Inadequate dialysis 04/05/2016  . Other complications due to renal dialysis device, implant, and graft 04/28/2013  . Subclavian vein occlusion (HCC) 04/28/2013  . Preoperative cardiovascular examination 12/28/2012  . Mixed hyperlipidemia 06/16/2011  . Nonischemic cardiomyopathy ([place]) 11/20/2010  . End-stage renal disease on  hemodialysis ([place]) 11/20/2010  . Essential hypertension, benign 11/20/2010    Past Surgical History:  Procedure Laterality Date  . AV FISTULA PLACEMENT     Hx: of  . AV FISTULA PLACEMENT Right 05/04/2013   Procedure: ARTERIOVENOUS (AV) FISTULA CREATION- RIGHT ARM;  Surgeon: Conrad Clare, MD;  Location: [place];  Service: Vascular;  Laterality: Right;  Ultrasound guided  . AV FISTULA PLACEMENT Right 07/28/2016   Procedure: BRACHIOCEPHALIC ARTERIOVENOUS (AV) FISTULA CREATION;  Surgeon: [name], MD;  Location: [place];  Service: Vascular;  Laterality: Right;  . CATARACT EXTRACTION W/PHACO Left 07/16/2014   Procedure: CATARACT EXTRACTION PHACO AND INTRAOCULAR LENS PLACEMENT (IOC);  Surgeon: [name], MD;  [facility];  Service: Ophthalmology;  Laterality: Left;  CDE:8.86  . CATARACT EXTRACTION W/PHACO Right 07/30/2014   Procedure: CATARACT EXTRACTION PHACO AND INTRAOCULAR LENS PLACEMENT (IOC);  Surgeon: [name], MD;  [facility]: [facility];  Service: Ophthalmology;  Laterality: Right;  CDE 8.99  . COLONOSCOPY     Hx: of  . FISTULA SUPERFICIALIZATION Right 07/28/2016   Procedure: FISTULA SUPERFICIALIZATION;  Surgeon: [name], MD;  Location: [facility];  Service: Vascular;  Laterality: Right;  . FISTULOGRAM N/A 05/04/2013   Procedure: CENTRAL VENOGRAM;  Surgeon: Conrad Aubrey, MD;  Location: [place];  Service: Vascular;  Laterality: N/A;  . INSERTION OF DIALYSIS CATHETER Right 05/04/2013   Procedure: INSERTION OF DIALYSIS CATHETER;  Surgeon: Conrad Traill, MD;  Location: [place];  Service: Vascular;  Laterality: Right;  Ultrasound guided  . INSERTION OF DIALYSIS CATHETER N/A 07/28/2016   Procedure: INSERTION OF DIALYSIS CATHETER;  Surgeon: [name], MD;  Location: [place];  Service: Vascular;  Laterality: N/A;  . LIGATION OF ARTERIOVENOUS  FISTULA Left 05/04/2013   Procedure: LIGATION OF LEFT RADIAL CEPHALIC ARTERIOVENOUS  FISTULA;  Surgeon: Conrad Groveland, MD;  Location:  [place];  Service: Vascular;  Laterality: Left;  Ultrasound guided  . LIGATION OF ARTERIOVENOUS  FISTULA Right 07/28/2016   Procedure: LIGATION OF RADIOCEPHALIC ARTERIOVENOUS  FISTULA;  Surgeon: [name], MD;  Location: [place];  Service: Vascular;  Laterality: Right;  . LUMBAR SPINE SURGERY    . PERIPHERAL VASCULAR CATHETERIZATION N/A 01/24/2015   Procedure: Fistulagram;  Surgeon: Conrad Teller, MD;  Location: [place] [facility];  Service: Cardiovascular;  Laterality: N/A;  . PERIPHERAL VASCULAR CATHETERIZATION Right 06/04/2016   Procedure: A/V Shuntogram/Fistulagram;  Surgeon: Conrad [place], MD;  Location: [place] CV LAB;  Service: Cardiovascular;  Laterality: Right;  . REVISON OF ARTERIOVENOUS FISTULA Right 01/02/2014   Procedure: REVISON OF ARTERIOVENOUS FISTULA ANASTOMOSIS;  Surgeon: Conrad Aspers, MD;  Location: [place];  Service: Vascular;  Laterality: Right;  . REVISON OF ARTERIOVENOUS FISTULA Right 01/02/2016   Procedure: REVISION OF RADIOCEPHALIC ARTERIOVENOUS FISTULA  with BOVINE PATCH ANGIOPLASTY RIGHT RADIAL ARTERY;  Surgeon: Mal [name], MD;  Location: [place];  Service: Vascular;  Laterality: Right;  . SHUNTOGRAM N/A 11/07/2013   Procedure: Fistulogram;  Surgeon: [name], MD;  Location: [facility] CATH LAB;  Service: Cardiovascular;  Laterality: N/A;       Home Medications    Prior to Admission medications   Medication Sig Start Date End Date Taking? Authorizing Provider  acetaminophen (TYLENOL) 500 MG tablet Take 1,000 mg by mouth every 6 (six) hours as needed for mild pain.    Historical Provider, MD  aspirin EC 325 MG tablet Take 325 mg by mouth daily.    Historical Provider, MD  B Complex-C-Folic Acid (DIALYVITE TABLET) TABS Take 1 tablet by mouth every Monday, Wednesday, and Friday.     Historical Provider, MD  carvedilol (COREG) 6.25 MG tablet Take 6.25 mg by mouth 2 (two) times daily with a meal. *Not taken on dialysis days, Mondays, Wednesdays, and Fridays     Historical Provider, MD  cinacalcet (SENSIPAR) 30 MG tablet Take 30 mg by mouth daily.    Historical Provider, MD  EPIPEN 2-PAK 0.3 MG/0.3ML SOAJ injection Inject 0.3 mg into the muscle once as needed (For anaphylaxis.).  04/18/13   Historical Provider, MD  etodolac (LODINE) 400 MG tablet Take 400 mg by mouth 2 (two) times daily as needed for mild pain or moderate pain.    Historical Provider, MD  LANTUS SOLOSTAR 100 UNIT/ML Solostar Pen Inject 40 Units into the skin at bedtime.  01/15/15   Historical Provider, MD  omeprazole (PRILOSEC) 40 MG capsule Take 40 mg by mouth daily.      Historical Provider, MD  oxyCODONE-acetaminophen (PERCOCET/ROXICET) 5-325 MG tablet Take 1 tablet by mouth every 6 (six) hours as needed. 07/28/16   [name], PA-C  ramipril (ALTACE) 10 MG capsule Take 10 mg by mouth daily.     Historical Provider, MD  RENVELA 800 MG tablet Take 800-2,400 mg by mouth 3 (three) times daily with meals. Take 2400 mg by mouth 3 times daily with meals and 800  mg by mouth with snacks 05/25/13   Historical Provider, MD  zolpidem (AMBIEN) 10 MG tablet Take 10 mg by mouth at bedtime as needed for sleep.  11/09/15   Historical Provider, MD    Family History Family History  Problem Relation Age of Onset  . Heart attack Brother     62  . Heart disease Brother     before age 53  . Hypertension Brother   . Heart attack Brother     33  . Heart attack Brother     54  . Diabetes Mother   . Hypertension Mother   . Heart disease Mother   . Heart disease Father   . Hypertension Father   . Other Father     amputation  . Hypertension Sister   . Heart disease Sister   . Vision loss Maternal Uncle     Social History Social History  Substance Use Topics  . Smoking status: Never Smoker  . Smokeless tobacco: Never Used  . Alcohol use Yes     Comment: 1- fifth of gin a wek     Allergies   Bee venom and Penicillins   Review of Systems Review of Systems  Constitutional: Negative  for chills and fever.  HENT: Positive for sore throat and voice change. Negative for congestion and trouble swallowing.   Respiratory: Negative for cough, chest tightness and shortness of breath.   Cardiovascular: Negative for chest pain, palpitations and leg swelling.  Gastrointestinal: Negative for abdominal distention, abdominal pain, diarrhea, nausea and vomiting.  Genitourinary: Negative for dysuria, frequency, hematuria and urgency.  Musculoskeletal: Negative for arthralgias, myalgias, neck pain and neck stiffness.  Skin: Negative for rash.  Allergic/Immunologic: Negative for immunocompromised state.  Neurological: Negative for dizziness, weakness, light-headedness, numbness and headaches.  All other systems reviewed and are negative.    Physical Exam Updated Vital Signs BP 194/80 (BP Location: Left Arm)   Pulse 70   Resp 18   Ht 6\' 4"  (1.93 m)   Wt (!) 138.3 kg   SpO2 97%   BMI 37.13 kg/m   Physical Exam  Constitutional: He appears well-developed and well-nourished. No distress.  HENT:  Oropharynx normal appearing. Uvula normal, midline. Tonsils are equal bilaterally  Eyes: Conjunctivae are normal.  Neck: Normal range of motion. Neck supple.  No palpable masses  Cardiovascular: Normal rate, regular rhythm and normal heart sounds.   Pulmonary/Chest: Effort normal and breath sounds normal. No respiratory distress.  Abdominal: He exhibits no distension.  Skin: Skin is warm and dry.  Nursing note and vitals reviewed.    ED Treatments / Results  Labs (all labs ordered are listed, but only abnormal results are displayed) Labs Reviewed  RAPID STREP SCREEN (NOT AT [facility])  CULTURE, GROUP A STREP [facility])    EKG  EKG Interpretation None       Radiology No results found.  Procedures Procedures (including critical care time)  Medications Ordered in ED Medications - No data to display   Initial Impression / Assessment and Plan / ED Course  I have reviewed the  triage vital signs and the nursing notes.  Pertinent labs & imaging results that were available during my care of the patient were reviewed by me and considered in my medical decision making (see chart for details).  Clinical Course     Pt with sore throat since his intubation for right subclavian catheter placement 1 week ago. Had negative CT angio chest and neck 6 days ago. Pt  in NAD. Some voice hoarseness noted, however swallowing and talking with no difficulty. Pt apparently had no idea that he was intubated. I had a long discussion with him about possible cause of his sore throat. At this time I do not think he needs any further emergent imaging or treatment given no neurological complaints, no difficulty swallowing or breathing. Decadron given 10mg  IM. wll dc home with close outpatient follow up, concern about vocal cord injury? Will given ENT referral.   Vitals:   08/04/16 0738 08/04/16 0739  BP: 194/80 194/80  Pulse: 71 70  Resp:  18  SpO2: 98% 97%  Weight: (!) 138.3 kg   Height: 6\' 4"  (1.93 m)      Final Clinical Impressions(s) / ED Diagnoses   Final diagnoses:  Sore throat    New Prescriptions Discharge Medication List as of 08/04/2016  9:39 AM       Jeannett Senior, PA-C 08/04/16 1459    Duffy Bruce, MD 08/06/16 [phone]

## 2016-08-05 DIAGNOSIS — Z992 Dependence on renal dialysis: Secondary | ICD-10-CM | POA: Diagnosis not present

## 2016-08-05 DIAGNOSIS — D631 Anemia in chronic kidney disease: Secondary | ICD-10-CM | POA: Diagnosis not present

## 2016-08-05 DIAGNOSIS — D509 Iron deficiency anemia, unspecified: Secondary | ICD-10-CM | POA: Diagnosis not present

## 2016-08-05 DIAGNOSIS — N2581 Secondary hyperparathyroidism of renal origin: Secondary | ICD-10-CM | POA: Diagnosis not present

## 2016-08-05 DIAGNOSIS — N186 End stage renal disease: Secondary | ICD-10-CM | POA: Diagnosis not present

## 2016-08-06 LAB — CULTURE, GROUP A STREP (THRC)

## 2016-08-07 DIAGNOSIS — N186 End stage renal disease: Secondary | ICD-10-CM | POA: Diagnosis not present

## 2016-08-07 DIAGNOSIS — N2581 Secondary hyperparathyroidism of renal origin: Secondary | ICD-10-CM | POA: Diagnosis not present

## 2016-08-07 DIAGNOSIS — D631 Anemia in chronic kidney disease: Secondary | ICD-10-CM | POA: Diagnosis not present

## 2016-08-07 DIAGNOSIS — D509 Iron deficiency anemia, unspecified: Secondary | ICD-10-CM | POA: Diagnosis not present

## 2016-08-07 DIAGNOSIS — Z992 Dependence on renal dialysis: Secondary | ICD-10-CM | POA: Diagnosis not present

## 2016-08-10 DIAGNOSIS — Z992 Dependence on renal dialysis: Secondary | ICD-10-CM | POA: Diagnosis not present

## 2016-08-10 DIAGNOSIS — D509 Iron deficiency anemia, unspecified: Secondary | ICD-10-CM | POA: Diagnosis not present

## 2016-08-10 DIAGNOSIS — N2581 Secondary hyperparathyroidism of renal origin: Secondary | ICD-10-CM | POA: Diagnosis not present

## 2016-08-10 DIAGNOSIS — D631 Anemia in chronic kidney disease: Secondary | ICD-10-CM | POA: Diagnosis not present

## 2016-08-10 DIAGNOSIS — N186 End stage renal disease: Secondary | ICD-10-CM | POA: Diagnosis not present

## 2016-08-12 DIAGNOSIS — Z992 Dependence on renal dialysis: Secondary | ICD-10-CM | POA: Diagnosis not present

## 2016-08-12 DIAGNOSIS — N186 End stage renal disease: Secondary | ICD-10-CM | POA: Diagnosis not present

## 2016-08-12 DIAGNOSIS — D631 Anemia in chronic kidney disease: Secondary | ICD-10-CM | POA: Diagnosis not present

## 2016-08-12 DIAGNOSIS — N2581 Secondary hyperparathyroidism of renal origin: Secondary | ICD-10-CM | POA: Diagnosis not present

## 2016-08-12 DIAGNOSIS — D509 Iron deficiency anemia, unspecified: Secondary | ICD-10-CM | POA: Diagnosis not present

## 2016-08-14 DIAGNOSIS — D509 Iron deficiency anemia, unspecified: Secondary | ICD-10-CM | POA: Diagnosis not present

## 2016-08-14 DIAGNOSIS — D631 Anemia in chronic kidney disease: Secondary | ICD-10-CM | POA: Diagnosis not present

## 2016-08-14 DIAGNOSIS — N186 End stage renal disease: Secondary | ICD-10-CM | POA: Diagnosis not present

## 2016-08-14 DIAGNOSIS — N2581 Secondary hyperparathyroidism of renal origin: Secondary | ICD-10-CM | POA: Diagnosis not present

## 2016-08-14 DIAGNOSIS — Z992 Dependence on renal dialysis: Secondary | ICD-10-CM | POA: Diagnosis not present

## 2016-08-17 DIAGNOSIS — Z992 Dependence on renal dialysis: Secondary | ICD-10-CM | POA: Diagnosis not present

## 2016-08-17 DIAGNOSIS — D509 Iron deficiency anemia, unspecified: Secondary | ICD-10-CM | POA: Diagnosis not present

## 2016-08-17 DIAGNOSIS — D631 Anemia in chronic kidney disease: Secondary | ICD-10-CM | POA: Diagnosis not present

## 2016-08-17 DIAGNOSIS — N2581 Secondary hyperparathyroidism of renal origin: Secondary | ICD-10-CM | POA: Diagnosis not present

## 2016-08-17 DIAGNOSIS — N186 End stage renal disease: Secondary | ICD-10-CM | POA: Diagnosis not present

## 2016-08-19 DIAGNOSIS — Z992 Dependence on renal dialysis: Secondary | ICD-10-CM | POA: Diagnosis not present

## 2016-08-19 DIAGNOSIS — D631 Anemia in chronic kidney disease: Secondary | ICD-10-CM | POA: Diagnosis not present

## 2016-08-19 DIAGNOSIS — N2581 Secondary hyperparathyroidism of renal origin: Secondary | ICD-10-CM | POA: Diagnosis not present

## 2016-08-19 DIAGNOSIS — D509 Iron deficiency anemia, unspecified: Secondary | ICD-10-CM | POA: Diagnosis not present

## 2016-08-19 DIAGNOSIS — N186 End stage renal disease: Secondary | ICD-10-CM | POA: Diagnosis not present

## 2016-08-21 DIAGNOSIS — N2581 Secondary hyperparathyroidism of renal origin: Secondary | ICD-10-CM | POA: Diagnosis not present

## 2016-08-21 DIAGNOSIS — N186 End stage renal disease: Secondary | ICD-10-CM | POA: Diagnosis not present

## 2016-08-21 DIAGNOSIS — D631 Anemia in chronic kidney disease: Secondary | ICD-10-CM | POA: Diagnosis not present

## 2016-08-21 DIAGNOSIS — D509 Iron deficiency anemia, unspecified: Secondary | ICD-10-CM | POA: Diagnosis not present

## 2016-08-21 DIAGNOSIS — Z992 Dependence on renal dialysis: Secondary | ICD-10-CM | POA: Diagnosis not present

## 2016-08-24 DIAGNOSIS — N186 End stage renal disease: Secondary | ICD-10-CM | POA: Diagnosis not present

## 2016-08-24 DIAGNOSIS — D509 Iron deficiency anemia, unspecified: Secondary | ICD-10-CM | POA: Diagnosis not present

## 2016-08-24 DIAGNOSIS — N2581 Secondary hyperparathyroidism of renal origin: Secondary | ICD-10-CM | POA: Diagnosis not present

## 2016-08-24 DIAGNOSIS — D631 Anemia in chronic kidney disease: Secondary | ICD-10-CM | POA: Diagnosis not present

## 2016-08-24 DIAGNOSIS — Z992 Dependence on renal dialysis: Secondary | ICD-10-CM | POA: Diagnosis not present

## 2016-08-26 DIAGNOSIS — D631 Anemia in chronic kidney disease: Secondary | ICD-10-CM | POA: Diagnosis not present

## 2016-08-26 DIAGNOSIS — Z992 Dependence on renal dialysis: Secondary | ICD-10-CM | POA: Diagnosis not present

## 2016-08-26 DIAGNOSIS — D509 Iron deficiency anemia, unspecified: Secondary | ICD-10-CM | POA: Diagnosis not present

## 2016-08-26 DIAGNOSIS — N2581 Secondary hyperparathyroidism of renal origin: Secondary | ICD-10-CM | POA: Diagnosis not present

## 2016-08-26 DIAGNOSIS — N186 End stage renal disease: Secondary | ICD-10-CM | POA: Diagnosis not present

## 2016-08-28 ENCOUNTER — Ambulatory Visit (HOSPITAL_COMMUNITY)
Admission: RE | Admit: 2016-08-28 | Discharge: 2016-08-28 | Disposition: A | Discharge status: Home / Self Care | Payer: Medicare Other | Source: Ambulatory Visit | Attending: Vascular Surgery | Admitting: Vascular Surgery

## 2016-08-28 DIAGNOSIS — N2581 Secondary hyperparathyroidism of renal origin: Secondary | ICD-10-CM | POA: Diagnosis not present

## 2016-08-28 DIAGNOSIS — N186 End stage renal disease: Secondary | ICD-10-CM | POA: Diagnosis not present

## 2016-08-28 DIAGNOSIS — Z992 Dependence on renal dialysis: Secondary | ICD-10-CM | POA: Diagnosis not present

## 2016-08-28 DIAGNOSIS — Z4931 Encounter for adequacy testing for hemodialysis: Secondary | ICD-10-CM | POA: Insufficient documentation

## 2016-08-28 DIAGNOSIS — D509 Iron deficiency anemia, unspecified: Secondary | ICD-10-CM | POA: Diagnosis not present

## 2016-08-28 DIAGNOSIS — D631 Anemia in chronic kidney disease: Secondary | ICD-10-CM | POA: Diagnosis not present

## 2016-08-31 ENCOUNTER — Encounter: Payer: Self-pay | Admitting: Vascular Surgery

## 2016-08-31 DIAGNOSIS — Z992 Dependence on renal dialysis: Secondary | ICD-10-CM | POA: Diagnosis not present

## 2016-08-31 DIAGNOSIS — D631 Anemia in chronic kidney disease: Secondary | ICD-10-CM | POA: Diagnosis not present

## 2016-08-31 DIAGNOSIS — D509 Iron deficiency anemia, unspecified: Secondary | ICD-10-CM | POA: Diagnosis not present

## 2016-08-31 DIAGNOSIS — N186 End stage renal disease: Secondary | ICD-10-CM | POA: Diagnosis not present

## 2016-08-31 DIAGNOSIS — N2581 Secondary hyperparathyroidism of renal origin: Secondary | ICD-10-CM | POA: Diagnosis not present

## 2016-09-02 ENCOUNTER — Ambulatory Visit (INDEPENDENT_AMBULATORY_CARE_PROVIDER_SITE_OTHER): Payer: Self-pay | Admitting: Vascular Surgery

## 2016-09-02 ENCOUNTER — Encounter: Payer: Medicare Other | Admitting: Vascular Surgery

## 2016-09-02 VITALS — BP 192/77 | HR 75 | Temp 98.0°F | Resp 20 | Ht 74.0 in | Wt 300.0 lb

## 2016-09-02 DIAGNOSIS — D509 Iron deficiency anemia, unspecified: Secondary | ICD-10-CM | POA: Diagnosis not present

## 2016-09-02 DIAGNOSIS — Z992 Dependence on renal dialysis: Secondary | ICD-10-CM | POA: Diagnosis not present

## 2016-09-02 DIAGNOSIS — D631 Anemia in chronic kidney disease: Secondary | ICD-10-CM | POA: Diagnosis not present

## 2016-09-02 DIAGNOSIS — N2581 Secondary hyperparathyroidism of renal origin: Secondary | ICD-10-CM | POA: Diagnosis not present

## 2016-09-02 DIAGNOSIS — N186 End stage renal disease: Secondary | ICD-10-CM

## 2016-09-02 NOTE — Progress Notes (Signed)
Patient name: Jacob [name] MRN: [id number] DOB: Jan 28, 1951 Sex: male  REASON FOR VISIT: post-op  HPI: Jacob [name] is a 67 y.o. male who presents for follow-up status post right brachiocephalic AV fistula creation and superficialization on 07/28/2016. He had a poorly functioning right radial cephalic fistula that was also ligated at that time. He is currently dialyzing via a right IJ tunneled dialysis catheter. He dialyzes on Mondays, Wednesdays and Fridays. He does note some intermittent numbness to his right fingers. This was present with his right radial cephalic fistula.  This is tolerable.  Current Outpatient Prescriptions  Medication Sig Dispense Refill  . acetaminophen (TYLENOL) 500 MG tablet Take 1,000 mg by mouth every 6 (six) hours as needed for mild pain.    [name] aspirin EC 325 MG tablet Take 325 mg by mouth daily.    . B Complex-C-Folic Acid (DIALYVITE TABLET) TABS Take 1 tablet by mouth every Monday, Wednesday, and Friday.     . carvedilol (COREG) 6.25 MG tablet Take 6.25 mg by mouth 2 (two) times daily with a meal. *Not taken on dialysis days, Mondays, Wednesdays, and Fridays    . cinacalcet (SENSIPAR) 30 MG tablet Take 30 mg by mouth daily.    [name] EPIPEN 2-PAK 0.3 MG/0.3ML SOAJ injection Inject 0.3 mg into the muscle once as needed (For anaphylaxis.).     [name] etodolac (LODINE) 400 MG tablet Take 400 mg by mouth 2 (two) times daily as needed for mild pain or moderate pain.    [name] LANTUS SOLOSTAR 100 UNIT/ML Solostar Pen Inject 40 Units into the skin at bedtime.     [name] omeprazole (PRILOSEC) 40 MG capsule Take 40 mg by mouth daily as needed (for acid reflux).     . ramipril (ALTACE) 10 MG capsule Take 10 mg by mouth daily.     [name] RENVELA 800 MG tablet Take 800-2,400 mg by mouth 3 (three) times daily with meals. Take 2400 mg by mouth 3 times daily with meals and 800 mg by mouth with snacks    . zolpidem (AMBIEN) 10 MG tablet Take 10 mg by mouth at bedtime as needed for sleep.     [name]  oxyCODONE-acetaminophen (PERCOCET/ROXICET) 5-325 MG tablet Take 1 tablet by mouth every 6 (six) hours as needed. (Patient not taking: Reported on 08/04/2016) 15 tablet 0   No current facility-administered medications for this visit.     REVIEW OF SYSTEMS:  [X]  denotes positive finding, [ ]  denotes negative finding Cardiac  Comments:  Chest pain or chest pressure:    Shortness of breath upon exertion:    Short of breath when lying flat:    Irregular heart rhythm:    Constitutional    Fever or chills:      PHYSICAL EXAM: Vitals:   09/02/16 1512 09/02/16 1514  BP: (!) 190/74 (!) 192/77  Pulse: 75   Resp: 20   Temp: 98 F (36.7 C)   TempSrc: Oral   SpO2: 96%   Weight: 300 lb (136.1 kg)   Height: 6\' 2"  (1.88 m)     GENERAL: The patient is a well-nourished male, in no acute distress. The vital signs are documented above. VASCULAR: Right upper arm incisions well-healed. Palpable thrill from antecubital fossa to mid upper arm. The fistula is more difficult to palpate at upper third of arm.   DATA:  Dialysis duplex 08/28/16  Fistula is 0.95 cm at antecubital fossa, 0.48 cm at the distal upper arm and 0.58 cm at the  mid upper arm.  Depths are 0.62 cm at the antecubital fossa, 0.48 cm the distal upper arm and 0.94 cm at the mid upper arm.  MEDICAL ISSUES: ESRD on HD  The patient's right upper arm fistula is not yet mature. Dr. Scot Dock also evaluated patient. The upper third of the fistula is deeper but a thrill can be appreciated. Dr. Scot Dock said that he has adequate room for cannulation once his fistula matures and will not need additional superficialization. The patient is currently dialyzing via a right IJ tunneled dialysis catheter. He will follow-up in 6 weeks with a repeat dialysis access duplex.  [name], PA-C [facility] MD: Scot Dock

## 2016-09-03 DIAGNOSIS — D631 Anemia in chronic kidney disease: Secondary | ICD-10-CM | POA: Diagnosis not present

## 2016-09-03 DIAGNOSIS — N186 End stage renal disease: Secondary | ICD-10-CM | POA: Diagnosis not present

## 2016-09-03 DIAGNOSIS — D509 Iron deficiency anemia, unspecified: Secondary | ICD-10-CM | POA: Diagnosis not present

## 2016-09-03 DIAGNOSIS — Z992 Dependence on renal dialysis: Secondary | ICD-10-CM | POA: Diagnosis not present

## 2016-09-03 DIAGNOSIS — N2581 Secondary hyperparathyroidism of renal origin: Secondary | ICD-10-CM | POA: Diagnosis not present

## 2016-09-04 DIAGNOSIS — Z992 Dependence on renal dialysis: Secondary | ICD-10-CM | POA: Diagnosis not present

## 2016-09-04 DIAGNOSIS — N2581 Secondary hyperparathyroidism of renal origin: Secondary | ICD-10-CM | POA: Diagnosis not present

## 2016-09-04 DIAGNOSIS — D509 Iron deficiency anemia, unspecified: Secondary | ICD-10-CM | POA: Diagnosis not present

## 2016-09-04 DIAGNOSIS — D631 Anemia in chronic kidney disease: Secondary | ICD-10-CM | POA: Diagnosis not present

## 2016-09-04 DIAGNOSIS — N186 End stage renal disease: Secondary | ICD-10-CM | POA: Diagnosis not present

## 2016-09-04 NOTE — Addendum Note (Signed)
Addended by: Lianne Cure A on: 09/04/2016 04:53 PM   Modules accepted: Orders

## 2016-09-07 DIAGNOSIS — N186 End stage renal disease: Secondary | ICD-10-CM | POA: Diagnosis not present

## 2016-09-07 DIAGNOSIS — D631 Anemia in chronic kidney disease: Secondary | ICD-10-CM | POA: Diagnosis not present

## 2016-09-07 DIAGNOSIS — Z992 Dependence on renal dialysis: Secondary | ICD-10-CM | POA: Diagnosis not present

## 2016-09-07 DIAGNOSIS — D509 Iron deficiency anemia, unspecified: Secondary | ICD-10-CM | POA: Diagnosis not present

## 2016-09-07 DIAGNOSIS — N2581 Secondary hyperparathyroidism of renal origin: Secondary | ICD-10-CM | POA: Diagnosis not present

## 2016-09-09 DIAGNOSIS — D631 Anemia in chronic kidney disease: Secondary | ICD-10-CM | POA: Diagnosis not present

## 2016-09-09 DIAGNOSIS — N186 End stage renal disease: Secondary | ICD-10-CM | POA: Diagnosis not present

## 2016-09-09 DIAGNOSIS — D509 Iron deficiency anemia, unspecified: Secondary | ICD-10-CM | POA: Diagnosis not present

## 2016-09-09 DIAGNOSIS — N2581 Secondary hyperparathyroidism of renal origin: Secondary | ICD-10-CM | POA: Diagnosis not present

## 2016-09-09 DIAGNOSIS — Z992 Dependence on renal dialysis: Secondary | ICD-10-CM | POA: Diagnosis not present

## 2016-09-11 DIAGNOSIS — N186 End stage renal disease: Secondary | ICD-10-CM | POA: Diagnosis not present

## 2016-09-11 DIAGNOSIS — D631 Anemia in chronic kidney disease: Secondary | ICD-10-CM | POA: Diagnosis not present

## 2016-09-11 DIAGNOSIS — N2581 Secondary hyperparathyroidism of renal origin: Secondary | ICD-10-CM | POA: Diagnosis not present

## 2016-09-11 DIAGNOSIS — D509 Iron deficiency anemia, unspecified: Secondary | ICD-10-CM | POA: Diagnosis not present

## 2016-09-11 DIAGNOSIS — Z992 Dependence on renal dialysis: Secondary | ICD-10-CM | POA: Diagnosis not present

## 2016-09-14 DIAGNOSIS — N2581 Secondary hyperparathyroidism of renal origin: Secondary | ICD-10-CM | POA: Diagnosis not present

## 2016-09-14 DIAGNOSIS — N186 End stage renal disease: Secondary | ICD-10-CM | POA: Diagnosis not present

## 2016-09-14 DIAGNOSIS — Z992 Dependence on renal dialysis: Secondary | ICD-10-CM | POA: Diagnosis not present

## 2016-09-14 DIAGNOSIS — D631 Anemia in chronic kidney disease: Secondary | ICD-10-CM | POA: Diagnosis not present

## 2016-09-14 DIAGNOSIS — D509 Iron deficiency anemia, unspecified: Secondary | ICD-10-CM | POA: Diagnosis not present

## 2016-09-14 NOTE — Progress Notes (Signed)
Cardiology Office Note  Date: 09/15/2016   ID: Jacob [name], DOB 1951/06/13, MRN [id number]  PCP: Purvis Kilts, MD  Primary Cardiologist: Rozann Lesches, MD   Chief Complaint  Patient presents with  . History of cardiomyopathy    History of Present Illness: Jacob [name] is a 66 y.o. male last seen in February 2017. He presents for a routine follow-up visit. Reports no major change in stamina or chronic dyspnea on exertion since last encounter. He has had no angina symptoms. He continues to dialyze on Mondays, Wednesdays, and Fridays, follows with Dr. Lowanda Foster for ESRD.  He follows with [facility], status post right brachiocephalic AV fistula creation and superficialization on 07/28/2016. Reports no recent access issues.  Last echocardiogram in August 2016 revealed LVEF 55-60% range. We went over these results today. He has had no change in his cardiac regimen since that time, remains on Coreg and Altace.  Past Medical History:  Diagnosis Date  . Anemia in chronic kidney disease(285.21)   . Arthritis   . End stage renal disease ([place])    Hemodialysis on Monday, Wednesday, Friday- in [place]  . Essential hypertension   . GERD (gastroesophageal reflux disease)   . Glaucoma   . History of blood transfusion   . Insomnia   . Nonischemic cardiomyopathy ([place])   . Stroke ([place]) 2016  . Type 2 diabetes mellitus ([place])     Past Surgical History:  Procedure Laterality Date  . AV FISTULA PLACEMENT     Hx: of  . AV FISTULA PLACEMENT Right 05/04/2013   Procedure: ARTERIOVENOUS (AV) FISTULA CREATION- RIGHT ARM;  Surgeon: Conrad Dayton, MD;  Location: [place];  Service: Vascular;  Laterality: Right;  Ultrasound guided  . AV FISTULA PLACEMENT Right 07/28/2016   Procedure: BRACHIOCEPHALIC ARTERIOVENOUS (AV) FISTULA CREATION;  Surgeon: [name], MD;  Location: [place];  Service: Vascular;  Laterality: Right;  . CATARACT EXTRACTION W/PHACO Left 07/16/2014   Procedure: CATARACT  EXTRACTION PHACO AND INTRAOCULAR LENS PLACEMENT (IOC);  Surgeon: [name], MD;  Location: [facility];  Service: Ophthalmology;  Laterality: Left;  CDE:8.86  . CATARACT EXTRACTION W/PHACO Right 07/30/2014   Procedure: CATARACT EXTRACTION PHACO AND INTRAOCULAR LENS PLACEMENT (IOC);  Surgeon: [name], MD;  Location: [facility];  Service: Ophthalmology;  Laterality: Right;  CDE 8.99  . COLONOSCOPY     Hx: of  . FISTULA SUPERFICIALIZATION Right 07/28/2016   Procedure: FISTULA SUPERFICIALIZATION;  Surgeon: [name], MD;  Location: [place];  Service: Vascular;  Laterality: Right;  . FISTULOGRAM N/A 05/04/2013   Procedure: CENTRAL VENOGRAM;  Surgeon: Conrad [place], MD;  Location: [place];  Service: Vascular;  Laterality: N/A;  . INSERTION OF DIALYSIS CATHETER Right 05/04/2013   Procedure: INSERTION OF DIALYSIS CATHETER;  Surgeon: Conrad [place], MD;  Location: [place];  Service: Vascular;  Laterality: Right;  Ultrasound guided  . INSERTION OF DIALYSIS CATHETER N/A 07/28/2016   Procedure: INSERTION OF DIALYSIS CATHETER;  Surgeon: [name], MD;  Location: [place];  Service: Vascular;  Laterality: N/A;  . LIGATION OF ARTERIOVENOUS  FISTULA Left 05/04/2013   Procedure: LIGATION OF LEFT RADIAL CEPHALIC ARTERIOVENOUS  FISTULA;  Surgeon: Conrad [place], MD;  Location: [place];  Service: Vascular;  Laterality: Left;  Ultrasound guided  . LIGATION OF ARTERIOVENOUS  FISTULA Right 07/28/2016   Procedure: LIGATION OF RADIOCEPHALIC ARTERIOVENOUS  FISTULA;  Surgeon: [name], MD;  Location: [place];  Service: Vascular;  Laterality: Right;  .  LUMBAR SPINE SURGERY    . PERIPHERAL VASCULAR CATHETERIZATION N/A 01/24/2015   Procedure: Fistulagram;  Surgeon: Conrad [place], MD;  Location: [place] CV LAB;  Service: Cardiovascular;  Laterality: N/A;  . PERIPHERAL VASCULAR CATHETERIZATION Right 06/04/2016   Procedure: A/V Shuntogram/Fistulagram;  Surgeon: Conrad Grundy, MD;  Location: [place] CV LAB;   Service: Cardiovascular;  Laterality: Right;  . REVISON OF ARTERIOVENOUS FISTULA Right 01/02/2014   Procedure: REVISON OF ARTERIOVENOUS FISTULA ANASTOMOSIS;  Surgeon: Conrad [place]ena, MD;  Location: [place];  Service: Vascular;  Laterality: Right;  . REVISON OF ARTERIOVENOUS FISTULA Right 01/02/2016   Procedure: REVISION OF RADIOCEPHALIC ARTERIOVENOUS FISTULA  with BOVINE PATCH ANGIOPLASTY RIGHT RADIAL ARTERY;  Surgeon: Mal [name], MD;  Location: [place];  Service: Vascular;  Laterality: Right;  . SHUNTOGRAM N/A 11/07/2013   Procedure: Fistulogram;  Surgeon: [name], MD;  Location: [facility];  Service: Cardiovascular;  Laterality: N/A;    Current Outpatient Prescriptions  Medication Sig Dispense Refill  . acetaminophen (TYLENOL) 500 MG tablet Take 1,000 mg by mouth every 6 (six) hours as needed for mild pain.    [name] aspirin EC 325 MG tablet Take 325 mg by mouth daily.    . B Complex-C-Folic Acid (DIALYVITE TABLET) TABS Take 1 tablet by mouth every Monday, Wednesday, and Friday.     . carvedilol (COREG) 6.25 MG tablet Take 6.25 mg by mouth 2 (two) times daily with a meal. *Not taken on dialysis days, Mondays, Wednesdays, and Fridays    . cinacalcet (SENSIPAR) 30 MG tablet Take 30 mg by mouth daily.    [name] EPIPEN 2-PAK 0.3 MG/0.3ML SOAJ injection Inject 0.3 mg into the muscle once as needed (For anaphylaxis.).     [name] etodolac (LODINE) 400 MG tablet Take 400 mg by mouth 2 (two) times daily as needed for mild pain or moderate pain.    [name] LANTUS SOLOSTAR 100 UNIT/ML Solostar Pen Inject 40 Units into the skin at bedtime.     [name] omeprazole (PRILOSEC) 40 MG capsule Take 40 mg by mouth daily as needed (for acid reflux).     [name] oxyCODONE-acetaminophen (PERCOCET/ROXICET) 5-325 MG tablet Take 1 tablet by mouth every 6 (six) hours as needed. 15 tablet 0  . ramipril (ALTACE) 10 MG capsule Take 10 mg by mouth daily.     [name] RENVELA 800 MG tablet Take 800-2,400 mg by mouth 3 (three) times daily with meals. Take 2400 mg  by mouth 3 times daily with meals and 800 mg by mouth with snacks    . zolpidem (AMBIEN) 10 MG tablet Take 10 mg by mouth at bedtime as needed for sleep.      No current facility-administered medications for this visit.    Allergies:  Bee venom and Penicillins   Social History: The patient  reports that he has never smoked. He has never used smokeless tobacco. He reports that he drinks alcohol. He reports that he does not use drugs.   ROS:  Please see the history of present illness. Otherwise, complete review of systems is positive for chronic arthritic pains and stiffness, uses a cane.  All other systems are reviewed and negative.   Physical Exam: VS:  BP 124/90   Pulse 74   Ht 6\' 2"  (1.88 m)   Wt 300 lb (136.1 kg)   SpO2 97%   BMI 38.52 kg/m , BMI Body mass index is 38.52 kg/m.  Wt Readings from Last 3 Encounters:  09/15/16 300  lb (136.1 kg)  09/02/16 300 lb (136.1 kg)  08/04/16 (!) 305 lb (138.3 kg)    Obese male in no acute distress.  HEENT: Conjunctiva and lids normal, oropharynx with poor dentition.  Neck: Supple, no elevated JVP, possible left carotid bruit. Lungs: Decreased but clear overall, nonlabored.  Cardiac: Indistinct PMI, regular rate and rhythm, no S3, soft systolic murmur.  Abdomen: Nontender, bowel sounds present, no hepatomegaly.  Musculoskeletal: No kyphosis.  Extremities: No distal pitting edema. AV fistula noted in the left forearm. Skin: Warm and dry. Musculoskeletal: No kyphosis. Neuropsychiatric: Alert and oriented 3, affect appropriate.  ECG: I personally reviewed the tracing from 04/05/2016 which showed sinus rhythm with prolonged PR interval and IVCD.  Recent Labwork: 07/29/2016: BUN 38; Creatinine, Ser 13.84; Hemoglobin 10.4; Platelets 337; Potassium 4.7; Sodium 134   Other Studies Reviewed Today:  Echocardiogram 03/07/2015: Study Conclusions  - Procedure narrative: Transthoracic echocardiography. Image   quality was adequate. The  study was technically difficult, as a   result of poor sound wave transmission. Intravenous contrast   (Definity) was administered. - Left ventricle: Systolic function was normal. The estimated   ejection fraction was in the range of 55% to 60%. - Mitral valve: Mildly calcified annulus. Mildly thickened leaflets  Assessment and Plan:  1. History of nonischemic cardiomyopathy with normalization of LVEF by last echocardiogram in 2016, 55-60% range. He has had no significant change in stamina or chronic dyspnea exertion since that point. Remains on Coreg and Altace. We will continue observation for now.  2. ESRD on hemodialysis, follows with Dr. Lowanda Foster.  3. Essential hypertension, blood pressure control better in general today. No changes in current regimen.  4. Prior history of stroke, some residual right arm weakness. He continues on aspirin.  Current medicines were reviewed with the patient today.   Disposition: Follow-up in one year, sooner if needed.  Signed, Satira Sark, MD, [facility] 09/15/2016 9:10 AM    [place] [facility] [place], [place], [place] [address] Phone: [phone]; Fax: [phone]

## 2016-09-15 ENCOUNTER — Ambulatory Visit (INDEPENDENT_AMBULATORY_CARE_PROVIDER_SITE_OTHER): Payer: Medicare Other | Admitting: Cardiology

## 2016-09-15 ENCOUNTER — Encounter: Payer: Self-pay | Admitting: Cardiology

## 2016-09-15 VITALS — BP 124/90 | HR 74 | Ht 74.0 in | Wt 300.0 lb

## 2016-09-15 DIAGNOSIS — N186 End stage renal disease: Secondary | ICD-10-CM | POA: Diagnosis not present

## 2016-09-15 DIAGNOSIS — I10 Essential (primary) hypertension: Secondary | ICD-10-CM | POA: Diagnosis not present

## 2016-09-15 DIAGNOSIS — I428 Other cardiomyopathies: Secondary | ICD-10-CM

## 2016-09-15 DIAGNOSIS — Z992 Dependence on renal dialysis: Secondary | ICD-10-CM

## 2016-09-15 DIAGNOSIS — Z8673 Personal history of transient ischemic attack (TIA), and cerebral infarction without residual deficits: Secondary | ICD-10-CM

## 2016-09-15 NOTE — Patient Instructions (Signed)
Your physician wants you to follow-up in: 1 year with Dr. McDowell You will receive a reminder letter in the mail two months in advance. If you don't receive a letter, please call our office to schedule the follow-up appointment.  Your physician recommends that you continue on your current medications as directed. Please refer to the Current Medication list given to you today.  Thank you for choosing [facility]!!   

## 2016-09-16 DIAGNOSIS — D509 Iron deficiency anemia, unspecified: Secondary | ICD-10-CM | POA: Diagnosis not present

## 2016-09-16 DIAGNOSIS — Z992 Dependence on renal dialysis: Secondary | ICD-10-CM | POA: Diagnosis not present

## 2016-09-16 DIAGNOSIS — D631 Anemia in chronic kidney disease: Secondary | ICD-10-CM | POA: Diagnosis not present

## 2016-09-16 DIAGNOSIS — N186 End stage renal disease: Secondary | ICD-10-CM | POA: Diagnosis not present

## 2016-09-16 DIAGNOSIS — N2581 Secondary hyperparathyroidism of renal origin: Secondary | ICD-10-CM | POA: Diagnosis not present

## 2016-09-18 DIAGNOSIS — D509 Iron deficiency anemia, unspecified: Secondary | ICD-10-CM | POA: Diagnosis not present

## 2016-09-18 DIAGNOSIS — N186 End stage renal disease: Secondary | ICD-10-CM | POA: Diagnosis not present

## 2016-09-18 DIAGNOSIS — Z992 Dependence on renal dialysis: Secondary | ICD-10-CM | POA: Diagnosis not present

## 2016-09-18 DIAGNOSIS — D631 Anemia in chronic kidney disease: Secondary | ICD-10-CM | POA: Diagnosis not present

## 2016-09-18 DIAGNOSIS — N2581 Secondary hyperparathyroidism of renal origin: Secondary | ICD-10-CM | POA: Diagnosis not present

## 2016-09-21 DIAGNOSIS — Z992 Dependence on renal dialysis: Secondary | ICD-10-CM | POA: Diagnosis not present

## 2016-09-21 DIAGNOSIS — N186 End stage renal disease: Secondary | ICD-10-CM | POA: Diagnosis not present

## 2016-09-21 DIAGNOSIS — D509 Iron deficiency anemia, unspecified: Secondary | ICD-10-CM | POA: Diagnosis not present

## 2016-09-21 DIAGNOSIS — D631 Anemia in chronic kidney disease: Secondary | ICD-10-CM | POA: Diagnosis not present

## 2016-09-21 DIAGNOSIS — N2581 Secondary hyperparathyroidism of renal origin: Secondary | ICD-10-CM | POA: Diagnosis not present

## 2016-09-23 ENCOUNTER — Encounter: Payer: Self-pay | Admitting: Vascular Surgery

## 2016-09-23 DIAGNOSIS — N186 End stage renal disease: Secondary | ICD-10-CM | POA: Diagnosis not present

## 2016-09-23 DIAGNOSIS — Z992 Dependence on renal dialysis: Secondary | ICD-10-CM | POA: Diagnosis not present

## 2016-09-23 DIAGNOSIS — N2581 Secondary hyperparathyroidism of renal origin: Secondary | ICD-10-CM | POA: Diagnosis not present

## 2016-09-23 DIAGNOSIS — D509 Iron deficiency anemia, unspecified: Secondary | ICD-10-CM | POA: Diagnosis not present

## 2016-09-23 DIAGNOSIS — D631 Anemia in chronic kidney disease: Secondary | ICD-10-CM | POA: Diagnosis not present

## 2016-09-25 DIAGNOSIS — D509 Iron deficiency anemia, unspecified: Secondary | ICD-10-CM | POA: Diagnosis not present

## 2016-09-25 DIAGNOSIS — N2581 Secondary hyperparathyroidism of renal origin: Secondary | ICD-10-CM | POA: Diagnosis not present

## 2016-09-25 DIAGNOSIS — Z992 Dependence on renal dialysis: Secondary | ICD-10-CM | POA: Diagnosis not present

## 2016-09-25 DIAGNOSIS — D631 Anemia in chronic kidney disease: Secondary | ICD-10-CM | POA: Diagnosis not present

## 2016-09-25 DIAGNOSIS — N186 End stage renal disease: Secondary | ICD-10-CM | POA: Diagnosis not present

## 2016-09-28 DIAGNOSIS — Z992 Dependence on renal dialysis: Secondary | ICD-10-CM | POA: Diagnosis not present

## 2016-09-28 DIAGNOSIS — D631 Anemia in chronic kidney disease: Secondary | ICD-10-CM | POA: Diagnosis not present

## 2016-09-28 DIAGNOSIS — N186 End stage renal disease: Secondary | ICD-10-CM | POA: Diagnosis not present

## 2016-09-28 DIAGNOSIS — N2581 Secondary hyperparathyroidism of renal origin: Secondary | ICD-10-CM | POA: Diagnosis not present

## 2016-09-28 DIAGNOSIS — D509 Iron deficiency anemia, unspecified: Secondary | ICD-10-CM | POA: Diagnosis not present

## 2016-09-30 ENCOUNTER — Ambulatory Visit (HOSPITAL_COMMUNITY)
Admission: RE | Admit: 2016-09-30 | Discharge: 2016-09-30 | Disposition: A | Discharge status: Home / Self Care | Payer: Medicare Other | Source: Ambulatory Visit | Attending: Vascular Surgery | Admitting: Vascular Surgery

## 2016-09-30 ENCOUNTER — Ambulatory Visit (INDEPENDENT_AMBULATORY_CARE_PROVIDER_SITE_OTHER): Payer: Self-pay | Admitting: Vascular Surgery

## 2016-09-30 VITALS — BP 188/91 | HR 68 | Temp 97.9°F | Resp 20 | Ht 74.0 in | Wt 300.0 lb

## 2016-09-30 DIAGNOSIS — D631 Anemia in chronic kidney disease: Secondary | ICD-10-CM | POA: Diagnosis not present

## 2016-09-30 DIAGNOSIS — N2581 Secondary hyperparathyroidism of renal origin: Secondary | ICD-10-CM | POA: Diagnosis not present

## 2016-09-30 DIAGNOSIS — N186 End stage renal disease: Secondary | ICD-10-CM

## 2016-09-30 DIAGNOSIS — D509 Iron deficiency anemia, unspecified: Secondary | ICD-10-CM | POA: Diagnosis not present

## 2016-09-30 DIAGNOSIS — Z992 Dependence on renal dialysis: Secondary | ICD-10-CM | POA: Diagnosis not present

## 2016-09-30 NOTE — Progress Notes (Signed)
Patient name: TREYVONNE [name] MRN: [id number] DOB: 08-19-1950 Sex: male  REASON FOR VISIT: follow-up  HPI: Jacob [name] is a 66 y.o. male who underwent right brachiocephalic AV fistula creation and superficialization on 07/28/2016. He also underwent ligation of his right radial cephalic fistula at that time (due to poor function).  He was last seen in our office on 09/02/16. At that time, his fistula was not yet mature. He was advised to follow up in one month with repeat duplex. He currently dialyzes via a right IJ tunneled dialysis catheter on Mondays, Wednesdays and Fridays. He has baseline numbness in his hands bilaterally. He denies any pain in hands.    Current Outpatient Prescriptions  Medication Sig Dispense Refill  . acetaminophen (TYLENOL) 500 MG tablet Take 1,000 mg by mouth every 6 (six) hours as needed for mild pain.    [name] aspirin EC 325 MG tablet Take 325 mg by mouth daily.    . B Complex-C-Folic Acid (DIALYVITE TABLET) TABS Take 1 tablet by mouth every Monday, Wednesday, and Friday.     . carvedilol (COREG) 6.25 MG tablet Take 6.25 mg by mouth 2 (two) times daily with a meal. *Not taken on dialysis days, Mondays, Wednesdays, and Fridays    . cinacalcet (SENSIPAR) 30 MG tablet Take 30 mg by mouth daily.    [name] EPIPEN 2-PAK 0.3 MG/0.3ML SOAJ injection Inject 0.3 mg into the muscle once as needed (For anaphylaxis.).     [name] etodolac (LODINE) 400 MG tablet Take 400 mg by mouth 2 (two) times daily as needed for mild pain or moderate pain.    [name] LANTUS SOLOSTAR 100 UNIT/ML Solostar Pen Inject 40 Units into the skin at bedtime.     [name] omeprazole (PRILOSEC) 40 MG capsule Take 40 mg by mouth daily as needed (for acid reflux).     [name] oxyCODONE-acetaminophen (PERCOCET/ROXICET) 5-325 MG tablet Take 1 tablet by mouth every 6 (six) hours as needed. 15 tablet 0  . ramipril (ALTACE) 10 MG capsule Take 10 mg by mouth daily.     [name] RENVELA 800 MG tablet Take 800-2,400 mg by mouth 3 (three) times daily with  meals. Take 2400 mg by mouth 3 times daily with meals and 800 mg by mouth with snacks    . zolpidem (AMBIEN) 10 MG tablet Take 10 mg by mouth at bedtime as needed for sleep.      No current facility-administered medications for this visit.     REVIEW OF SYSTEMS:  [X]  denotes positive finding, [ ]  denotes negative finding Cardiac  Comments:  Chest pain or chest pressure:    Shortness of breath upon exertion:    Short of breath when lying flat:    Irregular heart rhythm:    Constitutional    Fever or chills:      PHYSICAL EXAM: Vitals:   09/30/16 1550 09/30/16 1555  BP: (!) 186/86 (!) 188/91  Pulse: 68   Resp: 20   Temp: 97.9 F (36.6 C)   TempSrc: Oral   SpO2: 96%   Weight: 300 lb (136.1 kg)   Height: 6\' 2"  (1.88 m)     GENERAL: The patient is a well-nourished male, in no acute distress. The vital signs are documented above. VASCULAR: Right upper arm fistula with palpable thrill. Palpable thrill at wrist.   DATA:  Dialysis access duplex 09/30/16 0.82 cm at distal brachium, 0.49 cm at mid upper arm, 0.56 cm at proximal upper arm.   Depth 0.68 cm  distal brachium, 1 cm mid upper arm and 1.5 cm proximal upper arm.   MEDICAL ISSUES: ESRD on HD (MWF) Slowly maturing right brachiocephalic fistula (79/53/69)  Essentially no changes in diameter since last duplex one month ago. Discussed with Dr. Scot Dock who recommend fistulogram. He currently is dialyzing via a right IJ TDC. Will schedule right arm fistulogram on non dialysis day.    Jacob Jock, PA-C [facility] MD: Scot Dock

## 2016-10-01 ENCOUNTER — Other Ambulatory Visit: Payer: Self-pay

## 2016-10-01 DIAGNOSIS — Z992 Dependence on renal dialysis: Secondary | ICD-10-CM | POA: Diagnosis not present

## 2016-10-01 DIAGNOSIS — D509 Iron deficiency anemia, unspecified: Secondary | ICD-10-CM | POA: Diagnosis not present

## 2016-10-01 DIAGNOSIS — N186 End stage renal disease: Secondary | ICD-10-CM | POA: Diagnosis not present

## 2016-10-01 DIAGNOSIS — N2581 Secondary hyperparathyroidism of renal origin: Secondary | ICD-10-CM | POA: Diagnosis not present

## 2016-10-02 DIAGNOSIS — N2581 Secondary hyperparathyroidism of renal origin: Secondary | ICD-10-CM | POA: Diagnosis not present

## 2016-10-02 DIAGNOSIS — N186 End stage renal disease: Secondary | ICD-10-CM | POA: Diagnosis not present

## 2016-10-02 DIAGNOSIS — D509 Iron deficiency anemia, unspecified: Secondary | ICD-10-CM | POA: Diagnosis not present

## 2016-10-02 DIAGNOSIS — Z992 Dependence on renal dialysis: Secondary | ICD-10-CM | POA: Diagnosis not present

## 2016-10-05 DIAGNOSIS — D509 Iron deficiency anemia, unspecified: Secondary | ICD-10-CM | POA: Diagnosis not present

## 2016-10-05 DIAGNOSIS — N186 End stage renal disease: Secondary | ICD-10-CM | POA: Diagnosis not present

## 2016-10-05 DIAGNOSIS — Z992 Dependence on renal dialysis: Secondary | ICD-10-CM | POA: Diagnosis not present

## 2016-10-05 DIAGNOSIS — N2581 Secondary hyperparathyroidism of renal origin: Secondary | ICD-10-CM | POA: Diagnosis not present

## 2016-10-06 ENCOUNTER — Ambulatory Visit (HOSPITAL_COMMUNITY)
Admission: RE | Admit: 2016-10-06 | Discharge: 2016-10-06 | Disposition: A | Discharge status: Home / Self Care | Payer: Medicare Other | Source: Ambulatory Visit | Attending: Surgery | Admitting: Surgery

## 2016-10-06 ENCOUNTER — Encounter (HOSPITAL_COMMUNITY): Payer: Self-pay | Admitting: *Deleted

## 2016-10-06 ENCOUNTER — Encounter (HOSPITAL_COMMUNITY)
Admission: RE | Disposition: A | Discharge status: Home / Self Care | Payer: Self-pay | Source: Ambulatory Visit | Attending: Surgery

## 2016-10-06 DIAGNOSIS — T82898A Other specified complication of vascular prosthetic devices, implants and grafts, initial encounter: Secondary | ICD-10-CM | POA: Diagnosis not present

## 2016-10-06 DIAGNOSIS — N186 End stage renal disease: Secondary | ICD-10-CM | POA: Insufficient documentation

## 2016-10-06 DIAGNOSIS — Z992 Dependence on renal dialysis: Secondary | ICD-10-CM | POA: Insufficient documentation

## 2016-10-06 DIAGNOSIS — Z7982 Long term (current) use of aspirin: Secondary | ICD-10-CM | POA: Insufficient documentation

## 2016-10-06 DIAGNOSIS — Y832 Surgical operation with anastomosis, bypass or graft as the cause of abnormal reaction of the patient, or of later complication, without mention of misadventure at the time of the procedure: Secondary | ICD-10-CM | POA: Diagnosis not present

## 2016-10-06 DIAGNOSIS — R200 Anesthesia of skin: Secondary | ICD-10-CM | POA: Insufficient documentation

## 2016-10-06 HISTORY — PX: A/V FISTULAGRAM: CATH118298

## 2016-10-06 LAB — POCT I-STAT, CHEM 8
BUN: 35 mg/dL — ABNORMAL HIGH (ref 6–20)
Calcium, Ion: 1.14 mmol/L — ABNORMAL LOW (ref 1.15–1.40)
Chloride: 99 mmol/L — ABNORMAL LOW (ref 101–111)
Creatinine, Ser: 8.7 mg/dL — ABNORMAL HIGH (ref 0.61–1.24)
Glucose, Bld: 155 mg/dL — ABNORMAL HIGH (ref 65–99)
HEMATOCRIT: 43 % (ref 39.0–52.0)
HEMOGLOBIN: 14.6 g/dL (ref 13.0–17.0)
Potassium: 3.6 mmol/L (ref 3.5–5.1)
SODIUM: 140 mmol/L (ref 135–145)
TCO2: 30 mmol/L (ref 0–100)

## 2016-10-06 LAB — GLUCOSE, CAPILLARY: Glucose-Capillary: 131 mg/dL — ABNORMAL HIGH (ref 65–99)

## 2016-10-06 SURGERY — A/V FISTULAGRAM
Anesthesia: LOCAL | Laterality: Right

## 2016-10-06 MED ORDER — HYDRALAZINE HCL 20 MG/ML IJ SOLN: 5.0000 mg | INTRAMUSCULAR | Status: DC | PRN

## 2016-10-06 MED ORDER — LABETALOL HCL 5 MG/ML IV SOLN: 10.0000 mg | INTRAVENOUS | Status: DC | PRN

## 2016-10-06 MED ORDER — LIDOCAINE HCL (PF) 1 % IJ SOLN
INTRAMUSCULAR | Status: AC
  Filled 2016-10-06: qty 30

## 2016-10-06 MED ORDER — DOCUSATE SODIUM 100 MG PO CAPS: 100.0000 mg | ORAL_CAPSULE | Freq: Every day | ORAL | Status: DC

## 2016-10-06 MED ORDER — METOPROLOL TARTRATE 5 MG/5ML IV SOLN: 2.0000 mg | INTRAVENOUS | Status: DC | PRN

## 2016-10-06 MED ORDER — PHENOL 1.4 % MT LIQD: 1.0000 | OROMUCOSAL | Status: DC | PRN

## 2016-10-06 MED ORDER — ONDANSETRON HCL 4 MG/2ML IJ SOLN: 4.0000 mg | Freq: Four times a day (QID) | INTRAMUSCULAR | Status: DC | PRN

## 2016-10-06 MED ORDER — LIDOCAINE HCL (PF) 1 % IJ SOLN
INTRAMUSCULAR | Status: DC | PRN
  Administered 2016-10-06: 5 mL via INTRADERMAL

## 2016-10-06 MED ORDER — ALUM & MAG HYDROXIDE-SIMETH 200-200-20 MG/5ML PO SUSP: 15.0000 mL | ORAL | Status: DC | PRN

## 2016-10-06 MED ORDER — IODIXANOL 320 MG/ML IV SOLN
INTRAVENOUS | Status: DC | PRN
  Administered 2016-10-06: 35 mL via INTRAVENOUS

## 2016-10-06 MED ORDER — SODIUM CHLORIDE 0.9% FLUSH: 3.0000 mL | INTRAVENOUS | Status: DC | PRN

## 2016-10-06 MED ORDER — ACETAMINOPHEN 325 MG PO TABS: 325.0000 mg | ORAL_TABLET | ORAL | Status: DC | PRN

## 2016-10-06 MED ORDER — ACETAMINOPHEN 325 MG RE SUPP: 325.0000 mg | RECTAL | Status: DC | PRN

## 2016-10-06 MED ORDER — GUAIFENESIN-DM 100-10 MG/5ML PO SYRP: 15.0000 mL | ORAL_SOLUTION | ORAL | Status: DC | PRN

## 2016-10-06 MED ORDER — HEPARIN (PORCINE) IN NACL 2-0.9 UNIT/ML-% IJ SOLN
INTRAMUSCULAR | Status: DC | PRN
  Administered 2016-10-06: 500 mL

## 2016-10-06 SURGICAL SUPPLY — 10 items
BAG SNAP BAND KOVER 36X36 (MISCELLANEOUS) ×2 IMPLANT
COVER DOME SNAP 22 D (MISCELLANEOUS) ×2 IMPLANT
COVER PRB 48X5XTLSCP FOLD TPE (BAG) ×1 IMPLANT
COVER PROBE 5X48 (BAG) ×2
PROTECTION STATION PRESSURIZED (MISCELLANEOUS) ×2
STATION PROTECTION PRESSURIZED (MISCELLANEOUS) ×1 IMPLANT
STOPCOCK MORSE 400PSI 3WAY (MISCELLANEOUS) ×2 IMPLANT
TRAY PV CATH (CUSTOM PROCEDURE TRAY) ×2 IMPLANT
TUBING CIL FLEX 10 FLL-RA (TUBING) ×2 IMPLANT
WIRE MINI STICK MAX (SHEATH) ×1 IMPLANT

## 2016-10-06 NOTE — Interval H&P Note (Signed)
History and Physical Interval Note:  10/06/2016 11:11 AM  Jacob [name]  has presented today for surgery, with the diagnosis of end stage renal  The various methods of treatment have been discussed with the patient and family. After consideration of risks, benefits and other options for treatment, the patient has consented to  Procedure(s): A/V Fistulagram (Right) as a surgical intervention .  The patient's history has been reviewed, patient examined, no change in status, stable for surgery.  I have reviewed the patient's chart and labs.  Questions were answered to the patient's satisfaction.     Annamarie Major

## 2016-10-06 NOTE — Discharge Instructions (Signed)
Fistulogram, Care After °Refer to this sheet in the next few weeks. These instructions provide you with information on caring for yourself after your procedure. Your health care provider may also give you more specific instructions. Your treatment has been planned according to current medical practices, but problems sometimes occur. Call your health care provider if you have any problems or questions after your procedure. °What can I expect after the procedure? °After your procedure, it is typical to have the following: °· A small amount of discomfort in the area where the catheters were placed. °· A small amount of bruising around the fistula. °· Sleepiness and fatigue. °Follow these instructions at home: °· Rest at home for the day following your procedure. °· Do not drive or operate heavy machinery while taking pain medicine. °· Take medicines only as directed by your health care provider. °· Do not take baths, swim, or use a hot tub until your health care provider approves. You may shower 24 hours after the procedure or as directed by your health care provider. °· There are many different ways to close and cover an incision, including stitches, skin glue, and adhesive strips. Follow your health care provider's instructions on: °¨ Incision care. °¨ Bandage (dressing) changes and removal. °¨ Incision closure removal. °· Monitor your dialysis fistula carefully. °Contact a health care provider if: °· You have drainage, redness, swelling, or pain at your catheter site. °· You have a fever. °· You have chills. °Get help right away if: °· You feel weak. °· You have trouble balancing. °· You have trouble moving your arms or legs. °· You have problems with your speech or vision. °· You can no longer feel a vibration or buzz when you put your fingers over your dialysis fistula. °· The limb that was used for the procedure: °¨ Swells. °¨ Is painful. °¨ Is cold. °¨ Is discolored, such as blue or pale white. °This information  is not intended to replace advice given to you by your health care provider. Make sure you discuss any questions you have with your health care provider. °Document Released: 12/04/2013 Document Revised: 12/26/2015 Document Reviewed: 09/08/2013 °Elsevier Interactive Patient Education © 2017 [employer] ° °

## 2016-10-06 NOTE — Op Note (Addendum)
    Patient name: Jacob [name] MRN: [id number] DOB: October 27, 1950 Sex: male  10/06/2016 Pre-operative Diagnosis: Non-maturing fistula Post-operative diagnosis:  Same Surgeon:  Annamarie Major Procedure Performed:  1.  Ultrasound-guided access, right cephalic vein  2.  Fistulogram    Indications:  The patient is having difficulty with cannulation.  He comes in today for further evaluation  Procedure:  The patient was identified in the holding area and taken to room 8.  The patient was then placed supine on the table and prepped and draped in the usual sterile fashion.  A time out was called.  Ultrasound was used to evaluate the fistula.  The vein was patent and compressible.  A digital ultrasound image was acquired.  The fistula was then accessed under ultrasound guidance using a micropuncture needle.  An 018 wire was then asvanced without resistance and a micropuncture sheath was placed.  Contrast injections were then performed through the sheath.  Findings:  No central venous stenosis was identified.  The arterial venous anastomosis is widely patent.  The remaining portion of the cephalic vein appears to be widely patent.  By ultrasound evaluation the vein appears to be deep.     Intervention:  None  Impression:  #1  no evidence of central venous stenosis  #2  no evidence of arterial venous stenosis  #3  the cephalic vein appears to be of adequate caliber without significant narrowing in the upper arm  #4  consider superficialization of fistula    V. Annamarie Major, M.D. [facility] [place] Office: [phone] Pager:  [phone]

## 2016-10-06 NOTE — H&P (View-Only) (Signed)
Patient name: Jacob [name] MRN: [id number] DOB: 1951/03/06 Sex: male  REASON FOR VISIT: follow-up  HPI: Jacob [name] is a 66 y.o. male who underwent right brachiocephalic AV fistula creation and superficialization on 07/28/2016. He also underwent ligation of his right radial cephalic fistula at that time (due to poor function).  He was last seen in our office on 09/02/16. At that time, his fistula was not yet mature. He was advised to follow up in one month with repeat duplex. He currently dialyzes via a right IJ tunneled dialysis catheter on Mondays, Wednesdays and Fridays. He has baseline numbness in his hands bilaterally. He denies any pain in hands.    Current Outpatient Prescriptions  Medication Sig Dispense Refill  . acetaminophen (TYLENOL) 500 MG tablet Take 1,000 mg by mouth every 6 (six) hours as needed for mild pain.    [name] aspirin EC 325 MG tablet Take 325 mg by mouth daily.    . B Complex-C-Folic Acid (DIALYVITE TABLET) TABS Take 1 tablet by mouth every Monday, Wednesday, and Friday.     . carvedilol (COREG) 6.25 MG tablet Take 6.25 mg by mouth 2 (two) times daily with a meal. *Not taken on dialysis days, Mondays, Wednesdays, and Fridays    . cinacalcet (SENSIPAR) 30 MG tablet Take 30 mg by mouth daily.    [name] EPIPEN 2-PAK 0.3 MG/0.3ML SOAJ injection Inject 0.3 mg into the muscle once as needed (For anaphylaxis.).     [name] etodolac (LODINE) 400 MG tablet Take 400 mg by mouth 2 (two) times daily as needed for mild pain or moderate pain.    [name] LANTUS SOLOSTAR 100 UNIT/ML Solostar Pen Inject 40 Units into the skin at bedtime.     [name] omeprazole (PRILOSEC) 40 MG capsule Take 40 mg by mouth daily as needed (for acid reflux).     [name] oxyCODONE-acetaminophen (PERCOCET/ROXICET) 5-325 MG tablet Take 1 tablet by mouth every 6 (six) hours as needed. 15 tablet 0  . ramipril (ALTACE) 10 MG capsule Take 10 mg by mouth daily.     [name] RENVELA 800 MG tablet Take 800-2,400 mg by mouth 3 (three) times daily with  meals. Take 2400 mg by mouth 3 times daily with meals and 800 mg by mouth with snacks    . zolpidem (AMBIEN) 10 MG tablet Take 10 mg by mouth at bedtime as needed for sleep.      No current facility-administered medications for this visit.     REVIEW OF SYSTEMS:  [X]  denotes positive finding, [ ]  denotes negative finding Cardiac  Comments:  Chest pain or chest pressure:    Shortness of breath upon exertion:    Short of breath when lying flat:    Irregular heart rhythm:    Constitutional    Fever or chills:      PHYSICAL EXAM: Vitals:   09/30/16 1550 09/30/16 1555  BP: (!) 186/86 (!) 188/91  Pulse: 68   Resp: 20   Temp: 97.9 F (36.6 C)   TempSrc: Oral   SpO2: 96%   Weight: 300 lb (136.1 kg)   Height: 6\' 2"  (1.88 m)     GENERAL: The patient is a well-nourished male, in no acute distress. The vital signs are documented above. VASCULAR: Right upper arm fistula with palpable thrill. Palpable thrill at wrist.   DATA:  Dialysis access duplex 09/30/16 0.82 cm at distal brachium, 0.49 cm at mid upper arm, 0.56 cm at proximal upper arm.   Depth 0.68 cm  distal brachium, 1 cm mid upper arm and 1.5 cm proximal upper arm.   MEDICAL ISSUES: ESRD on HD (MWF) Slowly maturing right brachiocephalic fistula (40/35/24)  Essentially no changes in diameter since last duplex one month ago. Discussed with Dr. Scot Dock who recommend fistulogram. He currently is dialyzing via a right IJ TDC. Will schedule right arm fistulogram on non dialysis day.    [name], PA-C [facility] MD: Scot Dock

## 2016-10-07 ENCOUNTER — Other Ambulatory Visit: Payer: Self-pay

## 2016-10-07 DIAGNOSIS — Z992 Dependence on renal dialysis: Secondary | ICD-10-CM | POA: Diagnosis not present

## 2016-10-07 DIAGNOSIS — N186 End stage renal disease: Secondary | ICD-10-CM | POA: Diagnosis not present

## 2016-10-07 DIAGNOSIS — N2581 Secondary hyperparathyroidism of renal origin: Secondary | ICD-10-CM | POA: Diagnosis not present

## 2016-10-07 DIAGNOSIS — D509 Iron deficiency anemia, unspecified: Secondary | ICD-10-CM | POA: Diagnosis not present

## 2016-10-09 ENCOUNTER — Encounter (HOSPITAL_COMMUNITY): Payer: Self-pay | Admitting: *Deleted

## 2016-10-09 DIAGNOSIS — N186 End stage renal disease: Secondary | ICD-10-CM | POA: Diagnosis not present

## 2016-10-09 DIAGNOSIS — D509 Iron deficiency anemia, unspecified: Secondary | ICD-10-CM | POA: Diagnosis not present

## 2016-10-09 DIAGNOSIS — Z992 Dependence on renal dialysis: Secondary | ICD-10-CM | POA: Diagnosis not present

## 2016-10-09 DIAGNOSIS — N2581 Secondary hyperparathyroidism of renal origin: Secondary | ICD-10-CM | POA: Diagnosis not present

## 2016-10-09 NOTE — Progress Notes (Signed)
   10/09/16 1540  OBSTRUCTIVE SLEEP APNEA  Have you ever been diagnosed with sleep apnea through a sleep study? No  Do you snore loudly (loud enough to be heard through closed doors)?  0  Do you often feel tired, fatigued, or sleepy during the daytime (such as falling asleep during driving or talking to someone)? 0  Has anyone observed you stop breathing during your sleep? 0  Do you have, or are you being treated for high blood pressure? 1  BMI more than 35 kg/m2? 1  Age > 50 (1-yes) 1  Neck circumference greater than:Male 16 inches or larger, Male 17inches or larger? 1 (54.5)  Male Gender (Yes=1) 1  Obstructive Sleep Apnea Score 5  Score 5 or greater  Results sent to PCP

## 2016-10-12 DIAGNOSIS — Z992 Dependence on renal dialysis: Secondary | ICD-10-CM | POA: Diagnosis not present

## 2016-10-12 DIAGNOSIS — N2581 Secondary hyperparathyroidism of renal origin: Secondary | ICD-10-CM | POA: Diagnosis not present

## 2016-10-12 DIAGNOSIS — D509 Iron deficiency anemia, unspecified: Secondary | ICD-10-CM | POA: Diagnosis not present

## 2016-10-12 DIAGNOSIS — N186 End stage renal disease: Secondary | ICD-10-CM | POA: Diagnosis not present

## 2016-10-12 MED ORDER — VANCOMYCIN HCL 10 G IV SOLR
1500.0000 mg | INTRAVENOUS | Status: AC
  Administered 2016-10-13: 1500 mg via INTRAVENOUS
  Filled 2016-10-12: qty 1500

## 2016-10-12 MED ORDER — SODIUM CHLORIDE 0.9 % IV SOLN
INTRAVENOUS | Status: DC
  Administered 2016-10-13: 08:00:00 via INTRAVENOUS

## 2016-10-13 ENCOUNTER — Ambulatory Visit (HOSPITAL_COMMUNITY): Payer: Medicare Other | Admitting: Certified Registered Nurse Anesthetist

## 2016-10-13 ENCOUNTER — Encounter (HOSPITAL_COMMUNITY): Payer: Self-pay | Admitting: *Deleted

## 2016-10-13 ENCOUNTER — Telehealth: Payer: Self-pay | Admitting: Vascular Surgery

## 2016-10-13 ENCOUNTER — Ambulatory Visit (HOSPITAL_COMMUNITY)
Admission: RE | Admit: 2016-10-13 | Discharge: 2016-10-13 | Disposition: A | Discharge status: Home / Self Care | Payer: Medicare Other | Source: Ambulatory Visit | Attending: Vascular Surgery | Admitting: Vascular Surgery

## 2016-10-13 ENCOUNTER — Encounter (HOSPITAL_COMMUNITY)
Admission: RE | Disposition: A | Discharge status: Home / Self Care | Payer: Self-pay | Source: Ambulatory Visit | Attending: Vascular Surgery

## 2016-10-13 DIAGNOSIS — E782 Mixed hyperlipidemia: Secondary | ICD-10-CM | POA: Diagnosis not present

## 2016-10-13 DIAGNOSIS — E1122 Type 2 diabetes mellitus with diabetic chronic kidney disease: Secondary | ICD-10-CM | POA: Insufficient documentation

## 2016-10-13 DIAGNOSIS — Z79899 Other long term (current) drug therapy: Secondary | ICD-10-CM | POA: Insufficient documentation

## 2016-10-13 DIAGNOSIS — K219 Gastro-esophageal reflux disease without esophagitis: Secondary | ICD-10-CM | POA: Diagnosis not present

## 2016-10-13 DIAGNOSIS — Y832 Surgical operation with anastomosis, bypass or graft as the cause of abnormal reaction of the patient, or of later complication, without mention of misadventure at the time of the procedure: Secondary | ICD-10-CM | POA: Diagnosis not present

## 2016-10-13 DIAGNOSIS — N186 End stage renal disease: Secondary | ICD-10-CM | POA: Insufficient documentation

## 2016-10-13 DIAGNOSIS — Z8673 Personal history of transient ischemic attack (TIA), and cerebral infarction without residual deficits: Secondary | ICD-10-CM | POA: Diagnosis not present

## 2016-10-13 DIAGNOSIS — Z7982 Long term (current) use of aspirin: Secondary | ICD-10-CM | POA: Insufficient documentation

## 2016-10-13 DIAGNOSIS — T82898A Other specified complication of vascular prosthetic devices, implants and grafts, initial encounter: Secondary | ICD-10-CM | POA: Insufficient documentation

## 2016-10-13 DIAGNOSIS — I120 Hypertensive chronic kidney disease with stage 5 chronic kidney disease or end stage renal disease: Secondary | ICD-10-CM | POA: Insufficient documentation

## 2016-10-13 DIAGNOSIS — Z992 Dependence on renal dialysis: Secondary | ICD-10-CM | POA: Diagnosis not present

## 2016-10-13 DIAGNOSIS — Z794 Long term (current) use of insulin: Secondary | ICD-10-CM | POA: Insufficient documentation

## 2016-10-13 DIAGNOSIS — E119 Type 2 diabetes mellitus without complications: Secondary | ICD-10-CM | POA: Diagnosis not present

## 2016-10-13 HISTORY — PX: FISTULA SUPERFICIALIZATION: SHX6341

## 2016-10-13 HISTORY — DX: Major depressive disorder, single episode, unspecified: F32.9

## 2016-10-13 HISTORY — DX: Depression, unspecified: F32.A

## 2016-10-13 HISTORY — DX: Anxiety disorder, unspecified: F41.9

## 2016-10-13 LAB — GLUCOSE, CAPILLARY: Glucose-Capillary: 83 mg/dL (ref 65–99)

## 2016-10-13 LAB — POCT I-STAT 4, (NA,K, GLUC, HGB,HCT)
Glucose, Bld: 146 mg/dL — ABNORMAL HIGH (ref 65–99)
HCT: 42 % (ref 39.0–52.0)
Hemoglobin: 14.3 g/dL (ref 13.0–17.0)
Potassium: 3.6 mmol/L (ref 3.5–5.1)
SODIUM: 143 mmol/L (ref 135–145)

## 2016-10-13 SURGERY — FISTULA SUPERFICIALIZATION
Anesthesia: General | Site: Arm Upper | Laterality: Right

## 2016-10-13 MED ORDER — HEPARIN SODIUM (PORCINE) 1000 UNIT/ML IJ SOLN
INTRAMUSCULAR | Status: DC | PRN
  Administered 2016-10-13: 8000 [IU] via INTRAVENOUS

## 2016-10-13 MED ORDER — FENTANYL CITRATE (PF) 100 MCG/2ML IJ SOLN
INTRAMUSCULAR | Status: DC | PRN
  Administered 2016-10-13: 50 ug via INTRAVENOUS

## 2016-10-13 MED ORDER — PHENYLEPHRINE 40 MCG/ML (10ML) SYRINGE FOR IV PUSH (FOR BLOOD PRESSURE SUPPORT)
PREFILLED_SYRINGE | INTRAVENOUS | Status: DC | PRN
  Administered 2016-10-13: 40 ug via INTRAVENOUS
  Administered 2016-10-13 (×2): 80 ug via INTRAVENOUS
  Administered 2016-10-13: 120 ug via INTRAVENOUS
  Administered 2016-10-13: 80 ug via INTRAVENOUS
  Administered 2016-10-13 (×2): 120 ug via INTRAVENOUS

## 2016-10-13 MED ORDER — FAT EMULSION 20 % IV EMUL
INTRAVENOUS | Status: AC
  Filled 2016-10-13: qty 250

## 2016-10-13 MED ORDER — GLYCOPYRROLATE 0.2 MG/ML IV SOSY
PREFILLED_SYRINGE | INTRAVENOUS | Status: DC | PRN
Start: 2016-10-13 — End: 2016-10-13
  Administered 2016-10-13: .2 mg via INTRAVENOUS

## 2016-10-13 MED ORDER — CHLORHEXIDINE GLUCONATE CLOTH 2 % EX PADS: 6.0000 | MEDICATED_PAD | Freq: Once | CUTANEOUS | Status: DC

## 2016-10-13 MED ORDER — HYDROCODONE-ACETAMINOPHEN 5-325 MG PO TABS: 1.0000 | ORAL_TABLET | ORAL | 0 refills | Status: DC | PRN

## 2016-10-13 MED ORDER — 0.9 % SODIUM CHLORIDE (POUR BTL) OPTIME
TOPICAL | Status: DC | PRN
  Administered 2016-10-13: 1000 mL

## 2016-10-13 MED ORDER — PHENYLEPHRINE 40 MCG/ML (10ML) SYRINGE FOR IV PUSH (FOR BLOOD PRESSURE SUPPORT)
PREFILLED_SYRINGE | INTRAVENOUS | Status: AC
  Filled 2016-10-13: qty 10

## 2016-10-13 MED ORDER — PROPOFOL 10 MG/ML IV BOLUS
INTRAVENOUS | Status: AC
  Filled 2016-10-13: qty 20

## 2016-10-13 MED ORDER — LIDOCAINE 2% (20 MG/ML) 5 ML SYRINGE
INTRAMUSCULAR | Status: AC
  Filled 2016-10-13: qty 5

## 2016-10-13 MED ORDER — SODIUM CHLORIDE 0.9 % IV SOLN
INTRAVENOUS | Status: DC | PRN
  Administered 2016-10-13 (×2): via INTRAVENOUS

## 2016-10-13 MED ORDER — MIDAZOLAM HCL 2 MG/2ML IJ SOLN: 1.0000 mg | Freq: Once | INTRAMUSCULAR | Status: DC

## 2016-10-13 MED ORDER — FENTANYL CITRATE (PF) 100 MCG/2ML IJ SOLN
INTRAMUSCULAR | Status: AC
  Filled 2016-10-13: qty 2

## 2016-10-13 MED ORDER — ONDANSETRON HCL 4 MG/2ML IJ SOLN
INTRAMUSCULAR | Status: DC | PRN
  Administered 2016-10-13: 4 mg via INTRAVENOUS

## 2016-10-13 MED ORDER — SODIUM CHLORIDE 0.9 % IV SOLN
INTRAVENOUS | Status: DC | PRN
  Administered 2016-10-13: 10:00:00

## 2016-10-13 MED ORDER — LIDOCAINE 2% (20 MG/ML) 5 ML SYRINGE
INTRAMUSCULAR | Status: DC | PRN
  Administered 2016-10-13: 100 mg via INTRAVENOUS

## 2016-10-13 MED ORDER — PROPOFOL 10 MG/ML IV BOLUS
INTRAVENOUS | Status: AC
  Filled 2016-10-13: qty 40

## 2016-10-13 MED ORDER — SUCCINYLCHOLINE CHLORIDE 200 MG/10ML IV SOSY
PREFILLED_SYRINGE | INTRAVENOUS | Status: DC | PRN
  Administered 2016-10-13: 120 mg via INTRAVENOUS

## 2016-10-13 MED ORDER — PROTAMINE SULFATE 10 MG/ML IV SOLN
INTRAVENOUS | Status: DC | PRN
  Administered 2016-10-13: 40 mg via INTRAVENOUS

## 2016-10-13 MED ORDER — PROPOFOL 10 MG/ML IV BOLUS
INTRAVENOUS | Status: DC | PRN
  Administered 2016-10-13: 200 mg via INTRAVENOUS
  Administered 2016-10-13: 20 mg via INTRAVENOUS
  Administered 2016-10-13 (×2): 30 mg via INTRAVENOUS

## 2016-10-13 MED ORDER — ONDANSETRON HCL 4 MG/2ML IJ SOLN
INTRAMUSCULAR | Status: AC
  Filled 2016-10-13: qty 2

## 2016-10-13 MED ORDER — MIDAZOLAM HCL 2 MG/2ML IJ SOLN
INTRAMUSCULAR | Status: AC
  Filled 2016-10-13: qty 2

## 2016-10-13 MED ORDER — PHENYLEPHRINE HCL 10 MG/ML IJ SOLN
INTRAVENOUS | Status: DC | PRN
  Administered 2016-10-13: 20 ug/min via INTRAVENOUS

## 2016-10-13 MED ORDER — SUCCINYLCHOLINE CHLORIDE 200 MG/10ML IV SOSY
PREFILLED_SYRINGE | INTRAVENOUS | Status: AC
  Filled 2016-10-13: qty 10

## 2016-10-13 MED ORDER — ALBUMIN HUMAN 5 % IV SOLN
INTRAVENOUS | Status: DC | PRN
  Administered 2016-10-13: 10:00:00 via INTRAVENOUS

## 2016-10-13 MED ORDER — EPHEDRINE SULFATE-NACL 50-0.9 MG/10ML-% IV SOSY
PREFILLED_SYRINGE | INTRAVENOUS | Status: DC | PRN
  Administered 2016-10-13 (×5): 10 mg via INTRAVENOUS

## 2016-10-13 MED ORDER — HEPARIN SODIUM (PORCINE) 1000 UNIT/ML IJ SOLN
INTRAMUSCULAR | Status: AC
  Filled 2016-10-13: qty 1

## 2016-10-13 MED ORDER — HYDROMORPHONE HCL 1 MG/ML IJ SOLN: 0.2500 mg | INTRAMUSCULAR | Status: DC | PRN

## 2016-10-13 MED ORDER — EPHEDRINE 5 MG/ML INJ
INTRAVENOUS | Status: AC
  Filled 2016-10-13: qty 10

## 2016-10-13 MED ORDER — PROTAMINE SULFATE 10 MG/ML IV SOLN
INTRAVENOUS | Status: AC
  Filled 2016-10-13: qty 5

## 2016-10-13 MED ORDER — MIDAZOLAM HCL 2 MG/2ML IJ SOLN
INTRAMUSCULAR | Status: DC | PRN
  Administered 2016-10-13: 1 mg via INTRAVENOUS

## 2016-10-13 SURGICAL SUPPLY — 37 items
ADH SKN CLS APL DERMABOND .7 (GAUZE/BANDAGES/DRESSINGS) ×2
ARMBAND PINK RESTRICT EXTREMIT (MISCELLANEOUS) ×3 IMPLANT
BANDAGE ELASTIC 4 VELCRO ST LF (GAUZE/BANDAGES/DRESSINGS) ×2 IMPLANT
CANISTER SUCT 3000ML PPV (MISCELLANEOUS) ×3 IMPLANT
CANNULA VESSEL 3MM 2 BLNT TIP (CANNULA) ×3 IMPLANT
CLIP TI MEDIUM 24 (CLIP) ×2 IMPLANT
CLIP TI WIDE RED SMALL 24 (CLIP) ×2 IMPLANT
COVER PROBE W GEL 5X96 (DRAPES) ×3 IMPLANT
DECANTER SPIKE VIAL GLASS SM (MISCELLANEOUS) ×3 IMPLANT
DERMABOND ADVANCED (GAUZE/BANDAGES/DRESSINGS) ×4
DERMABOND ADVANCED .7 DNX12 (GAUZE/BANDAGES/DRESSINGS) ×1 IMPLANT
ELECT REM PT RETURN 9FT ADLT (ELECTROSURGICAL) ×3
ELECTRODE REM PT RTRN 9FT ADLT (ELECTROSURGICAL) ×1 IMPLANT
GAUZE SPONGE 4X4 12PLY STRL LF (GAUZE/BANDAGES/DRESSINGS) ×2 IMPLANT
GAUZE SPONGE 4X4 16PLY XRAY LF (GAUZE/BANDAGES/DRESSINGS) ×2 IMPLANT
GLOVE BIO SURGEON STRL SZ7.5 (GLOVE) ×5 IMPLANT
GLOVE BIOGEL PI IND STRL 6.5 (GLOVE) IMPLANT
GLOVE BIOGEL PI IND STRL 7.0 (GLOVE) IMPLANT
GLOVE BIOGEL PI IND STRL 8 (GLOVE) ×1 IMPLANT
GLOVE BIOGEL PI INDICATOR 6.5 (GLOVE) ×6
GLOVE BIOGEL PI INDICATOR 7.0 (GLOVE) ×4
GLOVE BIOGEL PI INDICATOR 8 (GLOVE) ×6
GLOVE SURG SS PI 6.5 STRL IVOR (GLOVE) ×6 IMPLANT
GOWN STRL REUS W/ TWL LRG LVL3 (GOWN DISPOSABLE) ×3 IMPLANT
GOWN STRL REUS W/TWL LRG LVL3 (GOWN DISPOSABLE) ×9
KIT BASIN OR (CUSTOM PROCEDURE TRAY) ×3 IMPLANT
KIT ROOM TURNOVER OR (KITS) ×3 IMPLANT
NS IRRIG 1000ML POUR BTL (IV SOLUTION) ×3 IMPLANT
PACK CV ACCESS (CUSTOM PROCEDURE TRAY) ×3 IMPLANT
PAD ARMBOARD 7.5X6 YLW CONV (MISCELLANEOUS) ×6 IMPLANT
SPONGE SURGIFOAM ABS GEL 100 (HEMOSTASIS) IMPLANT
SUT PROLENE 6 0 BV (SUTURE) ×5 IMPLANT
SUT VIC AB 3-0 SH 27 (SUTURE) ×15
SUT VIC AB 3-0 SH 27X BRD (SUTURE) ×1 IMPLANT
SUT VICRYL 4-0 PS2 18IN ABS (SUTURE) ×5 IMPLANT
UNDERPAD 30X30 (UNDERPADS AND DIAPERS) ×3 IMPLANT
WATER STERILE IRR 1000ML POUR (IV SOLUTION) ×3 IMPLANT

## 2016-10-13 NOTE — Anesthesia Procedure Notes (Signed)
Procedure Name: Intubation Date/Time: 10/13/2016 9:52 AM Performed by: Garrison Columbus T Pre-anesthesia Checklist: Patient identified, Emergency Drugs available, Suction available and Patient being monitored Patient Re-evaluated:Patient Re-evaluated prior to inductionOxygen Delivery Method: Circle System Utilized Preoxygenation: Pre-oxygenation with 100% oxygen Intubation Type: IV induction Ventilation: Mask ventilation without difficulty, Oral airway inserted - appropriate to patient size and Two handed mask ventilation required Laryngoscope Size: Miller and 2 Grade View: Grade I Tube type: Oral Tube size: 7.5 mm Number of attempts: 1 Airway Equipment and Method: Stylet and Oral airway Placement Confirmation: ETT inserted through vocal cords under direct vision,  positive ETCO2 and breath sounds checked- equal and bilateral Secured at: 23 cm Tube secured with: Tape Dental Injury: Teeth and Oropharynx as per pre-operative assessment

## 2016-10-13 NOTE — Op Note (Signed)
    NAME: Jacob [name]    MRN: [id number] DOB: 1950-09-01    DATE OF OPERATION: 10/13/2016  PREOP DIAGNOSIS: Difficult to access right brachiocephalic AV fistula  POSTOP DIAGNOSIS: Same  PROCEDURE: Superficialization of right brachiocephalic AV fistula  SURGEON: Judeth Cornfield. Scot Dock, MD, FACS  ASSIST: Lafe Garin, RNFA  ANESTHESIA: Gen.   EBL: 100 cc  INDICATIONS: [name] is a 66 y.o. male who has a right brachiocephalic AV fistula which is difficult to access. He underwent a fistulogram which showed no problems except that the vein was deep. He was scheduled for a Superficialization.   FINDINGS: Excellent thrill at the completion of the procedure.  TECHNIQUE: The patient was taken to the operating room and received a general anesthetic. The right arm was prepped and draped in usual sterile fashion. Using 3 incisions along the fistula in the upper arm the cephalic vein was dissected free circumferentially from just above the antecubital level up to the axilla. 2 significant branches were ligated and divided. There was significant scar tissue around the fistula where it had been previously mobilized. Once the vein had been fully mobilized and I had debrided some of the overlying adipose tissue, I closed the deep layer beneath the vein to lifted to the surface through all 3 incisions. Each of the 3 incisions was then closed with 4-0 Vicryl. Sterile dressing was applied. Patient tolerated the procedure well and transferred to the recovery room in stable condition. All needle and sponge counts were correct.  [name], MD, FACS [facility]  DATE OF DICTATION:   10/13/2016

## 2016-10-13 NOTE — H&P (View-Only) (Signed)
Patient name: Jacob [name] MRN: [id number] DOB: 05-22-1951 Sex: male  REASON FOR VISIT: follow-up  HPI: Jacob [name] is a 66 y.o. male who underwent right brachiocephalic AV fistula creation and superficialization on 07/28/2016. He also underwent ligation of his right radial cephalic fistula at that time (due to poor function).  He was last seen in our office on 09/02/16. At that time, his fistula was not yet mature. He was advised to follow up in one month with repeat duplex. He currently dialyzes via a right IJ tunneled dialysis catheter on Mondays, Wednesdays and Fridays. He has baseline numbness in his hands bilaterally. He denies any pain in hands.    Current Outpatient Prescriptions  Medication Sig Dispense Refill  . acetaminophen (TYLENOL) 500 MG tablet Take 1,000 mg by mouth every 6 (six) hours as needed for mild pain.    [name] aspirin EC 325 MG tablet Take 325 mg by mouth daily.    . B Complex-C-Folic Acid (DIALYVITE TABLET) TABS Take 1 tablet by mouth every Monday, Wednesday, and Friday.     . carvedilol (COREG) 6.25 MG tablet Take 6.25 mg by mouth 2 (two) times daily with a meal. *Not taken on dialysis days, Mondays, Wednesdays, and Fridays    . cinacalcet (SENSIPAR) 30 MG tablet Take 30 mg by mouth daily.    [name] EPIPEN 2-PAK 0.3 MG/0.3ML SOAJ injection Inject 0.3 mg into the muscle once as needed (For anaphylaxis.).     [name] etodolac (LODINE) 400 MG tablet Take 400 mg by mouth 2 (two) times daily as needed for mild pain or moderate pain.    [name] LANTUS SOLOSTAR 100 UNIT/ML Solostar Pen Inject 40 Units into the skin at bedtime.     [name] omeprazole (PRILOSEC) 40 MG capsule Take 40 mg by mouth daily as needed (for acid reflux).     [name] oxyCODONE-acetaminophen (PERCOCET/ROXICET) 5-325 MG tablet Take 1 tablet by mouth every 6 (six) hours as needed. 15 tablet 0  . ramipril (ALTACE) 10 MG capsule Take 10 mg by mouth daily.     [name] RENVELA 800 MG tablet Take 800-2,400 mg by mouth 3 (three) times daily with  meals. Take 2400 mg by mouth 3 times daily with meals and 800 mg by mouth with snacks    . zolpidem (AMBIEN) 10 MG tablet Take 10 mg by mouth at bedtime as needed for sleep.      No current facility-administered medications for this visit.     REVIEW OF SYSTEMS:  [X]  denotes positive finding, [ ]  denotes negative finding Cardiac  Comments:  Chest pain or chest pressure:    Shortness of breath upon exertion:    Short of breath when lying flat:    Irregular heart rhythm:    Constitutional    Fever or chills:      PHYSICAL EXAM: Vitals:   09/30/16 1550 09/30/16 1555  BP: (!) 186/86 (!) 188/91  Pulse: 68   Resp: 20   Temp: 97.9 F (36.6 C)   TempSrc: Oral   SpO2: 96%   Weight: 300 lb (136.1 kg)   Height: 6\' 2"  (1.88 m)     GENERAL: The patient is a well-nourished male, in no acute distress. The vital signs are documented above. VASCULAR: Right upper arm fistula with palpable thrill. Palpable thrill at wrist.   DATA:  Dialysis access duplex 09/30/16 0.82 cm at distal brachium, 0.49 cm at mid upper arm, 0.56 cm at proximal upper arm.   Depth 0.68 cm  distal brachium, 1 cm mid upper arm and 1.5 cm proximal upper arm.   MEDICAL ISSUES: ESRD on HD (MWF) Slowly maturing right brachiocephalic fistula (48/34/75)  Essentially no changes in diameter since last duplex one month ago. Discussed with Dr. Scot Dock who recommend fistulogram. He currently is dialyzing via a right IJ TDC. Will schedule right arm fistulogram on non dialysis day.    Jacob Jock, PA-C [facility] MD: Scot Dock

## 2016-10-13 NOTE — Interval H&P Note (Signed)
History and Physical Interval Note:  10/13/2016 9:19 AM  Jacob [name]  has presented today for surgery, with the diagnosis of End stage renal disease N18.6  The various methods of treatment have been discussed with the patient and family. After consideration of risks, benefits and other options for treatment, the patient has consented to  Procedure(s): BRACHIOCEPHALIC ARTERIOVENOUS FISTULA SUPERFICIALIZATION (Right) as a surgical intervention .  The patient's history has been reviewed, patient examined, no change in status, stable for surgery.  I have reviewed the patient's chart and labs.  Questions were answered to the patient's satisfaction.     [name]

## 2016-10-13 NOTE — Transfer of Care (Signed)
Immediate Anesthesia Transfer of Care Note  Patient: Jacob [name]  Procedure(s) Performed: Procedure(s): BRACHIOCEPHALIC ARTERIOVENOUS FISTULA SUPERFICIALIZATION (Right)  Patient Location: PACU  Anesthesia Type:General  Level of Consciousness: awake, alert  and oriented  Airway & Oxygen Therapy: Patient Spontanous Breathing and Patient connected to nasal cannula oxygen  Post-op Assessment: Report given to RN, Post -op Vital signs reviewed and stable and Patient moving all extremities X 4  Post vital signs: Reviewed and stable  Last Vitals:  Vitals:   10/13/16 0707  BP: (!) 171/82  Pulse: 65  Resp: 16  Temp: 36.9 C    Last Pain:  Vitals:   10/13/16 0707  TempSrc: Oral      Patients Stated Pain Goal: 5 (02/33/43 5686)  Complications: No apparent anesthesia complications

## 2016-10-13 NOTE — Telephone Encounter (Signed)
-----   Message from Mena Goes, RN sent at 10/13/2016 12:49 PM EDT ----- Regarding: 4 weeks   ----- Message ----- From: [name], MD Sent: 10/13/2016  12:33 PM To: Vvs Charge Pool Subject: charge and f/u                                 PROCEDURE: Superficialization of right brachiocephalic AV fistula  SURGEON: Judeth Cornfield. Scot Dock, MD, FACS  ASSIST: Lafe Garin, RNFA  He will need a follow up visit in 4 weeks with a duplex at that time to check on his fistula. CD

## 2016-10-13 NOTE — Telephone Encounter (Signed)
LVM on home # for appts [place] on 4-9 and PO on 4/11  Letter sent

## 2016-10-13 NOTE — Anesthesia Postprocedure Evaluation (Signed)
Anesthesia Post Note  Patient: Jacob [name]  Procedure(s) Performed: Procedure(s) (LRB): BRACHIOCEPHALIC ARTERIOVENOUS FISTULA SUPERFICIALIZATION (Right)  Patient location during evaluation: PACU Anesthesia Type: General Level of consciousness: awake Pain management: pain level controlled Vital Signs Assessment: post-procedure vital signs reviewed and stable Respiratory status: spontaneous breathing Cardiovascular status: stable Anesthetic complications: no       Last Vitals:  Vitals:   10/13/16 1315 10/13/16 1322  BP: 136/61 125/62  Pulse: 69 70  Resp: 19 19  Temp:      Last Pain:  Vitals:   10/13/16 0707  TempSrc: Oral                 Yemaya Barnier

## 2016-10-13 NOTE — Anesthesia Preprocedure Evaluation (Addendum)
Anesthesia Evaluation  Patient identified by MRN, date of birth, ID band Patient awake    Reviewed: Allergy & Precautions, NPO status , Patient's Chart, lab work & pertinent test results  Airway Mallampati: II  TM Distance: >3 FB Neck ROM: Full    Dental  (+) Dental Advisory Given, Edentulous Upper, Loose, Poor Dentition,    Pulmonary shortness of breath and with exertion,    breath sounds clear to auscultation       Cardiovascular hypertension,  Rhythm:Regular Rate:Normal     Neuro/Psych PSYCHIATRIC DISORDERS Anxiety Depression CVA    GI/Hepatic GERD  Medicated,  Endo/Other  diabetes  Renal/GU ESRF and DialysisRenal disease     Musculoskeletal  (+) Arthritis ,   Abdominal   Peds  Hematology   Anesthesia Other Findings   Reproductive/Obstetrics                        Anesthesia Physical Anesthesia Plan  ASA: III  Anesthesia Plan: General   Post-op Pain Management:    Induction: Intravenous  Airway Management Planned: LMA  Additional Equipment:   Intra-op Plan:   Post-operative Plan:   Informed Consent: I have reviewed the patients History and Physical, chart, labs and discussed the procedure including the risks, benefits and alternatives for the proposed anesthesia with the patient or authorized representative who has indicated his/her understanding and acceptance.   Dental advisory given  Plan Discussed with: CRNA, Anesthesiologist and Surgeon  Anesthesia Plan Comments:         Anesthesia Quick Evaluation

## 2016-10-14 ENCOUNTER — Encounter (HOSPITAL_COMMUNITY): Payer: Self-pay | Admitting: Vascular Surgery

## 2016-10-14 DIAGNOSIS — D509 Iron deficiency anemia, unspecified: Secondary | ICD-10-CM | POA: Diagnosis not present

## 2016-10-14 DIAGNOSIS — N2581 Secondary hyperparathyroidism of renal origin: Secondary | ICD-10-CM | POA: Diagnosis not present

## 2016-10-14 DIAGNOSIS — Z992 Dependence on renal dialysis: Secondary | ICD-10-CM | POA: Diagnosis not present

## 2016-10-14 DIAGNOSIS — N186 End stage renal disease: Secondary | ICD-10-CM | POA: Diagnosis not present

## 2016-10-16 DIAGNOSIS — D509 Iron deficiency anemia, unspecified: Secondary | ICD-10-CM | POA: Diagnosis not present

## 2016-10-16 DIAGNOSIS — N186 End stage renal disease: Secondary | ICD-10-CM | POA: Diagnosis not present

## 2016-10-16 DIAGNOSIS — N2581 Secondary hyperparathyroidism of renal origin: Secondary | ICD-10-CM | POA: Diagnosis not present

## 2016-10-16 DIAGNOSIS — Z992 Dependence on renal dialysis: Secondary | ICD-10-CM | POA: Diagnosis not present

## 2016-10-19 DIAGNOSIS — D509 Iron deficiency anemia, unspecified: Secondary | ICD-10-CM | POA: Diagnosis not present

## 2016-10-19 DIAGNOSIS — N2581 Secondary hyperparathyroidism of renal origin: Secondary | ICD-10-CM | POA: Diagnosis not present

## 2016-10-19 DIAGNOSIS — Z992 Dependence on renal dialysis: Secondary | ICD-10-CM | POA: Diagnosis not present

## 2016-10-19 DIAGNOSIS — N186 End stage renal disease: Secondary | ICD-10-CM | POA: Diagnosis not present

## 2016-10-21 DIAGNOSIS — D509 Iron deficiency anemia, unspecified: Secondary | ICD-10-CM | POA: Diagnosis not present

## 2016-10-21 DIAGNOSIS — N2581 Secondary hyperparathyroidism of renal origin: Secondary | ICD-10-CM | POA: Diagnosis not present

## 2016-10-21 DIAGNOSIS — N186 End stage renal disease: Secondary | ICD-10-CM | POA: Diagnosis not present

## 2016-10-21 DIAGNOSIS — Z992 Dependence on renal dialysis: Secondary | ICD-10-CM | POA: Diagnosis not present

## 2016-10-23 DIAGNOSIS — D509 Iron deficiency anemia, unspecified: Secondary | ICD-10-CM | POA: Diagnosis not present

## 2016-10-23 DIAGNOSIS — N186 End stage renal disease: Secondary | ICD-10-CM | POA: Diagnosis not present

## 2016-10-23 DIAGNOSIS — Z992 Dependence on renal dialysis: Secondary | ICD-10-CM | POA: Diagnosis not present

## 2016-10-23 DIAGNOSIS — N2581 Secondary hyperparathyroidism of renal origin: Secondary | ICD-10-CM | POA: Diagnosis not present

## 2016-10-26 DIAGNOSIS — E119 Type 2 diabetes mellitus without complications: Secondary | ICD-10-CM | POA: Diagnosis not present

## 2016-10-26 DIAGNOSIS — N2581 Secondary hyperparathyroidism of renal origin: Secondary | ICD-10-CM | POA: Diagnosis not present

## 2016-10-26 DIAGNOSIS — Z992 Dependence on renal dialysis: Secondary | ICD-10-CM | POA: Diagnosis not present

## 2016-10-26 DIAGNOSIS — D509 Iron deficiency anemia, unspecified: Secondary | ICD-10-CM | POA: Diagnosis not present

## 2016-10-26 DIAGNOSIS — Z794 Long term (current) use of insulin: Secondary | ICD-10-CM | POA: Diagnosis not present

## 2016-10-26 DIAGNOSIS — N186 End stage renal disease: Secondary | ICD-10-CM | POA: Diagnosis not present

## 2016-10-28 DIAGNOSIS — N2581 Secondary hyperparathyroidism of renal origin: Secondary | ICD-10-CM | POA: Diagnosis not present

## 2016-10-28 DIAGNOSIS — Z992 Dependence on renal dialysis: Secondary | ICD-10-CM | POA: Diagnosis not present

## 2016-10-28 DIAGNOSIS — D509 Iron deficiency anemia, unspecified: Secondary | ICD-10-CM | POA: Diagnosis not present

## 2016-10-28 DIAGNOSIS — N186 End stage renal disease: Secondary | ICD-10-CM | POA: Diagnosis not present

## 2016-10-30 DIAGNOSIS — N2581 Secondary hyperparathyroidism of renal origin: Secondary | ICD-10-CM | POA: Diagnosis not present

## 2016-10-30 DIAGNOSIS — N186 End stage renal disease: Secondary | ICD-10-CM | POA: Diagnosis not present

## 2016-10-30 DIAGNOSIS — D509 Iron deficiency anemia, unspecified: Secondary | ICD-10-CM | POA: Diagnosis not present

## 2016-10-30 DIAGNOSIS — Z992 Dependence on renal dialysis: Secondary | ICD-10-CM | POA: Diagnosis not present

## 2016-10-31 DIAGNOSIS — D509 Iron deficiency anemia, unspecified: Secondary | ICD-10-CM | POA: Diagnosis not present

## 2016-10-31 DIAGNOSIS — N2581 Secondary hyperparathyroidism of renal origin: Secondary | ICD-10-CM | POA: Diagnosis not present

## 2016-10-31 DIAGNOSIS — Z992 Dependence on renal dialysis: Secondary | ICD-10-CM | POA: Diagnosis not present

## 2016-10-31 DIAGNOSIS — N186 End stage renal disease: Secondary | ICD-10-CM | POA: Diagnosis not present

## 2016-11-01 DIAGNOSIS — T82898A Other specified complication of vascular prosthetic devices, implants and grafts, initial encounter: Secondary | ICD-10-CM | POA: Diagnosis not present

## 2016-11-01 DIAGNOSIS — T82848A Pain from vascular prosthetic devices, implants and grafts, initial encounter: Secondary | ICD-10-CM | POA: Diagnosis not present

## 2016-11-01 DIAGNOSIS — Z992 Dependence on renal dialysis: Secondary | ICD-10-CM | POA: Diagnosis not present

## 2016-11-01 DIAGNOSIS — D631 Anemia in chronic kidney disease: Secondary | ICD-10-CM | POA: Diagnosis not present

## 2016-11-01 DIAGNOSIS — D509 Iron deficiency anemia, unspecified: Secondary | ICD-10-CM | POA: Diagnosis not present

## 2016-11-01 DIAGNOSIS — N186 End stage renal disease: Secondary | ICD-10-CM | POA: Diagnosis not present

## 2016-11-01 DIAGNOSIS — N2581 Secondary hyperparathyroidism of renal origin: Secondary | ICD-10-CM | POA: Diagnosis not present

## 2016-11-02 DIAGNOSIS — D509 Iron deficiency anemia, unspecified: Secondary | ICD-10-CM | POA: Diagnosis not present

## 2016-11-02 DIAGNOSIS — N186 End stage renal disease: Secondary | ICD-10-CM | POA: Diagnosis not present

## 2016-11-02 DIAGNOSIS — T82898A Other specified complication of vascular prosthetic devices, implants and grafts, initial encounter: Secondary | ICD-10-CM | POA: Diagnosis not present

## 2016-11-02 DIAGNOSIS — N2581 Secondary hyperparathyroidism of renal origin: Secondary | ICD-10-CM | POA: Diagnosis not present

## 2016-11-02 DIAGNOSIS — D631 Anemia in chronic kidney disease: Secondary | ICD-10-CM | POA: Diagnosis not present

## 2016-11-02 DIAGNOSIS — T82848A Pain from vascular prosthetic devices, implants and grafts, initial encounter: Secondary | ICD-10-CM | POA: Diagnosis not present

## 2016-11-03 ENCOUNTER — Encounter: Payer: Self-pay | Admitting: Vascular Surgery

## 2016-11-04 DIAGNOSIS — N186 End stage renal disease: Secondary | ICD-10-CM | POA: Diagnosis not present

## 2016-11-04 DIAGNOSIS — N2581 Secondary hyperparathyroidism of renal origin: Secondary | ICD-10-CM | POA: Diagnosis not present

## 2016-11-04 DIAGNOSIS — D509 Iron deficiency anemia, unspecified: Secondary | ICD-10-CM | POA: Diagnosis not present

## 2016-11-04 DIAGNOSIS — T82898A Other specified complication of vascular prosthetic devices, implants and grafts, initial encounter: Secondary | ICD-10-CM | POA: Diagnosis not present

## 2016-11-04 DIAGNOSIS — T82848A Pain from vascular prosthetic devices, implants and grafts, initial encounter: Secondary | ICD-10-CM | POA: Diagnosis not present

## 2016-11-04 DIAGNOSIS — D631 Anemia in chronic kidney disease: Secondary | ICD-10-CM | POA: Diagnosis not present

## 2016-11-05 ENCOUNTER — Other Ambulatory Visit: Payer: Self-pay | Admitting: *Deleted

## 2016-11-05 DIAGNOSIS — Z992 Dependence on renal dialysis: Principal | ICD-10-CM

## 2016-11-05 DIAGNOSIS — N186 End stage renal disease: Secondary | ICD-10-CM

## 2016-11-06 DIAGNOSIS — N2581 Secondary hyperparathyroidism of renal origin: Secondary | ICD-10-CM | POA: Diagnosis not present

## 2016-11-06 DIAGNOSIS — T82848A Pain from vascular prosthetic devices, implants and grafts, initial encounter: Secondary | ICD-10-CM | POA: Diagnosis not present

## 2016-11-06 DIAGNOSIS — T82898A Other specified complication of vascular prosthetic devices, implants and grafts, initial encounter: Secondary | ICD-10-CM | POA: Diagnosis not present

## 2016-11-06 DIAGNOSIS — N186 End stage renal disease: Secondary | ICD-10-CM | POA: Diagnosis not present

## 2016-11-06 DIAGNOSIS — D631 Anemia in chronic kidney disease: Secondary | ICD-10-CM | POA: Diagnosis not present

## 2016-11-06 DIAGNOSIS — D509 Iron deficiency anemia, unspecified: Secondary | ICD-10-CM | POA: Diagnosis not present

## 2016-11-09 ENCOUNTER — Ambulatory Visit (HOSPITAL_COMMUNITY)
Admission: RE | Admit: 2016-11-09 | Discharge: 2016-11-09 | Disposition: A | Discharge status: Home / Self Care | Payer: Medicare Other | Source: Ambulatory Visit | Attending: Surgery | Admitting: Surgery

## 2016-11-09 DIAGNOSIS — N186 End stage renal disease: Secondary | ICD-10-CM | POA: Diagnosis not present

## 2016-11-09 DIAGNOSIS — Z992 Dependence on renal dialysis: Secondary | ICD-10-CM | POA: Insufficient documentation

## 2016-11-09 DIAGNOSIS — N2581 Secondary hyperparathyroidism of renal origin: Secondary | ICD-10-CM | POA: Diagnosis not present

## 2016-11-09 DIAGNOSIS — D509 Iron deficiency anemia, unspecified: Secondary | ICD-10-CM | POA: Diagnosis not present

## 2016-11-09 DIAGNOSIS — T82848A Pain from vascular prosthetic devices, implants and grafts, initial encounter: Secondary | ICD-10-CM | POA: Diagnosis not present

## 2016-11-09 DIAGNOSIS — T82898A Other specified complication of vascular prosthetic devices, implants and grafts, initial encounter: Secondary | ICD-10-CM | POA: Diagnosis not present

## 2016-11-09 DIAGNOSIS — D631 Anemia in chronic kidney disease: Secondary | ICD-10-CM | POA: Diagnosis not present

## 2016-11-11 ENCOUNTER — Encounter: Payer: Self-pay | Admitting: Vascular Surgery

## 2016-11-11 ENCOUNTER — Ambulatory Visit (INDEPENDENT_AMBULATORY_CARE_PROVIDER_SITE_OTHER): Payer: Self-pay | Admitting: Vascular Surgery

## 2016-11-11 VITALS — BP 179/85 | HR 70 | Temp 98.0°F | Resp 24 | Ht 74.0 in | Wt 298.0 lb

## 2016-11-11 DIAGNOSIS — D509 Iron deficiency anemia, unspecified: Secondary | ICD-10-CM | POA: Diagnosis not present

## 2016-11-11 DIAGNOSIS — N2581 Secondary hyperparathyroidism of renal origin: Secondary | ICD-10-CM | POA: Diagnosis not present

## 2016-11-11 DIAGNOSIS — Z992 Dependence on renal dialysis: Secondary | ICD-10-CM

## 2016-11-11 DIAGNOSIS — N186 End stage renal disease: Secondary | ICD-10-CM | POA: Diagnosis not present

## 2016-11-11 DIAGNOSIS — T82898A Other specified complication of vascular prosthetic devices, implants and grafts, initial encounter: Secondary | ICD-10-CM | POA: Diagnosis not present

## 2016-11-11 DIAGNOSIS — T82848A Pain from vascular prosthetic devices, implants and grafts, initial encounter: Secondary | ICD-10-CM | POA: Diagnosis not present

## 2016-11-11 DIAGNOSIS — D631 Anemia in chronic kidney disease: Secondary | ICD-10-CM | POA: Diagnosis not present

## 2016-11-11 NOTE — Progress Notes (Signed)
Patient name: Jacob [name] MRN: [id number] DOB: 06-05-1951 Sex: male  REASON FOR VISIT: Follow up after superficialization  of right brachiocephalic fistula.  HPI: Jacob [name] is a 66 y.o. male who had a right brachiocephalic fistula placed which has been difficult to access. He underwent a fistulogram which showed no problems except that the vein was deep. He was scheduled for Superficialization. This was performed on 10/13/2016. He comes in for a follow up visit.  Current Outpatient Prescriptions  Medication Sig Dispense Refill  . acetaminophen (TYLENOL) 500 MG tablet Take 1,000 mg by mouth every 6 (six) hours as needed for mild pain.    [name] aspirin EC 325 MG tablet Take 325 mg by mouth daily.    . B Complex-C-Folic Acid (DIALYVITE TABLET) TABS Take 1 tablet by mouth every Monday, Wednesday, and Friday.     . carvedilol (COREG) 6.25 MG tablet Take 6.25 mg by mouth 2 (two) times daily with a meal. Take 6.5mg s once daily on dialysis days, Mondays, Wednesdays, and Fridays    . cinacalcet (SENSIPAR) 30 MG tablet Take 30 mg by mouth daily.    [name] EPIPEN 2-PAK 0.3 MG/0.3ML SOAJ injection Inject 0.3 mg into the muscle once as needed (For anaphylaxis.).     [name] HYDROcodone-acetaminophen (NORCO/VICODIN) 5-325 MG tablet Take 1 tablet by mouth 2 (two) times daily as needed for moderate pain.    [name] HYDROcodone-acetaminophen (NORCO/VICODIN) 5-325 MG tablet Take 1-2 tablets by mouth every 4 (four) hours as needed. 20 tablet 0  . LANTUS SOLOSTAR 100 UNIT/ML Solostar Pen Inject 40 Units into the skin at bedtime. If blood sugar is lower than 150 only inject 20 units at bedtime    . loratadine (CLARITIN) 10 MG tablet Take 10 mg by mouth daily as needed for allergies.    [name] omeprazole (PRILOSEC) 40 MG capsule Take 40 mg by mouth daily.     . ramipril (ALTACE) 10 MG capsule Take 10 mg by mouth daily.     [name] RENVELA 800 MG tablet Take 800-2,400 mg by mouth 3 (three) times daily with meals. Take 2400 mg by mouth 3 times  daily with meals and 800 mg by mouth with snacks    . oxyCODONE-acetaminophen (PERCOCET/ROXICET) 5-325 MG tablet Take 1 tablet by mouth every 6 (six) hours as needed. (Patient not taking: Reported on 11/11/2016) 15 tablet 0   No current facility-administered medications for this visit.     REVIEW OF SYSTEMS:  [X]  denotes positive finding, [ ]  denotes negative finding Cardiac  Comments:  Chest pain or chest pressure:    Shortness of breath upon exertion:    Short of breath when lying flat:    Irregular heart rhythm:    Constitutional    Fever or chills:      PHYSICAL EXAM: Vitals:   11/11/16 1537 11/11/16 1539  BP: (!) 176/83 (!) 179/85  Pulse: 70   Resp: (!) 24   Temp: 98 F (36.7 C)   TempSrc: Oral   SpO2: 93%   Weight: 298 lb (135.2 kg)   Height: 6\' 2"  (1.88 m)     GENERAL: The patient is a well-nourished male, in no acute distress. The vital signs are documented above. CARDIOVASCULAR: There is a regular rate and rhythm. PULMONARY: There is good air exchange bilaterally without wheezing or rales. This fistula has an excellent thrill and is easily palpable. The incisions of healed except for the most central incision which has some slight separation (about 2 mm).  DUPLEX OF RIGHT BRACHIOCEPHALIC FISTULA: I have reviewed his right brachiocephalic fistula duplex that was performed on 11/19/2016. This shows that the diameters of the fistula range from 0.42-0.69 cm.  MEDICAL ISSUES:  STATUS POST SUPERFICIALIZATION OF RIGHT BRACHIOCEPHALIC AV FISTULA: The fistula appears to be easily accessible at this point however I would prefer not to use the fistula until the slight area of separation has healed. I've instructed him to keep bacitracin on this and a Band-Aid over this area and would like to see him back to be sure that this is healed. However he felt that he could simply let us know if there are any problems.   [name] [facility]  [place] [phone]

## 2016-11-13 DIAGNOSIS — T82898A Other specified complication of vascular prosthetic devices, implants and grafts, initial encounter: Secondary | ICD-10-CM | POA: Diagnosis not present

## 2016-11-13 DIAGNOSIS — N186 End stage renal disease: Secondary | ICD-10-CM | POA: Diagnosis not present

## 2016-11-13 DIAGNOSIS — D631 Anemia in chronic kidney disease: Secondary | ICD-10-CM | POA: Diagnosis not present

## 2016-11-13 DIAGNOSIS — N2581 Secondary hyperparathyroidism of renal origin: Secondary | ICD-10-CM | POA: Diagnosis not present

## 2016-11-13 DIAGNOSIS — T82848A Pain from vascular prosthetic devices, implants and grafts, initial encounter: Secondary | ICD-10-CM | POA: Diagnosis not present

## 2016-11-13 DIAGNOSIS — D509 Iron deficiency anemia, unspecified: Secondary | ICD-10-CM | POA: Diagnosis not present

## 2016-11-16 DIAGNOSIS — T82848A Pain from vascular prosthetic devices, implants and grafts, initial encounter: Secondary | ICD-10-CM | POA: Diagnosis not present

## 2016-11-16 DIAGNOSIS — D509 Iron deficiency anemia, unspecified: Secondary | ICD-10-CM | POA: Diagnosis not present

## 2016-11-16 DIAGNOSIS — N186 End stage renal disease: Secondary | ICD-10-CM | POA: Diagnosis not present

## 2016-11-16 DIAGNOSIS — D631 Anemia in chronic kidney disease: Secondary | ICD-10-CM | POA: Diagnosis not present

## 2016-11-16 DIAGNOSIS — T82898A Other specified complication of vascular prosthetic devices, implants and grafts, initial encounter: Secondary | ICD-10-CM | POA: Diagnosis not present

## 2016-11-16 DIAGNOSIS — N2581 Secondary hyperparathyroidism of renal origin: Secondary | ICD-10-CM | POA: Diagnosis not present

## 2016-11-18 DIAGNOSIS — D509 Iron deficiency anemia, unspecified: Secondary | ICD-10-CM | POA: Diagnosis not present

## 2016-11-18 DIAGNOSIS — T82848A Pain from vascular prosthetic devices, implants and grafts, initial encounter: Secondary | ICD-10-CM | POA: Diagnosis not present

## 2016-11-18 DIAGNOSIS — N186 End stage renal disease: Secondary | ICD-10-CM | POA: Diagnosis not present

## 2016-11-18 DIAGNOSIS — D631 Anemia in chronic kidney disease: Secondary | ICD-10-CM | POA: Diagnosis not present

## 2016-11-18 DIAGNOSIS — T82898A Other specified complication of vascular prosthetic devices, implants and grafts, initial encounter: Secondary | ICD-10-CM | POA: Diagnosis not present

## 2016-11-18 DIAGNOSIS — N2581 Secondary hyperparathyroidism of renal origin: Secondary | ICD-10-CM | POA: Diagnosis not present

## 2016-11-20 DIAGNOSIS — D509 Iron deficiency anemia, unspecified: Secondary | ICD-10-CM | POA: Diagnosis not present

## 2016-11-20 DIAGNOSIS — N186 End stage renal disease: Secondary | ICD-10-CM | POA: Diagnosis not present

## 2016-11-20 DIAGNOSIS — T82848A Pain from vascular prosthetic devices, implants and grafts, initial encounter: Secondary | ICD-10-CM | POA: Diagnosis not present

## 2016-11-20 DIAGNOSIS — N2581 Secondary hyperparathyroidism of renal origin: Secondary | ICD-10-CM | POA: Diagnosis not present

## 2016-11-20 DIAGNOSIS — T82898A Other specified complication of vascular prosthetic devices, implants and grafts, initial encounter: Secondary | ICD-10-CM | POA: Diagnosis not present

## 2016-11-20 DIAGNOSIS — D631 Anemia in chronic kidney disease: Secondary | ICD-10-CM | POA: Diagnosis not present

## 2016-11-23 DIAGNOSIS — N2581 Secondary hyperparathyroidism of renal origin: Secondary | ICD-10-CM | POA: Diagnosis not present

## 2016-11-23 DIAGNOSIS — T82848A Pain from vascular prosthetic devices, implants and grafts, initial encounter: Secondary | ICD-10-CM | POA: Diagnosis not present

## 2016-11-23 DIAGNOSIS — D631 Anemia in chronic kidney disease: Secondary | ICD-10-CM | POA: Diagnosis not present

## 2016-11-23 DIAGNOSIS — N186 End stage renal disease: Secondary | ICD-10-CM | POA: Diagnosis not present

## 2016-11-23 DIAGNOSIS — T82898A Other specified complication of vascular prosthetic devices, implants and grafts, initial encounter: Secondary | ICD-10-CM | POA: Diagnosis not present

## 2016-11-23 DIAGNOSIS — D509 Iron deficiency anemia, unspecified: Secondary | ICD-10-CM | POA: Diagnosis not present

## 2016-11-25 DIAGNOSIS — T82848A Pain from vascular prosthetic devices, implants and grafts, initial encounter: Secondary | ICD-10-CM | POA: Diagnosis not present

## 2016-11-25 DIAGNOSIS — N186 End stage renal disease: Secondary | ICD-10-CM | POA: Diagnosis not present

## 2016-11-25 DIAGNOSIS — N2581 Secondary hyperparathyroidism of renal origin: Secondary | ICD-10-CM | POA: Diagnosis not present

## 2016-11-25 DIAGNOSIS — D631 Anemia in chronic kidney disease: Secondary | ICD-10-CM | POA: Diagnosis not present

## 2016-11-25 DIAGNOSIS — D509 Iron deficiency anemia, unspecified: Secondary | ICD-10-CM | POA: Diagnosis not present

## 2016-11-25 DIAGNOSIS — T82898A Other specified complication of vascular prosthetic devices, implants and grafts, initial encounter: Secondary | ICD-10-CM | POA: Diagnosis not present

## 2016-11-27 DIAGNOSIS — T82898A Other specified complication of vascular prosthetic devices, implants and grafts, initial encounter: Secondary | ICD-10-CM | POA: Diagnosis not present

## 2016-11-27 DIAGNOSIS — T82848A Pain from vascular prosthetic devices, implants and grafts, initial encounter: Secondary | ICD-10-CM | POA: Diagnosis not present

## 2016-11-27 DIAGNOSIS — D509 Iron deficiency anemia, unspecified: Secondary | ICD-10-CM | POA: Diagnosis not present

## 2016-11-27 DIAGNOSIS — N2581 Secondary hyperparathyroidism of renal origin: Secondary | ICD-10-CM | POA: Diagnosis not present

## 2016-11-27 DIAGNOSIS — D631 Anemia in chronic kidney disease: Secondary | ICD-10-CM | POA: Diagnosis not present

## 2016-11-27 DIAGNOSIS — N186 End stage renal disease: Secondary | ICD-10-CM | POA: Diagnosis not present

## 2016-11-30 DIAGNOSIS — Z992 Dependence on renal dialysis: Secondary | ICD-10-CM | POA: Diagnosis not present

## 2016-11-30 DIAGNOSIS — D631 Anemia in chronic kidney disease: Secondary | ICD-10-CM | POA: Diagnosis not present

## 2016-11-30 DIAGNOSIS — T82848A Pain from vascular prosthetic devices, implants and grafts, initial encounter: Secondary | ICD-10-CM | POA: Diagnosis not present

## 2016-11-30 DIAGNOSIS — T82898A Other specified complication of vascular prosthetic devices, implants and grafts, initial encounter: Secondary | ICD-10-CM | POA: Diagnosis not present

## 2016-11-30 DIAGNOSIS — D509 Iron deficiency anemia, unspecified: Secondary | ICD-10-CM | POA: Diagnosis not present

## 2016-11-30 DIAGNOSIS — N186 End stage renal disease: Secondary | ICD-10-CM | POA: Diagnosis not present

## 2016-11-30 DIAGNOSIS — N2581 Secondary hyperparathyroidism of renal origin: Secondary | ICD-10-CM | POA: Diagnosis not present

## 2016-12-02 DIAGNOSIS — L97512 Non-pressure chronic ulcer of other part of right foot with fat layer exposed: Secondary | ICD-10-CM | POA: Diagnosis not present

## 2016-12-02 DIAGNOSIS — I96 Gangrene, not elsewhere classified: Secondary | ICD-10-CM | POA: Diagnosis not present

## 2016-12-02 DIAGNOSIS — Z992 Dependence on renal dialysis: Secondary | ICD-10-CM | POA: Diagnosis not present

## 2016-12-02 DIAGNOSIS — D631 Anemia in chronic kidney disease: Secondary | ICD-10-CM | POA: Diagnosis not present

## 2016-12-02 DIAGNOSIS — D509 Iron deficiency anemia, unspecified: Secondary | ICD-10-CM | POA: Diagnosis not present

## 2016-12-02 DIAGNOSIS — N2581 Secondary hyperparathyroidism of renal origin: Secondary | ICD-10-CM | POA: Diagnosis not present

## 2016-12-02 DIAGNOSIS — T82898A Other specified complication of vascular prosthetic devices, implants and grafts, initial encounter: Secondary | ICD-10-CM | POA: Diagnosis not present

## 2016-12-02 DIAGNOSIS — N186 End stage renal disease: Secondary | ICD-10-CM | POA: Diagnosis not present

## 2016-12-04 DIAGNOSIS — I96 Gangrene, not elsewhere classified: Secondary | ICD-10-CM | POA: Diagnosis not present

## 2016-12-04 DIAGNOSIS — N186 End stage renal disease: Secondary | ICD-10-CM | POA: Diagnosis not present

## 2016-12-04 DIAGNOSIS — D509 Iron deficiency anemia, unspecified: Secondary | ICD-10-CM | POA: Diagnosis not present

## 2016-12-04 DIAGNOSIS — T82898A Other specified complication of vascular prosthetic devices, implants and grafts, initial encounter: Secondary | ICD-10-CM | POA: Diagnosis not present

## 2016-12-04 DIAGNOSIS — D631 Anemia in chronic kidney disease: Secondary | ICD-10-CM | POA: Diagnosis not present

## 2016-12-04 DIAGNOSIS — L97512 Non-pressure chronic ulcer of other part of right foot with fat layer exposed: Secondary | ICD-10-CM | POA: Diagnosis not present

## 2016-12-07 DIAGNOSIS — D509 Iron deficiency anemia, unspecified: Secondary | ICD-10-CM | POA: Diagnosis not present

## 2016-12-07 DIAGNOSIS — T82898A Other specified complication of vascular prosthetic devices, implants and grafts, initial encounter: Secondary | ICD-10-CM | POA: Diagnosis not present

## 2016-12-07 DIAGNOSIS — I96 Gangrene, not elsewhere classified: Secondary | ICD-10-CM | POA: Diagnosis not present

## 2016-12-07 DIAGNOSIS — N186 End stage renal disease: Secondary | ICD-10-CM | POA: Diagnosis not present

## 2016-12-07 DIAGNOSIS — L97512 Non-pressure chronic ulcer of other part of right foot with fat layer exposed: Secondary | ICD-10-CM | POA: Diagnosis not present

## 2016-12-07 DIAGNOSIS — D631 Anemia in chronic kidney disease: Secondary | ICD-10-CM | POA: Diagnosis not present

## 2016-12-09 DIAGNOSIS — D509 Iron deficiency anemia, unspecified: Secondary | ICD-10-CM | POA: Diagnosis not present

## 2016-12-09 DIAGNOSIS — D631 Anemia in chronic kidney disease: Secondary | ICD-10-CM | POA: Diagnosis not present

## 2016-12-09 DIAGNOSIS — L97512 Non-pressure chronic ulcer of other part of right foot with fat layer exposed: Secondary | ICD-10-CM | POA: Diagnosis not present

## 2016-12-09 DIAGNOSIS — I96 Gangrene, not elsewhere classified: Secondary | ICD-10-CM | POA: Diagnosis not present

## 2016-12-09 DIAGNOSIS — N186 End stage renal disease: Secondary | ICD-10-CM | POA: Diagnosis not present

## 2016-12-09 DIAGNOSIS — T82898A Other specified complication of vascular prosthetic devices, implants and grafts, initial encounter: Secondary | ICD-10-CM | POA: Diagnosis not present

## 2016-12-11 DIAGNOSIS — I96 Gangrene, not elsewhere classified: Secondary | ICD-10-CM | POA: Diagnosis not present

## 2016-12-11 DIAGNOSIS — N186 End stage renal disease: Secondary | ICD-10-CM | POA: Diagnosis not present

## 2016-12-11 DIAGNOSIS — D509 Iron deficiency anemia, unspecified: Secondary | ICD-10-CM | POA: Diagnosis not present

## 2016-12-11 DIAGNOSIS — T82898A Other specified complication of vascular prosthetic devices, implants and grafts, initial encounter: Secondary | ICD-10-CM | POA: Diagnosis not present

## 2016-12-11 DIAGNOSIS — D631 Anemia in chronic kidney disease: Secondary | ICD-10-CM | POA: Diagnosis not present

## 2016-12-11 DIAGNOSIS — L97512 Non-pressure chronic ulcer of other part of right foot with fat layer exposed: Secondary | ICD-10-CM | POA: Diagnosis not present

## 2016-12-14 DIAGNOSIS — L97512 Non-pressure chronic ulcer of other part of right foot with fat layer exposed: Secondary | ICD-10-CM | POA: Diagnosis not present

## 2016-12-14 DIAGNOSIS — D631 Anemia in chronic kidney disease: Secondary | ICD-10-CM | POA: Diagnosis not present

## 2016-12-14 DIAGNOSIS — I96 Gangrene, not elsewhere classified: Secondary | ICD-10-CM | POA: Diagnosis not present

## 2016-12-14 DIAGNOSIS — N186 End stage renal disease: Secondary | ICD-10-CM | POA: Diagnosis not present

## 2016-12-14 DIAGNOSIS — T82898A Other specified complication of vascular prosthetic devices, implants and grafts, initial encounter: Secondary | ICD-10-CM | POA: Diagnosis not present

## 2016-12-14 DIAGNOSIS — D509 Iron deficiency anemia, unspecified: Secondary | ICD-10-CM | POA: Diagnosis not present

## 2016-12-16 DIAGNOSIS — D631 Anemia in chronic kidney disease: Secondary | ICD-10-CM | POA: Diagnosis not present

## 2016-12-16 DIAGNOSIS — I96 Gangrene, not elsewhere classified: Secondary | ICD-10-CM | POA: Diagnosis not present

## 2016-12-16 DIAGNOSIS — D509 Iron deficiency anemia, unspecified: Secondary | ICD-10-CM | POA: Diagnosis not present

## 2016-12-16 DIAGNOSIS — L97512 Non-pressure chronic ulcer of other part of right foot with fat layer exposed: Secondary | ICD-10-CM | POA: Diagnosis not present

## 2016-12-16 DIAGNOSIS — N186 End stage renal disease: Secondary | ICD-10-CM | POA: Diagnosis not present

## 2016-12-16 DIAGNOSIS — T82898A Other specified complication of vascular prosthetic devices, implants and grafts, initial encounter: Secondary | ICD-10-CM | POA: Diagnosis not present

## 2016-12-18 DIAGNOSIS — T82898A Other specified complication of vascular prosthetic devices, implants and grafts, initial encounter: Secondary | ICD-10-CM | POA: Diagnosis not present

## 2016-12-18 DIAGNOSIS — D509 Iron deficiency anemia, unspecified: Secondary | ICD-10-CM | POA: Diagnosis not present

## 2016-12-18 DIAGNOSIS — I96 Gangrene, not elsewhere classified: Secondary | ICD-10-CM | POA: Diagnosis not present

## 2016-12-18 DIAGNOSIS — N186 End stage renal disease: Secondary | ICD-10-CM | POA: Diagnosis not present

## 2016-12-18 DIAGNOSIS — L97512 Non-pressure chronic ulcer of other part of right foot with fat layer exposed: Secondary | ICD-10-CM | POA: Diagnosis not present

## 2016-12-18 DIAGNOSIS — D631 Anemia in chronic kidney disease: Secondary | ICD-10-CM | POA: Diagnosis not present

## 2016-12-21 DIAGNOSIS — L97512 Non-pressure chronic ulcer of other part of right foot with fat layer exposed: Secondary | ICD-10-CM | POA: Diagnosis not present

## 2016-12-21 DIAGNOSIS — D509 Iron deficiency anemia, unspecified: Secondary | ICD-10-CM | POA: Diagnosis not present

## 2016-12-21 DIAGNOSIS — I96 Gangrene, not elsewhere classified: Secondary | ICD-10-CM | POA: Diagnosis not present

## 2016-12-21 DIAGNOSIS — D631 Anemia in chronic kidney disease: Secondary | ICD-10-CM | POA: Diagnosis not present

## 2016-12-21 DIAGNOSIS — T82898A Other specified complication of vascular prosthetic devices, implants and grafts, initial encounter: Secondary | ICD-10-CM | POA: Diagnosis not present

## 2016-12-21 DIAGNOSIS — N186 End stage renal disease: Secondary | ICD-10-CM | POA: Diagnosis not present

## 2016-12-23 DIAGNOSIS — L97512 Non-pressure chronic ulcer of other part of right foot with fat layer exposed: Secondary | ICD-10-CM | POA: Diagnosis not present

## 2016-12-23 DIAGNOSIS — I96 Gangrene, not elsewhere classified: Secondary | ICD-10-CM | POA: Diagnosis not present

## 2016-12-23 DIAGNOSIS — T82898A Other specified complication of vascular prosthetic devices, implants and grafts, initial encounter: Secondary | ICD-10-CM | POA: Diagnosis not present

## 2016-12-23 DIAGNOSIS — D509 Iron deficiency anemia, unspecified: Secondary | ICD-10-CM | POA: Diagnosis not present

## 2016-12-23 DIAGNOSIS — N186 End stage renal disease: Secondary | ICD-10-CM | POA: Diagnosis not present

## 2016-12-23 DIAGNOSIS — D631 Anemia in chronic kidney disease: Secondary | ICD-10-CM | POA: Diagnosis not present

## 2016-12-24 DIAGNOSIS — E1029 Type 1 diabetes mellitus with other diabetic kidney complication: Secondary | ICD-10-CM | POA: Diagnosis not present

## 2016-12-24 DIAGNOSIS — Z6838 Body mass index (BMI) 38.0-38.9, adult: Secondary | ICD-10-CM | POA: Diagnosis not present

## 2016-12-24 DIAGNOSIS — I509 Heart failure, unspecified: Secondary | ICD-10-CM | POA: Diagnosis not present

## 2016-12-24 DIAGNOSIS — Z1389 Encounter for screening for other disorder: Secondary | ICD-10-CM | POA: Diagnosis not present

## 2016-12-24 DIAGNOSIS — N189 Chronic kidney disease, unspecified: Secondary | ICD-10-CM | POA: Diagnosis not present

## 2016-12-25 DIAGNOSIS — T82898A Other specified complication of vascular prosthetic devices, implants and grafts, initial encounter: Secondary | ICD-10-CM | POA: Diagnosis not present

## 2016-12-25 DIAGNOSIS — D631 Anemia in chronic kidney disease: Secondary | ICD-10-CM | POA: Diagnosis not present

## 2016-12-25 DIAGNOSIS — D509 Iron deficiency anemia, unspecified: Secondary | ICD-10-CM | POA: Diagnosis not present

## 2016-12-25 DIAGNOSIS — L97512 Non-pressure chronic ulcer of other part of right foot with fat layer exposed: Secondary | ICD-10-CM | POA: Diagnosis not present

## 2016-12-25 DIAGNOSIS — N186 End stage renal disease: Secondary | ICD-10-CM | POA: Diagnosis not present

## 2016-12-25 DIAGNOSIS — I96 Gangrene, not elsewhere classified: Secondary | ICD-10-CM | POA: Diagnosis not present

## 2016-12-28 ENCOUNTER — Encounter (HOSPITAL_COMMUNITY): Payer: Self-pay

## 2016-12-28 ENCOUNTER — Inpatient Hospital Stay (HOSPITAL_COMMUNITY)
Admission: EM | Admit: 2016-12-28 | Discharge: 2017-01-05 | DRG: 270 | Disposition: A | Discharge status: Skilled Nursing Facility | Payer: Medicare Other | Attending: Internal Medicine | Admitting: Internal Medicine

## 2016-12-28 ENCOUNTER — Emergency Department (HOSPITAL_COMMUNITY): Payer: Medicare Other

## 2016-12-28 DIAGNOSIS — M898X9 Other specified disorders of bone, unspecified site: Secondary | ICD-10-CM | POA: Diagnosis present

## 2016-12-28 DIAGNOSIS — I998 Other disorder of circulatory system: Secondary | ICD-10-CM | POA: Diagnosis not present

## 2016-12-28 DIAGNOSIS — I10 Essential (primary) hypertension: Secondary | ICD-10-CM | POA: Diagnosis present

## 2016-12-28 DIAGNOSIS — I428 Other cardiomyopathies: Secondary | ICD-10-CM | POA: Diagnosis present

## 2016-12-28 DIAGNOSIS — M6281 Muscle weakness (generalized): Secondary | ICD-10-CM | POA: Diagnosis not present

## 2016-12-28 DIAGNOSIS — E11621 Type 2 diabetes mellitus with foot ulcer: Secondary | ICD-10-CM | POA: Diagnosis present

## 2016-12-28 DIAGNOSIS — E782 Mixed hyperlipidemia: Secondary | ICD-10-CM | POA: Diagnosis not present

## 2016-12-28 DIAGNOSIS — K5900 Constipation, unspecified: Secondary | ICD-10-CM | POA: Diagnosis not present

## 2016-12-28 DIAGNOSIS — D631 Anemia in chronic kidney disease: Secondary | ICD-10-CM | POA: Diagnosis present

## 2016-12-28 DIAGNOSIS — I96 Gangrene, not elsewhere classified: Secondary | ICD-10-CM | POA: Diagnosis not present

## 2016-12-28 DIAGNOSIS — S98119A Complete traumatic amputation of unspecified great toe, initial encounter: Secondary | ICD-10-CM | POA: Diagnosis not present

## 2016-12-28 DIAGNOSIS — I739 Peripheral vascular disease, unspecified: Secondary | ICD-10-CM | POA: Diagnosis not present

## 2016-12-28 DIAGNOSIS — E1122 Type 2 diabetes mellitus with diabetic chronic kidney disease: Secondary | ICD-10-CM | POA: Diagnosis not present

## 2016-12-28 DIAGNOSIS — D509 Iron deficiency anemia, unspecified: Secondary | ICD-10-CM | POA: Diagnosis not present

## 2016-12-28 DIAGNOSIS — S98139A Complete traumatic amputation of one unspecified lesser toe, initial encounter: Secondary | ICD-10-CM | POA: Diagnosis not present

## 2016-12-28 DIAGNOSIS — I69331 Monoplegia of upper limb following cerebral infarction affecting right dominant side: Secondary | ICD-10-CM | POA: Diagnosis not present

## 2016-12-28 DIAGNOSIS — E11618 Type 2 diabetes mellitus with other diabetic arthropathy: Secondary | ICD-10-CM | POA: Diagnosis not present

## 2016-12-28 DIAGNOSIS — R2681 Unsteadiness on feet: Secondary | ICD-10-CM | POA: Diagnosis not present

## 2016-12-28 DIAGNOSIS — E119 Type 2 diabetes mellitus without complications: Secondary | ICD-10-CM | POA: Diagnosis not present

## 2016-12-28 DIAGNOSIS — Z833 Family history of diabetes mellitus: Secondary | ICD-10-CM

## 2016-12-28 DIAGNOSIS — Z01810 Encounter for preprocedural cardiovascular examination: Secondary | ICD-10-CM

## 2016-12-28 DIAGNOSIS — M1990 Unspecified osteoarthritis, unspecified site: Secondary | ICD-10-CM | POA: Diagnosis present

## 2016-12-28 DIAGNOSIS — E871 Hypo-osmolality and hyponatremia: Secondary | ICD-10-CM | POA: Diagnosis present

## 2016-12-28 DIAGNOSIS — K219 Gastro-esophageal reflux disease without esophagitis: Secondary | ICD-10-CM | POA: Diagnosis present

## 2016-12-28 DIAGNOSIS — Z821 Family history of blindness and visual loss: Secondary | ICD-10-CM | POA: Diagnosis not present

## 2016-12-28 DIAGNOSIS — E1152 Type 2 diabetes mellitus with diabetic peripheral angiopathy with gangrene: Principal | ICD-10-CM | POA: Diagnosis present

## 2016-12-28 DIAGNOSIS — Z794 Long term (current) use of insulin: Secondary | ICD-10-CM | POA: Diagnosis not present

## 2016-12-28 DIAGNOSIS — R569 Unspecified convulsions: Secondary | ICD-10-CM | POA: Diagnosis not present

## 2016-12-28 DIAGNOSIS — I70261 Atherosclerosis of native arteries of extremities with gangrene, right leg: Secondary | ICD-10-CM | POA: Diagnosis not present

## 2016-12-28 DIAGNOSIS — Z8249 Family history of ischemic heart disease and other diseases of the circulatory system: Secondary | ICD-10-CM | POA: Diagnosis not present

## 2016-12-28 DIAGNOSIS — E875 Hyperkalemia: Secondary | ICD-10-CM

## 2016-12-28 DIAGNOSIS — L03115 Cellulitis of right lower limb: Secondary | ICD-10-CM

## 2016-12-28 DIAGNOSIS — D638 Anemia in other chronic diseases classified elsewhere: Secondary | ICD-10-CM | POA: Diagnosis not present

## 2016-12-28 DIAGNOSIS — R278 Other lack of coordination: Secondary | ICD-10-CM | POA: Diagnosis not present

## 2016-12-28 DIAGNOSIS — Z7982 Long term (current) use of aspirin: Secondary | ICD-10-CM | POA: Diagnosis not present

## 2016-12-28 DIAGNOSIS — T82898A Other specified complication of vascular prosthetic devices, implants and grafts, initial encounter: Secondary | ICD-10-CM | POA: Diagnosis not present

## 2016-12-28 DIAGNOSIS — Z992 Dependence on renal dialysis: Secondary | ICD-10-CM

## 2016-12-28 DIAGNOSIS — Z89431 Acquired absence of right foot: Secondary | ICD-10-CM | POA: Diagnosis not present

## 2016-12-28 DIAGNOSIS — N186 End stage renal disease: Secondary | ICD-10-CM

## 2016-12-28 DIAGNOSIS — Z91030 Bee allergy status: Secondary | ICD-10-CM

## 2016-12-28 DIAGNOSIS — G4089 Other seizures: Secondary | ICD-10-CM | POA: Diagnosis not present

## 2016-12-28 DIAGNOSIS — Z880 Allergy status to penicillin: Secondary | ICD-10-CM | POA: Diagnosis not present

## 2016-12-28 DIAGNOSIS — E1121 Type 2 diabetes mellitus with diabetic nephropathy: Secondary | ICD-10-CM | POA: Diagnosis not present

## 2016-12-28 DIAGNOSIS — L97519 Non-pressure chronic ulcer of other part of right foot with unspecified severity: Secondary | ICD-10-CM | POA: Diagnosis present

## 2016-12-28 DIAGNOSIS — R269 Unspecified abnormalities of gait and mobility: Secondary | ICD-10-CM | POA: Diagnosis not present

## 2016-12-28 DIAGNOSIS — R55 Syncope and collapse: Secondary | ICD-10-CM | POA: Diagnosis not present

## 2016-12-28 DIAGNOSIS — D649 Anemia, unspecified: Secondary | ICD-10-CM | POA: Diagnosis not present

## 2016-12-28 DIAGNOSIS — L97512 Non-pressure chronic ulcer of other part of right foot with fat layer exposed: Secondary | ICD-10-CM | POA: Diagnosis not present

## 2016-12-28 DIAGNOSIS — I120 Hypertensive chronic kidney disease with stage 5 chronic kidney disease or end stage renal disease: Secondary | ICD-10-CM | POA: Diagnosis not present

## 2016-12-28 DIAGNOSIS — R5381 Other malaise: Secondary | ICD-10-CM | POA: Diagnosis not present

## 2016-12-28 DIAGNOSIS — N185 Chronic kidney disease, stage 5: Secondary | ICD-10-CM

## 2016-12-28 DIAGNOSIS — E876 Hypokalemia: Secondary | ICD-10-CM | POA: Diagnosis present

## 2016-12-28 DIAGNOSIS — Z8631 Personal history of diabetic foot ulcer: Secondary | ICD-10-CM | POA: Diagnosis not present

## 2016-12-28 DIAGNOSIS — H409 Unspecified glaucoma: Secondary | ICD-10-CM | POA: Diagnosis present

## 2016-12-28 DIAGNOSIS — M7989 Other specified soft tissue disorders: Secondary | ICD-10-CM | POA: Diagnosis not present

## 2016-12-28 DIAGNOSIS — N2581 Secondary hyperparathyroidism of renal origin: Secondary | ICD-10-CM | POA: Diagnosis not present

## 2016-12-28 DIAGNOSIS — N493 Fournier gangrene: Secondary | ICD-10-CM | POA: Diagnosis not present

## 2016-12-28 LAB — COMPREHENSIVE METABOLIC PANEL
ALK PHOS: 141 U/L — AB (ref 38–126)
ALT: 5 U/L — ABNORMAL LOW (ref 17–63)
AST: 18 U/L (ref 15–41)
Albumin: 2.9 g/dL — ABNORMAL LOW (ref 3.5–5.0)
Anion gap: 11 (ref 5–15)
BILIRUBIN TOTAL: 0.4 mg/dL (ref 0.3–1.2)
BUN: 22 mg/dL — ABNORMAL HIGH (ref 6–20)
CO2: 28 mmol/L (ref 22–32)
Calcium: 8.5 mg/dL — ABNORMAL LOW (ref 8.9–10.3)
Chloride: 93 mmol/L — ABNORMAL LOW (ref 101–111)
Creatinine, Ser: 5.87 mg/dL — ABNORMAL HIGH (ref 0.61–1.24)
GFR calc non Af Amer: 9 mL/min — ABNORMAL LOW (ref >60)
GFR, EST AFRICAN AMERICAN: 10 mL/min — AB (ref >60)
Glucose, Bld: 146 mg/dL — ABNORMAL HIGH (ref 65–99)
POTASSIUM: 3.3 mmol/L — AB (ref 3.5–5.1)
Sodium: 132 mmol/L — ABNORMAL LOW (ref 135–145)
TOTAL PROTEIN: 9 g/dL — AB (ref 6.5–8.1)

## 2016-12-28 LAB — CBC WITH DIFFERENTIAL/PLATELET
BASOS ABS: 0.1 10*3/uL (ref 0.0–0.1)
BASOS PCT: 1 %
Eosinophils Absolute: 1 10*3/uL — ABNORMAL HIGH (ref 0.0–0.7)
Eosinophils Relative: 13 %
HEMATOCRIT: 33.7 % — AB (ref 39.0–52.0)
HEMOGLOBIN: 10.4 g/dL — AB (ref 13.0–17.0)
Lymphocytes Relative: 12 %
Lymphs Abs: 1 10*3/uL (ref 0.7–4.0)
MCH: 28.8 pg (ref 26.0–34.0)
MCHC: 30.9 g/dL (ref 30.0–36.0)
MCV: 93.4 fL (ref 78.0–100.0)
MONO ABS: 0.5 10*3/uL (ref 0.1–1.0)
Monocytes Relative: 6 %
NEUTROS ABS: 5.6 10*3/uL (ref 1.7–7.7)
NEUTROS PCT: 68 %
Platelets: 335 10*3/uL (ref 150–400)
RBC: 3.61 MIL/uL — ABNORMAL LOW (ref 4.22–5.81)
RDW: 15.7 % — AB (ref 11.5–15.5)
WBC: 8.1 10*3/uL (ref 4.0–10.5)

## 2016-12-28 LAB — GLUCOSE, CAPILLARY: Glucose-Capillary: 170 mg/dL — ABNORMAL HIGH (ref 65–99)

## 2016-12-28 LAB — SEDIMENTATION RATE: SED RATE: 102 mm/h — AB (ref 0–16)

## 2016-12-28 MED ORDER — CIPROFLOXACIN IN D5W 400 MG/200ML IV SOLN
400.0000 mg | INTRAVENOUS | Status: DC
  Administered 2016-12-28: 400 mg via INTRAVENOUS
  Filled 2016-12-28: qty 200

## 2016-12-28 MED ORDER — INSULIN ASPART 100 UNIT/ML ~~LOC~~ SOLN
0.0000 [IU] | Freq: Three times a day (TID) | SUBCUTANEOUS | Status: DC
  Administered 2016-12-29 (×2): 2 [IU] via SUBCUTANEOUS

## 2016-12-28 MED ORDER — CINACALCET HCL 30 MG PO TABS
30.0000 mg | ORAL_TABLET | Freq: Every day | ORAL | Status: DC
  Administered 2016-12-29 – 2017-01-05 (×6): 30 mg via ORAL
  Filled 2016-12-28 (×6): qty 1

## 2016-12-28 MED ORDER — VANCOMYCIN HCL 1000 MG IV SOLR
1500.0000 mg | INTRAVENOUS | Status: DC
  Administered 2016-12-30: 1500 mg via INTRAVENOUS
  Filled 2016-12-28 (×2): qty 1500

## 2016-12-28 MED ORDER — HYDROCODONE-ACETAMINOPHEN 5-325 MG PO TABS
1.0000 | ORAL_TABLET | ORAL | Status: DC | PRN
  Administered 2016-12-29 (×2): 1 via ORAL
  Administered 2016-12-30 – 2017-01-01 (×5): 2 via ORAL
  Administered 2017-01-01: 1 via ORAL
  Administered 2017-01-02 – 2017-01-04 (×2): 2 via ORAL
  Filled 2016-12-28: qty 1
  Filled 2016-12-28 (×8): qty 2
  Filled 2016-12-28: qty 1

## 2016-12-28 MED ORDER — ACETAMINOPHEN 650 MG RE SUPP: 650.0000 mg | Freq: Four times a day (QID) | RECTAL | Status: DC | PRN

## 2016-12-28 MED ORDER — ONDANSETRON HCL 4 MG/2ML IJ SOLN: 4.0000 mg | Freq: Four times a day (QID) | INTRAMUSCULAR | Status: DC | PRN

## 2016-12-28 MED ORDER — METRONIDAZOLE IN NACL 5-0.79 MG/ML-% IV SOLN
500.0000 mg | Freq: Three times a day (TID) | INTRAVENOUS | Status: DC
  Administered 2016-12-28 – 2017-01-02 (×11): 500 mg via INTRAVENOUS
  Filled 2016-12-28 (×12): qty 100

## 2016-12-28 MED ORDER — DEXTROSE 5 % IV SOLN
500.0000 mg | Freq: Two times a day (BID) | INTRAVENOUS | Status: DC
  Administered 2016-12-29 – 2016-12-31 (×7): 500 mg via INTRAVENOUS
  Administered 2017-01-01: .5 g via INTRAVENOUS
  Administered 2017-01-01 – 2017-01-02 (×2): 500 mg via INTRAVENOUS
  Filled 2016-12-28 (×13): qty 0.5

## 2016-12-28 MED ORDER — HEPARIN SODIUM (PORCINE) 5000 UNIT/ML IJ SOLN
5000.0000 [IU] | Freq: Three times a day (TID) | INTRAMUSCULAR | Status: DC
  Administered 2016-12-28 – 2016-12-29 (×3): 5000 [IU] via SUBCUTANEOUS
  Filled 2016-12-28 (×3): qty 1

## 2016-12-28 MED ORDER — LORATADINE 10 MG PO TABS
10.0000 mg | ORAL_TABLET | Freq: Every day | ORAL | Status: DC | PRN
  Administered 2016-12-30 – 2017-01-03 (×5): 10 mg via ORAL
  Filled 2016-12-28 (×6): qty 1

## 2016-12-28 MED ORDER — RENA-VITE PO TABS
1.0000 | ORAL_TABLET | ORAL | Status: DC
  Administered 2017-01-02: 1 via ORAL
  Filled 2016-12-28 (×2): qty 1

## 2016-12-28 MED ORDER — ACETAMINOPHEN 325 MG PO TABS: 650.0000 mg | ORAL_TABLET | Freq: Four times a day (QID) | ORAL | Status: DC | PRN

## 2016-12-28 MED ORDER — PANTOPRAZOLE SODIUM 40 MG PO TBEC
40.0000 mg | DELAYED_RELEASE_TABLET | Freq: Every day | ORAL | Status: DC
  Administered 2016-12-29 – 2017-01-05 (×6): 40 mg via ORAL
  Filled 2016-12-28 (×6): qty 1

## 2016-12-28 MED ORDER — SODIUM CHLORIDE 0.9 % IV SOLN: 250.0000 mL | INTRAVENOUS | Status: DC | PRN

## 2016-12-28 MED ORDER — INSULIN ASPART 100 UNIT/ML ~~LOC~~ SOLN: 0.0000 [IU] | Freq: Every day | SUBCUTANEOUS | Status: DC

## 2016-12-28 MED ORDER — SODIUM CHLORIDE 0.9% FLUSH: 3.0000 mL | INTRAVENOUS | Status: DC | PRN

## 2016-12-28 MED ORDER — POLYETHYLENE GLYCOL 3350 17 G PO PACK
17.0000 g | PACK | Freq: Every day | ORAL | Status: DC | PRN
  Administered 2016-12-31: 17 g via ORAL
  Filled 2016-12-28 (×2): qty 1

## 2016-12-28 MED ORDER — SODIUM CHLORIDE 0.9% FLUSH
3.0000 mL | Freq: Two times a day (BID) | INTRAVENOUS | Status: DC
  Administered 2016-12-28: 3 mL via INTRAVENOUS

## 2016-12-28 MED ORDER — CARVEDILOL 6.25 MG PO TABS
6.2500 mg | ORAL_TABLET | Freq: Two times a day (BID) | ORAL | Status: DC
  Administered 2016-12-28 – 2017-01-05 (×13): 6.25 mg via ORAL
  Filled 2016-12-28: qty 2
  Filled 2016-12-28 (×3): qty 1
  Filled 2016-12-28: qty 2
  Filled 2016-12-28: qty 1
  Filled 2016-12-28: qty 2
  Filled 2016-12-28 (×7): qty 1

## 2016-12-28 MED ORDER — SEVELAMER CARBONATE 800 MG PO TABS
2400.0000 mg | ORAL_TABLET | Freq: Three times a day (TID) | ORAL | Status: DC
  Administered 2016-12-29 – 2017-01-05 (×14): 2400 mg via ORAL
  Filled 2016-12-28 (×17): qty 3

## 2016-12-28 MED ORDER — MORPHINE SULFATE (PF) 4 MG/ML IV SOLN
4.0000 mg | Freq: Once | INTRAVENOUS | Status: AC
  Administered 2016-12-28: 4 mg via INTRAVENOUS
  Filled 2016-12-28: qty 1

## 2016-12-28 MED ORDER — ONDANSETRON HCL 4 MG PO TABS: 4.0000 mg | ORAL_TABLET | Freq: Four times a day (QID) | ORAL | Status: DC | PRN

## 2016-12-28 MED ORDER — BISACODYL 5 MG PO TBEC
5.0000 mg | DELAYED_RELEASE_TABLET | Freq: Every day | ORAL | Status: DC | PRN
  Administered 2016-12-31: 5 mg via ORAL
  Filled 2016-12-28: qty 1

## 2016-12-28 MED ORDER — INSULIN GLARGINE 100 UNIT/ML ~~LOC~~ SOLN
20.0000 [IU] | Freq: Every day | SUBCUTANEOUS | Status: DC
  Filled 2016-12-28: qty 0.2

## 2016-12-28 NOTE — ED Notes (Signed)
Admitting Provider at bedside. 

## 2016-12-28 NOTE — ED Notes (Signed)
ED Provider at bedside. 

## 2016-12-28 NOTE — ED Triage Notes (Signed)
Sent here today from dialysis for diabetic foot problem. Per paper work patient received Vancomycin 1500mg  IV. Patient reports of foot not healing.

## 2016-12-28 NOTE — Progress Notes (Addendum)
Pharmacy Antibiotic Note  Jacob [name] is a 66 y.o. male admitted on 12/28/2016 with cellulitis.  Pharmacy has been consulted for vancomycin and aztreonam dosing. He received vancomycin earlier today at HD  Plan: Aztreonam 500 mg IV q12 hours Vancomycin 1500 mg after each HD F/u cultures and clinical course  Height: 6\' 2"  (188 cm) Weight: (!) 305 lb (138.3 kg) IBW/kg (Calculated) : 82.2  Temp (24hrs), Avg:97.8 F (36.6 C), Min:97.8 F (36.6 C), Max:97.8 F (36.6 C)  No results for input(s): WBC, CREATININE, LATICACIDVEN, VANCOTROUGH, VANCOPEAK, VANCORANDOM, GENTTROUGH, GENTPEAK, GENTRANDOM, TOBRATROUGH, TOBRAPEAK, TOBRARND, AMIKACINPEAK, AMIKACINTROU, AMIKACIN in the last 168 hours.  CrCl cannot be calculated (Patient's most recent lab result is older than the maximum 21 days allowed.).    Allergies  Allergen Reactions  . Bee Venom Anaphylaxis  . Penicillins Swelling and Other (See Comments)    SWELLING REACTION UNSPECIFIED   Has patient had a PCN reaction causing immediate rash, facial/tongue/throat swelling, SOB or lightheadedness with hypotension: No Has patient had a PCN reaction causing severe rash involving mucus membranes or skin necrosis: No Has patient had a PCN reaction that required hospitalization No Has patient had a PCN reaction occurring within the last 10 years: No If all of the above answers are "NO", then may proceed with Cephalosporin use.      Thank you for allowing pharmacy to be a part of this patient's care.  Excell Seltzer Poteet 12/28/2016 6:10 PM

## 2016-12-28 NOTE — ED Notes (Signed)
Report attempted 

## 2016-12-28 NOTE — H&P (Signed)
History and Physical    Jacob [name]:[id number] DOB: 1950/09/21 DOA: 12/28/2016  PCP: Sharilyn Sites, MD   Patient coming from: Home  Chief Complaint: Right foot wound with drainage and foul odor  HPI: Jacob [name] is a 66 y.o. male with medical history significant for hypertension, insulin-dependent diabetes mellitus, chronic normocytic anemia, and end-stage renal disease on hemodialysis, now presenting to the emergency department for evaluation of right foot wound with purulent drainage and foul odor. Patient reports that he noted a small sore on the second right toe several months ago and states that it has slowly, but progressively worsened since that time. This was noted by someone at the dialysis center and he received vancomycin with HD for the past week. Despite this, there continues to be purulent discharge and foul odor from the foot and newer atrophy of the second toe as well as darkening of the forefoot. [name] denies fevers or chills, denies chest pain or palpitations, and denies any increase in his chronic dyspnea. He has not been attempting any interventions for these symptoms, and actually denies any pain from the foot.  ED Course: Upon arrival to the ED, patient is found to be afebrile, saturating adequately on room air, hypertensive to 195/86, and with vitals otherwise stable. Chemistry panels notable for mild hyponatremia and hypokalemia and CBC features a normocytic anemia with hemoglobin of 10.4. Radiographs of the right foot demonstrates soft tissue ulceration over the second and third digits but no underlying cortical destruction is noted. Patient was treated with morphine and ciprofloxacin in the emergency department. He remained hypertensive, but otherwise stable and will be admitted to the medical-surgical unit per ongoing evaluation and management of diabetic foot wound with gangrene.  Review of Systems:  All other systems reviewed and apart from HPI, are  negative.  Past Medical History:  Diagnosis Date  . Anemia   . Anemia in chronic kidney disease(285.21)   . Anxiety   . Arthritis   . Depression   . Dyspnea    with exertion  . End stage renal disease ([place])    Hemodialysis on Monday, Wednesday, Friday- in [place]  . Essential hypertension   . GERD (gastroesophageal reflux disease)   . Glaucoma   . History of blood transfusion   . Insomnia   . Nonischemic cardiomyopathy ([place])   . Stroke [facility]) 2016   - "they said it was not a stroke"  . Type 2 diabetes mellitus ([place])    Type II    Past Surgical History:  Procedure Laterality Date  . A/V SHUNTOGRAM Right 10/06/2016   Procedure: A/V Fistulagram;  Surgeon: [name], MD;  Location: [name];  Service: Cardiovascular;  Laterality: Right;  . AV FISTULA PLACEMENT     Hx: of  . AV FISTULA PLACEMENT Right 05/04/2013   Procedure: ARTERIOVENOUS (AV) FISTULA CREATION- RIGHT ARM;  Surgeon: [name], MD;  Location: [place];  Service: Vascular;  Laterality: Right;  Ultrasound guided  . AV FISTULA PLACEMENT Right 07/28/2016   Procedure: BRACHIOCEPHALIC ARTERIOVENOUS (AV) FISTULA CREATION;  Surgeon: [name], MD;  Location: [place];  Service: Vascular;  Laterality: Right;  . CATARACT EXTRACTION W/PHACO Left 07/16/2014   Procedure: CATARACT EXTRACTION PHACO AND INTRAOCULAR LENS PLACEMENT (IOC);  Surgeon: [name], MD;  Location: [facility];  Service: Ophthalmology;  Laterality: Left;  CDE:8.86  . CATARACT EXTRACTION W/PHACO Right 07/30/2014   Procedure: CATARACT EXTRACTION PHACO AND INTRAOCULAR LENS PLACEMENT (IOC);  Surgeon:  [name], MD;  Location: [facility];  Service: Ophthalmology;  Laterality: Right;  CDE 8.99  . COLONOSCOPY     Hx: of  . FISTULA SUPERFICIALIZATION Right 07/28/2016   Procedure: FISTULA SUPERFICIALIZATION;  Surgeon: [name], MD;  Location: [place];  Service: Vascular;  Laterality: Right;  . FISTULA SUPERFICIALIZATION Right 10/13/2016    Procedure: BRACHIOCEPHALIC ARTERIOVENOUS FISTULA SUPERFICIALIZATION;  Surgeon: [name], MD;  Location: [place];  Service: Vascular;  Laterality: Right;  . FISTULOGRAM N/A 05/04/2013   Procedure: CENTRAL VENOGRAM;  Surgeon: [name], MD;  Location: [place];  Service: Vascular;  Laterality: N/A;  . INSERTION OF DIALYSIS CATHETER Right 05/04/2013   Procedure: INSERTION OF DIALYSIS CATHETER;  Surgeon: [name], MD;  Location: [place];  Service: Vascular;  Laterality: Right;  Ultrasound guided  . INSERTION OF DIALYSIS CATHETER N/A 07/28/2016   Procedure: INSERTION OF DIALYSIS CATHETER;  Surgeon: [name], MD;  Location: [place];  Service: Vascular;  Laterality: N/A;  . LIGATION OF ARTERIOVENOUS  FISTULA Left 05/04/2013   Procedure: LIGATION OF LEFT RADIAL CEPHALIC ARTERIOVENOUS  FISTULA;  Surgeon: [name], MD;  Location: [place];  Service: Vascular;  Laterality: Left;  Ultrasound guided  . LIGATION OF ARTERIOVENOUS  FISTULA Right 07/28/2016   Procedure: LIGATION OF RADIOCEPHALIC ARTERIOVENOUS  FISTULA;  Surgeon: [name], MD;  Location: [place];  Service: Vascular;  Laterality: Right;  . LUMBAR SPINE SURGERY    . PERIPHERAL VASCULAR CATHETERIZATION N/A 01/24/2015   Procedure: Fistulagram;  Surgeon: [name], MD;  Location: [place] [facility];  Service: Cardiovascular;  Laterality: N/A;  . PERIPHERAL VASCULAR CATHETERIZATION Right 06/04/2016   Procedure: A/V Shuntogram/Fistulagram;  Surgeon: [name], MD;  Location: [place] [facility];  Service: Cardiovascular;  Laterality: Right;  . REVISON OF ARTERIOVENOUS FISTULA Right 01/02/2014   Procedure: REVISON OF ARTERIOVENOUS FISTULA ANASTOMOSIS;  Surgeon: [name], MD;  Location: [place];  Service: Vascular;  Laterality: Right;  . REVISON OF ARTERIOVENOUS FISTULA Right 01/02/2016   Procedure: REVISION OF RADIOCEPHALIC ARTERIOVENOUS FISTULA  with BOVINE PATCH ANGIOPLASTY RIGHT RADIAL ARTERY;  Surgeon: Mal [name],  MD;  Location: [place];  Service: Vascular;  Laterality: Right;  . SHUNTOGRAM N/A 11/07/2013   Procedure: Fistulogram;  Surgeon: [name], MD;  Location: [facility];  Service: Cardiovascular;  Laterality: N/A;     reports that he has never smoked. He has never used smokeless tobacco. He reports that he drinks alcohol. He reports that he does not use drugs.  Allergies  Allergen Reactions  . Bee Venom Anaphylaxis  . Penicillins Swelling and Other (See Comments)    SWELLING REACTION UNSPECIFIED   Has patient had a PCN reaction causing immediate rash, facial/tongue/throat swelling, SOB or lightheadedness with hypotension: No Has patient had a PCN reaction causing severe rash involving mucus membranes or skin necrosis: No Has patient had a PCN reaction that required hospitalization No Has patient had a PCN reaction occurring within the last 10 years: No If all of the above answers are "NO", then may proceed with Cephalosporin use.    Family History  Problem Relation Age of Onset  . Heart attack Brother        62  . Heart disease Brother        before age 63  . Hypertension Brother   . Heart attack Brother        41  . Heart attack Brother  83  . Diabetes Mother   . Hypertension Mother   . Heart disease Mother   . Heart disease Father   . Hypertension Father   . Other Father        amputation  . Hypertension Sister   . Heart disease Sister   . Vision loss Maternal Uncle      Prior to Admission medications   Medication Sig Start Date End Date Taking? Authorizing Provider  acetaminophen (TYLENOL) 500 MG tablet Take 1,000 mg by mouth every 6 (six) hours as needed for mild pain.   Yes [provider]  aspirin EC 325 MG tablet Take 325 mg by mouth daily.   Yes [provider]  B Complex-C-Folic Acid (DIALYVITE TABLET) TABS Take 1 tablet by mouth every Monday, Wednesday, and Friday.    Yes [provider]  carvedilol (COREG) 6.25 MG tablet  Take 6.25 mg by mouth 2 (two) times daily with a meal. Take 6.5mg s once daily on dialysis days, Mondays, Wednesdays, and Fridays   Yes [provider]  cinacalcet (SENSIPAR) 30 MG tablet Take 30 mg by mouth daily.   Yes [provider]  EPIPEN 2-PAK 0.3 MG/0.3ML SOAJ injection Inject 0.3 mg into the muscle once as needed (For anaphylaxis.).  04/18/13  Yes [provider]  LANTUS SOLOSTAR 100 UNIT/ML Solostar Pen Inject 20 Units into the skin at bedtime. If blood sugar is lower than 150 only inject 20 units at bedtime 01/15/15  Yes [provider]  loratadine (CLARITIN) 10 MG tablet Take 10 mg by mouth daily as needed for allergies.   Yes [provider]  omeprazole (PRILOSEC) 40 MG capsule Take 40 mg by mouth daily.    Yes [provider]  ramipril (ALTACE) 10 MG capsule Take 10 mg by mouth daily.    Yes [provider]  RENVELA 800 MG tablet Take 800-2,400 mg by mouth 3 (three) times daily with meals. Take 2400 mg by mouth 3 times daily with meals and 800 mg by mouth with snacks 05/25/13  Yes [provider]    Physical Exam: Vitals:   12/28/16 1410 12/28/16 1756  BP:  (!) 195/86  Pulse: 66 80  Resp: 18 20  Temp: 97.8 F (36.6 C)   TempSrc: Oral   SpO2: 100% 99%  Weight: (!) 138.3 kg (305 lb)   Height: 6\' 2"  (1.88 m)       Constitutional: No acute distress, obese, chronically-ill appearing.  Eyes: PERTLA, upper palpebral nodule noted on the right. ENMT: Mucous membranes are moist. Posterior pharynx clear of any exudate or lesions.   Neck: normal, supple, no masses, no thyromegaly Respiratory: Slightly diminished bilaterally, no wheezing, no crackles. Normal respiratory effort.  Cardiovascular: S1 & S2 heard, regular rate and rhythm. No significant JVD. Abdomen: No distension, no tenderness, no masses palpated. Bowel sounds normal.  Musculoskeletal: no clubbing / cyanosis. No joint deformity upper and lower  extremities.  Skin: Ulcerations involving 1st-3rd right toes with dry gangrenous changes to 1st and 2nd digits, with wet gangrene involving 3rd toe; forefoot is dark and dusky. Left foot with eschar at multiple phalanges, but no acute inflammatory/infectious change. Warm, dry, well-perfused. Neurologic: CN 2-12 grossly intact. Sensation diminished in bilateral feet. Strength 5/5 in all 4 limbs.  Psychiatric: Alert and oriented x 3. Calm and cooperative.     Labs on Admission: I have personally reviewed following labs and imaging studies  CBC:  Recent Labs Lab 12/28/16 1836  WBC 8.1  NEUTROABS 5.6  HGB 10.4*  HCT 33.7*  MCV 93.4  PLT 412   Basic Metabolic Panel:  Recent Labs Lab 12/28/16 1836  NA 132*  K 3.3*  CL 93*  CO2 28  GLUCOSE 146*  BUN 22*  CREATININE 5.87*  CALCIUM 8.5*   GFR: Estimated Creatinine Clearance: 18.1 mL/min (A) (by C-G formula based on SCr of 5.87 mg/dL (H)). Liver Function Tests:  Recent Labs Lab 12/28/16 1836  AST 18  ALT 5*  ALKPHOS 141*  BILITOT 0.4  PROT 9.0*  ALBUMIN 2.9*   No results for input(s): LIPASE, AMYLASE in the last 168 hours. No results for input(s): AMMONIA in the last 168 hours. Coagulation Profile: No results for input(s): INR, PROTIME in the last 168 hours. Cardiac Enzymes: No results for input(s): CKTOTAL, CKMB, CKMBINDEX, TROPONINI in the last 168 hours. BNP (last 3 results) No results for input(s): PROBNP in the last 8760 hours. HbA1C: No results for input(s): HGBA1C in the last 72 hours. CBG: No results for input(s): GLUCAP in the last 168 hours. Lipid Profile: No results for input(s): CHOL, HDL, LDLCALC, TRIG, CHOLHDL, LDLDIRECT in the last 72 hours. Thyroid Function Tests: No results for input(s): TSH, T4TOTAL, FREET4, T3FREE, THYROIDAB in the last 72 hours. Anemia Panel: No results for input(s): VITAMINB12, FOLATE, FERRITIN, TIBC, IRON, RETICCTPCT in the last 72 hours. Urine analysis: No results found  for: COLORURINE, APPEARANCEUR, LABSPEC, PHURINE, GLUCOSEU, HGBUR, BILIRUBINUR, KETONESUR, PROTEINUR, UROBILINOGEN, NITRITE, LEUKOCYTESUR Sepsis Labs: @LABRCNTIP (procalcitonin:4,lacticidven:4) )No results found for this or any previous visit (from the past 240 hour(s)).   Radiological Exams on Admission: Dg Foot Complete Right  Result Date: 12/28/2016 CLINICAL DATA:  Right foot swelling, peripheral vascular disease, necrotic 1st and 2nd toe EXAM: RIGHT FOOT COMPLETE - 3+ VIEW COMPARISON:  None. FINDINGS: No fracture or dislocation is seen. The joint spaces are preserved. Soft tissue ulceration overlying the distal 2nd and 3rd digits. No underlying cortical destruction. IMPRESSION: Soft tissue ulceration overlying the distal 2nd and 3rd digits. No underlying cortical destruction. Electronically Signed   By: Julian Hy M.D.   On: 12/28/2016 19:51    EKG: Not performed.   Assessment/Plan  1. Diabetic foot ulcers with gangrene  - Pt noted a small wound to the 2nd right toe several months and reports progressive worsening  - He now presents with apparent dry and wet gangrene at the 1-3rd digits and cellulitis extending over forefoot; left foot has eschar at some toes without acute infection - Pulses obtained by doppler only; plain radiographs negative for osteomyelitis  - Afebrile and no leukocytosis on presentation, but has been on vanc for the past week with HD  - Plan to trend inflammatory markers, request vascular consult, wound care consultation, start empiric aztreonam, Flagyl, and vanc in setting of diabetic on HD with severe penicillin allergy  - Anticipate he will likely lose the forefoot at least; hold ASA and ACEi should surgery be necessary this admission   2. ESRD  - Pt presents from dialysis center where he reports completing the full session  - Dialyzes via catheter in right chest  - No hyperkalemia, acidosis, or vol o/l  - SLIV, renal diet, continue binders  - If remains  inpatient on 5/30, he will be due for regular maintenance HD   3. Insulin-dependent DM  - No A1c on file  - Managed at home with Lantus 20 qHS  - Check CBG with meals and qHS - Continue Lantus 20 qHS with low-intensity SSI  4. Hypertension   - BP is elevated in ED, normalized with analgesia  - Continue Coreg, hold ramipril     DVT prophylaxis: sq heparin  Code Status: Full  Family Communication: Sister updated at bedside with patient's permission  Disposition Plan: Admit to med-surg Consults called: Vascular consult requested   Admission status: Inpatient    Vianne Bulls, MD [facility] Pager [phone]  If 7PM-7AM, please contact night-coverage [email or web address] Password [facility]  12/28/2016, 8:51 PM

## 2016-12-28 NOTE — ED Notes (Signed)
Unable to access an IV on 2nd attempt. Pt. States he usually has to have an Ultrasound IV done. Will consult RN's who are approved to do ultrasound IV's

## 2016-12-28 NOTE — ED Provider Notes (Signed)
[facility] DEPT Provider Note   CSN: [id number] Arrival date & time: 12/28/16  1331     History   Chief Complaint Chief Complaint  Patient presents with  . Nail Problem    HPI Jacob [name] is a 66 y.o. male.  HPI Patient presents to the emergency room for evaluation of a foot infection. She has history of diabetes as well as end-stage renal disease. Patient states for the last few months he has had issues with his first and second toes right foot. Patient states he's had a wound on his toes. His toenails eventually fell off. He said persistent drainage and recently he noticed a foul odor. He does complain of some tenderness in his foot. He denies any fevers or chills. Patient states that he received antibiotics last 2 times he was at dialysis. He had a treatment today. He was sent to the emergency room because of the worsening symptoms of his foot infection. Past Medical History:  Diagnosis Date  . Anemia   . Anemia in chronic kidney disease(285.21)   . Anxiety   . Arthritis   . Depression   . Dyspnea    with exertion  . End stage renal disease ([place])    Hemodialysis on Monday, Wednesday, Friday- in [place]  . Essential hypertension   . GERD (gastroesophageal reflux disease)   . Glaucoma   . History of blood transfusion   . Insomnia   . Nonischemic cardiomyopathy ([place])   . Stroke [facility]) 2016   - "they said it was not a stroke"  . Type 2 diabetes mellitus ([place])    Type II    Patient Active Problem List   Diagnosis Date Noted  . Hyperkalemia 04/05/2016  . Inadequate dialysis 04/05/2016  . Other complications due to renal dialysis device, implant, and graft 04/28/2013  . Subclavian vein occlusion (HCC) 04/28/2013  . Preoperative cardiovascular examination 12/28/2012  . Mixed hyperlipidemia 06/16/2011  . Nonischemic cardiomyopathy ([place]) 11/20/2010  . End-stage renal disease on hemodialysis ([place]) 11/20/2010  . Essential hypertension, benign 11/20/2010    Past  Surgical History:  Procedure Laterality Date  . A/V SHUNTOGRAM Right 10/06/2016   Procedure: A/V Fistulagram;  Surgeon: [name], MD;  Location: [facility];  Service: Cardiovascular;  Laterality: Right;  . AV FISTULA PLACEMENT     Hx: of  . AV FISTULA PLACEMENT Right 05/04/2013   Procedure: ARTERIOVENOUS (AV) FISTULA CREATION- RIGHT ARM;  Surgeon: Conrad [place], MD;  Location: [facility];  Service: Vascular;  Laterality: Right;  Ultrasound guided  . AV FISTULA PLACEMENT Right 07/28/2016   Procedure: BRACHIOCEPHALIC ARTERIOVENOUS (AV) FISTULA CREATION;  Surgeon: [name], MD;  Location: [place];  Service: Vascular;  Laterality: Right;  . CATARACT EXTRACTION W/PHACO Left 07/16/2014   Procedure: CATARACT EXTRACTION PHACO AND INTRAOCULAR LENS PLACEMENT (IOC);  Surgeon: [name], MD;  Location: [facility];  Service: Ophthalmology;  Laterality: Left;  CDE:8.86  . CATARACT EXTRACTION W/PHACO Right 07/30/2014   Procedure: CATARACT EXTRACTION PHACO AND INTRAOCULAR LENS PLACEMENT (IOC);  Surgeon: [name], MD;  Location: [facility];  Service: Ophthalmology;  Laterality: Right;  CDE 8.99  . COLONOSCOPY     Hx: of  . FISTULA SUPERFICIALIZATION Right 07/28/2016   Procedure: FISTULA SUPERFICIALIZATION;  Surgeon: [name], MD;  Location: [facility];  Service: Vascular;  Laterality: Right;  . FISTULA SUPERFICIALIZATION Right 10/13/2016   Procedure: BRACHIOCEPHALIC ARTERIOVENOUS FISTULA SUPERFICIALIZATION;  Surgeon: [name], MD;  Location: [place];  Service: Vascular;  Laterality: Right;  . FISTULOGRAM N/A 05/04/2013   Procedure: CENTRAL VENOGRAM;  Surgeon: Conrad [place], MD;  Location: [place];  Service: Vascular;  Laterality: N/A;  . INSERTION OF DIALYSIS CATHETER Right 05/04/2013   Procedure: INSERTION OF DIALYSIS CATHETER;  Surgeon: Conrad Jasper, MD;  Location: [facility] [place];  Service: Vascular;  Laterality: Right;  Ultrasound guided  . INSERTION OF DIALYSIS CATHETER N/A 07/28/2016    Procedure: INSERTION OF DIALYSIS CATHETER;  Surgeon: [name], MD;  Location: [place];  Service: Vascular;  Laterality: N/A;  . LIGATION OF ARTERIOVENOUS  FISTULA Left 05/04/2013   Procedure: LIGATION OF LEFT RADIAL CEPHALIC ARTERIOVENOUS  FISTULA;  Surgeon: Conrad [employer], MD;  Location: [place];  Service: Vascular;  Laterality: Left;  Ultrasound guided  . LIGATION OF ARTERIOVENOUS  FISTULA Right 07/28/2016   Procedure: LIGATION OF RADIOCEPHALIC ARTERIOVENOUS  FISTULA;  Surgeon: [name], MD;  Location: [facility];  Service: Vascular;  Laterality: Right;  . LUMBAR SPINE SURGERY    . PERIPHERAL VASCULAR CATHETERIZATION N/A 01/24/2015   Procedure: Fistulagram;  Surgeon: Conrad Lamar, MD;  Location: [facility];  Service: Cardiovascular;  Laterality: N/A;  . PERIPHERAL VASCULAR CATHETERIZATION Right 06/04/2016   Procedure: A/V Shuntogram/Fistulagram;  Surgeon: Conrad [place]on, MD;  Location: [facility];  Service: Cardiovascular;  Laterality: Right;  . REVISON OF ARTERIOVENOUS FISTULA Right 01/02/2014   Procedure: REVISON OF ARTERIOVENOUS FISTULA ANASTOMOSIS;  Surgeon: Conrad [place], MD;  Location: [facility];  Service: Vascular;  Laterality: Right;  . REVISON OF ARTERIOVENOUS FISTULA Right 01/02/2016   Procedure: REVISION OF RADIOCEPHALIC ARTERIOVENOUS FISTULA  with BOVINE PATCH ANGIOPLASTY RIGHT RADIAL ARTERY;  Surgeon: Mal [name], MD;  Location: [facility];  Service: Vascular;  Laterality: Right;  . SHUNTOGRAM N/A 11/07/2013   Procedure: Fistulogram;  Surgeon: [name], MD;  Location: [facility] C[facility] LAB;  Service: Cardiovascular;  Laterality: N/A;       Home Medications    Prior to Admission medications   Medication Sig Start Date End Date Taking? Authorizing Provider  acetaminophen (TYLENOL) 500 MG tablet Take 1,000 mg by mouth every 6 (six) hours as needed for mild pain.    [provider]  aspirin EC 325 MG tablet Take 325 mg by mouth daily.    [provider]  B Complex-C-Folic Acid (DIALYVITE TABLET) TABS Take 1 tablet by mouth every Monday, Wednesday, and Friday.     [provider]  carvedilol (COREG) 6.25 MG tablet Take 6.25 mg by mouth 2 (two) times daily with a meal. Take 6.5mg s once daily on dialysis days, Mondays, Wednesdays, and Fridays    [provider]  cinacalcet (SENSIPAR) 30 MG tablet Take 30 mg by mouth daily.    [provider]  EPIPEN 2-PAK 0.3 MG/0.3ML SOAJ injection Inject 0.3 mg into the muscle once as needed (For anaphylaxis.).  04/18/13   [provider]  HYDROcodone-acetaminophen (NORCO/VICODIN) 5-325 MG tablet Take 1 tablet by mouth 2 (two) times daily as needed for moderate pain.    [provider]  HYDROcodone-acetaminophen (NORCO/VICODIN) 5-325 MG tablet Take 1-2 tablets by mouth every 4 (four) hours as needed. 10/13/16   [name], MD  LANTUS SOLOSTAR 100 UNIT/ML Solostar Pen Inject 40 Units into the skin at bedtime. If blood sugar is lower than 150 only inject 20 units at bedtime 01/15/15   [provider]  loratadine (CLARITIN) 10 MG tablet Take 10 mg  by mouth daily as needed for allergies.    [provider]  omeprazole (PRILOSEC) 40 MG capsule Take 40 mg by mouth daily.     [provider]  oxyCODONE-acetaminophen (PERCOCET/ROXICET) 5-325 MG tablet Take 1 tablet by mouth every 6 (six) hours as needed. Patient not taking: Reported on 11/11/2016 07/28/16   [name] A, PA-C  ramipril (ALTACE) 10 MG capsule Take 10 mg by mouth daily.     [provider]  RENVELA 800 MG tablet Take 800-2,400 mg by mouth 3 (three) times daily with meals. Take 2400 mg by mouth 3 times daily with meals and 800 mg by mouth with snacks 05/25/13   [provider]    Family History Family History  Problem Relation Age of Onset  . Heart attack Brother        3  . Heart disease Brother        before age 8  . Hypertension Brother   .  Heart attack Brother        38  . Heart attack Brother        89  . Diabetes Mother   . Hypertension Mother   . Heart disease Mother   . Heart disease Father   . Hypertension Father   . Other Father        amputation  . Hypertension Sister   . Heart disease Sister   . Vision loss Maternal Uncle     Social History Social History  Substance Use Topics  . Smoking status: Never Smoker  . Smokeless tobacco: Never Used  . Alcohol use Yes     Comment: 1- fifth of gin a week     Allergies   Bee venom and Penicillins   Review of Systems Review of Systems  All other systems reviewed and are negative.    Physical Exam Updated Vital Signs BP (!) 195/86 (BP Location: Left Arm)   Pulse 80   Temp 97.8 F (36.6 C) (Oral)   Resp 20   Ht 1.88 m (6\' 2" )   Wt (!) 138.3 kg (305 lb)   SpO2 99%   BMI 39.16 kg/m   Physical Exam  Constitutional: No distress.  HENT:  Head: Normocephalic and atraumatic.  Right Ear: External ear normal.  Left Ear: External ear normal.  Eyes: Conjunctivae are normal. Right eye exhibits no discharge. Left eye exhibits no discharge. No scleral icterus.  Neck: Neck supple. No tracheal deviation present.  Cardiovascular: Normal rate, regular rhythm and intact distal pulses.   Unable to palpate bilateral DP pulses  Pulmonary/Chest: Effort normal and breath sounds normal. No stridor. No respiratory distress. He has no wheezes. He has no rales.  Abdominal: Soft. Bowel sounds are normal. He exhibits no distension. There is no tenderness. There is no rebound and no guarding.  Musculoskeletal: He exhibits no edema or tenderness.  Dark discoloration of skin on both feet, ulceration with foul-smelling drainage of the great toe, atrophy and dry gangrene of the second toe (SEE PHOTO IMAGE)  Neurological: He is alert. He has normal strength. No cranial nerve deficit (no facial droop, extraocular movements intact, no slurred speech) or sensory deficit. He exhibits  normal muscle tone. He displays no seizure activity. Coordination normal.  Sensation diminished bilateral lower extrem  Skin: Skin is warm and dry. No rash noted.  Psychiatric: He has a normal mood and affect.  Nursing note and vitals reviewed.      ED Treatments / Results  Labs (  all labs ordered are listed, but only abnormal results are displayed) Labs Reviewed  COMPREHENSIVE METABOLIC PANEL - Abnormal; Notable for the following:       Result Value   Sodium 132 (*)    Potassium 3.3 (*)    Chloride 93 (*)    Glucose, Bld 146 (*)    BUN 22 (*)    Creatinine, Ser 5.87 (*)    Calcium 8.5 (*)    Total Protein 9.0 (*)    Albumin 2.9 (*)    ALT 5 (*)    Alkaline Phosphatase 141 (*)    GFR calc non Af Amer 9 (*)    GFR calc Af Amer 10 (*)    All other components within normal limits  CBC WITH DIFFERENTIAL/PLATELET - Abnormal; Notable for the following:    RBC 3.61 (*)    Hemoglobin 10.4 (*)    HCT 33.7 (*)    RDW 15.7 (*)    Eosinophils Absolute 1.0 (*)    All other components within normal limits    EKG  EKG Interpretation None       Radiology Dg Foot Complete Right  Result Date: 12/28/2016 CLINICAL DATA:  Right foot swelling, peripheral vascular disease, necrotic 1st and 2nd toe EXAM: RIGHT FOOT COMPLETE - 3+ VIEW COMPARISON:  None. FINDINGS: No fracture or dislocation is seen. The joint spaces are preserved. Soft tissue ulceration overlying the distal 2nd and 3rd digits. No underlying cortical destruction. IMPRESSION: Soft tissue ulceration overlying the distal 2nd and 3rd digits. No underlying cortical destruction. Electronically Signed   By: Julian Hy M.D.   On: 12/28/2016 19:51    Procedures Procedures (including critical care time)  Medications Ordered in ED Medications  ciprofloxacin (CIPRO) IVPB 400 mg (400 mg Intravenous New Bag/Given 12/28/16 1842)  morphine 4 MG/ML injection 4 mg (4 mg Intravenous Given 12/28/16 1837)     Initial Impression /  Assessment and Plan / ED Course  I have reviewed the triage vital signs and the nursing notes.  Pertinent labs & imaging results that were available during my care of the patient were reviewed by me and considered in my medical decision making (see chart for details).   patient presented to the emergency room for worsening infection in his right foot. X-rays do not show evidence of bone destruction .Patient has signs of gangrene on exam.  IV antibiotics were ordered in the emergency room. Patient was already given vancomycin at dialysis. I will consult with the medical service. Patient may benefit from surgical consultation to assess the need for possible amputation.  Final Clinical Impressions(s) / ED Diagnoses   Final diagnoses:  Cellulitis of right lower extremity  Peripheral vascular disease ([place])  Gangrenous toe ([place])  Chronic renal failure, stage 5 (HCC)    New Prescriptions New Prescriptions   No medications on file     Dorie Rank, MD 12/28/16 2001

## 2016-12-28 NOTE — ED Notes (Signed)
Able to locate pedal pulse with handheld doppler. Dr Tomi Bamberger notified

## 2016-12-29 ENCOUNTER — Inpatient Hospital Stay (HOSPITAL_COMMUNITY): Payer: Medicare Other

## 2016-12-29 ENCOUNTER — Telehealth: Payer: Self-pay | Admitting: *Deleted

## 2016-12-29 DIAGNOSIS — I96 Gangrene, not elsewhere classified: Secondary | ICD-10-CM

## 2016-12-29 LAB — CBC WITH DIFFERENTIAL/PLATELET
Basophils Absolute: 0.1 10*3/uL (ref 0.0–0.1)
Basophils Relative: 1 %
Eosinophils Absolute: 1 10*3/uL — ABNORMAL HIGH (ref 0.0–0.7)
Eosinophils Relative: 12 %
HEMATOCRIT: 33.9 % — AB (ref 39.0–52.0)
Hemoglobin: 10.2 g/dL — ABNORMAL LOW (ref 13.0–17.0)
LYMPHS ABS: 1 10*3/uL (ref 0.7–4.0)
Lymphocytes Relative: 12 %
MCH: 28.3 pg (ref 26.0–34.0)
MCHC: 30.1 g/dL (ref 30.0–36.0)
MCV: 94.2 fL (ref 78.0–100.0)
MONOS PCT: 11 %
Monocytes Absolute: 0.9 10*3/uL (ref 0.1–1.0)
NEUTROS ABS: 5.2 10*3/uL (ref 1.7–7.7)
Neutrophils Relative %: 64 %
Platelets: 331 10*3/uL (ref 150–400)
RBC: 3.6 MIL/uL — ABNORMAL LOW (ref 4.22–5.81)
RDW: 15.7 % — ABNORMAL HIGH (ref 11.5–15.5)
WBC: 8.2 10*3/uL (ref 4.0–10.5)

## 2016-12-29 LAB — BASIC METABOLIC PANEL
Anion gap: 11 (ref 5–15)
BUN: 25 mg/dL — AB (ref 6–20)
CO2: 29 mmol/L (ref 22–32)
Calcium: 8.2 mg/dL — ABNORMAL LOW (ref 8.9–10.3)
Chloride: 92 mmol/L — ABNORMAL LOW (ref 101–111)
Creatinine, Ser: 6.48 mg/dL — ABNORMAL HIGH (ref 0.61–1.24)
GFR calc Af Amer: 9 mL/min — ABNORMAL LOW (ref >60)
GFR, EST NON AFRICAN AMERICAN: 8 mL/min — AB (ref >60)
GLUCOSE: 190 mg/dL — AB (ref 65–99)
Potassium: 3.2 mmol/L — ABNORMAL LOW (ref 3.5–5.1)
Sodium: 132 mmol/L — ABNORMAL LOW (ref 135–145)

## 2016-12-29 LAB — C-REACTIVE PROTEIN: CRP: 15.6 mg/dL — ABNORMAL HIGH (ref <1.0)

## 2016-12-29 LAB — GLUCOSE, CAPILLARY
GLUCOSE-CAPILLARY: 176 mg/dL — AB (ref 65–99)
GLUCOSE-CAPILLARY: 156 mg/dL — AB (ref 65–99)
Glucose-Capillary: 194 mg/dL — ABNORMAL HIGH (ref 65–99)

## 2016-12-29 LAB — PREALBUMIN: PREALBUMIN: 14.2 mg/dL — AB (ref 18–38)

## 2016-12-29 LAB — MRSA PCR SCREENING: MRSA by PCR: POSITIVE — AB

## 2016-12-29 LAB — MAGNESIUM: MAGNESIUM: 1.9 mg/dL (ref 1.7–2.4)

## 2016-12-29 MED ORDER — INSULIN GLARGINE 100 UNIT/ML ~~LOC~~ SOLN
10.0000 [IU] | Freq: Every day | SUBCUTANEOUS | Status: DC
  Administered 2016-12-29 – 2017-01-03 (×4): 10 [IU] via SUBCUTANEOUS
  Filled 2016-12-29 (×9): qty 0.1

## 2016-12-29 MED ORDER — CHLORHEXIDINE GLUCONATE CLOTH 2 % EX PADS
6.0000 | MEDICATED_PAD | Freq: Every day | CUTANEOUS | Status: AC
  Administered 2016-12-29 – 2017-01-02 (×5): 6 via TOPICAL

## 2016-12-29 MED ORDER — INSULIN ASPART 100 UNIT/ML ~~LOC~~ SOLN: 0.0000 [IU] | Freq: Every day | SUBCUTANEOUS | Status: DC

## 2016-12-29 MED ORDER — INSULIN ASPART 100 UNIT/ML ~~LOC~~ SOLN
0.0000 [IU] | Freq: Three times a day (TID) | SUBCUTANEOUS | Status: DC
  Administered 2016-12-31: 2 [IU] via SUBCUTANEOUS
  Administered 2016-12-31: 3 [IU] via SUBCUTANEOUS
  Administered 2016-12-31 – 2017-01-05 (×6): 2 [IU] via SUBCUTANEOUS

## 2016-12-29 MED ORDER — MUPIROCIN 2 % EX OINT
1.0000 "application " | TOPICAL_OINTMENT | Freq: Two times a day (BID) | CUTANEOUS | Status: AC
  Administered 2016-12-29 – 2017-01-02 (×9): 1 via NASAL
  Filled 2016-12-29 (×4): qty 22

## 2016-12-29 MED ORDER — INSULIN ASPART 100 UNIT/ML ~~LOC~~ SOLN: 0.0000 [IU] | Freq: Three times a day (TID) | SUBCUTANEOUS | Status: DC

## 2016-12-29 MED ORDER — RAMIPRIL 10 MG PO CAPS
10.0000 mg | ORAL_CAPSULE | Freq: Every day | ORAL | Status: DC
  Administered 2016-12-29 – 2017-01-05 (×7): 10 mg via ORAL
  Filled 2016-12-29 (×7): qty 1
  Filled 2016-12-29: qty 2

## 2016-12-29 MED ORDER — COLLAGENASE 250 UNIT/GM EX OINT
TOPICAL_OINTMENT | Freq: Every day | CUTANEOUS | Status: DC
  Administered 2016-12-29 – 2016-12-31 (×3): via TOPICAL
  Filled 2016-12-29: qty 30

## 2016-12-29 NOTE — Clinical Social Work Note (Signed)
Patient referred for medication assistance at discharge. CM aware. Patient has insurance.   LCSW signing off.      Lj Miyamoto, Clydene Pugh, LCSW

## 2016-12-29 NOTE — Telephone Encounter (Signed)
Spoke to [place] at [facility]; they had sent a request for removal of patient's right IJ [facility] as an outpatient. They were not aware that patient is being transferred to [facility] (Dr. Denton Brick spoke to Dr. Trula Slade today) for nonhealing ulcers on patient's RLE. I told Joy that I would put a note in EPIC to see if this access could be removed while he's an inpatient. This of course will depend on his HD needs while inpatient.

## 2016-12-29 NOTE — Consult Note (Signed)
Jacob [name] Nurse wound consult note Consult was performed via remote camera with assistance from the bedside nurse for assessment and measurements. Reason for Consult: Consult requested for right toes.  Pt states he has been using "some white ointment" in between the toes, but is unable to states the name of the product. Wound type: Right great toe with dry eschar approx 4X4cm, no fluctuance, odor, or drainage.  Other toes with dry necrotic tips.  Full thickness wound in the space between 1st and second toes; approx 1X.3cm, 100% yellow slough, small amt yellow drainage, minimal amt odor.  Dressing procedure/placement/frequency: Santyl for enzymatic ointment of nonviable tissue to inner toe wound. Discussed plan of care with patient and he denies further questions. Topical treatment will not be effective to promote healing.  Recommend vascular consult for further plan of care and ABI to assess perfusion.   Please re-consult if further assistance is needed.  Thank-you,  Julien Girt MSN, [place], [place], [place], [place]

## 2016-12-29 NOTE — Consult Note (Addendum)
[facility]ist of [place]  Patient name: Jacob [name] MRN: [id number] DOB: [birth year] Sex: male   REQUESTING PROVIDER:    Dr. Tami Ribas   REASON FOR CONSULT:    Right foot wound  HISTORY OF PRESENT ILLNESS:   Jacob [name] is a 66 y.o. male, who I have been asked to evaluate for a right foot wound that has been present for several months..  He has received vancomycin at dialysis this past week.  There is discharge from the foot with a foul odor.  He denies pain or fevers.  He is on HD M,W,F.  He suffers from anemia of chronic disease.  HE has a history of non-ischemic cardiomyopathy.  Echo from 2016 shows EF of 55%.  HE is medically managed for hypertension.  He has a history of CVA with residual right arm weakness.  The patient is a type II diabetic, which is not well controlled  PAST MEDICAL HISTORY    Past Medical History:  Diagnosis Date  . Anemia   . Anemia in chronic kidney disease(285.21)   . Anxiety   . Arthritis   . Depression   . Dyspnea    with exertion  . End stage renal disease ([place])    Hemodialysis on Monday, Wednesday, Friday- in [place]  . Essential hypertension   . GERD (gastroesophageal reflux disease)   . Glaucoma   . History of blood transfusion   . Insomnia   . Nonischemic cardiomyopathy ([place])   . Stroke [facility]) 2016   - "they said it was not a stroke"  . Type 2 diabetes mellitus (HCC)    Type II     FAMILY HISTORY   Family History  Problem Relation Age of Onset  . Heart attack Brother        38  . Heart disease Brother        before age 33  . Hypertension Brother   . Heart attack Brother        30  . Heart attack Brother        58  . Diabetes Mother   . Hypertension Mother   . Heart disease Mother   . Heart disease Father   . Hypertension Father   . Other Father        amputation  . Hypertension Sister   . Heart disease Sister   . Vision loss Maternal Uncle     SOCIAL HISTORY:     Social History   Social History  . Marital status: Single    Spouse name: N/A  . Number of children: N/A  . Years of education: N/A   Occupational History  . Not on file.   Social History Main Topics  . Smoking status: Never Smoker  . Smokeless tobacco: Never Used  . Alcohol use Yes     Comment: 1- fifth of gin a week  . Drug use: No  . Sexual activity: Yes    Birth control/ protection: None   Other Topics Concern  . Not on file   Social History Narrative  . No narrative on file    ALLERGIES:    Allergies  Allergen Reactions  . Bee Venom Anaphylaxis  . Penicillins Swelling and Other (See Comments)    SWELLING REACTION UNSPECIFIED   Has patient had a PCN reaction causing immediate rash, facial/tongue/throat swelling, SOB or lightheadedness with hypotension: No Has patient had a PCN reaction causing severe rash involving mucus membranes or skin necrosis: No  Has patient had a PCN reaction that required hospitalization No Has patient had a PCN reaction occurring within the last 10 years: No If all of the above answers are "NO", then may proceed with Cephalosporin use.    CURRENT MEDICATIONS:    Current Facility-Administered Medications  Medication Dose Route Frequency Provider Last Rate Last Dose  . acetaminophen (TYLENOL) tablet 650 mg  650 mg Oral Q6H PRN Opyd, Ilene Qua, MD       Or  . acetaminophen (TYLENOL) suppository 650 mg  650 mg Rectal Q6H PRN Opyd, Ilene Qua, MD      . aztreonam (AZACTAM) 500 mg in dextrose 5 % 50 mL IVPB  500 mg Intravenous Q12H Opyd, Ilene Qua, MD   Stopped at 12/29/16 [phone]  . bisacodyl (DULCOLAX) EC tablet 5 mg  5 mg Oral Daily PRN Opyd, Ilene Qua, MD      . carvedilol (COREG) tablet 6.25 mg  6.25 mg Oral BID WC Opyd, Ilene Qua, MD   6.25 mg at 12/29/16 1617  . Chlorhexidine Gluconate Cloth 2 % PADS 6 each  6 each Topical Q0600 Emokpae, Ejiroghene E, MD   6 each at 12/29/16 1100  . cinacalcet (SENSIPAR) tablet 30 mg  30 mg Oral Q  breakfast Opyd, Ilene Qua, MD   30 mg at 12/29/16 0848  . collagenase (SANTYL) ointment   Topical Daily Emokpae, Ejiroghene E, MD      . HYDROcodone-acetaminophen (NORCO/VICODIN) 5-325 MG per tablet 1-2 tablet  1-2 tablet Oral Q4H PRN Opyd, Ilene Qua, MD   1 tablet at 12/29/16 2216  . insulin aspart (novoLOG) injection 0-15 Units  0-15 Units Subcutaneous TID WC Emokpae, Ejiroghene E, MD      . insulin aspart (novoLOG) injection 0-5 Units  0-5 Units Subcutaneous QHS Emokpae, Ejiroghene E, MD      . insulin glargine (LANTUS) injection 10 Units  10 Units Subcutaneous Daily Emokpae, Ejiroghene E, MD   10 Units at 12/29/16 1000  . loratadine (CLARITIN) tablet 10 mg  10 mg Oral Daily PRN Opyd, Ilene Qua, MD      . metroNIDAZOLE (FLAGYL) IVPB 500 mg  500 mg Intravenous Q8H Opyd, Ilene Qua, MD 100 mL/hr at 12/29/16 2217 500 mg at 12/29/16 2217  . [START ON 12/30/2016] multivitamin (RENA-VIT) tablet 1 tablet  1 tablet Oral Q M,W,F Opyd, Timothy S, MD      . mupirocin ointment (BACTROBAN) 2 % 1 application  1 application Nasal BID Emokpae, Ejiroghene E, MD   1 application at 71/69/67 1328  . ondansetron (ZOFRAN) tablet 4 mg  4 mg Oral Q6H PRN Opyd, Ilene Qua, MD       Or  . ondansetron (ZOFRAN) injection 4 mg  4 mg Intravenous Q6H PRN Opyd, Ilene Qua, MD      . pantoprazole (PROTONIX) EC tablet 40 mg  40 mg Oral Daily Opyd, Ilene Qua, MD   40 mg at 12/29/16 0848  . polyethylene glycol (MIRALAX / GLYCOLAX) packet 17 g  17 g Oral Daily PRN Opyd, Ilene Qua, MD      . ramipril (ALTACE) capsule 10 mg  10 mg Oral Daily Emokpae, Ejiroghene E, MD   10 mg at 12/29/16 1617  . sevelamer carbonate (RENVELA) tablet 2,400 mg  2,400 mg Oral TID WC Opyd, Ilene Qua, MD   2,400 mg at 12/29/16 1328  . [START ON 12/30/2016] vancomycin (VANCOCIN) 1,500 mg in sodium chloride 0.9 % 250 mL IVPB  1,500 mg Intravenous Q M,W,F-HD Opyd,  Ilene Qua, MD        REVIEW OF SYSTEMS:   [X]  denotes positive finding, [ ]  denotes negative  finding Cardiac  Comments:  Chest pain or chest pressure:    Shortness of breath upon exertion:    Short of breath when lying flat:    Irregular heart rhythm:        Vascular    Pain in calf, thigh, or hip brought on by ambulation:    Pain in feet at night that wakes you up from your sleep:     Blood clot in your veins:    Leg swelling:         Pulmonary    Oxygen at home:    Productive cough:     Wheezing:         Neurologic    Sudden weakness in arms or legs:     Sudden numbness in arms or legs:     Sudden onset of difficulty speaking or slurred speech:    Temporary loss of vision in one eye:     Problems with dizziness:         Gastrointestinal    Blood in stool:      Vomited blood:         Genitourinary    Burning when urinating:     Blood in urine:        Psychiatric    Major depression:         Hematologic    Bleeding problems:    Problems with blood clotting too easily:        Skin    Rashes or ulcers: x       Constitutional    Fever or chills:     PHYSICAL EXAM:   Vitals:   12/29/16 0635 12/29/16 1300 12/29/16 1748 12/29/16 2210  BP: (!) 140/52 (!) 119/52 (!) 145/74 (!) 172/69  Pulse: 66 60 71 66  Resp: 18 18 18 18   Temp: 97.6 F (36.4 C) 97.7 F (36.5 C) 98 F (36.7 C) 97.7 F (36.5 C)  TempSrc: Oral Oral Oral Oral  SpO2: 100% 99% 96% 100%  Weight:      Height:        GENERAL: The patient is a well-nourished male, in no acute distress. The vital signs are documented above. CARDIAC: There is a regular rate and rhythm.  VASCULAR: palpable bilateral femoral pulses.  Patent right BCF PULMONARY: Nonlabored respirations ABDOMEN: Soft and non-tender with normal pitched bowel sounds.  MUSCULOSKELETAL: There are no major deformities or cyanosis. NEUROLOGIC: No focal weakness or paresthesias are detected. SKIN: There are no ulcers or rashes noted. PSYCHIATRIC: The patient has a normal affect.  STUDIES:   I have reviewed his foot Xray with  the following findings:  Soft tissue ulceration overlying the distal 2nd and 3rd digits.  No underlying cortical destruction.  ASSESSMENT and PLAN   Diabetic right foot ulcer with infection:  I discussed with the patient that this is a limb threatening situation.  He will at minimum require toe amputation, and possibly a more proximal amputation.   I have recommended that we proceed with angiography to define his arterial anatomy, and to intervene if there are sa=any options for revascularization.  I will try to get this done tomorrow.  He will be NPO after midnight.  I discussed the details of the procedure as well as the risks and benefits.  All questions were answered.  He would benefit from statin therapy,  given his vascular diseaase  He wants his dialysis catheter removed. If agreed upon with renal, we will do this this admission.  Annamarie Major, MD [facility] [phone] Pager [phone]

## 2016-12-29 NOTE — Progress Notes (Addendum)
PROGRESS NOTE    [name]  VXY:[id number] DOB: [birth year] DOA: 12/28/2016   PCP: Sharilyn Sites, MD   Brief Narrative:  Jacob [name] is a 66 y.o. male with medical history significant for hypertension, DM2 , chronic normocytic anemia, and ESRD, presenting with gangrene of 1st and 2nd toes- right foot wound with purulent drainage and foul odor. He received vancomycin with HD for the past week prior to admission. Despite this, there continues to be purulent discharge and foul odor from the foot and newer atrophy of the second toe as well as darkening of the forefoot.  NO fevers or chills,  And denies any pain from the foot. In the ED- pt was afebrile, hypertensive to 195/86, CBC features a normocytic anemia with hemoglobin of 10.4. Radiographs of the right foot demonstrates soft tissue ulceration over the second and third digits but no underlying cortical destruction is noted.  Assessment & Plan:   Principal Problem:   Gangrene of foot ([place]) Active Problems:   End-stage renal disease on hemodialysis (HCC)   Essential hypertension, benign   Hypokalemia   Insulin-requiring or dependent type II diabetes mellitus ([place])   Gangrene of right foot (HCC)  Diabetic foot ulcers with gangrene - 1st, 2nd toes- Rt foot.  Dry and wet gangrene at the 1-2nd digits and cellulitis extending over forefoot; left foot has eschar at some toes without acute infection. Pulses obtained by doppler only; plain radiographs negative for osteomyelitis. Afebrile and no leukocytosis but has been on vanc for the past week with HD  - ESR- 102, CRP- 15.6 - We'll order MRI right foot to rule out osteomyelitis.  - Pt will need vascular workup, to determine extent of amputation, pending vascular surgery evaluation. - Consulted vascular surgery at [facility]- Dr. Trula Slade, agree with transfer to [facility] under hospitalist service, They will see in consult -  Cont empiric aztreonam, Flagyl, and vanc 5/28 >> in setting of diabetic on  HD with severe penicillin allergy  - Anticipate he will likely lose the forefoot at least; ASA was held on admission pending surgery  - Hold DVT prophylaxis, pending surgery - nothing by mouth from midnight - Vascular surgery to be consulted on arrival at [facility]  ESRD - Pt presents from dialysis center where he reports completing the full session. Dialyzes via catheter in right chest  - SLIV, renal diet, continue binders  - His consult nephrology for HD, next HD due 12/30/16.  Insulin-dependent DM- home meds Lantus 20 qHS  - Continue home Lantus at a reduced dose 10 daily  - SSI - M  Hypertension   - BP is elevated in ED, normalized with analgesia  - Continue Coreg,  and ramipril    DVT prophylaxis:  SCDs  Code Status:  Full  Family Communication: Sister at bedside. Disposition Plan: to be determined.  Consultants:   Vascular surgery.   Procedures:   None  Antimicrobials: (specify start and planned stop date. Auto populated tables are space occupying and do not give end dates)  Vanc 5/28 >>  Aztreonam 5/28 >>  Flagyl 5/28 >>   Subjective: No complaints today. Foul smell from right foot. Sister at bedside.  Objective: Vitals:   12/28/16 1756 12/28/16 2100 12/28/16 2313 12/29/16 0635  BP: (!) 195/86 140/76 (!) 193/76 (!) 140/52  Pulse: 80 72 74 66  Resp: '20 16 18 18  ' Temp:   97.7 F (36.5 C) 97.6 F (36.4 C)  TempSrc:   Oral Oral  SpO2: 99% 98% 99% 100%  Weight:      Height:        Intake/Output Summary (Last 24 hours) at 12/29/16 1439 Last data filed at 12/29/16 5397  Gross per 24 hour  Intake              540 ml  Output                0 ml  Net              540 ml   Filed Weights   12/28/16 1410  Weight: (!) 138.3 kg (305 lb)    Examination:  Constitutional: No acute distress, obese  Eyes: upper palpebral nodule noted on the right. ENMT: Mucous membranes are moist.   Neck: normal, supple, no masses, no thyromegaly Respiratory:  equal  breath sounds, no added sounds Cardiovascular: S1 & S2 heard, regular rate and rhythm. Abdomen: No distension, no tenderness, no masses palpated. Bowel sounds normal.  Musculoskeletal: no clubbing / cyanosis. No joint deformity upper and lower extremities.  Skin: Ulcerations involving 1st-3rd right toes with gangrenous changes to 1st and 2nd digits, third toe also appears involved. Right foot back appearing .  Neurologic: CN 2-12 grossly intact. Sensation diminished in bilateral feet. Strength 5/5 in all 4 limbs.  Psychiatric: Alert and oriented x 3. Calm and cooperative.   Data Reviewed:  CBC:  Recent Labs Lab 12/28/16 1836 12/29/16 0624  WBC 8.1 8.2  NEUTROABS 5.6 5.2  HGB 10.4* 10.2*  HCT 33.7* 33.9*  MCV 93.4 94.2  PLT 335 673   Basic Metabolic Panel:  Recent Labs Lab 12/28/16 1836 12/29/16 0624  NA 132* 132*  K 3.3* 3.2*  CL 93* 92*  CO2 28 29  GLUCOSE 146* 190*  BUN 22* 25*  CREATININE 5.87* 6.48*  CALCIUM 8.5* 8.2*  MG  --  1.9   Liver Function Tests:  Recent Labs Lab 12/28/16 1836  AST 18  ALT 5*  ALKPHOS 141*  BILITOT 0.4  PROT 9.0*  ALBUMIN 2.9*   CBG:  Recent Labs Lab 12/28/16 2313 12/29/16 0804 12/29/16 1157  GLUCAP 170* 176* 194*   Recent Results (from the past 240 hour(s))  MRSA PCR Screening     Status: Abnormal   Collection Time: 12/29/16 12:54 AM  Result Value Ref Range Status   MRSA by PCR POSITIVE (A) NEGATIVE Final    Comment:        The GeneXpert MRSA Assay (FDA approved for NASAL specimens only), is one component of a comprehensive MRSA colonization surveillance program. It is not intended to diagnose MRSA infection nor to guide or monitor treatment for MRSA infections. RESULT CALLED TO, READ BACK BY AND VERIFIED WITH: BULLINS,M @ 833 BY MATTHEWS,B      Radiology Studies: Dg Foot Complete Right  Result Date: 12/28/2016 CLINICAL DATA:  Right foot swelling, peripheral vascular disease, necrotic 1st and 2nd toe  EXAM: RIGHT FOOT COMPLETE - 3+ VIEW COMPARISON:  None. FINDINGS: No fracture or dislocation is seen. The joint spaces are preserved. Soft tissue ulceration overlying the distal 2nd and 3rd digits. No underlying cortical destruction. IMPRESSION: Soft tissue ulceration overlying the distal 2nd and 3rd digits. No underlying cortical destruction. Electronically Signed   By: Julian Hy M.D.   On: 12/28/2016 19:51    Scheduled Meds: . carvedilol  6.25 mg Oral BID WC  . Chlorhexidine Gluconate Cloth  6 each Topical Q0600  . cinacalcet  30 mg Oral Q breakfast  .  collagenase   Topical Daily  . heparin  5,000 Units Subcutaneous Q8H  . insulin aspart  0-5 Units Subcutaneous QHS  . insulin aspart  0-9 Units Subcutaneous TID WC  . insulin glargine  10 Units Subcutaneous Daily  . [START ON 12/30/2016] multivitamin  1 tablet Oral Q M,W,F  . mupirocin ointment  1 application Nasal BID  . pantoprazole  40 mg Oral Daily  . sevelamer carbonate  2,400 mg Oral TID WC  . sodium chloride flush  3 mL Intravenous Q12H   Continuous Infusions: . sodium chloride    . aztreonam Stopped (12/29/16 3235)  . metronidazole 500 mg (12/29/16 1329)  . [START ON 12/30/2016] vancomycin       LOS: 1 day    Bethena Roys, MD [facility] Pager [phone].  If 7PM-7AM, please contact night-coverage [email or web address] Password [facility] 12/29/2016, 2:39 PM

## 2016-12-29 NOTE — Progress Notes (Signed)
Santyl applied to patients R toes on open wound areas.  Covered with moist gauze and wrapped.

## 2016-12-29 NOTE — Progress Notes (Addendum)
Inpatient Diabetes Program Recommendations  AACE/ADA: New Consensus Statement on Inpatient Glycemic Control (2015)  Target Ranges:  Prepandial:   less than 140 mg/dL      Peak postprandial:   less than 180 mg/dL (1-2 hours)      Critically ill patients:  140 - 180 mg/dL   Results for Jacob [name], Jacob [name] (MRN [id number]) as of 12/29/2016 07:42  Ref. Range 12/28/2016 18:36 12/29/2016 06:24  Glucose Latest Ref Range: 65 - 99 mg/dL 146 (H) 190 (H)   Results for Jacob [name], Jacob [name] (MRN [id number]) as of 12/29/2016 07:42  Ref. Range 12/28/2016 23:13  Glucose-Capillary Latest Ref Range: 65 - 99 mg/dL 170 (H)   Review of Glycemic Control  Diabetes history: DM2 Outpatient Diabetes medications: Lantus 20 units QHS (if CBG >150 mg/dl) Current orders for Inpatient glycemic control: Lantus 20 units QHS, Novolog 0-9 units TID with meals, Novolog 0-5 units QHS  Inpatient Diabetes Program Recommendations: Insulin - Basal: Patient is ordered Lantus 20 units QHS. Per chart review, noted Lantus was NOT GIVEN last night and charted as refused. Fasting glucose 190 mg/dl this morning on labs. Please consider decreasing Lantus to 10 units and change frequency to daily so that patient receives this morning. HgbA1C: A1C in process.  Addendum 12/29/16@10 :45-Noted consult per Diabetic Foot Ulcer order set. Spoke with patient and his wife about diabetes and home regimen for diabetes control. Patient reports that he is followed by PCP for diabetes management and currently he takes Lantus 20 units QHS as an outpatient for diabetes control.  In further talking with patient, patient reports that he does not consistently take Lantus. Per patient, he does not take Lantus if CBG <150-160 mg/dl. Patient reports that he last took Lantus on Sunday (12/27/16).  Patient reports that he has been on Lantus for at least 5 years.  Patient states that he checks his glucose 2 times per day and that it is usually in the 100's mg/dl with max glucose of 170  mg/dl over the past 1 week.  Discussed glucose and A1C goals. Patient reports the he has not made any recent changes with diet or exercise.  Noted patient refused Lantus last night and patient confirms that he refused the Lantus because he felt his glucose would get too low. Discussed current insulin orders and explained that a recommendation was being made to decrease Lantus while inpatient.  Encouraged patient to follow up with PCP and to be sure to let his PCP know how often he was skipping the Lantus in case adjustments were needed. Patient verbalized understanding of information discussed and he states that he has no further questions at this time related to diabetes. Will continue to follow.  Thanks, Barnie Alderman, RN, MSN, CDE Diabetes Coordinator Inpatient Diabetes Program [phone] (Team Pager from 8am to 5pm)

## 2016-12-29 NOTE — Progress Notes (Signed)
Patient transferring to [place].  Patient and family aware and agreeable.  Report given to Katrina, RN on receiving unit.  Awaiting Care link to transport.  Belongings packed by patients sister to be sent with patient.

## 2016-12-30 ENCOUNTER — Encounter (HOSPITAL_COMMUNITY)
Admission: EM | Disposition: A | Discharge status: Skilled Nursing Facility | Payer: Self-pay | Source: Home / Self Care | Attending: Internal Medicine

## 2016-12-30 DIAGNOSIS — I10 Essential (primary) hypertension: Secondary | ICD-10-CM

## 2016-12-30 DIAGNOSIS — L03115 Cellulitis of right lower limb: Secondary | ICD-10-CM

## 2016-12-30 DIAGNOSIS — I998 Other disorder of circulatory system: Secondary | ICD-10-CM

## 2016-12-30 DIAGNOSIS — E119 Type 2 diabetes mellitus without complications: Secondary | ICD-10-CM

## 2016-12-30 DIAGNOSIS — D638 Anemia in other chronic diseases classified elsewhere: Secondary | ICD-10-CM

## 2016-12-30 DIAGNOSIS — N185 Chronic kidney disease, stage 5: Secondary | ICD-10-CM

## 2016-12-30 DIAGNOSIS — I739 Peripheral vascular disease, unspecified: Secondary | ICD-10-CM

## 2016-12-30 DIAGNOSIS — Z794 Long term (current) use of insulin: Secondary | ICD-10-CM

## 2016-12-30 DIAGNOSIS — Z992 Dependence on renal dialysis: Secondary | ICD-10-CM

## 2016-12-30 DIAGNOSIS — N186 End stage renal disease: Secondary | ICD-10-CM

## 2016-12-30 HISTORY — PX: ABDOMINAL AORTOGRAM: CATH118222

## 2016-12-30 HISTORY — PX: PERIPHERAL VASCULAR BALLOON ANGIOPLASTY: CATH118281

## 2016-12-30 HISTORY — PX: PERIPHERAL VASCULAR ATHERECTOMY: CATH118256

## 2016-12-30 HISTORY — PX: LOWER EXTREMITY ANGIOGRAPHY: CATH118251

## 2016-12-30 LAB — GLUCOSE, CAPILLARY
GLUCOSE-CAPILLARY: 132 mg/dL — AB (ref 65–99)
GLUCOSE-CAPILLARY: 145 mg/dL — AB (ref 65–99)
Glucose-Capillary: 130 mg/dL — ABNORMAL HIGH (ref 65–99)
Glucose-Capillary: 153 mg/dL — ABNORMAL HIGH (ref 65–99)

## 2016-12-30 LAB — BASIC METABOLIC PANEL
ANION GAP: 11 (ref 5–15)
BUN: 30 mg/dL — ABNORMAL HIGH (ref 6–20)
CALCIUM: 8.2 mg/dL — AB (ref 8.9–10.3)
CO2: 27 mmol/L (ref 22–32)
CREATININE: 7.84 mg/dL — AB (ref 0.61–1.24)
Chloride: 92 mmol/L — ABNORMAL LOW (ref 101–111)
GFR, EST AFRICAN AMERICAN: 7 mL/min — AB (ref >60)
GFR, EST NON AFRICAN AMERICAN: 6 mL/min — AB (ref >60)
Glucose, Bld: 169 mg/dL — ABNORMAL HIGH (ref 65–99)
Potassium: 3.6 mmol/L (ref 3.5–5.1)
SODIUM: 130 mmol/L — AB (ref 135–145)

## 2016-12-30 LAB — HIV ANTIBODY (ROUTINE TESTING W REFLEX): HIV SCREEN 4TH GENERATION: NONREACTIVE

## 2016-12-30 LAB — CBC
HCT: 32.9 % — ABNORMAL LOW (ref 39.0–52.0)
HCT: 31.1 % — ABNORMAL LOW (ref 39.0–52.0)
Hemoglobin: 10 g/dL — ABNORMAL LOW (ref 13.0–17.0)
Hemoglobin: 9.6 g/dL — ABNORMAL LOW (ref 13.0–17.0)
MCH: 28.2 pg (ref 26.0–34.0)
MCH: 28.2 pg (ref 26.0–34.0)
MCHC: 30.4 g/dL (ref 30.0–36.0)
MCHC: 30.9 g/dL (ref 30.0–36.0)
MCV: 92.9 fL (ref 78.0–100.0)
MCV: 91.2 fL (ref 78.0–100.0)
PLATELETS: 325 10*3/uL (ref 150–400)
PLATELETS: 370 10*3/uL (ref 150–400)
RBC: 3.54 MIL/uL — ABNORMAL LOW (ref 4.22–5.81)
RBC: 3.41 MIL/uL — ABNORMAL LOW (ref 4.22–5.81)
RDW: 15.8 % — ABNORMAL HIGH (ref 11.5–15.5)
RDW: 15.6 % — AB (ref 11.5–15.5)
WBC: 9 10*3/uL (ref 4.0–10.5)
WBC: 7.9 10*3/uL (ref 4.0–10.5)

## 2016-12-30 LAB — RENAL FUNCTION PANEL
ALBUMIN: 2.4 g/dL — AB (ref 3.5–5.0)
Anion gap: 12 (ref 5–15)
BUN: 34 mg/dL — AB (ref 6–20)
CO2: 24 mmol/L (ref 22–32)
CREATININE: 8.97 mg/dL — AB (ref 0.61–1.24)
Calcium: 8 mg/dL — ABNORMAL LOW (ref 8.9–10.3)
Chloride: 94 mmol/L — ABNORMAL LOW (ref 101–111)
GFR calc Af Amer: 6 mL/min — ABNORMAL LOW (ref >60)
GFR, EST NON AFRICAN AMERICAN: 5 mL/min — AB (ref >60)
Glucose, Bld: 138 mg/dL — ABNORMAL HIGH (ref 65–99)
PHOSPHORUS: 3.8 mg/dL (ref 2.5–4.6)
Potassium: 3.6 mmol/L (ref 3.5–5.1)
Sodium: 130 mmol/L — ABNORMAL LOW (ref 135–145)

## 2016-12-30 LAB — PROTIME-INR
INR: 1.3
PROTHROMBIN TIME: 16.3 s — AB (ref 11.4–15.2)

## 2016-12-30 LAB — HEMOGLOBIN A1C
Hgb A1c MFr Bld: 7.7 % — ABNORMAL HIGH (ref 4.8–5.6)
Mean Plasma Glucose: 174 mg/dL

## 2016-12-30 LAB — POCT ACTIVATED CLOTTING TIME
ACTIVATED CLOTTING TIME: 219 s
ACTIVATED CLOTTING TIME: 219 s
ACTIVATED CLOTTING TIME: 186 s
ACTIVATED CLOTTING TIME: 169 s
Activated Clotting Time: 197 seconds

## 2016-12-30 SURGERY — LOWER EXTREMITY ANGIOGRAPHY
Anesthesia: LOCAL | Laterality: Right

## 2016-12-30 MED ORDER — CLOPIDOGREL BISULFATE 75 MG PO TABS
75.0000 mg | ORAL_TABLET | Freq: Every day | ORAL | Status: DC
  Administered 2016-12-31 – 2017-01-05 (×4): 75 mg via ORAL
  Filled 2016-12-30 (×5): qty 1

## 2016-12-30 MED ORDER — MORPHINE SULFATE (PF) 4 MG/ML IV SOLN
2.0000 mg | INTRAVENOUS | Status: DC | PRN
  Administered 2017-01-01 – 2017-01-03 (×7): 2 mg via INTRAVENOUS
  Filled 2016-12-30 (×7): qty 1

## 2016-12-30 MED ORDER — HYDRALAZINE HCL 20 MG/ML IJ SOLN
20.0000 mg | Freq: Once | INTRAMUSCULAR | Status: AC
  Administered 2016-12-30: 20 mg via INTRAVENOUS

## 2016-12-30 MED ORDER — SODIUM CHLORIDE 0.9 % IV SOLN
INTRAVENOUS | Status: DC | PRN
  Administered 2016-12-30: 15:00:00 via SURGICAL_CAVITY

## 2016-12-30 MED ORDER — OXYCODONE-ACETAMINOPHEN 5-325 MG PO TABS
1.0000 | ORAL_TABLET | ORAL | Status: DC | PRN
  Administered 2016-12-30 (×2): 2 via ORAL

## 2016-12-30 MED ORDER — MIDAZOLAM HCL 2 MG/2ML IJ SOLN
INTRAMUSCULAR | Status: AC
  Filled 2016-12-30: qty 2

## 2016-12-30 MED ORDER — HEPARIN SODIUM (PORCINE) 1000 UNIT/ML IJ SOLN
INTRAMUSCULAR | Status: AC
  Filled 2016-12-30: qty 1

## 2016-12-30 MED ORDER — ONDANSETRON HCL 4 MG/2ML IJ SOLN: 4.0000 mg | Freq: Four times a day (QID) | INTRAMUSCULAR | Status: DC | PRN

## 2016-12-30 MED ORDER — MIDAZOLAM HCL 2 MG/2ML IJ SOLN
INTRAMUSCULAR | Status: DC | PRN
  Administered 2016-12-30: 0.5 mg via INTRAVENOUS

## 2016-12-30 MED ORDER — NITROGLYCERIN IN D5W 200-5 MCG/ML-% IV SOLN
INTRAVENOUS | Status: AC
  Filled 2016-12-30: qty 250

## 2016-12-30 MED ORDER — CLOPIDOGREL BISULFATE 300 MG PO TABS
ORAL_TABLET | ORAL | Status: AC
  Filled 2016-12-30: qty 1

## 2016-12-30 MED ORDER — IODIXANOL 320 MG/ML IV SOLN
INTRAVENOUS | Status: DC | PRN
  Administered 2016-12-30: 175 mL via INTRA_ARTERIAL

## 2016-12-30 MED ORDER — HEPARIN (PORCINE) IN NACL 2-0.9 UNIT/ML-% IJ SOLN
INTRAMUSCULAR | Status: AC | PRN
  Administered 2016-12-30: 1000 mL

## 2016-12-30 MED ORDER — LIDOCAINE HCL (PF) 1 % IJ SOLN
INTRAMUSCULAR | Status: DC | PRN
  Administered 2016-12-30: 20 mL

## 2016-12-30 MED ORDER — FENTANYL CITRATE (PF) 100 MCG/2ML IJ SOLN
INTRAMUSCULAR | Status: AC
  Filled 2016-12-30: qty 2

## 2016-12-30 MED ORDER — HEPARIN SODIUM (PORCINE) 1000 UNIT/ML IJ SOLN
INTRAMUSCULAR | Status: DC | PRN
  Administered 2016-12-30: 10000 [IU] via INTRAVENOUS
  Administered 2016-12-30: 2000 [IU] via INTRAVENOUS
  Administered 2016-12-30: 4000 [IU] via INTRAVENOUS
  Administered 2016-12-30: 2000 [IU] via INTRAVENOUS

## 2016-12-30 MED ORDER — SODIUM CHLORIDE 0.9 % IV SOLN: 100.0000 mL | INTRAVENOUS | Status: DC | PRN

## 2016-12-30 MED ORDER — LIDOCAINE HCL (PF) 1 % IJ SOLN: 5.0000 mL | INTRAMUSCULAR | Status: DC | PRN

## 2016-12-30 MED ORDER — OXYCODONE-ACETAMINOPHEN 5-325 MG PO TABS
ORAL_TABLET | ORAL | Status: AC
  Administered 2016-12-30: 2 via ORAL
  Filled 2016-12-30: qty 2

## 2016-12-30 MED ORDER — ALTEPLASE 2 MG IJ SOLR: 2.0000 mg | Freq: Once | INTRAMUSCULAR | Status: DC | PRN

## 2016-12-30 MED ORDER — CLOPIDOGREL BISULFATE 300 MG PO TABS
300.0000 mg | ORAL_TABLET | Freq: Once | ORAL | Status: AC
  Administered 2016-12-30: 300 mg via ORAL

## 2016-12-30 MED ORDER — HEPARIN (PORCINE) IN NACL 2-0.9 UNIT/ML-% IJ SOLN
INTRAMUSCULAR | Status: AC
  Filled 2016-12-30: qty 1000

## 2016-12-30 MED ORDER — LIDOCAINE-PRILOCAINE 2.5-2.5 % EX CREA: 1.0000 "application " | TOPICAL_CREAM | CUTANEOUS | Status: DC | PRN

## 2016-12-30 MED ORDER — PENTAFLUOROPROP-TETRAFLUOROETH EX AERO: 1.0000 "application " | INHALATION_SPRAY | CUTANEOUS | Status: DC | PRN

## 2016-12-30 MED ORDER — HEPARIN SODIUM (PORCINE) 1000 UNIT/ML DIALYSIS: 1000.0000 [IU] | INTRAMUSCULAR | Status: DC | PRN

## 2016-12-30 MED ORDER — NITROGLYCERIN 1 MG/10 ML FOR IR/CATH LAB
INTRA_ARTERIAL | Status: DC | PRN
  Administered 2016-12-30: 200 ug via INTRA_ARTERIAL
  Administered 2016-12-30: 100 ug via INTRA_ARTERIAL

## 2016-12-30 MED ORDER — FENTANYL CITRATE (PF) 100 MCG/2ML IJ SOLN
INTRAMUSCULAR | Status: DC | PRN
  Administered 2016-12-30 (×2): 25 ug via INTRAVENOUS

## 2016-12-30 MED ORDER — LIDOCAINE HCL 1 % IJ SOLN
INTRAMUSCULAR | Status: AC
  Filled 2016-12-30: qty 20

## 2016-12-30 MED ORDER — HYDRALAZINE HCL 20 MG/ML IJ SOLN
INTRAMUSCULAR | Status: AC
  Filled 2016-12-30: qty 1

## 2016-12-30 MED ORDER — VERAPAMIL HCL 2.5 MG/ML IV SOLN
INTRAVENOUS | Status: AC
  Filled 2016-12-30: qty 2

## 2016-12-30 MED ORDER — ACETAMINOPHEN 325 MG PO TABS: 650.0000 mg | ORAL_TABLET | ORAL | Status: DC | PRN

## 2016-12-30 SURGICAL SUPPLY — 30 items
BALLN ADVANCE 14LP 3X20X170 (BALLOONS) ×3
BALLN COYOTE OTW 2X100X150 (BALLOONS) ×3
BALLN IN.PACT DCB 5X40 (BALLOONS) ×3
BALLOON ADVANCE 14LP 3X20X170 (BALLOONS) IMPLANT
BALLOON COYOTE OTW 2X100X150 (BALLOONS) IMPLANT
CATH CXI SUPP ANG 2.6FR 150CM (MICROCATHETER) ×1 IMPLANT
CATH OMNI FLUSH 5F 65CM (CATHETERS) ×1 IMPLANT
CATH SOFT-VU 4F 65 STRAIGHT (CATHETERS) IMPLANT
CATH SOFT-VU STRAIGHT 4F 65CM (CATHETERS) ×3
COVER DOME SNAP 22 D (MISCELLANEOUS) ×1 IMPLANT
COVER PRB 48X5XTLSCP FOLD TPE (BAG) IMPLANT
COVER PROBE 5X48 (BAG) ×3
CROWN STEALTH MICRO-30 1.25MM (CATHETERS) ×1 IMPLANT
DCB IN.PACT 5X40 (BALLOONS) IMPLANT
DEVICE CONTINUOUS FLUSH (MISCELLANEOUS) ×1 IMPLANT
GUIDEWIRE ANGLED .035X150CM (WIRE) ×1 IMPLANT
GUIDEWIRE STR TIP .014X300X8 (WIRE) ×1 IMPLANT
HOVERMATT SINGLE USE (MISCELLANEOUS) ×1 IMPLANT
KIT ENCORE 26 ADVANTAGE (KITS) ×1 IMPLANT
KIT MICROINTRODUCER STIFF 5F (SHEATH) ×1 IMPLANT
KIT PV (KITS) ×3 IMPLANT
LUBRICANT VIPERSLIDE CORONARY (MISCELLANEOUS) ×1 IMPLANT
SHEATH HIGHFLEX ANSEL 6FRX55 (SHEATH) ×1 IMPLANT
SHEATH PINNACLE 5F 10CM (SHEATH) ×1 IMPLANT
SYR MEDRAD MARK V 150ML (SYRINGE) ×3 IMPLANT
TRANSDUCER W/STOPCOCK (MISCELLANEOUS) ×3 IMPLANT
TRAY PV CATH (CUSTOM PROCEDURE TRAY) ×3 IMPLANT
WIRE BENTSON .035X145CM (WIRE) ×1 IMPLANT
WIRE G V18X300CM (WIRE) ×1 IMPLANT
WIRE VIPER WIRECTO 0.014 (WIRE) ×1 IMPLANT

## 2016-12-30 NOTE — Consult Note (Addendum)
Reason for Consult: Continuity of ESRD care Referring Physician: Cristal Ford D.O. [facility])  HPI:  66 year old African-American man with history of insulin-dependent diabetes mellitus, hypertension, peripheral vascular disease and end-stage renal disease on hemodialysis (Monday/Wednesday/Friday in [place]). He also has a history of nonischemic cardiomyopathy and CVA in the past with residual right-sided arm weakness. Presented to the hospital with a nonhealing wound of his right lower extremity that has recently developed a discharge with foul odor. He is going to undergo an arteriogram today to decide on further options of management that likely will require amputation.  Reports indicate that he was on intravenous vancomycin as an outpatient over the past week and the patient denies any fever or chills. He denies any nausea, vomiting or diarrhea. He denies any chest pain or increasing shortness of breath.  Past Medical History:  Diagnosis Date  . Anemia   . Anemia in chronic kidney disease(285.21)   . Anxiety   . Arthritis   . Depression   . Dyspnea    with exertion  . End stage renal disease ([place])    Hemodialysis on Monday, Wednesday, Friday- in [place]  . Essential hypertension   . GERD (gastroesophageal reflux disease)   . Glaucoma   . History of blood transfusion   . Insomnia   . Nonischemic cardiomyopathy ([place])   . Stroke [facility]) 2016   - "they said it was not a stroke"  . Type 2 diabetes mellitus ([place])    Type II    Past Surgical History:  Procedure Laterality Date  . A/V SHUNTOGRAM Right 10/06/2016   Procedure: A/V Fistulagram;  Surgeon: [name], MD;  Location: [place] [facility];  Service: Cardiovascular;  Laterality: Right;  . AV FISTULA PLACEMENT     Hx: of  . AV FISTULA PLACEMENT Right 05/04/2013   Procedure: ARTERIOVENOUS (AV) FISTULA CREATION- RIGHT ARM;  Surgeon: Conrad Whiteville, MD;  Location: [place];  Service: Vascular;  Laterality: Right;  Ultrasound guided  . AV  FISTULA PLACEMENT Right 07/28/2016   Procedure: BRACHIOCEPHALIC ARTERIOVENOUS (AV) FISTULA CREATION;  Surgeon: [name], MD;  Location: [place];  Service: Vascular;  Laterality: Right;  . CATARACT EXTRACTION W/PHACO Left 07/16/2014   Procedure: CATARACT EXTRACTION PHACO AND INTRAOCULAR LENS PLACEMENT (IOC);  Surgeon: [name], MD;  Location: [facility];  Service: Ophthalmology;  Laterality: Left;  CDE:8.86  . CATARACT EXTRACTION W/PHACO Right 07/30/2014   Procedure: CATARACT EXTRACTION PHACO AND INTRAOCULAR LENS PLACEMENT (IOC);  Surgeon: [name], MD;  Location: [facility];  Service: Ophthalmology;  Laterality: Right;  CDE 8.99  . COLONOSCOPY     Hx: of  . FISTULA SUPERFICIALIZATION Right 07/28/2016   Procedure: FISTULA SUPERFICIALIZATION;  Surgeon: [name], MD;  Location: [place];  Service: Vascular;  Laterality: Right;  . FISTULA SUPERFICIALIZATION Right 10/13/2016   Procedure: BRACHIOCEPHALIC ARTERIOVENOUS FISTULA SUPERFICIALIZATION;  Surgeon: [name], MD;  Location: [place];  Service: Vascular;  Laterality: Right;  . FISTULOGRAM N/A 05/04/2013   Procedure: CENTRAL VENOGRAM;  Surgeon: Conrad Electra, MD;  Location: [place];  Service: Vascular;  Laterality: N/A;  . INSERTION OF DIALYSIS CATHETER Right 05/04/2013   Procedure: INSERTION OF DIALYSIS CATHETER;  Surgeon: Conrad Byram, MD;  Location: [place];  Service: Vascular;  Laterality: Right;  Ultrasound guided  . INSERTION OF DIALYSIS CATHETER N/A 07/28/2016   Procedure: INSERTION OF DIALYSIS CATHETER;  Surgeon: [name], MD;  Location: [place];  Service: Vascular;  Laterality: N/A;  .  LIGATION OF ARTERIOVENOUS  FISTULA Left 05/04/2013   Procedure: LIGATION OF LEFT RADIAL CEPHALIC ARTERIOVENOUS  FISTULA;  Surgeon: Conrad Bull Run, MD;  Location: [place];  Service: Vascular;  Laterality: Left;  Ultrasound guided  . LIGATION OF ARTERIOVENOUS  FISTULA Right 07/28/2016   Procedure: LIGATION OF RADIOCEPHALIC  ARTERIOVENOUS  FISTULA;  Surgeon: [name], MD;  Location: [place];  Service: Vascular;  Laterality: Right;  . LUMBAR SPINE SURGERY    . PERIPHERAL VASCULAR CATHETERIZATION N/A 01/24/2015   Procedure: Fistulagram;  Surgeon: Conrad Conroy, MD;  Location: [place] [facility];  Service: Cardiovascular;  Laterality: N/A;  . PERIPHERAL VASCULAR CATHETERIZATION Right 06/04/2016   Procedure: A/V Shuntogram/Fistulagram;  Surgeon: Conrad Cadiz, MD;  Location: [place] [facility];  Service: Cardiovascular;  Laterality: Right;  . REVISON OF ARTERIOVENOUS FISTULA Right 01/02/2014   Procedure: REVISON OF ARTERIOVENOUS FISTULA ANASTOMOSIS;  Surgeon: Conrad Savannah, MD;  Location: [place];  Service: Vascular;  Laterality: Right;  . REVISON OF ARTERIOVENOUS FISTULA Right 01/02/2016   Procedure: REVISION OF RADIOCEPHALIC ARTERIOVENOUS FISTULA  with BOVINE PATCH ANGIOPLASTY RIGHT RADIAL ARTERY;  Surgeon: Mal [name], MD;  Location: [place];  Service: Vascular;  Laterality: Right;  . SHUNTOGRAM N/A 11/07/2013   Procedure: Fistulogram;  Surgeon: [name], MD;  Location: [facility];  Service: Cardiovascular;  Laterality: N/A;    Family History  Problem Relation Age of Onset  . Heart attack Brother        73  . Heart disease Brother        before age 85  . Hypertension Brother   . Heart attack Brother        76  . Heart attack Brother        52  . Diabetes Mother   . Hypertension Mother   . Heart disease Mother   . Heart disease Father   . Hypertension Father   . Other Father        amputation  . Hypertension Sister   . Heart disease Sister   . Vision loss Maternal Uncle     Social History:  reports that he has never smoked. He has never used smokeless tobacco. He reports that he drinks alcohol. He reports that he does not use drugs.  Allergies:  Allergies  Allergen Reactions  . Bee Venom Anaphylaxis  . Penicillins Swelling and Other (See Comments)    SWELLING REACTION UNSPECIFIED   Has  patient had a PCN reaction causing immediate rash, facial/tongue/throat swelling, SOB or lightheadedness with hypotension: No Has patient had a PCN reaction causing severe rash involving mucus membranes or skin necrosis: No Has patient had a PCN reaction that required hospitalization No Has patient had a PCN reaction occurring within the last 10 years: No If all of the above answers are "NO", then may proceed with Cephalosporin use.    Medications:  Scheduled: . carvedilol  6.25 mg Oral BID WC  . Chlorhexidine Gluconate Cloth  6 each Topical Q0600  . cinacalcet  30 mg Oral Q breakfast  . collagenase   Topical Daily  . insulin aspart  0-15 Units Subcutaneous TID WC  . insulin aspart  0-5 Units Subcutaneous QHS  . insulin glargine  10 Units Subcutaneous Daily  . multivitamin  1 tablet Oral Q M,W,F  . mupirocin ointment  1 application Nasal BID  . pantoprazole  40 mg Oral Daily  . ramipril  10 mg Oral Daily  . sevelamer carbonate  2,400 mg Oral TID WC    Results for orders placed or performed during the hospital encounter of 12/28/16 (from the past 48 hour(s))  Comprehensive metabolic panel     Status: Abnormal   Collection Time: 12/28/16  6:36 PM  Result Value Ref Range   Sodium 132 (L) 135 - 145 mmol/L   Potassium 3.3 (L) 3.5 - 5.1 mmol/L   Chloride 93 (L) 101 - 111 mmol/L   CO2 28 22 - 32 mmol/L   Glucose, Bld 146 (H) 65 - 99 mg/dL   BUN 22 (H) 6 - 20 mg/dL   Creatinine, Ser 5.87 (H) 0.61 - 1.24 mg/dL   Calcium 8.5 (L) 8.9 - 10.3 mg/dL   Total Protein 9.0 (H) 6.5 - 8.1 g/dL   Albumin 2.9 (L) 3.5 - 5.0 g/dL   AST 18 15 - 41 U/L   ALT 5 (L) 17 - 63 U/L   Alkaline Phosphatase 141 (H) 38 - 126 U/L   Total Bilirubin 0.4 0.3 - 1.2 mg/dL   GFR calc non Af Amer 9 (L) >60 mL/min   GFR calc Af Amer 10 (L) >60 mL/min    Comment: (NOTE) The eGFR has been calculated using the CKD EPI equation. This calculation has not been validated in all clinical situations. eGFR's persistently <60  mL/min signify possible Chronic Kidney Disease.    Anion gap 11 5 - 15  CBC with Differential     Status: Abnormal   Collection Time: 12/28/16  6:36 PM  Result Value Ref Range   WBC 8.1 4.0 - 10.5 K/uL   RBC 3.61 (L) 4.22 - 5.81 MIL/uL   Hemoglobin 10.4 (L) 13.0 - 17.0 g/dL   HCT 33.7 (L) 39.0 - 52.0 %   MCV 93.4 78.0 - 100.0 fL   MCH 28.8 26.0 - 34.0 pg   MCHC 30.9 30.0 - 36.0 g/dL   RDW 15.7 (H) 11.5 - 15.5 %   Platelets 335 150 - 400 K/uL   Neutrophils Relative % 68 %   Neutro Abs 5.6 1.7 - 7.7 K/uL   Lymphocytes Relative 12 %   Lymphs Abs 1.0 0.7 - 4.0 K/uL   Monocytes Relative 6 %   Monocytes Absolute 0.5 0.1 - 1.0 K/uL   Eosinophils Relative 13 %   Eosinophils Absolute 1.0 (H) 0.0 - 0.7 K/uL   Basophils Relative 1 %   Basophils Absolute 0.1 0.0 - 0.1 K/uL  Sedimentation rate     Status: Abnormal   Collection Time: 12/28/16  6:36 PM  Result Value Ref Range   Sed Rate 102 (H) 0 - 16 mm/hr  C-reactive protein     Status: Abnormal   Collection Time: 12/28/16  6:36 PM  Result Value Ref Range   CRP 15.6 (H) <1.0 mg/dL    Comment: Performed at [facility], [address] [place], [place] [address]  Prealbumin     Status: Abnormal   Collection Time: 12/28/16  6:36 PM  Result Value Ref Range   Prealbumin 14.2 (L) 18 - 38 mg/dL    Comment: Performed at [facility] [address], [place],  [address]  Hemoglobin A1c     Status: Abnormal   Collection Time: 12/28/16  6:36 PM  Result Value Ref Range   Hgb A1c MFr Bld 7.7 (H) 4.8 - 5.6 %    Comment: (NOTE)         Pre-diabetes: 5.7 - 6.4  Diabetes: >6.4         Glycemic control for adults with diabetes: <7.0    Mean Plasma Glucose 174 mg/dL    Comment: (NOTE) Performed At: [facility] [place], [place] [id number] Lindon Romp MD BP:[id number]   Glucose, capillary     Status: Abnormal   Collection Time: 12/28/16 11:13 PM  Result Value Ref Range    Glucose-Capillary 170 (H) 65 - 99 mg/dL   Comment 1 Notify RN    Comment 2 Document in Chart   MRSA PCR Screening     Status: Abnormal   Collection Time: 12/29/16 12:54 AM  Result Value Ref Range   MRSA by PCR POSITIVE (A) NEGATIVE    Comment:        The GeneXpert MRSA Assay (FDA approved for NASAL specimens only), is one component of a comprehensive MRSA colonization surveillance program. It is not intended to diagnose MRSA infection nor to guide or monitor treatment for MRSA infections. RESULT CALLED TO, READ BACK BY AND VERIFIED WITH: BULLINS,M @ 833 BY MATTHEWS,B   HIV antibody     Status: None   Collection Time: 12/29/16  6:24 AM  Result Value Ref Range   HIV Screen 4th Generation wRfx Non Reactive Non Reactive    Comment: (NOTE) Performed At: [facility] [place], [place] [id number] Lindon Romp MD ER:[id number]   Magnesium     Status: None   Collection Time: 12/29/16  6:24 AM  Result Value Ref Range   Magnesium 1.9 1.7 - 2.4 mg/dL  Basic metabolic panel     Status: Abnormal   Collection Time: 12/29/16  6:24 AM  Result Value Ref Range   Sodium 132 (L) 135 - 145 mmol/L   Potassium 3.2 (L) 3.5 - 5.1 mmol/L   Chloride 92 (L) 101 - 111 mmol/L   CO2 29 22 - 32 mmol/L   Glucose, Bld 190 (H) 65 - 99 mg/dL   BUN 25 (H) 6 - 20 mg/dL   Creatinine, Ser 6.48 (H) 0.61 - 1.24 mg/dL   Calcium 8.2 (L) 8.9 - 10.3 mg/dL   GFR calc non Af Amer 8 (L) >60 mL/min   GFR calc Af Amer 9 (L) >60 mL/min    Comment: (NOTE) The eGFR has been calculated using the CKD EPI equation. This calculation has not been validated in all clinical situations. eGFR's persistently <60 mL/min signify possible Chronic Kidney Disease.    Anion gap 11 5 - 15  CBC WITH DIFFERENTIAL     Status: Abnormal   Collection Time: 12/29/16  6:24 AM  Result Value Ref Range   WBC 8.2 4.0 - 10.5 K/uL   RBC 3.60 (L) 4.22 - 5.81 MIL/uL   Hemoglobin 10.2 (L) 13.0 - 17.0 g/dL   HCT 33.9 (L)  39.0 - 52.0 %   MCV 94.2 78.0 - 100.0 fL   MCH 28.3 26.0 - 34.0 pg   MCHC 30.1 30.0 - 36.0 g/dL   RDW 15.7 (H) 11.5 - 15.5 %   Platelets 331 150 - 400 K/uL   Neutrophils Relative % 64 %   Lymphocytes Relative 12 %   Monocytes Relative 11 %   Eosinophils Relative 12 %   Basophils Relative 1 %   Neutro Abs 5.2 1.7 - 7.7 K/uL   Lymphs Abs 1.0 0.7 - 4.0 K/uL   Monocytes Absolute 0.9 0.1 - 1.0 K/uL   Eosinophils Absolute 1.0 (H) 0.0 - 0.7 K/uL  Basophils Absolute 0.1 0.0 - 0.1 K/uL   WBC Morphology ATYPICAL LYMPHOCYTES   Glucose, capillary     Status: Abnormal   Collection Time: 12/29/16  8:04 AM  Result Value Ref Range   Glucose-Capillary 176 (H) 65 - 99 mg/dL   Comment 1 Notify RN    Comment 2 Document in Chart   Glucose, capillary     Status: Abnormal   Collection Time: 12/29/16 11:57 AM  Result Value Ref Range   Glucose-Capillary 194 (H) 65 - 99 mg/dL   Comment 1 Notify RN    Comment 2 Document in Chart   Glucose, capillary     Status: Abnormal   Collection Time: 12/29/16  6:26 PM  Result Value Ref Range   Glucose-Capillary 132 (H) 65 - 99 mg/dL   Comment 1 Notify RN    Comment 2 Document in Chart   Glucose, capillary     Status: Abnormal   Collection Time: 12/29/16 10:05 PM  Result Value Ref Range   Glucose-Capillary 156 (H) 65 - 99 mg/dL   Comment 1 Notify RN    Comment 2 Document in Chart   Protime-INR     Status: Abnormal   Collection Time: 12/30/16  2:26 AM  Result Value Ref Range   Prothrombin Time 16.3 (H) 11.4 - 15.2 seconds   INR 6.25   Basic metabolic panel     Status: Abnormal   Collection Time: 12/30/16  2:26 AM  Result Value Ref Range   Sodium 130 (L) 135 - 145 mmol/L   Potassium 3.6 3.5 - 5.1 mmol/L   Chloride 92 (L) 101 - 111 mmol/L   CO2 27 22 - 32 mmol/L   Glucose, Bld 169 (H) 65 - 99 mg/dL   BUN 30 (H) 6 - 20 mg/dL   Creatinine, Ser 7.84 (H) 0.61 - 1.24 mg/dL   Calcium 8.2 (L) 8.9 - 10.3 mg/dL   GFR calc non Af Amer 6 (L) >60 mL/min   GFR  calc Af Amer 7 (L) >60 mL/min    Comment: (NOTE) The eGFR has been calculated using the CKD EPI equation. This calculation has not been validated in all clinical situations. eGFR's persistently <60 mL/min signify possible Chronic Kidney Disease.    Anion gap 11 5 - 15  CBC     Status: Abnormal   Collection Time: 12/30/16  2:26 AM  Result Value Ref Range   WBC 9.0 4.0 - 10.5 K/uL    Comment: REPEATED TO VERIFY   RBC 3.54 (L) 4.22 - 5.81 MIL/uL   Hemoglobin 10.0 (L) 13.0 - 17.0 g/dL   HCT 32.9 (L) 39.0 - 52.0 %   MCV 92.9 78.0 - 100.0 fL   MCH 28.2 26.0 - 34.0 pg   MCHC 30.4 30.0 - 36.0 g/dL   RDW 15.8 (H) 11.5 - 15.5 %   Platelets 325 150 - 400 K/uL  Glucose, capillary     Status: Abnormal   Collection Time: 12/30/16  6:14 AM  Result Value Ref Range   Glucose-Capillary 145 (H) 65 - 99 mg/dL  Glucose, capillary     Status: Abnormal   Collection Time: 12/30/16 11:21 AM  Result Value Ref Range   Glucose-Capillary 130 (H) 65 - 99 mg/dL   Comment 1 Notify RN     Dg Foot Complete Right  Result Date: 12/28/2016 CLINICAL DATA:  Right foot swelling, peripheral vascular disease, necrotic 1st and 2nd toe EXAM: RIGHT FOOT COMPLETE - 3+ VIEW COMPARISON:  None. FINDINGS:  No fracture or dislocation is seen. The joint spaces are preserved. Soft tissue ulceration overlying the distal 2nd and 3rd digits. No underlying cortical destruction. IMPRESSION: Soft tissue ulceration overlying the distal 2nd and 3rd digits. No underlying cortical destruction. Electronically Signed   By: Julian Hy M.D.   On: 12/28/2016 19:51    Review of Systems  Constitutional: Negative.   HENT: Negative.   Eyes: Negative.   Respiratory: Negative.   Cardiovascular: Negative.   Gastrointestinal: Negative.   Musculoskeletal:       Some discomfort over right leg with wound/drainage  Skin: Negative.   Neurological: Negative.    Blood pressure (!) 157/94, pulse 75, temperature 98.7 F (37.1 C), temperature  source Oral, resp. rate 20, height 6' 2" (1.88 m), weight (!) 138.3 kg (305 lb), SpO2 100 %. Physical Exam  Nursing note and vitals reviewed. Constitutional: He is oriented to person, place, and time. He appears well-developed and well-nourished. No distress.  HENT:  Head: Normocephalic and atraumatic.  Mouth/Throat: Oropharynx is clear and moist.  Eyes: Conjunctivae are normal. Pupils are equal, round, and reactive to light. No scleral icterus.  Neck: Normal range of motion. Neck supple. No JVD present.  Cardiovascular: Normal rate, regular rhythm and normal heart sounds.   Respiratory: Effort normal and breath sounds normal. He has no wheezes. He has no rales.  Right IJ TDC  GI: Soft. Bowel sounds are normal. There is no tenderness. There is no rebound.  Musculoskeletal: He exhibits no edema.  Right foot with gangrenous changes first and second toe with ulceration over second and third toes. Right brachiocephalic fistula with good thrill.  Neurological: He is alert and oriented to person, place, and time.  Skin: Skin is warm.    Assessment/Plan: 1. Diabetic right foot ulcer with gangrene/cellulitis: On broad-spectrum antibiotic therapy in today to get arteriogram to guide further therapies/level of amputation. Vancomycin with dialysis. 2. End-stage renal disease: Usually on a Monday/Wednesday/Friday dialysis schedule-plan for hemodialysis again today. No acute indications noted on physical exam or labs. His fistula is mature and is already been cannulated successfully as an outpatient following his superficialization procedure 2 months ago---okay to discontinue hemodialysis catheter at this time. 3. Hypertension: Ongoing and hypertensive therapy, monitor with ultrafiltration and hemodialysis. 4. Anemia of chronic kidney disease: Likely compounded by inflammatory component from nonhealing leg ulcer-restart ESA. 5. Metabolic bone disease: Restarted back on Sensipar for PTH suppression, on  sevelamer for phosphorus binding.  Verlinda Slotnick K. 12/30/2016, 12:08 PM

## 2016-12-30 NOTE — Op Note (Signed)
Patient name: Jacob [name] MRN: [id number] DOB: Apr 22, 1951 Sex: male  12/30/2016 Pre-operative Diagnosis: critical right lower extremity ischemia Post-operative diagnosis:  Same Surgeon:  Erlene Quan C. Donzetta Matters, MD Procedure Performed: 1.  US guided cannulation of left common femoral artery 2.  Aortogram with right lower extremity runoff 3.  Drug coated balloon angioplasty of right popliteal artery with 42mm impact admiral 4.  Atherectomy with 1.25 micro csi and pta with 69mm balloon of right pt artery 5.  Atherectomy with 1.25 micro csi and pta with 45mm balloon of right at artery and 58mm balloon of right dp artery 6.  Moderate sedation with fentnyl and versed for 121 minutes 7.  Removal of right IJ tunneled dialysis catheter   Indications:  66 year old male with diabetes and end-stage renal disease on dialysis via right upper extremity AV fistula. He now has necrosis of his right second and third toes and is indicated for the above procedure.  Findings: Aorta iliac segments appear patent without any stenosis. The superficial femoral artery on the right does have diffuse calcification without any flow-limiting stenosis. There is a 90% stenosis of the below-knee popliteal artery on the right. Dominant runoff to the ankle is via the anterior tibial artery that occludes and initially there was no reconstitution of the dorsalis pedis but was later demonstrated. Following intervention on the anterior tibial artery there was 0% residual stenosis filling of the dorsalis pedis artery. The posterior tibial artery is occluded in its midsegment reconstitutes at the level of the ankle. Following intervention on this there is 0% residual stenosis and it does fill the medial and lateral plantar vessels  although these are quite diminutive.     Procedure: The patient was identified in the holding area and taken to room 8.  The patient was then placed supine on the table and prepped and draped in the usual sterile  fashion.  A time out was called.  Ultrasound was used to evaluate the left common femoral artery.  It was patent .  A digital ultrasound image was acquired.  A micropuncture needle was used to access the left common femoral artery under ultrasound guidance.  An 018 wire was advanced without resistance and a micropuncture sheath was placed.  The 018 wire was removed and a benson wire was placed.  The micropuncture sheath was exchanged for a 5 french sheath.  An omniflush catheter was advanced over the wire to the level of L-1.  An abdominal angiogram was obtained.  Next, using the omniflush catheter and a benson wire, the aortic bifurcation was crossed and the catheter was placed into theright external iliac artery and right runoff was obtained.  With the findings above the patient was heparinized and we exchanged for long 6 French sheath into the superficial femoral artery. We used a CXI catheter and V 18 wire to cross the stenosis in the popliteal artery followed by the posterior tibial artery to the level of the foot were redemonstrated intraluminal access. We then performed atherectomy with the 1.25 micro-CSI and balloon angioplasty with 3 mm balloon. We then performed a 3 mm balloon edge plasty of the popliteal followed by 5 mm drug-coated balloon angioplasty. Completion angiography demonstrated the PT running off to the level of the ankle filling diminutive pedal vessels and now we did notice dorsalis pedis artery as well. We then used the CXI catheter and V 18 wire to select the anterior tibial artery all the way to the level of the dorsalis pedis and  confirmed intraluminal access. Atherectomy with 1.25 micro-CSI was performed followed by 2 mm balloon angioplasty of the dorsalis pedis and 3 mm balloon angioplasty of the anterior tibial artery throughout its entirety. Completion angiogram now demonstrated brisk runoff via the anterior tibial artery filling to the level of the toes and there was also runoff via  the posterior tibial artery filling the diminutive medial and lateral plantar vessels. Satisfied with this catheter wires were removed sheath was retracted and the left external iliac artery. The sheath was affixed the skin we then removed the drapes and sterilely prepped and draped in his right chest. We again instilled 1% lidocaine around the existing tunneled catheter. This was then dissected the cuff and removed and pressure held for a period of 10 minutes and a sterile dressing placed. Patient tolerated all this well without immediate complication.  Contrast: 175cc  Jacob [name] C. Donzetta Matters, MD [facility] Office: [phone] Pager: [phone]

## 2016-12-30 NOTE — Progress Notes (Signed)
Site area: lt groin fa sheath Site Prior to Removal:  Level 0 Pressure Applied For:  20 minutes Manual:   yes Patient Status During Pull:  stable Post Pull Site:  Level  0 Post Pull Instructions Given:  yes Post Pull Pulses Present: dopplered Dressing Applied:  Gauze and tegaderm Bedrest begins @ 2297 Comments:

## 2016-12-30 NOTE — Care Management Note (Signed)
Case Management Note  Patient Details  Name: Jacob [name] MRN: [id number] Date of Birth: Dec 19, 1950  Subjective/Objective:                 Spoke with patient at the bedside. He states he lives at home alone. His exwife cleans his home and goes to grocery store for him. He also has a sister that checks in on him. He uses [employer] and sometimes drives to HD in [place] on MWF. He gets meds delivered form [employer]. He uses a walker and or a cane. He denies difficulties obtaining meds or getting to appointments.    Action/Plan:  CM will continue to follow.  Expected Discharge Date:                  Expected Discharge Plan:  Home/Self Care  In-House Referral:     Discharge planning Services  CM Consult  Post Acute Care Choice:    Choice offered to:     DME Arranged:    DME Agency:     HH Arranged:    HH Agency:     Status of Service:  In process, will continue to follow  If discussed at Long Length of Stay Meetings, dates discussed:    Additional Comments:  Carles Collet, RN 12/30/2016, 11:42 AM

## 2016-12-30 NOTE — Progress Notes (Signed)
PROGRESS NOTE    Jacob [name]  YHC:[id number] DOB: [birth year] DOA: 12/28/2016 PCP: Sharilyn Sites, MD   Chief Complaint  Patient presents with  . Nail Problem    Brief Narrative:  HPI on 12/28/2016 by Dr. Christia Reading Jacob [name] Jacob [name] is a 66 y.o. male with medical history significant for hypertension, insulin-dependent diabetes mellitus, chronic normocytic anemia, and end-stage renal disease on hemodialysis, now presenting to the emergency department for evaluation of right foot wound with purulent drainage and foul odor. Patient reports that he noted a small sore on the second right toe several months ago and states that it has slowly, but progressively worsened since that time. This was noted by someone at the dialysis center and he received vancomycin with HD for the past week. Despite this, there continues to be purulent discharge and foul odor from the foot and newer atrophy of the second toe as well as darkening of the forefoot. [name] denies fevers or chills, denies chest pain or palpitations, and denies any increase in his chronic dyspnea. He has not been attempting any interventions for these symptoms, and actually denies any pain from the foot. Assessment & Plan   Diabetic foot ulcers with gangrene/cellulitis -Affecting the right foot first and second metatarsals -Pulse was only obtained by Doppler -X-rays negative for osteomyelitis -Patient currently afebrile with no leukocytosis however has been on vancomycin for the past week with hemodialysis -ESR 102, CRP 15.6 -Vascular surgery consulted and appreciated, possible plan for peripheral vascular catheterization today -Continue aztreonam, vancomycin, Flagyl (patient has penicillin allergy) -Patient will likely need surgical intervention, aspirin has been held during this admission -DVT prophylaxis also held pending possible surgery  End-stage renal disease on hemodialysis -Patient dialyzes Monday, Wednesday, Friday -Nephrology  consultation appreciated -Patient does have HD catheter the right chest, would like this removed -Discussed with Dr. Posey Pronto, patient has been using his fistula, HD catheter may be removed  Diabetes mellitus, type II -Continue Lantus, insulin sliding scale CBG monitoring  Essential hypertension -Continue Coreg, ramipril  Anemia secondary to chronic disease -Hemoglobin currently 10, continue to monitor CBC  DVT Prophylaxis  SCDs  Code Status: Full  Family Communication: None at bedside  Disposition Plan: Admitted. Pending further recommendations from vascular surgery  Consultants Vascular surgery Nephrology  Procedures  None  Antibiotics   Anti-infectives    Start     Dose/Rate Route Frequency Ordered Stop   12/30/16 1200  vancomycin (VANCOCIN) 1,500 mg in sodium chloride 0.9 % 250 mL IVPB     1,500 mg 250 mL/hr over 60 Minutes Intravenous Every M-W-F (Hemodialysis) 12/28/16 2111     12/28/16 2200  aztreonam (AZACTAM) 500 mg in dextrose 5 % 50 mL IVPB     500 mg 100 mL/hr over 30 Minutes Intravenous Every 12 hours 12/28/16 2111     12/28/16 2100  metroNIDAZOLE (FLAGYL) IVPB 500 mg     500 mg 100 mL/hr over 60 Minutes Intravenous Every 8 hours 12/28/16 2050     12/28/16 1815  ciprofloxacin (CIPRO) IVPB 400 mg  Status:  Discontinued     400 mg 200 mL/hr over 60 Minutes Intravenous Every 24 hours 12/28/16 1810 12/28/16 2050      Subjective:   Jacob [name] seen and examined today.  Patient complains of right-sided arm and leg pain. States that this has been somewhat chronic. Is usually getting his pain medications. Denies any chest pain, shortness of breath, abdominal pain, nausea or vomiting, diarrhea or constipation. Would like to  have his chest catheter removed.   Objective:   Vitals:   12/29/16 1300 12/29/16 1748 12/29/16 2210 12/30/16 0300  BP: (!) 119/52 (!) 145/74 (!) 172/69 (!) 157/94  Pulse: 60 71 66 75  Resp: _0 Temp: 97.7 F (36.5 C) 98 F (36.7  C) 97.7 F (36.5 C) 98.7 F (37.1 C)  TempSrc: Oral Oral Oral Oral  SpO2: 99% 96% 100% 100%  Weight:      Height:        Intake/Output Summary (Last 24 hours) at 12/30/16 1100 Last data filed at 12/30/16 0534  Gross per 24 hour  Intake              560 ml  Output                0 ml  Net              560 ml   Filed Weights   12/28/16 1410  Weight: (!) 138.3 kg (305 lb)    Exam  General: Well developed, well nourished, NAD, appears stated age  HEENT: NCAT,  mucous membranes moist.   Cardiovascular: S1 S2 auscultated, no rubs, murmurs or gallops. Regular rate and rhythm.  Respiratory: Clear to auscultation bilaterally with equal chest rise  Abdomen: Soft, nontender, nondistended, + bowel sounds  Extremities: warm dry without cyanosis clubbing. RLE with gangrenous changes to first and second toes. LE- no pitting edema, however "tight" feeling   Neuro: AAOx3, nonfocal  Psych: Normal affect and demeanor with intact judgement and insight   Data Reviewed: I have personally reviewed following labs and imaging studies  CBC:  Recent Labs Lab 12/28/16 1836 12/29/16 0624 12/30/16 0226  WBC 8.1 8.2 9.0  NEUTROABS 5.6 5.2  --   HGB 10.4* 10.2* 10.0*  HCT 33.7* 33.9* 32.9*  MCV 93.4 94.2 92.9  PLT 335 331 017   Basic Metabolic Panel:  Recent Labs Lab 12/28/16 1836 12/29/16 0624 12/30/16 0226  NA 132* 132* 130*  K 3.3* 3.2* 3.6  CL 93* 92* 92*  CO2 _1 GLUCOSE 146* 190* 169*  BUN 22* 25* 30*  CREATININE 5.87* 6.48* 7.84*  CALCIUM 8.5* 8.2* 8.2*  MG  --  1.9  --    GFR: Estimated Creatinine Clearance: 13.5 mL/min (A) (by C-G formula based on SCr of 7.84 mg/dL (H)). Liver Function Tests:  Recent Labs Lab 12/28/16 1836  AST 18  ALT 5*  ALKPHOS 141*  BILITOT 0.4  PROT 9.0*  ALBUMIN 2.9*   No results for input(s): LIPASE, AMYLASE in the last 168 hours. No results for input(s): AMMONIA in the last 168 hours. Coagulation Profile:  Recent  Labs Lab 12/30/16 0226  INR 1.30   Cardiac Enzymes: No results for input(s): CKTOTAL, CKMB, CKMBINDEX, TROPONINI in the last 168 hours. BNP (last 3 results) No results for input(s): PROBNP in the last 8760 hours. HbA1C:  Recent Labs  12/28/16 1836  HGBA1C 7.7*   CBG:  Recent Labs Lab 12/29/16 0804 12/29/16 1157 12/29/16 1826 12/29/16 2205 12/30/16 0614  GLUCAP 176* 194* 132* 156* 145*   Lipid Profile: No results for input(s): CHOL, HDL, LDLCALC, TRIG, CHOLHDL, LDLDIRECT in the last 72 hours. Thyroid Function Tests: No results for input(s): TSH, T4TOTAL, FREET4, T3FREE, THYROIDAB in the last 72 hours. Anemia Panel: No results for input(s): VITAMINB12, FOLATE, FERRITIN, TIBC, IRON, RETICCTPCT in the last 72 hours. Urine analysis: No results found for: COLORURINE, APPEARANCEUR, [place], [place], [place],  HGBUR, BILIRUBINUR, KETONESUR, PROTEINUR, UROBILINOGEN, NITRITE, LEUKOCYTESUR Sepsis Labs: _0 (procalcitonin:4,lacticidven:4)  ) Recent Results (from the past 240 hour(s))  MRSA PCR Screening     Status: Abnormal   Collection Time: 12/29/16 12:54 AM  Result Value Ref Range Status   MRSA by PCR POSITIVE (A) NEGATIVE Final    Comment:        The GeneXpert MRSA Assay (FDA approved for NASAL specimens only), is one component of a comprehensive MRSA colonization surveillance program. It is not intended to diagnose MRSA infection nor to guide or monitor treatment for MRSA infections. RESULT CALLED TO, READ BACK BY AND VERIFIED WITH: BULLINS,M @ 833 BY MATTHEWS,B       Radiology Studies: Dg Foot Complete Right  Result Date: 12/28/2016 CLINICAL DATA:  Right foot swelling, peripheral vascular disease, necrotic 1st and 2nd toe EXAM: RIGHT FOOT COMPLETE - 3+ VIEW COMPARISON:  None. FINDINGS: No fracture or dislocation is seen. The joint spaces are preserved. Soft tissue ulceration overlying the distal 2nd and 3rd digits. No underlying cortical destruction.  IMPRESSION: Soft tissue ulceration overlying the distal 2nd and 3rd digits. No underlying cortical destruction. Electronically Signed   By: Julian Hy M.D.   On: 12/28/2016 19:51     Scheduled Meds: . carvedilol  6.25 mg Oral BID WC  . Chlorhexidine Gluconate Cloth  6 each Topical Q0600  . cinacalcet  30 mg Oral Q breakfast  . collagenase   Topical Daily  . insulin aspart  0-15 Units Subcutaneous TID WC  . insulin aspart  0-5 Units Subcutaneous QHS  . insulin glargine  10 Units Subcutaneous Daily  . multivitamin  1 tablet Oral Q M,W,F  . mupirocin ointment  1 application Nasal BID  . pantoprazole  40 mg Oral Daily  . ramipril  10 mg Oral Daily  . sevelamer carbonate  2,400 mg Oral TID WC   Continuous Infusions: . aztreonam Stopped (12/30/16 0210)  . metronidazole 500 mg (12/30/16 0527)  . vancomycin       LOS: 2 days   Time Spent in minutes   30 minutes  Albie Bazin D.O. on 12/30/2016 at 11:00 AM  Between 7am to 7pm - Pager - [phone]  After 7pm go to [email or web address] - password [email or web address]  And look for the night coverage person covering for me after hours  [facility] Office  [phone]

## 2016-12-30 NOTE — Consult Note (Signed)
           [facility] CM Primary Care Navigator  12/30/2016  LYALL FACIANE 17-Aug-1950 [id number]  Went to see patientat the bedside to identify possible discharge needsbut RN reports that patient is in Cath Lab for a procedure at this time (Aortogram).  Will attempt to meet with patient at another time when he is available in the room.   For questions, please contact:  Dannielle Huh, BSN, RN- [facility] Primary Care Navigator  Telephone: [phone] [place]

## 2016-12-30 NOTE — Progress Notes (Signed)
  Progress Note    12/30/2016 12:08 PM * No surgery date entered *  Subjective:  No acute issues  Vitals:   12/29/16 2210 12/30/16 0300  BP: (!) 172/69 (!) 157/94  Pulse: 66 75  Resp: 18 20  Temp: 97.7 F (36.5 C) 98.7 F (37.1 C)    Physical Exam: [id number] Non labored respirations Dressing to right is cdi Thrill in right upper arm avf Palpable bilateral femoral pulses  CBC    Component Value Date/Time   WBC 9.0 12/30/2016 0226   RBC 3.54 (L) 12/30/2016 0226   HGB 10.0 (L) 12/30/2016 0226   HCT 32.9 (L) 12/30/2016 0226   PLT 325 12/30/2016 0226   MCV 92.9 12/30/2016 0226   MCH 28.2 12/30/2016 0226   MCHC 30.4 12/30/2016 0226   RDW 15.8 (H) 12/30/2016 0226   LYMPHSABS 1.0 12/29/2016 0624   MONOABS 0.9 12/29/2016 0624   EOSABS 1.0 (H) 12/29/2016 0624   BASOSABS 0.1 12/29/2016 0624    BMET    Component Value Date/Time   NA 130 (L) 12/30/2016 0226   K 3.6 12/30/2016 0226   CL 92 (L) 12/30/2016 0226   CO2 27 12/30/2016 0226   GLUCOSE 169 (H) 12/30/2016 0226   BUN 30 (H) 12/30/2016 0226   CREATININE 7.84 (H) 12/30/2016 0226   CALCIUM 8.2 (L) 12/30/2016 0226   GFRNONAA 6 (L) 12/30/2016 0226   GFRAA 7 (L) 12/30/2016 0226    INR    Component Value Date/Time   INR 1.30 12/30/2016 0226     Intake/Output Summary (Last 24 hours) at 12/30/16 1208 Last data filed at 12/30/16 0534  Gross per 24 hour  Intake              560 ml  Output                0 ml  Net              560 ml    IMPRESSION: Soft tissue ulceration overlying the distal 2nd and 3rd digits.  No underlying cortical destruction.   Assessment:  66 y.o. male with esrd, dm with gangren to right 2nd and 3rd toes  Plan: Aortogram with bilateral runoff and possible intervention on right today.  Discussed procedure details and likely need for amputation of toes in the future and he agrees to proceed.    Geovanna Simko C. Donzetta Matters, MD [facility] Office:  [phone] Pager: [phone]  12/30/2016 12:08 PM

## 2016-12-30 NOTE — Progress Notes (Signed)
Patient arrived to unit per bed.  Reviewed treatment plan and this RN agrees.  Report received from bedside RN, Antonique.  Consent obtained.  Patient A & O X 4. Lung sounds diminished to ausculation in all fields. BLE +1 edema. Cardiac: NSR.  Prepped RUAVF with alcohol and cannulated with two 15 gauge needles.  Pulsation of blood noted.  Flushed access well with saline per protocol.  Connected and secured lines and initiated tx at 2026.  UF goal of 3000 mL and net fluid removal of 2500 mL.  Will continue to monitor.

## 2016-12-31 ENCOUNTER — Encounter (HOSPITAL_COMMUNITY): Payer: Self-pay | Admitting: Vascular Surgery

## 2016-12-31 ENCOUNTER — Inpatient Hospital Stay (HOSPITAL_COMMUNITY): Payer: Medicare Other

## 2016-12-31 DIAGNOSIS — E876 Hypokalemia: Secondary | ICD-10-CM

## 2016-12-31 DIAGNOSIS — I739 Peripheral vascular disease, unspecified: Secondary | ICD-10-CM

## 2016-12-31 DIAGNOSIS — N186 End stage renal disease: Secondary | ICD-10-CM | POA: Diagnosis not present

## 2016-12-31 DIAGNOSIS — Z992 Dependence on renal dialysis: Secondary | ICD-10-CM | POA: Diagnosis not present

## 2016-12-31 LAB — GLUCOSE, CAPILLARY
GLUCOSE-CAPILLARY: 173 mg/dL — AB (ref 65–99)
GLUCOSE-CAPILLARY: 174 mg/dL — AB (ref 65–99)
GLUCOSE-CAPILLARY: 141 mg/dL — AB (ref 65–99)
GLUCOSE-CAPILLARY: 139 mg/dL — AB (ref 65–99)
GLUCOSE-CAPILLARY: 97 mg/dL (ref 65–99)

## 2016-12-31 LAB — CBC
HEMATOCRIT: 31.6 % — AB (ref 39.0–52.0)
HEMOGLOBIN: 9.6 g/dL — AB (ref 13.0–17.0)
MCH: 28.1 pg (ref 26.0–34.0)
MCHC: 30.4 g/dL (ref 30.0–36.0)
MCV: 92.4 fL (ref 78.0–100.0)
Platelets: 363 10*3/uL (ref 150–400)
RBC: 3.42 MIL/uL — AB (ref 4.22–5.81)
RDW: 16 % — ABNORMAL HIGH (ref 11.5–15.5)
WBC: 7.6 10*3/uL (ref 4.0–10.5)

## 2016-12-31 LAB — BASIC METABOLIC PANEL
ANION GAP: 10 (ref 5–15)
BUN: 15 mg/dL (ref 6–20)
CHLORIDE: 93 mmol/L — AB (ref 101–111)
CO2: 27 mmol/L (ref 22–32)
Calcium: 7.8 mg/dL — ABNORMAL LOW (ref 8.9–10.3)
Creatinine, Ser: 5.32 mg/dL — ABNORMAL HIGH (ref 0.61–1.24)
GFR calc Af Amer: 12 mL/min — ABNORMAL LOW (ref >60)
GFR, EST NON AFRICAN AMERICAN: 10 mL/min — AB (ref >60)
GLUCOSE: 213 mg/dL — AB (ref 65–99)
POTASSIUM: 3.5 mmol/L (ref 3.5–5.1)
Sodium: 130 mmol/L — ABNORMAL LOW (ref 135–145)

## 2016-12-31 LAB — HEPATITIS B SURFACE ANTIGEN: Hepatitis B Surface Ag: NEGATIVE

## 2016-12-31 MED ORDER — VANCOMYCIN HCL IN DEXTROSE 1-5 GM/200ML-% IV SOLN
1000.0000 mg | INTRAVENOUS | Status: DC
  Administered 2017-01-01: 1 g via INTRAVENOUS
  Filled 2016-12-31: qty 200

## 2016-12-31 NOTE — Progress Notes (Addendum)
[facility] Progress Note  Subjective    No complaints this am.   Objective Vitals:   12/31/16 0117 12/31/16 0604  BP: (!) 119/51 (!) 127/43  Pulse: 76 82  Resp: 18 18  Temp: 98.2 F (36.8 C) 98.6 F (37 C)    Intake/Output Summary (Last 24 hours) at 12/31/16 0819 Last data filed at 12/31/16 0030  Gross per 24 hour  Intake                0 ml  Output             2500 ml  Net            -2500 ml   Left groin without hematoma  Assessment/Planning: 66 y.o. male is s/p:  1.  US guided cannulation of left common femoral artery 2.  Aortogram with right lower extremity runoff 3.  Drug coated balloon angioplasty of right popliteal artery with 24mm impact admiral 4.  Atherectomy with 1.25 micro csi and pta with 57mm balloon of right pt artery 5.  Atherectomy with 1.25 micro csi and pta with 75mm balloon of right at artery and 54mm balloon of right dp artery 6.  Moderate sedation with fentnyl and versed for 121 minutes 7.  Removal of right IJ tunneled dialysis catheter  1 Day Post-Op   On plavix.  Plan for right transmetatarsal amputation tomorrow with Dr. Trula Slade. NPO past midnight. Obtain consent.   [name] 12/31/2016 8:19 AM --  Laboratory CBC    Component Value Date/Time   WBC 7.6 12/31/2016 0220   HGB 9.6 (L) 12/31/2016 0220   HCT 31.6 (L) 12/31/2016 0220   PLT 363 12/31/2016 0220    BMET    Component Value Date/Time   NA 130 (L) 12/31/2016 0220   K 3.5 12/31/2016 0220   CL 93 (L) 12/31/2016 0220   CO2 27 12/31/2016 0220   GLUCOSE 213 (H) 12/31/2016 0220   BUN 15 12/31/2016 0220   CREATININE 5.32 (H) 12/31/2016 0220   CALCIUM 7.8 (L) 12/31/2016 0220   GFRNONAA 10 (L) 12/31/2016 0220   GFRAA 12 (L) 12/31/2016 0220    COAG Lab Results  Component Value Date   INR 1.30 12/30/2016   No results found for: PTT  Antibiotics Anti-infectives    Start     Dose/Rate Route Frequency Ordered Stop   12/30/16 1200  vancomycin  (VANCOCIN) 1,500 mg in sodium chloride 0.9 % 250 mL IVPB     1,500 mg 250 mL/hr over 60 Minutes Intravenous Every M-W-F (Hemodialysis) 12/28/16 2111     12/28/16 2200  aztreonam (AZACTAM) 500 mg in dextrose 5 % 50 mL IVPB     500 mg 100 mL/hr over 30 Minutes Intravenous Every 12 hours 12/28/16 2111     12/28/16 2100  metroNIDAZOLE (FLAGYL) IVPB 500 mg     500 mg 100 mL/hr over 60 Minutes Intravenous Every 8 hours 12/28/16 2050     12/28/16 1815  ciprofloxacin (CIPRO) IVPB 400 mg  Status:  Discontinued     400 mg 200 mL/hr over 60 Minutes Intravenous Every 24 hours 12/28/16 1810 12/28/16 2050       [name], PA-C [facility] Office: [phone] Pager: [phone] 12/31/2016 8:19 AM  I agree with the above.  I have seen and examined the patient.  He continues to have gangrenous changes to the right toes 1,2,3,5.  He had an excellent result from angio yesterday.  I discussed  proceeding with a right TMA tomorrow.  He understands that this may not heal, and that he may need a more proximal amputation.  Annamarie Major

## 2016-12-31 NOTE — Care Management Note (Signed)
Case Management Note  Patient Details  Name: Jacob [name] MRN: [id number] Date of Birth: 1950/12/01  Subjective/Objective:                 Plan for metatarsal amputation 6/1. See extensive note from [facility] case management regarding home follow up. Anticipate HH needs, will be reassessed by PT after surgery.   Action/Plan:  CM will continue to follow.  Expected Discharge Date:                  Expected Discharge Plan:     In-House Referral:     Discharge planning Services  CM Consult  Post Acute Care Choice:    Choice offered to:     DME Arranged:    DME Agency:     HH Arranged:    HH Agency:     Status of Service:  In process, will continue to follow  If discussed at Long Length of Stay Meetings, dates discussed:    Additional Comments:  Carles Collet, RN 12/31/2016, 2:19 PM

## 2016-12-31 NOTE — Progress Notes (Signed)
PROGRESS NOTE    [name]  YIR:[id number] DOB: 09-29-1950 DOA: 12/28/2016 PCP: Sharilyn Sites, MD   Chief Complaint  Patient presents with  . Nail Problem    Brief Narrative:  HPI on 12/28/2016 by Dr. Christia Reading Opyd Jacob [name] is a 66 y.o. male with medical history significant for hypertension, insulin-dependent diabetes mellitus, chronic normocytic anemia, and end-stage renal disease on hemodialysis, now presenting to the emergency department for evaluation of right foot wound with purulent drainage and foul odor. Patient reports that he noted a small sore on the second right toe several months ago and states that it has slowly, but progressively worsened since that time. This was noted by someone at the dialysis center and he received vancomycin with HD for the past week. Despite this, there continues to be purulent discharge and foul odor from the foot and newer atrophy of the second toe as well as darkening of the forefoot. [name] denies fevers or chills, denies chest pain or palpitations, and denies any increase in his chronic dyspnea. He has not been attempting any interventions for these symptoms, and actually denies any pain from the foot. Assessment & Plan   Diabetic foot ulcers with gangrene/cellulitis -Affecting the right foot first and second metatarsals -X-rays negative for osteomyelitis -Patient currently afebrile with no leukocytosis however has been on vancomycin for the past week with hemodialysis -ESR 102, CRP 15.6 -Vascular surgery consulted and appreciated, s/p aortogram with runoff, intervention (see complete list under procedures) -Continue aztreonam, vancomycin, Flagyl (patient has penicillin allergy) -Plan for right metatarsal amputation on 01/01/2017 -Continue plavix -ABI done on 12/31/2016: Right 2.4/1.54, left 1.04  End-stage renal disease on hemodialysis -Patient dialyzes Monday, Wednesday, Friday -Nephrology consultation appreciated -Discussed with Dr.  Posey Pronto, patient has been using his fistula, HD catheter may be removed -HD catheter removed on 12/30/2016  Diabetes mellitus, type II -Continue Lantus, insulin sliding scale CBG monitoring  Essential hypertension -Continue Coreg, ramipril  Anemia secondary to chronic disease -Hemoglobin currently 9.6, continue to monitor CBC  DVT Prophylaxis  SCDs  Code Status: Full  Family Communication: None at bedside  Disposition Plan: Admitted. Pending amputation on 01/01/2017. Home possibly in 2-3 days.  Consultants Vascular surgery Nephrology  Procedures  US guided cannulation of left common femoral artery;  Aortogram with right lower extremity runoff; Drug coated balloon angioplasty of right popliteal artery with 74m impact admiral; Atherectomy with 1.25 micro csi and pta with 377mballoon of right pt artery; Atherectomy with 1.25 micro csi and pta with 41m41malloon of right at artery and 2mm45mlloon of right dp artery; Moderate sedation with fentnyl and versed for 121 minutes; Removal of right IJ tunneled dialysis catheter  ABI  Antibiotics   Anti-infectives    Start     Dose/Rate Route Frequency Ordered Stop   01/01/17 1200  vancomycin (VANCOCIN) IVPB 1000 mg/200 mL premix     1,000 mg 200 mL/hr over 60 Minutes Intravenous Every M-W-F (Hemodialysis) 12/31/16 1038     12/30/16 1200  vancomycin (VANCOCIN) 1,500 mg in sodium chloride 0.9 % 250 mL IVPB  Status:  Discontinued     1,500 mg 250 mL/hr over 60 Minutes Intravenous Every M-W-F (Hemodialysis) 12/28/16 2111 12/31/16 1038   12/28/16 2200  aztreonam (AZACTAM) 500 mg in dextrose 5 % 50 mL IVPB     500 mg 100 mL/hr over 30 Minutes Intravenous Every 12 hours 12/28/16 2111     12/28/16 2100  metroNIDAZOLE (FLAGYL) IVPB 500 mg  500 mg 100 mL/hr over 60 Minutes Intravenous Every 8 hours 12/28/16 2050     12/28/16 1815  ciprofloxacin (CIPRO) IVPB 400 mg  Status:  Discontinued     400 mg 200 mL/hr over 60 Minutes Intravenous Every 24  hours 12/28/16 1810 12/28/16 2050      Subjective:   Jacob [name] seen and examined today.  Patient states his leg pain has improved. Upset about possible amputation however understands necessity. Denies chest pain, shortness of breath, abdominal pain, nausea or vomiting.   Objective:   Vitals:   12/31/16 0030 12/31/16 0117 12/31/16 0135 12/31/16 0604  BP: (!) 168/76 (!) 119/51  (!) 127/43  Pulse: 79 76  82  Resp:  18  18  Temp:  98.2 F (36.8 C)  98.6 F (37 C)  TempSrc:  Oral  Oral  SpO2:  99%  99%  Weight:   132.9 kg (293 lb)   Height:        Intake/Output Summary (Last 24 hours) at 12/31/16 1309 Last data filed at 12/31/16 1100  Gross per 24 hour  Intake              840 ml  Output             2500 ml  Net            -1660 ml   Filed Weights   12/30/16 2010 12/31/16 0026 12/31/16 0135  Weight: (!) 139.7 kg (307 lb 15.7 oz) (!) 137.2 kg (302 lb 7.5 oz) 132.9 kg (293 lb)   Exam  General: Well developed, well nourished, NAD, appears stated age  109: NCAT,mucous membranes moist.   Cardiovascular: S1 S2 auscultated, no rubs, murmurs or gallops. Regular rate and rhythm.  Respiratory: Clear to auscultation bilaterally with equal chest rise  Abdomen: Soft, nontender, nondistended, + bowel sounds  Extremities: warm dry without cyanosis clubbing. Trace edema LE (tight). Gangrenous right 2nd and 3rd digits with ulceration.  Neuro: AAOx3, nonfocal  Psych: Normal affect and demeanor with intact judgement and insight  Data Reviewed: I have personally reviewed following labs and imaging studies  CBC:  Recent Labs Lab 12/28/16 1836 12/29/16 0624 12/30/16 0226 12/30/16 2041 12/31/16 0220  WBC 8.1 8.2 9.0 7.9 7.6  NEUTROABS 5.6 5.2  --   --   --   HGB 10.4* 10.2* 10.0* 9.6* 9.6*  HCT 33.7* 33.9* 32.9* 31.1* 31.6*  MCV 93.4 94.2 92.9 91.2 92.4  PLT 335 331 325 370 950   Basic Metabolic Panel:  Recent Labs Lab 12/28/16 1836 12/29/16 0624 12/30/16 0226  12/30/16 2040 12/31/16 0220  NA 132* 132* 130* 130* 130*  K 3.3* 3.2* 3.6 3.6 3.5  CL 93* 92* 92* 94* 93*  CO2 _0 GLUCOSE 146* 190* 169* 138* 213*  BUN 22* 25* 30* 34* 15  CREATININE 5.87* 6.48* 7.84* 8.97* 5.32*  CALCIUM 8.5* 8.2* 8.2* 8.0* 7.8*  MG  --  1.9  --   --   --   PHOS  --   --   --  3.8  --    GFR: Estimated Creatinine Clearance: 19.5 mL/min (A) (by C-G formula based on SCr of 5.32 mg/dL (H)). Liver Function Tests:  Recent Labs Lab 12/28/16 1836 12/30/16 2040  AST 18  --   ALT 5*  --   ALKPHOS 141*  --   BILITOT 0.4  --   PROT 9.0*  --   ALBUMIN 2.9* 2.4*  No results for input(s): LIPASE, AMYLASE in the last 168 hours. No results for input(s): AMMONIA in the last 168 hours. Coagulation Profile:  Recent Labs Lab 12/30/16 0226  INR 1.30   Cardiac Enzymes: No results for input(s): CKTOTAL, CKMB, CKMBINDEX, TROPONINI in the last 168 hours. BNP (last 3 results) No results for input(s): PROBNP in the last 8760 hours. HbA1C:  Recent Labs  12/28/16 1836  HGBA1C 7.7*   CBG:  Recent Labs Lab 12/30/16 1121 12/30/16 1603 12/31/16 0108 12/31/16 0603 12/31/16 1117  GLUCAP 130* 153* 173* 174* 141*   Lipid Profile: No results for input(s): CHOL, HDL, LDLCALC, TRIG, CHOLHDL, LDLDIRECT in the last 72 hours. Thyroid Function Tests: No results for input(s): TSH, T4TOTAL, FREET4, T3FREE, THYROIDAB in the last 72 hours. Anemia Panel: No results for input(s): VITAMINB12, FOLATE, FERRITIN, TIBC, IRON, RETICCTPCT in the last 72 hours. Urine analysis: No results found for: COLORURINE, APPEARANCEUR, [place], [place], GLUCOSEU, HGBUR, BILIRUBINUR, KETONESUR, PROTEINUR, UROBILINOGEN, NITRITE, LEUKOCYTESUR Sepsis Labs: _0 (procalcitonin:4,lacticidven:4)  ) Recent Results (from the past 240 hour(s))  MRSA PCR Screening     Status: Abnormal   Collection Time: 12/29/16 12:54 AM  Result Value Ref Range Status   MRSA by PCR POSITIVE (A)  NEGATIVE Final    Comment:        The GeneXpert MRSA Assay (FDA approved for NASAL specimens only), is one component of a comprehensive MRSA colonization surveillance program. It is not intended to diagnose MRSA infection nor to guide or monitor treatment for MRSA infections. RESULT CALLED TO, READ BACK BY AND VERIFIED WITH: BULLINS,M @ [facility]       Radiology Studies: No results found.   Scheduled Meds: . carvedilol  6.25 mg Oral BID WC  . Chlorhexidine Gluconate Cloth  6 each Topical Q0600  . cinacalcet  30 mg Oral Q breakfast  . clopidogrel  75 mg Oral Q breakfast  . collagenase   Topical Daily  . insulin aspart  0-15 Units Subcutaneous TID WC  . insulin aspart  0-5 Units Subcutaneous QHS  . insulin glargine  10 Units Subcutaneous Daily  . multivitamin  1 tablet Oral Q M,W,F  . mupirocin ointment  1 application Nasal BID  . pantoprazole  40 mg Oral Daily  . ramipril  10 mg Oral Daily  . sevelamer carbonate  2,400 mg Oral TID WC   Continuous Infusions: . aztreonam Stopped (12/31/16 1100)  . metronidazole Stopped (12/31/16 1000)  . [START ON 01/01/2017] vancomycin       LOS: 3 days   Time Spent in minutes   30 minutes  Jayln Madeira D.O. on 12/31/2016 at 1:09 PM  Between 7am to 7pm - Pager - [phone]  After 7pm go to [email or web address] - password [email or web address]  And look for the night coverage person covering for me after hours  [facility] Office  [phone]

## 2016-12-31 NOTE — Progress Notes (Signed)
Dialysis treatment completed.  3000 mL ultrafiltrated and net fluid removal 2500 mL.    Patient status unchanged. Lung sounds diminished to ausculation in all fields. Generalized edema. Cardiac: NSR.  Disconnected lines and removed needles.  Pressure held for 10 minutes and band aid/gauze dressing applied.  Report given to bedside RN, Antonique.

## 2016-12-31 NOTE — Progress Notes (Signed)
VASCULAR LAB PRELIMINARY  ARTERIAL  ABI completed:ABIs maybe falsely elevated secondary to calcified vessels.     RIGHT    LEFT    PRESSURE WAVEFORM  PRESSURE WAVEFORM  BRACHIAL Dialysis access  BRACHIAL 125 T  DP   DP    AT >300 B AT 69 M  PT 192 B PT 130 M  PER   PER    GREAT TOE  NA GREAT TOE  NA    RIGHT LEFT  ABI 2.4/1.54 1.04     Gokul Waybright, RVT 12/31/2016, 11:12 AM

## 2016-12-31 NOTE — Consult Note (Signed)
           [facility] CM Primary Care Navigator  12/31/2016  Jacob [name] Sep 20, 1950 [id number]    Patient seen at the bedside to identify possibledischarge needs. Patient reports having right foot wound that progressively worsened and with purulent drainage which hadled to this admission/ surgery. Patient endorses Dr. Sharilyn [name] with [facility] as his primary care provider.   Patient shared using [facility] [facility] in [place] obtain medications without difficulty so far.  Patient reports managing hisown medications at home straight out of the containers.  Hereports using [facility] transportation to dialysis M-W-F or sister Jacob [name]- who lives close by) provides transportation to his doctors'appointments.  Patient is living alone and independent with self care. He mentioned that his ex-wife comes and cleans his home, wash clothes and goes to grocery store for him. His sister also checks in on him as stated.  Discharge plan is still to be determined pending surgery (amputation) planned for tomorrow per patient. He is hoping to be discharged to a rehab facility before going home.  Patient voiced understanding to call primary care provider's office, when he returns back home,for a post discharge follow-up appointment within a week or sooner if needs arise.Patient letter (with PCP's contact number) was provided as a reminder.   Discussed with patient regarding [facility] CMservices available for health management and patient states that he is "able to manage DM so far, been doing that for 20 years". Patient reports checking blood sugar and recording results at home, administering insulin (Lantus) using insulin pen and primary care provider has been monitoring him with recent A1c of 7.7.  Primary care provider's office called and spoke to [facility] regarding patient's health issues needing follow-up, need for post discharge follow-up and transition of care. Made  aware to refer patient to [facility] care management if deemed necessary/ appropriate for services as well.  For questions, please contact:  Jacob [name], BSN, RN- [facility] Primary Care Navigator  Telephone: [phone] [place]

## 2016-12-31 NOTE — Progress Notes (Signed)
Patient ID: Jacob [name], male   DOB: [birth year], 66 y.o.   MRN: [id number]  [facility] Progress Note   Assessment/ Plan:   1. Diabetic right foot ulcer with gangrene/cellulitis-Peripheral vascular disease: On broad-spectrum antibiotic coverage with vancomycin at dialysis. Underwent arteriogram yesterday and had DCB angioplasty of right popliteal artery with atherectomy of right PT, right AT as well as right DP artery. Further management per vascular surgery likely to include right TMA tomorrow. 2. End-stage renal disease: Usually on a Monday/Wednesday/Friday dialysis schedule-and underwent hemodialysis late yesterday/early today. We will order for hemodialysis again tomorrow without heparin. 3. Hypertension: Blood pressures improved overnight with antihypertensive therapy/ultrafiltration with hemodialysis. 4. Anemia of chronic kidney disease: Continue ESA with hemodialysis, no indications for PRBCs. 5. Metabolic bone disease: Restarted back on Sensipar for PTH suppression, on sevelamer for phosphorus binding. Subjective:   Reports to be feeling fair overnight-got back from dialysis around 1 AM and complains of some shoulder stiffness this morning    Objective:   BP (!) 127/43 (BP Location: Left Arm)   Pulse 82   Temp 98.6 F (37 C) (Oral)   Resp 18   Ht 6\' 2"  (1.88 m)   Wt 132.9 kg (293 lb)   SpO2 99%   BMI 37.62 kg/m   Physical Exam: [id number] sitting on the side of his bed, eating breakfast CVS: Pulse regular rhythm, normal rate, S1 and S2 normal Resp: Clear to auscultation bilaterally, no rales/rhonchi Abd: Soft, obese, nontender Ext: Trace right ankle edema-right foot with dusky 3 medial toes, ulceration over second and third toes. Right BCF.  Labs: BMET  Recent Labs Lab 12/28/16 1836 12/29/16 0624 12/30/16 0226 12/30/16 2040 12/31/16 0220  NA 132* 132* 130* 130* 130*  K 3.3* 3.2* 3.6 3.6 3.5  CL 93* 92* 92* 94* 93*  CO2 28 29 27 24 27   GLUCOSE  146* 190* 169* 138* 213*  BUN 22* 25* 30* 34* 15  CREATININE 5.87* 6.48* 7.84* 8.97* 5.32*  CALCIUM 8.5* 8.2* 8.2* 8.0* 7.8*  PHOS  --   --   --  3.8  --    CBC  Recent Labs Lab 12/28/16 1836 12/29/16 0624 12/30/16 0226 12/30/16 2041 12/31/16 0220  WBC 8.1 8.2 9.0 7.9 7.6  NEUTROABS 5.6 5.2  --   --   --   HGB 10.4* 10.2* 10.0* 9.6* 9.6*  HCT 33.7* 33.9* 32.9* 31.1* 31.6*  MCV 93.4 94.2 92.9 91.2 92.4  PLT 335 331 325 370 363   Medications:    . carvedilol  6.25 mg Oral BID WC  . Chlorhexidine Gluconate Cloth  6 each Topical Q0600  . cinacalcet  30 mg Oral Q breakfast  . clopidogrel  75 mg Oral Q breakfast  . collagenase   Topical Daily  . insulin aspart  0-15 Units Subcutaneous TID WC  . insulin aspart  0-5 Units Subcutaneous QHS  . insulin glargine  10 Units Subcutaneous Daily  . multivitamin  1 tablet Oral Q M,W,F  . mupirocin ointment  1 application Nasal BID  . pantoprazole  40 mg Oral Daily  . ramipril  10 mg Oral Daily  . sevelamer carbonate  2,400 mg Oral TID WC   Elmarie Shiley, MD 12/31/2016, 8:29 AM

## 2017-01-01 ENCOUNTER — Inpatient Hospital Stay (HOSPITAL_COMMUNITY): Payer: Medicare Other | Admitting: Certified Registered"

## 2017-01-01 ENCOUNTER — Encounter (HOSPITAL_COMMUNITY): Payer: Self-pay | Admitting: Certified Registered"

## 2017-01-01 ENCOUNTER — Encounter (HOSPITAL_COMMUNITY)
Admission: EM | Disposition: A | Discharge status: Skilled Nursing Facility | Payer: Self-pay | Source: Home / Self Care | Attending: Internal Medicine

## 2017-01-01 DIAGNOSIS — I70261 Atherosclerosis of native arteries of extremities with gangrene, right leg: Secondary | ICD-10-CM

## 2017-01-01 HISTORY — PX: TRANSMETATARSAL AMPUTATION: SHX6197

## 2017-01-01 LAB — BASIC METABOLIC PANEL
ANION GAP: 10 (ref 5–15)
BUN: 28 mg/dL — ABNORMAL HIGH (ref 6–20)
CALCIUM: 8.1 mg/dL — AB (ref 8.9–10.3)
CHLORIDE: 94 mmol/L — AB (ref 101–111)
CO2: 26 mmol/L (ref 22–32)
Creatinine, Ser: 7.16 mg/dL — ABNORMAL HIGH (ref 0.61–1.24)
GFR calc non Af Amer: 7 mL/min — ABNORMAL LOW (ref >60)
GFR, EST AFRICAN AMERICAN: 8 mL/min — AB (ref >60)
Glucose, Bld: 151 mg/dL — ABNORMAL HIGH (ref 65–99)
Potassium: 3.9 mmol/L (ref 3.5–5.1)
SODIUM: 130 mmol/L — AB (ref 135–145)

## 2017-01-01 LAB — POCT I-STAT 4, (NA,K, GLUC, HGB,HCT)
Glucose, Bld: 100 mg/dL — ABNORMAL HIGH (ref 65–99)
HEMATOCRIT: 35 % — AB (ref 39.0–52.0)
HEMOGLOBIN: 11.9 g/dL — AB (ref 13.0–17.0)
POTASSIUM: 3.9 mmol/L (ref 3.5–5.1)
SODIUM: 135 mmol/L (ref 135–145)

## 2017-01-01 LAB — GLUCOSE, CAPILLARY
Glucose-Capillary: 154 mg/dL — ABNORMAL HIGH (ref 65–99)
Glucose-Capillary: 98 mg/dL (ref 65–99)
Glucose-Capillary: 135 mg/dL — ABNORMAL HIGH (ref 65–99)

## 2017-01-01 LAB — CBC
HCT: 30.8 % — ABNORMAL LOW (ref 39.0–52.0)
HEMOGLOBIN: 9.4 g/dL — AB (ref 13.0–17.0)
MCH: 28.1 pg (ref 26.0–34.0)
MCHC: 30.5 g/dL (ref 30.0–36.0)
MCV: 92.2 fL (ref 78.0–100.0)
Platelets: 357 10*3/uL (ref 150–400)
RBC: 3.34 MIL/uL — AB (ref 4.22–5.81)
RDW: 15.7 % — ABNORMAL HIGH (ref 11.5–15.5)
WBC: 8.7 10*3/uL (ref 4.0–10.5)

## 2017-01-01 LAB — HEPATITIS B SURFACE ANTIBODY,QUALITATIVE: Hep B S Ab: REACTIVE

## 2017-01-01 LAB — HEPATITIS B CORE ANTIBODY, TOTAL: Hep B Core Total Ab: NEGATIVE

## 2017-01-01 LAB — VANCOMYCIN, RANDOM: Vancomycin Rm: 25

## 2017-01-01 SURGERY — AMPUTATION, FOOT, TRANSMETATARSAL
Anesthesia: General | Site: Foot | Laterality: Right

## 2017-01-01 MED ORDER — HYDROCODONE-ACETAMINOPHEN 5-325 MG PO TABS
ORAL_TABLET | ORAL | Status: AC
  Filled 2017-01-01: qty 1

## 2017-01-01 MED ORDER — PROTAMINE SULFATE 10 MG/ML IV SOLN
INTRAVENOUS | Status: AC
  Filled 2017-01-01: qty 5

## 2017-01-01 MED ORDER — FENTANYL CITRATE (PF) 250 MCG/5ML IJ SOLN
INTRAMUSCULAR | Status: AC
  Filled 2017-01-01: qty 5

## 2017-01-01 MED ORDER — ALBUMIN HUMAN 5 % IV SOLN
INTRAVENOUS | Status: DC | PRN
  Administered 2017-01-01: 16:00:00 via INTRAVENOUS

## 2017-01-01 MED ORDER — DIPHENHYDRAMINE HCL 25 MG PO CAPS
ORAL_CAPSULE | ORAL | Status: AC
  Filled 2017-01-01: qty 1

## 2017-01-01 MED ORDER — LIDOCAINE 2% (20 MG/ML) 5 ML SYRINGE
INTRAMUSCULAR | Status: AC
  Filled 2017-01-01: qty 5

## 2017-01-01 MED ORDER — MIDAZOLAM HCL 5 MG/5ML IJ SOLN
INTRAMUSCULAR | Status: DC | PRN
  Administered 2017-01-01: 2 mg via INTRAVENOUS

## 2017-01-01 MED ORDER — PROPOFOL 10 MG/ML IV BOLUS
INTRAVENOUS | Status: AC
  Filled 2017-01-01: qty 20

## 2017-01-01 MED ORDER — DIPHENHYDRAMINE HCL 25 MG PO CAPS
25.0000 mg | ORAL_CAPSULE | Freq: Once | ORAL | Status: AC
  Administered 2017-01-01: 25 mg via ORAL

## 2017-01-01 MED ORDER — EPHEDRINE 5 MG/ML INJ
INTRAVENOUS | Status: AC
  Filled 2017-01-01: qty 10

## 2017-01-01 MED ORDER — MIDAZOLAM HCL 2 MG/2ML IJ SOLN
INTRAMUSCULAR | Status: AC
  Filled 2017-01-01: qty 2

## 2017-01-01 MED ORDER — DOCUSATE SODIUM 100 MG PO CAPS
100.0000 mg | ORAL_CAPSULE | Freq: Two times a day (BID) | ORAL | Status: DC
  Administered 2017-01-01 – 2017-01-02 (×2): 100 mg via ORAL
  Filled 2017-01-01 (×4): qty 1

## 2017-01-01 MED ORDER — ONDANSETRON HCL 4 MG/2ML IJ SOLN
INTRAMUSCULAR | Status: DC | PRN
  Administered 2017-01-01: 4 mg via INTRAVENOUS

## 2017-01-01 MED ORDER — ONDANSETRON HCL 4 MG/2ML IJ SOLN
INTRAMUSCULAR | Status: AC
Start: 2017-01-01 — End: 2017-01-01
  Filled 2017-01-01: qty 2

## 2017-01-01 MED ORDER — EPHEDRINE SULFATE-NACL 50-0.9 MG/10ML-% IV SOSY
PREFILLED_SYRINGE | INTRAVENOUS | Status: DC | PRN
  Administered 2017-01-01: 15 mg via INTRAVENOUS
  Administered 2017-01-01 (×2): 10 mg via INTRAVENOUS

## 2017-01-01 MED ORDER — LIDOCAINE 2% (20 MG/ML) 5 ML SYRINGE
INTRAMUSCULAR | Status: DC | PRN
  Administered 2017-01-01: 60 mg via INTRAVENOUS

## 2017-01-01 MED ORDER — VANCOMYCIN HCL IN DEXTROSE 1-5 GM/200ML-% IV SOLN
INTRAVENOUS | Status: AC
  Administered 2017-01-01: 1 g via INTRAVENOUS
  Filled 2017-01-01: qty 200

## 2017-01-01 MED ORDER — ONDANSETRON HCL 4 MG/2ML IJ SOLN: 4.0000 mg | Freq: Once | INTRAMUSCULAR | Status: DC | PRN

## 2017-01-01 MED ORDER — PHENYLEPHRINE 40 MCG/ML (10ML) SYRINGE FOR IV PUSH (FOR BLOOD PRESSURE SUPPORT)
PREFILLED_SYRINGE | INTRAVENOUS | Status: AC
  Filled 2017-01-01: qty 10

## 2017-01-01 MED ORDER — BISACODYL 5 MG PO TBEC
5.0000 mg | DELAYED_RELEASE_TABLET | Freq: Once | ORAL | Status: AC
  Administered 2017-01-01: 5 mg via ORAL
  Filled 2017-01-01: qty 1

## 2017-01-01 MED ORDER — PROPOFOL 10 MG/ML IV BOLUS
INTRAVENOUS | Status: DC | PRN
  Administered 2017-01-01: 200 mg via INTRAVENOUS

## 2017-01-01 MED ORDER — FENTANYL CITRATE (PF) 100 MCG/2ML IJ SOLN: 25.0000 ug | INTRAMUSCULAR | Status: DC | PRN

## 2017-01-01 MED ORDER — SODIUM CHLORIDE 0.9 % IV SOLN
INTRAVENOUS | Status: DC | PRN
  Administered 2017-01-01: 15:00:00 via INTRAVENOUS

## 2017-01-01 MED ORDER — 0.9 % SODIUM CHLORIDE (POUR BTL) OPTIME
TOPICAL | Status: DC | PRN
  Administered 2017-01-01: 1000 mL

## 2017-01-01 MED ORDER — PHENYLEPHRINE 40 MCG/ML (10ML) SYRINGE FOR IV PUSH (FOR BLOOD PRESSURE SUPPORT)
PREFILLED_SYRINGE | INTRAVENOUS | Status: DC | PRN
  Administered 2017-01-01 (×5): 80 ug via INTRAVENOUS

## 2017-01-01 SURGICAL SUPPLY — 37 items
BANDAGE ACE 4X5 VEL STRL LF (GAUZE/BANDAGES/DRESSINGS) ×3 IMPLANT
BLADE AVERAGE 25MMX9MM (BLADE)
BLADE AVERAGE 25X9 (BLADE) IMPLANT
BLADE SAW SGTL 81X20 HD (BLADE) ×5 IMPLANT
BNDG CONFORM 3 STRL LF (GAUZE/BANDAGES/DRESSINGS) IMPLANT
BNDG GAUZE ELAST 4 BULKY (GAUZE/BANDAGES/DRESSINGS) ×3 IMPLANT
CANISTER SUCT 3000ML PPV (MISCELLANEOUS) ×3 IMPLANT
COVER SURGICAL LIGHT HANDLE (MISCELLANEOUS) ×3 IMPLANT
DRAPE EXTREMITY T 121X128X90 (DRAPE) ×3 IMPLANT
DRAPE HALF SHEET 40X57 (DRAPES) ×3 IMPLANT
DRSG VAC ATS SM SENSATRAC (GAUZE/BANDAGES/DRESSINGS) ×2 IMPLANT
ELECT REM PT RETURN 9FT ADLT (ELECTROSURGICAL) ×3
ELECTRODE REM PT RTRN 9FT ADLT (ELECTROSURGICAL) ×1 IMPLANT
GAUZE SPONGE 4X4 12PLY STRL (GAUZE/BANDAGES/DRESSINGS) ×3 IMPLANT
GLOVE BIOGEL PI IND STRL 7.5 (GLOVE) ×1 IMPLANT
GLOVE BIOGEL PI INDICATOR 7.5 (GLOVE) ×2
GLOVE SURG SS PI 7.5 STRL IVOR (GLOVE) ×3 IMPLANT
GOWN STRL REUS W/ TWL LRG LVL3 (GOWN DISPOSABLE) ×2 IMPLANT
GOWN STRL REUS W/ TWL XL LVL3 (GOWN DISPOSABLE) ×1 IMPLANT
GOWN STRL REUS W/TWL LRG LVL3 (GOWN DISPOSABLE) ×6
GOWN STRL REUS W/TWL XL LVL3 (GOWN DISPOSABLE) ×3
KIT BASIN OR (CUSTOM PROCEDURE TRAY) ×3 IMPLANT
KIT PREVENA INCISION MGT20CM45 (CANNISTER) ×2 IMPLANT
KIT ROOM TURNOVER OR (KITS) ×3 IMPLANT
NDL HYPO 25GX1X1/2 BEV (NEEDLE) IMPLANT
NEEDLE HYPO 25GX1X1/2 BEV (NEEDLE) IMPLANT
NS IRRIG 1000ML POUR BTL (IV SOLUTION) ×3 IMPLANT
PACK GENERAL/GYN (CUSTOM PROCEDURE TRAY) ×3 IMPLANT
PAD ARMBOARD 7.5X6 YLW CONV (MISCELLANEOUS) ×6 IMPLANT
SPECIMEN JAR SMALL (MISCELLANEOUS) ×3 IMPLANT
SUT ETHILON 3 0 PS 1 (SUTURE) ×3 IMPLANT
SWAB CULTURE ESWAB REG 1ML (MISCELLANEOUS) IMPLANT
SYR CONTROL 10ML LL (SYRINGE) IMPLANT
TOWEL OR 17X24 6PK STRL BLUE (TOWEL DISPOSABLE) ×3 IMPLANT
TOWEL OR 17X26 10 PK STRL BLUE (TOWEL DISPOSABLE) ×3 IMPLANT
UNDERPAD 30X30 (UNDERPADS AND DIAPERS) ×3 IMPLANT
WATER STERILE IRR 1000ML POUR (IV SOLUTION) ×3 IMPLANT

## 2017-01-01 NOTE — Anesthesia Procedure Notes (Signed)
Procedure Name: LMA Insertion Date/Time: 01/01/2017 3:43 PM Performed by: Melina Copa, Tashi Andujo R Pre-anesthesia Checklist: Patient identified, Emergency Drugs available, Suction available and Patient being monitored Patient Re-evaluated:Patient Re-evaluated prior to inductionOxygen Delivery Method: Circle System Utilized Preoxygenation: Pre-oxygenation with 100% oxygen Intubation Type: IV induction Ventilation: Mask ventilation without difficulty LMA: LMA inserted LMA Size: 5.0 Number of attempts: 1 Placement Confirmation: positive ETCO2 Tube secured with: Tape Dental Injury: Teeth and Oropharynx as per pre-operative assessment

## 2017-01-01 NOTE — Procedures (Signed)
Patient seen on Hemodialysis. QB 375, UF goal 2.5L Treatment adjusted as needed.  Elmarie Shiley MD [facility]. Office # [phone] Pager # [phone] 9:49 AM

## 2017-01-01 NOTE — Interval H&P Note (Signed)
History and Physical Interval Note:  01/01/2017 2:51 PM  Jacob [name]  has presented today for surgery, with the diagnosis of Diabetic with Peripheral Vascular Complications E00.63 Right Foot Toe Ulcers L97.509  The various methods of treatment have been discussed with the patient and family. After consideration of risks, benefits and other options for treatment, the patient has consented to  Procedure(s): TRANSMETATARSAL AMPUTATION (Right) as a surgical intervention .  The patient's history has been reviewed, patient examined, no change in status, stable for surgery.  I have reviewed the patient's chart and labs.  Questions were answered to the patient's satisfaction.     Annamarie Major

## 2017-01-01 NOTE — Progress Notes (Signed)
Pharmacy Antibiotic Note  Jacob [name] is a 66 y.o. male admitted on 12/28/2016 with R foot wound with purulent drainage and foul odor.  Pt was started on Aztreonam, Vancomycin, and Flagyl for likely polymicrobial gangrenous foot infection.  Pt has ESRD and dialyzes on MWF.  Noted plans for OR today for amputation.    Pre-HD Vancomycin level = 25 (goal 15-25)  Plan: Continue Vancomycin 1g post HD Follow-up after OR to make sure additional Vancomycin doses not given during procedure. Follow-up cxc data and length of therapy.  Height: 6\' 2"  (188 cm) Weight: 295 lb 3.1 oz (133.9 kg) IBW/kg (Calculated) : 82.2  Temp (24hrs), Avg:97.9 F (36.6 C), Min:97.5 F (36.4 C), Max:98.3 F (36.8 C)   Recent Labs Lab 12/29/16 0624 12/30/16 0226 12/30/16 2040 12/30/16 2041 12/31/16 0220 01/01/17 0352  WBC 8.2 9.0  --  7.9 7.6 8.7  CREATININE 6.48* 7.84* 8.97*  --  5.32* 7.16*  VANCORANDOM  --   --   --   --   --  25    Estimated Creatinine Clearance: 14.6 mL/min (A) (by C-G formula based on SCr of 7.16 mg/dL (H)).    Allergies  Allergen Reactions  . Bee Venom Anaphylaxis  . Penicillins Swelling and Other (See Comments)    SWELLING REACTION UNSPECIFIED   Has patient had a PCN reaction causing immediate rash, facial/tongue/throat swelling, SOB or lightheadedness with hypotension: No Has patient had a PCN reaction causing severe rash involving mucus membranes or skin necrosis: No Has patient had a PCN reaction that required hospitalization No Has patient had a PCN reaction occurring within the last 10 years: No If all of the above answers are "NO", then may proceed with Cephalosporin use.    Antimicrobials this admission: Azactam 5/28 >>  Vanc 5/28 >>  *5/28 1500mg  at outpt HD *5/30 1500mg  at [facility] HD *6/1 0400 Vancomycin Random  = 25 *6/1 1000mg  at [facility] HD *6/1 OR  Flagyl 5/29 >>  Dose adjustments this admission:   Microbiology results: 5/29 MRSA PCR- positive  Thank you  for allowing pharmacy to be a part of this patient's care.  [employer], Pharm.D., BCPS Clinical Pharmacist Pager: [phone] Clinical phone for 01/01/2017 from 8:30-4:00 is [phone]. After 4pm, please call Main Rx [phone]) for assistance. 01/01/2017 1:30 PM

## 2017-01-01 NOTE — Transfer of Care (Signed)
Immediate Anesthesia Transfer of Care Note  Patient: Jacob [name]  Procedure(s) Performed: Procedure(s): TRANSMETATARSAL AMPUTATION (Right)  Patient Location: PACU  Anesthesia Type:General  Level of Consciousness: awake and oriented  Airway & Oxygen Therapy: Patient Spontanous Breathing and Patient connected to nasal cannula oxygen  Post-op Assessment: Report given to RN, Post -op Vital signs reviewed and stable and Patient moving all extremities  Post vital signs: Reviewed and stable  Last Vitals:  Vitals:   01/01/17 1200 01/01/17 1711  BP: (!) 166/84 (!) 142/71  Pulse: 79 83  Resp: 16 18  Temp: 36.6 C     Last Pain:  Vitals:   01/01/17 1200  TempSrc: Oral  PainSc: 6       Patients Stated Pain Goal: 2 (28/24/17 5301)  Complications: No apparent anesthesia complications

## 2017-01-01 NOTE — Op Note (Signed)
    Patient name: Jacob [name] MRN: [id number] DOB: [birth year] Sex: male  12/28/2016 - 01/01/2017 Pre-operative Diagnosis: Ischemic right foot with gangrene  Post-operative diagnosis:  Same Surgeon:  Annamarie Major Assistants:  none Procedure:   #1:  Right trans metatarsal amputation   #2:  Placement of Provena wound vac Anesthesia:  Gen Blood Loss:  See anesthesia record Specimens:  none  Findings:  Good capillary bleeding, bone was healthy  Indications:  The patient presented with open infected wound to right toes 1,2,3,5 with gangrene.  He underwent percutaneous revascularization, and is now here for amputation  Procedure:  The patient was identified in the holding area and taken to [place] [address]  The patient was then placed supine on the table. general anesthesia was administered.  The patient was prepped and draped in the usual sterile fashion.  A time out was called and antibiotics were administered.  A fishmouth incision was made in the mid forefoot, proximal to all non-viable tissue.  The incision was carried down to the bone.  An elevator was used to elevated the periosteum.  An oscillating saw was used to transect the bone of all 5 metatarsal bones.  A #10 blade was used to complete the amputation.  The forefoot was removed.  There was good capillary bleeding.  Hemostasis was achieved.  A rasp was ised to smooth the bone edges.The incision was closed with interrupted vertical mattress 3-0 nylon sutures.  A provena wound vac was placed across the incision   Disposition:  To PACU stable   V. Annamarie Major, M.D. [facility] Office: [phone] Pager:  [phone]

## 2017-01-01 NOTE — Progress Notes (Addendum)
PROGRESS NOTE    AMMAAR ENCINA  QQI:[id number] DOB: 06/20/1951 DOA: 12/28/2016 PCP: Sharilyn Sites, MD   Chief Complaint  Patient presents with  . Nail Problem    Brief Narrative:  HPI on 12/28/2016 by Dr. Christia Reading Opyd WINTER Jacob [name] is a 66 y.o. male with medical history significant for hypertension, insulin-dependent diabetes mellitus, chronic normocytic anemia, and end-stage renal disease on hemodialysis, now presenting to the emergency department for evaluation of right foot wound with purulent drainage and foul odor. Patient reports that he noted a small sore on the second right toe several months ago and states that it has slowly, but progressively worsened since that time. This was noted by someone at the dialysis center and he received vancomycin with HD for the past week. Despite this, there continues to be purulent discharge and foul odor from the foot and newer atrophy of the second toe as well as darkening of the forefoot. [name] denies fevers or chills, denies chest pain or palpitations, and denies any increase in his chronic dyspnea. He has not been attempting any interventions for these symptoms, and actually denies any pain from the foot.  Interim history S/p aortogram. Planned for metatarsal amputation today.  Assessment & Plan   Diabetic foot ulcers with gangrene/cellulitis -Affecting the right foot first and second metatarsals -X-rays negative for osteomyelitis -Patient currently afebrile with no leukocytosis however has been on vancomycin for the past week with hemodialysis -ESR 102, CRP 15.6 -Vascular surgery consulted and appreciated, s/p aortogram with runoff, intervention (see complete list under procedures) and plan for right metatarsal amputation today 01/01/2017 -Continue aztreonam, vancomycin, Flagyl (patient has penicillin allergy) -Continue plavix -ABI done on 12/31/2016: Right 2.4/1.54, left 1.04  End-stage renal disease on hemodialysis -Patient dialyzes Monday,  Wednesday, Friday -Nephrology consultation appreciated -Discussed with Dr. Posey Pronto, patient has been using his fistula, HD catheter may be removed -HD catheter removed on 12/30/2016 -HD today  Diabetes mellitus, type II -Continue Lantus, insulin sliding scale CBG monitoring  Essential hypertension -Continue Coreg, ramipril  Anemia secondary to chronic disease -Hemoglobin currently 9.4, continue to monitor CBC  Constipation -Patient has miralax and dulcolax ordered PRN. Will make these scheduled and on colace. -Suspect secondary to pain medications and recent immobility   DVT Prophylaxis  SCDs  Code Status: Full  Family Communication: None at bedside  Disposition Plan: Admitted. Plan for surgery today. Possibly home within 2-3 days.   Consultants Vascular surgery Nephrology  Procedures  US guided cannulation of left common femoral artery;  Aortogram with right lower extremity runoff; Drug coated balloon angioplasty of right popliteal artery with 76m impact admiral; Atherectomy with 1.25 micro csi and pta with 362mballoon of right pt artery; Atherectomy with 1.25 micro csi and pta with 52m61malloon of right at artery and 2mm77mlloon of right dp artery; Moderate sedation with fentnyl and versed for 121 minutes; Removal of right IJ tunneled dialysis catheter  ABI  Antibiotics   Anti-infectives    Start     Dose/Rate Route Frequency Ordered Stop   01/01/17 1200  vancomycin (VANCOCIN) IVPB 1000 mg/200 mL premix     1,000 mg 200 mL/hr over 60 Minutes Intravenous Every M-W-F (Hemodialysis) 12/31/16 1038     12/30/16 1200  vancomycin (VANCOCIN) 1,500 mg in sodium chloride 0.9 % 250 mL IVPB  Status:  Discontinued     1,500 mg 250 mL/hr over 60 Minutes Intravenous Every M-W-F (Hemodialysis) 12/28/16 2111 12/31/16 1038   12/28/16 2200  aztreonam (AZACTAM)  500 mg in dextrose 5 % 50 mL IVPB     500 mg 100 mL/hr over 30 Minutes Intravenous Every 12 hours 12/28/16 2111     12/28/16 2100   metroNIDAZOLE (FLAGYL) IVPB 500 mg     500 mg 100 mL/hr over 60 Minutes Intravenous Every 8 hours 12/28/16 2050     12/28/16 1815  ciprofloxacin (CIPRO) IVPB 400 mg  Status:  Discontinued     400 mg 200 mL/hr over 60 Minutes Intravenous Every 24 hours 12/28/16 1810 12/28/16 2050      Subjective:   [name] seen and examined today in hemodialysis. Feels some leg pain. Anxious about his upcoming surgery. Denies chest pain, shortness of breath, abdominal pain, nausea, vomiting. Complains of constipation.   Objective:   Vitals:   01/01/17 0900 01/01/17 0930 01/01/17 1000 01/01/17 1030  BP: (!) 186/86 (!) 147/70 (!) 151/75 (!) 153/87  Pulse: 70 69 71 76  Resp:      Temp:      TempSrc:      SpO2:      Weight:      Height:        Intake/Output Summary (Last 24 hours) at 01/01/17 1053 Last data filed at 12/31/16 2224  Gross per 24 hour  Intake              920 ml  Output                0 ml  Net              920 ml   Filed Weights   12/31/16 0135 01/01/17 0554 01/01/17 0728  Weight: 132.9 kg (293 lb) 134.7 kg (297 lb) 135.9 kg (299 lb 9.7 oz)   Exam  General: Well developed, well nourished, no distress  HEENT: NCAT, mucous membranes moist.   Cardiovascular: S1 S2 auscultated, RRR, no murmurs appreciated  Respiratory: Clear to auscultation bilaterally   Abdomen: Soft, obese, nontender, nondistended, + bowel sounds  Extremities: warm dry without cyanosis clubbing. Right foot with dressing. LE edema (mild)  Neuro: AAOx3, nonfocal  Psych: Appropriate mood and affect  Data Reviewed: I have personally reviewed following labs and imaging studies  CBC:  Recent Labs Lab 12/28/16 1836 12/29/16 3664 12/30/16 0226 12/30/16 2041 12/31/16 0220 01/01/17 0352  WBC 8.1 8.2 9.0 7.9 7.6 8.7  NEUTROABS 5.6 5.2  --   --   --   --   HGB 10.4* 10.2* 10.0* 9.6* 9.6* 9.4*  HCT 33.7* 33.9* 32.9* 31.1* 31.6* 30.8*  MCV 93.4 94.2 92.9 91.2 92.4 92.2  PLT 335 331 325 370 363 403    Basic Metabolic Panel:  Recent Labs Lab 12/29/16 0624 12/30/16 0226 12/30/16 2040 12/31/16 0220 01/01/17 0352  NA 132* 130* 130* 130* 130*  K 3.2* 3.6 3.6 3.5 3.9  CL 92* 92* 94* 93* 94*  CO2 _0 GLUCOSE 190* 169* 138* 213* 151*  BUN 25* 30* 34* 15 28*  CREATININE 6.48* 7.84* 8.97* 5.32* 7.16*  CALCIUM 8.2* 8.2* 8.0* 7.8* 8.1*  MG 1.9  --   --   --   --   PHOS  --   --  3.8  --   --    GFR: Estimated Creatinine Clearance: 14.7 mL/min (A) (by C-G formula based on SCr of 7.16 mg/dL (H)). Liver Function Tests:  Recent Labs Lab 12/28/16 1836 12/30/16 2040  AST 18  --   ALT 5*  --  ALKPHOS 141*  --   BILITOT 0.4  --   PROT 9.0*  --   ALBUMIN 2.9* 2.4*   No results for input(s): LIPASE, AMYLASE in the last 168 hours. No results for input(s): AMMONIA in the last 168 hours. Coagulation Profile:  Recent Labs Lab 12/30/16 0226  INR 1.30   Cardiac Enzymes: No results for input(s): CKTOTAL, CKMB, CKMBINDEX, TROPONINI in the last 168 hours. BNP (last 3 results) No results for input(s): PROBNP in the last 8760 hours. HbA1C: No results for input(s): HGBA1C in the last 72 hours. CBG:  Recent Labs Lab 12/31/16 0603 12/31/16 1117 12/31/16 1648 12/31/16 2159 01/01/17 0600  GLUCAP 174* 141* 139* 97 154*   Lipid Profile: No results for input(s): CHOL, HDL, LDLCALC, TRIG, CHOLHDL, LDLDIRECT in the last 72 hours. Thyroid Function Tests: No results for input(s): TSH, T4TOTAL, FREET4, T3FREE, THYROIDAB in the last 72 hours. Anemia Panel: No results for input(s): VITAMINB12, FOLATE, FERRITIN, TIBC, IRON, RETICCTPCT in the last 72 hours. Urine analysis: No results found for: COLORURINE, APPEARANCEUR, [place], [place], GLUCOSEU, HGBUR, BILIRUBINUR, KETONESUR, PROTEINUR, UROBILINOGEN, NITRITE, LEUKOCYTESUR Sepsis Labs: _0 (procalcitonin:4,lacticidven:4)  ) Recent Results (from the past 240 hour(s))  MRSA PCR Screening     Status: Abnormal    Collection Time: 12/29/16 12:54 AM  Result Value Ref Range Status   MRSA by PCR POSITIVE (A) NEGATIVE Final    Comment:        The GeneXpert MRSA Assay (FDA approved for NASAL specimens only), is one component of a comprehensive MRSA colonization surveillance program. It is not intended to diagnose MRSA infection nor to guide or monitor treatment for MRSA infections. RESULT CALLED TO, READ BACK BY AND VERIFIED WITH: BULLINS,M @ [employer]       Radiology Studies: No results found.   Scheduled Meds: . carvedilol  6.25 mg Oral BID WC  . Chlorhexidine Gluconate Cloth  6 each Topical Q0600  . cinacalcet  30 mg Oral Q breakfast  . clopidogrel  75 mg Oral Q breakfast  . collagenase   Topical Daily  . HYDROcodone-acetaminophen      . insulin aspart  0-15 Units Subcutaneous TID WC  . insulin aspart  0-5 Units Subcutaneous QHS  . insulin glargine  10 Units Subcutaneous Daily  . multivitamin  1 tablet Oral Q M,W,F  . mupirocin ointment  1 application Nasal BID  . pantoprazole  40 mg Oral Daily  . ramipril  10 mg Oral Daily  . sevelamer carbonate  2,400 mg Oral TID WC   Continuous Infusions: . aztreonam Stopped (12/31/16 2259)  . metronidazole Stopped (01/01/17 0320)  . vancomycin       LOS: 4 days   Time Spent in minutes   30 minutes  Maritta Kief D.O. on 01/01/2017 at 10:53 AM  Between 7am to 7pm - Pager - [phone]  After 7pm go to [email or web address] - password [email or web address]  And look for the night coverage person covering for me after hours  [facility] Office  [phone]

## 2017-01-01 NOTE — Anesthesia Preprocedure Evaluation (Addendum)
Anesthesia Evaluation  Patient identified by MRN, date of birth, ID band Patient awake    Reviewed: Allergy & Precautions, NPO status , Patient's Chart, lab work & pertinent test results  History of Anesthesia Complications Negative for: history of anesthetic complications  Airway Mallampati: II  TM Distance: >3 FB Neck ROM: Full    Dental  (+) Dental Advisory Given, Edentulous Upper, Loose, Poor Dentition,    Pulmonary shortness of breath and with exertion,    breath sounds clear to auscultation       Cardiovascular hypertension, Pt. on home beta blockers and Pt. on medications  Rhythm:Regular Rate:Normal  ECG: SR, rate 85 ECHO: Left ventricle: Systolic function was normal. The estimated ejection fraction was in the range of 55% to 60%. - Mitral valve: Mildly calcified annulus. Mildly thickened leaflets    Neuro/Psych PSYCHIATRIC DISORDERS Anxiety Depression CVA, No Residual Symptoms    GI/Hepatic GERD  Medicated and Controlled,  Endo/Other  diabetes, Insulin Dependent  Renal/GU ESRF and DialysisRenal diseaseOn HD M,W,F     Musculoskeletal  (+) Arthritis ,   Abdominal   Peds  Hematology  (+) anemia ,   Anesthesia Other Findings Obese: BMI, 38  Reproductive/Obstetrics                            Anesthesia Physical  Anesthesia Plan  ASA: IV  Anesthesia Plan: General   Post-op Pain Management:    Induction: Intravenous  Airway Management Planned: LMA  Additional Equipment:   Intra-op Plan:   Post-operative Plan:   Informed Consent: I have reviewed the patients History and Physical, chart, labs and discussed the procedure including the risks, benefits and alternatives for the proposed anesthesia with the patient or authorized representative who has indicated his/her understanding and acceptance.   Dental advisory given  Plan Discussed with: CRNA, Anesthesiologist and  Surgeon  Anesthesia Plan Comments:        Anesthesia Quick Evaluation

## 2017-01-01 NOTE — H&P (View-Only) (Signed)
[facility] Progress Note  Subjective    No complaints this am.   Objective Vitals:   12/31/16 0117 12/31/16 0604  BP: (!) 119/51 (!) 127/43  Pulse: 76 82  Resp: 18 18  Temp: 98.2 F (36.8 C) 98.6 F (37 C)    Intake/Output Summary (Last 24 hours) at 12/31/16 0819 Last data filed at 12/31/16 0030  Gross per 24 hour  Intake                0 ml  Output             2500 ml  Net            -2500 ml   Left groin without hematoma  Assessment/Planning: 66 y.o. male is s/p:  1.  US guided cannulation of left common femoral artery 2.  Aortogram with right lower extremity runoff 3.  Drug coated balloon angioplasty of right popliteal artery with 39mm impact admiral 4.  Atherectomy with 1.25 micro csi and pta with 66mm balloon of right pt artery 5.  Atherectomy with 1.25 micro csi and pta with 49mm balloon of right at artery and 44mm balloon of right dp artery 6.  Moderate sedation with fentnyl and versed for 121 minutes 7.  Removal of right IJ tunneled dialysis catheter  1 Day Post-Op   On plavix.  Plan for right transmetatarsal amputation tomorrow with Dr. Trula Slade. NPO past midnight. Obtain consent.   [name] 12/31/2016 8:19 AM --  Laboratory CBC    Component Value Date/Time   WBC 7.6 12/31/2016 0220   HGB 9.6 (L) 12/31/2016 0220   HCT 31.6 (L) 12/31/2016 0220   PLT 363 12/31/2016 0220    BMET    Component Value Date/Time   NA 130 (L) 12/31/2016 0220   K 3.5 12/31/2016 0220   CL 93 (L) 12/31/2016 0220   CO2 27 12/31/2016 0220   GLUCOSE 213 (H) 12/31/2016 0220   BUN 15 12/31/2016 0220   CREATININE 5.32 (H) 12/31/2016 0220   CALCIUM 7.8 (L) 12/31/2016 0220   GFRNONAA 10 (L) 12/31/2016 0220   GFRAA 12 (L) 12/31/2016 0220    COAG Lab Results  Component Value Date   INR 1.30 12/30/2016   No results found for: PTT  Antibiotics Anti-infectives    Start     Dose/Rate Route Frequency Ordered Stop   12/30/16 1200  vancomycin  (VANCOCIN) 1,500 mg in sodium chloride 0.9 % 250 mL IVPB     1,500 mg 250 mL/hr over 60 Minutes Intravenous Every M-W-F (Hemodialysis) 12/28/16 2111     12/28/16 2200  aztreonam (AZACTAM) 500 mg in dextrose 5 % 50 mL IVPB     500 mg 100 mL/hr over 30 Minutes Intravenous Every 12 hours 12/28/16 2111     12/28/16 2100  metroNIDAZOLE (FLAGYL) IVPB 500 mg     500 mg 100 mL/hr over 60 Minutes Intravenous Every 8 hours 12/28/16 2050     12/28/16 1815  ciprofloxacin (CIPRO) IVPB 400 mg  Status:  Discontinued     400 mg 200 mL/hr over 60 Minutes Intravenous Every 24 hours 12/28/16 1810 12/28/16 2050       [name], PA-C [facility] Office: [phone] Pager: [phone] 12/31/2016 8:19 AM  I agree with the above.  I have seen and examined the patient.  He continues to have gangrenous changes to the right toes 1,2,3,5.  He had an excellent result from angio yesterday.  I discussed  proceeding with a right TMA tomorrow.  He understands that this may not heal, and that he may need a more proximal amputation.  Annamarie Major

## 2017-01-02 ENCOUNTER — Inpatient Hospital Stay (HOSPITAL_COMMUNITY): Payer: Medicare Other

## 2017-01-02 DIAGNOSIS — R569 Unspecified convulsions: Secondary | ICD-10-CM

## 2017-01-02 LAB — BASIC METABOLIC PANEL
ANION GAP: 10 (ref 5–15)
ANION GAP: 10 (ref 5–15)
BUN: 17 mg/dL (ref 6–20)
BUN: 22 mg/dL — ABNORMAL HIGH (ref 6–20)
CALCIUM: 8.1 mg/dL — AB (ref 8.9–10.3)
CALCIUM: 8.3 mg/dL — AB (ref 8.9–10.3)
CO2: 25 mmol/L (ref 22–32)
CO2: 25 mmol/L (ref 22–32)
CREATININE: 5.29 mg/dL — AB (ref 0.61–1.24)
CREATININE: 6.07 mg/dL — AB (ref 0.61–1.24)
Chloride: 91 mmol/L — ABNORMAL LOW (ref 101–111)
Chloride: 94 mmol/L — ABNORMAL LOW (ref 101–111)
GFR calc Af Amer: 10 mL/min — ABNORMAL LOW (ref >60)
GFR, EST AFRICAN AMERICAN: 12 mL/min — AB (ref >60)
GFR, EST NON AFRICAN AMERICAN: 10 mL/min — AB (ref >60)
GFR, EST NON AFRICAN AMERICAN: 9 mL/min — AB (ref >60)
GLUCOSE: 120 mg/dL — AB (ref 65–99)
Glucose, Bld: 132 mg/dL — ABNORMAL HIGH (ref 65–99)
Potassium: 4.2 mmol/L (ref 3.5–5.1)
Potassium: 4.7 mmol/L (ref 3.5–5.1)
Sodium: 126 mmol/L — ABNORMAL LOW (ref 135–145)
Sodium: 129 mmol/L — ABNORMAL LOW (ref 135–145)

## 2017-01-02 LAB — CBC
HEMATOCRIT: 32.7 % — AB (ref 39.0–52.0)
Hemoglobin: 9.7 g/dL — ABNORMAL LOW (ref 13.0–17.0)
MCH: 27.9 pg (ref 26.0–34.0)
MCHC: 29.7 g/dL — AB (ref 30.0–36.0)
MCV: 94 fL (ref 78.0–100.0)
PLATELETS: 357 10*3/uL (ref 150–400)
RBC: 3.48 MIL/uL — ABNORMAL LOW (ref 4.22–5.81)
RDW: 16 % — AB (ref 11.5–15.5)
WBC: 8.8 10*3/uL (ref 4.0–10.5)

## 2017-01-02 LAB — GLUCOSE, CAPILLARY
GLUCOSE-CAPILLARY: 128 mg/dL — AB (ref 65–99)
GLUCOSE-CAPILLARY: 107 mg/dL — AB (ref 65–99)
GLUCOSE-CAPILLARY: 146 mg/dL — AB (ref 65–99)
GLUCOSE-CAPILLARY: 129 mg/dL — AB (ref 65–99)
Glucose-Capillary: 119 mg/dL — ABNORMAL HIGH (ref 65–99)

## 2017-01-02 MED ORDER — NALOXONE HCL 0.4 MG/ML IJ SOLN
0.4000 mg | Freq: Once | INTRAMUSCULAR | Status: AC
  Administered 2017-01-02: 0.4 mg via INTRAVENOUS

## 2017-01-02 MED ORDER — VALPROATE SODIUM 500 MG/5ML IV SOLN
2500.0000 mg | Freq: Once | INTRAVENOUS | Status: AC
  Administered 2017-01-02: 2500 mg via INTRAVENOUS
  Filled 2017-01-02: qty 25

## 2017-01-02 MED ORDER — VALPROATE SODIUM 500 MG/5ML IV SOLN
700.0000 mg | Freq: Three times a day (TID) | INTRAVENOUS | Status: DC
  Administered 2017-01-02 – 2017-01-04 (×5): 700 mg via INTRAVENOUS
  Filled 2017-01-02 (×6): qty 7

## 2017-01-02 MED ORDER — NALOXONE HCL 0.4 MG/ML IJ SOLN
INTRAMUSCULAR | Status: AC
  Administered 2017-01-02: 14:00:00
  Filled 2017-01-02: qty 1

## 2017-01-02 MED ORDER — LORAZEPAM 2 MG/ML IJ SOLN: 1.0000 mg | INTRAMUSCULAR | Status: DC | PRN

## 2017-01-02 MED ORDER — BISACODYL 5 MG PO TBEC
10.0000 mg | DELAYED_RELEASE_TABLET | Freq: Every day | ORAL | Status: DC | PRN
  Administered 2017-01-02: 10 mg via ORAL
  Filled 2017-01-02: qty 2

## 2017-01-02 MED ORDER — DOCUSATE SODIUM 100 MG PO CAPS
200.0000 mg | ORAL_CAPSULE | Freq: Two times a day (BID) | ORAL | Status: DC
  Administered 2017-01-02: 200 mg via ORAL
  Administered 2017-01-02: 100 mg via ORAL
  Administered 2017-01-03 – 2017-01-05 (×4): 200 mg via ORAL
  Filled 2017-01-02 (×6): qty 2

## 2017-01-02 NOTE — Significant Event (Signed)
Rapid Response Event Note  Overview: Time Called: 3383 Arrival Time: 1400 Event Type: Neurologic  Initial Focused Assessment: Prior to my arrival patient difficult to arouse, narcan given and RN witnessed seizure activity.    Upon my arrival patient sitting in the chair alert but confused. He had some difficulty standing but then was able to stand and pivot to the bed.   Lung sounds clear, occ irregular heart rate  BP 114/87  HR 80s  RR 20  O2 sat 95% on RA   Interventions:  Plan: transfer patient to SDU, 3M10  Plan of Care (if not transferred): RN to call if assistance needed prior to transfer  Event Summary: Name of Physician Notified: Dr Ree Kida prior to my arrival at      at    Outcome: Transferred (Comment)  Event End Time: [place]  Raliegh Ip

## 2017-01-02 NOTE — Anesthesia Postprocedure Evaluation (Signed)
Anesthesia Post Note  Patient: Jacob [name]  Procedure(s) Performed: Procedure(s) (LRB): TRANSMETATARSAL AMPUTATION (Right)     Patient location during evaluation: PACU Anesthesia Type: General Level of consciousness: awake and alert Pain management: pain level controlled Vital Signs Assessment: post-procedure vital signs reviewed and stable Respiratory status: spontaneous breathing, nonlabored ventilation, respiratory function stable and patient connected to nasal cannula oxygen Cardiovascular status: blood pressure returned to baseline and stable Postop Assessment: no signs of nausea or vomiting Anesthetic complications: no    Last Vitals:  Vitals:   01/01/17 2044 01/02/17 0424  BP: (!) 158/73 (!) 151/85  Pulse: 80 86  Resp: 18 18  Temp: 36.7 C 36.7 C    Last Pain:  Vitals:   01/02/17 0424  TempSrc: Oral  PainSc:                  Tekeyah Santiago P Nathanal Hermiz

## 2017-01-02 NOTE — Progress Notes (Addendum)
Received a called from RN, stating patient was altered. Thought possibly secondary to pain medications. One dose of narcan given with little affect. Patient was then found lying flat, "choking" on his saliva, and then began to shake. Activity lasted a couple of minutes. Ativan ordered.  Reviewing patient's labs, his sodium dropped 11 points from yesterday afternoon until this morning. He did dialyze as well. Have ordered STAT BMP, STAT CT head, EEG. Discussed case with Dr. Posey Pronto, nephrology, if sodium is truly low, patient may need to dialyze today. Discussed with Neurology, Dr. Cheral Marker, agreed with plan and recommended giving Valproate loading dose 20mg /kg, followed by scheduled dose 5mg /kg TID. Patient may also need MRI.  Will transfer patient to stepdown for closer monitoring.   Patient currently stable, alert and oriented, however, mildly confused as to what just occurred.  Sister updated at bedside.   Critical care time 32 minutes (reviewing labs, discussing with consults, placing orders, etc)  Cyris Maalouf D.O. [facility] Pager [phone]  If 7PM-7AM, please contact night-coverage [email or web address] Password [email or web address] 01/02/2017, 2:00 PM

## 2017-01-02 NOTE — Progress Notes (Addendum)
  [facility] Progress Note  Subjective  - POD #1  Throat is dry. Hasn't had BM.   Objective Vitals:   01/01/17 2044 01/02/17 0424  BP: (!) 158/73 (!) 151/85  Pulse: 80 86  Resp: 18 18  Temp: 98 F (36.7 C) 98.1 F (36.7 C)    Intake/Output Summary (Last 24 hours) at 01/02/17 0948 Last data filed at 01/02/17 0830  Gross per 24 hour  Intake             1930 ml  Output             2030 ml  Net             -100 ml    Right foot with VAC. Battery on prevena is not working.   Assessment/Planning: 66 y.o. male is s/p: transmetatarsal amputation 1 Day Post-Op   Has incisional VAC to right foot. Battery not working. Contacted [employer] rep regarding VAC. Will likely place him on hospital VAC until time for d/c and reconnect to home VAC. Keep VAC on for 7 days. Patient can take off VAC at home.  Ok to d/c from vascular standpoint. Patient to follow-up in 2 weeks.  D/c wound care orders and follow-up arranged.   Jacob [name] 01/02/2017 9:48 AM -- Agree with above  Jacob Hinds, MD [facility] [place]: [phone] Pager: [phone]  Laboratory CBC    Component Value Date/Time   WBC 8.8 01/02/2017 0253   HGB 9.7 (L) 01/02/2017 0253   HCT 32.7 (L) 01/02/2017 0253   PLT 357 01/02/2017 0253    BMET    Component Value Date/Time   NA 126 (L) 01/02/2017 0253   K 4.2 01/02/2017 0253   CL 91 (L) 01/02/2017 0253   CO2 25 01/02/2017 0253   GLUCOSE 132 (H) 01/02/2017 0253   BUN 17 01/02/2017 0253   CREATININE 5.29 (H) 01/02/2017 0253   CALCIUM 8.1 (L) 01/02/2017 0253   GFRNONAA 10 (L) 01/02/2017 0253   GFRAA 12 (L) 01/02/2017 0253    COAG Lab Results  Component Value Date   INR 1.30 12/30/2016   No results found for: PTT  Antibiotics Anti-infectives    Start     Dose/Rate Route Frequency Ordered Stop   01/01/17 1200  vancomycin (VANCOCIN) IVPB 1000 mg/200 mL premix     1,000 mg 200 mL/hr over 60 Minutes  Intravenous Every M-W-F (Hemodialysis) 12/31/16 1038     12/30/16 1200  vancomycin (VANCOCIN) 1,500 mg in sodium chloride 0.9 % 250 mL IVPB  Status:  Discontinued     1,500 mg 250 mL/hr over 60 Minutes Intravenous Every M-W-F (Hemodialysis) 12/28/16 2111 12/31/16 1038   12/28/16 2200  aztreonam (AZACTAM) 500 mg in dextrose 5 % 50 mL IVPB     500 mg 100 mL/hr over 30 Minutes Intravenous Every 12 hours 12/28/16 2111     12/28/16 2100  metroNIDAZOLE (FLAGYL) IVPB 500 mg     500 mg 100 mL/hr over 60 Minutes Intravenous Every 8 hours 12/28/16 2050     12/28/16 1815  ciprofloxacin (CIPRO) IVPB 400 mg  Status:  Discontinued     400 mg 200 mL/hr over 60 Minutes Intravenous Every 24 hours 12/28/16 1810 12/28/16 2050       Jacob Jock, PA-C [facility] Office: [phone] Pager: [phone] 01/02/2017 9:48 AM

## 2017-01-02 NOTE — Progress Notes (Signed)
Called for report x2. Transported patient to [facility] for bed side report.  Cyndia Bent RN

## 2017-01-02 NOTE — Progress Notes (Signed)
PROGRESS NOTE    Jacob [name]  KCL:[id number] DOB: Oct 17, 1950 DOA: 12/28/2016 PCP: Jacob Sites, MD   Chief Complaint  Patient presents with  . Nail Problem    Brief Narrative:  HPI on 12/28/2016 by Dr. Christia Reading [name] Jacob [name] is a 66 y.o. male with medical history significant for hypertension, insulin-dependent diabetes mellitus, chronic normocytic anemia, and end-stage renal disease on hemodialysis, now presenting to the emergency department for evaluation of right foot wound with purulent drainage and foul odor. Patient reports that he noted a small sore on the second right toe several months ago and states that it has slowly, but progressively worsened since that time. This was noted by someone at the dialysis center and he received vancomycin with HD for the past week. Despite this, there continues to be purulent discharge and foul odor from the foot and newer atrophy of the second toe as well as darkening of the forefoot. [name] denies fevers or chills, denies chest pain or palpitations, and denies any increase in his chronic dyspnea. He has not been attempting any interventions for these symptoms, and actually denies any pain from the foot.  Interim history S/p aortogram and transmetatarsal amputation (R). PT c/s. Discontinued antibiotics  Assessment & Plan   Diabetic foot ulcers with gangrene/cellulitis -Affecting the right foot first and second metatarsals -X-rays negative for osteomyelitis -Patient currently afebrile with no leukocytosis however has been on vancomycin for the past week with hemodialysis -ESR 102, CRP 15.6 -Vascular surgery consulted and appreciated, s/p aortogram with runoff, intervention (see complete list under procedures) and s/p metatarsal amputation 01/01/2017 -Was placed on aztreonam, vancomycin, Flagyl (patient has penicillin allergy)- will discontinue antibiotics as patient is now s/p transmetatarsal amputation  -Continue plavix -ABI done on 12/31/2016:  Right 2.4/1.54, left 1.04 -patient has wound vac in place, which will need to continue for 7 days.  -PT consulted -Patient will need to follow up with vascular in 2 weeks  End-stage renal disease on hemodialysis -Patient dialyzes Monday, Wednesday, Friday -Nephrology consultation appreciated -Discussed with Dr. Posey Pronto, patient has been using his fistula, HD catheter may be removed -HD catheter removed on 12/30/2016  Diabetes mellitus, type II -Continue Lantus, insulin sliding scale CBG monitoring  Essential hypertension -Continue Coreg, ramipril  Anemia secondary to chronic disease -Hemoglobin currently 9.7, continue to monitor CBC  Constipation -Patient has miralax and dulcolax ordered PRN. Will make these scheduled and on colace. -Suspect secondary to pain medications and recent immobility   DVT Prophylaxis  SCDs  Code Status: Full  Family Communication: None at bedside  Disposition Plan: Admitted. Pending PT eval. Possibly discharge in 24-48hr  Consultants Vascular surgery Nephrology  Procedures  US guided cannulation of left common femoral artery;  Aortogram with right lower extremity runoff; Drug coated balloon angioplasty of right popliteal artery with 76m impact admiral; Atherectomy with 1.25 micro csi and pta with 339mballoon of right pt artery; Atherectomy with 1.25 micro csi and pta with 40m78malloon of right at artery and 2mm79mlloon of right dp artery; Moderate sedation with fentnyl and versed for 121 minutes; Removal of right IJ tunneled dialysis catheter  ABI  Antibiotics   Anti-infectives    Start     Dose/Rate Route Frequency Ordered Stop   01/01/17 1200  vancomycin (VANCOCIN) IVPB 1000 mg/200 mL premix     1,000 mg 200 mL/hr over 60 Minutes Intravenous Every M-W-F (Hemodialysis) 12/31/16 1038     12/30/16 1200  vancomycin (VANCOCIN) 1,500 mg in sodium  chloride 0.9 % 250 mL IVPB  Status:  Discontinued     1,500 mg 250 mL/hr over 60 Minutes Intravenous  Every M-W-F (Hemodialysis) 12/28/16 2111 12/31/16 1038   12/28/16 2200  aztreonam (AZACTAM) 500 mg in dextrose 5 % 50 mL IVPB     500 mg 100 mL/hr over 30 Minutes Intravenous Every 12 hours 12/28/16 2111     12/28/16 2100  metroNIDAZOLE (FLAGYL) IVPB 500 mg     500 mg 100 mL/hr over 60 Minutes Intravenous Every 8 hours 12/28/16 2050     12/28/16 1815  ciprofloxacin (CIPRO) IVPB 400 mg  Status:  Discontinued     400 mg 200 mL/hr over 60 Minutes Intravenous Every 24 hours 12/28/16 1810 12/28/16 2050      Subjective:   Jacob [name] seen and examined today. Patient has no complaints today. Still has not had a bowel movement. Denies chest pain, shortness of breath, abdominal pain.   Objective:   Vitals:   01/01/17 1800 01/01/17 1823 01/01/17 2044 01/02/17 0424  BP:  128/60 (!) 158/73 (!) 151/85  Pulse: 73 78 80 86  Resp: '17  18 18  ' Temp:   98 F (36.7 C) 98.1 F (36.7 C)  TempSrc:   Oral Oral  SpO2: 100% 97% 98% 100%  Weight:    128.8 kg (283 lb 15.2 oz)  Height:        Intake/Output Summary (Last 24 hours) at 01/02/17 1207 Last data filed at 01/02/17 0830  Gross per 24 hour  Intake             1930 ml  Output               30 ml  Net             1900 ml   Filed Weights   01/01/17 0728 01/01/17 1200 01/02/17 0424  Weight: 135.9 kg (299 lb 9.7 oz) 133.9 kg (295 lb 3.1 oz) 128.8 kg (283 lb 15.2 oz)   Exam  General: Well developed, well nourished, NAD, appears stated age  HEENT: NCAT, mucous membranes moist.   Cardiovascular: S1 S2 auscultated, no rubs, murmurs or gallops. Regular rate and rhythm.  Respiratory: Clear to auscultation bilaterally with equal chest rise  Abdomen: Soft, obese, nontender, nondistended, + bowel sounds  Extremities: warm dry without cyanosis clubbing. Right transmetatarsal amputation with vac in place.   Neuro: AAOx3, nonfocal  Psych: Appropriate   Data Reviewed: I have personally reviewed following labs and imaging  studies  CBC:  Recent Labs Lab 12/28/16 1836 12/29/16 4128 12/30/16 0226 12/30/16 2041 12/31/16 0220 01/01/17 0352 01/01/17 1343 01/02/17 0253  WBC 8.1 8.2 9.0 7.9 7.6 8.7  --  8.8  NEUTROABS 5.6 5.2  --   --   --   --   --   --   HGB 10.4* 10.2* 10.0* 9.6* 9.6* 9.4* 11.9* 9.7*  HCT 33.7* 33.9* 32.9* 31.1* 31.6* 30.8* 35.0* 32.7*  MCV 93.4 94.2 92.9 91.2 92.4 92.2  --  94.0  PLT 335 331 325 370 363 357  --  786   Basic Metabolic Panel:  Recent Labs Lab 12/29/16 0624 12/30/16 0226 12/30/16 2040 12/31/16 0220 01/01/17 0352 01/01/17 1343 01/02/17 0253  NA 132* 130* 130* 130* 130* 135 126*  K 3.2* 3.6 3.6 3.5 3.9 3.9 4.2  CL 92* 92* 94* 93* 94*  --  91*  CO2 '29 27 24 27 26  ' --  25  GLUCOSE 190* 169* 138* 213*  151* 100* 132*  BUN 25* 30* 34* 15 28*  --  17  CREATININE 6.48* 7.84* 8.97* 5.32* 7.16*  --  5.29*  CALCIUM 8.2* 8.2* 8.0* 7.8* 8.1*  --  8.1*  MG 1.9  --   --   --   --   --   --   PHOS  --   --  3.8  --   --   --   --    GFR: Estimated Creatinine Clearance: 19.3 mL/min (A) (by C-G formula based on SCr of 5.29 mg/dL (H)). Liver Function Tests:  Recent Labs Lab 12/28/16 1836 12/30/16 2040  AST 18  --   ALT 5*  --   ALKPHOS 141*  --   BILITOT 0.4  --   PROT 9.0*  --   ALBUMIN 2.9* 2.4*   No results for input(s): LIPASE, AMYLASE in the last 168 hours. No results for input(s): AMMONIA in the last 168 hours. Coagulation Profile:  Recent Labs Lab 12/30/16 0226  INR 1.30   Cardiac Enzymes: No results for input(s): CKTOTAL, CKMB, CKMBINDEX, TROPONINI in the last 168 hours. BNP (last 3 results) No results for input(s): PROBNP in the last 8760 hours. HbA1C: No results for input(s): HGBA1C in the last 72 hours. CBG:  Recent Labs Lab 01/01/17 0600 01/01/17 1714 01/01/17 2025 01/02/17 0646 01/02/17 1121  GLUCAP 154* 98 135* 128* 107*   Lipid Profile: No results for input(s): CHOL, HDL, LDLCALC, TRIG, CHOLHDL, LDLDIRECT in the last 72  hours. Thyroid Function Tests: No results for input(s): TSH, T4TOTAL, FREET4, T3FREE, THYROIDAB in the last 72 hours. Anemia Panel: No results for input(s): VITAMINB12, FOLATE, FERRITIN, TIBC, IRON, RETICCTPCT in the last 72 hours. Urine analysis: No results found for: COLORURINE, APPEARANCEUR, LABSPEC, [place], GLUCOSEU, HGBUR, BILIRUBINUR, KETONESUR, PROTEINUR, UROBILINOGEN, NITRITE, LEUKOCYTESUR Sepsis Labs: '@LABRCNTIP' (procalcitonin:4,lacticidven:4)  ) Recent Results (from the past 240 hour(s))  MRSA PCR Screening     Status: Abnormal   Collection Time: 12/29/16 12:54 AM  Result Value Ref Range Status   MRSA by PCR POSITIVE (A) NEGATIVE Final    Comment:        The GeneXpert MRSA Assay (FDA approved for NASAL specimens only), is one component of a comprehensive MRSA colonization surveillance program. It is not intended to diagnose MRSA infection nor to guide or monitor treatment for MRSA infections. RESULT CALLED TO, READ BACK BY AND VERIFIED WITH: BULLINS,M @ [place]       Radiology Studies: No results found.   Scheduled Meds: . carvedilol  6.25 mg Oral BID WC  . cinacalcet  30 mg Oral Q breakfast  . clopidogrel  75 mg Oral Q breakfast  . docusate sodium  200 mg Oral BID  . insulin aspart  0-15 Units Subcutaneous TID WC  . insulin aspart  0-5 Units Subcutaneous QHS  . insulin glargine  10 Units Subcutaneous Daily  . multivitamin  1 tablet Oral Q M,W,F  . mupirocin ointment  1 application Nasal BID  . pantoprazole  40 mg Oral Daily  . ramipril  10 mg Oral Daily  . sevelamer carbonate  2,400 mg Oral TID WC   Continuous Infusions: . aztreonam Stopped (01/02/17 1003)  . metronidazole 500 mg (01/02/17 1130)  . vancomycin 1 g (01/01/17 1217)     LOS: 5 days   Time Spent in minutes   30 minutes  Kaylei Frink D.O. on 01/02/2017 at 12:07 PM  Between 7am to 7pm - Pager - [phone]  After  7pm go to [email or web address] - password [email or web address]  And look for  the night coverage person covering for me after hours  [facility] Office  [phone]

## 2017-01-02 NOTE — Progress Notes (Signed)
Received bedside report from 2W RN, Ellwood Dense. This RN was not contacted re: hand off report for patient until patient was inroute to SDU. Patient was received and admitted to unit with no complications.

## 2017-01-02 NOTE — Progress Notes (Signed)
Patient ID: Jacob [name], male   DOB: July 11, 1951, 66 y.o.   MRN: [id number]  [facility] Progress Note   Assessment/ Plan:   1. Diabetic right foot ulcer with gangrene/cellulitis-Peripheral vascular disease: On broad-spectrum antibiotic coverage with vancomycin at dialysis. S/p TMA yesterday after angiogram with angioplasty and atherectomy earlier this week. With wound vac in place 2. End-stage renal disease: On a Monday/Wednesday/Friday dialysis schedule and underwent hemodialysis yesterday. Without acute indications for hemodialysis today--will continue to monitor and plan for next HD Monday. 3. Hypertension: Blood pressures elevated overnight likely from pain--will monitor for need to adjust medications. 4. Anemia of chronic kidney disease: On ESA, Hgb lower post-op, no indications for PRBCs. 5. Metabolic bone disease: Restarted back on Sensipar for PTH suppression, on sevelamer for phosphorus binding. Subjective:   Reports some discomfort over right TMA site overnight as expected. Denies any CP or SOB    Objective:   BP (!) 151/85 (BP Location: Left Arm)   Pulse 86   Temp 98.1 F (36.7 C) (Oral)   Resp 18   Ht 6\' 2"  (1.88 m)   Wt 128.8 kg (283 lb 15.2 oz)   SpO2 100%   BMI 36.46 kg/m   Physical Exam: [id number] resting in bed, eating breakfast CVS: Pulse regular rhythm, normal rate, S1 and S2 normal Resp: Clear to auscultation bilaterally, no rales/rhonchi Abd: Soft, obese, nontender Ext: s/p right TMA with wound vac in place.  Labs: BMET  Recent Labs Lab 12/28/16 1836 12/29/16 0093 12/30/16 0226 12/30/16 2040 12/31/16 0220 01/01/17 0352 01/01/17 1343 01/02/17 0253  NA 132* 132* 130* 130* 130* 130* 135 126*  K 3.3* 3.2* 3.6 3.6 3.5 3.9 3.9 4.2  CL 93* 92* 92* 94* 93* 94*  --  91*  CO2 28 29 27 24 27 26   --  25  GLUCOSE 146* 190* 169* 138* 213* 151* 100* 132*  BUN 22* 25* 30* 34* 15 28*  --  17  CREATININE 5.87* 6.48* 7.84* 8.97* 5.32* 7.16*   --  5.29*  CALCIUM 8.5* 8.2* 8.2* 8.0* 7.8* 8.1*  --  8.1*  PHOS  --   --   --  3.8  --   --   --   --    CBC  Recent Labs Lab 12/28/16 1836 12/29/16 0624  12/30/16 2041 12/31/16 0220 01/01/17 0352 01/01/17 1343 01/02/17 0253  WBC 8.1 8.2  < > 7.9 7.6 8.7  --  8.8  NEUTROABS 5.6 5.2  --   --   --   --   --   --   HGB 10.4* 10.2*  < > 9.6* 9.6* 9.4* 11.9* 9.7*  HCT 33.7* 33.9*  < > 31.1* 31.6* 30.8* 35.0* 32.7*  MCV 93.4 94.2  < > 91.2 92.4 92.2  --  94.0  PLT 335 331  < > 370 363 357  --  357  < > = values in this interval not displayed. Medications:    . carvedilol  6.25 mg Oral BID WC  . cinacalcet  30 mg Oral Q breakfast  . clopidogrel  75 mg Oral Q breakfast  . collagenase   Topical Daily  . docusate sodium  100 mg Oral BID  . insulin aspart  0-15 Units Subcutaneous TID WC  . insulin aspart  0-5 Units Subcutaneous QHS  . insulin glargine  10 Units Subcutaneous Daily  . multivitamin  1 tablet Oral Q M,W,F  . mupirocin ointment  1 application Nasal BID  .  pantoprazole  40 mg Oral Daily  . ramipril  10 mg Oral Daily  . sevelamer carbonate  2,400 mg Oral TID WC   Elmarie Shiley, MD 01/02/2017, 9:00 AM

## 2017-01-02 NOTE — Progress Notes (Signed)
Patient was asleep in bed, saliva coming out of the mouth, sounded to be choking on saliva, unable to wake with sternal rub. Carly, RN assisted with sitting the patient up. He woke up but lethargic and confused, vitals were stable, glucose stable. Contacted MD, MikHail stated to give Narcan, patient responded only slightly to Narcan. Then patient began convulsing and vomited. Rapid response contacted. MD orders to transfer to step down.  Cyndia Bent RN

## 2017-01-02 NOTE — Progress Notes (Signed)
Patient wound care orders are such that they are unable to be carried out per shift. Dressing are not to be removed until 01/08/17

## 2017-01-02 NOTE — Progress Notes (Signed)
PT Cancellation Note  Patient Details Name: Jacob [name] MRN: [id number] DOB: Jan 26, 1951   Cancelled Treatment:    Reason Eval/Treat Not Completed: Medical issues which prohibited therapy.  Attempted to see patient.  RN's in with patient due to decline.  On return, noted MD note of status change and transfer to SDU.  Will return at later date for PT evaluation when appropriate for patient.   Despina Pole 01/02/2017, 2:53 PM Carita Pian. Sanjuana Kava, [place] Pager [phone]

## 2017-01-03 ENCOUNTER — Encounter (HOSPITAL_COMMUNITY): Payer: Self-pay | Admitting: Surgery

## 2017-01-03 LAB — BASIC METABOLIC PANEL
ANION GAP: 11 (ref 5–15)
BUN: 28 mg/dL — ABNORMAL HIGH (ref 6–20)
CO2: 26 mmol/L (ref 22–32)
CREATININE: 7.06 mg/dL — AB (ref 0.61–1.24)
Calcium: 8.2 mg/dL — ABNORMAL LOW (ref 8.9–10.3)
Chloride: 91 mmol/L — ABNORMAL LOW (ref 101–111)
GFR calc non Af Amer: 7 mL/min — ABNORMAL LOW (ref >60)
GFR, EST AFRICAN AMERICAN: 8 mL/min — AB (ref >60)
Glucose, Bld: 106 mg/dL — ABNORMAL HIGH (ref 65–99)
Potassium: 4.7 mmol/L (ref 3.5–5.1)
SODIUM: 128 mmol/L — AB (ref 135–145)

## 2017-01-03 LAB — CBC
HCT: 30.3 % — ABNORMAL LOW (ref 39.0–52.0)
HEMOGLOBIN: 9.2 g/dL — AB (ref 13.0–17.0)
MCH: 28.2 pg (ref 26.0–34.0)
MCHC: 30.4 g/dL (ref 30.0–36.0)
MCV: 92.9 fL (ref 78.0–100.0)
Platelets: 379 10*3/uL (ref 150–400)
RBC: 3.26 MIL/uL — AB (ref 4.22–5.81)
RDW: 16.1 % — ABNORMAL HIGH (ref 11.5–15.5)
WBC: 9.9 10*3/uL (ref 4.0–10.5)

## 2017-01-03 LAB — GLUCOSE, CAPILLARY
GLUCOSE-CAPILLARY: 94 mg/dL (ref 65–99)
GLUCOSE-CAPILLARY: 138 mg/dL — AB (ref 65–99)
GLUCOSE-CAPILLARY: 136 mg/dL — AB (ref 65–99)
Glucose-Capillary: 118 mg/dL — ABNORMAL HIGH (ref 65–99)

## 2017-01-03 MED ORDER — WHITE PETROLATUM GEL
Status: AC
  Administered 2017-01-03: 06:00:00
  Filled 2017-01-03: qty 1

## 2017-01-03 MED ORDER — BISACODYL 10 MG RE SUPP
10.0000 mg | Freq: Once | RECTAL | Status: AC
  Administered 2017-01-03: 10 mg via RECTAL
  Filled 2017-01-03: qty 1

## 2017-01-03 NOTE — Progress Notes (Signed)
PROGRESS NOTE    Jacob [name]  TIW:[id number] DOB: 01/20/1951 DOA: 12/28/2016 PCP: Sharilyn Sites, MD   Chief Complaint  Patient presents with  . Nail Problem    Brief Narrative:  HPI on 12/28/2016 by Dr. Christia Reading Opyd Jacob [name] is a 66 y.o. male with medical history significant for hypertension, insulin-dependent diabetes mellitus, chronic normocytic anemia, and end-stage renal disease on hemodialysis, now presenting to the emergency department for evaluation of right foot wound with purulent drainage and foul odor. Patient reports that he noted a small sore on the second right toe several months ago and states that it has slowly, but progressively worsened since that time. This was noted by someone at the dialysis center and he received vancomycin with HD for the past week. Despite this, there continues to be purulent discharge and foul odor from the foot and newer atrophy of the second toe as well as darkening of the forefoot. [name] denies fevers or chills, denies chest pain or palpitations, and denies any increase in his chronic dyspnea. He has not been attempting any interventions for these symptoms, and actually denies any pain from the foot.  Interim history S/p aortogram and transmetatarsal amputation (R). PT c/s. Discontinued antibiotics. Patient had seizure on 01/02/2017 possibly due to hyponatremia.  Assessment & Plan   Acute seizure -On 01/02/2017,patient witnessed having a seizure -Suspect secondary to hyponatremia. Patient dialyzed on 01/02/17, sodium noted to be 135 in the afternoon, however dropped to 126 on the morning on 6/3. Patient's sodium at baseline normally around 130.  -Patient was moved to stepdown for closer monitoring -Loaded with valproate (61m/kg), with continued dose of 541mkg TID -Currently AAOx3. However does not remember the events that occurred on 6/3. -CT head obtained and was unremarkable -Discussed with Dr. LiCheral Markerneurology, no further recommendations,  agrees with the above management -Sodium currently 128  Diabetic foot ulcers with gangrene/cellulitis -Affecting the right foot first and second metatarsals -X-rays negative for osteomyelitis -Patient currently afebrile with no leukocytosis however has been on vancomycin for the past week with hemodialysis -ESR 102, CRP 15.6 -Vascular surgery consulted and appreciated, s/p aortogram with runoff, intervention (see complete list under procedures) and s/p metatarsal amputation 01/01/2017 -Was placed on aztreonam, vancomycin, Flagyl (patient has penicillin allergy)- will discontinue antibiotics as patient is now s/p transmetatarsal amputation  -Continue plavix -ABI done on 12/31/2016: Right 2.4/1.54, left 1.04 -patient has wound vac in place until 01/08/2017 -PT consulted and pending  -Patient will need to follow up with vascular in 2 weeks  End-stage renal disease on hemodialysis -Patient dialyzes Monday, Wednesday, Friday -Nephrology consultation appreciated -Discussed with Dr. PaPosey Prontopatient has been using his fistula, HD catheter may be removed -HD catheter removed on 12/30/2016  Diabetes mellitus, type II -Continue Lantus, insulin sliding scale CBG monitoring  Essential hypertension -Continue Coreg, ramipril  Anemia secondary to chronic disease -Hemoglobin currently 9.2, continue to monitor CBC  Constipation -Patient has miralax and dulcolax ordered PRN.  -Suspect secondary to pain medications and recent immobility  -Will order dulcolax suppository  Hyponatremia -Chronic -Currently 128 -Will continue to monitor BMP -Obtain TSH (however, given acute illness, may not be beneficial)  DVT Prophylaxis  SCDs  Code Status: Full  Family Communication: None at bedside  Disposition Plan: Admitted. Pending PT eval. Possibly discharge in 24-48hr  Consultants Vascular surgery Nephrology  Procedures  [place] cannulation of left common femoral artery;  Aortogram with right lower  extremity runoff; Drug coated balloon angioplasty of right popliteal  artery with 44m impact admiral; Atherectomy with 1.25 micro csi and pta with 334mballoon of right pt artery; Atherectomy with 1.25 micro csi and pta with 84m52malloon of right at artery and 2mm11mlloon of right dp artery; Moderate sedation with fentnyl and versed for 121 minutes; Removal of right IJ tunneled dialysis catheter  ABI  Antibiotics   Anti-infectives    Start     Dose/Rate Route Frequency Ordered Stop   01/01/17 1200  vancomycin (VANCOCIN) IVPB 1000 mg/200 mL premix  Status:  Discontinued     1,000 mg 200 mL/hr over 60 Minutes Intravenous Every M-W-F (Hemodialysis) 12/31/16 1038 01/02/17 1210   12/30/16 1200  vancomycin (VANCOCIN) 1,500 mg in sodium chloride 0.9 % 250 mL IVPB  Status:  Discontinued     1,500 mg 250 mL/hr over 60 Minutes Intravenous Every M-W-F (Hemodialysis) 12/28/16 2111 12/31/16 1038   12/28/16 2200  aztreonam (AZACTAM) 500 mg in dextrose 5 % 50 mL IVPB  Status:  Discontinued     500 mg 100 mL/hr over 30 Minutes Intravenous Every 12 hours 12/28/16 2111 01/02/17 1210   12/28/16 2100  metroNIDAZOLE (FLAGYL) IVPB 500 mg  Status:  Discontinued     500 mg 100 mL/hr over 60 Minutes Intravenous Every 8 hours 12/28/16 2050 01/02/17 1210   12/28/16 1815  ciprofloxacin (CIPRO) IVPB 400 mg  Status:  Discontinued     400 mg 200 mL/hr over 60 Minutes Intravenous Every 24 hours 12/28/16 1810 12/28/16 2050      Subjective:   Jacob [name] and examined today. Patient cannot recall the events that occurred yesterday. Denies chest pain, shortness of breath, abdominal pain. Complains of constipation, states last BM was days ago.   Objective:   Vitals:   01/02/17 2028 01/02/17 2356 01/03/17 0420 01/03/17 0822  BP: (!) 123/59 (!) 151/65 (!) 167/83 (!) 156/77  Pulse: 71 75 73 68  Resp: 14 (!) _0 Temp: 98.2 F (36.8 C) 97.9 F (36.6 C) 97.7 F (36.5 C) 98 F (36.7 C)  TempSrc: Oral Oral Oral  Oral  SpO2: 100% 93% 100% 100%  Weight:      Height:        Intake/Output Summary (Last 24 hours) at 01/03/17 1031 Last data filed at 01/03/17 [id number] per 24 hour  Intake              439 ml  Output               50 ml  Net              389 ml   Filed Weights   01/01/17 1200 01/02/17 0424 01/02/17 1521  Weight: 133.9 kg (295 lb 3.1 oz) 128.8 kg (283 lb 15.2 oz) (!) 137.7 kg (303 lb 9.2 oz)   Exam  General: Well developed, well nourished, NAD, appears stated age  HEENT: NCAT,mucous membranes moist.   Cardiovascular: S1 S2 auscultated, RRR, no murmurs  Respiratory: Clear to auscultation bilaterally with equal chest rise  Abdomen: Soft, nontender, nondistended, + bowel sounds  Extremities: warm dry without cyanosis clubbing. Right transmetatarsal amputation, with vac in place.  Neuro: AAOx3, nonfocal (falls asleep easily)  Psych: Appropriate mood and affect  Data Reviewed: I have personally reviewed following labs and imaging studies  CBC:  Recent Labs Lab 12/28/16 1836 12/29/16 [id number]/30/18 2041 12/31/16 0220 01/01/17 0352 01/01/17 1343 01/02/17 0253 01/03/17 0413  WBC 8.1 8.2  < > 7.9 7.6  8.7  --  8.8 9.9  NEUTROABS 5.6 5.2  --   --   --   --   --   --   --   HGB 10.4* 10.2*  < > 9.6* 9.6* 9.4* 11.9* 9.7* 9.2*  HCT 33.7* 33.9*  < > 31.1* 31.6* 30.8* 35.0* 32.7* 30.3*  MCV 93.4 94.2  < > 91.2 92.4 92.2  --  94.0 92.9  PLT 335 331  < > 370 363 357  --  357 379  < > = values in this interval not displayed. Basic Metabolic Panel:  Recent Labs Lab 12/29/16 0624  12/30/16 2040 12/31/16 0220 01/01/17 0352 01/01/17 1343 01/02/17 0253 01/02/17 1354 01/03/17 0413  NA 132*  < > 130* 130* 130* 135 126* 129* 128*  K 3.2*  < > 3.6 3.5 3.9 3.9 4.2 4.7 4.7  CL 92*  < > 94* 93* 94*  --  91* 94* 91*  CO2 29  < > _0 --  _1 GLUCOSE 190*  < > 138* 213* 151* 100* 132* 120* 106*  BUN 25*  < > 34* 15 28*  --  17 22* 28*  CREATININE 6.48*  < > 8.97*  5.32* 7.16*  --  5.29* 6.07* 7.06*  CALCIUM 8.2*  < > 8.0* 7.8* 8.1*  --  8.1* 8.3* 8.2*  MG 1.9  --   --   --   --   --   --   --   --   PHOS  --   --  3.8  --   --   --   --   --   --   < > = values in this interval not displayed. GFR: Estimated Creatinine Clearance: 15 mL/min (A) (by C-G formula based on SCr of 7.06 mg/dL (H)). Liver Function Tests:  Recent Labs Lab 12/28/16 1836 12/30/16 2040  AST 18  --   ALT 5*  --   ALKPHOS 141*  --   BILITOT 0.4  --   PROT 9.0*  --   ALBUMIN 2.9* 2.4*   No results for input(s): LIPASE, AMYLASE in the last 168 hours. No results for input(s): AMMONIA in the last 168 hours. Coagulation Profile:  Recent Labs Lab 12/30/16 0226  INR 1.30   Cardiac Enzymes: No results for input(s): CKTOTAL, CKMB, CKMBINDEX, TROPONINI in the last 168 hours. BNP (last 3 results) No results for input(s): PROBNP in the last 8760 hours. HbA1C: No results for input(s): HGBA1C in the last 72 hours. CBG:  Recent Labs Lab 01/02/17 1121 01/02/17 1327 01/02/17 1710 01/02/17 2127 01/03/17 0824  GLUCAP 107* 146* 129* 119* 94   Lipid Profile: No results for input(s): CHOL, HDL, LDLCALC, TRIG, CHOLHDL, LDLDIRECT in the last 72 hours. Thyroid Function Tests: No results for input(s): TSH, T4TOTAL, FREET4, T3FREE, THYROIDAB in the last 72 hours. Anemia Panel: No results for input(s): VITAMINB12, FOLATE, FERRITIN, TIBC, IRON, RETICCTPCT in the last 72 hours. Urine analysis: No results found for: COLORURINE, APPEARANCEUR, LABSPEC, PHURINE, GLUCOSEU, HGBUR, BILIRUBINUR, KETONESUR, PROTEINUR, UROBILINOGEN, NITRITE, LEUKOCYTESUR Sepsis Labs: _2 (procalcitonin:4,lacticidven:4)  ) Recent Results (from the past 240 hour(s))  MRSA PCR Screening     Status: Abnormal   Collection Time: 12/29/16 12:54 AM  Result Value Ref Range Status   MRSA by PCR POSITIVE (A) NEGATIVE Final    Comment:        The GeneXpert MRSA Assay (FDA approved for NASAL  specimens only), is one component  of a comprehensive MRSA colonization surveillance program. It is not intended to diagnose MRSA infection nor to guide or monitor treatment for MRSA infections. RESULT CALLED TO, READ BACK BY AND VERIFIED WITH: BULLINS,M @ 833 BY MATTHEWS,B       Radiology Studies: Ct Head Wo Contrast  Result Date: 01/02/2017 CLINICAL DATA:  Patient with syncopal episode. EXAM: CT HEAD WITHOUT CONTRAST TECHNIQUE: Contiguous axial images were obtained from the base of the skull through the vertex without intravenous contrast. COMPARISON:  Brain CT 04/05/2016 FINDINGS: Brain: Ventricles and sulci are appropriate for patient's age. No evidence for acute cortically based infarct, intracranial hemorrhage, mass lesion or mass-effect. Periventricular and subcortical white matter hypodensity compatible with chronic microvascular ischemic changes. Vascular: Internal carotid arterial vascular calcifications. Skull:  Intact. Sinuses/Orbits: Minimal fluid within the mastoid air cells bilaterally. Paranasal sinuses are well aerated. Orbits are unremarkable. Other: None. IMPRESSION: No acute intracranial process. Atrophy and chronic microvascular ischemic changes. Electronically Signed   By: Lovey Newcomer M.D.   On: 01/02/2017 17:41     Scheduled Meds: . carvedilol  6.25 mg Oral BID WC  . cinacalcet  30 mg Oral Q breakfast  . clopidogrel  75 mg Oral Q breakfast  . docusate sodium  200 mg Oral BID  . insulin aspart  0-15 Units Subcutaneous TID WC  . insulin aspart  0-5 Units Subcutaneous QHS  . insulin glargine  10 Units Subcutaneous Daily  . multivitamin  1 tablet Oral Q M,W,F  . pantoprazole  40 mg Oral Daily  . ramipril  10 mg Oral Daily  . sevelamer carbonate  2,400 mg Oral TID WC   Continuous Infusions: . valproate sodium Stopped (01/03/17 0635)     LOS: 6 days   Time Spent in minutes   30 minutes  Anber Mckiver D.O. on 01/03/2017 at 10:31 AM  Between 7am to 7pm -  Pager - [phone]  After 7pm go to [email or web address] - password [email or web address]  And look for the night coverage person covering for me after hours  [facility] Office  [phone]

## 2017-01-03 NOTE — Evaluation (Signed)
Physical Therapy Evaluation Patient Details Name: Jacob [name] MRN: [id number] DOB: 12/19/1950 Today's Date: 01/03/2017   History of Present Illness  Patient is a 66 yo male admitted 12/28/16 with gangrene of Rt foot, now s/p Rt transmetatarsal amputation.  Patient with seizure activity 01/02/17 and transferred to SDU.      PMH:  HTN, DM, anemia, ESRD on HD, anxiety, depression, CVA, NICM, arthritis  Clinical Impression  Patient presents with problems listed below.  Will benefit from acute PT to maximize functional mobility prior to discharge.  Today's session limited by pain and fatigue.  Required mod-max assist for mobility.  Patient was independent pta.  Patient will need to function at Mod I level to return home safely.  Recommend f/u HHPT for continued therapy at d/c. If patient unable to reach Mod I level, may need to consider d/c to SNF to allow safe return home following rehab.     Follow Up Recommendations Home health PT;Supervision - Intermittent    Equipment Recommendations  3in1 (PT)    Recommendations for Other Services       Precautions / Restrictions Precautions Precautions: Fall Precaution Comments: VAC on Rt foot at operative site Restrictions Weight Bearing Restrictions: No (No orders)      Mobility  Bed Mobility Overal bed mobility: Needs Assistance Bed Mobility: Rolling;Sidelying to Sit;Sit to Sidelying Rolling: Min guard Sidelying to sit: Mod assist;+2 for safety/equipment     Sit to sidelying: Mod assist;+2 for physical assistance General bed mobility comments: Verbal cues for technique.  Assist to raise trunk to sitting position and scoot to EOB in sitting.  Once upright, patient with fair sitting balance.  Required assist to control trunk and bring LE's onto bed to return to sidelying > supine.  Transfers Overall transfer level: Needs assistance Equipment used: Rolling walker (2 wheeled) Transfers: Sit to/from Stand Sit to Stand: Max assist;+2 physical  assistance         General transfer comment: Verbal cues for hand placement.  Attempted to stand x2 with hands on bed to push up with +2 max assist.  Patient able to clear hips from bed only.  Had patient put hands on RW (steadying RW), and able to stand with +2 max assist.  Patient limiting weight bearing on Rt foot.  Assist to control descent onto bed.  Ambulation/Gait Ambulation/Gait assistance: Mod assist;+2 physical assistance Ambulation Distance (Feet): 2 Feet Assistive device: Rolling walker (2 wheeled) Gait Pattern/deviations: Step-to pattern     General Gait Details: Verbal cues for sequencing.  Patient able to take 3 sidesteps toward [facility], continuing to minimize weight on Rt foot.  Stairs            Wheelchair Mobility    Modified Rankin (Stroke Patients Only)       Balance Overall balance assessment: Needs assistance Sitting-balance support: No upper extremity supported;Feet supported Sitting balance-Leahy Scale: Fair     Standing balance support: Bilateral upper extremity supported Standing balance-Leahy Scale: Poor                               Pertinent Vitals/Pain Pain Assessment: 0-10 Pain Score: 5  Pain Location: Rt foot Pain Descriptors / Indicators: Constant;Sore Pain Intervention(s): Limited activity within patient's tolerance;Monitored during session;Repositioned    Home Living Family/patient expects to be discharged to:: Private residence Living Arrangements: Alone Available Help at Discharge: Family;Available PRN/intermittently (ex-wife and sister) Type of Home: Apartment Home Access: Level entry  Home Layout: One level Home Equipment: [place] - 4 wheels;Cane - single point      Prior Function Level of Independence: Independent with assistive device(s);Needs assistance   Gait / Transfers Assistance Needed: Uses cane inside home.  Uses rollator outside of home.  ADL's / Homemaking Assistance Needed: Takes sponge baths.  Ex-wife and sister assist with meals and errands  Comments: Was driving     Hand Dominance        Extremity/Trunk Assessment   Upper Extremity Assessment Upper Extremity Assessment: Generalized weakness    Lower Extremity Assessment Lower Extremity Assessment: Generalized weakness;RLE deficits/detail RLE Deficits / Details: s/p Rt foot transmet amputation.  Able to move RLE against gravity       Communication   Communication: No difficulties  Cognition Arousal/Alertness: Awake/alert Behavior During Therapy: WFL for tasks assessed/performed Overall Cognitive Status: Within Functional Limits for tasks assessed                                        General Comments      Exercises     Assessment/Plan    PT Assessment Patient needs continued PT services  PT Problem List Decreased strength;Decreased activity tolerance;Decreased balance;Decreased mobility;Decreased knowledge of use of DME;Decreased knowledge of precautions;Obesity;Pain;Decreased skin integrity       PT Treatment Interventions DME instruction;Gait training;Functional mobility training;Therapeutic activities;Therapeutic exercise;Balance training;Patient/family education    PT Goals (Current goals can be found in the Care Plan section)  Acute Rehab PT Goals Patient Stated Goal: To walk PT Goal Formulation: With patient Time For Goal Achievement: 01/10/17 Potential to Achieve Goals: Good    Frequency Min 3X/week   Barriers to discharge Decreased caregiver support PRN assist only    Co-evaluation               AM-PAC PT "6 Clicks" Daily Activity  Outcome Measure Difficulty turning over in bed (including adjusting bedclothes, sheets and blankets)?: None Difficulty moving from lying on back to sitting on the side of the bed? : Total Difficulty sitting down on and standing up from a chair with arms (e.g., wheelchair, bedside commode, etc,.)?: Total Help needed moving to and from  a bed to chair (including a wheelchair)?: A Lot Help needed walking in hospital room?: A Lot Help needed climbing 3-5 steps with a railing? : Total 6 Click Score: 11    End of Session Equipment Utilized During Treatment: Gait belt Activity Tolerance: Patient limited by fatigue;Patient limited by pain Patient left: in bed;with call bell/phone within reach;with family/visitor present   PT Visit Diagnosis: Unsteadiness on feet (R26.81);Muscle weakness (generalized) (M62.81);Difficulty in walking, not elsewhere classified (R26.2);Pain Pain - Right/Left: Right Pain - part of body: Ankle and joints of foot    Time: 9678-9381 PT Time Calculation (min) (ACUTE ONLY): 27 min   Charges:   PT Evaluation $PT Eval Moderate Complexity: 1 Procedure PT Treatments $Therapeutic Activity: 8-22 mins   PT G Codes:        Carita Pian. Sanjuana Kava, [facility] Acute Rehab Services Pager [place] 01/03/2017, 7:54 PM

## 2017-01-03 NOTE — Progress Notes (Signed)
Patient ID: Jacob [name], male   DOB: [birth year], 66 y.o.   MRN: [id number]  [facility] Progress Note   Assessment/ Plan:   1. Diabetic right foot ulcer with gangrene/cellulitis-Peripheral vascular disease: On broad-spectrum antibiotic coverage with vancomycin at dialysis. POD #2 s/p TMA after angiogram with angioplasty and atherectomy earlier. As noted for evaluation by physical therapy and attempts at ambulation to decide on further outpatient needs upon discharge. 2. End-stage renal disease: On a Monday/Wednesday/Friday dialysis schedule and will order for his next hemodialysis tomorrow. Without acute indications for hemodialysis today--will continue to monitor for emerging/acute needs. 3. Hypertension: Blood pressures intermittently elevated, continue to monitor for needs to adjust antihypertensive therapy. 4. Anemia of chronic kidney disease: On ESA, Hgb lower post-op, no indications for PRBCs. 5. Metabolic bone disease: Restarted back on Sensipar for PTH suppression, on sevelamer for phosphorus binding. Subjective:   Suffered a seizure episode yesterday afternoon and was transferred to the neuro ICU. He has no recollections of events from yesterday-likely because of his postictal and (earlier) sedated state.    Objective:   BP (!) 156/77 (BP Location: Left Arm)   Pulse 68   Temp 98 F (36.7 C) (Oral)   Resp 15   Ht 6\' 2"  (1.88 m)   Wt (!) 137.7 kg (303 lb 9.2 oz)   SpO2 100%   BMI 38.98 kg/m   Physical Exam: [id number] resting in bed, Watching television CVS: Pulse regular rhythm, normal rate, S1 and S2 normal Resp: Clear to auscultation bilaterally, no rales/rhonchi Abd: Soft, obese, nontender Ext: s/p right TMA with wound vac in place. Right brachiocephalic fistula  Labs: BMET  Recent Labs Lab 12/30/16 0226 12/30/16 2040 12/31/16 0220 01/01/17 0352 01/01/17 1343 01/02/17 0253 01/02/17 1354 01/03/17 0413  NA 130* 130* 130* 130* 135 126* 129*  128*  K 3.6 3.6 3.5 3.9 3.9 4.2 4.7 4.7  CL 92* 94* 93* 94*  --  91* 94* 91*  CO2 27 24 27 26   --  25 25 26   GLUCOSE 169* 138* 213* 151* 100* 132* 120* 106*  BUN 30* 34* 15 28*  --  17 22* 28*  CREATININE 7.84* 8.97* 5.32* 7.16*  --  5.29* 6.07* 7.06*  CALCIUM 8.2* 8.0* 7.8* 8.1*  --  8.1* 8.3* 8.2*  PHOS  --  3.8  --   --   --   --   --   --    CBC  Recent Labs Lab 12/28/16 1836 12/29/16 0624  12/31/16 0220 01/01/17 0352 01/01/17 1343 01/02/17 0253 01/03/17 0413  WBC 8.1 8.2  < > 7.6 8.7  --  8.8 9.9  NEUTROABS 5.6 5.2  --   --   --   --   --   --   HGB 10.4* 10.2*  < > 9.6* 9.4* 11.9* 9.7* 9.2*  HCT 33.7* 33.9*  < > 31.6* 30.8* 35.0* 32.7* 30.3*  MCV 93.4 94.2  < > 92.4 92.2  --  94.0 92.9  PLT 335 331  < > 363 357  --  357 379  < > = values in this interval not displayed. Medications:    . carvedilol  6.25 mg Oral BID WC  . cinacalcet  30 mg Oral Q breakfast  . clopidogrel  75 mg Oral Q breakfast  . docusate sodium  200 mg Oral BID  . insulin aspart  0-15 Units Subcutaneous TID WC  . insulin aspart  0-5 Units Subcutaneous QHS  . insulin glargine  10 Units Subcutaneous Daily  . multivitamin  1 tablet Oral Q M,W,F  . mupirocin ointment  1 application Nasal BID  . pantoprazole  40 mg Oral Daily  . ramipril  10 mg Oral Daily  . sevelamer carbonate  2,400 mg Oral TID WC   Elmarie Shiley, MD 01/03/2017, 9:56 AM

## 2017-01-03 NOTE — Progress Notes (Signed)
  [facility] Progress Note  Subjective  - POD #2  Had seizure activity yesterday. Feels better today.   Objective Vitals:   01/03/17 0420 01/03/17 0822  BP: (!) 167/83 (!) 156/77  Pulse: 73 68  Resp: 12 15  Temp: 97.7 F (36.5 C) 98 F (36.7 C)    Intake/Output Summary (Last 24 hours) at 01/03/17 0929 Last data filed at 01/03/17 5361  Gross per 24 hour  Intake              439 ml  Output               50 ml  Net              389 ml   Alert eating breakfast.  Right foot VAC seal intact. Suction functioning properly  Assessment/Planning: 66 y.o. male is s/p: right TMA 2 Days Post-Op   Wound care orders clarified with RN. Keep incisional vac on until 01/08/17.  Patient stable from vascular standpoint for d/c. F/u in 2 weeks.   [name] 01/03/2017 9:29 AM --  Laboratory CBC    Component Value Date/Time   WBC 9.9 01/03/2017 0413   HGB 9.2 (L) 01/03/2017 0413   HCT 30.3 (L) 01/03/2017 0413   PLT 379 01/03/2017 0413    BMET    Component Value Date/Time   NA 128 (L) 01/03/2017 0413   K 4.7 01/03/2017 0413   CL 91 (L) 01/03/2017 0413   CO2 26 01/03/2017 0413   GLUCOSE 106 (H) 01/03/2017 0413   BUN 28 (H) 01/03/2017 0413   CREATININE 7.06 (H) 01/03/2017 0413   CALCIUM 8.2 (L) 01/03/2017 0413   GFRNONAA 7 (L) 01/03/2017 0413   GFRAA 8 (L) 01/03/2017 0413    COAG Lab Results  Component Value Date   INR 1.30 12/30/2016   No results found for: PTT  Antibiotics Anti-infectives    Start     Dose/Rate Route Frequency Ordered Stop   01/01/17 1200  vancomycin (VANCOCIN) IVPB 1000 mg/200 mL premix  Status:  Discontinued     1,000 mg 200 mL/hr over 60 Minutes Intravenous Every M-W-F (Hemodialysis) 12/31/16 1038 01/02/17 1210   12/30/16 1200  vancomycin (VANCOCIN) 1,500 mg in sodium chloride 0.9 % 250 mL IVPB  Status:  Discontinued     1,500 mg 250 mL/hr over 60 Minutes Intravenous Every M-W-F (Hemodialysis) 12/28/16 2111 12/31/16 1038     12/28/16 2200  aztreonam (AZACTAM) 500 mg in dextrose 5 % 50 mL IVPB  Status:  Discontinued     500 mg 100 mL/hr over 30 Minutes Intravenous Every 12 hours 12/28/16 2111 01/02/17 1210   12/28/16 2100  metroNIDAZOLE (FLAGYL) IVPB 500 mg  Status:  Discontinued     500 mg 100 mL/hr over 60 Minutes Intravenous Every 8 hours 12/28/16 2050 01/02/17 1210   12/28/16 1815  ciprofloxacin (CIPRO) IVPB 400 mg  Status:  Discontinued     400 mg 200 mL/hr over 60 Minutes Intravenous Every 24 hours 12/28/16 1810 12/28/16 2050       [name], PA-C [facility] Office: [phone] Pager: [phone] 01/03/2017 9:29 AM

## 2017-01-04 ENCOUNTER — Other Ambulatory Visit (HOSPITAL_COMMUNITY): Payer: Medicare Other

## 2017-01-04 ENCOUNTER — Telehealth: Payer: Self-pay | Admitting: Surgery

## 2017-01-04 DIAGNOSIS — R5381 Other malaise: Secondary | ICD-10-CM

## 2017-01-04 LAB — TSH: TSH: 6.956 u[IU]/mL — ABNORMAL HIGH (ref 0.350–4.500)

## 2017-01-04 LAB — BASIC METABOLIC PANEL
Anion gap: 11 (ref 5–15)
BUN: 37 mg/dL — AB (ref 6–20)
CHLORIDE: 93 mmol/L — AB (ref 101–111)
CO2: 25 mmol/L (ref 22–32)
CREATININE: 8.29 mg/dL — AB (ref 0.61–1.24)
Calcium: 7.9 mg/dL — ABNORMAL LOW (ref 8.9–10.3)
GFR calc Af Amer: 7 mL/min — ABNORMAL LOW (ref >60)
GFR, EST NON AFRICAN AMERICAN: 6 mL/min — AB (ref >60)
Glucose, Bld: 100 mg/dL — ABNORMAL HIGH (ref 65–99)
Potassium: 5.1 mmol/L (ref 3.5–5.1)
SODIUM: 129 mmol/L — AB (ref 135–145)

## 2017-01-04 LAB — CBC
HCT: 31.9 % — ABNORMAL LOW (ref 39.0–52.0)
Hemoglobin: 9.5 g/dL — ABNORMAL LOW (ref 13.0–17.0)
MCH: 27.9 pg (ref 26.0–34.0)
MCHC: 29.8 g/dL — AB (ref 30.0–36.0)
MCV: 93.5 fL (ref 78.0–100.0)
PLATELETS: 384 10*3/uL (ref 150–400)
RBC: 3.41 MIL/uL — ABNORMAL LOW (ref 4.22–5.81)
RDW: 16.3 % — ABNORMAL HIGH (ref 11.5–15.5)
WBC: 12.1 10*3/uL — ABNORMAL HIGH (ref 4.0–10.5)

## 2017-01-04 LAB — GLUCOSE, CAPILLARY
GLUCOSE-CAPILLARY: 56 mg/dL — AB (ref 65–99)
GLUCOSE-CAPILLARY: 70 mg/dL (ref 65–99)
Glucose-Capillary: 113 mg/dL — ABNORMAL HIGH (ref 65–99)
Glucose-Capillary: 148 mg/dL — ABNORMAL HIGH (ref 65–99)

## 2017-01-04 MED ORDER — BISACODYL 5 MG PO TBEC: 10.0000 mg | DELAYED_RELEASE_TABLET | Freq: Every day | ORAL | 0 refills | Status: DC | PRN

## 2017-01-04 MED ORDER — DEXTROSE 50 % IV SOLN
INTRAVENOUS | Status: AC
  Administered 2017-01-04: 50 mL
  Filled 2017-01-04: qty 50

## 2017-01-04 MED ORDER — OXYCODONE-ACETAMINOPHEN 5-325 MG PO TABS: 1.0000 | ORAL_TABLET | ORAL | 0 refills | Status: DC | PRN

## 2017-01-04 MED ORDER — CLOPIDOGREL BISULFATE 75 MG PO TABS: 75.0000 mg | ORAL_TABLET | Freq: Every day | ORAL | 0 refills | Status: DC

## 2017-01-04 MED ORDER — DARBEPOETIN ALFA 100 MCG/0.5ML IJ SOSY
100.0000 ug | PREFILLED_SYRINGE | INTRAMUSCULAR | Status: DC
  Administered 2017-01-04: 100 ug via INTRAVENOUS

## 2017-01-04 MED ORDER — DIVALPROEX SODIUM 500 MG PO DR TAB
500.0000 mg | DELAYED_RELEASE_TABLET | Freq: Two times a day (BID) | ORAL | Status: DC
  Administered 2017-01-04 – 2017-01-05 (×2): 500 mg via ORAL
  Filled 2017-01-04 (×2): qty 1

## 2017-01-04 MED ORDER — DIVALPROEX SODIUM 500 MG PO DR TAB: 500.0000 mg | DELAYED_RELEASE_TABLET | Freq: Two times a day (BID) | ORAL | 0 refills | Status: DC

## 2017-01-04 MED ORDER — DARBEPOETIN ALFA 100 MCG/0.5ML IJ SOSY
PREFILLED_SYRINGE | INTRAMUSCULAR | Status: AC
  Filled 2017-01-04: qty 0.5

## 2017-01-04 NOTE — Discharge Instructions (Signed)
Seizure, Adult When you have a seizure:  Parts of your body may move.  How aware or awake (conscious) you are may change.  You may shake (convulse).  Some people have symptoms right before a seizure happens. These symptoms may include:  Fear.  Worry (anxiety).  Feeling like you are going to throw up (nausea).  Feeling like the room is spinning (vertigo).  Feeling like you saw or heard something before (deja vu).  Odd tastes or smells.  Changes in vision, such as seeing flashing lights or spots.  Seizures usually last from 30 seconds to 2 minutes. Usually, they are not harmful unless they last a long time. Follow these instructions at home: Medicines  Take over-the-counter and prescription medicines only as told by your doctor.  Avoid anything that may keep your medicine from working, such as alcohol. Activity  Do not do any activities that would be dangerous if you had another seizure, like driving or swimming. Wait until your doctor approves.  If you live in the [place] ask your local DMV (department of motor vehicles) when you can drive.  Rest. Teaching others  Teach friends and family what to do when you have a seizure. They should: ? Lay you on the ground. ? Protect your head and body. ? Loosen any tight clothing around your neck. ? Turn you on your side. ? Stay with you until you are better. ? Not hold you down. ? Not put anything in your mouth. ? Know whether or not you need emergency care. General instructions  Contact your doctor each time you have a seizure.  Avoid anything that gives you seizures.  Keep a seizure diary. Write down: ? What you think caused each seizure. ? What you remember about each seizure.  Keep all follow-up visits as told by your doctor. This is important. Contact a doctor if:  You have another seizure.  You have seizures more often.  There is any change in what happens during your seizures.  You continue to have  seizures with treatment.  You have symptoms of being sick or having an infection. Get help right away if:  You have a seizure: ? That lasts longer than 5 minutes. ? That is different than seizures you had before. ? That makes it harder to breathe. ? After you hurt your head.  After a seizure, you cannot speak or use a part of your body.  After a seizure, you are confused or have a bad headache.  You have two or more seizures in a row.  You are having seizures more often.  You do not wake up right after a seizure.  You get hurt during a seizure. In an emergency:  These symptoms may be an emergency. Do not wait to see if the symptoms will go away. Get medical help right away. Call your local emergency services (911 in the [place] Do not drive yourself to the hospital. This information is not intended to replace advice given to you by your health care provider. Make sure you discuss any questions you have with your health care provider. Document Released: 01/06/2008 Document Revised: 04/01/2016 Document Reviewed: 04/01/2016 Elsevier Interactive Patient Education  2017 [employer]. Diabetes and Foot Care Diabetes may cause you to have problems because of poor blood supply (circulation) to your feet and legs. This may cause the skin on your feet to become thinner, break easier, and heal more slowly. Your skin may become dry, and the skin may peel and crack.  You may also have nerve damage in your legs and feet causing decreased feeling in them. You may not notice minor injuries to your feet that could lead to infections or more serious problems. Taking care of your feet is one of the most important things you can do for yourself. Follow these instructions at home:  Wear shoes at all times, even in the house. Do not go barefoot. Bare feet are easily injured.  Check your feet daily for blisters, cuts, and redness. If you cannot see the bottom of your feet, use a mirror or ask someone for  help.  Wash your feet with warm water (do not use hot water) and mild soap. Then pat your feet and the areas between your toes until they are completely dry. Do not soak your feet as this can dry your skin.  Apply a moisturizing lotion or petroleum jelly (that does not contain alcohol and is unscented) to the skin on your feet and to dry, brittle toenails. Do not apply lotion between your toes.  Trim your toenails straight across. Do not dig under them or around the cuticle. File the edges of your nails with an emery board or nail file.  Do not cut corns or calluses or try to remove them with medicine.  Wear clean socks or stockings every day. Make sure they are not too tight. Do not wear knee-high stockings since they may decrease blood flow to your legs.  Wear shoes that fit properly and have enough cushioning. To break in new shoes, wear them for just a few hours a day. This prevents you from injuring your feet. Always look in your shoes before you put them on to be sure there are no objects inside.  Do not cross your legs. This may decrease the blood flow to your feet.  If you find a minor scrape, cut, or break in the skin on your feet, keep it and the skin around it clean and dry. These areas may be cleansed with mild soap and water. Do not cleanse the area with peroxide, alcohol, or iodine.  When you remove an adhesive bandage, be sure not to damage the skin around it.  If you have a wound, look at it several times a day to make sure it is healing.  Do not use heating pads or hot water bottles. They may burn your skin. If you have lost feeling in your feet or legs, you may not know it is happening until it is too late.  Make sure your health care provider performs a complete foot exam at least annually or more often if you have foot problems. Report any cuts, sores, or bruises to your health care provider immediately. Contact a health care provider if:  You have an injury that is not  healing.  You have cuts or breaks in the skin.  You have an ingrown nail.  You notice redness on your legs or feet.  You feel burning or tingling in your legs or feet.  You have pain or cramps in your legs and feet.  Your legs or feet are numb.  Your feet always feel cold. Get help right away if:  There is increasing redness, swelling, or pain in or around a wound.  There is a red line that goes up your leg.  Pus is coming from a wound.  You develop a fever or as directed by your health care provider.  You notice a bad smell coming from an ulcer  or wound. This information is not intended to replace advice given to you by your health care provider. Make sure you discuss any questions you have with your health care provider. Document Released: 07/17/2000 Document Revised: 12/26/2015 Document Reviewed: 12/27/2012 Elsevier Interactive Patient Education  2017 [employer].

## 2017-01-04 NOTE — Telephone Encounter (Signed)
Sched appt 01/18/17 at 2:30. Lm on hm# for pt to confirm appt.

## 2017-01-04 NOTE — Care Management Note (Addendum)
Case Management Note  Patient Details  Name: Jacob [name] MRN: [id number] Date of Birth: [birth year]  Subjective/Objective:   POD 2 right TMA, has prevena incisional wound vac on , need to keep on til 6/8 per MD note.  Pt eval will see patient today and make recommendations,  Patient lives alone, sister helps him out and his ex wife.  He states his sister gets off work at 6 pm today and she has the key to let him in the house. Per pt recs SNF, CSW referral.                  Action/Plan: NCM will follow  Along with CSW for dc needs.   Expected Discharge Date:                  Expected Discharge Plan:     In-House Referral:     Discharge planning Services  CM Consult  Post Acute Care Choice:    Choice offered to:     DME Arranged:    DME Agency:     HH Arranged:    HH Agency:     Status of Service:  In process, will continue to follow  If discussed at Long Length of Stay Meetings, dates discussed:    Additional Comments:  Zenon Mayo, RN 01/04/2017, 2:39 PM

## 2017-01-04 NOTE — Progress Notes (Signed)
Patient ID: Jacob [name], male   DOB: [birth year], 66 y.o.   MRN: [id number]  [facility] Progress Note   Subjective:    No further seizures reported. No complaints presently.    Objective:   BP (!) 119/46   Pulse 72   Temp 98.5 F (36.9 C) (Oral)   Resp 18   Ht 6\' 2"  (1.88 m)   Wt (!) 137.7 kg (303 lb 9.2 oz)   SpO2 99%   BMI 38.98 kg/m   Physical Exam: Seen in HD Comfortable in bed Scab upper chest (sys site of prior HD catheter) with cystic lesion superior to that which he says is chronic Lungs clear Cardiac rhythm regular Normal S1S2 No S3 or murmur Abd obese, not tender R TMA with wound VAC R upper arm AVF cannulated for HD   Recent Labs Lab 12/30/16 2040 12/31/16 0220 01/01/17 0352 01/01/17 1343 01/02/17 0253 01/02/17 1354 01/03/17 0413 01/04/17 0204  NA 130* 130* 130* 135 126* 129* 128* 129*  K 3.6 3.5 3.9 3.9 4.2 4.7 4.7 5.1  CL 94* 93* 94*  --  91* 94* 91* 93*  CO2 24 27 26   --  25 25 26 25   GLUCOSE 138* 213* 151* 100* 132* 120* 106* 100*  BUN 34* 15 28*  --  17 22* 28* 37*  CREATININE 8.97* 5.32* 7.16*  --  5.29* 6.07* 7.06* 8.29*  CALCIUM 8.0* 7.8* 8.1*  --  8.1* 8.3* 8.2* 7.9*  PHOS 3.8  --   --   --   --   --   --   --    CBC  Recent Labs Lab 12/28/16 1836 12/29/16 0624  01/01/17 0352 01/01/17 1343 01/02/17 0253 01/03/17 0413 01/04/17 0204  WBC 8.1 8.2  < > 8.7  --  8.8 9.9 12.1*  NEUTROABS 5.6 5.2  --   --   --   --   --   --   HGB 10.4* 10.2*  < > 9.4* 11.9* 9.7* 9.2* 9.5*  HCT 33.7* 33.9*  < > 30.8* 35.0* 32.7* 30.3* 31.9*  MCV 93.4 94.2  < > 92.2  --  94.0 92.9 93.5  PLT 335 331  < > 357  --  357 379 384  < > = values in this interval not displayed.    Ct Head Wo Contrast  Result Date: 01/02/2017 CLINICAL DATA:  Patient with syncopal episode. EXAM: CT HEAD WITHOUT CONTRAST TECHNIQUE: Contiguous axial images were obtained from the base of the skull through the vertex without intravenous contrast. COMPARISON:  Brain  CT 04/05/2016 FINDINGS: Brain: Ventricles and sulci are appropriate for patient's age. No evidence for acute cortically based infarct, intracranial hemorrhage, mass lesion or mass-effect. Periventricular and subcortical white matter hypodensity compatible with chronic microvascular ischemic changes. Vascular: Internal carotid arterial vascular calcifications. Skull:  Intact. Sinuses/Orbits: Minimal fluid within the mastoid air cells bilaterally. Paranasal sinuses are well aerated. Orbits are unremarkable. Other: None. IMPRESSION: No acute intracranial process. Atrophy and chronic microvascular ischemic changes. Electronically Signed   By: Lovey Newcomer M.D.   On: 01/02/2017 17:41   Medications:    . carvedilol  6.25 mg Oral BID WC  . cinacalcet  30 mg Oral Q breakfast  . clopidogrel  75 mg Oral Q breakfast  . docusate sodium  200 mg Oral BID  . insulin aspart  0-15 Units Subcutaneous TID WC  . insulin aspart  0-5 Units Subcutaneous QHS  . insulin glargine  10 Units  Subcutaneous Daily  . multivitamin  1 tablet Oral Q M,W,F  . pantoprazole  40 mg Oral Daily  . ramipril  10 mg Oral Daily  . sevelamer carbonate  2,400 mg Oral TID WC   . valproate sodium Stopped (01/04/17 0700)   Assessment/ Plan:    1. Diabetic right foot ulcer with gangrene/cellulitis/PAD. S/p TMA S/p TMA 6/1 post angiogram/PTA/atherectomy. Wound VAC in place.    2. ESRD - MWF [facility] (Dr. Hinda Lenis). HD today on schedule. Pre weight 135.5. EDW 134.5. 3. Hypertension: BP meds. Well controlled at present.   4. Anemia 2/2 ESRD. Hb <10. Supposed to be on ESA - don't see anything administered this admission. Dose Aranesp 100/week. Start today. No Fe 2/2 infection.  5. Metabolic bone disease: Restarted back on Sensipar for PTH suppression, on sevelamer for phosphorus binding.Last phosphorus was 5/30. Recheck in AM.  6. Seizures - on IV Depakote. EEG scheduled for tomorrow. Etiology not clear. Only electrolyte issue was drop in Na  135->126 but on review of all Na data that 135 appears to be an outlier and done by I-stat so I'm not convinced has anything to do with the sz.   Jamal Maes, MD [facility] [phone] Pager 01/04/2017, 11:10 AM

## 2017-01-04 NOTE — Discharge Summary (Addendum)
Physician Discharge Summary  Jacob [name] VXY:[id number] DOB: August 04, 1950 DOA: 12/28/2016  PCP: Jacob Sites, MD  Admit date: 12/28/2016 Discharge date: 01/04/2017  Time spent: 45 minutes  Recommendations for Outpatient Follow-up:  Patient will be discharged to skilled nursing facility. Continue physical and occupational therapy.   Patient will need to follow up with primary care provider, Dr. Hilma Favors on 01/14/17 at 9:45am.  Follow up with Dr. Trula Slade, vascular surgery, in 2 weeks.  Continue dialysis as scheduled.   Follow up with neurology January 19, 2017 at 10am.   Patient should continue medications as prescribed.   Patient should follow a heart healthy/carb modified diet.  Do not drive.  Discharge Diagnoses:  Acute seizure Diabetic foot ulcers with gangrene/cellulitis End-stage renal disease on hemodialysis Diabetes mellitus, type II Essential hypertension Anemia secondary to chronic disease Constipation Hyponatremia  Discharge Condition: Stable  Diet recommendation: heart healthy/carb modified  Filed Weights   01/02/17 1521 01/04/17 0855 01/04/17 1332  Weight: (!) 137.7 kg (303 lb 9.2 oz) (!) 136.5 kg (300 lb 14.9 oz) 132.5 kg (292 lb 1.8 oz)    History of present illness:  on 12/28/2016 by Dr. Sydnee Cabal R [name] a 66 y.o.malewith medical history significant forhypertension, insulin-dependent diabetes mellitus, chronic normocytic anemia, and end-stage renal disease on hemodialysis, now presenting to the emergency department for evaluation of right foot wound with purulent drainage and foul odor. Patient reports that he noted a small sore on the second right toe several months ago and states that it has slowly, but progressively worsened since that time. This was noted by someone at the dialysis center and he received vancomycin with HD for the past week. Despite this, there continues to be purulent discharge and foul odor from the foot and neweratrophy of the  second toe as well as darkening of the forefoot. [name] denies fevers or chills, denies chest pain or palpitations, and denies any increase in his chronic dyspnea. He has not been attempting any interventions for these symptoms, and actually denies any pain from the foot.  Hospital Course:  Acute seizure -On 01/02/2017,patient witnessed having a seizure -Suspect secondary to hyponatremia. Patient dialyzed on 01/02/17, sodium noted to be 135 in the afternoon, however dropped to 126 on the morning on 6/3. Patient's sodium at baseline normally around 130.  -Patient was moved to stepdown for closer monitoring -Loaded with valproate (38m/kg), with continued dose of 510mkg TID -Currently AAOx3. However does not remember the events that occurred on 6/3. -CT head obtained and was unremarkable -Discussed with Dr. LiCheral Markerneurology, no further recommendations, agrees with the above management -Sodium currently 129 -Discussed with neurology, given that patient is HD patient, his sodium levels will likely continue to fluctuate. Will discharge patient with depakote 50068mID. Will need levels checked and follow up with neurology as an outpatient. -[place] for [place] with Dr. DooMerlene Laughter 01/19/2017 at 10am. -EEG unremarkable  Diabetic foot ulcers with gangrene/cellulitis -Affecting the right foot first and second metatarsals -X-rays negative for osteomyelitis -Patient currently afebrile with no leukocytosis however has been on vancomycin for the past week with hemodialysis -ESR 102, CRP 15.6 -Vascular surgery consulted and appreciated, s/p aortogram with runoff, intervention (see complete list under procedures) and s/p metatarsal amputation 01/01/2017 -Was placed on aztreonam, vancomycin, Flagyl (patient has penicillin allergy)- will discontinue antibiotics as patient is now s/p transmetatarsal amputation  -Continue plavix -ABI done on 12/31/2016: Right 2.4/1.54, left  1.04 -patient has wound  vac in place until 01/08/2017 -PT consulted recommended SNF (patient lives alone)  -Patient will need to follow up with vascular in 2 weeks  End-stage renal disease on hemodialysis -Patient dialyzes Monday, Wednesday, Friday -Nephrology consultation appreciated -Discussed with Dr. Posey Pronto, patient has been using his fistula, HD catheter may be removed -HD catheter removed on 12/30/2016  Diabetes mellitus, type II -Continue home regimen at discharge  Essential hypertension -Continue Coreg, ramipril  Anemia secondary to chronic disease -Hemoglobin currently 9.5  Constipation -Resolved -Suspect secondary to pain medications and recent immobility   Hyponatremia -Chronic -Currently 129 -Continue to monitor BMP during HD -Pending TSH (however, given acute illness, may not be beneficial)- may be followed by PCP  Procedures: US guided cannulation of left common femoral artery; Aortogram with right lower extremity runoff;Drug coated balloon angioplasty of right popliteal artery with 8m impact admiral; Atherectomy with 1.25 micro csi and pta with 355mballoon of right pt artery; Atherectomy with 1.25 micro csi and pta with 59m27malloon of right at artery and 2mm36mlloon of right dp artery; Moderate sedation with fentnyl and versed for 121 minutes; Removal of right IJ tunneled dialysis catheter  ABI  Consultations: Vascular surgery Nephrology Neurology via phone  Discharge Exam: Vitals:   01/04/17 1300 01/04/17 1332  BP: 137/67 (!) 139/47  Pulse: 72 73  Resp: 14 16  Temp:  98.3 F (36.8 C)   Patient feeling better. Feels pain is controlled. Denies chest pain, shortness of breath, abdominal pain. Able to have bowel movement.    General: Well developed, well nourished, NAD, appears stated age  HEEN79AT,mucous membranes moist.  Cardiovascular: S1 S2 auscultated, no rubs, murmurs or gallops. Regular rate and rhythm.  Respiratory: Clear to  auscultation bilaterally with equal chest rise  Abdomen: Soft, nontender, nondistended, + bowel sounds  Extremities: warm dry without cyanosis clubbing. LE edema. Right TMA with vac in place  Neuro: AAOx3, nonfocal  Psych: Normal affect and demeanor, pleasant  Discharge Instructions Discharge Instructions    Discharge instructions    Complete by:  As directed    Patient will be discharged to skilled nursing facility. Continue physical and occupational therapy.   Patient will need to follow up with primary care provider, Dr. GoldHilma Favors6/14/18 at 9:45am.  Follow up with Dr. BrabTrula Sladescular surgery, in 2 weeks.  Continue dialysis as scheduled.   Follow up with neurology January 19, 2017 at 10am.   Patient should continue medications as prescribed.   Patient should follow a heart healthy/carb modified diet.  Do not drive.   Discharge wound care:    Complete by:  As directed    You may shower, but must disconnect your VAC tubing from machine and keep right foot dry. Keep VAC dressing on right foot until 01/08/17. When at home, keep VAC [facility] charged. Has battery life of 6 hours.  Peel off dressing on 01/08/17 on right foot. Then wash right foot daily with soap and water and pat dry. Apply dry gauze to incision and wrap foot with gauze wrap and ACE daily.     Current Discharge Medication List    START taking these medications   Details  bisacodyl (DULCOLAX) 5 MG EC tablet Take 2 tablets (10 mg total) by mouth daily as needed for moderate constipation. Qty: 30 tablet, Refills: 0    clopidogrel (PLAVIX) 75 MG tablet Take 1 tablet (75 mg total) by mouth daily with breakfast. Qty: 30 tablet, Refills: 0    divalproex (DEPAKOTE) 500  MG DR tablet Take 1 tablet (500 mg total) by mouth every 12 (twelve) hours. Qty: 60 tablet, Refills: 0    oxyCODONE-acetaminophen (PERCOCET/ROXICET) 5-325 MG tablet Take 1 tablet by mouth every 4 (four) hours as needed for moderate pain. Qty: 15 tablet,  Refills: 0      CONTINUE these medications which have NOT CHANGED   Details  acetaminophen (TYLENOL) 500 MG tablet Take 1,000 mg by mouth every 6 (six) hours as needed for mild pain.    aspirin EC 325 MG tablet Take 325 mg by mouth daily.    B Complex-C-Folic Acid (DIALYVITE TABLET) TABS Take 1 tablet by mouth every Monday, Wednesday, and Friday.     carvedilol (COREG) 6.25 MG tablet Take 6.25 mg by mouth 2 (two) times daily with a meal. Take 6.4ms once daily on dialysis days, Mondays, Wednesdays, and Fridays    cinacalcet (SENSIPAR) 30 MG tablet Take 30 mg by mouth daily.    EPIPEN 2-PAK 0.3 MG/0.3ML SOAJ injection Inject 0.3 mg into the muscle once as needed (For anaphylaxis.).     LANTUS SOLOSTAR 100 UNIT/ML Solostar Pen Inject 20 Units into the skin at bedtime. If blood sugar is lower than 150 only inject 20 units at bedtime    loratadine (CLARITIN) 10 MG tablet Take 10 mg by mouth daily as needed for allergies.    omeprazole (PRILOSEC) 40 MG capsule Take 40 mg by mouth daily.     ramipril (ALTACE) 10 MG capsule Take 10 mg by mouth daily.     RENVELA 800 MG tablet Take 800-2,400 mg by mouth 3 (three) times daily with meals. Take 2400 mg by mouth 3 times daily with meals and 800 mg by mouth with snacks       Allergies  Allergen Reactions  . Bee Venom Anaphylaxis  . Penicillins Swelling and Other (See Comments)    SWELLING REACTION UNSPECIFIED   Has patient had a PCN reaction causing immediate rash, facial/tongue/throat swelling, SOB or lightheadedness with hypotension: No Has patient had a PCN reaction causing severe rash involving mucus membranes or skin necrosis: No Has patient had a PCN reaction that required hospitalization No Has patient had a PCN reaction occurring within the last 10 years: No If all of the above answers are "NO", then may proceed with Cephalosporin use.   Follow-up Information    BSerafina Mitchell MD Follow up in 2 week(s).   Specialties:   Vascular Surgery, Cardiology Why:  Our office will call you to arrange an appointment Contact information: [phone]        DPhillips Odor MD. Go on 01/19/2017.   Specialty:  Neurology Why:  at 10 pm Contact information: [address] [place] [phone]        GSharilyn Sites MD. Schedule an appointment as soon as possible for a visit on 01/14/2017.   Specialty:  Family Medicine Why:  Hospital follow up, at 9:45 am. You will also need a neurology referral. Contact information: [address] [phone]           The results of significant diagnostics from this hospitalization (including imaging, microbiology, ancillary and laboratory) are listed below for reference.    Significant Diagnostic Studies: Ct Head Wo Contrast  Result Date: 01/02/2017 CLINICAL DATA:  Patient with syncopal episode. EXAM: CT HEAD WITHOUT CONTRAST TECHNIQUE: Contiguous axial images were obtained from the base of the skull through the vertex without intravenous contrast.  COMPARISON:  Brain CT 04/05/2016 FINDINGS: Brain: Ventricles and sulci are appropriate for patient's age. No evidence for acute cortically based infarct, intracranial hemorrhage, mass lesion or mass-effect. Periventricular and subcortical white matter hypodensity compatible with chronic microvascular ischemic changes. Vascular: Internal carotid arterial vascular calcifications. Skull:  Intact. Sinuses/Orbits: Minimal fluid within the mastoid air cells bilaterally. Paranasal sinuses are well aerated. Orbits are unremarkable. Other: None. IMPRESSION: No acute intracranial process. Atrophy and chronic microvascular ischemic changes. Electronically Signed   By: Lovey Newcomer M.D.   On: 01/02/2017 17:41   Dg Foot Complete Right  Result Date: 12/28/2016 CLINICAL DATA:  Right foot swelling, peripheral vascular disease, necrotic 1st and 2nd toe EXAM: RIGHT  FOOT COMPLETE - 3+ VIEW COMPARISON:  None. FINDINGS: No fracture or dislocation is seen. The joint spaces are preserved. Soft tissue ulceration overlying the distal 2nd and 3rd digits. No underlying cortical destruction. IMPRESSION: Soft tissue ulceration overlying the distal 2nd and 3rd digits. No underlying cortical destruction. Electronically Signed   By: Julian Hy M.D.   On: 12/28/2016 19:51    Microbiology: Recent Results (from the past 240 hour(s))  MRSA PCR Screening     Status: Abnormal   Collection Time: 12/29/16 12:54 AM  Result Value Ref Range Status   MRSA by PCR POSITIVE (A) NEGATIVE Final    Comment:        The GeneXpert MRSA Assay (FDA approved for NASAL specimens only), is one component of a comprehensive MRSA colonization surveillance program. It is not intended to diagnose MRSA infection nor to guide or monitor treatment for MRSA infections. RESULT CALLED TO, READ BACK BY AND VERIFIED WITH: BULLINS,M @ 833 BY MATTHEWS,B      Labs: Basic Metabolic Panel:  Recent Labs Lab 12/29/16 0624  12/30/16 2040  01/01/17 0352 01/01/17 1343 01/02/17 0253 01/02/17 1354 01/03/17 0413 01/04/17 0204  NA 132*  < > 130*  < > 130* 135 126* 129* 128* 129*  K 3.2*  < > 3.6  < > 3.9 3.9 4.2 4.7 4.7 5.1  CL 92*  < > 94*  < > 94*  --  91* 94* 91* 93*  CO2 29  < > 24  < > 26  --  '25 25 26 25  ' GLUCOSE 190*  < > 138*  < > 151* 100* 132* 120* 106* 100*  BUN 25*  < > 34*  < > 28*  --  17 22* 28* 37*  CREATININE 6.48*  < > 8.97*  < > 7.16*  --  5.29* 6.07* 7.06* 8.29*  CALCIUM 8.2*  < > 8.0*  < > 8.1*  --  8.1* 8.3* 8.2* 7.9*  MG 1.9  --   --   --   --   --   --   --   --   --   PHOS  --   --  3.8  --   --   --   --   --   --   --   < > = values in this interval not displayed. Liver Function Tests:  Recent Labs Lab 12/28/16 1836 12/30/16 2040  AST 18  --   ALT 5*  --   ALKPHOS 141*  --   BILITOT 0.4  --   PROT 9.0*  --   ALBUMIN 2.9* 2.4*   No results for  input(s): LIPASE, AMYLASE in the last 168 hours. No results for input(s): AMMONIA in the last 168 hours. CBC:  Recent Labs  Lab 12/28/16 1836 12/29/16 7902  12/31/16 0220 01/01/17 0352 01/01/17 1343 01/02/17 0253 01/03/17 0413 01/04/17 0204  WBC 8.1 8.2  < > 7.6 8.7  --  8.8 9.9 12.1*  NEUTROABS 5.6 5.2  --   --   --   --   --   --   --   HGB 10.4* 10.2*  < > 9.6* 9.4* 11.9* 9.7* 9.2* 9.5*  HCT 33.7* 33.9*  < > 31.6* 30.8* 35.0* 32.7* 30.3* 31.9*  MCV 93.4 94.2  < > 92.4 92.2  --  94.0 92.9 93.5  PLT 335 331  < > 363 357  --  357 379 384  < > = values in this interval not displayed. Cardiac Enzymes: No results for input(s): CKTOTAL, CKMB, CKMBINDEX, TROPONINI in the last 168 hours. BNP: BNP (last 3 results) No results for input(s): BNP in the last 8760 hours.  ProBNP (last 3 results) No results for input(s): PROBNP in the last 8760 hours.  CBG:  Recent Labs Lab 01/03/17 1623 01/03/17 1806 01/03/17 2145 01/04/17 0754 01/04/17 0837  GLUCAP 138* 136* 118* 56* 113*       Signed:  Lizzeth Meder  [facility] 01/04/2017, 3:32 PM

## 2017-01-04 NOTE — Progress Notes (Signed)
Physical Therapy Treatment Patient Details Name: Jacob [name] MRN: [id number] DOB: 04-21-1951 Today's Date: 01/04/2017    History of Present Illness Patient is a 66 yo male admitted 12/28/16 with gangrene of Rt foot, now s/p Rt transmetatarsal amputation.  Patient with seizure activity 01/02/17 and transferred to SDU.      PMH:  HTN, DM, anemia, ESRD on HD, anxiety, depression, CVA, NICM, arthritis    PT Comments    Pt is not progressing as well as anticipated on initial evaluation.  He was able to stand with one person mod assist from a maximally elevated bed with RW x 2, but every time we attempted to take steps to the recliner chair his legs buckled and he sat back down on the bed.  We used the standing frame to safely transfer to the recliner chair today.  He is not safe to d/c home alone, and even with assist would not be safe at this time.  PT now recommending SNF placement.  We will continue to follow acutely and he would benefit from 5 days per week of therapy at the SNF.    Follow Up Recommendations  SNF     Equipment Recommendations  3in1 (PT)    Recommendations for Other Services   NA     Precautions / Restrictions Precautions Precautions: Fall Precaution Comments: VAC on Rt foot at operative site Restrictions Weight Bearing Restrictions: No (none orders, no mention in notes)    Mobility  Bed Mobility Overal bed mobility: Needs Assistance Bed Mobility: Supine to Sit Rolling: Min assist   Supine to sit: HOB elevated;Min assist     General bed mobility comments: Heavy min hand held assist with HOB maximally elevated to get EOB.  Pt able to progress both legs over, but needed assist at his trunk and to weight shift his hips out.   Transfers Overall transfer level: Needs assistance Equipment used: Rolling walker (2 wheeled) Transfers: Sit to/from Stand Sit to Stand: From elevated surface;Mod assist         General transfer comment: Stood x 3 from elevated bed x 2  with RW and x 1 with steady standing frame.  Assist needed at trunk to power up and to steady RW during transitions.  First stand he LOB back and landed on the bed, second stand he was unable to move his feet to transfer to the chair.  Third stand in steady standing frame went well and he remained standing for the transfer to the chair in the standing frame.  Pt will likely need two people to get up from lower recliner chair later.   Ambulation/Gait             General Gait Details: unable to move feet without buckling back down to the bed.           Balance Overall balance assessment: Needs assistance Sitting-balance support: Feet supported;No upper extremity supported Sitting balance-Leahy Scale: Good     Standing balance support: Bilateral upper extremity supported Standing balance-Leahy Scale: Poor Standing balance comment: needs RW and extenral assist from PT                            Cognition Arousal/Alertness: Awake/alert Behavior During Therapy: WFL for tasks assessed/performed Overall Cognitive Status: Within Functional Limits for tasks assessed  Pertinent Vitals/Pain Pain Assessment: Faces Faces Pain Scale: Hurts little more Pain Location: right foot Pain Descriptors / Indicators: Constant;Throbbing Pain Intervention(s): Limited activity within patient's tolerance;Monitored during session;Repositioned           PT Goals (current goals can now be found in the care plan section) Acute Rehab PT Goals Patient Stated Goal: To walk Progress towards PT goals: Progressing toward goals    Frequency    Min 3X/week (would benefit from 5 or more days of therapy at SNF)      PT Plan Discharge plan needs to be updated       AM-PAC PT "6 Clicks" Daily Activity  Outcome Measure  Difficulty turning over in bed (including adjusting bedclothes, sheets and blankets)?: Total Difficulty  moving from lying on back to sitting on the side of the bed? : Total Difficulty sitting down on and standing up from a chair with arms (e.g., wheelchair, bedside commode, etc,.)?: Total Help needed moving to and from a bed to chair (including a wheelchair)?: A Lot Help needed walking in hospital room?: Total Help needed climbing 3-5 steps with a railing? : Total 6 Click Score: 7    End of Session Equipment Utilized During Treatment: Gait belt Activity Tolerance: Patient limited by pain Patient left: in chair;with call bell/phone within reach;with chair alarm set Nurse Communication: Mobility status;Need for lift equipment PT Visit Diagnosis: Unsteadiness on feet (R26.81);Difficulty in walking, not elsewhere classified (R26.2);Pain;Muscle weakness (generalized) (M62.81) Pain - Right/Left: Right Pain - part of body: Ankle and joints of foot     Time: 7034-0352 PT Time Calculation (min) (ACUTE ONLY): 43 min  Charges:  $Therapeutic Activity: 38-52 mins          Kamaree Berkel B. [place], [place], DPT [phone]            01/04/2017, 3:37 PM

## 2017-01-04 NOTE — Progress Notes (Signed)
PT Cancellation Note  Patient Details Name: MIRO BALDERSON MRN: [id number] DOB: January 14, 1951   Cancelled Treatment:    Reason Eval/Treat Not Completed: Patient at procedure or test/unavailable.  Pt is at HD.  PT to check back later today or tomorrow as time allows.  Thanks,    Barbarann Ehlers. [place], [place], DPT [phone]   01/04/2017, 11:25 AM

## 2017-01-04 NOTE — NC FL2 (Signed)
[place] LEVEL OF CARE SCREENING TOOL     IDENTIFICATION  Patient Name: Jacob [name] Birthdate: 1950/12/18 Sex: male Admission Date (Current Location): 12/28/2016  [facility] and [place] Number:  [employer] and Address:  [facility], [place] [address], [place], [place] [address]      Provider Number: [id number]  Attending Physician Name and Address:  Cristal Ford, DO  Relative Name and Phone Number:       Current Level of Care: Hospital Recommended Level of Care: [place] Prior Approval Number:    Date Approved/Denied:   PASRR Number: [id number]  Discharge Plan: SNF    Current Diagnoses: Patient Active Problem List   Diagnosis Date Noted  . Hypokalemia 12/28/2016  . Insulin-requiring or dependent type II diabetes mellitus ([place]) 12/28/2016  . Gangrene of foot ([place]) 12/28/2016  . Gangrene of right foot ([place]) 12/28/2016  . Hyperkalemia 04/05/2016  . Inadequate dialysis 04/05/2016  . Other complications due to renal dialysis device, implant, and graft 04/28/2013  . Subclavian vein occlusion (HCC) 04/28/2013  . Preoperative cardiovascular examination 12/28/2012  . Mixed hyperlipidemia 06/16/2011  . Nonischemic cardiomyopathy ([place]) 11/20/2010  . End-stage renal disease on hemodialysis ([place]) 11/20/2010  . Essential hypertension, benign 11/20/2010    Orientation RESPIRATION BLADDER Height & Weight     Self, Time, Place  O2 (2L [place]) Incontinent Weight: 292 lb 1.8 oz (132.5 kg) Height:  6\' 2"  (188 cm)  BEHAVIORAL SYMPTOMS/MOOD NEUROLOGICAL BOWEL NUTRITION STATUS      Continent Diet (carb modified; renal)  AMBULATORY STATUS COMMUNICATION OF NEEDS Skin   Extensive Assist Verbally Surgical wounds, Wound Vac (right TMA with Prevena wound vac)                       Personal Care Assistance Level of Assistance  Bathing, Dressing Bathing Assistance: Maximum assistance   Dressing Assistance: Maximum  assistance     Functional Limitations Info             SPECIAL CARE FACTORS FREQUENCY  PT (By licensed PT), OT (By licensed OT)     PT Frequency: 5/wk OT Frequency: 5/wk            Contractures      Additional Factors Info  Code Status, Allergies, Isolation Precautions Code Status Info: FULL Allergies Info: Bee Venom, Penicillins     Isolation Precautions Info: MRSA     Current Medications (01/04/2017):  This is the current hospital active medication list Current Facility-Administered Medications  Medication Dose Route Frequency Provider Last Rate Last Dose  . acetaminophen (TYLENOL) tablet 650 mg  650 mg Oral Q4H PRN Waynetta Sandy, MD      . bisacodyl (DULCOLAX) EC tablet 10 mg  10 mg Oral Daily PRN Cristal Ford, DO   10 mg at 01/02/17 1154  . carvedilol (COREG) tablet 6.25 mg  6.25 mg Oral BID WC Opyd, Ilene Qua, MD   6.25 mg at 01/03/17 1623  . cinacalcet (SENSIPAR) tablet 30 mg  30 mg Oral Q breakfast Opyd, Ilene Qua, MD   30 mg at 01/03/17 0925  . clopidogrel (PLAVIX) tablet 75 mg  75 mg Oral Q breakfast Waynetta Sandy, MD   75 mg at 01/03/17 1660  . Darbepoetin Alfa (ARANESP) injection 100 mcg  100 mcg Intravenous Q Mon-HD Jamal Maes, MD   100 mcg at 01/04/17 1200  . divalproex (DEPAKOTE) DR tablet 500 mg  500 mg  Oral Q12H Mikhail, [place], DO      . docusate sodium (COLACE) capsule 200 mg  200 mg Oral BID Cristal Ford, DO   200 mg at 01/03/17 2118  . HYDROcodone-acetaminophen (NORCO/VICODIN) 5-325 MG per tablet 1-2 tablet  1-2 tablet Oral Q4H PRN Opyd, Ilene Qua, MD   2 tablet at 01/04/17 0300  . insulin aspart (novoLOG) injection 0-15 Units  0-15 Units Subcutaneous TID WC Emokpae, Ejiroghene E, MD   2 Units at 01/03/17 1700  . insulin aspart (novoLOG) injection 0-5 Units  0-5 Units Subcutaneous QHS Emokpae, Ejiroghene E, MD      . insulin glargine (LANTUS) injection 10 Units  10 Units Subcutaneous Daily Emokpae, Ejiroghene E, MD    10 Units at 01/03/17 1425  . loratadine (CLARITIN) tablet 10 mg  10 mg Oral Daily PRN Opyd, Ilene Qua, MD   10 mg at 01/03/17 1622  . LORazepam (ATIVAN) injection 1 mg  1 mg Intravenous Q2H PRN Cristal Ford, DO      . morphine 4 MG/ML injection 2 mg  2 mg Intravenous Q1H PRN Waynetta Sandy, MD   2 mg at 01/03/17 1101  . multivitamin (RENA-VIT) tablet 1 tablet  1 tablet Oral Q M,W,F Opyd, Ilene Qua, MD   1 tablet at 01/02/17 0751  . ondansetron (ZOFRAN) tablet 4 mg  4 mg Oral Q6H PRN Opyd, Ilene Qua, MD       Or  . ondansetron (ZOFRAN) injection 4 mg  4 mg Intravenous Q6H PRN Opyd, Ilene Qua, MD      . oxyCODONE-acetaminophen (PERCOCET/ROXICET) 5-325 MG per tablet 1-2 tablet  1-2 tablet Oral Q3H PRN Waynetta Sandy, MD   2 tablet at 12/30/16 2351  . pantoprazole (PROTONIX) EC tablet 40 mg  40 mg Oral Daily Opyd, Ilene Qua, MD   40 mg at 01/03/17 0925  . polyethylene glycol (MIRALAX / GLYCOLAX) packet 17 g  17 g Oral Daily PRN Opyd, Ilene Qua, MD   17 g at 12/31/16 0956  . ramipril (ALTACE) capsule 10 mg  10 mg Oral Daily Emokpae, Ejiroghene E, MD   10 mg at 01/03/17 1200  . sevelamer carbonate (RENVELA) tablet 2,400 mg  2,400 mg Oral TID WC Opyd, Ilene Qua, MD   2,400 mg at 01/03/17 1804     Discharge Medications: Please see discharge summary for a list of discharge medications.  Relevant Imaging Results:  Relevant Lab Results:   Additional Information SS#: [id number], pt gets dialysis MWF at [facility] dialysis center  [place], [place], [place]

## 2017-01-04 NOTE — Telephone Encounter (Signed)
-----   Message from Mena Goes, RN sent at 01/04/2017  9:17 AM EDT ----- Regarding: 2 weeks    ----- Message ----- From: [name], PA-C Sent: 01/02/2017   9:53 AM To: Vvs Charge Pool  S/p right transmetatarsal amputation 01/01/17  F/u with Dr. Trula Slade in 2 weeks.   Thanks Maudie Mercury

## 2017-01-04 NOTE — Progress Notes (Signed)
Pt in dialysis; EEG rescheduled for 6/5.  RN aware.

## 2017-01-04 NOTE — Procedures (Signed)
I have personally attended this patient's dialysis session.   2K bath Weight 135.5 w/outpt EDW 134.5 BP soft 90's R AVF cannulated for HD  Jamal Maes, MD [facility] [phone] Pager 01/04/2017, 11:40 AM

## 2017-01-04 NOTE — Progress Notes (Signed)
PROGRESS NOTE    Jacob [name]  HBZ:[id number] DOB: October 17, 1950 DOA: 12/28/2016 PCP: Jacob Sites, MD   Chief Complaint  Patient presents with  . Nail Problem    Brief Narrative:  HPI on 12/28/2016 by Dr. Christia Reading Opyd [name] is a 66 y.o. male with medical history significant for hypertension, insulin-dependent diabetes mellitus, chronic normocytic anemia, and end-stage renal disease on hemodialysis, now presenting to the emergency department for evaluation of right foot wound with purulent drainage and foul odor. Patient reports that he noted a small sore on the second right toe several months ago and states that it has slowly, but progressively worsened since that time. This was noted by someone at the dialysis center and he received vancomycin with HD for the past week. Despite this, there continues to be purulent discharge and foul odor from the foot and newer atrophy of the second toe as well as darkening of the forefoot. [name] denies fevers or chills, denies chest pain or palpitations, and denies any increase in his chronic dyspnea. He has not been attempting any interventions for these symptoms, and actually denies any pain from the foot.  Interim history S/p aortogram and transmetatarsal amputation (R). PT c/s. Discontinued antibiotics. Patient had seizure on 01/02/2017 possibly due to hyponatremia.  Assessment & Plan   Acute seizure -On 01/02/2017,patient witnessed having a seizure -Suspect secondary to hyponatremia. Patient dialyzed on 01/02/17, sodium noted to be 135 in the afternoon, however dropped to 126 on the morning on 6/3. Patient's sodium at baseline normally around 130.  -Patient was moved to stepdown for closer monitoring -Loaded with valproate (56m/kg), with continued dose of 58mkg TID- will transition patient to depakote 50015mID -Currently AAOx3. However does not remember the events that occurred on 6/3. -CT head obtained and was unremarkable -Discussed with Dr.  LinCheral Markereurology, no further recommendations, agrees with the above management -Sodium currently 129 -Normally, patients are not treated for the 1st episode of seizure, however, given that this patient is an ESRD patient on HD, patient's sodium will continue to fluctuate, therefore, he is at increased risk of having future seizures   Diabetic foot ulcers with gangrene/cellulitis -Affecting the right foot first and second metatarsals -X-rays negative for osteomyelitis -Patient currently afebrile with no leukocytosis however has been on vancomycin for the past week with hemodialysis -ESR 102, CRP 15.6 -Vascular surgery consulted and appreciated, s/p aortogram with runoff, intervention (see complete list under procedures) and s/p metatarsal amputation 01/01/2017 -Was placed on aztreonam, vancomycin, Flagyl (patient has penicillin allergy)- discontinued antibiotics as patient is now s/p transmetatarsal amputation  -Continue plavix -ABI done on 12/31/2016: Right 2.4/1.54, left 1.04 -patient has wound vac in place until 01/08/2017 -PT consulted and recommended home health  -Patient will need to follow up with vascular in 2 weeks  End-stage renal disease on hemodialysis -Patient dialyzes Monday, Wednesday, Friday -Nephrology consultation appreciated -Discussed with Dr. PatPosey Prontoatient has been using his fistula, HD catheter may be removed -HD catheter removed on 12/30/2016  Diabetes mellitus, type II -Continue Lantus, insulin sliding scale CBG monitoring  Essential hypertension -Continue Coreg, ramipril  Anemia secondary to chronic disease -Hemoglobin currently 9.5, continue to monitor CBC  Constipation -Resolved with suppository and laxatives/stool softeners  -likely related to recent immobility and pain medications  Hyponatremia -Chronic -Currently 129 -Will continue to monitor BMP -Pending TSH (however, given acute illness, may not be beneficial)  DVT Prophylaxis  SCDs  Code  Status: Full  Family Communication: None at bedside  Disposition Plan: Admitted. Possibly discharge within 24-48hrs  Consultants Vascular surgery Nephrology Neurology via phone  Procedures  US guided cannulation of left common femoral artery;  Aortogram with right lower extremity runoff; Drug coated balloon angioplasty of right popliteal artery with 88m impact admiral; Atherectomy with 1.25 micro csi and pta with 376mballoon of right pt artery; Atherectomy with 1.25 micro csi and pta with 51m61malloon of right at artery and 2mm44mlloon of right dp artery; Moderate sedation with fentnyl and versed for 121 minutes; Removal of right IJ tunneled dialysis catheter  ABI  Antibiotics   Anti-infectives    Start     Dose/Rate Route Frequency Ordered Stop   01/01/17 1200  vancomycin (VANCOCIN) IVPB 1000 mg/200 mL premix  Status:  Discontinued     1,000 mg 200 mL/hr over 60 Minutes Intravenous Every M-W-F (Hemodialysis) 12/31/16 1038 01/02/17 1210   12/30/16 1200  vancomycin (VANCOCIN) 1,500 mg in sodium chloride 0.9 % 250 mL IVPB  Status:  Discontinued     1,500 mg 250 mL/hr over 60 Minutes Intravenous Every M-W-F (Hemodialysis) 12/28/16 2111 12/31/16 1038   12/28/16 2200  aztreonam (AZACTAM) 500 mg in dextrose 5 % 50 mL IVPB  Status:  Discontinued     500 mg 100 mL/hr over 30 Minutes Intravenous Every 12 hours 12/28/16 2111 01/02/17 1210   12/28/16 2100  metroNIDAZOLE (FLAGYL) IVPB 500 mg  Status:  Discontinued     500 mg 100 mL/hr over 60 Minutes Intravenous Every 8 hours 12/28/16 2050 01/02/17 1210   12/28/16 1815  ciprofloxacin (CIPRO) IVPB 400 mg  Status:  Discontinued     400 mg 200 mL/hr over 60 Minutes Intravenous Every 24 hours 12/28/16 1810 12/28/16 2050      Subjective:   Jacob [name] and examined today. Patient denies any current pain. Feels his pain is controlled. Denies chest pain, shortness breath, abdominal pain, nausea or vomiting, diarrhea. Was able to have several  bowel movements.  Objective:   Vitals:   01/04/17 1200 01/04/17 1230 01/04/17 1300 01/04/17 1332  BP: (!) 127/54 130/60 137/67 (!) 139/47  Pulse: 67 72 72 73  Resp: '15 16 14 16  ' Temp:    98.3 F (36.8 C)  TempSrc:    Oral  SpO2: 100% 100% 100% 100%  Weight:    132.5 kg (292 lb 1.8 oz)  Height:        Intake/Output Summary (Last 24 hours) at 01/04/17 1425 Last data filed at 01/04/17 1332  Gross per 24 hour  Intake              411 ml  Output             4000 ml  Net            -3589 ml   Filed Weights   01/02/17 1521 01/04/17 0855 01/04/17 1332  Weight: (!) 137.7 kg (303 lb 9.2 oz) (!) 136.5 kg (300 lb 14.9 oz) 132.5 kg (292 lb 1.8 oz)   Exam  General: Well developed, well nourished, NAD, appears stated age  HEENT: NCAT, mucous membranes moist.   Cardiovascular: S1 S2 auscultated, RRR, no murmurs appreciated  Respiratory: Clear to auscultation bilaterally with equal chest rise  Abdomen: Soft, nontender, nondistended, + bowel sounds  Extremities: warm dry without cyanosis clubbing. Trace LE edema. Right TMA with VAC in place  Neuro: AAOx3, nonfocal  Psych: appropriate    Data Reviewed: I have personally reviewed following labs and imaging studies  CBC:  Recent Labs Lab 12/28/16 1836 12/29/16 5784  12/31/16 0220 01/01/17 0352 01/01/17 1343 01/02/17 0253 01/03/17 0413 01/04/17 0204  WBC 8.1 8.2  < > 7.6 8.7  --  8.8 9.9 12.1*  NEUTROABS 5.6 5.2  --   --   --   --   --   --   --   HGB 10.4* 10.2*  < > 9.6* 9.4* 11.9* 9.7* 9.2* 9.5*  HCT 33.7* 33.9*  < > 31.6* 30.8* 35.0* 32.7* 30.3* 31.9*  MCV 93.4 94.2  < > 92.4 92.2  --  94.0 92.9 93.5  PLT 335 331  < > 363 357  --  357 379 384  < > = values in this interval not displayed. Basic Metabolic Panel:  Recent Labs Lab 12/29/16 0624  12/30/16 2040  01/01/17 0352 01/01/17 1343 01/02/17 0253 01/02/17 1354 01/03/17 0413 01/04/17 0204  NA 132*  < > 130*  < > 130* 135 126* 129* 128* 129*  K 3.2*  < >  3.6  < > 3.9 3.9 4.2 4.7 4.7 5.1  CL 92*  < > 94*  < > 94*  --  91* 94* 91* 93*  CO2 29  < > 24  < > 26  --  '25 25 26 25  ' GLUCOSE 190*  < > 138*  < > 151* 100* 132* 120* 106* 100*  BUN 25*  < > 34*  < > 28*  --  17 22* 28* 37*  CREATININE 6.48*  < > 8.97*  < > 7.16*  --  5.29* 6.07* 7.06* 8.29*  CALCIUM 8.2*  < > 8.0*  < > 8.1*  --  8.1* 8.3* 8.2* 7.9*  MG 1.9  --   --   --   --   --   --   --   --   --   PHOS  --   --  3.8  --   --   --   --   --   --   --   < > = values in this interval not displayed. GFR: Estimated Creatinine Clearance: 12.5 mL/min (A) (by C-G formula based on SCr of 8.29 mg/dL (H)). Liver Function Tests:  Recent Labs Lab 12/28/16 1836 12/30/16 2040  AST 18  --   ALT 5*  --   ALKPHOS 141*  --   BILITOT 0.4  --   PROT 9.0*  --   ALBUMIN 2.9* 2.4*   No results for input(s): LIPASE, AMYLASE in the last 168 hours. No results for input(s): AMMONIA in the last 168 hours. Coagulation Profile:  Recent Labs Lab 12/30/16 0226  INR 1.30   Cardiac Enzymes: No results for input(s): CKTOTAL, CKMB, CKMBINDEX, TROPONINI in the last 168 hours. BNP (last 3 results) No results for input(s): PROBNP in the last 8760 hours. HbA1C: No results for input(s): HGBA1C in the last 72 hours. CBG:  Recent Labs Lab 01/03/17 1623 01/03/17 1806 01/03/17 2145 01/04/17 0754 01/04/17 0837  GLUCAP 138* 136* 118* 56* 113*   Lipid Profile: No results for input(s): CHOL, HDL, LDLCALC, TRIG, CHOLHDL, LDLDIRECT in the last 72 hours. Thyroid Function Tests: No results for input(s): TSH, T4TOTAL, FREET4, T3FREE, THYROIDAB in the last 72 hours. Anemia Panel: No results for input(s): VITAMINB12, FOLATE, FERRITIN, TIBC, IRON, RETICCTPCT in the last 72 hours. Urine analysis: No results found for: COLORURINE, APPEARANCEUR, LABSPEC, PHURINE, GLUCOSEU, HGBUR, BILIRUBINUR, KETONESUR, PROTEINUR, UROBILINOGEN, NITRITE, LEUKOCYTESUR Sepsis  Labs: '@LABRCNTIP' (procalcitonin:4,lacticidven:4)  ) Recent  Results (from the past 240 hour(s))  MRSA PCR Screening     Status: Abnormal   Collection Time: 12/29/16 12:54 AM  Result Value Ref Range Status   MRSA by PCR POSITIVE (A) NEGATIVE Final    Comment:        The GeneXpert MRSA Assay (FDA approved for NASAL specimens only), is one component of a comprehensive MRSA colonization surveillance program. It is not intended to diagnose MRSA infection nor to guide or monitor treatment for MRSA infections. RESULT CALLED TO, READ BACK BY AND VERIFIED WITH: BULLINS,M @ 833 BY MATTHEWS,B       Radiology Studies: Ct Head Wo Contrast  Result Date: 01/02/2017 CLINICAL DATA:  Patient with syncopal episode. EXAM: CT HEAD WITHOUT CONTRAST TECHNIQUE: Contiguous axial images were obtained from the base of the skull through the vertex without intravenous contrast. COMPARISON:  Brain CT 04/05/2016 FINDINGS: Brain: Ventricles and sulci are appropriate for patient's age. No evidence for acute cortically based infarct, intracranial hemorrhage, mass lesion or mass-effect. Periventricular and subcortical white matter hypodensity compatible with chronic microvascular ischemic changes. Vascular: Internal carotid arterial vascular calcifications. Skull:  Intact. Sinuses/Orbits: Minimal fluid within the mastoid air cells bilaterally. Paranasal sinuses are well aerated. Orbits are unremarkable. Other: None. IMPRESSION: No acute intracranial process. Atrophy and chronic microvascular ischemic changes. Electronically Signed   By: Lovey Newcomer M.D.   On: 01/02/2017 17:41     Scheduled Meds: . carvedilol  6.25 mg Oral BID WC  . cinacalcet  30 mg Oral Q breakfast  . clopidogrel  75 mg Oral Q breakfast  . darbepoetin (ARANESP) injection - DIALYSIS  100 mcg Intravenous Q Mon-HD  . divalproex  500 mg Oral Q12H  . docusate sodium  200 mg Oral BID  . insulin aspart  0-15 Units Subcutaneous TID WC  . insulin aspart   0-5 Units Subcutaneous QHS  . insulin glargine  10 Units Subcutaneous Daily  . multivitamin  1 tablet Oral Q M,W,F  . pantoprazole  40 mg Oral Daily  . ramipril  10 mg Oral Daily  . sevelamer carbonate  2,400 mg Oral TID WC   Continuous Infusions:    LOS: 7 days   Time Spent in minutes   30 minutes  Roquel Burgin D.O. on 01/04/2017 at 2:25 PM  Between 7am to 7pm - Pager - [phone]  After 7pm go to [email or web address] - password [email or web address]  And look for the night coverage person covering for me after hours  [facility] Office  [phone]

## 2017-01-05 ENCOUNTER — Inpatient Hospital Stay (HOSPITAL_COMMUNITY): Payer: Medicare Other

## 2017-01-05 DIAGNOSIS — S98139A Complete traumatic amputation of one unspecified lesser toe, initial encounter: Secondary | ICD-10-CM | POA: Diagnosis not present

## 2017-01-05 DIAGNOSIS — Z992 Dependence on renal dialysis: Secondary | ICD-10-CM | POA: Diagnosis not present

## 2017-01-05 DIAGNOSIS — D509 Iron deficiency anemia, unspecified: Secondary | ICD-10-CM | POA: Diagnosis not present

## 2017-01-05 DIAGNOSIS — R278 Other lack of coordination: Secondary | ICD-10-CM | POA: Diagnosis not present

## 2017-01-05 DIAGNOSIS — I739 Peripheral vascular disease, unspecified: Secondary | ICD-10-CM | POA: Diagnosis not present

## 2017-01-05 DIAGNOSIS — E119 Type 2 diabetes mellitus without complications: Secondary | ICD-10-CM | POA: Diagnosis not present

## 2017-01-05 DIAGNOSIS — E876 Hypokalemia: Secondary | ICD-10-CM | POA: Diagnosis not present

## 2017-01-05 DIAGNOSIS — G4089 Other seizures: Secondary | ICD-10-CM | POA: Diagnosis not present

## 2017-01-05 DIAGNOSIS — Z89431 Acquired absence of right foot: Secondary | ICD-10-CM | POA: Diagnosis not present

## 2017-01-05 DIAGNOSIS — N493 Fournier gangrene: Secondary | ICD-10-CM | POA: Diagnosis not present

## 2017-01-05 DIAGNOSIS — E11618 Type 2 diabetes mellitus with other diabetic arthropathy: Secondary | ICD-10-CM | POA: Diagnosis not present

## 2017-01-05 DIAGNOSIS — E875 Hyperkalemia: Secondary | ICD-10-CM

## 2017-01-05 DIAGNOSIS — R4589 Other symptoms and signs involving emotional state: Secondary | ICD-10-CM | POA: Diagnosis not present

## 2017-01-05 DIAGNOSIS — K5900 Constipation, unspecified: Secondary | ICD-10-CM | POA: Diagnosis not present

## 2017-01-05 DIAGNOSIS — I428 Other cardiomyopathies: Secondary | ICD-10-CM | POA: Diagnosis not present

## 2017-01-05 DIAGNOSIS — E1121 Type 2 diabetes mellitus with diabetic nephropathy: Secondary | ICD-10-CM | POA: Diagnosis not present

## 2017-01-05 DIAGNOSIS — N185 Chronic kidney disease, stage 5: Secondary | ICD-10-CM | POA: Diagnosis not present

## 2017-01-05 DIAGNOSIS — D638 Anemia in other chronic diseases classified elsewhere: Secondary | ICD-10-CM | POA: Diagnosis not present

## 2017-01-05 DIAGNOSIS — T8131XA Disruption of external operation (surgical) wound, not elsewhere classified, initial encounter: Secondary | ICD-10-CM | POA: Diagnosis not present

## 2017-01-05 DIAGNOSIS — E782 Mixed hyperlipidemia: Secondary | ICD-10-CM | POA: Diagnosis not present

## 2017-01-05 DIAGNOSIS — L03115 Cellulitis of right lower limb: Secondary | ICD-10-CM | POA: Diagnosis not present

## 2017-01-05 DIAGNOSIS — E871 Hypo-osmolality and hyponatremia: Secondary | ICD-10-CM | POA: Diagnosis not present

## 2017-01-05 DIAGNOSIS — I96 Gangrene, not elsewhere classified: Secondary | ICD-10-CM | POA: Diagnosis not present

## 2017-01-05 DIAGNOSIS — S98119A Complete traumatic amputation of unspecified great toe, initial encounter: Secondary | ICD-10-CM | POA: Diagnosis not present

## 2017-01-05 DIAGNOSIS — R569 Unspecified convulsions: Secondary | ICD-10-CM | POA: Diagnosis not present

## 2017-01-05 DIAGNOSIS — D631 Anemia in chronic kidney disease: Secondary | ICD-10-CM | POA: Diagnosis not present

## 2017-01-05 DIAGNOSIS — I120 Hypertensive chronic kidney disease with stage 5 chronic kidney disease or end stage renal disease: Secondary | ICD-10-CM | POA: Diagnosis not present

## 2017-01-05 DIAGNOSIS — N186 End stage renal disease: Secondary | ICD-10-CM | POA: Diagnosis not present

## 2017-01-05 DIAGNOSIS — E1152 Type 2 diabetes mellitus with diabetic peripheral angiopathy with gangrene: Secondary | ICD-10-CM | POA: Diagnosis not present

## 2017-01-05 DIAGNOSIS — R269 Unspecified abnormalities of gait and mobility: Secondary | ICD-10-CM | POA: Diagnosis not present

## 2017-01-05 DIAGNOSIS — R2681 Unsteadiness on feet: Secondary | ICD-10-CM | POA: Diagnosis not present

## 2017-01-05 DIAGNOSIS — Z794 Long term (current) use of insulin: Secondary | ICD-10-CM | POA: Diagnosis not present

## 2017-01-05 DIAGNOSIS — I10 Essential (primary) hypertension: Secondary | ICD-10-CM | POA: Diagnosis not present

## 2017-01-05 DIAGNOSIS — Z8631 Personal history of diabetic foot ulcer: Secondary | ICD-10-CM | POA: Diagnosis not present

## 2017-01-05 DIAGNOSIS — Z89432 Acquired absence of left foot: Secondary | ICD-10-CM | POA: Diagnosis not present

## 2017-01-05 DIAGNOSIS — N2581 Secondary hyperparathyroidism of renal origin: Secondary | ICD-10-CM | POA: Diagnosis not present

## 2017-01-05 DIAGNOSIS — M6281 Muscle weakness (generalized): Secondary | ICD-10-CM | POA: Diagnosis not present

## 2017-01-05 LAB — RENAL FUNCTION PANEL
Albumin: 2.2 g/dL — ABNORMAL LOW (ref 3.5–5.0)
Anion gap: 11 (ref 5–15)
BUN: 24 mg/dL — ABNORMAL HIGH (ref 6–20)
CHLORIDE: 92 mmol/L — AB (ref 101–111)
CO2: 26 mmol/L (ref 22–32)
Calcium: 8 mg/dL — ABNORMAL LOW (ref 8.9–10.3)
Creatinine, Ser: 6.71 mg/dL — ABNORMAL HIGH (ref 0.61–1.24)
GFR calc Af Amer: 9 mL/min — ABNORMAL LOW (ref >60)
GFR calc non Af Amer: 8 mL/min — ABNORMAL LOW (ref >60)
GLUCOSE: 108 mg/dL — AB (ref 65–99)
POTASSIUM: 4 mmol/L (ref 3.5–5.1)
Phosphorus: 3.5 mg/dL (ref 2.5–4.6)
Sodium: 129 mmol/L — ABNORMAL LOW (ref 135–145)

## 2017-01-05 LAB — GLUCOSE, CAPILLARY: Glucose-Capillary: 123 mg/dL — ABNORMAL HIGH (ref 65–99)

## 2017-01-05 NOTE — Care Management Note (Signed)
Case Management Note  Patient Details  Name: SLYVESTER LATONA MRN: [id number] Date of Birth: Dec 13, 1950  Subjective/Objective:    POD 2 right TMA, has prevena incisional wound vac on , need to keep on til 6/8 per MD note.  Pt eval will see patient today and make recommendations,  Patient lives alone, sister helps him out and his ex wife.  He states his sister gets off work at 6 pm today and she has the key to let him in the house. Per pt recs SNF, CSW referral.    6/5 1209 Tomi Bamberger RN, BSN- patient for dc to SNF today, CSW facilitating.                            Action/Plan:   Expected Discharge Date:  01/05/17               Expected Discharge Plan:  Skilled Nursing Facility  In-House Referral:  Clinical Social Work  Discharge planning Services  CM Consult  Post Acute Care Choice:    Choice offered to:     DME Arranged:    DME Agency:     HH Arranged:    [place] Agency:     Status of Service:  Completed, signed off  If discussed at [employer], dates discussed:    Additional Comments:  Zenon Mayo, RN 01/05/2017, 12:09 PM

## 2017-01-05 NOTE — Procedures (Signed)
ELECTROENCEPHALOGRAM REPORT  Date of Study: 01/05/2017  Patient's Name: Jacob [name] MRN: [id number] Date of Birth: 02/22/1951  Referring Provider: Cristal Ford, DO  Clinical History: 66 year old man with hypertension, diabetes and ESRD on HD with witness seizure in setting of hyponatremia.  Medications: acetaminophen (TYLENOL) tablet 650 mg   bisacodyl (DULCOLAX) EC tablet 10 mg  carvedilol (COREG) tablet 6.25 mg  cinacalcet (SENSIPAR) tablet 30 mg  clopidogrel (PLAVIX) tablet 75 mg  Darbepoetin Alfa (ARANESP) injection 100 mcg  divalproex (DEPAKOTE) DR tablet 500 mg  docusate sodium (COLACE) capsule 200 mg  HYDROcodone-acetaminophen insulin aspart (novoLOG) insulin glargine (LANTUS) injection 10 Units  loratadine (CLARITIN) tablet 10 mg  LORazepam (ATIVAN) injection 1 mg  morphine 4 MG/ML injection 2 mg  multivitamin (RENA-VIT) tablet 1 tablet  L1ondansetron (ZOFRAN) oxyCODONE-acetaminophen pantoprazole (PROTONIX) EC tablet 40 mg  ramipril (ALTACE) capsule 10 mg  sevelamer carbonate (RENVELA) tablet 2,400 mg   Technical Summary: A multichannel digital EEG recording measured by the international 10-20 system with electrodes applied with paste and impedances below 5000 ohms performed in our laboratory with EKG monitoring in an awake and drowsy patient.  Hyperventilation was not performed.  Photic stimulation was performed.  The digital EEG was referentially recorded, reformatted, and digitally filtered in a variety of bipolar and referential montages for optimal display.    Description: The patient is awake and drowsy during the recording.  During maximal wakefulness, there is a symmetric, medium voltage 8 Hz posterior dominant rhythm that attenuates with eye opening.  The record is symmetric.  During drowsiness, there is an increase in theta slowing of the background.  Stage 2 sleep was not seen.  Photic stimulation did not elicit any abnormalities.  There were no  epileptiform discharges or electrographic seizures seen.    EKG lead was unremarkable.  Impression: This awake and drowsy EEG is normal.    Clinical Correlation: A normal EEG does not exclude a clinical diagnosis of epilepsy.  If further clinical questions remain, prolonged EEG may be helpful.  Clinical correlation is advised.   Metta Clines, DO

## 2017-01-05 NOTE — Clinical Social Work Placement (Signed)
   CLINICAL SOCIAL WORK PLACEMENT  NOTE  Date:  01/05/2017  Patient Details  Name: ESMERALDA BLANFORD MRN: [id number] Date of Birth: 1951/04/03  Clinical Social Work is seeking post-discharge placement for this patient at the [place] level of care (*CSW will initial, date and re-position this form in  chart as items are completed):  Yes   Patient/family provided with [place] Work Department's list of facilities offering this level of care within the geographic area requested by the patient (or if unable, by the patient's family).  Yes   Patient/family informed of their freedom to choose among providers that offer the needed level of care, that participate in Medicare, Medicaid or managed care program needed by the patient, have an available bed and are willing to accept the patient.  Yes   Patient/family informed of [facility]'s ownership interest in [facility] and [facility], as well as of the fact that they are under no obligation to receive care at these facilities.  PASRR submitted to EDS on 01/04/17     PASRR number received on 01/04/17     Existing PASRR number confirmed on       FL2 transmitted to all facilities in geographic area requested by pt/family on       FL2 transmitted to all facilities within larger geographic area on       Patient informed that his/her managed care company has contracts with or will negotiate with certain facilities, including the following:        Yes   Patient/family informed of bed offers received.  Patient chooses bed at [facility]     Physician recommends and patient chooses bed at      Patient to be transferred [facility] on 01[facility]05[facility]17.  Patient to be transferred to facility by ptar     Patient family notified on 01/05/17 of transfer.  Name of family member notified:        PHYSICIAN Please sign FL2     Additional Comment:     _______________________________________________ Jorge Ny, LCSW 01/05/2017, 10:50 AM

## 2017-01-05 NOTE — Progress Notes (Signed)
Bedside EEG completed, results pending. 

## 2017-01-05 NOTE — Progress Notes (Signed)
Patient will discharge to [facility] Anticipated discharge date: 6/5 Family notified: pt has notified family Transportation by [employer]- scheduled for 11:30am  CSW signing off.  Jorge Ny, LCSW Clinical Social Worker [phone]

## 2017-01-05 NOTE — Clinical Social Work Note (Signed)
Clinical Social Work Assessment  Patient Details  Name: Jacob [name] MRN: [id number] Date of Birth: 04-23-1951  Date of referral:  01/05/17               Reason for consult:  Facility Placement                Permission sought to share information with:  Chartered certified accountant granted to share information::  Yes, Verbal Permission Granted  Name::     Emergency planning/management officer::     Relationship::  SNF  Contact Information:     Housing/Transportation Living arrangements for the past 2 months:  Apartment Source of Information:  Patient Patient Interpreter Needed:  None Criminal Activity/Legal Involvement Pertinent to Current Situation/Hospitalization:  No - Comment as needed Significant Relationships:  Siblings Lives with:  Self Do you feel safe going back to the place where you live?  No Need for family participation in patient care:  No (Coment)  Care giving concerns:  Pt lives at home alone- currently with high level of impairment following TMA and no physical support for home.   Social Worker assessment / plan:  CSW spoke with pt about PT recommendation for SNF- pt interested in [facility] which is near his home.  Employment status:  Retired Forensic scientist:  Medicare PT Recommendations:  [place] / Referral to community resources:  [place]  Patient/Family's Response to care:  Agreeable to SNF and hopeful for placement near home.  Patient/Family's Understanding of and Emotional Response to Diagnosis, Current Treatment, and Prognosis:  Unclear level of understanding at this time but pt is agreeable to medical recommendations and hopeful for return to baseline mobility with PT.  Emotional Assessment Appearance:  Appears stated age Attitude/Demeanor/Rapport:    Affect (typically observed):  Appropriate Orientation:  Oriented to Self, Oriented to Place, Oriented to  Time, Oriented to Situation Alcohol /  Substance use:  Not Applicable Psych involvement (Current and /or in the community):  No (Comment)  Discharge Needs  Concerns to be addressed:  Care Coordination Readmission within the last 30 days:    Current discharge risk:  Physical Impairment Barriers to Discharge:  Continued Medical Work up   [employer], LCSW 01/05/2017, 10:18 AM

## 2017-01-05 NOTE — Care Management Important Message (Signed)
Important Message  Patient Details  Name: Jacob [name] MRN: [id number] Date of Birth: 05-21-1951   Medicare Important Message Given:  Yes    Zenon Mayo, RN 01/05/2017, 12:08 [place] Message  Patient Details  Name: Jacob [name] MRN: [id number] Date of Birth: [birth year]   Medicare Important Message Given:  Yes    Zenon Mayo, RN 01/05/2017, 12:08 PM

## 2017-01-07 DIAGNOSIS — Z992 Dependence on renal dialysis: Secondary | ICD-10-CM | POA: Diagnosis not present

## 2017-01-07 DIAGNOSIS — D631 Anemia in chronic kidney disease: Secondary | ICD-10-CM | POA: Diagnosis not present

## 2017-01-07 DIAGNOSIS — N2581 Secondary hyperparathyroidism of renal origin: Secondary | ICD-10-CM | POA: Diagnosis not present

## 2017-01-07 DIAGNOSIS — N186 End stage renal disease: Secondary | ICD-10-CM | POA: Diagnosis not present

## 2017-01-07 DIAGNOSIS — D509 Iron deficiency anemia, unspecified: Secondary | ICD-10-CM | POA: Diagnosis not present

## 2017-01-08 DIAGNOSIS — Z8631 Personal history of diabetic foot ulcer: Secondary | ICD-10-CM | POA: Diagnosis not present

## 2017-01-08 DIAGNOSIS — I10 Essential (primary) hypertension: Secondary | ICD-10-CM | POA: Diagnosis not present

## 2017-01-08 DIAGNOSIS — D638 Anemia in other chronic diseases classified elsewhere: Secondary | ICD-10-CM | POA: Diagnosis not present

## 2017-01-08 DIAGNOSIS — E1121 Type 2 diabetes mellitus with diabetic nephropathy: Secondary | ICD-10-CM | POA: Diagnosis not present

## 2017-01-08 DIAGNOSIS — N186 End stage renal disease: Secondary | ICD-10-CM | POA: Diagnosis not present

## 2017-01-09 DIAGNOSIS — Z992 Dependence on renal dialysis: Secondary | ICD-10-CM | POA: Diagnosis not present

## 2017-01-09 DIAGNOSIS — N2581 Secondary hyperparathyroidism of renal origin: Secondary | ICD-10-CM | POA: Diagnosis not present

## 2017-01-09 DIAGNOSIS — D509 Iron deficiency anemia, unspecified: Secondary | ICD-10-CM | POA: Diagnosis not present

## 2017-01-09 DIAGNOSIS — D631 Anemia in chronic kidney disease: Secondary | ICD-10-CM | POA: Diagnosis not present

## 2017-01-09 DIAGNOSIS — N186 End stage renal disease: Secondary | ICD-10-CM | POA: Diagnosis not present

## 2017-01-11 ENCOUNTER — Encounter: Payer: Self-pay | Admitting: Surgery

## 2017-01-11 DIAGNOSIS — Z89431 Acquired absence of right foot: Secondary | ICD-10-CM | POA: Diagnosis not present

## 2017-01-12 DIAGNOSIS — N186 End stage renal disease: Secondary | ICD-10-CM | POA: Diagnosis not present

## 2017-01-12 DIAGNOSIS — D631 Anemia in chronic kidney disease: Secondary | ICD-10-CM | POA: Diagnosis not present

## 2017-01-12 DIAGNOSIS — Z992 Dependence on renal dialysis: Secondary | ICD-10-CM | POA: Diagnosis not present

## 2017-01-12 DIAGNOSIS — D509 Iron deficiency anemia, unspecified: Secondary | ICD-10-CM | POA: Diagnosis not present

## 2017-01-12 DIAGNOSIS — N2581 Secondary hyperparathyroidism of renal origin: Secondary | ICD-10-CM | POA: Diagnosis not present

## 2017-01-14 DIAGNOSIS — E1121 Type 2 diabetes mellitus with diabetic nephropathy: Secondary | ICD-10-CM | POA: Diagnosis not present

## 2017-01-14 DIAGNOSIS — Z89431 Acquired absence of right foot: Secondary | ICD-10-CM | POA: Diagnosis not present

## 2017-01-14 DIAGNOSIS — Z8631 Personal history of diabetic foot ulcer: Secondary | ICD-10-CM | POA: Diagnosis not present

## 2017-01-14 DIAGNOSIS — Z992 Dependence on renal dialysis: Secondary | ICD-10-CM | POA: Diagnosis not present

## 2017-01-14 DIAGNOSIS — D638 Anemia in other chronic diseases classified elsewhere: Secondary | ICD-10-CM | POA: Diagnosis not present

## 2017-01-14 DIAGNOSIS — D509 Iron deficiency anemia, unspecified: Secondary | ICD-10-CM | POA: Diagnosis not present

## 2017-01-14 DIAGNOSIS — E871 Hypo-osmolality and hyponatremia: Secondary | ICD-10-CM | POA: Diagnosis not present

## 2017-01-14 DIAGNOSIS — Z794 Long term (current) use of insulin: Secondary | ICD-10-CM | POA: Diagnosis not present

## 2017-01-14 DIAGNOSIS — N2581 Secondary hyperparathyroidism of renal origin: Secondary | ICD-10-CM | POA: Diagnosis not present

## 2017-01-14 DIAGNOSIS — I10 Essential (primary) hypertension: Secondary | ICD-10-CM | POA: Diagnosis not present

## 2017-01-14 DIAGNOSIS — D631 Anemia in chronic kidney disease: Secondary | ICD-10-CM | POA: Diagnosis not present

## 2017-01-14 DIAGNOSIS — N186 End stage renal disease: Secondary | ICD-10-CM | POA: Diagnosis not present

## 2017-01-16 DIAGNOSIS — N186 End stage renal disease: Secondary | ICD-10-CM | POA: Diagnosis not present

## 2017-01-16 DIAGNOSIS — Z992 Dependence on renal dialysis: Secondary | ICD-10-CM | POA: Diagnosis not present

## 2017-01-16 DIAGNOSIS — D509 Iron deficiency anemia, unspecified: Secondary | ICD-10-CM | POA: Diagnosis not present

## 2017-01-16 DIAGNOSIS — N2581 Secondary hyperparathyroidism of renal origin: Secondary | ICD-10-CM | POA: Diagnosis not present

## 2017-01-16 DIAGNOSIS — D631 Anemia in chronic kidney disease: Secondary | ICD-10-CM | POA: Diagnosis not present

## 2017-01-18 ENCOUNTER — Encounter: Payer: Self-pay | Admitting: Surgery

## 2017-01-18 ENCOUNTER — Ambulatory Visit (INDEPENDENT_AMBULATORY_CARE_PROVIDER_SITE_OTHER): Payer: Self-pay | Admitting: Surgery

## 2017-01-18 VITALS — BP 181/84 | HR 70 | Temp 98.3°F | Resp 20 | Ht 74.0 in | Wt 292.0 lb

## 2017-01-18 DIAGNOSIS — Z89431 Acquired absence of right foot: Secondary | ICD-10-CM | POA: Diagnosis not present

## 2017-01-18 NOTE — Progress Notes (Signed)
POST OPERATIVE OFFICE NOTE    CC:  F/u for surgery  HPI:  This is a 66 y.o. male who is s/p right transmetatarsal amputation on 01/01/17 by Dr. Trula Slade.  When his wound was inspected on Friday, it was not open.  On inspection today, there was a dehiscence of the wound.  Dressing was changed to silver alginate.  He is brought in today for wound check.    He denies any pain with his foot and only takes a pain pill occasionally.    Allergies  Allergen Reactions  . Bee Venom Anaphylaxis  . Penicillins Swelling and Other (See Comments)    SWELLING REACTION UNSPECIFIED   Has patient had a PCN reaction causing immediate rash, facial/tongue/throat swelling, SOB or lightheadedness with hypotension: No Has patient had a PCN reaction causing severe rash involving mucus membranes or skin necrosis: No Has patient had a PCN reaction that required hospitalization No Has patient had a PCN reaction occurring within the last 10 years: No If all of the above answers are "NO", then may proceed with Cephalosporin use.    Current Outpatient Prescriptions  Medication Sig Dispense Refill  . acetaminophen (TYLENOL) 500 MG tablet Take 1,000 mg by mouth every 6 (six) hours as needed for mild pain.    [name] aspirin EC 325 MG tablet Take 325 mg by mouth daily.    [name] atorvastatin (LIPITOR) 40 MG tablet Take 40 mg by mouth daily.    . B Complex-C-Folic Acid (DIALYVITE TABLET) TABS Take 1 tablet by mouth every Monday, Wednesday, and Friday.     . bisacodyl (DULCOLAX) 5 MG EC tablet Take 2 tablets (10 mg total) by mouth daily as needed for moderate constipation. 30 tablet 0  . carvedilol (COREG) 6.25 MG tablet Take 6.25 mg by mouth 2 (two) times daily with a meal. Take 6.5mg s once daily on dialysis days, Mondays, Wednesdays, and Fridays    . cinacalcet (SENSIPAR) 30 MG tablet Take 30 mg by mouth daily.    . clopidogrel (PLAVIX) 75 MG tablet Take 1 tablet (75 mg total) by mouth daily with breakfast. 30 tablet 0  .  dextromethorphan (ROBITUSSIN 12 HOUR COUGH) 30 MG/5ML liquid Take by mouth as needed for cough.    . divalproex (DEPAKOTE) 500 MG DR tablet Take 1 tablet (500 mg total) by mouth every 12 (twelve) hours. 60 tablet 0  . EPIPEN 2-PAK 0.3 MG/0.3ML SOAJ injection Inject 0.3 mg into the muscle once as needed (For anaphylaxis.).     [name] LANTUS SOLOSTAR 100 UNIT/ML Solostar Pen Inject 20 Units into the skin at bedtime. If blood sugar is lower than 150 only inject 20 units at bedtime    . lisinopril (PRINIVIL,ZESTRIL) 20 MG tablet Take 20 mg by mouth daily.    [name] loratadine (CLARITIN) 10 MG tablet Take 10 mg by mouth daily as needed for allergies.    [name] omeprazole (PRILOSEC) 40 MG capsule Take 40 mg by mouth daily.     . ramipril (ALTACE) 10 MG capsule Take 10 mg by mouth daily.     [name] RENVELA 800 MG tablet Take 800-2,400 mg by mouth 3 (three) times daily with meals. Take 2400 mg by mouth 3 times daily with meals and 800 mg by mouth with snacks    . oxyCODONE-acetaminophen (PERCOCET/ROXICET) 5-325 MG tablet Take 1 tablet by mouth every 4 (four) hours as needed for moderate pain. (Patient not taking: Reported on 01/18/2017) 15 tablet 0   No current facility-administered medications for this  visit.      ROS:  See HPI  Physical Exam:  Vitals:   01/18/17 1418  BP: (!) 181/84  Pulse: 70  Resp: 20  Temp: 98.3 F (36.8 C)    Incision:  Entire incision is dehisced ~2.5cm wide; there is fibrinous tissue present with minimal granulation tissue.  Nylon sutures are in place.  Extremities:  Mild swelling RLE   Assessment/Plan:  This is a 66 y.o. male who is s/p: Right TMA 01/01/17  -pt's Pravena vac was removed on 01/08/17.  His wound had been healing but dehisced over the weekend.  No evidence of infection.  Silver alginate was started by wound care at the [facility].   Also, elevation of right leg to help minimize swelling.   Will continue this regimen and have pt f/u with Dr. Trula Slade in 3 weeks to check the  wound.  At that time, we will remove the nylon sutures.     Leontine Locket, PA-C [facility] [phone]  Clinic MD:  Pt seen and examined with Dr. Trula Slade   I agree with the above.  I have seen and evaluated the patient.  We will do our best to try to salvage this transmetatarsal amputation.  He will continue with local wound [facility] at [facility].  He is scheduled to go home soon.  He will follow up in 3 weeks for wound check.  At that time we will remove his nylon sutures.  Annamarie Major

## 2017-01-19 DIAGNOSIS — Z992 Dependence on renal dialysis: Secondary | ICD-10-CM | POA: Diagnosis not present

## 2017-01-19 DIAGNOSIS — D631 Anemia in chronic kidney disease: Secondary | ICD-10-CM | POA: Diagnosis not present

## 2017-01-19 DIAGNOSIS — N2581 Secondary hyperparathyroidism of renal origin: Secondary | ICD-10-CM | POA: Diagnosis not present

## 2017-01-19 DIAGNOSIS — D509 Iron deficiency anemia, unspecified: Secondary | ICD-10-CM | POA: Diagnosis not present

## 2017-01-19 DIAGNOSIS — N186 End stage renal disease: Secondary | ICD-10-CM | POA: Diagnosis not present

## 2017-01-21 DIAGNOSIS — E871 Hypo-osmolality and hyponatremia: Secondary | ICD-10-CM | POA: Diagnosis not present

## 2017-01-21 DIAGNOSIS — G4089 Other seizures: Secondary | ICD-10-CM | POA: Diagnosis not present

## 2017-01-21 DIAGNOSIS — N2581 Secondary hyperparathyroidism of renal origin: Secondary | ICD-10-CM | POA: Diagnosis not present

## 2017-01-21 DIAGNOSIS — Z992 Dependence on renal dialysis: Secondary | ICD-10-CM | POA: Diagnosis not present

## 2017-01-21 DIAGNOSIS — N186 End stage renal disease: Secondary | ICD-10-CM | POA: Diagnosis not present

## 2017-01-21 DIAGNOSIS — E1121 Type 2 diabetes mellitus with diabetic nephropathy: Secondary | ICD-10-CM | POA: Diagnosis not present

## 2017-01-21 DIAGNOSIS — Z89431 Acquired absence of right foot: Secondary | ICD-10-CM | POA: Diagnosis not present

## 2017-01-21 DIAGNOSIS — D638 Anemia in other chronic diseases classified elsewhere: Secondary | ICD-10-CM | POA: Diagnosis not present

## 2017-01-21 DIAGNOSIS — D631 Anemia in chronic kidney disease: Secondary | ICD-10-CM | POA: Diagnosis not present

## 2017-01-21 DIAGNOSIS — D509 Iron deficiency anemia, unspecified: Secondary | ICD-10-CM | POA: Diagnosis not present

## 2017-01-21 DIAGNOSIS — I10 Essential (primary) hypertension: Secondary | ICD-10-CM | POA: Diagnosis not present

## 2017-01-23 DIAGNOSIS — D631 Anemia in chronic kidney disease: Secondary | ICD-10-CM | POA: Diagnosis not present

## 2017-01-23 DIAGNOSIS — N2581 Secondary hyperparathyroidism of renal origin: Secondary | ICD-10-CM | POA: Diagnosis not present

## 2017-01-23 DIAGNOSIS — N186 End stage renal disease: Secondary | ICD-10-CM | POA: Diagnosis not present

## 2017-01-23 DIAGNOSIS — D509 Iron deficiency anemia, unspecified: Secondary | ICD-10-CM | POA: Diagnosis not present

## 2017-01-23 DIAGNOSIS — Z992 Dependence on renal dialysis: Secondary | ICD-10-CM | POA: Diagnosis not present

## 2017-01-25 DIAGNOSIS — T8131XA Disruption of external operation (surgical) wound, not elsewhere classified, initial encounter: Secondary | ICD-10-CM | POA: Diagnosis not present

## 2017-01-25 DIAGNOSIS — Z89432 Acquired absence of left foot: Secondary | ICD-10-CM | POA: Diagnosis not present

## 2017-01-26 DIAGNOSIS — Z992 Dependence on renal dialysis: Secondary | ICD-10-CM | POA: Diagnosis not present

## 2017-01-26 DIAGNOSIS — N2581 Secondary hyperparathyroidism of renal origin: Secondary | ICD-10-CM | POA: Diagnosis not present

## 2017-01-26 DIAGNOSIS — D631 Anemia in chronic kidney disease: Secondary | ICD-10-CM | POA: Diagnosis not present

## 2017-01-26 DIAGNOSIS — N186 End stage renal disease: Secondary | ICD-10-CM | POA: Diagnosis not present

## 2017-01-26 DIAGNOSIS — D509 Iron deficiency anemia, unspecified: Secondary | ICD-10-CM | POA: Diagnosis not present

## 2017-01-27 DIAGNOSIS — R4589 Other symptoms and signs involving emotional state: Secondary | ICD-10-CM | POA: Diagnosis not present

## 2017-01-27 DIAGNOSIS — D638 Anemia in other chronic diseases classified elsewhere: Secondary | ICD-10-CM | POA: Diagnosis not present

## 2017-01-27 DIAGNOSIS — N186 End stage renal disease: Secondary | ICD-10-CM | POA: Diagnosis not present

## 2017-01-27 DIAGNOSIS — E1121 Type 2 diabetes mellitus with diabetic nephropathy: Secondary | ICD-10-CM | POA: Diagnosis not present

## 2017-01-27 DIAGNOSIS — I10 Essential (primary) hypertension: Secondary | ICD-10-CM | POA: Diagnosis not present

## 2017-01-28 ENCOUNTER — Other Ambulatory Visit: Payer: Self-pay | Admitting: *Deleted

## 2017-01-28 DIAGNOSIS — Z992 Dependence on renal dialysis: Secondary | ICD-10-CM | POA: Diagnosis not present

## 2017-01-28 DIAGNOSIS — N186 End stage renal disease: Secondary | ICD-10-CM | POA: Diagnosis not present

## 2017-01-28 DIAGNOSIS — N2581 Secondary hyperparathyroidism of renal origin: Secondary | ICD-10-CM | POA: Diagnosis not present

## 2017-01-28 DIAGNOSIS — D509 Iron deficiency anemia, unspecified: Secondary | ICD-10-CM | POA: Diagnosis not present

## 2017-01-28 DIAGNOSIS — D631 Anemia in chronic kidney disease: Secondary | ICD-10-CM | POA: Diagnosis not present

## 2017-01-28 NOTE — Patient Outreach (Signed)
[facility]) Care Management  01/28/2017  LAURIS SERVISS [birth year] [id number]   Telephone Screen  Referral Date: 01/22/17 Referral Source: MD referral (Dr. Sharilyn Sites) Referral Reason: Long term planning to start before discharge Insurance: Medicare, [place]  Phone called placed to [facility] in [place], spoke with Web designer. Patient was discharged to the [facility] after his most recent hospitalization.   Plan: RN CM will make referral to [facility] for follow-up from hospital discharge to [facility].   [place] Bells, RN, BSN, MHA/MSL, [place] Telephonic Care Manager Coordinator [facility] Direct Phone: [phone] Toll Free: [phone] Fax: [phone]

## 2017-01-29 ENCOUNTER — Encounter: Payer: Self-pay | Admitting: Licensed Clinical Social Worker

## 2017-01-29 ENCOUNTER — Other Ambulatory Visit: Payer: Self-pay | Admitting: Licensed Clinical Social Worker

## 2017-01-29 NOTE — Patient Outreach (Signed)
Assessment:  CSW received referral on Jacob [name]. Jacob [name]. CSW completed chart review on client on 01/29/17.  Client sees Dr. Sharilyn Sites as primary care doctor. Client was recently hospitalized. He discharged from the hospital and admitted to [facility] and Nursing facility in [place], [place].  He is receiving nursing care and physical therapy support at [facility] facility. Client has support from his [place]. CSW called client on 01/29/17 and CSW spoke via phone with client on 01/29/17. CSW verified client identity. CSW received verbal permission from client on 01/29/17 for CSW to communicate with client about client needs and status. Client said he was not having any pain issues. He said he had normal appetite but that he had lost about 10 pounds.  He has talked with nurses at facility about weight loss issue. He said he usually eats more and that the portions of food he receives are not as large as those he normally eats. CSW and client spoke of [facility] consent form completion. CSW and client completed needed [facility] assessments for client. Client said he had been receiving physical therapy services at facility. CSW and client spoke of client care plan. CSW encouraged client to participate in all scheduled client physical therapy sessions for client at facility in the next 30 days. Client said he could walk a short distance via use of a walker. He said he sometimes fatigues in walking and has to take rest breaks as needed.  He spoke of having recent surgery on his right foot.  He said he has support from his sister, Jacob [name]. He said that his sister resides only a few miles from home of client. Client said he hoped to be at facility a short while and hopes to discharge from facility to return to his home with needed supports in place. CSW encouraged [name] to communicate with facility social worker to finalize discharge plans for client.  Client was appreciative of phone call from [place] on 01/29/17.   CSW thanked [place] for phone call with CSW on 01/29/17.   Plan:  Client to participate in all scheduled client physical therapy sessions for client at facility in the next 30 days.  CSW to call client in 2 weeks to assess client status and needs at that time.  Jacob [name].Shalicia Craghead MSW, LCSW Licensed Clinical Social Worker [facility] Care Management [phone]

## 2017-01-30 DIAGNOSIS — D509 Iron deficiency anemia, unspecified: Secondary | ICD-10-CM | POA: Diagnosis not present

## 2017-01-30 DIAGNOSIS — N186 End stage renal disease: Secondary | ICD-10-CM | POA: Diagnosis not present

## 2017-01-30 DIAGNOSIS — Z992 Dependence on renal dialysis: Secondary | ICD-10-CM | POA: Diagnosis not present

## 2017-01-30 DIAGNOSIS — D631 Anemia in chronic kidney disease: Secondary | ICD-10-CM | POA: Diagnosis not present

## 2017-01-30 DIAGNOSIS — N2581 Secondary hyperparathyroidism of renal origin: Secondary | ICD-10-CM | POA: Diagnosis not present

## 2017-02-01 ENCOUNTER — Other Ambulatory Visit: Payer: Self-pay | Admitting: Licensed Clinical Social Worker

## 2017-02-01 DIAGNOSIS — N2581 Secondary hyperparathyroidism of renal origin: Secondary | ICD-10-CM | POA: Diagnosis not present

## 2017-02-01 DIAGNOSIS — D631 Anemia in chronic kidney disease: Secondary | ICD-10-CM | POA: Diagnosis not present

## 2017-02-01 DIAGNOSIS — Z89431 Acquired absence of right foot: Secondary | ICD-10-CM | POA: Diagnosis not present

## 2017-02-01 DIAGNOSIS — N186 End stage renal disease: Secondary | ICD-10-CM | POA: Diagnosis not present

## 2017-02-01 DIAGNOSIS — T8131XA Disruption of external operation (surgical) wound, not elsewhere classified, initial encounter: Secondary | ICD-10-CM | POA: Diagnosis not present

## 2017-02-01 DIAGNOSIS — Z992 Dependence on renal dialysis: Secondary | ICD-10-CM | POA: Diagnosis not present

## 2017-02-01 NOTE — Patient Outreach (Signed)
[facility]) Care Management  [facility] Social Work  02/01/2017  ILHAN MADAN [birth year] [id number]  Subjective:    Objective:   Encounter Medications:  Outpatient Encounter Prescriptions as of 02/01/2017  Medication Sig  . acetaminophen (TYLENOL) 500 MG tablet Take 1,000 mg by mouth every 6 (six) hours as needed for mild pain.  [name] aspirin EC 325 MG tablet Take 325 mg by mouth daily.  [name] atorvastatin (LIPITOR) 40 MG tablet Take 40 mg by mouth daily.  . B Complex-C-Folic Acid (DIALYVITE TABLET) TABS Take 1 tablet by mouth every Monday, Wednesday, and Friday.   . bisacodyl (DULCOLAX) 5 MG EC tablet Take 2 tablets (10 mg total) by mouth daily as needed for moderate constipation.  . carvedilol (COREG) 6.25 MG tablet Take 6.25 mg by mouth 2 (two) times daily with a meal. Take 6.30ms once daily on dialysis days, Mondays, Wednesdays, and Fridays  . cinacalcet (SENSIPAR) 30 MG tablet Take 30 mg by mouth daily.  . clopidogrel (PLAVIX) 75 MG tablet Take 1 tablet (75 mg total) by mouth daily with breakfast.  . dextromethorphan (ROBITUSSIN 12 HOUR COUGH) 30 MG/5ML liquid Take by mouth as needed for cough.  . divalproex (DEPAKOTE) 500 MG DR tablet Take 1 tablet (500 mg total) by mouth every 12 (twelve) hours.  .Marland KitchenEPIPEN 2-PAK 0.3 MG/0.3ML SOAJ injection Inject 0.3 mg into the muscle once as needed (For anaphylaxis.).   .Marland KitchenLANTUS SOLOSTAR 100 UNIT/ML Solostar Pen Inject 20 Units into the skin at bedtime. If blood sugar is lower than 150 only inject 20 units at bedtime  . lisinopril (PRINIVIL,ZESTRIL) 20 MG tablet Take 20 mg by mouth daily.  .Marland Kitchenloratadine (CLARITIN) 10 MG tablet Take 10 mg by mouth daily as needed for allergies.  .Marland Kitchenomeprazole (PRILOSEC) 40 MG capsule Take 40 mg by mouth daily.   .Marland KitchenoxyCODONE-acetaminophen (PERCOCET/ROXICET) 5-325 MG tablet Take 1 tablet by mouth every 4 (four) hours as needed for moderate pain. (Patient not taking: Reported on 01/18/2017)  . ramipril (ALTACE) 10  MG capsule Take 10 mg by mouth daily.   .Marland KitchenRENVELA 800 MG tablet Take 800-2,400 mg by mouth 3 (three) times daily with meals. Take 2400 mg by mouth 3 times daily with meals and 800 mg by mouth with snacks   No facility-administered encounter medications on file as of 02/01/2017.     Functional Status:  In your present state of health, do you have any difficulty performing the following activities: 01/29/2017 12/29/2016  Hearing? N Y  Vision? N N  Difficulty concentrating or making decisions? N Y  Walking or climbing stairs? Y N  Dressing or bathing? N N  Doing errands, shopping? YTempie Donning Preparing Food and eating ? Y -  Using the Toilet? N -  In the past six months, have you accidently leaked urine? N -  Do you have problems with loss of bowel control? N -  Managing your Medications? Y -  Managing your Finances? Y -  Housekeeping or managing your Housekeeping? Y -  Some recent data might be hidden    Fall/Depression Screening:  PHQ 2/9 Scores 01/29/2017  PHQ - 2 Score 2  PHQ- 9 Score 6    Assessment:   [facility] [place]. CS[facility] [place] Client is receiving nursing care and physical therapy support at facility. CSW  and client spoke of client care plan. CSW encouraged client to participate in all scheduled client physical therapy sessions for client in next 30 days at nursing facility.   Client said he does not sleep very well.  Client said he has talked with care providers about possibly discharging from nursing facility this week. He said that on discharge from facility he will return to his home.  CSW encouraged client to communicate with [facility] facility social worker to finalize client discharge plans.  Client said his sister lives nearby and provides some support for client.  Client said he has dialysis 3 times weekly at [facility] in [place],  [place].  Client said he has been receiving dialysis for about  8 years.  Client said he would talk with [facility], facility social worker,  about discharge needs of client.  Client said he has lost about 10 pounds in weight.  He said he is walking fairly well with use of a walker. CSW thanked client for allowing CSW to visit client on 02/01/17. CSW spoke with client about [facility] consent form completion. CSW gave client a blank [facility] consent form for client to review. CSW gave client [facility] CSW card and encouraged client to call CSW at [phone] as needed to discuss social work needs of client.      Plan:     Client to participate in all scheduled client physical therapy sessions in next 30 days at nursing facility.  CSW to call client in 3 weeks to assess client needs at that time.  Norva Riffle.Darely Becknell MSW, LCSW Licensed Clinical Social Worker [facility] Care Management [phone]

## 2017-02-02 DIAGNOSIS — I10 Essential (primary) hypertension: Secondary | ICD-10-CM | POA: Diagnosis not present

## 2017-02-02 DIAGNOSIS — N2581 Secondary hyperparathyroidism of renal origin: Secondary | ICD-10-CM | POA: Diagnosis not present

## 2017-02-02 DIAGNOSIS — E871 Hypo-osmolality and hyponatremia: Secondary | ICD-10-CM | POA: Diagnosis not present

## 2017-02-02 DIAGNOSIS — D631 Anemia in chronic kidney disease: Secondary | ICD-10-CM | POA: Diagnosis not present

## 2017-02-02 DIAGNOSIS — Z992 Dependence on renal dialysis: Secondary | ICD-10-CM | POA: Diagnosis not present

## 2017-02-02 DIAGNOSIS — G4089 Other seizures: Secondary | ICD-10-CM | POA: Diagnosis not present

## 2017-02-02 DIAGNOSIS — N186 End stage renal disease: Secondary | ICD-10-CM | POA: Diagnosis not present

## 2017-02-02 DIAGNOSIS — E1121 Type 2 diabetes mellitus with diabetic nephropathy: Secondary | ICD-10-CM | POA: Diagnosis not present

## 2017-02-04 DIAGNOSIS — Z992 Dependence on renal dialysis: Secondary | ICD-10-CM | POA: Diagnosis not present

## 2017-02-04 DIAGNOSIS — N2581 Secondary hyperparathyroidism of renal origin: Secondary | ICD-10-CM | POA: Diagnosis not present

## 2017-02-04 DIAGNOSIS — N186 End stage renal disease: Secondary | ICD-10-CM | POA: Diagnosis not present

## 2017-02-04 DIAGNOSIS — D631 Anemia in chronic kidney disease: Secondary | ICD-10-CM | POA: Diagnosis not present

## 2017-02-05 ENCOUNTER — Other Ambulatory Visit: Payer: Self-pay | Admitting: *Deleted

## 2017-02-05 ENCOUNTER — Telehealth: Payer: Self-pay

## 2017-02-05 DIAGNOSIS — T8131XD Disruption of external operation (surgical) wound, not elsewhere classified, subsequent encounter: Secondary | ICD-10-CM | POA: Diagnosis not present

## 2017-02-05 DIAGNOSIS — Z992 Dependence on renal dialysis: Secondary | ICD-10-CM | POA: Diagnosis not present

## 2017-02-05 DIAGNOSIS — N186 End stage renal disease: Secondary | ICD-10-CM | POA: Diagnosis not present

## 2017-02-05 DIAGNOSIS — M6281 Muscle weakness (generalized): Secondary | ICD-10-CM | POA: Diagnosis not present

## 2017-02-05 DIAGNOSIS — E1122 Type 2 diabetes mellitus with diabetic chronic kidney disease: Secondary | ICD-10-CM | POA: Diagnosis not present

## 2017-02-05 DIAGNOSIS — I120 Hypertensive chronic kidney disease with stage 5 chronic kidney disease or end stage renal disease: Secondary | ICD-10-CM | POA: Diagnosis not present

## 2017-02-05 NOTE — Patient Outreach (Signed)
Referral received from [place], pt recently discharged from [facility] on 02/03/17, Telephone call to pt for transition of care week 1, spoke with pt, HIPAA verified, pt reports he goes to dialysis Monday, Wednesday, Friday, has home health RN and PT and assisting with dressing change to right foot (amputation of all toes per pt).  Checks CBG BID with reading today 135.  Pt states he has a lot going on with dialysis and home health but is agreeable to at least one home visit with RN CM to review medications and provide assessment.  RN CM faxed transition of care note to primary MD Dr. Sharilyn Sites.  THN CM Care Plan Problem One     Most Recent Value  Care Plan Problem One  Pt high risk for hospitalization related to disease processes (ESRD, diabetes)  Role Documenting the Problem One  Care Management Coordinator  Care Plan for Problem One  Active  THN Long Term Goal   pt will demonstrate self care for diabetes, ESRD, wound care within 60 days.  THN Long Term Goal Start Date  02/05/17  Interventions for Problem One Long Term Goal  RN CM encouraged pt to work with home for best outcomes with wound healing (amputation site right foot all toes), importance of prescribing to diet and adequate protein for wound healing.      PLAN See pt for initial home visit next week  Jacqlyn Larsen [facility], [place] Coordinator [phone]

## 2017-02-05 NOTE — Telephone Encounter (Signed)
Melissa with [facility] called to report that Mr. Crumble' R transmetatarsal amputation incision was dehisced. Also stated the wound had a lot slough and an odor. She stated the patient's vitals were normal today. Called patient and offered an appointment to come in to be seen today by our NP. Patient declined stating that he would like to give it a week because he thinks it's doing a little better and was doing much better until the rehab stated putting "something brown on it". Mr. Brabson denied pain and fever and stated that the swelling was getting better. Instructed Mr. Gelles that if the wound began to look worse, gets warm to the touch or painful to give our office a call. Patient gave verbal understanding.  Nurse Lenna Sciara was called and given this information as well and was also asked to give our office on Friday or before it things changed.

## 2017-02-06 DIAGNOSIS — Z992 Dependence on renal dialysis: Secondary | ICD-10-CM | POA: Diagnosis not present

## 2017-02-06 DIAGNOSIS — D631 Anemia in chronic kidney disease: Secondary | ICD-10-CM | POA: Diagnosis not present

## 2017-02-06 DIAGNOSIS — N2581 Secondary hyperparathyroidism of renal origin: Secondary | ICD-10-CM | POA: Diagnosis not present

## 2017-02-06 DIAGNOSIS — N186 End stage renal disease: Secondary | ICD-10-CM | POA: Diagnosis not present

## 2017-02-08 DIAGNOSIS — D631 Anemia in chronic kidney disease: Secondary | ICD-10-CM | POA: Diagnosis not present

## 2017-02-08 DIAGNOSIS — N186 End stage renal disease: Secondary | ICD-10-CM | POA: Diagnosis not present

## 2017-02-08 DIAGNOSIS — T8131XD Disruption of external operation (surgical) wound, not elsewhere classified, subsequent encounter: Secondary | ICD-10-CM | POA: Diagnosis not present

## 2017-02-08 DIAGNOSIS — I120 Hypertensive chronic kidney disease with stage 5 chronic kidney disease or end stage renal disease: Secondary | ICD-10-CM | POA: Diagnosis not present

## 2017-02-08 DIAGNOSIS — M6281 Muscle weakness (generalized): Secondary | ICD-10-CM | POA: Diagnosis not present

## 2017-02-08 DIAGNOSIS — Z992 Dependence on renal dialysis: Secondary | ICD-10-CM | POA: Diagnosis not present

## 2017-02-08 DIAGNOSIS — N2581 Secondary hyperparathyroidism of renal origin: Secondary | ICD-10-CM | POA: Diagnosis not present

## 2017-02-08 DIAGNOSIS — E1122 Type 2 diabetes mellitus with diabetic chronic kidney disease: Secondary | ICD-10-CM | POA: Diagnosis not present

## 2017-02-10 ENCOUNTER — Encounter: Payer: Self-pay | Admitting: Surgery

## 2017-02-10 DIAGNOSIS — M6281 Muscle weakness (generalized): Secondary | ICD-10-CM | POA: Diagnosis not present

## 2017-02-10 DIAGNOSIS — N186 End stage renal disease: Secondary | ICD-10-CM | POA: Diagnosis not present

## 2017-02-10 DIAGNOSIS — I120 Hypertensive chronic kidney disease with stage 5 chronic kidney disease or end stage renal disease: Secondary | ICD-10-CM | POA: Diagnosis not present

## 2017-02-10 DIAGNOSIS — E1122 Type 2 diabetes mellitus with diabetic chronic kidney disease: Secondary | ICD-10-CM | POA: Diagnosis not present

## 2017-02-10 DIAGNOSIS — N2581 Secondary hyperparathyroidism of renal origin: Secondary | ICD-10-CM | POA: Diagnosis not present

## 2017-02-10 DIAGNOSIS — T8131XD Disruption of external operation (surgical) wound, not elsewhere classified, subsequent encounter: Secondary | ICD-10-CM | POA: Diagnosis not present

## 2017-02-10 DIAGNOSIS — D631 Anemia in chronic kidney disease: Secondary | ICD-10-CM | POA: Diagnosis not present

## 2017-02-10 DIAGNOSIS — Z992 Dependence on renal dialysis: Secondary | ICD-10-CM | POA: Diagnosis not present

## 2017-02-11 ENCOUNTER — Encounter: Payer: Self-pay | Admitting: *Deleted

## 2017-02-11 ENCOUNTER — Other Ambulatory Visit: Payer: Self-pay | Admitting: *Deleted

## 2017-02-11 ENCOUNTER — Other Ambulatory Visit: Payer: Self-pay | Admitting: Licensed Clinical Social Worker

## 2017-02-11 DIAGNOSIS — N186 End stage renal disease: Secondary | ICD-10-CM | POA: Diagnosis not present

## 2017-02-11 DIAGNOSIS — Z992 Dependence on renal dialysis: Secondary | ICD-10-CM | POA: Diagnosis not present

## 2017-02-11 DIAGNOSIS — E1122 Type 2 diabetes mellitus with diabetic chronic kidney disease: Secondary | ICD-10-CM | POA: Diagnosis not present

## 2017-02-11 DIAGNOSIS — M6281 Muscle weakness (generalized): Secondary | ICD-10-CM | POA: Diagnosis not present

## 2017-02-11 DIAGNOSIS — I120 Hypertensive chronic kidney disease with stage 5 chronic kidney disease or end stage renal disease: Secondary | ICD-10-CM | POA: Diagnosis not present

## 2017-02-11 DIAGNOSIS — T8131XD Disruption of external operation (surgical) wound, not elsewhere classified, subsequent encounter: Secondary | ICD-10-CM | POA: Diagnosis not present

## 2017-02-11 NOTE — Patient Outreach (Signed)
Assessment:  CSW spoke via phone with client. CSW verified client identity.  CSW received verbal permission from client on 02/11/17 for CSW to communicate with client about current client needs and status. Client had recently received care at [facility] and Nursing in [place], [place]. He discharged recently from that facility and returned to his home with needed supports in place. Client has support from his sister who lives in close proximity to client.  Client said he had his prescribed medications and is taking medications as prescribed. Client does attend dialysis appointments weekly as scheduled. Client has been receiving in home nursing care, as ordered, for wound care for right foot.  Client is receiving [facility] nursing support with RN Jacqlyn Larsen. Client mentioned to [place] on 02/11/17 that client had lost 20 pounds in 21 days.  CSW communicated this information regarding client weight loss to RN Jacqlyn Larsen on 02/11/17. Client said he had no pain issues. He said that home health nurse was visiting him 3 times weekly for wound care for client's right foot. CSW and client spoke of client care plan. CSW encouraged client to communicate with CSW in next 30 days to discuss community resource of assistance for client. CSW spoke with client about [facility] program services. Client said he had gone to appointment with Dr. Hilma Favors, his primary care doctor, since he was discharged from nursing center. CSW encouraged client to call CSW at [phone] as needed to discuss social work needs of client.  Client was appreciative of phone call from [place] on 02/11/17.   Plan:  Client to communicate with CSW in next 30 days to discuss community resources of assistance for client.  CSW to collaborate with RN Jacqlyn Larsen in monitoring needs of client.  CSW to call client in 4 weeks to assess client needs at that time.  Norva Riffle.Lynsay Fesperman MSW, LCSW Licensed Clinical Social Worker [facility] Care Management [phone]

## 2017-02-11 NOTE — Patient Outreach (Signed)
[facility]) Care Management   02/11/2017  Jacob [name] Dec 20, 1950 [id number]  Jacob [name] is an 66 y.o. male  Subjective: Routine home visit with pt, HIPAA verified, pt reports he eats "whatever he wants most of the time"  Tries to drink diet beverages but sometimes drinks sugary soft drinks.  Pt states home health through [facility] RN and PT assist at present, RN changes dressing to right foot amputation site (all toes amputated).  Pt reports he goes to [facility] in [place] on Monday, Wed. Friday.  Pt states he has lost weight recently because he did not like the food at SNF.  Pt states most CBG readings in 100's range.  Objective:   Vitals:   02/11/17 1239  BP: (!) 142/74  Pulse: 80  Resp: 18  Weight: 282 lb (127.9 kg)  Height: 1.88 m (6\' 2" )  Fasting CBG today 145 14 day average 171 28 day average 171  ROS  Physical Exam  Constitutional: He is oriented to person, place, and time. He appears well-developed and well-nourished.  HENT:  Head: Normocephalic.  Neck: Normal range of motion. Neck supple.  Cardiovascular: Normal rate.   Irregular rhythm   Respiratory: Effort normal and breath sounds normal.  GI: Soft. Bowel sounds are normal.  Musculoskeletal: Normal range of motion. He exhibits no edema.  Neurological: He is alert and oriented to person, place, and time.  Skin: Skin is warm and dry.  Right foot with dressing intact, home health changes dressing- pt had all toes on right foot amputated  Psychiatric: He has a normal mood and affect. His behavior is normal. Judgment and thought content normal.    Encounter Medications:   Outpatient Encounter Prescriptions as of 02/11/2017  Medication Sig  . acetaminophen (TYLENOL) 500 MG tablet Take 1,000 mg by mouth every 6 (six) hours as needed for mild pain.  [name] aspirin EC 325 MG tablet Take 325 mg by mouth daily.  . B Complex-C-Folic Acid (DIALYVITE TABLET) TABS Take 1 tablet by mouth every Monday,  Wednesday, and Friday.   . bisacodyl (DULCOLAX) 5 MG EC tablet Take 2 tablets (10 mg total) by mouth daily as needed for moderate constipation.  . carvedilol (COREG) 6.25 MG tablet Take 6.25 mg by mouth 2 (two) times daily with a meal. Take 6.5mg s once daily on dialysis days, Mondays, Wednesdays, and Fridays  . cinacalcet (SENSIPAR) 30 MG tablet Take 30 mg by mouth daily.  [name] LANTUS SOLOSTAR 100 UNIT/ML Solostar Pen Inject 20 Units into the skin at bedtime. If blood sugar is lower than 150 only inject 20 units at bedtime  . loratadine (CLARITIN) 10 MG tablet Take 10 mg by mouth daily as needed for allergies.  [name] omeprazole (PRILOSEC) 40 MG capsule Take 40 mg by mouth daily.   . ramipril (ALTACE) 10 MG capsule Take 10 mg by mouth daily.   [name] RENVELA 800 MG tablet Take 800-2,400 mg by mouth 3 (three) times daily with meals. Take 2400 mg by mouth 3 times daily with meals and 800 mg by mouth with snacks  . atorvastatin (LIPITOR) 40 MG tablet Take 40 mg by mouth daily.  . clopidogrel (PLAVIX) 75 MG tablet Take 1 tablet (75 mg total) by mouth daily with breakfast. (Patient not taking: Reported on 02/11/2017)  . dextromethorphan (ROBITUSSIN 12 HOUR COUGH) 30 MG/5ML liquid Take by mouth as needed for cough.  . divalproex (DEPAKOTE) 500 MG DR tablet Take 1 tablet (500 mg total) by mouth every  12 (twelve) hours. (Patient not taking: Reported on 02/11/2017)  . EPIPEN 2-PAK 0.3 MG/0.3ML SOAJ injection Inject 0.3 mg into the muscle once as needed (For anaphylaxis.).   [name] lisinopril (PRINIVIL,ZESTRIL) 20 MG tablet Take 20 mg by mouth daily.  [name] oxyCODONE-acetaminophen (PERCOCET/ROXICET) 5-325 MG tablet Take 1 tablet by mouth every 4 (four) hours as needed for moderate pain. (Patient not taking: Reported on 01/18/2017)   No facility-administered encounter medications on file as of 02/11/2017.     Functional Status:   In your present state of health, do you have any difficulty performing the following activities:  01/29/2017 12/29/2016  Hearing? N Y  Vision? N N  Difficulty concentrating or making decisions? N Y  Walking or climbing stairs? Y N  Dressing or bathing? N N  Doing errands, shopping? Tempie Donning  Preparing Food and eating ? Y -  Using the Toilet? N -  In the past six months, have you accidently leaked urine? N -  Do you have problems with loss of bowel control? N -  Managing your Medications? Y -  Managing your Finances? Y -  Housekeeping or managing your Housekeeping? Y -  Some recent data might be hidden    Fall/Depression Screening:    Fall Risk  01/29/2017  Falls in the past year? No  Risk for fall due to : Impaired balance/gait;Impaired mobility   PHQ 2/9 Scores 01/29/2017  PHQ - 2 Score 2  PHQ- 9 Score 6    Assessment:  RN CM observed and reviewed all medication bottles with pt.  Reviewed importance of limiting foods high in carbohydrates, pt does verbalize he is not ready to change his diet at present, pt eats a lot of bread, cookies, fast food.  RN CM reviewed safety precautions and using walker at all times,  Pt is wearing off loading shoes bil.  RN CM faxed initial home visit and barrier letter to primary MD Dr. Hilma Favors.  THN CM Care Plan Problem One     Most Recent Value  Care Plan Problem One  Pt high risk for hospitalization related to disease processes (ESRD, diabetes)  Role Documenting the Problem One  Care Management Coordinator  Care Plan for Problem One  Active  THN Long Term Goal   pt will demonstrate self care for diabetes, ESRD, wound care within 60 days.  THN Long Term Goal Start Date  02/05/17  Interventions for Problem One Long Term Goal  RN CM reiterated importance of pt working with home for best outcomes with wound healing (amputation site right foot all toes), importance of prescribing to diet and adequate protein for wound healing.  THN CM Short Term Goal #1   Pt will check CBG and log in [facility] calendar within 30 days  THN CM Short Term Goal #1 Start Date   02/11/17  Interventions for Short Term Goal #1  RN CM ask pt to record all CBG readings in [facility] calendar, RN CM reviewed glucometer readings and averages, talked with pt about importance of good blood sugar control.      Plan: follow up home visit next month Continue weekly transition of care calls  Jacqlyn Larsen [facility], [place] Coordinator [phone]

## 2017-02-12 DIAGNOSIS — I120 Hypertensive chronic kidney disease with stage 5 chronic kidney disease or end stage renal disease: Secondary | ICD-10-CM | POA: Diagnosis not present

## 2017-02-12 DIAGNOSIS — Z992 Dependence on renal dialysis: Secondary | ICD-10-CM | POA: Diagnosis not present

## 2017-02-12 DIAGNOSIS — N2581 Secondary hyperparathyroidism of renal origin: Secondary | ICD-10-CM | POA: Diagnosis not present

## 2017-02-12 DIAGNOSIS — T8131XD Disruption of external operation (surgical) wound, not elsewhere classified, subsequent encounter: Secondary | ICD-10-CM | POA: Diagnosis not present

## 2017-02-12 DIAGNOSIS — N186 End stage renal disease: Secondary | ICD-10-CM | POA: Diagnosis not present

## 2017-02-12 DIAGNOSIS — E1122 Type 2 diabetes mellitus with diabetic chronic kidney disease: Secondary | ICD-10-CM | POA: Diagnosis not present

## 2017-02-12 DIAGNOSIS — M6281 Muscle weakness (generalized): Secondary | ICD-10-CM | POA: Diagnosis not present

## 2017-02-12 DIAGNOSIS — D631 Anemia in chronic kidney disease: Secondary | ICD-10-CM | POA: Diagnosis not present

## 2017-02-15 ENCOUNTER — Encounter: Payer: Self-pay | Admitting: Family

## 2017-02-15 ENCOUNTER — Telehealth: Payer: Self-pay | Admitting: *Deleted

## 2017-02-15 DIAGNOSIS — Z992 Dependence on renal dialysis: Secondary | ICD-10-CM | POA: Diagnosis not present

## 2017-02-15 DIAGNOSIS — E1122 Type 2 diabetes mellitus with diabetic chronic kidney disease: Secondary | ICD-10-CM | POA: Diagnosis not present

## 2017-02-15 DIAGNOSIS — N2581 Secondary hyperparathyroidism of renal origin: Secondary | ICD-10-CM | POA: Diagnosis not present

## 2017-02-15 DIAGNOSIS — N186 End stage renal disease: Secondary | ICD-10-CM | POA: Diagnosis not present

## 2017-02-15 DIAGNOSIS — D631 Anemia in chronic kidney disease: Secondary | ICD-10-CM | POA: Diagnosis not present

## 2017-02-15 DIAGNOSIS — M6281 Muscle weakness (generalized): Secondary | ICD-10-CM | POA: Diagnosis not present

## 2017-02-15 DIAGNOSIS — T8131XD Disruption of external operation (surgical) wound, not elsewhere classified, subsequent encounter: Secondary | ICD-10-CM | POA: Diagnosis not present

## 2017-02-15 DIAGNOSIS — I120 Hypertensive chronic kidney disease with stage 5 chronic kidney disease or end stage renal disease: Secondary | ICD-10-CM | POA: Diagnosis not present

## 2017-02-15 NOTE — Telephone Encounter (Signed)
Kim, nurse with [facility] called stating the post transmetatarsal amputation site is worsening, she is requesting that the patient be seen as soon as possible.  An appointment was scheduled with the NP tomorrow, 02/16/2017.  Nurse Maudie Mercury is to notify the patient and enforce to him the importance of keeping the appointment.

## 2017-02-16 ENCOUNTER — Other Ambulatory Visit: Payer: Self-pay | Admitting: *Deleted

## 2017-02-16 ENCOUNTER — Ambulatory Visit (INDEPENDENT_AMBULATORY_CARE_PROVIDER_SITE_OTHER): Payer: Self-pay | Admitting: Family

## 2017-02-16 ENCOUNTER — Encounter: Payer: Self-pay | Admitting: Family

## 2017-02-16 VITALS — BP 191/103 | HR 79 | Temp 98.3°F | Resp 20 | Ht 74.0 in | Wt 279.0 lb

## 2017-02-16 DIAGNOSIS — Z89431 Acquired absence of right foot: Secondary | ICD-10-CM

## 2017-02-16 DIAGNOSIS — N186 End stage renal disease: Secondary | ICD-10-CM

## 2017-02-16 DIAGNOSIS — Z992 Dependence on renal dialysis: Secondary | ICD-10-CM

## 2017-02-16 NOTE — Patient Outreach (Signed)
Telephone call to patient for transition of care week 3, no answer to telephone and no option to leave voicemail.  PLAN Continue weekly transition of care calls  Jacqlyn Larsen [facility], [place] Coordinator [phone]

## 2017-02-16 NOTE — Progress Notes (Signed)
Postoperative Visit   History of Present Illness  Jacob [name] is a 66 y.o. male who is s/p right transmetatarsal amputation on 01/01/17 by Dr. Trula Slade.  When his wound was inspected a few days prior to his 01-18-17 visit, it was not open.  On inspection that day, there was a dehiscence of the wound.  Dressing was changed to silver alginate.  He is brought in today for wound check.   Pt's Pravena vac was removed on 01/08/17.  His wound had been healing but dehisced over a weekend.  No evidence of infection.  Silver alginate was started by wound care at the [facility].   Also, elevation of right leg to help minimize swelling.   Will continue this regimen and have pt f/u with Dr. Trula Slade in 3 weeks to check the wound.   We will do our best to try to salvage this transmetatarsal amputation.  He will continue with local wound care at the [facility].  He is scheduled to go home soon.  He will follow up in 3 weeks for wound check.  At that time we will remove his nylon sutures.  Pt returns today after phone call from Maudie Mercury, nurse with [facility], stating the post transmetatarsal amputation site is worsening, she is requesting that the patient be seen as soon as possible.  He denies any pain with his foot and only takes a pain pill occasionally.    He dialyzes M-W-F via right upper arm AVF. Pt states HH visit 3 days/week.   His A1C on 12-28-16 was 7.7. He has never used tobacco.   For VQI Use Only  PRE-ADM LIVING: Home  AMB STATUS: Ambulatory with walker.   Past Medical History:  Diagnosis Date  . Anemia   . Anemia in chronic kidney disease(285.21)   . Anxiety   . Arthritis   . Depression   . Dyspnea    with exertion  . End stage renal disease ([place])    Hemodialysis on Monday, Wednesday, Friday- in [place]  . Essential hypertension   . GERD (gastroesophageal reflux disease)   . Glaucoma   . History of blood transfusion   . Insomnia   . Nonischemic cardiomyopathy ([place])   . Stroke  [facility]) 2016   - "they said it was not a stroke"  . Type 2 diabetes mellitus ([place])    Type II    Past Surgical History:  Procedure Laterality Date  . A/V SHUNTOGRAM Right 10/06/2016   Procedure: A/V Fistulagram;  Surgeon: [name], MD;  Location: [place] CV [facility];  Service: Cardiovascular;  Laterality: Right;  . ABDOMINAL AORTOGRAM N/A 12/30/2016   Procedure: Abdominal Aortogram;  Surgeon: Waynetta Sandy, MD;  Location: [place] [facility];  Service: Cardiovascular;  Laterality: N/A;  . AV FISTULA PLACEMENT     Hx: of  . AV FISTULA PLACEMENT Right 05/04/2013   Procedure: ARTERIOVENOUS (AV) FISTULA CREATION- RIGHT ARM;  Surgeon: Conrad Cleone, MD;  Location: [place];  Service: Vascular;  Laterality: Right;  Ultrasound guided  . AV FISTULA PLACEMENT Right 07/28/2016   Procedure: BRACHIOCEPHALIC ARTERIOVENOUS (AV) FISTULA CREATION;  Surgeon: [name], MD;  Location: [place];  Service: Vascular;  Laterality: Right;  . CATARACT EXTRACTION W/PHACO Left 07/16/2014   Procedure: CATARACT EXTRACTION PHACO AND INTRAOCULAR LENS PLACEMENT (IOC);  Surgeon: [name], MD;  Location: [facility];  Service: Ophthalmology;  Laterality: Left;  CDE:8.86  . CATARACT EXTRACTION W/PHACO Right 07/30/2014   Procedure:  CATARACT EXTRACTION PHACO AND INTRAOCULAR LENS PLACEMENT (IOC);  Surgeon: [name], MD;  Location: [facility];  Service: Ophthalmology;  Laterality: Right;  CDE 8.99  . COLONOSCOPY     Hx: of  . FISTULA SUPERFICIALIZATION Right 07/28/2016   Procedure: FISTULA SUPERFICIALIZATION;  Surgeon: [name], MD;  Location: [place];  Service: Vascular;  Laterality: Right;  . FISTULA SUPERFICIALIZATION Right 10/13/2016   Procedure: BRACHIOCEPHALIC ARTERIOVENOUS FISTULA SUPERFICIALIZATION;  Surgeon: [name], MD;  Location: [place];  Service: Vascular;  Laterality: Right;  . FISTULOGRAM N/A 05/04/2013   Procedure: CENTRAL VENOGRAM;  Surgeon: Conrad Delanson, MD;  Location: [place];   Service: Vascular;  Laterality: N/A;  . INSERTION OF DIALYSIS CATHETER Right 05/04/2013   Procedure: INSERTION OF DIALYSIS CATHETER;  Surgeon: Conrad Krakow, MD;  Location: [place];  Service: Vascular;  Laterality: Right;  Ultrasound guided  . INSERTION OF DIALYSIS CATHETER N/A 07/28/2016   Procedure: INSERTION OF DIALYSIS CATHETER;  Surgeon: [name], MD;  Location: [place];  Service: Vascular;  Laterality: N/A;  . LIGATION OF ARTERIOVENOUS  FISTULA Left 05/04/2013   Procedure: LIGATION OF LEFT RADIAL CEPHALIC ARTERIOVENOUS  FISTULA;  Surgeon: Conrad [place], MD;  Location: [place];  Service: Vascular;  Laterality: Left;  Ultrasound guided  . LIGATION OF ARTERIOVENOUS  FISTULA Right 07/28/2016   Procedure: LIGATION OF RADIOCEPHALIC ARTERIOVENOUS  FISTULA;  Surgeon: [name], MD;  Location: [place];  Service: Vascular;  Laterality: Right;  . LOWER EXTREMITY ANGIOGRAPHY N/A 12/30/2016   Procedure: Lower Extremity Angiography;  Surgeon: Waynetta Sandy, MD;  Location: [place] [facility];  Service: Cardiovascular;  Laterality: N/A;  . LUMBAR SPINE SURGERY    . PERIPHERAL VASCULAR ATHERECTOMY Right 12/30/2016   Procedure: Peripheral Vascular Atherectomy;  Surgeon: Waynetta Sandy, MD;  Location: [place] [facility];  Service: Cardiovascular;  Laterality: Right;  AT and PT  . PERIPHERAL VASCULAR BALLOON ANGIOPLASTY Right 12/30/2016   Procedure: Peripheral Vascular Balloon Angioplasty;  Surgeon: Waynetta Sandy, MD;  Location: [place] [facility];  Service: Cardiovascular;  Laterality: Right;  PTA of PT and AT  . PERIPHERAL VASCULAR CATHETERIZATION N/A 01/24/2015   Procedure: Fistulagram;  Surgeon: Conrad Pleasanton, MD;  Location: [place] [facility];  Service: Cardiovascular;  Laterality: N/A;  . PERIPHERAL VASCULAR CATHETERIZATION Right 06/04/2016   Procedure: A/V Shuntogram/Fistulagram;  Surgeon: Conrad Earlington, MD;  Location: [place] [facility];  Service:  Cardiovascular;  Laterality: Right;  . REVISON OF ARTERIOVENOUS FISTULA Right 01/02/2014   Procedure: REVISON OF ARTERIOVENOUS FISTULA ANASTOMOSIS;  Surgeon: Conrad Darling, MD;  Location: [place];  Service: Vascular;  Laterality: Right;  . REVISON OF ARTERIOVENOUS FISTULA Right 01/02/2016   Procedure: REVISION OF RADIOCEPHALIC ARTERIOVENOUS FISTULA  with BOVINE PATCH ANGIOPLASTY RIGHT RADIAL ARTERY;  Surgeon: Mal [name], MD;  Location: [place];  Service: Vascular;  Laterality: Right;  . SHUNTOGRAM N/A 11/07/2013   Procedure: Fistulogram;  Surgeon: [name], MD;  Location: [facility] CATH LAB;  Service: Cardiovascular;  Laterality: N/A;  . TRANSMETATARSAL AMPUTATION Right 01/01/2017   Procedure: TRANSMETATARSAL AMPUTATION;  Surgeon: [name], MD;  Location: [facility] OR;  Service: Vascular;  Laterality: Right;    Social History   Social History  . Marital status: Single    Spouse name: N/A  . Number of children: N/A  . Years of education: N/A   Occupational History  . Not on file.   Social History Main Topics  .  Smoking status: Never Smoker  . Smokeless tobacco: Never Used  . Alcohol use Yes     Comment: 1- fifth of gin a week  . Drug use: No  . Sexual activity: Yes    Birth control/ protection: None   Other Topics Concern  . Not on file   Social History Narrative  . No narrative on file    Allergies  Allergen Reactions  . Bee Venom Anaphylaxis  . Penicillins Swelling and Other (See Comments)    SWELLING REACTION UNSPECIFIED   Has patient had a PCN reaction causing immediate rash, facial/tongue/throat swelling, SOB or lightheadedness with hypotension: No Has patient had a PCN reaction causing severe rash involving mucus membranes or skin necrosis: No Has patient had a PCN reaction that required hospitalization No Has patient had a PCN reaction occurring within the last 10 years: No If all of the above answers are "NO", then may proceed with Cephalosporin use.    Current  Outpatient Prescriptions on File Prior to Visit  Medication Sig Dispense Refill  . acetaminophen (TYLENOL) 500 MG tablet Take 1,000 mg by mouth every 6 (six) hours as needed for mild pain.    [name] aspirin EC 325 MG tablet Take 325 mg by mouth daily.    [name] atorvastatin (LIPITOR) 40 MG tablet Take 40 mg by mouth daily.    . B Complex-C-Folic Acid (DIALYVITE TABLET) TABS Take 1 tablet by mouth every Monday, Wednesday, and Friday.     . bisacodyl (DULCOLAX) 5 MG EC tablet Take 2 tablets (10 mg total) by mouth daily as needed for moderate constipation. 30 tablet 0  . carvedilol (COREG) 6.25 MG tablet Take 6.25 mg by mouth 2 (two) times daily with a meal. Take 6.5mg s once daily on dialysis days, Mondays, Wednesdays, and Fridays    . cinacalcet (SENSIPAR) 30 MG tablet Take 30 mg by mouth daily.    . clopidogrel (PLAVIX) 75 MG tablet Take 1 tablet (75 mg total) by mouth daily with breakfast. 30 tablet 0  . dextromethorphan (ROBITUSSIN 12 HOUR COUGH) 30 MG/5ML liquid Take by mouth as needed for cough.    . divalproex (DEPAKOTE) 500 MG DR tablet Take 1 tablet (500 mg total) by mouth every 12 (twelve) hours. 60 tablet 0  . EPIPEN 2-PAK 0.3 MG/0.3ML SOAJ injection Inject 0.3 mg into the muscle once as needed (For anaphylaxis.).     [name] LANTUS SOLOSTAR 100 UNIT/ML Solostar Pen Inject 20 Units into the skin at bedtime. If blood sugar is lower than 150 only inject 20 units at bedtime    . lisinopril (PRINIVIL,ZESTRIL) 20 MG tablet Take 20 mg by mouth daily.    [name] loratadine (CLARITIN) 10 MG tablet Take 10 mg by mouth daily as needed for allergies.    [name] omeprazole (PRILOSEC) 40 MG capsule Take 40 mg by mouth daily.     [name] oxyCODONE-acetaminophen (PERCOCET/ROXICET) 5-325 MG tablet Take 1 tablet by mouth every 4 (four) hours as needed for moderate pain. 15 tablet 0  . ramipril (ALTACE) 10 MG capsule Take 10 mg by mouth daily.     [name] RENVELA 800 MG tablet Take 800-2,400 mg by mouth 3 (three) times daily with meals. Take 2400  mg by mouth 3 times daily with meals and 800 mg by mouth with snacks     No current facility-administered medications on file prior to visit.      Physical Examination  Vitals:   02/16/17 1119  BP: (!) 191/103  Pulse: 79  Resp: 20  Temp: 98.3 F (36.8 C)  TempSrc: Oral  SpO2: 94%  Weight: 279 lb (126.6 kg)  Height: 6\' 2"  (1.88 m)   Body mass index is 35.82 kg/m.   Right DP and PT arteries with brisk Doppler signal. Right foot transmetatarsal amputation site with muscle and fat exposed, slightly foul odor with fibrinous exudate, no drainage form wound, scant yellow/darkdrainage on dressing removed. Sutures are intact.  Medical Decision Making  Jacob [name] is a 66 y.o. male who presents s/p right transmetatarsal amputation on 01/01/17.  The patient's right transmetatarsal amputation closure incision has dehisced, exposing muscle and fat tissue, with fibrinous exudate.  Dr. Donnetta Hutching spoke with and examined pt. Po antibiotic not needed, site does not seem infected.  Continue silver alginate dressings.   Pt is scheduled to return on 02-22-17 with Dr. Trula Slade for 3 week follow up.    NICKEL, Sharmon Leyden, RN, MSN, FNP-C [facility] Office: [phone]  02/16/2017, 11:47 AM  Clinic MD: Early

## 2017-02-17 DIAGNOSIS — N2581 Secondary hyperparathyroidism of renal origin: Secondary | ICD-10-CM | POA: Diagnosis not present

## 2017-02-17 DIAGNOSIS — N186 End stage renal disease: Secondary | ICD-10-CM | POA: Diagnosis not present

## 2017-02-17 DIAGNOSIS — D631 Anemia in chronic kidney disease: Secondary | ICD-10-CM | POA: Diagnosis not present

## 2017-02-17 DIAGNOSIS — Z992 Dependence on renal dialysis: Secondary | ICD-10-CM | POA: Diagnosis not present

## 2017-02-18 ENCOUNTER — Emergency Department (HOSPITAL_COMMUNITY): Payer: Medicare Other

## 2017-02-18 ENCOUNTER — Encounter (HOSPITAL_COMMUNITY): Payer: Self-pay | Admitting: Emergency Medicine

## 2017-02-18 ENCOUNTER — Inpatient Hospital Stay (HOSPITAL_COMMUNITY)
Admission: EM | Admit: 2017-02-18 | Discharge: 2017-03-03 | DRG: 474 | Disposition: A | Discharge status: Skilled Nursing Facility | Payer: Medicare Other | Attending: Nephrology | Admitting: Nephrology

## 2017-02-18 ENCOUNTER — Other Ambulatory Visit: Payer: Self-pay | Admitting: *Deleted

## 2017-02-18 ENCOUNTER — Telehealth: Payer: Self-pay | Admitting: *Deleted

## 2017-02-18 DIAGNOSIS — Z992 Dependence on renal dialysis: Secondary | ICD-10-CM

## 2017-02-18 DIAGNOSIS — A4102 Sepsis due to Methicillin resistant Staphylococcus aureus: Secondary | ICD-10-CM | POA: Diagnosis present

## 2017-02-18 DIAGNOSIS — D62 Acute posthemorrhagic anemia: Secondary | ICD-10-CM | POA: Diagnosis not present

## 2017-02-18 DIAGNOSIS — K219 Gastro-esophageal reflux disease without esophagitis: Secondary | ICD-10-CM | POA: Diagnosis present

## 2017-02-18 DIAGNOSIS — D631 Anemia in chronic kidney disease: Secondary | ICD-10-CM | POA: Diagnosis present

## 2017-02-18 DIAGNOSIS — L97529 Non-pressure chronic ulcer of other part of left foot with unspecified severity: Secondary | ICD-10-CM | POA: Diagnosis not present

## 2017-02-18 DIAGNOSIS — B999 Unspecified infectious disease: Secondary | ICD-10-CM | POA: Diagnosis present

## 2017-02-18 DIAGNOSIS — T8743 Infection of amputation stump, right lower extremity: Principal | ICD-10-CM | POA: Diagnosis present

## 2017-02-18 DIAGNOSIS — K5900 Constipation, unspecified: Secondary | ICD-10-CM | POA: Diagnosis not present

## 2017-02-18 DIAGNOSIS — E11621 Type 2 diabetes mellitus with foot ulcer: Secondary | ICD-10-CM | POA: Diagnosis not present

## 2017-02-18 DIAGNOSIS — Z515 Encounter for palliative care: Secondary | ICD-10-CM | POA: Diagnosis not present

## 2017-02-18 DIAGNOSIS — E871 Hypo-osmolality and hyponatremia: Secondary | ICD-10-CM | POA: Diagnosis not present

## 2017-02-18 DIAGNOSIS — E559 Vitamin D deficiency, unspecified: Secondary | ICD-10-CM | POA: Diagnosis not present

## 2017-02-18 DIAGNOSIS — N401 Enlarged prostate with lower urinary tract symptoms: Secondary | ICD-10-CM | POA: Diagnosis not present

## 2017-02-18 DIAGNOSIS — D638 Anemia in other chronic diseases classified elsewhere: Secondary | ICD-10-CM | POA: Diagnosis present

## 2017-02-18 DIAGNOSIS — E1169 Type 2 diabetes mellitus with other specified complication: Secondary | ICD-10-CM | POA: Diagnosis present

## 2017-02-18 DIAGNOSIS — Z9115 Patient's noncompliance with renal dialysis: Secondary | ICD-10-CM

## 2017-02-18 DIAGNOSIS — I428 Other cardiomyopathies: Secondary | ICD-10-CM | POA: Diagnosis present

## 2017-02-18 DIAGNOSIS — D72829 Elevated white blood cell count, unspecified: Secondary | ICD-10-CM | POA: Diagnosis present

## 2017-02-18 DIAGNOSIS — Z882 Allergy status to sulfonamides status: Secondary | ICD-10-CM

## 2017-02-18 DIAGNOSIS — G40909 Epilepsy, unspecified, not intractable, without status epilepticus: Secondary | ICD-10-CM | POA: Diagnosis present

## 2017-02-18 DIAGNOSIS — Z880 Allergy status to penicillin: Secondary | ICD-10-CM | POA: Diagnosis not present

## 2017-02-18 DIAGNOSIS — Z87892 Personal history of anaphylaxis: Secondary | ICD-10-CM

## 2017-02-18 DIAGNOSIS — Y835 Amputation of limb(s) as the cause of abnormal reaction of the patient, or of later complication, without mention of misadventure at the time of the procedure: Secondary | ICD-10-CM | POA: Diagnosis present

## 2017-02-18 DIAGNOSIS — R7881 Bacteremia: Secondary | ICD-10-CM | POA: Diagnosis present

## 2017-02-18 DIAGNOSIS — Z7982 Long term (current) use of aspirin: Secondary | ICD-10-CM | POA: Diagnosis not present

## 2017-02-18 DIAGNOSIS — G9340 Encephalopathy, unspecified: Secondary | ICD-10-CM | POA: Diagnosis not present

## 2017-02-18 DIAGNOSIS — Z794 Long term (current) use of insulin: Secondary | ICD-10-CM

## 2017-02-18 DIAGNOSIS — D649 Anemia, unspecified: Secondary | ICD-10-CM | POA: Diagnosis not present

## 2017-02-18 DIAGNOSIS — I739 Peripheral vascular disease, unspecified: Secondary | ICD-10-CM | POA: Diagnosis not present

## 2017-02-18 DIAGNOSIS — M5126 Other intervertebral disc displacement, lumbar region: Secondary | ICD-10-CM | POA: Diagnosis not present

## 2017-02-18 DIAGNOSIS — N186 End stage renal disease: Secondary | ICD-10-CM | POA: Diagnosis present

## 2017-02-18 DIAGNOSIS — E1140 Type 2 diabetes mellitus with diabetic neuropathy, unspecified: Secondary | ICD-10-CM | POA: Diagnosis present

## 2017-02-18 DIAGNOSIS — Z66 Do not resuscitate: Secondary | ICD-10-CM | POA: Diagnosis not present

## 2017-02-18 DIAGNOSIS — Z91041 Radiographic dye allergy status: Secondary | ICD-10-CM

## 2017-02-18 DIAGNOSIS — T8189XD Other complications of procedures, not elsewhere classified, subsequent encounter: Secondary | ICD-10-CM | POA: Diagnosis not present

## 2017-02-18 DIAGNOSIS — I96 Gangrene, not elsewhere classified: Secondary | ICD-10-CM | POA: Diagnosis not present

## 2017-02-18 DIAGNOSIS — I639 Cerebral infarction, unspecified: Secondary | ICD-10-CM | POA: Diagnosis not present

## 2017-02-18 DIAGNOSIS — E1122 Type 2 diabetes mellitus with diabetic chronic kidney disease: Secondary | ICD-10-CM | POA: Diagnosis not present

## 2017-02-18 DIAGNOSIS — I10 Essential (primary) hypertension: Secondary | ICD-10-CM | POA: Diagnosis not present

## 2017-02-18 DIAGNOSIS — R627 Adult failure to thrive: Secondary | ICD-10-CM | POA: Diagnosis present

## 2017-02-18 DIAGNOSIS — M869 Osteomyelitis, unspecified: Secondary | ICD-10-CM | POA: Diagnosis present

## 2017-02-18 DIAGNOSIS — E119 Type 2 diabetes mellitus without complications: Secondary | ICD-10-CM

## 2017-02-18 DIAGNOSIS — B9562 Methicillin resistant Staphylococcus aureus infection as the cause of diseases classified elsewhere: Secondary | ICD-10-CM | POA: Diagnosis not present

## 2017-02-18 DIAGNOSIS — I120 Hypertensive chronic kidney disease with stage 5 chronic kidney disease or end stage renal disease: Secondary | ICD-10-CM | POA: Diagnosis present

## 2017-02-18 DIAGNOSIS — Z91030 Bee allergy status: Secondary | ICD-10-CM

## 2017-02-18 DIAGNOSIS — I2510 Atherosclerotic heart disease of native coronary artery without angina pectoris: Secondary | ICD-10-CM | POA: Diagnosis present

## 2017-02-18 DIAGNOSIS — T8131XD Disruption of external operation (surgical) wound, not elsewhere classified, subsequent encounter: Secondary | ICD-10-CM | POA: Diagnosis not present

## 2017-02-18 DIAGNOSIS — T8781 Dehiscence of amputation stump: Secondary | ICD-10-CM | POA: Diagnosis present

## 2017-02-18 DIAGNOSIS — E1152 Type 2 diabetes mellitus with diabetic peripheral angiopathy with gangrene: Secondary | ICD-10-CM | POA: Diagnosis present

## 2017-02-18 DIAGNOSIS — F418 Other specified anxiety disorders: Secondary | ICD-10-CM | POA: Diagnosis not present

## 2017-02-18 DIAGNOSIS — H04129 Dry eye syndrome of unspecified lacrimal gland: Secondary | ICD-10-CM | POA: Diagnosis not present

## 2017-02-18 DIAGNOSIS — Z818 Family history of other mental and behavioral disorders: Secondary | ICD-10-CM

## 2017-02-18 DIAGNOSIS — Z419 Encounter for procedure for purposes other than remedying health state, unspecified: Secondary | ICD-10-CM

## 2017-02-18 DIAGNOSIS — I771 Stricture of artery: Secondary | ICD-10-CM | POA: Diagnosis present

## 2017-02-18 DIAGNOSIS — Z89511 Acquired absence of right leg below knee: Secondary | ICD-10-CM | POA: Diagnosis not present

## 2017-02-18 DIAGNOSIS — G92 Toxic encephalopathy: Secondary | ICD-10-CM | POA: Diagnosis not present

## 2017-02-18 DIAGNOSIS — S88019A Complete traumatic amputation at knee level, unspecified lower leg, initial encounter: Secondary | ICD-10-CM | POA: Diagnosis not present

## 2017-02-18 DIAGNOSIS — G8918 Other acute postprocedural pain: Secondary | ICD-10-CM | POA: Diagnosis not present

## 2017-02-18 DIAGNOSIS — A4902 Methicillin resistant Staphylococcus aureus infection, unspecified site: Secondary | ICD-10-CM | POA: Diagnosis not present

## 2017-02-18 DIAGNOSIS — D500 Iron deficiency anemia secondary to blood loss (chronic): Secondary | ICD-10-CM | POA: Diagnosis not present

## 2017-02-18 DIAGNOSIS — I70261 Atherosclerosis of native arteries of extremities with gangrene, right leg: Secondary | ICD-10-CM | POA: Diagnosis not present

## 2017-02-18 DIAGNOSIS — I719 Aortic aneurysm of unspecified site, without rupture: Secondary | ICD-10-CM | POA: Diagnosis not present

## 2017-02-18 DIAGNOSIS — I420 Dilated cardiomyopathy: Secondary | ICD-10-CM | POA: Diagnosis not present

## 2017-02-18 DIAGNOSIS — Z8249 Family history of ischemic heart disease and other diseases of the circulatory system: Secondary | ICD-10-CM

## 2017-02-18 DIAGNOSIS — M6281 Muscle weakness (generalized): Secondary | ICD-10-CM | POA: Diagnosis not present

## 2017-02-18 DIAGNOSIS — T814XXA Infection following a procedure, initial encounter: Secondary | ICD-10-CM | POA: Diagnosis not present

## 2017-02-18 DIAGNOSIS — Z821 Family history of blindness and visual loss: Secondary | ICD-10-CM

## 2017-02-18 DIAGNOSIS — F319 Bipolar disorder, unspecified: Secondary | ICD-10-CM | POA: Diagnosis present

## 2017-02-18 DIAGNOSIS — Z7902 Long term (current) use of antithrombotics/antiplatelets: Secondary | ICD-10-CM

## 2017-02-18 DIAGNOSIS — L089 Local infection of the skin and subcutaneous tissue, unspecified: Secondary | ICD-10-CM | POA: Diagnosis present

## 2017-02-18 DIAGNOSIS — L97919 Non-pressure chronic ulcer of unspecified part of right lower leg with unspecified severity: Secondary | ICD-10-CM | POA: Diagnosis not present

## 2017-02-18 DIAGNOSIS — Z833 Family history of diabetes mellitus: Secondary | ICD-10-CM

## 2017-02-18 DIAGNOSIS — T148XXD Other injury of unspecified body region, subsequent encounter: Secondary | ICD-10-CM | POA: Diagnosis not present

## 2017-02-18 DIAGNOSIS — M4640 Discitis, unspecified, site unspecified: Secondary | ICD-10-CM | POA: Diagnosis present

## 2017-02-18 DIAGNOSIS — E785 Hyperlipidemia, unspecified: Secondary | ICD-10-CM | POA: Diagnosis not present

## 2017-02-18 DIAGNOSIS — T148XXA Other injury of unspecified body region, initial encounter: Secondary | ICD-10-CM

## 2017-02-18 DIAGNOSIS — E11628 Type 2 diabetes mellitus with other skin complications: Secondary | ICD-10-CM | POA: Diagnosis not present

## 2017-02-18 DIAGNOSIS — N2581 Secondary hyperparathyroidism of renal origin: Secondary | ICD-10-CM | POA: Diagnosis present

## 2017-02-18 DIAGNOSIS — Z8673 Personal history of transient ischemic attack (TIA), and cerebral infarction without residual deficits: Secondary | ICD-10-CM

## 2017-02-18 DIAGNOSIS — L97519 Non-pressure chronic ulcer of other part of right foot with unspecified severity: Secondary | ICD-10-CM | POA: Diagnosis not present

## 2017-02-18 HISTORY — DX: End stage renal disease: N18.6

## 2017-02-18 HISTORY — DX: Dependence on renal dialysis: Z99.2

## 2017-02-18 LAB — CBC WITH DIFFERENTIAL/PLATELET
BASOS PCT: 0 %
Basophils Absolute: 0 10*3/uL (ref 0.0–0.1)
Eosinophils Absolute: 0.2 10*3/uL (ref 0.0–0.7)
Eosinophils Relative: 1 %
HEMATOCRIT: 26.6 % — AB (ref 39.0–52.0)
HEMOGLOBIN: 8 g/dL — AB (ref 13.0–17.0)
LYMPHS ABS: 1.5 10*3/uL (ref 0.7–4.0)
LYMPHS PCT: 7 %
MCH: 25.5 pg — AB (ref 26.0–34.0)
MCHC: 30.1 g/dL (ref 30.0–36.0)
MCV: 84.7 fL (ref 78.0–100.0)
MONOS PCT: 7 %
Monocytes Absolute: 1.5 10*3/uL — ABNORMAL HIGH (ref 0.1–1.0)
NEUTROS ABS: 17.6 10*3/uL — AB (ref 1.7–7.7)
Neutrophils Relative %: 85 %
Platelets: 353 10*3/uL (ref 150–400)
RBC: 3.14 MIL/uL — ABNORMAL LOW (ref 4.22–5.81)
RDW: 17 % — ABNORMAL HIGH (ref 11.5–15.5)
WBC: 20.8 10*3/uL — ABNORMAL HIGH (ref 4.0–10.5)

## 2017-02-18 LAB — GLUCOSE, CAPILLARY: GLUCOSE-CAPILLARY: 153 mg/dL — AB (ref 65–99)

## 2017-02-18 LAB — CBC
HCT: 25.6 % — ABNORMAL LOW (ref 39.0–52.0)
HEMOGLOBIN: 7.8 g/dL — AB (ref 13.0–17.0)
MCH: 25.2 pg — ABNORMAL LOW (ref 26.0–34.0)
MCHC: 30.5 g/dL (ref 30.0–36.0)
MCV: 82.8 fL (ref 78.0–100.0)
Platelets: 368 10*3/uL (ref 150–400)
RBC: 3.09 MIL/uL — ABNORMAL LOW (ref 4.22–5.81)
RDW: 17.5 % — ABNORMAL HIGH (ref 11.5–15.5)
WBC: 19.4 10*3/uL — AB (ref 4.0–10.5)

## 2017-02-18 LAB — COMPREHENSIVE METABOLIC PANEL
ALBUMIN: 2.2 g/dL — AB (ref 3.5–5.0)
ALK PHOS: 184 U/L — AB (ref 38–126)
ALT: 9 U/L — ABNORMAL LOW (ref 17–63)
ANION GAP: 12 (ref 5–15)
AST: 19 U/L (ref 15–41)
BUN: 24 mg/dL — AB (ref 6–20)
CO2: 25 mmol/L (ref 22–32)
Calcium: 8.5 mg/dL — ABNORMAL LOW (ref 8.9–10.3)
Chloride: 94 mmol/L — ABNORMAL LOW (ref 101–111)
Creatinine, Ser: 6.23 mg/dL — ABNORMAL HIGH (ref 0.61–1.24)
GFR calc Af Amer: 10 mL/min — ABNORMAL LOW (ref >60)
GFR calc non Af Amer: 8 mL/min — ABNORMAL LOW (ref >60)
GLUCOSE: 175 mg/dL — AB (ref 65–99)
POTASSIUM: 3.8 mmol/L (ref 3.5–5.1)
SODIUM: 131 mmol/L — AB (ref 135–145)
Total Bilirubin: 0.4 mg/dL (ref 0.3–1.2)
Total Protein: 8.1 g/dL (ref 6.5–8.1)

## 2017-02-18 LAB — I-STAT CG4 LACTIC ACID, ED: Lactic Acid, Venous: 1.9 mmol/L (ref 0.5–1.9)

## 2017-02-18 MED ORDER — HYDROMORPHONE HCL 1 MG/ML IJ SOLN
1.0000 mg | Freq: Once | INTRAMUSCULAR | Status: AC
  Administered 2017-02-18: 1 mg via INTRAVENOUS
  Filled 2017-02-18: qty 1

## 2017-02-18 MED ORDER — INSULIN ASPART 100 UNIT/ML ~~LOC~~ SOLN: 0.0000 [IU] | Freq: Every day | SUBCUTANEOUS | Status: DC

## 2017-02-18 MED ORDER — SODIUM CHLORIDE 0.9 % IV SOLN
250.0000 mL | INTRAVENOUS | Status: DC | PRN
  Administered 2017-02-28 (×2): via INTRAVENOUS

## 2017-02-18 MED ORDER — OXYCODONE-ACETAMINOPHEN 5-325 MG PO TABS
1.0000 | ORAL_TABLET | ORAL | Status: DC | PRN
  Administered 2017-02-19 – 2017-02-20 (×6): 1 via ORAL
  Filled 2017-02-18 (×5): qty 1

## 2017-02-18 MED ORDER — HEPARIN SODIUM (PORCINE) 1000 UNIT/ML DIALYSIS
40.0000 [IU]/kg | INTRAMUSCULAR | Status: DC | PRN
  Filled 2017-02-18: qty 6

## 2017-02-18 MED ORDER — CLINDAMYCIN PHOSPHATE 600 MG/50ML IV SOLN
600.0000 mg | Freq: Once | INTRAVENOUS | Status: AC
  Administered 2017-02-18: 600 mg via INTRAVENOUS
  Filled 2017-02-18: qty 50

## 2017-02-18 MED ORDER — DIVALPROEX SODIUM 500 MG PO DR TAB
500.0000 mg | DELAYED_RELEASE_TABLET | Freq: Two times a day (BID) | ORAL | Status: DC
  Administered 2017-02-19: 500 mg via ORAL
  Filled 2017-02-18 (×2): qty 2
  Filled 2017-02-18 (×2): qty 1

## 2017-02-18 MED ORDER — VANCOMYCIN HCL IN DEXTROSE 1-5 GM/200ML-% IV SOLN
1000.0000 mg | INTRAVENOUS | Status: DC
  Administered 2017-02-19 – 2017-02-26 (×4): 1000 mg via INTRAVENOUS
  Administered 2017-03-01: 1 g via INTRAVENOUS
  Administered 2017-03-03: 1000 mg via INTRAVENOUS
  Filled 2017-02-18 (×6): qty 200

## 2017-02-18 MED ORDER — ATORVASTATIN CALCIUM 40 MG PO TABS
40.0000 mg | ORAL_TABLET | Freq: Every day | ORAL | Status: DC
  Administered 2017-02-19 – 2017-03-02 (×12): 40 mg via ORAL
  Filled 2017-02-18 (×13): qty 1

## 2017-02-18 MED ORDER — INSULIN ASPART 100 UNIT/ML ~~LOC~~ SOLN
0.0000 [IU] | Freq: Three times a day (TID) | SUBCUTANEOUS | Status: DC
  Administered 2017-02-19 – 2017-02-20 (×3): 1 [IU] via SUBCUTANEOUS
  Administered 2017-02-20: 2 [IU] via SUBCUTANEOUS
  Administered 2017-02-21 – 2017-02-23 (×3): 1 [IU] via SUBCUTANEOUS
  Administered 2017-02-23: 2 [IU] via SUBCUTANEOUS
  Administered 2017-02-24: 3 [IU] via SUBCUTANEOUS
  Administered 2017-02-24 – 2017-02-27 (×7): 2 [IU] via SUBCUTANEOUS
  Administered 2017-02-27 – 2017-02-28 (×2): 1 [IU] via SUBCUTANEOUS
  Administered 2017-02-28: 2 [IU] via SUBCUTANEOUS
  Administered 2017-03-01 – 2017-03-02 (×3): 1 [IU] via SUBCUTANEOUS
  Administered 2017-03-03: 2 [IU] via SUBCUTANEOUS

## 2017-02-18 MED ORDER — INSULIN GLARGINE 100 UNIT/ML ~~LOC~~ SOLN
20.0000 [IU] | Freq: Every day | SUBCUTANEOUS | Status: DC
  Filled 2017-02-18: qty 0.2

## 2017-02-18 MED ORDER — BISACODYL 5 MG PO TBEC
10.0000 mg | DELAYED_RELEASE_TABLET | Freq: Every day | ORAL | Status: DC | PRN
  Administered 2017-02-20 – 2017-02-27 (×3): 10 mg via ORAL
  Filled 2017-02-18 (×3): qty 2

## 2017-02-18 MED ORDER — ONDANSETRON HCL 4 MG/2ML IJ SOLN: 4.0000 mg | Freq: Four times a day (QID) | INTRAMUSCULAR | Status: DC | PRN

## 2017-02-18 MED ORDER — PANTOPRAZOLE SODIUM 40 MG PO TBEC
40.0000 mg | DELAYED_RELEASE_TABLET | Freq: Every day | ORAL | Status: DC
  Administered 2017-02-19 – 2017-03-03 (×12): 40 mg via ORAL
  Filled 2017-02-18 (×13): qty 1

## 2017-02-18 MED ORDER — CIPROFLOXACIN IN D5W 400 MG/200ML IV SOLN
400.0000 mg | Freq: Once | INTRAVENOUS | Status: AC
  Administered 2017-02-18: 400 mg via INTRAVENOUS
  Filled 2017-02-18: qty 200

## 2017-02-18 MED ORDER — HEPARIN SODIUM (PORCINE) 5000 UNIT/ML IJ SOLN
5000.0000 [IU] | Freq: Three times a day (TID) | INTRAMUSCULAR | Status: DC
  Administered 2017-02-19 – 2017-02-28 (×23): 5000 [IU] via SUBCUTANEOUS
  Filled 2017-02-18 (×26): qty 1

## 2017-02-18 MED ORDER — DEXTROSE 5 % IV SOLN
2.0000 g | INTRAVENOUS | Status: DC
  Administered 2017-02-19: 2 g via INTRAVENOUS
  Filled 2017-02-18 (×2): qty 2

## 2017-02-18 MED ORDER — SODIUM CHLORIDE 0.9% FLUSH: 3.0000 mL | INTRAVENOUS | Status: DC | PRN

## 2017-02-18 MED ORDER — ACETAMINOPHEN 500 MG PO TABS
1000.0000 mg | ORAL_TABLET | Freq: Four times a day (QID) | ORAL | Status: DC | PRN
  Administered 2017-02-20 (×2): 1000 mg via ORAL
  Filled 2017-02-18 (×2): qty 2

## 2017-02-18 MED ORDER — LISINOPRIL 20 MG PO TABS: 20.0000 mg | ORAL_TABLET | Freq: Every day | ORAL | Status: DC

## 2017-02-18 MED ORDER — ONDANSETRON HCL 4 MG PO TABS: 4.0000 mg | ORAL_TABLET | Freq: Four times a day (QID) | ORAL | Status: DC | PRN

## 2017-02-18 MED ORDER — SODIUM CHLORIDE 0.9% FLUSH
3.0000 mL | Freq: Two times a day (BID) | INTRAVENOUS | Status: DC
  Administered 2017-02-18 – 2017-03-02 (×22): 3 mL via INTRAVENOUS

## 2017-02-18 MED ORDER — ASPIRIN EC 325 MG PO TBEC
325.0000 mg | DELAYED_RELEASE_TABLET | Freq: Every day | ORAL | Status: DC
  Administered 2017-02-19 – 2017-03-03 (×12): 325 mg via ORAL
  Filled 2017-02-18 (×12): qty 1

## 2017-02-18 MED ORDER — RENA-VITE PO TABS
1.0000 | ORAL_TABLET | ORAL | Status: DC
  Administered 2017-02-19 – 2017-03-01 (×5): 1 via ORAL
  Filled 2017-02-18 (×5): qty 1

## 2017-02-18 MED ORDER — SEVELAMER CARBONATE 800 MG PO TABS: 800.0000 mg | ORAL_TABLET | Freq: Three times a day (TID) | ORAL | Status: DC

## 2017-02-18 MED ORDER — VANCOMYCIN HCL 10 G IV SOLR
2000.0000 mg | Freq: Once | INTRAVENOUS | Status: AC
  Administered 2017-02-18: 2000 mg via INTRAVENOUS
  Filled 2017-02-18: qty 2000

## 2017-02-18 MED ORDER — CINACALCET HCL 30 MG PO TABS
30.0000 mg | ORAL_TABLET | Freq: Every day | ORAL | Status: DC
  Administered 2017-02-19 – 2017-03-03 (×10): 30 mg via ORAL
  Filled 2017-02-18 (×12): qty 1

## 2017-02-18 MED ORDER — CLOPIDOGREL BISULFATE 75 MG PO TABS
75.0000 mg | ORAL_TABLET | Freq: Every day | ORAL | Status: DC
  Administered 2017-02-19 – 2017-03-03 (×12): 75 mg via ORAL
  Filled 2017-02-18 (×13): qty 1

## 2017-02-18 MED ORDER — DEXTROMETHORPHAN POLISTIREX ER 30 MG/5ML PO SUER
30.0000 mg | Freq: Two times a day (BID) | ORAL | Status: DC | PRN
  Filled 2017-02-18: qty 5

## 2017-02-18 MED ORDER — CARVEDILOL 12.5 MG PO TABS: 6.2500 mg | ORAL_TABLET | Freq: Two times a day (BID) | ORAL | Status: DC

## 2017-02-18 MED ORDER — LORATADINE 10 MG PO TABS
10.0000 mg | ORAL_TABLET | Freq: Every day | ORAL | Status: DC | PRN
  Administered 2017-02-23: 10 mg via ORAL
  Filled 2017-02-18: qty 1

## 2017-02-18 MED ORDER — METRONIDAZOLE IN NACL 5-0.79 MG/ML-% IV SOLN
500.0000 mg | Freq: Three times a day (TID) | INTRAVENOUS | Status: DC
  Administered 2017-02-19 (×2): 500 mg via INTRAVENOUS
  Filled 2017-02-18 (×5): qty 100

## 2017-02-18 NOTE — Consult Note (Signed)
Hospital Consult    Reason for Consult:  Right foot non healing Referring Physician:  ED MRN #:  [id number]  History of Present Illness: This is a 66 y.o. male with diabetes and end-stage renal disease on dialysis Monday Wednesday Friday via right upper extremity AV fistula. He has recently undergone revascularization of a right lower extremity with balloon angioplasty of his posterior tibial and anterior tibial arteries as well as drug-coated balloon angioplasty of his right popliteal artery. He subsequent underwent a transmetatarsal amputation on the right which has had some healing difficulty. Most recently he was seen on the 17th of this month in our office for concern of nonhealing. He has been followed at the [facility] with silver alginate dressings. He now presents to the emergency department with persistent pain of his entire right lower extremity. He states that he has not had fevers at home. He is also not feeling well and not taking much by mouth. He has had significantly more drainage from the wound. He still has his sutures intact and is scheduled to see Dr. Trula Slade next week.  Past Medical History:  Diagnosis Date  . Anemia   . Anemia in chronic kidney disease(285.21)   . Anxiety   . Arthritis   . Depression   . Dyspnea    with exertion  . End stage renal disease ([place])    Hemodialysis on Monday, Wednesday, Friday- in [place]  . Essential hypertension   . GERD (gastroesophageal reflux disease)   . Glaucoma   . History of blood transfusion   . Insomnia   . Nonischemic cardiomyopathy ([place])   . Stroke [facility]) 2016   - "they said it was not a stroke"  . Type 2 diabetes mellitus ([place])    Type II    Past Surgical History:  Procedure Laterality Date  . A/V SHUNTOGRAM Right 10/06/2016   Procedure: A/V Fistulagram;  Surgeon: [name], MD;  Location: [place] [facility];  Service: Cardiovascular;  Laterality: Right;  . ABDOMINAL AORTOGRAM N/A 12/30/2016   Procedure:  Abdominal Aortogram;  Surgeon: Waynetta Sandy, MD;  Location: [place] [facility];  Service: Cardiovascular;  Laterality: N/A;  . AV FISTULA PLACEMENT     Hx: of  . AV FISTULA PLACEMENT Right 05/04/2013   Procedure: ARTERIOVENOUS (AV) FISTULA CREATION- RIGHT ARM;  Surgeon: Conrad Solen, MD;  Location: [place];  Service: Vascular;  Laterality: Right;  Ultrasound guided  . AV FISTULA PLACEMENT Right 07/28/2016   Procedure: BRACHIOCEPHALIC ARTERIOVENOUS (AV) FISTULA CREATION;  Surgeon: [name], MD;  Location: [place];  Service: Vascular;  Laterality: Right;  . CATARACT EXTRACTION W/PHACO Left 07/16/2014   Procedure: CATARACT EXTRACTION PHACO AND INTRAOCULAR LENS PLACEMENT (IOC);  Surgeon: [name], MD;  Location: [facility];  Service: Ophthalmology;  Laterality: Left;  CDE:8.86  . CATARACT EXTRACTION W/PHACO Right 07/30/2014   Procedure: CATARACT EXTRACTION PHACO AND INTRAOCULAR LENS PLACEMENT (IOC);  Surgeon: [name], MD;  Location: [facility];  Service: Ophthalmology;  Laterality: Right;  CDE 8.99  . COLONOSCOPY     Hx: of  . FISTULA SUPERFICIALIZATION Right 07/28/2016   Procedure: FISTULA SUPERFICIALIZATION;  Surgeon: [name], MD;  Location: [place];  Service: Vascular;  Laterality: Right;  . FISTULA SUPERFICIALIZATION Right 10/13/2016   Procedure: BRACHIOCEPHALIC ARTERIOVENOUS FISTULA SUPERFICIALIZATION;  Surgeon: [name], MD;  Location: [place];  Service: Vascular;  Laterality: Right;  . FISTULOGRAM N/A 05/04/2013   Procedure: CENTRAL VENOGRAM;  Surgeon: Jannette Fogo  Bridgett Larsson, MD;  Location: [place];  Service: Vascular;  Laterality: N/A;  . INSERTION OF DIALYSIS CATHETER Right 05/04/2013   Procedure: INSERTION OF DIALYSIS CATHETER;  Surgeon: Conrad Adair, MD;  Location: [place];  Service: Vascular;  Laterality: Right;  Ultrasound guided  . INSERTION OF DIALYSIS CATHETER N/A 07/28/2016   Procedure: INSERTION OF DIALYSIS CATHETER;  Surgeon: [name], MD;   Location: [place];  Service: Vascular;  Laterality: N/A;  . LIGATION OF ARTERIOVENOUS  FISTULA Left 05/04/2013   Procedure: LIGATION OF LEFT RADIAL CEPHALIC ARTERIOVENOUS  FISTULA;  Surgeon: Conrad [place], MD;  Location: [place];  Service: Vascular;  Laterality: Left;  Ultrasound guided  . LIGATION OF ARTERIOVENOUS  FISTULA Right 07/28/2016   Procedure: LIGATION OF RADIOCEPHALIC ARTERIOVENOUS  FISTULA;  Surgeon: [name], MD;  Location: [place];  Service: Vascular;  Laterality: Right;  . LOWER EXTREMITY ANGIOGRAPHY N/A 12/30/2016   Procedure: Lower Extremity Angiography;  Surgeon: Waynetta Sandy, MD;  Location: [place] [facility];  Service: Cardiovascular;  Laterality: N/A;  . LUMBAR SPINE SURGERY    . PERIPHERAL VASCULAR ATHERECTOMY Right 12/30/2016   Procedure: Peripheral Vascular Atherectomy;  Surgeon: Waynetta Sandy, MD;  Location: [place] [facility];  Service: Cardiovascular;  Laterality: Right;  AT and PT  . PERIPHERAL VASCULAR BALLOON ANGIOPLASTY Right 12/30/2016   Procedure: Peripheral Vascular Balloon Angioplasty;  Surgeon: Waynetta Sandy, MD;  Location: [place] [facility];  Service: Cardiovascular;  Laterality: Right;  PTA of PT and AT  . PERIPHERAL VASCULAR CATHETERIZATION N/A 01/24/2015   Procedure: Fistulagram;  Surgeon: Conrad [place], MD;  Location: [place] [facility];  Service: Cardiovascular;  Laterality: N/A;  . PERIPHERAL VASCULAR CATHETERIZATION Right 06/04/2016   Procedure: A/V Shuntogram/Fistulagram;  Surgeon: Conrad [place], MD;  Location: [place] [facility];  Service: Cardiovascular;  Laterality: Right;  . REVISON OF ARTERIOVENOUS FISTULA Right 01/02/2014   Procedure: REVISON OF ARTERIOVENOUS FISTULA ANASTOMOSIS;  Surgeon: Conrad , MD;  Location: [place];  Service: Vascular;  Laterality: Right;  . REVISON OF ARTERIOVENOUS FISTULA Right 01/02/2016   Procedure: REVISION OF RADIOCEPHALIC ARTERIOVENOUS FISTULA  with BOVINE PATCH ANGIOPLASTY RIGHT  RADIAL ARTERY;  Surgeon: Mal [name], MD;  Location: [place];  Service: Vascular;  Laterality: Right;  . SHUNTOGRAM N/A 11/07/2013   Procedure: Fistulogram;  Surgeon: [name], MD;  Location: [facility];  Service: Cardiovascular;  Laterality: N/A;  . TRANSMETATARSAL AMPUTATION Right 01/01/2017   Procedure: TRANSMETATARSAL AMPUTATION;  Surgeon: [name], MD;  Location: [place];  Service: Vascular;  Laterality: Right;    Allergies  Allergen Reactions  . Bee Venom Anaphylaxis  . Penicillins Swelling and Other (See Comments)    SWELLING REACTION UNSPECIFIED   Has patient had a PCN reaction causing immediate rash, facial/tongue/throat swelling, SOB or lightheadedness with hypotension: No Has patient had a PCN reaction causing severe rash involving mucus membranes or skin necrosis: No Has patient had a PCN reaction that required hospitalization No Has patient had a PCN reaction occurring within the last 10 years: No If all of the above answers are "NO", then may proceed with Cephalosporin use.  . Sulfadiazine Other (See Comments)    1% Silver Sulfadiazine cream causes burning over a large area of skin.    Prior to Admission medications   Medication Sig Start Date End Date Taking? Authorizing Provider  acetaminophen (TYLENOL) 500 MG tablet Take 1,000 mg by mouth every 6 (six) hours  as needed for mild pain.    [provider]  aspirin EC 325 MG tablet Take 325 mg by mouth daily.    [provider]  atorvastatin (LIPITOR) 40 MG tablet Take 40 mg by mouth daily.    [provider]  B Complex-C-Folic Acid (DIALYVITE TABLET) TABS Take 1 tablet by mouth every Monday, Wednesday, and Friday.     [provider]  bisacodyl (DULCOLAX) 5 MG EC tablet Take 2 tablets (10 mg total) by mouth daily as needed for moderate constipation. 01/04/17   Cristal Ford, DO  carvedilol (COREG) 6.25 MG tablet Take 6.25 mg by mouth 2 (two) times daily with a meal. Take 6.5mg s  once daily on dialysis days, Mondays, Wednesdays, and Fridays    [provider]  cinacalcet (SENSIPAR) 30 MG tablet Take 30 mg by mouth daily.    [provider]  clopidogrel (PLAVIX) 75 MG tablet Take 1 tablet (75 mg total) by mouth daily with breakfast. 01/05/17   Cristal Ford, DO  dextromethorphan (ROBITUSSIN 12 HOUR COUGH) 30 MG/5ML liquid Take by mouth as needed for cough.    [provider]  divalproex (DEPAKOTE) 500 MG DR tablet Take 1 tablet (500 mg total) by mouth every 12 (twelve) hours. 01/04/17   Mikhail, Velta Addison, DO  EPIPEN 2-PAK 0.3 MG/0.3ML SOAJ injection Inject 0.3 mg into the muscle once as needed (For anaphylaxis.).  04/18/13   [provider]  LANTUS SOLOSTAR 100 UNIT/ML Solostar Pen Inject 20 Units into the skin at bedtime. If blood sugar is lower than 150 only inject 20 units at bedtime 01/15/15   [provider]  lisinopril (PRINIVIL,ZESTRIL) 20 MG tablet Take 20 mg by mouth daily.    [provider]  loratadine (CLARITIN) 10 MG tablet Take 10 mg by mouth daily as needed for allergies.    [provider]  omeprazole (PRILOSEC) 40 MG capsule Take 40 mg by mouth daily.     [provider]  oxyCODONE-acetaminophen (PERCOCET/ROXICET) 5-325 MG tablet Take 1 tablet by mouth every 4 (four) hours as needed for moderate pain. 01/04/17   Mikhail, Velta Addison, DO  ramipril (ALTACE) 10 MG capsule Take 10 mg by mouth daily.     [provider]  RENVELA 800 MG tablet Take 800-2,400 mg by mouth 3 (three) times daily with meals. Take 2400 mg by mouth 3 times daily with meals and 800 mg by mouth with snacks 05/25/13   [provider]    Social History   Social History  . Marital status: Single    Spouse name: N/A  . Number of children: N/A  . Years of education: N/A   Occupational History  . Not on file.   Social History Main Topics  . Smoking status: Never Smoker  . Smokeless tobacco: Never Used  .  Alcohol use Yes     Comment: 1- fifth of gin a week  . Drug use: No  . Sexual activity: Yes    Birth control/ protection: None   Other Topics Concern  . Not on file   Social History Narrative  . No narrative on file     Family History  Problem Relation Age of Onset  . Heart attack Brother        28  . Heart disease Brother        before age 70  . Hypertension Brother   . Heart attack Brother        65  . Heart attack  Brother        83  . Diabetes Mother   . Hypertension Mother   . Heart disease Mother   . Heart disease Father   . Hypertension Father   . Other Father        amputation  . Hypertension Sister   . Heart disease Sister   . Vision loss Maternal Uncle     REVIEW OF SYSTEMS (negative unless checked):   Cardiac:  []  Chest pain or chest pressure? []  Shortness of breath upon activity? []  Shortness of breath when lying flat? []  Irregular heart rhythm?  Vascular:  []  Pain in calf, thigh, or hip brought on by walking? []  Pain in feet at night that wakes you up from your sleep? []  Blood clot in your veins? []  Leg swelling?  Pulmonary:  []  Oxygen at home? []  Productive cough? []  Wheezing?  Neurologic:  []  Sudden weakness in arms or legs? []  Sudden numbness in arms or legs? []  Sudden onset of difficult speaking or slurred speech? []  Temporary loss of vision in one eye? []  Problems with dizziness?  Gastrointestinal:  []  Blood in stool? []  Vomited blood?  Genitourinary:  []  Burning when urinating? []  Blood in urine?  Psychiatric:  []  Major depression  Hematologic:  []  Bleeding problems? []  Problems with blood clotting?  Dermatologic:  [x]  wound drainage  Constitutional:  []  Fever or chills?  Ear/Nose/Throat:  []  Change in hearing? []  Nose bleeds? []  Sore throat?  Musculoskeletal:  []  Back pain? []  Joint pain? [x]  right leg pain  Physical Examination  Vitals:   02/18/17 2019 02/18/17 2045  BP: 136/64 (!) 124/55  Pulse: 67  81  Resp: 18   Temp:     There is no height or weight on file to calculate BMI.  General:  WDWN in NAD HENT: WNL, normocephalic Pulmonary: normal non-labored breathing Cardiac: palpable femoral and popliteal pulses Abdomen: soft, NT/ND, no masses Extremities: right tma site dehisced with yellow drainage, sutures remain in place, no erythema Neurologic: A&O X 3; SENSATION: normal; MOTOR FUNCTION:  moving all extremities equally. Speech is fluent/normal Psychiatric:  Appropriate mood and affect  CBC    Component Value Date/Time   WBC 20.8 (H) 02/18/2017 1821   RBC 3.14 (L) 02/18/2017 1821   HGB 8.0 (L) 02/18/2017 1821   HCT 26.6 (L) 02/18/2017 1821   PLT 353 02/18/2017 1821   MCV 84.7 02/18/2017 1821   MCH 25.5 (L) 02/18/2017 1821   MCHC 30.1 02/18/2017 1821   RDW 17.0 (H) 02/18/2017 1821   LYMPHSABS 1.5 02/18/2017 1821   MONOABS 1.5 (H) 02/18/2017 1821   EOSABS 0.2 02/18/2017 1821   BASOSABS 0.0 02/18/2017 1821    BMET    Component Value Date/Time   NA 131 (L) 02/18/2017 1821   K 3.8 02/18/2017 1821   CL 94 (L) 02/18/2017 1821   CO2 25 02/18/2017 1821   GLUCOSE 175 (H) 02/18/2017 1821   BUN 24 (H) 02/18/2017 1821   CREATININE 6.23 (H) 02/18/2017 1821   CALCIUM 8.5 (L) 02/18/2017 1821   GFRNONAA 8 (L) 02/18/2017 1821   GFRAA 10 (L) 02/18/2017 1821    COAGS: Lab Results  Component Value Date   INR 1.30 12/30/2016       ASSESSMENT/PLAN: This is a 66 y.o. male status post right transmetatarsal amputation after endovascular intervention of his right lower extremity. He remains on aspirin/ plavix and statin and should continue those. Given his leukocytosis he probably meds antibiotics at this time.  We discussed more proximal amputation tonight and he will likely need this during this admission.  It is possible that we can revise his transmetatarsal amputation in attempt to salvage his foot given that he is still ambulatory.  Will discuss options further with him  tomorrow.  Saleem Coccia C. Donzetta Matters, MD [facility] Office: [phone] Pager: [phone]

## 2017-02-18 NOTE — Telephone Encounter (Signed)
I spoke to Jacob [name] and [name] from [facility], about his burning pain and foot swelling. According to Maudie [name], Jacob [name]'s transmetatarsal wound is slightly more open with increased amount of white slough, has burning sensation and erythema in the upper leg. His right foot is warm to touch per Maudie [name]. Patient also is reporting a "knot" in his right groin that "has gone down from the size of a egg to the size of a quarter now". He also states that he had chills and sweating this morning. Maudie [name] said his temp just now was within normal range. Maudie [name] said that she has told the patient to go to the ED for evaluation of this problem several times but the patient has refused. I also told Jacob [name] to be evaluated in the ED.

## 2017-02-18 NOTE — Progress Notes (Signed)
Pharmacy Antibiotic Note  Jacob [name] is a 66 y.o. male admitted on 02/18/2017 with cellulitis.  Pharmacy has been consulted for vancomycin dosing. Pt is afebrile but WBC is elevated at 20.8. Lactic acid is <2. Pt with history of ESRD on HD.   Plan: Vanc 2gm IV x 1 then 1gm post-HD F/u renal plans, C&S, clinical status and pre-HD vanc level when appropriate     Temp (24hrs), Avg:97.8 F (36.6 C), Min:97.8 F (36.6 C), Max:97.8 F (36.6 C)   Recent Labs Lab 02/18/17 1821 02/18/17 1835  WBC 20.8*  --   CREATININE 6.23*  --   LATICACIDVEN  --  1.90    Estimated Creatinine Clearance: 16.3 mL/min (A) (by C-G formula based on SCr of 6.23 mg/dL (H)).    Allergies  Allergen Reactions  . Bee Venom Anaphylaxis  . Penicillins Swelling and Other (See Comments)    SWELLING REACTION UNSPECIFIED   Has patient had a PCN reaction causing immediate rash, facial/tongue/throat swelling, SOB or lightheadedness with hypotension: No Has patient had a PCN reaction causing severe rash involving mucus membranes or skin necrosis: No Has patient had a PCN reaction that required hospitalization No Has patient had a PCN reaction occurring within the last 10 years: No If all of the above answers are "NO", then may proceed with Cephalosporin use.  . Sulfadiazine Other (See Comments)    1% Silver Sulfadiazine cream causes burning over a large area of skin.    Antimicrobials this admission: Vanc 7/19>> Cipro x 1 7/19 Clinda x 1 7/19  Dose adjustments this admission: N/A  Microbiology results: Pending  Thank you for allowing pharmacy to be a part of this patient's care.  Ocie Stanzione, Rande Lawman 02/18/2017 9:09 PM

## 2017-02-18 NOTE — ED Notes (Signed)
MD at bedside. 

## 2017-02-18 NOTE — ED Triage Notes (Addendum)
Pt presents to ED for assessment of right foot, leg and hip pain.  Pt states the pain started after "they put some cream in it" at the wound clinic on Tuesday.  Pt c/o pain and burning up his leg and in to his groin.  Pt denies any other associated symptoms.  BP below 447 systolic at triage, hx of dialysis, will follow up for possible infection.  Pt had several amputations to toes on right foot a few months ago.

## 2017-02-18 NOTE — ED Provider Notes (Signed)
[facility] DEPT Provider Note   CSN: [id number] Arrival date & time: 02/18/17  1806     History   Chief Complaint No chief complaint on file.   HPI Jacob [name] is a 66 y.o. male.  HPI  66 year old male with a history of diabetes and end-stage renal disease on dialysis with recent transmetatarsal amputation of the right foot on 6/1 presents with right leg pain. The patient is overall a poor historian. He denies any fevers but states over the last 2 or 3 days he's been having worsening right lower 70 pain. It is mostly medial thigh and calf. However he has a hard time localizing exactly where it is. It seems to shoot up into his back. No abdominal pain. He does not urinate. He has not had any cough or shortness of breath or chest pain. He denies right foot pain but states that there seems to be increased drainage in his foot ever since that put a cream on it and they most recently wrapped his foot. He denies foot pain. He states his leg is painful but also somewhat weak. He states he has numbness to his buttocks when he sits for long time but otherwise the sensation returns. There is no bowel incontinence.  Past Medical History:  Diagnosis Date  . Anemia   . Anemia in chronic kidney disease(285.21)   . Anxiety   . Arthritis   . Depression   . Dyspnea    with exertion  . End stage renal disease ([place])    Hemodialysis on Monday, Wednesday, Friday- in [place]  . Essential hypertension   . GERD (gastroesophageal reflux disease)   . Glaucoma   . History of blood transfusion   . Insomnia   . Nonischemic cardiomyopathy ([place])   . Stroke [facility]) 2016   - "they said it was not a stroke"  . Type 2 diabetes mellitus ([place])    Type II    Patient Active Problem List   Diagnosis Date Noted  . Normocytic anemia 02/18/2017  . Wound infection 02/18/2017  . Hyponatremia 02/18/2017  . Hypokalemia 12/28/2016  . Insulin-requiring or dependent type II diabetes mellitus ([place]) 12/28/2016  .  Gangrene of foot ([place]) 12/28/2016  . Gangrene of right foot ([place]) 12/28/2016  . Hyperkalemia 04/05/2016  . Inadequate dialysis 04/05/2016  . Other complications due to renal dialysis device, implant, and graft 04/28/2013  . Subclavian vein occlusion ([facility]) 04/28/2013  . Preoperative cardiovascular examination 12/28/2012  . Mixed hyperlipidemia 06/16/2011  . Nonischemic cardiomyopathy ([place]) 11/20/2010  . End-stage renal disease on hemodialysis ([place]) 11/20/2010  . Essential hypertension, benign 11/20/2010    Past Surgical History:  Procedure Laterality Date  . A/V SHUNTOGRAM Right 10/06/2016   Procedure: A/V Fistulagram;  Surgeon: [name], MD;  Location: [facility];  Service: Cardiovascular;  Laterality: Right;  . ABDOMINAL AORTOGRAM N/A 12/30/2016   Procedure: Abdominal Aortogram;  Surgeon: Waynetta Sandy, MD;  Location: [facility];  Service: Cardiovascular;  Laterality: N/A;  . AV FISTULA PLACEMENT     Hx: of  . AV FISTULA PLACEMENT Right 05/04/2013   Procedure: ARTERIOVENOUS (AV) FISTULA CREATION- RIGHT ARM;  Surgeon: Conrad [place], MD;  Location: [facility];  Service: Vascular;  Laterality: Right;  Ultrasound guided  . AV FISTULA PLACEMENT Right 07/28/2016   Procedure: BRACHIOCEPHALIC ARTERIOVENOUS (AV) FISTULA CREATION;  Surgeon: [name], MD;  Location: [facility];  Service: Vascular;  Laterality: Right;  . CATARACT EXTRACTION W/PHACO Left  07/16/2014   Procedure: CATARACT EXTRACTION PHACO AND INTRAOCULAR LENS PLACEMENT (IOC);  Surgeon: [name], MD;  [facility]: [facility];  Service: Ophthalmology;  Laterality: Left;  CDE:8.86  . CATARACT EXTRACTION W/PHACO Right 07/30/2014   Procedure: CATARACT EXTRACTION PHACO AND INTRAOCULAR LENS PLACEMENT (IOC);  Surgeon: [name], MD;  Location: [facility];  Service: Ophthalmology;  Laterality: Right;  CDE 8.99  . COLONOSCOPY     Hx: of  . FISTULA SUPERFICIALIZATION Right 07/28/2016   Procedure: FISTULA  SUPERFICIALIZATION;  Surgeon: [name], MD;  Location: [facility];  Service: Vascular;  Laterality: Right;  . FISTULA SUPERFICIALIZATION Right 10/13/2016   Procedure: BRACHIOCEPHALIC ARTERIOVENOUS FISTULA SUPERFICIALIZATION;  Surgeon: [name], MD;  Location: [facility];  Service: Vascular;  Laterality: Right;  . FISTULOGRAM N/A 05/04/2013   Procedure: CENTRAL VENOGRAM;  Surgeon: Conrad [place], MD;  Location: [facility];  Service: Vascular;  Laterality: N/A;  . INSERTION OF DIALYSIS CATHETER Right 05/04/2013   Procedure: INSERTION OF DIALYSIS CATHETER;  Surgeon: Conrad [place], MD;  Location: [facility];  Service: Vascular;  Laterality: Right;  Ultrasound guided  . INSERTION OF DIALYSIS CATHETER N/A 07/28/2016   Procedure: INSERTION OF DIALYSIS CATHETER;  Surgeon: [name], MD;  Location: [facility];  Service: Vascular;  Laterality: N/A;  . LIGATION OF ARTERIOVENOUS  FISTULA Left 05/04/2013   Procedure: LIGATION OF LEFT RADIAL CEPHALIC ARTERIOVENOUS  FISTULA;  Surgeon: Conrad [place], MD;  Location: [facility];  Service: Vascular;  Laterality: Left;  Ultrasound guided  . LIGATION OF ARTERIOVENOUS  FISTULA Right 07/28/2016   Procedure: LIGATION OF RADIOCEPHALIC ARTERIOVENOUS  FISTULA;  Surgeon: [name], MD;  Location: [facility];  Service: Vascular;  Laterality: Right;  . LOWER EXTREMITY ANGIOGRAPHY N/A 12/30/2016   Procedure: Lower Extremity Angiography;  Surgeon: Waynetta Sandy, MD;  Location: [facility];  Service: Cardiovascular;  Laterality: N/A;  . LUMBAR SPINE SURGERY    . PERIPHERAL VASCULAR ATHERECTOMY Right 12/30/2016   Procedure: Peripheral Vascular Atherectomy;  Surgeon: Waynetta Sandy, MD;  Location: [facility];  Service: Cardiovascular;  Laterality: Right;  AT and PT  . PERIPHERAL VASCULAR BALLOON ANGIOPLASTY Right 12/30/2016   Procedure: Peripheral Vascular Balloon Angioplasty;  Surgeon: Waynetta Sandy, MD;  Location: [facility];  Service: Cardiovascular;  Laterality: Right;  PTA of PT and AT  . PERIPHERAL VASCULAR CATHETERIZATION N/A 01/24/2015   Procedure: Fistulagram;  Surgeon: Conrad [place], MD;  Location: [facility];  Service: Cardiovascular;  Laterality: N/A;  . PERIPHERAL VASCULAR CATHETERIZATION Right 06/04/2016   Procedure: A/V Shuntogram/Fistulagram;  Surgeon: Conrad Harriman, MD;  Location: [facility];  Service: Cardiovascular;  Laterality: Right;  . REVISON OF ARTERIOVENOUS FISTULA Right 01/02/2014   Procedure: REVISON OF ARTERIOVENOUS FISTULA ANASTOMOSIS;  Surgeon: Conrad [place], MD;  Location: [facility];  Service: Vascular;  Laterality: Right;  . REVISON OF ARTERIOVENOUS FISTULA Right 01/02/2016   Procedure: REVISION OF RADIOCEPHALIC ARTERIOVENOUS FISTULA  with BOVINE PATCH ANGIOPLASTY RIGHT RADIAL ARTERY;  Surgeon: Mal [name], MD;  Location: [facility];  Service: Vascular;  Laterality: Right;  . SHUNTOGRAM N/A 11/07/2013   Procedure: Fistulogram;  Surgeon: [name], MD;  Location: [facility];  Service: Cardiovascular;  Laterality: N/A;  . TRANSMETATARSAL AMPUTATION Right 01/01/2017   Procedure: TRANSMETATARSAL AMPUTATION;  Surgeon: [name], MD;  Location: [facility] OR;  Service: Vascular;  Laterality: Right;       Home Medications  Prior to Admission medications   Medication Sig Start Date End Date Taking? Authorizing Provider  acetaminophen (TYLENOL) 500 MG tablet Take 1,000 mg by mouth every 6 (six) hours as needed for mild pain.    [provider]  aspirin EC 325 MG tablet Take 325 mg by mouth daily.    [provider]  atorvastatin (LIPITOR) 40 MG tablet Take 40 mg by mouth daily.    [provider]  B Complex-C-Folic Acid (DIALYVITE TABLET) TABS Take 1 tablet by mouth every Monday, Wednesday, and Friday.     [provider]  bisacodyl (DULCOLAX) 5 MG EC tablet Take 2 tablets (10 mg total) by mouth daily as needed for moderate constipation.  01/04/17   Cristal Ford, DO  carvedilol (COREG) 6.25 MG tablet Take 6.25 mg by mouth 2 (two) times daily with a meal. Take 6.5mg s once daily on dialysis days, Mondays, Wednesdays, and Fridays    [provider]  cinacalcet (SENSIPAR) 30 MG tablet Take 30 mg by mouth daily.    [provider]  clopidogrel (PLAVIX) 75 MG tablet Take 1 tablet (75 mg total) by mouth daily with breakfast. 01/05/17   Cristal Ford, DO  dextromethorphan (ROBITUSSIN 12 HOUR COUGH) 30 MG/5ML liquid Take by mouth as needed for cough.    [provider]  divalproex (DEPAKOTE) 500 MG DR tablet Take 1 tablet (500 mg total) by mouth every 12 (twelve) hours. 01/04/17   Mikhail, Velta Addison, DO  EPIPEN 2-PAK 0.3 MG/0.3ML SOAJ injection Inject 0.3 mg into the muscle once as needed (For anaphylaxis.).  04/18/13   [provider]  LANTUS SOLOSTAR 100 UNIT/ML Solostar Pen Inject 20 Units into the skin at bedtime. If blood sugar is lower than 150 only inject 20 units at bedtime 01/15/15   [provider]  lisinopril (PRINIVIL,ZESTRIL) 20 MG tablet Take 20 mg by mouth daily.    [provider]  loratadine (CLARITIN) 10 MG tablet Take 10 mg by mouth daily as needed for allergies.    [provider]  omeprazole (PRILOSEC) 40 MG capsule Take 40 mg by mouth daily.     [provider]  oxyCODONE-acetaminophen (PERCOCET/ROXICET) 5-325 MG tablet Take 1 tablet by mouth every 4 (four) hours as needed for moderate pain. 01/04/17   Mikhail, Velta Addison, DO  ramipril (ALTACE) 10 MG capsule Take 10 mg by mouth daily.     [provider]  RENVELA 800 MG tablet Take 800-2,400 mg by mouth 3 (three) times daily with meals. Take 2400 mg by mouth 3 times daily with meals and 800 mg by mouth with snacks 05/25/13   [provider]    Family History Family History  Problem Relation Age of Onset  . Heart attack Brother        73  . Heart disease Brother        before age 88  .  Hypertension Brother   . Heart attack Brother        27  . Heart attack Brother        67  . Diabetes Mother   . Hypertension Mother   . Heart disease Mother   . Heart disease Father   . Hypertension Father   . Other Father        amputation  . Hypertension Sister   . Heart disease Sister   . Vision loss Maternal Uncle     Social History Social History  Substance Use Topics  . Smoking status: Never  Smoker  . Smokeless tobacco: Never Used  . Alcohol use Yes     Comment: 1- fifth of gin a week     Allergies   Bee venom; Penicillins; and Sulfadiazine   Review of Systems Review of Systems  Constitutional: Negative for fever.  Respiratory: Negative for cough and shortness of breath.   Cardiovascular: Negative for chest pain.  Gastrointestinal: Negative for abdominal pain.  Musculoskeletal: Positive for back pain and myalgias.  Skin: Positive for wound.  Neurological: Negative for weakness and numbness.  All other systems reviewed and are negative.    Physical Exam Updated Vital Signs BP (!) 124/55   Pulse 78   Temp 97.8 F (36.6 C) (Oral)   Resp 18   SpO2 93%   Physical Exam  Constitutional: He is oriented to person, place, and time. He appears well-developed and well-nourished.  Obese Appears uncomfortable, in pain  HENT:  Head: Normocephalic and atraumatic.  Right Ear: External ear normal.  Left Ear: External ear normal.  Nose: Nose normal.  Eyes: Right eye exhibits no discharge. Left eye exhibits no discharge.  Neck: Neck supple.  Cardiovascular: Normal rate, regular rhythm and normal heart sounds.   Pulmonary/Chest: Effort normal and breath sounds normal.  Abdominal: Soft. There is no tenderness.  Genitourinary:  Genitourinary Comments: Normal rectal tone. Normal peri-anal sensation. No gross blood  Musculoskeletal: He exhibits no edema.  No focal tenderness in RLE. Seems to have some difficulty lifting it off bed due to pain. RLE diffusely swollen  compared to right, mild. Slight warmth. No erythema. Right foot as in picture, no tenderness, mild drainage. No decreased ROM or tenderness to right hip or knee. Tenderness to midline lumbar and right lumbar back  Neurological: He is alert and oriented to person, place, and time.  Skin: Skin is warm and dry. He is not diaphoretic.  RUE fistula  Nursing note and vitals reviewed.      ED Treatments / Results  Labs (all labs ordered are listed, but only abnormal results are displayed) Labs Reviewed  COMPREHENSIVE METABOLIC PANEL - Abnormal; Notable for the following:       Result Value   Sodium 131 (*)    Chloride 94 (*)    Glucose, Bld 175 (*)    BUN 24 (*)    Creatinine, Ser 6.23 (*)    Calcium 8.5 (*)    Albumin 2.2 (*)    ALT 9 (*)    Alkaline Phosphatase 184 (*)    GFR calc non Af Amer 8 (*)    GFR calc Af Amer 10 (*)    All other components within normal limits  CBC WITH DIFFERENTIAL/PLATELET - Abnormal; Notable for the following:    WBC 20.8 (*)    RBC 3.14 (*)    Hemoglobin 8.0 (*)    HCT 26.6 (*)    MCH 25.5 (*)    RDW 17.0 (*)    Neutro Abs 17.6 (*)    Monocytes Absolute 1.5 (*)    All other components within normal limits  CULTURE, BLOOD (ROUTINE X 2)  CULTURE, BLOOD (ROUTINE X 2)  BASIC METABOLIC PANEL  CBC WITH DIFFERENTIAL/PLATELET  SEDIMENTATION RATE  C-REACTIVE PROTEIN  PREALBUMIN  I-STAT CG4 LACTIC ACID, ED  POC OCCULT BLOOD, ED    EKG  EKG Interpretation None       Radiology No results found.  Procedures Procedures (including critical care time)  Medications Ordered in ED Medications  clindamycin (CLEOCIN) IVPB 600 mg (600  mg Intravenous New Bag/Given 02/18/17 2147)  ciprofloxacin (CIPRO) IVPB 400 mg (not administered)  vancomycin (VANCOCIN) 2,000 mg in sodium chloride 0.9 % 500 mL IVPB (not administered)  vancomycin (VANCOCIN) IVPB 1000 mg/200 mL premix (not administered)  atorvastatin (LIPITOR) tablet 40 mg (not administered)    dextromethorphan (DELSYM) 30 MG/5ML liquid 30 mg (not administered)  lisinopril (PRINIVIL,ZESTRIL) tablet 20 mg (not administered)  bisacodyl (DULCOLAX) EC tablet 10 mg (not administered)  clopidogrel (PLAVIX) tablet 75 mg (not administered)  divalproex (DEPAKOTE) DR tablet 500 mg (not administered)  oxyCODONE-acetaminophen (PERCOCET/ROXICET) 5-325 MG per tablet 1 tablet (not administered)  loratadine (CLARITIN) tablet 10 mg (not administered)  carvedilol (COREG) tablet 6.25 mg (not administered)  cinacalcet (SENSIPAR) tablet 30 mg (not administered)  aspirin EC tablet 325 mg (not administered)  acetaminophen (TYLENOL) tablet 1,000 mg (not administered)  sevelamer carbonate (RENVELA) tablet 800-2,400 mg (not administered)  DIALYVITE TABLET TABS 1 tablet (not administered)  pantoprazole (PROTONIX) EC tablet 40 mg (not administered)  heparin injection 5,000 Units (not administered)  sodium chloride flush (NS) 0.9 % injection 3 mL (not administered)  sodium chloride flush (NS) 0.9 % injection 3 mL (not administered)  0.9 %  sodium chloride infusion (not administered)  ondansetron (ZOFRAN) tablet 4 mg (not administered)    Or  ondansetron (ZOFRAN) injection 4 mg (not administered)  insulin glargine (LANTUS) injection 20 Units (not administered)  insulin aspart (novoLOG) injection 0-5 Units (not administered)  insulin aspart (novoLOG) injection 0-9 Units (not administered)  cefTRIAXone (ROCEPHIN) 2 g in dextrose 5 % 50 mL IVPB (not administered)    And  metroNIDAZOLE (FLAGYL) IVPB 500 mg (not administered)  HYDROmorphone (DILAUDID) injection 1 mg (1 mg Intravenous Given 02/18/17 2057)     Initial Impression / Assessment and Plan / ED Course  I have reviewed the triage vital signs and the nursing notes.  Pertinent labs & imaging results that were available during my care of the patient were reviewed by me and considered in my medical decision making (see chart for details).     D/w  Dr. Donzetta Matters, vascular, who evaluated patient in ED. Foot appears to be worsening, concerning for infection with drainage and WBC of 20K without other obvious source. He will be given prolonged IV antibiotics and admitted to the hospitalist. Unclear why his entire right leg is hurting. He probably needs a DVT study which is not available at this time of night. He does have mild symmetric swelling. However his pain almost sounds radicular and thus I have ordered an MRI as well. However he does not have any cauda equina symptoms. Discussed with Dr. Myna Hidalgo, who will admit.   Final Clinical Impressions(s) / ED Diagnoses   Final diagnoses:  Right foot infection    New Prescriptions New Prescriptions   No medications on file     Sherwood Gambler, MD 02/18/17 2206

## 2017-02-18 NOTE — Patient Outreach (Signed)
[facility]) Care Management  02/18/2017  Jacob [name] January 30, 1951 [id number]  I reached out to Jacob [name] twice today to follow up on a nurse call line request. When I spoke with him this afternoon he indicated that he was trying to find out when his home health nurse was to visit but she had been to see him just before I called. Jacob [name] denied having any other outstanding needs. I noted upcoming appointments with LCSW Scott Forrest and Jacqlyn Larsen RN, BSN - his primary care manager. I'll forward this note to them to keep them abreast of Jacob [name] progress/call.    [facility]  [phone]

## 2017-02-18 NOTE — H&P (Signed)
History and Physical    Jacob [name] ERX:[id number] DOB: August 30, 1950 DOA: 02/18/2017  PCP: Sharilyn Sites, MD   Patient coming from: Home  Chief Complaint: Right leg pain, right foot wound   HPI: Jacob [name] is a 66 y.o. male with medical history significant for end-stage renal disease on hemodialysis, peripheral arterial disease status post angioplasty, insulin-dependent diabetes mellitus, anemia, and right TMA on 01/01/2017 with wound dehiscence, now presenting to the emergency department for evaluation of worsening pain in the right leg and increased drainage from the TMA site. Patient was seen in the vascular clinic on 02/16/2017 for follow-up, was noted to have wound dehiscence, but no real signs of infection at that time. Over the ensuing 1-2 days, patient developed increasing pain in the right leg with increased drainage from the surgical site, surrounding erythema, and with the new onset of chills and sweats. He had reported these complaints by phone to an outpatient physician who advised to seek an evaluation in the ED.  ED Course: Upon arrival to the ED, patient is found to be afebrile, saturating well on room air, initial blood pressure low but normalized spontaneously and remained stable since, and normal heart rate. Chemistry panel was notable for sodium of 131 and glucose of 175. CBC features a leukocytosis to 20,800 and a normocytic anemia with hemoglobin of 8.0, down from 9.5 early last month. Lactic acid is within the normal limits at 1.90. Blood cultures were obtained in the emergency department, 1 mg of Dilaudid was given, and the patient was treated with empiric ciprofloxacin, clindamycin, and vancomycin. Vascular surgery was consulted by the ED physician and has evaluated the patient and the emergency department, recommending for a medical admission and antibiotics. Revision of the TMA or additional amputation is being considered. Patient remained hemodynamically stable in the ED and  with no apparent respiratory distress, and will be admitted to medical/surgical unit for ongoing evaluation and management of right foot wound with suspected infection in an insulin-dependent diabetic with ESRD and history of infection with MRSA.  Review of Systems:  All other systems reviewed and apart from HPI, are negative.  Past Medical History:  Diagnosis Date  . Anemia   . Anemia in chronic kidney disease(285.21)   . Anxiety   . Arthritis   . Depression   . Dyspnea    with exertion  . End stage renal disease ([place])    Hemodialysis on Monday, Wednesday, Friday- in [place]  . Essential hypertension   . GERD (gastroesophageal reflux disease)   . Glaucoma   . History of blood transfusion   . Insomnia   . Nonischemic cardiomyopathy ([place])   . Stroke [facility]) 2016   - "they said it was not a stroke"  . Type 2 diabetes mellitus ([place])    Type II    Past Surgical History:  Procedure Laterality Date  . A/V SHUNTOGRAM Right 10/06/2016   Procedure: A/V Fistulagram;  Surgeon: [name], MD;  Location: [place] [facility];  Service: Cardiovascular;  Laterality: Right;  . ABDOMINAL AORTOGRAM N/A 12/30/2016   Procedure: Abdominal Aortogram;  Surgeon: Waynetta Sandy, MD;  Location: [place] [facility];  Service: Cardiovascular;  Laterality: N/A;  . AV FISTULA PLACEMENT     Hx: of  . AV FISTULA PLACEMENT Right 05/04/2013   Procedure: ARTERIOVENOUS (AV) FISTULA CREATION- RIGHT ARM;  Surgeon: Conrad Grimes, MD;  Location: [place];  Service: Vascular;  Laterality: Right;  Ultrasound guided  . AV FISTULA  PLACEMENT Right 07/28/2016   Procedure: BRACHIOCEPHALIC ARTERIOVENOUS (AV) FISTULA CREATION;  Surgeon: [name], MD;  Location: [place];  Service: Vascular;  Laterality: Right;  . CATARACT EXTRACTION W/PHACO Left 07/16/2014   Procedure: CATARACT EXTRACTION PHACO AND INTRAOCULAR LENS PLACEMENT (IOC);  Surgeon: [name], MD;  Location: [facility];  Service: Ophthalmology;  Laterality:  Left;  CDE:8.86  . CATARACT EXTRACTION W/PHACO Right 07/30/2014   Procedure: CATARACT EXTRACTION PHACO AND INTRAOCULAR LENS PLACEMENT (IOC);  Surgeon: [name], MD;  Location: [facility];  Service: Ophthalmology;  Laterality: Right;  CDE 8.99  . COLONOSCOPY     Hx: of  . FISTULA SUPERFICIALIZATION Right 07/28/2016   Procedure: FISTULA SUPERFICIALIZATION;  Surgeon: [name], MD;  Location: [place];  Service: Vascular;  Laterality: Right;  . FISTULA SUPERFICIALIZATION Right 10/13/2016   Procedure: BRACHIOCEPHALIC ARTERIOVENOUS FISTULA SUPERFICIALIZATION;  Surgeon: [name], MD;  Location: [place];  Service: Vascular;  Laterality: Right;  . FISTULOGRAM N/A 05/04/2013   Procedure: CENTRAL VENOGRAM;  Surgeon: Conrad Tingley, MD;  Location: [place];  Service: Vascular;  Laterality: N/A;  . INSERTION OF DIALYSIS CATHETER Right 05/04/2013   Procedure: INSERTION OF DIALYSIS CATHETER;  Surgeon: Conrad [place], MD;  Location: [place];  Service: Vascular;  Laterality: Right;  Ultrasound guided  . INSERTION OF DIALYSIS CATHETER N/A 07/28/2016   Procedure: INSERTION OF DIALYSIS CATHETER;  Surgeon: [name], MD;  Location: [place];  Service: Vascular;  Laterality: N/A;  . LIGATION OF ARTERIOVENOUS  FISTULA Left 05/04/2013   Procedure: LIGATION OF LEFT RADIAL CEPHALIC ARTERIOVENOUS  FISTULA;  Surgeon: Conrad [place], MD;  Location: [place];  Service: Vascular;  Laterality: Left;  Ultrasound guided  . LIGATION OF ARTERIOVENOUS  FISTULA Right 07/28/2016   Procedure: LIGATION OF RADIOCEPHALIC ARTERIOVENOUS  FISTULA;  Surgeon: [name], MD;  Location: [place];  Service: Vascular;  Laterality: Right;  . LOWER EXTREMITY ANGIOGRAPHY N/A 12/30/2016   Procedure: Lower Extremity Angiography;  Surgeon: Waynetta Sandy, MD;  Location: [place] [facility];  Service: Cardiovascular;  Laterality: N/A;  . LUMBAR SPINE SURGERY    . PERIPHERAL VASCULAR ATHERECTOMY Right 12/30/2016   Procedure:  Peripheral Vascular Atherectomy;  Surgeon: Waynetta Sandy, MD;  Location: [place] [facility];  Service: Cardiovascular;  Laterality: Right;  AT and PT  . PERIPHERAL VASCULAR BALLOON ANGIOPLASTY Right 12/30/2016   Procedure: Peripheral Vascular Balloon Angioplasty;  Surgeon: Waynetta Sandy, MD;  Location: [place] [facility];  Service: Cardiovascular;  Laterality: Right;  PTA of PT and AT  . PERIPHERAL VASCULAR CATHETERIZATION N/A 01/24/2015   Procedure: Fistulagram;  Surgeon: Conrad [place], MD;  Location: [place] [facility];  Service: Cardiovascular;  Laterality: N/A;  . PERIPHERAL VASCULAR CATHETERIZATION Right 06/04/2016   Procedure: A/V Shuntogram/Fistulagram;  Surgeon: Conrad [place], MD;  Location: [place] [facility];  Service: Cardiovascular;  Laterality: Right;  . REVISON OF ARTERIOVENOUS FISTULA Right 01/02/2014   Procedure: REVISON OF ARTERIOVENOUS FISTULA ANASTOMOSIS;  Surgeon: Conrad , MD;  Location: [place];  Service: Vascular;  Laterality: Right;  . REVISON OF ARTERIOVENOUS FISTULA Right 01/02/2016   Procedure: REVISION OF RADIOCEPHALIC ARTERIOVENOUS FISTULA  with BOVINE PATCH ANGIOPLASTY RIGHT RADIAL ARTERY;  Surgeon: Mal [name], MD;  Location: [place];  Service: Vascular;  Laterality: Right;  . SHUNTOGRAM N/A 11/07/2013   Procedure: Fistulogram;  Surgeon: [name], MD;  Location: [facility];  Service: Cardiovascular;  Laterality: N/A;  .  TRANSMETATARSAL AMPUTATION Right 01/01/2017   Procedure: TRANSMETATARSAL AMPUTATION;  Surgeon: [name], MD;  Location: [place];  Service: Vascular;  Laterality: Right;     reports that he has never smoked. He has never used smokeless tobacco. He reports that he drinks alcohol. He reports that he does not use drugs.  Allergies  Allergen Reactions  . Bee Venom Anaphylaxis  . Penicillins Swelling and Other (See Comments)    SWELLING REACTION UNSPECIFIED   Has patient had a PCN reaction causing immediate rash,  facial/tongue/throat swelling, SOB or lightheadedness with hypotension: No Has patient had a PCN reaction causing severe rash involving mucus membranes or skin necrosis: No Has patient had a PCN reaction that required hospitalization No Has patient had a PCN reaction occurring within the last 10 years: No If all of the above answers are "NO", then may proceed with Cephalosporin use.  . Sulfadiazine Other (See Comments)    1% Silver Sulfadiazine cream causes burning over a large area of skin.    Family History  Problem Relation Age of Onset  . Heart attack Brother        37  . Heart disease Brother        before age 74  . Hypertension Brother   . Heart attack Brother        60  . Heart attack Brother        71  . Diabetes Mother   . Hypertension Mother   . Heart disease Mother   . Heart disease Father   . Hypertension Father   . Other Father        amputation  . Hypertension Sister   . Heart disease Sister   . Vision loss Maternal Uncle      Prior to Admission medications   Medication Sig Start Date End Date Taking? Authorizing Provider  acetaminophen (TYLENOL) 500 MG tablet Take 1,000 mg by mouth every 6 (six) hours as needed for mild pain.    [provider]  aspirin EC 325 MG tablet Take 325 mg by mouth daily.    [provider]  atorvastatin (LIPITOR) 40 MG tablet Take 40 mg by mouth daily.    [provider]  B Complex-C-Folic Acid (DIALYVITE TABLET) TABS Take 1 tablet by mouth every Monday, Wednesday, and Friday.     [provider]  bisacodyl (DULCOLAX) 5 MG EC tablet Take 2 tablets (10 mg total) by mouth daily as needed for moderate constipation. 01/04/17   Cristal Ford, DO  carvedilol (COREG) 6.25 MG tablet Take 6.25 mg by mouth 2 (two) times daily with a meal. Take 6.5mg s once daily on dialysis days, Mondays, Wednesdays, and Fridays    [provider]  cinacalcet (SENSIPAR) 30 MG tablet Take 30 mg by mouth daily.     [provider]  clopidogrel (PLAVIX) 75 MG tablet Take 1 tablet (75 mg total) by mouth daily with breakfast. 01/05/17   Cristal Ford, DO  dextromethorphan (ROBITUSSIN 12 HOUR COUGH) 30 MG/5ML liquid Take by mouth as needed for cough.    [provider]  divalproex (DEPAKOTE) 500 MG DR tablet Take 1 tablet (500 mg total) by mouth every 12 (twelve) hours. 01/04/17   Mikhail, Velta Addison, DO  EPIPEN 2-PAK 0.3 MG/0.3ML SOAJ injection Inject 0.3 mg into the muscle once as needed (For anaphylaxis.).  04/18/13   [provider]  LANTUS SOLOSTAR 100 UNIT/ML Solostar Pen Inject 20 Units into the skin at bedtime. If blood sugar is  lower than 150 only inject 20 units at bedtime 01/15/15   [provider]  lisinopril (PRINIVIL,ZESTRIL) 20 MG tablet Take 20 mg by mouth daily.    [provider]  loratadine (CLARITIN) 10 MG tablet Take 10 mg by mouth daily as needed for allergies.    [provider]  omeprazole (PRILOSEC) 40 MG capsule Take 40 mg by mouth daily.     [provider]  oxyCODONE-acetaminophen (PERCOCET/ROXICET) 5-325 MG tablet Take 1 tablet by mouth every 4 (four) hours as needed for moderate pain. 01/04/17   Mikhail, Velta Addison, DO  ramipril (ALTACE) 10 MG capsule Take 10 mg by mouth daily.     [provider]  RENVELA 800 MG tablet Take 800-2,400 mg by mouth 3 (three) times daily with meals. Take 2400 mg by mouth 3 times daily with meals and 800 mg by mouth with snacks 05/25/13   [provider]    Physical Exam: Vitals:   02/18/17 2015 02/18/17 2019 02/18/17 2045 02/18/17 2115  BP: 136/64 136/64 (!) 124/55   Pulse: 68 67 81 78  Resp:  18    Temp:      TempSrc:      SpO2: 93% 98% 100% 93%      Constitutional: No acute distress. Appears chronically-ill, uncomfortable. No diaphoresis or pallor.  Eyes: PERTLA, lids and conjunctivae normal ENMT: Mucous membranes are moist. Posterior pharynx clear of any exudate or  lesions.   Neck: normal, supple, no masses, no thyromegaly Respiratory: clear to auscultation bilaterally, no wheezing, no crackles. Normal respiratory effort.  Cardiovascular: S1 & S2 heard, regular rate and rhythm. No significant JVD. Abdomen: No distension, no tenderness, no masses palpated. Bowel sounds normal.  Musculoskeletal: no clubbing / cyanosis. Status-post right TMA with wound dehiscence, mild surrounding erythema, edema, and tenderness, scant drainage.   Skin: Other than right foot findings, no significant rashes, lesions, ulcers. Warm, dry, well-perfused. Neurologic: CN 2-12 grossly intact. Sensation intact, DTR normal. Strength 5/5 in all 4 limbs.  Psychiatric: Alert and oriented x 3. Calm, cooperative.     Labs on Admission: I have personally reviewed following labs and imaging studies  CBC:  Recent Labs Lab 02/18/17 1821  WBC 20.8*  NEUTROABS 17.6*  HGB 8.0*  HCT 26.6*  MCV 84.7  PLT 124   Basic Metabolic Panel:  Recent Labs Lab 02/18/17 1821  NA 131*  K 3.8  CL 94*  CO2 25  GLUCOSE 175*  BUN 24*  CREATININE 6.23*  CALCIUM 8.5*   GFR: Estimated Creatinine Clearance: 16.3 mL/min (A) (by C-G formula based on SCr of 6.23 mg/dL (H)). Liver Function Tests:  Recent Labs Lab 02/18/17 1821  AST 19  ALT 9*  ALKPHOS 184*  BILITOT 0.4  PROT 8.1  ALBUMIN 2.2*   No results for input(s): LIPASE, AMYLASE in the last 168 hours. No results for input(s): AMMONIA in the last 168 hours. Coagulation Profile: No results for input(s): INR, PROTIME in the last 168 hours. Cardiac Enzymes: No results for input(s): CKTOTAL, CKMB, CKMBINDEX, TROPONINI in the last 168 hours. BNP (last 3 results) No results for input(s): PROBNP in the last 8760 hours. HbA1C: No results for input(s): HGBA1C in the last 72 hours. CBG: No results for input(s): GLUCAP in the last 168 hours. Lipid Profile: No results for input(s): CHOL, HDL, LDLCALC, TRIG, CHOLHDL, LDLDIRECT in the  last 72 hours. Thyroid Function Tests: No results for input(s): TSH, T4TOTAL, FREET4, T3FREE, THYROIDAB in the last 72 hours. Anemia Panel:  No results for input(s): VITAMINB12, FOLATE, FERRITIN, TIBC, IRON, RETICCTPCT in the last 72 hours. Urine analysis: No results found for: COLORURINE, APPEARANCEUR, LABSPEC, PHURINE, GLUCOSEU, HGBUR, BILIRUBINUR, KETONESUR, PROTEINUR, UROBILINOGEN, NITRITE, LEUKOCYTESUR Sepsis Labs: @LABRCNTIP (procalcitonin:4,lacticidven:4) )No results found for this or any previous visit (from the past 240 hour(s)).   Radiological Exams on Admission: No results found.  EKG: Not performed.   Assessment/Plan  1. Wound dehiscence, infection - Pt underwent right TMA on 01/01/17, had wound dehiscence, but was otherwise well with no signs of infection until the day prior to admission when chills, sweats, increased pain, surrounding erythema, and increased drainage developed  - Has marked leukocytosis on admission with reassuring lactate, no fever, and stable hemodynamics  - Vascular surgery is consulting and much appreciated, recommends abx, considering surgery  - Blood cultures were collected in ED and empiric vancomycin, clindamycin, and ciprofloxacin was given  - Plan to continue empiric abx with vancomycin, Rocephin, and Flagyl in the setting of diabetes and hx of infxn with MRSA   2. ESRD  - Pt typically dialyzed MWF, reports completing HD on 02/17/17 without incident  - No significant volume excess, hyperkalemia, or acidosis on admission - Renal diet with fluid-restrictions  - Routine HD requested    3. Insulin-dependent DM  - A1c was 7.7% in May 2018  - Managed at home with Lantus 20 units qHS  - Continue Lantus with 15 units qHS plus a Novolog correctional   4. Anemia  - Hgb is 8.0 on admission, down from 9.5 on 01/04/17  - Denies melena, hematochezia, abdominal pain, or nausea  - Plan to check anemia panel and FOBT, obtain type & screen, repeat CBC in am    5. Hypertension  - Initial BP in ED was low, normal to elevated since without any intervention  - Managed at home with lisinopril and Coreg, will continue as tolerated     DVT prophylaxis: sq heparin Code Status: Full  Family Communication: Sister updated at bedside Disposition Plan: Admit to med-surg Consults called: Vascular surgery Admission status: Inpatient    Vianne Bulls, MD [facility] Pager [phone]  If 7PM-7AM, please contact night-coverage [email or web address] Password [facility]  02/18/2017, 9:58 PM

## 2017-02-19 ENCOUNTER — Inpatient Hospital Stay (HOSPITAL_COMMUNITY): Payer: Medicare Other

## 2017-02-19 ENCOUNTER — Other Ambulatory Visit: Payer: Self-pay | Admitting: *Deleted

## 2017-02-19 DIAGNOSIS — T8743 Infection of amputation stump, right lower extremity: Principal | ICD-10-CM

## 2017-02-19 DIAGNOSIS — B9562 Methicillin resistant Staphylococcus aureus infection as the cause of diseases classified elsewhere: Secondary | ICD-10-CM

## 2017-02-19 DIAGNOSIS — N186 End stage renal disease: Secondary | ICD-10-CM

## 2017-02-19 DIAGNOSIS — Z794 Long term (current) use of insulin: Secondary | ICD-10-CM

## 2017-02-19 DIAGNOSIS — Z91030 Bee allergy status: Secondary | ICD-10-CM

## 2017-02-19 DIAGNOSIS — Z992 Dependence on renal dialysis: Secondary | ICD-10-CM

## 2017-02-19 DIAGNOSIS — Z888 Allergy status to other drugs, medicaments and biological substances status: Secondary | ICD-10-CM

## 2017-02-19 DIAGNOSIS — R7881 Bacteremia: Secondary | ICD-10-CM

## 2017-02-19 DIAGNOSIS — E1122 Type 2 diabetes mellitus with diabetic chronic kidney disease: Secondary | ICD-10-CM

## 2017-02-19 DIAGNOSIS — Z880 Allergy status to penicillin: Secondary | ICD-10-CM

## 2017-02-19 DIAGNOSIS — Y835 Amputation of limb(s) as the cause of abnormal reaction of the patient, or of later complication, without mention of misadventure at the time of the procedure: Secondary | ICD-10-CM

## 2017-02-19 DIAGNOSIS — Z89421 Acquired absence of other right toe(s): Secondary | ICD-10-CM

## 2017-02-19 DIAGNOSIS — T814XXA Infection following a procedure, initial encounter: Secondary | ICD-10-CM

## 2017-02-19 DIAGNOSIS — Z7982 Long term (current) use of aspirin: Secondary | ICD-10-CM

## 2017-02-19 DIAGNOSIS — L089 Local infection of the skin and subcutaneous tissue, unspecified: Secondary | ICD-10-CM

## 2017-02-19 DIAGNOSIS — I739 Peripheral vascular disease, unspecified: Secondary | ICD-10-CM

## 2017-02-19 DIAGNOSIS — E11628 Type 2 diabetes mellitus with other skin complications: Secondary | ICD-10-CM

## 2017-02-19 DIAGNOSIS — Z882 Allergy status to sulfonamides status: Secondary | ICD-10-CM

## 2017-02-19 LAB — CBC WITH DIFFERENTIAL/PLATELET
Basophils Absolute: 0.1 10*3/uL (ref 0.0–0.1)
Basophils Relative: 0 %
EOS ABS: 0.3 10*3/uL (ref 0.0–0.7)
EOS PCT: 1 %
HCT: 25.9 % — ABNORMAL LOW (ref 39.0–52.0)
Hemoglobin: 7.7 g/dL — ABNORMAL LOW (ref 13.0–17.0)
LYMPHS ABS: 1.3 10*3/uL (ref 0.7–4.0)
LYMPHS PCT: 6 %
MCH: 25.1 pg — AB (ref 26.0–34.0)
MCHC: 29.7 g/dL — AB (ref 30.0–36.0)
MCV: 84.4 fL (ref 78.0–100.0)
MONO ABS: 1.6 10*3/uL — AB (ref 0.1–1.0)
MONOS PCT: 8 %
Neutro Abs: 18.1 10*3/uL — ABNORMAL HIGH (ref 1.7–7.7)
Neutrophils Relative %: 85 %
PLATELETS: 396 10*3/uL (ref 150–400)
RBC: 3.07 MIL/uL — AB (ref 4.22–5.81)
RDW: 17.2 % — AB (ref 11.5–15.5)
WBC: 21.3 10*3/uL — AB (ref 4.0–10.5)

## 2017-02-19 LAB — GLUCOSE, CAPILLARY
GLUCOSE-CAPILLARY: 88 mg/dL (ref 65–99)
Glucose-Capillary: 150 mg/dL — ABNORMAL HIGH (ref 65–99)
Glucose-Capillary: 124 mg/dL — ABNORMAL HIGH (ref 65–99)
Glucose-Capillary: 92 mg/dL (ref 65–99)

## 2017-02-19 LAB — BLOOD CULTURE ID PANEL (REFLEXED)
Acinetobacter baumannii: NOT DETECTED
CANDIDA GLABRATA: NOT DETECTED
CANDIDA KRUSEI: NOT DETECTED
CANDIDA PARAPSILOSIS: NOT DETECTED
CANDIDA TROPICALIS: NOT DETECTED
Candida albicans: NOT DETECTED
ENTEROBACTER CLOACAE COMPLEX: NOT DETECTED
ESCHERICHIA COLI: NOT DETECTED
Enterobacteriaceae species: NOT DETECTED
Enterococcus species: NOT DETECTED
Haemophilus influenzae: NOT DETECTED
KLEBSIELLA OXYTOCA: NOT DETECTED
KLEBSIELLA PNEUMONIAE: NOT DETECTED
Listeria monocytogenes: NOT DETECTED
Methicillin resistance: DETECTED — AB
Neisseria meningitidis: NOT DETECTED
PROTEUS SPECIES: NOT DETECTED
Pseudomonas aeruginosa: NOT DETECTED
SERRATIA MARCESCENS: NOT DETECTED
STAPHYLOCOCCUS SPECIES: DETECTED — AB
STREPTOCOCCUS AGALACTIAE: NOT DETECTED
Staphylococcus aureus (BCID): DETECTED — AB
Streptococcus pneumoniae: NOT DETECTED
Streptococcus pyogenes: NOT DETECTED
Streptococcus species: NOT DETECTED

## 2017-02-19 LAB — RENAL FUNCTION PANEL
ALBUMIN: 2.2 g/dL — AB (ref 3.5–5.0)
Anion gap: 12 (ref 5–15)
BUN: 25 mg/dL — ABNORMAL HIGH (ref 6–20)
CALCIUM: 8.6 mg/dL — AB (ref 8.9–10.3)
CO2: 26 mmol/L (ref 22–32)
CREATININE: 6.45 mg/dL — AB (ref 0.61–1.24)
Chloride: 92 mmol/L — ABNORMAL LOW (ref 101–111)
GFR calc Af Amer: 9 mL/min — ABNORMAL LOW (ref >60)
GFR calc non Af Amer: 8 mL/min — ABNORMAL LOW (ref >60)
GLUCOSE: 174 mg/dL — AB (ref 65–99)
PHOSPHORUS: 2.6 mg/dL (ref 2.5–4.6)
Potassium: 3.6 mmol/L (ref 3.5–5.1)
SODIUM: 130 mmol/L — AB (ref 135–145)

## 2017-02-19 LAB — MRSA PCR SCREENING
MRSA BY PCR: NEGATIVE
MRSA by PCR: POSITIVE — AB

## 2017-02-19 LAB — BASIC METABOLIC PANEL
Anion gap: 13 (ref 5–15)
BUN: 27 mg/dL — AB (ref 6–20)
CHLORIDE: 94 mmol/L — AB (ref 101–111)
CO2: 27 mmol/L (ref 22–32)
CREATININE: 6.66 mg/dL — AB (ref 0.61–1.24)
Calcium: 8.7 mg/dL — ABNORMAL LOW (ref 8.9–10.3)
GFR calc Af Amer: 9 mL/min — ABNORMAL LOW (ref >60)
GFR calc non Af Amer: 8 mL/min — ABNORMAL LOW (ref >60)
GLUCOSE: 152 mg/dL — AB (ref 65–99)
POTASSIUM: 3.8 mmol/L (ref 3.5–5.1)
SODIUM: 134 mmol/L — AB (ref 135–145)

## 2017-02-19 LAB — C-REACTIVE PROTEIN: CRP: 31.4 mg/dL — ABNORMAL HIGH (ref <1.0)

## 2017-02-19 LAB — ABO/RH: ABO/RH(D): O POS

## 2017-02-19 LAB — SEDIMENTATION RATE: SED RATE: 108 mm/h — AB (ref 0–16)

## 2017-02-19 LAB — PREALBUMIN: Prealbumin: 7.8 mg/dL — ABNORMAL LOW (ref 18–38)

## 2017-02-19 MED ORDER — LIDOCAINE HCL (PF) 1 % IJ SOLN: 5.0000 mL | INTRAMUSCULAR | Status: DC | PRN

## 2017-02-19 MED ORDER — HEPARIN SODIUM (PORCINE) 1000 UNIT/ML DIALYSIS: 1000.0000 [IU] | INTRAMUSCULAR | Status: DC | PRN

## 2017-02-19 MED ORDER — PRO-STAT SUGAR FREE PO LIQD
30.0000 mL | Freq: Every day | ORAL | Status: DC
  Administered 2017-02-20 – 2017-03-03 (×11): 30 mL via ORAL
  Filled 2017-02-19 (×11): qty 30

## 2017-02-19 MED ORDER — PENTAFLUOROPROP-TETRAFLUOROETH EX AERO: 1.0000 "application " | INHALATION_SPRAY | CUTANEOUS | Status: DC | PRN

## 2017-02-19 MED ORDER — DARBEPOETIN ALFA 60 MCG/0.3ML IJ SOSY
60.0000 ug | PREFILLED_SYRINGE | INTRAMUSCULAR | Status: DC
Start: 2017-02-19 — End: 2017-03-01
  Administered 2017-02-19 – 2017-02-26 (×2): 60 ug via INTRAVENOUS
  Filled 2017-02-19: qty 0.3

## 2017-02-19 MED ORDER — VANCOMYCIN HCL IN DEXTROSE 1-5 GM/200ML-% IV SOLN
INTRAVENOUS | Status: AC
  Administered 2017-02-19: 1000 mg via INTRAVENOUS
  Filled 2017-02-19: qty 200

## 2017-02-19 MED ORDER — HEPARIN SODIUM (PORCINE) 1000 UNIT/ML DIALYSIS
20.0000 [IU]/kg | INTRAMUSCULAR | Status: DC | PRN
  Administered 2017-02-19: 2600 [IU] via INTRAVENOUS_CENTRAL
  Filled 2017-02-19: qty 3

## 2017-02-19 MED ORDER — SEVELAMER CARBONATE 800 MG PO TABS
2400.0000 mg | ORAL_TABLET | Freq: Three times a day (TID) | ORAL | Status: DC
  Administered 2017-02-19 – 2017-03-03 (×24): 2400 mg via ORAL
  Filled 2017-02-19 (×27): qty 3

## 2017-02-19 MED ORDER — SODIUM CHLORIDE 0.9 % IV SOLN: 100.0000 mL | INTRAVENOUS | Status: DC | PRN

## 2017-02-19 MED ORDER — NEPRO/CARBSTEADY PO LIQD
237.0000 mL | Freq: Every day | ORAL | Status: DC
  Administered 2017-02-20 – 2017-03-02 (×4): 237 mL via ORAL
  Filled 2017-02-19 (×6): qty 237

## 2017-02-19 MED ORDER — DARBEPOETIN ALFA 60 MCG/0.3ML IJ SOSY
PREFILLED_SYRINGE | INTRAMUSCULAR | Status: AC
  Administered 2017-02-19: 60 ug via INTRAVENOUS
  Filled 2017-02-19: qty 0.3

## 2017-02-19 MED ORDER — ALBUTEROL SULFATE (2.5 MG/3ML) 0.083% IN NEBU
2.5000 mg | INHALATION_SOLUTION | Freq: Four times a day (QID) | RESPIRATORY_TRACT | Status: DC | PRN
  Filled 2017-02-19: qty 3

## 2017-02-19 MED ORDER — SEVELAMER CARBONATE 800 MG PO TABS: 800.0000 mg | ORAL_TABLET | Freq: Two times a day (BID) | ORAL | Status: DC | PRN

## 2017-02-19 MED ORDER — OXYCODONE-ACETAMINOPHEN 5-325 MG PO TABS
ORAL_TABLET | ORAL | Status: AC
  Administered 2017-02-19: 1 via ORAL
  Filled 2017-02-19: qty 1

## 2017-02-19 MED ORDER — CARVEDILOL 3.125 MG PO TABS
6.2500 mg | ORAL_TABLET | ORAL | Status: DC
  Administered 2017-02-20 – 2017-02-23 (×5): 6.25 mg via ORAL
  Filled 2017-02-19 (×4): qty 1
  Filled 2017-02-19: qty 2

## 2017-02-19 MED ORDER — DOXERCALCIFEROL 4 MCG/2ML IV SOLN
INTRAVENOUS | Status: AC
  Administered 2017-02-19: 1.5 ug via INTRAVENOUS
  Filled 2017-02-19: qty 2

## 2017-02-19 MED ORDER — DOXERCALCIFEROL 4 MCG/2ML IV SOLN
1.5000 ug | INTRAVENOUS | Status: DC
  Administered 2017-02-19 – 2017-03-03 (×6): 1.5 ug via INTRAVENOUS
  Filled 2017-02-19 (×5): qty 2

## 2017-02-19 MED ORDER — LIDOCAINE-PRILOCAINE 2.5-2.5 % EX CREA: 1.0000 "application " | TOPICAL_CREAM | CUTANEOUS | Status: DC | PRN

## 2017-02-19 MED ORDER — CARVEDILOL 3.125 MG PO TABS
6.2500 mg | ORAL_TABLET | ORAL | Status: DC
  Administered 2017-02-22: 6.25 mg via ORAL

## 2017-02-19 MED ORDER — INSULIN GLARGINE 100 UNIT/ML ~~LOC~~ SOLN
15.0000 [IU] | Freq: Every day | SUBCUTANEOUS | Status: DC
  Administered 2017-02-20 – 2017-03-02 (×6): 15 [IU] via SUBCUTANEOUS
  Filled 2017-02-19 (×13): qty 0.15

## 2017-02-19 NOTE — Consult Note (Addendum)
Consult requested for foot wound prior to vascular team involvement.  They are now following for assessment and plan of care and plan for possible surgery, according to the EMR.  Please refer to their team for further questions. Please re-consult if further assistance is needed.  Thank-you,  Julien Girt MSN, [place], [place], [place], [place]

## 2017-02-19 NOTE — Patient Outreach (Signed)
Telephone call to pt for follow up, spoke with pt, HIPAA verified, pt states he is in [facility] after instructed to go to ED about his foot and is now admitted, pt has wound infection and dehiscence, surgical plan in process for foot.  RN CM sent in basket to hospital liason [place] and [place].  Jac[place]lyn Larsen [facility], [place] Coordinator [phone]

## 2017-02-19 NOTE — Progress Notes (Signed)
  Progress Note    02/19/2017 10:47 AM * No surgery found *  Subjective:  No overnight changes  Vitals:   02/18/17 2321 02/19/17 0553  BP: 139/61 97/62  Pulse: 82 78  Resp: 18 18  Temp: 98.6 F (37 C) 98.2 F (36.8 C)    Physical Exam: [email or web address] Strong AT signalon right  Weak PT on right Stable drainage and dehisced wound  CBC    Component Value Date/Time   WBC 21.3 (H) 02/19/2017 0503   RBC 3.07 (L) 02/19/2017 0503   HGB 7.7 (L) 02/19/2017 0503   HCT 25.9 (L) 02/19/2017 0503   PLT 396 02/19/2017 0503   MCV 84.4 02/19/2017 0503   MCH 25.1 (L) 02/19/2017 0503   MCHC 29.7 (L) 02/19/2017 0503   RDW 17.2 (H) 02/19/2017 0503   LYMPHSABS 1.3 02/19/2017 0503   MONOABS 1.6 (H) 02/19/2017 0503   EOSABS 0.3 02/19/2017 0503   BASOSABS 0.1 02/19/2017 0503    BMET    Component Value Date/Time   NA 134 (L) 02/19/2017 0503   K 3.8 02/19/2017 0503   CL 94 (L) 02/19/2017 0503   CO2 27 02/19/2017 0503   GLUCOSE 152 (H) 02/19/2017 0503   BUN 27 (H) 02/19/2017 0503   CREATININE 6.66 (H) 02/19/2017 0503   CALCIUM 8.7 (L) 02/19/2017 0503   GFRNONAA 8 (L) 02/19/2017 0503   GFRAA 9 (L) 02/19/2017 0503    INR    Component Value Date/Time   INR 1.30 12/30/2016 0226     Intake/Output Summary (Last 24 hours) at 02/19/17 1047 Last data filed at 02/19/17 0600  Gross per 24 hour  Intake              810 ml  Output                0 ml  Net              810 ml     Assessment:  66 y.o. male is s/p right tma after endovascular revascularization. Strong AT signal  Plan: rle arterial duplex today Will need at least revision of right tma with possible conversion to bka depending on duplex results   Naji Mehringer C. Donzetta Matters, MD [facility] Office: [phone] Pager: [phone]  02/19/2017 10:47 AM

## 2017-02-19 NOTE — Progress Notes (Signed)
PHARMACY - PHYSICIAN COMMUNICATION CRITICAL VALUE ALERT - BLOOD CULTURE IDENTIFICATION (BCID)  Results for orders placed or performed during the hospital encounter of 02/18/17  Blood Culture ID Panel (Reflexed) (Collected: 02/18/2017  6:21 PM)  Result Value Ref Range   Enterococcus species NOT DETECTED NOT DETECTED   Listeria monocytogenes NOT DETECTED NOT DETECTED   Staphylococcus species DETECTED (A) NOT DETECTED   Staphylococcus aureus DETECTED (A) NOT DETECTED   Methicillin resistance DETECTED (A) NOT DETECTED   Streptococcus species NOT DETECTED NOT DETECTED   Streptococcus agalactiae NOT DETECTED NOT DETECTED   Streptococcus pneumoniae NOT DETECTED NOT DETECTED   Streptococcus pyogenes NOT DETECTED NOT DETECTED   Acinetobacter baumannii NOT DETECTED NOT DETECTED   Enterobacteriaceae species NOT DETECTED NOT DETECTED   Enterobacter cloacae complex NOT DETECTED NOT DETECTED   Escherichia coli NOT DETECTED NOT DETECTED   Klebsiella oxytoca NOT DETECTED NOT DETECTED   Klebsiella pneumoniae NOT DETECTED NOT DETECTED   Proteus species NOT DETECTED NOT DETECTED   Serratia marcescens NOT DETECTED NOT DETECTED   Haemophilus influenzae NOT DETECTED NOT DETECTED   Neisseria meningitidis NOT DETECTED NOT DETECTED   Pseudomonas aeruginosa NOT DETECTED NOT DETECTED   Candida albicans NOT DETECTED NOT DETECTED   Candida glabrata NOT DETECTED NOT DETECTED   Candida krusei NOT DETECTED NOT DETECTED   Candida parapsilosis NOT DETECTED NOT DETECTED   Candida tropicalis NOT DETECTED NOT DETECTED    Name of physician (or Provider) Contacted: Dr. Verlon Au  Changes to prescribed antibiotics required: No changes needed yet, already on Vancomycin  Tad Moore 02/19/2017  3:45 PM

## 2017-02-19 NOTE — Consult Note (Signed)
[facility]       Reason for Consult: MRSA bacteremia    Referring Physician: CHAMP autoconsult  Principal Problem:   Wound infection Active Problems:   End-stage renal disease on hemodialysis ([place])   Essential hypertension, benign   Insulin-requiring or dependent type II diabetes mellitus ([place])   Normocytic anemia   Hyponatremia   Right foot infection   . aspirin EC  325 mg Oral Daily  . atorvastatin  40 mg Oral q1800  . [START ON 02/20/2017] carvedilol  6.25 mg Oral 2 times per day on Sun Tue Thu Sat  . carvedilol  6.25 mg Oral Once per day on Mon Wed Fri  . cinacalcet  30 mg Oral Q breakfast  . clopidogrel  75 mg Oral Daily  . darbepoetin (ARANESP) injection - DIALYSIS  60 mcg Intravenous Q Fri-HD  . divalproex  500 mg Oral Q12H  . doxercalciferol  1.5 mcg Intravenous Q M,W,F-HD  . feeding supplement (NEPRO CARB STEADY)  237 mL Oral Q1500  . feeding supplement (PRO-STAT SUGAR FREE 64)  30 mL Oral Daily  . heparin  5,000 Units Subcutaneous Q8H  . insulin aspart  0-5 Units Subcutaneous QHS  . insulin aspart  0-9 Units Subcutaneous TID WC  . insulin glargine  15 Units Subcutaneous QHS  . multivitamin  1 tablet Oral Q M,W,F-2000  . pantoprazole  40 mg Oral Daily  . sevelamer carbonate  2,400 mg Oral TID WC  . sodium chloride flush  3 mL Intravenous Q12H    Recommendations: Vancomycin I will stop ceftriaxone and flagyl  TTE  Assessment: He has MRSA bacteremia in the setting of his right foot draining wound following transmetatarsal amputation.   PAD - required left common femoral artery cannulation for ischemic right foot with gangrene followed by above surgery.  On aspirin and plavix  Antibiotics: Vancomycin day 2 Ceftriaxone day 2 Metronidazole day 2  HPI: Jacob [name] is a 66 y.o. male with ESRD on dialysis, PAD, recent angioplasty, DM, anemia, came in with pain in the right leg following recent transmetatarsal amputation for  ischemic gangrene.  He has had some drainage.  He is currently in dialysis though refusing and does not answer questions.  No report of fever or chills.  Blood cultures now positive 2/2 with MRSA.  Also his spine has posterior epidural process concerning for infection noted on MRI.  WBC 21.3.    Review of Systems:  Constitutional: negative for malaise and anorexia Gastrointestinal: negative for diarrhea All other systems reviewed and are negative    Past Medical History:  Diagnosis Date  . Anemia   . Anemia in chronic kidney disease(285.21)   . Anxiety   . Arthritis   . Depression   . Dyspnea    with exertion  . ESRD (end stage renal disease) on dialysis [facility])    "MWF; [facility], [place]" (02/18/2017)  . Essential hypertension   . GERD (gastroesophageal reflux disease)   . Glaucoma   . History of blood transfusion   . Insomnia   . Nonischemic cardiomyopathy ([place])   . Stroke [facility]) 2016   - "they said it was not a stroke"  . Type 2 diabetes mellitus ([place])    Type II    Social History  Substance Use Topics  . Smoking status: Never Smoker  . Smokeless tobacco: Never Used  . Alcohol use Yes     Comment: 1- fifth of gin a week  Family History  Problem Relation Age of Onset  . Heart attack Brother        39  . Heart disease Brother        before age 22  . Hypertension Brother   . Heart attack Brother        80  . Heart attack Brother        33  . Diabetes Mother   . Hypertension Mother   . Heart disease Mother   . Heart disease Father   . Hypertension Father   . Other Father        amputation  . Hypertension Sister   . Heart disease Sister   . Vision loss Maternal Uncle     Allergies  Allergen Reactions  . Bee Venom Anaphylaxis  . Penicillins Swelling and Other (See Comments)    SWELLING REACTION UNSPECIFIED   Has patient had a PCN reaction causing immediate rash, facial/tongue/throat swelling, SOB or lightheadedness with hypotension: No Has patient had a PCN  reaction causing severe rash involving mucus membranes or skin necrosis: No Has patient had a PCN reaction that required hospitalization No Has patient had a PCN reaction occurring within the last 10 years: No If all of the above answers are "NO", then may proceed with Cephalosporin use.  . Iodine Swelling  . Sulfadiazine Other (See Comments)    1% Silver Sulfadiazine cream causes burning over a large area of skin.    Physical Exam: Constitutional: in no apparent distress  Vitals:   02/19/17 1800 02/19/17 1804  BP: (!) 150/50 (!) 153/56  Pulse: 87 97  Resp: (!) 26   Temp: 98 F (36.7 C)    EYES: anicteric ENMT: dry MM Cardiovascular: Cor RRR Respiratory: CTA B, anterior exam; normal respiratory effort GI: Bowel sounds are normal, liver is not enlarged, spleen is not enlarged, obese Musculoskeletal:  no pedal edema noted Skin: negatives: no rash   Lab Results  Component Value Date   WBC 21.3 (H) 02/19/2017   HGB 7.7 (L) 02/19/2017   HCT 25.9 (L) 02/19/2017   MCV 84.4 02/19/2017   PLT 396 02/19/2017    Lab Results  Component Value Date   CREATININE 6.66 (H) 02/19/2017   BUN 27 (H) 02/19/2017   NA 134 (L) 02/19/2017   K 3.8 02/19/2017   CL 94 (L) 02/19/2017   CO2 27 02/19/2017    Lab Results  Component Value Date   ALT 9 (L) 02/18/2017   AST 19 02/18/2017   ALKPHOS 184 (H) 02/18/2017     Microbiology: Recent Results (from the past 240 hour(s))  Culture, blood (routine x 2)     Status: None (Preliminary result)   Collection Time: 02/18/17  6:21 PM  Result Value Ref Range Status   Specimen Description BLOOD LEFT HAND  Final   Special Requests IN PEDIATRIC BOTTLE Blood Culture adequate volume  Final   Culture  Setup Time   Final    GRAM POSITIVE COCCI IN CLUSTERS AEROBIC BOTTLE ONLY Organism ID to follow CRITICAL RESULT CALLED TO, READ BACK BY AND VERIFIED WITH: L. Huntley Dec.D. 15:30 02/19/17 ([email or web address])    Culture GRAM POSITIVE COCCI  Final   Report Status  PENDING  Incomplete  Blood Culture ID Panel (Reflexed)     Status: Abnormal   Collection Time: 02/18/17  6:21 PM  Result Value Ref Range Status   Enterococcus species NOT DETECTED NOT DETECTED Final   Listeria monocytogenes NOT DETECTED NOT DETECTED Final  Staphylococcus species DETECTED (A) NOT DETECTED Final    Comment: CRITICAL RESULT CALLED TO, READ BACK BY AND VERIFIED WITH: L. Huntley Dec.D. 15:30 02/19/17 ([email or web address])    Staphylococcus aureus DETECTED (A) NOT DETECTED Final    Comment: Methicillin (oxacillin)-resistant Staphylococcus aureus (MRSA). MRSA is predictably resistant to beta-lactam antibiotics (except ceftaroline). Preferred therapy is vancomycin unless clinically contraindicated. Patient requires contact precautions if  hospitalized. CRITICAL RESULT CALLED TO, READ BACK BY AND VERIFIED WITH: L. Huntley Dec.D. 15:30 02/19/17 ([email or web address])    Methicillin resistance DETECTED (A) NOT DETECTED Final    Comment: CRITICAL RESULT CALLED TO, READ BACK BY AND VERIFIED WITH: L. Huntley Dec.D. 15:30 02/19/17 ([email or web address])    Streptococcus species NOT DETECTED NOT DETECTED Final   Streptococcus agalactiae NOT DETECTED NOT DETECTED Final   Streptococcus pneumoniae NOT DETECTED NOT DETECTED Final   Streptococcus pyogenes NOT DETECTED NOT DETECTED Final   Acinetobacter baumannii NOT DETECTED NOT DETECTED Final   Enterobacteriaceae species NOT DETECTED NOT DETECTED Final   Enterobacter cloacae complex NOT DETECTED NOT DETECTED Final   Escherichia coli NOT DETECTED NOT DETECTED Final   Klebsiella oxytoca NOT DETECTED NOT DETECTED Final   Klebsiella pneumoniae NOT DETECTED NOT DETECTED Final   Proteus species NOT DETECTED NOT DETECTED Final   Serratia marcescens NOT DETECTED NOT DETECTED Final   Haemophilus influenzae NOT DETECTED NOT DETECTED Final   Neisseria meningitidis NOT DETECTED NOT DETECTED Final   Pseudomonas aeruginosa NOT DETECTED NOT DETECTED Final   Candida albicans NOT  DETECTED NOT DETECTED Final   Candida glabrata NOT DETECTED NOT DETECTED Final   Candida krusei NOT DETECTED NOT DETECTED Final   Candida parapsilosis NOT DETECTED NOT DETECTED Final   Candida tropicalis NOT DETECTED NOT DETECTED Final  Culture, blood (routine x 2)     Status: None (Preliminary result)   Collection Time: 02/18/17  9:34 PM  Result Value Ref Range Status   Specimen Description BLOOD LEFT ARM  Final   Special Requests   Final    BOTTLES DRAWN AEROBIC AND ANAEROBIC Blood Culture adequate volume   Culture  Setup Time   Final    GRAM POSITIVE COCCI AEROBIC BOTTLE ONLY Organism ID to follow    Culture NO GROWTH < 12 HOURS  Final   Report Status PENDING  Incomplete  MRSA PCR Screening     Status: None   Collection Time: 02/18/17 11:22 PM  Result Value Ref Range Status   MRSA by PCR NEGATIVE NEGATIVE Final    Comment:        The GeneXpert MRSA Assay (FDA approved for NASAL specimens only), is one component of a comprehensive MRSA colonization surveillance program. It is not intended to diagnose MRSA infection nor to guide or monitor treatment for MRSA infections.     Scharlene Gloss, [facility] [place] [email or web address] [phone] pager  [phone] cell 02/19/2017, 6:40 PM

## 2017-02-19 NOTE — Progress Notes (Signed)
PROGRESS NOTE    Jacob [name]  MWN:[id number] DOB: 06/01/1951 DOA: 02/18/2017 PCP: Sharilyn Sites, MD  Outpatient Specialists:   Dr Donzetta Matters  Brief Narrative:  43 resident of [facility], [place] Diabetes mellitus insulin-dependent with neuropathy Anemia of renal disease Hypertension Prior CVA Prior nonischemic cardiomyopathy with improvement  Grade 1 diastolic dysfunction per echo 12/2011  Cataract surgery 2015 ESRD MWF per extremity AV fistula-  recent revascularization right lower extremity posterior tibial/anterior tibial arteries-drug-coated angioma right up pretty artery  Transmetatarsal amputation 61 18-seen at vascular office (7/17 with poor healing  Had been getting wound follow-up at [facility] with silver alginate--outpatient management has failed Admitted through emergency room 02/18/2017 with wound dehiscence marked leukocytosis afebrile Vascular surgery consulted considering surgery Nephrology has been consult    Assessment & Plan:   Principal Problem:   Wound infection Active Problems:   End-stage renal disease on hemodialysis ([place])   Essential hypertension, benign   Insulin-requiring or dependent type II diabetes mellitus ([place])   Normocytic anemia   Hyponatremia   Right foot infection   Wound dehiscence-recent transmetatarsal amputation SIRS without sepsis Abnormal MRI 7/20 suggestive of maybe a discitis?-I do not think that this is actual etiology and we'll reassess the patient 7/21  Continue broad-spectrum Rocephin, Flagyl, vancomycin  Will probably need deep wound culture/amputation as per vascular surgery--Vascular Duplex pendin  Pain control with Percocet every 4 when necessary, Dilaudid 1 mg breakthrough   ESRD MWF presumably secondary to diabetic nephrosclerosis, hypertensive glomerulopathy Secondary hyperparathyroidism Dialyzing via right upper extremity AV fistula Probable anemia of renal disease  Nephrology consulted  Blood pressure low  earlier in admission-may need to adjust meds and change EDW  Continue Sensipar 30 every morning, Renvela 2.4 g 3 times a day meal  Get iron studies--usual hemoglobin ~ 9, now in 7 range-might need IV iron/Aranesp-follow FOBT  Prior CVA  Unclear when this was after review of notes  Continue Plavix, aspirin  Prior nonischemic cardiomyopathy Last echocardiogram EF 55-60% 03/07/2015 HTN  Continue Coreg 6.25 nondialysis days-STTS  Hold lisinopril 20 mg given hypotension  Seems stable otherwise  ? Seizure? Bipolar  Is on Depakote 500 twice a day which she has not taken for 2 months  Indication was alcoholic seizures and he last had a drink 2 months ago    Diabetes mellitus2  Home meds = 20 units at bedtime Lantus  Continue sliding scale, continue lower dose of Lantus 15 at bedtime   Lovenox No family present at dialysis Inpatient Unclear distal   Consultants:   Donzetta Matters, VVS  [facility]   Procedures:    none  Antimicrobials:   As above     Subjective: Awake alert seen on HD unit Slight HOH No new issue Foot wrapped  having 5/10 pain otherwise seems okay   Objective: Vitals:   02/18/17 2215 02/18/17 2321 02/19/17 0151 02/19/17 0553  BP: (!) 153/62 139/61  97/62  Pulse: 88 82  78  Resp:  18  18  Temp:  98.6 F (37 C)  98.2 F (36.8 C)  TempSrc:  Oral  Oral  SpO2: 97% 97%  100%  Weight:  128.4 kg (283 lb) 128.4 kg (283 lb)   Height:  6\' 2"  (1.88 m)      Intake/Output Summary (Last 24 hours) at 02/19/17 0733 Last data filed at 02/19/17 0600  Gross per 24 hour  Intake              810 ml  Output  0 ml  Net              810 ml   Filed Weights   02/18/17 2321 02/19/17 0151  Weight: 128.4 kg (283 lb) 128.4 kg (283 lb)    Examination:  Alert obese hard of hearing Extraocular movements intact No JVD Abdomen soft nontender Foot wounds not examined as recently wrapped Chest clinically clear J6-E8 holosystolic murmur at  Data  Reviewed: I have personally reviewed following labs and imaging studies  CBC:  Recent Labs Lab 02/18/17 1821 02/18/17 2325 02/19/17 0503  WBC 20.8* 19.4* 21.3*  NEUTROABS 17.6*  --  18.1*  HGB 8.0* 7.8* 7.7*  HCT 26.6* 25.6* 25.9*  MCV 84.7 82.8 84.4  PLT 353 368 315   Basic Metabolic Panel:  Recent Labs Lab 02/18/17 1821 02/18/17 2325 02/19/17 0503  NA 131* 130* 134*  K 3.8 3.6 3.8  CL 94* 92* 94*  CO2 25 26 27   GLUCOSE 175* 174* 152*  BUN 24* 25* 27*  CREATININE 6.23* 6.45* 6.66*  CALCIUM 8.5* 8.6* 8.7*  PHOS  --  2.6  --    GFR: Estimated Creatinine Clearance: 15.3 mL/min (A) (by C-G formula based on SCr of 6.66 mg/dL (H)). Liver Function Tests:  Recent Labs Lab 02/18/17 1821 02/18/17 2325  AST 19  --   ALT 9*  --   ALKPHOS 184*  --   BILITOT 0.4  --   PROT 8.1  --   ALBUMIN 2.2* 2.2*   No results for input(s): LIPASE, AMYLASE in the last 168 hours. No results for input(s): AMMONIA in the last 168 hours. Coagulation Profile: No results for input(s): INR, PROTIME in the last 168 hours. Cardiac Enzymes: No results for input(s): CKTOTAL, CKMB, CKMBINDEX, TROPONINI in the last 168 hours. BNP (last 3 results) No results for input(s): PROBNP in the last 8760 hours. HbA1C: No results for input(s): HGBA1C in the last 72 hours. CBG:  Recent Labs Lab 02/18/17 2359 02/19/17 0625  GLUCAP 153* 150*   Lipid Profile: No results for input(s): CHOL, HDL, LDLCALC, TRIG, CHOLHDL, LDLDIRECT in the last 72 hours. Thyroid Function Tests: No results for input(s): TSH, T4TOTAL, FREET4, T3FREE, THYROIDAB in the last 72 hours. Anemia Panel: No results for input(s): VITAMINB12, FOLATE, FERRITIN, TIBC, IRON, RETICCTPCT in the last 72 hours. Urine analysis: No results found for: COLORURINE, APPEARANCEUR, LABSPEC, PHURINE, GLUCOSEU, HGBUR, BILIRUBINUR, KETONESUR, PROTEINUR, UROBILINOGEN, NITRITE, LEUKOCYTESUR Sepsis  Labs: @LABRCNTIP (procalcitonin:4,lacticidven:4)  ) Recent Results (from the past 240 hour(s))  Culture, blood (routine x 2)     Status: None (Preliminary result)   Collection Time: 02/18/17  6:21 PM  Result Value Ref Range Status   Specimen Description BLOOD LEFT HAND  Final   Special Requests IN PEDIATRIC BOTTLE Blood Culture adequate volume  Final   Culture NO GROWTH < 12 HOURS  Final   Report Status PENDING  Incomplete  Culture, blood (routine x 2)     Status: None (Preliminary result)   Collection Time: 02/18/17  9:34 PM  Result Value Ref Range Status   Specimen Description BLOOD LEFT ARM  Final   Special Requests   Final    BOTTLES DRAWN AEROBIC AND ANAEROBIC Blood Culture adequate volume   Culture NO GROWTH < 12 HOURS  Final   Report Status PENDING  Incomplete         Radiology Studies: No results found.      Scheduled Meds: . aspirin EC  325 mg Oral Daily  . atorvastatin  40 mg Oral q1800  . [START ON 02/20/2017] carvedilol  6.25 mg Oral 2 times per day on Sun Tue Thu Sat  . carvedilol  6.25 mg Oral Once per day on Mon Wed Fri  . cinacalcet  30 mg Oral Q breakfast  . clopidogrel  75 mg Oral Daily  . divalproex  500 mg Oral Q12H  . heparin  5,000 Units Subcutaneous Q8H  . insulin aspart  0-5 Units Subcutaneous QHS  . insulin aspart  0-9 Units Subcutaneous TID WC  . insulin glargine  15 Units Subcutaneous QHS  . lisinopril  20 mg Oral Daily  . multivitamin  1 tablet Oral Q M,W,F-2000  . pantoprazole  40 mg Oral Daily  . sevelamer carbonate  2,400 mg Oral TID WC  . sodium chloride flush  3 mL Intravenous Q12H   Continuous Infusions: . sodium chloride    . cefTRIAXone (ROCEPHIN)  IV Stopped (02/19/17 0128)   And  . metronidazole 500 mg (02/19/17 4158)  . vancomycin       LOS: 1 day    Time spent: [place], MD [facility]   If 7PM-7AM, please contact night-coverage [email or web address] Password [facility] [place] 02/19/2017, 7:33 AM

## 2017-02-19 NOTE — Progress Notes (Signed)
Dialysis treatment terminated with 27 minutes remaining d/t pain; nephrology notified and AMA form signed.  1953 mL ultrafiltrated and net fluid removal 1453 mL.    Patient A & O X 4. Lung sounds diminished to ausculation in all fields. Generalized edema. Cardiac: NSR.  Disconnected lines and removed needles.  Pressure held for 15 minutes and band aid/gauze dressing applied.  Report given to charge RN, Maudie Mercury.

## 2017-02-19 NOTE — Progress Notes (Signed)
Initial Nutrition Assessment  DOCUMENTATION CODES:   Obesity unspecified  INTERVENTION:  Provide Nepro Shake po once daily, each supplement provides 425 kcal and 19 grams protein  Provide 30 ml Prostat po once daily, each supplement provides 100 kcal and 15 grams of protein.   Encourage adequate PO intake.   NUTRITION DIAGNOSIS:   Increased nutrient needs related to wound healing as evidenced by estimated needs.  GOAL:   Patient will meet greater than or equal to 90% of their needs  MONITOR:   PO intake, Supplement acceptance, Labs, Weight trends, Skin, I & O's  REASON FOR ASSESSMENT:   Consult Wound healing  ASSESSMENT:   66 y.o. male with medical history significant for end-stage renal disease on hemodialysis, peripheral arterial disease status post angioplasty, insulin-dependent diabetes mellitus, anemia, and right TMA on 01/01/2017 with wound dehiscence, now presenting for evaluation of worsening pain in the right leg and increased drainage from the TMA site.  Pt reports his appetite is fine with no other difficulties. Pt reports eating well at home with usual consumption of at least 2 meals a day with snacks in between. Pt with no significant weight loss per weight records. RD to order nutritional supplements to aid in wound healing. Pt with no observed significant fat or muscle mass loss.   Labs and medications reviewed.   Diet Order:  Diet renal/carb modified with fluid restriction Diet-HS Snack? Nothing; Room service appropriate? Yes; Fluid consistency: Thin  Skin:  Reviewed, no issues  Last BM:  7/17  Height:   Ht Readings from Last 1 Encounters:  02/18/17 6\' 2"  (1.88 m)    Weight:   Wt Readings from Last 1 Encounters:  02/19/17 283 lb (128.4 kg)    Ideal Body Weight:  86.36 kg  BMI:  Body mass index is 36.34 kg/m.  Estimated Nutritional Needs:   Kcal:  2150-2350  Protein:  125-140 grams  Fluid:  Per MD  EDUCATION NEEDS:   No education  needs identified at this time  Corrin Parker, MS, RD, LDN Pager # [phone] After hours/ weekend pager # [phone]

## 2017-02-19 NOTE — Progress Notes (Signed)
Patient arrived to unit per bed.  Reviewed treatment plan and this RN agrees.  Report received from bedside RN, Andi Hence.  Consent verified.  Patient A & O X 4. Lung sounds diminished to ausculation in all fields. Generalized edema. Cardiac: NSR.  Prepped RUAVF with alcohol and cannulated with two 15 gauge needles.  Pulsation of blood noted.  Flushed access well with saline per protocol.  Connected and secured lines and initiated tx at 1427.  UF goal of 2500 mL and net fluid removal of 2000 mL.  Will continue to monitor.  Fistula venous access required two sticks with multiple fibrous clots aspirated on initial access.  Second access attempt successful.

## 2017-02-19 NOTE — Consult Note (Signed)
I was present at this dialysis session. I have reviewed the session itself and made appropriate changes.   3K bath.  AVF. Had some venous clots at first cannulation, monitor.    Filed Weights   02/18/17 2321 02/19/17 0151 02/19/17 1404  Weight: 128.4 kg (283 lb) 128.4 kg (283 lb) 131.2 kg (289 lb 3.9 oz)     Recent Labs Lab 02/18/17 2325 02/19/17 0503  NA 130* 134*  K 3.6 3.8  CL 92* 94*  CO2 26 27  GLUCOSE 174* 152*  BUN 25* 27*  CREATININE 6.45* 6.66*  CALCIUM 8.6* 8.7*  PHOS 2.6  --      Recent Labs Lab 02/18/17 1821 02/18/17 2325 02/19/17 0503  WBC 20.8* 19.4* 21.3*  NEUTROABS 17.6*  --  18.1*  HGB 8.0* 7.8* 7.7*  HCT 26.6* 25.6* 25.9*  MCV 84.7 82.8 84.4  PLT 353 368 396    Scheduled Meds: . aspirin EC  325 mg Oral Daily  . atorvastatin  40 mg Oral q1800  . [START ON 02/20/2017] carvedilol  6.25 mg Oral 2 times per day on Sun Tue Thu Sat  . carvedilol  6.25 mg Oral Once per day on Mon Wed Fri  . cinacalcet  30 mg Oral Q breakfast  . clopidogrel  75 mg Oral Daily  . divalproex  500 mg Oral Q12H  . feeding supplement (NEPRO CARB STEADY)  237 mL Oral Q1500  . feeding supplement (PRO-STAT SUGAR FREE 64)  30 mL Oral Daily  . heparin  5,000 Units Subcutaneous Q8H  . insulin aspart  0-5 Units Subcutaneous QHS  . insulin aspart  0-9 Units Subcutaneous TID WC  . insulin glargine  15 Units Subcutaneous QHS  . multivitamin  1 tablet Oral Q M,W,F-2000  . pantoprazole  40 mg Oral Daily  . sevelamer carbonate  2,400 mg Oral TID WC  . sodium chloride flush  3 mL Intravenous Q12H   Continuous Infusions: . sodium chloride    . sodium chloride    . sodium chloride    . cefTRIAXone (ROCEPHIN)  IV Stopped (02/19/17 0128)   And  . metronidazole Stopped (02/19/17 0705)  . vancomycin     PRN Meds:.sodium chloride, sodium chloride, sodium chloride, acetaminophen, bisacodyl, dextromethorphan, heparin, heparin, heparin, lidocaine (PF), lidocaine-prilocaine, loratadine,  ondansetron **OR** ondansetron (ZOFRAN) IV, oxyCODONE-acetaminophen, pentafluoroprop-tetrafluoroeth, sevelamer carbonate, sodium chloride flush   Pearson Grippe  MD 02/19/2017, 2:51 PM

## 2017-02-19 NOTE — Consult Note (Addendum)
Jacob [name] Admit Date: 02/18/2017 02/19/2017 Jacob [name] Requesting Physician:  Jacob [name]  Reason for Consult:  ESRD comanagement HPI:  66M admitted 7/19 with wound infection and dehiscence at right TMA.  Patient seen with sister, Jacob [name], and room.  PMH Incudes:  ESRD, Monday, Wednesday, Friday, via right upper extremity AV fistula, at [facility], last treatment 7/18.  He states recent dialysis sessions have gone well and offers no specific complaints or concerns about his current therapy.  ASCVD, significant right lower extremity PAD, status post recent endovascular procedures followed by right TMA on 01/01/17 with Dr. Trula Slade; subsequent poor wound healing followed closely in the outpatient setting  DM 2 on insulin  Hypertension  Secondary hyperparathyroidism on Sensipar  Hyperphosphatemia on sevelamer GERD on chronic PPI therapy  Patient feels well today. He ate an entire breakfast. Surgical plan is in process for his foot. He is being treated with vancomycin, ceftriaxone, metronidazole. White count is elevated at 21. Other labs are stable and charted below. Blood cultures were obtained admission, no growth to date at less than 24 hours.     Creatinine, Ser (mg/dL)  Date Value  02/19/2017 6.66 (H)  02/18/2017 6.45 (H)  02/18/2017 6.23 (H)  01/05/2017 6.71 (H)  01/04/2017 8.29 (H)  01/03/2017 7.06 (H)  01/02/2017 6.07 (H)  01/02/2017 5.29 (H)  01/01/2017 7.16 (H)  12/31/2016 5.32 (H)  ]  ROS Balance of 12 systems is negative w/ exceptions as above  PMH  Past Medical History:  Diagnosis Date  . Anemia   . Anemia in chronic kidney disease(285.21)   . Anxiety   . Arthritis   . Depression   . Dyspnea    with exertion  . ESRD (end stage renal disease) on dialysis [facility])    "MWF; [facility] [place]" (02/18/2017)  . Essential hypertension   . GERD (gastroesophageal reflux disease)   . Glaucoma   . History of blood transfusion   . Insomnia   . Nonischemic  cardiomyopathy ([place])   . Stroke [facility]) 2016   - "they said it was not a stroke"  . Type 2 diabetes mellitus (HCC)    Type II   PSH  Past Surgical History:  Procedure Laterality Date  . A/V SHUNTOGRAM Right 10/06/2016   Procedure: A/V Fistulagram;  Surgeon: Jacob Mitchell, MD;  Location: [place] [facility];  Service: Cardiovascular;  Laterality: Right;  . ABDOMINAL AORTOGRAM N/A 12/30/2016   Procedure: Abdominal Aortogram;  Surgeon: Jacob Sandy, MD;  Location: [place] [facility];  Service: Cardiovascular;  Laterality: N/A;  . AV FISTULA PLACEMENT     Hx: of  . AV FISTULA PLACEMENT Right 05/04/2013   Procedure: ARTERIOVENOUS (AV) FISTULA CREATION- RIGHT ARM;  Surgeon: Jacob Miles, MD;  Location: [place];  Service: Vascular;  Laterality: Right;  Ultrasound guided  . AV FISTULA PLACEMENT Right 07/28/2016   Procedure: BRACHIOCEPHALIC ARTERIOVENOUS (AV) FISTULA CREATION;  Surgeon: Jacob Mould, MD;  Location: [place];  Service: Vascular;  Laterality: Right;  . CATARACT EXTRACTION W/PHACO Left 07/16/2014   Procedure: CATARACT EXTRACTION PHACO AND INTRAOCULAR LENS PLACEMENT (IOC);  Surgeon: Jacob Branch, MD;  Location: [facility];  Service: Ophthalmology;  Laterality: Left;  CDE:8.86  . CATARACT EXTRACTION W/PHACO Right 07/30/2014   Procedure: CATARACT EXTRACTION PHACO AND INTRAOCULAR LENS PLACEMENT (IOC);  Surgeon: Jacob Branch, MD;  Location: [facility];  Service: Ophthalmology;  Laterality: Right;  CDE 8.99  . COLONOSCOPY     Hx: of  . FISTULA SUPERFICIALIZATION  Right 07/28/2016   Procedure: FISTULA SUPERFICIALIZATION;  Surgeon: Jacob Mould, MD;  Location: [place];  Service: Vascular;  Laterality: Right;  . FISTULA SUPERFICIALIZATION Right 10/13/2016   Procedure: BRACHIOCEPHALIC ARTERIOVENOUS FISTULA SUPERFICIALIZATION;  Surgeon: Jacob Mould, MD;  Location: [place];  Service: Vascular;  Laterality: Right;  . FISTULOGRAM N/A 05/04/2013   Procedure: CENTRAL VENOGRAM;   Surgeon: Jacob Goodyears Bar, MD;  Location: [place];  Service: Vascular;  Laterality: N/A;  . INSERTION OF DIALYSIS CATHETER Right 05/04/2013   Procedure: INSERTION OF DIALYSIS CATHETER;  Surgeon: Jacob Hico, MD;  Location: [place];  Service: Vascular;  Laterality: Right;  Ultrasound guided  . INSERTION OF DIALYSIS CATHETER N/A 07/28/2016   Procedure: INSERTION OF DIALYSIS CATHETER;  Surgeon: Jacob Mould, MD;  Location: [place];  Service: Vascular;  Laterality: N/A;  . LIGATION OF ARTERIOVENOUS  FISTULA Left 05/04/2013   Procedure: LIGATION OF LEFT RADIAL CEPHALIC ARTERIOVENOUS  FISTULA;  Surgeon: Jacob Kensington, MD;  Location: [place];  Service: Vascular;  Laterality: Left;  Ultrasound guided  . LIGATION OF ARTERIOVENOUS  FISTULA Right 07/28/2016   Procedure: LIGATION OF RADIOCEPHALIC ARTERIOVENOUS  FISTULA;  Surgeon: Jacob Mould, MD;  Location: [place];  Service: Vascular;  Laterality: Right;  . LOWER EXTREMITY ANGIOGRAPHY N/A 12/30/2016   Procedure: Lower Extremity Angiography;  Surgeon: Jacob Sandy, MD;  Location: [place] [facility];  Service: Cardiovascular;  Laterality: N/A;  . LUMBAR SPINE SURGERY    . PERIPHERAL VASCULAR ATHERECTOMY Right 12/30/2016   Procedure: Peripheral Vascular Atherectomy;  Surgeon: Jacob Sandy, MD;  Location: [place] [facility];  Service: Cardiovascular;  Laterality: Right;  AT and PT  . PERIPHERAL VASCULAR BALLOON ANGIOPLASTY Right 12/30/2016   Procedure: Peripheral Vascular Balloon Angioplasty;  Surgeon: Jacob Sandy, MD;  Location: [place] [facility];  Service: Cardiovascular;  Laterality: Right;  PTA of PT and AT  . PERIPHERAL VASCULAR CATHETERIZATION N/A 01/24/2015   Procedure: Fistulagram;  Surgeon: Jacob Reardan, MD;  Location: [place] [facility];  Service: Cardiovascular;  Laterality: N/A;  . PERIPHERAL VASCULAR CATHETERIZATION Right 06/04/2016   Procedure: A/V Shuntogram/Fistulagram;  Surgeon: Jacob Piqua, MD;  Location:  [place] [facility];  Service: Cardiovascular;  Laterality: Right;  . REVISON OF ARTERIOVENOUS FISTULA Right 01/02/2014   Procedure: REVISON OF ARTERIOVENOUS FISTULA ANASTOMOSIS;  Surgeon: Jacob Pawnee, MD;  Location: [place];  Service: Vascular;  Laterality: Right;  . REVISON OF ARTERIOVENOUS FISTULA Right 01/02/2016   Procedure: REVISION OF RADIOCEPHALIC ARTERIOVENOUS FISTULA  with BOVINE PATCH ANGIOPLASTY RIGHT RADIAL ARTERY;  Surgeon: Mal [name], MD;  Location: [place];  Service: Vascular;  Laterality: Right;  . SHUNTOGRAM N/A 11/07/2013   Procedure: Fistulogram;  Surgeon: Jacob Mitchell, MD;  Location: [facility];  Service: Cardiovascular;  Laterality: N/A;  . TRANSMETATARSAL AMPUTATION Right 01/01/2017   Procedure: TRANSMETATARSAL AMPUTATION;  Surgeon: Jacob Mitchell, MD;  Location: [facility] OR;  Service: Vascular;  Laterality: Right;   FH  Family History  Problem Relation Age of Onset  . Heart attack Brother        59  . Heart disease Brother        before age 63  . Hypertension Brother   . Heart attack Brother        56  . Heart attack Brother        23  . Diabetes Mother   . Hypertension Mother   . Heart disease Mother   .  Heart disease Father   . Hypertension Father   . Other Father        amputation  . Hypertension Sister   . Heart disease Sister   . Vision loss Maternal Uncle    SH  reports that he has never smoked. He has never used smokeless tobacco. He reports that he drinks alcohol. He reports that he does not use drugs. Allergies  Allergies  Allergen Reactions  . Bee Venom Anaphylaxis  . Penicillins Swelling and Other (See Comments)    SWELLING REACTION UNSPECIFIED   Has patient had a PCN reaction causing immediate rash, facial/tongue/throat swelling, SOB or lightheadedness with hypotension: No Has patient had a PCN reaction causing severe rash involving mucus membranes or skin necrosis: No Has patient had a PCN reaction that required hospitalization No Has patient  had a PCN reaction occurring within the last 10 years: No If all of the above answers are "NO", then may proceed with Cephalosporin use.  . Iodine Swelling  . Sulfadiazine Other (See Comments)    1% Silver Sulfadiazine cream causes burning over a large area of skin.   Home medications Prior to Admission medications   Medication Sig Start Date End Date Taking? Authorizing Provider  acetaminophen (TYLENOL) 500 MG tablet Take 1,000 mg by mouth every 6 (six) hours as needed for mild pain.   Yes [provider]  aspirin EC 325 MG tablet Take 325 mg by mouth daily.   Yes [provider]  B Complex-C-Folic Acid (DIALYVITE TABLET) TABS Take 1 tablet by mouth every Monday, Wednesday, and Friday.    Yes [provider]  bisacodyl (DULCOLAX) 5 MG EC tablet Take 2 tablets (10 mg total) by mouth daily as needed for moderate constipation. 01/04/17  Yes Mikhail, Velta Addison, DO  cinacalcet (SENSIPAR) 30 MG tablet Take 30 mg by mouth daily.   Yes [provider]  dextromethorphan (ROBITUSSIN 12 HOUR COUGH) 30 MG/5ML liquid Take by mouth as needed for cough.   Yes [provider]  EPIPEN 2-PAK 0.3 MG/0.3ML SOAJ injection Inject 0.3 mg into the muscle once as needed (For anaphylaxis.).  04/18/13  Yes [provider]  LANTUS SOLOSTAR 100 UNIT/ML Solostar Pen Inject 20 Units into the skin at bedtime. If blood sugar is lower than 150 only inject 20 units at bedtime 01/15/15  Yes [provider]  lisinopril (PRINIVIL,ZESTRIL) 20 MG tablet Take 20 mg by mouth daily.   Yes [provider]  loratadine (CLARITIN) 10 MG tablet Take 10 mg by mouth daily as needed for allergies.   Yes [provider]  omeprazole (PRILOSEC) 40 MG capsule Take 40 mg by mouth daily.    Yes [provider]  ramipril (ALTACE) 10 MG capsule Take 10 mg by mouth daily.    Yes [provider]  RENVELA 800 MG tablet Take 800-2,400 mg by mouth 3 (three) times  daily with meals. Take 2400 mg by mouth 3 times daily with meals and 800 mg by mouth with snacks 05/25/13  Yes [provider]  atorvastatin (LIPITOR) 40 MG tablet Take 40 mg by mouth daily.    [provider]  carvedilol (COREG) 6.25 MG tablet Take 6.25 mg by mouth 2 (two) times daily with a meal. Take 6.5mg s once daily on dialysis days, Mondays, Wednesdays, and Fridays    [provider]  clopidogrel (PLAVIX) 75 MG tablet Take 1 tablet (75 mg total) by mouth daily with breakfast. Patient not taking: Reported on 02/18/2017 01/05/17  Mikhail, Velta Addison, DO  divalproex (DEPAKOTE) 500 MG DR tablet Take 1 tablet (500 mg total) by mouth every 12 (twelve) hours. Patient not taking: Reported on 02/18/2017 01/04/17   Cristal Ford, DO  oxyCODONE-acetaminophen (PERCOCET/ROXICET) 5-325 MG tablet Take 1 tablet by mouth every 4 (four) hours as needed for moderate pain. Patient not taking: Reported on 02/18/2017 01/04/17   Cristal Ford, DO    Current Medications Scheduled Meds: . aspirin EC  325 mg Oral Daily  . atorvastatin  40 mg Oral q1800  . [START ON 02/20/2017] carvedilol  6.25 mg Oral 2 times per day on Sun Tue Thu Sat  . carvedilol  6.25 mg Oral Once per day on Mon Wed Fri  . cinacalcet  30 mg Oral Q breakfast  . clopidogrel  75 mg Oral Daily  . divalproex  500 mg Oral Q12H  . heparin  5,000 Units Subcutaneous Q8H  . insulin aspart  0-5 Units Subcutaneous QHS  . insulin aspart  0-9 Units Subcutaneous TID WC  . insulin glargine  15 Units Subcutaneous QHS  . multivitamin  1 tablet Oral Q M,W,F-2000  . pantoprazole  40 mg Oral Daily  . sevelamer carbonate  2,400 mg Oral TID WC  . sodium chloride flush  3 mL Intravenous Q12H   Continuous Infusions: . sodium chloride    . cefTRIAXone (ROCEPHIN)  IV Stopped (02/19/17 0128)   And  . metronidazole Stopped (02/19/17 0705)  . vancomycin     PRN Meds:.sodium chloride, acetaminophen, bisacodyl, dextromethorphan, heparin,  loratadine, ondansetron **OR** ondansetron (ZOFRAN) IV, oxyCODONE-acetaminophen, sevelamer carbonate, sodium chloride flush  CBC  Recent Labs Lab 02/18/17 1821 02/18/17 2325 02/19/17 0503  WBC 20.8* 19.4* 21.3*  NEUTROABS 17.6*  --  18.1*  HGB 8.0* 7.8* 7.7*  HCT 26.6* 25.6* 25.9*  MCV 84.7 82.8 84.4  PLT 353 368 408   Basic Metabolic Panel  Recent Labs Lab 02/18/17 1821 02/18/17 2325 02/19/17 0503  NA 131* 130* 134*  K 3.8 3.6 3.8  CL 94* 92* 94*  CO2 25 26 27   GLUCOSE 175* 174* 152*  BUN 24* 25* 27*  CREATININE 6.23* 6.45* 6.66*  CALCIUM 8.5* 8.6* 8.7*  PHOS  --  2.6  --     Physical Exam  Blood pressure 97/62, pulse 78, temperature 98.2 F (36.8 C), temperature source Oral, resp. rate 18, height 6\' 2"  (1.88 m), weight 128.4 kg (283 lb), SpO2 100 %. GEN: NAD, well appearing ENT: NCAT EYES: EOMI CV: RRR, no rub, nl s1s2 PULM: CTAB, nl wob ABD: s/t/nd' +BS SKIN: RLE bandaged, not examined, no rashes elsewhwere EXT:No LEE AVF RUE : +B/T  Outpt HD Orders Unit: [facility] Days: MWF Time: 4.5h Dialyzer: Revaclr 400 EDW: 126kg K/Ca: 2/2.5 Access: RUE AVF Needle Size: 14g BFR/DFR: 450/600 VDRA: hectorol 1.43mcg qTx EPO: 7800 IVB qTx IV Fe: none Heparin: 2000 IVB and 1600 qhr continuous   Assessment 55M ESRD MWF RUE AVF  [facility] with R TMA site infection and poor healing.  1. ESRD MWF RUE AVF 2. S/p R TMA 01/01/17 with poor healing and drainage, on IV ABX 3. Anemia; worsened with acute health issues 4. Leukocytosis, neutrophilia 2/2 #2 5. DM2 6. PAD  Plan 1. Dialysis today: 3K, 2.5 Ca, 2L UF, Tight heparin 2. Start ESA: aranesp 60 qFri 3. Resume VDRA  Pearson Grippe MD [phone] pgr 02/19/2017, 8:49 AM

## 2017-02-20 ENCOUNTER — Inpatient Hospital Stay (HOSPITAL_COMMUNITY): Payer: Medicare Other

## 2017-02-20 DIAGNOSIS — R7881 Bacteremia: Secondary | ICD-10-CM

## 2017-02-20 DIAGNOSIS — I719 Aortic aneurysm of unspecified site, without rupture: Secondary | ICD-10-CM

## 2017-02-20 DIAGNOSIS — I739 Peripheral vascular disease, unspecified: Secondary | ICD-10-CM

## 2017-02-20 LAB — IRON AND TIBC
Iron: 10 ug/dL — ABNORMAL LOW (ref 45–182)
Saturation Ratios: 9 % — ABNORMAL LOW (ref 17.9–39.5)
TIBC: 106 ug/dL — AB (ref 250–450)
UIBC: 96 ug/dL

## 2017-02-20 LAB — ECHOCARDIOGRAM COMPLETE
HEIGHTINCHES: 74 in
WEIGHTICAEL: 4440.95 [oz_av]

## 2017-02-20 LAB — GLUCOSE, CAPILLARY
GLUCOSE-CAPILLARY: 108 mg/dL — AB (ref 65–99)
GLUCOSE-CAPILLARY: 144 mg/dL — AB (ref 65–99)
Glucose-Capillary: 151 mg/dL — ABNORMAL HIGH (ref 65–99)
Glucose-Capillary: 129 mg/dL — ABNORMAL HIGH (ref 65–99)

## 2017-02-20 LAB — FERRITIN: FERRITIN: 909 ng/mL — AB (ref 24–336)

## 2017-02-20 MED ORDER — CHLORHEXIDINE GLUCONATE CLOTH 2 % EX PADS
6.0000 | MEDICATED_PAD | Freq: Every day | CUTANEOUS | Status: AC
  Administered 2017-02-20 – 2017-02-23 (×5): 6 via TOPICAL

## 2017-02-20 MED ORDER — OXYCODONE-ACETAMINOPHEN 5-325 MG PO TABS: 1.0000 | ORAL_TABLET | ORAL | Status: DC | PRN

## 2017-02-20 MED ORDER — OXYCODONE-ACETAMINOPHEN 5-325 MG PO TABS
1.0000 | ORAL_TABLET | ORAL | Status: DC | PRN
  Administered 2017-02-20: 2 via ORAL
  Filled 2017-02-20: qty 2

## 2017-02-20 MED ORDER — OXYCODONE-ACETAMINOPHEN 5-325 MG PO TABS
1.0000 | ORAL_TABLET | ORAL | Status: DC | PRN
  Administered 2017-02-21 – 2017-02-23 (×6): 1 via ORAL
  Filled 2017-02-20 (×6): qty 1

## 2017-02-20 MED ORDER — MUPIROCIN 2 % EX OINT
1.0000 "application " | TOPICAL_OINTMENT | Freq: Two times a day (BID) | CUTANEOUS | Status: AC
  Administered 2017-02-20 – 2017-02-24 (×10): 1 via NASAL
  Filled 2017-02-20 (×4): qty 22

## 2017-02-20 MED ORDER — FINASTERIDE 5 MG PO TABS
5.0000 mg | ORAL_TABLET | Freq: Every day | ORAL | Status: DC
  Administered 2017-02-20 – 2017-03-03 (×11): 5 mg via ORAL
  Filled 2017-02-20 (×11): qty 1

## 2017-02-20 NOTE — Progress Notes (Signed)
Admit: 02/18/2017 LOS: 2  68M ESRD MWF RUE AVF  [place] with R TMA site infection and poor healing.  Subjective:  HD yesterday; s/o early, 2L UF 7/19 B Cx x2 +GPC in clusters MRI L Spine suggest chornic epidural infection Seen by [facility], ABX narrowed to vancomycin  07/20 0701 - 07/21 0700 In: 520 [P.O.:320; IV Piggyback:200] Out: 1453   Filed Weights   02/19/17 1404 02/19/17 1800 02/20/17 0542  Weight: 131.2 kg (289 lb 3.9 oz) 129.8 kg (286 lb 2.5 oz) 125.9 kg (277 lb 9 oz)    Scheduled Meds: . aspirin EC  325 mg Oral Daily  . atorvastatin  40 mg Oral q1800  . carvedilol  6.25 mg Oral 2 times per day on Sun Tue Thu Sat  . carvedilol  6.25 mg Oral Once per day on Mon Wed Fri  . Chlorhexidine Gluconate Cloth  6 each Topical Q0600  . cinacalcet  30 mg Oral Q breakfast  . clopidogrel  75 mg Oral Daily  . darbepoetin (ARANESP) injection - DIALYSIS  60 mcg Intravenous Q Fri-HD  . divalproex  500 mg Oral Q12H  . doxercalciferol  1.5 mcg Intravenous Q M,W,F-HD  . feeding supplement (NEPRO CARB STEADY)  237 mL Oral Q1500  . feeding supplement (PRO-STAT SUGAR FREE 64)  30 mL Oral Daily  . heparin  5,000 Units Subcutaneous Q8H  . insulin aspart  0-5 Units Subcutaneous QHS  . insulin aspart  0-9 Units Subcutaneous TID WC  . insulin glargine  15 Units Subcutaneous QHS  . multivitamin  1 tablet Oral Q M,W,F-2000  . mupirocin ointment  1 application Nasal BID  . pantoprazole  40 mg Oral Daily  . sevelamer carbonate  2,400 mg Oral TID WC  . sodium chloride flush  3 mL Intravenous Q12H   Continuous Infusions: . sodium chloride    . vancomycin 1,000 mg (02/19/17 1717)   PRN Meds:.sodium chloride, acetaminophen, albuterol, bisacodyl, dextromethorphan, loratadine, ondansetron **OR** ondansetron (ZOFRAN) IV, oxyCODONE-acetaminophen, sevelamer carbonate, sodium chloride flush  Current Labs: reviewed    Physical Exam:  Blood pressure (!) 141/62, pulse 92, temperature 98.5 F (36.9 C),  temperature source Oral, resp. rate (!) 26, height 6\' 2"  (1.88 m), weight 125.9 kg (277 lb 9 oz), SpO2 97 %. GEN: NAD, well appearing ENT: NCAT EYES: EOMI CV: RRR, no rub, nl s1s2 PULM: CTAB, nl wob ABD: s/t/nd' +BS SKIN: RLE bandaged, not examined, no rashes elsewhwere EXT:No LEE AVF RUE : +B/T  Outpt HD Orders Unit: [facility] Days: MWF Time: 4.5h Dialyzer: Revaclr 400 EDW: 126kg K/Ca: 2/2.5 Access: RUE AVF Needle Size: 14g BFR/DFR: 450/600 VDRA: hectorol 1.74mcg qTx EPO: 7800 IVB qTx IV Fe: none Heparin: 2000 IVB and 1600 qhr continuous  A 1. ESRD MWF RUE AVF 2. S/p R TMA 01/01/17 with poor healing and drainage, MRSA infection, on Vanc 3. MRSA Bacteremia 4. Likely chronic epidural infection 5. Anemia; worsened with acute health issues; TSAT 9% and ferritin 909 6. Leukocytosis, neutrophilia 2/2 #2 7. DM2 8. PAD  Plan 1. Cont HD on MWF schedule 2. ABX per [facility] 3. VVS to eval TMA, further therapy likely to require more proximal amputation 4. I would not give IV Fe at this time with active infection, cont ESA, rec yesterday    Pearson Grippe MD 02/20/2017, 9:32 AM   Recent Labs Lab 02/18/17 1821 02/18/17 2325 02/19/17 0503  NA 131* 130* 134*  K 3.8 3.6 3.8  CL 94* 92* 94*  CO2  25 26 27   GLUCOSE 175* 174* 152*  BUN 24* 25* 27*  CREATININE 6.23* 6.45* 6.66*  CALCIUM 8.5* 8.6* 8.7*  PHOS  --  2.6  --     Recent Labs Lab 02/18/17 1821 02/18/17 2325 02/19/17 0503  WBC 20.8* 19.4* 21.3*  NEUTROABS 17.6*  --  18.1*  HGB 8.0* 7.8* 7.7*  HCT 26.6* 25.6* 25.9*  MCV 84.7 82.8 84.4  PLT 353 368 396

## 2017-02-20 NOTE — Progress Notes (Signed)
PROGRESS NOTE    Jacob [name]  EHU:[id number] DOB: Apr 25, 1951 DOA: 02/18/2017 PCP: Sharilyn Sites, MD  Outpatient Specialists:   Dr Donzetta Matters  Brief Narrative:   26 resident of [facility], [place] Diabetes mellitus insulin-dependent with neuropathy Anemia of renal disease Hypertension Prior CVA Prior nonischemic cardiomyopathy with improvement  Grade 1 diastolic dysfunction per echo 12/2011  Cataract surgery 2015 ESRD MWF per extremity AV fistula-  recent revascularization right lower extremity posterior tibial/anterior tibial arteries-drug-coated angioma right up pretty artery  Transmetatarsal amputation 6.1.18-seen at vascular office 7/17 with poor healing  Had been getting wound follow-up at [facility] with silver alginate--outpatient management has failed  Admitted through emergency room 02/18/2017 with wound dehiscence marked leukocytosis afebrile Vascular surgery consulted considering surgery Nephrology has been consult  Awaiting vasc discussion with patient re: options  Assessment & Plan:   Principal Problem:   Wound infection Active Problems:   End-stage renal disease on hemodialysis ([place])   Essential hypertension, benign   Insulin-requiring or dependent type II diabetes mellitus ([place])   Normocytic anemia   Hyponatremia   Right foot infection   Wound dehiscence-recent transmetatarsal amputation Sepsis-source is forefoot infection MRSA bacteremia Abnormal MRI 7/20 suggestive of maybe a discitis?-I do not think that this is actual etiology  Continue vancomycin  Pain control with Percocet every 4 when necessary, Dilaudid 1 mg breakthrough--do not escalate pain meds given somnolence  most of the day 7/21  Vascular surgery indicates need amputation  ESRD MWF presumably secondary to diabetic nephrosclerosis, hypertensive glomerulopathy Secondary hyperparathyroidism Dialyzing via right upper extremity AV fistula anemia of renal disease  Nephrology consulted  Blood  pressure low earlier in admission-has resolved--was 2/2 to sepsis + infection  Continue Sensipar 30 every morning, Renvela 2.4 g 3 times a day meal   iron studies-low Fe and tsat--usual hemoglobin ~ 9, now in 7 range-defer to nephro  Prior CVA  Unclear when this was after review of notes  Continue Plavix, aspirin  Prior nonischemic cardiomyopathy Last echocardiogram EF 55-60% 03/07/2015 HTN  Continue Coreg 6.25 nondialysis days-STTS--increase if trends stay above 140  Hold lisinopril 20 mg given hypotension  Seems stable otherwise  ? Seizure? Bipolar  Is on Depakote 500 twice a day which she has not taken for 2 months  Indication was alcoholic seizures and he last had a drink 2 months ago  D/c depakote for now   Diabetes mellitus2  Home meds = 20 units at bedtime Lantus  Continue sliding scale, continue lower dose of Lantus 15 at bedtime  Sugars are 100-150   Lovenox called family 7/21 no answer Inpatient ? Likely BKA   Consultants:   Donzetta Matters, VVS  [facility]   Procedures:    none  Antimicrobials:   As above     Subjective:  asleep Nursing reports in pain when awake as sooon as aroused from sleep, but then somnolent most of the day 2/2 to timing of nacroctis cannot really assess him as falling asleep   Objective: Vitals:   02/19/17 1800 02/19/17 1804 02/19/17 1851 02/20/17 0542  BP: (!) 150/50 (!) 153/56 (!) 134/58 (!) 141/62  Pulse: 87 97 96 92  Resp: (!) 26     Temp: 98 F (36.7 C)  99.8 F (37.7 C) 98.5 F (36.9 C)  TempSrc:   Oral Oral  SpO2:   97% 97%  Weight: 129.8 kg (286 lb 2.5 oz)   125.9 kg (277 lb 9 oz)  Height:        Intake/Output  Summary (Last 24 hours) at 02/20/17 1027 Last data filed at 02/20/17 0542  Gross per 24 hour  Intake              200 ml  Output             1453 ml  Net            -1253 ml   Filed Weights   02/19/17 1404 02/19/17 1800 02/20/17 0542  Weight: 131.2 kg (289 lb 3.9 oz) 129.8 kg (286 lb 2.5 oz) 125.9  kg (277 lb 9 oz)    Examination:  Alert obese hard of hearing sleepy Abdomen soft nontender Foot wounds not examined as recently wrapped Chest clinically clear HSM, slight tachy Neuro intact  Data Reviewed: I have personally reviewed following labs and imaging studies  CBC:  Recent Labs Lab 02/18/17 1821 02/18/17 2325 02/19/17 0503  WBC 20.8* 19.4* 21.3*  NEUTROABS 17.6*  --  18.1*  HGB 8.0* 7.8* 7.7*  HCT 26.6* 25.6* 25.9*  MCV 84.7 82.8 84.4  PLT 353 368 993   Basic Metabolic Panel:  Recent Labs Lab 02/18/17 1821 02/18/17 2325 02/19/17 0503  NA 131* 130* 134*  K 3.8 3.6 3.8  CL 94* 92* 94*  CO2 25 26 27   GLUCOSE 175* 174* 152*  BUN 24* 25* 27*  CREATININE 6.23* 6.45* 6.66*  CALCIUM 8.5* 8.6* 8.7*  PHOS  --  2.6  --    GFR: Estimated Creatinine Clearance: 15.2 mL/min (A) (by C-G formula based on SCr of 6.66 mg/dL (H)). Liver Function Tests:  Recent Labs Lab 02/18/17 1821 02/18/17 2325  AST 19  --   ALT 9*  --   ALKPHOS 184*  --   BILITOT 0.4  --   PROT 8.1  --   ALBUMIN 2.2* 2.2*   No results for input(s): LIPASE, AMYLASE in the last 168 hours. No results for input(s): AMMONIA in the last 168 hours. Coagulation Profile: No results for input(s): INR, PROTIME in the last 168 hours. Cardiac Enzymes: No results for input(s): CKTOTAL, CKMB, CKMBINDEX, TROPONINI in the last 168 hours. BNP (last 3 results) No results for input(s): PROBNP in the last 8760 hours. HbA1C: No results for input(s): HGBA1C in the last 72 hours. CBG:  Recent Labs Lab 02/19/17 0625 02/19/17 1157 02/19/17 1857 02/19/17 2153 02/20/17 0629  GLUCAP 150* 124* 92 88 108*   Lipid Profile: No results for input(s): CHOL, HDL, LDLCALC, TRIG, CHOLHDL, LDLDIRECT in the last 72 hours. Thyroid Function Tests: No results for input(s): TSH, T4TOTAL, FREET4, T3FREE, THYROIDAB in the last 72 hours. Anemia Panel:  Recent Labs  02/20/17 0520  FERRITIN 909*  TIBC 106*  IRON  10*   Urine analysis: No results found for: COLORURINE, APPEARANCEUR, LABSPEC, PHURINE, GLUCOSEU, HGBUR, BILIRUBINUR, KETONESUR, PROTEINUR, UROBILINOGEN, NITRITE, LEUKOCYTESUR Sepsis Labs: @LABRCNTIP (procalcitonin:4,lacticidven:4)  ) Recent Results (from the past 240 hour(s))  Culture, blood (routine x 2)     Status: Abnormal (Preliminary result)   Collection Time: 02/18/17  6:21 PM  Result Value Ref Range Status   Specimen Description BLOOD LEFT HAND  Final   Special Requests IN PEDIATRIC BOTTLE Blood Culture adequate volume  Final   Culture  Setup Time   Final    GRAM POSITIVE COCCI IN CLUSTERS AEROBIC BOTTLE ONLY CRITICAL RESULT CALLED TO, READ BACK BY AND VERIFIED WITH: L. Huntley Dec.D. 15:30 02/19/17 (wilsonm)    Culture (A)  Final    STAPHYLOCOCCUS AUREUS SUSCEPTIBILITIES TO FOLLOW  Report Status PENDING  Incomplete  Blood Culture ID Panel (Reflexed)     Status: Abnormal   Collection Time: 02/18/17  6:21 PM  Result Value Ref Range Status   Enterococcus species NOT DETECTED NOT DETECTED Final   Listeria monocytogenes NOT DETECTED NOT DETECTED Final   Staphylococcus species DETECTED (A) NOT DETECTED Final    Comment: CRITICAL RESULT CALLED TO, READ BACK BY AND VERIFIED WITH: L. Huntley Dec.D. 15:30 02/19/17 (wilsonm)    Staphylococcus aureus DETECTED (A) NOT DETECTED Final    Comment: Methicillin (oxacillin)-resistant Staphylococcus aureus (MRSA). MRSA is predictably resistant to beta-lactam antibiotics (except ceftaroline). Preferred therapy is vancomycin unless clinically contraindicated. Patient requires contact precautions if  hospitalized. CRITICAL RESULT CALLED TO, READ BACK BY AND VERIFIED WITH: L. Huntley Dec.D. 15:30 02/19/17 (wilsonm)    Methicillin resistance DETECTED (A) NOT DETECTED Final    Comment: CRITICAL RESULT CALLED TO, READ BACK BY AND VERIFIED WITH: L. Huntley Dec.D. 15:30 02/19/17 (wilsonm)    Streptococcus species NOT DETECTED NOT DETECTED  Final   Streptococcus agalactiae NOT DETECTED NOT DETECTED Final   Streptococcus pneumoniae NOT DETECTED NOT DETECTED Final   Streptococcus pyogenes NOT DETECTED NOT DETECTED Final   Acinetobacter baumannii NOT DETECTED NOT DETECTED Final   Enterobacteriaceae species NOT DETECTED NOT DETECTED Final   Enterobacter cloacae complex NOT DETECTED NOT DETECTED Final   Escherichia coli NOT DETECTED NOT DETECTED Final   Klebsiella oxytoca NOT DETECTED NOT DETECTED Final   Klebsiella pneumoniae NOT DETECTED NOT DETECTED Final   Proteus species NOT DETECTED NOT DETECTED Final   Serratia marcescens NOT DETECTED NOT DETECTED Final   Haemophilus influenzae NOT DETECTED NOT DETECTED Final   Neisseria meningitidis NOT DETECTED NOT DETECTED Final   Pseudomonas aeruginosa NOT DETECTED NOT DETECTED Final   Candida albicans NOT DETECTED NOT DETECTED Final   Candida glabrata NOT DETECTED NOT DETECTED Final   Candida krusei NOT DETECTED NOT DETECTED Final   Candida parapsilosis NOT DETECTED NOT DETECTED Final   Candida tropicalis NOT DETECTED NOT DETECTED Final  Culture, blood (routine x 2)     Status: Abnormal (Preliminary result)   Collection Time: 02/18/17  9:34 PM  Result Value Ref Range Status   Specimen Description BLOOD LEFT ARM  Final   Special Requests   Final    BOTTLES DRAWN AEROBIC AND ANAEROBIC Blood Culture adequate volume   Culture  Setup Time   Final    GRAM POSITIVE COCCI IN BOTH AEROBIC AND ANAEROBIC BOTTLES CRITICAL RESULT CALLED TO, READ BACK BY AND VERIFIED WITH: L. Huntley Dec.D. 15:30 02/19/17 (wilsonm)    Culture (A)  Final    STAPHYLOCOCCUS AUREUS SUSCEPTIBILITIES TO FOLLOW    Report Status PENDING  Incomplete  MRSA PCR Screening     Status: None   Collection Time: 02/18/17 11:22 PM  Result Value Ref Range Status   MRSA by PCR NEGATIVE NEGATIVE Final    Comment:        The GeneXpert MRSA Assay (FDA approved for NASAL specimens only), is one component of a comprehensive  MRSA colonization surveillance program. It is not intended to diagnose MRSA infection nor to guide or monitor treatment for MRSA infections.   MRSA PCR Screening     Status: Abnormal   Collection Time: 02/18/17 11:22 PM  Result Value Ref Range Status   MRSA by PCR POSITIVE (A) NEGATIVE Final    Comment:        The GeneXpert MRSA Assay (FDA approved for NASAL specimens only), is  one component of a comprehensive MRSA colonization surveillance program. It is not intended to diagnose MRSA infection nor to guide or monitor treatment for MRSA infections. RESULT CALLED TO, READ BACK BY AND VERIFIED WITH: DULLA,R RN [phone] 02/19/17 MITCHELL,L          Radiology Studies: Mr Lumbar Spine Wo Contrast  Result Date: 02/19/2017 CLINICAL DATA:  Right leg pain. Post transmetatarsal amputation right foot 01/01/2017 with wound dehiscence. Leukocytosis. ESRD. EXAM: MRI LUMBAR SPINE WITHOUT CONTRAST TECHNIQUE: Multiplanar, multisequence MR imaging of the lumbar spine was performed. No intravenous contrast was administered. COMPARISON:  Lumbar MRI 04/25/2016, 12/11/2010 FINDINGS: Image quality degraded by artifact from technical factors. Segmentation:  Normal Alignment:  Mild anterolisthesis L4-5 Vertebrae: Negative for fracture or mass. Advanced disc degeneration fatty endplate changes and spurring L5-S1 as noted on the prior study. Bone marrow is diffusely low signal on T2 likely related to chronic anemia from dialysis. Conus medullaris: Conus medullaris not well seen. There is some artifact over the spinal canal in this area. The conus appears to terminate at L1-2. Paraspinal and other soft tissues: Mild diffuse edema in the paraspinous muscles bilaterally. No psoas abscess. Disc levels: L1-2:  Negative L2-3: Small subdural 8 fusion anterior and posterior to the thecal sac. Mild disc and facet degeneration with mild spinal stenosis L3-4: Mild disc bulging and facet degeneration with moderate spinal  stenosis. L4-5: Abnormal signal in the ventral and posterior epidural space with dirty epidural fat and poorly defined small posterior fluid collection. This extends from approximately mid L4 through mid L5. There was a similar process in the posterior epidural space on the prior study. This could represent a chronic infection. The right L4-5 facet joint has an infusion and is arthritic and could be a source of infection. L5-S1: Advanced disc degeneration and spondylosis causing foraminal stenosis bilaterally. Posterior epidural process on the right at S1 is new since the prior study and could represent infection IMPRESSION: Abnormal epidural fat in the lumbar spine. Dirty fat with areas of ill-defined fluid or edema are present in the epidural space which could represent a chronic epidural infection. Possible fungal or atypical infection in this patient on dialysis. Abnormal posterior epidural process was present on MRI of 04/25/2016 with some progression since that time and development of a new area in the posterior epidural space on the right at L1. Correlate with blood cultures. The source could be the right L4-5 facet joint. Electronically Signed   By: Franchot Gallo M.D.   On: 02/19/2017 15:36        Scheduled Meds: . aspirin EC  325 mg Oral Daily  . atorvastatin  40 mg Oral q1800  . carvedilol  6.25 mg Oral 2 times per day on Sun Tue Thu Sat  . carvedilol  6.25 mg Oral Once per day on Mon Wed Fri  . Chlorhexidine Gluconate Cloth  6 each Topical Q0600  . cinacalcet  30 mg Oral Q breakfast  . clopidogrel  75 mg Oral Daily  . darbepoetin (ARANESP) injection - DIALYSIS  60 mcg Intravenous Q Fri-HD  . doxercalciferol  1.5 mcg Intravenous Q M,W,F-HD  . feeding supplement (NEPRO CARB STEADY)  237 mL Oral Q1500  . feeding supplement (PRO-STAT SUGAR FREE 64)  30 mL Oral Daily  . heparin  5,000 Units Subcutaneous Q8H  . insulin aspart  0-5 Units Subcutaneous QHS  . insulin aspart  0-9 Units  Subcutaneous TID WC  . insulin glargine  15 Units Subcutaneous QHS  .  multivitamin  1 tablet Oral Q M,W,F-2000  . mupirocin ointment  1 application Nasal BID  . pantoprazole  40 mg Oral Daily  . sevelamer carbonate  2,400 mg Oral TID WC  . sodium chloride flush  3 mL Intravenous Q12H   Continuous Infusions: . sodium chloride    . vancomycin 1,000 mg (02/19/17 1717)     LOS: 2 days    Time spent: [place], MD [facility]   If 7PM-7AM, please contact night-coverage [email or web address] Password [facility] 02/20/2017, 10:27 AM

## 2017-02-20 NOTE — Progress Notes (Signed)
Complete Echocardiogram

## 2017-02-20 NOTE — Progress Notes (Signed)
While changing dressing on pt's right foot (non-adherent, ABD, and kerlex), was alerted by pt's visitor of a "sore on his left toe". Removed pt's sock and observed what appeared to be a small diabetic foot ulcer on the outside of pt's left great toe. Pt denied any pain or discomfort to the area. Covered ulcer with small pink foam. Will continue to monitor

## 2017-02-20 NOTE — Progress Notes (Signed)
   Daily Progress Note   Assessment/Planning:   s/p R TMA   RLE arterial duplex demonstrate occlusion R AT and PT  Doubt R TMA will heal  Pt will need to consider R BKA vs AKA  Dr. Trula Slade will be back on Monday    Subjective     C/o mild pain R foot   Objective   Vitals:   02/19/17 1800 02/19/17 1804 02/19/17 1851 02/20/17 0542  BP: (!) 150/50 (!) 153/56 (!) 134/58 (!) 141/62  Pulse: 87 97 96 92  Resp: (!) 26     Temp: 98 F (36.7 C)  99.8 F (37.7 C) 98.5 F (36.9 C)  TempSrc:   Oral Oral  SpO2:   97% 97%  Weight: 286 lb 2.5 oz (129.8 kg)   277 lb 9 oz (125.9 kg)  Height:         Intake/Output Summary (Last 24 hours) at 02/20/17 0955 Last data filed at 02/20/17 0542  Gross per 24 hour  Intake              200 ml  Output             1453 ml  Net            -1253 ml    VASC R TMA with dehisced incision, frankly ischemic tissue, +drainage from TMA    Laboratory   CBC CBC Latest Ref Rng & Units 02/19/2017 02/18/2017 02/18/2017  WBC 4.0 - 10.5 K/uL 21.3(H) 19.4(H) 20.8(H)  Hemoglobin 13.0 - 17.0 g/dL 7.7(L) 7.8(L) 8.0(L)  Hematocrit 39.0 - 52.0 % 25.9(L) 25.6(L) 26.6(L)  Platelets 150 - 400 K/uL 396 368 353    BMET    Component Value Date/Time   NA 134 (L) 02/19/2017 0503   K 3.8 02/19/2017 0503   CL 94 (L) 02/19/2017 0503   CO2 27 02/19/2017 0503   GLUCOSE 152 (H) 02/19/2017 0503   BUN 27 (H) 02/19/2017 0503   CREATININE 6.66 (H) 02/19/2017 0503   CALCIUM 8.7 (L) 02/19/2017 0503   GFRNONAA 8 (L) 02/19/2017 0503   GFRAA 9 (L) 02/19/2017 0503     [name], MD, FACS [facility] Office: [phone] Pager: [phone]  02/20/2017, 9:55 AM

## 2017-02-20 NOTE — Progress Notes (Signed)
VASCULAR LAB PRELIMINARY  PRELIMINARY  PRELIMINARY  PRELIMINARY  Right lower extremity venous duplex completed.    Preliminary report:  No flow noted in the right posterior tibial and anterior tibial arteries.  Significant plaque noted throughout the right CFA and SFA with some areas of near occlusion noted mid and distal SFA.   Jacob [name], RVT 02/20/2017, 8:32 AM

## 2017-02-20 NOTE — Progress Notes (Signed)
[facility] for Infectious Disease   Reason for visit: Follow up on MRSA bacteremia  Interval History: getting a TTE now, no fever, repeat blood cultures sent today; no associated n/v/d.  Complains of pain   Physical Exam: Constitutional:  Vitals:   02/19/17 1851 02/20/17 0542  BP: (!) 134/58 (!) 141/62  Pulse: 96 92  Resp:    Temp: 99.8 F (37.7 C) 98.5 F (36.9 C)   patient appears in mild distress with pain due to echo Eyes: anicteric Respiratory: Normal respiratory effort; CTA B Cardiovascular: RRR GI: soft, nt, nd  Review of Systems: Constitutional: negative for fevers and chills Respiratory: negative for cough or sputum Gastrointestinal: negative for nausea and diarrhea  Lab Results  Component Value Date   WBC 21.3 (H) 02/19/2017   HGB 7.7 (L) 02/19/2017   HCT 25.9 (L) 02/19/2017   MCV 84.4 02/19/2017   PLT 396 02/19/2017    Lab Results  Component Value Date   CREATININE 6.66 (H) 02/19/2017   BUN 27 (H) 02/19/2017   NA 134 (L) 02/19/2017   K 3.8 02/19/2017   CL 94 (L) 02/19/2017   CO2 27 02/19/2017    Lab Results  Component Value Date   ALT 9 (L) 02/18/2017   AST 19 02/18/2017   ALKPHOS 184 (H) 02/18/2017     Microbiology: Recent Results (from the past 240 hour(s))  Culture, blood (routine x 2)     Status: Abnormal (Preliminary result)   Collection Time: 02/18/17  6:21 PM  Result Value Ref Range Status   Specimen Description BLOOD LEFT HAND  Final   Special Requests IN PEDIATRIC BOTTLE Blood Culture adequate volume  Final   Culture  Setup Time   Final    GRAM POSITIVE COCCI IN CLUSTERS AEROBIC BOTTLE ONLY CRITICAL RESULT CALLED TO, READ BACK BY AND VERIFIED WITH: L. Huntley Dec.D. 15:30 02/19/17 (wilsonm)    Culture (A)  Final    STAPHYLOCOCCUS AUREUS SUSCEPTIBILITIES TO FOLLOW    Report Status PENDING  Incomplete  Blood Culture ID Panel (Reflexed)     Status: Abnormal   Collection Time: 02/18/17  6:21 PM  Result Value Ref Range  Status   Enterococcus species NOT DETECTED NOT DETECTED Final   Listeria monocytogenes NOT DETECTED NOT DETECTED Final   Staphylococcus species DETECTED (A) NOT DETECTED Final    Comment: CRITICAL RESULT CALLED TO, READ BACK BY AND VERIFIED WITH: L. Huntley Dec.D. 15:30 02/19/17 (wilsonm)    Staphylococcus aureus DETECTED (A) NOT DETECTED Final    Comment: Methicillin (oxacillin)-resistant Staphylococcus aureus (MRSA). MRSA is predictably resistant to beta-lactam antibiotics (except ceftaroline). Preferred therapy is vancomycin unless clinically contraindicated. Patient requires contact precautions if  hospitalized. CRITICAL RESULT CALLED TO, READ BACK BY AND VERIFIED WITH: L. Huntley Dec.D. 15:30 02/19/17 (wilsonm)    Methicillin resistance DETECTED (A) NOT DETECTED Final    Comment: CRITICAL RESULT CALLED TO, READ BACK BY AND VERIFIED WITH: L. Huntley Dec.D. 15:30 02/19/17 (wilsonm)    Streptococcus species NOT DETECTED NOT DETECTED Final   Streptococcus agalactiae NOT DETECTED NOT DETECTED Final   Streptococcus pneumoniae NOT DETECTED NOT DETECTED Final   Streptococcus pyogenes NOT DETECTED NOT DETECTED Final   Acinetobacter baumannii NOT DETECTED NOT DETECTED Final   Enterobacteriaceae species NOT DETECTED NOT DETECTED Final   Enterobacter cloacae complex NOT DETECTED NOT DETECTED Final   Escherichia coli NOT DETECTED NOT DETECTED Final   Klebsiella oxytoca NOT DETECTED NOT DETECTED Final   Klebsiella pneumoniae NOT DETECTED NOT  DETECTED Final   Proteus species NOT DETECTED NOT DETECTED Final   Serratia marcescens NOT DETECTED NOT DETECTED Final   Haemophilus influenzae NOT DETECTED NOT DETECTED Final   Neisseria meningitidis NOT DETECTED NOT DETECTED Final   Pseudomonas aeruginosa NOT DETECTED NOT DETECTED Final   Candida albicans NOT DETECTED NOT DETECTED Final   Candida glabrata NOT DETECTED NOT DETECTED Final   Candida krusei NOT DETECTED NOT DETECTED Final   Candida  parapsilosis NOT DETECTED NOT DETECTED Final   Candida tropicalis NOT DETECTED NOT DETECTED Final  Culture, blood (routine x 2)     Status: Abnormal (Preliminary result)   Collection Time: 02/18/17  9:34 PM  Result Value Ref Range Status   Specimen Description BLOOD LEFT ARM  Final   Special Requests   Final    BOTTLES DRAWN AEROBIC AND ANAEROBIC Blood Culture adequate volume   Culture  Setup Time   Final    GRAM POSITIVE COCCI IN BOTH AEROBIC AND ANAEROBIC BOTTLES CRITICAL RESULT CALLED TO, READ BACK BY AND VERIFIED WITH: L. Huntley Dec.D. 15:30 02/19/17 (wilsonm)    Culture (A)  Final    STAPHYLOCOCCUS AUREUS SUSCEPTIBILITIES TO FOLLOW    Report Status PENDING  Incomplete  MRSA PCR Screening     Status: None   Collection Time: 02/18/17 11:22 PM  Result Value Ref Range Status   MRSA by PCR NEGATIVE NEGATIVE Final    Comment:        The GeneXpert MRSA Assay (FDA approved for NASAL specimens only), is one component of a comprehensive MRSA colonization surveillance program. It is not intended to diagnose MRSA infection nor to guide or monitor treatment for MRSA infections.   MRSA PCR Screening     Status: Abnormal   Collection Time: 02/18/17 11:22 PM  Result Value Ref Range Status   MRSA by PCR POSITIVE (A) NEGATIVE Final    Comment:        The GeneXpert MRSA Assay (FDA approved for NASAL specimens only), is one component of a comprehensive MRSA colonization surveillance program. It is not intended to diagnose MRSA infection nor to guide or monitor treatment for MRSA infections. RESULT CALLED TO, READ BACK BY AND VERIFIED WITH: DULLA,R RN 2353 02/19/17 MITCHELL,L     Impression/Plan:  1. MRSA bacteremia - repeat cultures sent, TTE being done.  Likely source foot with non-healing TMA.   With an AVF, I would treat for 4 weeks from negative culture.  I don't think a TEE is absolutely indicated unless there are concerns on the TTE  2.  PAD - on aspirin, Plavix  3.   TMA - non -healing, I suspect source for #1.  Vascular surgery to consider a BKA/AKA.    I will follow up on Monday

## 2017-02-20 NOTE — Clinical Social Work Note (Signed)
CSW acknowledges consult "Access meds for discharge." Please consult RNCM.  CSW signing off. Consult again if any social work needs arise.  Dayton Scrape, [place] [phone] c

## 2017-02-21 LAB — GLUCOSE, CAPILLARY
GLUCOSE-CAPILLARY: 122 mg/dL — AB (ref 65–99)
GLUCOSE-CAPILLARY: 144 mg/dL — AB (ref 65–99)
Glucose-Capillary: 149 mg/dL — ABNORMAL HIGH (ref 65–99)
Glucose-Capillary: 155 mg/dL — ABNORMAL HIGH (ref 65–99)

## 2017-02-21 LAB — CULTURE, BLOOD (ROUTINE X 2)
SPECIAL REQUESTS: ADEQUATE
Special Requests: ADEQUATE

## 2017-02-21 NOTE — Progress Notes (Signed)
Admit: 02/18/2017 LOS: 3  38M ESRD MWF RUE AVF  Jacob [name] with R TMA site infection and poor healing.  Subjective:  No new events 7/19 B Cx x2 +MRSA; TTE neg for IE yesterday Likely to req further amputation for nonhealing TMA  07/21 0701 - 07/22 0700 In: 600 [P.O.:600] Out: -   Filed Weights   02/19/17 1404 02/19/17 1800 02/20/17 0542  Weight: 131.2 kg (289 lb 3.9 oz) 129.8 kg (286 lb 2.5 oz) 125.9 kg (277 lb 9 oz)    Scheduled Meds: . aspirin EC  325 mg Oral Daily  . atorvastatin  40 mg Oral q1800  . carvedilol  6.25 mg Oral 2 times per day on Sun Tue Thu Sat  . carvedilol  6.25 mg Oral Once per day on Mon Wed Fri  . Chlorhexidine Gluconate Cloth  6 each Topical Q0600  . cinacalcet  30 mg Oral Q breakfast  . clopidogrel  75 mg Oral Daily  . darbepoetin (ARANESP) injection - DIALYSIS  60 mcg Intravenous Q Fri-HD  . doxercalciferol  1.5 mcg Intravenous Q M,W,F-HD  . feeding supplement (NEPRO CARB STEADY)  237 mL Oral Q1500  . feeding supplement (PRO-STAT SUGAR FREE 64)  30 mL Oral Daily  . finasteride  5 mg Oral Daily  . heparin  5,000 Units Subcutaneous Q8H  . insulin aspart  0-5 Units Subcutaneous QHS  . insulin aspart  0-9 Units Subcutaneous TID WC  . insulin glargine  15 Units Subcutaneous QHS  . multivitamin  1 tablet Oral Q M,W,F-2000  . mupirocin ointment  1 application Nasal BID  . pantoprazole  40 mg Oral Daily  . sevelamer carbonate  2,400 mg Oral TID WC  . sodium chloride flush  3 mL Intravenous Q12H   Continuous Infusions: . sodium chloride    . vancomycin 1,000 mg (02/19/17 1717)   PRN Meds:.sodium chloride, acetaminophen, albuterol, bisacodyl, dextromethorphan, loratadine, ondansetron **OR** ondansetron (ZOFRAN) IV, oxyCODONE-acetaminophen, sevelamer carbonate, sodium chloride flush  Current Labs: reviewed    Physical Exam:  Blood pressure (!) 145/64, pulse 72, temperature 98.2 F (36.8 C), temperature source Oral, resp. rate 18, height 6\' 2"  (1.88  m), weight 125.9 kg (277 lb 9 oz), SpO2 93 %. GEN: NAD, well appearing ENT: NCAT EYES: EOMI CV: RRR, no rub, nl s1s2 PULM: CTAB, nl wob ABD: s/t/nd' +BS SKIN: RLE bandaged, not examined, no rashes elsewhwere EXT:No LEE AVF RUE : +B/T  Outpt HD Orders Unit: [facility] Days: MWF Time: 4.5h Dialyzer: Revaclr 400 EDW: 126kg K/Ca: 2/2.5 Access: RUE AVF Needle Size: 14g BFR/DFR: 450/600 VDRA: hectorol 1.46mcg qTx EPO: 7800 IVB qTx IV Fe: none Heparin: 2000 IVB and 1600 qhr continuous  A 1. ESRD MWF RUE AVF 2. S/p R TMA 01/01/17 with poor healing and drainage, MRSA infection, on Vanc 3. MRSA Bacteremia, neg TTE 02/20/17 4. ?chronic epidural infection 5. Anemia; worsened with acute health issues; TSAT 9% and ferritin 909 6. Leukocytosis, neutrophilia 2/2 #2 7. DM2 8. PAD  Plan 1. Cont HD on MWF schedule: 3K 2.5 Ca 3L UF, standing weight if able,  AVF 2. ABX per [facility] 3. VVS to eval TMA, further therapy likely to require more proximal amputation 4. I would not give IV Fe at this time with active infection, cont ESA, rec yesterday    Pearson Grippe MD 02/21/2017, 9:18 AM   Recent Labs Lab 02/18/17 1821 02/18/17 2325 02/19/17 0503  NA 131* 130* 134*  K 3.8 3.6 3.8  CL 94*  92* 94*  CO2 25 26 27   GLUCOSE 175* 174* 152*  BUN 24* 25* 27*  CREATININE 6.23* 6.45* 6.66*  CALCIUM 8.5* 8.6* 8.7*  PHOS  --  2.6  --     Recent Labs Lab 02/18/17 1821 02/18/17 2325 02/19/17 0503  WBC 20.8* 19.4* 21.3*  NEUTROABS 17.6*  --  18.1*  HGB 8.0* 7.8* 7.7*  HCT 26.6* 25.6* 25.9*  MCV 84.7 82.8 84.4  PLT 353 368 396

## 2017-02-21 NOTE — Consult Note (Signed)
[place] Nurse wound consult note Reason for Consult: Left great toe full thickness tissue loss at distal tip. (Right foot has been treated and will be followed by vascular surgery.)  Wound type:neuropathic, possible arterial insufficiency Pressure Injury POA: N/A Measurement:1cm x 0.6cm x 0.1cm Wound TMY:TRZN pink, dry Drainage (amount, consistency, odor) None Periwound:Intact, dry Dressing procedure/placement/frequency: I will provide Nursing with guidance via the Orders for conservative care to the left great toe using NS continuously moist gauze dressings (changed twice daily).  Additionally, I have today provided an Order for pressure redistribution heel boots as patient is at increased risk for pressure injury. [place] nursing team will not follow, but will remain available to this patient, the nursing and medical teams.  Please re-consult if needed. Thanks, Maudie Flakes, MSN, RN, [place], Arther Abbott  Pager# [phone]

## 2017-02-21 NOTE — Progress Notes (Signed)
PROGRESS NOTE    Jacob [name]  RPR:[id number] DOB: 08-26-1950 DOA: 02/18/2017 PCP: Sharilyn Sites, MD  Outpatient Specialists:   Dr Donzetta Matters  Brief Narrative:   18 resident of [facility] Diabetes mellitus insulin-dependent with neuropathy Anemia of renal disease Hypertension Prior CVA Prior nonischemic cardiomyopathy with improvement  Grade 1 diastolic dysfunction per echo 12/2011  Cataract surgery 2015 ESRD MWF per extremity AV fistula-  recent revascularization right lower extremity posterior tibial/anterior tibial arteries-drug-coated angioma right up pretty artery  Transmetatarsal amputation 6.1.18-seen at vascular office 7/17 with poor healing  Had been getting wound follow-up at [facility] with silver alginate--outpatient management has failed  Admitted through emergency room 02/18/2017 with wound dehiscence marked leukocytosis afebrile Vascular surgery consulted considering surgery Nephrology has been consult  Amputation being considered after d/w dr. Trula Slade 7/23 am  Assessment & Plan:   Principal Problem:   Wound infection Active Problems:   End-stage renal disease on hemodialysis [facility])   Essential hypertension, benign   Insulin-requiring or dependent type II diabetes mellitus ([place])   Normocytic anemia   Hyponatremia   Right foot infection   Wound dehiscence-recent transmetatarsal amputation Diabetic ulcer L great toe as well now Sepsis-source is forefoot infection MRSA bacteremia Abnormal MRI 7/20 discitis?-I do not think that this is actual etiology  Continue vancomycin  Vascular surgery indicates need amputation--await formal discussion 7/23  WBC remains in the 20's and it is doubtful patient can improve without surgical management  Toxic metabolic encephalopathy  Sepsis  Meds improved with Rx sepsis Pain control with Percocet every 4 when necessary, Dilaudid 1 mg breakthrough--do not escalate pain meds given somnolence most of the day  7/21  ESRD MWF [facility] 2/2 diabetic nephrosclerosis, hypertensive glomerulopathy Secondary hyperparathyroidism Dialyzing via right upper extremity AV fistula anemia of renal disease  Nephrology consulted  Blood pressure low earlier in admission-has resolved--was 2/2 to sepsis + infection  Continue Sensipar 30 every morning, Renvela 2.4 g 3 times a day meal   iron studies-low Fe and tsat--usual hemoglobin ~ 9, now in 7 range-defer to nephro  Prior CVA  Unclear when this was after review of notes  Continue Plavix, aspirin  Prior nonischemic cardiomyopathy Last echocardiogram EF 55-60% 03/07/2015 HTN-mild hypotension at times  Continue Coreg 6.25 nondialysis days-STTS--increase if trends stay above 140  No lisinopril 20 mg given hypotension  Seems stable otherwise  ? Seizure? Bipolar-stable and improved  Is on Depakote 500 -bid not taken for 2 months  Indication = alcoholic seizures- last had a drink 2 months ago  D/c depakote for now   Diabetes mellitus2 Diabetic ulcer L great toe--worsening  Home meds = 20 units at bedtime Lantus  Continue sliding scale, continue lower dose of Lantus 15 at bedtime  Sugars are 122-149  Get WOC nurse input for L greaat toe--might need surgery input?   Lovenox Long discussion with family who are understandably upset about loss of limb possibility--urged them to discuss with VVS when they round in am tomorrow ? Likely BKA dispo unclear   Consultants:   Donzetta Matters, VVS  [facility]   Procedures:    none  Antimicrobials:   As above     Subjective:  More awake Hard to understand States 8/10 pain, yt seated comfortably in the bed Eating less than 50% meal No diarrhea-stool yesterday No cp Cannot get comfy in bed  Objective: Vitals:   02/20/17 1500 02/20/17 2152 02/20/17 2200 02/21/17 0540  BP: (!) 146/65 (!) 89/42 (!) 117/46 (!) 145/64  Pulse: 85 66  72  Resp:  16  18  Temp: 98.8 F (37.1 C) 99.4 F (37.4 C)  98.2 F  (36.8 C)  TempSrc: Oral Axillary  Oral  SpO2: 95% 94%  93%  Weight:      Height:        Intake/Output Summary (Last 24 hours) at 02/21/17 1216 Last data filed at 02/21/17 0700  Gross per 24 hour  Intake              600 ml  Output                0 ml  Net              600 ml   Filed Weights   02/19/17 1404 02/19/17 1800 02/20/17 0542  Weight: 131.2 kg (289 lb 3.9 oz) 129.8 kg (286 lb 2.5 oz) 125.9 kg (277 lb 9 oz)    Examination:  Awake alert hard to understand speech hoh Thick neck mallampati 3 No jvd s1 s 2 hsm abd obese nt nd Dressing around R forefoot not disturbed  L great toe has ulcer 2 x 3 cm   Data Reviewed: I have personally reviewed following labs and imaging studies  CBC:  Recent Labs Lab 02/18/17 1821 02/18/17 2325 02/19/17 0503  WBC 20.8* 19.4* 21.3*  NEUTROABS 17.6*  --  18.1*  HGB 8.0* 7.8* 7.7*  HCT 26.6* 25.6* 25.9*  MCV 84.7 82.8 84.4  PLT 353 368 662   Basic Metabolic Panel:  Recent Labs Lab 02/18/17 1821 02/18/17 2325 02/19/17 0503  NA 131* 130* 134*  K 3.8 3.6 3.8  CL 94* 92* 94*  CO2 25 26 27   GLUCOSE 175* 174* 152*  BUN 24* 25* 27*  CREATININE 6.23* 6.45* 6.66*  CALCIUM 8.5* 8.6* 8.7*  PHOS  --  2.6  --    GFR: Estimated Creatinine Clearance: 15.2 mL/min (A) (by C-G formula based on SCr of 6.66 mg/dL (H)). Liver Function Tests:  Recent Labs Lab 02/18/17 1821 02/18/17 2325  AST 19  --   ALT 9*  --   ALKPHOS 184*  --   BILITOT 0.4  --   PROT 8.1  --   ALBUMIN 2.2* 2.2*   No results for input(s): LIPASE, AMYLASE in the last 168 hours. No results for input(s): AMMONIA in the last 168 hours. Coagulation Profile: No results for input(s): INR, PROTIME in the last 168 hours. Cardiac Enzymes: No results for input(s): CKTOTAL, CKMB, CKMBINDEX, TROPONINI in the last 168 hours. BNP (last 3 results) No results for input(s): PROBNP in the last 8760 hours. HbA1C: No results for input(s): HGBA1C in the last 72  hours. CBG:  Recent Labs Lab 02/20/17 1140 02/20/17 1638 02/20/17 2147 02/21/17 0650 02/21/17 1137  GLUCAP 144* 151* 129* 122* 149*   Lipid Profile: No results for input(s): CHOL, HDL, LDLCALC, TRIG, CHOLHDL, LDLDIRECT in the last 72 hours. Thyroid Function Tests: No results for input(s): TSH, T4TOTAL, FREET4, T3FREE, THYROIDAB in the last 72 hours. Anemia Panel:  Recent Labs  02/20/17 0520  FERRITIN 909*  TIBC 106*  IRON 10*   Urine analysis: No results found for: COLORURINE, APPEARANCEUR, LABSPEC, PHURINE, GLUCOSEU, HGBUR, BILIRUBINUR, KETONESUR, PROTEINUR, UROBILINOGEN, NITRITE, LEUKOCYTESUR   Radiology Studies: Mr Lumbar Spine Wo Contrast  Result Date: 02/19/2017 CLINICAL DATA:  Right leg pain. Post transmetatarsal amputation right foot 01/01/2017 with wound dehiscence. Leukocytosis. ESRD. EXAM: MRI LUMBAR SPINE WITHOUT CONTRAST TECHNIQUE: Multiplanar, multisequence MR imaging of the  lumbar spine was performed. No intravenous contrast was administered. COMPARISON:  Lumbar MRI 04/25/2016, 12/11/2010 FINDINGS: Image quality degraded by artifact from technical factors. Segmentation:  Normal Alignment:  Mild anterolisthesis L4-5 Vertebrae: Negative for fracture or mass. Advanced disc degeneration fatty endplate changes and spurring L5-S1 as noted on the prior study. Bone marrow is diffusely low signal on T2 likely related to chronic anemia from dialysis. Conus medullaris: Conus medullaris not well seen. There is some artifact over the spinal canal in this area. The conus appears to terminate at L1-2. Paraspinal and other soft tissues: Mild diffuse edema in the paraspinous muscles bilaterally. No psoas abscess. Disc levels: L1-2:  Negative L2-3: Small subdural 8 fusion anterior and posterior to the thecal sac. Mild disc and facet degeneration with mild spinal stenosis L3-4: Mild disc bulging and facet degeneration with moderate spinal stenosis. L4-5: Abnormal signal in the ventral and  posterior epidural space with dirty epidural fat and poorly defined small posterior fluid collection. This extends from approximately mid L4 through mid L5. There was a similar process in the posterior epidural space on the prior study. This could represent a chronic infection. The right L4-5 facet joint has an infusion and is arthritic and could be a source of infection. L5-S1: Advanced disc degeneration and spondylosis causing foraminal stenosis bilaterally. Posterior epidural process on the right at S1 is new since the prior study and could represent infection IMPRESSION: Abnormal epidural fat in the lumbar spine. Dirty fat with areas of ill-defined fluid or edema are present in the epidural space which could represent a chronic epidural infection. Possible fungal or atypical infection in this patient on dialysis. Abnormal posterior epidural process was present on MRI of 04/25/2016 with some progression since that time and development of a new area in the posterior epidural space on the right at L1. Correlate with blood cultures. The source could be the right L4-5 facet joint. Electronically Signed   By: Franchot Gallo M.D.   On: 02/19/2017 15:36     Scheduled Meds: . aspirin EC  325 mg Oral Daily  . atorvastatin  40 mg Oral q1800  . carvedilol  6.25 mg Oral 2 times per day on Sun Tue Thu Sat  . carvedilol  6.25 mg Oral Once per day on Mon Wed Fri  . Chlorhexidine Gluconate Cloth  6 each Topical Q0600  . cinacalcet  30 mg Oral Q breakfast  . clopidogrel  75 mg Oral Daily  . darbepoetin (ARANESP) injection - DIALYSIS  60 mcg Intravenous Q Fri-HD  . doxercalciferol  1.5 mcg Intravenous Q M,W,F-HD  . feeding supplement (NEPRO CARB STEADY)  237 mL Oral Q1500  . feeding supplement (PRO-STAT SUGAR FREE 64)  30 mL Oral Daily  . finasteride  5 mg Oral Daily  . heparin  5,000 Units Subcutaneous Q8H  . insulin aspart  0-5 Units Subcutaneous QHS  . insulin aspart  0-9 Units Subcutaneous TID WC  . insulin  glargine  15 Units Subcutaneous QHS  . multivitamin  1 tablet Oral Q M,W,F-2000  . mupirocin ointment  1 application Nasal BID  . pantoprazole  40 mg Oral Daily  . sevelamer carbonate  2,400 mg Oral TID WC  . sodium chloride flush  3 mL Intravenous Q12H   Continuous Infusions: . sodium chloride    . vancomycin Stopped (02/21/17 0951)     LOS: 3 days    Time spent: [place], MD [facility] [phone]   If  7PM-7AM, please contact night-coverage [email or web address] Password [facility] 02/21/2017, 12:16 PM

## 2017-02-22 ENCOUNTER — Ambulatory Visit: Payer: Self-pay | Admitting: *Deleted

## 2017-02-22 ENCOUNTER — Other Ambulatory Visit: Payer: Self-pay | Admitting: Licensed Clinical Social Worker

## 2017-02-22 ENCOUNTER — Ambulatory Visit: Payer: Medicare Other | Admitting: Surgery

## 2017-02-22 LAB — COMPREHENSIVE METABOLIC PANEL
ALBUMIN: 1.9 g/dL — AB (ref 3.5–5.0)
ALK PHOS: 193 U/L — AB (ref 38–126)
ALT: 9 U/L — AB (ref 17–63)
ANION GAP: 12 (ref 5–15)
AST: 26 U/L (ref 15–41)
BUN: 47 mg/dL — ABNORMAL HIGH (ref 6–20)
CALCIUM: 8 mg/dL — AB (ref 8.9–10.3)
CO2: 26 mmol/L (ref 22–32)
CREATININE: 8.57 mg/dL — AB (ref 0.61–1.24)
Chloride: 92 mmol/L — ABNORMAL LOW (ref 101–111)
GFR calc Af Amer: 7 mL/min — ABNORMAL LOW (ref >60)
GFR calc non Af Amer: 6 mL/min — ABNORMAL LOW (ref >60)
GLUCOSE: 71 mg/dL (ref 65–99)
Potassium: 3.6 mmol/L (ref 3.5–5.1)
SODIUM: 130 mmol/L — AB (ref 135–145)
Total Bilirubin: 0.9 mg/dL (ref 0.3–1.2)
Total Protein: 7.9 g/dL (ref 6.5–8.1)

## 2017-02-22 LAB — CBC
HCT: 22.7 % — ABNORMAL LOW (ref 39.0–52.0)
HEMOGLOBIN: 6.9 g/dL — AB (ref 13.0–17.0)
MCH: 24.5 pg — AB (ref 26.0–34.0)
MCHC: 30.4 g/dL (ref 30.0–36.0)
MCV: 80.5 fL (ref 78.0–100.0)
Platelets: 420 10*3/uL — ABNORMAL HIGH (ref 150–400)
RBC: 2.82 MIL/uL — ABNORMAL LOW (ref 4.22–5.81)
RDW: 17.5 % — ABNORMAL HIGH (ref 11.5–15.5)
WBC: 17.1 10*3/uL — ABNORMAL HIGH (ref 4.0–10.5)

## 2017-02-22 LAB — GLUCOSE, CAPILLARY
GLUCOSE-CAPILLARY: 59 mg/dL — AB (ref 65–99)
GLUCOSE-CAPILLARY: 126 mg/dL — AB (ref 65–99)
Glucose-Capillary: 91 mg/dL (ref 65–99)
Glucose-Capillary: 100 mg/dL — ABNORMAL HIGH (ref 65–99)

## 2017-02-22 LAB — PREPARE RBC (CROSSMATCH)

## 2017-02-22 MED ORDER — VANCOMYCIN HCL IN DEXTROSE 1-5 GM/200ML-% IV SOLN
INTRAVENOUS | Status: AC
  Filled 2017-02-22: qty 200

## 2017-02-22 MED ORDER — SODIUM CHLORIDE 0.9 % IV SOLN: Freq: Once | INTRAVENOUS | Status: DC

## 2017-02-22 MED ORDER — HEPARIN SODIUM (PORCINE) 1000 UNIT/ML DIALYSIS: 20.0000 [IU]/kg | INTRAMUSCULAR | Status: DC | PRN

## 2017-02-22 MED ORDER — DOXERCALCIFEROL 4 MCG/2ML IV SOLN
INTRAVENOUS | Status: AC
  Filled 2017-02-22: qty 2

## 2017-02-22 MED ORDER — OXYCODONE-ACETAMINOPHEN 5-325 MG PO TABS
ORAL_TABLET | ORAL | Status: AC
  Filled 2017-02-22: qty 1

## 2017-02-22 MED ORDER — HYDROMORPHONE HCL 1 MG/ML IJ SOLN
0.5000 mg | INTRAMUSCULAR | Status: DC | PRN
  Administered 2017-02-23 – 2017-02-25 (×5): 0.5 mg via INTRAVENOUS
  Filled 2017-02-22 (×2): qty 0.5
  Filled 2017-02-22: qty 1
  Filled 2017-02-22: qty 0.5

## 2017-02-22 NOTE — Procedures (Signed)
I have seen and examined this patient and agree with the plan of care  51M ESRD MWF RUE AVF [place] with R TMA site infection and poor healing.  Patient seen on dialysis  Appears to have worsening anemia    Transfuse with 2 units with  dialysis      [facility] 02/22/2017, 12:53 PM

## 2017-02-22 NOTE — Progress Notes (Signed)
Pt's Hgb 6.9. Dr. Cyndia Diver informed. Will continue to monitor.

## 2017-02-22 NOTE — Progress Notes (Signed)
  Progress Note    02/22/2017 3:17 PM * No surgery date entered *  Subjective:  Having right hip/buttock pain  Vitals:   02/22/17 1100 02/22/17 1130  BP: (!) 125/40 (!) 148/47  Pulse: 63 66  Resp: 20 18  Temp: 98.3 F (36.8 C)     Physical Exam: aaox3 Abdomen is soft Right thigh is soft, palpable femoral pulse  mulitphasic right AT at ankle  CBC    Component Value Date/Time   WBC 17.1 (H) 02/22/2017 0310   RBC 2.82 (L) 02/22/2017 0310   HGB 6.9 (LL) 02/22/2017 0310   HCT 22.7 (L) 02/22/2017 0310   PLT 420 (H) 02/22/2017 0310   MCV 80.5 02/22/2017 0310   MCH 24.5 (L) 02/22/2017 0310   MCHC 30.4 02/22/2017 0310   RDW 17.5 (H) 02/22/2017 0310   LYMPHSABS 1.3 02/19/2017 0503   MONOABS 1.6 (H) 02/19/2017 0503   EOSABS 0.3 02/19/2017 0503   BASOSABS 0.1 02/19/2017 0503    BMET    Component Value Date/Time   NA 130 (L) 02/22/2017 0310   K 3.6 02/22/2017 0310   CL 92 (L) 02/22/2017 0310   CO2 26 02/22/2017 0310   GLUCOSE 71 02/22/2017 0310   BUN 47 (H) 02/22/2017 0310   CREATININE 8.57 (H) 02/22/2017 0310   CALCIUM 8.0 (L) 02/22/2017 0310   GFRNONAA 6 (L) 02/22/2017 0310   GFRAA 7 (L) 02/22/2017 0310    INR    Component Value Date/Time   INR 1.30 12/30/2016 0226     Intake/Output Summary (Last 24 hours) at 02/22/17 1517 Last data filed at 02/22/17 1010  Gross per 24 hour  Intake             1445 ml  Output                0 ml  Net             1445 ml     Assessment:  66 y.o. male is s/p RLE endo revascularization and tma now with wound dehiscence.   Plan: Aortogram with Dr. Trula Slade tomorrow Npo at midnight  Discussed likelihood of at least debridement of right tma site with possible need for bka in near future   [place] C. Donzetta Matters, MD [facility] Office: [phone] Pager: [phone]  02/22/2017 3:17 PM

## 2017-02-22 NOTE — Progress Notes (Signed)
Pharmacy Antibiotic Note  Jacob [name] is a 66 y.o. male admitted on 02/18/2017 with cellulitis.  Pharmacy has been consulted for vancomycin dosing.  Patient is ESRD on HD-MWF. Received an appropriate loading dose and correct doses after HD sessions on 7/20 and 7/23.  Vascular following for possibility of AKA vs BKA as doubtful TMA will heal.  ID recommends 4 weeks of treatment from negative cultures.  Plan: Vancomycin 1g IV qHD-MWF Follow HD schedule/tolerance, surgery plans (and watching for extra doses ordered around OR) Plan for pre-HD vancomycin level on 7/25 - not yet ordered  Height: 6\' 2"  (188 cm) Weight: 283 lb 4.7 oz (128.5 kg) IBW/kg (Calculated) : 82.2  Temp (24hrs), Avg:98.5 F (36.9 C), Min:98 F (36.7 C), Max:99.1 F (37.3 C)   Recent Labs Lab 02/18/17 1821 02/18/17 1835 02/18/17 2325 02/19/17 0503 02/22/17 0310  WBC 20.8*  --  19.4* 21.3* 17.1*  CREATININE 6.23*  --  6.45* 6.66* 8.57*  LATICACIDVEN  --  1.90  --   --   --     Estimated Creatinine Clearance: 11.9 mL/min (A) (by C-G formula based on SCr of 8.57 mg/dL (H)).    Allergies  Allergen Reactions  . Bee Venom Anaphylaxis  . Penicillins Swelling and Other (See Comments)    SWELLING REACTION UNSPECIFIED   Has patient had a PCN reaction causing immediate rash, facial/tongue/throat swelling, SOB or lightheadedness with hypotension: No Has patient had a PCN reaction causing severe rash involving mucus membranes or skin necrosis: No Has patient had a PCN reaction that required hospitalization No Has patient had a PCN reaction occurring within the last 10 years: No If all of the above answers are "NO", then may proceed with Cephalosporin use.  . Iodine Swelling  . Sulfadiazine Other (See Comments)    1% Silver Sulfadiazine cream causes burning over a large area of skin.    Antimicrobials this admission: Vanc 7/19>> *loaded with 2g and given appropriate dose after HD sessions on 7/20 and  7/23 Ceftriaxone 7/19 >>7/20 Flagyl 7/19 >>7/20 Cipro x 1 7/19 Clinda x 1 7/19  Dose adjustments this admission: N/A  Microbiology results: 7/19 BCx: 2/2 MRSA- R to cipro 7/19 BCID MRSA 7/21 BCx: ngtd  Thank you for allowing pharmacy to be a part of this patient's care.  Dann Ventress D. Oree Hislop, PharmD, BCPS Clinical Pharmacist Pager: [phone] Clinical Phone for 02/22/2017 until 3:30pm: [phone] If after 3:30pm, please call main pharmacy at [phone] 02/22/2017 12:25 PM

## 2017-02-22 NOTE — Patient Outreach (Signed)
Assessment:  CSW spoke via phone with client. CSW verified client identity. CSW received verbal permission from client on 02/22/17 for CSW to communicate with client about current client needs and status. Client sees Dr. Hilma Favors as primary care doctor. Client has prescribed medications and is taking medications as prescribed. Client had been residing at home and had been receiving home health nursing services related to wound care of right foot of client. Client has support from his sister.  Client admitted recently to [facility] in [place], [place] related to wound care for right foot.  He has been admitted to [facility] since 02/18/17.  Client and CSW spoke of client care plan. CSW encouraged client to communicate with CSW in next 30 days to discuss community resources of assistance for client. Client receives dialysis 3 times weekly. Client is eating adequately.  Client is sleeping adequately. Client had just returned from dialysis treatment. Client was not sure how long he would receive care at the hospital.  Client's sister was visiting him at the hospital at time of CSW call to client.  CSW reminded client that [facility] program services were in areas of nursing, social work and pharmacy.  CSW encouraged client to call CSW at [phone] as needed to discuss social work needs of client.      Plan:  Client to communicate with CSW in next 30 days to discuss community resources of assistance for client.  CSW and RN Jacqlyn Larsen to collaborate in monitoring ongoing client needs.  CSW to call client in 3 weeks to assess client needs at that time.  Norva Riffle.Chantil Bari MSW, LCSW Licensed Clinical Social Worker [facility] Care Management [phone]

## 2017-02-22 NOTE — Progress Notes (Addendum)
PROGRESS NOTE    Jacob [name]  YWV:[id number] DOB: Apr 27, 1951 DOA: 02/18/2017 PCP: Sharilyn Sites, MD  Outpatient Specialists:   Dr Donzetta Matters  Brief Narrative:   64 resident of [facility], [place] Diabetes mellitus insulin-dependent with neuropathy Anemia of renal disease Hypertension Prior CVA Prior nonischemic cardiomyopathy with improvement  Grade 1 diastolic dysfunction per echo 12/2011  Cataract surgery 2015 ESRD MWF per extremity AV fistula-  recent revascularization right lower extremity posterior tibial/anterior tibial arteries-drug-coated angioma right up pretty artery  Transmetatarsal amputation 6.1.18-seen at vascular office 7/17 with poor healing  Had been getting wound follow-up at [facility] with silver alginate--outpatient management has failed  Admitted through emergency room 02/18/2017 with wound dehiscence marked leukocytosis afebrile Vascular surgery consulted considering surgery Nephrology has been consult  Amputation being considered after d/w dr. Trula Slade 7/23 am  Assessment & Plan:   Principal Problem:   Wound infection Active Problems:   End-stage renal disease on hemodialysis [facility])   Essential hypertension, benign   Insulin-requiring or dependent type II diabetes mellitus ([place])   Normocytic anemia   Hyponatremia   Right foot infection   Wound dehiscence-recent transmetatarsal amputation Diabetic ulcer L great toe as well now Sepsis-source is forefoot infection MRSA bacteremia Abnormal MRI 7/20 discitis?-I do not think that this is actual etiology  Continue vancomycin  Vascular surgery suggesting need for aortogram 7/24, debridement R TMA-? BKA near future    Toxic metabolic encephalopathy  Sepsis  Meds improved with Rx sepsis Pain control with Percocet every 4 when necessary, Dilaudid 1 mg breakthrough--do not escalate pain meds given somnolence most of the day 7/21  ESRD MWF [facility] 2/2 diabetic nephrosclerosis, hypertensive  glomerulopathy Secondary hyperparathyroidism Dialyzing via right upper extremity AV fistula anemia of renal disease  Nephrology consulted  Blood pressure low earlier in admission-has resolved--was 2/2 to sepsis + infection  Continue Sensipar 30 every morning, Renvela 2.4 g 3 times a day meal   iron studies-low Fe and tsat--usual hemoglobin ~ 9, now in 7 range-defer to nephro  ? Acute blood loss anemia  Unclear if at this time any GI bleed--he has been constipated  Transfuse 2 units PRBC 02/22/2017 and recheck labs 7/24  Prior CVA  Unclear when this was after review of notes  Continue Plavix, aspirin  Prior nonischemic cardiomyopathy Last echocardiogram EF 55-60% 03/07/2015 HTN-mild hypotension at times  Continue Coreg 6.25 nondialysis days-STTS--increase if trends stay above 140  No lisinopril 20 mg given hypotension  Seems stable otherwise  ? Seizure? Bipolar-stable and improved  Is on Depakote 500 -bid not taken for 2 months  Indication = alcoholic seizures- last had a drink 2 months ago  D/c depakote for now   Diabetes mellitus2 Diabetic ulcer L great toe--worsening--appreciate wound nurse input-moist dressings twice a day, Prevnar and boots  Home meds = 20 units at bedtime Lantus  Continue sliding scale, continue lower dose of Lantus 15 at bedtime  Sugars between 90 and 100   Lovenox Long discussion with family who are understandably upset about loss of limb possibility--urged them to discuss with VVS when they round in am tomorrow ? Likely BKA dispo unclear   Consultants:   Donzetta Matters, VVS  [facility]   Procedures:    none  Antimicrobials:   As above     Subjective:  Awake alert states and pain Eating and drinking fairly States pain is mainly in hip and back Some constipation   Objective: Vitals:   02/22/17 0930 02/22/17 1000 02/22/17 1100 02/22/17 1130  BP: (!) 117/57 (!) 144/63 (!) 125/40 (!) 148/47  Pulse: 66 64 63 66  Resp: 20  20 18   Temp:   98.6 F (37 C) 98.3 F (36.8 C)   TempSrc:  Oral Oral   SpO2:      Weight:      Height:        Intake/Output Summary (Last 24 hours) at 02/22/17 1752 Last data filed at 02/22/17 1010  Gross per 24 hour  Intake             1445 ml  Output                0 ml  Net             1445 ml   Filed Weights   02/20/17 0542 02/22/17 0500 02/22/17 0745  Weight: 125.9 kg (277 lb 9 oz) 121.5 kg (267 lb 13.7 oz) 128.5 kg (283 lb 4.7 oz)    Examination:  Awake pleasant Mallampati 2 Arcus senilis present, pterygium bilaterally No pallor no icterus S1-S2 distant heart sounds holosystolic murmur Abdomen soft Right lower extremity and stump has denuded and dehisced sutures and has slough-malodorous  L great toe has ulcer 2 x 3 cm   Data Reviewed: I have personally reviewed following labs and imaging studies  CBC:  Recent Labs Lab 02/18/17 1821 02/18/17 2325 02/19/17 0503 02/22/17 0310  WBC 20.8* 19.4* 21.3* 17.1*  NEUTROABS 17.6*  --  18.1*  --   HGB 8.0* 7.8* 7.7* 6.9*  HCT 26.6* 25.6* 25.9* 22.7*  MCV 84.7 82.8 84.4 80.5  PLT 353 368 396 174*   Basic Metabolic Panel:  Recent Labs Lab 02/18/17 1821 02/18/17 2325 02/19/17 0503 02/22/17 0310  NA 131* 130* 134* 130*  K 3.8 3.6 3.8 3.6  CL 94* 92* 94* 92*  CO2 25 26 27 26   GLUCOSE 175* 174* 152* 71  BUN 24* 25* 27* 47*  CREATININE 6.23* 6.45* 6.66* 8.57*  CALCIUM 8.5* 8.6* 8.7* 8.0*  PHOS  --  2.6  --   --    GFR: Estimated Creatinine Clearance: 11.9 mL/min (A) (by C-G formula based on SCr of 8.57 mg/dL (H)). Liver Function Tests:  Recent Labs Lab 02/18/17 1821 02/18/17 2325 02/22/17 0310  AST 19  --  26  ALT 9*  --  9*  ALKPHOS 184*  --  193*  BILITOT 0.4  --  0.9  PROT 8.1  --  7.9  ALBUMIN 2.2* 2.2* 1.9*   No results for input(s): LIPASE, AMYLASE in the last 168 hours. No results for input(s): AMMONIA in the last 168 hours. Coagulation Profile: No results for input(s): INR, PROTIME in the last 168  hours. Cardiac Enzymes: No results for input(s): CKTOTAL, CKMB, CKMBINDEX, TROPONINI in the last 168 hours. BNP (last 3 results) No results for input(s): PROBNP in the last 8760 hours. HbA1C: No results for input(s): HGBA1C in the last 72 hours. CBG:  Recent Labs Lab 02/21/17 1617 02/21/17 2132 02/22/17 0703 02/22/17 1217 02/22/17 1703  GLUCAP 144* 155* 59* 91 100*   Lipid Profile: No results for input(s): CHOL, HDL, LDLCALC, TRIG, CHOLHDL, LDLDIRECT in the last 72 hours. Thyroid Function Tests: No results for input(s): TSH, T4TOTAL, FREET4, T3FREE, THYROIDAB in the last 72 hours. Anemia Panel:  Recent Labs  02/20/17 0520  FERRITIN 909*  TIBC 106*  IRON 10*   Urine analysis: No results found for: COLORURINE, APPEARANCEUR, LABSPEC, [place], GLUCOSEU, [place], BILIRUBINUR, KETONESUR, PROTEINUR, UROBILINOGEN, NITRITE, LEUKOCYTESUR  Radiology Studies: No results found.   Scheduled Meds: . aspirin EC  325 mg Oral Daily  . atorvastatin  40 mg Oral q1800  . carvedilol  6.25 mg Oral 2 times per day on Sun Tue Thu Sat  . carvedilol  6.25 mg Oral Once per day on Mon Wed Fri  . Chlorhexidine Gluconate Cloth  6 each Topical Q0600  . cinacalcet  30 mg Oral Q breakfast  . clopidogrel  75 mg Oral Daily  . darbepoetin (ARANESP) injection - DIALYSIS  60 mcg Intravenous Q Fri-HD  . doxercalciferol  1.5 mcg Intravenous Q M,W,F-HD  . feeding supplement (NEPRO CARB STEADY)  237 mL Oral Q1500  . feeding supplement (PRO-STAT SUGAR FREE 64)  30 mL Oral Daily  . finasteride  5 mg Oral Daily  . heparin  5,000 Units Subcutaneous Q8H  . insulin aspart  0-5 Units Subcutaneous QHS  . insulin aspart  0-9 Units Subcutaneous TID WC  . insulin glargine  15 Units Subcutaneous QHS  . multivitamin  1 tablet Oral Q M,W,F-2000  . mupirocin ointment  1 application Nasal BID  . pantoprazole  40 mg Oral Daily  . sevelamer carbonate  2,400 mg Oral TID WC  . sodium chloride flush  3 mL Intravenous  Q12H   Continuous Infusions: . sodium chloride    . sodium chloride    . vancomycin Stopped (02/22/17 1334)     LOS: 4 days    Time spent: [place], MD [facility]   If 7PM-7AM, please contact night-coverage [email or web address] Password [facility] 02/22/2017, 5:52 PM

## 2017-02-23 ENCOUNTER — Encounter (HOSPITAL_COMMUNITY)
Admission: EM | Disposition: A | Discharge status: Skilled Nursing Facility | Payer: Self-pay | Source: Home / Self Care | Attending: Internal Medicine

## 2017-02-23 DIAGNOSIS — G9340 Encephalopathy, unspecified: Secondary | ICD-10-CM

## 2017-02-23 HISTORY — PX: ABDOMINAL AORTOGRAM W/LOWER EXTREMITY: CATH118223

## 2017-02-23 HISTORY — PX: PERIPHERAL VASCULAR BALLOON ANGIOPLASTY: CATH118281

## 2017-02-23 LAB — CBC WITH DIFFERENTIAL/PLATELET
Basophils Absolute: 0.1 10*3/uL (ref 0.0–0.1)
Basophils Relative: 0 %
Eosinophils Absolute: 1.4 10*3/uL — ABNORMAL HIGH (ref 0.0–0.7)
Eosinophils Relative: 7 %
HEMATOCRIT: 27.8 % — AB (ref 39.0–52.0)
HEMOGLOBIN: 8.7 g/dL — AB (ref 13.0–17.0)
LYMPHS ABS: 1.8 10*3/uL (ref 0.7–4.0)
LYMPHS PCT: 9 %
MCH: 25.6 pg — AB (ref 26.0–34.0)
MCHC: 31.3 g/dL (ref 30.0–36.0)
MCV: 81.8 fL (ref 78.0–100.0)
MONOS PCT: 5 %
Monocytes Absolute: 1 10*3/uL (ref 0.1–1.0)
NEUTROS PCT: 79 %
Neutro Abs: 15.5 10*3/uL — ABNORMAL HIGH (ref 1.7–7.7)
Platelets: 508 10*3/uL — ABNORMAL HIGH (ref 150–400)
RBC: 3.4 MIL/uL — AB (ref 4.22–5.81)
RDW: 17.1 % — ABNORMAL HIGH (ref 11.5–15.5)
WBC: 19.8 10*3/uL — AB (ref 4.0–10.5)

## 2017-02-23 LAB — TYPE AND SCREEN
ABO/RH(D): O POS
Antibody Screen: NEGATIVE
UNIT DIVISION: 0
Unit division: 0

## 2017-02-23 LAB — GLUCOSE, CAPILLARY
GLUCOSE-CAPILLARY: 117 mg/dL — AB (ref 65–99)
GLUCOSE-CAPILLARY: 123 mg/dL — AB (ref 65–99)
Glucose-Capillary: 161 mg/dL — ABNORMAL HIGH (ref 65–99)
Glucose-Capillary: 155 mg/dL — ABNORMAL HIGH (ref 65–99)

## 2017-02-23 LAB — BPAM RBC
BLOOD PRODUCT EXPIRATION DATE: 201807282359
BLOOD PRODUCT EXPIRATION DATE: 201807292359
ISSUE DATE / TIME: 201807230857
ISSUE DATE / TIME: 201807230857
UNIT TYPE AND RH: 5100
Unit Type and Rh: 5100

## 2017-02-23 LAB — VAS US LOWER EXTREMITY ARTERIAL DUPLEX
RPOPDPSV: -54 cm/s
RPOPPPSV: 46 cm/s
RSFMPSV: -90 cm/s
RSFPPSV: 68 cm/s
Right super femoral dist sys PSV: -90 cm/s

## 2017-02-23 LAB — POCT ACTIVATED CLOTTING TIME
ACTIVATED CLOTTING TIME: 213 s
Activated Clotting Time: 164 seconds

## 2017-02-23 SURGERY — ABDOMINAL AORTOGRAM W/LOWER EXTREMITY
Anesthesia: LOCAL

## 2017-02-23 MED ORDER — HEPARIN (PORCINE) IN NACL 2-0.9 UNIT/ML-% IJ SOLN
INTRAMUSCULAR | Status: AC
  Filled 2017-02-23: qty 500

## 2017-02-23 MED ORDER — METOPROLOL TARTRATE 5 MG/5ML IV SOLN: 2.0000 mg | INTRAVENOUS | Status: DC | PRN

## 2017-02-23 MED ORDER — LIDOCAINE HCL (PF) 1 % IJ SOLN
INTRAMUSCULAR | Status: DC | PRN
  Administered 2017-02-23: 18 mL

## 2017-02-23 MED ORDER — FENTANYL CITRATE (PF) 100 MCG/2ML IJ SOLN
INTRAMUSCULAR | Status: AC
  Filled 2017-02-23: qty 2

## 2017-02-23 MED ORDER — HEPARIN SODIUM (PORCINE) 1000 UNIT/ML DIALYSIS: 1000.0000 [IU] | INTRAMUSCULAR | Status: DC | PRN

## 2017-02-23 MED ORDER — MIDAZOLAM HCL 2 MG/2ML IJ SOLN
INTRAMUSCULAR | Status: DC | PRN
  Administered 2017-02-23 (×2): 1 mg via INTRAVENOUS

## 2017-02-23 MED ORDER — HEPARIN SODIUM (PORCINE) 1000 UNIT/ML IJ SOLN
INTRAMUSCULAR | Status: AC
  Filled 2017-02-23: qty 1

## 2017-02-23 MED ORDER — FENTANYL CITRATE (PF) 100 MCG/2ML IJ SOLN
INTRAMUSCULAR | Status: DC | PRN
  Administered 2017-02-23 (×2): 25 ug via INTRAVENOUS

## 2017-02-23 MED ORDER — HEPARIN (PORCINE) IN NACL 2-0.9 UNIT/ML-% IJ SOLN
INTRAMUSCULAR | Status: AC | PRN
  Administered 2017-02-23: 1000 mL

## 2017-02-23 MED ORDER — ALTEPLASE 2 MG IJ SOLR: 2.0000 mg | Freq: Once | INTRAMUSCULAR | Status: DC | PRN

## 2017-02-23 MED ORDER — SODIUM CHLORIDE 0.9 % IV SOLN: 500.0000 mL | Freq: Once | INTRAVENOUS | Status: DC | PRN

## 2017-02-23 MED ORDER — PENTAFLUOROPROP-TETRAFLUOROETH EX AERO: 1.0000 "application " | INHALATION_SPRAY | CUTANEOUS | Status: DC | PRN

## 2017-02-23 MED ORDER — SODIUM CHLORIDE 0.9 % IV SOLN: 100.0000 mL | INTRAVENOUS | Status: DC | PRN

## 2017-02-23 MED ORDER — ALUM & MAG HYDROXIDE-SIMETH 200-200-20 MG/5ML PO SUSP: 15.0000 mL | ORAL | Status: DC | PRN

## 2017-02-23 MED ORDER — LIDOCAINE HCL (PF) 1 % IJ SOLN: 5.0000 mL | INTRAMUSCULAR | Status: DC | PRN

## 2017-02-23 MED ORDER — DOCUSATE SODIUM 100 MG PO CAPS
100.0000 mg | ORAL_CAPSULE | Freq: Every day | ORAL | Status: DC
  Filled 2017-02-23: qty 1

## 2017-02-23 MED ORDER — HYDRALAZINE HCL 20 MG/ML IJ SOLN: 5.0000 mg | INTRAMUSCULAR | Status: DC | PRN

## 2017-02-23 MED ORDER — MIDAZOLAM HCL 2 MG/2ML IJ SOLN
INTRAMUSCULAR | Status: AC
  Filled 2017-02-23: qty 2

## 2017-02-23 MED ORDER — LIDOCAINE HCL (PF) 1 % IJ SOLN
INTRAMUSCULAR | Status: AC
  Filled 2017-02-23: qty 30

## 2017-02-23 MED ORDER — LIDOCAINE-PRILOCAINE 2.5-2.5 % EX CREA
1.0000 "application " | TOPICAL_CREAM | CUTANEOUS | Status: DC | PRN
  Filled 2017-02-23: qty 5

## 2017-02-23 MED ORDER — PHENOL 1.4 % MT LIQD: 1.0000 | OROMUCOSAL | Status: DC | PRN

## 2017-02-23 MED ORDER — HEPARIN SODIUM (PORCINE) 1000 UNIT/ML IJ SOLN
INTRAMUSCULAR | Status: DC | PRN
  Administered 2017-02-23: 10000 [IU] via INTRAVENOUS

## 2017-02-23 MED ORDER — CARVEDILOL 12.5 MG PO TABS
12.5000 mg | ORAL_TABLET | ORAL | Status: DC
  Administered 2017-02-25 – 2017-03-02 (×8): 12.5 mg via ORAL
  Filled 2017-02-23 (×8): qty 1

## 2017-02-23 MED ORDER — LABETALOL HCL 5 MG/ML IV SOLN: 10.0000 mg | INTRAVENOUS | Status: DC | PRN

## 2017-02-23 SURGICAL SUPPLY — 24 items
BAG SNAP BAND KOVER 36X36 (MISCELLANEOUS) ×2 IMPLANT
BALLN COYOTE OTW 2.5X100X150 (BALLOONS) ×3
BALLOON COYOTE OTW 2.5X100X150 (BALLOONS) IMPLANT
CATH INFINITI VERT 5FR 125CM (CATHETERS) ×1 IMPLANT
CATH OMNI FLUSH 5F 65CM (CATHETERS) ×1 IMPLANT
CATH SOFT-VU 4F 65 STRAIGHT (CATHETERS) IMPLANT
CATH SOFT-VU STRAIGHT 4F 65CM (CATHETERS) ×3
COVER DOME SNAP 22 D (MISCELLANEOUS) ×1 IMPLANT
COVER PRB 48X5XTLSCP FOLD TPE (BAG) IMPLANT
COVER PROBE 5X48 (BAG) ×3
GUIDEWIRE STR TIP .014X300X8 (WIRE) ×1 IMPLANT
HOVERMATT SINGLE USE (MISCELLANEOUS) ×1 IMPLANT
KIT MICROINTRODUCER STIFF 5F (SHEATH) ×1 IMPLANT
KIT PV (KITS) ×3 IMPLANT
SHEATH PINNACLE 5F 10CM (SHEATH) ×1 IMPLANT
SHEATH PINNACLE 7F 10CM (SHEATH) ×1 IMPLANT
SHEATH SHUTTLE SELECT 6F (SHEATH) ×1 IMPLANT
SHIELD RADPAD SCOOP 12X17 (MISCELLANEOUS) ×1 IMPLANT
SYR MEDRAD MARK V 150ML (SYRINGE) ×3 IMPLANT
TAPE RADIOPAQUE TURBO (MISCELLANEOUS) ×1 IMPLANT
TRANSDUCER W/STOPCOCK (MISCELLANEOUS) ×3 IMPLANT
TRAY PV CATH (CUSTOM PROCEDURE TRAY) ×3 IMPLANT
WIRE BENTSON .035X145CM (WIRE) ×1 IMPLANT
WIRE ROSEN-J .035X180CM (WIRE) ×1 IMPLANT

## 2017-02-23 NOTE — Op Note (Signed)
Patient name: Jacob [name] MRN: [id number] DOB: Apr 15, 1951 Sex: male  02/18/2017 - 02/23/2017 Pre-operative Diagnosis: Nonhealing wound, right leg Post-operative diagnosis:  Same Surgeon:  Annamarie Major Procedure Performed:  1.  Ultrasound-guided access, left femoral artery  2.  Abdominal aortogram  3.  Right lower extremity runoff  4.  Additional order catheterization (right anterior tibial artery)  5.  Failed attempted angioplasty, right anterior tibial artery  6.  Conscious sedation (24minutes)     Indications:  The patient is previously undergone percutaneous revascularization.  He then underwent transmetatarsal amputation which has not healed.  Recent duplex suggested tibial vessel occlusion, therefore he comes in for further angiographic evaluation  Procedure:  The patient was identified in the holding area and taken to room 8.  The patient was then placed supine on the table and prepped and draped in the usual sterile fashion.  A time out was called.  Conscious sedation was administered with the use of IV fentanyl and Versed under continuous physician and nurse moderately.  Heart rate, blood pressure, and oxygen saturation continuously monitored.  Ultrasound was used to evaluate the left common femoral artery.  It was patent .  A digital ultrasound image was acquired.  A micropuncture needle was used to access the left common femoral artery under ultrasound guidance.  An 018 wire was advanced without resistance and a micropuncture sheath was placed.  The 018 wire was removed and a benson wire was placed.  The micropuncture sheath was exchanged for a 5 french sheath.  An omniflush catheter was advanced over the wire to the level of L-1.  An abdominal angiogram was obtained.  Next, using the omniflush catheter and a benson wire, the aortic bifurcation was crossed and the catheter was placed into theright external iliac artery and right runoff was obtained.   Findings:   Aortogram:  No  significant renal artery stenosis.  No infrarenal aortic stenosis.  Bilateral iliac arteries are widely patent  Right Lower Extremity:  The right common femoral profunda femoral and superficial femoral artery are patent without stenosis.  The popliteal artery is patent throughout it's course.  There is mild luminal irregularity at the curve of the anterior tibial artery.  Anterior tibial is patent down to just above the ankle where it occludes.  The posterior tibial and peroneal arteries are occluded  Left Lower Extremity:  Not evaluated  Intervention:  After the above images were acquired, the decision was made to proceed with intervention.  A 7 French 90 cm sheath was advanced into the right popliteal artery.  I used a the 14 wire and a magic catheter to select the anterior tibial artery.  The Magic catheter was removed and I used a 2.5 x 100, the balloon for a support catheter.  This was advanced down to the distal anterior tibial artery where it occluded.  I took additional imaging at this level and what appeared to be a distal target was no longer visible.  I tried to probe with a wire but was unable to get reentry.  I did not feel that the patient had any additional options.  Catheters and wires were removed.  The patient be taken the holding area for sheath pull once his coagulation profile corrects.  Impression:  #1   the right posterior tibial artery is occluded just beyond its origin  #2   the right anterior tibial artery occludes in the distal leg.  No distal targets are present.  The artery at  this level was explored without successful reentry   #3   no further options for revascularization.     Theotis Burrow, M.D. [facility] [place] Office: [phone] Pager:  [phone]

## 2017-02-23 NOTE — Progress Notes (Signed)
Site area: left groin  Site Prior to Removal:  Level 0  Pressure Applied For 20 MINUTES    Minutes Beginning at 1715  Manual:   Yes.    Patient Status During Pull:  Stable   Post Pull Groin Site:  Level 0  Post Pull Instructions Given:  Yes.    Post Pull Pulses Present:  Yes.    Dressing Applied:  Yes.    Comments:  Bedrest started at 66

## 2017-02-23 NOTE — Progress Notes (Signed)
[place] for Infectious Disease   Reason for visit: Follow up on MRSA bacteremia  Interval History: repeat blood cultures remain ngtd; currently NPO for foot debridement this am; WBC 19.8;  Vancomycin day 6  Physical Exam: Constitutional:  Vitals:   02/22/17 1130 02/23/17 0444  BP: (!) 148/47 (!) 142/58  Pulse: 66 75  Resp: 18 18  Temp:  99 F (37.2 C)   patient appears in NAD HENT: mm dry Respiratory: Normal respiratory effort; CTA B Cardiovascular: RRR GI: soft, nt, nd Neuro: alert, oriented  Review of Systems: Constitutional: negative for fevers and chills Gastrointestinal: negative for nausea and diarrhea Integument/breast: negative for rash  Lab Results  Component Value Date   WBC 19.8 (H) 02/23/2017   HGB 8.7 (L) 02/23/2017   HCT 27.8 (L) 02/23/2017   MCV 81.8 02/23/2017   PLT 508 (H) 02/23/2017    Lab Results  Component Value Date   CREATININE 8.57 (H) 02/22/2017   BUN 47 (H) 02/22/2017   NA 130 (L) 02/22/2017   K 3.6 02/22/2017   CL 92 (L) 02/22/2017   CO2 26 02/22/2017    Lab Results  Component Value Date   ALT 9 (L) 02/22/2017   AST 26 02/22/2017   ALKPHOS 193 (H) 02/22/2017     Microbiology: Recent Results (from the past 240 hour(s))  Culture, blood (routine x 2)     Status: Abnormal   Collection Time: 02/18/17  6:21 PM  Result Value Ref Range Status   Specimen Description BLOOD LEFT HAND  Final   Special Requests IN PEDIATRIC BOTTLE Blood Culture adequate volume  Final   Culture  Setup Time   Final    GRAM POSITIVE COCCI IN CLUSTERS AEROBIC BOTTLE ONLY CRITICAL RESULT CALLED TO, READ BACK BY AND VERIFIED WITH: L. Huntley Dec.D. 15:30 02/19/17 (wilsonm)    Culture (A)  Final    STAPHYLOCOCCUS AUREUS SUSCEPTIBILITIES PERFORMED ON PREVIOUS CULTURE WITHIN THE LAST 5 DAYS.    Report Status 02/21/2017 FINAL  Final  Blood Culture ID Panel (Reflexed)     Status: Abnormal   Collection Time: 02/18/17  6:21 PM  Result Value Ref Range  Status   Enterococcus species NOT DETECTED NOT DETECTED Final   Listeria monocytogenes NOT DETECTED NOT DETECTED Final   Staphylococcus species DETECTED (A) NOT DETECTED Final    Comment: CRITICAL RESULT CALLED TO, READ BACK BY AND VERIFIED WITH: L. Huntley Dec.D. 15:30 02/19/17 (wilsonm)    Staphylococcus aureus DETECTED (A) NOT DETECTED Final    Comment: Methicillin (oxacillin)-resistant Staphylococcus aureus (MRSA). MRSA is predictably resistant to beta-lactam antibiotics (except ceftaroline). Preferred therapy is vancomycin unless clinically contraindicated. Patient requires contact precautions if  hospitalized. CRITICAL RESULT CALLED TO, READ BACK BY AND VERIFIED WITH: L. Huntley Dec.D. 15:30 02/19/17 (wilsonm)    Methicillin resistance DETECTED (A) NOT DETECTED Final    Comment: CRITICAL RESULT CALLED TO, READ BACK BY AND VERIFIED WITH: L. Huntley Dec.D. 15:30 02/19/17 (wilsonm)    Streptococcus species NOT DETECTED NOT DETECTED Final   Streptococcus agalactiae NOT DETECTED NOT DETECTED Final   Streptococcus pneumoniae NOT DETECTED NOT DETECTED Final   Streptococcus pyogenes NOT DETECTED NOT DETECTED Final   Acinetobacter baumannii NOT DETECTED NOT DETECTED Final   Enterobacteriaceae species NOT DETECTED NOT DETECTED Final   Enterobacter cloacae complex NOT DETECTED NOT DETECTED Final   Escherichia coli NOT DETECTED NOT DETECTED Final   Klebsiella oxytoca NOT DETECTED NOT DETECTED Final   Klebsiella pneumoniae NOT DETECTED NOT DETECTED  Final   Proteus species NOT DETECTED NOT DETECTED Final   Serratia marcescens NOT DETECTED NOT DETECTED Final   Haemophilus influenzae NOT DETECTED NOT DETECTED Final   Neisseria meningitidis NOT DETECTED NOT DETECTED Final   Pseudomonas aeruginosa NOT DETECTED NOT DETECTED Final   Candida albicans NOT DETECTED NOT DETECTED Final   Candida glabrata NOT DETECTED NOT DETECTED Final   Candida krusei NOT DETECTED NOT DETECTED Final   Candida  parapsilosis NOT DETECTED NOT DETECTED Final   Candida tropicalis NOT DETECTED NOT DETECTED Final  Culture, blood (routine x 2)     Status: Abnormal   Collection Time: 02/18/17  9:34 PM  Result Value Ref Range Status   Specimen Description BLOOD LEFT ARM  Final   Special Requests   Final    BOTTLES DRAWN AEROBIC AND ANAEROBIC Blood Culture adequate volume   Culture  Setup Time   Final    GRAM POSITIVE COCCI IN BOTH AEROBIC AND ANAEROBIC BOTTLES CRITICAL RESULT CALLED TO, READ BACK BY AND VERIFIED WITH: L. Huntley Dec.D. 15:30 02/19/17 (wilsonm)    Culture METHICILLIN RESISTANT STAPHYLOCOCCUS AUREUS (A)  Final   Report Status 02/21/2017 FINAL  Final   Organism ID, Bacteria METHICILLIN RESISTANT STAPHYLOCOCCUS AUREUS  Final      Susceptibility   Methicillin resistant staphylococcus aureus - MIC*    CIPROFLOXACIN >=8 RESISTANT Resistant     ERYTHROMYCIN >=8 RESISTANT Resistant     GENTAMICIN <=0.5 SENSITIVE Sensitive     OXACILLIN >=4 RESISTANT Resistant     TETRACYCLINE <=1 SENSITIVE Sensitive     VANCOMYCIN 1 SENSITIVE Sensitive     TRIMETH/SULFA <=10 SENSITIVE Sensitive     CLINDAMYCIN <=0.25 SENSITIVE Sensitive     RIFAMPIN <=0.5 SENSITIVE Sensitive     Inducible Clindamycin NEGATIVE Sensitive     * METHICILLIN RESISTANT STAPHYLOCOCCUS AUREUS  MRSA PCR Screening     Status: None   Collection Time: 02/18/17 11:22 PM  Result Value Ref Range Status   MRSA by PCR NEGATIVE NEGATIVE Final    Comment:        The GeneXpert MRSA Assay (FDA approved for NASAL specimens only), is one component of a comprehensive MRSA colonization surveillance program. It is not intended to diagnose MRSA infection nor to guide or monitor treatment for MRSA infections.   MRSA PCR Screening     Status: Abnormal   Collection Time: 02/18/17 11:22 PM  Result Value Ref Range Status   MRSA by PCR POSITIVE (A) NEGATIVE Final    Comment:        The GeneXpert MRSA Assay (FDA approved for NASAL  specimens only), is one component of a comprehensive MRSA colonization surveillance program. It is not intended to diagnose MRSA infection nor to guide or monitor treatment for MRSA infections. RESULT CALLED TO, READ BACK BY AND VERIFIED WITH: DULLA,R RN 2353 02/19/17 MITCHELL,L   Culture, blood (routine x 2)     Status: None (Preliminary result)   Collection Time: 02/20/17  5:20 AM  Result Value Ref Range Status   Specimen Description BLOOD LEFT ANTECUBITAL  Final   Special Requests   Final    BOTTLES DRAWN AEROBIC AND ANAEROBIC Blood Culture adequate volume   Culture NO GROWTH 2 DAYS  Final   Report Status PENDING  Incomplete  Culture, blood (routine x 2)     Status: None (Preliminary result)   Collection Time: 02/20/17  5:21 AM  Result Value Ref Range Status   Specimen Description BLOOD LEFT HAND  Final  Special Requests   Final    BOTTLES DRAWN AEROBIC ONLY Blood Culture adequate volume   Culture NO GROWTH 2 DAYS  Final   Report Status PENDING  Incomplete    Impression/Plan:  1. MRSA bactermia - on vancomycin with dialysis.  Likely foot as the source.  Debridement today and considering BKA.   Would treat for 4 weeks from negative blood culture No absolute indication for TEE so will defer unless blood cultures persistently positive  2. Encephalopathy - improved.

## 2017-02-23 NOTE — H&P (View-Only) (Signed)
  Progress Note    02/22/2017 3:17 PM * No surgery date entered *  Subjective:  Having right hip/buttock pain  Vitals:   02/22/17 1100 02/22/17 1130  BP: (!) 125/40 (!) 148/47  Pulse: 63 66  Resp: 20 18  Temp: 98.3 F (36.8 C)     Physical Exam: aaox3 Abdomen is soft Right thigh is soft, palpable femoral pulse  mulitphasic right AT at ankle  CBC    Component Value Date/Time   WBC 17.1 (H) 02/22/2017 0310   RBC 2.82 (L) 02/22/2017 0310   HGB 6.9 (LL) 02/22/2017 0310   HCT 22.7 (L) 02/22/2017 0310   PLT 420 (H) 02/22/2017 0310   MCV 80.5 02/22/2017 0310   MCH 24.5 (L) 02/22/2017 0310   MCHC 30.4 02/22/2017 0310   RDW 17.5 (H) 02/22/2017 0310   LYMPHSABS 1.3 02/19/2017 0503   MONOABS 1.6 (H) 02/19/2017 0503   EOSABS 0.3 02/19/2017 0503   BASOSABS 0.1 02/19/2017 0503    BMET    Component Value Date/Time   NA 130 (L) 02/22/2017 0310   K 3.6 02/22/2017 0310   CL 92 (L) 02/22/2017 0310   CO2 26 02/22/2017 0310   GLUCOSE 71 02/22/2017 0310   BUN 47 (H) 02/22/2017 0310   CREATININE 8.57 (H) 02/22/2017 0310   CALCIUM 8.0 (L) 02/22/2017 0310   GFRNONAA 6 (L) 02/22/2017 0310   GFRAA 7 (L) 02/22/2017 0310    INR    Component Value Date/Time   INR 1.30 12/30/2016 0226     Intake/Output Summary (Last 24 hours) at 02/22/17 1517 Last data filed at 02/22/17 1010  Gross per 24 hour  Intake             1445 ml  Output                0 ml  Net             1445 ml     Assessment:  66 y.o. male is s/p RLE endo revascularization and tma now with wound dehiscence.   Plan: Aortogram with Dr. Trula Slade tomorrow Npo at midnight  Discussed likelihood of at least debridement of right tma site with possible need for bka in near future   Garceno C. Donzetta Matters, MD [facility] [place] Office: [phone] Pager: [phone]  02/22/2017 3:17 PM

## 2017-02-23 NOTE — Progress Notes (Signed)
Dr. Trula Slade notified that patient was transferring to another unit, requested patient be transferred to [place], patient placement aware

## 2017-02-23 NOTE — Progress Notes (Signed)
Report called to [facility] on 4east.  Pt to be transferred via bed bc pt is still on bedrest til 2335. All belongings with pt.

## 2017-02-23 NOTE — Progress Notes (Signed)
PROGRESS NOTE    Jacob [name]  PNT:[id number] DOB: 01/24/1951 DOA: 02/18/2017 PCP: Sharilyn Sites, MD  Outpatient Specialists:   Dr Idalia Needle  Brief Narrative:   75 resident of [facility], [place] Diabetes mellitus insulin-dependent with neuropathy Anemia of renal disease Hypertension Prior CVA Prior nonischemic cardiomyopathy with improvement  Grade 1 diastolic dysfunction per echo 12/2011  Cataract surgery 2015 ESRD MWF per extremity AV fistula-  recent revascularization right lower extremity posterior tibial/anterior tibial arteries-drug-coated angioma right up pretty artery  Transmetatarsal amputation 6.1.18-seen at vascular office 7/17 with poor healing  Had been getting wound follow-up at [facility] with silver alginate--outpatient management has failed  Admitted through emergency room 02/18/2017 with wound dehiscence marked leukocytosis afebrile Vascular surgery consulted considering surgery Nephrology has been consult  Amputation being considered after d/w dr. Trula Slade 7/23 am  Assessment & Plan:   Principal Problem:   Wound infection Active Problems:   End-stage renal disease on hemodialysis [facility])   Essential hypertension, benign   Insulin-requiring or dependent type II diabetes mellitus ([place])   Normocytic anemia   Hyponatremia   Right foot infection   Wound dehiscence-recent transmetatarsal amputation Diabetic ulcer L great toe as well now Sepsis-source is forefoot infection-MRSA bacteremia Abnormal MRI 7/20 discitis?-I do not think that this is actual etiology  Continue vancomycin  Vascular surgery did Angiogram 7/24-no targets--for amputation this Friday??   Toxic metabolic encephalopathy  Sepsis  Meds improved with Rx sepsis Pain control with Percocet every 4 when necessary, Dilaudid 1 mg breakthrough--do not escalate pain meds given somnolence most of the day 7/21  ESRD MWF [facility] 2/2 diabetic nephrosclerosis, hypertensive  glomerulopathy Secondary hyperparathyroidism Dialyzing via right upper extremity AV fistula  Blood pressure low earlier in admission-has resolved--was 2/2 to sepsis + infection  Continue Sensipar 30 every morning, Renvela 2.4 g 3 times a day meal  ? Acute blood loss anemia Anemia of chr diasease [sepsis] Anemia renal disease   iron studies-low Fe and tsat--usual hemoglobin ~ 9, now in 7 range-defer to nephro epo vs IV iron  Unclear if at this time any GI bleed--he has been constipated  Transfuse 2 units PRBC 02/22/2017 and recheck labs 7/24 shows hb 6.9--->8.7  Prior CVA  Unclear when  Continue Plavix, aspirin  Prior nonischemic cardiomyopathy Last echocardiogram EF 55-60% 03/07/2015 HTN-mild hypotension at times  Continue Coreg 6.25 nondialysis days-STTS--increased 12.5  7/24  lisinopril 20 mg held  Seems stable otherwise  ? Seizure? Bipolar-stable and improved  Is on Depakote 500 -bid not taken for 2 months  Indication = alcoholic seizures- last had a drink 2 months ago  D/c depakote for now   Diabetes mellitus2 Diabetic ulcer L great toe--worsening--appreciate wound nurse input-moist dressings twice a day, Prevnar and boots  Home meds = 20 units at bedtime Lantus  Continue sliding scale, continue lower dose of Lantus 15 at bedtime  Sugars between 117--155   Lovenoxw ? Likely BKA 7/27? dispo unclear   Consultants:   Donzetta Matters, VVS  [facility]-renal   Procedures:    none  Antimicrobials:   As above     Subjective:  Awake alert Hard to understand "i wanna go to a nursing home and I wanna choose" Sister at bedside clarifies VVS just discussed in room likely amputation soon   Objective: Vitals:   02/23/17 1358 02/23/17 1403 02/23/17 1408 02/23/17 1413  BP: (!) 167/77 (!) 166/81 (!) 167/80 (!) 166/81  Pulse: 78 79 77 76  Resp: 13 (!) 53 15 19  Temp:      TempSrc:      SpO2: 100% 100% 100% 100%  Weight:      Height:        Intake/Output Summary (Last  24 hours) at 02/23/17 1451 Last data filed at 02/23/17 0910  Gross per 24 hour  Intake              240 ml  Output                0 ml  Net              240 ml   Filed Weights   02/22/17 0500 02/22/17 0745 02/23/17 0700  Weight: 121.5 kg (267 lb 13.7 oz) 128.5 kg (283 lb 4.7 oz) 122.7 kg (270 lb 8.1 oz)    Examination:  Awake alert hoh eomi  Proptosis Chest clear Foot not examined abd soft nt nd no rebound  Data Reviewed: I have personally reviewed following labs and imaging studies  CBC:  Recent Labs Lab 02/18/17 1821 02/18/17 2325 02/19/17 0503 02/22/17 0310 02/23/17 0346  WBC 20.8* 19.4* 21.3* 17.1* 19.8*  NEUTROABS 17.6*  --  18.1*  --  15.5*  HGB 8.0* 7.8* 7.7* 6.9* 8.7*  HCT 26.6* 25.6* 25.9* 22.7* 27.8*  MCV 84.7 82.8 84.4 80.5 81.8  PLT 353 368 396 420* 161*   Basic Metabolic Panel:  Recent Labs Lab 02/18/17 1821 02/18/17 2325 02/19/17 0503 02/22/17 0310  NA 131* 130* 134* 130*  K 3.8 3.6 3.8 3.6  CL 94* 92* 94* 92*  CO2 25 26 27 26   GLUCOSE 175* 174* 152* 71  BUN 24* 25* 27* 47*  CREATININE 6.23* 6.45* 6.66* 8.57*  CALCIUM 8.5* 8.6* 8.7* 8.0*  PHOS  --  2.6  --   --    GFR: Estimated Creatinine Clearance: 11.6 mL/min (A) (by C-G formula based on SCr of 8.57 mg/dL (H)). Liver Function Tests:  Recent Labs Lab 02/18/17 1821 02/18/17 2325 02/22/17 0310  AST 19  --  26  ALT 9*  --  9*  ALKPHOS 184*  --  193*  BILITOT 0.4  --  0.9  PROT 8.1  --  7.9  ALBUMIN 2.2* 2.2* 1.9*   No results for input(s): LIPASE, AMYLASE in the last 168 hours. No results for input(s): AMMONIA in the last 168 hours. Coagulation Profile: No results for input(s): INR, PROTIME in the last 168 hours. Cardiac Enzymes: No results for input(s): CKTOTAL, CKMB, CKMBINDEX, TROPONINI in the last 168 hours. BNP (last 3 results) No results for input(s): PROBNP in the last 8760 hours. HbA1C: No results for input(s): HGBA1C in the last 72 hours. CBG:  Recent Labs Lab  02/22/17 1217 02/22/17 1703 02/22/17 2220 02/23/17 0705 02/23/17 1112  GLUCAP 91 100* 126* 161* 155*   Lipid Profile: No results for input(s): CHOL, HDL, LDLCALC, TRIG, CHOLHDL, LDLDIRECT in the last 72 hours. Thyroid Function Tests: No results for input(s): TSH, T4TOTAL, FREET4, T3FREE, THYROIDAB in the last 72 hours. Anemia Panel: No results for input(s): VITAMINB12, FOLATE, FERRITIN, TIBC, IRON, RETICCTPCT in the last 72 hours. Urine analysis: No results found for: COLORURINE, APPEARANCEUR, LABSPEC, [place], [place], [place], [place], [place], [place], [place], NITRITE, [place]   Radiology Studies: No results found.   Scheduled Meds: . [MAR Hold] aspirin EC  325 mg Oral Daily  . [MAR Hold] atorvastatin  40 mg Oral q1800  . [MAR Hold] carvedilol  6.25 mg Oral 2 times per day on Sun Tue Thu  Sat  . [MAR Hold] carvedilol  6.25 mg Oral Once per day on Mon Wed Fri  . [MAR Hold] Chlorhexidine Gluconate Cloth  6 each Topical Q0600  . [MAR Hold] cinacalcet  30 mg Oral Q breakfast  . [MAR Hold] clopidogrel  75 mg Oral Daily  . [MAR Hold] darbepoetin (ARANESP) injection - DIALYSIS  60 mcg Intravenous Q Fri-HD  . [MAR Hold] doxercalciferol  1.5 mcg Intravenous Q M,W,F-HD  . [MAR Hold] feeding supplement (NEPRO CARB STEADY)  237 mL Oral Q1500  . [MAR Hold] feeding supplement (PRO-STAT SUGAR FREE 64)  30 mL Oral Daily  . [MAR Hold] finasteride  5 mg Oral Daily  . [MAR Hold] heparin  5,000 Units Subcutaneous Q8H  . [MAR Hold] insulin aspart  0-5 Units Subcutaneous QHS  . [MAR Hold] insulin aspart  0-9 Units Subcutaneous TID WC  . [MAR Hold] insulin glargine  15 Units Subcutaneous QHS  . [MAR Hold] multivitamin  1 tablet Oral Q M,W,F-2000  . [MAR Hold] mupirocin ointment  1 application Nasal BID  . [MAR Hold] pantoprazole  40 mg Oral Daily  . [MAR Hold] sevelamer carbonate  2,400 mg Oral TID WC  . [MAR Hold] sodium chloride flush  3 mL Intravenous Q12H   Continuous  Infusions: . [MAR Hold] sodium chloride    . [MAR Hold] sodium chloride    . [MAR Hold] vancomycin Stopped (02/22/17 1334)     LOS: 5 days    Time spent: Villalba, MD [facility]   If 7PM-7AM, please contact night-coverage [email or web address] Password [email or web address] 02/23/2017, 2:51 PM

## 2017-02-23 NOTE — Interval H&P Note (Signed)
History and Physical Interval Note:  02/23/2017 12:29 PM  Jacob [name]  has presented today for surgery, with the diagnosis of non healing right foot wound  The various methods of treatment have been discussed with the patient and family. After consideration of risks, benefits and other options for treatment, the patient has consented to  Procedure(s): Abdominal Aortogram w/Lower Extremity (N/A) as a surgical intervention .  The patient's history has been reviewed, patient examined, no change in status, stable for surgery.  I have reviewed the patient's chart and labs.  Questions were answered to the patient's satisfaction.     Annamarie Major

## 2017-02-23 NOTE — Progress Notes (Signed)
[facility] ROUNDING NOTE   Subjective:   Interval History:61M ESRD MWF RUE AVF [place] with R TMA site infection and poor healing. Plan arteriogram today  Objective:  Vital signs in last 24 hours:  Temp:  [99 F (37.2 C)] 99 F (37.2 C) (07/24 0444) Pulse Rate:  [66-75] 75 (07/24 0444) Resp:  [18] 18 (07/24 0444) BP: (142-148)/(47-58) 142/58 (07/24 0444) SpO2:  [93 %] 93 % (07/24 0444) Weight:  [270 lb 8.1 oz (122.7 kg)] 270 lb 8.1 oz (122.7 kg) (07/24 0700)  Weight change: 15 lb 6.9 oz (7 kg) Filed Weights   02/22/17 0500 02/22/17 0745 02/23/17 0700  Weight: 267 lb 13.7 oz (121.5 kg) 283 lb 4.7 oz (128.5 kg) 270 lb 8.1 oz (122.7 kg)    Intake/Output: I/O last 3 completed shifts: In: 1685 [P.O.:240; I.V.:100; Blood:1345] Out: -    Intake/Output this shift:  No intake/output data recorded.  GEN: NAD, well appearing ENT: NCAT EYES: EOMI CV: RRR, no rub, nl s1s2 PULM: CTAB, nl wob ABD: s/t/nd' +BS SKIN: RLE bandaged, not examined, no rashes elsewhwere EXT:No LEE AVF RUE : +B/T   Basic Metabolic Panel:  Recent Labs Lab 02/18/17 1821 02/18/17 2325 02/19/17 0503 02/22/17 0310  NA 131* 130* 134* 130*  K 3.8 3.6 3.8 3.6  CL 94* 92* 94* 92*  CO2 25 26 27 26   GLUCOSE 175* 174* 152* 71  BUN 24* 25* 27* 47*  CREATININE 6.23* 6.45* 6.66* 8.57*  CALCIUM 8.5* 8.6* 8.7* 8.0*  PHOS  --  2.6  --   --     Liver Function Tests:  Recent Labs Lab 02/18/17 1821 02/18/17 2325 02/22/17 0310  AST 19  --  26  ALT 9*  --  9*  ALKPHOS 184*  --  193*  BILITOT 0.4  --  0.9  PROT 8.1  --  7.9  ALBUMIN 2.2* 2.2* 1.9*   No results for input(s): LIPASE, AMYLASE in the last 168 hours. No results for input(s): AMMONIA in the last 168 hours.  CBC:  Recent Labs Lab 02/18/17 1821 02/18/17 2325 02/19/17 0503 02/22/17 0310 02/23/17 0346  WBC 20.8* 19.4* 21.3* 17.1* 19.8*  NEUTROABS 17.6*  --  18.1*  --  15.5*  HGB 8.0* 7.8* 7.7* 6.9* 8.7*  HCT  26.6* 25.6* 25.9* 22.7* 27.8*  MCV 84.7 82.8 84.4 80.5 81.8  PLT 353 368 396 420* 508*    Cardiac Enzymes: No results for input(s): CKTOTAL, CKMB, CKMBINDEX, TROPONINI in the last 168 hours.  BNP: Invalid input(s): POCBNP  CBG:  Recent Labs Lab 02/22/17 0703 02/22/17 1217 02/22/17 1703 02/22/17 2220 02/23/17 0705  GLUCAP 74* 91 100* 126* 161*    Microbiology: Results for orders placed or performed during the hospital encounter of 02/18/17  Culture, blood (routine x 2)     Status: Abnormal   Collection Time: 02/18/17  6:21 PM  Result Value Ref Range Status   Specimen Description BLOOD LEFT HAND  Final   Special Requests IN PEDIATRIC BOTTLE Blood Culture adequate volume  Final   Culture  Setup Time   Final    GRAM POSITIVE COCCI IN CLUSTERS AEROBIC BOTTLE ONLY CRITICAL RESULT CALLED TO, READ BACK BY AND VERIFIED WITH: L. Huntley Dec.D. 15:30 02/19/17 (wilsonm)    Culture (A)  Final    STAPHYLOCOCCUS AUREUS SUSCEPTIBILITIES PERFORMED ON PREVIOUS CULTURE WITHIN THE LAST 5 DAYS.    Report Status 02/21/2017 FINAL  Final  Blood Culture ID Panel (Reflexed)  Status: Abnormal   Collection Time: 02/18/17  6:21 PM  Result Value Ref Range Status   Enterococcus species NOT DETECTED NOT DETECTED Final   Listeria monocytogenes NOT DETECTED NOT DETECTED Final   Staphylococcus species DETECTED (A) NOT DETECTED Final    Comment: CRITICAL RESULT CALLED TO, READ BACK BY AND VERIFIED WITH: L. Huntley Dec.D. 15:30 02/19/17 (wilsonm)    Staphylococcus aureus DETECTED (A) NOT DETECTED Final    Comment: Methicillin (oxacillin)-resistant Staphylococcus aureus (MRSA). MRSA is predictably resistant to beta-lactam antibiotics (except ceftaroline). Preferred therapy is vancomycin unless clinically contraindicated. Patient requires contact precautions if  hospitalized. CRITICAL RESULT CALLED TO, READ BACK BY AND VERIFIED WITH: L. Huntley Dec.D. 15:30 02/19/17 (wilsonm)    Methicillin  resistance DETECTED (A) NOT DETECTED Final    Comment: CRITICAL RESULT CALLED TO, READ BACK BY AND VERIFIED WITH: L. Huntley Dec.D. 15:30 02/19/17 (wilsonm)    Streptococcus species NOT DETECTED NOT DETECTED Final   Streptococcus agalactiae NOT DETECTED NOT DETECTED Final   Streptococcus pneumoniae NOT DETECTED NOT DETECTED Final   Streptococcus pyogenes NOT DETECTED NOT DETECTED Final   Acinetobacter baumannii NOT DETECTED NOT DETECTED Final   Enterobacteriaceae species NOT DETECTED NOT DETECTED Final   Enterobacter cloacae complex NOT DETECTED NOT DETECTED Final   Escherichia coli NOT DETECTED NOT DETECTED Final   Klebsiella oxytoca NOT DETECTED NOT DETECTED Final   Klebsiella pneumoniae NOT DETECTED NOT DETECTED Final   Proteus species NOT DETECTED NOT DETECTED Final   Serratia marcescens NOT DETECTED NOT DETECTED Final   Haemophilus influenzae NOT DETECTED NOT DETECTED Final   Neisseria meningitidis NOT DETECTED NOT DETECTED Final   Pseudomonas aeruginosa NOT DETECTED NOT DETECTED Final   Candida albicans NOT DETECTED NOT DETECTED Final   Candida glabrata NOT DETECTED NOT DETECTED Final   Candida krusei NOT DETECTED NOT DETECTED Final   Candida parapsilosis NOT DETECTED NOT DETECTED Final   Candida tropicalis NOT DETECTED NOT DETECTED Final  Culture, blood (routine x 2)     Status: Abnormal   Collection Time: 02/18/17  9:34 PM  Result Value Ref Range Status   Specimen Description BLOOD LEFT ARM  Final   Special Requests   Final    BOTTLES DRAWN AEROBIC AND ANAEROBIC Blood Culture adequate volume   Culture  Setup Time   Final    GRAM POSITIVE COCCI IN BOTH AEROBIC AND ANAEROBIC BOTTLES CRITICAL RESULT CALLED TO, READ BACK BY AND VERIFIED WITH: L. Huntley Dec.D. 15:30 02/19/17 (wilsonm)    Culture METHICILLIN RESISTANT STAPHYLOCOCCUS AUREUS (A)  Final   Report Status 02/21/2017 FINAL  Final   Organism ID, Bacteria METHICILLIN RESISTANT STAPHYLOCOCCUS AUREUS  Final       Susceptibility   Methicillin resistant staphylococcus aureus - MIC*    CIPROFLOXACIN >=8 RESISTANT Resistant     ERYTHROMYCIN >=8 RESISTANT Resistant     GENTAMICIN <=0.5 SENSITIVE Sensitive     OXACILLIN >=4 RESISTANT Resistant     TETRACYCLINE <=1 SENSITIVE Sensitive     VANCOMYCIN 1 SENSITIVE Sensitive     TRIMETH/SULFA <=10 SENSITIVE Sensitive     CLINDAMYCIN <=0.25 SENSITIVE Sensitive     RIFAMPIN <=0.5 SENSITIVE Sensitive     Inducible Clindamycin NEGATIVE Sensitive     * METHICILLIN RESISTANT STAPHYLOCOCCUS AUREUS  MRSA PCR Screening     Status: None   Collection Time: 02/18/17 11:22 PM  Result Value Ref Range Status   MRSA by PCR NEGATIVE NEGATIVE Final    Comment:  The GeneXpert MRSA Assay (FDA approved for NASAL specimens only), is one component of a comprehensive MRSA colonization surveillance program. It is not intended to diagnose MRSA infection nor to guide or monitor treatment for MRSA infections.   MRSA PCR Screening     Status: Abnormal   Collection Time: 02/18/17 11:22 PM  Result Value Ref Range Status   MRSA by PCR POSITIVE (A) NEGATIVE Final    Comment:        The GeneXpert MRSA Assay (FDA approved for NASAL specimens only), is one component of a comprehensive MRSA colonization surveillance program. It is not intended to diagnose MRSA infection nor to guide or monitor treatment for MRSA infections. RESULT CALLED TO, READ BACK BY AND VERIFIED WITH: DULLA,R RN 2353 02/19/17 MITCHELL,L   Culture, blood (routine x 2)     Status: None (Preliminary result)   Collection Time: 02/20/17  5:20 AM  Result Value Ref Range Status   Specimen Description BLOOD LEFT ANTECUBITAL  Final   Special Requests   Final    BOTTLES DRAWN AEROBIC AND ANAEROBIC Blood Culture adequate volume   Culture NO GROWTH 2 DAYS  Final   Report Status PENDING  Incomplete  Culture, blood (routine x 2)     Status: None (Preliminary result)   Collection Time: 02/20/17  5:21 AM   Result Value Ref Range Status   Specimen Description BLOOD LEFT HAND  Final   Special Requests   Final    BOTTLES DRAWN AEROBIC ONLY Blood Culture adequate volume   Culture NO GROWTH 2 DAYS  Final   Report Status PENDING  Incomplete    Coagulation Studies: No results for input(s): LABPROT, INR in the last 72 hours.  Urinalysis: No results for input(s): COLORURINE, LABSPEC, PHURINE, GLUCOSEU, HGBUR, BILIRUBINUR, KETONESUR, PROTEINUR, UROBILINOGEN, NITRITE, LEUKOCYTESUR in the last 72 hours.  Invalid input(s): APPERANCEUR    Imaging: No results found.   Medications:   . sodium chloride    . sodium chloride    . vancomycin Stopped (02/22/17 1334)   . aspirin EC  325 mg Oral Daily  . atorvastatin  40 mg Oral q1800  . carvedilol  6.25 mg Oral 2 times per day on Sun Tue Thu Sat  . carvedilol  6.25 mg Oral Once per day on Mon Wed Fri  . Chlorhexidine Gluconate Cloth  6 each Topical Q0600  . cinacalcet  30 mg Oral Q breakfast  . clopidogrel  75 mg Oral Daily  . darbepoetin (ARANESP) injection - DIALYSIS  60 mcg Intravenous Q Fri-HD  . doxercalciferol  1.5 mcg Intravenous Q M,W,F-HD  . feeding supplement (NEPRO CARB STEADY)  237 mL Oral Q1500  . feeding supplement (PRO-STAT SUGAR FREE 64)  30 mL Oral Daily  . finasteride  5 mg Oral Daily  . heparin  5,000 Units Subcutaneous Q8H  . insulin aspart  0-5 Units Subcutaneous QHS  . insulin aspart  0-9 Units Subcutaneous TID WC  . insulin glargine  15 Units Subcutaneous QHS  . multivitamin  1 tablet Oral Q M,W,F-2000  . mupirocin ointment  1 application Nasal BID  . pantoprazole  40 mg Oral Daily  . sevelamer carbonate  2,400 mg Oral TID WC  . sodium chloride flush  3 mL Intravenous Q12H   sodium chloride, acetaminophen, albuterol, bisacodyl, dextromethorphan, HYDROmorphone (DILAUDID) injection, loratadine, ondansetron **OR** ondansetron (ZOFRAN) IV, oxyCODONE-acetaminophen, sevelamer carbonate, sodium chloride flush  Assessment/  Plan:  1. ESRD MWF RUE AVF 2. S/p R TMA 01/01/17 with poor  healing and drainage, MRSA infection, on Vanc awaiting debridement +/- arteriogram 3. MRSA Bacteremia, neg TTE 02/20/17 no indication for TEE  4. Anemia; worsened with acute health issues; TSAT 9% and ferritin 909   Hb 8.7 5. Leukocytosis, neutrophilia 2/2 #2 6. DM2 7. PAD    LOS: 5 Jaquel Coomer W @TODAY @11 :13 AM

## 2017-02-23 NOTE — Care Management Note (Addendum)
Case Management Note  Patient Details  Name: Jacob [name] MRN: [id number] Date of Birth: [birth year]  Subjective/Objective:   From home alone, he lives in [place] close to his sister, Jacob [name], [place], who is his support system, s/p  Failed attempted angioplasty, right anterior tibial artery.  His sister, Jacob [name] states he will have amputation done on Friday to right LE.   He has had HH but can not remember the agency's name. He has a cane, walker and a 3 n 1 at home.                   Action/Plan: NCM will follow for dc needs.   Expected Discharge Date:                  Expected Discharge Plan:     In-House Referral:     Discharge planning Services  CM Consult  Post Acute Care Choice:    Choice offered to:     DME Arranged:    DME Agency:     HH Arranged:    HH Agency:     Status of Service:  In process, will continue to follow  If discussed at Long Length of Stay Meetings, dates discussed:    Additional Comments:  Zenon Mayo, RN 02/23/2017, 3:20 PM

## 2017-02-23 NOTE — Consult Note (Signed)
   [facility] Inpatient Consult   02/23/2017  AARUSH STUKEY April 14, 1951 [id number]    Patient is currently active with [facility] for chronic disease management services.  Patient has been engaged by a [employer] and CSW.  Our community based plan of care has focused on disease management and community resource support.  Patient will receive a post discharge transition of care call and will be evaluated for monthly home visits for assessments and disease process education.  Made Inpatient Case Manager, Neoma Laming, aware that [facility] following for ongoing post hospital needs.  Will follow for progression and disposition. . Of note, [facility] Care Management services does not replace or interfere with any services that are needed or arranged by inpatient case management or social work.  For additional questions or referrals please contact:   Natividad Brood, RN BSN [facility] Liaison  [phone] business mobile phone Toll free office [phone]

## 2017-02-23 NOTE — Progress Notes (Signed)
Patient states he is sure he has already had his long-acting insulin today.  Per MAR and report, he has not received this, only short-acting.  Held per patient request, but educated and read [facility] meds given to pt.  CBG 123 this evening and pt is eating late dinner.  Will continue to monitor.  Randell Patient

## 2017-02-23 NOTE — Progress Notes (Addendum)
Mickel Baas RN discussed with infection control nurse about pt having a positive mrsa swab and a negative mrsa swab in the computer. Infection control nurse stated that one swab was performed for each nare but that blood cultures are positive for mrsa and pt does have a history of mrsa in the past.   She stated to leave on contact precautions but pt has had 5 days of bactroban and the cloths and no need for those to cont after today. Will continue contact precautions and update oncoming staff.

## 2017-02-24 ENCOUNTER — Encounter (HOSPITAL_COMMUNITY): Payer: Self-pay | Admitting: Surgery

## 2017-02-24 DIAGNOSIS — Z66 Do not resuscitate: Secondary | ICD-10-CM | POA: Diagnosis present

## 2017-02-24 DIAGNOSIS — Z515 Encounter for palliative care: Secondary | ICD-10-CM

## 2017-02-24 LAB — CBC WITH DIFFERENTIAL/PLATELET
BLASTS: 0 %
Band Neutrophils: 0 %
Basophils Absolute: 0.2 10*3/uL — ABNORMAL HIGH (ref 0.0–0.1)
Basophils Relative: 1 %
Eosinophils Absolute: 0.5 10*3/uL (ref 0.0–0.7)
Eosinophils Relative: 3 %
HCT: 28.5 % — ABNORMAL LOW (ref 39.0–52.0)
HEMOGLOBIN: 8.8 g/dL — AB (ref 13.0–17.0)
LYMPHS PCT: 4 %
Lymphs Abs: 0.7 10*3/uL (ref 0.7–4.0)
MCH: 25.4 pg — AB (ref 26.0–34.0)
MCHC: 30.9 g/dL (ref 30.0–36.0)
MCV: 82.1 fL (ref 78.0–100.0)
MYELOCYTES: 0 %
Metamyelocytes Relative: 0 %
Monocytes Absolute: 1.3 10*3/uL — ABNORMAL HIGH (ref 0.1–1.0)
Monocytes Relative: 7 %
NEUTROS PCT: 85 %
Neutro Abs: 15.2 10*3/uL — ABNORMAL HIGH (ref 1.7–7.7)
OTHER: 0 %
PLATELETS: 535 10*3/uL — AB (ref 150–400)
PROMYELOCYTES ABS: 0 %
RBC: 3.47 MIL/uL — AB (ref 4.22–5.81)
RDW: 17.8 % — ABNORMAL HIGH (ref 11.5–15.5)
WBC: 17.9 10*3/uL — AB (ref 4.0–10.5)
nRBC: 1 /100 WBC — ABNORMAL HIGH

## 2017-02-24 LAB — RENAL FUNCTION PANEL
ANION GAP: 11 (ref 5–15)
Albumin: 1.9 g/dL — ABNORMAL LOW (ref 3.5–5.0)
BUN: 38 mg/dL — ABNORMAL HIGH (ref 6–20)
CO2: 25 mmol/L (ref 22–32)
CREATININE: 7.54 mg/dL — AB (ref 0.61–1.24)
Calcium: 8.1 mg/dL — ABNORMAL LOW (ref 8.9–10.3)
Chloride: 92 mmol/L — ABNORMAL LOW (ref 101–111)
GFR, EST AFRICAN AMERICAN: 8 mL/min — AB (ref >60)
GFR, EST NON AFRICAN AMERICAN: 7 mL/min — AB (ref >60)
Glucose, Bld: 149 mg/dL — ABNORMAL HIGH (ref 65–99)
Phosphorus: 3.5 mg/dL (ref 2.5–4.6)
Potassium: 3.9 mmol/L (ref 3.5–5.1)
Sodium: 128 mmol/L — ABNORMAL LOW (ref 135–145)

## 2017-02-24 LAB — GLUCOSE, CAPILLARY
GLUCOSE-CAPILLARY: 205 mg/dL — AB (ref 65–99)
GLUCOSE-CAPILLARY: 142 mg/dL — AB (ref 65–99)
Glucose-Capillary: 160 mg/dL — ABNORMAL HIGH (ref 65–99)
Glucose-Capillary: 166 mg/dL — ABNORMAL HIGH (ref 65–99)
Glucose-Capillary: 187 mg/dL — ABNORMAL HIGH (ref 65–99)

## 2017-02-24 LAB — CBC
HCT: 27.9 % — ABNORMAL LOW (ref 39.0–52.0)
HEMOGLOBIN: 8.7 g/dL — AB (ref 13.0–17.0)
MCH: 25.7 pg — AB (ref 26.0–34.0)
MCHC: 31.2 g/dL (ref 30.0–36.0)
MCV: 82.3 fL (ref 78.0–100.0)
PLATELETS: 482 10*3/uL — AB (ref 150–400)
RBC: 3.39 MIL/uL — AB (ref 4.22–5.81)
RDW: 17.8 % — ABNORMAL HIGH (ref 11.5–15.5)
WBC: 16.5 10*3/uL — AB (ref 4.0–10.5)

## 2017-02-24 MED ORDER — VANCOMYCIN HCL IN DEXTROSE 1-5 GM/200ML-% IV SOLN
INTRAVENOUS | Status: AC
  Administered 2017-02-24: 1000 mg via INTRAVENOUS
  Filled 2017-02-24: qty 200

## 2017-02-24 MED ORDER — HYDROMORPHONE HCL 1 MG/ML IJ SOLN
INTRAMUSCULAR | Status: AC
  Filled 2017-02-24: qty 0.5

## 2017-02-24 MED ORDER — HYDROCODONE-ACETAMINOPHEN 5-325 MG PO TABS
1.0000 | ORAL_TABLET | ORAL | Status: DC | PRN
  Administered 2017-02-24 – 2017-02-28 (×13): 1 via ORAL
  Filled 2017-02-24 (×12): qty 1

## 2017-02-24 MED ORDER — DOXERCALCIFEROL 4 MCG/2ML IV SOLN
INTRAVENOUS | Status: AC
  Administered 2017-02-24: 1.5 ug via INTRAVENOUS
  Filled 2017-02-24: qty 2

## 2017-02-24 MED ORDER — DIPHENHYDRAMINE HCL 25 MG PO CAPS
25.0000 mg | ORAL_CAPSULE | Freq: Once | ORAL | Status: AC
  Administered 2017-02-25: 25 mg via ORAL
  Filled 2017-02-24: qty 1

## 2017-02-24 NOTE — Consult Note (Signed)
Consultation Note Date: 02/24/2017   Patient Name: Jacob [name]  DOB: Jan 20, 1951  MRN: [id number]  Age / Sex: 66 y.o., male  PCP: Sharilyn Sites, MD Referring Physician: Janece Canterbury, MD  Reason for Consultation: Establishing goals of care .A palliative Medicine consult was placed due to refusal for dialysis today.  HPI/Patient Profile: Jacob [name] is a 66 y.o. male with medical history significant for end-stage renal disease on hemodialysis, peripheral arterial disease status post angioplasty, insulin-dependent diabetes mellitus, anemia, and right TMA on 01/01/2017 with wound dehiscence. He was admitted for pain and concern for infection; he was found to have MRSA bacteremia which Infectious Disease has been consulted for to assist with antibiotic therapy.  An arterial duplex was completed demonstrating occlusion. He was returned to the OR on 7/24 with Vascular Surgery for attempted angioplasty which was unsuccessful, with most recent  plans for a BKA Friday. He has been followed by Nephrology for his ESRD with hemodialysis.   Overall failure to thrive. In light of multiple comorbidities, patient has long term poor prognosis. Patient faces treatment dicisions, advance directive discisions, and anticipitory care needs.   Clinical Assessment and Goals of Care:   Jacob [name]  resting comfortably in bed. Upon clarification of the dialysis, he stated that he is interested in continuing dialysis, but he does not want it here. He states that with his home dialysis treatments, his pain is controlled, and the treatment is stopped if he requests.  He states that in his experiences here, he has been uncomfortable with a lot of pain that was untreated. Reluctant  to proceed with the surgery to his leg, that he wants to be finished with care. He is aware that this could lead to death. He verbalizes frustration with daily lab  work, bedside care, and the thought of needing amputation revisions moving "a little further, and a little further" and about "just laying here in the bed".   Jacob [name] states he has an ex-wife/Margie Lozito # [phone] with whom there is mutual love, and a sister. His daughter and grandchild both have died. He has friends at the dialysis center.   We discussed continuation of dialysis today provided his pain was controlled, and that if he asked to stop, the treatment would be discontinued, and he agrees to attempt dialysis again today.  He is agreeable to a family meeting with his ex-wife and sister, meeting is scheduled for tomorrow morning at 46;00 with Mrs Frazzini.       He does not want CPR or intubation if warranted--will document today   PATIENT-no documented HPOA or AD.    SUMMARY OF RECOMMENDATIONS     Code Status/Advance Care Planning:  DNR/DNI  Additional Recommendations (Limitations, Scope, Preferences):  Full Scope Treatment at this time.  Patient verbalizes reluctance for aggressive treatments.    Prognosis:  Unable to determine. High risk to decompensate and long term poor prognosis.   Discharge Planning:   To be determined      Primary Diagnoses: Present on  Admission: . Essential hypertension, benign . Normocytic anemia . Wound infection . Hyponatremia   I have reviewed the medical record, interviewed the patient and family, and examined the patient. The following aspects are pertinent.  Past Medical History:  Diagnosis Date  . Anemia   . Anemia in chronic kidney disease(285.21)   . Anxiety   . Arthritis   . Depression   . Dyspnea    with exertion  . ESRD (end stage renal disease) on dialysis [facility])    "MWF; [facility] [place]" (02/18/2017)  . Essential hypertension   . GERD (gastroesophageal reflux disease)   . Glaucoma   . History of blood transfusion   . Insomnia   . Nonischemic cardiomyopathy ([place])   . Stroke [facility]) 2016   - "they said it was not a  stroke"  . Type 2 diabetes mellitus (HCC)    Type II   Social History   Social History  . Marital status: Divorced    Spouse name: N/A  . Number of children: N/A  . Years of education: N/A   Social History Main Topics  . Smoking status: Never Smoker  . Smokeless tobacco: Never Used  . Alcohol use Yes     Comment: 1- fifth of gin a week  . Drug use: No  . Sexual activity: Yes    Birth control/ protection: None   Other Topics Concern  . None   Social History Narrative  . None   Family History  Problem Relation Age of Onset  . Heart attack Brother        54  . Heart disease Brother        before age 30  . Hypertension Brother   . Heart attack Brother        8  . Heart attack Brother        43  . Diabetes Mother   . Hypertension Mother   . Heart disease Mother   . Heart disease Father   . Hypertension Father   . Other Father        amputation  . Hypertension Sister   . Heart disease Sister   . Vision loss Maternal Uncle    Scheduled Meds: . aspirin EC  325 mg Oral Daily  . atorvastatin  40 mg Oral q1800  . [START ON 02/25/2017] carvedilol  12.5 mg Oral 2 times per day on Sun Tue Thu Sat  . cinacalcet  30 mg Oral Q breakfast  . clopidogrel  75 mg Oral Daily  . darbepoetin (ARANESP) injection - DIALYSIS  60 mcg Intravenous Q Fri-HD  . diphenhydrAMINE  25 mg Oral Once  . doxercalciferol  1.5 mcg Intravenous Q M,W,F-HD  . feeding supplement (NEPRO CARB STEADY)  237 mL Oral Q1500  . feeding supplement (PRO-STAT SUGAR FREE 64)  30 mL Oral Daily  . finasteride  5 mg Oral Daily  . heparin  5,000 Units Subcutaneous Q8H  . insulin aspart  0-5 Units Subcutaneous QHS  . insulin aspart  0-9 Units Subcutaneous TID WC  . insulin glargine  15 Units Subcutaneous QHS  . multivitamin  1 tablet Oral Q M,W,F-2000  . mupirocin ointment  1 application Nasal BID  . pantoprazole  40 mg Oral Daily  . sevelamer carbonate  2,400 mg Oral TID WC  . sodium chloride flush  3 mL  Intravenous Q12H   Continuous Infusions: . sodium chloride    . sodium chloride    . sodium chloride    .  sodium chloride Stopped (02/23/17 2300)  . vancomycin Stopped (02/22/17 1334)   PRN Meds:.sodium chloride, sodium chloride, sodium chloride, sodium chloride, acetaminophen, albuterol, alteplase, alum & mag hydroxide-simeth, bisacodyl, dextromethorphan, heparin, hydrALAZINE, HYDROcodone-acetaminophen, HYDROmorphone (DILAUDID) injection, labetalol, lidocaine (PF), lidocaine-prilocaine, loratadine, metoprolol tartrate, ondansetron **OR** ondansetron (ZOFRAN) IV, pentafluoroprop-tetrafluoroeth, phenol, sevelamer carbonate, sodium chloride flush Medications Prior to Admission:  Prior to Admission medications   Medication Sig Start Date End Date Taking? Authorizing Provider  acetaminophen (TYLENOL) 500 MG tablet Take 1,000 mg by mouth every 6 (six) hours as needed for mild pain.   Yes [provider]  aspirin EC 325 MG tablet Take 325 mg by mouth daily.   Yes [provider]  B Complex-C-Folic Acid (DIALYVITE TABLET) TABS Take 1 tablet by mouth every Monday, Wednesday, and Friday.    Yes [provider]  bisacodyl (DULCOLAX) 5 MG EC tablet Take 2 tablets (10 mg total) by mouth daily as needed for moderate constipation. 01/04/17  Yes Mikhail, Velta Addison, DO  cinacalcet (SENSIPAR) 30 MG tablet Take 30 mg by mouth daily.   Yes [provider]  dextromethorphan (ROBITUSSIN 12 HOUR COUGH) 30 MG/5ML liquid Take by mouth as needed for cough.   Yes [provider]  EPIPEN 2-PAK 0.3 MG/0.3ML SOAJ injection Inject 0.3 mg into the muscle once as needed (For anaphylaxis.).  04/18/13  Yes [provider]  LANTUS SOLOSTAR 100 UNIT/ML Solostar Pen Inject 20 Units into the skin at bedtime. If blood sugar is lower than 150 only inject 20 units at bedtime 01/15/15  Yes [provider]  lisinopril (PRINIVIL,ZESTRIL) 20 MG tablet Take 20 mg by mouth daily.   Yes  [provider]  loratadine (CLARITIN) 10 MG tablet Take 10 mg by mouth daily as needed for allergies.   Yes [provider]  omeprazole (PRILOSEC) 40 MG capsule Take 40 mg by mouth daily.    Yes [provider]  ramipril (ALTACE) 10 MG capsule Take 10 mg by mouth daily.    Yes [provider]  RENVELA 800 MG tablet Take 800-2,400 mg by mouth 3 (three) times daily with meals. Take 2400 mg by mouth 3 times daily with meals and 800 mg by mouth with snacks 05/25/13  Yes [provider]  clopidogrel (PLAVIX) 75 MG tablet Take 1 tablet (75 mg total) by mouth daily with breakfast. Patient not taking: Reported on 02/18/2017 01/05/17   Cristal Ford, DO  divalproex (DEPAKOTE) 500 MG DR tablet Take 1 tablet (500 mg total) by mouth every 12 (twelve) hours. Patient not taking: Reported on 02/18/2017 01/04/17   Cristal Ford, DO  oxyCODONE-acetaminophen (PERCOCET/ROXICET) 5-325 MG tablet Take 1 tablet by mouth every 4 (four) hours as needed for moderate pain. Patient not taking: Reported on 02/18/2017 01/04/17   Cristal Ford, DO   Allergies  Allergen Reactions  . Bee Venom Anaphylaxis  . Penicillins Swelling and Other (See Comments)    SWELLING REACTION UNSPECIFIED   Has patient had a PCN reaction causing immediate rash, facial/tongue/throat swelling, SOB or lightheadedness with hypotension: No Has patient had a PCN reaction causing severe rash involving mucus membranes or skin necrosis: No Has patient had a PCN reaction that required hospitalization No Has patient had a PCN reaction occurring within the last 10 years: No If all of the above answers are "NO", then may proceed with Cephalosporin use.  . Iodine Swelling  . Sulfadiazine Other (See Comments)    1% Silver Sulfadiazine cream causes burning over a large area  of skin.   Review of Systems  Constitutional: Negative.   HENT: Negative.   Eyes: Negative.   Cardiovascular: Negative.     Gastrointestinal: Negative.   Endocrine: Negative.   Genitourinary: Negative.   Musculoskeletal: Negative.   Skin: Negative.   Allergic/Immunologic: Negative.   Hematological: Negative.   Psychiatric/Behavioral: Negative.   Right hip pain.  Physical Exam  Constitutional: He is oriented to person, place, and time. He appears well-developed.  HENT:  Head: Atraumatic.  Eyes: Right eye exhibits no discharge. Left eye exhibits no discharge.  Cardiovascular:  Warm and dry  Pulmonary/Chest: Effort normal. No stridor.  Abdominal: He exhibits no distension.  Musculoskeletal: Normal range of motion.  Neurological: He is alert and oriented to person, place, and time.  Skin: Skin is warm and dry.    Vital Signs: BP (!) 125/54 (BP Location: Left Arm)   Pulse 75   Temp 97.6 F (36.4 C) (Oral)   Resp 19   Ht 6\' 2"  (1.88 m)   Wt 125.3 kg (276 lb 3.8 oz)   SpO2 96%   BMI 35.47 kg/m  Pain Assessment: No/denies pain POSS *See Group Information*: S-Acceptable,Sleep, easy to arouse Pain Score: Asleep   SpO2: SpO2: 96 % O2 Device:SpO2: 96 % O2 Flow Rate: .   IO: Intake/output summary:  Intake/Output Summary (Last 24 hours) at 02/24/17 1158 Last data filed at 02/24/17 0600  Gross per 24 hour  Intake            270.5 ml  Output                0 ml  Net            270.5 ml    LBM: Last BM Date: 02/19/17 Baseline Weight: Weight: 128.4 kg (283 lb) Most recent weight: Weight: 125.3 kg (276 lb 3.8 oz)      Palliative Assessment/Data: 30 % at best   Flowsheet Rows     Most Recent Value  Intake Tab  Referral Department  Hospitalist  Unit at Time of Referral  Med/Surg Unit  Palliative Care Primary Diagnosis  Sepsis/Infectious Disease  Date Notified  02/24/17  Palliative Care Type  New Palliative care  Reason for referral  Clarify Goals of Care  Date of Admission  02/18/17  Date first seen by Palliative Care  02/24/17  # of days Palliative referral response time  0 Day(s)  #  of days IP prior to Palliative referral  6  Clinical Assessment  Palliative Performance Scale Score  30%  Psychosocial & Spiritual Assessment  Palliative Care Outcomes     Discussed with Dr Sheran Fava   Time In: 63 Time Out: 1200 Time Total: 75 min Greater than 50%  of this time was spent counseling and coordinating care related to the above assessment and plan.  Signed by: Wadie Lessen, NP   Please contact Palliative Medicine Team phone at [phone] for questions and concerns.  For individual provider: See Shea Evans

## 2017-02-24 NOTE — Progress Notes (Signed)
Transporting in bed to dialysis; pain medication given before transport.

## 2017-02-24 NOTE — Progress Notes (Signed)
[facility] ROUNDING NOTE   Subjective:   Interval History:34M ESRD MWF RUE AVF [facility] with R TMA site infection and poor healing  Planned for BKA Friday   Objective:  Vital signs in last 24 hours:  Temp:  [97.6 F (36.4 C)-98.6 F (37 C)] 97.6 F (36.4 C) (07/25 0534) Pulse Rate:  [0-79] 75 (07/25 0800) Resp:  [0-54] 19 (07/25 0800) BP: (97-169)/(42-83) 125/54 (07/25 0800) SpO2:  [0 %-100 %] 96 % (07/25 0800) Weight:  [276 lb 3.8 oz (125.3 kg)] 276 lb 3.8 oz (125.3 kg) (07/25 0352)  Weight change: -7 lb 0.9 oz (-3.2 kg) Filed Weights   02/22/17 0745 02/23/17 0700 02/24/17 0352  Weight: 283 lb 4.7 oz (128.5 kg) 270 lb 8.1 oz (122.7 kg) 276 lb 3.8 oz (125.3 kg)    Intake/Output: I/O last 3 completed shifts: In: 270.5 [P.O.:240; I.V.:30.5] Out: -    Intake/Output this shift:  No intake/output data recorded.  GEN: NAD, well appearing ENT: NCAT EYES: EOMI CV: RRR, no rub, nl s1s2 PULM: CTAB, nl wob ABD: s/t/nd' +BS SKIN: RLE bandaged, not examined, no rashes elsewhwere EXT:No LEE AVF RUE : +B/T   Basic Metabolic Panel:  Recent Labs Lab 02/18/17 1821 02/18/17 2325 02/19/17 0503 02/22/17 0310 02/24/17 0002  NA 131* 130* 134* 130* 128*  K 3.8 3.6 3.8 3.6 3.9  CL 94* 92* 94* 92* 92*  CO2 25 26 27 26 25   GLUCOSE 175* 174* 152* 71 149*  BUN 24* 25* 27* 47* 38*  CREATININE 6.23* 6.45* 6.66* 8.57* 7.54*  CALCIUM 8.5* 8.6* 8.7* 8.0* 8.1*  PHOS  --  2.6  --   --  3.5    Liver Function Tests:  Recent Labs Lab 02/18/17 1821 02/18/17 2325 02/22/17 0310 02/24/17 0002  AST 19  --  26  --   ALT 9*  --  9*  --   ALKPHOS 184*  --  193*  --   BILITOT 0.4  --  0.9  --   PROT 8.1  --  7.9  --   ALBUMIN 2.2* 2.2* 1.9* 1.9*   No results for input(s): LIPASE, AMYLASE in the last 168 hours. No results for input(s): AMMONIA in the last 168 hours.  CBC:  Recent Labs Lab 02/18/17 1821  02/19/17 0503 02/22/17 0310 02/23/17 0346  02/24/17 0002 02/24/17 0449  WBC 20.8*  < > 21.3* 17.1* 19.8* 16.5* 17.9*  NEUTROABS 17.6*  --  18.1*  --  15.5*  --  15.2*  HGB 8.0*  < > 7.7* 6.9* 8.7* 8.7* 8.8*  HCT 26.6*  < > 25.9* 22.7* 27.8* 27.9* 28.5*  MCV 84.7  < > 84.4 80.5 81.8 82.3 82.1  PLT 353  < > 396 420* 508* 482* 535*  < > = values in this interval not displayed.  Cardiac Enzymes: No results for input(s): CKTOTAL, CKMB, CKMBINDEX, TROPONINI in the last 168 hours.  BNP: Invalid input(s): POCBNP  CBG:  Recent Labs Lab 02/23/17 1112 02/23/17 1457 02/23/17 1746 02/24/17 0039 02/24/17 0651  GLUCAP 155* 117* 123* 160* 63*    Microbiology: Results for orders placed or performed during the hospital encounter of 02/18/17  Culture, blood (routine x 2)     Status: Abnormal   Collection Time: 02/18/17  6:21 PM  Result Value Ref Range Status   Specimen Description BLOOD LEFT HAND  Final   Special Requests IN PEDIATRIC BOTTLE Blood Culture adequate volume  Final   Culture  Setup Time  Final    GRAM POSITIVE COCCI IN CLUSTERS AEROBIC BOTTLE ONLY CRITICAL RESULT CALLED TO, READ BACK BY AND VERIFIED WITH: L. Huntley Dec.D. 15:30 02/19/17 (wilsonm)    Culture (A)  Final    STAPHYLOCOCCUS AUREUS SUSCEPTIBILITIES PERFORMED ON PREVIOUS CULTURE WITHIN THE LAST 5 DAYS.    Report Status 02/21/2017 FINAL  Final  Blood Culture ID Panel (Reflexed)     Status: Abnormal   Collection Time: 02/18/17  6:21 PM  Result Value Ref Range Status   Enterococcus species NOT DETECTED NOT DETECTED Final   Listeria monocytogenes NOT DETECTED NOT DETECTED Final   Staphylococcus species DETECTED (A) NOT DETECTED Final    Comment: CRITICAL RESULT CALLED TO, READ BACK BY AND VERIFIED WITH: L. Huntley Dec.D. 15:30 02/19/17 (wilsonm)    Staphylococcus aureus DETECTED (A) NOT DETECTED Final    Comment: Methicillin (oxacillin)-resistant Staphylococcus aureus (MRSA). MRSA is predictably resistant to beta-lactam antibiotics (except  ceftaroline). Preferred therapy is vancomycin unless clinically contraindicated. Patient requires contact precautions if  hospitalized. CRITICAL RESULT CALLED TO, READ BACK BY AND VERIFIED WITH: L. Huntley Dec.D. 15:30 02/19/17 (wilsonm)    Methicillin resistance DETECTED (A) NOT DETECTED Final    Comment: CRITICAL RESULT CALLED TO, READ BACK BY AND VERIFIED WITH: L. Huntley Dec.D. 15:30 02/19/17 (wilsonm)    Streptococcus species NOT DETECTED NOT DETECTED Final   Streptococcus agalactiae NOT DETECTED NOT DETECTED Final   Streptococcus pneumoniae NOT DETECTED NOT DETECTED Final   Streptococcus pyogenes NOT DETECTED NOT DETECTED Final   Acinetobacter baumannii NOT DETECTED NOT DETECTED Final   Enterobacteriaceae species NOT DETECTED NOT DETECTED Final   Enterobacter cloacae complex NOT DETECTED NOT DETECTED Final   Escherichia coli NOT DETECTED NOT DETECTED Final   Klebsiella oxytoca NOT DETECTED NOT DETECTED Final   Klebsiella pneumoniae NOT DETECTED NOT DETECTED Final   Proteus species NOT DETECTED NOT DETECTED Final   Serratia marcescens NOT DETECTED NOT DETECTED Final   Haemophilus influenzae NOT DETECTED NOT DETECTED Final   Neisseria meningitidis NOT DETECTED NOT DETECTED Final   Pseudomonas aeruginosa NOT DETECTED NOT DETECTED Final   Candida albicans NOT DETECTED NOT DETECTED Final   Candida glabrata NOT DETECTED NOT DETECTED Final   Candida krusei NOT DETECTED NOT DETECTED Final   Candida parapsilosis NOT DETECTED NOT DETECTED Final   Candida tropicalis NOT DETECTED NOT DETECTED Final  Culture, blood (routine x 2)     Status: Abnormal   Collection Time: 02/18/17  9:34 PM  Result Value Ref Range Status   Specimen Description BLOOD LEFT ARM  Final   Special Requests   Final    BOTTLES DRAWN AEROBIC AND ANAEROBIC Blood Culture adequate volume   Culture  Setup Time   Final    GRAM POSITIVE COCCI IN BOTH AEROBIC AND ANAEROBIC BOTTLES CRITICAL RESULT CALLED TO, READ BACK BY  AND VERIFIED WITH: L. Huntley Dec.D. 15:30 02/19/17 ([email or web address])    Culture METHICILLIN RESISTANT STAPHYLOCOCCUS AUREUS (A)  Final   Report Status 02/21/2017 FINAL  Final   Organism ID, Bacteria METHICILLIN RESISTANT STAPHYLOCOCCUS AUREUS  Final      Susceptibility   Methicillin resistant staphylococcus aureus - MIC*    CIPROFLOXACIN >=8 RESISTANT Resistant     ERYTHROMYCIN >=8 RESISTANT Resistant     GENTAMICIN <=0.5 SENSITIVE Sensitive     OXACILLIN >=4 RESISTANT Resistant     TETRACYCLINE <=1 SENSITIVE Sensitive     VANCOMYCIN 1 SENSITIVE Sensitive     TRIMETH/SULFA <=10 SENSITIVE Sensitive     CLINDAMYCIN <=0.25  SENSITIVE Sensitive     RIFAMPIN <=0.5 SENSITIVE Sensitive     Inducible Clindamycin NEGATIVE Sensitive     * METHICILLIN RESISTANT STAPHYLOCOCCUS AUREUS  MRSA PCR Screening     Status: None   Collection Time: 02/18/17 11:22 PM  Result Value Ref Range Status   MRSA by PCR NEGATIVE NEGATIVE Final    Comment:        The GeneXpert MRSA Assay (FDA approved for NASAL specimens only), is one component of a comprehensive MRSA colonization surveillance program. It is not intended to diagnose MRSA infection nor to guide or monitor treatment for MRSA infections.   MRSA PCR Screening     Status: Abnormal   Collection Time: 02/18/17 11:22 PM  Result Value Ref Range Status   MRSA by PCR POSITIVE (A) NEGATIVE Final    Comment:        The GeneXpert MRSA Assay (FDA approved for NASAL specimens only), is one component of a comprehensive MRSA colonization surveillance program. It is not intended to diagnose MRSA infection nor to guide or monitor treatment for MRSA infections. RESULT CALLED TO, READ BACK BY AND VERIFIED WITH: DULLA,R RN 2353 02/19/17 MITCHELL,L   Culture, blood (routine x 2)     Status: None (Preliminary result)   Collection Time: 02/20/17  5:20 AM  Result Value Ref Range Status   Specimen Description BLOOD LEFT ANTECUBITAL  Final   Special Requests   Final     BOTTLES DRAWN AEROBIC AND ANAEROBIC Blood Culture adequate volume   Culture NO GROWTH 4 DAYS  Final   Report Status PENDING  Incomplete  Culture, blood (routine x 2)     Status: None (Preliminary result)   Collection Time: 02/20/17  5:21 AM  Result Value Ref Range Status   Specimen Description BLOOD LEFT HAND  Final   Special Requests   Final    BOTTLES DRAWN AEROBIC ONLY Blood Culture adequate volume   Culture NO GROWTH 4 DAYS  Final   Report Status PENDING  Incomplete    Coagulation Studies: No results for input(s): LABPROT, INR in the last 72 hours.  Urinalysis: No results for input(s): COLORURINE, LABSPEC, PHURINE, GLUCOSEU, HGBUR, BILIRUBINUR, KETONESUR, PROTEINUR, UROBILINOGEN, NITRITE, LEUKOCYTESUR in the last 72 hours.  Invalid input(s): APPERANCEUR    Imaging: No results found.   Medications:   . sodium chloride    . sodium chloride    . sodium chloride    . sodium chloride Stopped (02/23/17 2300)  . vancomycin Stopped (02/22/17 1334)   . aspirin EC  325 mg Oral Daily  . atorvastatin  40 mg Oral q1800  . [START ON 02/25/2017] carvedilol  12.5 mg Oral 2 times per day on Sun Tue Thu Sat  . cinacalcet  30 mg Oral Q breakfast  . clopidogrel  75 mg Oral Daily  . darbepoetin (ARANESP) injection - DIALYSIS  60 mcg Intravenous Q Fri-HD  . diphenhydrAMINE  25 mg Oral Once  . doxercalciferol  1.5 mcg Intravenous Q M,W,F-HD  . feeding supplement (NEPRO CARB STEADY)  237 mL Oral Q1500  . feeding supplement (PRO-STAT SUGAR FREE 64)  30 mL Oral Daily  . finasteride  5 mg Oral Daily  . heparin  5,000 Units Subcutaneous Q8H  . insulin aspart  0-5 Units Subcutaneous QHS  . insulin aspart  0-9 Units Subcutaneous TID WC  . insulin glargine  15 Units Subcutaneous QHS  . multivitamin  1 tablet Oral Q M,W,F-2000  . mupirocin ointment  1 application Nasal  BID  . pantoprazole  40 mg Oral Daily  . sevelamer carbonate  2,400 mg Oral TID WC  . sodium chloride flush  3 mL  Intravenous Q12H   sodium chloride, sodium chloride, sodium chloride, sodium chloride, acetaminophen, albuterol, alteplase, alum & mag hydroxide-simeth, bisacodyl, dextromethorphan, heparin, hydrALAZINE, HYDROcodone-acetaminophen, HYDROmorphone (DILAUDID) injection, labetalol, lidocaine (PF), lidocaine-prilocaine, loratadine, metoprolol tartrate, ondansetron **OR** ondansetron (ZOFRAN) IV, pentafluoroprop-tetrafluoroeth, phenol, sevelamer carbonate, sodium chloride flush  Assessment/ Plan:  1. ESRD MWF RUE AVF 2. S/p R TMA 01/01/17 with poor healing and drainage, MRSA infection,  Plan BKA Friday 3. MRSA Bacteremia, neg TTE 02/20/17 no indication for TEE  4. Anemia; worsened with acute health issues; TSAT 9% and ferritin 909   Hb 8.7 5. Leukocytosis, neutrophilia 2/2 #2 6. DM2 7. PAD    LOS: 6 Victoria Henshaw W @TODAY @11 :42 AM

## 2017-02-24 NOTE — Progress Notes (Addendum)
PROGRESS NOTE  Jacob [name]  ZTI:[id number] DOB: May 04, 1951 DOA: 02/18/2017 PCP: Sharilyn Sites, MD  Brief Narrative:   The patient is a 66 year old male resident of the [facility] with diabetes mellitus, ESRD, hypertension, previous CVA, peripheral arterial disease who presented with a wound infection.  He recently underwent revascularization of the right lower extremity and had a transmetatarsal amputation performed on 01/01/2017 of the right foot. He was seen at the vascular office on July 17 and was found to have poor wound healing. He came to the emergency department on 7/19 with wound dehiscence and a marked leukocytosis. Vascular surgery was consulted.  The patient underwent angiography on 7/24. Unfortunately, the vascular surgeon was unable to perform angioplasty of the right anterior tibial artery and he had no surgical targets. Dr. Trula Slade recommended a right BKA for source control as the patient has been found to have MRSA bacteremia.  Today during my visit, the patient was frustrated with his overall hospitalization experience and was requesting that he be discharged home with no further treatments. We discussed that without removal of the infected part of his foot, he was unlikely to recover from his MRSA infection and he would likely die. I have requested a palliative care consult to discuss goals of care with the patient. I suspect that his frustration with the failed angioplasty yesterday and with what he considers to be mismanagement of his foot are causing him to be reluctant to pursue any further treatments. Today, despite reminding him that I was his doctor and he could discuss his concerns with me, he just kept reiterating his frustration that nobody talked to him.      Assessment & Plan:   Principal Problem:   Wound infection Active Problems:   End-stage renal disease on hemodialysis (HCC)   Essential hypertension, benign   Insulin-requiring or dependent type II diabetes  mellitus (HCC)   Normocytic anemia   Hyponatremia   Right foot infection  Infected right nonhealing transmetatarsal amputation wound with dehiscence, likely the source of his MRSA bacteremia.  - Strongly encouraged the patient to undergo right BKA - Continue vancomycin -  Appreciate vascular surgery assistance -  Would not remove the patient from his scheduled by for amputation on Friday because I believe he may change his mind about surgery once he feels he has adequately expressed his overall frustrations. -  Palliative care consultation  MRSA bacteremia, likely secondary to his forefoot infection -  MRI in 7/20 suggested possible discitis, however his back pain has improved and he will already be receiving a long course of vancomycin -  Continue vancomycin, will need a 4 week course starting on the date of his first negative blood culture were starting Friday after removal of his infected foot, which ever comes later -  ECHO:  No evidence of endocarditis -  ID consult appreciated  Toxic metabolic encephalopathy secondary to sepsis and sedating medications, resolved  End-stage renal disease, normally a Monday Wednesday Friday schedule.  Hyponatremia and secondary hyperparathyroidism related. -  Appreciate nephrology assistance  Acute blood loss anemia superimposed on anemia of chronic disease -  Transfused 2 units of pack red blood cells on 7/23 -  Receiving iron and EPO per nephrology  History of CVA, stable, continue Plavix and aspirin  Nonischemic cardiomyopathy, EF was 55-60 percent on 03/07/2015, volume control at dialysis  Hypertension with intermittent hypotension -  Continue increased Coreg -  Consider resuming lisinopril if his blood pressure remained stable during hemodialysis  Possible seizure disorder, bipolar disorder. Patient was previously on Depakote but had not taking the medication in 2 months. Currently being held.  Diabetes mellitus type 2 with peripheral  vascular palpitations -  Continue Lantus 15 units daily at bedtime -  Continue sliding scale insulin  DVT prophylaxis:  lovenox Code Status:  DNR Family Communication:  Patient alone Disposition Plan:  Pending possible surgery on 7/27.  Awaiting palliative care consultation.   Consultants:   Nephrology  Dr. Trula Slade, VVS  Infectious disease, Dr. Novella Olive  Procedures:   7/24:  Aortogram with RLE run-off and failed attempt at angioplasty of the RLE on 7/24   Aortogram:  No significant renal artery stenosis.  No infrarenal aortic stenosis.  Bilateral iliac arteries are widely patent             Right Lower Extremity:  The right common femoral profunda femoral and superficial femoral artery are patent without stenosis.  The popliteal artery is patent throughout it's course.  There is mild luminal irregularity at the curve of the anterior tibial artery.  Anterior tibial is patent down to just above the ankle where it occludes.  The posterior tibial and peroneal arteries are occluded             Left Lower Extremity:  Not evaluated   Antimicrobials:  Anti-infectives    Start     Dose/Rate Route Frequency Ordered Stop   02/24/17 1516  vancomycin (VANCOCIN) 1-5 GM/200ML-% IVPB    Comments:  Ashley Akin   : cabinet override      02/24/17 1516 02/25/17 0329   02/22/17 0928  vancomycin (VANCOCIN) 1-5 GM/200ML-% IVPB    Comments:  [name]   : cabinet override      02/22/17 0928 02/22/17 1119   02/19/17 1200  vancomycin (VANCOCIN) IVPB 1000 mg/200 mL premix     1,000 mg 200 mL/hr over 60 Minutes Intravenous Every M-W-F (Hemodialysis) 02/18/17 2109     02/18/17 2200  cefTRIAXone (ROCEPHIN) 2 g in dextrose 5 % 50 mL IVPB  Status:  Discontinued     2 g 100 mL/hr over 30 Minutes Intravenous Every 24 hours 02/18/17 2158 02/19/17 1854   02/18/17 2200  metroNIDAZOLE (FLAGYL) IVPB 500 mg  Status:  Discontinued     500 mg 100 mL/hr over 60 Minutes Intravenous Every 8 hours 02/18/17 2158  02/19/17 1854   02/18/17 2115  clindamycin (CLEOCIN) IVPB 600 mg     600 mg 100 mL/hr over 30 Minutes Intravenous  Once 02/18/17 2101 02/18/17 2228   02/18/17 2115  ciprofloxacin (CIPRO) IVPB 400 mg     400 mg 200 mL/hr over 60 Minutes Intravenous  Once 02/18/17 2101 02/18/17 2327   02/18/17 2115  vancomycin (VANCOCIN) 2,000 mg in sodium chloride 0.9 % 500 mL IVPB     2,000 mg 250 mL/hr over 120 Minutes Intravenous  Once 02/18/17 2104 02/19/17 0149       Subjective:  Having severe pain of the right foot intermittently. Asking for IV pain medication. He does not have any surgeries. He is frustrated because it is the hospital's fault that the wrong medication was applied to his foot twice and that is the reason why his foot is infected and the wound is dehisced and nonhealing. He does not think that the poor circulation to his foot is contributing to his problem at all. He states that the treatment that he is received in the hospital is "worse and death" and that nobody listens  to him. He states no doctors have discussed anything with him.  He does not want to have an amputation and wants to go home.    Objective: Vitals:   02/24/17 1415 02/24/17 1430 02/24/17 1519 02/24/17 1530  BP: (!) 149/73 (!) 156/70 (!) 162/78 138/75  Pulse: 70 75 80 77  Resp:      Temp:      TempSrc:      SpO2:      Weight:      Height:        Intake/Output Summary (Last 24 hours) at 02/24/17 1600 Last data filed at 02/24/17 1100  Gross per 24 hour  Intake            510.5 ml  Output                0 ml  Net            510.5 ml   Filed Weights   02/23/17 0700 02/24/17 0352 02/24/17 1300  Weight: 122.7 kg (270 lb 8.1 oz) 125.3 kg (276 lb 3.8 oz) 124.9 kg (275 lb 5.7 oz)    Examination:  General exam:  Adult male.  No acute distress.  Sitting in chair HEENT:  NCAT, MMM Respiratory system: Clear to auscultation bilaterally Cardiovascular system: Regular rate and rhythm, normal S1/S2. No murmurs,  rubs, gallops or clicks.  Warm extremities Gastrointestinal system: Normal active bowel sounds, soft, nondistended, nontender. MSK:  Normal tone and bulk, right foot bandage not removed for examination today.   Neuro:  Grossly moves all extremities    Data Reviewed: I have personally reviewed following labs and imaging studies  CBC:  Recent Labs Lab 02/18/17 1821  02/19/17 0503 02/22/17 0310 02/23/17 0346 02/24/17 0002 02/24/17 0449  WBC 20.8*  < > 21.3* 17.1* 19.8* 16.5* 17.9*  NEUTROABS 17.6*  --  18.1*  --  15.5*  --  15.2*  HGB 8.0*  < > 7.7* 6.9* 8.7* 8.7* 8.8*  HCT 26.6*  < > 25.9* 22.7* 27.8* 27.9* 28.5*  MCV 84.7  < > 84.4 80.5 81.8 82.3 82.1  PLT 353  < > 396 420* 508* 482* 535*  < > = values in this interval not displayed. Basic Metabolic Panel:  Recent Labs Lab 02/18/17 1821 02/18/17 2325 02/19/17 0503 02/22/17 0310 02/24/17 0002  NA 131* 130* 134* 130* 128*  K 3.8 3.6 3.8 3.6 3.9  CL 94* 92* 94* 92* 92*  CO2 25 26 27 26 25   GLUCOSE 175* 174* 152* 71 149*  BUN 24* 25* 27* 47* 38*  CREATININE 6.23* 6.45* 6.66* 8.57* 7.54*  CALCIUM 8.5* 8.6* 8.7* 8.0* 8.1*  PHOS  --  2.6  --   --  3.5   GFR: Estimated Creatinine Clearance: 13.4 mL/min (A) (by C-G formula based on SCr of 7.54 mg/dL (H)). Liver Function Tests:  Recent Labs Lab 02/18/17 1821 02/18/17 2325 02/22/17 0310 02/24/17 0002  AST 19  --  26  --   ALT 9*  --  9*  --   ALKPHOS 184*  --  193*  --   BILITOT 0.4  --  0.9  --   PROT 8.1  --  7.9  --   ALBUMIN 2.2* 2.2* 1.9* 1.9*   No results for input(s): LIPASE, AMYLASE in the last 168 hours. No results for input(s): AMMONIA in the last 168 hours. Coagulation Profile: No results for input(s): INR, PROTIME in the last 168 hours. Cardiac Enzymes: No results  for input(s): CKTOTAL, CKMB, CKMBINDEX, TROPONINI in the last 168 hours. BNP (last 3 results) No results for input(s): PROBNP in the last 8760 hours. HbA1C: No results for input(s):  HGBA1C in the last 72 hours. CBG:  Recent Labs Lab 02/23/17 1457 02/23/17 1746 02/24/17 0039 02/24/17 0651 02/24/17 1128  GLUCAP 117* 123* 160* 187* 205*   Lipid Profile: No results for input(s): CHOL, HDL, LDLCALC, TRIG, CHOLHDL, LDLDIRECT in the last 72 hours. Thyroid Function Tests: No results for input(s): TSH, T4TOTAL, FREET4, T3FREE, THYROIDAB in the last 72 hours. Anemia Panel: No results for input(s): VITAMINB12, FOLATE, FERRITIN, TIBC, IRON, RETICCTPCT in the last 72 hours. Urine analysis: No results found for: COLORURINE, APPEARANCEUR, LABSPEC, PHURINE, GLUCOSEU, HGBUR, BILIRUBINUR, KETONESUR, PROTEINUR, UROBILINOGEN, NITRITE, LEUKOCYTESUR Sepsis Labs: @LABRCNTIP (procalcitonin:4,lacticidven:4)  ) Recent Results (from the past 240 hour(s))  Culture, blood (routine x 2)     Status: Abnormal   Collection Time: 02/18/17  6:21 PM  Result Value Ref Range Status   Specimen Description BLOOD LEFT HAND  Final   Special Requests IN PEDIATRIC BOTTLE Blood Culture adequate volume  Final   Culture  Setup Time   Final    GRAM POSITIVE COCCI IN CLUSTERS AEROBIC BOTTLE ONLY CRITICAL RESULT CALLED TO, READ BACK BY AND VERIFIED WITH: L. Huntley Dec.D. 15:30 02/19/17 ([email or web address])    Culture (A)  Final    STAPHYLOCOCCUS AUREUS SUSCEPTIBILITIES PERFORMED ON PREVIOUS CULTURE WITHIN THE LAST 5 DAYS.    Report Status 02/21/2017 FINAL  Final  Blood Culture ID Panel (Reflexed)     Status: Abnormal   Collection Time: 02/18/17  6:21 PM  Result Value Ref Range Status   Enterococcus species NOT DETECTED NOT DETECTED Final   Listeria monocytogenes NOT DETECTED NOT DETECTED Final   Staphylococcus species DETECTED (A) NOT DETECTED Final    Comment: CRITICAL RESULT CALLED TO, READ BACK BY AND VERIFIED WITH: L. Huntley Dec.D. 15:30 02/19/17 ([email or web address])    Staphylococcus aureus DETECTED (A) NOT DETECTED Final    Comment: Methicillin (oxacillin)-resistant Staphylococcus aureus (MRSA). MRSA is  predictably resistant to beta-lactam antibiotics (except ceftaroline). Preferred therapy is vancomycin unless clinically contraindicated. Patient requires contact precautions if  hospitalized. CRITICAL RESULT CALLED TO, READ BACK BY AND VERIFIED WITH: L. Huntley Dec.D. 15:30 02/19/17 ([email or web address])    Methicillin resistance DETECTED (A) NOT DETECTED Final    Comment: CRITICAL RESULT CALLED TO, READ BACK BY AND VERIFIED WITH: L. Huntley Dec.D. 15:30 02/19/17 ([email or web address])    Streptococcus species NOT DETECTED NOT DETECTED Final   Streptococcus agalactiae NOT DETECTED NOT DETECTED Final   Streptococcus pneumoniae NOT DETECTED NOT DETECTED Final   Streptococcus pyogenes NOT DETECTED NOT DETECTED Final   Acinetobacter baumannii NOT DETECTED NOT DETECTED Final   Enterobacteriaceae species NOT DETECTED NOT DETECTED Final   Enterobacter cloacae complex NOT DETECTED NOT DETECTED Final   Escherichia coli NOT DETECTED NOT DETECTED Final   Klebsiella oxytoca NOT DETECTED NOT DETECTED Final   Klebsiella pneumoniae NOT DETECTED NOT DETECTED Final   Proteus species NOT DETECTED NOT DETECTED Final   Serratia marcescens NOT DETECTED NOT DETECTED Final   Haemophilus influenzae NOT DETECTED NOT DETECTED Final   Neisseria meningitidis NOT DETECTED NOT DETECTED Final   Pseudomonas aeruginosa NOT DETECTED NOT DETECTED Final   Candida albicans NOT DETECTED NOT DETECTED Final   Candida glabrata NOT DETECTED NOT DETECTED Final   Candida krusei NOT DETECTED NOT DETECTED Final   Candida parapsilosis NOT DETECTED NOT DETECTED Final   Candida tropicalis NOT DETECTED NOT DETECTED  Final  Culture, blood (routine x 2)     Status: Abnormal   Collection Time: 02/18/17  9:34 PM  Result Value Ref Range Status   Specimen Description BLOOD LEFT ARM  Final   Special Requests   Final    BOTTLES DRAWN AEROBIC AND ANAEROBIC Blood Culture adequate volume   Culture  Setup Time   Final    GRAM POSITIVE COCCI IN BOTH AEROBIC AND  ANAEROBIC BOTTLES CRITICAL RESULT CALLED TO, READ BACK BY AND VERIFIED WITH: L. Huntley Dec.D. 15:30 02/19/17 ([email or web address])    Culture METHICILLIN RESISTANT STAPHYLOCOCCUS AUREUS (A)  Final   Report Status 02/21/2017 FINAL  Final   Organism ID, Bacteria METHICILLIN RESISTANT STAPHYLOCOCCUS AUREUS  Final      Susceptibility   Methicillin resistant staphylococcus aureus - MIC*    CIPROFLOXACIN >=8 RESISTANT Resistant     ERYTHROMYCIN >=8 RESISTANT Resistant     GENTAMICIN <=0.5 SENSITIVE Sensitive     OXACILLIN >=4 RESISTANT Resistant     TETRACYCLINE <=1 SENSITIVE Sensitive     VANCOMYCIN 1 SENSITIVE Sensitive     TRIMETH/SULFA <=10 SENSITIVE Sensitive     CLINDAMYCIN <=0.25 SENSITIVE Sensitive     RIFAMPIN <=0.5 SENSITIVE Sensitive     Inducible Clindamycin NEGATIVE Sensitive     * METHICILLIN RESISTANT STAPHYLOCOCCUS AUREUS  MRSA PCR Screening     Status: None   Collection Time: 02/18/17 11:22 PM  Result Value Ref Range Status   MRSA by PCR NEGATIVE NEGATIVE Final    Comment:        The GeneXpert MRSA Assay (FDA approved for NASAL specimens only), is one component of a comprehensive MRSA colonization surveillance program. It is not intended to diagnose MRSA infection nor to guide or monitor treatment for MRSA infections.   MRSA PCR Screening     Status: Abnormal   Collection Time: 02/18/17 11:22 PM  Result Value Ref Range Status   MRSA by PCR POSITIVE (A) NEGATIVE Final    Comment:        The GeneXpert MRSA Assay (FDA approved for NASAL specimens only), is one component of a comprehensive MRSA colonization surveillance program. It is not intended to diagnose MRSA infection nor to guide or monitor treatment for MRSA infections. RESULT CALLED TO, READ BACK BY AND VERIFIED WITH: DULLA,R RN 2353 02/19/17 MITCHELL,L   Culture, blood (routine x 2)     Status: None (Preliminary result)   Collection Time: 02/20/17  5:20 AM  Result Value Ref Range Status   Specimen  Description BLOOD LEFT ANTECUBITAL  Final   Special Requests   Final    BOTTLES DRAWN AEROBIC AND ANAEROBIC Blood Culture adequate volume   Culture NO GROWTH 4 DAYS  Final   Report Status PENDING  Incomplete  Culture, blood (routine x 2)     Status: None (Preliminary result)   Collection Time: 02/20/17  5:21 AM  Result Value Ref Range Status   Specimen Description BLOOD LEFT HAND  Final   Special Requests   Final    BOTTLES DRAWN AEROBIC ONLY Blood Culture adequate volume   Culture NO GROWTH 4 DAYS  Final   Report Status PENDING  Incomplete      Radiology Studies: No results found.   Scheduled Meds: . aspirin EC  325 mg Oral Daily  . atorvastatin  40 mg Oral q1800  . [START ON 02/25/2017] carvedilol  12.5 mg Oral 2 times per day on Sun Tue Thu Sat  . cinacalcet  30 mg Oral  Q breakfast  . clopidogrel  75 mg Oral Daily  . darbepoetin (ARANESP) injection - DIALYSIS  60 mcg Intravenous Q Fri-HD  . diphenhydrAMINE  25 mg Oral Once  . doxercalciferol      . doxercalciferol  1.5 mcg Intravenous Q M,W,F-HD  . feeding supplement (NEPRO CARB STEADY)  237 mL Oral Q1500  . feeding supplement (PRO-STAT SUGAR FREE 64)  30 mL Oral Daily  . finasteride  5 mg Oral Daily  . heparin  5,000 Units Subcutaneous Q8H  . insulin aspart  0-5 Units Subcutaneous QHS  . insulin aspart  0-9 Units Subcutaneous TID WC  . insulin glargine  15 Units Subcutaneous QHS  . multivitamin  1 tablet Oral Q M,W,F-2000  . pantoprazole  40 mg Oral Daily  . sevelamer carbonate  2,400 mg Oral TID WC  . sodium chloride flush  3 mL Intravenous Q12H   Continuous Infusions: . sodium chloride    . sodium chloride    . sodium chloride    . sodium chloride Stopped (02/23/17 2300)  . vancomycin    . vancomycin Stopped (02/22/17 1334)     LOS: 6 days    Time spent: 30 min    Janece Canterbury, MD [facility] Pager [phone]  If 7PM-7AM, please contact night-coverage [email or web address] Password  [email or web address] 02/24/2017, 4:00 PM

## 2017-02-24 NOTE — Progress Notes (Signed)
Spoke with patient and family after procedure yesterday.  Patient now has unreconstructable disease in his foot.  He is going to need a right BKA.  I have scheduled this for Friday morning.  I will speak with the patient further about this Thursday.  Annamarie Major

## 2017-02-24 NOTE — Progress Notes (Signed)
Transport for Dialysis came to pick up patient for Dialysis. Patient refused to go. Nurse spoke with patient explaining importance of dialysis, patient stated "I have been receiving dialysis for 10 years, not satisfied with process here". Currently sitting up in chair, very disgruntled. States " I want to go home", notifying MD.

## 2017-02-25 LAB — VANCOMYCIN, RANDOM: VANCOMYCIN RM: 22

## 2017-02-25 LAB — CULTURE, BLOOD (ROUTINE X 2)
Culture: NO GROWTH
Culture: NO GROWTH
SPECIAL REQUESTS: ADEQUATE
Special Requests: ADEQUATE

## 2017-02-25 LAB — GLUCOSE, CAPILLARY
GLUCOSE-CAPILLARY: 190 mg/dL — AB (ref 65–99)
GLUCOSE-CAPILLARY: 186 mg/dL — AB (ref 65–99)
Glucose-Capillary: 142 mg/dL — ABNORMAL HIGH (ref 65–99)
Glucose-Capillary: 164 mg/dL — ABNORMAL HIGH (ref 65–99)

## 2017-02-25 MED ORDER — POLYETHYLENE GLYCOL 3350 17 G PO PACK
17.0000 g | PACK | Freq: Every day | ORAL | Status: DC
  Administered 2017-02-25: 17 g via ORAL
  Filled 2017-02-25: qty 1

## 2017-02-25 MED ORDER — TRIAMCINOLONE 0.1 % CREAM:EUCERIN CREAM 1:1
TOPICAL_CREAM | Freq: Three times a day (TID) | CUTANEOUS | Status: DC
  Administered 2017-02-25 – 2017-02-27 (×7): via TOPICAL
  Administered 2017-02-27: 1 via TOPICAL
  Administered 2017-02-27: 23:00:00 via TOPICAL
  Administered 2017-02-28: 1 via TOPICAL
  Administered 2017-02-28 – 2017-03-01 (×3): via TOPICAL
  Administered 2017-03-02: 1 via TOPICAL
  Administered 2017-03-02: 21:00:00 via TOPICAL
  Administered 2017-03-02: 1 via TOPICAL
  Administered 2017-03-03: 12:00:00 via TOPICAL
  Filled 2017-02-25: qty 1

## 2017-02-25 MED ORDER — OXYCODONE HCL 5 MG PO TABS: 5.0000 mg | ORAL_TABLET | ORAL | Status: DC | PRN

## 2017-02-25 NOTE — NC FL2 (Signed)
[place] LEVEL OF CARE SCREENING TOOL     IDENTIFICATION  Patient Name: Jacob [name] Birthdate: 1950-12-26 Sex: male Admission Date (Current Location): 02/18/2017  [facility] and [place] Number:  [employer] and Address:  [facility] [place] [place], [place] [address] [place], [place] [address]      Provider Number: [id number]  Attending Physician Name and Address:  Janece Canterbury, MD  Relative Name and Phone Number:       Current Level of Care: Hospital Recommended Level of Care: [place] Prior Approval Number:    Date Approved/Denied:   PASRR Number: [id number]  Discharge Plan: SNF    Current Diagnoses: Patient Active Problem List   Diagnosis Date Noted  . DNR (do not resuscitate) 02/24/2017  . Palliative care by specialist 02/24/2017  . Right foot infection   . Normocytic anemia 02/18/2017  . Wound infection 02/18/2017  . Hyponatremia 02/18/2017  . Hypokalemia 12/28/2016  . Insulin-requiring or dependent type II diabetes mellitus ([place]) 12/28/2016  . Gangrene of foot ([place]) 12/28/2016  . Gangrene of right foot ([place]) 12/28/2016  . Hyperkalemia 04/05/2016  . Inadequate dialysis 04/05/2016  . Other complications due to renal dialysis device, implant, and graft 04/28/2013  . Subclavian vein occlusion (HCC) 04/28/2013  . Preoperative cardiovascular examination 12/28/2012  . Mixed hyperlipidemia 06/16/2011  . Nonischemic cardiomyopathy ([place]) 11/20/2010  . End-stage renal disease on hemodialysis ([place]) 11/20/2010  . Essential hypertension, benign 11/20/2010    Orientation RESPIRATION BLADDER Height & Weight     Self, Time, Situation, Place  Normal Continent Weight: 268 lb 4.8 oz (121.7 kg) Height:  6\' 2"  (188 cm)  BEHAVIORAL SYMPTOMS/MOOD NEUROLOGICAL BOWEL NUTRITION STATUS      Continent Diet (Renal, carb modified)  AMBULATORY STATUS COMMUNICATION OF NEEDS Skin   Extensive Assist Verbally Skin abrasions, Other  (Comment) (diabetic toe ulcer)                       Personal Care Assistance Level of Assistance  Bathing, Dressing Bathing Assistance: Maximum assistance   Dressing Assistance: Maximum assistance     Functional Limitations Info             SPECIAL CARE FACTORS FREQUENCY  PT (By licensed PT), OT (By licensed OT)     PT Frequency: 5x/wk OT Frequency: 5x/wk            Contractures      Additional Factors Info  Code Status, Allergies, Isolation Precautions Code Status Info: DNR Allergies Info: Bee Venom, Iodine, Penicillins, Sulfadiazine     Isolation Precautions Info: Contact precautions, MRSA     Current Medications (02/25/2017):  This is the current hospital active medication list Current Facility-Administered Medications  Medication Dose Route Frequency Provider Last Rate Last Dose  . 0.9 %  sodium chloride infusion  250 mL Intravenous PRN Opyd, Ilene Qua, MD      . 0.9 %  sodium chloride infusion  500 mL Intravenous Once PRN [name], MD   Stopped at 02/23/17 2300  . acetaminophen (TYLENOL) tablet 1,000 mg  1,000 mg Oral Q6H PRN Opyd, Ilene Qua, MD   1,000 mg at 02/20/17 1732  . albuterol (PROVENTIL) (2.5 MG/3ML) 0.083% nebulizer solution 2.5 mg  2.5 mg Nebulization Q6H PRN Ritta Slot, NP      . alum & mag hydroxide-simeth (MAALOX/MYLANTA) 200-200-20 MG/5ML suspension 15-30 mL  15-30 mL Oral Q2H PRN [name], MD      .  aspirin EC tablet 325 mg  325 mg Oral Daily Opyd, Ilene Qua, MD   325 mg at 02/25/17 4270  . atorvastatin (LIPITOR) tablet 40 mg  40 mg Oral q1800 Opyd, Ilene Qua, MD   40 mg at 02/24/17 1832  . bisacodyl (DULCOLAX) EC tablet 10 mg  10 mg Oral Daily PRN Opyd, Ilene Qua, MD   10 mg at 02/20/17 0947  . carvedilol (COREG) tablet 12.5 mg  12.5 mg Oral 2 times per day on Sun Tue Thu Sat Nita Sells, MD   12.5 mg at 02/25/17 0900  . cinacalcet (SENSIPAR) tablet 30 mg  30 mg Oral Q breakfast Opyd, Ilene Qua, MD   30 mg at  02/25/17 0949  . clopidogrel (PLAVIX) tablet 75 mg  75 mg Oral Daily Opyd, Ilene Qua, MD   75 mg at 02/25/17 0950  . Darbepoetin Alfa (ARANESP) injection 60 mcg  60 mcg Intravenous Q Fri-HD [name], MD   60 mcg at 02/19/17 1722  . dextromethorphan (DELSYM) 30 MG/5ML liquid 30 mg  30 mg Oral BID PRN Opyd, Ilene Qua, MD      . doxercalciferol (HECTOROL) injection 1.5 mcg  1.5 mcg Intravenous Q M,W,F-HD Pearson Grippe B, MD   1.5 mcg at 02/24/17 1601  . feeding supplement (NEPRO CARB STEADY) liquid 237 mL  237 mL Oral Q1500 Nita Sells, MD   237 mL at 02/21/17 1638  . feeding supplement (PRO-STAT SUGAR FREE 64) liquid 30 mL  30 mL Oral Daily Nita Sells, MD   30 mL at 02/25/17 0955  . finasteride (PROSCAR) tablet 5 mg  5 mg Oral Daily Nita Sells, MD   5 mg at 02/25/17 0950  . heparin injection 5,000 Units  5,000 Units Subcutaneous Q8H Opyd, Ilene Qua, MD   5,000 Units at 02/24/17 2246  . hydrALAZINE (APRESOLINE) injection 5 mg  5 mg Intravenous Q20 Min PRN [name], MD      . HYDROcodone-acetaminophen (NORCO/VICODIN) 5-325 MG per tablet 1 tablet  1 tablet Oral Q4H PRN Gardiner Barefoot, NP   1 tablet at 02/25/17 0604  . insulin aspart (novoLOG) injection 0-5 Units  0-5 Units Subcutaneous QHS Opyd, Timothy S, MD      . insulin aspart (novoLOG) injection 0-9 Units  0-9 Units Subcutaneous TID WC Opyd, Ilene Qua, MD   2 Units at 02/25/17 1300  . insulin glargine (LANTUS) injection 15 Units  15 Units Subcutaneous QHS Opyd, Ilene Qua, MD   15 Units at 02/21/17 2137  . labetalol (NORMODYNE,TRANDATE) injection 10 mg  10 mg Intravenous Q10 min PRN [name], MD      . loratadine (CLARITIN) tablet 10 mg  10 mg Oral Daily PRN Opyd, Ilene Qua, MD   10 mg at 02/23/17 0425  . metoprolol tartrate (LOPRESSOR) injection 2-5 mg  2-5 mg Intravenous Q2H PRN [name], MD      . multivitamin (RENA-VIT) tablet 1 tablet  1 tablet Oral Q M,W,F-2000 Opyd, Ilene Qua,  MD   1 tablet at 02/24/17 2246  . ondansetron (ZOFRAN) tablet 4 mg  4 mg Oral Q6H PRN Opyd, Ilene Qua, MD       Or  . ondansetron (ZOFRAN) injection 4 mg  4 mg Intravenous Q6H PRN Opyd, Ilene Qua, MD      . pantoprazole (PROTONIX) EC tablet 40 mg  40 mg Oral Daily Opyd, Ilene Qua, MD   40 mg at 02/25/17 0953  . phenol (CHLORASEPTIC) mouth  spray 1 spray  1 spray Mouth/Throat PRN [name], MD      . polyethylene glycol (MIRALAX / GLYCOLAX) packet 17 g  17 g Oral Daily Janece Canterbury, MD   17 g at 02/25/17 0947  . sevelamer carbonate (RENVELA) tablet 2,400 mg  2,400 mg Oral TID WC Opyd, Ilene Qua, MD   2,400 mg at 02/25/17 1300  . sevelamer carbonate (RENVELA) tablet 800 mg  800 mg Oral BID PRN Opyd, Ilene Qua, MD      . sodium chloride flush (NS) 0.9 % injection 3 mL  3 mL Intravenous Q12H Opyd, Ilene Qua, MD   3 mL at 02/25/17 0953  . sodium chloride flush (NS) 0.9 % injection 3 mL  3 mL Intravenous PRN Opyd, Ilene Qua, MD      . triamcinolone 0.1 % cream : eucerin cream, 1:1   Topical TID Short, Mackenzie, MD      . vancomycin (VANCOCIN) IVPB 1000 mg/200 mL premix  1,000 mg Intravenous Q M,W,F-HD Rumbarger, Valeda Malm, RPH 200 mL/hr at 02/24/17 1601 1,000 mg at 02/24/17 1601     Discharge Medications: Please see discharge summary for a list of discharge medications.  Relevant Imaging Results:  Relevant Lab Results:   Additional Information SS#: [id number], Patient has dialysis MWF at [facility] in [facility], [place]

## 2017-02-25 NOTE — Progress Notes (Signed)
Nutrition Follow-up  DOCUMENTATION CODES:   Obesity unspecified  INTERVENTION:   Continue to offer supplements:   Nepro Shake once daily   Pro-stat 30 ml once daily  NUTRITION DIAGNOSIS:   Increased nutrient needs related to wound healing as evidenced by estimated needs.  Ongoing  GOAL:   Patient will meet greater than or equal to 90% of their needs  Unmet  MONITOR:   PO intake, Supplement acceptance, Labs, Weight trends, Skin, I & O's  ASSESSMENT:   66 y.o. male with medical history significant for end-stage renal disease on hemodialysis, peripheral arterial disease status post angioplasty, insulin-dependent diabetes mellitus, anemia, and right TMA on 01/01/2017 with wound dehiscence, now presenting for evaluation of worsening pain in the right leg and increased drainage from the TMA site.  Patient is now refusing any further treatment, including BKA surgery. Palliative Care Team following with plans for family meeting this afternoon. Overall PO intake is poor, consuming 0-75% of meals. He is being offered Nepro Shake once daily and Pro-stat liquid protein supplement once daily. Labs and medications reviewed.  Diet Order:  Diet renal/carb modified with fluid restriction Diet-HS Snack? Nothing; Room service appropriate? Yes; Fluid consistency: Thin  Skin:  Wound (see comment) (R foot non healing TMA site, needs BKA)  Last BM:  7/20  Height:   Ht Readings from Last 1 Encounters:  02/18/17 6\' 2"  (1.88 m)    Weight:   Wt Readings from Last 1 Encounters:  02/24/17 268 lb 4.8 oz (121.7 kg)    Ideal Body Weight:  86.36 kg  BMI:  Body mass index is 34.45 kg/m.  Estimated Nutritional Needs:   Kcal:  2150-2350  Protein:  125-140 grams  Fluid:  Per MD  EDUCATION NEEDS:   No education needs identified at this time  Molli Barrows, [place], [place], [place] Pager [phone] After Hours Pager [phone]

## 2017-02-25 NOTE — Consult Note (Signed)
   [facility] Inpatient Consult   02/25/2017  Jacob [name] 06/14/1951 [id number]   Update:  Chart reviewed with Palliative Care notes.  Will follow for disposition and appropriate ongoing [facility] Care Management needs.  Please contact;  Natividad Brood, RN BSN [facility] Liaison  [phone] business mobile phone Toll free office [phone]

## 2017-02-25 NOTE — Evaluation (Signed)
Physical Therapy Evaluation Patient Details Name: Jacob [name] MRN: [id number] DOB: 06-18-1951 Today's Date: 02/25/2017   History of Present Illness  Patient is a 66 yo male admitted for Rt foot cellulitis with history of transmet. pt declining progression to BKA. PMH:  HTN, DM, anemia, ESRD on HD, anxiety, depression, CVA, NICM, arthritis  Clinical Impression  Pt agreeable to mobilize with PT. States his bottom is very sore and agreeable to transfer to recliner chair. Pt required no physical assistance for bed mobility and minimal to stand; however pt with increased impulsive behavior during stand pivot transfer to chair with RW. Right as reaching chair, pt stated "I have to sit; I'm going to sit." PT attempted to stop patient and gave max VCs and physical assistance to back all the way up to chair, but pt slowly lowered himself to the floor. Nurse assistant present and both PT staff and nurse assistant used lift to get patient back into chair. Pt noted no increased pain. Pt presents with deficits listed in PT problem list below and will benefit from acute therapy for safe mobilization and transfer training. At this time, SNF would be appropriate to work on these aspects of care prior to return home.     Follow Up Recommendations Supervision/Assistance - 24 hour;SNF    Equipment Recommendations  None recommended by PT (TBD with progress with PT)    Recommendations for Other Services OT consult     Precautions / Restrictions Precautions Precautions: Fall Restrictions Weight Bearing Restrictions: No      Mobility  Bed Mobility Overal bed mobility: Needs Assistance Bed Mobility: Supine to Sit     Supine to sit: Min guard     General bed mobility comments: Min guard for safety. Pt required no physical assist, but required use of bed rails, increased time and VCs for sequencing, HOB 30 degrees with increased time   Transfers Overall transfer level: Needs assistance   Transfers:  Sit to/from Stand;Stand Pivot Transfers Sit to Stand: Min assist;+2 safety/equipment Stand pivot transfers: Min assist;+2 safety/equipment       General transfer comment: Min A to rise. VCs for hand placement. Pt able to step to chair with use of RW and cues for sequence and safety. Pt with RW at edge of chair and became fatigued and began sitting despite cues and assist from P.T. and SPT Pt with slow descent to edge of chair, unable to fully reach edge and continued slow descent to floor. Maximove assist from floor to chair with 3 person assist  Ambulation/Gait                Stairs            Wheelchair Mobility    Modified Rankin (Stroke Patients Only)       Balance Overall balance assessment: Needs assistance Sitting-balance support: Bilateral upper extremity supported;Feet unsupported Sitting balance-Leahy Scale: Fair     Standing balance support: Bilateral upper extremity supported;During functional activity Standing balance-Leahy Scale: Poor Standing balance comment: Pt requires use of RW for Bil UE support in standing                              Pertinent Vitals/Pain Pain Assessment: 0-10 Pain Score: 10-Worst pain ever Pain Location: bottom Pain Descriptors / Indicators: Sore Pain Intervention(s): Limited activity within patient's tolerance;Monitored during session;Repositioned    Home Living Family/patient expects to be discharged to:: Skilled nursing facility Living Arrangements:  Alone Available Help at Discharge: Family;Available PRN/intermittently Type of Home: Apartment Home Access: Level entry     Home Layout: One level Home Equipment: Walker - 4 wheels;Cane - single point      Prior Function Level of Independence: Independent with assistive device(s)               Hand Dominance        Extremity/Trunk Assessment   Upper Extremity Assessment Upper Extremity Assessment: Generalized weakness    Lower Extremity  Assessment Lower Extremity Assessment: Generalized weakness RLE Deficits / Details: Cellulitis Rt foot; pt with decreased WB     Cervical / Trunk Assessment Cervical / Trunk Assessment: Kyphotic  Communication   Communication: No difficulties  Cognition Arousal/Alertness: Awake/alert Behavior During Therapy: Impulsive (Very impulsive with transfers) Overall Cognitive Status: No family/caregiver present to determine baseline cognitive functioning                                        General Comments      Exercises     Assessment/Plan    PT Assessment Patient needs continued PT services  PT Problem List Decreased activity tolerance;Decreased balance;Decreased mobility;Decreased safety awareness;Pain;Decreased cognition;Decreased knowledge of use of DME;Obesity;Decreased strength       PT Treatment Interventions DME instruction;Gait training;Stair training;Functional mobility training;Therapeutic activities;Therapeutic exercise;Balance training;Patient/family education    PT Goals (Current goals can be found in the Care Plan section)  Acute Rehab PT Goals Patient Stated Goal: decrease pain  PT Goal Formulation: With patient Time For Goal Achievement: 03/11/17 Potential to Achieve Goals: Good    Frequency Min 3X/week   Barriers to discharge Decreased caregiver support      Co-evaluation               AM-PAC PT "6 Clicks" Daily Activity  Outcome Measure Difficulty turning over in bed (including adjusting bedclothes, sheets and blankets)?: A Little Difficulty moving from lying on back to sitting on the side of the bed? : Total Difficulty sitting down on and standing up from a chair with arms (e.g., wheelchair, bedside commode, etc,.)?: A Little Help needed moving to and from a bed to chair (including a wheelchair)?: A Lot Help needed walking in hospital room?: A Lot Help needed climbing 3-5 steps with a railing? : Total 6 Click Score: 12     End of Session Equipment Utilized During Treatment: Gait belt Activity Tolerance: Patient limited by fatigue Patient left: in chair;with call bell/phone within reach;with nursing/sitter in room;with chair alarm set Nurse Communication: Mobility status;Need for lift equipment;Precautions (Communicated impulsive behavior with transfers and mobility status) PT Visit Diagnosis: Unsteadiness on feet (R26.81);Other abnormalities of gait and mobility (R26.89);Pain;Difficulty in walking, not elsewhere classified (R26.2);Muscle weakness (generalized) (M62.81)    Time: 0272-5366 PT Time Calculation (min) (ACUTE ONLY): 38 min   Charges:   PT Evaluation $PT Eval Moderate Complexity: 1 Procedure PT Treatments $Therapeutic Activity: 8-22 mins   PT G Codes:        Elberta Leatherwood, SPT Acute Rehab [place] 02/25/2017, 1:33 PM

## 2017-02-25 NOTE — Clinical Social Work Note (Signed)
Clinical Social Work Assessment  Patient Details  Name: Jacob [name] MRN: [id number] Date of Birth: 12/14/1950  Date of referral:  02/25/17               Reason for consult:  Facility Placement, Discharge Planning                Permission sought to share information with:  Facility Contact Representative Permission granted to share information::  Yes, Verbal Permission Granted  Name::        Agency::  SNF  Relationship::     Contact Information:     Housing/Transportation Living arrangements for the past 2 months:  Apartment Source of Information:  Patient Patient Interpreter Needed:  None Criminal Activity/Legal Involvement Pertinent to Current Situation/Hospitalization:  No - Comment as needed Significant Relationships:  Other Family Members Lives with:  Self Do you feel safe going back to the place where you live?  Yes Need for family participation in patient care:  No (Coment)  Care giving concerns:  Patient lives at home alone, but has been having difficulty with mobility; needs assistance at short term rehab prior to returning home in order to be independent.   Social Worker assessment / plan:  CSW met with patient and discussed recommendation for SNF. Patient expressed he did not want to go to [facility], but would be open to going anywhere else. CSW to complete referral and follow up with bed offers to determine placement.  Employment status:  Retired Insurance information:  Medicare PT Recommendations:  Skilled Nursing Facility Information / Referral to community resources:  Skilled Nursing Facility  Patient/Family's Response to care:  Patient agreeable to SNF placement.  Patient/Family's Understanding of and Emotional Response to Diagnosis, Current Treatment, and Prognosis:  Patient aware of diagnosis and need for additional care at this time. Patient indicated understanding of CSW role in discharge planning.  Emotional Assessment Appearance:  Appears stated  age Attitude/Demeanor/Rapport:    Affect (typically observed):  Appropriate Orientation:  Oriented to Situation, Oriented to  Time, Oriented to Place, Oriented to Self Alcohol / Substance use:  Not Applicable Psych involvement (Current and /or in the community):  No (Comment)  Discharge Needs  Concerns to be addressed:  Care Coordination, Discharge Planning Concerns Readmission within the last 30 days:  No Current discharge risk:  Lives alone, Physical Impairment Barriers to Discharge:  Continued Medical Work up   Elizabeth M Paisley, LCSW 02/25/2017, 5:15 PM  

## 2017-02-25 NOTE — Progress Notes (Signed)
Pharmacy Antibiotic Note  Jacob [name] is a 66 y.o. male admitted on 02/18/2017 with cellulitis. Found to have MRSA bacteremia.  Pharmacy has been consulted for vancomycin dosing.  Patient is ESRD on HD-MWF. Received an appropriate loading dose and correct doses after HD sessions. On schedule for further HD sessions and doses.   Pre-HD Vanc Level @ 0500: 22 (therapeutic)  Vascular following for possibility of AKA vs BKA as doubtful TMA will heal.  ID recommends 4 weeks of treatment from negative cultures on 7/21.   Plan: Vancomycin 1g IV qHD-MWF.  Follow HD schedule/tolerance, surgery plans (and watching for extra doses ordered around OR). Monitor clinic progress, HD schedule and tolerance, electrolytes F/u BCx, C/S, vancomycin trough levels, future de-escalation, & length of therapy  Height: 6\' 2"  (188 cm) Weight: 268 lb 4.8 oz (121.7 kg) IBW/kg (Calculated) : 82.2  Temp (24hrs), Avg:98.3 F (36.8 C), Min:97.6 F (36.4 C), Max:98.9 F (37.2 C)   Recent Labs Lab 02/18/17 1821 02/18/17 1835 02/18/17 2325 02/19/17 0503 02/22/17 0310 02/23/17 0346 02/24/17 0002 02/24/17 0449 02/25/17 0340  WBC 20.8*  --  19.4* 21.3* 17.1* 19.8* 16.5* 17.9*  --   CREATININE 6.23*  --  6.45* 6.66* 8.57*  --  7.54*  --   --   LATICACIDVEN  --  1.90  --   --   --   --   --   --   --   VANCORANDOM  --   --   --   --   --   --   --   --  22    Estimated Creatinine Clearance: 13.2 mL/min (A) (by C-G formula based on SCr of 7.54 mg/dL (H)).    Allergies  Allergen Reactions  . Bee Venom Anaphylaxis  . Iodine Swelling  . Penicillins Swelling and Other (See Comments)    SWELLING REACTION UNSPECIFIED   Has patient had a PCN reaction causing immediate rash, facial/tongue/throat swelling, SOB or lightheadedness with hypotension: No Has patient had a PCN reaction causing severe rash involving mucus membranes or skin necrosis: No Has patient had a PCN reaction that required hospitalization No Has  patient had a PCN reaction occurring within the last 10 years: No If all of the above answers are "NO", then may proceed with Cephalosporin use.  . Sulfadiazine Other (See Comments)    1% Silver Sulfadiazine cream causes burning over a large area of skin.    Antimicrobials this admission: Vanc 7/19>> *loaded with 2g and given appropriate dose after HD sessions on 7/20 and 7/23 Ceftriaxone 7/19 >>7/20 Flagyl 7/19 >>7/20 Cipro x 1 7/19 Clinda x 1 7/19  Dose adjustments this admission: N/A  Microbiology results: 7/19 BCx: 2/2 MRSA- R to cipro 7/19 BCID MRSA 7/21 BCx: ngtd  Thank you for allowing pharmacy to be a part of this patient's care.  Nida Boatman, PharmD PGY1 Acute Care Pharmacy Resident Pager: [phone]  02/25/2017 11:14 AM

## 2017-02-25 NOTE — Progress Notes (Signed)
PROGRESS NOTE  Jacob [name]  GBE:[id number] DOB: 08-05-1950 DOA: 02/18/2017 PCP: Sharilyn Sites, MD  Brief Narrative:   The patient is a 66 year old male resident of the [facility] with diabetes mellitus, ESRD, hypertension, previous CVA, peripheral arterial disease who presented with a wound infection.  He recently underwent revascularization of the right lower extremity and had a transmetatarsal amputation performed on 01/01/2017 of the right foot. He was seen at the vascular office on July 17 and was found to have poor wound healing. He came to the emergency department on 7/19 with wound dehiscence and a marked leukocytosis. Vascular surgery was consulted.  The patient underwent angiography on 7/24. Unfortunately, the vascular surgeon was unable to perform angioplasty of the right anterior tibial artery and he had no surgical targets.  Dr. Trula Slade recommended a right BKA for source control as the patient has been found to have MRSA bacteremia.  The patient declined amputation and preferred to continue antibiotics with the understanding that he may eventually succumbed to his infection and he will never be cured. He met with palliative care who assisted with pain management. The patient is now DO NOT RESUSCITATE. He has elected to continue hemodialysis for now.  Assessment & Plan:   Principal Problem:   Wound infection Active Problems:   End-stage renal disease on hemodialysis ([place])   Essential hypertension, benign   Insulin-requiring or dependent type II diabetes mellitus ([place])   Normocytic anemia   Hyponatremia   Right foot infection   DNR (do not resuscitate)   Palliative care by specialist  Infected right nonhealing transmetatarsal amputation wound with dehiscence, likely the source of his MRSA bacteremia.  He was seen by vascular surgery and by palliative care.   -  Patient declines right BKA -  Continue vancomycin per pharmacy and will need vancomycin at HD after discharge -   Palliative care to continue to follow  MRSA bacteremia, likely secondary to his forefoot infection -  MRI in 7/20 suggested possible discitis, however his back pain has improved and he will already be receiving a long course of vancomycin -  Continue vancomycin for 6 weeks, last day on August 31 -  ECHO:  No evidence of endocarditis -  ID consult appreciated  Toxic metabolic encephalopathy secondary to sepsis and sedating medications, resolved  End-stage renal disease, normally a Monday Wednesday Friday schedule.  Hyponatremia and secondary hyperparathyroidism related. -  Appreciate nephrology assistance  Acute blood loss anemia superimposed on anemia of chronic disease -  Transfused 2 units of pack red blood cells on 7/23 -  Receiving iron and EPO per nephrology  History of CVA, stable, continue Plavix and aspirin  Nonischemic cardiomyopathy, EF was 55-60 percent on 03/07/2015, volume control at dialysis  Hypertension with intermittent hypotension -  Continue Coreg -  Consider resuming lisinopril if his blood pressure remained stable during hemodialysis  Possible seizure disorder, bipolar disorder. Patient was previously on Depakote but had not taking the medication in 2 months. Currently being held.  Diabetes mellitus type 2 with peripheral vascular palpitations, CBG well controlled.  A1c 7.7 in May 2018 -  Continue Lantus 15 units daily at bedtime -  Continue sliding scale insulin  Persistent leukocytosis secondary to MRSA bacteremia and osteomyelitis of his foot  DVT prophylaxis:  lovenox Code Status:  DNR Family Communication:  Patient alone Disposition Plan:  Awaiting SNF placement.  Will need HD center and ability to continue vancomycin at HD for 6 weeks.  Consultants:  Nephrology  Dr. Trula Slade, VVS  Infectious disease, Dr. Novella Olive  Procedures:   7/24:  Aortogram with RLE run-off and failed attempt at angioplasty of the RLE on 7/24   Aortogram:  No  significant renal artery stenosis.  No infrarenal aortic stenosis.  Bilateral iliac arteries are widely patent             Right Lower Extremity:  The right common femoral profunda femoral and superficial femoral artery are patent without stenosis.  The popliteal artery is patent throughout it's course.  There is mild luminal irregularity at the curve of the anterior tibial artery.  Anterior tibial is patent down to just above the ankle where it occludes.  The posterior tibial and peroneal arteries are occluded             Left Lower Extremity:  Not evaluated   Antimicrobials:  Anti-infectives    Start     Dose/Rate Route Frequency Ordered Stop   02/22/17 0928  vancomycin (VANCOCIN) 1-5 GM/200ML-% IVPB    Comments:  [name]   : cabinet override      02/22/17 0928 02/22/17 1119   02/19/17 1200  vancomycin (VANCOCIN) IVPB 1000 mg/200 mL premix     1,000 mg 200 mL/hr over 60 Minutes Intravenous Every M-W-F (Hemodialysis) 02/18/17 2109     02/18/17 2200  cefTRIAXone (ROCEPHIN) 2 g in dextrose 5 % 50 mL IVPB  Status:  Discontinued     2 g 100 mL/hr over 30 Minutes Intravenous Every 24 hours 02/18/17 2158 02/19/17 1854   02/18/17 2200  metroNIDAZOLE (FLAGYL) IVPB 500 mg  Status:  Discontinued     500 mg 100 mL/hr over 60 Minutes Intravenous Every 8 hours 02/18/17 2158 02/19/17 1854   02/18/17 2115  clindamycin (CLEOCIN) IVPB 600 mg     600 mg 100 mL/hr over 30 Minutes Intravenous  Once 02/18/17 2101 02/18/17 2228   02/18/17 2115  ciprofloxacin (CIPRO) IVPB 400 mg     400 mg 200 mL/hr over 60 Minutes Intravenous  Once 02/18/17 2101 02/18/17 2327   02/18/17 2115  vancomycin (VANCOCIN) 2,000 mg in sodium chloride 0.9 % 500 mL IVPB     2,000 mg 250 mL/hr over 120 Minutes Intravenous  Once 02/18/17 2104 02/19/17 0149       Subjective:  Still frustrated with his overall experience.  His foot continues to hurt him intermittently. He does not feel well.   He wants to be discharged.  He  understands that without amputation of his foot that he will likely eventually succumbed to his infection. He would like to continue taking antibiotics and infectious diseases recommended 6 weeks.  Objective: Vitals:   02/24/17 1706 02/24/17 1958 02/25/17 0429 02/25/17 0800  BP: (!) 156/82 (!) 94/48 (!) 132/56 126/88  Pulse: 80 81 72 72  Resp: (!) 22 18 (!) 21 12  Temp: 98.2 F (36.8 C)  98.7 F (37.1 C) 97.6 F (36.4 C)  TempSrc: Oral  Oral Oral  SpO2: 98% 93% 91% 98%  Weight: 121.7 kg (268 lb 4.8 oz)     Height:        Intake/Output Summary (Last 24 hours) at 02/25/17 1504 Last data filed at 02/24/17 1706  Gross per 24 hour  Intake                0 ml  Output             3000 ml  Net            -  3000 ml   Filed Weights   02/24/17 0352 02/24/17 1300 02/24/17 1706  Weight: 125.3 kg (276 lb 3.8 oz) 124.9 kg (275 lb 5.7 oz) 121.7 kg (268 lb 4.8 oz)    Examination:  General exam:  Adult male, no acute distress HEENT: Normocephalic atraumatic, moist mucous membranes Respiratory system:  clear to auscultation bilaterally Cardiovascular systregular rate and rhythm, no murmurs, rubs, or gallops, warm extremities strointestinal system:NABS, soft, nondistended, nontender MSK:   right foot remains bandaged, 1+ edema of the right lower extremity Neuro: Grossly moves all extremities     Data Reviewed: I have personally reviewed following labs and imaging studies  CBC:  Recent Labs Lab 02/18/17 1821  02/19/17 0503 02/22/17 0310 02/23/17 0346 02/24/17 0002 02/24/17 0449  WBC 20.8*  < > 21.3* 17.1* 19.8* 16.5* 17.9*  NEUTROABS 17.6*  --  18.1*  --  15.5*  --  15.2*  HGB 8.0*  < > 7.7* 6.9* 8.7* 8.7* 8.8*  HCT 26.6*  < > 25.9* 22.7* 27.8* 27.9* 28.5*  MCV 84.7  < > 84.4 80.5 81.8 82.3 82.1  PLT 353  < > 396 420* 508* 482* 535*  < > = values in this interval not displayed. Basic Metabolic Panel:  Recent Labs Lab 02/18/17 1821 02/18/17 2325 02/19/17 0503  02/22/17 0310 02/24/17 0002  NA 131* 130* 134* 130* 128*  K 3.8 3.6 3.8 3.6 3.9  CL 94* 92* 94* 92* 92*  CO2 '25 26 27 26 25  ' GLUCOSE 175* 174* 152* 71 149*  BUN 24* 25* 27* 47* 38*  CREATININE 6.23* 6.45* 6.66* 8.57* 7.54*  CALCIUM 8.5* 8.6* 8.7* 8.0* 8.1*  PHOS  --  2.6  --   --  3.5   GFR: Estimated Creatinine Clearance: 13.2 mL/min (A) (by C-G formula based on SCr of 7.54 mg/dL (H)). Liver Function Tests:  Recent Labs Lab 02/18/17 1821 02/18/17 2325 02/22/17 0310 02/24/17 0002  AST 19  --  26  --   ALT 9*  --  9*  --   ALKPHOS 184*  --  193*  --   BILITOT 0.4  --  0.9  --   PROT 8.1  --  7.9  --   ALBUMIN 2.2* 2.2* 1.9* 1.9*   No results for input(s): LIPASE, AMYLASE in the last 168 hours. No results for input(s): AMMONIA in the last 168 hours. Coagulation Profile: No results for input(s): INR, PROTIME in the last 168 hours. Cardiac Enzymes: No results for input(s): CKTOTAL, CKMB, CKMBINDEX, TROPONINI in the last 168 hours. BNP (last 3 results) No results for input(s): PROBNP in the last 8760 hours. HbA1C: No results for input(s): HGBA1C in the last 72 hours. CBG:  Recent Labs Lab 02/24/17 1128 02/24/17 1839 02/24/17 2101 02/25/17 0629 02/25/17 1131  GLUCAP 205* 166* 142* 142* 164*   Lipid Profile: No results for input(s): CHOL, HDL, LDLCALC, TRIG, CHOLHDL, LDLDIRECT in the last 72 hours. Thyroid Function Tests: No results for input(s): TSH, T4TOTAL, FREET4, T3FREE, THYROIDAB in the last 72 hours. Anemia Panel: No results for input(s): VITAMINB12, FOLATE, FERRITIN, TIBC, IRON, RETICCTPCT in the last 72 hours. Urine analysis: No results found for: COLORURINE, APPEARANCEUR, LABSPEC, PHURINE, GLUCOSEU, HGBUR, BILIRUBINUR, KETONESUR, PROTEINUR, UROBILINOGEN, NITRITE, LEUKOCYTESUR Sepsis Labs: '@LABRCNTIP' (procalcitonin:4,lacticidven:4)  ) Recent Results (from the past 240 hour(s))  Culture, blood (routine x 2)     Status: Abnormal   Collection Time:  02/18/17  6:21 PM  Result Value Ref Range Status   Specimen Description BLOOD  LEFT HAND  Final   Special Requests IN PEDIATRIC BOTTLE Blood Culture adequate volume  Final   Culture  Setup Time   Final    GRAM POSITIVE COCCI IN CLUSTERS AEROBIC BOTTLE ONLY CRITICAL RESULT CALLED TO, READ BACK BY AND VERIFIED WITH: L. Huntley Dec.D. 15:30 02/19/17 ([email or web address])    Culture (A)  Final    STAPHYLOCOCCUS AUREUS SUSCEPTIBILITIES PERFORMED ON PREVIOUS CULTURE WITHIN THE LAST 5 DAYS.    Report Status 02/21/2017 FINAL  Final  Blood Culture ID Panel (Reflexed)     Status: Abnormal   Collection Time: 02/18/17  6:21 PM  Result Value Ref Range Status   Enterococcus species NOT DETECTED NOT DETECTED Final   Listeria monocytogenes NOT DETECTED NOT DETECTED Final   Staphylococcus species DETECTED (A) NOT DETECTED Final    Comment: CRITICAL RESULT CALLED TO, READ BACK BY AND VERIFIED WITH: L. Huntley Dec.D. 15:30 02/19/17 ([email or web address])    Staphylococcus aureus DETECTED (A) NOT DETECTED Final    Comment: Methicillin (oxacillin)-resistant Staphylococcus aureus (MRSA). MRSA is predictably resistant to beta-lactam antibiotics (except ceftaroline). Preferred therapy is vancomycin unless clinically contraindicated. Patient requires contact precautions if  hospitalized. CRITICAL RESULT CALLED TO, READ BACK BY AND VERIFIED WITH: L. Huntley Dec.D. 15:30 02/19/17 ([email or web address])    Methicillin resistance DETECTED (A) NOT DETECTED Final    Comment: CRITICAL RESULT CALLED TO, READ BACK BY AND VERIFIED WITH: L. Huntley Dec.D. 15:30 02/19/17 ([email or web address])    Streptococcus species NOT DETECTED NOT DETECTED Final   Streptococcus agalactiae NOT DETECTED NOT DETECTED Final   Streptococcus pneumoniae NOT DETECTED NOT DETECTED Final   Streptococcus pyogenes NOT DETECTED NOT DETECTED Final   Acinetobacter baumannii NOT DETECTED NOT DETECTED Final   Enterobacteriaceae species NOT DETECTED NOT DETECTED Final   Enterobacter cloacae  complex NOT DETECTED NOT DETECTED Final   Escherichia coli NOT DETECTED NOT DETECTED Final   Klebsiella oxytoca NOT DETECTED NOT DETECTED Final   Klebsiella pneumoniae NOT DETECTED NOT DETECTED Final   Proteus species NOT DETECTED NOT DETECTED Final   Serratia marcescens NOT DETECTED NOT DETECTED Final   Haemophilus influenzae NOT DETECTED NOT DETECTED Final   Neisseria meningitidis NOT DETECTED NOT DETECTED Final   Pseudomonas aeruginosa NOT DETECTED NOT DETECTED Final   Candida albicans NOT DETECTED NOT DETECTED Final   Candida glabrata NOT DETECTED NOT DETECTED Final   Candida krusei NOT DETECTED NOT DETECTED Final   Candida parapsilosis NOT DETECTED NOT DETECTED Final   Candida tropicalis NOT DETECTED NOT DETECTED Final  Culture, blood (routine x 2)     Status: Abnormal   Collection Time: 02/18/17  9:34 PM  Result Value Ref Range Status   Specimen Description BLOOD LEFT ARM  Final   Special Requests   Final    BOTTLES DRAWN AEROBIC AND ANAEROBIC Blood Culture adequate volume   Culture  Setup Time   Final    GRAM POSITIVE COCCI IN BOTH AEROBIC AND ANAEROBIC BOTTLES CRITICAL RESULT CALLED TO, READ BACK BY AND VERIFIED WITH: L. Huntley Dec.D. 15:30 02/19/17 ([email or web address])    Culture METHICILLIN RESISTANT STAPHYLOCOCCUS AUREUS (A)  Final   Report Status 02/21/2017 FINAL  Final   Organism ID, Bacteria METHICILLIN RESISTANT STAPHYLOCOCCUS AUREUS  Final      Susceptibility   Methicillin resistant staphylococcus aureus - MIC*    CIPROFLOXACIN >=8 RESISTANT Resistant     ERYTHROMYCIN >=8 RESISTANT Resistant     GENTAMICIN <=0.5 SENSITIVE Sensitive     OXACILLIN >=4 RESISTANT Resistant     TETRACYCLINE <=1  SENSITIVE Sensitive     VANCOMYCIN 1 SENSITIVE Sensitive     TRIMETH/SULFA <=10 SENSITIVE Sensitive     CLINDAMYCIN <=0.25 SENSITIVE Sensitive     RIFAMPIN <=0.5 SENSITIVE Sensitive     Inducible Clindamycin NEGATIVE Sensitive     * METHICILLIN RESISTANT STAPHYLOCOCCUS AUREUS  MRSA  PCR Screening     Status: None   Collection Time: 02/18/17 11:22 PM  Result Value Ref Range Status   MRSA by PCR NEGATIVE NEGATIVE Final    Comment:        The GeneXpert MRSA Assay (FDA approved for NASAL specimens only), is one component of a comprehensive MRSA colonization surveillance program. It is not intended to diagnose MRSA infection nor to guide or monitor treatment for MRSA infections.   MRSA PCR Screening     Status: Abnormal   Collection Time: 02/18/17 11:22 PM  Result Value Ref Range Status   MRSA by PCR POSITIVE (A) NEGATIVE Final    Comment:        The GeneXpert MRSA Assay (FDA approved for NASAL specimens only), is one component of a comprehensive MRSA colonization surveillance program. It is not intended to diagnose MRSA infection nor to guide or monitor treatment for MRSA infections. RESULT CALLED TO, READ BACK BY AND VERIFIED WITH: DULLA,R RN 2353 02/19/17 MITCHELL,L   Culture, blood (routine x 2)     Status: None   Collection Time: 02/20/17  5:20 AM  Result Value Ref Range Status   Specimen Description BLOOD LEFT ANTECUBITAL  Final   Special Requests   Final    BOTTLES DRAWN AEROBIC AND ANAEROBIC Blood Culture adequate volume   Culture NO GROWTH 5 DAYS  Final   Report Status 02/25/2017 FINAL  Final  Culture, blood (routine x 2)     Status: None   Collection Time: 02/20/17  5:21 AM  Result Value Ref Range Status   Specimen Description BLOOD LEFT HAND  Final   Special Requests   Final    BOTTLES DRAWN AEROBIC ONLY Blood Culture adequate volume   Culture NO GROWTH 5 DAYS  Final   Report Status 02/25/2017 FINAL  Final      Radiology Studies: No results found.   Scheduled Meds: . aspirin EC  325 mg Oral Daily  . atorvastatin  40 mg Oral q1800  . carvedilol  12.5 mg Oral 2 times per day on Sun Tue Thu Sat  . cinacalcet  30 mg Oral Q breakfast  . clopidogrel  75 mg Oral Daily  . darbepoetin (ARANESP) injection - DIALYSIS  60 mcg Intravenous Q  Fri-HD  . doxercalciferol  1.5 mcg Intravenous Q M,W,F-HD  . feeding supplement (NEPRO CARB STEADY)  237 mL Oral Q1500  . feeding supplement (PRO-STAT SUGAR FREE 64)  30 mL Oral Daily  . finasteride  5 mg Oral Daily  . heparin  5,000 Units Subcutaneous Q8H  . insulin aspart  0-5 Units Subcutaneous QHS  . insulin aspart  0-9 Units Subcutaneous TID WC  . insulin glargine  15 Units Subcutaneous QHS  . multivitamin  1 tablet Oral Q M,W,F-2000  . pantoprazole  40 mg Oral Daily  . polyethylene glycol  17 g Oral Daily  . sevelamer carbonate  2,400 mg Oral TID WC  . sodium chloride flush  3 mL Intravenous Q12H  . triamcinolone 0.1 % cream : eucerin   Topical TID   Continuous Infusions: . sodium chloride    . sodium chloride Stopped (02/23/17 2300)  . vancomycin  1,000 mg (02/24/17 1601)     LOS: 7 days    Time spent: 30 min    Janece Canterbury, MD [facility] Pager [phone]  If 7PM-7AM, please contact night-coverage [email or web address] Password [facility] 02/25/2017, 3:04 PM

## 2017-02-25 NOTE — Progress Notes (Signed)
    [place] for Infectious Disease   Reason for visit: Follow up on MRSA bacteremia  Interval History: discussion with Dr. Trula Slade noted and the patient does not want further interventions.    Physical Exam: Constitutional:  Vitals:   02/25/17 0429 02/25/17 0800  BP: (!) 132/56 126/88  Pulse: 72 72  Resp: (!) 21 12  Temp: 98.7 F (37.1 C) 97.6 F (36.4 C)   patient appears in NAD  Impression: Stable bacteremia  Plan: 1.  Plan for 6 weeks of vancomycin with dialysis if he wants further care through August 31 though overall will not improve his outcome without amputation.   I will sign off, please call with any changes. thanks

## 2017-02-25 NOTE — Progress Notes (Signed)
Subjective  -   Resting comfortably   Physical Exam:  Right leg dressing intact.  The right leg is warm to the ankle up. Nonlabored breathing       Assessment/Plan:    I discussed with the patient that he does not have any further options for revascularization.  He is transmetatarsal amputation is not healing and he needs to have this converted to a below-knee amputation.  The patient states that he has been through enough and does not wish to have any other procedures done.  He just wants to go home.  He is scheduled for palliative care meeting later today.  At this time I feel the patient is well informed and understands the risks of this.  I am canceling his operation for tomorrow with plans for palliative care.  Annamarie Major 02/25/2017 10:06 AM --  Vitals:   02/25/17 0429 02/25/17 0800  BP: (!) 132/56 126/88  Pulse: 72 72  Resp: (!) 21 12  Temp: 98.7 F (37.1 C) 97.6 F (36.4 C)    Intake/Output Summary (Last 24 hours) at 02/25/17 1006 Last data filed at 02/24/17 1706  Gross per 24 hour  Intake              240 ml  Output             3000 ml  Net            -2760 ml     Laboratory CBC    Component Value Date/Time   WBC 17.9 (H) 02/24/2017 0449   HGB 8.8 (L) 02/24/2017 0449   HCT 28.5 (L) 02/24/2017 0449   PLT 535 (H) 02/24/2017 0449    BMET    Component Value Date/Time   NA 128 (L) 02/24/2017 0002   K 3.9 02/24/2017 0002   CL 92 (L) 02/24/2017 0002   CO2 25 02/24/2017 0002   GLUCOSE 149 (H) 02/24/2017 0002   BUN 38 (H) 02/24/2017 0002   CREATININE 7.54 (H) 02/24/2017 0002   CALCIUM 8.1 (L) 02/24/2017 0002   GFRNONAA 7 (L) 02/24/2017 0002   GFRAA 8 (L) 02/24/2017 0002    COAG Lab Results  Component Value Date   INR 1.30 12/30/2016   No results found for: PTT  Antibiotics Anti-infectives    Start     Dose/Rate Route Frequency Ordered Stop   02/22/17 0928  vancomycin (VANCOCIN) 1-5 GM/200ML-% IVPB    Comments:  [name]   :  cabinet override      02/22/17 0928 02/22/17 1119   02/19/17 1200  vancomycin (VANCOCIN) IVPB 1000 mg/200 mL premix     1,000 mg 200 mL/hr over 60 Minutes Intravenous Every M-W-F (Hemodialysis) 02/18/17 2109     02/18/17 2200  cefTRIAXone (ROCEPHIN) 2 g in dextrose 5 % 50 mL IVPB  Status:  Discontinued     2 g 100 mL/hr over 30 Minutes Intravenous Every 24 hours 02/18/17 2158 02/19/17 1854   02/18/17 2200  metroNIDAZOLE (FLAGYL) IVPB 500 mg  Status:  Discontinued     500 mg 100 mL/hr over 60 Minutes Intravenous Every 8 hours 02/18/17 2158 02/19/17 1854   02/18/17 2115  clindamycin (CLEOCIN) IVPB 600 mg     600 mg 100 mL/hr over 30 Minutes Intravenous  Once 02/18/17 2101 02/18/17 2228   02/18/17 2115  ciprofloxacin (CIPRO) IVPB 400 mg     400 mg 200 mL/hr over 60 Minutes Intravenous  Once 02/18/17 2101 02/18/17 2327   02/18/17  2115  vancomycin (VANCOCIN) 2,000 mg in sodium chloride 0.9 % 500 mL IVPB     2,000 mg 250 mL/hr over 120 Minutes Intravenous  Once 02/18/17 2104 02/19/17 0149       V. Leia Alf, M.D. [facility] Office: [phone] Pager:  [phone]

## 2017-02-25 NOTE — Progress Notes (Signed)
[facility] ROUNDING NOTE   Subjective:   :76M ESRD MWF RUE AVF [place] with R TMA site infection and poor healing  Planned for BKA Friday Is quite irritable this morning and unhappy. Generally and very non specific about his complaints - "I do not like the service I am getting !"   Objective:  Vital signs in last 24 hours:  Temp:  [97.6 F (36.4 C)-98.9 F (37.2 C)] 97.6 F (36.4 C) (07/26 0800) Pulse Rate:  [69-83] 72 (07/26 0800) Resp:  [12-22] 12 (07/26 0800) BP: (94-162)/(45-88) 126/88 (07/26 0800) SpO2:  [91 %-98 %] 98 % (07/26 0800) Weight:  [268 lb 4.8 oz (121.7 kg)-275 lb 5.7 oz (124.9 kg)] 268 lb 4.8 oz (121.7 kg) (07/25 1706)  Weight change: -14.1 oz (-0.4 kg) Filed Weights   02/24/17 0352 02/24/17 1300 02/24/17 1706  Weight: 276 lb 3.8 oz (125.3 kg) 275 lb 5.7 oz (124.9 kg) 268 lb 4.8 oz (121.7 kg)    Intake/Output: I/O last 3 completed shifts: In: 480 [P.O.:480] Out: 3000 [Other:3000]   Intake/Output this shift:  No intake/output data recorded.  GEN: NAD, well appearing ENT: NCAT EYES: EOMI CV: RRR, no rub, nl s1s2 PULM: CTAB, nl wob ABD: s/t/nd' +BS SKIN: RLE bandaged, not examined, no rashes elsewhwere EXT:No LEE AVF RUE : +B/T   Basic Metabolic Panel:  Recent Labs Lab 02/18/17 1821 02/18/17 2325 02/19/17 0503 02/22/17 0310 02/24/17 0002  NA 131* 130* 134* 130* 128*  K 3.8 3.6 3.8 3.6 3.9  CL 94* 92* 94* 92* 92*  CO2 25 26 27 26 25   GLUCOSE 175* 174* 152* 71 149*  BUN 24* 25* 27* 47* 38*  CREATININE 6.23* 6.45* 6.66* 8.57* 7.54*  CALCIUM 8.5* 8.6* 8.7* 8.0* 8.1*  PHOS  --  2.6  --   --  3.5    Liver Function Tests:  Recent Labs Lab 02/18/17 1821 02/18/17 2325 02/22/17 0310 02/24/17 0002  AST 19  --  26  --   ALT 9*  --  9*  --   ALKPHOS 184*  --  193*  --   BILITOT 0.4  --  0.9  --   PROT 8.1  --  7.9  --   ALBUMIN 2.2* 2.2* 1.9* 1.9*   No results for input(s): LIPASE, AMYLASE in the last 168 hours. No  results for input(s): AMMONIA in the last 168 hours.  CBC:  Recent Labs Lab 02/18/17 1821  02/19/17 0503 02/22/17 0310 02/23/17 0346 02/24/17 0002 02/24/17 0449  WBC 20.8*  < > 21.3* 17.1* 19.8* 16.5* 17.9*  NEUTROABS 17.6*  --  18.1*  --  15.5*  --  15.2*  HGB 8.0*  < > 7.7* 6.9* 8.7* 8.7* 8.8*  HCT 26.6*  < > 25.9* 22.7* 27.8* 27.9* 28.5*  MCV 84.7  < > 84.4 80.5 81.8 82.3 82.1  PLT 353  < > 396 420* 508* 482* 535*  < > = values in this interval not displayed.  Cardiac Enzymes: No results for input(s): CKTOTAL, CKMB, CKMBINDEX, TROPONINI in the last 168 hours.  BNP: Invalid input(s): POCBNP  CBG:  Recent Labs Lab 02/24/17 0651 02/24/17 1128 02/24/17 1839 02/24/17 2101 02/25/17 0629  GLUCAP 187* 205* 166* 142* 142*    Microbiology: Results for orders placed or performed during the hospital encounter of 02/18/17  Culture, blood (routine x 2)     Status: Abnormal   Collection Time: 02/18/17  6:21 PM  Result Value Ref Range Status  Specimen Description BLOOD LEFT HAND  Final   Special Requests IN PEDIATRIC BOTTLE Blood Culture adequate volume  Final   Culture  Setup Time   Final    GRAM POSITIVE COCCI IN CLUSTERS AEROBIC BOTTLE ONLY CRITICAL RESULT CALLED TO, READ BACK BY AND VERIFIED WITH: L. Huntley Dec.D. 15:30 02/19/17 (wilsonm)    Culture (A)  Final    STAPHYLOCOCCUS AUREUS SUSCEPTIBILITIES PERFORMED ON PREVIOUS CULTURE WITHIN THE LAST 5 DAYS.    Report Status 02/21/2017 FINAL  Final  Blood Culture ID Panel (Reflexed)     Status: Abnormal   Collection Time: 02/18/17  6:21 PM  Result Value Ref Range Status   Enterococcus species NOT DETECTED NOT DETECTED Final   Listeria monocytogenes NOT DETECTED NOT DETECTED Final   Staphylococcus species DETECTED (A) NOT DETECTED Final    Comment: CRITICAL RESULT CALLED TO, READ BACK BY AND VERIFIED WITH: L. Huntley Dec.D. 15:30 02/19/17 (wilsonm)    Staphylococcus aureus DETECTED (A) NOT DETECTED Final     Comment: Methicillin (oxacillin)-resistant Staphylococcus aureus (MRSA). MRSA is predictably resistant to beta-lactam antibiotics (except ceftaroline). Preferred therapy is vancomycin unless clinically contraindicated. Patient requires contact precautions if  hospitalized. CRITICAL RESULT CALLED TO, READ BACK BY AND VERIFIED WITH: L. Huntley Dec.D. 15:30 02/19/17 (wilsonm)    Methicillin resistance DETECTED (A) NOT DETECTED Final    Comment: CRITICAL RESULT CALLED TO, READ BACK BY AND VERIFIED WITH: L. Huntley Dec.D. 15:30 02/19/17 (wilsonm)    Streptococcus species NOT DETECTED NOT DETECTED Final   Streptococcus agalactiae NOT DETECTED NOT DETECTED Final   Streptococcus pneumoniae NOT DETECTED NOT DETECTED Final   Streptococcus pyogenes NOT DETECTED NOT DETECTED Final   Acinetobacter baumannii NOT DETECTED NOT DETECTED Final   Enterobacteriaceae species NOT DETECTED NOT DETECTED Final   Enterobacter cloacae complex NOT DETECTED NOT DETECTED Final   Escherichia coli NOT DETECTED NOT DETECTED Final   Klebsiella oxytoca NOT DETECTED NOT DETECTED Final   Klebsiella pneumoniae NOT DETECTED NOT DETECTED Final   Proteus species NOT DETECTED NOT DETECTED Final   Serratia marcescens NOT DETECTED NOT DETECTED Final   Haemophilus influenzae NOT DETECTED NOT DETECTED Final   Neisseria meningitidis NOT DETECTED NOT DETECTED Final   Pseudomonas aeruginosa NOT DETECTED NOT DETECTED Final   Candida albicans NOT DETECTED NOT DETECTED Final   Candida glabrata NOT DETECTED NOT DETECTED Final   Candida krusei NOT DETECTED NOT DETECTED Final   Candida parapsilosis NOT DETECTED NOT DETECTED Final   Candida tropicalis NOT DETECTED NOT DETECTED Final  Culture, blood (routine x 2)     Status: Abnormal   Collection Time: 02/18/17  9:34 PM  Result Value Ref Range Status   Specimen Description BLOOD LEFT ARM  Final   Special Requests   Final    BOTTLES DRAWN AEROBIC AND ANAEROBIC Blood Culture adequate volume    Culture  Setup Time   Final    GRAM POSITIVE COCCI IN BOTH AEROBIC AND ANAEROBIC BOTTLES CRITICAL RESULT CALLED TO, READ BACK BY AND VERIFIED WITH: L. Huntley Dec.D. 15:30 02/19/17 (wilsonm)    Culture METHICILLIN RESISTANT STAPHYLOCOCCUS AUREUS (A)  Final   Report Status 02/21/2017 FINAL  Final   Organism ID, Bacteria METHICILLIN RESISTANT STAPHYLOCOCCUS AUREUS  Final      Susceptibility   Methicillin resistant staphylococcus aureus - MIC*    CIPROFLOXACIN >=8 RESISTANT Resistant     ERYTHROMYCIN >=8 RESISTANT Resistant     GENTAMICIN <=0.5 SENSITIVE Sensitive     OXACILLIN >=4 RESISTANT Resistant  TETRACYCLINE <=1 SENSITIVE Sensitive     VANCOMYCIN 1 SENSITIVE Sensitive     TRIMETH/SULFA <=10 SENSITIVE Sensitive     CLINDAMYCIN <=0.25 SENSITIVE Sensitive     RIFAMPIN <=0.5 SENSITIVE Sensitive     Inducible Clindamycin NEGATIVE Sensitive     * METHICILLIN RESISTANT STAPHYLOCOCCUS AUREUS  MRSA PCR Screening     Status: None   Collection Time: 02/18/17 11:22 PM  Result Value Ref Range Status   MRSA by PCR NEGATIVE NEGATIVE Final    Comment:        The GeneXpert MRSA Assay (FDA approved for NASAL specimens only), is one component of a comprehensive MRSA colonization surveillance program. It is not intended to diagnose MRSA infection nor to guide or monitor treatment for MRSA infections.   MRSA PCR Screening     Status: Abnormal   Collection Time: 02/18/17 11:22 PM  Result Value Ref Range Status   MRSA by PCR POSITIVE (A) NEGATIVE Final    Comment:        The GeneXpert MRSA Assay (FDA approved for NASAL specimens only), is one component of a comprehensive MRSA colonization surveillance program. It is not intended to diagnose MRSA infection nor to guide or monitor treatment for MRSA infections. RESULT CALLED TO, READ BACK BY AND VERIFIED WITH: DULLA,R RN 2353 02/19/17 MITCHELL,L   Culture, blood (routine x 2)     Status: None (Preliminary result)   Collection  Time: 02/20/17  5:20 AM  Result Value Ref Range Status   Specimen Description BLOOD LEFT ANTECUBITAL  Final   Special Requests   Final    BOTTLES DRAWN AEROBIC AND ANAEROBIC Blood Culture adequate volume   Culture NO GROWTH 4 DAYS  Final   Report Status PENDING  Incomplete  Culture, blood (routine x 2)     Status: None (Preliminary result)   Collection Time: 02/20/17  5:21 AM  Result Value Ref Range Status   Specimen Description BLOOD LEFT HAND  Final   Special Requests   Final    BOTTLES DRAWN AEROBIC ONLY Blood Culture adequate volume   Culture NO GROWTH 4 DAYS  Final   Report Status PENDING  Incomplete    Coagulation Studies: No results for input(s): LABPROT, INR in the last 72 hours.  Urinalysis: No results for input(s): COLORURINE, LABSPEC, PHURINE, GLUCOSEU, HGBUR, BILIRUBINUR, KETONESUR, PROTEINUR, UROBILINOGEN, NITRITE, LEUKOCYTESUR in the last 72 hours.  Invalid input(s): APPERANCEUR    Imaging: No results found.   Medications:   . sodium chloride    . sodium chloride Stopped (02/23/17 2300)  . vancomycin 1,000 mg (02/24/17 1601)   . aspirin EC  325 mg Oral Daily  . atorvastatin  40 mg Oral q1800  . carvedilol  12.5 mg Oral 2 times per day on Sun Tue Thu Sat  . cinacalcet  30 mg Oral Q breakfast  . clopidogrel  75 mg Oral Daily  . darbepoetin (ARANESP) injection - DIALYSIS  60 mcg Intravenous Q Fri-HD  . doxercalciferol  1.5 mcg Intravenous Q M,W,F-HD  . feeding supplement (NEPRO CARB STEADY)  237 mL Oral Q1500  . feeding supplement (PRO-STAT SUGAR FREE 64)  30 mL Oral Daily  . finasteride  5 mg Oral Daily  . heparin  5,000 Units Subcutaneous Q8H  . insulin aspart  0-5 Units Subcutaneous QHS  . insulin aspart  0-9 Units Subcutaneous TID WC  . insulin glargine  15 Units Subcutaneous QHS  . multivitamin  1 tablet Oral Q M,W,F-2000  . pantoprazole  40 mg Oral Daily  . polyethylene glycol  17 g Oral Daily  . sevelamer carbonate  2,400 mg Oral TID WC  .  sodium chloride flush  3 mL Intravenous Q12H  . triamcinolone 0.1 % cream : eucerin   Topical TID   sodium chloride, sodium chloride, acetaminophen, albuterol, alum & mag hydroxide-simeth, bisacodyl, dextromethorphan, hydrALAZINE, HYDROcodone-acetaminophen, HYDROmorphone (DILAUDID) injection, labetalol, loratadine, metoprolol tartrate, ondansetron **OR** ondansetron (ZOFRAN) IV, phenol, sevelamer carbonate, sodium chloride flush  Assessment/ Plan:  1. ESRD MWF RUE AVF  Patient refused dialysis yesterday 2. S/p R TMA 01/01/17 with poor healing and drainage, MRSA infection,  Plan BKA Friday 3. MRSA Bacteremia, neg TTE 02/20/17 no indication for TEE    Possible discitis noted 7/20 continues on vancomycin 4. Anemia; worsened with acute health issues; TSAT 9% and ferritin 909 Hb 8.7 5. Leukocytosis, neutrophilia 2/2 #2 6. DM2 7. PAD    LOS: 7 Ladan Vanderzanden W @TODAY @11 :01 AM

## 2017-02-25 NOTE — Progress Notes (Signed)
Daily Progress Note   Patient Name: Jacob [name]       Date: 02/25/2017 DOB: July 30, 1951  Age: 66 y.o. MRN#: [id number] Attending Physician: Janece Canterbury, MD Primary Care Physician: Sharilyn Sites, MD Admit Date: 02/18/2017  Reason for Consultation/Follow-up: Establishing goals of care  Subjective: Met with patient and ex-wife (per patient request) to determine plans. Patient confirms he does not want further surgeries or amputation. He states "let me go", and stated that without surgery he is aware he could die. His ex-wife confirms she could not provide the needed assistance.  He acknowledges that going home would not be safe. He is amenable to discharge to a SNF as long as it is not [facility]. He confirmed DNR/DNI. He would like to continue dialysis as long as possible and antibiotic therapy for his foot and blood infection, he would like symptomatic care as needed.  IV Dilaudid discontinued in anticipation for discharge.   Length of Stay: 7  Current Medications: Scheduled Meds:  . aspirin EC  325 mg Oral Daily  . atorvastatin  40 mg Oral q1800  . carvedilol  12.5 mg Oral 2 times per day on Sun Tue Thu Sat  . cinacalcet  30 mg Oral Q breakfast  . clopidogrel  75 mg Oral Daily  . darbepoetin (ARANESP) injection - DIALYSIS  60 mcg Intravenous Q Fri-HD  . doxercalciferol  1.5 mcg Intravenous Q M,W,F-HD  . feeding supplement (NEPRO CARB STEADY)  237 mL Oral Q1500  . feeding supplement (PRO-STAT SUGAR FREE 64)  30 mL Oral Daily  . finasteride  5 mg Oral Daily  . heparin  5,000 Units Subcutaneous Q8H  . insulin aspart  0-5 Units Subcutaneous QHS  . insulin aspart  0-9 Units Subcutaneous TID WC  . insulin glargine  15 Units Subcutaneous QHS  . multivitamin  1 tablet Oral Q M,W,F-2000    . pantoprazole  40 mg Oral Daily  . polyethylene glycol  17 g Oral Daily  . sevelamer carbonate  2,400 mg Oral TID WC  . sodium chloride flush  3 mL Intravenous Q12H  . triamcinolone 0.1 % cream : eucerin   Topical TID    Continuous Infusions: . sodium chloride    . sodium chloride Stopped (02/23/17 2300)  . vancomycin 1,000 mg (02/24/17 1601)  PRN Meds: sodium chloride, sodium chloride, acetaminophen, albuterol, alum & mag hydroxide-simeth, bisacodyl, dextromethorphan, hydrALAZINE, HYDROcodone-acetaminophen, HYDROmorphone (DILAUDID) injection, labetalol, loratadine, metoprolol tartrate, ondansetron **OR** ondansetron (ZOFRAN) IV, phenol, sevelamer carbonate, sodium chloride flush  Physical Exam  Constitutional: He is oriented to person, place, and time.  HENT:  Head: Normocephalic and atraumatic.  Eyes: EOM are normal.  Cardiovascular:  Warm and dry  Pulmonary/Chest: Effort normal.  Abdominal: He exhibits no distension.  Musculoskeletal: Normal range of motion.  Neurological: He is alert and oriented to person, place, and time.  Psychiatric: Judgment normal.            Vital Signs: BP 126/88 (BP Location: Left Arm)   Pulse 72   Temp 97.6 F (36.4 C) (Oral)   Resp 12   Ht 6' 2" (1.88 m)   Wt 121.7 kg (268 lb 4.8 oz)   SpO2 98%   BMI 34.45 kg/m  SpO2: SpO2: 98 % O2 Device: O2 Device: Not Delivered O2 Flow Rate:    Intake/output summary:  Intake/Output Summary (Last 24 hours) at 02/25/17 1214 Last data filed at 02/24/17 1706  Gross per 24 hour  Intake                0 ml  Output             3000 ml  Net            -3000 ml   LBM: Last BM Date: 02/19/17 Baseline Weight: Weight: 128.4 kg (283 lb) Most recent weight: Weight: 121.7 kg (268 lb 4.8 oz)       Palliative Assessment/Data: 30 % at best    Flowsheet Rows     Most Recent Value  Intake Tab  Referral Department  Hospitalist  Unit at Time of Referral  Med/Surg Unit  Palliative Care Primary  Diagnosis  Sepsis/Infectious Disease  Date Notified  02/24/17  Palliative Care Type  New Palliative care  Reason for referral  Clarify Goals of Care  Date of Admission  02/18/17  Date first seen by Palliative Care  02/24/17  # of days Palliative referral response time  0 Day(s)  # of days IP prior to Palliative referral  6  Clinical Assessment  Palliative Performance Scale Score  30%  Psychosocial & Spiritual Assessment  Palliative Care Outcomes      Patient Active Problem List   Diagnosis Date Noted  . DNR (do not resuscitate) 02/24/2017  . Palliative care by specialist 02/24/2017  . Right foot infection   . Normocytic anemia 02/18/2017  . Wound infection 02/18/2017  . Hyponatremia 02/18/2017  . Hypokalemia 12/28/2016  . Insulin-requiring or dependent type II diabetes mellitus ([place]) 12/28/2016  . Gangrene of foot ([place]) 12/28/2016  . Gangrene of right foot ([place]) 12/28/2016  . Hyperkalemia 04/05/2016  . Inadequate dialysis 04/05/2016  . Other complications due to renal dialysis device, implant, and graft 04/28/2013  . Subclavian vein occlusion ([facility]) 04/28/2013  . Preoperative cardiovascular examination 12/28/2012  . Mixed hyperlipidemia 06/16/2011  . Nonischemic cardiomyopathy ([place]) 11/20/2010  . End-stage renal disease on hemodialysis ([place]) 11/20/2010  . Essential hypertension, benign 11/20/2010    Palliative Care Assessment & Plan    Assessment: Patient confirms he does not want further surgeries or amputation. He states "let me go", and stated that without surgery he is aware he could die. His ex-wife confirms she could not provide the needed assistance.  He acknowledges that going home would not be safe. He is amenable to discharge  to a SNF as long as it is not [facility]. He confirmed DNR/DNI. He would like to continue dialysis as long as possible and antibiotic therapy for his foot and blood infection, he would like symptomatic care as  needed  Recommendations/Plan: SNF placement He acknowledges that going home would not be safe. He is amenable to discharge to a SNF as long as it is not [facility].    Goals of Care and Additional Recommendations: Limitations on Scope of Treatment:  He confirmed DNR/DNI. He would like to continue dialysis as long as possible and antibiotic therapy for his foot and blood infection, he would like symptomatic care as needed   Code Status:    Code Status Orders        Start     Ordered   02/25/17 0653  Do not attempt resuscitation (DNR)  Continuous    Question Answer Comment  In the event of cardiac or respiratory ARREST Do not call a "code blue"   In the event of cardiac or respiratory ARREST Do not perform Intubation, CPR, defibrillation or ACLS   In the event of cardiac or respiratory ARREST Use medication by any route, position, wound care, and other measures to relive pain and suffering. May use oxygen, suction and manual treatment of airway obstruction as needed for comfort.      02/25/17 0653    Code Status History    Date Active Date Inactive Code Status Order ID Comments User Context   02/18/2017  9:58 PM 02/25/2017  6:53 AM Full Code [id number]  Vianne Bulls, MD ED   12/28/2016  8:50 PM 01/05/2017  3:07 PM Full Code [id number]  Vianne Bulls, MD ED   10/06/2016 12:15 PM 10/06/2016  4:15 PM Full Code [id number]  [name], MD Inpatient   06/04/2016  9:11 AM 06/04/2016  1:53 PM Full Code [id number]  Conrad [place], MD Inpatient   04/06/2016  1:06 AM 04/06/2016  6:03 PM Full Code [id number]  Phillips Grout, MD Inpatient   01/24/2015  2:04 PM 01/24/2015  6:11 PM Full Code [id number]  Conrad [place], MD Inpatient       Prognosis:   Unable to determine Pending ability to continue dialysis and response to antibiotic therapy.   Discharge Planning:  SNF  Care plan was discussed with ex-wife/Margie Aloisi/main support person and patient. Dr. Sheran Fava notified of plans and social work as  well.   Thank you for allowing the Palliative Medicine Team to assist in the care of this patient.    Time In: 1120 Time Out: 1225 Total Time 65 minutes Prolonged Time Billed  no       Greater than 50%  of this time was spent counseling and coordinating care related to the above assessment and plan.  GRIFFIN, CRYSTAL C, RN  Please contact Palliative Medicine Team phone at [phone] for questions and concerns.

## 2017-02-25 NOTE — Care Management Note (Signed)
Case Management Note Original Note Created Zenon Mayo, RN 02/23/2017, 3:20 PM    Patient Details  Name: Jacob [name] MRN: [id number] Date of Birth: 1951/06/03  Subjective/Objective:   From home alone, he lives in [place] close to his sister, [name], [place], who is his support system, s/p  Failed attempted angioplasty, right anterior tibial artery.  His sister, [name] states he will have amputation done on Friday to right LE.   He has had HH but can not remember the agency's name. He has a cane, walker and a 3 n 1 at home.                   Action/Plan: NCM will follow for dc needs.   Expected Discharge Date:                  Expected Discharge Plan:  Skilled Nursing Facility  In-House Referral:  Clinical Social Work  Discharge planning Services  CM Consult  Post Acute Care Choice:  NA Choice offered to:  NA  DME Arranged:    DME Agency:     HH Arranged:    [place] Agency:     Status of Service:  Completed, signed off  If discussed at [employer] of Stay Meetings, dates discussed:    Discharge Disposition: skilled facility  Additional Comments:  02/25/17- 1630- Rashaun Curl RN, CM- pt with further options for revascularization - per vascular his TMA is not healing and pt needs BKA- however pt is declining any further procedures- OR time has been canceled- PC consulted and has met with pt for [place]. - Plan for pt will be SNF- CSW consulted- pt is HD M/W/F -[facility] - CSW following for placement needs with need for IV abx until 8/31 with HD.   Dahlia Client [place], RN 02/25/2017, 4:36 PM [phone]

## 2017-02-26 ENCOUNTER — Encounter (HOSPITAL_COMMUNITY)
Admission: EM | Disposition: A | Discharge status: Skilled Nursing Facility | Payer: Self-pay | Source: Home / Self Care | Attending: Internal Medicine

## 2017-02-26 LAB — RENAL FUNCTION PANEL
Albumin: 1.8 g/dL — ABNORMAL LOW (ref 3.5–5.0)
Anion gap: 11 (ref 5–15)
BUN: 38 mg/dL — ABNORMAL HIGH (ref 6–20)
CALCIUM: 8.2 mg/dL — AB (ref 8.9–10.3)
CO2: 26 mmol/L (ref 22–32)
CREATININE: 7.98 mg/dL — AB (ref 0.61–1.24)
Chloride: 94 mmol/L — ABNORMAL LOW (ref 101–111)
GFR calc non Af Amer: 6 mL/min — ABNORMAL LOW (ref >60)
GFR, EST AFRICAN AMERICAN: 7 mL/min — AB (ref >60)
Glucose, Bld: 146 mg/dL — ABNORMAL HIGH (ref 65–99)
Phosphorus: 3.3 mg/dL (ref 2.5–4.6)
Potassium: 4.5 mmol/L (ref 3.5–5.1)
SODIUM: 131 mmol/L — AB (ref 135–145)

## 2017-02-26 LAB — CBC
HCT: 27.6 % — ABNORMAL LOW (ref 39.0–52.0)
Hemoglobin: 8.4 g/dL — ABNORMAL LOW (ref 13.0–17.0)
MCH: 25.1 pg — AB (ref 26.0–34.0)
MCHC: 30.4 g/dL (ref 30.0–36.0)
MCV: 82.6 fL (ref 78.0–100.0)
PLATELETS: 581 10*3/uL — AB (ref 150–400)
RBC: 3.34 MIL/uL — AB (ref 4.22–5.81)
RDW: 17.8 % — AB (ref 11.5–15.5)
WBC: 16.5 10*3/uL — ABNORMAL HIGH (ref 4.0–10.5)

## 2017-02-26 LAB — GLUCOSE, CAPILLARY
GLUCOSE-CAPILLARY: 146 mg/dL — AB (ref 65–99)
GLUCOSE-CAPILLARY: 126 mg/dL — AB (ref 65–99)
Glucose-Capillary: 120 mg/dL — ABNORMAL HIGH (ref 65–99)
Glucose-Capillary: 142 mg/dL — ABNORMAL HIGH (ref 65–99)
Glucose-Capillary: 159 mg/dL — ABNORMAL HIGH (ref 65–99)

## 2017-02-26 SURGERY — AMPUTATION BELOW KNEE
Anesthesia: General | Site: Leg Lower | Laterality: Right

## 2017-02-26 MED ORDER — SODIUM CHLORIDE 0.9 % IV SOLN: 100.0000 mL | INTRAVENOUS | Status: DC | PRN

## 2017-02-26 MED ORDER — LIDOCAINE-PRILOCAINE 2.5-2.5 % EX CREA: 1.0000 "application " | TOPICAL_CREAM | CUTANEOUS | Status: DC | PRN

## 2017-02-26 MED ORDER — DOXERCALCIFEROL 4 MCG/2ML IV SOLN
INTRAVENOUS | Status: AC
  Filled 2017-02-26: qty 2

## 2017-02-26 MED ORDER — LANTUS SOLOSTAR 100 UNIT/ML ~~LOC~~ SOPN: 15.0000 [IU] | PEN_INJECTOR | Freq: Every day | SUBCUTANEOUS | 11 refills | Status: DC

## 2017-02-26 MED ORDER — NEPRO/CARBSTEADY PO LIQD: 237.0000 mL | Freq: Every day | ORAL | 0 refills | Status: DC

## 2017-02-26 MED ORDER — FINASTERIDE 5 MG PO TABS: 5.0000 mg | ORAL_TABLET | Freq: Every day | ORAL | 0 refills | Status: DC

## 2017-02-26 MED ORDER — DARBEPOETIN ALFA 60 MCG/0.3ML IJ SOSY
PREFILLED_SYRINGE | INTRAMUSCULAR | Status: AC
  Filled 2017-02-26: qty 0.3

## 2017-02-26 MED ORDER — ALTEPLASE 2 MG IJ SOLR
2.0000 mg | Freq: Once | INTRAMUSCULAR | Status: DC | PRN
  Filled 2017-02-26: qty 2

## 2017-02-26 MED ORDER — SENNA 8.6 MG PO TABS: 2.0000 | ORAL_TABLET | Freq: Every day | ORAL | 0 refills | Status: DC

## 2017-02-26 MED ORDER — POLYETHYLENE GLYCOL 3350 17 G PO PACK
34.0000 g | PACK | Freq: Two times a day (BID) | ORAL | Status: DC
  Administered 2017-02-26 – 2017-02-27 (×4): 34 g via ORAL
  Filled 2017-02-26 (×5): qty 2

## 2017-02-26 MED ORDER — ATORVASTATIN CALCIUM 40 MG PO TABS: 40.0000 mg | ORAL_TABLET | Freq: Every day | ORAL | 0 refills | Status: DC

## 2017-02-26 MED ORDER — LIDOCAINE HCL (PF) 1 % IJ SOLN: 5.0000 mL | INTRAMUSCULAR | Status: DC | PRN

## 2017-02-26 MED ORDER — PENTAFLUOROPROP-TETRAFLUOROETH EX AERO: 1.0000 "application " | INHALATION_SPRAY | CUTANEOUS | Status: DC | PRN

## 2017-02-26 MED ORDER — VANCOMYCIN HCL IN DEXTROSE 1-5 GM/200ML-% IV SOLN: 1000.0000 mg | INTRAVENOUS | 0 refills | Status: DC

## 2017-02-26 MED ORDER — HYDROCODONE-ACETAMINOPHEN 5-325 MG PO TABS: 1.0000 | ORAL_TABLET | ORAL | 0 refills | Status: DC | PRN

## 2017-02-26 MED ORDER — HYDROCODONE-ACETAMINOPHEN 5-325 MG PO TABS
ORAL_TABLET | ORAL | Status: AC
  Filled 2017-02-26: qty 1

## 2017-02-26 MED ORDER — SENNA 8.6 MG PO TABS
2.0000 | ORAL_TABLET | Freq: Every day | ORAL | Status: DC
  Administered 2017-02-26 – 2017-02-27 (×2): 17.2 mg via ORAL
  Filled 2017-02-26 (×2): qty 2

## 2017-02-26 MED ORDER — HEPARIN SODIUM (PORCINE) 1000 UNIT/ML DIALYSIS: 1000.0000 [IU] | INTRAMUSCULAR | Status: DC | PRN

## 2017-02-26 MED ORDER — BISACODYL 10 MG RE SUPP: 10.0000 mg | Freq: Once | RECTAL | Status: DC

## 2017-02-26 MED ORDER — VANCOMYCIN HCL IN DEXTROSE 1-5 GM/200ML-% IV SOLN
INTRAVENOUS | Status: AC
  Filled 2017-02-26: qty 200

## 2017-02-26 MED ORDER — POLYETHYLENE GLYCOL 3350 17 GM/SCOOP PO POWD: 17.0000 g | Freq: Every day | ORAL | 0 refills | Status: DC

## 2017-02-26 NOTE — Progress Notes (Signed)
[facility] ROUNDING NOTE   Subjective:   Interval History: 73M ESRD MWF RUE AVF [place] with R TMA site infection and poor healing Planned for BKA Friday Was refusing to come to dialysis  -- no clear reason . After discussing importance of dialysis he has agreed to come today  Objective:  Vital signs in last 24 hours:  Temp:  [97.9 F (36.6 C)-98.6 F (37 C)] 97.9 F (36.6 C) (07/27 0926) Pulse Rate:  [70-84] 77 (07/27 0408) Resp:  [16-18] 18 (07/27 0408) BP: (101-133)/(52-84) 133/63 (07/27 0408) SpO2:  [97 %-100 %] 100 % (07/27 0408) Weight:  [275 lb 5.7 oz (124.9 kg)] 275 lb 5.7 oz (124.9 kg) (07/27 0086)  Weight change: 0 lb (0 kg) Filed Weights   02/24/17 1300 02/24/17 1706 02/26/17 0633  Weight: 275 lb 5.7 oz (124.9 kg) 268 lb 4.8 oz (121.7 kg) 275 lb 5.7 oz (124.9 kg)    Intake/Output: I/O last 3 completed shifts: In: 360 [P.O.:360] Out: -    Intake/Output this shift:  No intake/output data recorded.  GEN: NAD, well appearing ENT: NCAT EYES: EOMI CV: RRR, no rub, nl s1s2 PULM: CTAB, nl wob ABD: s/t/nd' +BS SKIN: RLE bandaged, not examined, no rashes elsewhwere EXT:No LEE AVF RUE : +B/T   Basic Metabolic Panel:  Recent Labs Lab 02/22/17 0310 02/24/17 0002  NA 130* 128*  K 3.6 3.9  CL 92* 92*  CO2 26 25  GLUCOSE 71 149*  BUN 47* 38*  CREATININE 8.57* 7.54*  CALCIUM 8.0* 8.1*  PHOS  --  3.5    Liver Function Tests:  Recent Labs Lab 02/22/17 0310 02/24/17 0002  AST 26  --   ALT 9*  --   ALKPHOS 193*  --   BILITOT 0.9  --   PROT 7.9  --   ALBUMIN 1.9* 1.9*   No results for input(s): LIPASE, AMYLASE in the last 168 hours. No results for input(s): AMMONIA in the last 168 hours.  CBC:  Recent Labs Lab 02/22/17 0310 02/23/17 0346 02/24/17 0002 02/24/17 0449  WBC 17.1* 19.8* 16.5* 17.9*  NEUTROABS  --  15.5*  --  15.2*  HGB 6.9* 8.7* 8.7* 8.8*  HCT 22.7* 27.8* 27.9* 28.5*  MCV 80.5 81.8 82.3 82.1  PLT 420*  508* 482* 535*    Cardiac Enzymes: No results for input(s): CKTOTAL, CKMB, CKMBINDEX, TROPONINI in the last 168 hours.  BNP: Invalid input(s): POCBNP  CBG:  Recent Labs Lab 02/25/17 0629 02/25/17 1131 02/25/17 1641 02/25/17 2205 02/26/17 0601  GLUCAP 142* 164* 190* 186* 120*    Microbiology: Results for orders placed or performed during the hospital encounter of 02/18/17  Culture, blood (routine x 2)     Status: Abnormal   Collection Time: 02/18/17  6:21 PM  Result Value Ref Range Status   Specimen Description BLOOD LEFT HAND  Final   Special Requests IN PEDIATRIC BOTTLE Blood Culture adequate volume  Final   Culture  Setup Time   Final    GRAM POSITIVE COCCI IN CLUSTERS AEROBIC BOTTLE ONLY CRITICAL RESULT CALLED TO, READ BACK BY AND VERIFIED WITH: L. Huntley Dec.D. 15:30 02/19/17 (wilsonm)    Culture (A)  Final    STAPHYLOCOCCUS AUREUS SUSCEPTIBILITIES PERFORMED ON PREVIOUS CULTURE WITHIN THE LAST 5 DAYS.    Report Status 02/21/2017 FINAL  Final  Blood Culture ID Panel (Reflexed)     Status: Abnormal   Collection Time: 02/18/17  6:21 PM  Result Value Ref Range Status  Enterococcus species NOT DETECTED NOT DETECTED Final   Listeria monocytogenes NOT DETECTED NOT DETECTED Final   Staphylococcus species DETECTED (A) NOT DETECTED Final    Comment: CRITICAL RESULT CALLED TO, READ BACK BY AND VERIFIED WITH: L. Huntley Dec.D. 15:30 02/19/17 ([email or web address])    Staphylococcus aureus DETECTED (A) NOT DETECTED Final    Comment: Methicillin (oxacillin)-resistant Staphylococcus aureus (MRSA). MRSA is predictably resistant to beta-lactam antibiotics (except ceftaroline). Preferred therapy is vancomycin unless clinically contraindicated. Patient requires contact precautions if  hospitalized. CRITICAL RESULT CALLED TO, READ BACK BY AND VERIFIED WITH: L. Huntley Dec.D. 15:30 02/19/17 (wilsonm)    Methicillin resistance DETECTED (A) NOT DETECTED Final    Comment: CRITICAL RESULT  CALLED TO, READ BACK BY AND VERIFIED WITH: L. Huntley Dec.D. 15:30 02/19/17 (wilsonm)    Streptococcus species NOT DETECTED NOT DETECTED Final   Streptococcus agalactiae NOT DETECTED NOT DETECTED Final   Streptococcus pneumoniae NOT DETECTED NOT DETECTED Final   Streptococcus pyogenes NOT DETECTED NOT DETECTED Final   Acinetobacter baumannii NOT DETECTED NOT DETECTED Final   Enterobacteriaceae species NOT DETECTED NOT DETECTED Final   Enterobacter cloacae complex NOT DETECTED NOT DETECTED Final   Escherichia coli NOT DETECTED NOT DETECTED Final   Klebsiella oxytoca NOT DETECTED NOT DETECTED Final   Klebsiella pneumoniae NOT DETECTED NOT DETECTED Final   Proteus species NOT DETECTED NOT DETECTED Final   Serratia marcescens NOT DETECTED NOT DETECTED Final   Haemophilus influenzae NOT DETECTED NOT DETECTED Final   Neisseria meningitidis NOT DETECTED NOT DETECTED Final   Pseudomonas aeruginosa NOT DETECTED NOT DETECTED Final   Candida albicans NOT DETECTED NOT DETECTED Final   Candida glabrata NOT DETECTED NOT DETECTED Final   Candida krusei NOT DETECTED NOT DETECTED Final   Candida parapsilosis NOT DETECTED NOT DETECTED Final   Candida tropicalis NOT DETECTED NOT DETECTED Final  Culture, blood (routine x 2)     Status: Abnormal   Collection Time: 02/18/17  9:34 PM  Result Value Ref Range Status   Specimen Description BLOOD LEFT ARM  Final   Special Requests   Final    BOTTLES DRAWN AEROBIC AND ANAEROBIC Blood Culture adequate volume   Culture  Setup Time   Final    GRAM POSITIVE COCCI IN BOTH AEROBIC AND ANAEROBIC BOTTLES CRITICAL RESULT CALLED TO, READ BACK BY AND VERIFIED WITH: L. Huntley Dec.D. 15:30 02/19/17 (wilsonm)    Culture METHICILLIN RESISTANT STAPHYLOCOCCUS AUREUS (A)  Final   Report Status 02/21/2017 FINAL  Final   Organism ID, Bacteria METHICILLIN RESISTANT STAPHYLOCOCCUS AUREUS  Final      Susceptibility   Methicillin resistant staphylococcus aureus - MIC*     CIPROFLOXACIN >=8 RESISTANT Resistant     ERYTHROMYCIN >=8 RESISTANT Resistant     GENTAMICIN <=0.5 SENSITIVE Sensitive     OXACILLIN >=4 RESISTANT Resistant     TETRACYCLINE <=1 SENSITIVE Sensitive     VANCOMYCIN 1 SENSITIVE Sensitive     TRIMETH/SULFA <=10 SENSITIVE Sensitive     CLINDAMYCIN <=0.25 SENSITIVE Sensitive     RIFAMPIN <=0.5 SENSITIVE Sensitive     Inducible Clindamycin NEGATIVE Sensitive     * METHICILLIN RESISTANT STAPHYLOCOCCUS AUREUS  MRSA PCR Screening     Status: None   Collection Time: 02/18/17 11:22 PM  Result Value Ref Range Status   MRSA by PCR NEGATIVE NEGATIVE Final    Comment:        The GeneXpert MRSA Assay (FDA approved for NASAL specimens only), is one component of a comprehensive MRSA colonization  surveillance program. It is not intended to diagnose MRSA infection nor to guide or monitor treatment for MRSA infections.   MRSA PCR Screening     Status: Abnormal   Collection Time: 02/18/17 11:22 PM  Result Value Ref Range Status   MRSA by PCR POSITIVE (A) NEGATIVE Final    Comment:        The GeneXpert MRSA Assay (FDA approved for NASAL specimens only), is one component of a comprehensive MRSA colonization surveillance program. It is not intended to diagnose MRSA infection nor to guide or monitor treatment for MRSA infections. RESULT CALLED TO, READ BACK BY AND VERIFIED WITH: DULLA,R RN 2353 02/19/17 MITCHELL,L   Culture, blood (routine x 2)     Status: None   Collection Time: 02/20/17  5:20 AM  Result Value Ref Range Status   Specimen Description BLOOD LEFT ANTECUBITAL  Final   Special Requests   Final    BOTTLES DRAWN AEROBIC AND ANAEROBIC Blood Culture adequate volume   Culture NO GROWTH 5 DAYS  Final   Report Status 02/25/2017 FINAL  Final  Culture, blood (routine x 2)     Status: None   Collection Time: 02/20/17  5:21 AM  Result Value Ref Range Status   Specimen Description BLOOD LEFT HAND  Final   Special Requests   Final     BOTTLES DRAWN AEROBIC ONLY Blood Culture adequate volume   Culture NO GROWTH 5 DAYS  Final   Report Status 02/25/2017 FINAL  Final    Coagulation Studies: No results for input(s): LABPROT, INR in the last 72 hours.  Urinalysis: No results for input(s): COLORURINE, LABSPEC, PHURINE, GLUCOSEU, HGBUR, BILIRUBINUR, KETONESUR, PROTEINUR, UROBILINOGEN, NITRITE, LEUKOCYTESUR in the last 72 hours.  Invalid input(s): APPERANCEUR    Imaging: No results found.   Medications:   . sodium chloride    . sodium chloride Stopped (02/23/17 2300)  . vancomycin 1,000 mg (02/24/17 1601)   . aspirin EC  325 mg Oral Daily  . atorvastatin  40 mg Oral q1800  . bisacodyl  10 mg Rectal Once  . carvedilol  12.5 mg Oral 2 times per day on Sun Tue Thu Sat  . cinacalcet  30 mg Oral Q breakfast  . clopidogrel  75 mg Oral Daily  . darbepoetin (ARANESP) injection - DIALYSIS  60 mcg Intravenous Q Fri-HD  . doxercalciferol  1.5 mcg Intravenous Q M,W,F-HD  . feeding supplement (NEPRO CARB STEADY)  237 mL Oral Q1500  . feeding supplement (PRO-STAT SUGAR FREE 64)  30 mL Oral Daily  . finasteride  5 mg Oral Daily  . heparin  5,000 Units Subcutaneous Q8H  . insulin aspart  0-5 Units Subcutaneous QHS  . insulin aspart  0-9 Units Subcutaneous TID WC  . insulin glargine  15 Units Subcutaneous QHS  . multivitamin  1 tablet Oral Q M,W,F-2000  . pantoprazole  40 mg Oral Daily  . polyethylene glycol  34 g Oral BID  . senna  2 tablet Oral QHS  . sevelamer carbonate  2,400 mg Oral TID WC  . sodium chloride flush  3 mL Intravenous Q12H  . triamcinolone 0.1 % cream : eucerin   Topical TID   sodium chloride, sodium chloride, acetaminophen, albuterol, alum & mag hydroxide-simeth, bisacodyl, dextromethorphan, hydrALAZINE, HYDROcodone-acetaminophen, labetalol, loratadine, metoprolol tartrate, ondansetron **OR** ondansetron (ZOFRAN) IV, phenol, sevelamer carbonate, sodium chloride flush  Assessment/ Plan:  1. ESRD MWF RUE  AVF Refusing dialysis   - discussed with patient   -- consider psychiatry  consult  2. S/p R TMA 01/01/17 with poor healing and drainage, MRSA infection, Plan BKA Friday 3. MRSA Bacteremia, neg TTE 02/20/17 no indication for TEE    Possible discitis noted 7/20 continues on vancomycin 4. Anemia; worsened with acute health issues; TSAT 9% and ferritin 909 Hb 8.7 5. Leukocytosis, neutrophilia 2/2 #2 6. DM2 7. PAD     LOS: 8 Jacob [name] W @TODAY @10 :54 AM

## 2017-02-26 NOTE — Consult Note (Addendum)
   [facility] CM Inpatient Consult   02/26/2017  [name] 1951/03/23 [id number]    [facility] Care Management follow up.   Chart reviewed. He is currently off unit in HD. Appears discharge plan is for SNF.  Noted palliative consult was done.   Will update [facility] Community team.   Marthenia Rolling, [place], RN,BSN [facility] Liaison [phone]

## 2017-02-26 NOTE — Discharge Summary (Signed)
Physician Discharge Summary  Jacob [name] HQP:[id number] DOB: 1950-10-27 DOA: 02/18/2017  PCP: Sharilyn Sites, MD  Admit date: 02/18/2017 Discharge date: 02/26/2017  Admitted From: home  Disposition:  SNF  Recommendations for Outpatient Follow-up:  1. Hemodialysis on Monday, Wednesday, Friday schedule 2. Continue vancomycin for 6 weeks, last day on August 31 3. Palliative care consultation  Home Health:  PT/OT  Equipment/Devices:  Wet to dry dressings of the right foot  Discharge Condition:  Stable, improved CODE STATUS:  DNR  Diet recommendation:  Renal/dialysis   Brief/Interim Summary:  The patient is a 66 year old male resident of the [facility] with diabetes mellitus, ESRD, hypertension, previous CVA, peripheral arterial disease who presented with a wound infection.  He recently underwent revascularization of the right lower extremity and had a transmetatarsal amputation performed on 01/01/2017 of the right foot. He was seen at the vascular office on July 17 and was found to have poor wound healing. He came to the emergency department on 7/19 with wound dehiscence and a marked leukocytosis. Vascular surgery was consulted.  The patient underwent angiography on 7/24. Unfortunately, the vascular surgeon was unable to perform angioplasty of the right anterior tibial artery and he had no surgical targets.  Dr. Trula Slade recommended a right BKA for source control as the patient has been found to have MRSA bacteremia.  The patient declined amputation and preferred to continue antibiotics with the understanding that he may eventually succumbed to his infection and he will never be cured. He met with palliative care who assisted with pain management. The patient is now DO NOT RESUSCITATE. He has elected to continue hemodialysis for now.    Discharge Diagnoses:  Principal Problem:   Wound infection Active Problems:   End-stage renal disease on hemodialysis ([place])   Essential hypertension,  benign   Insulin-requiring or dependent type II diabetes mellitus ([place])   Normocytic anemia   Hyponatremia   Right foot infection   DNR (do not resuscitate)   Palliative care by specialist  Infected right nonhealing transmetatarsal amputation wound with dehiscence, likely the source of his MRSA bacteremia.  He was seen by vascular surgery and by palliative care.   -  Patient declines right BKA -  Continue vancomycin per pharmacy and will need vancomycin at HD after discharge -  Palliative care to continue to follow post-discharge  MRSA bacteremia, likely secondary to his forefoot infection - MRI in 7/20 suggested possible discitis, however his back pain has improved and he will already be receiving a long course of vancomycin - Continue vancomycin for 6 weeks, last day on August 31 - ECHO: No evidence of endocarditis - ID consult appreciated  Toxic metabolic encephalopathy secondary to sepsis and sedating medications, resolved  End-stage renal disease, normally a Monday Wednesday Friday schedule. Hyponatremia and secondary hyperparathyroidism related. - Appreciate nephrology assistance  Acute blood loss anemia superimposed on anemia of chronic disease - Transfused 2 units of pack red blood cells on 7/23 - Receiving iron and EPO per nephrology  History of CVA, stable, continue Plavix and aspirin  Nonischemic cardiomyopathy, EF was 55-60 percent on 03/07/2015, volume control at dialysis  Hypertension with intermittent hypotension - Continue Coreg - Stop lisinopril  Possible seizure disorder, bipolar disorder. Patient was previously on Depakote but had not taking the medication in 2 months. Currently being held.  Diabetes mellitus type 2 with peripheral vascular palpitations, CBG well controlled.  A1c 7.7 in May 2018 - Continue Lantus 15units daily at bedtime - Continue sliding  scale insulin  Persistent leukocytosis secondary to MRSA bacteremia and  osteomyelitis of his foot  Discharge Instructions     Medication List    STOP taking these medications   divalproex 500 MG DR tablet Commonly known as:  DEPAKOTE   lisinopril 20 MG tablet Commonly known as:  PRINIVIL,ZESTRIL   loratadine 10 MG tablet Commonly known as:  CLARITIN   oxyCODONE-acetaminophen 5-325 MG tablet Commonly known as:  PERCOCET/ROXICET   ramipril 10 MG capsule Commonly known as:  ALTACE   ROBITUSSIN 12 HOUR COUGH 30 MG/5ML liquid Generic drug:  dextromethorphan     TAKE these medications   acetaminophen 500 MG tablet Commonly known as:  TYLENOL Take 1,000 mg by mouth every 6 (six) hours as needed for mild pain.   aspirin EC 325 MG tablet Take 325 mg by mouth daily.   atorvastatin 40 MG tablet Commonly known as:  LIPITOR Take 1 tablet (40 mg total) by mouth daily at 6 PM. What changed:  when to take this   bisacodyl 5 MG EC tablet Commonly known as:  DULCOLAX Take 2 tablets (10 mg total) by mouth daily as needed for moderate constipation.   cinacalcet 30 MG tablet Commonly known as:  SENSIPAR Take 30 mg by mouth daily.   clopidogrel 75 MG tablet Commonly known as:  PLAVIX Take 1 tablet (75 mg total) by mouth daily with breakfast.   DIALYVITE TABLET Tabs Take 1 tablet by mouth every Monday, Wednesday, and Friday.   EPIPEN 2-PAK 0.3 mg/0.3 mL Soaj injection Generic drug:  EPINEPHrine Inject 0.3 mg into the muscle once as needed (For anaphylaxis.).   feeding supplement (NEPRO CARB STEADY) Liqd Take 237 mLs by mouth daily at 3 pm.   finasteride 5 MG tablet Commonly known as:  PROSCAR Take 1 tablet (5 mg total) by mouth daily.   HYDROcodone-acetaminophen 5-325 MG tablet Commonly known as:  NORCO/VICODIN Take 1 tablet by mouth every 4 (four) hours as needed for moderate pain.   LANTUS SOLOSTAR 100 UNIT/ML Solostar Pen Generic drug:  Insulin Glargine Inject 15 Units into the skin at bedtime. If blood sugar is lower than 150 only  inject 20 units at bedtime What changed:  how much to take   omeprazole 40 MG capsule Commonly known as:  PRILOSEC Take 40 mg by mouth daily.   polyethylene glycol powder powder Commonly known as:  GLYCOLAX/MIRALAX Take 17 g by mouth daily.   RENVELA 800 MG tablet Generic drug:  sevelamer carbonate Take 800-2,400 mg by mouth 3 (three) times daily with meals. Take 2400 mg by mouth 3 times daily with meals and 800 mg by mouth with snacks   senna 8.6 MG Tabs tablet Commonly known as:  SENOKOT Take 2 tablets (17.2 mg total) by mouth at bedtime.   vancomycin 1-5 GM/200ML-% Soln Commonly known as:  VANCOCIN Inject 200 mLs (1,000 mg total) into the vein every Monday, Wednesday, and Friday with hemodialysis.      Follow-up Information    Sharilyn Sites, MD. Schedule an appointment as soon as possible for a visit in 2 week(s).   Specialty:  Family Medicine Contact information: [address] [place] [address] [phone]        [name], MD. Schedule an appointment as soon as possible for a visit in 2 week(s).   Specialties:  Vascular Surgery, Cardiology Contact information: [address] [place] [address] [phone]          Allergies  Allergen Reactions  .  Bee Venom Anaphylaxis  . Iodine Swelling  . Penicillins Swelling and Other (See Comments)    SWELLING REACTION UNSPECIFIED   Has patient had a PCN reaction causing immediate rash, facial/tongue/throat swelling, SOB or lightheadedness with hypotension: No Has patient had a PCN reaction causing severe rash involving mucus membranes or skin necrosis: No Has patient had a PCN reaction that required hospitalization No Has patient had a PCN reaction occurring within the last 10 years: No If all of the above answers are "NO", then may proceed with Cephalosporin use.  . Sulfadiazine Other (See Comments)    1% Silver Sulfadiazine cream causes burning over a large area of skin.     Consultations:  Nephrology  Dr. Trula Slade, VVS  Infectious disease, Dr. Linus Salmons  Palliative care   Procedures/Studies: Mr Lumbar Spine Wo Contrast  Result Date: 02/19/2017 CLINICAL DATA:  Right leg pain. Post transmetatarsal amputation right foot 01/01/2017 with wound dehiscence. Leukocytosis. ESRD. EXAM: MRI LUMBAR SPINE WITHOUT CONTRAST TECHNIQUE: Multiplanar, multisequence MR imaging of the lumbar spine was performed. No intravenous contrast was administered. COMPARISON:  Lumbar MRI 04/25/2016, 12/11/2010 FINDINGS: Image quality degraded by artifact from technical factors. Segmentation:  Normal Alignment:  Mild anterolisthesis L4-5 Vertebrae: Negative for fracture or mass. Advanced disc degeneration fatty endplate changes and spurring L5-S1 as noted on the prior study. Bone marrow is diffusely low signal on T2 likely related to chronic anemia from dialysis. Conus medullaris: Conus medullaris not well seen. There is some artifact over the spinal canal in this area. The conus appears to terminate at L1-2. Paraspinal and other soft tissues: Mild diffuse edema in the paraspinous muscles bilaterally. No psoas abscess. Disc levels: L1-2:  Negative L2-3: Small subdural 8 fusion anterior and posterior to the thecal sac. Mild disc and facet degeneration with mild spinal stenosis L3-4: Mild disc bulging and facet degeneration with moderate spinal stenosis. L4-5: Abnormal signal in the ventral and posterior epidural space with dirty epidural fat and poorly defined small posterior fluid collection. This extends from approximately mid L4 through mid L5. There was a similar process in the posterior epidural space on the prior study. This could represent a chronic infection. The right L4-5 facet joint has an infusion and is arthritic and could be a source of infection. L5-S1: Advanced disc degeneration and spondylosis causing foraminal stenosis bilaterally. Posterior epidural process on the right at S1 is new  since the prior study and could represent infection IMPRESSION: Abnormal epidural fat in the lumbar spine. Dirty fat with areas of ill-defined fluid or edema are present in the epidural space which could represent a chronic epidural infection. Possible fungal or atypical infection in this patient on dialysis. Abnormal posterior epidural process was present on MRI of 04/25/2016 with some progression since that time and development of a new area in the posterior epidural space on the right at L1. Correlate with blood cultures. The source could be the right L4-5 facet joint. Electronically Signed   By: Franchot Gallo M.D.   On: 02/19/2017 15:36     7/24:  Aortogram with RLE run-off and failed attempt at angioplasty of the RLE on 7/24 Aortogram: No significant renal artery stenosis. No infrarenal aortic stenosis. Bilateral iliac arteries are widely patent Right Lower Extremity: The right common femoral profunda femoral and superficial femoral artery are patent without stenosis. The popliteal artery is patent throughout it's course. There is mild luminal irregularity at the curve of the anterior tibial artery. Anterior tibial is patent down to just above the  ankle where it occludes. The posterior tibial and peroneal arteries are occluded Left Lower Extremity:Not evaluated   Subjective:  Just wants to be discharged. He continues to have intermittent pains in his right foot. He is frustrated because of constipation and has not had a bowel movement in several days. Denies chest pains, difficulty breathing.  Discharge Exam: Vitals:   02/26/17 0926 02/26/17 1130  BP:  (!) 133/57  Pulse:  62  Resp:    Temp: 97.9 F (36.6 C) 97.7 F (36.5 C)   Vitals:   02/26/17 0408 02/26/17 7741 02/26/17 0926 02/26/17 1130  BP: 133/63   (!) 133/57  Pulse: 77   62  Resp: 18     Temp: 98.6 F (37 C)  97.9 F (36.6 C) 97.7 F (36.5 C)  TempSrc: Oral  Oral Oral  SpO2: 100%    100%  Weight:  124.9 kg (275 lb 5.7 oz)    Height:        General:  Adult male, no acute distress Cardiovascular: RRR, S1/S2 +, no rubs, no gallops Respiratory: CTA bilaterally, no wheezing, no rhonchi Abdominal: Soft, NT, ND, bowel sounds + Extremities:  1+ lower extremity edema of the right leg, the right foot remains bandaged, I did not remove this today for examination but it has previously had foul odor, discharge, evidence of wound dehiscence from his previous transmetatarsal indentation Neuro: Grossly moves all extremities    The results of significant diagnostics from this hospitalization (including imaging, microbiology, ancillary and laboratory) are listed below for reference.     Microbiology: Recent Results (from the past 240 hour(s))  Culture, blood (routine x 2)     Status: Abnormal   Collection Time: 02/18/17  6:21 PM  Result Value Ref Range Status   Specimen Description BLOOD LEFT HAND  Final   Special Requests IN PEDIATRIC BOTTLE Blood Culture adequate volume  Final   Culture  Setup Time   Final    GRAM POSITIVE COCCI IN CLUSTERS AEROBIC BOTTLE ONLY CRITICAL RESULT CALLED TO, READ BACK BY AND VERIFIED WITH: L. Huntley Dec.D. 15:30 02/19/17 ([email or web address])    Culture (A)  Final    STAPHYLOCOCCUS AUREUS SUSCEPTIBILITIES PERFORMED ON PREVIOUS CULTURE WITHIN THE LAST 5 DAYS.    Report Status 02/21/2017 FINAL  Final  Blood Culture ID Panel (Reflexed)     Status: Abnormal   Collection Time: 02/18/17  6:21 PM  Result Value Ref Range Status   Enterococcus species NOT DETECTED NOT DETECTED Final   Listeria monocytogenes NOT DETECTED NOT DETECTED Final   Staphylococcus species DETECTED (A) NOT DETECTED Final    Comment: CRITICAL RESULT CALLED TO, READ BACK BY AND VERIFIED WITH: L. Huntley Dec.D. 15:30 02/19/17 ([email or web address])    Staphylococcus aureus DETECTED (A) NOT DETECTED Final    Comment: Methicillin (oxacillin)-resistant Staphylococcus aureus (MRSA). MRSA is predictably  resistant to beta-lactam antibiotics (except ceftaroline). Preferred therapy is vancomycin unless clinically contraindicated. Patient requires contact precautions if  hospitalized. CRITICAL RESULT CALLED TO, READ BACK BY AND VERIFIED WITH: L. Huntley Dec.D. 15:30 02/19/17 (wilsonm)    Methicillin resistance DETECTED (A) NOT DETECTED Final    Comment: CRITICAL RESULT CALLED TO, READ BACK BY AND VERIFIED WITH: L. Huntley Dec.D. 15:30 02/19/17 (wilsonm)    Streptococcus species NOT DETECTED NOT DETECTED Final   Streptococcus agalactiae NOT DETECTED NOT DETECTED Final   Streptococcus pneumoniae NOT DETECTED NOT DETECTED Final   Streptococcus pyogenes NOT DETECTED NOT DETECTED Final   Acinetobacter baumannii NOT DETECTED NOT DETECTED Final  Enterobacteriaceae species NOT DETECTED NOT DETECTED Final   Enterobacter cloacae complex NOT DETECTED NOT DETECTED Final   Escherichia coli NOT DETECTED NOT DETECTED Final   Klebsiella oxytoca NOT DETECTED NOT DETECTED Final   Klebsiella pneumoniae NOT DETECTED NOT DETECTED Final   Proteus species NOT DETECTED NOT DETECTED Final   Serratia marcescens NOT DETECTED NOT DETECTED Final   Haemophilus influenzae NOT DETECTED NOT DETECTED Final   Neisseria meningitidis NOT DETECTED NOT DETECTED Final   Pseudomonas aeruginosa NOT DETECTED NOT DETECTED Final   Candida albicans NOT DETECTED NOT DETECTED Final   Candida glabrata NOT DETECTED NOT DETECTED Final   Candida krusei NOT DETECTED NOT DETECTED Final   Candida parapsilosis NOT DETECTED NOT DETECTED Final   Candida tropicalis NOT DETECTED NOT DETECTED Final  Culture, blood (routine x 2)     Status: Abnormal   Collection Time: 02/18/17  9:34 PM  Result Value Ref Range Status   Specimen Description BLOOD LEFT ARM  Final   Special Requests   Final    BOTTLES DRAWN AEROBIC AND ANAEROBIC Blood Culture adequate volume   Culture  Setup Time   Final    GRAM POSITIVE COCCI IN BOTH AEROBIC AND ANAEROBIC  BOTTLES CRITICAL RESULT CALLED TO, READ BACK BY AND VERIFIED WITH: L. Huntley Dec.D. 15:30 02/19/17 ([email or web address])    Culture METHICILLIN RESISTANT STAPHYLOCOCCUS AUREUS (A)  Final   Report Status 02/21/2017 FINAL  Final   Organism ID, Bacteria METHICILLIN RESISTANT STAPHYLOCOCCUS AUREUS  Final      Susceptibility   Methicillin resistant staphylococcus aureus - MIC*    CIPROFLOXACIN >=8 RESISTANT Resistant     ERYTHROMYCIN >=8 RESISTANT Resistant     GENTAMICIN <=0.5 SENSITIVE Sensitive     OXACILLIN >=4 RESISTANT Resistant     TETRACYCLINE <=1 SENSITIVE Sensitive     VANCOMYCIN 1 SENSITIVE Sensitive     TRIMETH/SULFA <=10 SENSITIVE Sensitive     CLINDAMYCIN <=0.25 SENSITIVE Sensitive     RIFAMPIN <=0.5 SENSITIVE Sensitive     Inducible Clindamycin NEGATIVE Sensitive     * METHICILLIN RESISTANT STAPHYLOCOCCUS AUREUS  MRSA PCR Screening     Status: None   Collection Time: 02/18/17 11:22 PM  Result Value Ref Range Status   MRSA by PCR NEGATIVE NEGATIVE Final    Comment:        The GeneXpert MRSA Assay (FDA approved for NASAL specimens only), is one component of a comprehensive MRSA colonization surveillance program. It is not intended to diagnose MRSA infection nor to guide or monitor treatment for MRSA infections.   MRSA PCR Screening     Status: Abnormal   Collection Time: 02/18/17 11:22 PM  Result Value Ref Range Status   MRSA by PCR POSITIVE (A) NEGATIVE Final    Comment:        The GeneXpert MRSA Assay (FDA approved for NASAL specimens only), is one component of a comprehensive MRSA colonization surveillance program. It is not intended to diagnose MRSA infection nor to guide or monitor treatment for MRSA infections. RESULT CALLED TO, READ BACK BY AND VERIFIED WITH: DULLA,R RN 2353 02/19/17 MITCHELL,L   Culture, blood (routine x 2)     Status: None   Collection Time: 02/20/17  5:20 AM  Result Value Ref Range Status   Specimen Description BLOOD LEFT ANTECUBITAL  Final    Special Requests   Final    BOTTLES DRAWN AEROBIC AND ANAEROBIC Blood Culture adequate volume   Culture NO GROWTH 5 DAYS  Final   Report  Status 02/25/2017 FINAL  Final  Culture, blood (routine x 2)     Status: None   Collection Time: 02/20/17  5:21 AM  Result Value Ref Range Status   Specimen Description BLOOD LEFT HAND  Final   Special Requests   Final    BOTTLES DRAWN AEROBIC ONLY Blood Culture adequate volume   Culture NO GROWTH 5 DAYS  Final   Report Status 02/25/2017 FINAL  Final     Labs: BNP (last 3 results) No results for input(s): BNP in the last 8760 hours. Basic Metabolic Panel:  Recent Labs Lab 02/22/17 0310 02/24/17 0002  NA 130* 128*  K 3.6 3.9  CL 92* 92*  CO2 26 25  GLUCOSE 71 149*  BUN 47* 38*  CREATININE 8.57* 7.54*  CALCIUM 8.0* 8.1*  PHOS  --  3.5   Liver Function Tests:  Recent Labs Lab 02/22/17 0310 02/24/17 0002  AST 26  --   ALT 9*  --   ALKPHOS 193*  --   BILITOT 0.9  --   PROT 7.9  --   ALBUMIN 1.9* 1.9*   No results for input(s): LIPASE, AMYLASE in the last 168 hours. No results for input(s): AMMONIA in the last 168 hours. CBC:  Recent Labs Lab 02/22/17 0310 02/23/17 0346 02/24/17 0002 02/24/17 0449  WBC 17.1* 19.8* 16.5* 17.9*  NEUTROABS  --  15.5*  --  15.2*  HGB 6.9* 8.7* 8.7* 8.8*  HCT 22.7* 27.8* 27.9* 28.5*  MCV 80.5 81.8 82.3 82.1  PLT 420* 508* 482* 535*   Cardiac Enzymes: No results for input(s): CKTOTAL, CKMB, CKMBINDEX, TROPONINI in the last 168 hours. BNP: Invalid input(s): POCBNP CBG:  Recent Labs Lab 02/25/17 1641 02/25/17 2205 02/26/17 0601 02/26/17 1001 02/26/17 1126  GLUCAP 190* 186* 120* 142* 146*   D-Dimer No results for input(s): DDIMER in the last 72 hours. Hgb A1c No results for input(s): HGBA1C in the last 72 hours. Lipid Profile No results for input(s): CHOL, HDL, LDLCALC, TRIG, CHOLHDL, LDLDIRECT in the last 72 hours. Thyroid function studies No results for input(s): TSH,  T4TOTAL, T3FREE, THYROIDAB in the last 72 hours.  Invalid input(s): FREET3 Anemia work up No results for input(s): VITAMINB12, FOLATE, FERRITIN, TIBC, IRON, RETICCTPCT in the last 72 hours. Urinalysis No results found for: COLORURINE, APPEARANCEUR, LABSPEC, Echo, GLUCOSEU, HGBUR, BILIRUBINUR, KETONESUR, PROTEINUR, UROBILINOGEN, NITRITE, LEUKOCYTESUR Sepsis Labs Invalid input(s): PROCALCITONIN,  WBC,  LACTICIDVEN   Time coordinating discharge: Over 30 minutes  SIGNED:   Janece Canterbury, MD  [facility] 02/26/2017, 1:04 PM Pager   If 7PM-7AM, please contact night-coverage [email or web address] Password [email or web address]

## 2017-02-26 NOTE — Care Management Note (Signed)
Case Management Note Original Note Created Zenon Mayo, RN 02/23/2017, 3:20 PM    Patient Details  Name: Jacob [name] MRN: [id number] Date of Birth: Aug 23, 1950  Subjective/Objective:   From home alone, he lives in [place] close to his sister, [name], [place], who is his support system, s/p  Failed attempted angioplasty, right anterior tibial artery.  His sister, [name] states he will have amputation done on Friday to right LE.   He has had HH but can not remember the agency's name. He has a cane, walker and a 3 n 1 at home.                   Action/Plan: NCM will follow for dc needs.   Expected Discharge Date:  02/26/17               Expected Discharge Plan:  Skilled Nursing Facility  In-House Referral:  Clinical Social Work  Discharge planning Services  CM Consult  Post Acute Care Choice:  NA Choice offered to:  NA  DME Arranged:    DME Agency:     HH Arranged:    [place] Agency:     Status of Service:  Completed, signed off  If discussed at [employer], dates discussed:    Discharge Disposition: skilled facility  Additional Comments:  02/26/17- 1430- Shivon Hackel RN, CM- pt for d/c to SNF today- CSW following for placement needs.  02/25/17- 1630- Raydel Hosick RN, CM- pt with further options for revascularization - per vascular his TMA is not healing and pt needs BKA- however pt is declining any further procedures- OR time has been canceled- PC consulted and has met with pt for GOC. - Plan for pt will be SNF- CSW consulted- pt is HD M/W/F -[facility] - CSW following for placement needs with need for IV abx until 8/31 with HD.   Dahlia Client Roslyn, RN 02/26/2017, 2:37 PM [phone]

## 2017-02-27 LAB — GLUCOSE, CAPILLARY
GLUCOSE-CAPILLARY: 181 mg/dL — AB (ref 65–99)
GLUCOSE-CAPILLARY: 178 mg/dL — AB (ref 65–99)
Glucose-Capillary: 153 mg/dL — ABNORMAL HIGH (ref 65–99)
Glucose-Capillary: 147 mg/dL — ABNORMAL HIGH (ref 65–99)

## 2017-02-27 MED ORDER — BISACODYL 10 MG RE SUPP
10.0000 mg | Freq: Once | RECTAL | Status: AC
  Administered 2017-02-27: 10 mg via RECTAL
  Filled 2017-02-27: qty 1

## 2017-02-27 MED ORDER — NALOXEGOL OXALATE 12.5 MG PO TABS
12.5000 mg | ORAL_TABLET | Freq: Once | ORAL | Status: AC
  Administered 2017-02-27: 12.5 mg via ORAL
  Filled 2017-02-27: qty 1

## 2017-02-27 NOTE — Progress Notes (Signed)
Daily Progress Note   Patient Name: Jacob [name]       Date: 02/27/2017 DOB: 12/25/1950  Age: 66 y.o. MRN#: [id number] Attending Physician: Janece Canterbury, MD Primary Care Physician: Sharilyn Sites, MD Admit Date: 02/18/2017  Reason for Consultation/Follow-up: goals of care   Subjective:  awake alert resting in bed His sister [name] is present at the bedside Patient and sister are asking about which SNF the patient will be going to.  See discussions below:   Length of Stay: 9  Current Medications: Scheduled Meds:  . aspirin EC  325 mg Oral Daily  . atorvastatin  40 mg Oral q1800  . carvedilol  12.5 mg Oral 2 times per day on Sun Tue Thu Sat  . cinacalcet  30 mg Oral Q breakfast  . clopidogrel  75 mg Oral Daily  . darbepoetin (ARANESP) injection - DIALYSIS  60 mcg Intravenous Q Fri-HD  . doxercalciferol  1.5 mcg Intravenous Q M,W,F-HD  . feeding supplement (NEPRO CARB STEADY)  237 mL Oral Q1500  . feeding supplement (PRO-STAT SUGAR FREE 64)  30 mL Oral Daily  . finasteride  5 mg Oral Daily  . heparin  5,000 Units Subcutaneous Q8H  . insulin aspart  0-5 Units Subcutaneous QHS  . insulin aspart  0-9 Units Subcutaneous TID WC  . insulin glargine  15 Units Subcutaneous QHS  . multivitamin  1 tablet Oral Q M,W,F-2000  . pantoprazole  40 mg Oral Daily  . polyethylene glycol  34 g Oral BID  . senna  2 tablet Oral QHS  . sevelamer carbonate  2,400 mg Oral TID WC  . sodium chloride flush  3 mL Intravenous Q12H  . triamcinolone 0.1 % cream : eucerin   Topical TID    Continuous Infusions: . sodium chloride    . sodium chloride Stopped (02/23/17 2300)  . vancomycin 1,000 mg (02/26/17 1531)    PRN Meds: sodium chloride, sodium chloride, acetaminophen, albuterol, alum & mag  hydroxide-simeth, bisacodyl, dextromethorphan, hydrALAZINE, HYDROcodone-acetaminophen, labetalol, loratadine, metoprolol tartrate, ondansetron **OR** ondansetron (ZOFRAN) IV, phenol, sevelamer carbonate, sodium chloride flush  Physical Exam         Resting in bed Awake alert Flat affect Clear breath sounds anteriorly Has scars on upper extremities Abdomen not tender S1 S2 R foot bandaged, has edema Non focal  Vital Signs: BP (!) 152/69   Pulse 66   Temp 98.1 F (36.7 C) (Oral)   Resp 18   Ht 6\' 2"  (1.88 m)   Wt 121.5 kg (267 lb 13.7 oz)   SpO2 100%   BMI 34.39 kg/m  SpO2: SpO2: 100 % O2 Device: O2 Device: Not Delivered O2 Flow Rate:    Intake/output summary:  Intake/Output Summary (Last 24 hours) at 02/27/17 1121 Last data filed at 02/27/17 0500  Gross per 24 hour  Intake            852.5 ml  Output             3000 ml  Net          -2147.5 ml   LBM: Last BM Date: 02/20/17 (gave miralax) Baseline Weight: Weight: 128.4 kg (283 lb) Most recent weight: Weight: 121.5 kg (267 lb 13.7 oz)       Palliative Assessment/Data:    Flowsheet Rows     Most Recent Value  Intake Tab  Referral Department  Hospitalist  Unit at Time of Referral  Med/Surg Unit  Palliative Care Primary Diagnosis  Sepsis/Infectious Disease  Date Notified  02/24/17  Palliative Care Type  New Palliative care  Reason for referral  Clarify Goals of Care  Date of Admission  02/18/17  Date first seen by Palliative Care  02/24/17  # of days Palliative referral response time  0 Day(s)  # of days IP prior to Palliative referral  6  Clinical Assessment  Palliative Performance Scale Score  30%  Psychosocial & Spiritual Assessment  Palliative Care Outcomes      Patient Active Problem List   Diagnosis Date Noted  . DNR (do not resuscitate) 02/24/2017  . Palliative care by specialist 02/24/2017  . Right foot infection   . Normocytic anemia 02/18/2017  . Wound infection 02/18/2017  . Hyponatremia  02/18/2017  . Hypokalemia 12/28/2016  . Insulin-requiring or dependent type II diabetes mellitus ([place]) 12/28/2016  . Gangrene of foot ([place]) 12/28/2016  . Gangrene of right foot ([place]) 12/28/2016  . Hyperkalemia 04/05/2016  . Inadequate dialysis 04/05/2016  . Other complications due to renal dialysis device, implant, and graft 04/28/2013  . Subclavian vein occlusion ([facility]) 04/28/2013  . Preoperative cardiovascular examination 12/28/2012  . Mixed hyperlipidemia 06/16/2011  . Nonischemic cardiomyopathy ([place]) 11/20/2010  . End-stage renal disease on hemodialysis ([place]) 11/20/2010  . Essential hypertension, benign 11/20/2010    Palliative Care Assessment & Plan   Patient Profile:  42M ESRD MWF RUE AVF,came from [facility] with R TMA site infection and poor healing  Patient had initially declined R BKA surgery for infected right nonhealing transmetatarsal amputation wound and source of MRSA bacteremia, he also has possible discitis,    Recommendations/Plan:   remains with intention to continue DNR, continue Dialysis.   Long discussions with patient and his sister [name] present at the bedside about decision making regarding R BKA. Patient states he now has full understanding of what is at stake, he wants to "get on with it, move forward". When asked to elaborate, he says he is referring to surgery. When asked what type of surgery,he says, 'they' re going to cut off above or below the knee or whatever." sister states she supports the patient's decision if he wants to proceed with surgery.  Agree with psych consult. Will await their recommendations.   To continue IV antibiotics for 6 weeks total.   Code Status:    Code  Status Orders        Start     Ordered   02/25/17 [phone]  Do not attempt resuscitation (DNR)  Continuous    Question Answer Comment  In the event of cardiac or respiratory ARREST Do not call a "code blue"   In the event of cardiac or respiratory ARREST Do not perform  Intubation, CPR, defibrillation or ACLS   In the event of cardiac or respiratory ARREST Use medication by any route, position, wound care, and other measures to relive pain and suffering. May use oxygen, suction and manual treatment of airway obstruction as needed for comfort.      02/25/17 0653    Code Status History    Date Active Date Inactive Code Status Order ID Comments User Context   02/18/2017  9:58 PM 02/25/2017  6:53 AM Full Code [id number]  Vianne Bulls, MD ED   12/28/2016  8:50 PM 01/05/2017  3:07 PM Full Code [id number]  Vianne Bulls, MD ED   10/06/2016 12:15 PM 10/06/2016  4:15 PM Full Code [id number]  [name], MD Inpatient   06/04/2016  9:11 AM 06/04/2016  1:53 PM Full Code [id number]  Conrad Covington, MD Inpatient   04/06/2016  1:06 AM 04/06/2016  6:03 PM Full Code [id number]  Phillips Grout, MD Inpatient   01/24/2015  2:04 PM 01/24/2015  6:11 PM Full Code [id number]  Conrad [place], MD Inpatient       Prognosis:   guarded  Discharge Planning:  Recommend palliative services follow up with him at [facility]  Care plan was discussed with  Patient, sister, Dr Sheran Fava.   Thank you for allowing the Palliative Medicine Team to assist in the care of this patient.   Time In: 10.55 Time Out: 11.30 Total Time 35 Prolonged Time Billed  no       Greater than 50%  of this time was spent counseling and coordinating care related to the above assessment and plan.  Loistine Chance, MD [phone]  Please contact Palliative Medicine Team phone at [phone] for questions and concerns.

## 2017-02-27 NOTE — Consult Note (Signed)
[facility] Face-to-Face Psychiatry Consult   Reason for Consult:  Capacity to make medical decision Referring Physician:  Dr. Sheran Fava Patient Identification: Jacob [name] MRN:  [id number] Principal Diagnosis: Wound infection Diagnosis:   Patient Active Problem List   Diagnosis Date Noted  . DNR (do not resuscitate) [Z66] 02/24/2017  . Palliative care by specialist [Z51.5] 02/24/2017  . Right foot infection [L08.9]   . Normocytic anemia [D64.9] 02/18/2017  . Wound infection [T14.8XXA, L08.9] 02/18/2017  . Hyponatremia [E87.1] 02/18/2017  . Hypokalemia [E87.6] 12/28/2016  . Insulin-requiring or dependent type II diabetes mellitus ([place]) [E11.9, Z79.4] 12/28/2016  . Gangrene of foot ([place]) [I96] 12/28/2016  . Gangrene of right foot ([place]) [I96] 12/28/2016  . Hyperkalemia [E87.5] 04/05/2016  . Inadequate dialysis [IMO0001] 04/05/2016  . Other complications due to renal dialysis device, implant, and graft [T82.898A] 04/28/2013  . Subclavian vein occlusion (HCC) [U23.B19] 04/28/2013  . Preoperative cardiovascular examination [Z01.810] 12/28/2012  . Mixed hyperlipidemia [E78.2] 06/16/2011  . Nonischemic cardiomyopathy ([place]) [I42.8] 11/20/2010  . End-stage renal disease on hemodialysis ([place]) [N18.6, Z99.2] 11/20/2010  . Essential hypertension, benign [I10] 11/20/2010    Total Time spent with patient: 45 minutes  Subjective:   Jacob [name] is a 66 y.o. male patient admitted due to wound infection.  HPI:  Patient who denies previous history of mental illness. He is a  resident of the [facility], he has history of diabetes mellitus, ESRD, hypertension, previous CVA, peripheral arterial disease and presented to the hospital with  a wound infection in his right foot. Patient is alert and oriented to time, person, place and situation. He states that a doctor recommended a right BKA for him yesterday but he declined because he was not able to come to term with losing his right foot just like that.  However, he states that he has had enough time to process his condition and besides he states that he has been having severe pain in his right and he is ready for the amputation; "I am ready for the surgery, I can no longer bear this pain, please tell those doctor to prepare me for the operation right now.'' Patient denies SI/HI, depression, delusions or psychosis. Patient states that he now has a full understanding and consequences of not taking care of the right foot infection. Mini mental status examination: 28/30  Past Psychiatric History: none reported by the patient  Risk to Self: Is patient at risk for suicide?: No Risk to Others:   Prior Inpatient Therapy:   Prior Outpatient Therapy:    Past Medical History:  Past Medical History:  Diagnosis Date  . Anemia   . Anemia in chronic kidney disease(285.21)   . Anxiety   . Arthritis   . Depression   . Dyspnea    with exertion  . ESRD (end stage renal disease) on dialysis [facility])    "MWF; [facility] [place]" (02/18/2017)  . Essential hypertension   . GERD (gastroesophageal reflux disease)   . Glaucoma   . History of blood transfusion   . Insomnia   . Nonischemic cardiomyopathy ([place])   . Stroke [facility]) 2016   - "they said it was not a stroke"  . Type 2 diabetes mellitus ([place])    Type II    Past Surgical History:  Procedure Laterality Date  . A/V SHUNTOGRAM Right 10/06/2016   Procedure: A/V Fistulagram;  Surgeon: [name], MD;  Location: [place] CV LAB;  Service: Cardiovascular;  Laterality: Right;  . ABDOMINAL AORTOGRAM  N/A 12/30/2016   Procedure: Abdominal Aortogram;  Surgeon: Waynetta Sandy, MD;  Location: [place] CV LAB;  Service: Cardiovascular;  Laterality: N/A;  . ABDOMINAL AORTOGRAM W/LOWER EXTREMITY N/A 02/23/2017   Procedure: Abdominal Aortogram w/Lower Extremity;  Surgeon: [name], MD;  Location: [place] CV LAB;  Service: Cardiovascular;  Laterality: N/A;  Rt. leg  . AV FISTULA PLACEMENT      Hx: of  . AV FISTULA PLACEMENT Right 05/04/2013   Procedure: ARTERIOVENOUS (AV) FISTULA CREATION- RIGHT ARM;  Surgeon: Conrad Spaulding, MD;  Location: [place];  Service: Vascular;  Laterality: Right;  Ultrasound guided  . AV FISTULA PLACEMENT Right 07/28/2016   Procedure: BRACHIOCEPHALIC ARTERIOVENOUS (AV) FISTULA CREATION;  Surgeon: [name], MD;  Location: [place];  Service: Vascular;  Laterality: Right;  . CATARACT EXTRACTION W/PHACO Left 07/16/2014   Procedure: CATARACT EXTRACTION PHACO AND INTRAOCULAR LENS PLACEMENT (IOC);  Surgeon: [name], MD;  Location: [facility];  Service: Ophthalmology;  Laterality: Left;  CDE:8.86  . CATARACT EXTRACTION W/PHACO Right 07/30/2014   Procedure: CATARACT EXTRACTION PHACO AND INTRAOCULAR LENS PLACEMENT (IOC);  Surgeon: [name], MD;  Location: [facility];  Service: Ophthalmology;  Laterality: Right;  CDE 8.99  . COLONOSCOPY     Hx: of  . FISTULA SUPERFICIALIZATION Right 07/28/2016   Procedure: FISTULA SUPERFICIALIZATION;  Surgeon: [name], MD;  Location: [place];  Service: Vascular;  Laterality: Right;  . FISTULA SUPERFICIALIZATION Right 10/13/2016   Procedure: BRACHIOCEPHALIC ARTERIOVENOUS FISTULA SUPERFICIALIZATION;  Surgeon: [name], MD;  Location: [place];  Service: Vascular;  Laterality: Right;  . FISTULOGRAM N/A 05/04/2013   Procedure: CENTRAL VENOGRAM;  Surgeon: Conrad Sandia, MD;  Location: [place];  Service: Vascular;  Laterality: N/A;  . INSERTION OF DIALYSIS CATHETER Right 05/04/2013   Procedure: INSERTION OF DIALYSIS CATHETER;  Surgeon: Conrad [place], MD;  Location: [place];  Service: Vascular;  Laterality: Right;  Ultrasound guided  . INSERTION OF DIALYSIS CATHETER N/A 07/28/2016   Procedure: INSERTION OF DIALYSIS CATHETER;  Surgeon: [name], MD;  Location: [place];  Service: Vascular;  Laterality: N/A;  . LIGATION OF ARTERIOVENOUS  FISTULA Left 05/04/2013   Procedure: LIGATION OF LEFT RADIAL CEPHALIC ARTERIOVENOUS   FISTULA;  Surgeon: Conrad Che[place], MD;  Location: [place];  Service: Vascular;  Laterality: Left;  Ultrasound guided  . LIGATION OF ARTERIOVENOUS  FISTULA Right 07/28/2016   Procedure: LIGATION OF RADIOCEPHALIC ARTERIOVENOUS  FISTULA;  Surgeon: [name], MD;  Location: [place];  Service: Vascular;  Laterality: Right;  . LOWER EXTREMITY ANGIOGRAPHY N/A 12/30/2016   Procedure: Lower Extremity Angiography;  Surgeon: Waynetta Sandy, MD;  Location: [place] CV LAB;  Service: Cardiovascular;  Laterality: N/A;  . LUMBAR SPINE SURGERY    . PERIPHERAL VASCULAR ATHERECTOMY Right 12/30/2016   Procedure: Peripheral Vascular Atherectomy;  Surgeon: Waynetta Sandy, MD;  Location: [place] CV LAB;  Service: Cardiovascular;  Laterality: Right;  AT and PT  . PERIPHERAL VASCULAR BALLOON ANGIOPLASTY Right 12/30/2016   Procedure: Peripheral Vascular Balloon Angioplasty;  Surgeon: Waynetta Sandy, MD;  Location: [place] CV LAB;  Service: Cardiovascular;  Laterality: Right;  PTA of PT and AT  . PERIPHERAL VASCULAR BALLOON ANGIOPLASTY  02/23/2017   Procedure: Peripheral Vascular Balloon Angioplasty;  Surgeon: [name], MD;  Location: [place] [facility];  Service: Cardiovascular;;  RT. Anterior Tib.  [name] PERIPHERAL VASCULAR CATHETERIZATION N/A 01/24/2015   Procedure: Nolon Stalls;  Surgeon: Aaron Edelman  Starlyn Skeans, MD;  Location: [place] CV [facility];  Service: Cardiovascular;  Laterality: N/A;  . PERIPHERAL VASCULAR CATHETERIZATION Right 06/04/2016   Procedure: A/V Shuntogram/Fistulagram;  Surgeon: Conrad [place], MD;  Location: [place] [facility];  Service: Cardiovascular;  Laterality: Right;  . REVISON OF ARTERIOVENOUS FISTULA Right 01/02/2014   Procedure: REVISON OF ARTERIOVENOUS FISTULA ANASTOMOSIS;  Surgeon: Conrad [place], MD;  Location: [place];  Service: Vascular;  Laterality: Right;  . REVISON OF ARTERIOVENOUS FISTULA Right 01/02/2016   Procedure: REVISION OF RADIOCEPHALIC ARTERIOVENOUS  FISTULA  with BOVINE PATCH ANGIOPLASTY RIGHT RADIAL ARTERY;  Surgeon: Mal [name], MD;  Location: [place];  Service: Vascular;  Laterality: Right;  . SHUNTOGRAM N/A 11/07/2013   Procedure: Fistulogram;  Surgeon: [name], MD;  Location: [facility] CATH LAB;  Service: Cardiovascular;  Laterality: N/A;  . TRANSMETATARSAL AMPUTATION Right 01/01/2017   Procedure: TRANSMETATARSAL AMPUTATION;  Surgeon: [name], MD;  Location: [facility] OR;  Service: Vascular;  Laterality: Right;   Family History:  Family History  Problem Relation Age of Onset  . Heart attack Brother        92  . Heart disease Brother        before age 77  . Hypertension Brother   . Heart attack Brother        69  . Heart attack Brother        56  . Diabetes Mother   . Hypertension Mother   . Heart disease Mother   . Heart disease Father   . Hypertension Father   . Other Father        amputation  . Hypertension Sister   . Heart disease Sister   . Vision loss Maternal Uncle    Family Psychiatric  History:  Social History:  History  Alcohol Use  . Yes    Comment: 1- fifth of gin a week     History  Drug Use No    Social History   Social History  . Marital status: Divorced    Spouse name: N/A  . Number of children: N/A  . Years of education: N/A   Social History Main Topics  . Smoking status: Never Smoker  . Smokeless tobacco: Never Used  . Alcohol use Yes     Comment: 1- fifth of gin a week  . Drug use: No  . Sexual activity: Yes    Birth control/ protection: None   Other Topics Concern  . None   Social History Narrative  . None   Additional Social History:    Allergies:   Allergies  Allergen Reactions  . Bee Venom Anaphylaxis  . Iodine Swelling  . Penicillins Swelling and Other (See Comments)    SWELLING REACTION UNSPECIFIED   Has patient had a PCN reaction causing immediate rash, facial/tongue/throat swelling, SOB or lightheadedness with hypotension: No Has patient had a PCN reaction  causing severe rash involving mucus membranes or skin necrosis: No Has patient had a PCN reaction that required hospitalization No Has patient had a PCN reaction occurring within the last 10 years: No If all of the above answers are "NO", then may proceed with Cephalosporin use.  . Sulfadiazine Other (See Comments)    1% Silver Sulfadiazine cream causes burning over a large area of skin.    Labs:  Results for orders placed or performed during the hospital encounter of 02/18/17 (from the past 48 hour(s))  Glucose, capillary     Status: Abnormal  Collection Time: 02/25/17  4:41 PM  Result Value Ref Range   Glucose-Capillary 190 (H) 65 - 99 mg/dL   Comment 1 Notify RN    Comment 2 Document in Chart   Glucose, capillary     Status: Abnormal   Collection Time: 02/25/17 10:05 PM  Result Value Ref Range   Glucose-Capillary 186 (H) 65 - 99 mg/dL  Glucose, capillary     Status: Abnormal   Collection Time: 02/26/17  6:01 AM  Result Value Ref Range   Glucose-Capillary 120 (H) 65 - 99 mg/dL  Glucose, capillary     Status: Abnormal   Collection Time: 02/26/17 10:01 AM  Result Value Ref Range   Glucose-Capillary 142 (H) 65 - 99 mg/dL  Glucose, capillary     Status: Abnormal   Collection Time: 02/26/17 11:26 AM  Result Value Ref Range   Glucose-Capillary 146 (H) 65 - 99 mg/dL  Renal function panel     Status: Abnormal   Collection Time: 02/26/17 12:58 PM  Result Value Ref Range   Sodium 131 (L) 135 - 145 mmol/L   Potassium 4.5 3.5 - 5.1 mmol/L   Chloride 94 (L) 101 - 111 mmol/L   CO2 26 22 - 32 mmol/L   Glucose, Bld 146 (H) 65 - 99 mg/dL   BUN 38 (H) 6 - 20 mg/dL   Creatinine, Ser 7.98 (H) 0.61 - 1.24 mg/dL   Calcium 8.2 (L) 8.9 - 10.3 mg/dL   Phosphorus 3.3 2.5 - 4.6 mg/dL   Albumin 1.8 (L) 3.5 - 5.0 g/dL   GFR calc non Af Amer 6 (L) >60 mL/min   GFR calc Af Amer 7 (L) >60 mL/min    Comment: (NOTE) The eGFR has been calculated using the CKD EPI equation. This calculation has not  been validated in all clinical situations. eGFR's persistently <60 mL/min signify possible Chronic Kidney Disease.    Anion gap 11 5 - 15  CBC     Status: Abnormal   Collection Time: 02/26/17 12:58 PM  Result Value Ref Range   WBC 16.5 (H) 4.0 - 10.5 K/uL   RBC 3.34 (L) 4.22 - 5.81 MIL/uL   Hemoglobin 8.4 (L) 13.0 - 17.0 g/dL   HCT 27.6 (L) 39.0 - 52.0 %   MCV 82.6 78.0 - 100.0 fL   MCH 25.1 (L) 26.0 - 34.0 pg   MCHC 30.4 30.0 - 36.0 g/dL   RDW 17.8 (H) 11.5 - 15.5 %   Platelets 581 (H) 150 - 400 K/uL  Glucose, capillary     Status: Abnormal   Collection Time: 02/26/17  6:15 PM  Result Value Ref Range   Glucose-Capillary 126 (H) 65 - 99 mg/dL  Glucose, capillary     Status: Abnormal   Collection Time: 02/26/17  8:52 PM  Result Value Ref Range   Glucose-Capillary 159 (H) 65 - 99 mg/dL  Glucose, capillary     Status: Abnormal   Collection Time: 02/27/17  6:13 AM  Result Value Ref Range   Glucose-Capillary 153 (H) 65 - 99 mg/dL  Glucose, capillary     Status: Abnormal   Collection Time: 02/27/17 12:15 PM  Result Value Ref Range   Glucose-Capillary 147 (H) 65 - 99 mg/dL   Comment 1 Notify RN    Comment 2 Document in Chart     Current Facility-Administered Medications  Medication Dose Route Frequency Provider Last Rate Last Dose  . 0.9 %  sodium chloride infusion  250 mL Intravenous PRN Opyd, Christia Reading  S, MD      . 0.9 %  sodium chloride infusion  500 mL Intravenous Once PRN [name], MD   Stopped at 02/23/17 2300  . acetaminophen (TYLENOL) tablet 1,000 mg  1,000 mg Oral Q6H PRN Opyd, Ilene Qua, MD   1,000 mg at 02/20/17 1732  . albuterol (PROVENTIL) (2.5 MG/3ML) 0.083% nebulizer solution 2.5 mg  2.5 mg Nebulization Q6H PRN Ritta Slot, NP      . alum & mag hydroxide-simeth (MAALOX/MYLANTA) 200-200-20 MG/5ML suspension 15-30 mL  15-30 mL Oral Q2H PRN [name], MD      . aspirin EC tablet 325 mg  325 mg Oral Daily Opyd, Ilene Qua, MD   325 mg at 02/27/17 1027  .  atorvastatin (LIPITOR) tablet 40 mg  40 mg Oral q1800 Vianne Bulls, MD   40 mg at 02/26/17 1838  . bisacodyl (DULCOLAX) EC tablet 10 mg  10 mg Oral Daily PRN Opyd, Ilene Qua, MD   10 mg at 02/25/17 2208  . carvedilol (COREG) tablet 12.5 mg  12.5 mg Oral 2 times per day on Sun Tue Thu Sat Nita Sells, MD   12.5 mg at 02/27/17 5053  . cinacalcet (SENSIPAR) tablet 30 mg  30 mg Oral Q breakfast Opyd, Ilene Qua, MD   30 mg at 02/27/17 0843  . clopidogrel (PLAVIX) tablet 75 mg  75 mg Oral Daily Opyd, Ilene Qua, MD   75 mg at 02/27/17 1027  . Darbepoetin Alfa (ARANESP) injection 60 mcg  60 mcg Intravenous Q Fri-HD [name], MD   60 mcg at 02/26/17 1531  . dextromethorphan (DELSYM) 30 MG/5ML liquid 30 mg  30 mg Oral BID PRN Opyd, Ilene Qua, MD      . doxercalciferol (HECTOROL) injection 1.5 mcg  1.5 mcg Intravenous Q M,W,F-HD Pearson Grippe B, MD   1.5 mcg at 02/26/17 1530  . feeding supplement (NEPRO CARB STEADY) liquid 237 mL  237 mL Oral Q1500 Nita Sells, MD   237 mL at 02/21/17 1638  . feeding supplement (PRO-STAT SUGAR FREE 64) liquid 30 mL  30 mL Oral Daily Nita Sells, MD   30 mL at 02/27/17 1030  . finasteride (PROSCAR) tablet 5 mg  5 mg Oral Daily Nita Sells, MD   5 mg at 02/27/17 1027  . heparin injection 5,000 Units  5,000 Units Subcutaneous Q8H Opyd, Ilene Qua, MD   5,000 Units at 02/27/17 9767  . hydrALAZINE (APRESOLINE) injection 5 mg  5 mg Intravenous Q20 Min PRN [name], MD      . HYDROcodone-acetaminophen (NORCO/VICODIN) 5-325 MG per tablet 1 tablet  1 tablet Oral Q4H PRN Gardiner Barefoot, NP   1 tablet at 02/27/17 [phone]  . insulin aspart (novoLOG) injection 0-5 Units  0-5 Units Subcutaneous QHS Opyd, Timothy S, MD      . insulin aspart (novoLOG) injection 0-9 Units  0-9 Units Subcutaneous TID WC Opyd, Ilene Qua, MD   1 Units at 02/27/17 1317  . insulin glargine (LANTUS) injection 15 Units  15 Units Subcutaneous QHS Opyd, Ilene Qua,  MD   15 Units at 02/26/17 2110  . labetalol (NORMODYNE,TRANDATE) injection 10 mg  10 mg Intravenous Q10 min PRN [name], MD      . loratadine (CLARITIN) tablet 10 mg  10 mg Oral Daily PRN Opyd, Ilene Qua, MD   10 mg at 02/23/17 0425  . metoprolol tartrate (LOPRESSOR) injection 2-5 mg  2-5 mg Intravenous Q2H  PRN [name], MD      . multivitamin (RENA-VIT) tablet 1 tablet  1 tablet Oral Q M,W,F-2000 Opyd, Ilene Qua, MD   1 tablet at 02/26/17 2109  . ondansetron (ZOFRAN) tablet 4 mg  4 mg Oral Q6H PRN Opyd, Ilene Qua, MD       Or  . ondansetron (ZOFRAN) injection 4 mg  4 mg Intravenous Q6H PRN Opyd, Ilene Qua, MD      . pantoprazole (PROTONIX) EC tablet 40 mg  40 mg Oral Daily Opyd, Ilene Qua, MD   40 mg at 02/27/17 1027  . phenol (CHLORASEPTIC) mouth spray 1 spray  1 spray Mouth/Throat PRN [name], MD      . polyethylene glycol (MIRALAX / GLYCOLAX) packet 34 g  34 g Oral BID Janece Canterbury, MD   34 g at 02/27/17 1027  . senna (SENOKOT) tablet 17.2 mg  2 tablet Oral Hettie Holstein, MD   17.2 mg at 02/26/17 2109  . sevelamer carbonate (RENVELA) tablet 2,400 mg  2,400 mg Oral TID WC Opyd, Ilene Qua, MD   2,400 mg at 02/27/17 1318  . sevelamer carbonate (RENVELA) tablet 800 mg  800 mg Oral BID PRN Opyd, Ilene Qua, MD      . sodium chloride flush (NS) 0.9 % injection 3 mL  3 mL Intravenous Q12H Opyd, Ilene Qua, MD   3 mL at 02/27/17 1040  . sodium chloride flush (NS) 0.9 % injection 3 mL  3 mL Intravenous PRN Opyd, Ilene Qua, MD      . triamcinolone 0.1 % cream : eucerin cream, 1:1   Topical TID Short, Mackenzie, MD      . vancomycin (VANCOCIN) IVPB 1000 mg/200 mL premix  1,000 mg Intravenous Q M,W,F-HD Rumbarger, Valeda Malm, RPH 200 mL/hr at 02/26/17 1531 1,000 mg at 02/26/17 1531    Musculoskeletal: Strength & Muscle Tone: within normal limits Gait & Station: unsteady Patient leans: N/A  Psychiatric Specialty Exam: Physical Exam  Psychiatric: He has a normal mood  and affect. His speech is normal and behavior is normal. Judgment and thought content normal. Cognition and memory are normal.    Review of Systems  Psychiatric/Behavioral: Negative.     Blood pressure (!) 152/69, pulse 66, temperature 98.1 F (36.7 C), temperature source Oral, resp. rate 18, height _0  (1.88 m), weight 121.5 kg (267 lb 13.7 oz), SpO2 100 %.Body mass index is 34.39 kg/m.  General Appearance: Casual  Eye Contact:  Good  Speech:  Clear and Coherent  Volume:  Normal  Mood:  Euthymic  Affect:  Appropriate  Thought Process:  Coherent  Orientation:  Full (Time, Place, and Person)  Thought Content:  Logical  Suicidal Thoughts:  No  Homicidal Thoughts:  No  Memory:  Immediate;   Good Recent;   Good Remote;   Good  Judgement:  Intact  Insight:  Present  Psychomotor Activity:  Normal  Concentration:  Concentration: Fair and Attention Span: Fair  Recall:  Good  Fund of Knowledge:  Good  Language:  Good  Akathisia:  No  Handed:  Right  AIMS (if indicated):     Assets:  Communication Skills Desire for Improvement  ADL's:  Intact  Cognition:  WNL  Sleep:   fair     Treatment Plan Summary: Patient who denies prior history of mental illness, he presents to the hospital with wound infection in his right foot. Patient declined surgery to his right foot yesterday but now acknowledged  that he has had some time to process his medical condition and with more input from other provider couple with unbearable foot pain he has to endured he is now ready to proceed with the surgery. Patient scored 28/30 on Mini mental status examination and also demonstrate adequate understanding of his condition. It is safe to say that patient has the capacity to make medical decision as at today.   Disposition: No evidence of imminent risk to self or others at present.   Supportive therapy provided about ongoing stressors.  Corena Pilgrim, MD 02/27/2017 1:52 PM

## 2017-02-27 NOTE — Progress Notes (Signed)
[facility] ROUNDING NOTE   Subjective:   Interval History: 70M ESRD MWF RUE AVF [facility] with R TMA site infection and poor healing  Patient declined surgery for infected right nonhealing transmetatarsal amputation wound and source of MRSA bacteremia    Objective:  Vital signs in last 24 hours:  Temp:  [97.7 F (36.5 C)-98.7 F (37.1 C)] 98.7 F (37.1 C) (07/27 1918) Pulse Rate:  [60-86] 86 (07/28 0529) Resp:  [18-20] 20 (07/28 0529) BP: (120-152)/(40-69) 152/69 (07/28 0529) SpO2:  [96 %-100 %] 99 % (07/28 0529) Weight:  [267 lb 13.7 oz (121.5 kg)-274 lb 0.5 oz (124.3 kg)] 267 lb 13.7 oz (121.5 kg) (07/27 1627)  Weight change: -1 lb 5.2 oz (-0.6 kg) Filed Weights   02/26/17 0633 02/26/17 1227 02/26/17 1627  Weight: 275 lb 5.7 oz (124.9 kg) 274 lb 0.5 oz (124.3 kg) 267 lb 13.7 oz (121.5 kg)    Intake/Output: I/O last 3 completed shifts: In: 852.5 [I.V.:52.5; IV Piggyback:800] Out: 3000 [Other:3000]   Intake/Output this shift:  No intake/output data recorded.  GEN: NAD, well appearing ENT: NCAT EYES: EOMI CV: RRR, no rub, nl s1s2 PULM: CTAB, nl wob ABD: s/t/nd' +BS SKIN: RLE bandaged, not examined, no rashes elsewhwere EXT:No LEE AVF RUE : +B/T   Basic Metabolic Panel:  Recent Labs Lab 02/22/17 0310 02/24/17 0002 02/26/17 1258  NA 130* 128* 131*  K 3.6 3.9 4.5  CL 92* 92* 94*  CO2 26 25 26   GLUCOSE 71 149* 146*  BUN 47* 38* 38*  CREATININE 8.57* 7.54* 7.98*  CALCIUM 8.0* 8.1* 8.2*  PHOS  --  3.5 3.3    Liver Function Tests:  Recent Labs Lab 02/22/17 0310 02/24/17 0002 02/26/17 1258  AST 26  --   --   ALT 9*  --   --   ALKPHOS 193*  --   --   BILITOT 0.9  --   --   PROT 7.9  --   --   ALBUMIN 1.9* 1.9* 1.8*   No results for input(s): LIPASE, AMYLASE in the last 168 hours. No results for input(s): AMMONIA in the last 168 hours.  CBC:  Recent Labs Lab 02/22/17 0310 02/23/17 0346 02/24/17 0002 02/24/17 0449  02/26/17 1258  WBC 17.1* 19.8* 16.5* 17.9* 16.5*  NEUTROABS  --  15.5*  --  15.2*  --   HGB 6.9* 8.7* 8.7* 8.8* 8.4*  HCT 22.7* 27.8* 27.9* 28.5* 27.6*  MCV 80.5 81.8 82.3 82.1 82.6  PLT 420* 508* 482* 535* 581*    Cardiac Enzymes: No results for input(s): CKTOTAL, CKMB, CKMBINDEX, TROPONINI in the last 168 hours.  BNP: Invalid input(s): POCBNP  CBG:  Recent Labs Lab 02/26/17 1001 02/26/17 1126 02/26/17 1815 02/26/17 2052 02/27/17 0613  GLUCAP 142* 146* 126* 159* 153*    Microbiology: Results for orders placed or performed during the hospital encounter of 02/18/17  Culture, blood (routine x 2)     Status: Abnormal   Collection Time: 02/18/17  6:21 PM  Result Value Ref Range Status   Specimen Description BLOOD LEFT HAND  Final   Special Requests IN PEDIATRIC BOTTLE Blood Culture adequate volume  Final   Culture  Setup Time   Final    GRAM POSITIVE COCCI IN CLUSTERS AEROBIC BOTTLE ONLY CRITICAL RESULT CALLED TO, READ BACK BY AND VERIFIED WITH: L. Huntley Dec.D. 15:30 02/19/17 (wilsonm)    Culture (A)  Final    STAPHYLOCOCCUS AUREUS SUSCEPTIBILITIES PERFORMED ON PREVIOUS CULTURE WITHIN  THE LAST 5 DAYS.    Report Status 02/21/2017 FINAL  Final  Blood Culture ID Panel (Reflexed)     Status: Abnormal   Collection Time: 02/18/17  6:21 PM  Result Value Ref Range Status   Enterococcus species NOT DETECTED NOT DETECTED Final   Listeria monocytogenes NOT DETECTED NOT DETECTED Final   Staphylococcus species DETECTED (A) NOT DETECTED Final    Comment: CRITICAL RESULT CALLED TO, READ BACK BY AND VERIFIED WITH: L. Huntley Dec.D. 15:30 02/19/17 ([email or web address])    Staphylococcus aureus DETECTED (A) NOT DETECTED Final    Comment: Methicillin (oxacillin)-resistant Staphylococcus aureus (MRSA). MRSA is predictably resistant to beta-lactam antibiotics (except ceftaroline). Preferred therapy is vancomycin unless clinically contraindicated. Patient requires contact precautions if   hospitalized. CRITICAL RESULT CALLED TO, READ BACK BY AND VERIFIED WITH: L. Huntley Dec.D. 15:30 02/19/17 (wilsonm)    Methicillin resistance DETECTED (A) NOT DETECTED Final    Comment: CRITICAL RESULT CALLED TO, READ BACK BY AND VERIFIED WITH: L. Huntley Dec.D. 15:30 02/19/17 ([email or web address])    Streptococcus species NOT DETECTED NOT DETECTED Final   Streptococcus agalactiae NOT DETECTED NOT DETECTED Final   Streptococcus pneumoniae NOT DETECTED NOT DETECTED Final   Streptococcus pyogenes NOT DETECTED NOT DETECTED Final   Acinetobacter baumannii NOT DETECTED NOT DETECTED Final   Enterobacteriaceae species NOT DETECTED NOT DETECTED Final   Enterobacter cloacae complex NOT DETECTED NOT DETECTED Final   Escherichia coli NOT DETECTED NOT DETECTED Final   Klebsiella oxytoca NOT DETECTED NOT DETECTED Final   Klebsiella pneumoniae NOT DETECTED NOT DETECTED Final   Proteus species NOT DETECTED NOT DETECTED Final   Serratia marcescens NOT DETECTED NOT DETECTED Final   Haemophilus influenzae NOT DETECTED NOT DETECTED Final   Neisseria meningitidis NOT DETECTED NOT DETECTED Final   Pseudomonas aeruginosa NOT DETECTED NOT DETECTED Final   Candida albicans NOT DETECTED NOT DETECTED Final   Candida glabrata NOT DETECTED NOT DETECTED Final   Candida krusei NOT DETECTED NOT DETECTED Final   Candida parapsilosis NOT DETECTED NOT DETECTED Final   Candida tropicalis NOT DETECTED NOT DETECTED Final  Culture, blood (routine x 2)     Status: Abnormal   Collection Time: 02/18/17  9:34 PM  Result Value Ref Range Status   Specimen Description BLOOD LEFT ARM  Final   Special Requests   Final    BOTTLES DRAWN AEROBIC AND ANAEROBIC Blood Culture adequate volume   Culture  Setup Time   Final    GRAM POSITIVE COCCI IN BOTH AEROBIC AND ANAEROBIC BOTTLES CRITICAL RESULT CALLED TO, READ BACK BY AND VERIFIED WITH: L. Huntley Dec.D. 15:30 02/19/17 ([email or web address])    Culture METHICILLIN RESISTANT STAPHYLOCOCCUS AUREUS (A)   Final   Report Status 02/21/2017 FINAL  Final   Organism ID, Bacteria METHICILLIN RESISTANT STAPHYLOCOCCUS AUREUS  Final      Susceptibility   Methicillin resistant staphylococcus aureus - MIC*    CIPROFLOXACIN >=8 RESISTANT Resistant     ERYTHROMYCIN >=8 RESISTANT Resistant     GENTAMICIN <=0.5 SENSITIVE Sensitive     OXACILLIN >=4 RESISTANT Resistant     TETRACYCLINE <=1 SENSITIVE Sensitive     VANCOMYCIN 1 SENSITIVE Sensitive     TRIMETH/SULFA <=10 SENSITIVE Sensitive     CLINDAMYCIN <=0.25 SENSITIVE Sensitive     RIFAMPIN <=0.5 SENSITIVE Sensitive     Inducible Clindamycin NEGATIVE Sensitive     * METHICILLIN RESISTANT STAPHYLOCOCCUS AUREUS  MRSA PCR Screening     Status: None   Collection Time: 02/18/17 11:22 PM  Result  Value Ref Range Status   MRSA by PCR NEGATIVE NEGATIVE Final    Comment:        The GeneXpert MRSA Assay (FDA approved for NASAL specimens only), is one component of a comprehensive MRSA colonization surveillance program. It is not intended to diagnose MRSA infection nor to guide or monitor treatment for MRSA infections.   MRSA PCR Screening     Status: Abnormal   Collection Time: 02/18/17 11:22 PM  Result Value Ref Range Status   MRSA by PCR POSITIVE (A) NEGATIVE Final    Comment:        The GeneXpert MRSA Assay (FDA approved for NASAL specimens only), is one component of a comprehensive MRSA colonization surveillance program. It is not intended to diagnose MRSA infection nor to guide or monitor treatment for MRSA infections. RESULT CALLED TO, READ BACK BY AND VERIFIED WITH: DULLA,R RN [id number] 02/19/17 MITCHELL,L   Culture, blood (routine x 2)     Status: None   Collection Time: 02/20/17  5:20 AM  Result Value Ref Range Status   Specimen Description BLOOD LEFT ANTECUBITAL  Final   Special Requests   Final    BOTTLES DRAWN AEROBIC AND ANAEROBIC Blood Culture adequate volume   Culture NO GROWTH 5 DAYS  Final   Report Status 02/25/2017 FINAL   Final  Culture, blood (routine x 2)     Status: None   Collection Time: 02/20/17  5:21 AM  Result Value Ref Range Status   Specimen Description BLOOD LEFT HAND  Final   Special Requests   Final    BOTTLES DRAWN AEROBIC ONLY Blood Culture adequate volume   Culture NO GROWTH 5 DAYS  Final   Report Status 02/25/2017 FINAL  Final    Coagulation Studies: No results for input(s): LABPROT, INR in the last 72 hours.  Urinalysis: No results for input(s): COLORURINE, LABSPEC, PHURINE, GLUCOSEU, HGBUR, BILIRUBINUR, KETONESUR, PROTEINUR, UROBILINOGEN, NITRITE, LEUKOCYTESUR in the last 72 hours.  Invalid input(s): APPERANCEUR    Imaging: No results found.   Medications:   . sodium chloride    . sodium chloride Stopped (02/23/17 2300)  . vancomycin 1,000 mg (02/26/17 1531)   . aspirin EC  325 mg Oral Daily  . atorvastatin  40 mg Oral q1800  . bisacodyl  10 mg Rectal Once  . carvedilol  12.5 mg Oral 2 times per day on Sun Tue Thu Sat  . cinacalcet  30 mg Oral Q breakfast  . clopidogrel  75 mg Oral Daily  . darbepoetin (ARANESP) injection - DIALYSIS  60 mcg Intravenous Q Fri-HD  . doxercalciferol  1.5 mcg Intravenous Q M,W,F-HD  . feeding supplement (NEPRO CARB STEADY)  237 mL Oral Q1500  . feeding supplement (PRO-STAT SUGAR FREE 64)  30 mL Oral Daily  . finasteride  5 mg Oral Daily  . heparin  5,000 Units Subcutaneous Q8H  . insulin aspart  0-5 Units Subcutaneous QHS  . insulin aspart  0-9 Units Subcutaneous TID WC  . insulin glargine  15 Units Subcutaneous QHS  . multivitamin  1 tablet Oral Q M,W,F-2000  . pantoprazole  40 mg Oral Daily  . polyethylene glycol  34 g Oral BID  . senna  2 tablet Oral QHS  . sevelamer carbonate  2,400 mg Oral TID WC  . sodium chloride flush  3 mL Intravenous Q12H  . triamcinolone 0.1 % cream : eucerin   Topical TID   sodium chloride, sodium chloride, acetaminophen, albuterol, alum & mag hydroxide-simeth, bisacodyl,  dextromethorphan, hydrALAZINE,  HYDROcodone-acetaminophen, labetalol, loratadine, metoprolol tartrate, ondansetron **OR** ondansetron (ZOFRAN) IV, phenol, sevelamer carbonate, sodium chloride flush  Assessment/ Plan:  1. ESRD MWF RUE AVF scheduled  2. S/p R TMA 01/01/17 with poor healing and drainage, MRSA infection, refuses BKA  3. MRSA Bacteremia, neg TTE 02/20/17 no indication for TEE Possible discitis noted 7/20 continues on vancomycin 4. Anemia; worsened with acute health issues; TSAT 9% and ferritin 909 Hb 8.7 5. Leukocytosis, neutrophilia 2/2 #2 6. DM2 7. PAD    LOS: 9 Duriel Deery W @TODAY @7 :40 AM

## 2017-02-27 NOTE — Progress Notes (Signed)
PROGRESS NOTE  Jacob [name]  CBS:[id number] DOB: February 27, 1951 DOA: 02/18/2017 PCP: Sharilyn Sites, MD  Brief Narrative:   The patient is a 66 year old male resident of the [facility] with diabetes mellitus, ESRD, hypertension, previous CVA, peripheral arterial disease who presented with a wound infection.  He recently underwent revascularization of the right lower extremity and had a transmetatarsal amputation performed on 01/01/2017 of the right foot. He was seen at the vascular office on July 17 and was found to have poor wound healing. He came to the emergency department on 7/19 with wound dehiscence and a marked leukocytosis. Vascular surgery was consulted.  The patient underwent angiography on 7/24. Unfortunately, the vascular surgeon was unable to perform angioplasty of the right anterior tibial artery and he had no surgical targets.  Dr. Trula Slade recommended a right BKA for source control as the patient has been found to have MRSA bacteremia.  The patient repeated declined amputation for three days, but changed his mind the evening of 7/27 and demanded immediate surgery.  Only a few hours prior, the medical staff had to convince him to take dialysis.  Psychiatry met with the patient and determined that he does have capacity for medical decision-making.  The surgeons have kindly agreed to post the patient for surgery on 7/29.  If he changes his mind, social work is standing by to discharge the patient to SNF and he can follow up with vascular surgery as an outpatient.    Assessment & Plan:   Principal Problem:   Wound infection Active Problems:   End-stage renal disease on hemodialysis ([place])   Essential hypertension, benign   Insulin-requiring or dependent type II diabetes mellitus ([place])   Normocytic anemia   Hyponatremia   Right foot infection   DNR (do not resuscitate)   Palliative care by specialist  Infected right nonhealing transmetatarsal amputation wound with dehiscence, likely  the source of his MRSA bacteremia.  He was seen by vascular surgery and by palliative care.   -  Patient now amenable to right BKA -  Continue vancomycin -  Palliative care to assist with pain control -  Dr. Donnetta Hutching posting for surgery on 7/29  MRSA bacteremia, likely secondary to his forefoot infection - MRI in 7/20 suggested possible discitis, however his back pain has improved and he will already be receiving a long course of vancomycin - Continue vancomycin for 4 weeks post amputation now that patient is undergoing source control.  - ECHO: No evidence of endocarditis - ID consult appreciated  Toxic metabolic encephalopathy secondary to sepsis and sedating medications, resolved  End-stage renal disease, normally a Monday Wednesday Friday schedule. Hyponatremia and secondary hyperparathyroidism related. - Appreciate nephrology assistance  Acute blood loss anemia superimposed on anemia of chronic disease - Transfused 2 units of pack red blood cells on 7/23 - Receiving iron and EPO per nephrology  History of CVA, stable, continue Plavix and aspirin  Nonischemic cardiomyopathy, EF was 55-60 percent on 03/07/2015, volume control at dialysis  Hypertension with intermittent hypotension - Continue Coreg - Consider resuming lisinopril if his blood pressure remained stable during hemodialysis  Possible seizure disorder, bipolar disorder. Patient was previously on Depakote but had not taking the medication in 2 months. Currently being held.  Diabetes mellitus type 2 with peripheral vascular palpitations, CBG well controlled.  A1c 7.7 in May 2018 - Continue Lantus 15units daily at bedtime - Continue sliding scale insulin  Persistent leukocytosis secondary to MRSA bacteremia and osteomyelitis of his foot  DVT prophylaxis:  lovenox Code Status:  DNR Family Communication:  Patient and his sister who was at bedside Disposition Plan:  For surgery tomorrow.  SNF in 2-3  days  Consultants:   Nephrology  Dr. Trula Slade, VVS  Infectious disease, Dr. Novella Olive  Procedures:   7/24:  Aortogram with RLE run-off and failed attempt at angioplasty of the RLE on 7/24 Aortogram: No significant renal artery stenosis. No infrarenal aortic stenosis. Bilateral iliac arteries are widely patent Right Lower Extremity: The right common femoral profunda femoral and superficial femoral artery are patent without stenosis. The popliteal artery is patent throughout it's course. There is mild luminal irregularity at the curve of the anterior tibial artery. Anterior tibial is patent down to just above the ankle where it occludes. The posterior tibial and peroneal arteries are occluded Left Lower Extremity:Not evaluated  Antimicrobials:  Anti-infectives    Start     Dose/Rate Route Frequency Ordered Stop   02/26/17 1449  vancomycin (VANCOCIN) 1-5 GM/200ML-% IVPB    Comments:  [name]   : cabinet override      02/26/17 1449 02/26/17 1532   02/26/17 0000  vancomycin (VANCOCIN) 1-5 GM/200ML-% SOLN     1,000 mg 200 mL/hr over 60 Minutes Intravenous Every M-W-F (Hemodialysis) 02/26/17 1304     02/22/17 0928  vancomycin (VANCOCIN) 1-5 GM/200ML-% IVPB    Comments:  [name]   : cabinet override      02/22/17 0928 02/22/17 1119   02/19/17 1200  vancomycin (VANCOCIN) IVPB 1000 mg/200 mL premix     1,000 mg 200 mL/hr over 60 Minutes Intravenous Every M-W-F (Hemodialysis) 02/18/17 2109     02/18/17 2200  cefTRIAXone (ROCEPHIN) 2 g in dextrose 5 % 50 mL IVPB  Status:  Discontinued     2 g 100 mL/hr over 30 Minutes Intravenous Every 24 hours 02/18/17 2158 02/19/17 1854   02/18/17 2200  metroNIDAZOLE (FLAGYL) IVPB 500 mg  Status:  Discontinued     500 mg 100 mL/hr over 60 Minutes Intravenous Every 8 hours 02/18/17 2158 02/19/17 1854   02/18/17 2115  clindamycin (CLEOCIN) IVPB 600 mg     600 mg 100 mL/hr over 30 Minutes Intravenous  Once 02/18/17  2101 02/18/17 2228   02/18/17 2115  ciprofloxacin (CIPRO) IVPB 400 mg     400 mg 200 mL/hr over 60 Minutes Intravenous  Once 02/18/17 2101 02/18/17 2327   02/18/17 2115  vancomycin (VANCOCIN) 2,000 mg in sodium chloride 0.9 % 500 mL IVPB     2,000 mg 250 mL/hr over 120 Minutes Intravenous  Once 02/18/17 2104 02/19/17 0149       Subjective:  Now amenable to surgery.  Wants surgery to help with pain control.  Denies chest pain, SOB.  Eating well.    Objective: Vitals:   02/26/17 1918 02/27/17 0529 02/27/17 0530 02/27/17 1030  BP: (!) 124/57 (!) 152/69 (!) 152/69   Pulse: 78 86  66  Resp: '18 20  18  ' Temp: 98.7 F (37.1 C)   98.1 F (36.7 C)  TempSrc: Oral   Oral  SpO2: 99% 99%  100%  Weight:      Height:        Intake/Output Summary (Last 24 hours) at 02/27/17 1436 Last data filed at 02/27/17 0800  Gross per 24 hour  Intake           1092.5 ml  Output             3000 ml  Net          -1907.5 ml   Filed Weights   02/26/17 3785 02/26/17 1227 02/26/17 1627  Weight: 124.9 kg (275 lb 5.7 oz) 124.3 kg (274 lb 0.5 oz) 121.5 kg (267 lb 13.7 oz)    Examination:  General exam:  Adult male, HOH.  No acute distress.  HEENT:  NCAT, MMM Respiratory system: Clear to auscultation bilaterally Cardiovascular system: Regular rate and rhythm, normal S1/S2. No murmurs, rubs, gallops or clicks.  Warm extremities Gastrointestinal system: Normal active bowel sounds, soft, nondistended, nontender. MSK:  Normal tone and bulk, 1+ edema of the right lower extremity.  Transmetatarsal indentation incision has dehisced and there is purulent drainage.   Neuro:  Grossly moves all extremities    Data Reviewed: I have personally reviewed following labs and imaging studies  CBC:  Recent Labs Lab 02/22/17 0310 02/23/17 0346 02/24/17 0002 02/24/17 0449 02/26/17 1258  WBC 17.1* 19.8* 16.5* 17.9* 16.5*  NEUTROABS  --  15.5*  --  15.2*  --   HGB 6.9* 8.7* 8.7* 8.8* 8.4*  HCT 22.7* 27.8*  27.9* 28.5* 27.6*  MCV 80.5 81.8 82.3 82.1 82.6  PLT 420* 508* 482* 535* 885*   Basic Metabolic Panel:  Recent Labs Lab 02/22/17 0310 02/24/17 0002 02/26/17 1258  NA 130* 128* 131*  K 3.6 3.9 4.5  CL 92* 92* 94*  CO2 '26 25 26  ' GLUCOSE 71 149* 146*  BUN 47* 38* 38*  CREATININE 8.57* 7.54* 7.98*  CALCIUM 8.0* 8.1* 8.2*  PHOS  --  3.5 3.3   GFR: Estimated Creatinine Clearance: 12.4 mL/min (A) (by C-G formula based on SCr of 7.98 mg/dL (H)). Liver Function Tests:  Recent Labs Lab 02/22/17 0310 02/24/17 0002 02/26/17 1258  AST 26  --   --   ALT 9*  --   --   ALKPHOS 193*  --   --   BILITOT 0.9  --   --   PROT 7.9  --   --   ALBUMIN 1.9* 1.9* 1.8*   No results for input(s): LIPASE, AMYLASE in the last 168 hours. No results for input(s): AMMONIA in the last 168 hours. Coagulation Profile: No results for input(s): INR, PROTIME in the last 168 hours. Cardiac Enzymes: No results for input(s): CKTOTAL, CKMB, CKMBINDEX, TROPONINI in the last 168 hours. BNP (last 3 results) No results for input(s): PROBNP in the last 8760 hours. HbA1C: No results for input(s): HGBA1C in the last 72 hours. CBG:  Recent Labs Lab 02/26/17 1126 02/26/17 1815 02/26/17 2052 02/27/17 0613 02/27/17 1215  GLUCAP 146* 126* 159* 153* 147*   Lipid Profile: No results for input(s): CHOL, HDL, LDLCALC, TRIG, CHOLHDL, LDLDIRECT in the last 72 hours. Thyroid Function Tests: No results for input(s): TSH, T4TOTAL, FREET4, T3FREE, THYROIDAB in the last 72 hours. Anemia Panel: No results for input(s): VITAMINB12, FOLATE, FERRITIN, TIBC, IRON, RETICCTPCT in the last 72 hours. Urine analysis: No results found for: COLORURINE, APPEARANCEUR, LABSPEC, PHURINE, GLUCOSEU, HGBUR, BILIRUBINUR, KETONESUR, PROTEINUR, UROBILINOGEN, NITRITE, LEUKOCYTESUR Sepsis Labs: '@LABRCNTIP' (procalcitonin:4,lacticidven:4)  ) Recent Results (from the past 240 hour(s))  Culture, blood (routine x 2)     Status: Abnormal    Collection Time: 02/18/17  6:21 PM  Result Value Ref Range Status   Specimen Description BLOOD LEFT HAND  Final   Special Requests IN PEDIATRIC BOTTLE Blood Culture adequate volume  Final   Culture  Setup Time   Final    GRAM POSITIVE COCCI IN CLUSTERS AEROBIC BOTTLE  ONLY CRITICAL RESULT CALLED TO, READ BACK BY AND VERIFIED WITH: L. Huntley Dec.D. 15:30 02/19/17 ([email or web address])    Culture (A)  Final    STAPHYLOCOCCUS AUREUS SUSCEPTIBILITIES PERFORMED ON PREVIOUS CULTURE WITHIN THE LAST 5 DAYS.    Report Status 02/21/2017 FINAL  Final  Blood Culture ID Panel (Reflexed)     Status: Abnormal   Collection Time: 02/18/17  6:21 PM  Result Value Ref Range Status   Enterococcus species NOT DETECTED NOT DETECTED Final   Listeria monocytogenes NOT DETECTED NOT DETECTED Final   Staphylococcus species DETECTED (A) NOT DETECTED Final    Comment: CRITICAL RESULT CALLED TO, READ BACK BY AND VERIFIED WITH: L. Huntley Dec.D. 15:30 02/19/17 ([email or web address])    Staphylococcus aureus DETECTED (A) NOT DETECTED Final    Comment: Methicillin (oxacillin)-resistant Staphylococcus aureus (MRSA). MRSA is predictably resistant to beta-lactam antibiotics (except ceftaroline). Preferred therapy is vancomycin unless clinically contraindicated. Patient requires contact precautions if  hospitalized. CRITICAL RESULT CALLED TO, READ BACK BY AND VERIFIED WITH: L. Huntley Dec.D. 15:30 02/19/17 ([email or web address])    Methicillin resistance DETECTED (A) NOT DETECTED Final    Comment: CRITICAL RESULT CALLED TO, READ BACK BY AND VERIFIED WITH: L. Huntley Dec.D. 15:30 02/19/17 ([email or web address])    Streptococcus species NOT DETECTED NOT DETECTED Final   Streptococcus agalactiae NOT DETECTED NOT DETECTED Final   Streptococcus pneumoniae NOT DETECTED NOT DETECTED Final   Streptococcus pyogenes NOT DETECTED NOT DETECTED Final   Acinetobacter baumannii NOT DETECTED NOT DETECTED Final   Enterobacteriaceae species NOT DETECTED NOT DETECTED Final    Enterobacter cloacae complex NOT DETECTED NOT DETECTED Final   Escherichia coli NOT DETECTED NOT DETECTED Final   Klebsiella oxytoca NOT DETECTED NOT DETECTED Final   Klebsiella pneumoniae NOT DETECTED NOT DETECTED Final   Proteus species NOT DETECTED NOT DETECTED Final   Serratia marcescens NOT DETECTED NOT DETECTED Final   Haemophilus influenzae NOT DETECTED NOT DETECTED Final   Neisseria meningitidis NOT DETECTED NOT DETECTED Final   Pseudomonas aeruginosa NOT DETECTED NOT DETECTED Final   Candida albicans NOT DETECTED NOT DETECTED Final   Candida glabrata NOT DETECTED NOT DETECTED Final   Candida krusei NOT DETECTED NOT DETECTED Final   Candida parapsilosis NOT DETECTED NOT DETECTED Final   Candida tropicalis NOT DETECTED NOT DETECTED Final  Culture, blood (routine x 2)     Status: Abnormal   Collection Time: 02/18/17  9:34 PM  Result Value Ref Range Status   Specimen Description BLOOD LEFT ARM  Final   Special Requests   Final    BOTTLES DRAWN AEROBIC AND ANAEROBIC Blood Culture adequate volume   Culture  Setup Time   Final    GRAM POSITIVE COCCI IN BOTH AEROBIC AND ANAEROBIC BOTTLES CRITICAL RESULT CALLED TO, READ BACK BY AND VERIFIED WITH: L. Huntley Dec.D. 15:30 02/19/17 ([email or web address])    Culture METHICILLIN RESISTANT STAPHYLOCOCCUS AUREUS (A)  Final   Report Status 02/21/2017 FINAL  Final   Organism ID, Bacteria METHICILLIN RESISTANT STAPHYLOCOCCUS AUREUS  Final      Susceptibility   Methicillin resistant staphylococcus aureus - MIC*    CIPROFLOXACIN >=8 RESISTANT Resistant     ERYTHROMYCIN >=8 RESISTANT Resistant     GENTAMICIN <=0.5 SENSITIVE Sensitive     OXACILLIN >=4 RESISTANT Resistant     TETRACYCLINE <=1 SENSITIVE Sensitive     VANCOMYCIN 1 SENSITIVE Sensitive     TRIMETH/SULFA <=10 SENSITIVE Sensitive     CLINDAMYCIN <=0.25 SENSITIVE Sensitive     RIFAMPIN <=0.5 SENSITIVE Sensitive  Inducible Clindamycin NEGATIVE Sensitive     * METHICILLIN RESISTANT  STAPHYLOCOCCUS AUREUS  MRSA PCR Screening     Status: None   Collection Time: 02/18/17 11:22 PM  Result Value Ref Range Status   MRSA by PCR NEGATIVE NEGATIVE Final    Comment:        The GeneXpert MRSA Assay (FDA approved for NASAL specimens only), is one component of a comprehensive MRSA colonization surveillance program. It is not intended to diagnose MRSA infection nor to guide or monitor treatment for MRSA infections.   MRSA PCR Screening     Status: Abnormal   Collection Time: 02/18/17 11:22 PM  Result Value Ref Range Status   MRSA by PCR POSITIVE (A) NEGATIVE Final    Comment:        The GeneXpert MRSA Assay (FDA approved for NASAL specimens only), is one component of a comprehensive MRSA colonization surveillance program. It is not intended to diagnose MRSA infection nor to guide or monitor treatment for MRSA infections. RESULT CALLED TO, READ BACK BY AND VERIFIED WITH: DULLA,R RN 2353 02/19/17 MITCHELL,L   Culture, blood (routine x 2)     Status: None   Collection Time: 02/20/17  5:20 AM  Result Value Ref Range Status   Specimen Description BLOOD LEFT ANTECUBITAL  Final   Special Requests   Final    BOTTLES DRAWN AEROBIC AND ANAEROBIC Blood Culture adequate volume   Culture NO GROWTH 5 DAYS  Final   Report Status 02/25/2017 FINAL  Final  Culture, blood (routine x 2)     Status: None   Collection Time: 02/20/17  5:21 AM  Result Value Ref Range Status   Specimen Description BLOOD LEFT HAND  Final   Special Requests   Final    BOTTLES DRAWN AEROBIC ONLY Blood Culture adequate volume   Culture NO GROWTH 5 DAYS  Final   Report Status 02/25/2017 FINAL  Final      Radiology Studies: No results found.   Scheduled Meds: . aspirin EC  325 mg Oral Daily  . atorvastatin  40 mg Oral q1800  . carvedilol  12.5 mg Oral 2 times per day on Sun Tue Thu Sat  . cinacalcet  30 mg Oral Q breakfast  . clopidogrel  75 mg Oral Daily  . darbepoetin (ARANESP) injection -  DIALYSIS  60 mcg Intravenous Q Fri-HD  . doxercalciferol  1.5 mcg Intravenous Q M,W,F-HD  . feeding supplement (NEPRO CARB STEADY)  237 mL Oral Q1500  . feeding supplement (PRO-STAT SUGAR FREE 64)  30 mL Oral Daily  . finasteride  5 mg Oral Daily  . heparin  5,000 Units Subcutaneous Q8H  . insulin aspart  0-5 Units Subcutaneous QHS  . insulin aspart  0-9 Units Subcutaneous TID WC  . insulin glargine  15 Units Subcutaneous QHS  . multivitamin  1 tablet Oral Q M,W,F-2000  . pantoprazole  40 mg Oral Daily  . polyethylene glycol  34 g Oral BID  . senna  2 tablet Oral QHS  . sevelamer carbonate  2,400 mg Oral TID WC  . sodium chloride flush  3 mL Intravenous Q12H  . triamcinolone 0.1 % cream : eucerin   Topical TID   Continuous Infusions: . sodium chloride    . sodium chloride Stopped (02/23/17 2300)  . vancomycin 1,000 mg (02/26/17 1531)     LOS: 9 days    Time spent: 30 min    Janece Canterbury, MD [facility] Pager [phone]  If  7PM-7AM, please contact night-coverage [email or web address] Password [facility] 02/27/2017, 2:36 PM

## 2017-02-28 ENCOUNTER — Inpatient Hospital Stay (HOSPITAL_COMMUNITY): Payer: Medicare Other | Admitting: Anesthesiology

## 2017-02-28 ENCOUNTER — Encounter (HOSPITAL_COMMUNITY)
Admission: EM | Disposition: A | Discharge status: Skilled Nursing Facility | Payer: Self-pay | Source: Home / Self Care | Attending: Internal Medicine

## 2017-02-28 DIAGNOSIS — T814XXA Infection following a procedure, initial encounter: Secondary | ICD-10-CM

## 2017-02-28 DIAGNOSIS — T8743 Infection of amputation stump, right lower extremity: Secondary | ICD-10-CM

## 2017-02-28 HISTORY — PX: AMPUTATION: SHX166

## 2017-02-28 LAB — CBC
HEMATOCRIT: 26.1 % — AB (ref 39.0–52.0)
Hemoglobin: 7.8 g/dL — ABNORMAL LOW (ref 13.0–17.0)
MCH: 24.8 pg — ABNORMAL LOW (ref 26.0–34.0)
MCHC: 29.9 g/dL — ABNORMAL LOW (ref 30.0–36.0)
MCV: 83.1 fL (ref 78.0–100.0)
Platelets: 510 10*3/uL — ABNORMAL HIGH (ref 150–400)
RBC: 3.14 MIL/uL — AB (ref 4.22–5.81)
RDW: 17.7 % — AB (ref 11.5–15.5)
WBC: 17.8 10*3/uL — AB (ref 4.0–10.5)

## 2017-02-28 LAB — RENAL FUNCTION PANEL
ALBUMIN: 2 g/dL — AB (ref 3.5–5.0)
ANION GAP: 12 (ref 5–15)
BUN: 39 mg/dL — AB (ref 6–20)
CHLORIDE: 94 mmol/L — AB (ref 101–111)
CO2: 25 mmol/L (ref 22–32)
Calcium: 8.1 mg/dL — ABNORMAL LOW (ref 8.9–10.3)
Creatinine, Ser: 7.37 mg/dL — ABNORMAL HIGH (ref 0.61–1.24)
GFR, EST AFRICAN AMERICAN: 8 mL/min — AB (ref >60)
GFR, EST NON AFRICAN AMERICAN: 7 mL/min — AB (ref >60)
Glucose, Bld: 156 mg/dL — ABNORMAL HIGH (ref 65–99)
PHOSPHORUS: 3.6 mg/dL (ref 2.5–4.6)
POTASSIUM: 4.5 mmol/L (ref 3.5–5.1)
Sodium: 131 mmol/L — ABNORMAL LOW (ref 135–145)

## 2017-02-28 LAB — GLUCOSE, CAPILLARY
GLUCOSE-CAPILLARY: 131 mg/dL — AB (ref 65–99)
Glucose-Capillary: 141 mg/dL — ABNORMAL HIGH (ref 65–99)
Glucose-Capillary: 164 mg/dL — ABNORMAL HIGH (ref 65–99)
Glucose-Capillary: 113 mg/dL — ABNORMAL HIGH (ref 65–99)

## 2017-02-28 LAB — POCT I-STAT 4, (NA,K, GLUC, HGB,HCT)
Glucose, Bld: 178 mg/dL — ABNORMAL HIGH (ref 65–99)
HCT: 32 % — ABNORMAL LOW (ref 39.0–52.0)
HEMOGLOBIN: 10.9 g/dL — AB (ref 13.0–17.0)
POTASSIUM: 4.9 mmol/L (ref 3.5–5.1)
SODIUM: 134 mmol/L — AB (ref 135–145)

## 2017-02-28 SURGERY — AMPUTATION BELOW KNEE
Anesthesia: General | Site: Leg Lower | Laterality: Right

## 2017-02-28 MED ORDER — POTASSIUM CHLORIDE CRYS ER 20 MEQ PO TBCR: 20.0000 meq | EXTENDED_RELEASE_TABLET | Freq: Every day | ORAL | Status: DC | PRN

## 2017-02-28 MED ORDER — LIDOCAINE HCL (PF) 1 % IJ SOLN: 5.0000 mL | INTRAMUSCULAR | Status: DC | PRN

## 2017-02-28 MED ORDER — HYDROMORPHONE HCL 1 MG/ML IJ SOLN
INTRAMUSCULAR | Status: AC
  Administered 2017-02-28: 0.5 mg via INTRAVENOUS
  Filled 2017-02-28: qty 1

## 2017-02-28 MED ORDER — ALBUMIN HUMAN 5 % IV SOLN
INTRAVENOUS | Status: DC | PRN
  Administered 2017-02-28 (×2): via INTRAVENOUS

## 2017-02-28 MED ORDER — SUCCINYLCHOLINE CHLORIDE 200 MG/10ML IV SOSY
PREFILLED_SYRINGE | INTRAVENOUS | Status: AC
  Filled 2017-02-28: qty 10

## 2017-02-28 MED ORDER — DOCUSATE SODIUM 100 MG PO CAPS: 100.0000 mg | ORAL_CAPSULE | Freq: Every day | ORAL | Status: DC

## 2017-02-28 MED ORDER — PHENYLEPHRINE 40 MCG/ML (10ML) SYRINGE FOR IV PUSH (FOR BLOOD PRESSURE SUPPORT)
PREFILLED_SYRINGE | INTRAVENOUS | Status: AC
  Filled 2017-02-28: qty 10

## 2017-02-28 MED ORDER — OXYCODONE-ACETAMINOPHEN 5-325 MG PO TABS
1.0000 | ORAL_TABLET | ORAL | Status: DC | PRN
  Administered 2017-02-28 – 2017-03-03 (×12): 2 via ORAL
  Filled 2017-02-28 (×11): qty 2

## 2017-02-28 MED ORDER — PENTAFLUOROPROP-TETRAFLUOROETH EX AERO: 1.0000 "application " | INHALATION_SPRAY | CUTANEOUS | Status: DC | PRN

## 2017-02-28 MED ORDER — MORPHINE SULFATE (PF) 2 MG/ML IV SOLN
2.0000 mg | INTRAVENOUS | Status: DC | PRN
  Administered 2017-02-28: 2 mg via INTRAVENOUS
  Filled 2017-02-28 (×2): qty 1

## 2017-02-28 MED ORDER — MORPHINE SULFATE (PF) 2 MG/ML IV SOLN
2.0000 mg | Freq: Once | INTRAVENOUS | Status: AC
  Administered 2017-02-28: 2 mg via INTRAVENOUS

## 2017-02-28 MED ORDER — PROPOFOL 10 MG/ML IV BOLUS
INTRAVENOUS | Status: DC | PRN
  Administered 2017-02-28: 100 mg via INTRAVENOUS

## 2017-02-28 MED ORDER — SODIUM CHLORIDE 0.9 % IV SOLN: 100.0000 mL | INTRAVENOUS | Status: DC | PRN

## 2017-02-28 MED ORDER — METOPROLOL TARTRATE 5 MG/5ML IV SOLN
2.0000 mg | INTRAVENOUS | Status: DC | PRN
Start: 2017-02-28 — End: 2017-03-03

## 2017-02-28 MED ORDER — EPHEDRINE 5 MG/ML INJ
INTRAVENOUS | Status: AC
  Filled 2017-02-28: qty 10

## 2017-02-28 MED ORDER — HEPARIN SODIUM (PORCINE) 5000 UNIT/ML IJ SOLN
5000.0000 [IU] | Freq: Three times a day (TID) | INTRAMUSCULAR | Status: DC
  Administered 2017-03-01 – 2017-03-03 (×7): 5000 [IU] via SUBCUTANEOUS
  Filled 2017-02-28 (×7): qty 1

## 2017-02-28 MED ORDER — PROPOFOL 10 MG/ML IV BOLUS
INTRAVENOUS | Status: AC
  Filled 2017-02-28: qty 20

## 2017-02-28 MED ORDER — VANCOMYCIN HCL 1000 MG IV SOLR
INTRAVENOUS | Status: DC | PRN
  Administered 2017-02-28: 1000 mg via INTRAVENOUS

## 2017-02-28 MED ORDER — MIDAZOLAM HCL 2 MG/2ML IJ SOLN
INTRAMUSCULAR | Status: AC
  Filled 2017-02-28: qty 2

## 2017-02-28 MED ORDER — FENTANYL CITRATE (PF) 100 MCG/2ML IJ SOLN
INTRAMUSCULAR | Status: DC | PRN
  Administered 2017-02-28 (×3): 50 ug via INTRAVENOUS

## 2017-02-28 MED ORDER — LIDOCAINE 2% (20 MG/ML) 5 ML SYRINGE
INTRAMUSCULAR | Status: AC
  Filled 2017-02-28: qty 5

## 2017-02-28 MED ORDER — LIDOCAINE HCL (CARDIAC) 20 MG/ML IV SOLN
INTRAVENOUS | Status: DC | PRN
  Administered 2017-02-28: 80 mg via INTRAVENOUS

## 2017-02-28 MED ORDER — HYDROMORPHONE HCL 1 MG/ML IJ SOLN
0.2500 mg | INTRAMUSCULAR | Status: DC | PRN
  Administered 2017-02-28 (×2): 0.5 mg via INTRAVENOUS

## 2017-02-28 MED ORDER — ONDANSETRON HCL 4 MG/2ML IJ SOLN
INTRAMUSCULAR | Status: DC | PRN
  Administered 2017-02-28: 4 mg via INTRAVENOUS

## 2017-02-28 MED ORDER — ONDANSETRON HCL 4 MG/2ML IJ SOLN
INTRAMUSCULAR | Status: AC
  Filled 2017-02-28: qty 2

## 2017-02-28 MED ORDER — LIDOCAINE-PRILOCAINE 2.5-2.5 % EX CREA
1.0000 "application " | TOPICAL_CREAM | CUTANEOUS | Status: DC | PRN
  Filled 2017-02-28: qty 5

## 2017-02-28 MED ORDER — VANCOMYCIN HCL IN DEXTROSE 1-5 GM/200ML-% IV SOLN
INTRAVENOUS | Status: AC
  Filled 2017-02-28: qty 200

## 2017-02-28 MED ORDER — ALTEPLASE 2 MG IJ SOLR: 2.0000 mg | Freq: Once | INTRAMUSCULAR | Status: DC | PRN

## 2017-02-28 MED ORDER — 0.9 % SODIUM CHLORIDE (POUR BTL) OPTIME
TOPICAL | Status: DC | PRN
  Administered 2017-02-28: 1000 mL

## 2017-02-28 MED ORDER — EPHEDRINE SULFATE 50 MG/ML IJ SOLN
INTRAMUSCULAR | Status: DC | PRN
  Administered 2017-02-28 (×4): 10 mg via INTRAVENOUS

## 2017-02-28 MED ORDER — HYDROMORPHONE HCL 1 MG/ML IJ SOLN
1.0000 mg | INTRAMUSCULAR | Status: DC | PRN
  Administered 2017-02-28 – 2017-03-02 (×8): 1 mg via INTRAVENOUS
  Filled 2017-02-28 (×7): qty 1

## 2017-02-28 MED ORDER — DEXTROSE 5 % IV SOLN
INTRAVENOUS | Status: DC | PRN
  Administered 2017-02-28: 40 ug/min via INTRAVENOUS

## 2017-02-28 MED ORDER — FENTANYL CITRATE (PF) 250 MCG/5ML IJ SOLN
INTRAMUSCULAR | Status: AC
  Filled 2017-02-28: qty 5

## 2017-02-28 MED ORDER — MAGNESIUM SULFATE 2 GM/50ML IV SOLN: 2.0000 g | Freq: Every day | INTRAVENOUS | Status: DC | PRN

## 2017-02-28 MED ORDER — ROCURONIUM BROMIDE 100 MG/10ML IV SOLN
INTRAVENOUS | Status: DC | PRN
  Administered 2017-02-28: 100 mg via INTRAVENOUS

## 2017-02-28 MED ORDER — HEPARIN SODIUM (PORCINE) 1000 UNIT/ML DIALYSIS: 1000.0000 [IU] | INTRAMUSCULAR | Status: DC | PRN

## 2017-02-28 SURGICAL SUPPLY — 49 items
BANDAGE ACE 4X5 VEL STRL LF (GAUZE/BANDAGES/DRESSINGS) ×3 IMPLANT
BANDAGE ACE 6X5 VEL STRL LF (GAUZE/BANDAGES/DRESSINGS) ×2 IMPLANT
BANDAGE ESMARK 6X9 LF (GAUZE/BANDAGES/DRESSINGS) IMPLANT
BLADE SAW GIGLI 510 (BLADE) ×2 IMPLANT
BLADE SAW GIGLI 510MM (BLADE) ×1
BNDG CMPR 9X6 STRL LF SNTH (GAUZE/BANDAGES/DRESSINGS)
BNDG ESMARK 6X9 LF (GAUZE/BANDAGES/DRESSINGS)
BNDG GAUZE ELAST 4 BULKY (GAUZE/BANDAGES/DRESSINGS) ×2 IMPLANT
CANISTER SUCT 3000ML PPV (MISCELLANEOUS) ×3 IMPLANT
CLIP LIGATING EXTRA MED SLVR (CLIP) ×3 IMPLANT
CLIP LIGATING EXTRA SM BLUE (MISCELLANEOUS) ×3 IMPLANT
COVER SURGICAL LIGHT HANDLE (MISCELLANEOUS) ×4 IMPLANT
CUFF TOURNIQUET SINGLE 34IN LL (TOURNIQUET CUFF) IMPLANT
CUFF TOURNIQUET SINGLE 44IN (TOURNIQUET CUFF) IMPLANT
DRAIN SNY 10X20 3/4 PERF (WOUND CARE) IMPLANT
DRAPE HALF SHEET 40X57 (DRAPES) ×5 IMPLANT
DRAPE ORTHO SPLIT 77X108 STRL (DRAPES) ×6
DRAPE SURG ORHT 6 SPLT 77X108 (DRAPES) ×2 IMPLANT
ELECT REM PT RETURN 9FT ADLT (ELECTROSURGICAL) ×3
ELECTRODE REM PT RTRN 9FT ADLT (ELECTROSURGICAL) ×1 IMPLANT
EVACUATOR SILICONE 100CC (DRAIN) IMPLANT
GAUZE SPONGE 4X4 12PLY STRL (GAUZE/BANDAGES/DRESSINGS) ×3 IMPLANT
GAUZE SPONGE 4X4 12PLY STRL LF (GAUZE/BANDAGES/DRESSINGS) ×2 IMPLANT
GAUZE XEROFORM 5X9 LF (GAUZE/BANDAGES/DRESSINGS) ×3 IMPLANT
GLOVE BIOGEL PI IND STRL 6.5 (GLOVE) IMPLANT
GLOVE BIOGEL PI IND STRL 7.5 (GLOVE) IMPLANT
GLOVE BIOGEL PI INDICATOR 6.5 (GLOVE) ×4
GLOVE BIOGEL PI INDICATOR 7.5 (GLOVE) ×2
GLOVE SS BIOGEL STRL SZ 7.5 (GLOVE) ×1 IMPLANT
GLOVE SUPERSENSE BIOGEL SZ 7.5 (GLOVE) ×2
GLOVE SURG SS PI 7.5 STRL IVOR (GLOVE) ×2 IMPLANT
GOWN STRL REUS W/ TWL LRG LVL3 (GOWN DISPOSABLE) ×3 IMPLANT
GOWN STRL REUS W/TWL LRG LVL3 (GOWN DISPOSABLE) ×9
KIT BASIN OR (CUSTOM PROCEDURE TRAY) ×3 IMPLANT
KIT ROOM TURNOVER OR (KITS) ×3 IMPLANT
NS IRRIG 1000ML POUR BTL (IV SOLUTION) ×3 IMPLANT
PACK GENERAL/GYN (CUSTOM PROCEDURE TRAY) ×3 IMPLANT
PAD ARMBOARD 7.5X6 YLW CONV (MISCELLANEOUS) ×6 IMPLANT
PADDING CAST COTTON 6X4 STRL (CAST SUPPLIES) IMPLANT
SPONGE LAP 18X18 X RAY DECT (DISPOSABLE) ×2 IMPLANT
STAPLER VISISTAT 35W (STAPLE) ×3 IMPLANT
STOCKINETTE IMPERVIOUS LG (DRAPES) ×3 IMPLANT
SUT ETHILON 3 0 PS 1 (SUTURE) IMPLANT
SUT VIC AB 0 CT1 18XCR BRD 8 (SUTURE) ×2 IMPLANT
SUT VIC AB 0 CT1 8-18 (SUTURE) ×6
SUT VICRYL AB 2 0 TIES (SUTURE) ×3 IMPLANT
TOWEL GREEN STERILE (TOWEL DISPOSABLE) ×3 IMPLANT
UNDERPAD 30X30 (UNDERPADS AND DIAPERS) ×3 IMPLANT
WATER STERILE IRR 1000ML POUR (IV SOLUTION) ×3 IMPLANT

## 2017-02-28 NOTE — H&P (View-Only) (Signed)
Subjective  -   Resting comfortably   Physical Exam:  Right leg dressing intact.  The right leg is warm to the ankle up. Nonlabored breathing       Assessment/Plan:    I discussed with the patient that he does not have any further options for revascularization.  He is transmetatarsal amputation is not healing and he needs to have this converted to a below-knee amputation.  The patient states that he has been through enough and does not wish to have any other procedures done.  He just wants to go home.  He is scheduled for palliative care meeting later today.  At this time I feel the patient is well informed and understands the risks of this.  I am canceling his operation for tomorrow with plans for palliative care.  Annamarie Major 02/25/2017 10:06 AM --  Vitals:   02/25/17 0429 02/25/17 0800  BP: (!) 132/56 126/88  Pulse: 72 72  Resp: (!) 21 12  Temp: 98.7 F (37.1 C) 97.6 F (36.4 C)    Intake/Output Summary (Last 24 hours) at 02/25/17 1006 Last data filed at 02/24/17 1706  Gross per 24 hour  Intake              240 ml  Output             3000 ml  Net            -2760 ml     Laboratory CBC    Component Value Date/Time   WBC 17.9 (H) 02/24/2017 0449   HGB 8.8 (L) 02/24/2017 0449   HCT 28.5 (L) 02/24/2017 0449   PLT 535 (H) 02/24/2017 0449    BMET    Component Value Date/Time   NA 128 (L) 02/24/2017 0002   K 3.9 02/24/2017 0002   CL 92 (L) 02/24/2017 0002   CO2 25 02/24/2017 0002   GLUCOSE 149 (H) 02/24/2017 0002   BUN 38 (H) 02/24/2017 0002   CREATININE 7.54 (H) 02/24/2017 0002   CALCIUM 8.1 (L) 02/24/2017 0002   GFRNONAA 7 (L) 02/24/2017 0002   GFRAA 8 (L) 02/24/2017 0002    COAG Lab Results  Component Value Date   INR 1.30 12/30/2016   No results found for: PTT  Antibiotics Anti-infectives    Start     Dose/Rate Route Frequency Ordered Stop   02/22/17 0928  vancomycin (VANCOCIN) 1-5 GM/200ML-% IVPB    Comments:  [name]   :  cabinet override      02/22/17 0928 02/22/17 1119   02/19/17 1200  vancomycin (VANCOCIN) IVPB 1000 mg/200 mL premix     1,000 mg 200 mL/hr over 60 Minutes Intravenous Every M-W-F (Hemodialysis) 02/18/17 2109     02/18/17 2200  cefTRIAXone (ROCEPHIN) 2 g in dextrose 5 % 50 mL IVPB  Status:  Discontinued     2 g 100 mL/hr over 30 Minutes Intravenous Every 24 hours 02/18/17 2158 02/19/17 1854   02/18/17 2200  metroNIDAZOLE (FLAGYL) IVPB 500 mg  Status:  Discontinued     500 mg 100 mL/hr over 60 Minutes Intravenous Every 8 hours 02/18/17 2158 02/19/17 1854   02/18/17 2115  clindamycin (CLEOCIN) IVPB 600 mg     600 mg 100 mL/hr over 30 Minutes Intravenous  Once 02/18/17 2101 02/18/17 2228   02/18/17 2115  ciprofloxacin (CIPRO) IVPB 400 mg     400 mg 200 mL/hr over 60 Minutes Intravenous  Once 02/18/17 2101 02/18/17 2327   02/18/17  2115  vancomycin (VANCOCIN) 2,000 mg in sodium chloride 0.9 % 500 mL IVPB     2,000 mg 250 mL/hr over 120 Minutes Intravenous  Once 02/18/17 2104 02/19/17 0149       V. Leia Alf, M.D. [facility] Office: [phone] Pager:  [phone]

## 2017-02-28 NOTE — Progress Notes (Signed)
Pharmacy Antibiotic Note  Jacob [name] is a 66 y.o. male admitted on 02/18/2017 with MRSA bacteremia and osteomyelitis of the right foot.  S/p right BKA on 7/29. Pharmacy has been consulted for vancomyin dosing.  ESRD patient on HD (MWF), has tolerated 4 full 4-hr HD sessions since starting vancomycin 7/19. Patient appropriately received 2 gram load and 1 gram after each HD. A random vancomycin level was obtained on Thurs 7/26 after the 7/25 HD session, result was 22 which is above goal of 5-15 for post-HD level. On schedule for further HD sessions and doses.     Plan: Vancomycin 1 gram IV qHD-MWF - end date 8/31 per ID Vancomycin random level pre-HD Monday 7/30 (goal 15-25) Follow HD schedule/tolerance - history of refusing HD Monitor electrolytes, clinical progress  Height: 6\' 2"  (188 cm) Weight: 269 lb 10 oz (122.3 kg) IBW/kg (Calculated) : 82.2  Temp (24hrs), Avg:97.9 F (36.6 C), Min:97.7 F (36.5 C), Max:98 F (36.7 C)   Recent Labs Lab 02/22/17 0310 02/23/17 0346 02/24/17 0002 02/24/17 0449 02/25/17 0340 02/26/17 1258  WBC 17.1* 19.8* 16.5* 17.9*  --  16.5*  CREATININE 8.57*  --  7.54*  --   --  7.98*  VANCORANDOM  --   --   --   --  22  --     Estimated Creatinine Clearance: 12.5 mL/min (A) (by C-G formula based on SCr of 7.98 mg/dL (H)).    Allergies  Allergen Reactions  . Bee Venom Anaphylaxis  . Iodine Swelling  . Penicillins Swelling and Other (See Comments)    SWELLING REACTION UNSPECIFIED   Has patient had a PCN reaction causing immediate rash, facial/tongue/throat swelling, SOB or lightheadedness with hypotension: No Has patient had a PCN reaction causing severe rash involving mucus membranes or skin necrosis: No Has patient had a PCN reaction that required hospitalization No Has patient had a PCN reaction occurring within the last 10 years: No If all of the above answers are "NO", then may proceed with Cephalosporin use.  . Sulfadiazine Other (See  Comments)    1% Silver Sulfadiazine cream causes burning over a large area of skin.    Antimicrobials this admission: Vanc 7/19 >> *loaded with 2g and given appropriate dose after HD sessions  Ceftriaxone 7/19 >>7/20 Flagyl 7/19 >>7/20 Cipro x 1 7/19 Clindamycin x 1 7/19   Microbiology results: 7/19 BCx: 2/2 MRSA- R to cipro 7/19 BCID MRSA 7/21 BCx: negative x 2 (1st negative culture)  Thank you for allowing pharmacy to be a part of this patient's care.   Charlene Brooke, PharmD PGY1 [place] Resident Pager: [phone] After 5:30PM please call [place] [phone] 02/28/2017 12:06 PM

## 2017-02-28 NOTE — Anesthesia Preprocedure Evaluation (Addendum)
Anesthesia Evaluation  Patient identified by MRN, date of birth, ID band Patient awake    Reviewed: Allergy & Precautions, H&P , NPO status , Patient's Chart, lab work & pertinent test results  Airway Mallampati: II  TM Distance: >3 FB Neck ROM: Full    Dental no notable dental hx. (+) Edentulous Upper, Dental Advisory Given   Pulmonary neg pulmonary ROS,    Pulmonary exam normal breath sounds clear to auscultation       Cardiovascular hypertension, Pt. on medications + Peripheral Vascular Disease   Rhythm:Regular Rate:Normal     Neuro/Psych Anxiety Depression CVA    GI/Hepatic Neg liver ROS, GERD  Medicated and Controlled,  Endo/Other  diabetes, Insulin Dependent  Renal/GU ESRF and DialysisRenal disease  negative genitourinary   Musculoskeletal  (+) Arthritis , Osteoarthritis,    Abdominal   Peds  Hematology negative hematology ROS (+) anemia ,   Anesthesia Other Findings   Reproductive/Obstetrics negative OB ROS                           Anesthesia Physical Anesthesia Plan  ASA: III  Anesthesia Plan: General   Post-op Pain Management:    Induction: Intravenous  PONV Risk Score and Plan: 3 and Ondansetron, Dexamethasone and Midazolam  Airway Management Planned: Oral ETT and LMA  Additional Equipment:   Intra-op Plan:   Post-operative Plan: Extubation in OR  Informed Consent: I have reviewed the patients History and Physical, chart, labs and discussed the procedure including the risks, benefits and alternatives for the proposed anesthesia with the patient or authorized representative who has indicated his/her understanding and acceptance.   Dental advisory given  Plan Discussed with: CRNA  Anesthesia Plan Comments:        Anesthesia Quick Evaluation

## 2017-02-28 NOTE — Op Note (Signed)
    OPERATIVE REPORT  DATE OF SURGERY: 02/28/2017  PATIENT: Jacob [name], 66 y.o. male MRN: [id number]  DOB: 13-Mar-1951  PRE-OPERATIVE DIAGNOSIS: Nonhealing right transmetatarsal amputation with MRSA sepsis  POST-OPERATIVE DIAGNOSIS:  Same  PROCEDURE: Right below-knee amputation  SURGEON:  Curt Jews, M.D.  PHYSICIAN ASSISTANT: Nurse  ANESTHESIA:  Gen.  EBL: 400 ml  Total I/O In: 1250 [I.V.:500; IV Piggyback:750] Out: 400 [Blood:400]  BLOOD ADMINISTERED: None  DRAINS: None  SPECIMEN: Right below-knee amputation specimen  COUNTS CORRECT:  YES  PLAN OF CARE: PACU   PATIENT DISPOSITION:  PACU - hemodynamically stable  PROCEDURE DETAILS: Patient was taken to the operative placed supine position where the area of the right leg was prepped and draped in usual sterile fashion. Incision was made several fingerbreadths below the tibial prominence and a posterior-based gastrocnemius flap was left. Incision was carried down through the subcutaneous tissue in line with the skin incision. There was excellent bleeding at the level of the skin incision. The anterior muscle compartment was divided with electrocautery and anterior tibial artery and vein were ligated and divided. The arteries were extremely calcified but were patent. The soleus muscle was divided in line with the anterior incision and the popliteal artery and vein were ligated and divided. Again there was extensive calcification of the vessel. Periosteum was elevated off the tibia and the fibula. The fibula was divided proximal to the tibial resection. The fibula was divided with bone shears. The tibia was divided with a Gigli saw. The edges of the tibia were smoothed with a bone rasp. Specimen was passed off the field. The wound was irrigated with saline and hemostasis tablet cautery. The posterior fascia was closed to the anterior fascia with interrupted 0 Vicryl figure-of-eight sutures. Skin was closed with skin staples. A  iodoform gauze and a sterile dressing and Ace wrap were applied and the patient was transferred to the recovery room in stable condition   [name], M.D., [facility] 02/28/2017 9:49 AM

## 2017-02-28 NOTE — Anesthesia Postprocedure Evaluation (Signed)
Anesthesia Post Note  Patient: Jacob [name]  Procedure(s) Performed: Procedure(s) (LRB): RIGHT BELOW KNEE AMPUTATION (Right)     Patient location during evaluation: PACU Anesthesia Type: General Level of consciousness: awake and alert Pain management: pain level controlled Vital Signs Assessment: post-procedure vital signs reviewed and stable Respiratory status: spontaneous breathing, nonlabored ventilation and respiratory function stable Cardiovascular status: blood pressure returned to baseline and stable Postop Assessment: no signs of nausea or vomiting Anesthetic complications: no    Last Vitals:  Vitals:   02/28/17 1054 02/28/17 1130  BP: (!) 120/51 (!) 146/54  Pulse: 65 66  Resp: (!) 21 16  Temp: 36.5 C 36.7 C    Last Pain:  Vitals:   02/28/17 1130  TempSrc: Oral  PainSc:                  Breda Bond,W. EDMOND

## 2017-02-28 NOTE — Progress Notes (Signed)
PROGRESS NOTE  Jacob [name]  VZD:[id number] DOB: 1950/12/13 DOA: 02/18/2017 PCP: Jacob Sites, MD  Brief Narrative:   The patient is a 66 year old male resident of the [facility] with diabetes mellitus, ESRD, hypertension, previous CVA, peripheral arterial disease who presented with a wound infection.  He recently underwent revascularization of the right lower extremity and had a transmetatarsal amputation performed on 01/01/2017 of the right foot. He was seen at the vascular office on July 17 and was found to have poor wound healing. He came to the emergency department on 7/19 with wound dehiscence and a marked leukocytosis. Vascular surgery was consulted.  The patient underwent angiography on 7/24. Unfortunately, the vascular surgeon was unable to perform angioplasty of the right anterior tibial artery and he had no surgical targets.  Dr. Trula Slade recommended a right BKA for source control as the patient has been found to have MRSA bacteremia.  The patient repeated declined amputation for three days, but changed his mind the evening of 7/27 and demanded immediate surgery.  Only a few hours prior, the medical staff had to convince him to take dialysis.  Psychiatry met with the patient and determined that he does have capacity for medical decision-making.  The surgeons have kindly agreed to post the patient for surgery on 7/29.  If he changes his mind, social work is standing by to discharge the patient to SNF and he can follow up with vascular surgery as an outpatient.    Assessment & Plan:   Principal Problem:   Wound infection Active Problems:   End-stage renal disease on hemodialysis ([place])   Essential hypertension, benign   Insulin-requiring or dependent type II diabetes mellitus ([place])   Normocytic anemia   Hyponatremia   Right foot infection   DNR (do not resuscitate)   Palliative care by specialist  Infected right nonhealing transmetatarsal amputation wound with dehiscence, likely  the source of his MRSA bacteremia.  He was seen by vascular surgery and by palliative care.   -  underwent right BKA by Dr. Donnetta Hutching on 7/29 -  Continue vancomycin -  Palliative care to assist with pain control  MRSA bacteremia, likely secondary to his forefoot infection - MRI in 7/20 suggested possible discitis, however his back pain has improved and he will already be receiving a long course of vancomycin - Continue vancomycin for 4 weeks post amputation now that patient is undergoing source control.  - ECHO: No evidence of endocarditis - ID consult appreciated  Toxic metabolic encephalopathy secondary to sepsis and sedating medications, resolved  End-stage renal disease, normally a Monday Wednesday Friday schedule. Hyponatremia and secondary hyperparathyroidism related. - Appreciate nephrology assistance  Acute blood loss anemia superimposed on anemia of chronic disease - Transfused 2 units of pack red blood cells on 7/23 - Receiving iron and EPO per nephrology  History of CVA, stable, continue Plavix and aspirin  Nonischemic cardiomyopathy, EF was 55-60 percent on 03/07/2015, volume control at dialysis  Hypertension with intermittent hypotension - Continue Coreg - Consider resuming lisinopril if his blood pressure remained stable during hemodialysis  Possible seizure disorder, bipolar disorder. Patient was previously on Depakote but had not taking the medication in 2 months. Currently being held.  Diabetes mellitus type 2 with peripheral vascular palpitations, CBG well controlled.  A1c 7.7 in May 2018 - Continue Lantus 15units daily at bedtime - Continue sliding scale insulin  Persistent leukocytosis secondary to MRSA bacteremia and osteomyelitis of his foot  DVT prophylaxis:  lovenox Code Status:  DNR Family Communication:  Patient and his sister who was at bedside Disposition Plan:  For surgery tomorrow.  SNF in 2-3 days  Consultants:    Nephrology  Dr. Trula Slade, VVS  Infectious disease, Dr. Novella Olive  Procedures:   7/24:  Aortogram with RLE run-off and failed attempt at angioplasty of the RLE on 7/24 Aortogram: No significant renal artery stenosis. No infrarenal aortic stenosis. Bilateral iliac arteries are widely patent Right Lower Extremity: The right common femoral profunda femoral and superficial femoral artery are patent without stenosis. The popliteal artery is patent throughout it's course. There is mild luminal irregularity at the curve of the anterior tibial artery. Anterior tibial is patent down to just above the ankle where it occludes. The posterior tibial and peroneal arteries are occluded Left Lower Extremity:Not evaluated  Antimicrobials:  Anti-infectives    Start     Dose/Rate Route Frequency Ordered Stop   02/26/17 1449  vancomycin (VANCOCIN) 1-5 GM/200ML-% IVPB    Comments:  [name]   : cabinet override      02/26/17 1449 02/26/17 1532   02/26/17 0000  vancomycin (VANCOCIN) 1-5 GM/200ML-% SOLN     1,000 mg 200 mL/hr over 60 Minutes Intravenous Every M-W-F (Hemodialysis) 02/26/17 1304     02/22/17 0928  vancomycin (VANCOCIN) 1-5 GM/200ML-% IVPB    Comments:  [name]   : cabinet override      02/22/17 0928 02/22/17 1119   02/19/17 1200  vancomycin (VANCOCIN) IVPB 1000 mg/200 mL premix     1,000 mg 200 mL/hr over 60 Minutes Intravenous Every M-W-F (Hemodialysis) 02/18/17 2109     02/18/17 2200  cefTRIAXone (ROCEPHIN) 2 g in dextrose 5 % 50 mL IVPB  Status:  Discontinued     2 g 100 mL/hr over 30 Minutes Intravenous Every 24 hours 02/18/17 2158 02/19/17 1854   02/18/17 2200  metroNIDAZOLE (FLAGYL) IVPB 500 mg  Status:  Discontinued     500 mg 100 mL/hr over 60 Minutes Intravenous Every 8 hours 02/18/17 2158 02/19/17 1854   02/18/17 2115  clindamycin (CLEOCIN) IVPB 600 mg     600 mg 100 mL/hr over 30 Minutes Intravenous  Once 02/18/17 2101 02/18/17 2228    02/18/17 2115  ciprofloxacin (CIPRO) IVPB 400 mg     400 mg 200 mL/hr over 60 Minutes Intravenous  Once 02/18/17 2101 02/18/17 2327   02/18/17 2115  vancomycin (VANCOCIN) 2,000 mg in sodium chloride 0.9 % 500 mL IVPB     2,000 mg 250 mL/hr over 120 Minutes Intravenous  Once 02/18/17 2104 02/19/17 0149       Subjective:  In severe pain post operatively.  RN about to bring pain medications.  Denies chest pains, SOB, nausea, lightheadedness.    Objective: Vitals:   02/28/17 1024 02/28/17 1039 02/28/17 1054 02/28/17 1130  BP: (!) 119/54 (!) 117/50 (!) 120/51 (!) 146/54  Pulse: 65 65 65 66  Resp: (!) 21 19 (!) 21 16  Temp:   97.7 F (36.5 C) 98 F (36.7 C)  TempSrc:    Oral  SpO2: 100% 100% 99% 100%  Weight:      Height:        Intake/Output Summary (Last 24 hours) at 02/28/17 1432 Last data filed at 02/28/17 0914  Gross per 24 hour  Intake             1490 ml  Output              400 ml  Net  1090 ml   Filed Weights   02/26/17 1227 02/26/17 1627 02/28/17 0451  Weight: 124.3 kg (274 lb 0.5 oz) 121.5 kg (267 lb 13.7 oz) 122.3 kg (269 lb 10 oz)    Examination:  General exam:  Adult male, HOH.  No acute distress.  Crying out in pain HEENT:  NCAT, MMM Respiratory system: CTAB Cardiovascular system:  RRR, no mrg, Warm extremities Gastrointestinal system:  Normal active BS, soft, nondistended, nontender MSK:  Right stump wrapped in kerlix.  Bandage not removed for exam today.   Neuro:  Grossly moves all extremities    Data Reviewed: I have personally reviewed following labs and imaging studies  CBC:  Recent Labs Lab 02/23/17 0346 02/24/17 0002 02/24/17 0449 02/26/17 1258 02/28/17 1346  WBC 19.8* 16.5* 17.9* 16.5* 17.8*  NEUTROABS 15.5*  --  15.2*  --   --   HGB 8.7* 8.7* 8.8* 8.4* 7.8*  HCT 27.8* 27.9* 28.5* 27.6* 26.1*  MCV 81.8 82.3 82.1 82.6 83.1  PLT 508* 482* 535* 581* 003*   Basic Metabolic Panel:  Recent Labs Lab 02/22/17 0310  02/24/17 0002 02/26/17 1258  NA 130* 128* 131*  K 3.6 3.9 4.5  CL 92* 92* 94*  CO2 _0 GLUCOSE 71 149* 146*  BUN 47* 38* 38*  CREATININE 8.57* 7.54* 7.98*  CALCIUM 8.0* 8.1* 8.2*  PHOS  --  3.5 3.3   GFR: Estimated Creatinine Clearance: 12.5 mL/min (A) (by C-G formula based on SCr of 7.98 mg/dL (H)). Liver Function Tests:  Recent Labs Lab 02/22/17 0310 02/24/17 0002 02/26/17 1258  AST 26  --   --   ALT 9*  --   --   ALKPHOS 193*  --   --   BILITOT 0.9  --   --   PROT 7.9  --   --   ALBUMIN 1.9* 1.9* 1.8*   No results for input(s): LIPASE, AMYLASE in the last 168 hours. No results for input(s): AMMONIA in the last 168 hours. Coagulation Profile: No results for input(s): INR, PROTIME in the last 168 hours. Cardiac Enzymes: No results for input(s): CKTOTAL, CKMB, CKMBINDEX, TROPONINI in the last 168 hours. BNP (last 3 results) No results for input(s): PROBNP in the last 8760 hours. HbA1C: No results for input(s): HGBA1C in the last 72 hours. CBG:  Recent Labs Lab 02/27/17 1215 02/27/17 1711 02/27/17 2114 02/28/17 1001 02/28/17 1214  GLUCAP 147* 181* 178* 141* 164*   Lipid Profile: No results for input(s): CHOL, HDL, LDLCALC, TRIG, CHOLHDL, LDLDIRECT in the last 72 hours. Thyroid Function Tests: No results for input(s): TSH, T4TOTAL, FREET4, T3FREE, THYROIDAB in the last 72 hours. Anemia Panel: No results for input(s): VITAMINB12, FOLATE, FERRITIN, TIBC, IRON, RETICCTPCT in the last 72 hours. Urine analysis: No results found for: COLORURINE, APPEARANCEUR, LABSPEC, [place], GLUCOSEU, HGBUR, BILIRUBINUR, KETONESUR, PROTEINUR, UROBILINOGEN, NITRITE, LEUKOCYTESUR Sepsis Labs: _1 (procalcitonin:4,lacticidven:4)  ) Recent Results (from the past 240 hour(s))  Culture, blood (routine x 2)     Status: Abnormal   Collection Time: 02/18/17  6:21 PM  Result Value Ref Range Status   Specimen Description BLOOD LEFT HAND  Final   Special Requests IN  PEDIATRIC BOTTLE Blood Culture adequate volume  Final   Culture  Setup Time   Final    GRAM POSITIVE COCCI IN CLUSTERS AEROBIC BOTTLE ONLY CRITICAL RESULT CALLED TO, READ BACK BY AND VERIFIED WITH: L. Huntley Dec.D. 15:30 02/19/17 (wilsonm)    Culture (A)  Final    STAPHYLOCOCCUS  AUREUS SUSCEPTIBILITIES PERFORMED ON PREVIOUS CULTURE WITHIN THE LAST 5 DAYS.    Report Status 02/21/2017 FINAL  Final  Blood Culture ID Panel (Reflexed)     Status: Abnormal   Collection Time: 02/18/17  6:21 PM  Result Value Ref Range Status   Enterococcus species NOT DETECTED NOT DETECTED Final   Listeria monocytogenes NOT DETECTED NOT DETECTED Final   Staphylococcus species DETECTED (A) NOT DETECTED Final    Comment: CRITICAL RESULT CALLED TO, READ BACK BY AND VERIFIED WITH: L. Vincente Poli.D. 15:30 02/19/17 ([email or web address])    Staphylococcus aureus DETECTED (A) NOT DETECTED Final    Comment: Methicillin (oxacillin)-resistant Staphylococcus aureus (MRSA). MRSA is predictably resistant to beta-lactam antibiotics (except ceftaroline). Preferred therapy is vancomycin unless clinically contraindicated. Patient requires contact precautions if  hospitalized. CRITICAL RESULT CALLED TO, READ BACK BY AND VERIFIED WITH: L. Vincente Poli.D. 15:30 02/19/17 (wilsonm)    Methicillin resistance DETECTED (A) NOT DETECTED Final    Comment: CRITICAL RESULT CALLED TO, READ BACK BY AND VERIFIED WITH: L. Vincente Poli.D. 15:30 02/19/17 ([email or web address])    Streptococcus species NOT DETECTED NOT DETECTED Final   Streptococcus agalactiae NOT DETECTED NOT DETECTED Final   Streptococcus pneumoniae NOT DETECTED NOT DETECTED Final   Streptococcus pyogenes NOT DETECTED NOT DETECTED Final   Acinetobacter baumannii NOT DETECTED NOT DETECTED Final   Enterobacteriaceae species NOT DETECTED NOT DETECTED Final   Enterobacter cloacae complex NOT DETECTED NOT DETECTED Final   Escherichia coli NOT DETECTED NOT DETECTED Final   Klebsiella oxytoca NOT  DETECTED NOT DETECTED Final   Klebsiella pneumoniae NOT DETECTED NOT DETECTED Final   Proteus species NOT DETECTED NOT DETECTED Final   Serratia marcescens NOT DETECTED NOT DETECTED Final   Haemophilus influenzae NOT DETECTED NOT DETECTED Final   Neisseria meningitidis NOT DETECTED NOT DETECTED Final   Pseudomonas aeruginosa NOT DETECTED NOT DETECTED Final   Candida albicans NOT DETECTED NOT DETECTED Final   Candida glabrata NOT DETECTED NOT DETECTED Final   Candida krusei NOT DETECTED NOT DETECTED Final   Candida parapsilosis NOT DETECTED NOT DETECTED Final   Candida tropicalis NOT DETECTED NOT DETECTED Final  Culture, blood (routine x 2)     Status: Abnormal   Collection Time: 02/18/17  9:34 PM  Result Value Ref Range Status   Specimen Description BLOOD LEFT ARM  Final   Special Requests   Final    BOTTLES DRAWN AEROBIC AND ANAEROBIC Blood Culture adequate volume   Culture  Setup Time   Final    GRAM POSITIVE COCCI IN BOTH AEROBIC AND ANAEROBIC BOTTLES CRITICAL RESULT CALLED TO, READ BACK BY AND VERIFIED WITH: L. Vincente Poli.D. 15:30 02/19/17 ([email or web address])    Culture METHICILLIN RESISTANT STAPHYLOCOCCUS AUREUS (A)  Final   Report Status 02/21/2017 FINAL  Final   Organism ID, Bacteria METHICILLIN RESISTANT STAPHYLOCOCCUS AUREUS  Final      Susceptibility   Methicillin resistant staphylococcus aureus - MIC*    CIPROFLOXACIN >=8 RESISTANT Resistant     ERYTHROMYCIN >=8 RESISTANT Resistant     GENTAMICIN <=0.5 SENSITIVE Sensitive     OXACILLIN >=4 RESISTANT Resistant     TETRACYCLINE <=1 SENSITIVE Sensitive     VANCOMYCIN 1 SENSITIVE Sensitive     TRIMETH/SULFA <=10 SENSITIVE Sensitive     CLINDAMYCIN <=0.25 SENSITIVE Sensitive     RIFAMPIN <=0.5 SENSITIVE Sensitive     Inducible Clindamycin NEGATIVE Sensitive     * METHICILLIN RESISTANT STAPHYLOCOCCUS AUREUS  MRSA PCR Screening     Status: None   Collection  Time: 02/18/17 11:22 PM  Result Value Ref Range Status   MRSA by PCR  NEGATIVE NEGATIVE Final    Comment:        The GeneXpert MRSA Assay (FDA approved for NASAL specimens only), is one component of a comprehensive MRSA colonization surveillance program. It is not intended to diagnose MRSA infection nor to guide or monitor treatment for MRSA infections.   MRSA PCR Screening     Status: Abnormal   Collection Time: 02/18/17 11:22 PM  Result Value Ref Range Status   MRSA by PCR POSITIVE (A) NEGATIVE Final    Comment:        The GeneXpert MRSA Assay (FDA approved for NASAL specimens only), is one component of a comprehensive MRSA colonization surveillance program. It is not intended to diagnose MRSA infection nor to guide or monitor treatment for MRSA infections. RESULT CALLED TO, READ BACK BY AND VERIFIED WITH: DULLA,R RN [id number] 02/19/17 MITCHELL,L   Culture, blood (routine x 2)     Status: None   Collection Time: 02/20/17  5:20 AM  Result Value Ref Range Status   Specimen Description BLOOD LEFT ANTECUBITAL  Final   Special Requests   Final    BOTTLES DRAWN AEROBIC AND ANAEROBIC Blood Culture adequate volume   Culture NO GROWTH 5 DAYS  Final   Report Status 02/25/2017 FINAL  Final  Culture, blood (routine x 2)     Status: None   Collection Time: 02/20/17  5:21 AM  Result Value Ref Range Status   Specimen Description BLOOD LEFT HAND  Final   Special Requests   Final    BOTTLES DRAWN AEROBIC ONLY Blood Culture adequate volume   Culture NO GROWTH 5 DAYS  Final   Report Status 02/25/2017 FINAL  Final      Radiology Studies: No results found.   Scheduled Meds: . aspirin EC  325 mg Oral Daily  . atorvastatin  40 mg Oral q1800  . carvedilol  12.5 mg Oral 2 times per day on Sun Tue Thu Sat  . cinacalcet  30 mg Oral Q breakfast  . clopidogrel  75 mg Oral Daily  . darbepoetin (ARANESP) injection - DIALYSIS  60 mcg Intravenous Q Fri-HD  . [START ON 03/01/2017] docusate sodium  100 mg Oral Daily  . doxercalciferol  1.5 mcg Intravenous Q  M,W,F-HD  . feeding supplement (NEPRO CARB STEADY)  237 mL Oral Q1500  . feeding supplement (PRO-STAT SUGAR FREE 64)  30 mL Oral Daily  . finasteride  5 mg Oral Daily  . [START ON 03/01/2017] heparin  5,000 Units Subcutaneous Q8H  . insulin aspart  0-5 Units Subcutaneous QHS  . insulin aspart  0-9 Units Subcutaneous TID WC  . insulin glargine  15 Units Subcutaneous QHS  . multivitamin  1 tablet Oral Q M,W,F-2000  . pantoprazole  40 mg Oral Daily  . polyethylene glycol  34 g Oral BID  . senna  2 tablet Oral QHS  . sevelamer carbonate  2,400 mg Oral TID WC  . sodium chloride flush  3 mL Intravenous Q12H  . triamcinolone 0.1 % cream : eucerin   Topical TID   Continuous Infusions: . sodium chloride 0 mL/hr at 02/28/17 0830  . sodium chloride Stopped (02/23/17 2300)  . sodium chloride    . sodium chloride    . magnesium sulfate 1 - 4 g bolus IVPB    . vancomycin 1,000 mg (02/26/17 1531)     LOS: 10 days  Time spent: 30 min    Janece Canterbury, MD [facility] Pager [phone]  If 7PM-7AM, please contact night-coverage [email or web address] Password [facility] 02/28/2017, 2:32 PM

## 2017-02-28 NOTE — Transfer of Care (Signed)
Immediate Anesthesia Transfer of Care Note  Patient: Jacob [name]  Procedure(s) Performed: Procedure(s): RIGHT BELOW KNEE AMPUTATION (Right)  Patient Location: PACU  Anesthesia Type:General  Level of Consciousness: awake, alert , oriented and drowsy  Airway & Oxygen Therapy: Patient Spontanous Breathing and Patient connected to nasal cannula oxygen  Post-op Assessment: Report given to RN, Post -op Vital signs reviewed and stable, Patient moving all extremities and Patient moving all extremities X 4  Post vital signs: Reviewed and stable  Last Vitals:  Vitals:   02/28/17 0451 02/28/17 0954  BP: (!) 104/42 (!) 113/47  Pulse: 70 62  Resp: 20 (!) 21  Temp: 36.5 C     Last Pain:  Vitals:   02/28/17 0541  TempSrc:   PainSc: 3       Patients Stated Pain Goal: 2 (01/02/55 1537)  Complications: No apparent anesthesia complications

## 2017-02-28 NOTE — Progress Notes (Signed)
[facility] ROUNDING NOTE   Subjective:    Interval History: 81M ESRD MWF RUE AVF [place] with R TMA site infection with MRSA Bacteremia  and poor healing. Underwent R BKA     morning 7/29     Objective:  Vital signs in last 24 hours:  Temp:  [97.7 F (36.5 C)-98.1 F (36.7 C)] 97.7 F (36.5 C) (07/29 0451) Pulse Rate:  [66-76] 70 (07/29 0451) Resp:  [18-20] 20 (07/29 0451) BP: (104-174)/(42-85) 104/42 (07/29 0451) SpO2:  [100 %] 100 % (07/29 0451) Weight:  [269 lb 10 oz (122.3 kg)] 269 lb 10 oz (122.3 kg) (07/29 0451)  Weight change: -4 lb 6.6 oz (-2 kg) Filed Weights   02/26/17 1227 02/26/17 1627 02/28/17 0451  Weight: 274 lb 0.5 oz (124.3 kg) 267 lb 13.7 oz (121.5 kg) 269 lb 10 oz (122.3 kg)    Intake/Output: I/O last 3 completed shifts: In: 1572.5 [P.O.:720; I.V.:52.5; IV Piggyback:800] Out: -    Intake/Output this shift:  Total I/O In: 1250 [I.V.:500; IV Piggyback:750] Out: 400 [Blood:400]  GEN: NAD, well appearing ENT: NCAT EYES: EOMI CV: RRR, no rub, nl s1s2 PULM: CTAB, nl wob ABD: s/t/nd' +BS EXT:No LEE  R BKA AVF RUE : +B/T   Basic Metabolic Panel:  Recent Labs Lab 02/22/17 0310 02/24/17 0002 02/26/17 1258  NA 130* 128* 131*  K 3.6 3.9 4.5  CL 92* 92* 94*  CO2 26 25 26   GLUCOSE 71 149* 146*  BUN 47* 38* 38*  CREATININE 8.57* 7.54* 7.98*  CALCIUM 8.0* 8.1* 8.2*  PHOS  --  3.5 3.3    Liver Function Tests:  Recent Labs Lab 02/22/17 0310 02/24/17 0002 02/26/17 1258  AST 26  --   --   ALT 9*  --   --   ALKPHOS 193*  --   --   BILITOT 0.9  --   --   PROT 7.9  --   --   ALBUMIN 1.9* 1.9* 1.8*   No results for input(s): LIPASE, AMYLASE in the last 168 hours. No results for input(s): AMMONIA in the last 168 hours.  CBC:  Recent Labs Lab 02/22/17 0310 02/23/17 0346 02/24/17 0002 02/24/17 0449 02/26/17 1258  WBC 17.1* 19.8* 16.5* 17.9* 16.5*  NEUTROABS  --  15.5*  --  15.2*  --   HGB 6.9* 8.7* 8.7* 8.8* 8.4*   HCT 22.7* 27.8* 27.9* 28.5* 27.6*  MCV 80.5 81.8 82.3 82.1 82.6  PLT 420* 508* 482* 535* 581*    Cardiac Enzymes: No results for input(s): CKTOTAL, CKMB, CKMBINDEX, TROPONINI in the last 168 hours.  BNP: Invalid input(s): POCBNP  CBG:  Recent Labs Lab 02/26/17 2052 02/27/17 0613 02/27/17 1215 02/27/17 1711 02/27/17 2114  GLUCAP 159* 153* 147* 181* 178*    Microbiology: Results for orders placed or performed during the hospital encounter of 02/18/17  Culture, blood (routine x 2)     Status: Abnormal   Collection Time: 02/18/17  6:21 PM  Result Value Ref Range Status   Specimen Description BLOOD LEFT HAND  Final   Special Requests IN PEDIATRIC BOTTLE Blood Culture adequate volume  Final   Culture  Setup Time   Final    GRAM POSITIVE COCCI IN CLUSTERS AEROBIC BOTTLE ONLY CRITICAL RESULT CALLED TO, READ BACK BY AND VERIFIED WITH: L. Huntley Dec.D. 15:30 02/19/17 ([email or web address])    Culture (A)  Final    STAPHYLOCOCCUS AUREUS SUSCEPTIBILITIES PERFORMED ON PREVIOUS CULTURE WITHIN THE LAST 5 DAYS.  Report Status 02/21/2017 FINAL  Final  Blood Culture ID Panel (Reflexed)     Status: Abnormal   Collection Time: 02/18/17  6:21 PM  Result Value Ref Range Status   Enterococcus species NOT DETECTED NOT DETECTED Final   Listeria monocytogenes NOT DETECTED NOT DETECTED Final   Staphylococcus species DETECTED (A) NOT DETECTED Final    Comment: CRITICAL RESULT CALLED TO, READ BACK BY AND VERIFIED WITH: L. Huntley Dec.D. 15:30 02/19/17 ([email or web address])    Staphylococcus aureus DETECTED (A) NOT DETECTED Final    Comment: Methicillin (oxacillin)-resistant Staphylococcus aureus (MRSA). MRSA is predictably resistant to beta-lactam antibiotics (except ceftaroline). Preferred therapy is vancomycin unless clinically contraindicated. Patient requires contact precautions if  hospitalized. CRITICAL RESULT CALLED TO, READ BACK BY AND VERIFIED WITH: L. Huntley Dec.D. 15:30 02/19/17 ([email or web address])     Methicillin resistance DETECTED (A) NOT DETECTED Final    Comment: CRITICAL RESULT CALLED TO, READ BACK BY AND VERIFIED WITH: L. Huntley Dec.D. 15:30 02/19/17 ([email or web address])    Streptococcus species NOT DETECTED NOT DETECTED Final   Streptococcus agalactiae NOT DETECTED NOT DETECTED Final   Streptococcus pneumoniae NOT DETECTED NOT DETECTED Final   Streptococcus pyogenes NOT DETECTED NOT DETECTED Final   Acinetobacter baumannii NOT DETECTED NOT DETECTED Final   Enterobacteriaceae species NOT DETECTED NOT DETECTED Final   Enterobacter cloacae complex NOT DETECTED NOT DETECTED Final   Escherichia coli NOT DETECTED NOT DETECTED Final   Klebsiella oxytoca NOT DETECTED NOT DETECTED Final   Klebsiella pneumoniae NOT DETECTED NOT DETECTED Final   Proteus species NOT DETECTED NOT DETECTED Final   Serratia marcescens NOT DETECTED NOT DETECTED Final   Haemophilus influenzae NOT DETECTED NOT DETECTED Final   Neisseria meningitidis NOT DETECTED NOT DETECTED Final   Pseudomonas aeruginosa NOT DETECTED NOT DETECTED Final   Candida albicans NOT DETECTED NOT DETECTED Final   Candida glabrata NOT DETECTED NOT DETECTED Final   Candida krusei NOT DETECTED NOT DETECTED Final   Candida parapsilosis NOT DETECTED NOT DETECTED Final   Candida tropicalis NOT DETECTED NOT DETECTED Final  Culture, blood (routine x 2)     Status: Abnormal   Collection Time: 02/18/17  9:34 PM  Result Value Ref Range Status   Specimen Description BLOOD LEFT ARM  Final   Special Requests   Final    BOTTLES DRAWN AEROBIC AND ANAEROBIC Blood Culture adequate volume   Culture  Setup Time   Final    GRAM POSITIVE COCCI IN BOTH AEROBIC AND ANAEROBIC BOTTLES CRITICAL RESULT CALLED TO, READ BACK BY AND VERIFIED WITH: L. Huntley Dec.D. 15:30 02/19/17 ([email or web address])    Culture METHICILLIN RESISTANT STAPHYLOCOCCUS AUREUS (A)  Final   Report Status 02/21/2017 FINAL  Final   Organism ID, Bacteria METHICILLIN RESISTANT STAPHYLOCOCCUS AUREUS  Final       Susceptibility   Methicillin resistant staphylococcus aureus - MIC*    CIPROFLOXACIN >=8 RESISTANT Resistant     ERYTHROMYCIN >=8 RESISTANT Resistant     GENTAMICIN <=0.5 SENSITIVE Sensitive     OXACILLIN >=4 RESISTANT Resistant     TETRACYCLINE <=1 SENSITIVE Sensitive     VANCOMYCIN 1 SENSITIVE Sensitive     TRIMETH/SULFA <=10 SENSITIVE Sensitive     CLINDAMYCIN <=0.25 SENSITIVE Sensitive     RIFAMPIN <=0.5 SENSITIVE Sensitive     Inducible Clindamycin NEGATIVE Sensitive     * METHICILLIN RESISTANT STAPHYLOCOCCUS AUREUS  MRSA PCR Screening     Status: None   Collection Time: 02/18/17 11:22 PM  Result Value Ref Range Status   MRSA  by PCR NEGATIVE NEGATIVE Final    Comment:        The GeneXpert MRSA Assay (FDA approved for NASAL specimens only), is one component of a comprehensive MRSA colonization surveillance program. It is not intended to diagnose MRSA infection nor to guide or monitor treatment for MRSA infections.   MRSA PCR Screening     Status: Abnormal   Collection Time: 02/18/17 11:22 PM  Result Value Ref Range Status   MRSA by PCR POSITIVE (A) NEGATIVE Final    Comment:        The GeneXpert MRSA Assay (FDA approved for NASAL specimens only), is one component of a comprehensive MRSA colonization surveillance program. It is not intended to diagnose MRSA infection nor to guide or monitor treatment for MRSA infections. RESULT CALLED TO, READ BACK BY AND VERIFIED WITH: DULLA,R RN 2353 02/19/17 MITCHELL,L   Culture, blood (routine x 2)     Status: None   Collection Time: 02/20/17  5:20 AM  Result Value Ref Range Status   Specimen Description BLOOD LEFT ANTECUBITAL  Final   Special Requests   Final    BOTTLES DRAWN AEROBIC AND ANAEROBIC Blood Culture adequate volume   Culture NO GROWTH 5 DAYS  Final   Report Status 02/25/2017 FINAL  Final  Culture, blood (routine x 2)     Status: None   Collection Time: 02/20/17  5:21 AM  Result Value Ref Range Status    Specimen Description BLOOD LEFT HAND  Final   Special Requests   Final    BOTTLES DRAWN AEROBIC ONLY Blood Culture adequate volume   Culture NO GROWTH 5 DAYS  Final   Report Status 02/25/2017 FINAL  Final    Coagulation Studies: No results for input(s): LABPROT, INR in the last 72 hours.  Urinalysis: No results for input(s): COLORURINE, LABSPEC, PHURINE, GLUCOSEU, HGBUR, BILIRUBINUR, KETONESUR, PROTEINUR, UROBILINOGEN, NITRITE, LEUKOCYTESUR in the last 72 hours.  Invalid input(s): APPERANCEUR    Imaging: No results found.   Medications:   . [MAR Hold] sodium chloride 0 mL/hr at 02/28/17 0830  . [MAR Hold] sodium chloride Stopped (02/23/17 2300)  . [MAR Hold] vancomycin 1,000 mg (02/26/17 1531)   . [MAR Hold] aspirin EC  325 mg Oral Daily  . [MAR Hold] atorvastatin  40 mg Oral q1800  . [MAR Hold] carvedilol  12.5 mg Oral 2 times per day on Sun Tue Thu Sat  . [MAR Hold] cinacalcet  30 mg Oral Q breakfast  . [MAR Hold] clopidogrel  75 mg Oral Daily  . [MAR Hold] darbepoetin (ARANESP) injection - DIALYSIS  60 mcg Intravenous Q Fri-HD  . [MAR Hold] doxercalciferol  1.5 mcg Intravenous Q M,W,F-HD  . [MAR Hold] feeding supplement (NEPRO CARB STEADY)  237 mL Oral Q1500  . [MAR Hold] feeding supplement (PRO-STAT SUGAR FREE 64)  30 mL Oral Daily  . [MAR Hold] finasteride  5 mg Oral Daily  . [MAR Hold] heparin  5,000 Units Subcutaneous Q8H  . [MAR Hold] insulin aspart  0-5 Units Subcutaneous QHS  . [MAR Hold] insulin aspart  0-9 Units Subcutaneous TID WC  . [MAR Hold] insulin glargine  15 Units Subcutaneous QHS  . [MAR Hold] multivitamin  1 tablet Oral Q M,W,F-2000  . [MAR Hold] pantoprazole  40 mg Oral Daily  . [MAR Hold] polyethylene glycol  34 g Oral BID  . [MAR Hold] senna  2 tablet Oral QHS  . [MAR Hold] sevelamer carbonate  2,400 mg Oral TID WC  . [facility]  Hold] sodium chloride flush  3 mL Intravenous Q12H  . [MAR Hold] triamcinolone 0.1 % cream : eucerin   Topical TID   [MAR  Hold] sodium chloride, [MAR Hold] sodium chloride, 0.9 % irrigation (POUR BTL), [MAR Hold] acetaminophen, [MAR Hold] albuterol, [MAR Hold] alum & mag hydroxide-simeth, [MAR Hold] bisacodyl, [MAR Hold] dextromethorphan, [MAR Hold] hydrALAZINE, [MAR Hold] HYDROcodone-acetaminophen, [MAR Hold] labetalol, [MAR Hold] loratadine, [MAR Hold] metoprolol tartrate, [MAR Hold] ondansetron **OR** [MAR Hold] ondansetron (ZOFRAN) IV, [MAR Hold] phenol, [MAR Hold] sevelamer carbonate, [MAR Hold] sodium chloride flush  Assessment/ Plan:  1. ESRD MWF RUE AVF  2.  s/p RBKA due to MRSA bacteremia and infected R foot 3. MRSA Bacteremia, neg TTE 02/20/17 no indication for TEE Possible discitis noted 7/20 continues on vancomycin 4. Anemia; worsened with acute health issues; TSAT 9% and ferritin 909 Hb 8.7 5. Leukocytosis, neutrophilia 2/2 #2 6. DM2 7. PAD     LOS: 2 Edgel Degnan W @TODAY @9 :19 AM

## 2017-02-28 NOTE — Anesthesia Procedure Notes (Signed)
Procedure Name: LMA Insertion Date/Time: 02/28/2017 7:50 AM Performed by: Izora Gala Pre-anesthesia Checklist: Patient identified, Emergency Drugs available, Suction available and Patient being monitored Patient Re-evaluated:Patient Re-evaluated prior to induction Oxygen Delivery Method: Circle system utilized Preoxygenation: Pre-oxygenation with 100% oxygen Induction Type: IV induction Ventilation: Mask ventilation without difficulty Laser Tube: Cuffed inflated with minimal occlusive pressure - saline Placement Confirmation: positive ETCO2

## 2017-02-28 NOTE — Interval H&P Note (Signed)
History and Physical Interval Note:  02/28/2017 7:38 AM  Jacob [name]  has presented today for surgery, with the diagnosis of wound infection  The various methods of treatment have been discussed with the patient and family. After consideration of risks, benefits and other options for treatment, the patient has consented to  Procedure(s): AMPUTATION BELOW KNEE (Right) as a surgical intervention .  The patient's history has been reviewed, patient examined, no change in status, stable for surgery.  I have reviewed the patient's chart and labs.  Questions were answered to the patient's satisfaction.    The patient was scheduled for surgery Friday. He declined surgery and wanted comfort care only. He has reconsidered and now wishes to proceed with right below-knee amputation. He had a meeting with psychiatry yesterday to confirm competence. He is for right below-knee of dictation this morning. He expresses understanding to me today.    Curt Jews

## 2017-03-01 ENCOUNTER — Encounter (HOSPITAL_COMMUNITY): Payer: Self-pay | Admitting: Vascular Surgery

## 2017-03-01 ENCOUNTER — Other Ambulatory Visit: Payer: Self-pay | Admitting: *Deleted

## 2017-03-01 DIAGNOSIS — D638 Anemia in other chronic diseases classified elsewhere: Secondary | ICD-10-CM

## 2017-03-01 DIAGNOSIS — E119 Type 2 diabetes mellitus without complications: Secondary | ICD-10-CM

## 2017-03-01 DIAGNOSIS — L089 Local infection of the skin and subcutaneous tissue, unspecified: Secondary | ICD-10-CM

## 2017-03-01 DIAGNOSIS — B999 Unspecified infectious disease: Secondary | ICD-10-CM

## 2017-03-01 DIAGNOSIS — I739 Peripheral vascular disease, unspecified: Secondary | ICD-10-CM | POA: Diagnosis present

## 2017-03-01 DIAGNOSIS — I10 Essential (primary) hypertension: Secondary | ICD-10-CM

## 2017-03-01 DIAGNOSIS — D72829 Elevated white blood cell count, unspecified: Secondary | ICD-10-CM

## 2017-03-01 DIAGNOSIS — D62 Acute posthemorrhagic anemia: Secondary | ICD-10-CM | POA: Diagnosis present

## 2017-03-01 DIAGNOSIS — G8918 Other acute postprocedural pain: Secondary | ICD-10-CM | POA: Diagnosis present

## 2017-03-01 DIAGNOSIS — T148XXA Other injury of unspecified body region, initial encounter: Secondary | ICD-10-CM

## 2017-03-01 LAB — BASIC METABOLIC PANEL
ANION GAP: 10 (ref 5–15)
BUN: 48 mg/dL — ABNORMAL HIGH (ref 6–20)
CALCIUM: 8 mg/dL — AB (ref 8.9–10.3)
CO2: 25 mmol/L (ref 22–32)
Chloride: 93 mmol/L — ABNORMAL LOW (ref 101–111)
Creatinine, Ser: 8.39 mg/dL — ABNORMAL HIGH (ref 0.61–1.24)
GFR, EST AFRICAN AMERICAN: 7 mL/min — AB (ref >60)
GFR, EST NON AFRICAN AMERICAN: 6 mL/min — AB (ref >60)
Glucose, Bld: 122 mg/dL — ABNORMAL HIGH (ref 65–99)
Potassium: 5.3 mmol/L — ABNORMAL HIGH (ref 3.5–5.1)
SODIUM: 128 mmol/L — AB (ref 135–145)

## 2017-03-01 LAB — CBC
HCT: 25.8 % — ABNORMAL LOW (ref 39.0–52.0)
HEMOGLOBIN: 7.9 g/dL — AB (ref 13.0–17.0)
MCH: 25.6 pg — ABNORMAL LOW (ref 26.0–34.0)
MCHC: 30.6 g/dL (ref 30.0–36.0)
MCV: 83.8 fL (ref 78.0–100.0)
Platelets: 456 10*3/uL — ABNORMAL HIGH (ref 150–400)
RBC: 3.08 MIL/uL — AB (ref 4.22–5.81)
RDW: 18.4 % — ABNORMAL HIGH (ref 11.5–15.5)
WBC: 16 10*3/uL — AB (ref 4.0–10.5)

## 2017-03-01 LAB — GLUCOSE, CAPILLARY
Glucose-Capillary: 144 mg/dL — ABNORMAL HIGH (ref 65–99)
Glucose-Capillary: 137 mg/dL — ABNORMAL HIGH (ref 65–99)
Glucose-Capillary: 120 mg/dL — ABNORMAL HIGH (ref 65–99)

## 2017-03-01 LAB — VANCOMYCIN, RANDOM: VANCOMYCIN RM: 27

## 2017-03-01 MED ORDER — DOXERCALCIFEROL 4 MCG/2ML IV SOLN
INTRAVENOUS | Status: AC
  Administered 2017-03-01: 1.5 ug via INTRAVENOUS
  Filled 2017-03-01: qty 2

## 2017-03-01 MED ORDER — HYDROMORPHONE HCL 1 MG/ML IJ SOLN
INTRAMUSCULAR | Status: AC
  Administered 2017-03-01: 1 mg via INTRAVENOUS
  Filled 2017-03-01: qty 1

## 2017-03-01 MED ORDER — OXYCODONE-ACETAMINOPHEN 5-325 MG PO TABS
ORAL_TABLET | ORAL | Status: AC
  Administered 2017-03-01: 2 via ORAL
  Filled 2017-03-01: qty 2

## 2017-03-01 MED ORDER — VANCOMYCIN HCL IN DEXTROSE 1-5 GM/200ML-% IV SOLN
INTRAVENOUS | Status: AC
  Administered 2017-03-01: 1 g via INTRAVENOUS
  Filled 2017-03-01: qty 200

## 2017-03-01 MED ORDER — DARBEPOETIN ALFA 100 MCG/0.5ML IJ SOSY: 100.0000 ug | PREFILLED_SYRINGE | INTRAMUSCULAR | Status: DC

## 2017-03-01 NOTE — Progress Notes (Signed)
Subjective:  Seen in HD- comfortable  Objective Vital signs in last 24 hours: Vitals:   02/28/17 2233 03/01/17 0339 03/01/17 0500 03/01/17 0831  BP: (!) 124/51 131/84  (!) 122/44  Pulse: 62 88  64  Resp: 17 13  15   Temp: 97.7 F (36.5 C) 98.5 F (36.9 C)  98 F (36.7 C)  TempSrc: Oral Oral  Oral  SpO2: 98%   100%  Weight:   119.6 kg (263 lb 10.7 oz)   Height:       Weight change: -2.7 kg (-5 lb 15.2 oz)  Intake/Output Summary (Last 24 hours) at 03/01/17 0930 Last data filed at 03/01/17 7824  Gross per 24 hour  Intake              720 ml  Output                0 ml  Net              720 ml    Assessment/ Plan: Pt is a 66 y.o. yo male with ESRD who was admitted on 02/18/2017 with gangrene- s/p right BKA on 7/29  Assessment/Plan: 1. S/p right BKA- per [facility] and rehab- POD #1 2. ESRD - normally MWF- [facility] - HD today via AVF- continue on schedule   3. Anemia- hgb in the 7's- post op and CKD- on relatively low dose aranesp- inc 4. Secondary hyperparathyroidism- continue home sensipar , hectorol and renvela- phos, calc  good  5. HTN/volume- seems OK- on oral coreg   Krissy Orebaugh A    Labs: Basic Metabolic Panel:  Recent Labs Lab 02/24/17 0002 02/26/17 1258 02/28/17 0723 02/28/17 1341 03/01/17 0633  NA 128* 131* 134* 131* 128*  K 3.9 4.5 4.9 4.5 5.3*  CL 92* 94*  --  94* 93*  CO2 25 26  --  25 25  GLUCOSE 149* 146* 178* 156* 122*  BUN 38* 38*  --  39* 48*  CREATININE 7.54* 7.98*  --  7.37* 8.39*  CALCIUM 8.1* 8.2*  --  8.1* 8.0*  PHOS 3.5 3.3  --  3.6  --    Liver Function Tests:  Recent Labs Lab 02/24/17 0002 02/26/17 1258 02/28/17 1341  ALBUMIN 1.9* 1.8* 2.0*   No results for input(s): LIPASE, AMYLASE in the last 168 hours. No results for input(s): AMMONIA in the last 168 hours. CBC:  Recent Labs Lab 02/23/17 0346 02/24/17 0002 02/24/17 0449 02/26/17 1258 02/28/17 0723 02/28/17 1346 03/01/17 0633  WBC 19.8* 16.5* 17.9* 16.5*   --  17.8* 16.0*  NEUTROABS 15.5*  --  15.2*  --   --   --   --   HGB 8.7* 8.7* 8.8* 8.4* 10.9* 7.8* 7.9*  HCT 27.8* 27.9* 28.5* 27.6* 32.0* 26.1* 25.8*  MCV 81.8 82.3 82.1 82.6  --  83.1 83.8  PLT 508* 482* 535* 581*  --  510* 456*   Cardiac Enzymes: No results for input(s): CKTOTAL, CKMB, CKMBINDEX, TROPONINI in the last 168 hours. CBG:  Recent Labs Lab 02/28/17 1001 02/28/17 1214 02/28/17 1659 02/28/17 2235 03/01/17 0608  GLUCAP 141* 164* 131* 113* 144*    Iron Studies: No results for input(s): IRON, TIBC, TRANSFERRIN, FERRITIN in the last 72 hours. Studies/Results: No results found. Medications: Infusions: . sodium chloride 0 mL/hr at 02/28/17 0830  . sodium chloride Stopped (02/23/17 2300)  . sodium chloride    . sodium chloride    . magnesium sulfate 1 - 4 g bolus IVPB    .  vancomycin 1,000 mg (02/26/17 1531)    Scheduled Medications: . aspirin EC  325 mg Oral Daily  . atorvastatin  40 mg Oral q1800  . carvedilol  12.5 mg Oral 2 times per day on Sun Tue Thu Sat  . cinacalcet  30 mg Oral Q breakfast  . clopidogrel  75 mg Oral Daily  . darbepoetin (ARANESP) injection - DIALYSIS  60 mcg Intravenous Q Fri-HD  . docusate sodium  100 mg Oral Daily  . doxercalciferol  1.5 mcg Intravenous Q M,W,F-HD  . feeding supplement (NEPRO CARB STEADY)  237 mL Oral Q1500  . feeding supplement (PRO-STAT SUGAR FREE 64)  30 mL Oral Daily  . finasteride  5 mg Oral Daily  . heparin  5,000 Units Subcutaneous Q8H  . insulin aspart  0-5 Units Subcutaneous QHS  . insulin aspart  0-9 Units Subcutaneous TID WC  . insulin glargine  15 Units Subcutaneous QHS  . multivitamin  1 tablet Oral Q M,W,F-2000  . pantoprazole  40 mg Oral Daily  . polyethylene glycol  34 g Oral BID  . senna  2 tablet Oral QHS  . sevelamer carbonate  2,400 mg Oral TID WC  . sodium chloride flush  3 mL Intravenous Q12H  . triamcinolone 0.1 % cream : eucerin   Topical TID    have reviewed scheduled and prn  medications.  Physical Exam: General: NAD Heart: RRR Lungs: mostly clear Abdomen: obese, soft Extremities: minimal edema  Dialysis Access: avf, accessed     03/01/2017,9:30 AM  LOS: 11 days

## 2017-03-01 NOTE — Consult Note (Addendum)
Physical Medicine and Rehabilitation Consult   Reason for Consult: R-BKA and MRSA sepsis Referring Physician: Dr. Donnetta Hutching.    HPI: Jacob [name] is a 66 y.o. male with history of T2DM, ESRD-HD dependent, PAD with poorly healing right foot transmetatarsal amputation with non-healing wound and MRSA sepsis.  History taken from char review. He was started on IV antibiotics but continued to have RLE pain with leucocytosis. MRI lumbar spine showed ill defined fluid or edema in epidural space with question of chronic epidural infection and some progression and development of new area at right L1 epidural space. Dr. Linus Salmons recommended 6 weeks IV vancomycin. Palliative care consulted to help with goals of care and psychiatry felt that patient was competent to make decisions. He elected to undergo R-BKA by Dr. Donnetta Hutching on 02/28/17. MD recommending CIR for intensive rehab program.    Review of Systems  Constitutional: Negative for chills and fever.  Musculoskeletal: Positive for joint pain and myalgias.  Neurological: Negative.  Negative for weakness.  All other systems reviewed and are negative.    Past Medical History:  Diagnosis Date  . Anemia   . Anemia in chronic kidney disease(285.21)   . Anxiety   . Arthritis   . Depression   . Dyspnea    with exertion  . ESRD (end stage renal disease) on dialysis [facility])    "MWF; [facility] [place]" (02/18/2017)  . Essential hypertension   . GERD (gastroesophageal reflux disease)   . Glaucoma   . History of blood transfusion   . Insomnia   . Nonischemic cardiomyopathy ([place])   . Stroke [facility]) 2016   - "they said it was not a stroke"  . Type 2 diabetes mellitus ([place])    Type II    Past Surgical History:  Procedure Laterality Date  . A/V SHUNTOGRAM Right 10/06/2016   Procedure: A/V Fistulagram;  Surgeon: [name], MD;  Location: [place] CV LAB;  Service: Cardiovascular;  Laterality: Right;  . ABDOMINAL AORTOGRAM N/A 12/30/2016   Procedure:  Abdominal Aortogram;  Surgeon: Waynetta Sandy, MD;  Location: [place] CV LAB;  Service: Cardiovascular;  Laterality: N/A;  . ABDOMINAL AORTOGRAM W/LOWER EXTREMITY N/A 02/23/2017   Procedure: Abdominal Aortogram w/Lower Extremity;  Surgeon: [name], MD;  Location: [place] CV LAB;  Service: Cardiovascular;  Laterality: N/A;  Rt. leg  . AMPUTATION Right 02/28/2017   Procedure: RIGHT BELOW KNEE AMPUTATION;  Surgeon: [name], MD;  Location: [place];  Service: Vascular;  Laterality: Right;  . AV FISTULA PLACEMENT     Hx: of  . AV FISTULA PLACEMENT Right 05/04/2013   Procedure: ARTERIOVENOUS (AV) FISTULA CREATION- RIGHT ARM;  Surgeon: Conrad Logan, MD;  Location: [place];  Service: Vascular;  Laterality: Right;  Ultrasound guided  . AV FISTULA PLACEMENT Right 07/28/2016   Procedure: BRACHIOCEPHALIC ARTERIOVENOUS (AV) FISTULA CREATION;  Surgeon: [name], MD;  Location: [place];  Service: Vascular;  Laterality: Right;  . CATARACT EXTRACTION W/PHACO Left 07/16/2014   Procedure: CATARACT EXTRACTION PHACO AND INTRAOCULAR LENS PLACEMENT (IOC);  Surgeon: [name], MD;  Location: [facility];  Service: Ophthalmology;  Laterality: Left;  CDE:8.86  . CATARACT EXTRACTION W/PHACO Right 07/30/2014   Procedure: CATARACT EXTRACTION PHACO AND INTRAOCULAR LENS PLACEMENT (IOC);  Surgeon: [name], MD;  Location: [facility];  Service: Ophthalmology;  Laterality: Right;  CDE 8.99  . COLONOSCOPY     Hx: of  . FISTULA SUPERFICIALIZATION Right 07/28/2016  Procedure: FISTULA SUPERFICIALIZATION;  Surgeon: [name], MD;  Location: [place];  Service: Vascular;  Laterality: Right;  . FISTULA SUPERFICIALIZATION Right 10/13/2016   Procedure: BRACHIOCEPHALIC ARTERIOVENOUS FISTULA SUPERFICIALIZATION;  Surgeon: [name], MD;  Location: [place];  Service: Vascular;  Laterality: Right;  . FISTULOGRAM N/A 05/04/2013   Procedure: CENTRAL VENOGRAM;  Surgeon: Conrad Hilldale, MD;   Location: [place];  Service: Vascular;  Laterality: N/A;  . INSERTION OF DIALYSIS CATHETER Right 05/04/2013   Procedure: INSERTION OF DIALYSIS CATHETER;  Surgeon: Conrad [place]el [place], MD;  Location: [place];  Service: Vascular;  Laterality: Right;  Ultrasound guided  . INSERTION OF DIALYSIS CATHETER N/A 07/28/2016   Procedure: INSERTION OF DIALYSIS CATHETER;  Surgeon: [name], MD;  Location: [place];  Service: Vascular;  Laterality: N/A;  . LIGATION OF ARTERIOVENOUS  FISTULA Left 05/04/2013   Procedure: LIGATION OF LEFT RADIAL CEPHALIC ARTERIOVENOUS  FISTULA;  Surgeon: Conrad Germ[place]own, MD;  Location: [place];  Service: Vascular;  Laterality: Left;  Ultrasound guided  . LIGATION OF ARTERIOVENOUS  FISTULA Right 07/28/2016   Procedure: LIGATION OF RADIOCEPHALIC ARTERIOVENOUS  FISTULA;  Surgeon: [name], MD;  Location: [place];  Service: Vascular;  Laterality: Right;  . LOWER EXTREMITY ANGIOGRAPHY N/A 12/30/2016   Procedure: Lower Extremity Angiography;  Surgeon: Waynetta Sandy, MD;  Location: [place] [facility];  Service: Cardiovascular;  Laterality: N/A;  . LUMBAR SPINE SURGERY    . PERIPHERAL VASCULAR ATHERECTOMY Right 12/30/2016   Procedure: Peripheral Vascular Atherectomy;  Surgeon: Waynetta Sandy, MD;  Location: [place] CV LAB;  Service: Cardiovascular;  Laterality: Right;  AT and PT  . PERIPHERAL VASCULAR BALLOON ANGIOPLASTY Right 12/30/2016   Procedure: Peripheral Vascular Balloon Angioplasty;  Surgeon: Waynetta Sandy, MD;  Location: [place] [facility];  Service: Cardiovascular;  Laterality: Right;  PTA of PT and AT  . PERIPHERAL VASCULAR BALLOON ANGIOPLASTY  02/23/2017   Procedure: Peripheral Vascular Balloon Angioplasty;  Surgeon: [name], MD;  Location: [place] CV LAB;  Service: Cardiovascular;;  RT. Anterior Tib.  [name] PERIPHERAL VASCULAR CATHETERIZATION N/A 01/24/2015   Procedure: Fistulagram;  Surgeon: Conrad [place], MD;  Location: [place] CV LAB;  Service: Cardiovascular;  Laterality: N/A;  . PERIPHERAL VASCULAR CATHETERIZATION Right 06/04/2016   Procedure: A/V Shuntogram/Fistulagram;  Surgeon: Conrad [place], MD;  Location: [place] CV [facility];  Service: Cardiovascular;  Laterality: Right;  . REVISON OF ARTERIOVENOUS FISTULA Right 01/02/2014   Procedure: REVISON OF ARTERIOVENOUS FISTULA ANASTOMOSIS;  Surgeon: Conrad [place]ham, MD;  Location: [place];  Service: Vascular;  Laterality: Right;  . REVISON OF ARTERIOVENOUS FISTULA Right 01/02/2016   Procedure: REVISION OF RADIOCEPHALIC ARTERIOVENOUS FISTULA  with BOVINE PATCH ANGIOPLASTY RIGHT RADIAL ARTERY;  Surgeon: Mal [name], MD;  Location: [place];  Service: Vascular;  Laterality: Right;  . SHUNTOGRAM N/A 11/07/2013   Procedure: Fistulogram;  Surgeon: [name], MD;  Location: [facility] C[facility] LAB;  Service: Cardiovascular;  Laterality: N/A;  . TRANSMETATARSAL AMPUTATION Right 01/01/2017   Procedure: TRANSMETATARSAL AMPUTATION;  Surgeon: [name], MD;  Location: [facility] OR;  Service: Vascular;  Laterality: Right;    Family History  Problem Relation Age of Onset  . Heart attack Brother        56  . Heart disease Brother        before age 44  . Hypertension Brother   . Heart attack Brother  92  . Heart attack Brother        48  . Diabetes Mother   . Hypertension Mother   . Heart disease Mother   . Heart disease Father   . Hypertension Father   . Other Father        amputation  . Hypertension Sister   . Heart disease Sister   . Vision loss Maternal Uncle     Social History:  Has been living at [facility] for past couple of months. Per  reports that he has never smoked. He has never used smokeless tobacco. He reports that he drinks alcohol. He reports that he does not use drugs.    Allergies  Allergen Reactions  . Bee Venom Anaphylaxis  . Iodine Swelling  . Penicillins Swelling and Other (See Comments)    SWELLING REACTION UNSPECIFIED   Has patient had a  PCN reaction causing immediate rash, facial/tongue/throat swelling, SOB or lightheadedness with hypotension: No Has patient had a PCN reaction causing severe rash involving mucus membranes or skin necrosis: No Has patient had a PCN reaction that required hospitalization No Has patient had a PCN reaction occurring within the last 10 years: No If all of the above answers are "NO", then may proceed with Cephalosporin use.  . Sulfadiazine Other (See Comments)    1% Silver Sulfadiazine cream causes burning over a large area of skin.   Medications Prior to Admission  Medication Sig Dispense Refill  . acetaminophen (TYLENOL) 500 MG tablet Take 1,000 mg by mouth every 6 (six) hours as needed for mild pain.    [name] aspirin EC 325 MG tablet Take 325 mg by mouth daily.    . B Complex-C-Folic Acid (DIALYVITE TABLET) TABS Take 1 tablet by mouth every Monday, Wednesday, and Friday.     . bisacodyl (DULCOLAX) 5 MG EC tablet Take 2 tablets (10 mg total) by mouth daily as needed for moderate constipation. 30 tablet 0  . cinacalcet (SENSIPAR) 30 MG tablet Take 30 mg by mouth daily.    [name] dextromethorphan (ROBITUSSIN 12 HOUR COUGH) 30 MG/5ML liquid Take by mouth as needed for cough.    . EPIPEN 2-PAK 0.3 MG/0.3ML SOAJ injection Inject 0.3 mg into the muscle once as needed (For anaphylaxis.).     [name] lisinopril (PRINIVIL,ZESTRIL) 20 MG tablet Take 20 mg by mouth daily.    [name] loratadine (CLARITIN) 10 MG tablet Take 10 mg by mouth daily as needed for allergies.    [name] omeprazole (PRILOSEC) 40 MG capsule Take 40 mg by mouth daily.     . ramipril (ALTACE) 10 MG capsule Take 10 mg by mouth daily.     [name] RENVELA 800 MG tablet Take 800-2,400 mg by mouth 3 (three) times daily with meals. Take 2400 mg by mouth 3 times daily with meals and 800 mg by mouth with snacks    . [DISCONTINUED] LANTUS SOLOSTAR 100 UNIT/ML Solostar Pen Inject 20 Units into the skin at bedtime. If blood sugar is lower than 150 only inject 20 units at bedtime     . clopidogrel (PLAVIX) 75 MG tablet Take 1 tablet (75 mg total) by mouth daily with breakfast. (Patient not taking: Reported on 02/18/2017) 30 tablet 0  . divalproex (DEPAKOTE) 500 MG DR tablet Take 1 tablet (500 mg total) by mouth every 12 (twelve) hours. (Patient not taking: Reported on 02/18/2017) 60 tablet 0  . oxyCODONE-acetaminophen (PERCOCET/ROXICET) 5-325 MG tablet Take 1 tablet by mouth every 4 (four) hours as  needed for moderate pain. (Patient not taking: Reported on 02/18/2017) 15 tablet 0    Home: Home Living Family/patient expects to be discharged to:: Skilled nursing facility Living Arrangements: Alone Available Help at Discharge: Family, Available PRN/intermittently Type of Home: Apartment Home Access: Level entry Home Layout: One level Bathroom Shower/Tub: Multimedia programmer: Standard Home Equipment: Environmental consultant - 4 wheels, Cane - single point  Functional History: Prior Function Level of Independence: Independent with assistive device(s) Functional Status:  Mobility: Bed Mobility Overal bed mobility: Needs Assistance Bed Mobility: Supine to Sit Supine to sit: Min guard General bed mobility comments: Min guard for safety. Pt required no physical assist, but required use of bed rails, increased time and VCs for sequencing, HOB 30 degrees with increased time  Transfers Overall transfer level: Needs assistance Transfers: Sit to/from Stand, Stand Pivot Transfers Sit to Stand: Min assist, +2 safety/equipment Stand pivot transfers: Min assist, +2 safety/equipment General transfer comment: Min A to rise. VCs for hand placement. Pt able to step to chair with use of RW and cues for sequence and safety. Pt with RW at edge of chair and became fatigued and began sitting despite cues and assist from P.T. and SPT Pt with slow descent to edge of chair, unable to fully reach edge and continued slow descent to floor. Maximove assist from floor to chair with 3 person assist       ADL:    Cognition: Cognition Overall Cognitive Status: No family/caregiver present to determine baseline cognitive functioning Orientation Level: Oriented X4 Cognition Arousal/Alertness: Awake/alert Behavior During Therapy: Impulsive (Very impulsive with transfers) Overall Cognitive Status: No family/caregiver present to determine baseline cognitive functioning  Blood pressure 123/61, pulse 67, temperature 98.3 F (36.8 C), temperature source Oral, resp. rate 15, height 6\' 2"  (1.88 m), weight 119.3 kg (263 lb 0.1 oz), SpO2 100 %. Physical Exam  Constitutional: He appears well-developed and well-nourished.  HENT:  Head: Normocephalic and atraumatic.  Eyes: EOM are normal. Scleral icterus is present.  Neck: Normal range of motion. Neck supple.  Cardiovascular: Normal rate and regular rhythm.   Respiratory: Effort normal and breath sounds normal.  GI: Soft. Bowel sounds are normal.  Musculoskeletal: He exhibits edema and tenderness.  Right BKA  Neurological: He is alert.  Able to follow commands Motor: B/l UE 5/5 proximal to distal LLE: 4+/5 proximal to distal RLE: HF 5/5, KE 4/5 (pain inhibition)  Skin:  BKA site dressing c/d/i  Psychiatric: His affect is blunt. He is slowed.    Results for orders placed or performed during the hospital encounter of 02/18/17 (from the past 24 hour(s))  Glucose, capillary     Status: Abnormal   Collection Time: 02/28/17 12:14 PM  Result Value Ref Range   Glucose-Capillary 164 (H) 65 - 99 mg/dL   Comment 1 Notify RN    Comment 2 Document in Chart   Renal function panel     Status: Abnormal   Collection Time: 02/28/17  1:41 PM  Result Value Ref Range   Sodium 131 (L) 135 - 145 mmol/L   Potassium 4.5 3.5 - 5.1 mmol/L   Chloride 94 (L) 101 - 111 mmol/L   CO2 25 22 - 32 mmol/L   Glucose, Bld 156 (H) 65 - 99 mg/dL   BUN 39 (H) 6 - 20 mg/dL   Creatinine, Ser 7.37 (H) 0.61 - 1.24 mg/dL   Calcium 8.1 (L) 8.9 - 10.3 mg/dL   Phosphorus 3.6  2.5 - 4.6 mg/dL   Albumin  2.0 (L) 3.5 - 5.0 g/dL   GFR calc non Af Amer 7 (L) >60 mL/min   GFR calc Af Amer 8 (L) >60 mL/min   Anion gap 12 5 - 15  CBC     Status: Abnormal   Collection Time: 02/28/17  1:46 PM  Result Value Ref Range   WBC 17.8 (H) 4.0 - 10.5 K/uL   RBC 3.14 (L) 4.22 - 5.81 MIL/uL   Hemoglobin 7.8 (L) 13.0 - 17.0 g/dL   HCT 26.1 (L) 39.0 - 52.0 %   MCV 83.1 78.0 - 100.0 fL   MCH 24.8 (L) 26.0 - 34.0 pg   MCHC 29.9 (L) 30.0 - 36.0 g/dL   RDW 17.7 (H) 11.5 - 15.5 %   Platelets 510 (H) 150 - 400 K/uL  Glucose, capillary     Status: Abnormal   Collection Time: 02/28/17  4:59 PM  Result Value Ref Range   Glucose-Capillary 131 (H) 65 - 99 mg/dL   Comment 1 Notify RN    Comment 2 Document in Chart   Glucose, capillary     Status: Abnormal   Collection Time: 02/28/17 10:35 PM  Result Value Ref Range   Glucose-Capillary 113 (H) 65 - 99 mg/dL   Comment 1 Notify RN    Comment 2 Document in Chart   Glucose, capillary     Status: Abnormal   Collection Time: 03/01/17  6:08 AM  Result Value Ref Range   Glucose-Capillary 144 (H) 65 - 99 mg/dL  Basic metabolic panel     Status: Abnormal   Collection Time: 03/01/17  6:33 AM  Result Value Ref Range   Sodium 128 (L) 135 - 145 mmol/L   Potassium 5.3 (H) 3.5 - 5.1 mmol/L   Chloride 93 (L) 101 - 111 mmol/L   CO2 25 22 - 32 mmol/L   Glucose, Bld 122 (H) 65 - 99 mg/dL   BUN 48 (H) 6 - 20 mg/dL   Creatinine, Ser 8.39 (H) 0.61 - 1.24 mg/dL   Calcium 8.0 (L) 8.9 - 10.3 mg/dL   GFR calc non Af Amer 6 (L) >60 mL/min   GFR calc Af Amer 7 (L) >60 mL/min   Anion gap 10 5 - 15  CBC     Status: Abnormal   Collection Time: 03/01/17  6:33 AM  Result Value Ref Range   WBC 16.0 (H) 4.0 - 10.5 K/uL   RBC 3.08 (L) 4.22 - 5.81 MIL/uL   Hemoglobin 7.9 (L) 13.0 - 17.0 g/dL   HCT 25.8 (L) 39.0 - 52.0 %   MCV 83.8 78.0 - 100.0 fL   MCH 25.6 (L) 26.0 - 34.0 pg   MCHC 30.6 30.0 - 36.0 g/dL   RDW 18.4 (H) 11.5 - 15.5 %   Platelets 456 (H)  150 - 400 K/uL  Vancomycin, random     Status: None   Collection Time: 03/01/17  9:00 AM  Result Value Ref Range   Vancomycin Rm 27    No results found.  Assessment/Plan: Diagnosis: R-BKA and MRSA sepsis Clean amputation daily with soap and water Monitor incision site for signs of infection or impending skin breakdown. Staples to remain in place for 3-4 weeks Stump shrinker, for edema control  Scar mobilization massaging to prevent soft tissue adherence Stump protector during therapies Prevent flexion contractures by implementing the following:   Encourage prone lying for 20-30 mins per day BID to avoid hip flexion  Contractures if medically appropriate;  Avoid pillow under knees  when patient is lying in bed in order to prevent both  knee and hip flexion contractures;  Avoid prolonged sitting Post surgical pain control with oral medication Phantom limb pain control with physical modalities including desensitization techniques (gentle self massage to the residual stump,hot packs if sensation intact, [place]) and mirror therapy, TENS. If ineffective, consider pharmacological treatment for neuropathic pain (e.g gabapentin, pregabalin, amytriptalyine, duloxetine).  When using wheelchair, patient should have knee on amputated side fully extended with board under the seat cushion. Avoid injury to contralateral side  1. Does the need for close, 24 hr/day medical supervision in concert with the patient's rehab needs make it unreasonable for this patient to be served in a less intensive setting? Yes  2. Co-Morbidities requiring supervision/potential complications: T2 DM (Monitor in accordance with exercise and adjust meds as necessary), ESRD-HD (recs per Nephro), PAD (cont meds), chronic epidural infection (cont IV Vanc per ID), post-op pain (Biofeedback training with therapies to help reduce reliance on opiate pain medications, particularly IV dilaudid, monitor pain control during therapies, and  sedation at rest and titrate to maximum efficacy to ensure participation and gains in therapies), ABLA on chronic anemia (transfuse if necessary to ensure appropriate perfusion for increased activity tolerance), leukocytosis (cont to monitor for signs and symptoms of infection, further workup if indicated) 3. Due to safety, skin/wound care, disease management, pain management and patient education, does the patient require 24 hr/day rehab nursing? Yes 4. Does the patient require coordinated care of a physician, rehab nurse, PT (1-2 hrs/day, 5 days/week) and OT (1-2 hrs/day, 5 days/week) to address physical and functional deficits in the context of the above medical diagnosis(es)? Yes Addressing deficits in the following areas: balance, endurance, locomotion, strength, transferring, bathing, dressing, toileting and psychosocial support 5. Can the patient actively participate in an intensive therapy program of at least 3 hrs of therapy per day at least 5 days per week? Potentially 6. The potential for patient to make measurable gains while on inpatient rehab is excellent 7. Anticipated functional outcomes upon discharge from inpatient rehab are supervision and min assist  with PT, supervision and min assist with OT, n/a with SLP. 8. Estimated rehab length of stay to reach the above functional goals is: 10-14 days. 9. Anticipated D/C setting: Other 10. Anticipated post D/C treatments: SNF 11. Overall Rehab/Functional Prognosis: good  RECOMMENDATIONS: This patient's condition is appropriate for continued rehabilitative care in the following setting: Will await eval by therapies.  Likely CIR.  Patient has agreed to participate in recommended program. Potentially Note that insurance prior authorization may be required for reimbursement for recommended care.  Comment: Rehab Admissions Coordinator to follow up.  Delice Lesch, MD, [place], [place] 03/01/2017

## 2017-03-01 NOTE — Progress Notes (Signed)
Physical Therapy Treatment Patient Details Name: Jacob [name] MRN: [id number] DOB: 05/24/1951 Today's Date: 03/01/2017    History of Present Illness Patient is a 66 yo male admitted for Rt foot cellulitis with history of transmet. pt declining progression to BKA. PMH:  HTN, DM, anemia, ESRD on HD, anxiety, depression, CVA, NICM, arthritis    PT Comments    PT visit x 2. Pt is making progress towards his goals. Pt limited in mobility by his ongoing diarrhea and as such only transferred to and from the bedside commode. Pt currently, modAx2 for bed mobility and for anterior posterior scoot to bedside commode with LE remaining on bed for comfort.  Pt required modAx2 for transfer back into bed after adequate time to void. Pt requires skilled PT to progress mobility and LE strength and endurance to safely mobilize in his discharge environment.    Follow Up Recommendations  SNF     Equipment Recommendations  None recommended by PT    Recommendations for Other Services       Precautions / Restrictions Precautions Precautions: Fall Restrictions Weight Bearing Restrictions: No    Mobility  Bed Mobility Overal bed mobility: Needs Assistance Bed Mobility: Supine to Sit;Sit to Supine;Rolling Rolling: Mod assist   Supine to sit: Min assist Sit to supine: Mod assist;+2 for physical assistance   General bed mobility comments: modA for rolling L and R for pericare, minAx2 for trunk to upright with supine to sit and modAx2 for pad scoot up in bed and centering in bed  Transfers Overall transfer level: Needs assistance Equipment used: None Transfers: Comptroller transfers: Mod assist;+2 physical assistance   General transfer comment: modAx2 for off weighting bottom to scoot backward and forward to bedside commode           Balance Overall balance assessment: Needs assistance Sitting-balance support: Feet supported;Bilateral upper  extremity supported Sitting balance-Leahy Scale: Fair                                      Cognition Arousal/Alertness: Awake/alert Behavior During Therapy: Impulsive (poor problem solving skills) Overall Cognitive Status: No family/caregiver present to determine baseline cognitive functioning                                           General Comments General comments (skin integrity, edema, etc.): VSS      Pertinent Vitals/Pain Pain Assessment: 0-10 Pain Score: 7  Pain Location: R LE surgical site Pain Descriptors / Indicators: Operative site guarding;Grimacing;Guarding;Sore           PT Goals (current goals can now be found in the care plan section) Acute Rehab PT Goals PT Goal Formulation: With patient Time For Goal Achievement: 03/11/17 Potential to Achieve Goals: Good Progress towards PT goals: Progressing toward goals    Frequency    Min 3X/week      PT Plan Current plan remains appropriate    Co-evaluation PT/OT/SLP Co-Evaluation/Treatment: Yes Reason for Co-Treatment: For patient/therapist safety PT goals addressed during session: Mobility/safety with mobility        AM-PAC PT "6 Clicks" Daily Activity  Outcome Measure  Difficulty turning over in bed (including adjusting bedclothes, sheets and blankets)?: Total Difficulty moving from lying on back to sitting on the  side of the bed? : Total Difficulty sitting down on and standing up from a chair with arms (e.g., wheelchair, bedside commode, etc,.)?: Total Help needed moving to and from a bed to chair (including a wheelchair)?: A Lot Help needed walking in hospital room?: Total Help needed climbing 3-5 steps with a railing? : Total 6 Click Score: 7    End of Session   Activity Tolerance: Patient limited by fatigue Patient left: in bed;with call bell/phone within reach;with nursing/sitter in room Nurse Communication: Mobility status PT Visit Diagnosis: Other  abnormalities of gait and mobility (R26.89);Pain;Muscle weakness (generalized) (M62.81) Pain - Right/Left: Right Pain - part of body: Leg     Time:1627-1655 and  1705-1717 PT Time Calculation (min) (ACUTE ONLY): 12 min and 28 min  Charges:   $Therapeutic Activity: 23-37 mins                    G Codes:       Jentri Aye B. Migdalia Dk PT, DPT Acute Rehabilitation  [phone] Pager [phone]     [place] 03/01/2017, 6:31 PM

## 2017-03-01 NOTE — Patient Outreach (Signed)
Pt discharged from hospital 02/26/17 and transferred to SNF, RN CM sent in basket to [place] to notify of admit to SNF.  RN CM closed case for nursing and CSW to follow.  Jacqlyn Larsen [facility], [place] Coordinator [phone]

## 2017-03-01 NOTE — Progress Notes (Signed)
Pharmacy Antibiotic Note  Jacob [name] is a 66 y.o. male admitted on 02/18/2017 with MRSA bacteremia and osteomyelitis of the right foot.  S/p right BKA on 7/29. Pharmacy has been consulted for vancomyin dosing.  ESRD patient on HD (MWF), tolerating full HD sessions Vancomycin pre-HD level this AM = 27, slightly above goal, will not change given MRSA bacteremia and osteomyelitis   Plan: Continue Vancomycin 1 gram IV qHD-MWF - end date 8/31 per ID Follow HD schedule/tolerance - history of refusing HD Monitor electrolytes, clinical progress  Height: 6\' 2"  (188 cm) Weight: 263 lb 0.1 oz (119.3 kg) IBW/kg (Calculated) : 82.2  Temp (24hrs), Avg:98 F (36.7 C), Min:97.6 F (36.4 C), Max:98.5 F (36.9 C)   Recent Labs Lab 02/24/17 0002 02/24/17 0449 02/25/17 0340 02/26/17 1258 02/28/17 1341 02/28/17 1346 03/01/17 0633 03/01/17 0900  WBC 16.5* 17.9*  --  16.5*  --  17.8* 16.0*  --   CREATININE 7.54*  --   --  7.98* 7.37*  --  8.39*  --   VANCORANDOM  --   --  22  --   --   --   --  27    Estimated Creatinine Clearance: 11.7 mL/min (A) (by C-G formula based on SCr of 8.39 mg/dL (H)).    Allergies  Allergen Reactions  . Bee Venom Anaphylaxis  . Iodine Swelling  . Penicillins Swelling and Other (See Comments)    SWELLING REACTION UNSPECIFIED   Has patient had a PCN reaction causing immediate rash, facial/tongue/throat swelling, SOB or lightheadedness with hypotension: No Has patient had a PCN reaction causing severe rash involving mucus membranes or skin necrosis: No Has patient had a PCN reaction that required hospitalization No Has patient had a PCN reaction occurring within the last 10 years: No If all of the above answers are "NO", then may proceed with Cephalosporin use.  . Sulfadiazine Other (See Comments)    1% Silver Sulfadiazine cream causes burning over a large area of skin.    Antimicrobials this admission: Vanc 7/19 >> *loaded with 2g and given appropriate  dose after HD sessions  Ceftriaxone 7/19 >>7/20 Flagyl 7/19 >>7/20 Cipro x 1 7/19 Clindamycin x 1 7/19   Microbiology results: 7/19 BCx: 2/2 MRSA- R to cipro 7/19 BCID MRSA 7/21 BCx: negative x 2 (1st negative culture)  Thank you for allowing pharmacy to be a part of this patient's care. Anette Guarneri, PharmD [phone]  03/01/2017 11:19 AM

## 2017-03-01 NOTE — Consult Note (Signed)
   [facility] CM Inpatient Consult   03/01/2017  [place] [employer]ICE 05/19/1951 [id number]    [facility] Care Management follow up.  Chart reviewed. Noted Mr. Bebout is now s/p right BKA.   He has been active with [facility].  Update sent to [facility] team.   Will continue to follow.   Marthenia Rolling, MSN-Ed, RN,BSN [facility] Liaison [phone]

## 2017-03-01 NOTE — Procedures (Signed)
Patient was seen on dialysis and the procedure was supervised.  BFR 400  Via AVF BP is  126/53.   Patient appears to be tolerating treatment well  Jacob [name] A 03/01/2017

## 2017-03-01 NOTE — Progress Notes (Signed)
  Progress Note    03/01/2017 7:33 AM 1 Day Post-Op  Subjective:  Says he's having some pain  Afebrile  Vitals:   02/28/17 2233 03/01/17 0339  BP: (!) 124/51 131/84  Pulse: 62 88  Resp: 17 13  Temp: 97.7 F (36.5 C) 98.5 F (36.9 C)    Physical Exam: Incisions:  Bandage is clean and dry   CBC    Component Value Date/Time   WBC 16.0 (H) 03/01/2017 0633   RBC 3.08 (L) 03/01/2017 0633   HGB 7.9 (L) 03/01/2017 0633   HCT 25.8 (L) 03/01/2017 0633   PLT 456 (H) 03/01/2017 0633   MCV 83.8 03/01/2017 0633   MCH 25.6 (L) 03/01/2017 0633   MCHC 30.6 03/01/2017 0633   RDW 18.4 (H) 03/01/2017 0633   LYMPHSABS 0.7 02/24/2017 0449   MONOABS 1.3 (H) 02/24/2017 0449   EOSABS 0.5 02/24/2017 0449   BASOSABS 0.2 (H) 02/24/2017 0449    BMET    Component Value Date/Time   NA 131 (L) 02/28/2017 1341   K 4.5 02/28/2017 1341   CL 94 (L) 02/28/2017 1341   CO2 25 02/28/2017 1341   GLUCOSE 156 (H) 02/28/2017 1341   BUN 39 (H) 02/28/2017 1341   CREATININE 7.37 (H) 02/28/2017 1341   CALCIUM 8.1 (L) 02/28/2017 1341   GFRNONAA 7 (L) 02/28/2017 1341   GFRAA 8 (L) 02/28/2017 1341    INR    Component Value Date/Time   INR 1.30 12/30/2016 0226     Intake/Output Summary (Last 24 hours) at 03/01/17 0733 Last data filed at 03/01/17 2751  Gross per 24 hour  Intake             1970 ml  Output              400 ml  Net             1570 ml     Assessment/Plan:  66 y.o. male is s/p right below knee amputation  1 Day Post-Op  -pt bandage is clean and dry on right BKA stump-will take down dressing tomorrow -leukocytosis slightly improved -pt with 2nd BM in the bed-Let RN know and she is holding his miralax -for dialysis this morning -PT/OT   Leontine Locket, PA-C [facility] [phone] 03/01/2017 7:33 AM

## 2017-03-01 NOTE — Progress Notes (Signed)
PT Cancellation Note  Patient Details Name: JUSTYCE BABY MRN: [id number] DOB: 1951-02-22   Cancelled Treatment:    Reason Eval/Treat Not Completed: (P) Patient at procedure or test/unavailable Pt currently at hemodialysis. PT will continue to follow as able this afternoon. Thanks.  Otha Rickles B. Migdalia Dk PT, DPT Acute Rehabilitation  [phone] Pager [phone]     [place] 03/01/2017, 10:04 AM

## 2017-03-01 NOTE — Evaluation (Signed)
Occupational Therapy Evaluation Patient Details Name: Jacob [name] MRN: [id number] DOB: [birth year] Today's Date: 03/01/2017    History of Present Illness Patient is a 66 yo male admitted for Rt foot cellulitis with history of transmet. pt declining progression to BKA. PMH:  HTN, DM, anemia, ESRD on HD, anxiety, depression, CVA, NICM, arthritis   Clinical Impression   Pt admitted with above, and demonstrates the below listed deficits.  He requires mod - max A for LB ADLs and mod A +2 for anterior/posterior transfers.  He lives alone and will need SNF level rehab at discharge.  All further OT needs can be addressed at SNF.  Acute OT to sign off.     Follow Up Recommendations  SNF    Equipment Recommendations  None recommended by OT    Recommendations for Other Services       Precautions / Restrictions Precautions Precautions: Fall Restrictions Weight Bearing Restrictions: No      Mobility Bed Mobility Overal bed mobility: Needs Assistance Bed Mobility: Supine to Sit;Sit to Supine;Rolling Rolling: Mod assist   Supine to sit: Min assist Sit to supine: Mod assist;+2 for physical assistance   General bed mobility comments: modA for rolling L and R for pericare, minAx2 for trunk to upright with supine to sit and modAx2 for pad scoot up in bed and centering in bed  Transfers Overall transfer level: Needs assistance Equipment used: None Transfers: Comptroller transfers: Mod assist;+2 physical assistance   General transfer comment: modAx2 for off weighting bottom to scoot backward and forward to bedside commode    Balance Overall balance assessment: Needs assistance Sitting-balance support: Feet supported;Bilateral upper extremity supported Sitting balance-Leahy Scale: Fair                                     ADL either performed or assessed with clinical judgement   ADL Overall ADL's : Needs  assistance/impaired Eating/Feeding: Set up   Grooming: Wash/dry hands;Wash/dry face;Oral care;Set up;Bed level   Upper Body Bathing: Minimal assistance;Bed level   Lower Body Bathing: Maximal assistance;Bed level   Upper Body Dressing : Maximal assistance;Bed level   Lower Body Dressing: Total assistance;Bed level   Toilet Transfer: Moderate assistance;+2 for physical assistance;Anterior/posterior;BSC   Toileting- Water quality scientist and Hygiene: Total assistance;Bed level       Functional mobility during ADLs: Moderate assistance;+2 for physical assistance General ADL Comments: Pt incontinent loose stool      Vision         Perception     Praxis      Pertinent Vitals/Pain Pain Assessment: 0-10 Pain Score: 7  Pain Location: R LE surgical site Pain Descriptors / Indicators: Operative site guarding;Grimacing;Guarding;Sore Pain Intervention(s): Monitored during session;Patient requesting pain meds-RN notified;RN gave pain meds during session     Hand Dominance Right   Extremity/Trunk Assessment Upper Extremity Assessment Upper Extremity Assessment: Generalized weakness   Lower Extremity Assessment Lower Extremity Assessment: Generalized weakness   Cervical / Trunk Assessment Cervical / Trunk Assessment: Normal   Communication Communication Communication: No difficulties   Cognition Arousal/Alertness: Awake/alert Behavior During Therapy: Impulsive (poor problem solving skills) Overall Cognitive Status: No family/caregiver present to determine baseline cognitive functioning  General Comments  VSS     Exercises     Shoulder Instructions      Home Living Family/patient expects to be discharged to:: Skilled nursing facility   Available Help at Discharge: Family;Available PRN/intermittently                             Additional Comments: Pt lived alone PTA       Prior  Functioning/Environment Level of Independence: Independent with assistive device(s)                 OT Problem List: Decreased strength;Decreased activity tolerance;Impaired balance (sitting and/or standing);Decreased safety awareness;Decreased knowledge of use of DME or AE;Pain      OT Treatment/Interventions:      OT Goals(Current goals can be found in the care plan section) Acute Rehab OT Goals Patient Stated Goal: decrease pain  OT Goal Formulation: All assessment and education complete, DC therapy  OT Frequency:     Barriers to D/C:            Co-evaluation PT/OT/SLP Co-Evaluation/Treatment: Yes Reason for Co-Treatment: Complexity of the patient's impairments (multi-system involvement);For patient/therapist safety;To address functional/ADL transfers PT goals addressed during session: Mobility/safety with mobility OT goals addressed during session: ADL's and self-care      AM-PAC PT "6 Clicks" Daily Activity     Outcome Measure Help from another person eating meals?: A Little Help from another person taking care of personal grooming?: A Little Help from another person toileting, which includes using toliet, bedpan, or urinal?: A Lot Help from another person bathing (including washing, rinsing, drying)?: A Lot Help from another person to put on and taking off regular upper body clothing?: A Lot Help from another person to put on and taking off regular lower body clothing?: A Lot 6 Click Score: 14   End of Session Nurse Communication: Mobility status  Activity Tolerance: Patient tolerated treatment well Patient left: in bed;with call bell/phone within reach;with bed alarm set  OT Visit Diagnosis: Unsteadiness on feet (R26.81);Pain Pain - Right/Left: Right Pain - part of body: Leg;Hip                Time: 1448-1856 OT Time Calculation (min): 50 min Charges:  OT General Charges $OT Visit: 1 Procedure OT Evaluation $OT Eval Moderate Complexity: 1 Procedure OT  Treatments $Self Care/Home Management : 8-22 mins G-Codes:     [facility], OTR/L [phone]   [place], Beva Remund M 03/01/2017, 8:15 PM

## 2017-03-01 NOTE — Progress Notes (Signed)
PROGRESS NOTE  Jacob [name]  LKT:[id number] DOB: 03/31/1951 DOA: 02/18/2017 PCP: Sharilyn Sites, MD  Brief Narrative:   The patient is a 66 year old male resident of the [facility] with diabetes mellitus, ESRD, hypertension, previous CVA, peripheral arterial disease who presented with a wound infection.  He recently underwent revascularization of the right lower extremity and had a transmetatarsal amputation performed on 01/01/2017 of the right foot. He was seen at the vascular office on July 17 and was found to have poor wound healing. He came to the emergency department on 7/19 with wound dehiscence and a marked leukocytosis. Vascular surgery was consulted.  The patient underwent angiography on 7/24. Unfortunately, the vascular surgeon was unable to perform angioplasty of the right anterior tibial artery and he had no surgical targets.  Dr. Trula Slade recommended a right BKA for source control as the patient has been found to have MRSA bacteremia.  The patient repeated declined amputation for three days, but changed his mind the evening of 7/27 and demanded immediate surgery.  Only a few hours prior, the medical staff had to convince him to take dialysis.  Psychiatry met with the patient and determined that he does have capacity for medical decision-making.  The surgeons have kindly agreed to post the patient for surgery on 7/29.  If he changes his mind, social work is standing by to discharge the patient to SNF and he can follow up with vascular surgery as an outpatient.    Assessment & Plan:   Principal Problem:   Wound infection Active Problems:   End-stage renal disease on hemodialysis ([place])   Essential hypertension, benign   Insulin-requiring or dependent type II diabetes mellitus ([place])   Normocytic anemia   Hyponatremia   Right foot infection   DNR (do not resuscitate)   Palliative care by specialist  Infected right nonhealing transmetatarsal amputation wound with dehiscence, likely  the source of his MRSA bacteremia.  He was seen by vascular surgery and by palliative care.   -  underwent right BKA by Dr. Donnetta Hutching on 7/29 -  Continue vancomycin -  Palliative care to assist with pain control -  Vascular surgery to perform first dressing change on 7/31  MRSA bacteremia, likely secondary to his forefoot infection - MRI in 7/20 suggested possible discitis, however his back pain has improved and he will already be receiving a long course of vancomycin - Continue vancomycin for 4 weeks post amputation, last day on August 25th - ECHO: No evidence of endocarditis - ID consult appreciated  Toxic metabolic encephalopathy secondary to sepsis and sedating medications, resolved  End-stage renal disease, normally a Monday Wednesday Friday schedule. Hyponatremia and secondary hyperparathyroidism related. - Appreciate nephrology assistance  Acute blood loss anemia superimposed on anemia of chronic disease - Transfused 2 units of pack red blood cells on 7/23 - Receiving iron and EPO per nephrology  History of CVA, stable, continue Plavix and aspirin  Nonischemic cardiomyopathy, EF was 55-60 percent on 03/07/2015, volume control at dialysis  Hypertension with intermittent hypotension - Continue Coreg - Consider resuming lisinopril if his blood pressure remained stable during hemodialysis  Possible seizure disorder, bipolar disorder. Patient was previously on Depakote but had not taking the medication in 2 months. Currently being held.  Diabetes mellitus type 2 with peripheral vascular palpitations, CBG well controlled.  A1c 7.7 in May 2018 - Continue Lantus 15units daily at bedtime - Continue sliding scale insulin  Persistent leukocytosis secondary to MRSA bacteremia and osteomyelitis of his  foot  DVT prophylaxis:  lovenox Code Status:  DNR Family Communication:  Patient alone Disposition Plan:  SNF on 7/31 after first dressing change  Consultants:    Nephrology  Dr. Trula Slade, VVS  Infectious disease, Dr. Novella Olive  Procedures:   7/24:  Aortogram with RLE run-off and failed attempt at angioplasty of the RLE on 7/24 Aortogram: No significant renal artery stenosis. No infrarenal aortic stenosis. Bilateral iliac arteries are widely patent Right Lower Extremity: The right common femoral profunda femoral and superficial femoral artery are patent without stenosis. The popliteal artery is patent throughout it's course. There is mild luminal irregularity at the curve of the anterior tibial artery. Anterior tibial is patent down to just above the ankle where it occludes. The posterior tibial and peroneal arteries are occluded Left Lower Extremity:Not evaluated  Antimicrobials:  Anti-infectives    Start     Dose/Rate Route Frequency Ordered Stop   02/26/17 1449  vancomycin (VANCOCIN) 1-5 GM/200ML-% IVPB    Comments:  [name]   : cabinet override      02/26/17 1449 02/26/17 1532   02/26/17 0000  vancomycin (VANCOCIN) 1-5 GM/200ML-% SOLN     1,000 mg 200 mL/hr over 60 Minutes Intravenous Every M-W-F (Hemodialysis) 02/26/17 1304     02/22/17 0928  vancomycin (VANCOCIN) 1-5 GM/200ML-% IVPB    Comments:  [name]   : cabinet override      02/22/17 0928 02/22/17 1119   02/19/17 1200  vancomycin (VANCOCIN) IVPB 1000 mg/200 mL premix     1,000 mg 200 mL/hr over 60 Minutes Intravenous Every M-W-F (Hemodialysis) 02/18/17 2109     02/18/17 2200  cefTRIAXone (ROCEPHIN) 2 g in dextrose 5 % 50 mL IVPB  Status:  Discontinued     2 g 100 mL/hr over 30 Minutes Intravenous Every 24 hours 02/18/17 2158 02/19/17 1854   02/18/17 2200  metroNIDAZOLE (FLAGYL) IVPB 500 mg  Status:  Discontinued     500 mg 100 mL/hr over 60 Minutes Intravenous Every 8 hours 02/18/17 2158 02/19/17 1854   02/18/17 2115  clindamycin (CLEOCIN) IVPB 600 mg     600 mg 100 mL/hr over 30 Minutes Intravenous  Once 02/18/17 2101 02/18/17 2228    02/18/17 2115  ciprofloxacin (CIPRO) IVPB 400 mg     400 mg 200 mL/hr over 60 Minutes Intravenous  Once 02/18/17 2101 02/18/17 2327   02/18/17 2115  vancomycin (VANCOCIN) 2,000 mg in sodium chloride 0.9 % 500 mL IVPB     2,000 mg 250 mL/hr over 120 Minutes Intravenous  Once 02/18/17 2104 02/19/17 0149       Subjective:  Feeling much better, his pain is much better controlled today. Denies dyspnea, chest pains. He is eating well and his bowels have been moving frequently.  Objective: Vitals:   03/01/17 0905 03/01/17 0930 03/01/17 1000 03/01/17 1030  BP: 125/61 (!) 126/53 (!) 137/56 123/61  Pulse: 63 61 63 67  Resp:      Temp:      TempSrc:      SpO2:      Weight:      Height:        Intake/Output Summary (Last 24 hours) at 03/01/17 1103 Last data filed at 03/01/17 5993  Gross per 24 hour  Intake              720 ml  Output                0 ml  Net  720 ml   Filed Weights   02/28/17 0451 03/01/17 0500 03/01/17 0845  Weight: 122.3 kg (269 lb 10 oz) 119.6 kg (263 lb 10.7 oz) 119.3 kg (263 lb 0.1 oz)    Examination:  General exam:  Adult male, no acute distress, relaxed, Seen in HD HEENT:  Normocephalic atraumatic, moist because membranes Respiratory system: Clear to auscultation bilaterally  Cardiovascular system:  Regular rate and rhythm, no murmurs rubs or gallops, warm extremities Gastrointestinal system:  NABS, soft, nondistended, nontender MSK:  Right stump wrapped in Kerlix, minimal swelling, no blood or discoloration of his dressing.  Bandage not removed for exam today.   Neuro:  Grossly moves all extremities    Data Reviewed: I have personally reviewed following labs and imaging studies  CBC:  Recent Labs Lab 02/23/17 0346 02/24/17 0002 02/24/17 0449 02/26/17 1258 02/28/17 0723 02/28/17 1346 03/01/17 0633  WBC 19.8* 16.5* 17.9* 16.5*  --  17.8* 16.0*  NEUTROABS 15.5*  --  15.2*  --   --   --   --   HGB 8.7* 8.7* 8.8* 8.4* 10.9* 7.8*  7.9*  HCT 27.8* 27.9* 28.5* 27.6* 32.0* 26.1* 25.8*  MCV 81.8 82.3 82.1 82.6  --  83.1 83.8  PLT 508* 482* 535* 581*  --  510* 875*   Basic Metabolic Panel:  Recent Labs Lab 02/24/17 0002 02/26/17 1258 02/28/17 0723 02/28/17 1341 03/01/17 0633  NA 128* 131* 134* 131* 128*  K 3.9 4.5 4.9 4.5 5.3*  CL 92* 94*  --  94* 93*  CO2 25 26  --  25 25  GLUCOSE 149* 146* 178* 156* 122*  BUN 38* 38*  --  39* 48*  CREATININE 7.54* 7.98*  --  7.37* 8.39*  CALCIUM 8.1* 8.2*  --  8.1* 8.0*  PHOS 3.5 3.3  --  3.6  --    GFR: Estimated Creatinine Clearance: 11.7 mL/min (A) (by C-G formula based on SCr of 8.39 mg/dL (H)). Liver Function Tests:  Recent Labs Lab 02/24/17 0002 02/26/17 1258 02/28/17 1341  ALBUMIN 1.9* 1.8* 2.0*   No results for input(s): LIPASE, AMYLASE in the last 168 hours. No results for input(s): AMMONIA in the last 168 hours. Coagulation Profile: No results for input(s): INR, PROTIME in the last 168 hours. Cardiac Enzymes: No results for input(s): CKTOTAL, CKMB, CKMBINDEX, TROPONINI in the last 168 hours. BNP (last 3 results) No results for input(s): PROBNP in the last 8760 hours. HbA1C: No results for input(s): HGBA1C in the last 72 hours. CBG:  Recent Labs Lab 02/28/17 1001 02/28/17 1214 02/28/17 1659 02/28/17 2235 03/01/17 0608  GLUCAP 141* 164* 131* 113* 144*   Lipid Profile: No results for input(s): CHOL, HDL, LDLCALC, TRIG, CHOLHDL, LDLDIRECT in the last 72 hours. Thyroid Function Tests: No results for input(s): TSH, T4TOTAL, FREET4, T3FREE, THYROIDAB in the last 72 hours. Anemia Panel: No results for input(s): VITAMINB12, FOLATE, FERRITIN, TIBC, IRON, RETICCTPCT in the last 72 hours. Urine analysis: No results found for: COLORURINE, APPEARANCEUR, [place], [place], GLUCOSEU, HGBUR, BILIRUBINUR, KETONESUR, PROTEINUR, UROBILINOGEN, NITRITE, LEUKOCYTESUR Sepsis Labs: _0 (procalcitonin:4,lacticidven:4)  ) Recent Results (from the past 240  hour(s))  Culture, blood (routine x 2)     Status: None   Collection Time: 02/20/17  5:20 AM  Result Value Ref Range Status   Specimen Description BLOOD LEFT ANTECUBITAL  Final   Special Requests   Final    BOTTLES DRAWN AEROBIC AND ANAEROBIC Blood Culture adequate volume   Culture NO GROWTH 5 DAYS  Final  Report Status 02/25/2017 FINAL  Final  Culture, blood (routine x 2)     Status: None   Collection Time: 02/20/17  5:21 AM  Result Value Ref Range Status   Specimen Description BLOOD LEFT HAND  Final   Special Requests   Final    BOTTLES DRAWN AEROBIC ONLY Blood Culture adequate volume   Culture NO GROWTH 5 DAYS  Final   Report Status 02/25/2017 FINAL  Final      Radiology Studies: No results found.   Scheduled Meds: . aspirin EC  325 mg Oral Daily  . atorvastatin  40 mg Oral q1800  . carvedilol  12.5 mg Oral 2 times per day on Sun Tue Thu Sat  . cinacalcet  30 mg Oral Q breakfast  . clopidogrel  75 mg Oral Daily  . [START ON 03/05/2017] darbepoetin (ARANESP) injection - DIALYSIS  100 mcg Intravenous Q Fri-HD  . docusate sodium  100 mg Oral Daily  . doxercalciferol  1.5 mcg Intravenous Q M,W,F-HD  . feeding supplement (NEPRO CARB STEADY)  237 mL Oral Q1500  . feeding supplement (PRO-STAT SUGAR FREE 64)  30 mL Oral Daily  . finasteride  5 mg Oral Daily  . heparin  5,000 Units Subcutaneous Q8H  . insulin aspart  0-5 Units Subcutaneous QHS  . insulin aspart  0-9 Units Subcutaneous TID WC  . insulin glargine  15 Units Subcutaneous QHS  . multivitamin  1 tablet Oral Q M,W,F-2000  . pantoprazole  40 mg Oral Daily  . polyethylene glycol  34 g Oral BID  . senna  2 tablet Oral QHS  . sevelamer carbonate  2,400 mg Oral TID WC  . sodium chloride flush  3 mL Intravenous Q12H  . triamcinolone 0.1 % cream : eucerin   Topical TID   Continuous Infusions: . sodium chloride 0 mL/hr at 02/28/17 0830  . sodium chloride Stopped (02/23/17 2300)  . sodium chloride    . sodium chloride      . magnesium sulfate 1 - 4 g bolus IVPB    . vancomycin 1,000 mg (02/26/17 1531)     LOS: 11 days    Time spent: 30 min    Janece Canterbury, MD [facility] Pager [phone]  If 7PM-7AM, please contact night-coverage [email or web address] Password [email or web address] 03/01/2017, 11:03 AM

## 2017-03-01 NOTE — Progress Notes (Signed)
I met with pt at bedside. He states he wants to go to SNF in [place] for his rehab. I will alert SW. RN CM made aware. We will sign off at this time. [phone]

## 2017-03-02 DIAGNOSIS — B9562 Methicillin resistant Staphylococcus aureus infection as the cause of diseases classified elsewhere: Secondary | ICD-10-CM | POA: Diagnosis present

## 2017-03-02 DIAGNOSIS — N186 End stage renal disease: Secondary | ICD-10-CM | POA: Diagnosis not present

## 2017-03-02 DIAGNOSIS — R7881 Bacteremia: Secondary | ICD-10-CM | POA: Diagnosis present

## 2017-03-02 DIAGNOSIS — Z992 Dependence on renal dialysis: Secondary | ICD-10-CM | POA: Diagnosis not present

## 2017-03-02 LAB — CBC
HCT: 25 % — ABNORMAL LOW (ref 39.0–52.0)
HEMOGLOBIN: 7.4 g/dL — AB (ref 13.0–17.0)
MCH: 24.8 pg — AB (ref 26.0–34.0)
MCHC: 29.6 g/dL — AB (ref 30.0–36.0)
MCV: 83.9 fL (ref 78.0–100.0)
PLATELETS: 536 10*3/uL — AB (ref 150–400)
RBC: 2.98 MIL/uL — ABNORMAL LOW (ref 4.22–5.81)
RDW: 17.8 % — AB (ref 11.5–15.5)
WBC: 14.2 10*3/uL — ABNORMAL HIGH (ref 4.0–10.5)

## 2017-03-02 LAB — GLUCOSE, CAPILLARY
GLUCOSE-CAPILLARY: 121 mg/dL — AB (ref 65–99)
GLUCOSE-CAPILLARY: 112 mg/dL — AB (ref 65–99)
Glucose-Capillary: 123 mg/dL — ABNORMAL HIGH (ref 65–99)

## 2017-03-02 LAB — BASIC METABOLIC PANEL
ANION GAP: 8 (ref 5–15)
BUN: 29 mg/dL — ABNORMAL HIGH (ref 6–20)
CALCIUM: 7.8 mg/dL — AB (ref 8.9–10.3)
CO2: 27 mmol/L (ref 22–32)
CREATININE: 6.25 mg/dL — AB (ref 0.61–1.24)
Chloride: 95 mmol/L — ABNORMAL LOW (ref 101–111)
GFR calc Af Amer: 10 mL/min — ABNORMAL LOW (ref >60)
GFR, EST NON AFRICAN AMERICAN: 8 mL/min — AB (ref >60)
GLUCOSE: 134 mg/dL — AB (ref 65–99)
Potassium: 4.6 mmol/L (ref 3.5–5.1)
Sodium: 130 mmol/L — ABNORMAL LOW (ref 135–145)

## 2017-03-02 MED ORDER — ASPIRIN EC 81 MG PO TBEC: 81.0000 mg | DELAYED_RELEASE_TABLET | Freq: Every day | ORAL | 0 refills | Status: DC

## 2017-03-02 MED ORDER — LANTUS SOLOSTAR 100 UNIT/ML ~~LOC~~ SOPN: 15.0000 [IU] | PEN_INJECTOR | Freq: Every day | SUBCUTANEOUS | 0 refills | Status: AC

## 2017-03-02 MED ORDER — CARVEDILOL 12.5 MG PO TABS: 12.5000 mg | ORAL_TABLET | Freq: Two times a day (BID) | ORAL | 0 refills | Status: DC

## 2017-03-02 NOTE — Discharge Summary (Addendum)
Physician Discharge Summary  Jacob [name] ZOX:[id number] DOB: 01/01/1951 DOA: 02/18/2017  PCP: Sharilyn Sites, MD  Admit date: 02/18/2017 Discharge date: 03/02/2017  Admitted From: home  Disposition:  SNF  Recommendations for Outpatient Follow-up:  1. Hemodialysis on Monday, Wednesday, Friday schedule 2. Continue vancomycin for 4 weeks, last day on August 25 3. Follow up with vascular surgery in 2 weeks 4. Follow up with ID in 4 weeks for MRSA bacteremia  Home Health:  PT/OT  Equipment/Devices:  Wet to dry dressings of the right foot  Discharge Condition:  Stable, improved CODE STATUS:  DNR  Diet recommendation:  Renal/dialysis   Brief/Interim Summary:  The patient is a 66 year old male resident of the [facility] with diabetes mellitus, ESRD, hypertension, previous CVA, peripheral arterial disease who presented with a wound infection. He recently underwent revascularization of the right lower extremity and had a transmetatarsal amputation performed on 01/01/2017 of the right foot. He was seen at the vascular office on July 17 and was found to have poor wound healing. He came to the emergency department on 7/19 with wound dehiscence and a marked leukocytosis.  Vascular surgery was consulted. The patient underwent angiography on 7/24. Unfortunately, the vascular surgeon was unable to perform angioplasty of the right anterior tibial artery and he had no surgical targets. He was found to have MRSA bacteremia, likely due to his infected foot.  Dr. Trula Slade recommended a right BKA for source control, however, the patient initially declined surgery.  He later changed his mind and underwent BKA on 7/29 by Dr. Donnetta Hutching.  He will need 4 weeks of vancomycin at hemodialysis, last day on August 25th.  Discharge Diagnoses:  Principal Problem:   MRSA bacteremia Active Problems:   End-stage renal disease on hemodialysis ([place])   Essential hypertension, benign   Insulin-requiring or dependent type II  diabetes mellitus ([place])   Normocytic anemia   Wound infection   Hyponatremia   Right foot infection   DNR (do not resuscitate)   Palliative care by specialist   PAD (peripheral artery disease) ([place])   Infection   Post-operative pain   Acute blood loss anemia   Anemia of chronic disease   Leukocytosis  Infected right nonhealing transmetatarsal amputation wound with dehiscence, likely the source of his MRSA bacteremia. He was seen by vascular surgery and by palliative care.  - underwent right BKA by Dr. Donnetta Hutching on 7/29 - Continue vancomycin - Palliative care assisted with pain control -  Vascular surgery performed first dressing change on 7/31  MRSA bacteremia, likely secondary to his forefoot infection - MRI in 7/20 suggested possible discitis, however his back pain has improved and he will already be receiving a long course of vancomycin - Continue vancomycin for 4 weeks post amputation, last day on August 25th - ECHO: No evidence of endocarditis - ID consult appreciated  Toxic metabolic encephalopathy secondary to sepsis and sedating medications, resolved  End-stage renal disease, normally a Monday Wednesday Friday schedule. Hyponatremia and secondary hyperparathyroidism related. - Appreciate nephrology assistance  Acute blood loss anemia superimposed on anemia of chronic disease - Transfused 2 units of pack red blood cells on 7/23 - Receiving iron and EPO per nephrology  History of CVA, stable, continue Plavix and aspirin  Nonischemic cardiomyopathy, EF was 55-60 percent on 03/07/2015, volume control at dialysis  Hypertension, blood pressures wnl - Continued Coreg - Stopped lisinopril  Possible seizure disorder, bipolar disorder.  Patient was previously on Depakote but had not taking the medication in  2 months. Currently being held.  Defer resumption to his PCP  Diabetes mellitus type 2 with peripheral vascular palpitations, CBG well controlled.  A1c 7.7 in May 2018 - Continue Lantus 15units daily at bedtime - Continue sliding scale insulin  Persistent leukocytosis secondary to MRSA bacteremia and osteomyelitis of his foot has started trending down post amputation  Discharge Instructions     Medication List    STOP taking these medications   divalproex 500 MG DR tablet Commonly known as:  DEPAKOTE   lisinopril 20 MG tablet Commonly known as:  PRINIVIL,ZESTRIL   loratadine 10 MG tablet Commonly known as:  CLARITIN   oxyCODONE-acetaminophen 5-325 MG tablet Commonly known as:  PERCOCET/ROXICET   ramipril 10 MG capsule Commonly known as:  ALTACE   ROBITUSSIN 12 HOUR COUGH 30 MG/5ML liquid Generic drug:  dextromethorphan     TAKE these medications   acetaminophen 500 MG tablet Commonly known as:  TYLENOL Take 1,000 mg by mouth every 6 (six) hours as needed for mild pain.   aspirin EC 81 MG tablet Take 1 tablet (81 mg total) by mouth daily. What changed:  medication strength  how much to take   atorvastatin 40 MG tablet Commonly known as:  LIPITOR Take 1 tablet (40 mg total) by mouth daily at 6 PM. What changed:  when to take this   bisacodyl 5 MG EC tablet Commonly known as:  DULCOLAX Take 2 tablets (10 mg total) by mouth daily as needed for moderate constipation.   carvedilol 12.5 MG tablet Commonly known as:  COREG Take 1 tablet (12.5 mg total) by mouth 2 (two) times daily with a meal. 2 times per day on Sun, Tu, Thur, and Sat.  Hold on dialysis days.   cinacalcet 30 MG tablet Commonly known as:  SENSIPAR Take 30 mg by mouth daily.   clopidogrel 75 MG tablet Commonly known as:  PLAVIX Take 1 tablet (75 mg total) by mouth daily with breakfast.   DIALYVITE TABLET Tabs Take 1 tablet by mouth every Monday, Wednesday, and Friday.   EPIPEN 2-PAK 0.3 mg/0.3 mL Soaj injection Generic drug:  EPINEPHrine Inject 0.3 mg into the muscle once as needed (For anaphylaxis.).   feeding supplement  (NEPRO CARB STEADY) Liqd Take 237 mLs by mouth daily at 3 pm.   finasteride 5 MG tablet Commonly known as:  PROSCAR Take 1 tablet (5 mg total) by mouth daily.   HYDROcodone-acetaminophen 5-325 MG tablet Commonly known as:  NORCO/VICODIN Take 1 tablet by mouth every 4 (four) hours as needed for moderate pain.   LANTUS SOLOSTAR 100 UNIT/ML Solostar Pen Generic drug:  Insulin Glargine Inject 15 Units into the skin at bedtime. What changed:  how much to take  additional instructions   omeprazole 40 MG capsule Commonly known as:  PRILOSEC Take 40 mg by mouth daily.   polyethylene glycol powder powder Commonly known as:  GLYCOLAX/MIRALAX Take 17 g by mouth daily.   RENVELA 800 MG tablet Generic drug:  sevelamer carbonate Take 800-2,400 mg by mouth 3 (three) times daily with meals. Take 2400 mg by mouth 3 times daily with meals and 800 mg by mouth with snacks   senna 8.6 MG Tabs tablet Commonly known as:  SENOKOT Take 2 tablets (17.2 mg total) by mouth at bedtime.   vancomycin 1-5 GM/200ML-% Soln Commonly known as:  VANCOCIN Inject 200 mLs (1,000 mg total) into the vein every Monday, Wednesday, and Friday with hemodialysis.      Follow-up Information  Sharilyn Sites, MD. Daphane Shepherd on 03/04/2017.   Specialty:  Family Medicine Why:  9:45 AM AUGUST 2 Contact information: [address] [place] [address] [phone]        [name], MD. Schedule an appointment as soon as possible for a visit in 2 week(s).   Specialties:  Vascular Surgery, Cardiology Why:  PLEASE CALL TO CONFIRM Contact information: [place] [address] [phone]        [facility]             . Schedule an appointment as soon as possible for a visit in 4 week(s).   Contact information: [place] [phone]         Allergies  Allergen Reactions  . Bee Venom Anaphylaxis  . Iodine Swelling  .  Penicillins Swelling and Other (See Comments)    SWELLING REACTION UNSPECIFIED   Has patient had a PCN reaction causing immediate rash, facial/tongue/throat swelling, SOB or lightheadedness with hypotension: No Has patient had a PCN reaction causing severe rash involving mucus membranes or skin necrosis: No Has patient had a PCN reaction that required hospitalization No Has patient had a PCN reaction occurring within the last 10 years: No If all of the above answers are "NO", then may proceed with Cephalosporin use.  . Sulfadiazine Other (See Comments)    1% Silver Sulfadiazine cream causes burning over a large area of skin.    Consultations:  Nephrology  Dr. Trula Slade, VVS  Infectious disease, Dr. Linus Salmons  Palliative care   Procedures/Studies: Mr Lumbar Spine Wo Contrast  Result Date: 02/19/2017 CLINICAL DATA:  Right leg pain. Post transmetatarsal amputation right foot 01/01/2017 with wound dehiscence. Leukocytosis. ESRD. EXAM: MRI LUMBAR SPINE WITHOUT CONTRAST TECHNIQUE: Multiplanar, multisequence MR imaging of the lumbar spine was performed. No intravenous contrast was administered. COMPARISON:  Lumbar MRI 04/25/2016, 12/11/2010 FINDINGS: Image quality degraded by artifact from technical factors. Segmentation:  Normal Alignment:  Mild anterolisthesis L4-5 Vertebrae: Negative for fracture or mass. Advanced disc degeneration fatty endplate changes and spurring L5-S1 as noted on the prior study. Bone marrow is diffusely low signal on T2 likely related to chronic anemia from dialysis. Conus medullaris: Conus medullaris not well seen. There is some artifact over the spinal canal in this area. The conus appears to terminate at L1-2. Paraspinal and other soft tissues: Mild diffuse edema in the paraspinous muscles bilaterally. No psoas abscess. Disc levels: L1-2:  Negative L2-3: Small subdural 8 fusion anterior and posterior to the thecal sac. Mild disc and facet degeneration with mild spinal  stenosis L3-4: Mild disc bulging and facet degeneration with moderate spinal stenosis. L4-5: Abnormal signal in the ventral and posterior epidural space with dirty epidural fat and poorly defined small posterior fluid collection. This extends from approximately mid L4 through mid L5. There was a similar process in the posterior epidural space on the prior study. This could represent a chronic infection. The right L4-5 facet joint has an infusion and is arthritic and could be a source of infection. L5-S1: Advanced disc degeneration and spondylosis causing foraminal stenosis bilaterally. Posterior epidural process on the right at S1 is new since the prior study and could represent infection IMPRESSION: Abnormal epidural fat in the lumbar spine. Dirty fat with areas of ill-defined fluid or edema are present in the epidural space which could represent a chronic epidural infection. Possible fungal or atypical infection in this patient on dialysis. Abnormal posterior epidural  process was present on MRI of 04/25/2016 with some progression since that time and development of a new area in the posterior epidural space on the right at L1. Correlate with blood cultures. The source could be the right L4-5 facet joint. Electronically Signed   By: Franchot Gallo M.D.   On: 02/19/2017 15:36     7/24:  Aortogram with RLE run-off and failed attempt at angioplasty of the RLE on 7/24 Aortogram: No significant renal artery stenosis. No infrarenal aortic stenosis. Bilateral iliac arteries are widely patent Right Lower Extremity: The right common femoral profunda femoral and superficial femoral artery are patent without stenosis. The popliteal artery is patent throughout it's course. There is mild luminal irregularity at the curve of the anterior tibial artery. Anterior tibial is patent down to just above the ankle where it occludes. The posterior tibial and peroneal arteries are occluded Left  Lower Extremity:Not evaluated   Subjective:  He is feeling much better after surgery.  His pain is much better controlled.  Denies chest pain, SOB, nausea.  EAting well and pooping regularly so his belly feels less bloated.    Discharge Exam: Vitals:   03/02/17 0838 03/02/17 1001  BP: (!) 125/52 119/89  Pulse: 70 97  Resp: 19   Temp: 98.3 F (36.8 C)    Vitals:   03/01/17 2030 03/02/17 0425 03/02/17 0838 03/02/17 1001  BP: (!) 108/44 (!) 122/46 (!) 125/52 119/89  Pulse: 67 68 70 97  Resp: 20 18 19    Temp: 98 F (36.7 C) 98.3 F (36.8 C) 98.3 F (36.8 C)   TempSrc: Oral Oral Oral   SpO2: 98% 100% 99%   Weight:  117 kg (258 lb)    Height:        General:  Adult male, no acute distress  Cardiovascular: Regular rate and rhythm, no murmurs, rubs, or gallops  Respiratory: Clear to auscultation bilaterally  Abdominal: Soft, nondistended, nontender, normal bowel sounds Extremities:  Trace edema of the right lower extremity, bandage was not removed during my visit today and will be removed by the vascular surgeons shortly.  Neuro: Grossly moves all extremities  The results of significant diagnostics from this hospitalization (including imaging, microbiology, ancillary and laboratory) are listed below for reference.     Microbiology: No results found for this or any previous visit (from the past 240 hour(s)).   Labs: BNP (last 3 results) No results for input(s): BNP in the last 8760 hours. Basic Metabolic Panel:  Recent Labs Lab 02/24/17 0002 02/26/17 1258 02/28/17 0723 02/28/17 1341 03/01/17 0633 03/02/17 0324  NA 128* 131* 134* 131* 128* 130*  K 3.9 4.5 4.9 4.5 5.3* 4.6  CL 92* 94*  --  94* 93* 95*  CO2 25 26  --  25 25 27   GLUCOSE 149* 146* 178* 156* 122* 134*  BUN 38* 38*  --  39* 48* 29*  CREATININE 7.54* 7.98*  --  7.37* 8.39* 6.25*  CALCIUM 8.1* 8.2*  --  8.1* 8.0* 7.8*  PHOS 3.5 3.3  --  3.6  --   --    Liver Function Tests:  Recent Labs Lab  02/24/17 0002 02/26/17 1258 02/28/17 1341  ALBUMIN 1.9* 1.8* 2.0*   No results for input(s): LIPASE, AMYLASE in the last 168 hours. No results for input(s): AMMONIA in the last 168 hours. CBC:  Recent Labs Lab 02/24/17 0449 02/26/17 1258 02/28/17 0723 02/28/17 1346 03/01/17 0633 03/02/17 0324  WBC 17.9* 16.5*  --  17.8* 16.0*  14.2*  NEUTROABS 15.2*  --   --   --   --   --   HGB 8.8* 8.4* 10.9* 7.8* 7.9* 7.4*  HCT 28.5* 27.6* 32.0* 26.1* 25.8* 25.0*  MCV 82.1 82.6  --  83.1 83.8 83.9  PLT 535* 581*  --  510* 456* 536*   Cardiac Enzymes: No results for input(s): CKTOTAL, CKMB, CKMBINDEX, TROPONINI in the last 168 hours. BNP: Invalid input(s): POCBNP CBG:  Recent Labs Lab 03/01/17 0608 03/01/17 1633 03/01/17 2026 03/02/17 0559 03/02/17 1124  GLUCAP 144* 137* 120* 121* 112*   D-Dimer No results for input(s): DDIMER in the last 72 hours. Hgb A1c No results for input(s): HGBA1C in the last 72 hours. Lipid Profile No results for input(s): CHOL, HDL, LDLCALC, TRIG, CHOLHDL, LDLDIRECT in the last 72 hours. Thyroid function studies No results for input(s): TSH, T4TOTAL, T3FREE, THYROIDAB in the last 72 hours.  Invalid input(s): FREET3 Anemia work up No results for input(s): VITAMINB12, FOLATE, FERRITIN, TIBC, IRON, RETICCTPCT in the last 72 hours. Urinalysis No results found for: COLORURINE, APPEARANCEUR, LABSPEC, [place], GLUCOSEU, HGBUR, BILIRUBINUR, KETONESUR, PROTEINUR, UROBILINOGEN, NITRITE, LEUKOCYTESUR Sepsis Labs Invalid input(s): PROCALCITONIN,  WBC,  LACTICIDVEN   Time coordinating discharge: Over 30 minutes  SIGNED:   Janece Canterbury, MD  [facility] 03/02/2017, 2:58 PM Pager   If 7PM-7AM, please contact night-coverage [email or web address] Password [email or web address]

## 2017-03-02 NOTE — Care Management Note (Signed)
Case Management Note Original Note Created Zenon Mayo, RN 02/23/2017, 3:20 PM  Patient Details  Name: Jacob [name] MRN: [id number] Date of Birth: [birth year]  Subjective/Objective:   From home alone, he lives in [place] close to his sister, [name], [place], who is his support system, s/p  Failed attempted angioplasty, right anterior tibial artery.  His sister, [name] states he will have amputation done on Friday to right LE.   He has had HH but can not remember the agency's name. He has a cane, walker and a 3 n 1 at home.                   Action/Plan: NCM will follow for dc needs.   Expected Discharge Date:  03/02/17               Expected Discharge Plan:  Skilled Nursing Facility  In-House Referral:  Clinical Social Work  Discharge planning Services  CM Consult  Post Acute Care Choice:  NA Choice offered to:  NA  DME Arranged:    DME Agency:     HH Arranged:    [place] Agency:     Status of Service:  Completed, signed off  If discussed at [employer] of Stay Meetings, dates discussed:    Discharge Disposition: skilled facility  Additional Comments:  03/02/17- 1630 - Ahmeer Tuman RN, CM- noted d/c summary completed - pt ready for d/c to SNF today- spoke with CSW who was not aware of pt readiness for today- due to late hour- pt will need to stay tonight and get HD here in AM then d/c to SNF tomorrow after HD. CSW to let MD know.   03/01/17- 1000- Tsugio Elison RN, CM- pt s/p BKA on 7/29- CSW continues to follow for SNF placement when medically stable.    02/26/17- 1430- Gaige Fussner RN, CM- pt for d/c to SNF today- CSW following for placement needs.  Update- 1600- pt has now decided that he wants BKA-MD to speak with pt ?psych eval - and will have vascular see if they can do BKA this weekend.   02/25/17- 1630- Aviv Rota RN, CM- pt with further options for revascularization - per vascular his TMA is not healing and pt needs BKA- however pt is declining any  further procedures- OR time has been canceled- PC consulted and has met with pt for GOC. - Plan for pt will be SNF- CSW consulted- pt is HD M/W/F -[facility] - CSW following for placement needs with need for IV abx until 8/31 with HD.   Dahlia Client Poyen, RN 03/02/2017, 4:32 PM [phone]

## 2017-03-02 NOTE — Progress Notes (Signed)
Subjective:  No events overnight - he thinks he may be going to SNF today ? Objective Vital signs in last 24 hours: Vitals:   03/01/17 1300 03/01/17 2030 03/02/17 0425 03/02/17 0838  BP: (!) 123/59 (!) 108/44 (!) 122/46 (!) 125/52  Pulse: 72 67 68 70  Resp: 18 20 18 19   Temp: 97.8 F (36.6 C) 98 F (36.7 C) 98.3 F (36.8 C) 98.3 F (36.8 C)  TempSrc: Oral Oral Oral Oral  SpO2: 100% 98% 100% 99%  Weight: 115.9 kg (255 lb 8.2 oz)  117 kg (258 lb)   Height:       Weight change: -0.3 kg (-10.6 oz)  Intake/Output Summary (Last 24 hours) at 03/02/17 0844 Last data filed at 03/01/17 1445  Gross per 24 hour  Intake              240 ml  Output             3000 ml  Net            -2760 ml    Assessment/ Plan: Pt is a 66 y.o. yo male with ESRD who was admitted on 02/18/2017 with gangrene- s/p right BKA on 7/29  Assessment/Plan: 1. S/p right BKA- per VVS and rehab- POD #2 2. ESRD - normally MWF- [facility] - yest via AVF- continue on schedule   3. Anemia- hgb in the 7's- post op and CKD- on relatively low dose aranesp- inc and transfuse as needed  4. Secondary hyperparathyroidism- continue home sensipar , hectorol and renvela- phos, calc  good  5. HTN/volume- seems OK- on oral coreg  6. Dispo- if he goes to SNF in rehab, make sure can provide transportation to his HD unit.  Will have orders for HD here in the AM if he stays  Dillin Lofgren A    Labs: Basic Metabolic Panel:  Recent Labs Lab 02/24/17 0002 02/26/17 1258  02/28/17 1341 03/01/17 0633 03/02/17 0324  NA 128* 131*  < > 131* 128* 130*  K 3.9 4.5  < > 4.5 5.3* 4.6  CL 92* 94*  --  94* 93* 95*  CO2 25 26  --  25 25 27   GLUCOSE 149* 146*  < > 156* 122* 134*  BUN 38* 38*  --  39* 48* 29*  CREATININE 7.54* 7.98*  --  7.37* 8.39* 6.25*  CALCIUM 8.1* 8.2*  --  8.1* 8.0* 7.8*  PHOS 3.5 3.3  --  3.6  --   --   < > = values in this interval not displayed. Liver Function Tests:  Recent Labs Lab  02/24/17 0002 02/26/17 1258 02/28/17 1341  ALBUMIN 1.9* 1.8* 2.0*   No results for input(s): LIPASE, AMYLASE in the last 168 hours. No results for input(s): AMMONIA in the last 168 hours. CBC:  Recent Labs Lab 02/24/17 0449 02/26/17 1258  02/28/17 1346 03/01/17 0633 03/02/17 0324  WBC 17.9* 16.5*  --  17.8* 16.0* 14.2*  NEUTROABS 15.2*  --   --   --   --   --   HGB 8.8* 8.4*  < > 7.8* 7.9* 7.4*  HCT 28.5* 27.6*  < > 26.1* 25.8* 25.0*  MCV 82.1 82.6  --  83.1 83.8 83.9  PLT 535* 581*  --  510* 456* 536*  < > = values in this interval not displayed. Cardiac Enzymes: No results for input(s): CKTOTAL, CKMB, CKMBINDEX, TROPONINI in the last 168 hours. CBG:  Recent Labs Lab 02/28/17 2235 03/01/17 3151  03/01/17 1633 03/01/17 2026 03/02/17 0559  GLUCAP 113* 144* 137* 120* 121*    Iron Studies: No results for input(s): IRON, TIBC, TRANSFERRIN, FERRITIN in the last 72 hours. Studies/Results: No results found. Medications: Infusions: . sodium chloride 0 mL/hr at 02/28/17 0830  . sodium chloride Stopped (02/23/17 2300)  . magnesium sulfate 1 - 4 g bolus IVPB    . vancomycin Stopped (03/01/17 1305)    Scheduled Medications: . aspirin EC  325 mg Oral Daily  . atorvastatin  40 mg Oral q1800  . carvedilol  12.5 mg Oral 2 times per day on Sun Tue Thu Sat  . cinacalcet  30 mg Oral Q breakfast  . clopidogrel  75 mg Oral Daily  . [START ON 03/05/2017] darbepoetin (ARANESP) injection - DIALYSIS  100 mcg Intravenous Q Fri-HD  . doxercalciferol  1.5 mcg Intravenous Q M,W,F-HD  . feeding supplement (NEPRO CARB STEADY)  237 mL Oral Q1500  . feeding supplement (PRO-STAT SUGAR FREE 64)  30 mL Oral Daily  . finasteride  5 mg Oral Daily  . heparin  5,000 Units Subcutaneous Q8H  . insulin aspart  0-5 Units Subcutaneous QHS  . insulin aspart  0-9 Units Subcutaneous TID WC  . insulin glargine  15 Units Subcutaneous QHS  . multivitamin  1 tablet Oral Q M,W,F-2000  . pantoprazole  40 mg  Oral Daily  . sevelamer carbonate  2,400 mg Oral TID WC  . sodium chloride flush  3 mL Intravenous Q12H  . triamcinolone 0.1 % cream : eucerin   Topical TID    have reviewed scheduled and prn medications.  Physical Exam: General: NAD Heart: RRR Lungs: mostly clear Abdomen: obese, soft Extremities: minimal edema  Dialysis Access: right AVF- thrill and bruit   03/02/2017,8:44 AM  LOS: 12 days

## 2017-03-02 NOTE — Progress Notes (Addendum)
[facility] [place] [facility]  Subjective  -  Doing well over all.   Objective (!) 122/46 68 98.3 F (36.8 C) (Oral) 18 100%  Intake/Output Summary (Last 24 hours) at 03/02/17 0750 Last data filed at 03/01/17 1445  Gross per 24 hour  Intake              480 ml  Output             3000 ml  Net            -2520 ml    Right BKA dressing clean and dry Sitting up in bed eating breakfast Bed mobility independent  Assessment/Planning: POD # 2 right BKA  Requested ace wraps for dressing change Will change dressing later today Leukocytosis, Afebrile, WBC decreased to 14.2 from 17.8. On Vanco IV ESRD on HD  Theda Sers [facility] 03/02/2017 7:50 AM --  Laboratory Lab Results:  Recent Labs  03/01/17 0633 03/02/17 0324  WBC 16.0* 14.2*  HGB 7.9* 7.4*  HCT 25.8* 25.0*  PLT 456* 536*   BMET  Recent Labs  03/01/17 0633 03/02/17 0324  NA 128* 130*  K 5.3* 4.6  CL 93* 95*  CO2 25 27  GLUCOSE 122* 134*  BUN 48* 29*  CREATININE 8.39* 6.25*  CALCIUM 8.0* 7.8*    COAG Lab Results  Component Value Date   INR 1.30 12/30/2016   No results found for: PTT  AGREE WITH THE ABOVE.  Jacob [name]

## 2017-03-03 DIAGNOSIS — K219 Gastro-esophageal reflux disease without esophagitis: Secondary | ICD-10-CM | POA: Diagnosis present

## 2017-03-03 DIAGNOSIS — Z9862 Peripheral vascular angioplasty status: Secondary | ICD-10-CM | POA: Diagnosis not present

## 2017-03-03 DIAGNOSIS — D649 Anemia, unspecified: Secondary | ICD-10-CM

## 2017-03-03 DIAGNOSIS — I10 Essential (primary) hypertension: Secondary | ICD-10-CM | POA: Diagnosis not present

## 2017-03-03 DIAGNOSIS — R001 Bradycardia, unspecified: Secondary | ICD-10-CM | POA: Diagnosis present

## 2017-03-03 DIAGNOSIS — Z89511 Acquired absence of right leg below knee: Secondary | ICD-10-CM | POA: Diagnosis not present

## 2017-03-03 DIAGNOSIS — R7881 Bacteremia: Secondary | ICD-10-CM | POA: Diagnosis not present

## 2017-03-03 DIAGNOSIS — Z79899 Other long term (current) drug therapy: Secondary | ICD-10-CM | POA: Diagnosis not present

## 2017-03-03 DIAGNOSIS — D509 Iron deficiency anemia, unspecified: Secondary | ICD-10-CM | POA: Diagnosis not present

## 2017-03-03 DIAGNOSIS — Z9842 Cataract extraction status, left eye: Secondary | ICD-10-CM | POA: Diagnosis not present

## 2017-03-03 DIAGNOSIS — B9562 Methicillin resistant Staphylococcus aureus infection as the cause of diseases classified elsewhere: Secondary | ICD-10-CM | POA: Diagnosis not present

## 2017-03-03 DIAGNOSIS — I70245 Atherosclerosis of native arteries of left leg with ulceration of other part of foot: Secondary | ICD-10-CM | POA: Diagnosis not present

## 2017-03-03 DIAGNOSIS — Z66 Do not resuscitate: Secondary | ICD-10-CM | POA: Diagnosis not present

## 2017-03-03 DIAGNOSIS — R040 Epistaxis: Secondary | ICD-10-CM | POA: Diagnosis not present

## 2017-03-03 DIAGNOSIS — I420 Dilated cardiomyopathy: Secondary | ICD-10-CM | POA: Diagnosis not present

## 2017-03-03 DIAGNOSIS — E785 Hyperlipidemia, unspecified: Secondary | ICD-10-CM | POA: Diagnosis not present

## 2017-03-03 DIAGNOSIS — Z961 Presence of intraocular lens: Secondary | ICD-10-CM | POA: Diagnosis present

## 2017-03-03 DIAGNOSIS — D72829 Elevated white blood cell count, unspecified: Secondary | ICD-10-CM | POA: Diagnosis not present

## 2017-03-03 DIAGNOSIS — I120 Hypertensive chronic kidney disease with stage 5 chronic kidney disease or end stage renal disease: Secondary | ICD-10-CM | POA: Diagnosis present

## 2017-03-03 DIAGNOSIS — B999 Unspecified infectious disease: Secondary | ICD-10-CM | POA: Diagnosis not present

## 2017-03-03 DIAGNOSIS — T148XXD Other injury of unspecified body region, subsequent encounter: Secondary | ICD-10-CM | POA: Diagnosis not present

## 2017-03-03 DIAGNOSIS — R112 Nausea with vomiting, unspecified: Secondary | ICD-10-CM | POA: Diagnosis not present

## 2017-03-03 DIAGNOSIS — Z23 Encounter for immunization: Secondary | ICD-10-CM | POA: Diagnosis not present

## 2017-03-03 DIAGNOSIS — Z419 Encounter for procedure for purposes other than remedying health state, unspecified: Secondary | ICD-10-CM

## 2017-03-03 DIAGNOSIS — E11621 Type 2 diabetes mellitus with foot ulcer: Secondary | ICD-10-CM | POA: Diagnosis present

## 2017-03-03 DIAGNOSIS — E7800 Pure hypercholesterolemia, unspecified: Secondary | ICD-10-CM | POA: Diagnosis not present

## 2017-03-03 DIAGNOSIS — T82590D Other mechanical complication of surgically created arteriovenous fistula, subsequent encounter: Secondary | ICD-10-CM | POA: Diagnosis not present

## 2017-03-03 DIAGNOSIS — T8189XD Other complications of procedures, not elsewhere classified, subsequent encounter: Secondary | ICD-10-CM | POA: Diagnosis not present

## 2017-03-03 DIAGNOSIS — F319 Bipolar disorder, unspecified: Secondary | ICD-10-CM | POA: Diagnosis not present

## 2017-03-03 DIAGNOSIS — I739 Peripheral vascular disease, unspecified: Secondary | ICD-10-CM | POA: Diagnosis not present

## 2017-03-03 DIAGNOSIS — D518 Other vitamin B12 deficiency anemias: Secondary | ICD-10-CM | POA: Diagnosis not present

## 2017-03-03 DIAGNOSIS — Z992 Dependence on renal dialysis: Secondary | ICD-10-CM | POA: Diagnosis not present

## 2017-03-03 DIAGNOSIS — D688 Other specified coagulation defects: Secondary | ICD-10-CM | POA: Diagnosis not present

## 2017-03-03 DIAGNOSIS — Z4781 Encounter for orthopedic aftercare following surgical amputation: Secondary | ICD-10-CM | POA: Diagnosis not present

## 2017-03-03 DIAGNOSIS — Z9841 Cataract extraction status, right eye: Secondary | ICD-10-CM | POA: Diagnosis not present

## 2017-03-03 DIAGNOSIS — L97509 Non-pressure chronic ulcer of other part of unspecified foot with unspecified severity: Secondary | ICD-10-CM | POA: Diagnosis present

## 2017-03-03 DIAGNOSIS — T8743 Infection of amputation stump, right lower extremity: Secondary | ICD-10-CM | POA: Diagnosis not present

## 2017-03-03 DIAGNOSIS — E875 Hyperkalemia: Secondary | ICD-10-CM | POA: Diagnosis present

## 2017-03-03 DIAGNOSIS — I779 Disorder of arteries and arterioles, unspecified: Secondary | ICD-10-CM | POA: Diagnosis not present

## 2017-03-03 DIAGNOSIS — E871 Hypo-osmolality and hyponatremia: Secondary | ICD-10-CM | POA: Diagnosis not present

## 2017-03-03 DIAGNOSIS — E559 Vitamin D deficiency, unspecified: Secondary | ICD-10-CM | POA: Diagnosis not present

## 2017-03-03 DIAGNOSIS — I639 Cerebral infarction, unspecified: Secondary | ICD-10-CM | POA: Diagnosis not present

## 2017-03-03 DIAGNOSIS — Z515 Encounter for palliative care: Secondary | ICD-10-CM

## 2017-03-03 DIAGNOSIS — Z89431 Acquired absence of right foot: Secondary | ICD-10-CM | POA: Diagnosis not present

## 2017-03-03 DIAGNOSIS — M6281 Muscle weakness (generalized): Secondary | ICD-10-CM | POA: Diagnosis not present

## 2017-03-03 DIAGNOSIS — F419 Anxiety disorder, unspecified: Secondary | ICD-10-CM | POA: Diagnosis present

## 2017-03-03 DIAGNOSIS — I96 Gangrene, not elsewhere classified: Secondary | ICD-10-CM | POA: Diagnosis not present

## 2017-03-03 DIAGNOSIS — D500 Iron deficiency anemia secondary to blood loss (chronic): Secondary | ICD-10-CM | POA: Diagnosis not present

## 2017-03-03 DIAGNOSIS — E1122 Type 2 diabetes mellitus with diabetic chronic kidney disease: Secondary | ICD-10-CM | POA: Diagnosis present

## 2017-03-03 DIAGNOSIS — I429 Cardiomyopathy, unspecified: Secondary | ICD-10-CM | POA: Diagnosis present

## 2017-03-03 DIAGNOSIS — D631 Anemia in chronic kidney disease: Secondary | ICD-10-CM | POA: Diagnosis present

## 2017-03-03 DIAGNOSIS — E119 Type 2 diabetes mellitus without complications: Secondary | ICD-10-CM | POA: Diagnosis not present

## 2017-03-03 DIAGNOSIS — N401 Enlarged prostate with lower urinary tract symptoms: Secondary | ICD-10-CM | POA: Diagnosis not present

## 2017-03-03 DIAGNOSIS — E1151 Type 2 diabetes mellitus with diabetic peripheral angiopathy without gangrene: Secondary | ICD-10-CM | POA: Diagnosis present

## 2017-03-03 DIAGNOSIS — N400 Enlarged prostate without lower urinary tract symptoms: Secondary | ICD-10-CM | POA: Diagnosis present

## 2017-03-03 DIAGNOSIS — D638 Anemia in other chronic diseases classified elsewhere: Secondary | ICD-10-CM | POA: Diagnosis not present

## 2017-03-03 DIAGNOSIS — T8131XA Disruption of external operation (surgical) wound, not elsewhere classified, initial encounter: Secondary | ICD-10-CM | POA: Diagnosis not present

## 2017-03-03 DIAGNOSIS — R197 Diarrhea, unspecified: Secondary | ICD-10-CM | POA: Diagnosis not present

## 2017-03-03 DIAGNOSIS — S88019A Complete traumatic amputation at knee level, unspecified lower leg, initial encounter: Secondary | ICD-10-CM | POA: Diagnosis not present

## 2017-03-03 DIAGNOSIS — N186 End stage renal disease: Secondary | ICD-10-CM | POA: Diagnosis not present

## 2017-03-03 DIAGNOSIS — E039 Hypothyroidism, unspecified: Secondary | ICD-10-CM | POA: Diagnosis present

## 2017-03-03 DIAGNOSIS — K2970 Gastritis, unspecified, without bleeding: Secondary | ICD-10-CM | POA: Diagnosis not present

## 2017-03-03 DIAGNOSIS — D62 Acute posthemorrhagic anemia: Secondary | ICD-10-CM | POA: Diagnosis not present

## 2017-03-03 DIAGNOSIS — L7632 Postprocedural hematoma of skin and subcutaneous tissue following other procedure: Secondary | ICD-10-CM | POA: Diagnosis present

## 2017-03-03 DIAGNOSIS — Y838 Other surgical procedures as the cause of abnormal reaction of the patient, or of later complication, without mention of misadventure at the time of the procedure: Secondary | ICD-10-CM | POA: Diagnosis not present

## 2017-03-03 DIAGNOSIS — N2581 Secondary hyperparathyroidism of renal origin: Secondary | ICD-10-CM | POA: Diagnosis present

## 2017-03-03 DIAGNOSIS — L089 Local infection of the skin and subcutaneous tissue, unspecified: Secondary | ICD-10-CM | POA: Diagnosis not present

## 2017-03-03 LAB — RENAL FUNCTION PANEL
ALBUMIN: 1.9 g/dL — AB (ref 3.5–5.0)
ANION GAP: 7 (ref 5–15)
BUN: 44 mg/dL — AB (ref 6–20)
CALCIUM: 8 mg/dL — AB (ref 8.9–10.3)
CO2: 26 mmol/L (ref 22–32)
Chloride: 93 mmol/L — ABNORMAL LOW (ref 101–111)
Creatinine, Ser: 8.27 mg/dL — ABNORMAL HIGH (ref 0.61–1.24)
GFR calc Af Amer: 7 mL/min — ABNORMAL LOW (ref >60)
GFR calc non Af Amer: 6 mL/min — ABNORMAL LOW (ref >60)
GLUCOSE: 69 mg/dL (ref 65–99)
PHOSPHORUS: 5.2 mg/dL — AB (ref 2.5–4.6)
Potassium: 4.7 mmol/L (ref 3.5–5.1)
SODIUM: 126 mmol/L — AB (ref 135–145)

## 2017-03-03 LAB — CBC
HEMATOCRIT: 24.3 % — AB (ref 39.0–52.0)
HEMOGLOBIN: 7.2 g/dL — AB (ref 13.0–17.0)
MCH: 24.9 pg — AB (ref 26.0–34.0)
MCHC: 29.6 g/dL — AB (ref 30.0–36.0)
MCV: 84.1 fL (ref 78.0–100.0)
Platelets: 566 10*3/uL — ABNORMAL HIGH (ref 150–400)
RBC: 2.89 MIL/uL — ABNORMAL LOW (ref 4.22–5.81)
RDW: 17.7 % — AB (ref 11.5–15.5)
WBC: 13.2 10*3/uL — ABNORMAL HIGH (ref 4.0–10.5)

## 2017-03-03 LAB — GLUCOSE, CAPILLARY
GLUCOSE-CAPILLARY: 50 mg/dL — AB (ref 65–99)
GLUCOSE-CAPILLARY: 44 mg/dL — AB (ref 65–99)
GLUCOSE-CAPILLARY: 44 mg/dL — AB (ref 65–99)
GLUCOSE-CAPILLARY: 65 mg/dL (ref 65–99)
GLUCOSE-CAPILLARY: 114 mg/dL — AB (ref 65–99)
GLUCOSE-CAPILLARY: 153 mg/dL — AB (ref 65–99)
Glucose-Capillary: 52 mg/dL — ABNORMAL LOW (ref 65–99)
Glucose-Capillary: 78 mg/dL (ref 65–99)

## 2017-03-03 LAB — PREPARE RBC (CROSSMATCH)

## 2017-03-03 MED ORDER — HEPARIN SODIUM (PORCINE) 1000 UNIT/ML DIALYSIS
20.0000 [IU]/kg | INTRAMUSCULAR | Status: DC | PRN
  Administered 2017-03-03: 2300 [IU] via INTRAVENOUS_CENTRAL
  Filled 2017-03-03: qty 3

## 2017-03-03 MED ORDER — DOXERCALCIFEROL 4 MCG/2ML IV SOLN
INTRAVENOUS | Status: AC
  Filled 2017-03-03: qty 2

## 2017-03-03 MED ORDER — OXYCODONE-ACETAMINOPHEN 5-325 MG PO TABS
ORAL_TABLET | ORAL | Status: AC
  Filled 2017-03-03: qty 2

## 2017-03-03 MED ORDER — SODIUM CHLORIDE 0.9 % IV SOLN: Freq: Once | INTRAVENOUS | Status: DC

## 2017-03-03 MED ORDER — VANCOMYCIN HCL IN DEXTROSE 1-5 GM/200ML-% IV SOLN
INTRAVENOUS | Status: AC
  Filled 2017-03-03: qty 200

## 2017-03-03 NOTE — Progress Notes (Signed)
Clinical Social Worker facilitated patient discharge including contacting patient family and facility to confirm patient discharge plans.  Clinical information faxed to facility and family agreeable with plan.  CSW arranged ambulance transport via [facility] to [facility].  RN Katrina to call [phone] (pt will be placed in 202B) report prior to discharge.  Clinical Social Worker will sign off for now as social work intervention is no longer needed. Please consult [place] again if new need arises.  Rhea Pink, MSW, [place]

## 2017-03-03 NOTE — Progress Notes (Signed)
Physical Therapy Cancellation Note   03/03/17 1014  PT Visit Information  Last PT Received On 03/03/17  Reason Eval/Treat Not Completed Patient at procedure or test/unavailable; Pt in HD. PT will check on pt later as time allows.   History of Present Illness Patient is a 66 yo male admitted for Rt foot cellulitis with history of transmet. pt declining progression to BKA. PMH:  HTN, DM, anemia, ESRD on HD, anxiety, depression, CVA, NICM, arthritis   Earney Navy, PTA Pager: [phone]

## 2017-03-03 NOTE — Procedures (Signed)
Patient was seen on dialysis and the procedure was supervised.  BFR 400  Via AVF BP is  133/68.   Patient appears to be tolerating treatment well  Jacob [name] A 03/03/2017

## 2017-03-03 NOTE — Progress Notes (Signed)
Orthopedic Tech Progress Note Patient Details:  Jacob [name] 11-08-1950 [id number]  Patient ID: Jacob [name], male   DOB: 1951-03-31, 66 y.o.   MRN: [id number]   Hildred Priest 03/03/2017, 9:42 AM Called in bio-tech brace order; spoke with [facility]

## 2017-03-03 NOTE — Progress Notes (Signed)
Patient has been given two containers of apple juice, two packs of graham crackers and peanut butter. Blood sugar is still showing between 44 and 54-has been taken 4 times. Patient is asymptomatic. Called dialysis back to advise Jacob [name] what he had been given and his results.

## 2017-03-03 NOTE — NC FL2 (Signed)
[place] LEVEL OF CARE SCREENING TOOL     IDENTIFICATION  Patient Name: Jacob [name] Birthdate: 15-Nov-1950 Sex: male Admission Date (Current Location): 02/18/2017  [facility] and [place] Number:  [employer] and Address:  [facility] [place], [place] [address], [place], [place] [address]      Provider Number: [id number]  Attending Physician Name and Address:  Roney Jaffe, MD  Relative Name and Phone Number:       Current Level of Care: Hospital Recommended Level of Care: [place] Prior Approval Number:    Date Approved/Denied:   PASRR Number: [id number]  Discharge Plan: SNF    Current Diagnoses: Patient Active Problem List   Diagnosis Date Noted  . MRSA bacteremia 03/02/2017  . PAD (peripheral artery disease) ([place])   . Infection   . Post-operative pain   . Acute blood loss anemia   . Anemia of chronic disease   . Leukocytosis   . DNR (do not resuscitate) 02/24/2017  . Palliative care by specialist 02/24/2017  . Right foot infection   . Normocytic anemia 02/18/2017  . Wound infection 02/18/2017  . Hyponatremia 02/18/2017  . Hypokalemia 12/28/2016  . Insulin-requiring or dependent type II diabetes mellitus ([place]) 12/28/2016  . Gangrene of foot ([place]) 12/28/2016  . Gangrene of right foot ([place]) 12/28/2016  . Hyperkalemia 04/05/2016  . Inadequate dialysis 04/05/2016  . Other complications due to renal dialysis device, implant, and graft 04/28/2013  . Subclavian vein occlusion (HCC) 04/28/2013  . Preoperative cardiovascular examination 12/28/2012  . Mixed hyperlipidemia 06/16/2011  . Nonischemic cardiomyopathy ([place]) 11/20/2010  . End-stage renal disease on hemodialysis ([place]) 11/20/2010  . Essential hypertension, benign 11/20/2010    Orientation RESPIRATION BLADDER Height & Weight     Self, Time, Situation, Place  Normal Continent Weight: 261 lb 3.9 oz (118.5 kg) (Bed Scale) Height:  6\' 2"  (188 cm)   BEHAVIORAL SYMPTOMS/MOOD NEUROLOGICAL BOWEL NUTRITION STATUS      Continent Diet (Renal, carb modified)  AMBULATORY STATUS COMMUNICATION OF NEEDS Skin   Extensive Assist Verbally Surgical wounds (Amputation )                       Personal Care Assistance Level of Assistance  Bathing, Dressing Bathing Assistance: Maximum assistance   Dressing Assistance: Maximum assistance     Functional Limitations Info             SPECIAL CARE FACTORS FREQUENCY  PT (By licensed PT), OT (By licensed OT)     PT Frequency: 5x/wk OT Frequency: 5x/wk            Contractures      Additional Factors Info  Code Status, Allergies, Isolation Precautions Code Status Info: DNR Allergies Info: Bee Venom, Iodine, Penicillins, Sulfadiazine     Isolation Precautions Info: Contact precautions, MRSA     Current Medications (03/03/2017):  This is the current hospital active medication list Current Facility-Administered Medications  Medication Dose Route Frequency Provider Last Rate Last Dose  . 0.9 %  sodium chloride infusion  250 mL Intravenous PRN Opyd, Ilene Qua, MD 0 mL/hr at 02/28/17 0830    . 0.9 %  sodium chloride infusion  500 mL Intravenous Once PRN [name], MD   Stopped at 02/23/17 2300  . acetaminophen (TYLENOL) tablet 1,000 mg  1,000 mg Oral Q6H PRN Opyd, Ilene Qua, MD   1,000 mg at 02/20/17 1732  . albuterol (PROVENTIL) (2.5 MG/3ML) 0.083%  nebulizer solution 2.5 mg  2.5 mg Nebulization Q6H PRN Ritta Slot, NP      . alum & mag hydroxide-simeth (MAALOX/MYLANTA) 200-200-20 MG/5ML suspension 15-30 mL  15-30 mL Oral Q2H PRN [name], MD      . aspirin EC tablet 325 mg  325 mg Oral Daily Opyd, Ilene Qua, MD   325 mg at 03/02/17 0949  . atorvastatin (LIPITOR) tablet 40 mg  40 mg Oral q1800 Opyd, Ilene Qua, MD   40 mg at 03/02/17 1707  . bisacodyl (DULCOLAX) EC tablet 10 mg  10 mg Oral Daily PRN Opyd, Ilene Qua, MD   10 mg at 02/27/17 2022  . carvedilol (COREG) tablet  12.5 mg  12.5 mg Oral 2 times per day on Sun Tue Thu Sat Nita Sells, MD   12.5 mg at 03/02/17 1707  . cinacalcet (SENSIPAR) tablet 30 mg  30 mg Oral Q breakfast Opyd, Ilene Qua, MD   30 mg at 03/02/17 0949  . clopidogrel (PLAVIX) tablet 75 mg  75 mg Oral Daily Opyd, Ilene Qua, MD   75 mg at 03/02/17 0948  . [START ON 03/05/2017] Darbepoetin Alfa (ARANESP) injection 100 mcg  100 mcg Intravenous Q Fri-HD Corliss Parish, MD      . dextromethorphan (DELSYM) 30 MG/5ML liquid 30 mg  30 mg Oral BID PRN Opyd, Ilene Qua, MD      . doxercalciferol (HECTOROL) injection 1.5 mcg  1.5 mcg Intravenous Q M,W,F-HD Pearson Grippe B, MD   1.5 mcg at 03/01/17 1014  . feeding supplement (NEPRO CARB STEADY) liquid 237 mL  237 mL Oral Q1500 Nita Sells, MD   237 mL at 03/02/17 1500  . feeding supplement (PRO-STAT SUGAR FREE 64) liquid 30 mL  30 mL Oral Daily Nita Sells, MD   30 mL at 03/02/17 0948  . finasteride (PROSCAR) tablet 5 mg  5 mg Oral Daily Nita Sells, MD   5 mg at 03/02/17 0949  . heparin injection 2,300 Units  20 Units/kg Dialysis PRN Corliss Parish, MD   2,300 Units at 03/03/17 0715  . heparin injection 5,000 Units  5,000 Units Subcutaneous Q8H Rhyne, Samantha J, PA-C   5,000 Units at 03/03/17 5427  . hydrALAZINE (APRESOLINE) injection 5 mg  5 mg Intravenous Q20 Min PRN [name], MD      . HYDROmorphone (DILAUDID) injection 1 mg  1 mg Intravenous Q2H PRN Janece Canterbury, MD   1 mg at 03/02/17 2133  . insulin aspart (novoLOG) injection 0-5 Units  0-5 Units Subcutaneous QHS Opyd, Ilene Qua, MD   Stopped at 03/01/17 2041  . insulin aspart (novoLOG) injection 0-9 Units  0-9 Units Subcutaneous TID WC Opyd, Ilene Qua, MD   1 Units at 03/02/17 1658  . insulin glargine (LANTUS) injection 15 Units  15 Units Subcutaneous QHS Vianne Bulls, MD   15 Units at 03/02/17 2128  . labetalol (NORMODYNE,TRANDATE) injection 10 mg  10 mg Intravenous Q10 min PRN [name], MD      . loratadine (CLARITIN) tablet 10 mg  10 mg Oral Daily PRN Opyd, Ilene Qua, MD   10 mg at 02/23/17 0425  . magnesium sulfate IVPB 2 g 50 mL  2 g Intravenous Daily PRN Rhyne, Samantha J, PA-C      . metoprolol tartrate (LOPRESSOR) injection 2-5 mg  2-5 mg Intravenous Q2H PRN [name], MD      . multivitamin (RENA-VIT) tablet 1 tablet  1 tablet Oral  Q M,W,F-2000 Vianne Bulls, MD   1 tablet at 03/01/17 2020  . ondansetron (ZOFRAN) tablet 4 mg  4 mg Oral Q6H PRN Opyd, Ilene Qua, MD       Or  . ondansetron (ZOFRAN) injection 4 mg  4 mg Intravenous Q6H PRN Opyd, Ilene Qua, MD      . oxyCODONE-acetaminophen (PERCOCET/ROXICET) 5-325 MG per tablet 1-2 tablet  1-2 tablet Oral Q4H PRN Rhyne, Hulen Shouts, PA-C   2 tablet at 03/03/17 0522  . pantoprazole (PROTONIX) EC tablet 40 mg  40 mg Oral Daily Opyd, Ilene Qua, MD   40 mg at 03/02/17 0949  . phenol (CHLORASEPTIC) mouth spray 1 spray  1 spray Mouth/Throat PRN [name], MD      . sevelamer carbonate (RENVELA) tablet 2,400 mg  2,400 mg Oral TID WC Opyd, Ilene Qua, MD   2,400 mg at 03/02/17 1659  . sevelamer carbonate (RENVELA) tablet 800 mg  800 mg Oral BID PRN Opyd, Ilene Qua, MD      . sodium chloride flush (NS) 0.9 % injection 3 mL  3 mL Intravenous Q12H Opyd, Ilene Qua, MD   3 mL at 03/02/17 0951  . sodium chloride flush (NS) 0.9 % injection 3 mL  3 mL Intravenous PRN Opyd, Ilene Qua, MD      . triamcinolone 0.1 % cream : eucerin cream, 1:1   Topical TID Short, Mackenzie, MD      . vancomycin (VANCOCIN) IVPB 1000 mg/200 mL premix  1,000 mg Intravenous Q M,W,F-HD Rumbarger, Valeda Malm, [facility]   Stopped at 03/01/17 1305     Discharge Medications: Please see discharge summary for a list of discharge medications.  Relevant Imaging Results:  Relevant Lab Results:   Additional Information SS#: [id number], Patient has dialysis MWF at [facility] in [facility], [place]

## 2017-03-03 NOTE — Progress Notes (Signed)
Subjective:  Ended up not going to SNF yest, low blood sugar thiis AM as well as hgb 7.2- seen in HD Objective Vital signs in last 24 hours: Vitals:   03/03/17 0410 03/03/17 0705 03/03/17 0715 03/03/17 0800  BP: (!) 120/42 134/69 133/75 136/67  Pulse: 65 60 60 60  Resp: 17 16 17 18   Temp: 97.6 F (36.4 C) 97.7 F (36.5 C)    TempSrc: Oral Oral    SpO2: 100% 99%    Weight: 119.7 kg (264 lb) 118.5 kg (261 lb 3.9 oz)    Height:       Weight change: 0.45 kg (15.9 oz)  Intake/Output Summary (Last 24 hours) at 03/03/17 0914 Last data filed at 03/02/17 1858  Gross per 24 hour  Intake              720 ml  Output                0 ml  Net              720 ml    Assessment/ Plan: Pt is [name] 66 y.o. yo male with ESRD who was admitted on 02/18/2017 with gangrene- s/p right BKA on 7/29  Assessment/Plan: 1. S/p right BKA- per [facility] and rehab- POD #3- being discharged to SNF today  2. ESRD - normally MWF- [facility] -  continue on schedule, HD this AM here at [facility]   3. Anemia- hgb in the 7's- post op and CKD- on relatively low dose aranesp- inc and transfuse as needed - will give unit today on way out the door 4. Secondary hyperparathyroidism- continue home sensipar , hectorol and renvela- phos, calc  good  5. HTN/volume- seems OK- on oral coreg  6. Dispo- if he goes to SNF for rehab, make sure can provide transportation to his HD unit.  Next treatment there will be due Friday  Jacob [name]    Labs: Basic Metabolic Panel:  Recent Labs Lab 02/26/17 1258  02/28/17 1341 03/01/17 0633 03/02/17 0324 03/03/17 0724  NA 131*  < > 131* 128* 130* 126*  K 4.5  < > 4.5 5.3* 4.6 4.7  CL 94*  --  94* 93* 95* 93*  CO2 26  --  25 25 27 26   GLUCOSE 146*  < > 156* 122* 134* 69  BUN 38*  --  39* 48* 29* 44*  CREATININE 7.98*  --  7.37* 8.39* 6.25* 8.27*  CALCIUM 8.2*  --  8.1* 8.0* 7.8* 8.0*  PHOS 3.3  --  3.6  --   --  5.2*  < > = values in this interval not displayed. Liver  Function Tests:  Recent Labs Lab 02/26/17 1258 02/28/17 1341 03/03/17 0724  ALBUMIN 1.8* 2.0* 1.9*   No results for input(s): LIPASE, AMYLASE in the last 168 hours. No results for input(s): AMMONIA in the last 168 hours. CBC:  Recent Labs Lab 02/26/17 1258  02/28/17 1346 03/01/17 0633 03/02/17 0324 03/03/17 0724  WBC 16.5*  --  17.8* 16.0* 14.2* 13.2*  HGB 8.4*  < > 7.8* 7.9* 7.4* 7.2*  HCT 27.6*  < > 26.1* 25.8* 25.0* 24.3*  MCV 82.6  --  83.1 83.8 83.9 84.1  PLT 581*  --  510* 456* 536* 566*  < > = values in this interval not displayed. Cardiac Enzymes: No results for input(s): CKTOTAL, CKMB, CKMBINDEX, TROPONINI in the last 168 hours. CBG:  Recent Labs Lab 03/03/17 [phone] 03/03/17 9767 03/03/17 3419  03/03/17 0729 03/03/17 0849  GLUCAP 44* 52* 44* 78 65    Iron Studies: No results for input(s): IRON, TIBC, TRANSFERRIN, FERRITIN in the last 72 hours. Studies/Results: No results found. Medications: Infusions: . sodium chloride 0 mL/hr at 02/28/17 0830  . sodium chloride Stopped (02/23/17 2300)  . sodium chloride    . magnesium sulfate 1 - 4 g bolus IVPB    . vancomycin Stopped (03/01/17 1305)    Scheduled Medications: . aspirin EC  325 mg Oral Daily  . atorvastatin  40 mg Oral q1800  . carvedilol  12.5 mg Oral 2 times per day on Sun Tue Thu Sat  . cinacalcet  30 mg Oral Q breakfast  . clopidogrel  75 mg Oral Daily  . [START ON 03/05/2017] darbepoetin (ARANESP) injection - DIALYSIS  100 mcg Intravenous Q Fri-HD  . doxercalciferol  1.5 mcg Intravenous Q M,W,F-HD  . feeding supplement (NEPRO CARB STEADY)  237 mL Oral Q1500  . feeding supplement (PRO-STAT SUGAR FREE 64)  30 mL Oral Daily  . finasteride  5 mg Oral Daily  . heparin  5,000 Units Subcutaneous Q8H  . insulin aspart  0-5 Units Subcutaneous QHS  . insulin aspart  0-9 Units Subcutaneous TID WC  . insulin glargine  15 Units Subcutaneous QHS  . multivitamin  1 tablet Oral Q M,W,F-2000  . pantoprazole   40 mg Oral Daily  . sevelamer carbonate  2,400 mg Oral TID WC  . sodium chloride flush  3 mL Intravenous Q12H  . triamcinolone 0.1 % cream : eucerin   Topical TID    have reviewed scheduled and prn medications.  Physical Exam: General: NAD Heart: RRR Lungs: mostly clear Abdomen: obese, soft Extremities: minimal edema  Dialysis Access: right AVF- thrill and bruit- accessed for HD   03/03/2017,9:14 AM  LOS: 13 days

## 2017-03-04 LAB — BPAM RBC
BLOOD PRODUCT EXPIRATION DATE: 201808292359
ISSUE DATE / TIME: 201808011003
UNIT TYPE AND RH: 5100

## 2017-03-04 LAB — TYPE AND SCREEN
ABO/RH(D): O POS
Antibody Screen: NEGATIVE
UNIT DIVISION: 0

## 2017-03-05 DIAGNOSIS — T8743 Infection of amputation stump, right lower extremity: Secondary | ICD-10-CM | POA: Diagnosis not present

## 2017-03-05 DIAGNOSIS — N2581 Secondary hyperparathyroidism of renal origin: Secondary | ICD-10-CM | POA: Diagnosis not present

## 2017-03-05 DIAGNOSIS — D509 Iron deficiency anemia, unspecified: Secondary | ICD-10-CM | POA: Diagnosis not present

## 2017-03-05 DIAGNOSIS — N186 End stage renal disease: Secondary | ICD-10-CM | POA: Diagnosis not present

## 2017-03-05 DIAGNOSIS — B9562 Methicillin resistant Staphylococcus aureus infection as the cause of diseases classified elsewhere: Secondary | ICD-10-CM | POA: Diagnosis not present

## 2017-03-05 DIAGNOSIS — D631 Anemia in chronic kidney disease: Secondary | ICD-10-CM | POA: Diagnosis not present

## 2017-03-08 DIAGNOSIS — T8743 Infection of amputation stump, right lower extremity: Secondary | ICD-10-CM | POA: Diagnosis not present

## 2017-03-08 DIAGNOSIS — N2581 Secondary hyperparathyroidism of renal origin: Secondary | ICD-10-CM | POA: Diagnosis not present

## 2017-03-08 DIAGNOSIS — D631 Anemia in chronic kidney disease: Secondary | ICD-10-CM | POA: Diagnosis not present

## 2017-03-08 DIAGNOSIS — N186 End stage renal disease: Secondary | ICD-10-CM | POA: Diagnosis not present

## 2017-03-08 DIAGNOSIS — B9562 Methicillin resistant Staphylococcus aureus infection as the cause of diseases classified elsewhere: Secondary | ICD-10-CM | POA: Diagnosis not present

## 2017-03-08 DIAGNOSIS — D509 Iron deficiency anemia, unspecified: Secondary | ICD-10-CM | POA: Diagnosis not present

## 2017-03-09 ENCOUNTER — Encounter: Payer: Self-pay | Admitting: Family

## 2017-03-09 ENCOUNTER — Other Ambulatory Visit: Payer: Self-pay | Admitting: Licensed Clinical Social Worker

## 2017-03-09 DIAGNOSIS — E119 Type 2 diabetes mellitus without complications: Secondary | ICD-10-CM | POA: Diagnosis not present

## 2017-03-09 DIAGNOSIS — T8131XA Disruption of external operation (surgical) wound, not elsewhere classified, initial encounter: Secondary | ICD-10-CM | POA: Diagnosis not present

## 2017-03-09 DIAGNOSIS — N186 End stage renal disease: Secondary | ICD-10-CM | POA: Diagnosis not present

## 2017-03-09 DIAGNOSIS — I10 Essential (primary) hypertension: Secondary | ICD-10-CM | POA: Diagnosis not present

## 2017-03-09 NOTE — Patient Outreach (Signed)
[facility]) Care Management  [facility] Social Work  03/09/2017  LEWI DROST March 01, 1951 [id number]  Subjective:    Objective:   Encounter Medications:  Outpatient Encounter Prescriptions as of 03/09/2017  Medication Sig  . acetaminophen (TYLENOL) 500 MG tablet Take 1,000 mg by mouth every 6 (six) hours as needed for mild pain.  [name] aspirin EC 81 MG tablet Take 1 tablet (81 mg total) by mouth daily.  [name] atorvastatin (LIPITOR) 40 MG tablet Take 1 tablet (40 mg total) by mouth daily at 6 PM.  . B Complex-C-Folic Acid (DIALYVITE TABLET) TABS Take 1 tablet by mouth every Monday, Wednesday, and Friday.   . bisacodyl (DULCOLAX) 5 MG EC tablet Take 2 tablets (10 mg total) by mouth daily as needed for moderate constipation.  . carvedilol (COREG) 12.5 MG tablet Take 1 tablet (12.5 mg total) by mouth 2 (two) times daily with a meal. 2 times per day on Sun, Tu, Thur, and Sat.  Hold on dialysis days.  . cinacalcet (SENSIPAR) 30 MG tablet Take 30 mg by mouth daily.  . clopidogrel (PLAVIX) 75 MG tablet Take 1 tablet (75 mg total) by mouth daily with breakfast. (Patient not taking: Reported on 02/18/2017)  . EPIPEN 2-PAK 0.3 MG/0.3ML SOAJ injection Inject 0.3 mg into the muscle once as needed (For anaphylaxis.).   [name] finasteride (PROSCAR) 5 MG tablet Take 1 tablet (5 mg total) by mouth daily.  [name] HYDROcodone-acetaminophen (NORCO/VICODIN) 5-325 MG tablet Take 1 tablet by mouth every 4 (four) hours as needed for moderate pain.  [name] LANTUS SOLOSTAR 100 UNIT/ML Solostar Pen Inject 15 Units into the skin at bedtime.  . Nutritional Supplements (FEEDING SUPPLEMENT, NEPRO CARB STEADY,) LIQD Take 237 mLs by mouth daily at 3 pm.  . omeprazole (PRILOSEC) 40 MG capsule Take 40 mg by mouth daily.   . polyethylene glycol powder (GLYCOLAX/MIRALAX) powder Take 17 g by mouth daily.  [name] RENVELA 800 MG tablet Take 800-2,400 mg by mouth 3 (three) times daily with meals. Take 2400 mg by mouth 3 times daily with meals and  800 mg by mouth with snacks  . senna (SENOKOT) 8.6 MG TABS tablet Take 2 tablets (17.2 mg total) by mouth at bedtime.  . vancomycin (VANCOCIN) 1-5 GM/200ML-% SOLN Inject 200 mLs (1,000 mg total) into the vein every Monday, Wednesday, and Friday with hemodialysis.   No facility-administered encounter medications on file as of 03/09/2017.     Functional Status:  In your present state of health, do you have any difficulty performing the following activities: 02/23/2017 02/18/2017  Hearing? N N  Vision? N N  Difficulty concentrating or making decisions? N N  Walking or climbing stairs? Y Y  Dressing or bathing? Y Y  Doing errands, shopping? Y -  Comment can go short distances -  Some recent data might be hidden    Fall/Depression Screening:  PHQ 2/9 Scores 01/29/2017  PHQ - 2 Score 2  PHQ- 9 Score 6    Assessment:   CSW traveled to [facility] in [place], [place] on 03/09/17 to visit client. CSW met with client on 03/09/17 at client's room at [facility] facility. Client is receiving nursing care at facility. Client is receiving physical therapy sessions as scheduled for client at facility. Client is eating adequately and sleeping adequately.  CSW and client spoke of client care plan. CSW encouraged client to participate in all scheduled client physical therapy sessions for client for next 30 days at nursing  center. Client said he thinks physical therapy sessions are helpful to client.  Client has support and visits from his sister.  Client said he feels that his appetite is normal now.  Client said he spoke with facility social worker today about client discharge plan.  CSW encouraged client to communicate with facility social worker as needed to plan for client discharge.  Client wears glasses to assist with vision.  Client said he has appointment on 03/20/17 with surgeon in [place], [place] (follow up appointment following surgery).  CSW gave client [facility] CSW card. CSW  encouraged client to call CSW at [phone] as needed to discuss social work needs of client.  Client was appreciative of visit of CSW with client on 03/09/17.   Plan:  Client to participate in all scheduled client physical therapy sessions for client in next 30 days at nursing center.    CSW to call client in 4 weeks to assess client needs at that time.  Norva Riffle.Bessy Reaney MSW, LCSW Licensed Clinical Social Worker [facility] Care Management [phone]

## 2017-03-10 DIAGNOSIS — D509 Iron deficiency anemia, unspecified: Secondary | ICD-10-CM | POA: Diagnosis not present

## 2017-03-10 DIAGNOSIS — T8743 Infection of amputation stump, right lower extremity: Secondary | ICD-10-CM | POA: Diagnosis not present

## 2017-03-10 DIAGNOSIS — N186 End stage renal disease: Secondary | ICD-10-CM | POA: Diagnosis not present

## 2017-03-10 DIAGNOSIS — N2581 Secondary hyperparathyroidism of renal origin: Secondary | ICD-10-CM | POA: Diagnosis not present

## 2017-03-10 DIAGNOSIS — B9562 Methicillin resistant Staphylococcus aureus infection as the cause of diseases classified elsewhere: Secondary | ICD-10-CM | POA: Diagnosis not present

## 2017-03-10 DIAGNOSIS — D631 Anemia in chronic kidney disease: Secondary | ICD-10-CM | POA: Diagnosis not present

## 2017-03-11 ENCOUNTER — Ambulatory Visit: Payer: Self-pay | Admitting: *Deleted

## 2017-03-12 DIAGNOSIS — B9562 Methicillin resistant Staphylococcus aureus infection as the cause of diseases classified elsewhere: Secondary | ICD-10-CM | POA: Diagnosis not present

## 2017-03-12 DIAGNOSIS — N186 End stage renal disease: Secondary | ICD-10-CM | POA: Diagnosis not present

## 2017-03-12 DIAGNOSIS — T8743 Infection of amputation stump, right lower extremity: Secondary | ICD-10-CM | POA: Diagnosis not present

## 2017-03-12 DIAGNOSIS — D509 Iron deficiency anemia, unspecified: Secondary | ICD-10-CM | POA: Diagnosis not present

## 2017-03-12 DIAGNOSIS — D631 Anemia in chronic kidney disease: Secondary | ICD-10-CM | POA: Diagnosis not present

## 2017-03-12 DIAGNOSIS — N2581 Secondary hyperparathyroidism of renal origin: Secondary | ICD-10-CM | POA: Diagnosis not present

## 2017-03-15 ENCOUNTER — Ambulatory Visit: Payer: Medicare Other | Admitting: Surgery

## 2017-03-15 DIAGNOSIS — B9562 Methicillin resistant Staphylococcus aureus infection as the cause of diseases classified elsewhere: Secondary | ICD-10-CM | POA: Diagnosis not present

## 2017-03-15 DIAGNOSIS — N2581 Secondary hyperparathyroidism of renal origin: Secondary | ICD-10-CM | POA: Diagnosis not present

## 2017-03-15 DIAGNOSIS — D631 Anemia in chronic kidney disease: Secondary | ICD-10-CM | POA: Diagnosis not present

## 2017-03-15 DIAGNOSIS — D509 Iron deficiency anemia, unspecified: Secondary | ICD-10-CM | POA: Diagnosis not present

## 2017-03-15 DIAGNOSIS — N186 End stage renal disease: Secondary | ICD-10-CM | POA: Diagnosis not present

## 2017-03-15 DIAGNOSIS — T8743 Infection of amputation stump, right lower extremity: Secondary | ICD-10-CM | POA: Diagnosis not present

## 2017-03-16 ENCOUNTER — Other Ambulatory Visit: Payer: Self-pay | Admitting: Licensed Clinical Social Worker

## 2017-03-16 NOTE — Patient Outreach (Signed)
Assessment:  CSW spoke via phone with client. CSW verified client identity. CSW received verbal permission form client on 03/16/17 for CSW to communicate with client about current client needs and status. Client is receiving care at [facility] in [place], [place]. Client is receiving nursing care at facility. Client is also receiving physical therapy sessions, as scheduled for client, at nursing facility. Client uses a wheelchair for ambulation.  Client said he was able to transfer in and out of wheelchair. Client said he is eating adequately and is sleeping adequately.  CSW and client spoke of client care plan. CSW encouraged client to participate in all scheduled client physical therapy sessions for client in next 30 days at nursing center. Client said he is participating in physical therapy sessions as scheduled for client. Client said he has regular visits from his sister. He said that his sister is supportive of client.  Client said he has a follow up appointment on 03/18/17 with surgeon in [place], [place]. Client said he was not having any pain issues at present. Client said he hopes to receive physical therapy sessions as needed for client at facility and then hopes to be able to return to his home in the community with supports in place. CSW thanked client for phone call with CSW on 03/16/17. Client was appreciative of phone call from [place] on 03/16/17.    Plan:  Client to participate in all scheduled client physical therapy  sessions for client in next 30 days at nursing center.   CSW to call client in 3 weeks to assess client needs at that time.  Norva Riffle.Oney Tatlock MSW, LCSW Licensed Clinical Social Worker [facility] Care Management [phone]

## 2017-03-17 DIAGNOSIS — D509 Iron deficiency anemia, unspecified: Secondary | ICD-10-CM | POA: Diagnosis not present

## 2017-03-17 DIAGNOSIS — B9562 Methicillin resistant Staphylococcus aureus infection as the cause of diseases classified elsewhere: Secondary | ICD-10-CM | POA: Diagnosis not present

## 2017-03-17 DIAGNOSIS — N2581 Secondary hyperparathyroidism of renal origin: Secondary | ICD-10-CM | POA: Diagnosis not present

## 2017-03-17 DIAGNOSIS — Z89511 Acquired absence of right leg below knee: Secondary | ICD-10-CM | POA: Diagnosis not present

## 2017-03-17 DIAGNOSIS — D631 Anemia in chronic kidney disease: Secondary | ICD-10-CM | POA: Diagnosis not present

## 2017-03-17 DIAGNOSIS — I10 Essential (primary) hypertension: Secondary | ICD-10-CM | POA: Diagnosis not present

## 2017-03-17 DIAGNOSIS — N186 End stage renal disease: Secondary | ICD-10-CM | POA: Diagnosis not present

## 2017-03-17 DIAGNOSIS — T8743 Infection of amputation stump, right lower extremity: Secondary | ICD-10-CM | POA: Diagnosis not present

## 2017-03-17 DIAGNOSIS — E119 Type 2 diabetes mellitus without complications: Secondary | ICD-10-CM | POA: Diagnosis not present

## 2017-03-18 ENCOUNTER — Encounter: Payer: Self-pay | Admitting: Family

## 2017-03-18 ENCOUNTER — Ambulatory Visit (INDEPENDENT_AMBULATORY_CARE_PROVIDER_SITE_OTHER): Payer: Medicare Other | Admitting: Family

## 2017-03-18 VITALS — BP 151/73 | HR 73 | Temp 97.5°F | Resp 20 | Ht 74.0 in | Wt 275.0 lb

## 2017-03-18 DIAGNOSIS — M7989 Other specified soft tissue disorders: Secondary | ICD-10-CM

## 2017-03-18 DIAGNOSIS — Z89511 Acquired absence of right leg below knee: Secondary | ICD-10-CM

## 2017-03-18 DIAGNOSIS — N186 End stage renal disease: Secondary | ICD-10-CM

## 2017-03-18 DIAGNOSIS — Z992 Dependence on renal dialysis: Secondary | ICD-10-CM | POA: Diagnosis not present

## 2017-03-18 DIAGNOSIS — T82590D Other mechanical complication of surgically created arteriovenous fistula, subsequent encounter: Secondary | ICD-10-CM

## 2017-03-18 DIAGNOSIS — I779 Disorder of arteries and arterioles, unspecified: Secondary | ICD-10-CM

## 2017-03-18 NOTE — Progress Notes (Signed)
Vitals:   03/18/17 0859 03/18/17 0911  BP: (!) 157/77 (!) 151/73  Pulse: 73   Resp: 20   Temp: (!) 97.5 F (36.4 C)   TempSrc: Oral   SpO2: 99%   Weight: 275 lb (124.7 kg)   Height: 6\' 2"  (1.88 m)

## 2017-03-18 NOTE — Patient Instructions (Signed)

## 2017-03-18 NOTE — Progress Notes (Signed)
Cc: Kidney Center request for evaluation of AVF, possible infiltration  History of Present Illness  Jacob [name] is a 66 y.o. ([birth year]) male who is s/p right BKA on 02-28-17 by Dr. Donnetta Hutching for nonhealing right transmetatarsal amputation with MRSA sepsis.  Dr. Trula Slade, Dr. Scot Dock, Dr. Bridgett Larsson, and Dr. Donzetta Matters have also been involved in his care and creation of HD access and PAOD.   Pt returns today for kidney center personnel concern re possible infiltration of HD access. Pt states the AV fistula site was swollen but is not any longer and states he has no trouble dialyzing.  He has had several previous HD access in his arms.   He denies any steal symptoms. He is hard of hearing.  He denies fever or chills, states the rehab center, [facility], changes the dressing at his right BKA stump.   He dialyzes M-W-F via right upper arm AVF.  His A1C on 12-28-16 was 7.7. He has never used tobacco.    Past Medical History:  Diagnosis Date  . Anemia   . Anemia in chronic kidney disease(285.21)   . Anxiety   . Arthritis   . Depression   . Dyspnea    with exertion  . ESRD (end stage renal disease) on dialysis [facility])    "MWF; [facility] [place]" (02/18/2017)  . Essential hypertension   . GERD (gastroesophageal reflux disease)   . Glaucoma   . History of blood transfusion   . Insomnia   . Nonischemic cardiomyopathy ([place])   . Stroke [facility]) 2016   - "they said it was not a stroke"  . Type 2 diabetes mellitus ([place])    Type II    Social History Social History  Substance Use Topics  . Smoking status: Never Smoker  . Smokeless tobacco: Never Used  . Alcohol use Yes     Comment: 1- fifth of gin a week    Family History Family History  Problem Relation Age of Onset  . Heart attack Brother        26  . Heart disease Brother        before age 72  . Hypertension Brother   . Heart attack Brother        45  . Heart attack Brother        79  . Diabetes Mother   . Hypertension Mother   .  Heart disease Mother   . Heart disease Father   . Hypertension Father   . Other Father        amputation  . Hypertension Sister   . Heart disease Sister   . Vision loss Maternal Uncle     Surgical History Past Surgical History:  Procedure Laterality Date  . A/V FISTULAGRAM Right 10/06/2016   Procedure: A/V Fistulagram;  Surgeon: [name], MD;  Location: [place] [facility];  Service: Cardiovascular;  Laterality: Right;  . ABDOMINAL AORTOGRAM N/A 12/30/2016   Procedure: Abdominal Aortogram;  Surgeon: Waynetta Sandy, MD;  Location: [place] [facility];  Service: Cardiovascular;  Laterality: N/A;  . ABDOMINAL AORTOGRAM W/LOWER EXTREMITY N/A 02/23/2017   Procedure: Abdominal Aortogram w/Lower Extremity;  Surgeon: [name], MD;  Location: [place] [facility];  Service: Cardiovascular;  Laterality: N/A;  Rt. leg  . AMPUTATION Right 02/28/2017   Procedure: RIGHT BELOW KNEE AMPUTATION;  Surgeon: [name], MD;  Location: [facility] OR;  Service: Vascular;  Laterality: Right;  . AV FISTULA PLACEMENT  Hx: of  . AV FISTULA PLACEMENT Right 05/04/2013   Procedure: ARTERIOVENOUS (AV) FISTULA CREATION- RIGHT ARM;  Surgeon: Conrad Cullowhee, MD;  Location: [place];  Service: Vascular;  Laterality: Right;  Ultrasound guided  . AV FISTULA PLACEMENT Right 07/28/2016   Procedure: BRACHIOCEPHALIC ARTERIOVENOUS (AV) FISTULA CREATION;  Surgeon: [name], MD;  Location: [place];  Service: Vascular;  Laterality: Right;  . CATARACT EXTRACTION W/PHACO Left 07/16/2014   Procedure: CATARACT EXTRACTION PHACO AND INTRAOCULAR LENS PLACEMENT (IOC);  Surgeon: [name], MD;  Location: [facility];  Service: Ophthalmology;  Laterality: Left;  CDE:8.86  . CATARACT EXTRACTION W/PHACO Right 07/30/2014   Procedure: CATARACT EXTRACTION PHACO AND INTRAOCULAR LENS PLACEMENT (IOC);  Surgeon: [name], MD;  Location: [facility];  Service: Ophthalmology;  Laterality: Right;  CDE 8.99  . COLONOSCOPY     Hx: of  .  FISTULA SUPERFICIALIZATION Right 07/28/2016   Procedure: FISTULA SUPERFICIALIZATION;  Surgeon: [name], MD;  Location: [place];  Service: Vascular;  Laterality: Right;  . FISTULA SUPERFICIALIZATION Right 10/13/2016   Procedure: BRACHIOCEPHALIC ARTERIOVENOUS FISTULA SUPERFICIALIZATION;  Surgeon: [name], MD;  Location: [place];  Service: Vascular;  Laterality: Right;  . FISTULOGRAM N/A 05/04/2013   Procedure: CENTRAL VENOGRAM;  Surgeon: Conrad Walton, MD;  Location: [place];  Service: Vascular;  Laterality: N/A;  . INSERTION OF DIALYSIS CATHETER Right 05/04/2013   Procedure: INSERTION OF DIALYSIS CATHETER;  Surgeon: Conrad [place], MD;  Location: [place];  Service: Vascular;  Laterality: Right;  Ultrasound guided  . INSERTION OF DIALYSIS CATHETER N/A 07/28/2016   Procedure: INSERTION OF DIALYSIS CATHETER;  Surgeon: [name], MD;  Location: [place];  Service: Vascular;  Laterality: N/A;  . LIGATION OF ARTERIOVENOUS  FISTULA Left 05/04/2013   Procedure: LIGATION OF LEFT RADIAL CEPHALIC ARTERIOVENOUS  FISTULA;  Surgeon: Conrad [place], MD;  Location: [place];  Service: Vascular;  Laterality: Left;  Ultrasound guided  . LIGATION OF ARTERIOVENOUS  FISTULA Right 07/28/2016   Procedure: LIGATION OF RADIOCEPHALIC ARTERIOVENOUS  FISTULA;  Surgeon: [name], MD;  Location: [place];  Service: Vascular;  Laterality: Right;  . LOWER EXTREMITY ANGIOGRAPHY N/A 12/30/2016   Procedure: Lower Extremity Angiography;  Surgeon: Waynetta Sandy, MD;  Location: [place] [facility];  Service: Cardiovascular;  Laterality: N/A;  . LUMBAR SPINE SURGERY    . PERIPHERAL VASCULAR ATHERECTOMY Right 12/30/2016   Procedure: Peripheral Vascular Atherectomy;  Surgeon: Waynetta Sandy, MD;  Location: [place] [facility];  Service: Cardiovascular;  Laterality: Right;  AT and PT  . PERIPHERAL VASCULAR BALLOON ANGIOPLASTY Right 12/30/2016   Procedure: Peripheral Vascular Balloon  Angioplasty;  Surgeon: Waynetta Sandy, MD;  Location: [place] [facility];  Service: Cardiovascular;  Laterality: Right;  PTA of PT and AT  . PERIPHERAL VASCULAR BALLOON ANGIOPLASTY  02/23/2017   Procedure: Peripheral Vascular Balloon Angioplasty;  Surgeon: [name], MD;  Location: [place] [facility];  Service: Cardiovascular;;  RT. Anterior Tib.  [name] PERIPHERAL VASCULAR CATHETERIZATION N/A 01/24/2015   Procedure: Fistulagram;  Surgeon: Conrad [place], MD;  Location: [place] [facility];  Service: Cardiovascular;  Laterality: N/A;  . PERIPHERAL VASCULAR CATHETERIZATION Right 06/04/2016   Procedure: A/V Shuntogram/Fistulagram;  Surgeon: Conrad [place], MD;  Location: [place] [facility];  Service: Cardiovascular;  Laterality: Right;  . REVISON OF ARTERIOVENOUS FISTULA Right 01/02/2014   Procedure: REVISON OF ARTERIOVENOUS FISTULA ANASTOMOSIS;  Surgeon: Conrad Lafferty, MD;  Location: [facility]  OR;  Service: Vascular;  Laterality: Right;  . REVISON OF ARTERIOVENOUS FISTULA Right 01/02/2016   Procedure: REVISION OF RADIOCEPHALIC ARTERIOVENOUS FISTULA  with BOVINE PATCH ANGIOPLASTY RIGHT RADIAL ARTERY;  Surgeon: Mal [name], MD;  Location: [place];  Service: Vascular;  Laterality: Right;  . SHUNTOGRAM N/A 11/07/2013   Procedure: Fistulogram;  Surgeon: [name], MD;  Location: [facility] CATH LAB;  Service: Cardiovascular;  Laterality: N/A;  . TRANSMETATARSAL AMPUTATION Right 01/01/2017   Procedure: TRANSMETATARSAL AMPUTATION;  Surgeon: [name], MD;  Location: [place];  Service: Vascular;  Laterality: Right;    Allergies  Allergen Reactions  . Bee Venom Anaphylaxis  . Iodine Swelling  . Penicillins Swelling and Other (See Comments)    SWELLING REACTION UNSPECIFIED   Has patient had a PCN reaction causing immediate rash, facial/tongue/throat swelling, SOB or lightheadedness with hypotension: No Has patient had a PCN reaction causing severe rash involving mucus membranes or skin necrosis: No Has  patient had a PCN reaction that required hospitalization No Has patient had a PCN reaction occurring within the last 10 years: No If all of the above answers are "NO", then may proceed with Cephalosporin use.  . Sulfadiazine Other (See Comments)    1% Silver Sulfadiazine cream causes burning over a large area of skin.    Current Outpatient Prescriptions  Medication Sig Dispense Refill  . acetaminophen (TYLENOL) 500 MG tablet Take 1,000 mg by mouth every 6 (six) hours as needed for mild pain.    [name] aspirin EC 81 MG tablet Take 1 tablet (81 mg total) by mouth daily. 90 tablet 0  . atorvastatin (LIPITOR) 40 MG tablet Take 1 tablet (40 mg total) by mouth daily at 6 PM. 30 tablet 0  . B Complex-C-Folic Acid (DIALYVITE TABLET) TABS Take 1 tablet by mouth every Monday, Wednesday, and Friday.     . bisacodyl (DULCOLAX) 5 MG EC tablet Take 2 tablets (10 mg total) by mouth daily as needed for moderate constipation. 30 tablet 0  . carvedilol (COREG) 12.5 MG tablet Take 1 tablet (12.5 mg total) by mouth 2 (two) times daily with a meal. 2 times per day on Sun, Tu, Thur, and Sat.  Hold on dialysis days. 60 tablet 0  . cinacalcet (SENSIPAR) 30 MG tablet Take 30 mg by mouth daily.    . clopidogrel (PLAVIX) 75 MG tablet Take 1 tablet (75 mg total) by mouth daily with breakfast. 30 tablet 0  . EPIPEN 2-PAK 0.3 MG/0.3ML SOAJ injection Inject 0.3 mg into the muscle once as needed (For anaphylaxis.).     [name] finasteride (PROSCAR) 5 MG tablet Take 1 tablet (5 mg total) by mouth daily. 30 tablet 0  . LANTUS SOLOSTAR 100 UNIT/ML Solostar Pen Inject 15 Units into the skin at bedtime. 15 mL 0  . omeprazole (PRILOSEC) 40 MG capsule Take 40 mg by mouth daily.     . polyethylene glycol powder (GLYCOLAX/MIRALAX) powder Take 17 g by mouth daily. 255 g 0  . RENVELA 800 MG tablet Take 800-2,400 mg by mouth 3 (three) times daily with meals. Take 2400 mg by mouth 3 times daily with meals and 800 mg by mouth with snacks    . senna  (SENOKOT) 8.6 MG TABS tablet Take 2 tablets (17.2 mg total) by mouth at bedtime. 120 each 0  . vancomycin (VANCOCIN) 1-5 GM/200ML-% SOLN Inject 200 mLs (1,000 mg total) into the vein every Monday, Wednesday, and Friday with hemodialysis. 4000 mL 0  . HYDROcodone-acetaminophen (  NORCO/VICODIN) 5-325 MG tablet Take 1 tablet by mouth every 4 (four) hours as needed for moderate pain. (Patient not taking: Reported on 03/18/2017) 10 tablet 0  . Nutritional Supplements (FEEDING SUPPLEMENT, NEPRO CARB STEADY,) LIQD Take 237 mLs by mouth daily at 3 pm. (Patient not taking: Reported on 03/18/2017) 30 Can 0   No current facility-administered medications for this visit.      REVIEW OF SYSTEMS: see HPI for pertinent positives and negatives    PHYSICAL EXAMINATION:  Vitals:   03/18/17 0859 03/18/17 0911  BP: (!) 157/77 (!) 151/73  Pulse: 73   Resp: 20   Temp: (!) 97.5 F (36.4 C)   TempSrc: Oral   SpO2: 99%   Weight: 275 lb (124.7 kg)   Height: 6\' 2"  (1.88 m)    Body mass index is 35.31 kg/m.  General: The patient appears their stated age, obese male seated in w/c.   HEENT:  No gross abnormalities Pulmonary: Respirations are non-labored Abdomen: Softly obese and non-tender. Musculoskeletal: Right BKA stump with bulky dressing.  Neurologic: No focal weakness or paresthesias are detected, is hard of hearing.  Skin: There are no ulcer or rashes noted. Psychiatric: The patient has normal affect. Cardiovascular: There is a regular rate and rhythm without significant murmur appreciated. The right upper arm AV fistula has a palpable thrill. Bilateral radial pulses are palpable.    Medical Decision Making  Jacob [name] is a 66 y.o. male who is s/p right BKA on 02-28-17.  He was referred by his kidney center for evaluation of suspected infiltration of right upper arm AVF. Minimal swelling at the right upper am AV fistula, pt states no problems dialyzing, he denies steal symptoms in his right upper  extremity. Apparently he had some swelling according to history, but this has resolved.   He is to return in 2-3 weeks for removal of staples from his right BKA.     NICKEL, Sharmon Leyden, RN, MSN, FNP-C [facility] Office: [phone]  03/18/2017, 9:11 AM  Clinic MD: Oneida Alar

## 2017-03-19 DIAGNOSIS — D509 Iron deficiency anemia, unspecified: Secondary | ICD-10-CM | POA: Diagnosis not present

## 2017-03-19 DIAGNOSIS — B9562 Methicillin resistant Staphylococcus aureus infection as the cause of diseases classified elsewhere: Secondary | ICD-10-CM | POA: Diagnosis not present

## 2017-03-19 DIAGNOSIS — N186 End stage renal disease: Secondary | ICD-10-CM | POA: Diagnosis not present

## 2017-03-19 DIAGNOSIS — N2581 Secondary hyperparathyroidism of renal origin: Secondary | ICD-10-CM | POA: Diagnosis not present

## 2017-03-19 DIAGNOSIS — T8743 Infection of amputation stump, right lower extremity: Secondary | ICD-10-CM | POA: Diagnosis not present

## 2017-03-19 DIAGNOSIS — D631 Anemia in chronic kidney disease: Secondary | ICD-10-CM | POA: Diagnosis not present

## 2017-03-22 DIAGNOSIS — B9562 Methicillin resistant Staphylococcus aureus infection as the cause of diseases classified elsewhere: Secondary | ICD-10-CM | POA: Diagnosis not present

## 2017-03-22 DIAGNOSIS — D509 Iron deficiency anemia, unspecified: Secondary | ICD-10-CM | POA: Diagnosis not present

## 2017-03-22 DIAGNOSIS — N186 End stage renal disease: Secondary | ICD-10-CM | POA: Diagnosis not present

## 2017-03-22 DIAGNOSIS — T8743 Infection of amputation stump, right lower extremity: Secondary | ICD-10-CM | POA: Diagnosis not present

## 2017-03-22 DIAGNOSIS — D631 Anemia in chronic kidney disease: Secondary | ICD-10-CM | POA: Diagnosis not present

## 2017-03-22 DIAGNOSIS — N2581 Secondary hyperparathyroidism of renal origin: Secondary | ICD-10-CM | POA: Diagnosis not present

## 2017-03-24 DIAGNOSIS — D631 Anemia in chronic kidney disease: Secondary | ICD-10-CM | POA: Diagnosis not present

## 2017-03-24 DIAGNOSIS — N2581 Secondary hyperparathyroidism of renal origin: Secondary | ICD-10-CM | POA: Diagnosis not present

## 2017-03-24 DIAGNOSIS — T8743 Infection of amputation stump, right lower extremity: Secondary | ICD-10-CM | POA: Diagnosis not present

## 2017-03-24 DIAGNOSIS — N186 End stage renal disease: Secondary | ICD-10-CM | POA: Diagnosis not present

## 2017-03-24 DIAGNOSIS — B9562 Methicillin resistant Staphylococcus aureus infection as the cause of diseases classified elsewhere: Secondary | ICD-10-CM | POA: Diagnosis not present

## 2017-03-24 DIAGNOSIS — D509 Iron deficiency anemia, unspecified: Secondary | ICD-10-CM | POA: Diagnosis not present

## 2017-03-25 ENCOUNTER — Encounter: Payer: Self-pay | Admitting: Family

## 2017-03-26 DIAGNOSIS — B9562 Methicillin resistant Staphylococcus aureus infection as the cause of diseases classified elsewhere: Secondary | ICD-10-CM | POA: Diagnosis not present

## 2017-03-26 DIAGNOSIS — N186 End stage renal disease: Secondary | ICD-10-CM | POA: Diagnosis not present

## 2017-03-26 DIAGNOSIS — D631 Anemia in chronic kidney disease: Secondary | ICD-10-CM | POA: Diagnosis not present

## 2017-03-26 DIAGNOSIS — D509 Iron deficiency anemia, unspecified: Secondary | ICD-10-CM | POA: Diagnosis not present

## 2017-03-26 DIAGNOSIS — N2581 Secondary hyperparathyroidism of renal origin: Secondary | ICD-10-CM | POA: Diagnosis not present

## 2017-03-26 DIAGNOSIS — T8743 Infection of amputation stump, right lower extremity: Secondary | ICD-10-CM | POA: Diagnosis not present

## 2017-03-29 DIAGNOSIS — B9562 Methicillin resistant Staphylococcus aureus infection as the cause of diseases classified elsewhere: Secondary | ICD-10-CM | POA: Diagnosis not present

## 2017-03-29 DIAGNOSIS — T8743 Infection of amputation stump, right lower extremity: Secondary | ICD-10-CM | POA: Diagnosis not present

## 2017-03-29 DIAGNOSIS — D509 Iron deficiency anemia, unspecified: Secondary | ICD-10-CM | POA: Diagnosis not present

## 2017-03-29 DIAGNOSIS — N186 End stage renal disease: Secondary | ICD-10-CM | POA: Diagnosis not present

## 2017-03-29 DIAGNOSIS — N2581 Secondary hyperparathyroidism of renal origin: Secondary | ICD-10-CM | POA: Diagnosis not present

## 2017-03-29 DIAGNOSIS — D631 Anemia in chronic kidney disease: Secondary | ICD-10-CM | POA: Diagnosis not present

## 2017-03-30 ENCOUNTER — Ambulatory Visit (INDEPENDENT_AMBULATORY_CARE_PROVIDER_SITE_OTHER): Payer: Medicare Other | Admitting: Internal Medicine

## 2017-03-30 ENCOUNTER — Encounter: Payer: Self-pay | Admitting: Internal Medicine

## 2017-03-30 DIAGNOSIS — I779 Disorder of arteries and arterioles, unspecified: Secondary | ICD-10-CM | POA: Diagnosis not present

## 2017-03-30 DIAGNOSIS — R7881 Bacteremia: Secondary | ICD-10-CM

## 2017-03-30 DIAGNOSIS — I96 Gangrene, not elsewhere classified: Secondary | ICD-10-CM

## 2017-03-30 NOTE — Progress Notes (Signed)
   Subjective:    Patient ID: Jacob [name], male    DOB: 1950/10/19, 66 y.o.   MRN: [id number]  HPI Here for follow up of MRSA bacteremia He had a transmetatarsal amputation and non healing surgical site and developed MRSA bacteremia.  Initial recommendation was for BKA which he initially refused and later agreed and here for follow up.  He was getting vancomcin via dialysis through 8/25.  No associated rash, diarrhea.  No fever or chills.    Review of Systems  Constitutional: Negative for fatigue.  Neurological: Negative for dizziness.       Objective:   Physical Exam  Constitutional: He appears well-developed and well-nourished. No distress.  Eyes: No scleral icterus.  Cardiovascular: Normal rate, regular rhythm and normal heart sounds.   No murmur heard. Pulmonary/Chest: Effort normal and breath sounds normal. No respiratory distress.  Skin: No rash noted.          Assessment & Plan:

## 2017-03-30 NOTE — Assessment & Plan Note (Signed)
This has resolved with negative repeat blood cultures during the hospitalization

## 2017-03-30 NOTE — Assessment & Plan Note (Signed)
Now amputated, source control.

## 2017-03-31 DIAGNOSIS — N186 End stage renal disease: Secondary | ICD-10-CM | POA: Diagnosis not present

## 2017-03-31 DIAGNOSIS — D509 Iron deficiency anemia, unspecified: Secondary | ICD-10-CM | POA: Diagnosis not present

## 2017-03-31 DIAGNOSIS — T8743 Infection of amputation stump, right lower extremity: Secondary | ICD-10-CM | POA: Diagnosis not present

## 2017-03-31 DIAGNOSIS — B9562 Methicillin resistant Staphylococcus aureus infection as the cause of diseases classified elsewhere: Secondary | ICD-10-CM | POA: Diagnosis not present

## 2017-03-31 DIAGNOSIS — D631 Anemia in chronic kidney disease: Secondary | ICD-10-CM | POA: Diagnosis not present

## 2017-03-31 DIAGNOSIS — N2581 Secondary hyperparathyroidism of renal origin: Secondary | ICD-10-CM | POA: Diagnosis not present

## 2017-04-01 ENCOUNTER — Ambulatory Visit (INDEPENDENT_AMBULATORY_CARE_PROVIDER_SITE_OTHER): Payer: Self-pay | Admitting: Family

## 2017-04-01 ENCOUNTER — Encounter: Payer: Self-pay | Admitting: Family

## 2017-04-01 ENCOUNTER — Other Ambulatory Visit: Payer: Self-pay

## 2017-04-01 VITALS — BP 163/82 | HR 64 | Temp 97.2°F | Resp 17 | Ht 74.0 in | Wt 273.0 lb

## 2017-04-01 DIAGNOSIS — Z992 Dependence on renal dialysis: Secondary | ICD-10-CM

## 2017-04-01 DIAGNOSIS — N186 End stage renal disease: Secondary | ICD-10-CM | POA: Diagnosis not present

## 2017-04-01 DIAGNOSIS — Z89511 Acquired absence of right leg below knee: Secondary | ICD-10-CM

## 2017-04-01 DIAGNOSIS — I779 Disorder of arteries and arterioles, unspecified: Secondary | ICD-10-CM

## 2017-04-01 DIAGNOSIS — R040 Epistaxis: Secondary | ICD-10-CM | POA: Diagnosis not present

## 2017-04-01 DIAGNOSIS — L97521 Non-pressure chronic ulcer of other part of left foot limited to breakdown of skin: Secondary | ICD-10-CM

## 2017-04-01 DIAGNOSIS — Z4781 Encounter for orthopedic aftercare following surgical amputation: Secondary | ICD-10-CM | POA: Diagnosis not present

## 2017-04-01 NOTE — Patient Instructions (Signed)

## 2017-04-01 NOTE — Progress Notes (Signed)
[facility] [place]   CC: Staples removal from right BKA  History of Present Illness Jacob [name] is a 66 y.o. male who is s/p right BKA on 02-28-17 by Dr. Donnetta Hutching for nonhealing right transmetatarsal amputation with MRSA sepsis.  Dr. Trula Slade, Dr. Scot Dock, Dr. Bridgett Larsson, and Dr. Donzetta Matters have also been involved in his care and creation of HD access and PAOD.   He has had several previous HD access in his arms.   He denies any steal symptoms. He is hard of hearing.  He denies fever or chills He is a resident of [facility] center.   He dialyzes M-W-F via right upper arm AVF.  His A1C on 12-28-16 was 7.7. He has never used tobacco.    Past Medical History:  Diagnosis Date  . Anemia   . Anemia in chronic kidney disease(285.21)   . Anxiety   . Arthritis   . Depression   . Dyspnea    with exertion  . ESRD (end stage renal disease) on dialysis [facility])    "MWF; [facility] [place]" (02/18/2017)  . Essential hypertension   . GERD (gastroesophageal reflux disease)   . Glaucoma   . History of blood transfusion   . Insomnia   . Nonischemic cardiomyopathy ([place])   . Stroke [facility]) 2016   - "they said it was not a stroke"  . Type 2 diabetes mellitus ([place])    Type II    Social History Social History  Substance Use Topics  . Smoking status: Never Smoker  . Smokeless tobacco: Never Used  . Alcohol use Yes     Comment: 1- fifth of gin a week    Family History Family History  Problem Relation Age of Onset  . Heart attack Brother        24  . Heart disease Brother        before age 93  . Hypertension Brother   . Heart attack Brother        73  . Heart attack Brother        107  . Diabetes Mother   . Hypertension Mother   . Heart disease Mother   . Heart disease Father   . Hypertension Father   . Other Father        amputation  . Hypertension Sister   . Heart disease Sister   . Vision loss Maternal Uncle     Past Surgical History:  Procedure Laterality  Date  . A/V FISTULAGRAM Right 10/06/2016   Procedure: A/V Fistulagram;  Surgeon: [name], MD;  Location: [place] [facility];  Service: Cardiovascular;  Laterality: Right;  . ABDOMINAL AORTOGRAM N/A 12/30/2016   Procedure: Abdominal Aortogram;  Surgeon: Waynetta Sandy, MD;  Location: [place] [facility];  Service: Cardiovascular;  Laterality: N/A;  . ABDOMINAL AORTOGRAM W/LOWER EXTREMITY N/A 02/23/2017   Procedure: Abdominal Aortogram w/Lower Extremity;  Surgeon: [name], MD;  Location: [place] [facility];  Service: Cardiovascular;  Laterality: N/A;  Rt. leg  . AMPUTATION Right 02/28/2017   Procedure: RIGHT BELOW KNEE AMPUTATION;  Surgeon: [name], MD;  Location: [place];  Service: Vascular;  Laterality: Right;  . AV FISTULA PLACEMENT     Hx: of  . AV FISTULA PLACEMENT Right 05/04/2013   Procedure: ARTERIOVENOUS (AV) FISTULA CREATION- RIGHT ARM;  Surgeon: Conrad [place], MD;  Location: [place];  Service: Vascular;  Laterality: Right;  Ultrasound guided  . AV FISTULA PLACEMENT  Right 07/28/2016   Procedure: BRACHIOCEPHALIC ARTERIOVENOUS (AV) FISTULA CREATION;  Surgeon: [name], MD;  Location: [place];  Service: Vascular;  Laterality: Right;  . CATARACT EXTRACTION W/PHACO Left 07/16/2014   Procedure: CATARACT EXTRACTION PHACO AND INTRAOCULAR LENS PLACEMENT (IOC);  Surgeon: [name], MD;  Location: [facility];  Service: Ophthalmology;  Laterality: Left;  CDE:8.86  . CATARACT EXTRACTION W/PHACO Right 07/30/2014   Procedure: CATARACT EXTRACTION PHACO AND INTRAOCULAR LENS PLACEMENT (IOC);  Surgeon: [name], MD;  Location: [facility];  Service: Ophthalmology;  Laterality: Right;  CDE 8.99  . COLONOSCOPY     Hx: of  . FISTULA SUPERFICIALIZATION Right 07/28/2016   Procedure: FISTULA SUPERFICIALIZATION;  Surgeon: [name], MD;  Location: [place];  Service: Vascular;  Laterality: Right;  . FISTULA SUPERFICIALIZATION Right 10/13/2016   Procedure: BRACHIOCEPHALIC  ARTERIOVENOUS FISTULA SUPERFICIALIZATION;  Surgeon: [name], MD;  Location: [place];  Service: Vascular;  Laterality: Right;  . FISTULOGRAM N/A 05/04/2013   Procedure: CENTRAL VENOGRAM;  Surgeon: Conrad Wilkeson, MD;  Location: [place];  Service: Vascular;  Laterality: N/A;  . INSERTION OF DIALYSIS CATHETER Right 05/04/2013   Procedure: INSERTION OF DIALYSIS CATHETER;  Surgeon: Conrad [place], MD;  Location: [place];  Service: Vascular;  Laterality: Right;  Ultrasound guided  . INSERTION OF DIALYSIS CATHETER N/A 07/28/2016   Procedure: INSERTION OF DIALYSIS CATHETER;  Surgeon: [name], MD;  Location: [place];  Service: Vascular;  Laterality: N/A;  . LIGATION OF ARTERIOVENOUS  FISTULA Left 05/04/2013   Procedure: LIGATION OF LEFT RADIAL CEPHALIC ARTERIOVENOUS  FISTULA;  Surgeon: Conrad [place], MD;  Location: [place];  Service: Vascular;  Laterality: Left;  Ultrasound guided  . LIGATION OF ARTERIOVENOUS  FISTULA Right 07/28/2016   Procedure: LIGATION OF RADIOCEPHALIC ARTERIOVENOUS  FISTULA;  Surgeon: [name], MD;  Location: [place];  Service: Vascular;  Laterality: Right;  . LOWER EXTREMITY ANGIOGRAPHY N/A 12/30/2016   Procedure: Lower Extremity Angiography;  Surgeon: Waynetta Sandy, MD;  Location: [place] [facility];  Service: Cardiovascular;  Laterality: N/A;  . LUMBAR SPINE SURGERY    . PERIPHERAL VASCULAR ATHERECTOMY Right 12/30/2016   Procedure: Peripheral Vascular Atherectomy;  Surgeon: Waynetta Sandy, MD;  Location: [place] [facility];  Service: Cardiovascular;  Laterality: Right;  AT and PT  . PERIPHERAL VASCULAR BALLOON ANGIOPLASTY Right 12/30/2016   Procedure: Peripheral Vascular Balloon Angioplasty;  Surgeon: Waynetta Sandy, MD;  Location: [place] [facility];  Service: Cardiovascular;  Laterality: Right;  PTA of PT and AT  . PERIPHERAL VASCULAR BALLOON ANGIOPLASTY  02/23/2017   Procedure: Peripheral Vascular Balloon Angioplasty;  Surgeon:  [name], MD;  Location: [place] [facility];  Service: Cardiovascular;;  RT. Anterior Tib.  [name] PERIPHERAL VASCULAR CATHETERIZATION N/A 01/24/2015   Procedure: Fistulagram;  Surgeon: Conrad Harvey, MD;  Location: [place] [facility];  Service: Cardiovascular;  Laterality: N/A;  . PERIPHERAL VASCULAR CATHETERIZATION Right 06/04/2016   Procedure: A/V Shuntogram/Fistulagram;  Surgeon: Conrad [place]mer, MD;  Location: [place] [facility];  Service: Cardiovascular;  Laterality: Right;  . REVISON OF ARTERIOVENOUS FISTULA Right 01/02/2014   Procedure: REVISON OF ARTERIOVENOUS FISTULA ANASTOMOSIS;  Surgeon: Conrad , MD;  Location: [place];  Service: Vascular;  Laterality: Right;  . REVISON OF ARTERIOVENOUS FISTULA Right 01/02/2016   Procedure: REVISION OF RADIOCEPHALIC ARTERIOVENOUS FISTULA  with BOVINE PATCH ANGIOPLASTY RIGHT RADIAL ARTERY;  Surgeon: Mal [name], MD;  Location: [place];  Service: Vascular;  Laterality: Right;  . SHUNTOGRAM N/A 11/07/2013   Procedure: Fistulogram;  Surgeon: [name], MD;  Location: [facility] C[facility];  Service: Cardiovascular;  Laterality: N/A;  . TRANSMETATARSAL AMPUTATION Right 01/01/2017   Procedure: TRANSMETATARSAL AMPUTATION;  Surgeon: [name], MD;  Location: [place];  Service: Vascular;  Laterality: Right;    Allergies  Allergen Reactions  . Bee Venom Anaphylaxis  . Iodine Swelling  . Penicillins Swelling and Other (See Comments)    SWELLING REACTION UNSPECIFIED   Has patient had a PCN reaction causing immediate rash, facial/tongue/throat swelling, SOB or lightheadedness with hypotension: No Has patient had a PCN reaction causing severe rash involving mucus membranes or skin necrosis: No Has patient had a PCN reaction that required hospitalization No Has patient had a PCN reaction occurring within the last 10 years: No If all of the above answers are "NO", then may proceed with Cephalosporin use.  . Sulfadiazine Other (See Comments)    1% Silver  Sulfadiazine cream causes burning over a large area of skin.    Current Outpatient Prescriptions  Medication Sig Dispense Refill  . acetaminophen (TYLENOL) 500 MG tablet Take 1,000 mg by mouth every 6 (six) hours as needed for mild pain.    [name] aspirin EC 81 MG tablet Take 1 tablet (81 mg total) by mouth daily. 90 tablet 0  . atorvastatin (LIPITOR) 40 MG tablet Take 1 tablet (40 mg total) by mouth daily at 6 PM. 30 tablet 0  . B Complex-C-Folic Acid (DIALYVITE TABLET) TABS Take 1 tablet by mouth every Monday, Wednesday, and Friday.     . bisacodyl (DULCOLAX) 5 MG EC tablet Take 2 tablets (10 mg total) by mouth daily as needed for moderate constipation. 30 tablet 0  . carvedilol (COREG) 12.5 MG tablet Take 1 tablet (12.5 mg total) by mouth 2 (two) times daily with a meal. 2 times per day on Sun, Tu, Thur, and Sat.  Hold on dialysis days. 60 tablet 0  . cinacalcet (SENSIPAR) 30 MG tablet Take 30 mg by mouth daily.    [name] EPIPEN 2-PAK 0.3 MG/0.3ML SOAJ injection Inject 0.3 mg into the muscle once as needed (For anaphylaxis.).     [name] finasteride (PROSCAR) 5 MG tablet Take 1 tablet (5 mg total) by mouth daily. 30 tablet 0  . HYDROcodone-acetaminophen (NORCO/VICODIN) 5-325 MG tablet Take 1 tablet by mouth every 4 (four) hours as needed for moderate pain. 10 tablet 0  . LANTUS SOLOSTAR 100 UNIT/ML Solostar Pen Inject 15 Units into the skin at bedtime. 15 mL 0  . omeprazole (PRILOSEC) 40 MG capsule Take 40 mg by mouth daily.     . polyethylene glycol powder (GLYCOLAX/MIRALAX) powder Take 17 g by mouth daily. 255 g 0  . RENVELA 800 MG tablet Take 800-2,400 mg by mouth 3 (three) times daily with meals. Take 2400 mg by mouth 3 times daily with meals and 800 mg by mouth with snacks    . senna (SENOKOT) 8.6 MG TABS tablet Take 2 tablets (17.2 mg total) by mouth at bedtime. 120 each 0  . vancomycin (VANCOCIN) 1-5 GM/200ML-% SOLN Inject 200 mLs (1,000 mg total) into the vein every Monday, Wednesday, and Friday with  hemodialysis. 4000 mL 0  . clopidogrel (PLAVIX) 75 MG tablet Take 1 tablet (75 mg total) by mouth daily with breakfast. (Patient not taking: Reported on 04/01/2017) 30 tablet 0   No current facility-administered medications for this visit.     ROS: See  HPI for pertinent positives and negatives.   Physical Examination  Vitals:   04/01/17 1015  BP: (!) 163/82  Pulse: 64  Resp: 17  Temp: (!) 97.2 F (36.2 C)  TempSrc: Oral  SpO2: 96%  Weight: 273 lb (123.8 kg)  Height: 6\' 2"  (1.88 m)   Body mass index is 35.05 kg/m.  General:The patient appears their stated age, obese male seated in w/c.   HEENT:  No gross abnormalities Pulmonary: Respirations are non-labored Abdomen: Softly obese and non-tender. Musculoskeletal: Right BKA stump with bulky dressing.  Neurologic: No focal weakness or paresthesias are detected, is hard of hearing.  Skin: There are no ulcer or rashes noted. Psychiatric: The patient has normal affect. Cardiovascular: There is a regular rate and rhythm without significant murmur appreciated. The right upper arm AV fistula has a palpable thrill. Bilateral radial pulses are palpable.     Right BKA before staples removed   Left heel ulcer    Left great toe ulcer    ASSESSMENT: Jacob [name] is a 66 y.o. male who is s/p right BKA on 02-28-17 by Dr. Donnetta Hutching for nonhealing right transmetatarsal amputation with MRSA sepsis.  Dr. Trula Slade, Dr. Scot Dock, Dr. Bridgett Larsson, and Dr. Donzetta Matters have also been involved in his care and creation of HD access and PAOD.  Dr. Donzetta Matters spoke with and examined pt. Left heel and medial aspect great toe ulcer. Shiny appearance of left calf.   Well healed right BKA incision. All sutures removed and steri strips applied.    PLAN:  Based on the patient's previous vascular studies and examination, pt will be scheduled for an arteriogram, possible intervention left leg, by Dr. Bridgett Larsson on 04-08-17.  I discussed in depth with the patient the nature of  atherosclerosis, and emphasized the importance of maximal medical management including strict control of blood pressure, blood glucose, and lipid levels, obtaining regular exercise, and continued cessation of smoking.  The patient is aware that without maximal medical management the underlying atherosclerotic disease process will progress, limiting the benefit of any interventions.  The patient was given information about PAD including signs, symptoms, treatment, what symptoms should prompt the patient to seek immediate medical care, and risk reduction measures to take.  Clemon Chambers, RN, MSN, FNP-C [facility] [employer] Phone: [phone]  Clinic MD: Donzetta Matters  04/01/17 10:32 AM

## 2017-04-02 DIAGNOSIS — D631 Anemia in chronic kidney disease: Secondary | ICD-10-CM | POA: Diagnosis not present

## 2017-04-02 DIAGNOSIS — B9562 Methicillin resistant Staphylococcus aureus infection as the cause of diseases classified elsewhere: Secondary | ICD-10-CM | POA: Diagnosis not present

## 2017-04-02 DIAGNOSIS — T8743 Infection of amputation stump, right lower extremity: Secondary | ICD-10-CM | POA: Diagnosis not present

## 2017-04-02 DIAGNOSIS — N186 End stage renal disease: Secondary | ICD-10-CM | POA: Diagnosis not present

## 2017-04-02 DIAGNOSIS — N2581 Secondary hyperparathyroidism of renal origin: Secondary | ICD-10-CM | POA: Diagnosis not present

## 2017-04-02 DIAGNOSIS — D509 Iron deficiency anemia, unspecified: Secondary | ICD-10-CM | POA: Diagnosis not present

## 2017-04-05 DIAGNOSIS — Z992 Dependence on renal dialysis: Secondary | ICD-10-CM | POA: Diagnosis not present

## 2017-04-05 DIAGNOSIS — N186 End stage renal disease: Secondary | ICD-10-CM | POA: Diagnosis not present

## 2017-04-05 DIAGNOSIS — D509 Iron deficiency anemia, unspecified: Secondary | ICD-10-CM | POA: Diagnosis not present

## 2017-04-05 DIAGNOSIS — N2581 Secondary hyperparathyroidism of renal origin: Secondary | ICD-10-CM | POA: Diagnosis not present

## 2017-04-05 DIAGNOSIS — D631 Anemia in chronic kidney disease: Secondary | ICD-10-CM | POA: Diagnosis not present

## 2017-04-05 DIAGNOSIS — Z23 Encounter for immunization: Secondary | ICD-10-CM | POA: Diagnosis not present

## 2017-04-07 ENCOUNTER — Other Ambulatory Visit: Payer: Self-pay | Admitting: Licensed Clinical Social Worker

## 2017-04-07 DIAGNOSIS — D509 Iron deficiency anemia, unspecified: Secondary | ICD-10-CM | POA: Diagnosis not present

## 2017-04-07 DIAGNOSIS — N186 End stage renal disease: Secondary | ICD-10-CM | POA: Diagnosis not present

## 2017-04-07 DIAGNOSIS — N2581 Secondary hyperparathyroidism of renal origin: Secondary | ICD-10-CM | POA: Diagnosis not present

## 2017-04-07 DIAGNOSIS — Z23 Encounter for immunization: Secondary | ICD-10-CM | POA: Diagnosis not present

## 2017-04-07 DIAGNOSIS — Z992 Dependence on renal dialysis: Secondary | ICD-10-CM | POA: Diagnosis not present

## 2017-04-07 DIAGNOSIS — D631 Anemia in chronic kidney disease: Secondary | ICD-10-CM | POA: Diagnosis not present

## 2017-04-07 NOTE — Patient Outreach (Signed)
Assessment:  CSW spoke via phone with client. CSW verified client identity. CSW and client spoke of client needs.Client is receiving nursing care and physical therapy support at [facility] facility in [place].  Client said that physical therapy sessions were helpful to client.  Client has support from his sister.  Client did not mention any pain issues. Client receives dialysis 3 times weekly as scheduled.  Client said he is eating adequately and sleeping adequately.  CSW and client discussed client care plan. CSW encouraged client to participate in all scheduled client physical therapy sessions for client in next 30 days at nursing facility. Client is using a wheelchair at present to assist client with ambulation. Client said he is taking medications as prescribed. Client said he has scheduled appointment on 04/15/17 related to information on prosthetic device for client. Client is hoping to receive physical therapy sessions as scheduled for client at facility. Client hopes to return home, when able, with needed supports in place.  CSW encouraged client to call CSW at [phone] as needed to discuss social work needs of client. Client was appreciative of phone call from [place] on 04/07/17.    Plan:  Client to participate in all scheduled client physical therapy  sessions  for client in next 30 days at nursing facility.   CSW to call client in 3 weeks to assess client needs at that time.  Norva Riffle.Lareen Mullings MSW, LCSW Licensed Clinical Social Worker [facility] Care Management [phone]

## 2017-04-08 ENCOUNTER — Encounter (HOSPITAL_COMMUNITY)
Admission: RE | Disposition: A | Discharge status: Home / Self Care | Payer: Self-pay | Source: Ambulatory Visit | Attending: Vascular Surgery

## 2017-04-08 ENCOUNTER — Ambulatory Visit (HOSPITAL_COMMUNITY)
Admission: RE | Admit: 2017-04-08 | Discharge: 2017-04-08 | Disposition: A | Discharge status: Home / Self Care | Payer: Medicare Other | Source: Ambulatory Visit | Attending: Vascular Surgery | Admitting: Vascular Surgery

## 2017-04-08 ENCOUNTER — Encounter (HOSPITAL_COMMUNITY): Payer: Self-pay | Admitting: Vascular Surgery

## 2017-04-08 DIAGNOSIS — G4700 Insomnia, unspecified: Secondary | ICD-10-CM | POA: Insufficient documentation

## 2017-04-08 DIAGNOSIS — L7632 Postprocedural hematoma of skin and subcutaneous tissue following other procedure: Secondary | ICD-10-CM | POA: Diagnosis not present

## 2017-04-08 DIAGNOSIS — E1122 Type 2 diabetes mellitus with diabetic chronic kidney disease: Secondary | ICD-10-CM | POA: Diagnosis present

## 2017-04-08 DIAGNOSIS — I428 Other cardiomyopathies: Secondary | ICD-10-CM | POA: Insufficient documentation

## 2017-04-08 DIAGNOSIS — Z882 Allergy status to sulfonamides status: Secondary | ICD-10-CM

## 2017-04-08 DIAGNOSIS — Z7982 Long term (current) use of aspirin: Secondary | ICD-10-CM | POA: Insufficient documentation

## 2017-04-08 DIAGNOSIS — E039 Hypothyroidism, unspecified: Secondary | ICD-10-CM | POA: Diagnosis present

## 2017-04-08 DIAGNOSIS — L97529 Non-pressure chronic ulcer of other part of left foot with unspecified severity: Secondary | ICD-10-CM

## 2017-04-08 DIAGNOSIS — Z992 Dependence on renal dialysis: Secondary | ICD-10-CM

## 2017-04-08 DIAGNOSIS — Z8249 Family history of ischemic heart disease and other diseases of the circulatory system: Secondary | ICD-10-CM | POA: Insufficient documentation

## 2017-04-08 DIAGNOSIS — E875 Hyperkalemia: Secondary | ICD-10-CM | POA: Diagnosis present

## 2017-04-08 DIAGNOSIS — D62 Acute posthemorrhagic anemia: Secondary | ICD-10-CM | POA: Diagnosis not present

## 2017-04-08 DIAGNOSIS — F329 Major depressive disorder, single episode, unspecified: Secondary | ICD-10-CM

## 2017-04-08 DIAGNOSIS — R0602 Shortness of breath: Secondary | ICD-10-CM | POA: Diagnosis not present

## 2017-04-08 DIAGNOSIS — Z9841 Cataract extraction status, right eye: Secondary | ICD-10-CM

## 2017-04-08 DIAGNOSIS — M7989 Other specified soft tissue disorders: Secondary | ICD-10-CM | POA: Diagnosis not present

## 2017-04-08 DIAGNOSIS — Z794 Long term (current) use of insulin: Secondary | ICD-10-CM

## 2017-04-08 DIAGNOSIS — N2581 Secondary hyperparathyroidism of renal origin: Secondary | ICD-10-CM | POA: Diagnosis present

## 2017-04-08 DIAGNOSIS — D631 Anemia in chronic kidney disease: Secondary | ICD-10-CM | POA: Diagnosis present

## 2017-04-08 DIAGNOSIS — E1151 Type 2 diabetes mellitus with diabetic peripheral angiopathy without gangrene: Secondary | ICD-10-CM | POA: Diagnosis present

## 2017-04-08 DIAGNOSIS — R4781 Slurred speech: Secondary | ICD-10-CM | POA: Diagnosis present

## 2017-04-08 DIAGNOSIS — Z9862 Peripheral vascular angioplasty status: Secondary | ICD-10-CM

## 2017-04-08 DIAGNOSIS — Z7902 Long term (current) use of antithrombotics/antiplatelets: Secondary | ICD-10-CM

## 2017-04-08 DIAGNOSIS — R109 Unspecified abdominal pain: Secondary | ICD-10-CM | POA: Diagnosis not present

## 2017-04-08 DIAGNOSIS — I429 Cardiomyopathy, unspecified: Secondary | ICD-10-CM | POA: Diagnosis not present

## 2017-04-08 DIAGNOSIS — H409 Unspecified glaucoma: Secondary | ICD-10-CM

## 2017-04-08 DIAGNOSIS — Z89511 Acquired absence of right leg below knee: Secondary | ICD-10-CM | POA: Insufficient documentation

## 2017-04-08 DIAGNOSIS — K219 Gastro-esophageal reflux disease without esophagitis: Secondary | ICD-10-CM | POA: Diagnosis present

## 2017-04-08 DIAGNOSIS — F419 Anxiety disorder, unspecified: Secondary | ICD-10-CM | POA: Diagnosis present

## 2017-04-08 DIAGNOSIS — L97429 Non-pressure chronic ulcer of left heel and midfoot with unspecified severity: Secondary | ICD-10-CM

## 2017-04-08 DIAGNOSIS — Z79891 Long term (current) use of opiate analgesic: Secondary | ICD-10-CM

## 2017-04-08 DIAGNOSIS — I70244 Atherosclerosis of native arteries of left leg with ulceration of heel and midfoot: Secondary | ICD-10-CM | POA: Insufficient documentation

## 2017-04-08 DIAGNOSIS — N400 Enlarged prostate without lower urinary tract symptoms: Secondary | ICD-10-CM | POA: Diagnosis present

## 2017-04-08 DIAGNOSIS — Y838 Other surgical procedures as the cause of abnormal reaction of the patient, or of later complication, without mention of misadventure at the time of the procedure: Secondary | ICD-10-CM | POA: Diagnosis present

## 2017-04-08 DIAGNOSIS — Z880 Allergy status to penicillin: Secondary | ICD-10-CM

## 2017-04-08 DIAGNOSIS — N186 End stage renal disease: Secondary | ICD-10-CM | POA: Insufficient documentation

## 2017-04-08 DIAGNOSIS — Z9842 Cataract extraction status, left eye: Secondary | ICD-10-CM

## 2017-04-08 DIAGNOSIS — M1990 Unspecified osteoarthritis, unspecified site: Secondary | ICD-10-CM

## 2017-04-08 DIAGNOSIS — K5900 Constipation, unspecified: Secondary | ICD-10-CM | POA: Diagnosis present

## 2017-04-08 DIAGNOSIS — I120 Hypertensive chronic kidney disease with stage 5 chronic kidney disease or end stage renal disease: Secondary | ICD-10-CM

## 2017-04-08 DIAGNOSIS — E11621 Type 2 diabetes mellitus with foot ulcer: Secondary | ICD-10-CM | POA: Diagnosis present

## 2017-04-08 DIAGNOSIS — Z833 Family history of diabetes mellitus: Secondary | ICD-10-CM

## 2017-04-08 DIAGNOSIS — I70245 Atherosclerosis of native arteries of left leg with ulceration of other part of foot: Secondary | ICD-10-CM | POA: Insufficient documentation

## 2017-04-08 DIAGNOSIS — Z8673 Personal history of transient ischemic attack (TIA), and cerebral infarction without residual deficits: Secondary | ICD-10-CM | POA: Insufficient documentation

## 2017-04-08 DIAGNOSIS — Z91048 Other nonmedicinal substance allergy status: Secondary | ICD-10-CM

## 2017-04-08 DIAGNOSIS — I739 Peripheral vascular disease, unspecified: Secondary | ICD-10-CM | POA: Diagnosis present

## 2017-04-08 DIAGNOSIS — Z888 Allergy status to other drugs, medicaments and biological substances status: Secondary | ICD-10-CM

## 2017-04-08 DIAGNOSIS — Z79899 Other long term (current) drug therapy: Secondary | ICD-10-CM

## 2017-04-08 DIAGNOSIS — L97509 Non-pressure chronic ulcer of other part of unspecified foot with unspecified severity: Secondary | ICD-10-CM | POA: Diagnosis present

## 2017-04-08 DIAGNOSIS — Z961 Presence of intraocular lens: Secondary | ICD-10-CM | POA: Diagnosis present

## 2017-04-08 DIAGNOSIS — Z91030 Bee allergy status: Secondary | ICD-10-CM

## 2017-04-08 DIAGNOSIS — Z89431 Acquired absence of right foot: Secondary | ICD-10-CM

## 2017-04-08 DIAGNOSIS — I70202 Unspecified atherosclerosis of native arteries of extremities, left leg: Secondary | ICD-10-CM | POA: Diagnosis present

## 2017-04-08 DIAGNOSIS — Z821 Family history of blindness and visual loss: Secondary | ICD-10-CM

## 2017-04-08 DIAGNOSIS — R001 Bradycardia, unspecified: Secondary | ICD-10-CM | POA: Diagnosis present

## 2017-04-08 HISTORY — PX: PERIPHERAL VASCULAR BALLOON ANGIOPLASTY: CATH118281

## 2017-04-08 HISTORY — PX: ABDOMINAL AORTOGRAM: CATH118222

## 2017-04-08 LAB — POCT I-STAT, CHEM 8
BUN: 71 mg/dL — AB (ref 6–20)
CALCIUM ION: 1.08 mmol/L — AB (ref 1.15–1.40)
CHLORIDE: 98 mmol/L — AB (ref 101–111)
CREATININE: 11 mg/dL — AB (ref 0.61–1.24)
GLUCOSE: 153 mg/dL — AB (ref 65–99)
HCT: 28 % — ABNORMAL LOW (ref 39.0–52.0)
Hemoglobin: 9.5 g/dL — ABNORMAL LOW (ref 13.0–17.0)
Potassium: 5.4 mmol/L — ABNORMAL HIGH (ref 3.5–5.1)
Sodium: 136 mmol/L (ref 135–145)
TCO2: 28 mmol/L (ref 22–32)

## 2017-04-08 LAB — POCT ACTIVATED CLOTTING TIME
ACTIVATED CLOTTING TIME: 230 s
Activated Clotting Time: 252 seconds
Activated Clotting Time: 208 seconds
Activated Clotting Time: 191 seconds

## 2017-04-08 LAB — GLUCOSE, CAPILLARY
GLUCOSE-CAPILLARY: 161 mg/dL — AB (ref 65–99)
Glucose-Capillary: 194 mg/dL — ABNORMAL HIGH (ref 65–99)

## 2017-04-08 SURGERY — ABDOMINAL AORTOGRAM
Anesthesia: LOCAL

## 2017-04-08 MED ORDER — SODIUM CHLORIDE 0.9% FLUSH: 3.0000 mL | INTRAVENOUS | Status: DC | PRN

## 2017-04-08 MED ORDER — HYDROCODONE-ACETAMINOPHEN 5-325 MG PO TABS
1.0000 | ORAL_TABLET | Freq: Once | ORAL | Status: AC
  Administered 2017-04-08: 1 via ORAL

## 2017-04-08 MED ORDER — DIPHENHYDRAMINE HCL 50 MG/ML IJ SOLN
INTRAMUSCULAR | Status: AC
  Administered 2017-04-08: 25 mg via INTRAVENOUS
  Filled 2017-04-08: qty 1

## 2017-04-08 MED ORDER — FENTANYL CITRATE (PF) 100 MCG/2ML IJ SOLN
INTRAMUSCULAR | Status: DC | PRN
  Administered 2017-04-08: 50 ug via INTRAVENOUS

## 2017-04-08 MED ORDER — HYDROCODONE-ACETAMINOPHEN 5-325 MG PO TABS
ORAL_TABLET | ORAL | Status: AC
  Administered 2017-04-08: 1 via ORAL
  Filled 2017-04-08: qty 1

## 2017-04-08 MED ORDER — MIDAZOLAM HCL 2 MG/2ML IJ SOLN
INTRAMUSCULAR | Status: AC
  Filled 2017-04-08: qty 2

## 2017-04-08 MED ORDER — LIDOCAINE HCL (PF) 1 % IJ SOLN
INTRAMUSCULAR | Status: DC | PRN
  Administered 2017-04-08: 25 mL

## 2017-04-08 MED ORDER — SODIUM CHLORIDE 0.9% FLUSH: 3.0000 mL | Freq: Two times a day (BID) | INTRAVENOUS | Status: DC

## 2017-04-08 MED ORDER — HYDRALAZINE HCL 20 MG/ML IJ SOLN
INTRAMUSCULAR | Status: AC
  Filled 2017-04-08: qty 1

## 2017-04-08 MED ORDER — ASPIRIN EC 81 MG PO TBEC
81.0000 mg | DELAYED_RELEASE_TABLET | Freq: Every day | ORAL | Status: DC
  Filled 2017-04-08: qty 1

## 2017-04-08 MED ORDER — SODIUM CHLORIDE 0.9 % IV SOLN: 250.0000 mL | INTRAVENOUS | Status: DC | PRN

## 2017-04-08 MED ORDER — HEPARIN SODIUM (PORCINE) 1000 UNIT/ML IJ SOLN
INTRAMUSCULAR | Status: AC
  Filled 2017-04-08: qty 1

## 2017-04-08 MED ORDER — METHYLPREDNISOLONE SODIUM SUCC 125 MG IJ SOLR
125.0000 mg | INTRAMUSCULAR | Status: AC
  Administered 2017-04-08: 125 mg via INTRAVENOUS

## 2017-04-08 MED ORDER — ASPIRIN 81 MG PO TABS: 81.0000 mg | ORAL_TABLET | Freq: Every day | ORAL | 3 refills | Status: DC

## 2017-04-08 MED ORDER — HEPARIN (PORCINE) IN NACL 2-0.9 UNIT/ML-% IJ SOLN
INTRAMUSCULAR | Status: AC | PRN
  Administered 2017-04-08: 1000 mL

## 2017-04-08 MED ORDER — DIPHENHYDRAMINE HCL 50 MG/ML IJ SOLN
25.0000 mg | INTRAMUSCULAR | Status: AC
  Administered 2017-04-08: 25 mg via INTRAVENOUS

## 2017-04-08 MED ORDER — FAMOTIDINE IN NACL 20-0.9 MG/50ML-% IV SOLN
INTRAVENOUS | Status: AC
  Administered 2017-04-08: 20 mg via INTRAVENOUS
  Filled 2017-04-08: qty 50

## 2017-04-08 MED ORDER — HEPARIN SODIUM (PORCINE) 1000 UNIT/ML IJ SOLN
INTRAMUSCULAR | Status: DC | PRN
  Administered 2017-04-08: 13000 [IU] via INTRAVENOUS

## 2017-04-08 MED ORDER — IODIXANOL 320 MG/ML IV SOLN
INTRAVENOUS | Status: DC | PRN
  Administered 2017-04-08: 115 mL via INTRA_ARTERIAL

## 2017-04-08 MED ORDER — FAMOTIDINE IN NACL 20-0.9 MG/50ML-% IV SOLN
20.0000 mg | INTRAVENOUS | Status: AC
  Administered 2017-04-08: 20 mg via INTRAVENOUS

## 2017-04-08 MED ORDER — LABETALOL HCL 5 MG/ML IV SOLN
10.0000 mg | INTRAVENOUS | Status: DC | PRN
  Administered 2017-04-08 (×2): 10 mg via INTRAVENOUS

## 2017-04-08 MED ORDER — FENTANYL CITRATE (PF) 100 MCG/2ML IJ SOLN
INTRAMUSCULAR | Status: AC
  Filled 2017-04-08: qty 2

## 2017-04-08 MED ORDER — LABETALOL HCL 5 MG/ML IV SOLN
INTRAVENOUS | Status: AC
  Filled 2017-04-08: qty 4

## 2017-04-08 MED ORDER — METHYLPREDNISOLONE SODIUM SUCC 125 MG IJ SOLR
INTRAMUSCULAR | Status: AC
  Administered 2017-04-08: 125 mg via INTRAVENOUS
  Filled 2017-04-08: qty 2

## 2017-04-08 MED ORDER — HYDRALAZINE HCL 20 MG/ML IJ SOLN
5.0000 mg | INTRAMUSCULAR | Status: AC | PRN
  Administered 2017-04-08 (×2): 5 mg via INTRAVENOUS

## 2017-04-08 MED ORDER — MIDAZOLAM HCL 2 MG/2ML IJ SOLN
INTRAMUSCULAR | Status: DC | PRN
  Administered 2017-04-08: 0.5 mg via INTRAVENOUS
  Administered 2017-04-08: 1 mg via INTRAVENOUS

## 2017-04-08 MED ORDER — HEPARIN SODIUM (PORCINE) 1000 UNIT/ML IJ SOLN
INTRAMUSCULAR | Status: AC
Start: 2017-04-08 — End: 2017-04-08
  Filled 2017-04-08: qty 1

## 2017-04-08 SURGICAL SUPPLY — 26 items
BALL STERLING OTW 2.5X150X150 (BALLOONS) ×1
BALLN ARMADA 2X20X150 (BALLOONS) ×3
BALLN STERLING OTW 2.5X150X150 (BALLOONS) ×2
BALLOON ARMADA 2X20X150 (BALLOONS) IMPLANT
BALLOON STRLNG OTW 2.5X150X150 (BALLOONS) IMPLANT
CATH CXI SUPP ST 2.6FR 150CM (CATHETERS) ×1 IMPLANT
CATH NAVICROSS ANGLED 135CM (MICROCATHETER) ×1 IMPLANT
CATH OMNI FLUSH 5F 65CM (CATHETERS) ×1 IMPLANT
COVER PRB 48X5XTLSCP FOLD TPE (BAG) IMPLANT
COVER PROBE 5X48 (BAG) ×3
DEVICE CONTINUOUS FLUSH (MISCELLANEOUS) ×1 IMPLANT
DEVICE TORQUE .025-.038 (MISCELLANEOUS) ×1 IMPLANT
GUIDEWIRE ANGLED .035X260CM (WIRE) ×1 IMPLANT
KIT ENCORE 26 ADVANTAGE (KITS) ×1 IMPLANT
KIT MICROINTRODUCER STIFF 5F (SHEATH) ×1 IMPLANT
KIT PV (KITS) ×3 IMPLANT
SHEATH PINNACLE 5F 10CM (SHEATH) ×1 IMPLANT
SHEATH PINNACLE MP 6F 45CM (SHEATH) ×1 IMPLANT
SYR MEDRAD MARK V 150ML (SYRINGE) ×3 IMPLANT
TRANSDUCER W/STOPCOCK (MISCELLANEOUS) ×3 IMPLANT
TRAY PV CATH (CUSTOM PROCEDURE TRAY) ×3 IMPLANT
WIRE APPROACH CTO .014 6G 300C (WIRE) ×1 IMPLANT
WIRE APROACH 12G .014X300CM (WIRE) ×1 IMPLANT
WIRE BENTSON .035X145CM (WIRE) ×1 IMPLANT
WIRE G V18X300CM (WIRE) ×1 IMPLANT
WIRE ROSEN-J .035X260CM (WIRE) ×1 IMPLANT

## 2017-04-08 NOTE — H&P (View-Only) (Signed)
[facility] [place]   CC: Staples removal from right BKA  History of Present Illness Jacob [name] is a 66 y.o. male who is s/p right BKA on 02-28-17 by Dr. Donnetta Hutching for nonhealing right transmetatarsal amputation with MRSA sepsis.  Dr. Trula Slade, Dr. Scot Dock, Dr. Bridgett Larsson, and Dr. Donzetta Matters have also been involved in his care and creation of HD access and PAOD.   He has had several previous HD access in his arms.   He denies any steal symptoms. He is hard of hearing.  He denies fever or chills He is a resident of [facility].   He dialyzes M-W-F via right upper arm AVF.  His A1C on 12-28-16 was 7.7. He has never used tobacco.    Past Medical History:  Diagnosis Date  . Anemia   . Anemia in chronic kidney disease(285.21)   . Anxiety   . Arthritis   . Depression   . Dyspnea    with exertion  . ESRD (end stage renal disease) on dialysis [facility])    "MWF; [facility] [place]" (02/18/2017)  . Essential hypertension   . GERD (gastroesophageal reflux disease)   . Glaucoma   . History of blood transfusion   . Insomnia   . Nonischemic cardiomyopathy ([place])   . Stroke [facility]) 2016   - "they said it was not a stroke"  . Type 2 diabetes mellitus ([place])    Type II    Social History Social History  Substance Use Topics  . Smoking status: Never Smoker  . Smokeless tobacco: Never Used  . Alcohol use Yes     Comment: 1- fifth of gin a week    Family History Family History  Problem Relation Age of Onset  . Heart attack Brother        38  . Heart disease Brother        before age 52  . Hypertension Brother   . Heart attack Brother        6  . Heart attack Brother        79  . Diabetes Mother   . Hypertension Mother   . Heart disease Mother   . Heart disease Father   . Hypertension Father   . Other Father        amputation  . Hypertension Sister   . Heart disease Sister   . Vision loss Maternal Uncle     Past Surgical History:  Procedure Laterality  Date  . A/V FISTULAGRAM Right 10/06/2016   Procedure: A/V Fistulagram;  Surgeon: [name], MD;  Location: [place] [facility];  Service: Cardiovascular;  Laterality: Right;  . ABDOMINAL AORTOGRAM N/A 12/30/2016   Procedure: Abdominal Aortogram;  Surgeon: Waynetta Sandy, MD;  Location: [place] [facility];  Service: Cardiovascular;  Laterality: N/A;  . ABDOMINAL AORTOGRAM W/LOWER EXTREMITY N/A 02/23/2017   Procedure: Abdominal Aortogram w/Lower Extremity;  Surgeon: [name], MD;  Location: [place] [facility];  Service: Cardiovascular;  Laterality: N/A;  Rt. leg  . AMPUTATION Right 02/28/2017   Procedure: RIGHT BELOW KNEE AMPUTATION;  Surgeon: [name], MD;  Location: [place];  Service: Vascular;  Laterality: Right;  . AV FISTULA PLACEMENT     Hx: of  . AV FISTULA PLACEMENT Right 05/04/2013   Procedure: ARTERIOVENOUS (AV) FISTULA CREATION- RIGHT ARM;  Surgeon: Conrad [place], MD;  Location: [place];  Service: Vascular;  Laterality: Right;  Ultrasound guided  . AV FISTULA PLACEMENT  Right 07/28/2016   Procedure: BRACHIOCEPHALIC ARTERIOVENOUS (AV) FISTULA CREATION;  Surgeon: [name], MD;  Location: [place];  Service: Vascular;  Laterality: Right;  . CATARACT EXTRACTION W/PHACO Left 07/16/2014   Procedure: CATARACT EXTRACTION PHACO AND INTRAOCULAR LENS PLACEMENT (IOC);  Surgeon: [name], MD;  Location: [facility];  Service: Ophthalmology;  Laterality: Left;  CDE:8.86  . CATARACT EXTRACTION W/PHACO Right 07/30/2014   Procedure: CATARACT EXTRACTION PHACO AND INTRAOCULAR LENS PLACEMENT (IOC);  Surgeon: [name], MD;  Location: [facility];  Service: Ophthalmology;  Laterality: Right;  CDE 8.99  . COLONOSCOPY     Hx: of  . FISTULA SUPERFICIALIZATION Right 07/28/2016   Procedure: FISTULA SUPERFICIALIZATION;  Surgeon: [name], MD;  Location: [place];  Service: Vascular;  Laterality: Right;  . FISTULA SUPERFICIALIZATION Right 10/13/2016   Procedure: BRACHIOCEPHALIC  ARTERIOVENOUS FISTULA SUPERFICIALIZATION;  Surgeon: [name], MD;  Location: [place];  Service: Vascular;  Laterality: Right;  . FISTULOGRAM N/A 05/04/2013   Procedure: CENTRAL VENOGRAM;  Surgeon: Conrad [place], MD;  Location: [place];  Service: Vascular;  Laterality: N/A;  . INSERTION OF DIALYSIS CATHETER Right 05/04/2013   Procedure: INSERTION OF DIALYSIS CATHETER;  Surgeon: Conrad [place], MD;  Location: [place];  Service: Vascular;  Laterality: Right;  Ultrasound guided  . INSERTION OF DIALYSIS CATHETER N/A 07/28/2016   Procedure: INSERTION OF DIALYSIS CATHETER;  Surgeon: [name], MD;  Location: [place];  Service: Vascular;  Laterality: N/A;  . LIGATION OF ARTERIOVENOUS  FISTULA Left 05/04/2013   Procedure: LIGATION OF LEFT RADIAL CEPHALIC ARTERIOVENOUS  FISTULA;  Surgeon: Conrad [place], MD;  Location: [place];  Service: Vascular;  Laterality: Left;  Ultrasound guided  . LIGATION OF ARTERIOVENOUS  FISTULA Right 07/28/2016   Procedure: LIGATION OF RADIOCEPHALIC ARTERIOVENOUS  FISTULA;  Surgeon: [name], MD;  Location: [place];  Service: Vascular;  Laterality: Right;  . LOWER EXTREMITY ANGIOGRAPHY N/A 12/30/2016   Procedure: Lower Extremity Angiography;  Surgeon: Waynetta Sandy, MD;  Location: [place] [facility];  Service: Cardiovascular;  Laterality: N/A;  . LUMBAR SPINE SURGERY    . PERIPHERAL VASCULAR ATHERECTOMY Right 12/30/2016   Procedure: Peripheral Vascular Atherectomy;  Surgeon: Waynetta Sandy, MD;  Location: [place] [facility];  Service: Cardiovascular;  Laterality: Right;  AT and PT  . PERIPHERAL VASCULAR BALLOON ANGIOPLASTY Right 12/30/2016   Procedure: Peripheral Vascular Balloon Angioplasty;  Surgeon: Waynetta Sandy, MD;  Location: [place] [facility];  Service: Cardiovascular;  Laterality: Right;  PTA of PT and AT  . PERIPHERAL VASCULAR BALLOON ANGIOPLASTY  02/23/2017   Procedure: Peripheral Vascular Balloon Angioplasty;  Surgeon:  [name], MD;  Location: [place] [facility];  Service: Cardiovascular;;  RT. Anterior Tib.  [name] PERIPHERAL VASCULAR CATHETERIZATION N/A 01/24/2015   Procedure: Fistulagram;  Surgeon: Conrad Sanborn, MD;  Location: [place] [facility];  Service: Cardiovascular;  Laterality: N/A;  . PERIPHERAL VASCULAR CATHETERIZATION Right 06/04/2016   Procedure: A/V Shuntogram/Fistulagram;  Surgeon: Conrad Craigmont, MD;  Location: [place] [facility];  Service: Cardiovascular;  Laterality: Right;  . REVISON OF ARTERIOVENOUS FISTULA Right 01/02/2014   Procedure: REVISON OF ARTERIOVENOUS FISTULA ANASTOMOSIS;  Surgeon: Conrad [place], MD;  Location: [place];  Service: Vascular;  Laterality: Right;  . REVISON OF ARTERIOVENOUS FISTULA Right 01/02/2016   Procedure: REVISION OF RADIOCEPHALIC ARTERIOVENOUS FISTULA  with BOVINE PATCH ANGIOPLASTY RIGHT RADIAL ARTERY;  Surgeon: Mal [name], MD;  Location: [place];  Service: Vascular;  Laterality: Right;  . SHUNTOGRAM N/A 11/07/2013   Procedure: Fistulogram;  Surgeon: [name], MD;  Location: [facility] C[facility];  Service: Cardiovascular;  Laterality: N/A;  . TRANSMETATARSAL AMPUTATION Right 01/01/2017   Procedure: TRANSMETATARSAL AMPUTATION;  Surgeon: [name], MD;  Location: [place];  Service: Vascular;  Laterality: Right;    Allergies  Allergen Reactions  . Bee Venom Anaphylaxis  . Iodine Swelling  . Penicillins Swelling and Other (See Comments)    SWELLING REACTION UNSPECIFIED   Has patient had a PCN reaction causing immediate rash, facial/tongue/throat swelling, SOB or lightheadedness with hypotension: No Has patient had a PCN reaction causing severe rash involving mucus membranes or skin necrosis: No Has patient had a PCN reaction that required hospitalization No Has patient had a PCN reaction occurring within the last 10 years: No If all of the above answers are "NO", then may proceed with Cephalosporin use.  . Sulfadiazine Other (See Comments)    1% Silver  Sulfadiazine cream causes burning over a large area of skin.    Current Outpatient Prescriptions  Medication Sig Dispense Refill  . acetaminophen (TYLENOL) 500 MG tablet Take 1,000 mg by mouth every 6 (six) hours as needed for mild pain.    [name] aspirin EC 81 MG tablet Take 1 tablet (81 mg total) by mouth daily. 90 tablet 0  . atorvastatin (LIPITOR) 40 MG tablet Take 1 tablet (40 mg total) by mouth daily at 6 PM. 30 tablet 0  . B Complex-C-Folic Acid (DIALYVITE TABLET) TABS Take 1 tablet by mouth every Monday, Wednesday, and Friday.     . bisacodyl (DULCOLAX) 5 MG EC tablet Take 2 tablets (10 mg total) by mouth daily as needed for moderate constipation. 30 tablet 0  . carvedilol (COREG) 12.5 MG tablet Take 1 tablet (12.5 mg total) by mouth 2 (two) times daily with a meal. 2 times per day on Sun, Tu, Thur, and Sat.  Hold on dialysis days. 60 tablet 0  . cinacalcet (SENSIPAR) 30 MG tablet Take 30 mg by mouth daily.    [name] EPIPEN 2-PAK 0.3 MG/0.3ML SOAJ injection Inject 0.3 mg into the muscle once as needed (For anaphylaxis.).     [name] finasteride (PROSCAR) 5 MG tablet Take 1 tablet (5 mg total) by mouth daily. 30 tablet 0  . HYDROcodone-acetaminophen (NORCO/VICODIN) 5-325 MG tablet Take 1 tablet by mouth every 4 (four) hours as needed for moderate pain. 10 tablet 0  . LANTUS SOLOSTAR 100 UNIT/ML Solostar Pen Inject 15 Units into the skin at bedtime. 15 mL 0  . omeprazole (PRILOSEC) 40 MG capsule Take 40 mg by mouth daily.     . polyethylene glycol powder (GLYCOLAX/MIRALAX) powder Take 17 g by mouth daily. 255 g 0  . RENVELA 800 MG tablet Take 800-2,400 mg by mouth 3 (three) times daily with meals. Take 2400 mg by mouth 3 times daily with meals and 800 mg by mouth with snacks    . senna (SENOKOT) 8.6 MG TABS tablet Take 2 tablets (17.2 mg total) by mouth at bedtime. 120 each 0  . vancomycin (VANCOCIN) 1-5 GM/200ML-% SOLN Inject 200 mLs (1,000 mg total) into the vein every Monday, Wednesday, and Friday with  hemodialysis. 4000 mL 0  . clopidogrel (PLAVIX) 75 MG tablet Take 1 tablet (75 mg total) by mouth daily with breakfast. (Patient not taking: Reported on 04/01/2017) 30 tablet 0   No current facility-administered medications for this visit.     ROS: See  HPI for pertinent positives and negatives.   Physical Examination  Vitals:   04/01/17 1015  BP: (!) 163/82  Pulse: 64  Resp: 17  Temp: (!) 97.2 F (36.2 C)  TempSrc: Oral  SpO2: 96%  Weight: 273 lb (123.8 kg)  Height: 6\' 2"  (1.88 m)   Body mass index is 35.05 kg/m.  General:The patient appears their stated age, obese male seated in w/c.   HEENT:  No gross abnormalities Pulmonary: Respirations are non-labored Abdomen: Softly obese and non-tender. Musculoskeletal: Right BKA stump with bulky dressing.  Neurologic: No focal weakness or paresthesias are detected, is hard of hearing.  Skin: There are no ulcer or rashes noted. Psychiatric: The patient has normal affect. Cardiovascular: There is a regular rate and rhythm without significant murmur appreciated. The right upper arm AV fistula has a palpable thrill. Bilateral radial pulses are palpable.     Right BKA before staples removed   Left heel ulcer    Left great toe ulcer    ASSESSMENT: Jacob [name] is a 66 y.o. male who is s/p right BKA on 02-28-17 by Dr. Donnetta Hutching for nonhealing right transmetatarsal amputation with MRSA sepsis.  Dr. Trula Slade, Dr. Scot Dock, Dr. Bridgett Larsson, and Dr. Donzetta Matters have also been involved in his care and creation of HD access and PAOD.  Dr. Donzetta Matters spoke with and examined pt. Left heel and medial aspect great toe ulcer. Shiny appearance of left calf.   Well healed right BKA incision. All sutures removed and steri strips applied.    PLAN:  Based on the patient's previous vascular studies and examination, pt will be scheduled for an arteriogram, possible intervention left leg, by Dr. Bridgett Larsson on 04-08-17.  I discussed in depth with the patient the nature of  atherosclerosis, and emphasized the importance of maximal medical management including strict control of blood pressure, blood glucose, and lipid levels, obtaining regular exercise, and continued cessation of smoking.  The patient is aware that without maximal medical management the underlying atherosclerotic disease process will progress, limiting the benefit of any interventions.  The patient was given information about PAD including signs, symptoms, treatment, what symptoms should prompt the patient to seek immediate medical care, and risk reduction measures to take.  Clemon Chambers, RN, MSN, FNP-C [facility] [employer] Phone: [phone]  Clinic MD: Donzetta Matters  04/01/17 10:32 AM

## 2017-04-08 NOTE — Progress Notes (Signed)
Pt c/o groin pain, right groin level 0. Dr Bridgett Larsson was called and order followed

## 2017-04-08 NOTE — Progress Notes (Signed)
Right femoral sheath removed and pressure held for 20 minutes. Right distal pedal pulse palpable and groin level 0. Bed rest begins at 12:50 for 4 hours. Patient verbalizes understanding of instructions regarding keeping his head down and right leg straight. No apparent complications.

## 2017-04-08 NOTE — Progress Notes (Signed)
Spoke with Jacob [name] in infection prevention regarding the need for precautions, she reviewed the chart patient does not need to be on contact precautions at this time, if gets admitted will need to be reswabbed.

## 2017-04-08 NOTE — Op Note (Addendum)
OPERATIVE NOTE   PROCEDURE: 1.  Right common femoral artery cannulation under ultrasound guidance 2.  Placement of catheter in aorta 3.  Aortogram 4.  Second order arterial selection 5.  Left leg runoff 6.  Angioplasty of left below-the-knee popliteal artery (2.5 mm x 150 mm) 7.  Angioplasty of left anterior tibial artery (2.5 mm x 150 mm) 8.  Angioplasty of left dorsalis pedis artery (2 mm x 20 mm) 9.  Conscious sedation for 83 minutes  PRE-OPERATIVE DIAGNOSIS: left foot ulcers  POST-OPERATIVE DIAGNOSIS: same as above   SURGEON: [name], MD  ANESTHESIA: conscious sedation  ESTIMATED BLOOD LOSS: 50 cc  CONTRAST: 115 cc  FINDING(S):  Aorta: patent  Superior mesenteric artery: not visualized Celiac artery: not visualized   Right Left  RA Patent, no nephrogram Patent, no nephrogram  CIA Patent Patent  EIA Patent Patent  IIA Patent, mid-segment stenosis 50-75% Patent, distal stenosis 50-75%  CFA patent patent  SFA  Patent, serial 50% stenoses  PFA  Patent  Pop  Patent  Trif  Proximal tibioperoneal trunk sub-total occlusion  AT  Patent proximally occluded distal 1/2: resolved after angioplasty  Pero  Occluded proximally, reconstitutes via collaterals from anterior tibial artery   PT  Occluded  DP  Patent, >75% stenosis proximally: >30% residual stenosis at end of case, primary runoff to foot   SPECIMEN(S):  none  INDICATIONS:   Jacob [name] is a 66 y.o. male who presents with left foot ulcers.  The patient presents for: aortogram, left leg runoff, and possible left leg runoff.  I discussed with the patient the nature of angiographic procedures, especially the limited patencies of any endovascular intervention.  The patient is aware of that the risks of an angiographic procedure include but are not limited to: bleeding, infection, access site complications, renal failure, embolization, rupture of vessel, dissection, possible need for emergent surgical intervention,  possible need for surgical procedures to treat the patient's pathology, and stroke and death.  The patient is aware of the risks and agrees to proceed.  DESCRIPTION: After full informed consent was obtained from the patient, the patient was brought back to the angiography suite.  The patient was placed supine upon the angiography table and connected to cardiopulmonary monitoring equipment.  Prior to arrival to the angiography suite, the patient had been premedicated with steroids and histamine blockers for a previously documented iodine allergy.  The patient was then given conscious sedation, the amounts of which are documented in the patient's chart.  A circulating technician maintained continuous monitoring of the patient's cardiopulmonary status.  Additionally, the control room radiologic technician provided backup monitoring throughout the procedure.  The patient was prepped and drape in the standard fashion for an angiographic procedure.  At this point, attention was turned to the right groin.  Under ultrasound guidance,  The subcutaneous tissue surrounding the right common femoral artery was anesthesized with 1% lidocaine with epinephrine.  The artery was then cannulated with a 18 gauge needle.  The Bentson wire was passed up into the aorta.  The needle was exchanged for a 5-Fr sheath, which was advanced over the wire into the common femoral artery.  The dilator was then removed.  The Omniflush catheter was then loaded over the wire up to the level of L1.  The catheter was connected to the power injector circuit.  After de-airring and de-clotting the circuit, a power injector aortogram was completed.  The findings are listed above.  Using a Bentson wire  and Omniflush catheter, the left common iliac artery was selected.  The catheter and wire were advanced into the external iliac artery.  An automated left leg runoff was completed.  The findings are listed above.  Given the patient was at risk of  left below-knee amputation, I felt attempt at intervention was needed.  The wire was exchanged for a Rosen wire.  The patient's right femoral sheath was exchanged for a 6-Fr Destination sheath, which was lodged in the left common femoral artery.  The dilator was removed.  The patient was given 13000 units of Heparin intravenously, which was a therapeutic bolus.    I loaded a Navicross catheter over the wire and steered down to the below-the-knee using the catheter and Rosen wire.  I then did a hand injection to verify the takeoff of the anterior tibial artery which appeared to be the only candidate for salvage.  I exchanged the wire for a long Glidewire.  Using this combination of catheter and wire, I selected the left anterior tibial artery and advanced into the mid-segment.  The wire appeared to advance with minimal resistance until the distal 1/3 of the artery.  At this point, I exchanged the wire for a V-18 which I used to advance into the distal anterior tibial artery.  I could not advance any further and hand injection demonstrated I had not re-entered the dorsalis pedis artery.  I then exchanged the wire for a 6 Newton dissecting tip 0.014" wire and tapped the distal occlusion without any progress.  I then switched to a 12 Newton tip dissection wire and was able to cross the distal occlusion, advancing into the dorsalis pedis artery.  I did a hand injection to verify re-entry into the dorsalis pedis artery.    I then selected a 2.5 mm x 150 mm balloon and advanced as far as the balloon would go, halting in the proximal dorsalis pedis artery.  I inflated the the angioplasty along the entire anterior tibial artery and extending into the below-the-knee popliteal artery at 14 atm for 3 minutes followed by 4 atm at 2 minutes.  The balloon was removed.  On completion injections, the occluded anterior tibial artery was reconstituted without obvious dissection.  The completion images were marred by poor patient  cooperation.  The patient had woken up and was somewhat disinhibited.  The completion injection in the distal leg demonstrated residual proximal dorsalis pedis artery stenosis.  I selected a 2 mm x 20 mm balloon and advanced passed the proximal dorsalis pedis artery stenosis, I inflated the balloon to 10 atm for 2 minute along the entire articular segment of the artery.  The patient was becoming increasing agitated.  I removed the balloon and did a completion hand injection.  This demonstrated residual stenosis >30% in the proximal dorsalis pedis artery, with greatly augment flow into the peroneal and anterior tibial arteries.  I felt the patient was becoming a danger to himself, so the procedure had to be halted.  I removed the wire and pulled the sheath back into the right external iliac artery.  The sheath was aspirated.  No clots were present and the sheath was reloaded with heparinized saline.  The sheath will be removed once the patient's anticoagulation is reversed.  Based on the images, the patient would likely benefit from an orbital atherectomy with angioplasty in the future, best done in the hybrid OR for safety reasons.  As this procedure was done primarily for wound management, hopefully the recannulation  of the anterior tibial artery will be adequate to help him heal his left foot ulcers.   COMPLICATIONS: none  CONDITION: stable   [name], MD, [facility] [place] Office: [phone] Pager: [phone]  04/08/2017, 9:27 AM

## 2017-04-08 NOTE — Discharge Instructions (Signed)
Angiogram, Care After °This sheet gives you information about how to care for yourself after your procedure. Your health care provider may also give you more specific instructions. If you have problems or questions, contact your health care provider. °What can I expect after the procedure? °After the procedure, it is common to have bruising and tenderness at the catheter insertion area. °Follow these instructions at home: °Insertion site care  °· Follow instructions from your health care provider about how to take care of your insertion site. Make sure you: °¨ Wash your hands with soap and water before you change your bandage (dressing). If soap and water are not available, use hand sanitizer. °¨ Change your dressing as told by your health care provider. °¨ Leave stitches (sutures), skin glue, or adhesive strips in place. These skin closures may need to stay in place for 2 weeks or longer. If adhesive strip edges start to loosen and curl up, you may trim the loose edges. Do not remove adhesive strips completely unless your health care provider tells you to do that. °· Do not take baths, swim, or use a hot tub until your health care provider approves. °· You may shower 24-48 hours after the procedure or as told by your health care provider. °¨ Gently wash the site with plain soap and water. °¨ Pat the area dry with a clean towel. °¨ Do not rub the site. This may cause bleeding. °· Do not apply powder or lotion to the site. Keep the site clean and dry. °· Check your insertion site every day for signs of infection. Check for: °¨ Redness, swelling, or pain. °¨ Fluid or blood. °¨ Warmth. °¨ Pus or a bad smell. °Activity  °· Rest as told by your health care provider, usually for 1-2 days. °· Do not lift anything that is heavier than 10 lbs. (4.5 kg) or as told by your health care provider. °· Do not drive for 24 hours if you were given a medicine to help you relax (sedative). °· Do not drive or use heavy machinery while  taking prescription pain medicine. °General instructions  °· Return to your normal activities as told by your health care provider, usually in about a week. Ask your health care provider what activities are safe for you. °· If the catheter site starts bleeding, lie flat and put pressure on the site. If the bleeding does not stop, get help right away. This is a medical emergency. °· Drink enough fluid to keep your urine clear or pale yellow. This helps flush the contrast dye from your body. °· Take over-the-counter and prescription medicines only as told by your health care provider. °· Keep all follow-up visits as told by your health care provider. This is important. °Contact a health care provider if: °· You have a fever or chills. °· You have redness, swelling, or pain around your insertion site. °· You have fluid or blood coming from your insertion site. °· The insertion site feels warm to the touch. °· You have pus or a bad smell coming from your insertion site. °· You have bruising around the insertion site. °· You notice blood collecting in the tissue around the catheter site (hematoma). The hematoma may be painful to the touch. °Get help right away if: °· You have severe pain at the catheter insertion area. °· The catheter insertion area swells very fast. °· The catheter insertion area is bleeding, and the bleeding does not stop when you hold steady pressure on   the area. °· The area near or just beyond the catheter insertion site becomes pale, cool, tingly, or numb. °These symptoms may represent a serious problem that is an emergency. Do not wait to see if the symptoms will go away. Get medical help right away. Call your local emergency services (911 in the [place] Do not drive yourself to the hospital. °Summary °· After the procedure, it is common to have bruising and tenderness at the catheter insertion area. °· After the procedure, it is important to rest and drink plenty of fluids. °· Do not take baths,  swim, or use a hot tub until your health care provider says it is okay to do so. You may shower 24-48 hours after the procedure or as told by your health care provider. °· If the catheter site starts bleeding, lie flat and put pressure on the site. If the bleeding does not stop, get help right away. This is a medical emergency. °This information is not intended to replace advice given to you by your health care provider. Make sure you discuss any questions you have with your health care provider. °Document Released: 02/05/2005 Document Revised: 06/24/2016 Document Reviewed: 06/24/2016 °[employer] Interactive Patient Education © 2017 [employer] ° °

## 2017-04-08 NOTE — Interval H&P Note (Signed)
   History and Physical Update  The patient was interviewed and re-examined.  The patient's previous History and Physical has been reviewed and is unchanged from my NP's consult.  There is no change in the plan of care: aortogram, left leg runoff, and possible intervention.   I discussed with the patient the nature of angiographic procedures, especially the limited patencies of any endovascular intervention.    The patient is aware of that the risks of an angiographic procedure include but are not limited to: bleeding, infection, access site complications, renal failure, embolization, rupture of vessel, dissection, arteriovenous fistula, possible need for emergent surgical intervention, possible need for surgical procedures to treat the patient's pathology, anaphylactic reaction to contrast, and stroke and death.    The patient is aware of the risks and agrees to proceed.   [name], MD, FACS [facility] Office: [phone] Pager: [phone]  04/08/2017, 7:16 AM

## 2017-04-09 ENCOUNTER — Emergency Department (HOSPITAL_COMMUNITY): Payer: Medicare Other

## 2017-04-09 ENCOUNTER — Inpatient Hospital Stay (HOSPITAL_COMMUNITY)
Admission: EM | Admit: 2017-04-09 | Discharge: 2017-04-10 | DRG: 919 | Disposition: A | Discharge status: Skilled Nursing Facility | Payer: Medicare Other | Attending: Internal Medicine | Admitting: Internal Medicine

## 2017-04-09 ENCOUNTER — Encounter (HOSPITAL_COMMUNITY): Payer: Self-pay | Admitting: *Deleted

## 2017-04-09 DIAGNOSIS — E875 Hyperkalemia: Secondary | ICD-10-CM | POA: Diagnosis not present

## 2017-04-09 DIAGNOSIS — D62 Acute posthemorrhagic anemia: Secondary | ICD-10-CM | POA: Diagnosis not present

## 2017-04-09 DIAGNOSIS — Y838 Other surgical procedures as the cause of abnormal reaction of the patient, or of later complication, without mention of misadventure at the time of the procedure: Secondary | ICD-10-CM | POA: Diagnosis not present

## 2017-04-09 DIAGNOSIS — S301XXD Contusion of abdominal wall, subsequent encounter: Secondary | ICD-10-CM

## 2017-04-09 DIAGNOSIS — N186 End stage renal disease: Secondary | ICD-10-CM

## 2017-04-09 DIAGNOSIS — E1122 Type 2 diabetes mellitus with diabetic chronic kidney disease: Secondary | ICD-10-CM | POA: Diagnosis present

## 2017-04-09 DIAGNOSIS — Z794 Long term (current) use of insulin: Secondary | ICD-10-CM

## 2017-04-09 DIAGNOSIS — D631 Anemia in chronic kidney disease: Secondary | ICD-10-CM | POA: Diagnosis present

## 2017-04-09 DIAGNOSIS — Z9841 Cataract extraction status, right eye: Secondary | ICD-10-CM | POA: Diagnosis not present

## 2017-04-09 DIAGNOSIS — I429 Cardiomyopathy, unspecified: Secondary | ICD-10-CM | POA: Diagnosis present

## 2017-04-09 DIAGNOSIS — Z961 Presence of intraocular lens: Secondary | ICD-10-CM | POA: Diagnosis present

## 2017-04-09 DIAGNOSIS — I621 Nontraumatic extradural hemorrhage: Secondary | ICD-10-CM | POA: Diagnosis not present

## 2017-04-09 DIAGNOSIS — Z992 Dependence on renal dialysis: Secondary | ICD-10-CM | POA: Diagnosis not present

## 2017-04-09 DIAGNOSIS — E119 Type 2 diabetes mellitus without complications: Secondary | ICD-10-CM

## 2017-04-09 DIAGNOSIS — I10 Essential (primary) hypertension: Secondary | ICD-10-CM | POA: Diagnosis not present

## 2017-04-09 DIAGNOSIS — E11621 Type 2 diabetes mellitus with foot ulcer: Secondary | ICD-10-CM | POA: Diagnosis present

## 2017-04-09 DIAGNOSIS — E039 Hypothyroidism, unspecified: Secondary | ICD-10-CM

## 2017-04-09 DIAGNOSIS — Z89511 Acquired absence of right leg below knee: Secondary | ICD-10-CM | POA: Diagnosis not present

## 2017-04-09 DIAGNOSIS — R0602 Shortness of breath: Secondary | ICD-10-CM | POA: Diagnosis not present

## 2017-04-09 DIAGNOSIS — L97509 Non-pressure chronic ulcer of other part of unspecified foot with unspecified severity: Secondary | ICD-10-CM | POA: Diagnosis present

## 2017-04-09 DIAGNOSIS — N400 Enlarged prostate without lower urinary tract symptoms: Secondary | ICD-10-CM | POA: Diagnosis present

## 2017-04-09 DIAGNOSIS — I428 Other cardiomyopathies: Secondary | ICD-10-CM

## 2017-04-09 DIAGNOSIS — K219 Gastro-esophageal reflux disease without esophagitis: Secondary | ICD-10-CM | POA: Diagnosis present

## 2017-04-09 DIAGNOSIS — F419 Anxiety disorder, unspecified: Secondary | ICD-10-CM | POA: Diagnosis present

## 2017-04-09 DIAGNOSIS — D638 Anemia in other chronic diseases classified elsewhere: Secondary | ICD-10-CM | POA: Diagnosis present

## 2017-04-09 DIAGNOSIS — I25110 Atherosclerotic heart disease of native coronary artery with unstable angina pectoris: Secondary | ICD-10-CM | POA: Diagnosis not present

## 2017-04-09 DIAGNOSIS — S301XXA Contusion of abdominal wall, initial encounter: Secondary | ICD-10-CM | POA: Diagnosis present

## 2017-04-09 DIAGNOSIS — M7989 Other specified soft tissue disorders: Secondary | ICD-10-CM | POA: Diagnosis not present

## 2017-04-09 DIAGNOSIS — R001 Bradycardia, unspecified: Secondary | ICD-10-CM | POA: Diagnosis present

## 2017-04-09 DIAGNOSIS — Z9862 Peripheral vascular angioplasty status: Secondary | ICD-10-CM | POA: Diagnosis not present

## 2017-04-09 DIAGNOSIS — I120 Hypertensive chronic kidney disease with stage 5 chronic kidney disease or end stage renal disease: Secondary | ICD-10-CM | POA: Diagnosis present

## 2017-04-09 DIAGNOSIS — R109 Unspecified abdominal pain: Secondary | ICD-10-CM | POA: Diagnosis not present

## 2017-04-09 DIAGNOSIS — Z89431 Acquired absence of right foot: Secondary | ICD-10-CM | POA: Diagnosis not present

## 2017-04-09 DIAGNOSIS — N2581 Secondary hyperparathyroidism of renal origin: Secondary | ICD-10-CM | POA: Diagnosis present

## 2017-04-09 DIAGNOSIS — L7632 Postprocedural hematoma of skin and subcutaneous tissue following other procedure: Secondary | ICD-10-CM | POA: Diagnosis present

## 2017-04-09 DIAGNOSIS — Z9842 Cataract extraction status, left eye: Secondary | ICD-10-CM | POA: Diagnosis not present

## 2017-04-09 DIAGNOSIS — E1151 Type 2 diabetes mellitus with diabetic peripheral angiopathy without gangrene: Secondary | ICD-10-CM | POA: Diagnosis present

## 2017-04-09 LAB — CBC
HCT: 29.3 % — ABNORMAL LOW (ref 39.0–52.0)
Hemoglobin: 8.7 g/dL — ABNORMAL LOW (ref 13.0–17.0)
MCH: 25 pg — ABNORMAL LOW (ref 26.0–34.0)
MCHC: 29.7 g/dL — ABNORMAL LOW (ref 30.0–36.0)
MCV: 84.2 fL (ref 78.0–100.0)
Platelets: 271 K/uL (ref 150–400)
RBC: 3.48 MIL/uL — ABNORMAL LOW (ref 4.22–5.81)
RDW: 18.3 % — ABNORMAL HIGH (ref 11.5–15.5)
WBC: 8.1 K/uL (ref 4.0–10.5)

## 2017-04-09 LAB — CBC WITH DIFFERENTIAL/PLATELET
BASOS ABS: 0 10*3/uL (ref 0.0–0.1)
BASOS PCT: 0 %
EOS PCT: 0 %
Eosinophils Absolute: 0 10*3/uL (ref 0.0–0.7)
HCT: 28 % — ABNORMAL LOW (ref 39.0–52.0)
Hemoglobin: 8.5 g/dL — ABNORMAL LOW (ref 13.0–17.0)
Lymphocytes Relative: 5 %
Lymphs Abs: 0.3 10*3/uL — ABNORMAL LOW (ref 0.7–4.0)
MCH: 26.1 pg (ref 26.0–34.0)
MCHC: 30.4 g/dL (ref 30.0–36.0)
MCV: 85.9 fL (ref 78.0–100.0)
MONO ABS: 0.3 10*3/uL (ref 0.1–1.0)
Monocytes Relative: 4 %
Neutro Abs: 5.6 10*3/uL (ref 1.7–7.7)
Neutrophils Relative %: 91 %
PLATELETS: 255 10*3/uL (ref 150–400)
RBC: 3.26 MIL/uL — AB (ref 4.22–5.81)
RDW: 18.1 % — AB (ref 11.5–15.5)
WBC: 6.2 10*3/uL (ref 4.0–10.5)

## 2017-04-09 LAB — COMPREHENSIVE METABOLIC PANEL
ALBUMIN: 2.7 g/dL — AB (ref 3.5–5.0)
ALT: 13 U/L — ABNORMAL LOW (ref 17–63)
AST: 17 U/L (ref 15–41)
Alkaline Phosphatase: 156 U/L — ABNORMAL HIGH (ref 38–126)
Anion gap: 11 (ref 5–15)
BUN: 88 mg/dL — AB (ref 6–20)
CHLORIDE: 95 mmol/L — AB (ref 101–111)
CO2: 22 mmol/L (ref 22–32)
Calcium: 8.4 mg/dL — ABNORMAL LOW (ref 8.9–10.3)
Creatinine, Ser: 10.7 mg/dL — ABNORMAL HIGH (ref 0.61–1.24)
GFR calc Af Amer: 5 mL/min — ABNORMAL LOW (ref >60)
GFR, EST NON AFRICAN AMERICAN: 4 mL/min — AB (ref >60)
GLUCOSE: 346 mg/dL — AB (ref 65–99)
POTASSIUM: 6.5 mmol/L — AB (ref 3.5–5.1)
Sodium: 128 mmol/L — ABNORMAL LOW (ref 135–145)
Total Bilirubin: 0.6 mg/dL (ref 0.3–1.2)
Total Protein: 8.7 g/dL — ABNORMAL HIGH (ref 6.5–8.1)

## 2017-04-09 LAB — I-STAT CHEM 8, ED
BUN: 80 mg/dL — ABNORMAL HIGH (ref 6–20)
CREATININE: 11.5 mg/dL — AB (ref 0.61–1.24)
Calcium, Ion: 1.05 mmol/L — ABNORMAL LOW (ref 1.15–1.40)
Chloride: 97 mmol/L — ABNORMAL LOW (ref 101–111)
GLUCOSE: 349 mg/dL — AB (ref 65–99)
HCT: 30 % — ABNORMAL LOW (ref 39.0–52.0)
HEMOGLOBIN: 10.2 g/dL — AB (ref 13.0–17.0)
Potassium: 6.6 mmol/L (ref 3.5–5.1)
SODIUM: 132 mmol/L — AB (ref 135–145)
TCO2: 22 mmol/L (ref 22–32)

## 2017-04-09 LAB — BASIC METABOLIC PANEL WITH GFR
Anion gap: 13 (ref 5–15)
BUN: 88 mg/dL — ABNORMAL HIGH (ref 6–20)
CO2: 22 mmol/L (ref 22–32)
Calcium: 8.8 mg/dL — ABNORMAL LOW (ref 8.9–10.3)
Chloride: 96 mmol/L — ABNORMAL LOW (ref 101–111)
Creatinine, Ser: 11.23 mg/dL — ABNORMAL HIGH (ref 0.61–1.24)
GFR calc Af Amer: 5 mL/min — ABNORMAL LOW
GFR calc non Af Amer: 4 mL/min — ABNORMAL LOW
Glucose, Bld: 234 mg/dL — ABNORMAL HIGH (ref 65–99)
Potassium: 5.8 mmol/L — ABNORMAL HIGH (ref 3.5–5.1)
Sodium: 131 mmol/L — ABNORMAL LOW (ref 135–145)

## 2017-04-09 LAB — PROTIME-INR
INR: 1.71
PROTHROMBIN TIME: 19.9 s — AB (ref 11.4–15.2)

## 2017-04-09 LAB — TYPE AND SCREEN
ABO/RH(D): O POS
ANTIBODY SCREEN: NEGATIVE

## 2017-04-09 LAB — HEPATITIS B SURFACE ANTIGEN: Hepatitis B Surface Ag: NEGATIVE

## 2017-04-09 LAB — POCT ACTIVATED CLOTTING TIME: Activated Clotting Time: 175 seconds

## 2017-04-09 LAB — GLUCOSE, CAPILLARY
GLUCOSE-CAPILLARY: 229 mg/dL — AB (ref 65–99)
Glucose-Capillary: 252 mg/dL — ABNORMAL HIGH (ref 65–99)
Glucose-Capillary: 183 mg/dL — ABNORMAL HIGH (ref 65–99)

## 2017-04-09 LAB — POTASSIUM: POTASSIUM: 5.7 mmol/L — AB (ref 3.5–5.1)

## 2017-04-09 LAB — MRSA PCR SCREENING: MRSA by PCR: NEGATIVE

## 2017-04-09 MED ORDER — DEXTROSE 50 % IV SOLN
1.0000 | Freq: Once | INTRAVENOUS | Status: AC
  Administered 2017-04-09: 50 mL via INTRAVENOUS

## 2017-04-09 MED ORDER — IOPAMIDOL (ISOVUE-370) INJECTION 76%
100.0000 mL | Freq: Once | INTRAVENOUS | Status: AC | PRN
  Administered 2017-04-09: 100 mL via INTRAVENOUS

## 2017-04-09 MED ORDER — SODIUM CHLORIDE 0.9 % IV SOLN
1.0000 g | Freq: Once | INTRAVENOUS | Status: AC
  Administered 2017-04-09: 1 g via INTRAVENOUS
  Filled 2017-04-09: qty 10

## 2017-04-09 MED ORDER — INSULIN GLARGINE 100 UNIT/ML ~~LOC~~ SOLN
15.0000 [IU] | Freq: Every day | SUBCUTANEOUS | Status: DC
  Administered 2017-04-09: 15 [IU] via SUBCUTANEOUS
  Filled 2017-04-09 (×2): qty 0.15

## 2017-04-09 MED ORDER — DEXTROSE 50 % IV SOLN
INTRAVENOUS | Status: AC
  Filled 2017-04-09: qty 50

## 2017-04-09 MED ORDER — SODIUM CHLORIDE 0.9% FLUSH: 3.0000 mL | INTRAVENOUS | Status: DC | PRN

## 2017-04-09 MED ORDER — DEXAMETHASONE SODIUM PHOSPHATE 4 MG/ML IJ SOLN
INTRAMUSCULAR | Status: AC
  Filled 2017-04-09: qty 3

## 2017-04-09 MED ORDER — SODIUM POLYSTYRENE SULFONATE 15 GM/60ML PO SUSP
30.0000 g | Freq: Once | ORAL | Status: AC
  Administered 2017-04-09: 30 g via ORAL

## 2017-04-09 MED ORDER — CALCIUM GLUCONATE 10 % IV SOLN
1.0000 g | Freq: Once | INTRAVENOUS | Status: AC
  Administered 2017-04-09: 1 g via INTRAVENOUS

## 2017-04-09 MED ORDER — INSULIN ASPART 100 UNIT/ML ~~LOC~~ SOLN
0.0000 [IU] | Freq: Three times a day (TID) | SUBCUTANEOUS | Status: DC
  Administered 2017-04-09: 3 [IU] via SUBCUTANEOUS
  Administered 2017-04-09: 5 [IU] via SUBCUTANEOUS
  Administered 2017-04-10: 3 [IU] via SUBCUTANEOUS

## 2017-04-09 MED ORDER — INSULIN ASPART 100 UNIT/ML IV SOLN
10.0000 [IU] | Freq: Once | INTRAVENOUS | Status: AC
  Administered 2017-04-09: 10 [IU] via INTRAVENOUS

## 2017-04-09 MED ORDER — NALOXONE HCL 0.4 MG/ML IJ SOLN
0.4000 mg | Freq: Once | INTRAMUSCULAR | Status: AC
  Administered 2017-04-09: 0.4 mg via INTRAVENOUS

## 2017-04-09 MED ORDER — HYDROCODONE-ACETAMINOPHEN 5-325 MG PO TABS: 1.0000 | ORAL_TABLET | ORAL | Status: DC | PRN

## 2017-04-09 MED ORDER — ATORVASTATIN CALCIUM 40 MG PO TABS
40.0000 mg | ORAL_TABLET | Freq: Every day | ORAL | Status: DC
  Administered 2017-04-09 – 2017-04-10 (×2): 40 mg via ORAL
  Filled 2017-04-09 (×2): qty 1

## 2017-04-09 MED ORDER — SODIUM BICARBONATE 8.4 % IV SOLN
50.0000 meq | Freq: Once | INTRAVENOUS | Status: AC
  Administered 2017-04-09: 50 meq via INTRAVENOUS

## 2017-04-09 MED ORDER — SODIUM CHLORIDE 0.9 % IV SOLN
INTRAVENOUS | Status: AC
  Filled 2017-04-09: qty 10

## 2017-04-09 MED ORDER — INSULIN ASPART 100 UNIT/ML ~~LOC~~ SOLN: 0.0000 [IU] | Freq: Three times a day (TID) | SUBCUTANEOUS | Status: DC

## 2017-04-09 MED ORDER — SODIUM CHLORIDE 0.9% FLUSH
3.0000 mL | Freq: Two times a day (BID) | INTRAVENOUS | Status: DC
  Administered 2017-04-09 (×2): 3 mL via INTRAVENOUS

## 2017-04-09 MED ORDER — NALOXONE HCL 0.4 MG/ML IJ SOLN
INTRAMUSCULAR | Status: AC
  Filled 2017-04-09: qty 1

## 2017-04-09 MED ORDER — SODIUM CHLORIDE 0.9 % IV SOLN: 250.0000 mL | INTRAVENOUS | Status: DC | PRN

## 2017-04-09 MED ORDER — SODIUM POLYSTYRENE SULFONATE 15 GM/60ML PO SUSP
ORAL | Status: AC
  Filled 2017-04-09: qty 120

## 2017-04-09 MED ORDER — INSULIN ASPART 100 UNIT/ML ~~LOC~~ SOLN
SUBCUTANEOUS | Status: AC
  Filled 2017-04-09: qty 1

## 2017-04-09 NOTE — ED Notes (Signed)
ED Provider at bedside. 

## 2017-04-09 NOTE — Progress Notes (Signed)
Patient seen and examined. Admitted after midnight secondary to ABLA and hematoma on right groin after arteriogram done on 9/6. Patient was also found to have hyperkalemia. No CP, no SOB and hemodynamically stable. In early hours of admission was having some bradycardia. Patient seen by vascular surgery who recommended no further intervention and will have HD today to correct electrolytes. Will repeat CBC and BMET in am. Most likely home tomorrow if remains stable. Please refer to H&P written by Dr. Shanon Brow for further info/details on admission.  Barton Dubois MD [phone]

## 2017-04-09 NOTE — ED Notes (Signed)
phlebotomy has drawn blood, istat runnig

## 2017-04-09 NOTE — Procedures (Signed)
Sleeping on HD and diff to arouse.  No hemodynamic issues.  Will use 1k for 30 minutes. Tyne Banta C

## 2017-04-09 NOTE — H&P (Signed)
History and Physical    Jacob [name] LFY:[id number] DOB: [birth year] DOA: 04/09/2017  PCP: Sharilyn Sites, MD  Patient coming from: home  Chief Complaint:  Right groin pain  HPI: Jacob [name] is a 66 y.o. male with medical history significant of ESRD dialysis MWF, right BKA, HTN, stroke had a [facility] and comes in via EMS with right groin pain and swelling.  Pt has hematoma and bleeding to area per CT scan.  Dr Br[facility] called at [facility] stated this was normal.  Pt labs revealed a k level of 6.6 and bradycardic in the 40s.  Pt denies any sob.  He is due for dialysis today.  Dr Bridgett Larsson recommended holding pressure to area and transfer to [facility].    Review of Systems: As per HPI otherwise 10 point review of systems negative.   Past Medical History:  Diagnosis Date  . Anemia   . Anemia in chronic kidney disease(285.21)   . Anxiety   . Arthritis   . Depression   . Dyspnea    with exertion  . ESRD (end stage renal disease) on dialysis [facility])    "MWF; [facility] [place]" (02/18/2017)  . Essential hypertension   . GERD (gastroesophageal reflux disease)   . Glaucoma   . History of blood transfusion   . Insomnia   . Nonischemic cardiomyopathy ([place])   . Stroke [facility]) 2016   - "they said it was not a stroke"  . Type 2 diabetes mellitus ([place])    Type II    Past Surgical History:  Procedure Laterality Date  . A/V FISTULAGRAM Right 10/06/2016   Procedure: A/V Fistulagram;  Surgeon: [name], MD;  Location: [place] CV LAB;  Service: Cardiovascular;  Laterality: Right;  . ABDOMINAL AORTOGRAM N/A 12/30/2016   Procedure: Abdominal Aortogram;  Surgeon: Waynetta Sandy, MD;  Location: [place] [facility];  Service: Cardiovascular;  Laterality: N/A;  . ABDOMINAL AORTOGRAM N/A 04/08/2017   Procedure: ABDOMINAL AORTOGRAM;  Surgeon: Conrad Hinckley, MD;  Location: [place] CV LAB;  Service: Cardiovascular;  Laterality: N/A;  . ABDOMINAL AORTOGRAM W/LOWER EXTREMITY N/A  02/23/2017   Procedure: Abdominal Aortogram w/Lower Extremity;  Surgeon: [name], MD;  Location: [place] CV LAB;  Service: Cardiovascular;  Laterality: N/A;  Rt. leg  . AMPUTATION Right 02/28/2017   Procedure: RIGHT BELOW KNEE AMPUTATION;  Surgeon: [name], MD;  Location: [place];  Service: Vascular;  Laterality: Right;  . AV FISTULA PLACEMENT     Hx: of  . AV FISTULA PLACEMENT Right 05/04/2013   Procedure: ARTERIOVENOUS (AV) FISTULA CREATION- RIGHT ARM;  Surgeon: Conrad Corning, MD;  Location: [place];  Service: Vascular;  Laterality: Right;  Ultrasound guided  . AV FISTULA PLACEMENT Right 07/28/2016   Procedure: BRACHIOCEPHALIC ARTERIOVENOUS (AV) FISTULA CREATION;  Surgeon: [name], MD;  Location: [place];  Service: Vascular;  Laterality: Right;  . CATARACT EXTRACTION W/PHACO Left 07/16/2014   Procedure: CATARACT EXTRACTION PHACO AND INTRAOCULAR LENS PLACEMENT (IOC);  Surgeon: [name], MD;  [facility]: [facility];  Service: Ophthalmology;  Laterality: Left;  CDE:8.86  . CATARACT EXTRACTION W/PHACO Right 07/30/2014   Procedure: CATARACT EXTRACTION PHACO AND INTRAOCULAR LENS PLACEMENT (IOC);  Surgeon: [name], MD;  Location: [facility];  Service: Ophthalmology;  Laterality: Right;  CDE 8.99  . COLONOSCOPY     Hx: of  . FISTULA SUPERFICIALIZATION Right 07/28/2016   Procedure: FISTULA SUPERFICIALIZATION;  Surgeon: [name],  MD;  Location: [place];  Service: Vascular;  Laterality: Right;  . FISTULA SUPERFICIALIZATION Right 10/13/2016   Procedure: BRACHIOCEPHALIC ARTERIOVENOUS FISTULA SUPERFICIALIZATION;  Surgeon: [name], MD;  Location: [place];  Service: Vascular;  Laterality: Right;  . FISTULOGRAM N/A 05/04/2013   Procedure: CENTRAL VENOGRAM;  Surgeon: Conrad [place], MD;  Location: [place];  Service: Vascular;  Laterality: N/A;  . INSERTION OF DIALYSIS CATHETER Right 05/04/2013   Procedure: INSERTION OF DIALYSIS CATHETER;  Surgeon: Conrad [place], MD;  Location:  [place];  Service: Vascular;  Laterality: Right;  Ultrasound guided  . INSERTION OF DIALYSIS CATHETER N/A 07/28/2016   Procedure: INSERTION OF DIALYSIS CATHETER;  Surgeon: [name], MD;  Location: [place];  Service: Vascular;  Laterality: N/A;  . LIGATION OF ARTERIOVENOUS  FISTULA Left 05/04/2013   Procedure: LIGATION OF LEFT RADIAL CEPHALIC ARTERIOVENOUS  FISTULA;  Surgeon: Conrad Pelican, MD;  Location: [place];  Service: Vascular;  Laterality: Left;  Ultrasound guided  . LIGATION OF ARTERIOVENOUS  FISTULA Right 07/28/2016   Procedure: LIGATION OF RADIOCEPHALIC ARTERIOVENOUS  FISTULA;  Surgeon: [name], MD;  Location: [place];  Service: Vascular;  Laterality: Right;  . LOWER EXTREMITY ANGIOGRAPHY N/A 12/30/2016   Procedure: Lower Extremity Angiography;  Surgeon: Waynetta Sandy, MD;  Location: [place] [facility];  Service: Cardiovascular;  Laterality: N/A;  . LUMBAR SPINE SURGERY    . PERIPHERAL VASCULAR ATHERECTOMY Right 12/30/2016   Procedure: Peripheral Vascular Atherectomy;  Surgeon: Waynetta Sandy, MD;  Location: [place] [facility] LAB;  Service: Cardiovascular;  Laterality: Right;  AT and PT  . PERIPHERAL VASCULAR BALLOON ANGIOPLASTY Right 12/30/2016   Procedure: Peripheral Vascular Balloon Angioplasty;  Surgeon: Waynetta Sandy, MD;  Location: [place] [facility] LAB;  Service: Cardiovascular;  Laterality: Right;  PTA of PT and AT  . PERIPHERAL VASCULAR BALLOON ANGIOPLASTY  02/23/2017   Procedure: Peripheral Vascular Balloon Angioplasty;  Surgeon: [name], MD;  Location: [place] CV LAB;  Service: Cardiovascular;;  RT. Anterior Tib.  [name] PERIPHERAL VASCULAR BALLOON ANGIOPLASTY  04/08/2017   Procedure: PERIPHERAL VASCULAR BALLOON ANGIOPLASTY;  Surgeon: Conrad [place], MD;  Location: [place] CV LAB;  Service: Cardiovascular;;  . PERIPHERAL VASCULAR CATHETERIZATION N/A 01/24/2015   Procedure: Fistulagram;  Surgeon: Conrad [place], MD;  Location: [place] CV LAB;  Service: Cardiovascular;  Laterality: N/A;  . PERIPHERAL VASCULAR CATHETERIZATION Right 06/04/2016   Procedure: A/V Shuntogram/Fistulagram;  Surgeon: Conrad [place], MD;  Location: [place] [facility];  Service: Cardiovascular;  Laterality: Right;  . REVISON OF ARTERIOVENOUS FISTULA Right 01/02/2014   Procedure: REVISON OF ARTERIOVENOUS FISTULA ANASTOMOSIS;  Surgeon: Conrad [place], MD;  Location: [place];  Service: Vascular;  Laterality: Right;  . REVISON OF ARTERIOVENOUS FISTULA Right 01/02/2016   Procedure: REVISION OF RADIOCEPHALIC ARTERIOVENOUS FISTULA  with BOVINE PATCH ANGIOPLASTY RIGHT RADIAL ARTERY;  Surgeon: Mal [name], MD;  Location: [place];  Service: Vascular;  Laterality: Right;  . SHUNTOGRAM N/A 11/07/2013   Procedure: Fistulogram;  Surgeon: [name], MD;  Location: [facility] CATH LAB;  Service: Cardiovascular;  Laterality: N/A;  . TRANSMETATARSAL AMPUTATION Right 01/01/2017   Procedure: TRANSMETATARSAL AMPUTATION;  Surgeon: [name], MD;  Location: [facility] OR;  Service: Vascular;  Laterality: Right;     reports that he has never smoked. He has never used smokeless tobacco. He reports that he drinks alcohol. He reports that he does not use drugs.  Allergies  Allergen  Reactions  . Bee Venom Anaphylaxis  . Iodine Swelling  . Penicillins Swelling and Other (See Comments)    SWELLING REACTION UNSPECIFIED   Has patient had a PCN reaction causing immediate rash, facial/tongue/throat swelling, SOB or lightheadedness with hypotension: No Has patient had a PCN reaction causing severe rash involving mucus membranes or skin necrosis: No Has patient had a PCN reaction that required hospitalization No Has patient had a PCN reaction occurring within the last 10 years: No If all of the above answers are "NO", then may proceed with Cephalosporin use.  . Sulfadiazine Other (See Comments)    1% Silver Sulfadiazine cream causes burning over a large area of skin.  [name] Plavix  [Clopidogrel] Other (See Comments)    Caused Nose bleed    Family History  Problem Relation Age of Onset  . Heart attack Brother        43  . Heart disease Brother        before age 13  . Hypertension Brother   . Heart attack Brother        26  . Heart attack Brother        23  . Diabetes Mother   . Hypertension Mother   . Heart disease Mother   . Heart disease Father   . Hypertension Father   . Other Father        amputation  . Hypertension Sister   . Heart disease Sister   . Vision loss Maternal Uncle     Prior to Admission medications   Medication Sig Start Date End Date Taking? Authorizing Provider  acetaminophen (TYLENOL) 500 MG tablet Take 1,000 mg by mouth every 6 (six) hours as needed for mild pain.    [provider]  aspirin 81 MG tablet Take 1 tablet (81 mg total) by mouth daily. 04/08/17   Conrad Allen, MD  aspirin EC 81 MG tablet Take 1 tablet (81 mg total) by mouth daily. 03/02/17 05/31/17  Janece Canterbury, MD  atorvastatin (LIPITOR) 40 MG tablet Take 1 tablet (40 mg total) by mouth daily at 6 PM. 02/26/17   Janece Canterbury, MD  B Complex-C-Folic Acid (DIALYVITE TABLET) TABS Take 1 tablet by mouth every Monday, Wednesday, and Friday.     [provider]  bisacodyl (DULCOLAX) 5 MG EC tablet Take 2 tablets (10 mg total) by mouth daily as needed for moderate constipation. 01/04/17   Mikhail, Velta Addison, DO  carvedilol (COREG) 12.5 MG tablet Take 1 tablet (12.5 mg total) by mouth 2 (two) times daily with a meal. 2 times per day on Sun, Tu, Thur, and Sat.  Hold on dialysis days. 03/02/17   Janece Canterbury, MD  cinacalcet (SENSIPAR) 30 MG tablet Take 30 mg by mouth daily.    [provider]  EPIPEN 2-PAK 0.3 MG/0.3ML SOAJ injection Inject 0.3 mg into the muscle once as needed (For anaphylaxis.).  04/18/13   [provider]  finasteride (PROSCAR) 5 MG tablet Take 1 tablet (5 mg total) by mouth daily. 02/27/17   Janece Canterbury, MD    HYDROcodone-acetaminophen (NORCO/VICODIN) 5-325 MG tablet Take 1 tablet by mouth every 4 (four) hours as needed for moderate pain. 02/26/17   Janece Canterbury, MD  LANTUS SOLOSTAR 100 UNIT/ML Solostar Pen Inject 15 Units into the skin at bedtime. 03/02/17   Janece Canterbury, MD  omeprazole (PRILOSEC) 40 MG capsule Take 40 mg by mouth daily.     [provider]  polyethylene glycol powder (GLYCOLAX/MIRALAX) powder Take  17 g by mouth daily. 02/26/17   Janece Canterbury, MD  RENVELA 800 MG tablet Take 800-2,400 mg by mouth 3 (three) times daily with meals. Take 2400 mg by mouth 3 times daily with meals and 800 mg by mouth with snacks 05/25/13   [provider]  senna (SENOKOT) 8.6 MG TABS tablet Take 2 tablets (17.2 mg total) by mouth at bedtime. 02/26/17   Janece Canterbury, MD  vancomycin (VANCOCIN) 1-5 GM/200ML-% SOLN Inject 200 mLs (1,000 mg total) into the vein every Monday, Wednesday, and Friday with hemodialysis. 02/26/17   Janece Canterbury, MD    Physical Exam: Vitals:   04/09/17 0200 04/09/17 0245 04/09/17 0330 04/09/17 0348  BP: (!) 152/48 (!) 157/82 (!) 182/83 (!) 190/93  Pulse: (!) 54 64  63  Resp: 17 16  12   Temp:      TempSrc:      SpO2: 100% 100%  100%  Weight:      Height:        Constitutional: NAD, calm, comfortable Vitals:   04/09/17 0200 04/09/17 0245 04/09/17 0330 04/09/17 0348  BP: (!) 152/48 (!) 157/82 (!) 182/83 (!) 190/93  Pulse: (!) 54 64  63  Resp: 17 16  12   Temp:      TempSrc:      SpO2: 100% 100%  100%  Weight:      Height:       Eyes: PERRL, lids and conjunctivae normal ENMT: Mucous membranes are moist. Posterior pharynx clear of any exudate or lesions.Normal dentition.  Neck: normal, supple, no masses, no thyromegaly Respiratory: clear to auscultation bilaterally, no wheezing, no crackles. Normal respiratory effort. No accessory muscle use.  Cardiovascular: Regular rate and rhythm, no murmurs / rubs / gallops. No extremity edema. No  carotid bruits.  Abdomen: no tenderness, no masses palpated. No hepatosplenomegaly. Bowel sounds positive.  Musculoskeletal: no clubbing / cyanosis. Right bka.  Right groin area painful with hematoma Skin: no rashes, lesions, ulcers. No induration Neurologic: CN 2-12 grossly intact. Sensation intact, DTR normal. Strength 5/5 in all 4.  Psychiatric: Normal judgment and insight. Alert and oriented x 3. Normal mood.    Labs on Admission: I have personally reviewed following labs and imaging studies  CBC:  Recent Labs Lab 04/08/17 0700 04/09/17 0101 04/09/17 0113  WBC  --  6.2  --   NEUTROABS  --  5.6  --   HGB 9.5* 8.5* 10.2*  HCT 28.0* 28.0* 30.0*  MCV  --  85.9  --   PLT  --  255  --    Basic Metabolic Panel:  Recent Labs Lab 04/08/17 0700 04/09/17 0101 04/09/17 0113 04/09/17 0319  NA 136 128* 132*  --   K 5.4* 6.5* 6.6* 5.7*  CL 98* 95* 97*  --   CO2  --  22  --   --   GLUCOSE 153* 346* 349*  --   BUN 71* 88* 80*  --   CREATININE 11.00* 10.70* 11.50*  --   CALCIUM  --  8.4*  --   --    GFR: Estimated Creatinine Clearance: 8.7 mL/min (A) (by C-G formula based on SCr of 11.5 mg/dL (H)). Liver Function Tests:  Recent Labs Lab 04/09/17 0101  AST 17  ALT 13*  ALKPHOS 156*  BILITOT 0.6  PROT 8.7*  ALBUMIN 2.7*   Coagulation Profile:  Recent Labs Lab 04/09/17 0101  INR 1.71   CBG:  Recent Labs Lab 04/08/17 0947 04/08/17 1253  GLUCAP 161*  194*    Radiological Exams on Admission: Dg Chest Portable 1 View  Result Date: 04/09/2017 CLINICAL DATA:  Shortness of breath this morning EXAM: PORTABLE CHEST 1 VIEW COMPARISON:  07/28/2016 FINDINGS: Shallow inspiration. Cardiac enlargement. Mild pulmonary vascular congestion. No edema or consolidation. No blunting of costophrenic angles. No pneumothorax. Vascular stent in the left infraclavicular region. IMPRESSION: Cardiac enlargement with mild pulmonary vascular congestion. No edema or consolidation.  Electronically Signed   By: Lucienne Capers M.D.   On: 04/09/2017 02:17   Ct Angio Abd/pel W And/or Wo Contrast  Result Date: 04/09/2017 CLINICAL DATA:  Thigh or groin pain. Right lower abdominal pain. Nausea, vomiting and diarrhea. Post aortic angiogram and balloon angioplasty earlier today. EXAM: CTA ABDOMEN AND PELVIS wITHOUT AND WITH CONTRAST TECHNIQUE: Multidetector CT imaging of the abdomen and pelvis was performed using the standard protocol during bolus administration of intravenous contrast. Multiplanar reconstructed images and MIPs were obtained and reviewed to evaluate the vascular anatomy. CONTRAST:  100 cc Isovue 370 IV COMPARISON:  None. FINDINGS: VASCULAR Aorta: Normal caliber aorta without aneurysm, dissection, or significant stenosis. Moderate atherosclerosis. There is diffuse retroperitoneal edema. Celiac: Patent without evidence of aneurysm, dissection, vasculitis or significant stenosis. SMA: Patent without evidence of aneurysm, dissection, vasculitis or significant stenosis. Renals: Single right and 2 left renal arteries are patent without evidence of aneurysm, dissection, vasculitis, fibromuscular dysplasia or significant stenosis. IMA: Patent with atherosclerosis at the origin.  No dissection. Inflow: Common and external iliac arteries are patent with atherosclerosis. There is stenosis of the right internal iliac artery with greater than 50% without complete occlusion. There is stenosis of the left internal iliac artery of greater than 50%. Proximal Outflow: Right femoral access for aortogram. Moderate femoral groin hematoma/hemorrhage with soft tissue stranding and extraluminal arterial extravasation, at least 3 focal arterial outpouchings measuring up to 12 mm extend to the skin surface. This extends extraluminally from the common femoral artery without the classic appearance of a pseudoaneurysm. Subtle vessel extravasation identified image 165 series 4. Right common femoral artery  distal is patent. Proximal left femoral artery is patent with atherosclerosis. Bilateral profunda femorals are patent. Veins: No obvious venous abnormality within the limitations of this arterial phase study, significantly limited venous evaluation. Review of the MIP images confirms the above findings. NON-VASCULAR Lower chest: Multi chamber cardiomegaly. No pleural fluid. Mild dependent atelectasis. Hepatobiliary: No focal hepatic lesion allowing for arterial phase imaging. The gallbladder is physiologically distended. No calcified gallstone. Pancreas: Mild retroperitoneal edema, not disproportionate to the pancreas. No ductal dilatation. Spleen: Normal arterial phase enhancement.  Normal in size. Adrenals/Urinary Tract: No adrenal nodule. Thinning of bilateral renal parenchyma with multiple low-density lesions, incompletely characterized on arterial phase study. There is bilateral perinephric edema. No hydronephrosis. Urinary bladder is completely decompressed but thick walled. There is perivesicular edema. Stomach/Bowel: Stomach is nondistended. No definite bowel inflammation. No bowel obstruction. Normal appendix. Moderate colonic stool burden. Lymphatic: There prominent right inguinal nodes measuring up to 14 mm that may be reactive. Multiple small retroperitoneal nodes, for example the level of left renal artery measuring 10 mm. Reproductive: Prostate is unremarkable. Other: There is diffuse retroperitoneal edema. Diffuse mesenteric edema to lesser extent and small amount of free fluid in the pericolic gutters. Fluid in the pericolic gutters is complex. There is moderate whole body wall edema. Small umbilical hernia containing fat and small amount of free fluid. Musculoskeletal: Degenerative change in the spine is most prominent at L5-S1. IMPRESSION: VASCULAR 1. Post right common femoral artery  access for peripheral vascular intervention. Associated right groin hematoma soft tissue stranding and foci of active  extravasation extending towards the skin surface. 2. Diffuse atherosclerosis of the abdominal aorta and branches. Stenosis of bilateral internal iliac arteries. NON-VASCULAR 1. There is diffuse retroperitoneal edema of uncertain significance. Diffuse mesenteric and body wall edema. Small amount of complex free fluid in both pericolic gutters, of unknown underlying etiology but possibly related to recent vascular procedure. 2. Decompressed urinary bladder with wall thickening and perivesicular inflammation. There is bilateral perinephric edema of uncertain chronicity. Patient with end-stage renal disease on dialysis. 3. Multiple low-density lesions throughout both kidneys incompletely characterized. 4. Mild right inguinal and retroperitoneal adenopathy, consider follow-up imaging after resolution of acute event to evaluate for stability/resolution. These results were called by telephone at the time of interpretation on 04/09/2017 at 2:25 am to Dr. Ezequiel Essex , who verbally acknowledged these results. Electronically Signed   By: Jeb Levering M.D.   On: 04/09/2017 02:26    Assessment/Plan 66 yo male s/p angioplasty comes in with enlarging right groin hematoma  Principal Problem:   Hematoma of groin- pressure applied.  hgb stable.  Transfer [facility]  Dr Bridgett Larsson aware.    Active Problems:   Nonischemic cardiomyopathy ([place])- stable,hold bblocker due to bradycardia   End-stage renal disease on hemodialysis ([place])- k level improved, needs dialysis today.  If pt still in this ED in am, have placed befakado on MD list   Essential hypertension, benign- stable   Hyperkalemia- improving, will need routine dialysis today   Insulin-requiring or dependent type II diabetes mellitus ([place])- SSI   Anemia of chronic disease- stable   Bradycardia- holding bblocker   Med list pending   DVT prophylaxis:  scd Code Status:  full Family Communication:  none  Disposition Plan:  Per day team Consults called:   Vascular and nephrology Admission status:  observation   Kacelyn Rowzee A MD [facility]  If 7PM-7AM, please contact night-coverage [email or web address] Password [email or web address]  04/09/2017, 4:09 AM

## 2017-04-09 NOTE — Progress Notes (Addendum)
[facility] [place]   RDE:YCXKG Jacob [name] is a 66 y.o. male who is s/p right BKA on 02-28-17 by Dr. Donnetta Hutching for nonhealing right transmetatarsal amputation with MRSA sepsis.  He was seen in our office on 04/01/2017.  On exam Left heel and medial aspect great toe ulcer. Shiny appearance of left calf.  Left ABI study with monophasic flow falsely elevated 1.04 due to calcification.  He was taken to the [facility] lab by Dr. Bridgett Larsson 04/08/2017:  Conclusion     Patent aortoiliac segment  Patent left common femoral and profunda femoral arteries  Patent left superficial femoral artery with serial stenoses 50%  Sub-total occlusion of left tibioperoneal trunk with occluded proximal posterior tibial and peroneal arteries  Patent proximal left anterior tibial artery with occlusion of distal anterior tibial artery: reconstituted by angioplasty  Proximal left dorsal pedis artery stenosis >75%: sub-optimal response to angioplasty with >30% stenosis residual. Further intervention not possible due to poor patient cooperation  Improved left foot perfusion via peroneal and anterior tibial/dorsalis pedis arteries at end of case     Subjective  - [facility] with a cc of abdominal pain/right groin, N/V.  CT scan of abd/pelvis showed   Proximal Outflow: Right femoral access for aortogram. Moderate femoral groin hematoma/hemorrhage with soft tissue stranding and extraluminal arterial extravasation, at least 3 focal arterial outpouchings measuring up to 12 mm extend to the skin surface. This extends extraluminally from the common femoral artery without the classic appearance of a pseudoaneurysm. Subtle vessel extravasation identified image 165 series 4. Right common femoral artery distal is patent. Proximal left femoral artery is patent with atherosclerosis. Bilateral profunda femorals are patent.  Objective (!) 147/67 61 97.7 F (36.5 C) (Oral) 14 100% No intake or output  data in the 24 hours ending 04/09/17 0750  Right groin with firm edema non expanding.  No skin irritation or expansion. Tender to palpation right groin, no active external bleeding Left foot great toe ulcer and heal ulcer dry Doppler signals PT/DP/peroneal monophasic  CBC    Component Value Date/Time   WBC 8.1 04/09/2017 0737   RBC 3.48 (L) 04/09/2017 0737   HGB 8.7 (L) 04/09/2017 0737   HCT 29.3 (L) 04/09/2017 0737   PLT 271 04/09/2017 0737   MCV 84.2 04/09/2017 0737   MCH 25.0 (L) 04/09/2017 0737   MCHC 29.7 (L) 04/09/2017 0737   RDW 18.3 (H) 04/09/2017 0737   LYMPHSABS 0.3 (L) 04/09/2017 0101   MONOABS 0.3 04/09/2017 0101   EOSABS 0.0 04/09/2017 0101   BASOSABS 0.0 04/09/2017 0101     Assessment/Planning: POD #1 Right CFA access angiogram with intervention  Drop in HGB Pending cbc this am  Right groin hematoma controlled at this point,  It does not seem to be expanding at this point.  Pressure was held at [facility] ED.  Plan to observe and discuss with Dr. Bridgett Larsson.    [facility] 04/09/2017 7:50 AM --  Laboratory Lab Results:  Recent Labs  04/09/17 0101 04/09/17 0113  WBC 6.2  --   HGB 8.5* 10.2*  HCT 28.0* 30.0*  PLT 255  --    BMET  Recent Labs  04/09/17 0101 04/09/17 0113 04/09/17 0319  NA 128* 132*  --   K 6.5* 6.6* 5.7*  CL 95* 97*  --   CO2 22  --   --   GLUCOSE 346* 349*  --   BUN 88* 80*  --  CREATININE 10.70* 11.50*  --   CALCIUM 8.4*  --   --     COAG Lab Results  Component Value Date   INR 1.71 04/09/2017   INR 1.30 12/30/2016   No results found for: PTT  Addendum  No evidence of RPH.  No active bleeding from Jacob CFA, appears responsive to pressure to Jacob groin.  CTA demonstrates a small rebleed from anterior cannulation of Jacob CFA which decompressing in the skin.    - no intervention needed - Pt can follow up in 4 weeks.  My office will contact the patient.   [name], MD, FACS [facility]  [place] Office: [phone] Pager: [phone]  04/09/2017, 9:09 AM

## 2017-04-09 NOTE — ED Notes (Signed)
Attempted to call [name]. No answer.

## 2017-04-09 NOTE — Care Management Obs Status (Signed)
[facility] NOTIFICATION   Patient Details  Name: LENNIS KORB MRN: [id number] Date of Birth: February 22, 1951   Medicare Observation Status Notification Given:  Yes    Lataja Newland, Rory Percy, RN 04/09/2017, 11:52 AM

## 2017-04-09 NOTE — Consult Note (Signed)
HPI:  66 year old African-American man with history of insulin-dependent diabetes mellitus, hypertension, peripheral vascular disease, R BKA 02-28-17 and end-stage renal disease on hemodialysis (Monday/Wednesday/Friday in [place]). He also has a history of nonischemic cardiomyopathy and CVA in the past with residual right-sided arm weakness. Pt had a PTA 9/6 yesterday and had an angioplasty of the left dorsalis pedis, but presented to [facility] with right groin pain and swelling, potassium 6.6, HR in 40s, and hgb down to 8.5.  He was transferred to [facility] for further treatment.  Past Medical History:  Diagnosis Date  . Anemia   . Anemia in chronic kidney disease(285.21)   . Anxiety   . Arthritis   . Depression   . Dyspnea    with exertion  . ESRD (end stage renal disease) on dialysis [facility])    "MWF; [facility] [place]" (02/18/2017)  . Essential hypertension   . GERD (gastroesophageal reflux disease)   . Glaucoma   . History of blood transfusion   . Insomnia   . Nonischemic cardiomyopathy ([place])   . Stroke [facility]) 2016   - "they said it was not a stroke"  . Type 2 diabetes mellitus ([place])    Type II   Past Surgical History:  Procedure Laterality Date  . A/V FISTULAGRAM Right 10/06/2016   Procedure: A/V Fistulagram;  Surgeon: [name], MD;  Location: [place] CV LAB;  Service: Cardiovascular;  Laterality: Right;  . ABDOMINAL AORTOGRAM N/A 12/30/2016   Procedure: Abdominal Aortogram;  Surgeon: Waynetta Sandy, MD;  Location: [place] [facility];  Service: Cardiovascular;  Laterality: N/A;  . ABDOMINAL AORTOGRAM N/A 04/08/2017   Procedure: ABDOMINAL AORTOGRAM;  Surgeon: Conrad [place], MD;  Location: [place] [facility];  Service: Cardiovascular;  Laterality: N/A;  . ABDOMINAL AORTOGRAM W/LOWER EXTREMITY N/A 02/23/2017   Procedure: Abdominal Aortogram w/Lower Extremity;  Surgeon: [name], MD;  Location: [place] [facility];  Service: Cardiovascular;  Laterality: N/A;  Rt. leg  . AMPUTATION  Right 02/28/2017   Procedure: RIGHT BELOW KNEE AMPUTATION;  Surgeon: [name], MD;  Location: [place];  Service: Vascular;  Laterality: Right;  . AV FISTULA PLACEMENT     Hx: of  . AV FISTULA PLACEMENT Right 05/04/2013   Procedure: ARTERIOVENOUS (AV) FISTULA CREATION- RIGHT ARM;  Surgeon: Conrad [place], MD;  Location: [place];  Service: Vascular;  Laterality: Right;  Ultrasound guided  . AV FISTULA PLACEMENT Right 07/28/2016   Procedure: BRACHIOCEPHALIC ARTERIOVENOUS (AV) FISTULA CREATION;  Surgeon: [name], MD;  Location: [place];  Service: Vascular;  Laterality: Right;  . CATARACT EXTRACTION W/PHACO Left 07/16/2014   Procedure: CATARACT EXTRACTION PHACO AND INTRAOCULAR LENS PLACEMENT (IOC);  Surgeon: [name], MD;  Location: [facility];  Service: Ophthalmology;  Laterality: Left;  CDE:8.86  . CATARACT EXTRACTION W/PHACO Right 07/30/2014   Procedure: CATARACT EXTRACTION PHACO AND INTRAOCULAR LENS PLACEMENT (IOC);  Surgeon: [name], MD;  Location: [facility];  Service: Ophthalmology;  Laterality: Right;  CDE 8.99  . COLONOSCOPY     Hx: of  . FISTULA SUPERFICIALIZATION Right 07/28/2016   Procedure: FISTULA SUPERFICIALIZATION;  Surgeon: [name], MD;  Location: [place];  Service: Vascular;  Laterality: Right;  . FISTULA SUPERFICIALIZATION Right 10/13/2016   Procedure: BRACHIOCEPHALIC ARTERIOVENOUS FISTULA SUPERFICIALIZATION;  Surgeon: [name], MD;  Location: [place];  Service: Vascular;  Laterality: Right;  . FISTULOGRAM N/A 05/04/2013   Procedure: CENTRAL VENOGRAM;  Surgeon: Conrad [place], MD;  Location: [place];  Service: Vascular;  Laterality: N/A;  . INSERTION OF DIALYSIS CATHETER Right 05/04/2013   Procedure: INSERTION OF DIALYSIS CATHETER;  Surgeon: Fransisco Hertz, MD;  Location: [facility] OR;  Service: Vascular;  Laterality: Right;  Ultrasound guided  . INSERTION OF DIALYSIS CATHETER N/A 07/28/2016   Procedure: INSERTION OF DIALYSIS CATHETER;  Surgeon: Chuck Hint, MD;  Location: [facility] OR;  Service: Vascular;  Laterality: N/A;  . LIGATION OF ARTERIOVENOUS  FISTULA Left 05/04/2013   Procedure: LIGATION OF LEFT RADIAL CEPHALIC ARTERIOVENOUS  FISTULA;  Surgeon: Fransisco Hertz, MD;  Location: [facility] OR;  Service: Vascular;  Laterality: Left;  Ultrasound guided  . LIGATION OF ARTERIOVENOUS  FISTULA Right 07/28/2016   Procedure: LIGATION OF RADIOCEPHALIC ARTERIOVENOUS  FISTULA;  Surgeon: Chuck Hint, MD;  Location: [facility] OR;  Service: Vascular;  Laterality: Right;  . LOWER EXTREMITY ANGIOGRAPHY N/A 12/30/2016   Procedure: Lower Extremity Angiography;  Surgeon: Maeola Harman, MD;  Location: [facility] INVASIVE CV LAB;  Service: Cardiovascular;  Laterality: N/A;  . LUMBAR SPINE SURGERY    . PERIPHERAL VASCULAR ATHERECTOMY Right 12/30/2016   Procedure: Peripheral Vascular Atherectomy;  Surgeon: Maeola Harman, MD;  Location: [facility] INVASIVE CV LAB;  Service: Cardiovascular;  Laterality: Right;  AT and PT  . PERIPHERAL VASCULAR BALLOON ANGIOPLASTY Right 12/30/2016   Procedure: Peripheral Vascular Balloon Angioplasty;  Surgeon: Maeola Harman, MD;  Location: [facility] INVASIVE CV LAB;  Service: Cardiovascular;  Laterality: Right;  PTA of PT and AT  . PERIPHERAL VASCULAR BALLOON ANGIOPLASTY  02/23/2017   Procedure: Peripheral Vascular Balloon Angioplasty;  Surgeon: Nada Libman, MD;  Location: [facility] INVASIVE CV LAB;  Service: Cardiovascular;;  RT. Anterior Tib.  [name] PERIPHERAL VASCULAR BALLOON ANGIOPLASTY  04/08/2017   Procedure: PERIPHERAL VASCULAR BALLOON ANGIOPLASTY;  Surgeon: Fransisco Hertz, MD;  Location: [facility] INVASIVE CV LAB;  Service: Cardiovascular;;  . PERIPHERAL VASCULAR CATHETERIZATION N/A 01/24/2015   Procedure: Fistulagram;  Surgeon: Fransisco Hertz, MD;  Location: [facility] INVASIVE CV LAB;  Service: Cardiovascular;  Laterality: N/A;  . PERIPHERAL VASCULAR CATHETERIZATION Right 06/04/2016   Procedure: A/V Shuntogram/Fistulagram;  Surgeon: Fransisco Hertz, MD;   Location: [facility] INVASIVE CV LAB;  Service: Cardiovascular;  Laterality: Right;  . REVISON OF ARTERIOVENOUS FISTULA Right 01/02/2014   Procedure: REVISON OF ARTERIOVENOUS FISTULA ANASTOMOSIS;  Surgeon: Fransisco Hertz, MD;  Location: [facility] OR;  Service: Vascular;  Laterality: Right;  . REVISON OF ARTERIOVENOUS FISTULA Right 01/02/2016   Procedure: REVISION OF RADIOCEPHALIC ARTERIOVENOUS FISTULA  with BOVINE PATCH ANGIOPLASTY RIGHT RADIAL ARTERY;  Surgeon: Pryor Ochoa, MD;  Location: [facility] OR;  Service: Vascular;  Laterality: Right;  . SHUNTOGRAM N/A 11/07/2013   Procedure: Fistulogram;  Surgeon: Nada Libman, MD;  Location: [facility] CATH LAB;  Service: Cardiovascular;  Laterality: N/A;  . TRANSMETATARSAL AMPUTATION Right 01/01/2017   Procedure: TRANSMETATARSAL AMPUTATION;  Surgeon: Nada Libman, MD;  Location: [facility] OR;  Service: Vascular;  Laterality: Right;   Social History:  reports that he has never smoked. He has never used smokeless tobacco. He reports that he drinks alcohol. He reports that he does not use drugs. Allergies:  Allergies  Allergen Reactions  . Bee Venom Anaphylaxis  . Iodine Swelling  . Penicillins Swelling and Other (See Comments)    SWELLING REACTION UNSPECIFIED   Has patient had a PCN reaction causing immediate rash, facial/tongue/throat swelling, SOB or lightheadedness with hypotension: No Has patient had a PCN reaction causing severe rash involving mucus membranes or skin necrosis: No  Has patient had a PCN reaction that required hospitalization No Has patient had a PCN reaction occurring within the last 10 years: No If all of the above answers are "NO", then may proceed with Cephalosporin use.  . Sulfadiazine Other (See Comments)    1% Silver Sulfadiazine cream causes burning over a large area of skin.  [name] Plavix [Clopidogrel] Other (See Comments)    Caused Nose bleed   Family History  Problem Relation Age of Onset  . Heart attack Brother        47  . Heart disease Brother         before age 40  . Hypertension Brother   . Heart attack Brother        53  . Heart attack Brother        18  . Diabetes Mother   . Hypertension Mother   . Heart disease Mother   . Heart disease Father   . Hypertension Father   . Other Father        amputation  . Hypertension Sister   . Heart disease Sister   . Vision loss Maternal Uncle     Medications:  Scheduled: . atorvastatin  40 mg Oral q1800  . insulin aspart  0-9 Units Subcutaneous TID WC  . insulin glargine  15 Units Subcutaneous QHS  . sodium chloride flush  3 mL Intravenous Q12H   ROS: as per HPI Blood pressure 134/70, pulse 63, temperature 98 F (36.7 C), temperature source Oral, resp. rate 11, height '6\' 2"'$  (1.88 m), weight 119.5 kg (263 lb 7.2 oz), SpO2 100 %.  General appearance: alert and cooperative Head: Normocephalic, without obvious abnormality, atraumatic Throat: lips, mucosa, and tongue normal; teeth and gums normal Resp: clear to auscultation bilaterally Chest wall: no tenderness Cardio: regular rate and rhythm, S1, S2 normal, no murmur, click, rub or gallop GI: soft, non-tender; bowel sounds normal; no masses,  no organomegaly Extremities: R BKA with mild swelling and puncture site non bleeding Skin: Skin color, texture, turgor normal. No rashes or lesions Neurologic: Grossly normal Results for orders placed or performed during the hospital encounter of 04/09/17 (from the past 48 hour(s))  CBC with Differential/Platelet     Status: Abnormal   Collection Time: 04/09/17  1:01 AM  Result Value Ref Range   WBC 6.2 4.0 - 10.5 K/uL   RBC 3.26 (L) 4.22 - 5.81 MIL/uL   Hemoglobin 8.5 (L) 13.0 - 17.0 g/dL   HCT 28.0 (L) 39.0 - 52.0 %   MCV 85.9 78.0 - 100.0 fL   MCH 26.1 26.0 - 34.0 pg   MCHC 30.4 30.0 - 36.0 g/dL   RDW 18.1 (H) 11.5 - 15.5 %   Platelets 255 150 - 400 K/uL   Neutrophils Relative % 91 %   Neutro Abs 5.6 1.7 - 7.7 K/uL   Lymphocytes Relative 5 %   Lymphs Abs 0.3 (L) 0.7 - 4.0 K/uL    Monocytes Relative 4 %   Monocytes Absolute 0.3 0.1 - 1.0 K/uL   Eosinophils Relative 0 %   Eosinophils Absolute 0.0 0.0 - 0.7 K/uL   Basophils Relative 0 %   Basophils Absolute 0.0 0.0 - 0.1 K/uL  Comprehensive metabolic panel     Status: Abnormal   Collection Time: 04/09/17  1:01 AM  Result Value Ref Range   Sodium 128 (L) 135 - 145 mmol/L   Potassium 6.5 (HH) 3.5 - 5.1 mmol/L    Comment: CRITICAL RESULT CALLED TO,  READ BACK BY AND VERIFIED WITH: HAYMORE,R @ 0148 BY MATTHEWS, B 9.7.18    Chloride 95 (L) 101 - 111 mmol/L   CO2 22 22 - 32 mmol/L   Glucose, Bld 346 (H) 65 - 99 mg/dL   BUN 88 (H) 6 - 20 mg/dL   Creatinine, Ser 10.70 (H) 0.61 - 1.24 mg/dL   Calcium 8.4 (L) 8.9 - 10.3 mg/dL   Total Protein 8.7 (H) 6.5 - 8.1 g/dL   Albumin 2.7 (L) 3.5 - 5.0 g/dL   AST 17 15 - 41 U/L   ALT 13 (L) 17 - 63 U/L   Alkaline Phosphatase 156 (H) 38 - 126 U/L   Total Bilirubin 0.6 0.3 - 1.2 mg/dL   GFR calc non Af Amer 4 (L) >60 mL/min   GFR calc Af Amer 5 (L) >60 mL/min    Comment: (NOTE) The eGFR has been calculated using the CKD EPI equation. This calculation has not been validated in all clinical situations. eGFR's persistently <60 mL/min signify possible Chronic Kidney Disease.    Anion gap 11 5 - 15  Protime-INR     Status: Abnormal   Collection Time: 04/09/17  1:01 AM  Result Value Ref Range   Prothrombin Time 19.9 (H) 11.4 - 15.2 seconds   INR 1.71   Type and screen [facility]     Status: None   Collection Time: 04/09/17  1:01 AM  Result Value Ref Range   ABO/RH(D) O POS    Antibody Screen NEG    Sample Expiration 04/12/2017   I-stat chem 8, ed     Status: Abnormal   Collection Time: 04/09/17  1:13 AM  Result Value Ref Range   Sodium 132 (L) 135 - 145 mmol/L   Potassium 6.6 (HH) 3.5 - 5.1 mmol/L   Chloride 97 (L) 101 - 111 mmol/L   BUN 80 (H) 6 - 20 mg/dL   Creatinine, Ser 11.50 (H) 0.61 - 1.24 mg/dL   Glucose, Bld 349 (H) 65 - 99 mg/dL   Calcium, Ion 1.05  (L) 1.15 - 1.40 mmol/L   TCO2 22 22 - 32 mmol/L   Hemoglobin 10.2 (L) 13.0 - 17.0 g/dL   HCT 30.0 (L) 39.0 - 52.0 %   Comment NOTIFIED PHYSICIAN   Potassium     Status: Abnormal   Collection Time: 04/09/17  3:19 AM  Result Value Ref Range   Potassium 5.7 (H) 3.5 - 5.1 mmol/L  Basic metabolic panel     Status: Abnormal   Collection Time: 04/09/17  7:37 AM  Result Value Ref Range   Sodium 131 (L) 135 - 145 mmol/L   Potassium 5.8 (H) 3.5 - 5.1 mmol/L   Chloride 96 (L) 101 - 111 mmol/L   CO2 22 22 - 32 mmol/L   Glucose, Bld 234 (H) 65 - 99 mg/dL   BUN 88 (H) 6 - 20 mg/dL   Creatinine, Ser 11.23 (H) 0.61 - 1.24 mg/dL   Calcium 8.8 (L) 8.9 - 10.3 mg/dL   GFR calc non Af Amer 4 (L) >60 mL/min   GFR calc Af Amer 5 (L) >60 mL/min    Comment: (NOTE) The eGFR has been calculated using the CKD EPI equation. This calculation has not been validated in all clinical situations. eGFR's persistently <60 mL/min signify possible Chronic Kidney Disease.    Anion gap 13 5 - 15  CBC     Status: Abnormal   Collection Time: 04/09/17  7:37 AM  Result Value  Ref Range   WBC 8.1 4.0 - 10.5 K/uL   RBC 3.48 (L) 4.22 - 5.81 MIL/uL   Hemoglobin 8.7 (L) 13.0 - 17.0 g/dL   HCT 29.3 (L) 39.0 - 52.0 %   MCV 84.2 78.0 - 100.0 fL   MCH 25.0 (L) 26.0 - 34.0 pg   MCHC 29.7 (L) 30.0 - 36.0 g/dL   RDW 18.3 (H) 11.5 - 15.5 %   Platelets 271 150 - 400 K/uL  Glucose, capillary     Status: Abnormal   Collection Time: 04/09/17  7:56 AM  Result Value Ref Range   Glucose-Capillary 229 (H) 65 - 99 mg/dL   Comment 1 Notify RN    Comment 2 Document in Chart   MRSA PCR Screening     Status: None   Collection Time: 04/09/17 10:55 AM  Result Value Ref Range   MRSA by PCR NEGATIVE NEGATIVE    Comment:        The GeneXpert MRSA Assay (FDA approved for NASAL specimens only), is one component of a comprehensive MRSA colonization surveillance program. It is not intended to diagnose MRSA infection nor to guide  or monitor treatment for MRSA infections.    Dg Chest Portable 1 View  Result Date: 04/09/2017 CLINICAL DATA:  Shortness of breath this morning EXAM: PORTABLE CHEST 1 VIEW COMPARISON:  07/28/2016 FINDINGS: Shallow inspiration. Cardiac enlargement. Mild pulmonary vascular congestion. No edema or consolidation. No blunting of costophrenic angles. No pneumothorax. Vascular stent in the left infraclavicular region. IMPRESSION: Cardiac enlargement with mild pulmonary vascular congestion. No edema or consolidation. Electronically Signed   By: Lucienne Capers M.D.   On: 04/09/2017 02:17   Ct Angio Abd/pel W And/or Wo Contrast  Result Date: 04/09/2017 CLINICAL DATA:  Thigh or groin pain. Right lower abdominal pain. Nausea, vomiting and diarrhea. Post aortic angiogram and balloon angioplasty earlier today. EXAM: CTA ABDOMEN AND PELVIS wITHOUT AND WITH CONTRAST TECHNIQUE: Multidetector CT imaging of the abdomen and pelvis was performed using the standard protocol during bolus administration of intravenous contrast. Multiplanar reconstructed images and MIPs were obtained and reviewed to evaluate the vascular anatomy. CONTRAST:  100 cc Isovue 370 IV COMPARISON:  None. FINDINGS: VASCULAR Aorta: Normal caliber aorta without aneurysm, dissection, or significant stenosis. Moderate atherosclerosis. There is diffuse retroperitoneal edema. Celiac: Patent without evidence of aneurysm, dissection, vasculitis or significant stenosis. SMA: Patent without evidence of aneurysm, dissection, vasculitis or significant stenosis. Renals: Single right and 2 left renal arteries are patent without evidence of aneurysm, dissection, vasculitis, fibromuscular dysplasia or significant stenosis. IMA: Patent with atherosclerosis at the origin.  No dissection. Inflow: Common and external iliac arteries are patent with atherosclerosis. There is stenosis of the right internal iliac artery with greater than 50% without complete occlusion. There is  stenosis of the left internal iliac artery of greater than 50%. Proximal Outflow: Right femoral access for aortogram. Moderate femoral groin hematoma/hemorrhage with soft tissue stranding and extraluminal arterial extravasation, at least 3 focal arterial outpouchings measuring up to 12 mm extend to the skin surface. This extends extraluminally from the common femoral artery without the classic appearance of a pseudoaneurysm. Subtle vessel extravasation identified image 165 series 4. Right common femoral artery distal is patent. Proximal left femoral artery is patent with atherosclerosis. Bilateral profunda femorals are patent. Veins: No obvious venous abnormality within the limitations of this arterial phase study, significantly limited venous evaluation. Review of the MIP images confirms the above findings. NON-VASCULAR Lower chest: Multi chamber cardiomegaly. No pleural fluid.  Mild dependent atelectasis. Hepatobiliary: No focal hepatic lesion allowing for arterial phase imaging. The gallbladder is physiologically distended. No calcified gallstone. Pancreas: Mild retroperitoneal edema, not disproportionate to the pancreas. No ductal dilatation. Spleen: Normal arterial phase enhancement.  Normal in size. Adrenals/Urinary Tract: No adrenal nodule. Thinning of bilateral renal parenchyma with multiple low-density lesions, incompletely characterized on arterial phase study. There is bilateral perinephric edema. No hydronephrosis. Urinary bladder is completely decompressed but thick walled. There is perivesicular edema. Stomach/Bowel: Stomach is nondistended. No definite bowel inflammation. No bowel obstruction. Normal appendix. Moderate colonic stool burden. Lymphatic: There prominent right inguinal nodes measuring up to 14 mm that may be reactive. Multiple small retroperitoneal nodes, for example the level of left renal artery measuring 10 mm. Reproductive: Prostate is unremarkable. Other: There is diffuse  retroperitoneal edema. Diffuse mesenteric edema to lesser extent and small amount of free fluid in the pericolic gutters. Fluid in the pericolic gutters is complex. There is moderate whole body wall edema. Small umbilical hernia containing fat and small amount of free fluid. Musculoskeletal: Degenerative change in the spine is most prominent at L5-S1. IMPRESSION: VASCULAR 1. Post right common femoral artery access for peripheral vascular intervention. Associated right groin hematoma soft tissue stranding and foci of active extravasation extending towards the skin surface. 2. Diffuse atherosclerosis of the abdominal aorta and branches. Stenosis of bilateral internal iliac arteries. NON-VASCULAR 1. There is diffuse retroperitoneal edema of uncertain significance. Diffuse mesenteric and body wall edema. Small amount of complex free fluid in both pericolic gutters, of unknown underlying etiology but possibly related to recent vascular procedure. 2. Decompressed urinary bladder with wall thickening and perivesicular inflammation. There is bilateral perinephric edema of uncertain chronicity. Patient with end-stage renal disease on dialysis. 3. Multiple low-density lesions throughout both kidneys incompletely characterized. 4. Mild right inguinal and retroperitoneal adenopathy, consider follow-up imaging after resolution of acute event to evaluate for stability/resolution. These results were called by telephone at the time of interpretation on 04/09/2017 at 2:25 am to Dr. Ezequiel Essex , who verbally acknowledged these results. Electronically Signed   By: Jeb Levering M.D.   On: 04/09/2017 02:26    Assessment:  1 ESRD 2 Hyperkalemia, symptomatic 3 ABLA, post procedure Plan: 1 Urgent HD  Eutha Cude C 04/09/2017, 3:45 PM

## 2017-04-09 NOTE — Progress Notes (Signed)
Daily Progress Note  Radiology     Dg Chest Portable 1 View  Result Date: 04/09/2017 CLINICAL DATA:  Shortness of breath this morning EXAM: PORTABLE CHEST 1 VIEW COMPARISON:  07/28/2016 FINDINGS: Shallow inspiration. Cardiac enlargement. Mild pulmonary vascular congestion. No edema or consolidation. No blunting of costophrenic angles. No pneumothorax. Vascular stent in the left infraclavicular region. IMPRESSION: Cardiac enlargement with mild pulmonary vascular congestion. No edema or consolidation. Electronically Signed   By: Lucienne Capers M.D.   On: 04/09/2017 02:17   Ct Angio Abd/pel W And/or Wo Contrast  Result Date: 04/09/2017 CLINICAL DATA:  Thigh or groin pain. Right lower abdominal pain. Nausea, vomiting and diarrhea. Post aortic angiogram and balloon angioplasty earlier today. EXAM: CTA ABDOMEN AND PELVIS wITHOUT AND WITH CONTRAST TECHNIQUE: Multidetector CT imaging of the abdomen and pelvis was performed using the standard protocol during bolus administration of intravenous contrast. Multiplanar reconstructed images and MIPs were obtained and reviewed to evaluate the vascular anatomy. CONTRAST:  100 cc Isovue 370 IV COMPARISON:  None. FINDINGS: VASCULAR Aorta: Normal caliber aorta without aneurysm, dissection, or significant stenosis. Moderate atherosclerosis. There is diffuse retroperitoneal edema. Celiac: Patent without evidence of aneurysm, dissection, vasculitis or significant stenosis. SMA: Patent without evidence of aneurysm, dissection, vasculitis or significant stenosis. Renals: Single right and 2 left renal arteries are patent without evidence of aneurysm, dissection, vasculitis, fibromuscular dysplasia or significant stenosis. IMA: Patent with atherosclerosis at the origin.  No dissection. Inflow: Common and external iliac arteries are patent with atherosclerosis. There is stenosis of the right internal iliac artery with greater than 50% without complete occlusion. There is  stenosis of the left internal iliac artery of greater than 50%. Proximal Outflow: Right femoral access for aortogram. Moderate femoral groin hematoma/hemorrhage with soft tissue stranding and extraluminal arterial extravasation, at least 3 focal arterial outpouchings measuring up to 12 mm extend to the skin surface. This extends extraluminally from the common femoral artery without the classic appearance of a pseudoaneurysm. Subtle vessel extravasation identified image 165 series 4. Right common femoral artery distal is patent. Proximal left femoral artery is patent with atherosclerosis. Bilateral profunda femorals are patent. Veins: No obvious venous abnormality within the limitations of this arterial phase study, significantly limited venous evaluation. Review of the MIP images confirms the above findings. NON-VASCULAR Lower chest: Multi chamber cardiomegaly. No pleural fluid. Mild dependent atelectasis. Hepatobiliary: No focal hepatic lesion allowing for arterial phase imaging. The gallbladder is physiologically distended. No calcified gallstone. Pancreas: Mild retroperitoneal edema, not disproportionate to the pancreas. No ductal dilatation. Spleen: Normal arterial phase enhancement.  Normal in size. Adrenals/Urinary Tract: No adrenal nodule. Thinning of bilateral renal parenchyma with multiple low-density lesions, incompletely characterized on arterial phase study. There is bilateral perinephric edema. No hydronephrosis. Urinary bladder is completely decompressed but thick walled. There is perivesicular edema. Stomach/Bowel: Stomach is nondistended. No definite bowel inflammation. No bowel obstruction. Normal appendix. Moderate colonic stool burden. Lymphatic: There prominent right inguinal nodes measuring up to 14 mm that may be reactive. Multiple small retroperitoneal nodes, for example the level of left renal artery measuring 10 mm. Reproductive: Prostate is unremarkable. Other: There is diffuse  retroperitoneal edema. Diffuse mesenteric edema to lesser extent and small amount of free fluid in the pericolic gutters. Fluid in the pericolic gutters is complex. There is moderate whole body wall edema. Small umbilical hernia containing fat and small amount of free fluid. Musculoskeletal: Degenerative change in the spine is most prominent at L5-S1. IMPRESSION: VASCULAR 1. Post right common  femoral artery access for peripheral vascular intervention. Associated right groin hematoma soft tissue stranding and foci of active extravasation extending towards the skin surface. 2. Diffuse atherosclerosis of the abdominal aorta and branches. Stenosis of bilateral internal iliac arteries. NON-VASCULAR 1. There is diffuse retroperitoneal edema of uncertain significance. Diffuse mesenteric and body wall edema. Small amount of complex free fluid in both pericolic gutters, of unknown underlying etiology but possibly related to recent vascular procedure. 2. Decompressed urinary bladder with wall thickening and perivesicular inflammation. There is bilateral perinephric edema of uncertain chronicity. Patient with end-stage renal disease on dialysis. 3. Multiple low-density lesions throughout both kidneys incompletely characterized. 4. Mild right inguinal and retroperitoneal adenopathy, consider follow-up imaging after resolution of acute event to evaluate for stability/resolution. These results were called by telephone at the time of interpretation on 04/09/2017 at 2:25 am to Dr. Ezequiel Essex , who verbally acknowledged these results. Electronically Signed   By: Jeb Levering M.D.   On: 04/09/2017 02:26   - pt's R CFA cannulation completed under ultrasound guidance without single anterior wall cannulation without any complications - Pt had Left ATA recannulation which required upsizing R CFA sheath to 6-Fr. - CTA demonstrates decompressing hematoma probably from a small bleed.  There is no RPH present. - Pt needs pressure  held to R groin to compress the R CFA for 15-30 minutes.  Discussed with ED.  Pressure has already been held reportedly - Will look at pt once he transfer to [facility] for urgent HD for K 6.6   [name], MD, [facility] [place] Office: [phone] Pager: [phone]  04/09/2017, 4:31 AM

## 2017-04-09 NOTE — ED Notes (Signed)
Patient transported to CT 

## 2017-04-09 NOTE — ED Notes (Signed)
Date and time results received: 04/09/17  0148 (use smartphrase ".now" to insert current time)  Test: Potassium Critical Value: 6.5  Name of Provider Notified: Rancour  Orders Received? Or Actions Taken?: notified

## 2017-04-09 NOTE — ED Triage Notes (Signed)
Pt arrives from [facility] facility via [facility]. Per report, Pt was c/o lower right abdominal pain since having procedure at [facility] today. Pt has also had n/v/d. HR in 40's en route, pressure stable.

## 2017-04-09 NOTE — ED Provider Notes (Signed)
[facility] DEPT Provider Note   CSN: [id number] Arrival date & time: 04/09/17  0021     History   Chief Complaint Chief Complaint  Patient presents with  . Abdominal Pain    HPI BRYSYN BRANDENBERGER is a 66 y.o. male.  Patient presents from nursing home with a 5 hour history of right-sided abdominal and groin pain. He notably underwent aortogram with angioplasty of his left leg today by Dr. Bridgett Larsson. He states he did not have pain before this. His speech is somewhat slurred due to pain medication at the nursing home by EMS report. Patient is a dialysis patient last received treatment yesterday and is not making any urine. He denies any vomiting or diarrhea. Denies any chest pain or shortness of breath. Complains of pain to his right inguinal region and right lower quadrant. He still has an appendix and gallbladder. EMS reports he was confused with rates in the 40s   The history is provided by the patient and the EMS personnel.  Abdominal Pain   Associated symptoms include nausea and vomiting. Pertinent negatives include dysuria, hematuria, headaches, arthralgias and myalgias.    Past Medical History:  Diagnosis Date  . Anemia   . Anemia in chronic kidney disease(285.21)   . Anxiety   . Arthritis   . Depression   . Dyspnea    with exertion  . ESRD (end stage renal disease) on dialysis [facility])    "MWF; [facility] [place]" (02/18/2017)  . Essential hypertension   . GERD (gastroesophageal reflux disease)   . Glaucoma   . History of blood transfusion   . Insomnia   . Nonischemic cardiomyopathy ([place])   . Stroke [facility]) 2016   - "they said it was not a stroke"  . Type 2 diabetes mellitus ([place])    Type II    Patient Active Problem List   Diagnosis Date Noted  . Swelling of arm 03/18/2017  . PAD (peripheral artery disease) ([place])   . Anemia of chronic disease   . DNR (do not resuscitate) 02/24/2017  . Palliative care by specialist 02/24/2017  . Normocytic anemia 02/18/2017  . Hypokalemia  12/28/2016  . Insulin-requiring or dependent type II diabetes mellitus ([place]) 12/28/2016  . Gangrene of foot ([place]) 12/28/2016  . Gangrene of right foot ([place]) 12/28/2016  . Hyperkalemia 04/05/2016  . Inadequate dialysis 04/05/2016  . Other complications due to renal dialysis device, implant, and graft 04/28/2013  . Subclavian vein occlusion ([facility]) 04/28/2013  . Preoperative cardiovascular examination 12/28/2012  . Mixed hyperlipidemia 06/16/2011  . Nonischemic cardiomyopathy ([place]) 11/20/2010  . End-stage renal disease on hemodialysis ([place]) 11/20/2010  . Essential hypertension, benign 11/20/2010    Past Surgical History:  Procedure Laterality Date  . A/V FISTULAGRAM Right 10/06/2016   Procedure: A/V Fistulagram;  Surgeon: [name], MD;  Location: [place] [facility];  Service: Cardiovascular;  Laterality: Right;  . ABDOMINAL AORTOGRAM N/A 12/30/2016   Procedure: Abdominal Aortogram;  Surgeon: Waynetta Sandy, MD;  Location: [place] [facility];  Service: Cardiovascular;  Laterality: N/A;  . ABDOMINAL AORTOGRAM N/A 04/08/2017   Procedure: ABDOMINAL AORTOGRAM;  Surgeon: Conrad Box, MD;  Location: [place] [facility];  Service: Cardiovascular;  Laterality: N/A;  . ABDOMINAL AORTOGRAM W/LOWER EXTREMITY N/A 02/23/2017   Procedure: Abdominal Aortogram w/Lower Extremity;  Surgeon: [name], MD;  Location: [place] [facility];  Service: Cardiovascular;  Laterality: N/A;  Rt. leg  . AMPUTATION Right 02/28/2017   Procedure: RIGHT BELOW KNEE AMPUTATION;  Surgeon: [name], MD;  Location[facility];  Service: Vascular;  Laterality: Right;  . AV FISTULA PLACEMENT     Hx: of  . AV FISTULA PLACEMENT Right 05/04/2013   Procedure: ARTERIOVENOUS (AV) FISTULA CREATION- RIGHT ARM;  Surgeon: Conrad [place] R[place], MD;  Location: [place];  Service: Vascular;  Laterality: Right;  Ultrasound guided  . AV FISTULA PLACEMENT Right 07/28/2016   Procedure: BRACHIOCEPHALIC ARTERIOVENOUS (AV) FISTULA CREATION;   Surgeon: [name], MD;  Location: [place];  Service: Vascular;  Laterality: Right;  . CATARACT EXTRACTION W/PHACO Left 07/16/2014   Procedure: CATARACT EXTRACTION PHACO AND INTRAOCULAR LENS PLACEMENT (IOC);  Surgeon: [name], MD;  Location: [facility];  Service: Ophthalmology;  Laterality: Left;  CDE:8.86  . CATARACT EXTRACTION W/PHACO Right 07/30/2014   Procedure: CATARACT EXTRACTION PHACO AND INTRAOCULAR LENS PLACEMENT (IOC);  Surgeon: [name], MD;  Location: [facility];  Service: Ophthalmology;  Laterality: Right;  CDE 8.99  . COLONOSCOPY     Hx: of  . FISTULA SUPERFICIALIZATION Right 07/28/2016   Procedure: FISTULA SUPERFICIALIZATION;  Surgeon: [name], MD;  Location: [place];  Service: Vascular;  Laterality: Right;  . FISTULA SUPERFICIALIZATION Right 10/13/2016   Procedure: BRACHIOCEPHALIC ARTERIOVENOUS FISTULA SUPERFICIALIZATION;  Surgeon: [name], MD;  Location: [place];  Service: Vascular;  Laterality: Right;  . FISTULOGRAM N/A 05/04/2013   Procedure: CENTRAL VENOGRAM;  Surgeon: Conrad [place], MD;  Location: [place];  Service: Vascular;  Laterality: N/A;  . INSERTION OF DIALYSIS CATHETER Right 05/04/2013   Procedure: INSERTION OF DIALYSIS CATHETER;  Surgeon: Conrad [place], MD;  Location: [place];  Service: Vascular;  Laterality: Right;  Ultrasound guided  . INSERTION OF DIALYSIS CATHETER N/A 07/28/2016   Procedure: INSERTION OF DIALYSIS CATHETER;  Surgeon: [name], MD;  Location: [place];  Service: Vascular;  Laterality: N/A;  . LIGATION OF ARTERIOVENOUS  FISTULA Left 05/04/2013   Procedure: LIGATION OF LEFT RADIAL CEPHALIC ARTERIOVENOUS  FISTULA;  Surgeon: Conrad [place], MD;  Location: [place];  Service: Vascular;  Laterality: Left;  Ultrasound guided  . LIGATION OF ARTERIOVENOUS  FISTULA Right 07/28/2016   Procedure: LIGATION OF RADIOCEPHALIC ARTERIOVENOUS  FISTULA;  Surgeon: [name], MD;  Location: [place];  Service: Vascular;  Laterality:  Right;  . LOWER EXTREMITY ANGIOGRAPHY N/A 12/30/2016   Procedure: Lower Extremity Angiography;  Surgeon: Waynetta Sandy, MD;  Location: [place] [facility];  Service: Cardiovascular;  Laterality: N/A;  . LUMBAR SPINE SURGERY    . PERIPHERAL VASCULAR ATHERECTOMY Right 12/30/2016   Procedure: Peripheral Vascular Atherectomy;  Surgeon: Waynetta Sandy, MD;  Location: [place] [facility];  Service: Cardiovascular;  Laterality: Right;  AT and PT  . PERIPHERAL VASCULAR BALLOON ANGIOPLASTY Right 12/30/2016   Procedure: Peripheral Vascular Balloon Angioplasty;  Surgeon: Waynetta Sandy, MD;  Location: [place] [facility];  Service: Cardiovascular;  Laterality: Right;  PTA of PT and AT  . PERIPHERAL VASCULAR BALLOON ANGIOPLASTY  02/23/2017   Procedure: Peripheral Vascular Balloon Angioplasty;  Surgeon: [name], MD;  Location: [place] [facility];  Service: Cardiovascular;;  RT. Anterior Tib.  [name] PERIPHERAL VASCULAR BALLOON ANGIOPLASTY  04/08/2017   Procedure: PERIPHERAL VASCULAR BALLOON ANGIOPLASTY;  Surgeon: Conrad [place], MD;  Location: [place] [facility];  Service: Cardiovascular;;  . PERIPHERAL VASCULAR CATHETERIZATION N/A 01/24/2015   Procedure: Fistulagram;  Surgeon: Conrad [place], MD;  Location: [place] [facility];  Service: Cardiovascular;  Laterality: N/A;  .  PERIPHERAL VASCULAR CATHETERIZATION Right 06/04/2016   Procedure: A/V Shuntogram/Fistulagram;  Surgeon: Conrad [place], MD;  Location: [place] [facility];  Service: Cardiovascular;  Laterality: Right;  . REVISON OF ARTERIOVENOUS FISTULA Right 01/02/2014   Procedure: REVISON OF ARTERIOVENOUS FISTULA ANASTOMOSIS;  Surgeon: Conrad Ralston, MD;  Location: [place];  Service: Vascular;  Laterality: Right;  . REVISON OF ARTERIOVENOUS FISTULA Right 01/02/2016   Procedure: REVISION OF RADIOCEPHALIC ARTERIOVENOUS FISTULA  with BOVINE PATCH ANGIOPLASTY RIGHT RADIAL ARTERY;  Surgeon: Mal [name], MD;  Location: [place];  Service:  Vascular;  Laterality: Right;  . SHUNTOGRAM N/A 11/07/2013   Procedure: Fistulogram;  Surgeon: [name], MD;  Location: [facility] CATH LAB;  Service: Cardiovascular;  Laterality: N/A;  . TRANSMETATARSAL AMPUTATION Right 01/01/2017   Procedure: TRANSMETATARSAL AMPUTATION;  Surgeon: [name], MD;  Location: [facility] OR;  Service: Vascular;  Laterality: Right;       Home Medications    Prior to Admission medications   Medication Sig Start Date End Date Taking? Authorizing Provider  acetaminophen (TYLENOL) 500 MG tablet Take 1,000 mg by mouth every 6 (six) hours as needed for mild pain.    [provider]  aspirin 81 MG tablet Take 1 tablet (81 mg total) by mouth daily. 04/08/17   Conrad Delway, MD  aspirin EC 81 MG tablet Take 1 tablet (81 mg total) by mouth daily. 03/02/17 05/31/17  Janece Canterbury, MD  atorvastatin (LIPITOR) 40 MG tablet Take 1 tablet (40 mg total) by mouth daily at 6 PM. 02/26/17   Janece Canterbury, MD  B Complex-C-Folic Acid (DIALYVITE TABLET) TABS Take 1 tablet by mouth every Monday, Wednesday, and Friday.     [provider]  bisacodyl (DULCOLAX) 5 MG EC tablet Take 2 tablets (10 mg total) by mouth daily as needed for moderate constipation. 01/04/17   Mikhail, Velta Addison, DO  carvedilol (COREG) 12.5 MG tablet Take 1 tablet (12.5 mg total) by mouth 2 (two) times daily with a meal. 2 times per day on Sun, Tu, Thur, and Sat.  Hold on dialysis days. 03/02/17   Janece Canterbury, MD  cinacalcet (SENSIPAR) 30 MG tablet Take 30 mg by mouth daily.    [provider]  EPIPEN 2-PAK 0.3 MG/0.3ML SOAJ injection Inject 0.3 mg into the muscle once as needed (For anaphylaxis.).  04/18/13   [provider]  finasteride (PROSCAR) 5 MG tablet Take 1 tablet (5 mg total) by mouth daily. 02/27/17   Janece Canterbury, MD  HYDROcodone-acetaminophen (NORCO/VICODIN) 5-325 MG tablet Take 1 tablet by mouth every 4 (four) hours as needed for moderate pain. 02/26/17   Janece Canterbury, MD  LANTUS SOLOSTAR 100 UNIT/ML Solostar Pen Inject 15 Units into the skin at bedtime. 03/02/17   Janece Canterbury, MD  omeprazole (PRILOSEC) 40 MG capsule Take 40 mg by mouth daily.     [provider]  polyethylene glycol powder (GLYCOLAX/MIRALAX) powder Take 17 g by mouth daily. 02/26/17   Janece Canterbury, MD  RENVELA 800 MG tablet Take 800-2,400 mg by mouth 3 (three) times daily with meals. Take 2400 mg by mouth 3 times daily with meals and 800 mg by mouth with snacks 05/25/13   [provider]  senna (SENOKOT) 8.6 MG TABS tablet Take 2 tablets (17.2 mg total) by mouth at bedtime. 02/26/17   Janece Canterbury, MD  vancomycin (VANCOCIN) 1-5 GM/200ML-% SOLN Inject 200 mLs (1,000 mg total) into the vein every Monday, Wednesday, and Friday with hemodialysis. 02/26/17  Janece Canterbury, MD    Family History Family History  Problem Relation Age of Onset  . Heart attack Brother        73  . Heart disease Brother        before age 66  . Hypertension Brother   . Heart attack Brother        63  . Heart attack Brother        64  . Diabetes Mother   . Hypertension Mother   . Heart disease Mother   . Heart disease Father   . Hypertension Father   . Other Father        amputation  . Hypertension Sister   . Heart disease Sister   . Vision loss Maternal Uncle     Social History Social History  Substance Use Topics  . Smoking status: Never Smoker  . Smokeless tobacco: Never Used  . Alcohol use Yes     Comment: 1- fifth of gin a week     Allergies   Bee venom; Iodine; Penicillins; Sulfadiazine; and Plavix [clopidogrel]   Review of Systems Review of Systems  Constitutional: Negative for activity change, appetite change and fatigue.  HENT: Negative for congestion.   Eyes: Negative for visual disturbance.  Respiratory: Negative for cough, chest tightness and shortness of breath.   Cardiovascular: Negative for chest pain.  Gastrointestinal: Positive for  abdominal pain, nausea and vomiting.  Genitourinary: Negative for dysuria, hematuria, penile swelling, scrotal swelling and urgency.  Musculoskeletal: Negative for arthralgias and myalgias.  Neurological: Negative for dizziness, weakness and headaches.   all other systems are negative except as noted in the HPI and PMH.     Physical Exam Updated Vital Signs BP (!) 157/57 (BP Location: Left Arm)   Pulse (!) 43   Temp (!) 96.1 F (35.6 C) (Rectal)   Resp (!) 22   Ht 6\' 2"  (1.88 m)   Wt 124.3 kg (274 lb)   SpO2 98%   BMI 35.18 kg/m   Physical Exam  Constitutional: He is oriented to person, place, and time. He appears well-developed and well-nourished. No distress.  Somewhat confused, slurred speech  HENT:  Head: Normocephalic and atraumatic.  Mouth/Throat: Oropharynx is clear and moist. No oropharyngeal exudate.  Eyes: Pupils are equal, round, and reactive to light. Conjunctivae and EOM are normal.  Neck: Normal range of motion. Neck supple.  No meningismus.  Cardiovascular: Normal rate, normal heart sounds and intact distal pulses.   No murmur heard. bradycardic  Pulmonary/Chest: Effort normal and breath sounds normal. No respiratory distress.  Abdominal: Soft. There is tenderness. There is no rebound and no guarding.  Abdomen is soft, tender in the right lower quadrant with voluntary guarding. There is a small right inguinal hematoma  Genitourinary:  Genitourinary Comments: Testicles are nontender  Musculoskeletal: Normal range of motion. He exhibits no edema or tenderness.  Right BKA. Left DP and PT pulse present with Doppler. Fistula RUE with thrill  Neurological: He is alert and oriented to person, place, and time. No cranial nerve deficit. He exhibits normal muscle tone. Coordination normal.   5/5 strength throughout. CN 2-12 intact.Equal grip strength.   Skin: Skin is warm.  Psychiatric: He has a normal mood and affect. His behavior is normal.  Nursing note and  vitals reviewed.    ED Treatments / Results  Labs (all labs ordered are listed, but only abnormal results are displayed) Labs Reviewed  CBC WITH DIFFERENTIAL/PLATELET - Abnormal; Notable for the following:  Result Value   RBC 3.26 (*)    Hemoglobin 8.5 (*)    HCT 28.0 (*)    RDW 18.1 (*)    Lymphs Abs 0.3 (*)    All other components within normal limits  COMPREHENSIVE METABOLIC PANEL - Abnormal; Notable for the following:    Sodium 128 (*)    Potassium 6.5 (*)    Chloride 95 (*)    Glucose, Bld 346 (*)    BUN 88 (*)    Creatinine, Ser 10.70 (*)    Calcium 8.4 (*)    Total Protein 8.7 (*)    Albumin 2.7 (*)    ALT 13 (*)    Alkaline Phosphatase 156 (*)    GFR calc non Af Amer 4 (*)    GFR calc Af Amer 5 (*)    All other components within normal limits  PROTIME-INR - Abnormal; Notable for the following:    Prothrombin Time 19.9 (*)    All other components within normal limits  POTASSIUM - Abnormal; Notable for the following:    Potassium 5.7 (*)    All other components within normal limits  I-STAT CHEM 8, ED - Abnormal; Notable for the following:    Sodium 132 (*)    Potassium 6.6 (*)    Chloride 97 (*)    BUN 80 (*)    Creatinine, Ser 11.50 (*)    Glucose, Bld 349 (*)    Calcium, Ion 1.05 (*)    Hemoglobin 10.2 (*)    HCT 30.0 (*)    All other components within normal limits  URINALYSIS, ROUTINE W REFLEX MICROSCOPIC  TYPE AND SCREEN    EKG  EKG Interpretation  Date/Time:  Friday April 09 2017 00:32:10 EDT Ventricular Rate:  45 PR Interval:    QRS Duration: 147 QT Interval:  563 QTC Calculation: 488 R Axis:   -82 Text Interpretation:  Junctional rhythm RBBB and LAFB Anteroseptal infarct, old Junctional bradycardia No significant change was found Confirmed by Ezequiel Essex [id number]) on 04/09/2017 12:43:58 AM       Radiology Dg Chest Portable 1 View  Result Date: 04/09/2017 CLINICAL DATA:  Shortness of breath this morning EXAM: PORTABLE CHEST 1  VIEW COMPARISON:  07/28/2016 FINDINGS: Shallow inspiration. Cardiac enlargement. Mild pulmonary vascular congestion. No edema or consolidation. No blunting of costophrenic angles. No pneumothorax. Vascular stent in the left infraclavicular region. IMPRESSION: Cardiac enlargement with mild pulmonary vascular congestion. No edema or consolidation. Electronically Signed   By: Lucienne Capers M.D.   On: 04/09/2017 02:17   Ct Angio Abd/pel W And/or Wo Contrast  Result Date: 04/09/2017 CLINICAL DATA:  Thigh or groin pain. Right lower abdominal pain. Nausea, vomiting and diarrhea. Post aortic angiogram and balloon angioplasty earlier today. EXAM: CTA ABDOMEN AND PELVIS wITHOUT AND WITH CONTRAST TECHNIQUE: Multidetector CT imaging of the abdomen and pelvis was performed using the standard protocol during bolus administration of intravenous contrast. Multiplanar reconstructed images and MIPs were obtained and reviewed to evaluate the vascular anatomy. CONTRAST:  100 cc Isovue 370 IV COMPARISON:  None. FINDINGS: VASCULAR Aorta: Normal caliber aorta without aneurysm, dissection, or significant stenosis. Moderate atherosclerosis. There is diffuse retroperitoneal edema. Celiac: Patent without evidence of aneurysm, dissection, vasculitis or significant stenosis. SMA: Patent without evidence of aneurysm, dissection, vasculitis or significant stenosis. Renals: Single right and 2 left renal arteries are patent without evidence of aneurysm, dissection, vasculitis, fibromuscular dysplasia or significant stenosis. IMA: Patent with atherosclerosis at the origin.  No dissection. Inflow: Common  and external iliac arteries are patent with atherosclerosis. There is stenosis of the right internal iliac artery with greater than 50% without complete occlusion. There is stenosis of the left internal iliac artery of greater than 50%. Proximal Outflow: Right femoral access for aortogram. Moderate femoral groin hematoma/hemorrhage with soft  tissue stranding and extraluminal arterial extravasation, at least 3 focal arterial outpouchings measuring up to 12 mm extend to the skin surface. This extends extraluminally from the common femoral artery without the classic appearance of a pseudoaneurysm. Subtle vessel extravasation identified image 165 series 4. Right common femoral artery distal is patent. Proximal left femoral artery is patent with atherosclerosis. Bilateral profunda femorals are patent. Veins: No obvious venous abnormality within the limitations of this arterial phase study, significantly limited venous evaluation. Review of the MIP images confirms the above findings. NON-VASCULAR Lower chest: Multi chamber cardiomegaly. No pleural fluid. Mild dependent atelectasis. Hepatobiliary: No focal hepatic lesion allowing for arterial phase imaging. The gallbladder is physiologically distended. No calcified gallstone. Pancreas: Mild retroperitoneal edema, not disproportionate to the pancreas. No ductal dilatation. Spleen: Normal arterial phase enhancement.  Normal in size. Adrenals/Urinary Tract: No adrenal nodule. Thinning of bilateral renal parenchyma with multiple low-density lesions, incompletely characterized on arterial phase study. There is bilateral perinephric edema. No hydronephrosis. Urinary bladder is completely decompressed but thick walled. There is perivesicular edema. Stomach/Bowel: Stomach is nondistended. No definite bowel inflammation. No bowel obstruction. Normal appendix. Moderate colonic stool burden. Lymphatic: There prominent right inguinal nodes measuring up to 14 mm that may be reactive. Multiple small retroperitoneal nodes, for example the level of left renal artery measuring 10 mm. Reproductive: Prostate is unremarkable. Other: There is diffuse retroperitoneal edema. Diffuse mesenteric edema to lesser extent and small amount of free fluid in the pericolic gutters. Fluid in the pericolic gutters is complex. There is moderate  whole body wall edema. Small umbilical hernia containing fat and small amount of free fluid. Musculoskeletal: Degenerative change in the spine is most prominent at L5-S1. IMPRESSION: VASCULAR 1. Post right common femoral artery access for peripheral vascular intervention. Associated right groin hematoma soft tissue stranding and foci of active extravasation extending towards the skin surface. 2. Diffuse atherosclerosis of the abdominal aorta and branches. Stenosis of bilateral internal iliac arteries. NON-VASCULAR 1. There is diffuse retroperitoneal edema of uncertain significance. Diffuse mesenteric and body wall edema. Small amount of complex free fluid in both pericolic gutters, of unknown underlying etiology but possibly related to recent vascular procedure. 2. Decompressed urinary bladder with wall thickening and perivesicular inflammation. There is bilateral perinephric edema of uncertain chronicity. Patient with end-stage renal disease on dialysis. 3. Multiple low-density lesions throughout both kidneys incompletely characterized. 4. Mild right inguinal and retroperitoneal adenopathy, consider follow-up imaging after resolution of acute event to evaluate for stability/resolution. These results were called by telephone at the time of interpretation on 04/09/2017 at 2:25 am to Dr. Ezequiel Essex , who verbally acknowledged these results. Electronically Signed   By: Jeb Levering M.D.   On: 04/09/2017 02:26    Procedures Procedures (including critical care time)  Medications Ordered in ED Medications  calcium gluconate 1 g in sodium chloride 0.9 % 100 mL IVPB (not administered)     Initial Impression / Assessment and Plan / ED Course  I have reviewed the triage vital signs and the nursing notes.  Pertinent labs & imaging results that were available during my care of the patient were reviewed by me and considered in my medical decision making (see chart  for details).     Dialysis patient with  right lower quadrant pain and inguinal pain after having aortogram today.  Junctional bradycardia noted. Concern for hyperkalemia. IV calcium given   potassium is 6.6. Patient given IV calcium, bicarbonate, kayexalate.  D/w Dr. Servando Salina of nephrology. He agrees with treating for hyperkalemia and transfer to [facility]. We'll need to investigate groin issue further before having dialysis tonight.  Hemoglobin is stable. Blood pressure is stable. CT angiogram obtained to evaluate right lower quadrant and inguinal pain after aortic puncture.  Results discussed with Dr. Bridgett Larsson of vascular surgery. He feels this amount of bleeding and extravasation is typical postprocedure. No need for intervention. RP edema is likely due to patient's ESRD status and not blood.  D/w Dr. Marisue Humble.  BP and mental status remain stable in the ED.  HR improved to 60s.  Potassium 5.7 on repeat. Additional calcium given.  Admission d/w Dr. Shanon Brow.  Patient will be transferred to [facility] for urgent dialysis as well as evaluate by vascular surgery.   CRITICAL CARE Performed by: Ezequiel Essex Total critical care time: 60 minutes Critical care time was exclusive of separately billable procedures and treating other patients. Critical care was necessary to treat or prevent imminent or life-threatening deterioration. Critical care was time spent personally by me on the following activities: development of treatment plan with patient and/or surrogate as well as nursing, discussions with consultants, evaluation of patient's response to treatment, examination of patient, obtaining history from patient or surrogate, ordering and performing treatments and interventions, ordering and review of laboratory studies, ordering and review of radiographic studies, pulse oximetry and re-evaluation of patient's condition.   Final Clinical Impressions(s) / ED Diagnoses   Final diagnoses:  Hyperkalemia  Hematoma of groin, initial encounter    New  Prescriptions New Prescriptions   No medications on file     Ezequiel Essex, MD 04/09/17 [phone]

## 2017-04-09 NOTE — ED Notes (Signed)
Second IV site attempted, infiltrated.

## 2017-04-10 DIAGNOSIS — I428 Other cardiomyopathies: Secondary | ICD-10-CM

## 2017-04-10 DIAGNOSIS — E119 Type 2 diabetes mellitus without complications: Secondary | ICD-10-CM

## 2017-04-10 DIAGNOSIS — E039 Hypothyroidism, unspecified: Secondary | ICD-10-CM

## 2017-04-10 DIAGNOSIS — D638 Anemia in other chronic diseases classified elsewhere: Secondary | ICD-10-CM

## 2017-04-10 DIAGNOSIS — Z794 Long term (current) use of insulin: Secondary | ICD-10-CM

## 2017-04-10 DIAGNOSIS — D62 Acute posthemorrhagic anemia: Secondary | ICD-10-CM

## 2017-04-10 DIAGNOSIS — I10 Essential (primary) hypertension: Secondary | ICD-10-CM

## 2017-04-10 LAB — BASIC METABOLIC PANEL
ANION GAP: 12 (ref 5–15)
BUN: 84 mg/dL — ABNORMAL HIGH (ref 6–20)
CALCIUM: 8 mg/dL — AB (ref 8.9–10.3)
CHLORIDE: 96 mmol/L — AB (ref 101–111)
CO2: 24 mmol/L (ref 22–32)
Creatinine, Ser: 10.88 mg/dL — ABNORMAL HIGH (ref 0.61–1.24)
GFR calc non Af Amer: 4 mL/min — ABNORMAL LOW (ref >60)
GFR, EST AFRICAN AMERICAN: 5 mL/min — AB (ref >60)
GLUCOSE: 148 mg/dL — AB (ref 65–99)
POTASSIUM: 4.6 mmol/L (ref 3.5–5.1)
Sodium: 132 mmol/L — ABNORMAL LOW (ref 135–145)

## 2017-04-10 LAB — CBC
HCT: 24.1 % — ABNORMAL LOW (ref 39.0–52.0)
Hemoglobin: 7.2 g/dL — ABNORMAL LOW (ref 13.0–17.0)
MCH: 25.3 pg — AB (ref 26.0–34.0)
MCHC: 29.9 g/dL — ABNORMAL LOW (ref 30.0–36.0)
MCV: 84.6 fL (ref 78.0–100.0)
PLATELETS: 245 10*3/uL (ref 150–400)
RBC: 2.85 MIL/uL — AB (ref 4.22–5.81)
RDW: 18.3 % — AB (ref 11.5–15.5)
WBC: 7 10*3/uL (ref 4.0–10.5)

## 2017-04-10 LAB — GLUCOSE, CAPILLARY
Glucose-Capillary: 204 mg/dL — ABNORMAL HIGH (ref 65–99)
Glucose-Capillary: 59 mg/dL — ABNORMAL LOW (ref 65–99)
Glucose-Capillary: 95 mg/dL (ref 65–99)
Glucose-Capillary: 111 mg/dL — ABNORMAL HIGH (ref 65–99)

## 2017-04-10 LAB — PREPARE RBC (CROSSMATCH)

## 2017-04-10 LAB — HEPATITIS B SURFACE ANTIBODY,QUALITATIVE: Hep B S Ab: REACTIVE

## 2017-04-10 LAB — HEPATITIS B CORE ANTIBODY, TOTAL: Hep B Core Total Ab: NEGATIVE

## 2017-04-10 MED ORDER — SODIUM CHLORIDE 0.9 % IV SOLN: Freq: Once | INTRAVENOUS | Status: DC

## 2017-04-10 MED ORDER — CARVEDILOL 12.5 MG PO TABS: 12.5000 mg | ORAL_TABLET | ORAL | Status: AC

## 2017-04-10 NOTE — Clinical Social Work Note (Signed)
Clinical Social Work Assessment  Patient Details  Name: Jacob [name] MRN: [id number] Date of Birth: 1951/02/05  Date of referral:  04/10/17               Reason for consult:  Facility Placement                Permission sought to share information with:  Chartered certified accountant granted to share information::  Yes, Verbal Permission Granted  Name::        Agency::  [facility]  Relationship::     Contact Information:     Housing/Transportation Living arrangements for the past 2 months:  [place], [place] of Information:  Patient Patient Interpreter Needed:  None Criminal Activity/Legal Involvement Pertinent to Current Situation/Hospitalization:  No - Comment as needed Significant Relationships:    Lives with:  Self Do you feel safe going back to the place where you live?  Yes Need for family participation in patient care:  No (Coment)  Care giving concerns:  Patient has been at rehab over at [facility] and has no concerns about care received.   Social Worker assessment / plan:  CSW met with patient to discuss plan to return to [facility] after medical workup complete. Patient confirmed. CSW faxed out updated information to facility and will follow to facilitate discharge back to [facility].  Employment status:  Retired Forensic scientist:  Medicare PT Recommendations:  [place] / Referral to community resources:     Patient/Family's Response to care:  Patient agreeable to return to facility.  Patient/Family's Understanding of and Emotional Response to Diagnosis, Current Treatment, and Prognosis:  Patient is eager to leave the hospital and return to SNF.  Emotional Assessment Appearance:  Appears stated age Attitude/Demeanor/Rapport:    Affect (typically observed):  Appropriate Orientation:  Oriented to Self, Oriented to Place, Oriented to Situation, Oriented to  Time Alcohol /  Substance use:  Not Applicable Psych involvement (Current and /or in the community):  No (Comment)  Discharge Needs  Concerns to be addressed:  Care Coordination Readmission within the last 30 days:  Yes Current discharge risk:  Physical Impairment Barriers to Discharge:  Continued Medical Work up   [employer], [place] 04/10/2017, 12:40 PM

## 2017-04-10 NOTE — NC FL2 (Signed)
[place] LEVEL OF CARE SCREENING TOOL     IDENTIFICATION  Patient Name: Jacob [name] Birthdate: [birth year] Sex: male Admission Date (Current Location): 04/09/2017  [facility] and [place] Number:  [employer] and Address:  [employer], [place] [address] [place],  [address]      Provider Number: [id number]  Attending Physician Name and Address:  Barton Dubois, MD  Relative Name and Phone Number:       Current Level of Care: Hospital Recommended Level of Care: [place] Prior Approval Number:    Date Approved/Denied:   PASRR Number: [id number]  Discharge Plan: SNF    Current Diagnoses: Patient Active Problem List   Diagnosis Date Noted  . Hypothyroidism   . Bradycardia 04/09/2017  . Hematoma of groin   . Swelling of arm 03/18/2017  . PAD (peripheral artery disease) ([place])   . Acute blood loss anemia   . Anemia of chronic disease   . DNR (do not resuscitate) 02/24/2017  . Palliative care by specialist 02/24/2017  . Normocytic anemia 02/18/2017  . Hypokalemia 12/28/2016  . Insulin-requiring or dependent type II diabetes mellitus ([place]) 12/28/2016  . Gangrene of foot ([place]) 12/28/2016  . Gangrene of right foot ([employer]) 12/28/2016  . Hyperkalemia 04/05/2016  . Inadequate dialysis 04/05/2016  . Other complications due to renal dialysis device, implant, and graft 04/28/2013  . Subclavian vein occlusion (HCC) 04/28/2013  . Preoperative cardiovascular examination 12/28/2012  . Mixed hyperlipidemia 06/16/2011  . Nonischemic cardiomyopathy ([place]) 11/20/2010  . End-stage renal disease on hemodialysis ([place]) 11/20/2010  . Essential hypertension, benign 11/20/2010    Orientation RESPIRATION BLADDER Height & Weight     Self, Time, Situation, Place  Normal Continent Weight: 257 lb 15 oz (117 kg) Height:  6\' 2"  (188 cm)  BEHAVIORAL SYMPTOMS/MOOD NEUROLOGICAL BOWEL NUTRITION STATUS      Continent Diet (renal, low  sodium)  AMBULATORY STATUS COMMUNICATION OF NEEDS Skin   Limited Assist Verbally Normal                       Personal Care Assistance Level of Assistance  Bathing, Dressing Bathing Assistance: Limited assistance   Dressing Assistance: Limited assistance     Functional Limitations Info             SPECIAL CARE FACTORS FREQUENCY                       Contractures      Additional Factors Info  Code Status, Allergies, Insulin Sliding Scale Code Status Info: Full Allergies Info: Bee Venom, Iodine, Penicillins, Sulfadiazine, Plavix Clopidogrel   Insulin Sliding Scale Info: 3x/day       Current Medications (04/10/2017):  This is the current hospital active medication list Current Facility-Administered Medications  Medication Dose Route Frequency Provider Last Rate Last Dose  . 0.9 %  sodium chloride infusion  250 mL Intravenous PRN Derrill Kay A, MD      . 0.9 %  sodium chloride infusion   Intravenous Once Barton Dubois, MD      . atorvastatin (LIPITOR) tablet 40 mg  40 mg Oral q1800 Phillips Grout, MD   40 mg at 04/09/17 2222  . HYDROcodone-acetaminophen (NORCO/VICODIN) 5-325 MG per tablet 1 tablet  1 tablet Oral Q4H PRN Derrill Kay A, MD      . insulin aspart (novoLOG) injection 0-9 Units  0-9 Units Subcutaneous TID WC Assunta Curtis  K, RPH   3 Units at 04/10/17 0841  . insulin glargine (LANTUS) injection 15 Units  15 Units Subcutaneous QHS Phillips Grout, MD   15 Units at 04/09/17 2223  . sodium chloride flush (NS) 0.9 % injection 3 mL  3 mL Intravenous Q12H Derrill Kay A, MD   3 mL at 04/09/17 2226  . sodium chloride flush (NS) 0.9 % injection 3 mL  3 mL Intravenous PRN Phillips Grout, MD         Discharge Medications: Please see discharge summary for a list of discharge medications.  Relevant Imaging Results:  Relevant Lab Results:   Additional Information SS#: [id number]; HD MWF at [facility] in [facility], [place]

## 2017-04-10 NOTE — Progress Notes (Signed)
Discharge to: [facility] Anticipated discharge date: 04/10/17 Transportation by: PTAR (RN to call to set up after medically ready)  Report #: [phone] (Report to [facility] if before 5; to [facility] if after 5)  CSW signing off.  Laveda Abbe LCSW [phone]

## 2017-04-10 NOTE — Discharge Summary (Signed)
Physician Discharge Summary  Jacob [name] JAS:[id number] DOB: August 01, 1951 DOA: 04/09/2017  PCP: Sharilyn Sites, MD  Admit date: 04/09/2017 Discharge date: 04/10/2017  Time spent: 35 minutes  Recommendations for Outpatient Follow-up:  1. Repeat CBC to follow Hgb trend  2. Repeat BMET to follow electrolytes trend    Discharge Diagnoses:  Principal Problem:   Hematoma of groin Active Problems:   Nonischemic cardiomyopathy (HCC)   End-stage renal disease on hemodialysis ([place])   Essential hypertension, benign   Hyperkalemia   Insulin-requiring or dependent type II diabetes mellitus ([place])   Anemia of chronic disease   Bradycardia   Hypothyroidism   Discharge Condition: stable and improved. Discharge home with instructions to follow up with PCP in 2 weeks and to be compliant with HD treatments. Dr. Bridgett Larsson vascular surgery, will follow him up in 4 weeks.  Diet recommendation: renal diet/low sodium and modified carbohydrates   Filed Weights   04/09/17 1431 04/09/17 1851 04/09/17 2040  Weight: 119.5 kg (263 lb 7.2 oz) 117 kg (257 lb 15 oz) 117 kg (257 lb 15 oz)    History of present illness:  As per H&P written by Dr. Shanon Brow on 04/09/17 66 y.o. male with medical history significant of ESRD dialysis MWF, right BKA, HTN, stroke had a angioplasty by [facility] and comes in via EMS with right groin pain and swelling.  Pt has hematoma and bleeding to area per CT scan.  Dr Bridgett Larsson called at [facility] stated this was normal.  Pt labs revealed a k level of 6.6 and bradycardic in the 40s.  Pt denies any sob.  He is due for dialysis today.  Dr Bridgett Larsson recommended holding pressure to area and transfer to [facility].    Hospital Course:  1-right groin pain and hematoma -patient seen by vascular surgery who recommended direct pressure and conservative management -groin pain has subsided, and no sign of enlargement hematoma appreciated -will follow up with Dr. Bridgett Larsson (vascular surgeon) in 4 weeks  2-ESRD: with  HD M-W-F -continue outpatient HD treatment -no signs of acute uremia; tolerated HD here and will resume treatment on Monday 9/10 as an outpatient.  3-hyperkalemia -corrected with HD -at discharge K is 4.6  4-anemia of chronic disease: with acute component of ABLA -patient received 1 unit of PRBC during this admission  -IV iron and epogen therapy as per renal service discretion  -will recommend recheck CBC at follow up visit  5-hypothyroidism -will continue synthroid   6-HTN -will continue home antihypertensive regimen  -BP is stable and well controlled -advise to follow heart healthy diet   7-secondary hyperparathyroidism  -continue sensipar and renvela  8-type 2 diabetes with renal failure and vascular disease -continue lantus -advise to follow modified carb diet   9-non-ischemic cardiomyopathy  -no CP or SOB -follow heart healthy diet -continue b-blocker -volume control with HD  10-BPH -will continue proscar.  Procedures:  See below for x-ray reports   Consultations:  Renal service  Vascular surgery   Discharge Exam: Vitals:   04/09/17 2040 04/10/17 0514  BP: (!) 128/48 (!) 101/53  Pulse: 66 62  Resp: 16 19  Temp: 98.5 F (36.9 C) 98.6 F (37 C)  SpO2: 100% 97%    General: afebrile, no CP, no SOB. Denies pain in his groin and just feeling slightly weak. Cardiovascular: S1 and S2, no rubs, no gallops, no JVD  Respiratory: good air movement bilaterally, no wheezing, no crackles  Abd: soft, NT, ND, positive BS Extremities: RLE  BKA; LLE with foot ulcer (appears to be a PVD ulcer; no superimposed infection) Neurologic exam: CN intact, able to follow commands. No focal motor deficit appreciated.  Discharge Instructions   Discharge Instructions    Diet - low sodium heart healthy    Complete by:  As directed    Discharge instructions    Complete by:  As directed    Take medications as prescribed  Maintain adequate hydration  Follow low sodium  renal diet and modified carbohydrates diet  Arrange follow up with PCP in 2 weeks Be compliant with your HD sessions (Monday-Wednesday-Friday)     Current Discharge Medication List    CONTINUE these medications which have CHANGED   Details  carvedilol (COREG) 12.5 MG tablet Take 1 tablet (12.5 mg total) by mouth See admin instructions. Take 1 tablet (12.5 mg) by mouth twice daily on Sunday, Tuesday, Thursday, Saturday (hold on dialysis days)      CONTINUE these medications which have NOT CHANGED   Details  acetaminophen (TYLENOL) 500 MG tablet Take 1,000 mg by mouth every 6 (six) hours as needed for mild pain. Do not exceed 4 gms of tylenol in 24 hours    atorvastatin (LIPITOR) 40 MG tablet Take 1 tablet (40 mg total) by mouth daily at 6 PM. Qty: 30 tablet, Refills: 0    bisacodyl (DULCOLAX) 5 MG EC tablet Take 2 tablets (10 mg total) by mouth daily as needed for moderate constipation. Qty: 30 tablet, Refills: 0    Cholecalciferol (VITAMIN D) 2000 units tablet Take 2,000 Units by mouth daily.    cinacalcet (SENSIPAR) 30 MG tablet Take 30 mg by mouth daily.    clopidogrel (PLAVIX) 75 MG tablet Take 75 mg by mouth daily.    diphenhydrAMINE (BENADRYL) 25 mg capsule Take 25 mg by mouth every 8 (eight) hours as needed for allergies.    EPINEPHrine (EPIPEN 2-PAK) 0.3 mg/0.3 mL IJ SOAJ injection Inject 0.3 mg into the muscle once as needed (anaphylaxis).    finasteride (PROSCAR) 5 MG tablet Take 1 tablet (5 mg total) by mouth daily. Qty: 30 tablet, Refills: 0    HYDROcodone-acetaminophen (NORCO/VICODIN) 5-325 MG tablet Take 1 tablet by mouth every 4 (four) hours as needed for moderate pain. Qty: 10 tablet, Refills: 0    levothyroxine (SYNTHROID, LEVOTHROID) 75 MCG tablet Take 75 mcg by mouth daily before breakfast.    Multiple Vitamin (MULTIVITAMIN WITH MINERALS) TABS tablet Take 1 tablet by mouth every Monday, Wednesday, and Friday. Certagen    oxymetazoline (AFRIN) 0.05 % nasal  spray Place 2 sprays into both nostrils every 4 (four) hours as needed (nose bleeds).    polyethylene glycol powder (GLYCOLAX/MIRALAX) powder Take 17 g by mouth daily. Qty: 255 g, Refills: 0    potassium chloride SA (K-DUR,KLOR-CON) 20 MEQ tablet Take 20 mEq by mouth daily after lunch.    senna (SENOKOT) 8.6 MG TABS tablet Take 2 tablets (17.2 mg total) by mouth at bedtime. Qty: 120 each, Refills: 0    sevelamer carbonate (RENVELA) 800 MG tablet Take 800-2,400 mg by mouth See admin instructions. Take 3 tablets (2400 mg) by mouth three times daily with meals and 1 tablet (800 mg) by mouth two times daily with snack (10 am, 10 pm)    LANTUS SOLOSTAR 100 UNIT/ML Solostar Pen Inject 15 Units into the skin at bedtime. Qty: 15 mL, Refills: 0      STOP taking these medications     aspirin 81 MG tablet  vancomycin (VANCOCIN) 1-5 GM/200ML-% SOLN        Allergies  Allergen Reactions  . Bee Venom Anaphylaxis  . Iodine Swelling  . Penicillins Swelling and Other (See Comments)    SWELLING REACTION UNSPECIFIED   Has patient had a PCN reaction causing immediate rash, facial/tongue/throat swelling, SOB or lightheadedness with hypotension: No Has patient had a PCN reaction causing severe rash involving mucus membranes or skin necrosis: No Has patient had a PCN reaction that required hospitalization No Has patient had a PCN reaction occurring within the last 10 years: No If all of the above answers are "NO", then may proceed with Cephalosporin use.  . Sulfadiazine Other (See Comments)    1% Silver Sulfadiazine cream causes burning over a large area of skin.  [name] Plavix [Clopidogrel] Other (See Comments)    Caused Nose bleed   Follow-up Information    Sharilyn Sites, MD. Schedule an appointment as soon as possible for a visit in 2 week(s).   Specialty:  Family Medicine Contact information: [address] [place] [address] [phone]           The results of significant  diagnostics from this hospitalization (including imaging, microbiology, ancillary and laboratory) are listed below for reference.    Significant Diagnostic Studies: Dg Chest Portable 1 View  Result Date: 04/09/2017 CLINICAL DATA:  Shortness of breath this morning EXAM: PORTABLE CHEST 1 VIEW COMPARISON:  07/28/2016 FINDINGS: Shallow inspiration. Cardiac enlargement. Mild pulmonary vascular congestion. No edema or consolidation. No blunting of costophrenic angles. No pneumothorax. Vascular stent in the left infraclavicular region. IMPRESSION: Cardiac enlargement with mild pulmonary vascular congestion. No edema or consolidation. Electronically Signed   By: Lucienne Capers M.D.   On: 04/09/2017 02:17   Ct Angio Abd/pel W And/or Wo Contrast  Result Date: 04/09/2017 CLINICAL DATA:  Thigh or groin pain. Right lower abdominal pain. Nausea, vomiting and diarrhea. Post aortic angiogram and balloon angioplasty earlier today. EXAM: CTA ABDOMEN AND PELVIS wITHOUT AND WITH CONTRAST TECHNIQUE: Multidetector CT imaging of the abdomen and pelvis was performed using the standard protocol during bolus administration of intravenous contrast. Multiplanar reconstructed images and MIPs were obtained and reviewed to evaluate the vascular anatomy. CONTRAST:  100 cc Isovue 370 IV COMPARISON:  None. FINDINGS: VASCULAR Aorta: Normal caliber aorta without aneurysm, dissection, or significant stenosis. Moderate atherosclerosis. There is diffuse retroperitoneal edema. Celiac: Patent without evidence of aneurysm, dissection, vasculitis or significant stenosis. SMA: Patent without evidence of aneurysm, dissection, vasculitis or significant stenosis. Renals: Single right and 2 left renal arteries are patent without evidence of aneurysm, dissection, vasculitis, fibromuscular dysplasia or significant stenosis. IMA: Patent with atherosclerosis at the origin.  No dissection. Inflow: Common and external iliac arteries are patent with  atherosclerosis. There is stenosis of the right internal iliac artery with greater than 50% without complete occlusion. There is stenosis of the left internal iliac artery of greater than 50%. Proximal Outflow: Right femoral access for aortogram. Moderate femoral groin hematoma/hemorrhage with soft tissue stranding and extraluminal arterial extravasation, at least 3 focal arterial outpouchings measuring up to 12 mm extend to the skin surface. This extends extraluminally from the common femoral artery without the classic appearance of a pseudoaneurysm. Subtle vessel extravasation identified image 165 series 4. Right common femoral artery distal is patent. Proximal left femoral artery is patent with atherosclerosis. Bilateral profunda femorals are patent. Veins: No obvious venous abnormality within the limitations of this arterial phase study, significantly limited venous evaluation. Review of the MIP images confirms  the above findings. NON-VASCULAR Lower chest: Multi chamber cardiomegaly. No pleural fluid. Mild dependent atelectasis. Hepatobiliary: No focal hepatic lesion allowing for arterial phase imaging. The gallbladder is physiologically distended. No calcified gallstone. Pancreas: Mild retroperitoneal edema, not disproportionate to the pancreas. No ductal dilatation. Spleen: Normal arterial phase enhancement.  Normal in size. Adrenals/Urinary Tract: No adrenal nodule. Thinning of bilateral renal parenchyma with multiple low-density lesions, incompletely characterized on arterial phase study. There is bilateral perinephric edema. No hydronephrosis. Urinary bladder is completely decompressed but thick walled. There is perivesicular edema. Stomach/Bowel: Stomach is nondistended. No definite bowel inflammation. No bowel obstruction. Normal appendix. Moderate colonic stool burden. Lymphatic: There prominent right inguinal nodes measuring up to 14 mm that may be reactive. Multiple small retroperitoneal nodes, for  example the level of left renal artery measuring 10 mm. Reproductive: Prostate is unremarkable. Other: There is diffuse retroperitoneal edema. Diffuse mesenteric edema to lesser extent and small amount of free fluid in the pericolic gutters. Fluid in the pericolic gutters is complex. There is moderate whole body wall edema. Small umbilical hernia containing fat and small amount of free fluid. Musculoskeletal: Degenerative change in the spine is most prominent at L5-S1. IMPRESSION: VASCULAR 1. Post right common femoral artery access for peripheral vascular intervention. Associated right groin hematoma soft tissue stranding and foci of active extravasation extending towards the skin surface. 2. Diffuse atherosclerosis of the abdominal aorta and branches. Stenosis of bilateral internal iliac arteries. NON-VASCULAR 1. There is diffuse retroperitoneal edema of uncertain significance. Diffuse mesenteric and body wall edema. Small amount of complex free fluid in both pericolic gutters, of unknown underlying etiology but possibly related to recent vascular procedure. 2. Decompressed urinary bladder with wall thickening and perivesicular inflammation. There is bilateral perinephric edema of uncertain chronicity. Patient with end-stage renal disease on dialysis. 3. Multiple low-density lesions throughout both kidneys incompletely characterized. 4. Mild right inguinal and retroperitoneal adenopathy, consider follow-up imaging after resolution of acute event to evaluate for stability/resolution. These results were called by telephone at the time of interpretation on 04/09/2017 at 2:25 am to Dr. Ezequiel Essex , who verbally acknowledged these results. Electronically Signed   By: Jeb Levering M.D.   On: 04/09/2017 02:26    Microbiology: Recent Results (from the past 240 hour(s))  MRSA PCR Screening     Status: None   Collection Time: 04/09/17 10:55 AM  Result Value Ref Range Status   MRSA by PCR NEGATIVE NEGATIVE  Final    Comment:        The GeneXpert MRSA Assay (FDA approved for NASAL specimens only), is one component of a comprehensive MRSA colonization surveillance program. It is not intended to diagnose MRSA infection nor to guide or monitor treatment for MRSA infections.      Labs: Basic Metabolic Panel:  Recent Labs Lab 04/08/17 0700 04/09/17 0101 04/09/17 0113 04/09/17 0319 04/09/17 0737 04/10/17 0415  NA 136 128* 132*  --  131* 132*  K 5.4* 6.5* 6.6* 5.7* 5.8* 4.6  CL 98* 95* 97*  --  96* 96*  CO2  --  22  --   --  22 24  GLUCOSE 153* 346* 349*  --  234* 148*  BUN 71* 88* 80*  --  88* 84*  CREATININE 11.00* 10.70* 11.50*  --  11.23* 10.88*  CALCIUM  --  8.4*  --   --  8.8* 8.0*   Liver Function Tests:  Recent Labs Lab 04/09/17 0101  AST 17  ALT 13*  ALKPHOS 156*  BILITOT 0.6  PROT 8.7*  ALBUMIN 2.7*   CBC:  Recent Labs Lab 04/08/17 0700 04/09/17 0101 04/09/17 0113 04/09/17 0737 04/10/17 0415  WBC  --  6.2  --  8.1 7.0  NEUTROABS  --  5.6  --   --   --   HGB 9.5* 8.5* 10.2* 8.7* 7.2*  HCT 28.0* 28.0* 30.0* 29.3* 24.1*  MCV  --  85.9  --  84.2 84.6  PLT  --  255  --  271 245    CBG:  Recent Labs Lab 04/08/17 1253 04/09/17 0756 04/09/17 1215 04/09/17 2036 04/10/17 0752  GLUCAP 194* 229* 252* 183* 204*    Signed:  Barton Dubois MD.  [facility] 04/10/2017, 8:54 AM

## 2017-04-10 NOTE — Progress Notes (Signed)
Blood transfusion completed at 1540.No reaction as of this time.Report given to [facility] [place] at [phone].Will call PTAR.

## 2017-04-10 NOTE — Care Management Note (Signed)
Case Management Note  Patient Details  Name: Jacob [name] MRN: [id number] Date of Birth: 20-May-1951  Subjective/Objective:                 Spoke with patient he states he is from [facility] and will return to it. CM placed CSW consult and notified Rhea Pink CSW [phone] for assistance with DC to SNF.    Action/Plan:  DC to SNF as Facilitated by CSW Expected Discharge Date:  04/10/17               Expected Discharge Plan:  Skilled Nursing Facility  In-House Referral:  Clinical Social Work  Discharge planning Services  CM Consult  Post Acute Care Choice:    Choice offered to:     DME Arranged:    DME Agency:     HH Arranged:    [place] Agency:     Status of Service:  Completed, signed off  If discussed at [employer], dates discussed:    Additional Comments:  Carles Collet, RN 04/10/2017, 9:10 AM

## 2017-04-10 NOTE — Progress Notes (Signed)
Assessment:  1 ESRD 2 Hyperkalemia, resolved 3 ABLA, post procedure Plan: 1 PRBCs per Dr Dyann Kief 2 F/u with [facility] dialysis  Subjective: Interval History: Tol HD last PM with 2500cc of fluid off  Objective: Vital signs in last 24 hours: Temp:  [98 F (36.7 C)-98.6 F (37 C)] 98.6 F (37 C) (09/08 0514) Pulse Rate:  [54-66] 62 (09/08 0514) Resp:  [11-19] 19 (09/08 0514) BP: (101-156)/(45-71) 101/53 (09/08 0514) SpO2:  [97 %-100 %] 97 % (09/08 0514) Weight:  [117 kg (257 lb 15 oz)-119.5 kg (263 lb 7.2 oz)] 117 kg (257 lb 15 oz) (09/07 2040) Weight change: -4.785 kg (-10 lb 8.8 oz)  Intake/Output from previous day: 09/07 0701 - 09/08 0700 In: 243 [P.O.:240; I.V.:3] Out: 2500  Intake/Output this shift: No intake/output data recorded.  General appearance: alert and cooperative  Tr Le edema bilat  Lab Results:  Recent Labs  04/09/17 0737 04/10/17 0415  WBC 8.1 7.0  HGB 8.7* 7.2*  HCT 29.3* 24.1*  PLT 271 245   BMET:  Recent Labs  04/09/17 0737 04/10/17 0415  NA 131* 132*  K 5.8* 4.6  CL 96* 96*  CO2 22 24  GLUCOSE 234* 148*  BUN 88* 84*  CREATININE 11.23* 10.88*  CALCIUM 8.8* 8.0*   No results for input(s): PTH in the last 72 hours. Iron Studies: No results for input(s): IRON, TIBC, TRANSFERRIN, FERRITIN in the last 72 hours. Studies/Results: Dg Chest Portable 1 View  Result Date: 04/09/2017 CLINICAL DATA:  Shortness of breath this morning EXAM: PORTABLE CHEST 1 VIEW COMPARISON:  07/28/2016 FINDINGS: Shallow inspiration. Cardiac enlargement. Mild pulmonary vascular congestion. No edema or consolidation. No blunting of costophrenic angles. No pneumothorax. Vascular stent in the left infraclavicular region. IMPRESSION: Cardiac enlargement with mild pulmonary vascular congestion. No edema or consolidation. Electronically Signed   By: Lucienne Capers M.D.   On: 04/09/2017 02:17   Ct Angio Abd/pel W And/or Wo Contrast  Result Date: 04/09/2017 CLINICAL DATA:   Thigh or groin pain. Right lower abdominal pain. Nausea, vomiting and diarrhea. Post aortic angiogram and balloon angioplasty earlier today. EXAM: CTA ABDOMEN AND PELVIS wITHOUT AND WITH CONTRAST TECHNIQUE: Multidetector CT imaging of the abdomen and pelvis was performed using the standard protocol during bolus administration of intravenous contrast. Multiplanar reconstructed images and MIPs were obtained and reviewed to evaluate the vascular anatomy. CONTRAST:  100 cc Isovue 370 IV COMPARISON:  None. FINDINGS: VASCULAR Aorta: Normal caliber aorta without aneurysm, dissection, or significant stenosis. Moderate atherosclerosis. There is diffuse retroperitoneal edema. Celiac: Patent without evidence of aneurysm, dissection, vasculitis or significant stenosis. SMA: Patent without evidence of aneurysm, dissection, vasculitis or significant stenosis. Renals: Single right and 2 left renal arteries are patent without evidence of aneurysm, dissection, vasculitis, fibromuscular dysplasia or significant stenosis. IMA: Patent with atherosclerosis at the origin.  No dissection. Inflow: Common and external iliac arteries are patent with atherosclerosis. There is stenosis of the right internal iliac artery with greater than 50% without complete occlusion. There is stenosis of the left internal iliac artery of greater than 50%. Proximal Outflow: Right femoral access for aortogram. Moderate femoral groin hematoma/hemorrhage with soft tissue stranding and extraluminal arterial extravasation, at least 3 focal arterial outpouchings measuring up to 12 mm extend to the skin surface. This extends extraluminally from the common femoral artery without the classic appearance of a pseudoaneurysm. Subtle vessel extravasation identified image 165 series 4. Right common femoral artery distal is patent. Proximal left femoral artery is patent with atherosclerosis.  Bilateral profunda femorals are patent. Veins: No obvious venous abnormality within  the limitations of this arterial phase study, significantly limited venous evaluation. Review of the MIP images confirms the above findings. NON-VASCULAR Lower chest: Multi chamber cardiomegaly. No pleural fluid. Mild dependent atelectasis. Hepatobiliary: No focal hepatic lesion allowing for arterial phase imaging. The gallbladder is physiologically distended. No calcified gallstone. Pancreas: Mild retroperitoneal edema, not disproportionate to the pancreas. No ductal dilatation. Spleen: Normal arterial phase enhancement.  Normal in size. Adrenals/Urinary Tract: No adrenal nodule. Thinning of bilateral renal parenchyma with multiple low-density lesions, incompletely characterized on arterial phase study. There is bilateral perinephric edema. No hydronephrosis. Urinary bladder is completely decompressed but thick walled. There is perivesicular edema. Stomach/Bowel: Stomach is nondistended. No definite bowel inflammation. No bowel obstruction. Normal appendix. Moderate colonic stool burden. Lymphatic: There prominent right inguinal nodes measuring up to 14 mm that may be reactive. Multiple small retroperitoneal nodes, for example the level of left renal artery measuring 10 mm. Reproductive: Prostate is unremarkable. Other: There is diffuse retroperitoneal edema. Diffuse mesenteric edema to lesser extent and small amount of free fluid in the pericolic gutters. Fluid in the pericolic gutters is complex. There is moderate whole body wall edema. Small umbilical hernia containing fat and small amount of free fluid. Musculoskeletal: Degenerative change in the spine is most prominent at L5-S1. IMPRESSION: VASCULAR 1. Post right common femoral artery access for peripheral vascular intervention. Associated right groin hematoma soft tissue stranding and foci of active extravasation extending towards the skin surface. 2. Diffuse atherosclerosis of the abdominal aorta and branches. Stenosis of bilateral internal iliac arteries.  NON-VASCULAR 1. There is diffuse retroperitoneal edema of uncertain significance. Diffuse mesenteric and body wall edema. Small amount of complex free fluid in both pericolic gutters, of unknown underlying etiology but possibly related to recent vascular procedure. 2. Decompressed urinary bladder with wall thickening and perivesicular inflammation. There is bilateral perinephric edema of uncertain chronicity. Patient with end-stage renal disease on dialysis. 3. Multiple low-density lesions throughout both kidneys incompletely characterized. 4. Mild right inguinal and retroperitoneal adenopathy, consider follow-up imaging after resolution of acute event to evaluate for stability/resolution. These results were called by telephone at the time of interpretation on 04/09/2017 at 2:25 am to Dr. Ezequiel Essex , who verbally acknowledged these results. Electronically Signed   By: Jeb Levering M.D.   On: 04/09/2017 02:26    Scheduled: . atorvastatin  40 mg Oral q1800  . insulin aspart  0-9 Units Subcutaneous TID WC  . insulin glargine  15 Units Subcutaneous QHS  . sodium chloride flush  3 mL Intravenous Q12H     LOS: 1 day   Frankee Gritz C 04/10/2017,10:24 AM

## 2017-04-11 DIAGNOSIS — E785 Hyperlipidemia, unspecified: Secondary | ICD-10-CM | POA: Diagnosis not present

## 2017-04-11 DIAGNOSIS — T8189XD Other complications of procedures, not elsewhere classified, subsequent encounter: Secondary | ICD-10-CM | POA: Diagnosis not present

## 2017-04-11 DIAGNOSIS — D500 Iron deficiency anemia secondary to blood loss (chronic): Secondary | ICD-10-CM | POA: Diagnosis not present

## 2017-04-11 DIAGNOSIS — T82898A Other specified complication of vascular prosthetic devices, implants and grafts, initial encounter: Secondary | ICD-10-CM | POA: Diagnosis not present

## 2017-04-11 DIAGNOSIS — D631 Anemia in chronic kidney disease: Secondary | ICD-10-CM | POA: Diagnosis not present

## 2017-04-11 DIAGNOSIS — Z8673 Personal history of transient ischemic attack (TIA), and cerebral infarction without residual deficits: Secondary | ICD-10-CM | POA: Diagnosis not present

## 2017-04-11 DIAGNOSIS — Z23 Encounter for immunization: Secondary | ICD-10-CM | POA: Diagnosis not present

## 2017-04-11 DIAGNOSIS — H04129 Dry eye syndrome of unspecified lacrimal gland: Secondary | ICD-10-CM | POA: Diagnosis not present

## 2017-04-11 DIAGNOSIS — Z4901 Encounter for fitting and adjustment of extracorporeal dialysis catheter: Secondary | ICD-10-CM | POA: Diagnosis not present

## 2017-04-11 DIAGNOSIS — L089 Local infection of the skin and subcutaneous tissue, unspecified: Secondary | ICD-10-CM | POA: Diagnosis not present

## 2017-04-11 DIAGNOSIS — F329 Major depressive disorder, single episode, unspecified: Secondary | ICD-10-CM | POA: Diagnosis not present

## 2017-04-11 DIAGNOSIS — E039 Hypothyroidism, unspecified: Secondary | ICD-10-CM | POA: Diagnosis not present

## 2017-04-11 DIAGNOSIS — I10 Essential (primary) hypertension: Secondary | ICD-10-CM | POA: Diagnosis not present

## 2017-04-11 DIAGNOSIS — T148XXD Other injury of unspecified body region, subsequent encounter: Secondary | ICD-10-CM | POA: Diagnosis not present

## 2017-04-11 DIAGNOSIS — Z452 Encounter for adjustment and management of vascular access device: Secondary | ICD-10-CM | POA: Diagnosis not present

## 2017-04-11 DIAGNOSIS — E559 Vitamin D deficiency, unspecified: Secondary | ICD-10-CM | POA: Diagnosis not present

## 2017-04-11 DIAGNOSIS — H409 Unspecified glaucoma: Secondary | ICD-10-CM | POA: Diagnosis not present

## 2017-04-11 DIAGNOSIS — F319 Bipolar disorder, unspecified: Secondary | ICD-10-CM | POA: Diagnosis not present

## 2017-04-11 DIAGNOSIS — Z880 Allergy status to penicillin: Secondary | ICD-10-CM | POA: Diagnosis not present

## 2017-04-11 DIAGNOSIS — E871 Hypo-osmolality and hyponatremia: Secondary | ICD-10-CM | POA: Diagnosis not present

## 2017-04-11 DIAGNOSIS — Z4781 Encounter for orthopedic aftercare following surgical amputation: Secondary | ICD-10-CM | POA: Diagnosis not present

## 2017-04-11 DIAGNOSIS — N185 Chronic kidney disease, stage 5: Secondary | ICD-10-CM | POA: Diagnosis not present

## 2017-04-11 DIAGNOSIS — Z89511 Acquired absence of right leg below knee: Secondary | ICD-10-CM | POA: Diagnosis not present

## 2017-04-11 DIAGNOSIS — Z87448 Personal history of other diseases of urinary system: Secondary | ICD-10-CM | POA: Diagnosis not present

## 2017-04-11 DIAGNOSIS — K5900 Constipation, unspecified: Secondary | ICD-10-CM | POA: Diagnosis not present

## 2017-04-11 DIAGNOSIS — I779 Disorder of arteries and arterioles, unspecified: Secondary | ICD-10-CM | POA: Diagnosis not present

## 2017-04-11 DIAGNOSIS — Z992 Dependence on renal dialysis: Secondary | ICD-10-CM | POA: Diagnosis not present

## 2017-04-11 DIAGNOSIS — Z882 Allergy status to sulfonamides status: Secondary | ICD-10-CM | POA: Diagnosis not present

## 2017-04-11 DIAGNOSIS — I420 Dilated cardiomyopathy: Secondary | ICD-10-CM | POA: Diagnosis not present

## 2017-04-11 DIAGNOSIS — D509 Iron deficiency anemia, unspecified: Secondary | ICD-10-CM | POA: Diagnosis not present

## 2017-04-11 DIAGNOSIS — N2581 Secondary hyperparathyroidism of renal origin: Secondary | ICD-10-CM | POA: Diagnosis not present

## 2017-04-11 DIAGNOSIS — Z5309 Procedure and treatment not carried out because of other contraindication: Secondary | ICD-10-CM | POA: Diagnosis not present

## 2017-04-11 DIAGNOSIS — Y832 Surgical operation with anastomosis, bypass or graft as the cause of abnormal reaction of the patient, or of later complication, without mention of misadventure at the time of the procedure: Secondary | ICD-10-CM | POA: Diagnosis not present

## 2017-04-11 DIAGNOSIS — Z7982 Long term (current) use of aspirin: Secondary | ICD-10-CM | POA: Diagnosis not present

## 2017-04-11 DIAGNOSIS — Z79899 Other long term (current) drug therapy: Secondary | ICD-10-CM | POA: Diagnosis not present

## 2017-04-11 DIAGNOSIS — Z7902 Long term (current) use of antithrombotics/antiplatelets: Secondary | ICD-10-CM | POA: Diagnosis not present

## 2017-04-11 DIAGNOSIS — N186 End stage renal disease: Secondary | ICD-10-CM | POA: Diagnosis not present

## 2017-04-11 DIAGNOSIS — I739 Peripheral vascular disease, unspecified: Secondary | ICD-10-CM | POA: Diagnosis not present

## 2017-04-11 DIAGNOSIS — K219 Gastro-esophageal reflux disease without esophagitis: Secondary | ICD-10-CM | POA: Diagnosis not present

## 2017-04-11 DIAGNOSIS — G4700 Insomnia, unspecified: Secondary | ICD-10-CM | POA: Diagnosis not present

## 2017-04-11 DIAGNOSIS — M1990 Unspecified osteoarthritis, unspecified site: Secondary | ICD-10-CM | POA: Diagnosis not present

## 2017-04-11 DIAGNOSIS — I120 Hypertensive chronic kidney disease with stage 5 chronic kidney disease or end stage renal disease: Secondary | ICD-10-CM | POA: Diagnosis not present

## 2017-04-11 DIAGNOSIS — E1151 Type 2 diabetes mellitus with diabetic peripheral angiopathy without gangrene: Secondary | ICD-10-CM | POA: Diagnosis not present

## 2017-04-11 DIAGNOSIS — I428 Other cardiomyopathies: Secondary | ICD-10-CM | POA: Diagnosis not present

## 2017-04-11 DIAGNOSIS — B9562 Methicillin resistant Staphylococcus aureus infection as the cause of diseases classified elsewhere: Secondary | ICD-10-CM | POA: Diagnosis not present

## 2017-04-11 DIAGNOSIS — Z4682 Encounter for fitting and adjustment of non-vascular catheter: Secondary | ICD-10-CM | POA: Diagnosis not present

## 2017-04-11 DIAGNOSIS — T82868A Thrombosis of vascular prosthetic devices, implants and grafts, initial encounter: Secondary | ICD-10-CM | POA: Diagnosis not present

## 2017-04-11 DIAGNOSIS — D72829 Elevated white blood cell count, unspecified: Secondary | ICD-10-CM | POA: Diagnosis not present

## 2017-04-11 DIAGNOSIS — T829XXA Unspecified complication of cardiac and vascular prosthetic device, implant and graft, initial encounter: Secondary | ICD-10-CM | POA: Diagnosis not present

## 2017-04-11 DIAGNOSIS — Z8249 Family history of ischemic heart disease and other diseases of the circulatory system: Secondary | ICD-10-CM | POA: Diagnosis not present

## 2017-04-11 DIAGNOSIS — R040 Epistaxis: Secondary | ICD-10-CM | POA: Diagnosis not present

## 2017-04-11 DIAGNOSIS — D638 Anemia in other chronic diseases classified elsewhere: Secondary | ICD-10-CM | POA: Diagnosis not present

## 2017-04-11 DIAGNOSIS — Z8781 Personal history of (healed) traumatic fracture: Secondary | ICD-10-CM | POA: Diagnosis not present

## 2017-04-11 DIAGNOSIS — E119 Type 2 diabetes mellitus without complications: Secondary | ICD-10-CM | POA: Diagnosis not present

## 2017-04-11 DIAGNOSIS — Z883 Allergy status to other anti-infective agents status: Secondary | ICD-10-CM | POA: Diagnosis not present

## 2017-04-11 DIAGNOSIS — Z794 Long term (current) use of insulin: Secondary | ICD-10-CM | POA: Diagnosis not present

## 2017-04-11 DIAGNOSIS — T82858A Stenosis of vascular prosthetic devices, implants and grafts, initial encounter: Secondary | ICD-10-CM | POA: Diagnosis not present

## 2017-04-11 DIAGNOSIS — I639 Cerebral infarction, unspecified: Secondary | ICD-10-CM | POA: Diagnosis not present

## 2017-04-11 DIAGNOSIS — F419 Anxiety disorder, unspecified: Secondary | ICD-10-CM | POA: Diagnosis not present

## 2017-04-11 DIAGNOSIS — N401 Enlarged prostate with lower urinary tract symptoms: Secondary | ICD-10-CM | POA: Diagnosis not present

## 2017-04-11 DIAGNOSIS — M6281 Muscle weakness (generalized): Secondary | ICD-10-CM | POA: Diagnosis not present

## 2017-04-11 DIAGNOSIS — E1122 Type 2 diabetes mellitus with diabetic chronic kidney disease: Secondary | ICD-10-CM | POA: Diagnosis not present

## 2017-04-11 LAB — BPAM RBC
Blood Product Expiration Date: 201810042359
ISSUE DATE / TIME: 201809081213
UNIT TYPE AND RH: 5100

## 2017-04-11 LAB — TYPE AND SCREEN
ABO/RH(D): O POS
ANTIBODY SCREEN: NEGATIVE
UNIT DIVISION: 0

## 2017-04-12 ENCOUNTER — Other Ambulatory Visit: Payer: Self-pay | Admitting: Radiology

## 2017-04-12 ENCOUNTER — Other Ambulatory Visit: Payer: Self-pay | Admitting: General Surgery

## 2017-04-12 ENCOUNTER — Other Ambulatory Visit (HOSPITAL_COMMUNITY): Payer: Self-pay | Admitting: Nephrology

## 2017-04-12 DIAGNOSIS — Z23 Encounter for immunization: Secondary | ICD-10-CM | POA: Diagnosis not present

## 2017-04-12 DIAGNOSIS — N186 End stage renal disease: Secondary | ICD-10-CM

## 2017-04-12 DIAGNOSIS — Z992 Dependence on renal dialysis: Secondary | ICD-10-CM | POA: Diagnosis not present

## 2017-04-12 DIAGNOSIS — D631 Anemia in chronic kidney disease: Secondary | ICD-10-CM | POA: Diagnosis not present

## 2017-04-12 DIAGNOSIS — N2581 Secondary hyperparathyroidism of renal origin: Secondary | ICD-10-CM | POA: Diagnosis not present

## 2017-04-12 DIAGNOSIS — D509 Iron deficiency anemia, unspecified: Secondary | ICD-10-CM | POA: Diagnosis not present

## 2017-04-13 ENCOUNTER — Ambulatory Visit (HOSPITAL_COMMUNITY)
Admission: RE | Admit: 2017-04-13 | Discharge: 2017-04-13 | Disposition: A | Discharge status: Home / Self Care | Payer: Medicare Other | Source: Ambulatory Visit | Attending: Nephrology | Admitting: Nephrology

## 2017-04-13 ENCOUNTER — Ambulatory Visit: Payer: Self-pay | Admitting: Licensed Clinical Social Worker

## 2017-04-13 ENCOUNTER — Encounter (HOSPITAL_COMMUNITY): Payer: Self-pay

## 2017-04-13 ENCOUNTER — Other Ambulatory Visit (HOSPITAL_COMMUNITY): Payer: Self-pay | Admitting: Nephrology

## 2017-04-13 DIAGNOSIS — Z882 Allergy status to sulfonamides status: Secondary | ICD-10-CM | POA: Insufficient documentation

## 2017-04-13 DIAGNOSIS — N186 End stage renal disease: Secondary | ICD-10-CM

## 2017-04-13 DIAGNOSIS — Z4901 Encounter for fitting and adjustment of extracorporeal dialysis catheter: Secondary | ICD-10-CM | POA: Diagnosis not present

## 2017-04-13 DIAGNOSIS — T82868A Thrombosis of vascular prosthetic devices, implants and grafts, initial encounter: Secondary | ICD-10-CM | POA: Diagnosis not present

## 2017-04-13 DIAGNOSIS — F329 Major depressive disorder, single episode, unspecified: Secondary | ICD-10-CM | POA: Insufficient documentation

## 2017-04-13 DIAGNOSIS — Y832 Surgical operation with anastomosis, bypass or graft as the cause of abnormal reaction of the patient, or of later complication, without mention of misadventure at the time of the procedure: Secondary | ICD-10-CM | POA: Insufficient documentation

## 2017-04-13 DIAGNOSIS — H409 Unspecified glaucoma: Secondary | ICD-10-CM | POA: Insufficient documentation

## 2017-04-13 DIAGNOSIS — I120 Hypertensive chronic kidney disease with stage 5 chronic kidney disease or end stage renal disease: Secondary | ICD-10-CM | POA: Insufficient documentation

## 2017-04-13 DIAGNOSIS — Z992 Dependence on renal dialysis: Secondary | ICD-10-CM | POA: Diagnosis not present

## 2017-04-13 DIAGNOSIS — E1122 Type 2 diabetes mellitus with diabetic chronic kidney disease: Secondary | ICD-10-CM | POA: Insufficient documentation

## 2017-04-13 DIAGNOSIS — D631 Anemia in chronic kidney disease: Secondary | ICD-10-CM | POA: Diagnosis not present

## 2017-04-13 DIAGNOSIS — Z4682 Encounter for fitting and adjustment of non-vascular catheter: Secondary | ICD-10-CM | POA: Diagnosis not present

## 2017-04-13 DIAGNOSIS — I428 Other cardiomyopathies: Secondary | ICD-10-CM | POA: Insufficient documentation

## 2017-04-13 DIAGNOSIS — Z87448 Personal history of other diseases of urinary system: Secondary | ICD-10-CM | POA: Diagnosis not present

## 2017-04-13 DIAGNOSIS — F419 Anxiety disorder, unspecified: Secondary | ICD-10-CM | POA: Insufficient documentation

## 2017-04-13 DIAGNOSIS — Z89511 Acquired absence of right leg below knee: Secondary | ICD-10-CM | POA: Insufficient documentation

## 2017-04-13 DIAGNOSIS — T82898A Other specified complication of vascular prosthetic devices, implants and grafts, initial encounter: Secondary | ICD-10-CM | POA: Diagnosis not present

## 2017-04-13 DIAGNOSIS — Z8673 Personal history of transient ischemic attack (TIA), and cerebral infarction without residual deficits: Secondary | ICD-10-CM | POA: Insufficient documentation

## 2017-04-13 DIAGNOSIS — K219 Gastro-esophageal reflux disease without esophagitis: Secondary | ICD-10-CM | POA: Insufficient documentation

## 2017-04-13 DIAGNOSIS — Z880 Allergy status to penicillin: Secondary | ICD-10-CM | POA: Insufficient documentation

## 2017-04-13 DIAGNOSIS — G4700 Insomnia, unspecified: Secondary | ICD-10-CM | POA: Insufficient documentation

## 2017-04-13 HISTORY — PX: IR FLUORO GUIDE CV LINE RIGHT: IMG2283

## 2017-04-13 HISTORY — PX: IR US GUIDE VASC ACCESS RIGHT: IMG2390

## 2017-04-13 LAB — BASIC METABOLIC PANEL
Anion gap: 15 (ref 5–15)
BUN: 116 mg/dL — ABNORMAL HIGH (ref 6–20)
CALCIUM: 8.5 mg/dL — AB (ref 8.9–10.3)
CO2: 22 mmol/L (ref 22–32)
CREATININE: 14.61 mg/dL — AB (ref 0.61–1.24)
Chloride: 97 mmol/L — ABNORMAL LOW (ref 101–111)
GFR calc non Af Amer: 3 mL/min — ABNORMAL LOW (ref >60)
GFR, EST AFRICAN AMERICAN: 3 mL/min — AB (ref >60)
Glucose, Bld: 94 mg/dL (ref 65–99)
Potassium: 5.7 mmol/L — ABNORMAL HIGH (ref 3.5–5.1)
Sodium: 134 mmol/L — ABNORMAL LOW (ref 135–145)

## 2017-04-13 LAB — PROTIME-INR
INR: 1.53
PROTHROMBIN TIME: 18.3 s — AB (ref 11.4–15.2)

## 2017-04-13 LAB — CBC
HCT: 26.5 % — ABNORMAL LOW (ref 39.0–52.0)
Hemoglobin: 8 g/dL — ABNORMAL LOW (ref 13.0–17.0)
MCH: 25.3 pg — AB (ref 26.0–34.0)
MCHC: 30.2 g/dL (ref 30.0–36.0)
MCV: 83.9 fL (ref 78.0–100.0)
PLATELETS: 249 10*3/uL (ref 150–400)
RBC: 3.16 MIL/uL — AB (ref 4.22–5.81)
RDW: 17.9 % — AB (ref 11.5–15.5)
WBC: 8.8 10*3/uL (ref 4.0–10.5)

## 2017-04-13 LAB — GLUCOSE, CAPILLARY
Glucose-Capillary: 91 mg/dL (ref 65–99)
Glucose-Capillary: 86 mg/dL (ref 65–99)

## 2017-04-13 LAB — APTT: aPTT: 47 seconds — ABNORMAL HIGH (ref 24–36)

## 2017-04-13 MED ORDER — FENTANYL CITRATE (PF) 100 MCG/2ML IJ SOLN
INTRAMUSCULAR | Status: AC
  Filled 2017-04-13: qty 2

## 2017-04-13 MED ORDER — VANCOMYCIN HCL IN DEXTROSE 1-5 GM/200ML-% IV SOLN
1000.0000 mg | INTRAVENOUS | Status: AC
  Administered 2017-04-13: 1000 mg via INTRAVENOUS

## 2017-04-13 MED ORDER — LIDOCAINE HCL (PF) 1 % IJ SOLN
INTRAMUSCULAR | Status: AC
  Filled 2017-04-13: qty 30

## 2017-04-13 MED ORDER — FENTANYL CITRATE (PF) 100 MCG/2ML IJ SOLN
INTRAMUSCULAR | Status: AC | PRN
  Administered 2017-04-13: 50 ug via INTRAVENOUS

## 2017-04-13 MED ORDER — SODIUM CHLORIDE 0.9 % IV SOLN: INTRAVENOUS | Status: DC

## 2017-04-13 MED ORDER — HEPARIN SODIUM (PORCINE) 1000 UNIT/ML IJ SOLN
INTRAMUSCULAR | Status: AC
  Filled 2017-04-13: qty 1

## 2017-04-13 MED ORDER — VANCOMYCIN HCL IN DEXTROSE 1-5 GM/200ML-% IV SOLN
INTRAVENOUS | Status: AC
  Filled 2017-04-13: qty 200

## 2017-04-13 MED ORDER — MIDAZOLAM HCL 2 MG/2ML IJ SOLN
INTRAMUSCULAR | Status: AC
  Filled 2017-04-13: qty 2

## 2017-04-13 MED ORDER — HEPARIN SODIUM (PORCINE) 1000 UNIT/ML IJ SOLN
INTRAMUSCULAR | Status: AC | PRN
  Administered 2017-04-13: 3.6 mL

## 2017-04-13 MED ORDER — LIDOCAINE HCL 1 % IJ SOLN
INTRAMUSCULAR | Status: AC | PRN
  Administered 2017-04-13: 15 mL

## 2017-04-13 MED ORDER — MIDAZOLAM HCL 2 MG/2ML IJ SOLN
INTRAMUSCULAR | Status: AC | PRN
  Administered 2017-04-13: 1 mg via INTRAVENOUS

## 2017-04-13 NOTE — Procedures (Signed)
Interventional Radiology Procedure Note  Procedure: Placement of a right IJ approach 19cm tip to cuff HD catheter.  Tip is positioned at the superior cavoatrial junction and catheter is ready for immediate use.  Complications: None Recommendations:  - Ok to shower tomorrow - Do not submerge  - Routine line care   Signed,  Dulcy Fanny. Earleen Newport, DO

## 2017-04-13 NOTE — Progress Notes (Signed)
Attempted to call report to [place].  No answer on either phone, left message to call for report.  Has not called when pt to be discharged.  Written Instructions given to patient to take to center.

## 2017-04-13 NOTE — Progress Notes (Signed)
Unable to complete med rec. Pharmacy tech called to review.

## 2017-04-13 NOTE — Discharge Instructions (Signed)
Moderate Conscious Sedation, Adult, Care After These instructions provide you with information about caring for yourself after your procedure. Your health care provider may also give you more specific instructions. Your treatment has been planned according to current medical practices, but problems sometimes occur. Call your health care provider if you have any problems or questions after your procedure. What can I expect after the procedure? After your procedure, it is common:  To feel sleepy for several hours.  To feel clumsy and have poor balance for several hours.  To have poor judgment for several hours.  To vomit if you eat too soon.  Follow these instructions at home: For at least 24 hours after the procedure:   Do not: ? Participate in activities where you could fall or become injured. ? Drive. ? Use heavy machinery. ? Drink alcohol. ? Take sleeping pills or medicines that cause drowsiness. ? Make important decisions or sign legal documents. ? Take care of children on your own.  Rest. Eating and drinking  Follow the diet recommended by your health care provider.  If you vomit: ? Drink water, juice, or soup when you can drink without vomiting. ? Make sure you have little or no nausea before eating solid foods. General instructions  Have a responsible adult stay with you until you are awake and alert.  Take over-the-counter and prescription medicines only as told by your health care provider.  If you smoke, do not smoke without supervision.  Keep all follow-up visits as told by your health care provider. This is important. Contact a health care provider if:  You keep feeling nauseous or you keep vomiting.  You feel light-headed.  You develop a rash.  You have a fever. Get help right away if:  You have trouble breathing. This information is not intended to replace advice given to you by your health care provider. Make sure you discuss any questions you have  with your health care provider. Document Released: 05/10/2013 Document Revised: 12/23/2015 Document Reviewed: 11/09/2015 Elsevier Interactive Patient Education  2018 [employer]. Vascular Access for Hemodialysis A vascular access is a connection between two blood vessels that allows blood to be easily removed from the body and returned to the body during hemodialysis. Hemodialysis is a procedure in which a machine outside of the body filters the blood. There are three types of vascular accesses:  Arteriovenous fistula. This is a connection between an artery and a vein (usually in the arm) that is made by sewing them together. Blood in the artery flows directly into the vein, causing it to get larger over time. This makes it easier for the vein to be used for hemodialysis. An arteriovenous fistula takes 1-6 months to develop after surgery.  Arteriovenous graft. This is a connection between an artery and a vein in the arm that is made with a tube. An arteriovenous graft can be used within 2-3 weeks of surgery.  Venous catheter. This is a thin, flexible tube that is placed in a large vein (usually in the neck, chest, or groin). A venous catheter for hemodialysis contains two tubes that come out of the skin. A venous catheter can be used right away. It is usually used as a temporary access if you need hemodialysis before a fistula or graft has developed. It may also be used as a permanent access if a fistula or graft cannot be created.  Which type of access is best for me? The type of access that is best for you  depends on the size and strength of your veins. A fistula is usually the preferred type of access. It can last several years and is less likely than the other types of accesses to become infected or to cause blood clots within a blood vessel (thrombosis). However, a fistula is not an option for everyone. If your veins are not the right size, a graft may be used instead. Grafts require you to have  strong veins. If your veins are not strong enough for a graft, a catheter may be used. Catheters are more likely than fistulas and grafts to become infected or to have thrombosis. Sometimes, only one type of access is an option. Your health care provider will help you determine which type of access is best for you. How is a vascular access used? The way the access is used depends on the type of access:  If the access is a fistula or graft, two needles are inserted through the skin into the access before each hemodialysis session. Blood leaves the body through one of the needles and travels through a tube to the hemodialysis machine (dialyzer). It then flows through another tube and returns to the body through the second needle.  If the access is a catheter, one tube is connected directly to the tube that leads to the dialyzer and the other is connected to a tube that leads away from the dialyzer. Blood leaves the body through one tube and returns to the body through the other.  What kind of problems can occur with vascular accesses?  Blood clots within a blood vessel (thrombosis). Thrombosis can lead to a narrowing of a blood vessel or tube (stenosis). If thrombosis occurs frequently, another access site may be created as a backup.  Infection. These problems are most likely to occur with a venous catheter and least likely to occur with an arteriovenous fistula. How do I care for my vascular access? Wear a medical alert bracelet. This tells health care providers that you are a dialysis patient in the case of an emergency and allows them to care for your veins appropriately. If you have a graft or fistula:  A "bruit" is a noise that is heard with a stethoscope and a "thrill" is a vibration felt over the graft or fistula. The presence of the bruit and thrill indicates that the access is working. You will be taught to feel for the thrill each day. If this is not felt, the access may be clotted. Call  your health care provider.  You may use the arm where your vascular access is located freely after the site heals. Keep the following in mind: ? Avoid pressure on the arm. ? Avoid lifting heavy objects with the arm. ? Avoid sleeping on the arm. ? Avoid wearing tight-sleeved shirts or jewelry around the graft or fistula.  Do not allow blood pressure monitoring or needle punctures on the side where the graft or fistula is located.  With permission from your health care provider, you may do exercises to help with blood flow through a fistula. These exercises involve squeezing a rubber ball or other soft objects as instructed.  Contact a health care provider if:  Chills develop.  You have an oral temperature above 102 F (38.9 C).  Swelling around the graft or fistula gets worse.  New pain develops.  Pus or other fluid (drainage) is seen at the vascular access site.  Skin redness or red streaking is seen on the skin around, above, or  below the vascular access. Get help right away if:  Pain, numbness, or an unusual pale skin color develops in the hand on the side of your fistula.  Dizziness or weakness develops that you have not had before.  The vascular access has bleeding that cannot be easily controlled. This information is not intended to replace advice given to you by your health care provider. Make sure you discuss any questions you have with your health care provider. Document Released: 10/10/2002 Document Revised: 12/26/2015 Document Reviewed: 12/06/2012 Elsevier Interactive Patient Education  2017 [employer].

## 2017-04-13 NOTE — Progress Notes (Signed)
Arrived to unit with Vancomycin infusing.  Patient currently in stable condition.  Reported off to [place].

## 2017-04-13 NOTE — H&P (Signed)
Chief Complaint: clotted RUE fistula  Referring Physician:Dr. Fran Lowes  Supervising Physician: Corrie Mckusick  Patient Status: [facility] - Out-pt  HPI: Jacob [name] is a 66 y.o. male with a history of multiple medical problems including ESRD on HD.  He has a RUE fistula which has now clotted.  He was scheduled today to have a declot, but unfortunately was unable to take his pre-meds for his contrast allergy.  He has now been set up for a perm cath placement.  He last had HD on Saturday.  When he returned on Monday it was not working.  He presents today for catheter placement so he can get HD.  Past Medical History:  Past Medical History:  Diagnosis Date  . Anemia   . Anemia in chronic kidney disease(285.21)   . Anxiety   . Arthritis   . Depression   . Dyspnea    with exertion  . ESRD (end stage renal disease) on dialysis [facility])    "MWF; [facility] [place]" (02/18/2017)  . Essential hypertension   . GERD (gastroesophageal reflux disease)   . Glaucoma   . History of blood transfusion   . Insomnia   . Nonischemic cardiomyopathy ([place])   . Stroke [facility]) 2016   - "they said it was not a stroke"  . Type 2 diabetes mellitus ([place])    Type II    Past Surgical History:  Past Surgical History:  Procedure Laterality Date  . A/V FISTULAGRAM Right 10/06/2016   Procedure: A/V Fistulagram;  Surgeon: [name], MD;  Location: [place] [facility];  Service: Cardiovascular;  Laterality: Right;  . ABDOMINAL AORTOGRAM N/A 12/30/2016   Procedure: Abdominal Aortogram;  Surgeon: Waynetta Sandy, MD;  Location: [place] [facility];  Service: Cardiovascular;  Laterality: N/A;  . ABDOMINAL AORTOGRAM N/A 04/08/2017   Procedure: ABDOMINAL AORTOGRAM;  Surgeon: Conrad Westway, MD;  Location: [place] [facility];  Service: Cardiovascular;  Laterality: N/A;  . ABDOMINAL AORTOGRAM W/LOWER EXTREMITY N/A 02/23/2017   Procedure: Abdominal Aortogram w/Lower Extremity;  Surgeon: [name], MD;   Location: [place] [facility];  Service: Cardiovascular;  Laterality: N/A;  Rt. leg  . AMPUTATION Right 02/28/2017   Procedure: RIGHT BELOW KNEE AMPUTATION;  Surgeon: [name], MD;  Location: [place];  Service: Vascular;  Laterality: Right;  . AV FISTULA PLACEMENT     Hx: of  . AV FISTULA PLACEMENT Right 05/04/2013   Procedure: ARTERIOVENOUS (AV) FISTULA CREATION- RIGHT ARM;  Surgeon: Conrad [place], MD;  Location: [place];  Service: Vascular;  Laterality: Right;  Ultrasound guided  . AV FISTULA PLACEMENT Right 07/28/2016   Procedure: BRACHIOCEPHALIC ARTERIOVENOUS (AV) FISTULA CREATION;  Surgeon: [name], MD;  Location: [place];  Service: Vascular;  Laterality: Right;  . CATARACT EXTRACTION W/PHACO Left 07/16/2014   Procedure: CATARACT EXTRACTION PHACO AND INTRAOCULAR LENS PLACEMENT (IOC);  Surgeon: [name], MD;  Location: [facility];  Service: Ophthalmology;  Laterality: Left;  CDE:8.86  . CATARACT EXTRACTION W/PHACO Right 07/30/2014   Procedure: CATARACT EXTRACTION PHACO AND INTRAOCULAR LENS PLACEMENT (IOC);  Surgeon: [name], MD;  Location: [facility];  Service: Ophthalmology;  Laterality: Right;  CDE 8.99  . COLONOSCOPY     Hx: of  . FISTULA SUPERFICIALIZATION Right 07/28/2016   Procedure: FISTULA SUPERFICIALIZATION;  Surgeon: [name], MD;  Location: [place];  Service: Vascular;  Laterality: Right;  . FISTULA SUPERFICIALIZATION Right 10/13/2016   Procedure: BRACHIOCEPHALIC ARTERIOVENOUS FISTULA SUPERFICIALIZATION;  Surgeon: [name], MD;  Location: [place];  Service: Vascular;  Laterality: Right;  . FISTULOGRAM N/A 05/04/2013   Procedure: CENTRAL VENOGRAM;  Surgeon: Conrad Gasconade, MD;  Location: [place];  Service: Vascular;  Laterality: N/A;  . INSERTION OF DIALYSIS CATHETER Right 05/04/2013   Procedure: INSERTION OF DIALYSIS CATHETER;  Surgeon: Conrad [place], MD;  Location: [place];  Service: Vascular;  Laterality: Right;  Ultrasound guided  . INSERTION OF DIALYSIS  CATHETER N/A 07/28/2016   Procedure: INSERTION OF DIALYSIS CATHETER;  Surgeon: [name], MD;  Location: [place];  Service: Vascular;  Laterality: N/A;  . LIGATION OF ARTERIOVENOUS  FISTULA Left 05/04/2013   Procedure: LIGATION OF LEFT RADIAL CEPHALIC ARTERIOVENOUS  FISTULA;  Surgeon: Conrad [place], MD;  Location: [place];  Service: Vascular;  Laterality: Left;  Ultrasound guided  . LIGATION OF ARTERIOVENOUS  FISTULA Right 07/28/2016   Procedure: LIGATION OF RADIOCEPHALIC ARTERIOVENOUS  FISTULA;  Surgeon: [name], MD;  Location: [place];  Service: Vascular;  Laterality: Right;  . LOWER EXTREMITY ANGIOGRAPHY N/A 12/30/2016   Procedure: Lower Extremity Angiography;  Surgeon: Waynetta Sandy, MD;  Location: [place] [facility];  Service: Cardiovascular;  Laterality: N/A;  . LUMBAR SPINE SURGERY    . PERIPHERAL VASCULAR ATHERECTOMY Right 12/30/2016   Procedure: Peripheral Vascular Atherectomy;  Surgeon: Waynetta Sandy, MD;  Location: [place] [facility];  Service: Cardiovascular;  Laterality: Right;  AT and PT  . PERIPHERAL VASCULAR BALLOON ANGIOPLASTY Right 12/30/2016   Procedure: Peripheral Vascular Balloon Angioplasty;  Surgeon: Waynetta Sandy, MD;  Location: [place] [facility];  Service: Cardiovascular;  Laterality: Right;  PTA of PT and AT  . PERIPHERAL VASCULAR BALLOON ANGIOPLASTY  02/23/2017   Procedure: Peripheral Vascular Balloon Angioplasty;  Surgeon: [name], MD;  Location: [place] [facility];  Service: Cardiovascular;;  RT. Anterior Tib.  [name] PERIPHERAL VASCULAR BALLOON ANGIOPLASTY  04/08/2017   Procedure: PERIPHERAL VASCULAR BALLOON ANGIOPLASTY;  Surgeon: Conrad Buhl, MD;  Location: [place] [facility];  Service: Cardiovascular;;  . PERIPHERAL VASCULAR CATHETERIZATION N/A 01/24/2015   Procedure: Fistulagram;  Surgeon: Conrad Mulino, MD;  Location: [place] [facility];  Service: Cardiovascular;  Laterality: N/A;  . PERIPHERAL VASCULAR  CATHETERIZATION Right 06/04/2016   Procedure: A/V Shuntogram/Fistulagram;  Surgeon: Conrad Clarke, MD;  Location: [place] [facility];  Service: Cardiovascular;  Laterality: Right;  . REVISON OF ARTERIOVENOUS FISTULA Right 01/02/2014   Procedure: REVISON OF ARTERIOVENOUS FISTULA ANASTOMOSIS;  Surgeon: Conrad Dragoon, MD;  Location: [place];  Service: Vascular;  Laterality: Right;  . REVISON OF ARTERIOVENOUS FISTULA Right 01/02/2016   Procedure: REVISION OF RADIOCEPHALIC ARTERIOVENOUS FISTULA  with BOVINE PATCH ANGIOPLASTY RIGHT RADIAL ARTERY;  Surgeon: Mal [name], MD;  Location: [place];  Service: Vascular;  Laterality: Right;  . SHUNTOGRAM N/A 11/07/2013   Procedure: Fistulogram;  Surgeon: [name], MD;  Location: [facility] CATH LAB;  Service: Cardiovascular;  Laterality: N/A;  . TRANSMETATARSAL AMPUTATION Right 01/01/2017   Procedure: TRANSMETATARSAL AMPUTATION;  Surgeon: [name], MD;  Location: [facility] OR;  Service: Vascular;  Laterality: Right;    Family History:  Family History  Problem Relation Age of Onset  . Heart attack Brother        5  . Heart disease Brother        before age 74  . Hypertension Brother   . Heart attack Brother  25  . Heart attack Brother        46  . Diabetes Mother   . Hypertension Mother   . Heart disease Mother   . Heart disease Father   . Hypertension Father   . Other Father        amputation  . Hypertension Sister   . Heart disease Sister   . Vision loss Maternal Uncle     Social History:  reports that he has never smoked. He has never used smokeless tobacco. He reports that he drinks alcohol. He reports that he does not use drugs.  Allergies:  Allergies  Allergen Reactions  . Bee Venom Anaphylaxis  . Iodine Swelling  . Penicillins Swelling and Other (See Comments)    SWELLING REACTION UNSPECIFIED   Has patient had a PCN reaction causing immediate rash, facial/tongue/throat swelling, SOB or lightheadedness with hypotension: No Has patient  had a PCN reaction causing severe rash involving mucus membranes or skin necrosis: No Has patient had a PCN reaction that required hospitalization No Has patient had a PCN reaction occurring within the last 10 years: No If all of the above answers are "NO", then may proceed with Cephalosporin use.  . Sulfadiazine Other (See Comments)    1% Silver Sulfadiazine cream causes burning over a large area of skin.  [name] Plavix [Clopidogrel] Other (See Comments)    Caused Nose bleed    Medications: Medications reviewed in epic.  Please HPI for pertinent positives, otherwise complete 10 system ROS negative.  Mallampati Score: MD Evaluation Airway: WNL Heart: WNL Abdomen: WNL Chest/ Lungs: WNL ASA  Classification: 3 Mallampati/Airway Score: Two  Physical Exam: BP (!) 143/70   Pulse (!) 58   Temp 98.7 F (37.1 C)   Ht 6\' 2"  (1.88 m)   Wt 257 lb (116.6 kg)   SpO2 99%   BMI 33.00 kg/m  Body mass index is 33 kg/m. General: pleasant, obese black male who is laying in bed in NAD HEENT: head is normocephalic, atraumatic.  Sclera are noninjected.  PERRL.  Ears and nose without any masses or lesions.  Mouth is pink and moist Heart: regular, rate, and rhythm.  Normal s1,s2. No obvious murmurs, gallops, or rubs noted.   Lungs: CTAB, no wheezes, rhonchi, or rales noted.  Respiratory effort nonlabored Abd: soft, NT, ND, +BS, no masses, hernias, or organomegaly, obese MS: RUE fistula with no thrill Psych: A&Ox3 with an appropriate affect.   Labs: Pending   Imaging: No results found.  Assessment/Plan 1. Clotted AVF of RUE  Unfortunately, the patient was not given his pre-meds for his contrast allergy so we are unable to proceed with a declot.  We will plan on placement of a permcath.  If his K is too high, then apparently, he may be admitted for observation to get HD so we can plan for a declot at some point when he and we are able.   Risks and benefits discussed with the patient including,  but not limited to bleeding, infection, vascular injury, pneumothorax which may require chest tube placement, air embolism or even death All of the patient's questions were answered, patient is agreeable to proceed. Consent signed and in chart.  Thank you for this interesting consult.  I greatly enjoyed meeting Jacob [name] and look forward to participating in their care.  A copy of this report was sent to the requesting provider on this date.  Electronically Signed: Henreitta Cea 04/13/2017, 12:55 PM   I spent a  total of  30 Minutes   in face to face in clinical consultation, greater than 50% of which was counseling/coordinating care for clotted RUE AVF

## 2017-04-14 DIAGNOSIS — N186 End stage renal disease: Secondary | ICD-10-CM | POA: Diagnosis not present

## 2017-04-14 DIAGNOSIS — Z992 Dependence on renal dialysis: Secondary | ICD-10-CM | POA: Diagnosis not present

## 2017-04-14 DIAGNOSIS — Z23 Encounter for immunization: Secondary | ICD-10-CM | POA: Diagnosis not present

## 2017-04-14 DIAGNOSIS — N2581 Secondary hyperparathyroidism of renal origin: Secondary | ICD-10-CM | POA: Diagnosis not present

## 2017-04-14 DIAGNOSIS — D509 Iron deficiency anemia, unspecified: Secondary | ICD-10-CM | POA: Diagnosis not present

## 2017-04-14 DIAGNOSIS — D631 Anemia in chronic kidney disease: Secondary | ICD-10-CM | POA: Diagnosis not present

## 2017-04-15 ENCOUNTER — Other Ambulatory Visit (HOSPITAL_COMMUNITY): Payer: Self-pay | Admitting: Nephrology

## 2017-04-15 DIAGNOSIS — N186 End stage renal disease: Secondary | ICD-10-CM

## 2017-04-16 DIAGNOSIS — D631 Anemia in chronic kidney disease: Secondary | ICD-10-CM | POA: Diagnosis not present

## 2017-04-16 DIAGNOSIS — D509 Iron deficiency anemia, unspecified: Secondary | ICD-10-CM | POA: Diagnosis not present

## 2017-04-16 DIAGNOSIS — Z992 Dependence on renal dialysis: Secondary | ICD-10-CM | POA: Diagnosis not present

## 2017-04-16 DIAGNOSIS — N2581 Secondary hyperparathyroidism of renal origin: Secondary | ICD-10-CM | POA: Diagnosis not present

## 2017-04-16 DIAGNOSIS — Z23 Encounter for immunization: Secondary | ICD-10-CM | POA: Diagnosis not present

## 2017-04-16 DIAGNOSIS — N186 End stage renal disease: Secondary | ICD-10-CM | POA: Diagnosis not present

## 2017-04-19 ENCOUNTER — Other Ambulatory Visit: Payer: Self-pay | Admitting: Student

## 2017-04-19 DIAGNOSIS — D509 Iron deficiency anemia, unspecified: Secondary | ICD-10-CM | POA: Diagnosis not present

## 2017-04-19 DIAGNOSIS — D631 Anemia in chronic kidney disease: Secondary | ICD-10-CM | POA: Diagnosis not present

## 2017-04-19 DIAGNOSIS — Z992 Dependence on renal dialysis: Secondary | ICD-10-CM | POA: Diagnosis not present

## 2017-04-19 DIAGNOSIS — N2581 Secondary hyperparathyroidism of renal origin: Secondary | ICD-10-CM | POA: Diagnosis not present

## 2017-04-19 DIAGNOSIS — Z23 Encounter for immunization: Secondary | ICD-10-CM | POA: Diagnosis not present

## 2017-04-19 DIAGNOSIS — N186 End stage renal disease: Secondary | ICD-10-CM | POA: Diagnosis not present

## 2017-04-20 ENCOUNTER — Ambulatory Visit (HOSPITAL_COMMUNITY)
Admission: RE | Admit: 2017-04-20 | Discharge: 2017-04-20 | Disposition: A | Discharge status: Home / Self Care | Payer: Medicare Other | Source: Ambulatory Visit | Attending: Nephrology | Admitting: Nephrology

## 2017-04-20 ENCOUNTER — Encounter (HOSPITAL_COMMUNITY): Payer: Self-pay

## 2017-04-20 DIAGNOSIS — Z5309 Procedure and treatment not carried out because of other contraindication: Secondary | ICD-10-CM | POA: Diagnosis not present

## 2017-04-20 DIAGNOSIS — N186 End stage renal disease: Secondary | ICD-10-CM | POA: Insufficient documentation

## 2017-04-20 DIAGNOSIS — T82868A Thrombosis of vascular prosthetic devices, implants and grafts, initial encounter: Secondary | ICD-10-CM | POA: Insufficient documentation

## 2017-04-20 DIAGNOSIS — Y832 Surgical operation with anastomosis, bypass or graft as the cause of abnormal reaction of the patient, or of later complication, without mention of misadventure at the time of the procedure: Secondary | ICD-10-CM | POA: Insufficient documentation

## 2017-04-20 DIAGNOSIS — Z992 Dependence on renal dialysis: Secondary | ICD-10-CM | POA: Insufficient documentation

## 2017-04-20 DIAGNOSIS — E1122 Type 2 diabetes mellitus with diabetic chronic kidney disease: Secondary | ICD-10-CM | POA: Insufficient documentation

## 2017-04-20 LAB — GLUCOSE, CAPILLARY: GLUCOSE-CAPILLARY: 201 mg/dL — AB (ref 65–99)

## 2017-04-20 MED ORDER — IOPAMIDOL (ISOVUE-300) INJECTION 61%
INTRAVENOUS | Status: AC
  Filled 2017-04-20: qty 50

## 2017-04-20 MED ORDER — LIDOCAINE HCL (PF) 1 % IJ SOLN
INTRAMUSCULAR | Status: AC
  Filled 2017-04-20: qty 30

## 2017-04-20 MED ORDER — IOPAMIDOL (ISOVUE-300) INJECTION 61%
INTRAVENOUS | Status: AC
  Filled 2017-04-20: qty 100

## 2017-04-20 NOTE — Progress Notes (Signed)
Patient ID: Jacob [name], male   DOB: Dec 11, 1950, 66 y.o.   MRN: [id number] 66 yo with ESRD and right upper arm fistula. According to the patient, the fistula has been clotted for a couple of weeks.  Patient currently has a chest HD catheter that is functioning.  The right upper arm was evaluated with [place].  The fistula anastomosis is patient and there is a complex large pseudoaneurysm in the upper arm.  There is pulsatile flow in this large pseudoaneurysm but the outflow cephalic vein is small and thrombosed.  Not clear if the pseudoaneurysm is chronic but the configuration raises concern for fistula rupture at some point in time.  This anatomy is not amendable to a declot procedure and recommend surgery consultation.  Discussed with Dr. Lowanda Foster.

## 2017-04-20 NOTE — Progress Notes (Signed)
Procedure cancelled per Dr. Anselm Pancoast. Pt D/C home in wheelchair with transport. Pt awake and alert. In no distress.

## 2017-04-21 DIAGNOSIS — N2581 Secondary hyperparathyroidism of renal origin: Secondary | ICD-10-CM | POA: Diagnosis not present

## 2017-04-21 DIAGNOSIS — D631 Anemia in chronic kidney disease: Secondary | ICD-10-CM | POA: Diagnosis not present

## 2017-04-21 DIAGNOSIS — Z992 Dependence on renal dialysis: Secondary | ICD-10-CM | POA: Diagnosis not present

## 2017-04-21 DIAGNOSIS — N186 End stage renal disease: Secondary | ICD-10-CM | POA: Diagnosis not present

## 2017-04-21 DIAGNOSIS — D509 Iron deficiency anemia, unspecified: Secondary | ICD-10-CM | POA: Diagnosis not present

## 2017-04-21 DIAGNOSIS — Z23 Encounter for immunization: Secondary | ICD-10-CM | POA: Diagnosis not present

## 2017-04-22 DIAGNOSIS — Z4781 Encounter for orthopedic aftercare following surgical amputation: Secondary | ICD-10-CM | POA: Diagnosis not present

## 2017-04-22 DIAGNOSIS — I639 Cerebral infarction, unspecified: Secondary | ICD-10-CM | POA: Diagnosis not present

## 2017-04-22 DIAGNOSIS — R040 Epistaxis: Secondary | ICD-10-CM | POA: Diagnosis not present

## 2017-04-22 DIAGNOSIS — N186 End stage renal disease: Secondary | ICD-10-CM | POA: Diagnosis not present

## 2017-04-23 ENCOUNTER — Encounter: Payer: Self-pay | Admitting: Vascular Surgery

## 2017-04-23 ENCOUNTER — Ambulatory Visit (INDEPENDENT_AMBULATORY_CARE_PROVIDER_SITE_OTHER): Payer: Medicare Other | Admitting: Vascular Surgery

## 2017-04-23 VITALS — BP 168/84 | HR 70 | Temp 97.0°F | Resp 20 | Ht 74.0 in

## 2017-04-23 DIAGNOSIS — Z992 Dependence on renal dialysis: Secondary | ICD-10-CM | POA: Diagnosis not present

## 2017-04-23 DIAGNOSIS — N186 End stage renal disease: Secondary | ICD-10-CM

## 2017-04-23 DIAGNOSIS — I779 Disorder of arteries and arterioles, unspecified: Secondary | ICD-10-CM

## 2017-04-23 DIAGNOSIS — Z89511 Acquired absence of right leg below knee: Secondary | ICD-10-CM | POA: Diagnosis not present

## 2017-04-23 DIAGNOSIS — Z8781 Personal history of (healed) traumatic fracture: Secondary | ICD-10-CM | POA: Diagnosis not present

## 2017-04-23 DIAGNOSIS — E119 Type 2 diabetes mellitus without complications: Secondary | ICD-10-CM | POA: Diagnosis not present

## 2017-04-23 NOTE — Progress Notes (Signed)
Established Dialysis Access   History of Present Illness   Jacob [name] is a 66 y.o. (10-14-1950) male who presents for re-evaluation of R BC AVF.  Patient has been have flow rate issues along with prolonged bleeding.  The patient was referred to IR for intervention but they refused on 04/20/17.  The patient current completes HD via a TDC in the [facility].  Past Medical History:  Diagnosis Date  . Anemia   . Anemia in chronic kidney disease(285.21)   . Anxiety   . Arthritis   . Depression   . Dyspnea    with exertion  . ESRD (end stage renal disease) on dialysis [facility])    "MWF; [facility] [place]" (02/18/2017)  . Essential hypertension   . GERD (gastroesophageal reflux disease)   . Glaucoma   . History of blood transfusion   . Insomnia   . Nonischemic cardiomyopathy ([place])   . Stroke [facility]) 2016   - "they said it was not a stroke"  . Type 2 diabetes mellitus ([place])    Type II    Past Surgical History:  Procedure Laterality Date  . A/V FISTULAGRAM Right 10/06/2016   Procedure: A/V Fistulagram;  Surgeon: [name], MD;  Location: [place] [facility];  Service: Cardiovascular;  Laterality: Right;  . ABDOMINAL AORTOGRAM N/A 12/30/2016   Procedure: Abdominal Aortogram;  Surgeon: Waynetta Sandy, MD;  Location: [place] [facility];  Service: Cardiovascular;  Laterality: N/A;  . ABDOMINAL AORTOGRAM N/A 04/08/2017   Procedure: ABDOMINAL AORTOGRAM;  Surgeon: Conrad Russell, MD;  Location: [place] [facility];  Service: Cardiovascular;  Laterality: N/A;  . ABDOMINAL AORTOGRAM W/LOWER EXTREMITY N/A 02/23/2017   Procedure: Abdominal Aortogram w/Lower Extremity;  Surgeon: [name], MD;  Location: [place] [facility];  Service: Cardiovascular;  Laterality: N/A;  Rt. leg  . AMPUTATION Right 02/28/2017   Procedure: RIGHT BELOW KNEE AMPUTATION;  Surgeon: [name], MD;  Location: [place];  Service: Vascular;  Laterality: Right;  . AV FISTULA PLACEMENT     Hx: of  . AV FISTULA PLACEMENT  Right 05/04/2013   Procedure: ARTERIOVENOUS (AV) FISTULA CREATION- RIGHT ARM;  Surgeon: Conrad Slinger, MD;  Location: [place];  Service: Vascular;  Laterality: Right;  Ultrasound guided  . AV FISTULA PLACEMENT Right 07/28/2016   Procedure: BRACHIOCEPHALIC ARTERIOVENOUS (AV) FISTULA CREATION;  Surgeon: [name], MD;  Location: [place];  Service: Vascular;  Laterality: Right;  . CATARACT EXTRACTION W/PHACO Left 07/16/2014   Procedure: CATARACT EXTRACTION PHACO AND INTRAOCULAR LENS PLACEMENT (IOC);  Surgeon: [name], MD;  Location: [facility];  Service: Ophthalmology;  Laterality: Left;  CDE:8.86  . CATARACT EXTRACTION W/PHACO Right 07/30/2014   Procedure: CATARACT EXTRACTION PHACO AND INTRAOCULAR LENS PLACEMENT (IOC);  Surgeon: [name], MD;  Location: [facility];  Service: Ophthalmology;  Laterality: Right;  CDE 8.99  . COLONOSCOPY     Hx: of  . FISTULA SUPERFICIALIZATION Right 07/28/2016   Procedure: FISTULA SUPERFICIALIZATION;  Surgeon: [name], MD;  Location: [place];  Service: Vascular;  Laterality: Right;  . FISTULA SUPERFICIALIZATION Right 10/13/2016   Procedure: BRACHIOCEPHALIC ARTERIOVENOUS FISTULA SUPERFICIALIZATION;  Surgeon: [name], MD;  Location: [place];  Service: Vascular;  Laterality: Right;  . FISTULOGRAM N/A 05/04/2013   Procedure: CENTRAL VENOGRAM;  Surgeon: Conrad Irwindale, MD;  Location: [place];  Service: Vascular;  Laterality: N/A;  . INSERTION OF DIALYSIS CATHETER Right 05/04/2013   Procedure: INSERTION OF DIALYSIS CATHETER;  Surgeon: Conrad [place], MD;  Location: [place];  Service: Vascular;  Laterality: Right;  Ultrasound guided  . INSERTION OF DIALYSIS CATHETER N/A 07/28/2016   Procedure: INSERTION OF DIALYSIS CATHETER;  Surgeon: [name], MD;  Location: [place];  Service: Vascular;  Laterality: N/A;  . IR FLUORO GUIDE CV LINE RIGHT  04/13/2017  . IR US GUIDE VASC ACCESS RIGHT  04/13/2017  . LIGATION OF ARTERIOVENOUS  FISTULA Left 05/04/2013    Procedure: LIGATION OF LEFT RADIAL CEPHALIC ARTERIOVENOUS  FISTULA;  Surgeon: Conrad Newport, MD;  Location: [place];  Service: Vascular;  Laterality: Left;  Ultrasound guided  . LIGATION OF ARTERIOVENOUS  FISTULA Right 07/28/2016   Procedure: LIGATION OF RADIOCEPHALIC ARTERIOVENOUS  FISTULA;  Surgeon: [name], MD;  Location: [place];  Service: Vascular;  Laterality: Right;  . LOWER EXTREMITY ANGIOGRAPHY N/A 12/30/2016   Procedure: Lower Extremity Angiography;  Surgeon: Waynetta Sandy, MD;  Location: [place] [facility];  Service: Cardiovascular;  Laterality: N/A;  . LUMBAR SPINE SURGERY    . PERIPHERAL VASCULAR ATHERECTOMY Right 12/30/2016   Procedure: Peripheral Vascular Atherectomy;  Surgeon: Waynetta Sandy, MD;  Location: [place] [facility];  Service: Cardiovascular;  Laterality: Right;  AT and PT  . PERIPHERAL VASCULAR BALLOON ANGIOPLASTY Right 12/30/2016   Procedure: Peripheral Vascular Balloon Angioplasty;  Surgeon: Waynetta Sandy, MD;  Location: [place] [facility];  Service: Cardiovascular;  Laterality: Right;  PTA of PT and AT  . PERIPHERAL VASCULAR BALLOON ANGIOPLASTY  02/23/2017   Procedure: Peripheral Vascular Balloon Angioplasty;  Surgeon: [name], MD;  Location: [place] [facility];  Service: Cardiovascular;;  RT. Anterior Tib.  [name] PERIPHERAL VASCULAR BALLOON ANGIOPLASTY  04/08/2017   Procedure: PERIPHERAL VASCULAR BALLOON ANGIOPLASTY;  Surgeon: Conrad Lonsdale, MD;  Location: [place] [facility];  Service: Cardiovascular;;  . PERIPHERAL VASCULAR CATHETERIZATION N/A 01/24/2015   Procedure: Fistulagram;  Surgeon: Conrad Durand, MD;  Location: [place] [facility];  Service: Cardiovascular;  Laterality: N/A;  . PERIPHERAL VASCULAR CATHETERIZATION Right 06/04/2016   Procedure: A/V Shuntogram/Fistulagram;  Surgeon: Conrad [place], MD;  Location: [place] [facility];  Service: Cardiovascular;  Laterality: Right;  . REVISON OF ARTERIOVENOUS FISTULA Right  01/02/2014   Procedure: REVISON OF ARTERIOVENOUS FISTULA ANASTOMOSIS;  Surgeon: Conrad [place], MD;  Location: [place];  Service: Vascular;  Laterality: Right;  . REVISON OF ARTERIOVENOUS FISTULA Right 01/02/2016   Procedure: REVISION OF RADIOCEPHALIC ARTERIOVENOUS FISTULA  with BOVINE PATCH ANGIOPLASTY RIGHT RADIAL ARTERY;  Surgeon: Mal [name], MD;  Location: [place];  Service: Vascular;  Laterality: Right;  . SHUNTOGRAM N/A 11/07/2013   Procedure: Fistulogram;  Surgeon: [name], MD;  Location: [facility];  Service: Cardiovascular;  Laterality: N/A;  . TRANSMETATARSAL AMPUTATION Right 01/01/2017   Procedure: TRANSMETATARSAL AMPUTATION;  Surgeon: [name], MD;  Location: [facility] OR;  Service: Vascular;  Laterality: Right;    Social History   Social History  . Marital status: Divorced    Spouse name: N/A  . Number of children: N/A  . Years of education: N/A   Occupational History  . Not on file.   Social History Main Topics  . Smoking status: Never Smoker  . Smokeless tobacco: Never Used  . Alcohol use Yes     Comment: 1- fifth of gin a week  . Drug use: No  . Sexual activity: Yes    Birth control/ protection: None   Other Topics  Concern  . Not on file   Social History Narrative  . No narrative on file     Family History  Problem Relation Age of Onset  . Heart attack Brother        67  . Heart disease Brother        before age 31  . Hypertension Brother   . Heart attack Brother        72  . Heart attack Brother        37  . Diabetes Mother   . Hypertension Mother   . Heart disease Mother   . Heart disease Father   . Hypertension Father   . Other Father        amputation  . Hypertension Sister   . Heart disease Sister   . Vision loss Maternal Uncle     Current Outpatient Prescriptions  Medication Sig Dispense Refill  . acetaminophen (TYLENOL) 500 MG tablet Take 500 mg by mouth every 6 (six) hours as needed for mild pain. Do not exceed 4 gms of tylenol in  24 hours    . aspirin EC 81 MG tablet Take 81 mg by mouth daily.    [name] atorvastatin (LIPITOR) 40 MG tablet Take 1 tablet (40 mg total) by mouth daily at 6 PM. 30 tablet 0  . B Complex-C-Folic Acid (DIALYVITE PO) Take 1 tablet by mouth every Monday, Wednesday, and Friday.    . bisacodyl (DULCOLAX) 5 MG EC tablet Take 2 tablets (10 mg total) by mouth daily as needed for moderate constipation. 30 tablet 0  . carvedilol (COREG) 12.5 MG tablet Take 1 tablet (12.5 mg total) by mouth See admin instructions. Take 1 tablet (12.5 mg) by mouth twice daily on Sunday, Tuesday, Thursday, Saturday (hold on dialysis days)    . cinacalcet (SENSIPAR) 30 MG tablet Take 30 mg by mouth every Monday, Wednesday, and Friday.     . clopidogrel (PLAVIX) 75 MG tablet Take 75 mg by mouth daily.    . diphenhydrAMINE (BENADRYL) 25 mg capsule Take 25 mg by mouth every 6 (six) hours as needed.    [name] EPINEPHrine (EPIPEN 2-PAK) 0.3 mg/0.3 mL IJ SOAJ injection Inject 0.3 mg into the muscle once as needed (anaphylaxis).    . finasteride (PROSCAR) 5 MG tablet Take 1 tablet (5 mg total) by mouth daily. 30 tablet 0  . HYDROcodone-acetaminophen (NORCO/VICODIN) 5-325 MG tablet Take 1 tablet by mouth every 4 (four) hours as needed for moderate pain. 10 tablet 0  . LANTUS SOLOSTAR 100 UNIT/ML Solostar Pen Inject 15 Units into the skin at bedtime. 15 mL 0  . levothyroxine (SYNTHROID, LEVOTHROID) 75 MCG tablet Take 75 mcg by mouth daily before breakfast.    . Multiple Vitamins-Minerals (MULTIVITAMIN WITH MINERALS) tablet Take 1 tablet by mouth daily.    [name] omeprazole (PRILOSEC) 40 MG capsule Take 40 mg by mouth daily.    . polyethylene glycol powder (GLYCOLAX/MIRALAX) powder Take 17 g by mouth daily. (Patient taking differently: Take 17 g by mouth daily. Mix in 8 oz liquid and drink) 255 g 0  . potassium chloride SA (K-DUR,KLOR-CON) 20 MEQ tablet Take 20 mEq by mouth daily.    [name] senna (SENOKOT) 8.6 MG TABS tablet Take 2 tablets (17.2 mg total)  by mouth at bedtime. 120 each 0  . sevelamer carbonate (RENVELA) 800 MG tablet Take 2,400 mg by mouth 3 (three) times daily with meals.      No current facility-administered medications for this visit.  Allergies  Allergen Reactions  . Bee Venom Anaphylaxis  . Iodine Swelling  . Penicillins Swelling and Other (See Comments)    SWELLING REACTION UNSPECIFIED   Has patient had a PCN reaction causing immediate rash, facial/tongue/throat swelling, SOB or lightheadedness with hypotension: No Has patient had a PCN reaction causing severe rash involving mucus membranes or skin necrosis: No Has patient had a PCN reaction that required hospitalization No Has patient had a PCN reaction occurring within the last 10 years: No If all of the above answers are "NO", then may proceed with Cephalosporin use.  . Sulfadiazine Other (See Comments)    1% Silver Sulfadiazine cream causes burning over a large area of skin.  [name] Plavix [Clopidogrel] Other (See Comments)    Caused Nose bleed    REVIEW OF SYSTEMS (negative unless checked):   Cardiac:  []  Chest pain or chest pressure? []  Shortness of breath upon activity? []  Shortness of breath when lying flat? []  Irregular heart rhythm?  Vascular:  []  Pain in calf, thigh, or hip brought on by walking? []  Pain in feet at night that wakes you up from your sleep? []  Blood clot in your veins? []  Leg swelling?  Pulmonary:  []  Oxygen at home? []  Productive cough? []  Wheezing?  Neurologic:  []  Sudden weakness in arms or legs? []  Sudden numbness in arms or legs? []  Sudden onset of difficult speaking or slurred speech? []  Temporary loss of vision in one eye? []  Problems with dizziness?  Gastrointestinal:  []  Blood in stool? []  Vomited blood?  Genitourinary:  []  Burning when urinating? []  Blood in urine? [x]  end stage renal disease-HD: M/W/F   Psychiatric:  []  Major depression  Hematologic:  []  Bleeding problems? []  Problems with blood  clotting?  Dermatologic:  []  Rashes or ulcers?  Constitutional:  []  Fever or chills?  Ear/Nose/Throat:  []  Change in hearing? []  Nose bleeds? []  Sore throat?  Musculoskeletal:  []  Back pain? []  Joint pain? []  Muscle pain?   Physical Examination   Vitals:   04/23/17 1403  BP: (!) 168/84  Pulse: 70  Resp: 20  Temp: (!) 97 F (36.1 C)  SpO2: 100%  Height: 6\' 2"  (1.88 m)   There is no height or weight on file to calculate BMI.  General Alert, O x 3, WD, NAD  Pulmonary Sym exp, good B air movt, CTA B  Cardiac RRR, Nl S1, S2, no Murmurs, No rubs, No S3,S4  Vascular Vessel Right Left  Radial Palpable Palpable  Brachial Palpable Palpable  Ulnar Not palpable Not palpable    Musculo- skeletal M/S 5/5 throughout  , Extremities without ischemic changes except R BKA, pulsatile R BC AVF, high pitch distal thrill  Neurologic Pain and light touch intact in extremities , Motor exam as listed above    Medical Decision Making   JSON KOELZER is a 66 y.o. male who presents with ESRD requiring hemodialysis, imminent occlusion of R BC AVF   I think it is reasonable to try to salvage the R BC AVF with R arm fistulogram and possible intervention.  I offered 27 SEP 18 but the patient has a scheduling conflict so he is schedule with Dr. Trula Slade on 25 SEP 18.  I discussed with the patient the nature of angiographic procedures, especially the limited patencies of any endovascular intervention.    The patient is aware of that the risks of an angiographic procedure include but are not limited to: bleeding, infection, access site complications, renal failure,  embolization, rupture of vessel, dissection, arteriovenous fistula, possible need for emergent surgical intervention, possible need for surgical procedures to treat the patient's pathology, anaphylactic reaction to contrast, and stroke and death.    The patient is aware of the risks and agrees to proceed.   [name], MD,  FACS [facility] Office: [phone] Pager: [phone]

## 2017-04-24 DIAGNOSIS — N2581 Secondary hyperparathyroidism of renal origin: Secondary | ICD-10-CM | POA: Diagnosis not present

## 2017-04-24 DIAGNOSIS — N186 End stage renal disease: Secondary | ICD-10-CM | POA: Diagnosis not present

## 2017-04-24 DIAGNOSIS — Z992 Dependence on renal dialysis: Secondary | ICD-10-CM | POA: Diagnosis not present

## 2017-04-24 DIAGNOSIS — Z23 Encounter for immunization: Secondary | ICD-10-CM | POA: Diagnosis not present

## 2017-04-24 DIAGNOSIS — D509 Iron deficiency anemia, unspecified: Secondary | ICD-10-CM | POA: Diagnosis not present

## 2017-04-24 DIAGNOSIS — D631 Anemia in chronic kidney disease: Secondary | ICD-10-CM | POA: Diagnosis not present

## 2017-04-26 DIAGNOSIS — N2581 Secondary hyperparathyroidism of renal origin: Secondary | ICD-10-CM | POA: Diagnosis not present

## 2017-04-26 DIAGNOSIS — Z992 Dependence on renal dialysis: Secondary | ICD-10-CM | POA: Diagnosis not present

## 2017-04-26 DIAGNOSIS — D631 Anemia in chronic kidney disease: Secondary | ICD-10-CM | POA: Diagnosis not present

## 2017-04-26 DIAGNOSIS — N186 End stage renal disease: Secondary | ICD-10-CM | POA: Diagnosis not present

## 2017-04-26 DIAGNOSIS — D509 Iron deficiency anemia, unspecified: Secondary | ICD-10-CM | POA: Diagnosis not present

## 2017-04-26 DIAGNOSIS — Z23 Encounter for immunization: Secondary | ICD-10-CM | POA: Diagnosis not present

## 2017-04-27 ENCOUNTER — Encounter (HOSPITAL_COMMUNITY): Payer: Self-pay | Admitting: Surgery

## 2017-04-27 ENCOUNTER — Other Ambulatory Visit: Payer: Self-pay | Admitting: Licensed Clinical Social Worker

## 2017-04-27 ENCOUNTER — Ambulatory Visit (HOSPITAL_COMMUNITY)
Admission: RE | Admit: 2017-04-27 | Discharge: 2017-04-27 | Disposition: A | Discharge status: Home / Self Care | Payer: Medicare Other | Source: Ambulatory Visit | Attending: Surgery | Admitting: Surgery

## 2017-04-27 ENCOUNTER — Encounter (HOSPITAL_COMMUNITY)
Admission: RE | Disposition: A | Discharge status: Home / Self Care | Payer: Self-pay | Source: Ambulatory Visit | Attending: Surgery

## 2017-04-27 ENCOUNTER — Encounter: Payer: Self-pay | Admitting: Vascular Surgery

## 2017-04-27 ENCOUNTER — Telehealth: Payer: Self-pay | Admitting: Surgery

## 2017-04-27 DIAGNOSIS — E1122 Type 2 diabetes mellitus with diabetic chronic kidney disease: Secondary | ICD-10-CM | POA: Insufficient documentation

## 2017-04-27 DIAGNOSIS — Z8673 Personal history of transient ischemic attack (TIA), and cerebral infarction without residual deficits: Secondary | ICD-10-CM | POA: Insufficient documentation

## 2017-04-27 DIAGNOSIS — Z882 Allergy status to sulfonamides status: Secondary | ICD-10-CM | POA: Insufficient documentation

## 2017-04-27 DIAGNOSIS — Z7982 Long term (current) use of aspirin: Secondary | ICD-10-CM | POA: Insufficient documentation

## 2017-04-27 DIAGNOSIS — Z89511 Acquired absence of right leg below knee: Secondary | ICD-10-CM | POA: Insufficient documentation

## 2017-04-27 DIAGNOSIS — Z883 Allergy status to other anti-infective agents status: Secondary | ICD-10-CM | POA: Insufficient documentation

## 2017-04-27 DIAGNOSIS — Z880 Allergy status to penicillin: Secondary | ICD-10-CM | POA: Insufficient documentation

## 2017-04-27 DIAGNOSIS — T82858A Stenosis of vascular prosthetic devices, implants and grafts, initial encounter: Secondary | ICD-10-CM | POA: Diagnosis not present

## 2017-04-27 DIAGNOSIS — I120 Hypertensive chronic kidney disease with stage 5 chronic kidney disease or end stage renal disease: Secondary | ICD-10-CM | POA: Insufficient documentation

## 2017-04-27 DIAGNOSIS — H409 Unspecified glaucoma: Secondary | ICD-10-CM | POA: Insufficient documentation

## 2017-04-27 DIAGNOSIS — N185 Chronic kidney disease, stage 5: Secondary | ICD-10-CM | POA: Diagnosis not present

## 2017-04-27 DIAGNOSIS — G4700 Insomnia, unspecified: Secondary | ICD-10-CM | POA: Insufficient documentation

## 2017-04-27 DIAGNOSIS — N186 End stage renal disease: Secondary | ICD-10-CM | POA: Insufficient documentation

## 2017-04-27 DIAGNOSIS — I428 Other cardiomyopathies: Secondary | ICD-10-CM | POA: Insufficient documentation

## 2017-04-27 DIAGNOSIS — Z992 Dependence on renal dialysis: Secondary | ICD-10-CM | POA: Diagnosis not present

## 2017-04-27 DIAGNOSIS — D631 Anemia in chronic kidney disease: Secondary | ICD-10-CM | POA: Insufficient documentation

## 2017-04-27 DIAGNOSIS — Z8249 Family history of ischemic heart disease and other diseases of the circulatory system: Secondary | ICD-10-CM | POA: Insufficient documentation

## 2017-04-27 DIAGNOSIS — Y832 Surgical operation with anastomosis, bypass or graft as the cause of abnormal reaction of the patient, or of later complication, without mention of misadventure at the time of the procedure: Secondary | ICD-10-CM | POA: Insufficient documentation

## 2017-04-27 DIAGNOSIS — F419 Anxiety disorder, unspecified: Secondary | ICD-10-CM | POA: Insufficient documentation

## 2017-04-27 DIAGNOSIS — Z7902 Long term (current) use of antithrombotics/antiplatelets: Secondary | ICD-10-CM | POA: Insufficient documentation

## 2017-04-27 DIAGNOSIS — F329 Major depressive disorder, single episode, unspecified: Secondary | ICD-10-CM | POA: Insufficient documentation

## 2017-04-27 DIAGNOSIS — K219 Gastro-esophageal reflux disease without esophagitis: Secondary | ICD-10-CM | POA: Insufficient documentation

## 2017-04-27 DIAGNOSIS — Z794 Long term (current) use of insulin: Secondary | ICD-10-CM | POA: Insufficient documentation

## 2017-04-27 HISTORY — PX: A/V FISTULAGRAM: CATH118298

## 2017-04-27 LAB — GLUCOSE, CAPILLARY
GLUCOSE-CAPILLARY: 91 mg/dL (ref 65–99)
GLUCOSE-CAPILLARY: 102 mg/dL — AB (ref 65–99)

## 2017-04-27 LAB — POCT I-STAT, CHEM 8
BUN: 40 mg/dL — ABNORMAL HIGH (ref 6–20)
CALCIUM ION: 1.13 mmol/L — AB (ref 1.15–1.40)
Chloride: 95 mmol/L — ABNORMAL LOW (ref 101–111)
Creatinine, Ser: 5.5 mg/dL — ABNORMAL HIGH (ref 0.61–1.24)
Glucose, Bld: 104 mg/dL — ABNORMAL HIGH (ref 65–99)
HEMATOCRIT: 34 % — AB (ref 39.0–52.0)
HEMOGLOBIN: 11.6 g/dL — AB (ref 13.0–17.0)
Potassium: 4.4 mmol/L (ref 3.5–5.1)
SODIUM: 137 mmol/L (ref 135–145)
TCO2: 32 mmol/L (ref 22–32)

## 2017-04-27 SURGERY — A/V FISTULAGRAM
Anesthesia: LOCAL | Laterality: Right

## 2017-04-27 MED ORDER — LIDOCAINE HCL (PF) 1 % IJ SOLN
INTRAMUSCULAR | Status: DC | PRN
  Administered 2017-04-27: 5 mL

## 2017-04-27 MED ORDER — METHYLPREDNISOLONE SODIUM SUCC 125 MG IJ SOLR
INTRAMUSCULAR | Status: AC
  Administered 2017-04-27: 125 mg via INTRAVENOUS
  Filled 2017-04-27: qty 2

## 2017-04-27 MED ORDER — SODIUM CHLORIDE 0.9% FLUSH: 3.0000 mL | Freq: Two times a day (BID) | INTRAVENOUS | Status: DC

## 2017-04-27 MED ORDER — DIPHENHYDRAMINE HCL 50 MG/ML IJ SOLN
INTRAMUSCULAR | Status: AC
  Administered 2017-04-27: 25 mg via INTRAVENOUS
  Filled 2017-04-27: qty 1

## 2017-04-27 MED ORDER — DIPHENHYDRAMINE HCL 50 MG/ML IJ SOLN
25.0000 mg | Freq: Once | INTRAMUSCULAR | Status: AC
  Administered 2017-04-27: 25 mg via INTRAVENOUS

## 2017-04-27 MED ORDER — HYDRALAZINE HCL 20 MG/ML IJ SOLN: 5.0000 mg | INTRAMUSCULAR | Status: DC | PRN

## 2017-04-27 MED ORDER — FAMOTIDINE IN NACL 20-0.9 MG/50ML-% IV SOLN
20.0000 mg | Freq: Once | INTRAVENOUS | Status: AC
  Administered 2017-04-27: 20 mg via INTRAVENOUS

## 2017-04-27 MED ORDER — SODIUM CHLORIDE 0.9 % IV SOLN: 250.0000 mL | INTRAVENOUS | Status: DC | PRN

## 2017-04-27 MED ORDER — FAMOTIDINE IN NACL 20-0.9 MG/50ML-% IV SOLN
INTRAVENOUS | Status: AC
  Administered 2017-04-27: 20 mg via INTRAVENOUS
  Filled 2017-04-27: qty 50

## 2017-04-27 MED ORDER — SODIUM CHLORIDE 0.9% FLUSH: 3.0000 mL | INTRAVENOUS | Status: DC | PRN

## 2017-04-27 MED ORDER — DIPHENHYDRAMINE HCL 50 MG/ML IJ SOLN: 50.0000 mg | Freq: Once | INTRAMUSCULAR | Status: DC

## 2017-04-27 MED ORDER — METHYLPREDNISOLONE SODIUM SUCC 125 MG IJ SOLR
125.0000 mg | Freq: Once | INTRAMUSCULAR | Status: AC
  Administered 2017-04-27: 125 mg via INTRAVENOUS

## 2017-04-27 MED ORDER — LIDOCAINE HCL 2 % IJ SOLN
INTRAMUSCULAR | Status: AC
  Filled 2017-04-27: qty 10

## 2017-04-27 MED ORDER — LABETALOL HCL 5 MG/ML IV SOLN: 10.0000 mg | INTRAVENOUS | Status: DC | PRN

## 2017-04-27 MED ORDER — HEPARIN (PORCINE) IN NACL 2-0.9 UNIT/ML-% IJ SOLN
INTRAMUSCULAR | Status: AC
  Filled 2017-04-27: qty 1000

## 2017-04-27 MED ORDER — HEPARIN (PORCINE) IN NACL 2-0.9 UNIT/ML-% IJ SOLN
INTRAMUSCULAR | Status: AC | PRN
  Administered 2017-04-27: 500 mL

## 2017-04-27 MED ORDER — IODIXANOL 320 MG/ML IV SOLN
INTRAVENOUS | Status: DC | PRN
Start: 2017-04-27 — End: 2017-04-27
  Administered 2017-04-27: 35 mL via INTRAVENOUS

## 2017-04-27 SURGICAL SUPPLY — 10 items
BAG SNAP BAND KOVER 36X36 (MISCELLANEOUS) ×2 IMPLANT
COVER DOME SNAP 22 D (MISCELLANEOUS) ×2 IMPLANT
COVER PRB 48X5XTLSCP FOLD TPE (BAG) ×1 IMPLANT
COVER PROBE 5X48 (BAG) ×2
KIT MICROINTRODUCER STIFF 5F (SHEATH) ×1 IMPLANT
PROTECTION STATION PRESSURIZED (MISCELLANEOUS) ×2
STATION PROTECTION PRESSURIZED (MISCELLANEOUS) ×1 IMPLANT
STOPCOCK MORSE 400PSI 3WAY (MISCELLANEOUS) ×2 IMPLANT
TRAY PV CATH (CUSTOM PROCEDURE TRAY) ×2 IMPLANT
TUBING CIL FLEX 10 FLL-RA (TUBING) ×2 IMPLANT

## 2017-04-27 NOTE — Interval H&P Note (Signed)
History and Physical Interval Note:  04/27/2017 7:24 AM  Jacob [name]  has presented today for surgery, with the diagnosis of ESRD  The various methods of treatment have been discussed with the patient and family. After consideration of risks, benefits and other options for treatment, the patient has consented to  Procedure(s): A/V Fistulagram (Right) as a surgical intervention .  The patient's history has been reviewed, patient examined, no change in status, stable for surgery.  I have reviewed the patient's chart and labs.  Questions were answered to the patient's satisfaction.     Annamarie Major

## 2017-04-27 NOTE — H&P (View-Only) (Signed)
Established Dialysis Access   History of Present Illness   Jacob [name] is a 66 y.o. (March 17, 1951) male who presents for re-evaluation of R BC AVF.  Patient has been have flow rate issues along with prolonged bleeding.  The patient was referred to IR for intervention but they refused on 04/20/17.  The patient current completes HD via a TDC in the [facility].  Past Medical History:  Diagnosis Date  . Anemia   . Anemia in chronic kidney disease(285.21)   . Anxiety   . Arthritis   . Depression   . Dyspnea    with exertion  . ESRD (end stage renal disease) on dialysis [facility])    "MWF; [facility] [place]" (02/18/2017)  . Essential hypertension   . GERD (gastroesophageal reflux disease)   . Glaucoma   . History of blood transfusion   . Insomnia   . Nonischemic cardiomyopathy ([place])   . Stroke [facility]) 2016   - "they said it was not a stroke"  . Type 2 diabetes mellitus ([place])    Type II    Past Surgical History:  Procedure Laterality Date  . A/V FISTULAGRAM Right 10/06/2016   Procedure: A/V Fistulagram;  Surgeon: [name], MD;  Location: [place] [facility];  Service: Cardiovascular;  Laterality: Right;  . ABDOMINAL AORTOGRAM N/A 12/30/2016   Procedure: Abdominal Aortogram;  Surgeon: Waynetta Sandy, MD;  Location: [place] [facility];  Service: Cardiovascular;  Laterality: N/A;  . ABDOMINAL AORTOGRAM N/A 04/08/2017   Procedure: ABDOMINAL AORTOGRAM;  Surgeon: Conrad Hanover, MD;  Location: [place] [facility];  Service: Cardiovascular;  Laterality: N/A;  . ABDOMINAL AORTOGRAM W/LOWER EXTREMITY N/A 02/23/2017   Procedure: Abdominal Aortogram w/Lower Extremity;  Surgeon: [name], MD;  Location: [place] [facility];  Service: Cardiovascular;  Laterality: N/A;  Rt. leg  . AMPUTATION Right 02/28/2017   Procedure: RIGHT BELOW KNEE AMPUTATION;  Surgeon: [name], MD;  Location: [place];  Service: Vascular;  Laterality: Right;  . AV FISTULA PLACEMENT     Hx: of  . AV FISTULA PLACEMENT  Right 05/04/2013   Procedure: ARTERIOVENOUS (AV) FISTULA CREATION- RIGHT ARM;  Surgeon: Conrad Standard, MD;  Location: [place];  Service: Vascular;  Laterality: Right;  Ultrasound guided  . AV FISTULA PLACEMENT Right 07/28/2016   Procedure: BRACHIOCEPHALIC ARTERIOVENOUS (AV) FISTULA CREATION;  Surgeon: [name], MD;  Location: [place];  Service: Vascular;  Laterality: Right;  . CATARACT EXTRACTION W/PHACO Left 07/16/2014   Procedure: CATARACT EXTRACTION PHACO AND INTRAOCULAR LENS PLACEMENT (IOC);  Surgeon: [name], MD;  Location: [facility];  Service: Ophthalmology;  Laterality: Left;  CDE:8.86  . CATARACT EXTRACTION W/PHACO Right 07/30/2014   Procedure: CATARACT EXTRACTION PHACO AND INTRAOCULAR LENS PLACEMENT (IOC);  Surgeon: [name], MD;  Location: [facility];  Service: Ophthalmology;  Laterality: Right;  CDE 8.99  . COLONOSCOPY     Hx: of  . FISTULA SUPERFICIALIZATION Right 07/28/2016   Procedure: FISTULA SUPERFICIALIZATION;  Surgeon: [name], MD;  Location: [place];  Service: Vascular;  Laterality: Right;  . FISTULA SUPERFICIALIZATION Right 10/13/2016   Procedure: BRACHIOCEPHALIC ARTERIOVENOUS FISTULA SUPERFICIALIZATION;  Surgeon: [name], MD;  Location: [place];  Service: Vascular;  Laterality: Right;  . FISTULOGRAM N/A 05/04/2013   Procedure: CENTRAL VENOGRAM;  Surgeon: Conrad [place], MD;  Location: [place];  Service: Vascular;  Laterality: N/A;  . INSERTION OF DIALYSIS CATHETER Right 05/04/2013   Procedure: INSERTION OF DIALYSIS CATHETER;  Surgeon: Conrad Killdeer, MD;  Location: [place];  Service: Vascular;  Laterality: Right;  Ultrasound guided  . INSERTION OF DIALYSIS CATHETER N/A 07/28/2016   Procedure: INSERTION OF DIALYSIS CATHETER;  Surgeon: [name], MD;  Location: [place];  Service: Vascular;  Laterality: N/A;  . IR FLUORO GUIDE CV LINE RIGHT  04/13/2017  . IR US GUIDE VASC ACCESS RIGHT  04/13/2017  . LIGATION OF ARTERIOVENOUS  FISTULA Left 05/04/2013    Procedure: LIGATION OF LEFT RADIAL CEPHALIC ARTERIOVENOUS  FISTULA;  Surgeon: Conrad [place], MD;  Location: [place];  Service: Vascular;  Laterality: Left;  Ultrasound guided  . LIGATION OF ARTERIOVENOUS  FISTULA Right 07/28/2016   Procedure: LIGATION OF RADIOCEPHALIC ARTERIOVENOUS  FISTULA;  Surgeon: [name], MD;  Location: [place];  Service: Vascular;  Laterality: Right;  . LOWER EXTREMITY ANGIOGRAPHY N/A 12/30/2016   Procedure: Lower Extremity Angiography;  Surgeon: Waynetta Sandy, MD;  Location: [place] [facility];  Service: Cardiovascular;  Laterality: N/A;  . LUMBAR SPINE SURGERY    . PERIPHERAL VASCULAR ATHERECTOMY Right 12/30/2016   Procedure: Peripheral Vascular Atherectomy;  Surgeon: Waynetta Sandy, MD;  Location: [place] [facility];  Service: Cardiovascular;  Laterality: Right;  AT and PT  . PERIPHERAL VASCULAR BALLOON ANGIOPLASTY Right 12/30/2016   Procedure: Peripheral Vascular Balloon Angioplasty;  Surgeon: Waynetta Sandy, MD;  Location: [place] [facility];  Service: Cardiovascular;  Laterality: Right;  PTA of PT and AT  . PERIPHERAL VASCULAR BALLOON ANGIOPLASTY  02/23/2017   Procedure: Peripheral Vascular Balloon Angioplasty;  Surgeon: [name], MD;  Location: [place] [facility];  Service: Cardiovascular;;  RT. Anterior Tib.  [name] PERIPHERAL VASCULAR BALLOON ANGIOPLASTY  04/08/2017   Procedure: PERIPHERAL VASCULAR BALLOON ANGIOPLASTY;  Surgeon: Conrad [place]own, MD;  Location: [place] [facility];  Service: Cardiovascular;;  . PERIPHERAL VASCULAR CATHETERIZATION N/A 01/24/2015   Procedure: Fistulagram;  Surgeon: Conrad Palmer, MD;  Location: [place] [facility];  Service: Cardiovascular;  Laterality: N/A;  . PERIPHERAL VASCULAR CATHETERIZATION Right 06/04/2016   Procedure: A/V Shuntogram/Fistulagram;  Surgeon: Conrad Athena, MD;  Location: [place] [facility];  Service: Cardiovascular;  Laterality: Right;  . REVISON OF ARTERIOVENOUS FISTULA Right  01/02/2014   Procedure: REVISON OF ARTERIOVENOUS FISTULA ANASTOMOSIS;  Surgeon: Conrad Powers, MD;  Location: [place];  Service: Vascular;  Laterality: Right;  . REVISON OF ARTERIOVENOUS FISTULA Right 01/02/2016   Procedure: REVISION OF RADIOCEPHALIC ARTERIOVENOUS FISTULA  with BOVINE PATCH ANGIOPLASTY RIGHT RADIAL ARTERY;  Surgeon: Mal [name], MD;  Location: [place];  Service: Vascular;  Laterality: Right;  . SHUNTOGRAM N/A 11/07/2013   Procedure: Fistulogram;  Surgeon: [name], MD;  Location: [facility] C[facility] LAB;  Service: Cardiovascular;  Laterality: N/A;  . TRANSMETATARSAL AMPUTATION Right 01/01/2017   Procedure: TRANSMETATARSAL AMPUTATION;  Surgeon: [name], MD;  [facility];  Service: Vascular;  Laterality: Right;    Social History   Social History  . Marital status: Divorced    Spouse name: N/A  . Number of children: N/A  . Years of education: N/A   Occupational History  . Not on file.   Social History Main Topics  . Smoking status: Never Smoker  . Smokeless tobacco: Never Used  . Alcohol use Yes     Comment: 1- fifth of gin a week  . Drug use: No  . Sexual activity: Yes    Birth control/ protection: None   Other Topics  Concern  . Not on file   Social History Narrative  . No narrative on file     Family History  Problem Relation Age of Onset  . Heart attack Brother        45  . Heart disease Brother        before age 4  . Hypertension Brother   . Heart attack Brother        11  . Heart attack Brother        29  . Diabetes Mother   . Hypertension Mother   . Heart disease Mother   . Heart disease Father   . Hypertension Father   . Other Father        amputation  . Hypertension Sister   . Heart disease Sister   . Vision loss Maternal Uncle     Current Outpatient Prescriptions  Medication Sig Dispense Refill  . acetaminophen (TYLENOL) 500 MG tablet Take 500 mg by mouth every 6 (six) hours as needed for mild pain. Do not exceed 4 gms of tylenol in  24 hours    . aspirin EC 81 MG tablet Take 81 mg by mouth daily.    [name] atorvastatin (LIPITOR) 40 MG tablet Take 1 tablet (40 mg total) by mouth daily at 6 PM. 30 tablet 0  . B Complex-C-Folic Acid (DIALYVITE PO) Take 1 tablet by mouth every Monday, Wednesday, and Friday.    . bisacodyl (DULCOLAX) 5 MG EC tablet Take 2 tablets (10 mg total) by mouth daily as needed for moderate constipation. 30 tablet 0  . carvedilol (COREG) 12.5 MG tablet Take 1 tablet (12.5 mg total) by mouth See admin instructions. Take 1 tablet (12.5 mg) by mouth twice daily on Sunday, Tuesday, Thursday, Saturday (hold on dialysis days)    . cinacalcet (SENSIPAR) 30 MG tablet Take 30 mg by mouth every Monday, Wednesday, and Friday.     . clopidogrel (PLAVIX) 75 MG tablet Take 75 mg by mouth daily.    . diphenhydrAMINE (BENADRYL) 25 mg capsule Take 25 mg by mouth every 6 (six) hours as needed.    [name] EPINEPHrine (EPIPEN 2-PAK) 0.3 mg/0.3 mL IJ SOAJ injection Inject 0.3 mg into the muscle once as needed (anaphylaxis).    . finasteride (PROSCAR) 5 MG tablet Take 1 tablet (5 mg total) by mouth daily. 30 tablet 0  . HYDROcodone-acetaminophen (NORCO/VICODIN) 5-325 MG tablet Take 1 tablet by mouth every 4 (four) hours as needed for moderate pain. 10 tablet 0  . LANTUS SOLOSTAR 100 UNIT/ML Solostar Pen Inject 15 Units into the skin at bedtime. 15 mL 0  . levothyroxine (SYNTHROID, LEVOTHROID) 75 MCG tablet Take 75 mcg by mouth daily before breakfast.    . Multiple Vitamins-Minerals (MULTIVITAMIN WITH MINERALS) tablet Take 1 tablet by mouth daily.    [name] omeprazole (PRILOSEC) 40 MG capsule Take 40 mg by mouth daily.    . polyethylene glycol powder (GLYCOLAX/MIRALAX) powder Take 17 g by mouth daily. (Patient taking differently: Take 17 g by mouth daily. Mix in 8 oz liquid and drink) 255 g 0  . potassium chloride SA (K-DUR,KLOR-CON) 20 MEQ tablet Take 20 mEq by mouth daily.    [name] senna (SENOKOT) 8.6 MG TABS tablet Take 2 tablets (17.2 mg total)  by mouth at bedtime. 120 each 0  . sevelamer carbonate (RENVELA) 800 MG tablet Take 2,400 mg by mouth 3 (three) times daily with meals.      No current facility-administered medications for this visit.  Allergies  Allergen Reactions  . Bee Venom Anaphylaxis  . Iodine Swelling  . Penicillins Swelling and Other (See Comments)    SWELLING REACTION UNSPECIFIED   Has patient had a PCN reaction causing immediate rash, facial/tongue/throat swelling, SOB or lightheadedness with hypotension: No Has patient had a PCN reaction causing severe rash involving mucus membranes or skin necrosis: No Has patient had a PCN reaction that required hospitalization No Has patient had a PCN reaction occurring within the last 10 years: No If all of the above answers are "NO", then may proceed with Cephalosporin use.  . Sulfadiazine Other (See Comments)    1% Silver Sulfadiazine cream causes burning over a large area of skin.  [name] Plavix [Clopidogrel] Other (See Comments)    Caused Nose bleed    REVIEW OF SYSTEMS (negative unless checked):   Cardiac:  []  Chest pain or chest pressure? []  Shortness of breath upon activity? []  Shortness of breath when lying flat? []  Irregular heart rhythm?  Vascular:  []  Pain in calf, thigh, or hip brought on by walking? []  Pain in feet at night that wakes you up from your sleep? []  Blood clot in your veins? []  Leg swelling?  Pulmonary:  []  Oxygen at home? []  Productive cough? []  Wheezing?  Neurologic:  []  Sudden weakness in arms or legs? []  Sudden numbness in arms or legs? []  Sudden onset of difficult speaking or slurred speech? []  Temporary loss of vision in one eye? []  Problems with dizziness?  Gastrointestinal:  []  Blood in stool? []  Vomited blood?  Genitourinary:  []  Burning when urinating? []  Blood in urine? [x]  end stage renal disease-HD: M/W/F   Psychiatric:  []  Major depression  Hematologic:  []  Bleeding problems? []  Problems with blood  clotting?  Dermatologic:  []  Rashes or ulcers?  Constitutional:  []  Fever or chills?  Ear/Nose/Throat:  []  Change in hearing? []  Nose bleeds? []  Sore throat?  Musculoskeletal:  []  Back pain? []  Joint pain? []  Muscle pain?   Physical Examination   Vitals:   04/23/17 1403  BP: (!) 168/84  Pulse: 70  Resp: 20  Temp: (!) 97 F (36.1 C)  SpO2: 100%  Height: 6\' 2"  (1.88 m)   There is no height or weight on file to calculate BMI.  General Alert, O x 3, WD, NAD  Pulmonary Sym exp, good B air movt, CTA B  Cardiac RRR, Nl S1, S2, no Murmurs, No rubs, No S3,S4  Vascular Vessel Right Left  Radial Palpable Palpable  Brachial Palpable Palpable  Ulnar Not palpable Not palpable    Musculo- skeletal M/S 5/5 throughout  , Extremities without ischemic changes except R BKA, pulsatile R BC AVF, high pitch distal thrill  Neurologic Pain and light touch intact in extremities , Motor exam as listed above    Medical Decision Making   ALADDIN KOLLMANN is a 66 y.o. male who presents with ESRD requiring hemodialysis, imminent occlusion of R BC AVF   I think it is reasonable to try to salvage the R BC AVF with R arm fistulogram and possible intervention.  I offered 27 SEP 18 but the patient has a scheduling conflict so he is schedule with Dr. Trula Slade on 25 SEP 18.  I discussed with the patient the nature of angiographic procedures, especially the limited patencies of any endovascular intervention.    The patient is aware of that the risks of an angiographic procedure include but are not limited to: bleeding, infection, access site complications, renal failure,  embolization, rupture of vessel, dissection, arteriovenous fistula, possible need for emergent surgical intervention, possible need for surgical procedures to treat the patient's pathology, anaphylactic reaction to contrast, and stroke and death.    The patient is aware of the risks and agrees to proceed.   [name], MD,  FACS [facility] [place] [facility] Office: [phone] Pager: [phone]

## 2017-04-27 NOTE — Patient Outreach (Signed)
Assessment:  CSW spoke via phone with client. CSW verified client identity. CSW received verbal permission from client on 04/27/17 for CSW to speak witih client about current client needs and status. Client has been residing at [facility] facility.  He has been receiving nursing care and physical therapy support at that facility. Client has said that physical therapy sessions he has received at nursing facility have been helpful to him. Client has support from his sister. Client receives dialysis treatments 3 times weekly as scheduled. Client did not mention any pain issues. Client uses wheelchair to assist him with ambulation.  CSW and client spoke of client care plan. CSW encouraged client to participate in all scheduled client physical therapy sessions for client in next 30 days at nursing center. Client said he had undergone a procedure at [facility] earlier today and had now returned to nursing center for care.  Client said he was able to attend dialysis appointments as scheduled. He said he was eating adequately and sleeping adequately. He said that his sister visited him regularly at nursing center. CSW thanked client for phone call with CSW on 04/27/17. CSW encouraged client to call CSW at [phone] as needed to discuss social work needs of client.    Plan:  Client to participate in all scheduled client physical therapy sessions for client in next 30 days at nursing center.   CSW to call client in 3 weeks to assess client needs at that time.  Norva Riffle.Ledon Weihe MSW, LCSW Licensed Clinical Social Worker [facility] Care Management [phone]

## 2017-04-27 NOTE — Op Note (Signed)
    Patient name: Jacob [name] MRN: [id number] DOB: February 16, 1951 Sex: male  04/27/2017 Pre-operative Diagnosis: ESRD Post-operative diagnosis:  Same Surgeon:  Annamarie Major Procedure Performed:  1.  U/S guided access, right cephalic vein  2.  Fistulogram    Indications:  Patient scheduled for access evaluation  Procedure:  The patient was identified in the holding area and taken to room 8.  The patient was then placed supine on the table and prepped and draped in the usual sterile fashion.  A time out was called.  Ultrasound was used to evaluate the fistula.  The vein was patent and compressible.  A digital ultrasound image was acquired.  The fistula was then accessed under ultrasound guidance using a micropuncture needle.  An 018 wire was then asvanced without resistance and a micropuncture sheath was placed.  Contrast injections were then performed through the sheath.  Findings:  The fistula is occluded just beyond a large aneurysm.  It is remaining patent vial retrograde filling of the brachial vein.  The SVC is occluded.  There is filling of the left innominate vein from the right   Intervention:  none  Impression:  #1  Central vein occlusion  #2  Occluded right cephalic vein fistula  #3  Consider using existing catheter to place HeRO from the right arm and then moving the [facility] to the groin.   Theotis Burrow, M.D. [facility] [place] Office: [phone] Pager:  [phone]

## 2017-04-27 NOTE — Discharge Instructions (Signed)

## 2017-04-27 NOTE — Telephone Encounter (Signed)
-----   Message from Mena Goes, RN sent at 04/27/2017  9:37 AM EDT ----- Regarding: ASAP with any MD, appt to schedule HeRO per [facility]    ----- Message ----- From: [name], MD Sent: 04/27/2017   9:35 AM To: [facility] Charge Pool  04-27-2017:  Surgeon:  Annamarie Major Procedure Performed:  1.  U/S guided access, right cephalic vein  2.  Fistulogram  Needs to come be evaluated for HeRO by soonest available MD

## 2017-04-27 NOTE — Telephone Encounter (Signed)
Sched appt 04/28/17 at 11:45. Lm on hm#.

## 2017-04-28 ENCOUNTER — Ambulatory Visit: Payer: Medicare Other | Admitting: Vascular Surgery

## 2017-04-28 DIAGNOSIS — Z23 Encounter for immunization: Secondary | ICD-10-CM | POA: Diagnosis not present

## 2017-04-28 DIAGNOSIS — D631 Anemia in chronic kidney disease: Secondary | ICD-10-CM | POA: Diagnosis not present

## 2017-04-28 DIAGNOSIS — N2581 Secondary hyperparathyroidism of renal origin: Secondary | ICD-10-CM | POA: Diagnosis not present

## 2017-04-28 DIAGNOSIS — N186 End stage renal disease: Secondary | ICD-10-CM | POA: Diagnosis not present

## 2017-04-28 DIAGNOSIS — Z992 Dependence on renal dialysis: Secondary | ICD-10-CM | POA: Diagnosis not present

## 2017-04-28 DIAGNOSIS — D509 Iron deficiency anemia, unspecified: Secondary | ICD-10-CM | POA: Diagnosis not present

## 2017-04-28 MED FILL — Lidocaine HCl Local Inj 2%: INTRAMUSCULAR | Qty: 10 | Status: AC

## 2017-04-30 DIAGNOSIS — N2581 Secondary hyperparathyroidism of renal origin: Secondary | ICD-10-CM | POA: Diagnosis not present

## 2017-04-30 DIAGNOSIS — N186 End stage renal disease: Secondary | ICD-10-CM | POA: Diagnosis not present

## 2017-04-30 DIAGNOSIS — D631 Anemia in chronic kidney disease: Secondary | ICD-10-CM | POA: Diagnosis not present

## 2017-04-30 DIAGNOSIS — Z992 Dependence on renal dialysis: Secondary | ICD-10-CM | POA: Diagnosis not present

## 2017-04-30 DIAGNOSIS — D509 Iron deficiency anemia, unspecified: Secondary | ICD-10-CM | POA: Diagnosis not present

## 2017-04-30 DIAGNOSIS — Z23 Encounter for immunization: Secondary | ICD-10-CM | POA: Diagnosis not present

## 2017-05-02 DIAGNOSIS — Z992 Dependence on renal dialysis: Secondary | ICD-10-CM | POA: Diagnosis not present

## 2017-05-02 DIAGNOSIS — N186 End stage renal disease: Secondary | ICD-10-CM | POA: Diagnosis not present

## 2017-05-03 DIAGNOSIS — D509 Iron deficiency anemia, unspecified: Secondary | ICD-10-CM | POA: Diagnosis not present

## 2017-05-03 DIAGNOSIS — D631 Anemia in chronic kidney disease: Secondary | ICD-10-CM | POA: Diagnosis not present

## 2017-05-03 DIAGNOSIS — Z23 Encounter for immunization: Secondary | ICD-10-CM | POA: Diagnosis not present

## 2017-05-03 DIAGNOSIS — Z992 Dependence on renal dialysis: Secondary | ICD-10-CM | POA: Diagnosis not present

## 2017-05-03 DIAGNOSIS — N186 End stage renal disease: Secondary | ICD-10-CM | POA: Diagnosis not present

## 2017-05-03 DIAGNOSIS — N2581 Secondary hyperparathyroidism of renal origin: Secondary | ICD-10-CM | POA: Diagnosis not present

## 2017-05-05 ENCOUNTER — Encounter: Payer: Self-pay | Admitting: Vascular Surgery

## 2017-05-05 DIAGNOSIS — Z992 Dependence on renal dialysis: Secondary | ICD-10-CM | POA: Diagnosis not present

## 2017-05-05 DIAGNOSIS — Z23 Encounter for immunization: Secondary | ICD-10-CM | POA: Diagnosis not present

## 2017-05-05 DIAGNOSIS — D509 Iron deficiency anemia, unspecified: Secondary | ICD-10-CM | POA: Diagnosis not present

## 2017-05-05 DIAGNOSIS — N186 End stage renal disease: Secondary | ICD-10-CM | POA: Diagnosis not present

## 2017-05-05 DIAGNOSIS — N2581 Secondary hyperparathyroidism of renal origin: Secondary | ICD-10-CM | POA: Diagnosis not present

## 2017-05-05 DIAGNOSIS — D631 Anemia in chronic kidney disease: Secondary | ICD-10-CM | POA: Diagnosis not present

## 2017-05-07 ENCOUNTER — Encounter: Payer: Self-pay | Admitting: Vascular Surgery

## 2017-05-07 ENCOUNTER — Encounter: Payer: Self-pay | Admitting: *Deleted

## 2017-05-07 ENCOUNTER — Ambulatory Visit (INDEPENDENT_AMBULATORY_CARE_PROVIDER_SITE_OTHER): Payer: Medicare Other | Admitting: Vascular Surgery

## 2017-05-07 VITALS — BP 184/72 | HR 54 | Temp 97.3°F | Resp 18 | Ht 74.0 in | Wt 273.0 lb

## 2017-05-07 DIAGNOSIS — Z992 Dependence on renal dialysis: Secondary | ICD-10-CM

## 2017-05-07 DIAGNOSIS — N186 End stage renal disease: Secondary | ICD-10-CM

## 2017-05-07 NOTE — Progress Notes (Signed)
Patient ID: Jacob [name], male   DOB: [birth year], 66 y.o.   MRN: [id number]  Reason for Consult: Chronic Kidney Disease   Referred by Sharilyn Sites, MD  Subjective:     HPI:  Jacob [name] is a 66 y.o. male with end-stage renal disease on dialysis via right IJ tunneled catheter placed by interventional radiology. He has multiple bilateral upper extremity access procedures and was most recently using a right upper arm AV fistula that is now occluded in the proximal upper arm. He also has an SVC occlusion by recent fistulogram. 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 He does not have dialysis access in his bilateral lower extremities but does have significant peripheral arterial disease and most recently underwent right below-knee amputation for which he is still in rehabilitation. He does not take blood thinners. He is here today for evaluation of further access.  Past Medical History:  Diagnosis Date  . Anemia   . Anemia in chronic kidney disease(285.21)   . Anxiety   . Arthritis   . Depression   . Dyspnea    with exertion  . ESRD (end stage renal disease) on dialysis [facility])    "MWF; [facility] [place]" (02/18/2017)  . Essential hypertension   . GERD (gastroesophageal reflux disease)   . Glaucoma   . History of blood transfusion   . Insomnia   . Nonischemic cardiomyopathy ([place])   . Stroke [facility]) 2016   - "they said it was not a stroke"  . Type 2 diabetes mellitus (HCC)    Type II   Family History  Problem Relation Age of Onset  . Heart attack Brother        30  . Heart disease Brother        before age 65  . Hypertension Brother   . Heart attack Brother        34  . Heart attack Brother        42  . Diabetes Mother   . Hypertension Mother   . Heart disease Mother   . Heart disease Father   . Hypertension Father   . Other Father        amputation  . Hypertension Sister   . Heart disease Sister   . Vision loss Maternal  Uncle    Past Surgical History:  Procedure Laterality Date  . A/V FISTULAGRAM Right 10/06/2016   Procedure: A/V Fistulagram;  Surgeon: [name], MD;  Location: [place] CV LAB;  Service: Cardiovascular;  Laterality: Right;  . A/V FISTULAGRAM Right 04/27/2017   Procedure: A/V Fistulagram;  Surgeon: [name], MD;  Location: [place] CV LAB;  Service: Cardiovascular;  Laterality: Right;  . ABDOMINAL AORTOGRAM N/A 12/30/2016   Procedure: Abdominal Aortogram;  Surgeon: Waynetta Sandy, MD;  Location: [place] CV LAB;  Service: Cardiovascular;  Laterality: N/A;  . ABDOMINAL AORTOGRAM N/A 04/08/2017   Procedure: ABDOMINAL AORTOGRAM;  Surgeon: Conrad [place], MD;  Location: [place] CV LAB;  Service: Cardiovascular;  Laterality: N/A;  . ABDOMINAL AORTOGRAM W/LOWER EXTREMITY N/A 02/23/2017   Procedure: Abdominal Aortogram w/Lower Extremity;  Surgeon: [name], MD;  Location: [place] CV LAB;  Service: Cardiovascular;  Laterality: N/A;  Rt. leg  . AMPUTATION Right 02/28/2017   Procedure: RIGHT BELOW KNEE AMPUTATION;  Surgeon: [name], MD;  Location: [facility] OR;  Service: Vascular;  Laterality: Right;  . AV FISTULA PLACEMENT  Hx: of  . AV FISTULA PLACEMENT Right 05/04/2013   Procedure: ARTERIOVENOUS (AV) FISTULA CREATION- RIGHT ARM;  Surgeon: Conrad Barada, MD;  Location: [place];  Service: Vascular;  Laterality: Right;  Ultrasound guided  . AV FISTULA PLACEMENT Right 07/28/2016   Procedure: BRACHIOCEPHALIC ARTERIOVENOUS (AV) FISTULA CREATION;  Surgeon: [name], MD;  Location: [place];  Service: Vascular;  Laterality: Right;  . CATARACT EXTRACTION W/PHACO Left 07/16/2014   Procedure: CATARACT EXTRACTION PHACO AND INTRAOCULAR LENS PLACEMENT (IOC);  Surgeon: [name], MD;  Location: [facility];  Service: Ophthalmology;  Laterality: Left;  CDE:8.86  . CATARACT EXTRACTION W/PHACO Right 07/30/2014   Procedure: CATARACT EXTRACTION PHACO AND INTRAOCULAR LENS  PLACEMENT (IOC);  Surgeon: [name], MD;  Location: [facility];  Service: Ophthalmology;  Laterality: Right;  CDE 8.99  . COLONOSCOPY     Hx: of  . FISTULA SUPERFICIALIZATION Right 07/28/2016   Procedure: FISTULA SUPERFICIALIZATION;  Surgeon: [name], MD;  Location: [place];  Service: Vascular;  Laterality: Right;  . FISTULA SUPERFICIALIZATION Right 10/13/2016   Procedure: BRACHIOCEPHALIC ARTERIOVENOUS FISTULA SUPERFICIALIZATION;  Surgeon: [name], MD;  Location: [place];  Service: Vascular;  Laterality: Right;  . FISTULOGRAM N/A 05/04/2013   Procedure: CENTRAL VENOGRAM;  Surgeon: Conrad [place], MD;  Location: [place];  Service: Vascular;  Laterality: N/A;  . INSERTION OF DIALYSIS CATHETER Right 05/04/2013   Procedure: INSERTION OF DIALYSIS CATHETER;  Surgeon: Conrad [place], MD;  Location: [place];  Service: Vascular;  Laterality: Right;  Ultrasound guided  . INSERTION OF DIALYSIS CATHETER N/A 07/28/2016   Procedure: INSERTION OF DIALYSIS CATHETER;  Surgeon: [name], MD;  Location: [place];  Service: Vascular;  Laterality: N/A;  . IR FLUORO GUIDE CV LINE RIGHT  04/13/2017  . IR US GUIDE VASC ACCESS RIGHT  04/13/2017  . LIGATION OF ARTERIOVENOUS  FISTULA Left 05/04/2013   Procedure: LIGATION OF LEFT RADIAL CEPHALIC ARTERIOVENOUS  FISTULA;  Surgeon: Conrad [place], MD;  Location: [place];  Service: Vascular;  Laterality: Left;  Ultrasound guided  . LIGATION OF ARTERIOVENOUS  FISTULA Right 07/28/2016   Procedure: LIGATION OF RADIOCEPHALIC ARTERIOVENOUS  FISTULA;  Surgeon: [name], MD;  Location: [place];  Service: Vascular;  Laterality: Right;  . LOWER EXTREMITY ANGIOGRAPHY N/A 12/30/2016   Procedure: Lower Extremity Angiography;  Surgeon: Waynetta Sandy, MD;  Location: [employer] CV LAB;  Service: Cardiovascular;  Laterality: N/A;  . LUMBAR SPINE SURGERY    . PERIPHERAL VASCULAR ATHERECTOMY Right 12/30/2016   Procedure: Peripheral Vascular Atherectomy;   Surgeon: Waynetta Sandy, MD;  Location: [place] CV LAB;  Service: Cardiovascular;  Laterality: Right;  AT and PT  . PERIPHERAL VASCULAR BALLOON ANGIOPLASTY Right 12/30/2016   Procedure: Peripheral Vascular Balloon Angioplasty;  Surgeon: Waynetta Sandy, MD;  Location: [place] CV LAB;  Service: Cardiovascular;  Laterality: Right;  PTA of PT and AT  . PERIPHERAL VASCULAR BALLOON ANGIOPLASTY  02/23/2017   Procedure: Peripheral Vascular Balloon Angioplasty;  Surgeon: [name], MD;  Location: [place] CV LAB;  Service: Cardiovascular;;  RT. Anterior Tib.  [name] PERIPHERAL VASCULAR BALLOON ANGIOPLASTY  04/08/2017   Procedure: PERIPHERAL VASCULAR BALLOON ANGIOPLASTY;  Surgeon: Conrad [place], MD;  Location: [place] CV LAB;  Service: Cardiovascular;;  . PERIPHERAL VASCULAR CATHETERIZATION N/A 01/24/2015   Procedure: Fistulagram;  Surgeon: Conrad Pisgah, MD;  Location: [place] CV LAB;  Service: Cardiovascular;  Laterality: N/A;  . PERIPHERAL VASCULAR CATHETERIZATION  Right 06/04/2016   Procedure: A/V Shuntogram/Fistulagram;  Surgeon: Conrad McKinley, MD;  Location: [place] CV LAB;  Service: Cardiovascular;  Laterality: Right;  . REVISON OF ARTERIOVENOUS FISTULA Right 01/02/2014   Procedure: REVISON OF ARTERIOVENOUS FISTULA ANASTOMOSIS;  Surgeon: Conrad Gray, MD;  Location: [place];  Service: Vascular;  Laterality: Right;  . REVISON OF ARTERIOVENOUS FISTULA Right 01/02/2016   Procedure: REVISION OF RADIOCEPHALIC ARTERIOVENOUS FISTULA  with BOVINE PATCH ANGIOPLASTY RIGHT RADIAL ARTERY;  Surgeon: Mal [name], MD;  Location: [place];  Service: Vascular;  Laterality: Right;  . SHUNTOGRAM N/A 11/07/2013   Procedure: Fistulogram;  Surgeon: [name], MD;  Location: [facility] CATH LAB;  Service: Cardiovascular;  Laterality: N/A;  . TRANSMETATARSAL AMPUTATION Right 01/01/2017   Procedure: TRANSMETATARSAL AMPUTATION;  Surgeon: [name], MD;  Location: [facility] OR;  Service: Vascular;   Laterality: Right;    Short Social History:  Social History  Substance Use Topics  . Smoking status: Never Smoker  . Smokeless tobacco: Never Used  . Alcohol use Yes     Comment: 1- fifth of gin a week    Allergies  Allergen Reactions  . Bee Venom Anaphylaxis  . Iodine Swelling  . Penicillins Swelling and Other (See Comments)    SWELLING REACTION UNSPECIFIED   Has patient had a PCN reaction causing immediate rash, facial/tongue/throat swelling, SOB or lightheadedness with hypotension: No Has patient had a PCN reaction causing severe rash involving mucus membranes or skin necrosis: No Has patient had a PCN reaction that required hospitalization No Has patient had a PCN reaction occurring within the last 10 years: No If all of the above answers are "NO", then may proceed with Cephalosporin use.  . Sulfadiazine Other (See Comments)    1% Silver Sulfadiazine cream causes burning over a large area of skin.  [name] Plavix [Clopidogrel] Other (See Comments)    Caused Nose bleed    Current Outpatient Prescriptions  Medication Sig Dispense Refill  . acetaminophen (TYLENOL) 500 MG tablet Take 500 mg by mouth every 6 (six) hours as needed for mild pain. Do not exceed 4 gms of tylenol in 24 hours    . aspirin EC 81 MG tablet Take 81 mg by mouth daily.    [name] atorvastatin (LIPITOR) 40 MG tablet Take 1 tablet (40 mg total) by mouth daily at 6 PM. 30 tablet 0  . B Complex-C-Folic Acid (DIALYVITE PO) Take 1 tablet by mouth every Monday, Wednesday, and Friday.    . bisacodyl (DULCOLAX) 5 MG EC tablet Take 2 tablets (10 mg total) by mouth daily as needed for moderate constipation. 30 tablet 0  . carvedilol (COREG) 12.5 MG tablet Take 1 tablet (12.5 mg total) by mouth See admin instructions. Take 1 tablet (12.5 mg) by mouth twice daily on Sunday, Tuesday, Thursday, Saturday (hold on dialysis days)    . cinacalcet (SENSIPAR) 30 MG tablet Take 30 mg by mouth every Monday, Wednesday, and Friday.     .  clopidogrel (PLAVIX) 75 MG tablet Take 75 mg by mouth daily.    . diphenhydrAMINE (BENADRYL) 25 mg capsule Take 25 mg by mouth every 6 (six) hours as needed.    [name] EPINEPHrine (EPIPEN 2-PAK) 0.3 mg/0.3 mL IJ SOAJ injection Inject 0.3 mg into the muscle once as needed (anaphylaxis).    . finasteride (PROSCAR) 5 MG tablet Take 1 tablet (5 mg total) by mouth daily. 30 tablet 0  . HYDROcodone-acetaminophen (NORCO/VICODIN) 5-325 MG tablet Take 1 tablet by  mouth every 4 (four) hours as needed for moderate pain. 10 tablet 0  . LANTUS SOLOSTAR 100 UNIT/ML Solostar Pen Inject 15 Units into the skin at bedtime. 15 mL 0  . levothyroxine (SYNTHROID, LEVOTHROID) 75 MCG tablet Take 75 mcg by mouth daily before breakfast.    . Multiple Vitamins-Minerals (MULTIVITAMIN WITH MINERALS) tablet Take 1 tablet by mouth daily.    [name] omeprazole (PRILOSEC) 40 MG capsule Take 40 mg by mouth daily.    . polyethylene glycol powder (GLYCOLAX/MIRALAX) powder Take 17 g by mouth daily. (Patient taking differently: Take 17 g by mouth daily. Mix in 8 oz liquid and drink) 255 g 0  . potassium chloride SA (K-DUR,KLOR-CON) 20 MEQ tablet Take 20 mEq by mouth daily.    [name] senna (SENOKOT) 8.6 MG TABS tablet Take 2 tablets (17.2 mg total) by mouth at bedtime. 120 each 0  . sevelamer carbonate (RENVELA) 800 MG tablet Take 2,400 mg by mouth 3 (three) times daily with meals.     . carvedilol (COREG) 6.25 MG tablet     . ramipril (ALTACE) 10 MG capsule      No current facility-administered medications for this visit.     Review of Systems  Constitutional:  Constitutional negative. Eyes: Eyes negative.  Cardiovascular: Cardiovascular negative.  GI: Gastrointestinal negative.  Musculoskeletal: Musculoskeletal negative.  Skin: Skin negative.  Neurological: Neurological negative. Hematologic: Positive for bruises/bleeds easily.  Psychiatric: Psychiatric negative.        Objective:  Objective   Vitals:   05/07/17 1327 05/07/17 1337    BP: (!) 193/75 (!) 184/72  Pulse: (!) 54 (!) 54  Resp: 18   Temp: (!) 97.3 F (36.3 C)   SpO2: 99%   Weight: 273 lb (123.8 kg)   Height: 6\' 2"  (1.88 m)    Body mass index is 35.05 kg/m.  Physical Exam  Constitutional: He is oriented to person, place, and time. He appears well-developed.  HENT:  Head: Normocephalic.  Eyes: Pupils are equal, round, and reactive to light.  Neck: Normal range of motion.  Cardiovascular: Normal rate.   Pulmonary/Chest: Effort normal.  Right ij tdc in place  Abdominal: Soft. He exhibits no mass.  Musculoskeletal: Normal range of motion. He exhibits no edema.  Well healed right bka  Neurological: He is alert and oriented to person, place, and time.  Skin: Skin is warm and dry.  Psychiatric: He has a normal mood and affect. Judgment and thought content normal.    Data: I reviewed his previous fistulogram which demonstrates occluded SVC with patent catheter which is using for dialysis at this time.     Assessment/Plan:     66yo Male with end-stage renal disease now in need of permanent access. He has a tunneled dialysis catheter which is accessed through central system. We will attempt to place a hero catheter the right upper extremity using the [facility] is our wire access point and then remove the tunneled catheter. TDC was then replaced in the right groin. I described this to him and he does demonstrate good understanding. We will get him scheduled in the near future.     Waynetta Sandy MD [facility]

## 2017-05-07 NOTE — Progress Notes (Signed)
Vitals:   05/07/17 1327  BP: (!) 193/75  Pulse: (!) 54  Resp: 18  Temp: (!) 97.3 F (36.3 C)  SpO2: 99%  Weight: 273 lb (123.8 kg)  Height: 6\' 2"  (1.88 m)

## 2017-05-08 DIAGNOSIS — N2581 Secondary hyperparathyroidism of renal origin: Secondary | ICD-10-CM | POA: Diagnosis not present

## 2017-05-08 DIAGNOSIS — D631 Anemia in chronic kidney disease: Secondary | ICD-10-CM | POA: Diagnosis not present

## 2017-05-08 DIAGNOSIS — Z992 Dependence on renal dialysis: Secondary | ICD-10-CM | POA: Diagnosis not present

## 2017-05-08 DIAGNOSIS — Z23 Encounter for immunization: Secondary | ICD-10-CM | POA: Diagnosis not present

## 2017-05-08 DIAGNOSIS — D509 Iron deficiency anemia, unspecified: Secondary | ICD-10-CM | POA: Diagnosis not present

## 2017-05-08 DIAGNOSIS — N186 End stage renal disease: Secondary | ICD-10-CM | POA: Diagnosis not present

## 2017-05-10 ENCOUNTER — Other Ambulatory Visit: Payer: Self-pay | Admitting: *Deleted

## 2017-05-10 DIAGNOSIS — D631 Anemia in chronic kidney disease: Secondary | ICD-10-CM | POA: Diagnosis not present

## 2017-05-10 DIAGNOSIS — D509 Iron deficiency anemia, unspecified: Secondary | ICD-10-CM | POA: Diagnosis not present

## 2017-05-10 DIAGNOSIS — Z23 Encounter for immunization: Secondary | ICD-10-CM | POA: Diagnosis not present

## 2017-05-10 DIAGNOSIS — N186 End stage renal disease: Secondary | ICD-10-CM | POA: Diagnosis not present

## 2017-05-10 DIAGNOSIS — N2581 Secondary hyperparathyroidism of renal origin: Secondary | ICD-10-CM | POA: Diagnosis not present

## 2017-05-10 DIAGNOSIS — Z992 Dependence on renal dialysis: Secondary | ICD-10-CM | POA: Diagnosis not present

## 2017-05-11 ENCOUNTER — Encounter (HOSPITAL_COMMUNITY): Payer: Self-pay | Admitting: *Deleted

## 2017-05-11 MED ORDER — ALTEPLASE 2 MG IJ SOLR: 2.0000 mg | Freq: Once | INTRAMUSCULAR | Status: DC | PRN

## 2017-05-11 MED ORDER — SODIUM CHLORIDE 0.9 % IV SOLN: 100.0000 mL | INTRAVENOUS | Status: DC | PRN

## 2017-05-11 MED ORDER — HEPARIN SODIUM (PORCINE) 1000 UNIT/ML DIALYSIS: 1000.0000 [IU] | INTRAMUSCULAR | Status: DC | PRN

## 2017-05-11 MED ORDER — PENTAFLUOROPROP-TETRAFLUOROETH EX AERO: 1.0000 "application " | INHALATION_SPRAY | CUTANEOUS | Status: DC | PRN

## 2017-05-11 MED ORDER — LIDOCAINE HCL (PF) 1 % IJ SOLN: 5.0000 mL | INTRAMUSCULAR | Status: DC | PRN

## 2017-05-11 MED ORDER — LIDOCAINE-PRILOCAINE 2.5-2.5 % EX CREA: 1.0000 "application " | TOPICAL_CREAM | CUTANEOUS | Status: DC | PRN

## 2017-05-11 NOTE — Progress Notes (Signed)
I spoke with patient's nurse Caryl Pina and reviewed history. Caryl Pina reported that patient's family is aware that patient is having surgery. I faxed patient's pre- procedure instruction to the SNF and reviewed it with Mackey Birchwood, Supervisior.

## 2017-05-11 NOTE — Pre-Procedure Instructions (Addendum)
    Jacob [name]  05/11/2017      Your procedure is scheduled on Thursday, October 11.  Report to [facility] Admitting at 9:15 AM    For questions or problems call the pre-op desk _ [phone]  THE NIGHT BEFORE SURGERY, take 8 units of __Lantus__insulin.    Remember:  Do not eat food or drink liquids after midnight Wednesday, October 10  Take these medicines the morning of surgery with A SIP OF WATER :  Aspirin EC           Carvedilol       Finesteride           Lipitor Prilosec  o Check your blood sugar the morning of your surgery when you wake up and every 2 hours until you get to the Short Stay unit.   . If your blood sugar is less than 70 mg/dL, you will need to treat for low blood sugar: o Treat a low blood sugar (less than 70 mg/dL) with 4 glucose tablets, OR glucose gel. o Recheck blood sugar in 15 minutes after treatment (to make sure it is greater than 70 mg/dL). If your blood sugar is not greater than 70 mg/dL on recheck, call [phone] for further instructions. . Report your blood sugar to the short stay nurse when you get to Short Stay.  May take Tylenol or Hydrocodone -acetaminophen of needed.    Orders are per Dr. Annye Asa

## 2017-05-12 DIAGNOSIS — D631 Anemia in chronic kidney disease: Secondary | ICD-10-CM | POA: Diagnosis not present

## 2017-05-12 DIAGNOSIS — Z23 Encounter for immunization: Secondary | ICD-10-CM | POA: Diagnosis not present

## 2017-05-12 DIAGNOSIS — D509 Iron deficiency anemia, unspecified: Secondary | ICD-10-CM | POA: Diagnosis not present

## 2017-05-12 DIAGNOSIS — N186 End stage renal disease: Secondary | ICD-10-CM | POA: Diagnosis not present

## 2017-05-12 DIAGNOSIS — N2581 Secondary hyperparathyroidism of renal origin: Secondary | ICD-10-CM | POA: Diagnosis not present

## 2017-05-12 DIAGNOSIS — Z992 Dependence on renal dialysis: Secondary | ICD-10-CM | POA: Diagnosis not present

## 2017-05-12 MED ORDER — VANCOMYCIN HCL 10 G IV SOLR
1500.0000 mg | INTRAVENOUS | Status: AC
  Administered 2017-05-13: 1500 mg via INTRAVENOUS
  Filled 2017-05-12: qty 1500

## 2017-05-13 ENCOUNTER — Ambulatory Visit (HOSPITAL_COMMUNITY): Payer: Medicare Other

## 2017-05-13 ENCOUNTER — Ambulatory Visit (HOSPITAL_COMMUNITY): Payer: Medicare Other | Admitting: Anesthesiology

## 2017-05-13 ENCOUNTER — Encounter (HOSPITAL_COMMUNITY): Payer: Self-pay

## 2017-05-13 ENCOUNTER — Encounter (HOSPITAL_COMMUNITY)
Admission: RE | Disposition: A | Discharge status: Skilled Nursing Facility | Payer: Self-pay | Source: Ambulatory Visit | Attending: Vascular Surgery

## 2017-05-13 ENCOUNTER — Ambulatory Visit (HOSPITAL_COMMUNITY)
Admission: RE | Admit: 2017-05-13 | Discharge: 2017-05-13 | Disposition: A | Discharge status: Skilled Nursing Facility | Payer: Medicare Other | Source: Ambulatory Visit | Attending: Vascular Surgery | Admitting: Vascular Surgery

## 2017-05-13 DIAGNOSIS — Z992 Dependence on renal dialysis: Secondary | ICD-10-CM | POA: Insufficient documentation

## 2017-05-13 DIAGNOSIS — M1990 Unspecified osteoarthritis, unspecified site: Secondary | ICD-10-CM | POA: Diagnosis not present

## 2017-05-13 DIAGNOSIS — E1151 Type 2 diabetes mellitus with diabetic peripheral angiopathy without gangrene: Secondary | ICD-10-CM | POA: Insufficient documentation

## 2017-05-13 DIAGNOSIS — F419 Anxiety disorder, unspecified: Secondary | ICD-10-CM | POA: Diagnosis not present

## 2017-05-13 DIAGNOSIS — N186 End stage renal disease: Secondary | ICD-10-CM | POA: Insufficient documentation

## 2017-05-13 DIAGNOSIS — F329 Major depressive disorder, single episode, unspecified: Secondary | ICD-10-CM | POA: Insufficient documentation

## 2017-05-13 DIAGNOSIS — Z419 Encounter for procedure for purposes other than remedying health state, unspecified: Secondary | ICD-10-CM

## 2017-05-13 DIAGNOSIS — E1122 Type 2 diabetes mellitus with diabetic chronic kidney disease: Secondary | ICD-10-CM | POA: Insufficient documentation

## 2017-05-13 DIAGNOSIS — D631 Anemia in chronic kidney disease: Secondary | ICD-10-CM | POA: Diagnosis not present

## 2017-05-13 DIAGNOSIS — K219 Gastro-esophageal reflux disease without esophagitis: Secondary | ICD-10-CM | POA: Insufficient documentation

## 2017-05-13 DIAGNOSIS — I120 Hypertensive chronic kidney disease with stage 5 chronic kidney disease or end stage renal disease: Secondary | ICD-10-CM | POA: Diagnosis not present

## 2017-05-13 DIAGNOSIS — Z452 Encounter for adjustment and management of vascular access device: Secondary | ICD-10-CM | POA: Diagnosis not present

## 2017-05-13 HISTORY — PX: VENOGRAM: SHX5497

## 2017-05-13 HISTORY — PX: VASCULAR ACCESS DEVICE INSERTION: SHX5158

## 2017-05-13 LAB — CBC
HEMATOCRIT: 34 % — AB (ref 39.0–52.0)
Hemoglobin: 9.9 g/dL — ABNORMAL LOW (ref 13.0–17.0)
MCH: 26.2 pg (ref 26.0–34.0)
MCHC: 29.1 g/dL — AB (ref 30.0–36.0)
MCV: 89.9 fL (ref 78.0–100.0)
PLATELETS: 237 10*3/uL (ref 150–400)
RBC: 3.78 MIL/uL — AB (ref 4.22–5.81)
RDW: 18.3 % — AB (ref 11.5–15.5)
WBC: 5.3 10*3/uL (ref 4.0–10.5)

## 2017-05-13 LAB — POCT I-STAT 4, (NA,K, GLUC, HGB,HCT)
Glucose, Bld: 79 mg/dL (ref 65–99)
HEMATOCRIT: 35 % — AB (ref 39.0–52.0)
HEMOGLOBIN: 11.9 g/dL — AB (ref 13.0–17.0)
Potassium: 4 mmol/L (ref 3.5–5.1)
Sodium: 140 mmol/L (ref 135–145)

## 2017-05-13 LAB — SURGICAL PCR SCREEN
MRSA, PCR: NEGATIVE
Staphylococcus aureus: NEGATIVE

## 2017-05-13 LAB — GLUCOSE, CAPILLARY
GLUCOSE-CAPILLARY: 76 mg/dL (ref 65–99)
Glucose-Capillary: 73 mg/dL (ref 65–99)

## 2017-05-13 LAB — PROTIME-INR
INR: 1.35
PROTHROMBIN TIME: 16.6 s — AB (ref 11.4–15.2)

## 2017-05-13 LAB — HEMOGLOBIN A1C
HEMOGLOBIN A1C: 6.2 % — AB (ref 4.8–5.6)
Mean Plasma Glucose: 131.24 mg/dL

## 2017-05-13 LAB — APTT: aPTT: 43 seconds — ABNORMAL HIGH (ref 24–36)

## 2017-05-13 SURGERY — INSERTION, CATHETER, HERO
Anesthesia: General | Site: Arm Upper | Laterality: Right

## 2017-05-13 MED ORDER — PROPOFOL 10 MG/ML IV BOLUS
INTRAVENOUS | Status: AC
  Filled 2017-05-13: qty 20

## 2017-05-13 MED ORDER — CHLORHEXIDINE GLUCONATE 4 % EX LIQD: 60.0000 mL | Freq: Once | CUTANEOUS | Status: DC

## 2017-05-13 MED ORDER — OXYCODONE HCL 5 MG PO TABS
ORAL_TABLET | ORAL | Status: AC
  Filled 2017-05-13: qty 1

## 2017-05-13 MED ORDER — ONDANSETRON HCL 4 MG/2ML IJ SOLN: 4.0000 mg | Freq: Four times a day (QID) | INTRAMUSCULAR | Status: DC | PRN

## 2017-05-13 MED ORDER — GLYCOPYRROLATE 0.2 MG/ML IJ SOLN
INTRAMUSCULAR | Status: DC | PRN
  Administered 2017-05-13: 0.2 mg via INTRAVENOUS
  Administered 2017-05-13: .6 mg via INTRAVENOUS

## 2017-05-13 MED ORDER — 0.9 % SODIUM CHLORIDE (POUR BTL) OPTIME
TOPICAL | Status: DC | PRN
  Administered 2017-05-13: 1000 mL

## 2017-05-13 MED ORDER — LIDOCAINE-EPINEPHRINE (PF) 1 %-1:200000 IJ SOLN
INTRAMUSCULAR | Status: AC
  Filled 2017-05-13: qty 30

## 2017-05-13 MED ORDER — OXYCODONE HCL 5 MG PO TABS
5.0000 mg | ORAL_TABLET | Freq: Once | ORAL | Status: AC | PRN
  Administered 2017-05-13: 5 mg via ORAL

## 2017-05-13 MED ORDER — SODIUM CHLORIDE 0.9 % IV SOLN
INTRAVENOUS | Status: DC | PRN
  Administered 2017-05-13: 10:00:00 via INTRAVENOUS

## 2017-05-13 MED ORDER — HEPARIN SODIUM (PORCINE) 1000 UNIT/ML IJ SOLN
INTRAMUSCULAR | Status: AC
  Filled 2017-05-13: qty 1

## 2017-05-13 MED ORDER — IODIXANOL 320 MG/ML IV SOLN
INTRAVENOUS | Status: DC | PRN
  Administered 2017-05-13: 10 mL via INTRAVENOUS

## 2017-05-13 MED ORDER — FENTANYL CITRATE (PF) 100 MCG/2ML IJ SOLN
INTRAMUSCULAR | Status: DC | PRN
  Administered 2017-05-13: 50 ug via INTRAVENOUS

## 2017-05-13 MED ORDER — MIDAZOLAM HCL 5 MG/5ML IJ SOLN
INTRAMUSCULAR | Status: DC | PRN
  Administered 2017-05-13: 2 mg via INTRAVENOUS

## 2017-05-13 MED ORDER — NEOSTIGMINE METHYLSULFATE 10 MG/10ML IV SOLN
INTRAVENOUS | Status: DC | PRN
  Administered 2017-05-13: 6 mg via INTRAVENOUS

## 2017-05-13 MED ORDER — OXYCODONE HCL 5 MG/5ML PO SOLN: 5.0000 mg | Freq: Once | ORAL | Status: AC | PRN

## 2017-05-13 MED ORDER — LIDOCAINE HCL (CARDIAC) 20 MG/ML IV SOLN
INTRAVENOUS | Status: DC | PRN
  Administered 2017-05-13: 40 mg via INTRAVENOUS
  Administered 2017-05-13: 60 mg via INTRAVENOUS

## 2017-05-13 MED ORDER — MIDAZOLAM HCL 2 MG/2ML IJ SOLN
INTRAMUSCULAR | Status: AC
  Filled 2017-05-13: qty 2

## 2017-05-13 MED ORDER — EPHEDRINE SULFATE 50 MG/ML IJ SOLN
INTRAMUSCULAR | Status: DC | PRN
  Administered 2017-05-13: 120 mg via INTRAVENOUS

## 2017-05-13 MED ORDER — MUPIROCIN 2 % EX OINT
TOPICAL_OINTMENT | CUTANEOUS | Status: AC
  Filled 2017-05-13: qty 22

## 2017-05-13 MED ORDER — PHENYLEPHRINE HCL 10 MG/ML IJ SOLN
INTRAVENOUS | Status: DC | PRN
  Administered 2017-05-13: 50 ug/min via INTRAVENOUS

## 2017-05-13 MED ORDER — SODIUM CHLORIDE 0.9 % IV SOLN: INTRAVENOUS | Status: DC

## 2017-05-13 MED ORDER — OXYCODONE-ACETAMINOPHEN 5-325 MG PO TABS: 1.0000 | ORAL_TABLET | Freq: Four times a day (QID) | ORAL | 0 refills | Status: DC | PRN

## 2017-05-13 MED ORDER — FENTANYL CITRATE (PF) 100 MCG/2ML IJ SOLN: 25.0000 ug | INTRAMUSCULAR | Status: DC | PRN

## 2017-05-13 MED ORDER — PROPOFOL 10 MG/ML IV BOLUS
INTRAVENOUS | Status: DC | PRN
  Administered 2017-05-13: 50 mg via INTRAVENOUS

## 2017-05-13 MED ORDER — SODIUM CHLORIDE 0.9 % IV SOLN
INTRAVENOUS | Status: DC | PRN
  Administered 2017-05-13: 11:00:00 500 mL

## 2017-05-13 MED ORDER — ROCURONIUM BROMIDE 100 MG/10ML IV SOLN
INTRAVENOUS | Status: DC | PRN
  Administered 2017-05-13: 60 mg via INTRAVENOUS

## 2017-05-13 MED ORDER — FENTANYL CITRATE (PF) 250 MCG/5ML IJ SOLN
INTRAMUSCULAR | Status: AC
  Filled 2017-05-13: qty 5

## 2017-05-13 MED ORDER — HYDROMORPHONE HCL 1 MG/ML IJ SOLN: 0.2500 mg | INTRAMUSCULAR | Status: DC | PRN

## 2017-05-13 SURGICAL SUPPLY — 70 items
ADH SKN CLS APL DERMABOND .7 (GAUZE/BANDAGES/DRESSINGS) ×3
ARMBAND PINK RESTRICT EXTREMIT (MISCELLANEOUS) ×5 IMPLANT
BAG DECANTER FOR FLEXI CONT (MISCELLANEOUS) ×5 IMPLANT
BIOPATCH RED 1 DISK 7.0 (GAUZE/BANDAGES/DRESSINGS) ×4 IMPLANT
BIOPATCH RED 1IN DISK 7.0MM (GAUZE/BANDAGES/DRESSINGS) ×1
CANISTER SUCT 3000ML PPV (MISCELLANEOUS) ×5 IMPLANT
CATH ANGIO 5F BER 65CM (CATHETERS) ×3 IMPLANT
CATH EMB 3FR 80CM (CATHETERS) ×3 IMPLANT
CATH PALINDROME RT-P 15FX19CM (CATHETERS) IMPLANT
CATH PALINDROME RT-P 15FX23CM (CATHETERS) IMPLANT
CATH PALINDROME RT-P 15FX28CM (CATHETERS) IMPLANT
CATH PALINDROME RT-P 15FX55CM (CATHETERS) IMPLANT
CLIP VESOCCLUDE MED 6/CT (CLIP) ×5 IMPLANT
CLIP VESOCCLUDE SM WIDE 6/CT (CLIP) ×5 IMPLANT
COMPONENT HERO ACCESSORY KIT (VASCULAR PRODUCTS) ×6 IMPLANT
COMPONENT HERO VENOUS OVERFLOW (Vascular Products) ×6 IMPLANT
COVER PROBE W GEL 5X96 (DRAPES) ×5 IMPLANT
COVER SURGICAL LIGHT HANDLE (MISCELLANEOUS) ×5 IMPLANT
DERMABOND ADVANCED (GAUZE/BANDAGES/DRESSINGS) ×2
DERMABOND ADVANCED .7 DNX12 (GAUZE/BANDAGES/DRESSINGS) ×3 IMPLANT
DRAPE C-ARM 42X72 X-RAY (DRAPES) ×5 IMPLANT
DRAPE CHEST BREAST 15X10 FENES (DRAPES) ×5 IMPLANT
ELECT REM PT RETURN 9FT ADLT (ELECTROSURGICAL) ×5
ELECTRODE REM PT RTRN 9FT ADLT (ELECTROSURGICAL) ×3 IMPLANT
GAUZE SPONGE 2X2 8PLY STRL LF (GAUZE/BANDAGES/DRESSINGS) IMPLANT
GAUZE SPONGE 4X4 16PLY XRAY LF (GAUZE/BANDAGES/DRESSINGS) ×5 IMPLANT
GLOVE BIO SURGEON STRL SZ7.5 (GLOVE) ×5 IMPLANT
GLOVE BIOGEL PI IND STRL 6.5 (GLOVE) ×3 IMPLANT
GLOVE BIOGEL PI INDICATOR 6.5 (GLOVE) ×6
GLOVE SURG SS PI 6.0 STRL IVOR (GLOVE) ×3 IMPLANT
GLOVE SURG SS PI 6.5 STRL IVOR (GLOVE) ×3 IMPLANT
GOWN STRL REUS W/ TWL LRG LVL3 (GOWN DISPOSABLE) ×7 IMPLANT
GOWN STRL REUS W/ TWL XL LVL3 (GOWN DISPOSABLE) ×4 IMPLANT
GOWN STRL REUS W/TWL LRG LVL3 (GOWN DISPOSABLE) ×15
GOWN STRL REUS W/TWL XL LVL3 (GOWN DISPOSABLE) ×10
GRAFT ACUSEAL GORE 6X40CM (Vascular Products) ×3 IMPLANT
KIT BASIN OR (CUSTOM PROCEDURE TRAY) ×5 IMPLANT
KIT ROOM TURNOVER OR (KITS) ×5 IMPLANT
NDL 18GX1X1/2 (RX/OR ONLY) (NEEDLE) ×2 IMPLANT
NDL HYPO 25GX1X1/2 BEV (NEEDLE) ×2 IMPLANT
NDL PERC 18GX7CM (NEEDLE) ×2 IMPLANT
NEEDLE 18GX1X1/2 (RX/OR ONLY) (NEEDLE) ×5 IMPLANT
NEEDLE HYPO 25GX1X1/2 BEV (NEEDLE) ×5 IMPLANT
NEEDLE PERC 18GX7CM (NEEDLE) ×5 IMPLANT
NS IRRIG 1000ML POUR BTL (IV SOLUTION) ×5 IMPLANT
PACK CV ACCESS (CUSTOM PROCEDURE TRAY) ×5 IMPLANT
PACK SURGICAL SETUP 50X90 (CUSTOM PROCEDURE TRAY) ×5 IMPLANT
PAD ARMBOARD 7.5X6 YLW CONV (MISCELLANEOUS) ×10 IMPLANT
SHEATH PINNACLE 9F 10CM (SHEATH) ×3 IMPLANT
SOAP 2 % CHG 4 OZ (WOUND CARE) ×5 IMPLANT
SPONGE GAUZE 2X2 STER 10/PKG (GAUZE/BANDAGES/DRESSINGS)
SPONGE LAP 18X18 X RAY DECT (DISPOSABLE) ×3 IMPLANT
SUT ETHILON 3 0 PS 1 (SUTURE) ×8 IMPLANT
SUT MNCRL AB 4-0 PS2 18 (SUTURE) ×8 IMPLANT
SUT PROLENE 6 0 BV (SUTURE) ×8 IMPLANT
SUT SILK 2 0 FS (SUTURE) ×3 IMPLANT
SUT VIC AB 2-0 CT1 36 (SUTURE) ×3 IMPLANT
SUT VIC AB 3-0 SH 27 (SUTURE) ×10
SUT VIC AB 3-0 SH 27X BRD (SUTURE) ×4 IMPLANT
SYR 10ML LL (SYRINGE) ×8 IMPLANT
SYR 20CC LL (SYRINGE) ×10 IMPLANT
SYR 30ML LL (SYRINGE) IMPLANT
SYR 5ML LL (SYRINGE) ×5 IMPLANT
SYR CONTROL 10ML LL (SYRINGE) ×5 IMPLANT
SYRINGE 20CC LL (MISCELLANEOUS) ×3 IMPLANT
UNDERPAD 30X30 (UNDERPADS AND DIAPERS) ×5 IMPLANT
WATER STERILE IRR 1000ML POUR (IV SOLUTION) ×5 IMPLANT
WIRE AMPLATZ SS-J .035X180CM (WIRE) ×3 IMPLANT
WIRE BENTSON .035X145CM (WIRE) ×3 IMPLANT
WIRE J 3MM .035X145CM (WIRE) ×5 IMPLANT

## 2017-05-13 NOTE — H&P (Signed)
   History and Physical Update  The patient was interviewed and re-examined.  The patient's previous History and Physical has been reviewed and is unchanged from recent office visit. Plan for HeRO in right arm and convert tdc to femoral.  Brandon C. Donzetta Matters, MD [facility] [place] Office: [phone] Pager: [phone]   05/13/2017, 9:18 AM

## 2017-05-13 NOTE — Transfer of Care (Signed)
Immediate Anesthesia Transfer of Care Note  Patient: Jacob [name]  Procedure(s) Performed: INSERTION OF HERO VASCULAR ACCESS DEVICE RIGHT UPPER ARM (Right Arm Upper) CENTRAL VENOGRAM (N/A )  Patient Location: PACU  Anesthesia Type:General  Level of Consciousness: awake, alert  and oriented  Airway & Oxygen Therapy: Patient Spontanous Breathing and Patient connected to face mask oxygen  Post-op Assessment: Report given to RN, Post -op Vital signs reviewed and stable and Patient moving all extremities X 4  Post vital signs: Reviewed and stable  Last Vitals:  Vitals:   05/13/17 0920 05/13/17 0922  BP:  (!) 195/91  Pulse: (!) 55   Resp: 18   Temp: 36.5 C   SpO2: 100%     Last Pain: There were no vitals filed for this visit.    Patients Stated Pain Goal: 1 (24/82/50 0370)  Complications: No apparent anesthesia complications

## 2017-05-13 NOTE — Anesthesia Preprocedure Evaluation (Addendum)
Anesthesia Evaluation  Patient identified by MRN, date of birth, ID band Patient awake    Reviewed: Allergy & Precautions, H&P , NPO status , Patient's Chart, lab work & pertinent test results  Airway Mallampati: II   Neck ROM: full    Dental  (+) Dental Advidsory Given   Pulmonary shortness of breath,    breath sounds clear to auscultation       Cardiovascular hypertension, + Peripheral Vascular Disease   Rhythm:regular Rate:Normal     Neuro/Psych PSYCHIATRIC DISORDERS Anxiety Depression CVA    GI/Hepatic GERD  ,  Endo/Other  diabetes, Type 2Hypothyroidism   Renal/GU ESRF and DialysisRenal disease     Musculoskeletal  (+) Arthritis ,   Abdominal   Peds  Hematology  (+) anemia ,   Anesthesia Other Findings   Reproductive/Obstetrics                            Anesthesia Physical Anesthesia Plan  ASA: IV  Anesthesia Plan: General   Post-op Pain Management:    Induction: Intravenous  PONV Risk Score and Plan: 2 and Ondansetron and Dexamethasone  Airway Management Planned: LMA  Additional Equipment:   Intra-op Plan:   Post-operative Plan: Extubation in OR  Informed Consent: I have reviewed the patients History and Physical, chart, labs and discussed the procedure including the risks, benefits and alternatives for the proposed anesthesia with the patient or authorized representative who has indicated his/her understanding and acceptance.   Dental Advisory Given  Plan Discussed with: CRNA, Anesthesiologist and Surgeon  Anesthesia Plan Comments:       Anesthesia Quick Evaluation

## 2017-05-13 NOTE — Op Note (Signed)
Patient name: Jacob [name] MRN: [id number] DOB: March 11, 1951 Sex: male  05/13/2017 Pre-operative Diagnosis: esrd Post-operative diagnosis:  Same Surgeon:  Erlene Quan C. Donzetta Matters, MD Procedure Performed: 1.  Central venogram 2.  Right brachial HeRO graft with 23mm accuseal graft  Indications:  66yo with end-stage renal disease currently on dialysis via  right IJ tunneled catheter. He has a known innominate vein occlusion on the right as well as stent occlusions on the left but is open centrally by recent fistulogram. He is therefore indicated for placement of a hero graft with possible tunneled dialysis catheter.  Findings: Central venous system is patent. The brachial artery is 5 mm external diameter with minimal disease. At completion a palpable thrill in the graft. Radial artery has monophasic signal by Doppler and there is a continuous flow Doppler signal in the out flow graft on the chest.   Procedure:  The patient was identified in the holding area and taken to room 11.  The patient was then placed supine on the table and prepped and draped in the usual sterile fashion.  A time out was called. Antibiotics were administered.  we initially made a incision in the right neck at the level of his tunneled dialysis catheter and dissected this free and clamped it. Call dialysis catheter was then transected at the level of the skin and was pulled through the neck incision. I then placed a Amplatz wire through the catheter and removed the catheter and placed a long 9 French sheath into the right atrium. Bare catheter was used to direct the wire into the IVC. Central venogram demonstrated patent SVC. The hero graft introducer sheath was then placed under fluoroscopic guidance and the outflow portion of the graft was then tunneled from a counter incision at the . This portion was then flushed with heparinized saline and a thrombectomy with a 3 Fogarty was then performed to get backbleeding and was then clamped. A 6  mm AcuSeal graft was then brought to the table and the hero connection ports was sewn end and with the axial graft with 6-0 Prolene suture after beveling both sides. An incision overlying the brachial artery in the upper arm was then made longitudinally I dissected down identified the brachial vein as well as the median nerve protected these. A vessel loop was placed around the brachial artery. A tunnel was then created between this incision in the deltopectoral groove and the graft was then tunneled. The outflow portion was then trimmed to size and segments were connected. Fluoroscopic guidance demonstrated the outflow portion and the right atrium. With this the axial graft was then beveled and trimmed to size at the level of the brachial artery incision. The brachial artery was clamped distally and proximally and opened longitudinally and flushed with heparinized saline in both directions. The graft was then sewn end-to-side with 6-0 Prolene suture. Prior to completing the anastomosis we then allowed antegrade and retrograde flushing. On completion was a palpable thrill in the graft as well as a monophasic signal at the radial artery at the level of the wrist that did not augment with compression of the graft. Fluoroscopically the hero graft had a smooth transition and terminated in the right atrium. Satisfied with this we obtained hemostasis of our wounds closed the neck wound with a 4-0 Monocryl suture only in the other 2 with Vicryl suture deep and for Monocryl at the level of the skin. Dermabond was placed to the level of the skin for all incisions.  Patient tolerated procedure well without immediate complication.  Blood loss: 200 mL.    Josetta Wigal C. Donzetta Matters, MD [facility] [place] Office: [phone] Pager: [phone]

## 2017-05-13 NOTE — Anesthesia Postprocedure Evaluation (Signed)
Anesthesia Post Note  Patient: Jacob [name]  Procedure(s) Performed: INSERTION OF HERO VASCULAR ACCESS DEVICE RIGHT UPPER ARM (Right Arm Upper) CENTRAL VENOGRAM (N/A )     Patient location during evaluation: PACU Anesthesia Type: General Level of consciousness: awake Pain management: pain level controlled Vital Signs Assessment: post-procedure vital signs reviewed and stable Respiratory status: spontaneous breathing Cardiovascular status: stable Anesthetic complications: no    Last Vitals:  Vitals:   05/13/17 1330 05/13/17 1415  BP: (!) 147/62 (!) 157/72  Pulse: (!) 56 60  Resp: 14 12  Temp:    SpO2: 97% 96%    Last Pain:  Vitals:   05/13/17 1526  PainSc: 4                  Fitz Matsuo

## 2017-05-13 NOTE — Anesthesia Procedure Notes (Signed)
Procedure Name: Intubation Date/Time: 05/13/2017 10:33 AM Performed by: Neldon Newport Pre-anesthesia Checklist: Timeout performed, Patient being monitored, Suction available, Emergency Drugs available and Patient identified Patient Re-evaluated:Patient Re-evaluated prior to induction Oxygen Delivery Method: Circle system utilized Preoxygenation: Pre-oxygenation with 100% oxygen Induction Type: IV induction Ventilation: Mask ventilation without difficulty and Oral airway inserted - appropriate to patient size LMA: LMA inserted LMA Size: 5.0 Laryngoscope Size: Mac and 4 Grade View: Grade I Tube type: Oral Tube size: 7.5 mm Number of attempts: 2 Placement Confirmation: breath sounds checked- equal and bilateral,  positive ETCO2 and ETT inserted through vocal cords under direct vision Secured at: 23 cm Tube secured with: Tape Dental Injury: Teeth and Oropharynx as per pre-operative assessment

## 2017-05-14 ENCOUNTER — Encounter (HOSPITAL_COMMUNITY): Payer: Self-pay | Admitting: Vascular Surgery

## 2017-05-17 DIAGNOSIS — Z23 Encounter for immunization: Secondary | ICD-10-CM | POA: Diagnosis not present

## 2017-05-17 DIAGNOSIS — D509 Iron deficiency anemia, unspecified: Secondary | ICD-10-CM | POA: Diagnosis not present

## 2017-05-17 DIAGNOSIS — N186 End stage renal disease: Secondary | ICD-10-CM | POA: Diagnosis not present

## 2017-05-17 DIAGNOSIS — N2581 Secondary hyperparathyroidism of renal origin: Secondary | ICD-10-CM | POA: Diagnosis not present

## 2017-05-17 DIAGNOSIS — Z992 Dependence on renal dialysis: Secondary | ICD-10-CM | POA: Diagnosis not present

## 2017-05-17 DIAGNOSIS — D631 Anemia in chronic kidney disease: Secondary | ICD-10-CM | POA: Diagnosis not present

## 2017-05-18 ENCOUNTER — Other Ambulatory Visit: Payer: Self-pay | Admitting: Licensed Clinical Social Worker

## 2017-05-18 NOTE — Patient Outreach (Signed)
Assessment:  CSW spoke via phone with client. CSW verified client identity. CSW and client spoke of client needs.  Client has been residing at [facility] facility in [place], [place]. He has been receiving nursing care and physical therapy support at that facility. He has support also from his sister, who visits him regularly. He said he is eating well and sleeping well. He is receiving physical therapy sessions as scheduled at nursing facility. He said that physical therapy sessions he is receiving are helpful to client. CSW and client spoke of client care plan. CSW encouraged client to participate in scheduled client physical therapy sessions for client in next 30 days at nursing center. Client uses a wheelchair to assist him in ambulation.  Client receives dialysis treatments 3 times weekly as scheduled. Client did not mention any pain issues. Client said that he did receive a dialysis treatment today.  Client said he is attending medical appointments for client as scheduled. Client said he had gone to appointment recently regarding prosthetic device for client. Client said he is hoping to be able to receive his prosthetic device in next few weeks . He said he has used his wheelchair for several months and is looking forward to receiving prosthetic device to help client in ambulation. He said his sister is supportive. CSW encouraged client to call CSW at [phone] as needed to discuss social work needs of client. CSW thanked client for phone call with CSW on 05/18/17. Client was appreciative of phone call from [place] on 05/18/17.    Plan:  Client to participate in scheduled client physical therapy sessions for client in next 30 days at nursing center.   CSW to call client in 4 weeks to assess client needs at that time.  Norva Riffle.Anjolaoluwa Siguenza MSW, LCSW Licensed Clinical Social Worker [facility] Care Management [phone]

## 2017-05-19 DIAGNOSIS — N186 End stage renal disease: Secondary | ICD-10-CM | POA: Diagnosis not present

## 2017-05-19 DIAGNOSIS — N2581 Secondary hyperparathyroidism of renal origin: Secondary | ICD-10-CM | POA: Diagnosis not present

## 2017-05-19 DIAGNOSIS — D631 Anemia in chronic kidney disease: Secondary | ICD-10-CM | POA: Diagnosis not present

## 2017-05-19 DIAGNOSIS — Z23 Encounter for immunization: Secondary | ICD-10-CM | POA: Diagnosis not present

## 2017-05-19 DIAGNOSIS — D509 Iron deficiency anemia, unspecified: Secondary | ICD-10-CM | POA: Diagnosis not present

## 2017-05-19 DIAGNOSIS — Z992 Dependence on renal dialysis: Secondary | ICD-10-CM | POA: Diagnosis not present

## 2017-05-20 DIAGNOSIS — Z89511 Acquired absence of right leg below knee: Secondary | ICD-10-CM | POA: Diagnosis not present

## 2017-05-20 DIAGNOSIS — Z4781 Encounter for orthopedic aftercare following surgical amputation: Secondary | ICD-10-CM | POA: Diagnosis not present

## 2017-05-20 DIAGNOSIS — T829XXA Unspecified complication of cardiac and vascular prosthetic device, implant and graft, initial encounter: Secondary | ICD-10-CM | POA: Diagnosis not present

## 2017-05-21 DIAGNOSIS — N2581 Secondary hyperparathyroidism of renal origin: Secondary | ICD-10-CM | POA: Diagnosis not present

## 2017-05-21 DIAGNOSIS — N186 End stage renal disease: Secondary | ICD-10-CM | POA: Diagnosis not present

## 2017-05-21 DIAGNOSIS — D509 Iron deficiency anemia, unspecified: Secondary | ICD-10-CM | POA: Diagnosis not present

## 2017-05-21 DIAGNOSIS — E119 Type 2 diabetes mellitus without complications: Secondary | ICD-10-CM | POA: Diagnosis not present

## 2017-05-21 DIAGNOSIS — Z89511 Acquired absence of right leg below knee: Secondary | ICD-10-CM | POA: Diagnosis not present

## 2017-05-21 DIAGNOSIS — Z4781 Encounter for orthopedic aftercare following surgical amputation: Secondary | ICD-10-CM | POA: Diagnosis not present

## 2017-05-21 DIAGNOSIS — Z992 Dependence on renal dialysis: Secondary | ICD-10-CM | POA: Diagnosis not present

## 2017-05-21 DIAGNOSIS — Z23 Encounter for immunization: Secondary | ICD-10-CM | POA: Diagnosis not present

## 2017-05-21 DIAGNOSIS — D631 Anemia in chronic kidney disease: Secondary | ICD-10-CM | POA: Diagnosis not present

## 2017-05-26 DIAGNOSIS — Z992 Dependence on renal dialysis: Secondary | ICD-10-CM | POA: Diagnosis not present

## 2017-05-26 DIAGNOSIS — D631 Anemia in chronic kidney disease: Secondary | ICD-10-CM | POA: Diagnosis not present

## 2017-05-26 DIAGNOSIS — D509 Iron deficiency anemia, unspecified: Secondary | ICD-10-CM | POA: Diagnosis not present

## 2017-05-26 DIAGNOSIS — N186 End stage renal disease: Secondary | ICD-10-CM | POA: Diagnosis not present

## 2017-05-26 DIAGNOSIS — N2581 Secondary hyperparathyroidism of renal origin: Secondary | ICD-10-CM | POA: Diagnosis not present

## 2017-05-26 DIAGNOSIS — Z23 Encounter for immunization: Secondary | ICD-10-CM | POA: Diagnosis not present

## 2017-05-28 DIAGNOSIS — Z992 Dependence on renal dialysis: Secondary | ICD-10-CM | POA: Diagnosis not present

## 2017-05-28 DIAGNOSIS — N186 End stage renal disease: Secondary | ICD-10-CM | POA: Diagnosis not present

## 2017-05-28 DIAGNOSIS — D509 Iron deficiency anemia, unspecified: Secondary | ICD-10-CM | POA: Diagnosis not present

## 2017-05-28 DIAGNOSIS — Z23 Encounter for immunization: Secondary | ICD-10-CM | POA: Diagnosis not present

## 2017-05-28 DIAGNOSIS — D631 Anemia in chronic kidney disease: Secondary | ICD-10-CM | POA: Diagnosis not present

## 2017-05-28 DIAGNOSIS — N2581 Secondary hyperparathyroidism of renal origin: Secondary | ICD-10-CM | POA: Diagnosis not present

## 2017-05-31 DIAGNOSIS — D509 Iron deficiency anemia, unspecified: Secondary | ICD-10-CM | POA: Diagnosis not present

## 2017-05-31 DIAGNOSIS — Z23 Encounter for immunization: Secondary | ICD-10-CM | POA: Diagnosis not present

## 2017-05-31 DIAGNOSIS — D631 Anemia in chronic kidney disease: Secondary | ICD-10-CM | POA: Diagnosis not present

## 2017-05-31 DIAGNOSIS — Z992 Dependence on renal dialysis: Secondary | ICD-10-CM | POA: Diagnosis not present

## 2017-05-31 DIAGNOSIS — N2581 Secondary hyperparathyroidism of renal origin: Secondary | ICD-10-CM | POA: Diagnosis not present

## 2017-05-31 DIAGNOSIS — N186 End stage renal disease: Secondary | ICD-10-CM | POA: Diagnosis not present

## 2017-06-01 DIAGNOSIS — Z992 Dependence on renal dialysis: Secondary | ICD-10-CM | POA: Diagnosis not present

## 2017-06-01 DIAGNOSIS — N186 End stage renal disease: Secondary | ICD-10-CM | POA: Diagnosis not present

## 2017-06-02 DIAGNOSIS — N186 End stage renal disease: Secondary | ICD-10-CM | POA: Diagnosis not present

## 2017-06-02 DIAGNOSIS — D631 Anemia in chronic kidney disease: Secondary | ICD-10-CM | POA: Diagnosis not present

## 2017-06-02 DIAGNOSIS — D509 Iron deficiency anemia, unspecified: Secondary | ICD-10-CM | POA: Diagnosis not present

## 2017-06-02 DIAGNOSIS — Z992 Dependence on renal dialysis: Secondary | ICD-10-CM | POA: Diagnosis not present

## 2017-06-02 DIAGNOSIS — Z23 Encounter for immunization: Secondary | ICD-10-CM | POA: Diagnosis not present

## 2017-06-02 DIAGNOSIS — N2581 Secondary hyperparathyroidism of renal origin: Secondary | ICD-10-CM | POA: Diagnosis not present

## 2017-06-03 DIAGNOSIS — Z89511 Acquired absence of right leg below knee: Secondary | ICD-10-CM | POA: Diagnosis not present

## 2017-06-03 DIAGNOSIS — I10 Essential (primary) hypertension: Secondary | ICD-10-CM | POA: Diagnosis not present

## 2017-06-03 DIAGNOSIS — Z4781 Encounter for orthopedic aftercare following surgical amputation: Secondary | ICD-10-CM | POA: Diagnosis not present

## 2017-06-03 DIAGNOSIS — E119 Type 2 diabetes mellitus without complications: Secondary | ICD-10-CM | POA: Diagnosis not present

## 2017-06-03 DIAGNOSIS — D500 Iron deficiency anemia secondary to blood loss (chronic): Secondary | ICD-10-CM | POA: Diagnosis not present

## 2017-06-04 DIAGNOSIS — N2581 Secondary hyperparathyroidism of renal origin: Secondary | ICD-10-CM | POA: Diagnosis not present

## 2017-06-04 DIAGNOSIS — D509 Iron deficiency anemia, unspecified: Secondary | ICD-10-CM | POA: Diagnosis not present

## 2017-06-04 DIAGNOSIS — Z992 Dependence on renal dialysis: Secondary | ICD-10-CM | POA: Diagnosis not present

## 2017-06-04 DIAGNOSIS — Z89511 Acquired absence of right leg below knee: Secondary | ICD-10-CM | POA: Diagnosis not present

## 2017-06-04 DIAGNOSIS — N186 End stage renal disease: Secondary | ICD-10-CM | POA: Diagnosis not present

## 2017-06-04 DIAGNOSIS — D631 Anemia in chronic kidney disease: Secondary | ICD-10-CM | POA: Diagnosis not present

## 2017-06-05 DIAGNOSIS — Z89511 Acquired absence of right leg below knee: Secondary | ICD-10-CM | POA: Diagnosis not present

## 2017-06-07 DIAGNOSIS — Z89511 Acquired absence of right leg below knee: Secondary | ICD-10-CM | POA: Diagnosis not present

## 2017-06-07 DIAGNOSIS — D509 Iron deficiency anemia, unspecified: Secondary | ICD-10-CM | POA: Diagnosis not present

## 2017-06-07 DIAGNOSIS — N186 End stage renal disease: Secondary | ICD-10-CM | POA: Diagnosis not present

## 2017-06-07 DIAGNOSIS — D631 Anemia in chronic kidney disease: Secondary | ICD-10-CM | POA: Diagnosis not present

## 2017-06-07 DIAGNOSIS — Z992 Dependence on renal dialysis: Secondary | ICD-10-CM | POA: Diagnosis not present

## 2017-06-07 DIAGNOSIS — N2581 Secondary hyperparathyroidism of renal origin: Secondary | ICD-10-CM | POA: Diagnosis not present

## 2017-06-08 DIAGNOSIS — E119 Type 2 diabetes mellitus without complications: Secondary | ICD-10-CM | POA: Diagnosis not present

## 2017-06-08 DIAGNOSIS — Z89511 Acquired absence of right leg below knee: Secondary | ICD-10-CM | POA: Diagnosis not present

## 2017-06-08 DIAGNOSIS — N186 End stage renal disease: Secondary | ICD-10-CM | POA: Diagnosis not present

## 2017-06-08 DIAGNOSIS — Z4781 Encounter for orthopedic aftercare following surgical amputation: Secondary | ICD-10-CM | POA: Diagnosis not present

## 2017-06-09 DIAGNOSIS — D509 Iron deficiency anemia, unspecified: Secondary | ICD-10-CM | POA: Diagnosis not present

## 2017-06-09 DIAGNOSIS — D631 Anemia in chronic kidney disease: Secondary | ICD-10-CM | POA: Diagnosis not present

## 2017-06-09 DIAGNOSIS — N2581 Secondary hyperparathyroidism of renal origin: Secondary | ICD-10-CM | POA: Diagnosis not present

## 2017-06-09 DIAGNOSIS — Z89511 Acquired absence of right leg below knee: Secondary | ICD-10-CM | POA: Diagnosis not present

## 2017-06-09 DIAGNOSIS — Z992 Dependence on renal dialysis: Secondary | ICD-10-CM | POA: Diagnosis not present

## 2017-06-09 DIAGNOSIS — N186 End stage renal disease: Secondary | ICD-10-CM | POA: Diagnosis not present

## 2017-06-14 DIAGNOSIS — Z992 Dependence on renal dialysis: Secondary | ICD-10-CM | POA: Diagnosis not present

## 2017-06-14 DIAGNOSIS — N2581 Secondary hyperparathyroidism of renal origin: Secondary | ICD-10-CM | POA: Diagnosis not present

## 2017-06-14 DIAGNOSIS — D509 Iron deficiency anemia, unspecified: Secondary | ICD-10-CM | POA: Diagnosis not present

## 2017-06-14 DIAGNOSIS — N186 End stage renal disease: Secondary | ICD-10-CM | POA: Diagnosis not present

## 2017-06-14 DIAGNOSIS — D631 Anemia in chronic kidney disease: Secondary | ICD-10-CM | POA: Diagnosis not present

## 2017-06-15 DIAGNOSIS — E1122 Type 2 diabetes mellitus with diabetic chronic kidney disease: Secondary | ICD-10-CM | POA: Diagnosis not present

## 2017-06-15 DIAGNOSIS — Z4781 Encounter for orthopedic aftercare following surgical amputation: Secondary | ICD-10-CM | POA: Diagnosis not present

## 2017-06-15 DIAGNOSIS — I120 Hypertensive chronic kidney disease with stage 5 chronic kidney disease or end stage renal disease: Secondary | ICD-10-CM | POA: Diagnosis not present

## 2017-06-15 DIAGNOSIS — E1151 Type 2 diabetes mellitus with diabetic peripheral angiopathy without gangrene: Secondary | ICD-10-CM | POA: Diagnosis not present

## 2017-06-15 DIAGNOSIS — D631 Anemia in chronic kidney disease: Secondary | ICD-10-CM | POA: Diagnosis not present

## 2017-06-15 DIAGNOSIS — N186 End stage renal disease: Secondary | ICD-10-CM | POA: Diagnosis not present

## 2017-06-16 DIAGNOSIS — D631 Anemia in chronic kidney disease: Secondary | ICD-10-CM | POA: Diagnosis not present

## 2017-06-16 DIAGNOSIS — Z992 Dependence on renal dialysis: Secondary | ICD-10-CM | POA: Diagnosis not present

## 2017-06-16 DIAGNOSIS — D509 Iron deficiency anemia, unspecified: Secondary | ICD-10-CM | POA: Diagnosis not present

## 2017-06-16 DIAGNOSIS — N186 End stage renal disease: Secondary | ICD-10-CM | POA: Diagnosis not present

## 2017-06-16 DIAGNOSIS — N2581 Secondary hyperparathyroidism of renal origin: Secondary | ICD-10-CM | POA: Diagnosis not present

## 2017-06-17 DIAGNOSIS — I120 Hypertensive chronic kidney disease with stage 5 chronic kidney disease or end stage renal disease: Secondary | ICD-10-CM | POA: Diagnosis not present

## 2017-06-17 DIAGNOSIS — E1122 Type 2 diabetes mellitus with diabetic chronic kidney disease: Secondary | ICD-10-CM | POA: Diagnosis not present

## 2017-06-17 DIAGNOSIS — N186 End stage renal disease: Secondary | ICD-10-CM | POA: Diagnosis not present

## 2017-06-17 DIAGNOSIS — D631 Anemia in chronic kidney disease: Secondary | ICD-10-CM | POA: Diagnosis not present

## 2017-06-17 DIAGNOSIS — Z4781 Encounter for orthopedic aftercare following surgical amputation: Secondary | ICD-10-CM | POA: Diagnosis not present

## 2017-06-17 DIAGNOSIS — E1151 Type 2 diabetes mellitus with diabetic peripheral angiopathy without gangrene: Secondary | ICD-10-CM | POA: Diagnosis not present

## 2017-06-18 ENCOUNTER — Other Ambulatory Visit: Payer: Self-pay | Admitting: Licensed Clinical Social Worker

## 2017-06-18 ENCOUNTER — Encounter: Payer: Self-pay | Admitting: Licensed Clinical Social Worker

## 2017-06-18 DIAGNOSIS — Z992 Dependence on renal dialysis: Secondary | ICD-10-CM | POA: Diagnosis not present

## 2017-06-18 DIAGNOSIS — D509 Iron deficiency anemia, unspecified: Secondary | ICD-10-CM | POA: Diagnosis not present

## 2017-06-18 DIAGNOSIS — D631 Anemia in chronic kidney disease: Secondary | ICD-10-CM | POA: Diagnosis not present

## 2017-06-18 DIAGNOSIS — N2581 Secondary hyperparathyroidism of renal origin: Secondary | ICD-10-CM | POA: Diagnosis not present

## 2017-06-18 DIAGNOSIS — N186 End stage renal disease: Secondary | ICD-10-CM | POA: Diagnosis not present

## 2017-06-18 NOTE — Patient Outreach (Signed)
Assessment:  CSW spoke via phone with client. CSW verified client identity. CSW and client spoke of client needs. Client has been residing at [facility] facility in [place], [place].  He has been receiving nursing care and physical therapy support at that facility.  He has support also from his sister, who visits him regularly.  Client receives dialysis treatments 3 times weekly as scheduled. CSW informed client on 06/18/17 that client had met client's care plan goals with [facility] CSW services.. Thus, CSW informed client  on 06/18/17 that [place] would discharge client from [place] services on 06/18/17 since client had met client's care plan goals. Client agreed to this plan. He said he was receiving care needed at [facility] facility. CSW congratulated client  on meeting client's care plan goals with [facility] CSW services. Client was appreciative of support received from [place] in recent months.   Plan:  CSW is discharging [employer] [place] from [place] services on 06/18/17 since client has met care plan goals for client with CSW services.  CSW to inform Alycia Rossetti, Case Management Assistant, that [place] discharged client from [facility] CSW services on 06/18/17.  CSW to fax physician case closure letter to Dr. Hilma Favors informing Dr .Hilma Favors that [facility] discharged client on 06/18/17 from [place] services.     Norva Riffle.Shantavia Jha MSW, LCSW Licensed Clinical Social Worker [facility] Care Management [phone]                       Plan:

## 2017-06-21 DIAGNOSIS — D631 Anemia in chronic kidney disease: Secondary | ICD-10-CM | POA: Diagnosis not present

## 2017-06-21 DIAGNOSIS — N2581 Secondary hyperparathyroidism of renal origin: Secondary | ICD-10-CM | POA: Diagnosis not present

## 2017-06-21 DIAGNOSIS — Z992 Dependence on renal dialysis: Secondary | ICD-10-CM | POA: Diagnosis not present

## 2017-06-21 DIAGNOSIS — N186 End stage renal disease: Secondary | ICD-10-CM | POA: Diagnosis not present

## 2017-06-21 DIAGNOSIS — D509 Iron deficiency anemia, unspecified: Secondary | ICD-10-CM | POA: Diagnosis not present

## 2017-06-22 DIAGNOSIS — E1151 Type 2 diabetes mellitus with diabetic peripheral angiopathy without gangrene: Secondary | ICD-10-CM | POA: Diagnosis not present

## 2017-06-22 DIAGNOSIS — N186 End stage renal disease: Secondary | ICD-10-CM | POA: Diagnosis not present

## 2017-06-22 DIAGNOSIS — D631 Anemia in chronic kidney disease: Secondary | ICD-10-CM | POA: Diagnosis not present

## 2017-06-22 DIAGNOSIS — Z4781 Encounter for orthopedic aftercare following surgical amputation: Secondary | ICD-10-CM | POA: Diagnosis not present

## 2017-06-22 DIAGNOSIS — E1122 Type 2 diabetes mellitus with diabetic chronic kidney disease: Secondary | ICD-10-CM | POA: Diagnosis not present

## 2017-06-22 DIAGNOSIS — I120 Hypertensive chronic kidney disease with stage 5 chronic kidney disease or end stage renal disease: Secondary | ICD-10-CM | POA: Diagnosis not present

## 2017-06-23 DIAGNOSIS — D509 Iron deficiency anemia, unspecified: Secondary | ICD-10-CM | POA: Diagnosis not present

## 2017-06-23 DIAGNOSIS — N2581 Secondary hyperparathyroidism of renal origin: Secondary | ICD-10-CM | POA: Diagnosis not present

## 2017-06-23 DIAGNOSIS — D631 Anemia in chronic kidney disease: Secondary | ICD-10-CM | POA: Diagnosis not present

## 2017-06-23 DIAGNOSIS — Z992 Dependence on renal dialysis: Secondary | ICD-10-CM | POA: Diagnosis not present

## 2017-06-23 DIAGNOSIS — N186 End stage renal disease: Secondary | ICD-10-CM | POA: Diagnosis not present

## 2017-06-25 DIAGNOSIS — D631 Anemia in chronic kidney disease: Secondary | ICD-10-CM | POA: Diagnosis not present

## 2017-06-25 DIAGNOSIS — D509 Iron deficiency anemia, unspecified: Secondary | ICD-10-CM | POA: Diagnosis not present

## 2017-06-25 DIAGNOSIS — N2581 Secondary hyperparathyroidism of renal origin: Secondary | ICD-10-CM | POA: Diagnosis not present

## 2017-06-25 DIAGNOSIS — Z992 Dependence on renal dialysis: Secondary | ICD-10-CM | POA: Diagnosis not present

## 2017-06-25 DIAGNOSIS — N186 End stage renal disease: Secondary | ICD-10-CM | POA: Diagnosis not present

## 2017-06-28 DIAGNOSIS — D509 Iron deficiency anemia, unspecified: Secondary | ICD-10-CM | POA: Diagnosis not present

## 2017-06-28 DIAGNOSIS — D631 Anemia in chronic kidney disease: Secondary | ICD-10-CM | POA: Diagnosis not present

## 2017-06-28 DIAGNOSIS — N2581 Secondary hyperparathyroidism of renal origin: Secondary | ICD-10-CM | POA: Diagnosis not present

## 2017-06-28 DIAGNOSIS — N186 End stage renal disease: Secondary | ICD-10-CM | POA: Diagnosis not present

## 2017-06-28 DIAGNOSIS — Z992 Dependence on renal dialysis: Secondary | ICD-10-CM | POA: Diagnosis not present

## 2017-06-29 DIAGNOSIS — I120 Hypertensive chronic kidney disease with stage 5 chronic kidney disease or end stage renal disease: Secondary | ICD-10-CM | POA: Diagnosis not present

## 2017-06-29 DIAGNOSIS — E1122 Type 2 diabetes mellitus with diabetic chronic kidney disease: Secondary | ICD-10-CM | POA: Diagnosis not present

## 2017-06-29 DIAGNOSIS — N186 End stage renal disease: Secondary | ICD-10-CM | POA: Diagnosis not present

## 2017-06-29 DIAGNOSIS — D631 Anemia in chronic kidney disease: Secondary | ICD-10-CM | POA: Diagnosis not present

## 2017-06-29 DIAGNOSIS — Z4781 Encounter for orthopedic aftercare following surgical amputation: Secondary | ICD-10-CM | POA: Diagnosis not present

## 2017-06-29 DIAGNOSIS — E1151 Type 2 diabetes mellitus with diabetic peripheral angiopathy without gangrene: Secondary | ICD-10-CM | POA: Diagnosis not present

## 2017-06-30 DIAGNOSIS — N2581 Secondary hyperparathyroidism of renal origin: Secondary | ICD-10-CM | POA: Diagnosis not present

## 2017-06-30 DIAGNOSIS — D631 Anemia in chronic kidney disease: Secondary | ICD-10-CM | POA: Diagnosis not present

## 2017-06-30 DIAGNOSIS — Z992 Dependence on renal dialysis: Secondary | ICD-10-CM | POA: Diagnosis not present

## 2017-06-30 DIAGNOSIS — D509 Iron deficiency anemia, unspecified: Secondary | ICD-10-CM | POA: Diagnosis not present

## 2017-06-30 DIAGNOSIS — N186 End stage renal disease: Secondary | ICD-10-CM | POA: Diagnosis not present

## 2017-07-01 DIAGNOSIS — D631 Anemia in chronic kidney disease: Secondary | ICD-10-CM | POA: Diagnosis not present

## 2017-07-01 DIAGNOSIS — I120 Hypertensive chronic kidney disease with stage 5 chronic kidney disease or end stage renal disease: Secondary | ICD-10-CM | POA: Diagnosis not present

## 2017-07-01 DIAGNOSIS — N186 End stage renal disease: Secondary | ICD-10-CM | POA: Diagnosis not present

## 2017-07-01 DIAGNOSIS — Z4781 Encounter for orthopedic aftercare following surgical amputation: Secondary | ICD-10-CM | POA: Diagnosis not present

## 2017-07-01 DIAGNOSIS — E1122 Type 2 diabetes mellitus with diabetic chronic kidney disease: Secondary | ICD-10-CM | POA: Diagnosis not present

## 2017-07-01 DIAGNOSIS — E1151 Type 2 diabetes mellitus with diabetic peripheral angiopathy without gangrene: Secondary | ICD-10-CM | POA: Diagnosis not present

## 2017-07-02 DIAGNOSIS — N2581 Secondary hyperparathyroidism of renal origin: Secondary | ICD-10-CM | POA: Diagnosis not present

## 2017-07-02 DIAGNOSIS — N186 End stage renal disease: Secondary | ICD-10-CM | POA: Diagnosis not present

## 2017-07-02 DIAGNOSIS — D631 Anemia in chronic kidney disease: Secondary | ICD-10-CM | POA: Diagnosis not present

## 2017-07-02 DIAGNOSIS — Z992 Dependence on renal dialysis: Secondary | ICD-10-CM | POA: Diagnosis not present

## 2017-07-02 DIAGNOSIS — D509 Iron deficiency anemia, unspecified: Secondary | ICD-10-CM | POA: Diagnosis not present

## 2017-07-05 DIAGNOSIS — D631 Anemia in chronic kidney disease: Secondary | ICD-10-CM | POA: Diagnosis not present

## 2017-07-05 DIAGNOSIS — N186 End stage renal disease: Secondary | ICD-10-CM | POA: Diagnosis not present

## 2017-07-05 DIAGNOSIS — N2581 Secondary hyperparathyroidism of renal origin: Secondary | ICD-10-CM | POA: Diagnosis not present

## 2017-07-05 DIAGNOSIS — D509 Iron deficiency anemia, unspecified: Secondary | ICD-10-CM | POA: Diagnosis not present

## 2017-07-05 DIAGNOSIS — Z992 Dependence on renal dialysis: Secondary | ICD-10-CM | POA: Diagnosis not present

## 2017-07-06 DIAGNOSIS — D631 Anemia in chronic kidney disease: Secondary | ICD-10-CM | POA: Diagnosis not present

## 2017-07-06 DIAGNOSIS — Z4781 Encounter for orthopedic aftercare following surgical amputation: Secondary | ICD-10-CM | POA: Diagnosis not present

## 2017-07-06 DIAGNOSIS — E1151 Type 2 diabetes mellitus with diabetic peripheral angiopathy without gangrene: Secondary | ICD-10-CM | POA: Diagnosis not present

## 2017-07-06 DIAGNOSIS — I120 Hypertensive chronic kidney disease with stage 5 chronic kidney disease or end stage renal disease: Secondary | ICD-10-CM | POA: Diagnosis not present

## 2017-07-06 DIAGNOSIS — E1122 Type 2 diabetes mellitus with diabetic chronic kidney disease: Secondary | ICD-10-CM | POA: Diagnosis not present

## 2017-07-06 DIAGNOSIS — N186 End stage renal disease: Secondary | ICD-10-CM | POA: Diagnosis not present

## 2017-07-07 DIAGNOSIS — Z992 Dependence on renal dialysis: Secondary | ICD-10-CM | POA: Diagnosis not present

## 2017-07-07 DIAGNOSIS — N2581 Secondary hyperparathyroidism of renal origin: Secondary | ICD-10-CM | POA: Diagnosis not present

## 2017-07-07 DIAGNOSIS — D509 Iron deficiency anemia, unspecified: Secondary | ICD-10-CM | POA: Diagnosis not present

## 2017-07-07 DIAGNOSIS — N186 End stage renal disease: Secondary | ICD-10-CM | POA: Diagnosis not present

## 2017-07-07 DIAGNOSIS — D631 Anemia in chronic kidney disease: Secondary | ICD-10-CM | POA: Diagnosis not present

## 2017-07-08 DIAGNOSIS — I120 Hypertensive chronic kidney disease with stage 5 chronic kidney disease or end stage renal disease: Secondary | ICD-10-CM | POA: Diagnosis not present

## 2017-07-08 DIAGNOSIS — E1151 Type 2 diabetes mellitus with diabetic peripheral angiopathy without gangrene: Secondary | ICD-10-CM | POA: Diagnosis not present

## 2017-07-08 DIAGNOSIS — N186 End stage renal disease: Secondary | ICD-10-CM | POA: Diagnosis not present

## 2017-07-08 DIAGNOSIS — D631 Anemia in chronic kidney disease: Secondary | ICD-10-CM | POA: Diagnosis not present

## 2017-07-08 DIAGNOSIS — E1122 Type 2 diabetes mellitus with diabetic chronic kidney disease: Secondary | ICD-10-CM | POA: Diagnosis not present

## 2017-07-08 DIAGNOSIS — Z4781 Encounter for orthopedic aftercare following surgical amputation: Secondary | ICD-10-CM | POA: Diagnosis not present

## 2017-07-09 DIAGNOSIS — N186 End stage renal disease: Secondary | ICD-10-CM | POA: Diagnosis not present

## 2017-07-09 DIAGNOSIS — N2581 Secondary hyperparathyroidism of renal origin: Secondary | ICD-10-CM | POA: Diagnosis not present

## 2017-07-09 DIAGNOSIS — D509 Iron deficiency anemia, unspecified: Secondary | ICD-10-CM | POA: Diagnosis not present

## 2017-07-09 DIAGNOSIS — Z992 Dependence on renal dialysis: Secondary | ICD-10-CM | POA: Diagnosis not present

## 2017-07-09 DIAGNOSIS — D631 Anemia in chronic kidney disease: Secondary | ICD-10-CM | POA: Diagnosis not present

## 2017-07-14 DIAGNOSIS — N2581 Secondary hyperparathyroidism of renal origin: Secondary | ICD-10-CM | POA: Diagnosis not present

## 2017-07-14 DIAGNOSIS — Z992 Dependence on renal dialysis: Secondary | ICD-10-CM | POA: Diagnosis not present

## 2017-07-14 DIAGNOSIS — D631 Anemia in chronic kidney disease: Secondary | ICD-10-CM | POA: Diagnosis not present

## 2017-07-14 DIAGNOSIS — D509 Iron deficiency anemia, unspecified: Secondary | ICD-10-CM | POA: Diagnosis not present

## 2017-07-14 DIAGNOSIS — N186 End stage renal disease: Secondary | ICD-10-CM | POA: Diagnosis not present

## 2017-07-15 DIAGNOSIS — D631 Anemia in chronic kidney disease: Secondary | ICD-10-CM | POA: Diagnosis not present

## 2017-07-15 DIAGNOSIS — E1151 Type 2 diabetes mellitus with diabetic peripheral angiopathy without gangrene: Secondary | ICD-10-CM | POA: Diagnosis not present

## 2017-07-15 DIAGNOSIS — I120 Hypertensive chronic kidney disease with stage 5 chronic kidney disease or end stage renal disease: Secondary | ICD-10-CM | POA: Diagnosis not present

## 2017-07-15 DIAGNOSIS — Z4781 Encounter for orthopedic aftercare following surgical amputation: Secondary | ICD-10-CM | POA: Diagnosis not present

## 2017-07-15 DIAGNOSIS — N186 End stage renal disease: Secondary | ICD-10-CM | POA: Diagnosis not present

## 2017-07-15 DIAGNOSIS — E1122 Type 2 diabetes mellitus with diabetic chronic kidney disease: Secondary | ICD-10-CM | POA: Diagnosis not present

## 2017-07-16 ENCOUNTER — Encounter (HOSPITAL_COMMUNITY): Payer: Self-pay | Admitting: Emergency Medicine

## 2017-07-16 ENCOUNTER — Emergency Department (HOSPITAL_COMMUNITY)
Admission: EM | Admit: 2017-07-16 | Discharge: 2017-07-16 | Disposition: A | Discharge status: Home / Self Care | Payer: Medicare Other | Attending: Emergency Medicine | Admitting: Emergency Medicine

## 2017-07-16 DIAGNOSIS — Y828 Other medical devices associated with adverse incidents: Secondary | ICD-10-CM | POA: Diagnosis not present

## 2017-07-16 DIAGNOSIS — T8092XA Unspecified transfusion reaction, initial encounter: Secondary | ICD-10-CM | POA: Diagnosis not present

## 2017-07-16 DIAGNOSIS — N186 End stage renal disease: Secondary | ICD-10-CM | POA: Diagnosis not present

## 2017-07-16 DIAGNOSIS — E119 Type 2 diabetes mellitus without complications: Secondary | ICD-10-CM | POA: Diagnosis not present

## 2017-07-16 DIAGNOSIS — I120 Hypertensive chronic kidney disease with stage 5 chronic kidney disease or end stage renal disease: Secondary | ICD-10-CM | POA: Diagnosis not present

## 2017-07-16 DIAGNOSIS — Z7982 Long term (current) use of aspirin: Secondary | ICD-10-CM | POA: Insufficient documentation

## 2017-07-16 DIAGNOSIS — T82838A Hemorrhage of vascular prosthetic devices, implants and grafts, initial encounter: Secondary | ICD-10-CM

## 2017-07-16 DIAGNOSIS — Z79899 Other long term (current) drug therapy: Secondary | ICD-10-CM | POA: Diagnosis not present

## 2017-07-16 DIAGNOSIS — R58 Hemorrhage, not elsewhere classified: Secondary | ICD-10-CM | POA: Diagnosis not present

## 2017-07-16 LAB — BASIC METABOLIC PANEL
ANION GAP: 12 (ref 5–15)
BUN: 57 mg/dL — ABNORMAL HIGH (ref 6–20)
CO2: 26 mmol/L (ref 22–32)
Calcium: 7.9 mg/dL — ABNORMAL LOW (ref 8.9–10.3)
Chloride: 98 mmol/L — ABNORMAL LOW (ref 101–111)
Creatinine, Ser: 9.32 mg/dL — ABNORMAL HIGH (ref 0.61–1.24)
GFR, EST AFRICAN AMERICAN: 6 mL/min — AB (ref >60)
GFR, EST NON AFRICAN AMERICAN: 5 mL/min — AB (ref >60)
Glucose, Bld: 156 mg/dL — ABNORMAL HIGH (ref 65–99)
POTASSIUM: 4.8 mmol/L (ref 3.5–5.1)
SODIUM: 136 mmol/L (ref 135–145)

## 2017-07-16 LAB — CBC
HEMATOCRIT: 38.5 % — AB (ref 39.0–52.0)
HEMOGLOBIN: 11.9 g/dL — AB (ref 13.0–17.0)
MCH: 28.5 pg (ref 26.0–34.0)
MCHC: 30.9 g/dL (ref 30.0–36.0)
MCV: 92.1 fL (ref 78.0–100.0)
Platelets: 176 10*3/uL (ref 150–400)
RBC: 4.18 MIL/uL — ABNORMAL LOW (ref 4.22–5.81)
RDW: 17.8 % — ABNORMAL HIGH (ref 11.5–15.5)
WBC: 4.8 10*3/uL (ref 4.0–10.5)

## 2017-07-16 NOTE — ED Provider Notes (Signed)
Medical screening examination/treatment/procedure(s) were conducted as a shared visit with non-physician practitioner(s) and myself.  I personally evaluated the patient during the encounter.   EKG Interpretation  Date/Time:  Friday July 16 2017 14:04:34 EST Ventricular Rate:  83 PR Interval:    QRS Duration: 130 QT Interval:  486 QTC Calculation: 572 R Axis:   -77 Text Interpretation:  Sinus rhythm Prolonged PR interval Nonspecific IVCD with LAD Anterior infarct, old Nonspecific T abnormalities, lateral leads no acute changes  Confirmed by Brantley Stage [phone]) on 07/16/2017 3:51:98 PM      66 year old male who presents with from dialysis fistula.  Patient had right hero graft placed October 2018 for dialysis.  He went for dialysis today, and was sent to the ED as graft would not perfuse for dialysis.  Access then began to bleed.  Area was clamped off.  Patient sent to ED for evaluation.  Prior to my evaluation patient was seen by vascular surgery.  Clamp was removed and bleeding had stopped.  Patient still has needle, which was removed.  Bleeding stopped with pressure.  Dr. early states that fistula is still okay for dialysis.  He has been able to arrange dialysis for patient tomorrow through [facility].  He will contact patient in 1 week for fistulogram as an outpatient. Will check BMP. If no emergent needs for dialysis will discharge tomorrow for dialysis.   Forde Dandy, MD 07/16/17 [phone]

## 2017-07-16 NOTE — Consult Note (Deleted)
  The note originally documented on this encounter has been moved the the encounter in which it belongs.  

## 2017-07-16 NOTE — ED Triage Notes (Signed)
PT arrives from dialysis with an access issue. PT's right arm was accessed and would not perfuse for dialysis. Access then began to bleed. Area was clamped at dialysis. Area remains clamped. An MD at dialysis clinic spoke to Dr. Cornelia Copa.

## 2017-07-16 NOTE — Discharge Instructions (Signed)
Please return to the ED if you have bleeding from the ER dialysis site again. He should be contacted by the vascular office sometime next weeks to schedule your fistulogram. Please go to her dialysis to appointment tomorrow as planned.

## 2017-07-16 NOTE — Consult Note (Signed)
Hospital Consult    Reason for Consult:  Bleeding R arm AV graft cath site Requesting Physician:  ED MRN #:  [id number]  History of Present Illness: This is a 66 y.o. male with end-stage renal disease on hemodialysis who was seen in consultation for bleeding from cannulization site of right arm hero graft.  Soon after cannulization at dialysis center this morning patient began to bleed heavily from right arm.  After about an hour of conservative measures patient was then sent to the ED for evaluation.  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  Most recently hero graft was placed in right arm by Dr. Donzetta Matters in October 2018.  Patient states that prior to today AV graft has been performing adequately.  Patient is scheduled to return to dialysis tomorrow.  He denies pain or lack of motor function in right hand.  The patient is taking aspirin daily; no other blood thinner use.   Past Medical History:  Diagnosis Date  . Anemia   . Anemia in chronic kidney disease(285.21)   . Anxiety   . Arthritis   . Depression   . Dyspnea    with exertion  . ESRD (end stage renal disease) on dialysis [facility])    "MWF; [facility]" (02/18/2017)  . Essential hypertension   . GERD (gastroesophageal reflux disease)   . Glaucoma   . History of blood transfusion   . Insomnia   . Nonischemic cardiomyopathy ([place])   . Stroke [facility]) 2016   - "they said it was not a stroke"  . Type 2 diabetes mellitus ([place])    Type II    Past Surgical History:  Procedure Laterality Date  . A/V FISTULAGRAM Right 10/06/2016   Procedure: A/V Fistulagram;  Surgeon: [name], MD;  Location: [place] CV LAB;  Service: Cardiovascular;  Laterality: Right;  . A/V FISTULAGRAM Right 04/27/2017   Procedure: A/V Fistulagram;  Surgeon: [name], MD;  Location: [place] CV LAB;  Service: Cardiovascular;   Laterality: Right;  . ABDOMINAL AORTOGRAM N/A 12/30/2016   Procedure: Abdominal Aortogram;  Surgeon: Waynetta Sandy, MD;  Location: [place] CV LAB;  Service: Cardiovascular;  Laterality: N/A;  . ABDOMINAL AORTOGRAM N/A 04/08/2017   Procedure: ABDOMINAL AORTOGRAM;  Surgeon: Conrad Harper, MD;  Location: [place] [facility];  Service: Cardiovascular;  Laterality: N/A;  . ABDOMINAL AORTOGRAM W/LOWER EXTREMITY N/A 02/23/2017   Procedure: Abdominal Aortogram w/Lower Extremity;  Surgeon: [name], MD;  Location: [place] CV LAB;  Service: Cardiovascular;  Laterality: N/A;  Rt. leg  . AMPUTATION Right 02/28/2017   Procedure: RIGHT BELOW KNEE AMPUTATION;  Surgeon: [name], MD;  Location: [place];  Service: Vascular;  Laterality: Right;  . AV FISTULA PLACEMENT     Hx: of  . AV FISTULA PLACEMENT Right 05/04/2013   Procedure: ARTERIOVENOUS (AV) FISTULA CREATION- RIGHT ARM;  Surgeon: Conrad [place]sworth, MD;  Location: [place];  Service: Vascular;  Laterality: Right;  Ultrasound guided  . AV FISTULA PLACEMENT Right 07/28/2016   Procedure: BRACHIOCEPHALIC ARTERIOVENOUS (AV) FISTULA CREATION;  Surgeon: [name], MD;  Location: [place];  Service: Vascular;  Laterality: Right;  . BACK SURGERY    . CATARACT EXTRACTION W/PHACO Left 07/16/2014   Procedure: CATARACT EXTRACTION PHACO AND INTRAOCULAR LENS PLACEMENT (IOC);  Surgeon: [name], MD;  Location: [facility];  Service: Ophthalmology;  Laterality:  Left;  CDE:8.86  . CATARACT EXTRACTION W/PHACO Right 07/30/2014   Procedure: CATARACT EXTRACTION PHACO AND INTRAOCULAR LENS PLACEMENT (IOC);  Surgeon: [name], MD;  Location: [facility];  Service: Ophthalmology;  Laterality: Right;  CDE 8.99  . COLONOSCOPY     Hx: of  . EYE SURGERY    . FISTULA SUPERFICIALIZATION Right 07/28/2016   Procedure: FISTULA SUPERFICIALIZATION;  Surgeon: [name], MD;  Location: [place];  Service: Vascular;  Laterality: Right;  . FISTULA  SUPERFICIALIZATION Right 10/13/2016   Procedure: BRACHIOCEPHALIC ARTERIOVENOUS FISTULA SUPERFICIALIZATION;  Surgeon: [name], MD;  Location: [place];  Service: Vascular;  Laterality: Right;  . FISTULOGRAM N/A 05/04/2013   Procedure: CENTRAL VENOGRAM;  Surgeon: Conrad Amagon, MD;  Location: [place];  Service: Vascular;  Laterality: N/A;  . INSERTION OF DIALYSIS CATHETER Right 05/04/2013   Procedure: INSERTION OF DIALYSIS CATHETER;  Surgeon: Conrad Dodge, MD;  Location: [place];  Service: Vascular;  Laterality: Right;  Ultrasound guided  . INSERTION OF DIALYSIS CATHETER N/A 07/28/2016   Procedure: INSERTION OF DIALYSIS CATHETER;  Surgeon: [name], MD;  Location: [place];  Service: Vascular;  Laterality: N/A;  . IR FLUORO GUIDE CV LINE RIGHT  04/13/2017  . IR US GUIDE VASC ACCESS RIGHT  04/13/2017  . LIGATION OF ARTERIOVENOUS  FISTULA Left 05/04/2013   Procedure: LIGATION OF LEFT RADIAL CEPHALIC ARTERIOVENOUS  FISTULA;  Surgeon: Conrad [place], MD;  Location: [place];  Service: Vascular;  Laterality: Left;  Ultrasound guided  . LIGATION OF ARTERIOVENOUS  FISTULA Right 07/28/2016   Procedure: LIGATION OF RADIOCEPHALIC ARTERIOVENOUS  FISTULA;  Surgeon: [name], MD;  Location: [place];  Service: Vascular;  Laterality: Right;  . LOWER EXTREMITY ANGIOGRAPHY N/A 12/30/2016   Procedure: Lower Extremity Angiography;  Surgeon: Waynetta Sandy, MD;  Location: [place] CV LAB;  Service: Cardiovascular;  Laterality: N/A;  . LUMBAR SPINE SURGERY    . PERIPHERAL VASCULAR ATHERECTOMY Right 12/30/2016   Procedure: Peripheral Vascular Atherectomy;  Surgeon: Waynetta Sandy, MD;  Location: [place] CV LAB;  Service: Cardiovascular;  Laterality: Right;  AT and PT  . PERIPHERAL VASCULAR BALLOON ANGIOPLASTY Right 12/30/2016   Procedure: Peripheral Vascular Balloon Angioplasty;  Surgeon: Waynetta Sandy, MD;  Location: [place] CV [facility];  Service: Cardiovascular;   Laterality: Right;  PTA of PT and AT  . PERIPHERAL VASCULAR BALLOON ANGIOPLASTY  02/23/2017   Procedure: Peripheral Vascular Balloon Angioplasty;  Surgeon: [name], MD;  Location: [place] CV [facility];  Service: Cardiovascular;;  RT. Anterior Tib.  [name] PERIPHERAL VASCULAR BALLOON ANGIOPLASTY  04/08/2017   Procedure: PERIPHERAL VASCULAR BALLOON ANGIOPLASTY;  Surgeon: Conrad Brooks, MD;  Location: [place] CV [facility];  Service: Cardiovascular;;  . PERIPHERAL VASCULAR CATHETERIZATION N/A 01/24/2015   Procedure: Fistulagram;  Surgeon: Conrad Salina, MD;  Location: [place] CV [facility];  Service: Cardiovascular;  Laterality: N/A;  . PERIPHERAL VASCULAR CATHETERIZATION Right 06/04/2016   Procedure: A/V Shuntogram/Fistulagram;  Surgeon: Conrad [place], MD;  Location: [place] [facility];  Service: Cardiovascular;  Laterality: Right;  . REVISON OF ARTERIOVENOUS FISTULA Right 01/02/2014   Procedure: REVISON OF ARTERIOVENOUS FISTULA ANASTOMOSIS;  Surgeon: Conrad , MD;  Location: [place];  Service: Vascular;  Laterality: Right;  . REVISON OF ARTERIOVENOUS FISTULA Right 01/02/2016   Procedure: REVISION OF RADIOCEPHALIC ARTERIOVENOUS FISTULA  with BOVINE PATCH ANGIOPLASTY RIGHT RADIAL ARTERY;  Surgeon: Mal [name], MD;  Location: [place];  Service: Vascular;  Laterality: Right;  . SHUNTOGRAM N/A 11/07/2013   Procedure: Fistulogram;  Surgeon: [name], MD;  Location: [facility] CATH LAB;  Service: Cardiovascular;  Laterality: N/A;  . TRANSMETATARSAL AMPUTATION Right 01/01/2017   Procedure: TRANSMETATARSAL AMPUTATION;  Surgeon: [name], MD;  Location: [place];  Service: Vascular;  Laterality: Right;  [name] VASCULAR ACCESS DEVICE INSERTION Right 05/13/2017   Procedure: INSERTION OF HERO VASCULAR ACCESS DEVICE RIGHT UPPER ARM;  Surgeon: Waynetta Sandy, MD;  Location: [place];  Service: Vascular;  Laterality: Right;  . VENOGRAM N/A 05/13/2017   Procedure: CENTRAL VENOGRAM;  Surgeon: Waynetta Sandy, MD;   Location: [facility] OR;  Service: Vascular;  Laterality: N/A;    Allergies  Allergen Reactions  . Bee Venom Anaphylaxis  . Iodine Swelling  . Penicillins Swelling and Other (See Comments)    SWELLING REACTION UNSPECIFIED   Has patient had a PCN reaction causing immediate rash, facial/tongue/throat swelling, SOB or lightheadedness with hypotension: No Has patient had a PCN reaction causing severe rash involving mucus membranes or skin necrosis: No Has patient had a PCN reaction that required hospitalization No Has patient had a PCN reaction occurring within the last 10 years: No If all of the above answers are "NO", then may proceed with Cephalosporin use.  . Sulfadiazine Other (See Comments)    1% Silver Sulfadiazine cream causes burning over a large area of skin.  [name] Plavix [Clopidogrel] Other (See Comments)    Caused Nose bleed    Prior to Admission medications   Medication Sig Start Date End Date Taking? Authorizing Provider  acetaminophen (TYLENOL) 500 MG tablet Take 500 mg by mouth every 6 (six) hours as needed for mild pain. Do not exceed 4 gms of tylenol in 24 hours    [provider]  aspirin EC 81 MG tablet Take 81 mg by mouth daily.    [provider]  atorvastatin (LIPITOR) 40 MG tablet Take 1 tablet (40 mg total) by mouth daily at 6 PM. 02/26/17   Janece Canterbury, MD  B Complex-C-Folic Acid (DIALYVITE PO) Take 1 tablet by mouth every Monday, Wednesday, and Friday.    [provider]  bisacodyl (DULCOLAX) 5 MG EC tablet Take 2 tablets (10 mg total) by mouth daily as needed for moderate constipation. 01/04/17   Mikhail, Velta Addison, DO  carvedilol (COREG) 12.5 MG tablet Take 1 tablet (12.5 mg total) by mouth See admin instructions. Take 1 tablet (12.5 mg) by mouth twice daily on Sunday, Tuesday, Thursday, Saturday (hold on dialysis days) 04/10/17   Barton Dubois, MD  cinacalcet (SENSIPAR) 30 MG tablet Take 30 mg by mouth daily with breakfast.     [provider]  EPINEPHrine (EPIPEN 2-PAK) 0.3 mg/0.3 mL IJ SOAJ injection Inject 0.3 mg into the muscle once as needed (anaphylaxis).    [provider]  finasteride (PROSCAR) 5 MG tablet Take 1 tablet (5 mg total) by mouth daily. 02/27/17   Janece Canterbury, MD  HYDROcodone-acetaminophen (NORCO/VICODIN) 5-325 MG tablet Take 1 tablet by mouth every 4 (four) hours as needed for moderate pain. Patient taking differently: Take 1 tablet by mouth every 8 (eight) hours as needed for moderate pain.  02/26/17   Janece Canterbury, MD  Hypromellose (ARTIFICIAL TEARS) 0.4 % SOLN Place 1 drop into both eyes every 6 (six) hours as needed (dry eyes).    [provider]  LANTUS SOLOSTAR 100 UNIT/ML Solostar Pen Inject 15 Units into the skin at bedtime. Patient taking differently: Inject 16  Units into the skin at bedtime.  03/02/17   Janece Canterbury, MD  omeprazole (PRILOSEC) 40 MG capsule Take 40 mg by mouth daily.    [provider]  oxyCODONE-acetaminophen (ROXICET) 5-325 MG tablet Take 1-2 tablets by mouth every 6 (six) hours as needed. 05/13/17 05/13/18  Waynetta Sandy, MD  polyethylene glycol powder (GLYCOLAX/MIRALAX) powder Take 17 g by mouth daily. Patient taking differently: Take 17 g by mouth daily. Mix in 8 oz liquid and drink 02/26/17   Janece Canterbury, MD  senna (SENOKOT) 8.6 MG TABS tablet Take 2 tablets (17.2 mg total) by mouth at bedtime. 02/26/17   Janece Canterbury, MD  sevelamer carbonate (RENVELA) 800 MG tablet Take 2,400 mg by mouth 3 (three) times daily with meals.     [provider]    Social History   Socioeconomic History  . Marital status: Divorced    Spouse name: Not on file  . Number of children: Not on file  . Years of education: Not on file  . Highest education level: Not on file  Social Needs  . Financial resource strain: Not on file  . Food insecurity - worry: Not on file  . Food insecurity - inability: Not on file  .  Transportation needs - medical: Not on file  . Transportation needs - non-medical: Not on file  Occupational History  . Not on file  Tobacco Use  . Smoking status: Never Smoker  . Smokeless tobacco: Never Used  Substance and Sexual Activity  . Alcohol use: Yes    Comment: 1- fifth of gin a week  . Drug use: No  . Sexual activity: Yes    Birth control/protection: None  Other Topics Concern  . Not on file  Social History Narrative  . Not on file     Family History  Problem Relation Age of Onset  . Heart attack Brother        11  . Heart disease Brother        before age 44  . Hypertension Brother   . Heart attack Brother        72  . Heart attack Brother        69  . Diabetes Mother   . Hypertension Mother   . Heart disease Mother   . Heart disease Father   . Hypertension Father   . Other Father        amputation  . Hypertension Sister   . Heart disease Sister   . Vision loss Maternal Uncle     ROS: Otherwise negative unless mentioned in HPI  Physical Examination  Vitals:   07/16/17 1359  SpO2: 95%   Body mass index is 33.64 kg/m.  General:  WDWN in NAD Gait: Not observed HENT: WNL, normocephalic Pulmonary: normal non-labored breathing, without Rales, rhonchi,  wheezing Cardiac: regular Abdomen:  soft, NT/ND, no masses Skin: without rashes Vascular Exam/Pulses: Palpable right radial Extremities: Return dialysis needle still in place in right arm; no erosion or large hematoma noted; palpable thrill and audible bruit through right arm graft; no continued bleeding when pressure dressing removed Musculoskeletal: no muscle wasting or atrophy  Neurologic: A&O X 3;  No focal weakness or paresthesias are detected; speech is fluent/normal Psychiatric:  The pt has Normal affect. Lymph:  Unremarkable  CBC    Component Value Date/Time   WBC 5.3 05/13/2017 0939   RBC 3.78 (L) 05/13/2017 0939   HGB 11.9 (L) 05/13/2017 0942   HCT 35.0 (L)  05/13/2017 0942    PLT 237 05/13/2017 0939   MCV 89.9 05/13/2017 0939   MCH 26.2 05/13/2017 0939   MCHC 29.1 (L) 05/13/2017 0939   RDW 18.3 (H) 05/13/2017 0939   LYMPHSABS 0.3 (L) 04/09/2017 0101   MONOABS 0.3 04/09/2017 0101   EOSABS 0.0 04/09/2017 0101   BASOSABS 0.0 04/09/2017 0101    BMET    Component Value Date/Time   NA 140 05/13/2017 0942   K 4.0 05/13/2017 0942   CL 95 (L) 04/27/2017 0638   CO2 22 04/13/2017 1315   GLUCOSE 79 05/13/2017 0942   BUN 40 (H) 04/27/2017 0638   CREATININE 5.50 (H) 04/27/2017 0638   CALCIUM 8.5 (L) 04/13/2017 1315   GFRNONAA 3 (L) 04/13/2017 1315   GFRAA 3 (L) 04/13/2017 1315    COAGS: Lab Results  Component Value Date   INR 1.35 05/13/2017   INR 1.53 04/13/2017   INR 1.71 04/09/2017      Statin:  Yes.   Beta Blocker:  Yes.   Aspirin:  Yes.   ACEI:  No. ARB:  No. CCB use:  No Other antiplatelets/anticoagulants:  No.    ASSESSMENT/PLAN: This is a 66 y.o. male with prolonged bleeding from cannulization site of right arm AV dialysis graft  -This patient was evaluated alongside Dr. Donnetta Hutching - No bleeding noted when pressure dressing was removed during exam - Remaining dialysis needle will be removed and pressure will be applied; if no bleeding is noted after a period of observation patient may be discharged from ED from vascular surgery standpoint - Okay to dialyze patient from hero graft tomorrow as scheduled -We will arrange a fistulogram to be performed by Dr. Donzetta Matters as an outpatient in the next week or so to assess the hemodynamics of right arm hero graft  Dagoberto Ligas PA-C [facility] [phone]

## 2017-07-16 NOTE — ED Notes (Signed)
Report accepted by oncoming nurse

## 2017-07-16 NOTE — ED Provider Notes (Signed)
[facility] EMERGENCY DEPARTMENT Provider Note   CSN: [id number] Arrival date & time: 07/16/17  1359     History   Chief Complaint Chief Complaint  Patient presents with  . Vascular Access Problem    HPI  Jacob [name] is a [name] with a history of ESRD on HD MWF, cardiomyopathy, GERD, hypertension, diabetes, who presents via EMS from dialysis after he had bleeding from his right arm dialysis fistula. Patient was not able to complete dialysis today. Upper needle was left in place and patient was transported to the hospital. Patient denies any chest pain, shortness of breath, lightheadedness, dizziness. Patient denies pain, numbness or tingling in the right arm, denies motor dysfunction. Dr. Cornelia Copa with vascular surgery was contacted by dialysis and met patient in the ED, on arrival bleeding is controlled, upper needle needs to be removed still. Patient takes daily baby aspirin, no other blood thinners.      Past Medical History:  Diagnosis Date  . Anemia   . Anemia in chronic kidney disease(285.21)   . Anxiety   . Arthritis   . Depression   . Dyspnea    with exertion  . ESRD (end stage renal disease) on dialysis [facility])    "MWF; [facility] [place]" (02/18/2017)  . Essential hypertension   . GERD (gastroesophageal reflux disease)   . Glaucoma   . History of blood transfusion   . Insomnia   . Nonischemic cardiomyopathy ([place])   . Stroke [facility]) 2016   - "they said it was not a stroke"  . Type 2 diabetes mellitus ([place])    Type II    Patient Active Problem List   Diagnosis Date Noted  . Hypothyroidism   . Bradycardia 04/09/2017  . Hematoma of groin   . Swelling of arm 03/18/2017  . PAD (peripheral artery disease) ([place])   . Acute blood loss anemia   . Anemia of chronic disease   . DNR (do not resuscitate) 02/24/2017  . Palliative care by specialist 02/24/2017  . Normocytic anemia 02/18/2017  . Hypokalemia 12/28/2016  . Insulin-requiring or dependent  type II diabetes mellitus ([place]) 12/28/2016  . Gangrene of foot ([place]) 12/28/2016  . Gangrene of right foot ([place]) 12/28/2016  . Hyperkalemia 04/05/2016  . Inadequate dialysis 04/05/2016  . Other complications due to renal dialysis device, implant, and graft 04/28/2013  . Subclavian vein occlusion (H[facility]) 04/28/2013  . Preoperative cardiovascular examination 12/28/2012  . Mixed hyperlipidemia 06/16/2011  . Nonischemic cardiomyopathy ([place]) 11/20/2010  . End-stage renal disease on hemodialysis ([place]) 11/20/2010  . Essential hypertension, benign 11/20/2010    Past Surgical History:  Procedure Laterality Date  . A/V FISTULAGRAM Right 10/06/2016   Procedure: A/V Fistulagram;  Surgeon: [name], MD;  Location: [place] [facility];  Service: Cardiovascular;  Laterality: Right;  . A/V FISTULAGRAM Right 04/27/2017   Procedure: A/V Fistulagram;  Surgeon: [name], MD;  Location: [place] CV [facility];  Service: Cardiovascular;  Laterality: Right;  . ABDOMINAL AORTOGRAM N/A 12/30/2016   Procedure: Abdominal Aortogram;  Surgeon: Waynetta Sandy, MD;  Location: [place] CV [facility];  Service: Cardiovascular;  Laterality: N/A;  . ABDOMINAL AORTOGRAM N/A 04/08/2017   Procedure: ABDOMINAL AORTOGRAM;  Surgeon: Conrad , MD;  Location: [place] CV [facility];  Service: Cardiovascular;  Laterality: N/A;  . ABDOMINAL AORTOGRAM W/LOWER EXTREMITY N/A 02/23/2017   Procedure: Abdominal Aortogram w/Lower Extremity;  Surgeon: [name], MD;  Location: [place] CV [facility];  Service:  Cardiovascular;  Laterality: N/A;  Rt. leg  . AMPUTATION Right 02/28/2017   Procedure: RIGHT BELOW KNEE AMPUTATION;  Surgeon: [name], MD;  Location: [place];  Service: Vascular;  Laterality: Right;  . AV FISTULA PLACEMENT     Hx: of  . AV FISTULA PLACEMENT Right 05/04/2013   Procedure: ARTERIOVENOUS (AV) FISTULA CREATION- RIGHT ARM;  Surgeon: Conrad [place], MD;  Location: [place];  Service: Vascular;  Laterality: Right;   Ultrasound guided  . AV FISTULA PLACEMENT Right 07/28/2016   Procedure: BRACHIOCEPHALIC ARTERIOVENOUS (AV) FISTULA CREATION;  Surgeon: [name], MD;  Location: [place];  Service: Vascular;  Laterality: Right;  . BACK SURGERY    . CATARACT EXTRACTION W/PHACO Left 07/16/2014   Procedure: CATARACT EXTRACTION PHACO AND INTRAOCULAR LENS PLACEMENT (IOC);  Surgeon: [name], MD;  Location: [facility];  Service: Ophthalmology;  Laterality: Left;  CDE:8.86  . CATARACT EXTRACTION W/PHACO Right 07/30/2014   Procedure: CATARACT EXTRACTION PHACO AND INTRAOCULAR LENS PLACEMENT (IOC);  Surgeon: [name], MD;  Location: [facility];  Service: Ophthalmology;  Laterality: Right;  CDE 8.99  . COLONOSCOPY     Hx: of  . EYE SURGERY    . FISTULA SUPERFICIALIZATION Right 07/28/2016   Procedure: FISTULA SUPERFICIALIZATION;  Surgeon: [name], MD;  Location: [place];  Service: Vascular;  Laterality: Right;  . FISTULA SUPERFICIALIZATION Right 10/13/2016   Procedure: BRACHIOCEPHALIC ARTERIOVENOUS FISTULA SUPERFICIALIZATION;  Surgeon: [name], MD;  Location: [place];  Service: Vascular;  Laterality: Right;  . FISTULOGRAM N/A 05/04/2013   Procedure: CENTRAL VENOGRAM;  Surgeon: Conrad [place], MD;  Location: [place];  Service: Vascular;  Laterality: N/A;  . INSERTION OF DIALYSIS CATHETER Right 05/04/2013   Procedure: INSERTION OF DIALYSIS CATHETER;  Surgeon: Conrad Sobieski, MD;  Location: [place];  Service: Vascular;  Laterality: Right;  Ultrasound guided  . INSERTION OF DIALYSIS CATHETER N/A 07/28/2016   Procedure: INSERTION OF DIALYSIS CATHETER;  Surgeon: [name], MD;  Location: [place];  Service: Vascular;  Laterality: N/A;  . IR FLUORO GUIDE CV LINE RIGHT  04/13/2017  . IR US GUIDE VASC ACCESS RIGHT  04/13/2017  . LIGATION OF ARTERIOVENOUS  FISTULA Left 05/04/2013   Procedure: LIGATION OF LEFT RADIAL CEPHALIC ARTERIOVENOUS  FISTULA;  Surgeon: Conrad Coaling, MD;  Location: [place];  Service:  Vascular;  Laterality: Left;  Ultrasound guided  . LIGATION OF ARTERIOVENOUS  FISTULA Right 07/28/2016   Procedure: LIGATION OF RADIOCEPHALIC ARTERIOVENOUS  FISTULA;  Surgeon: [name], MD;  Location: [place];  Service: Vascular;  Laterality: Right;  . LOWER EXTREMITY ANGIOGRAPHY N/A 12/30/2016   Procedure: Lower Extremity Angiography;  Surgeon: Waynetta Sandy, MD;  Location: [place] [facility];  Service: Cardiovascular;  Laterality: N/A;  . LUMBAR SPINE SURGERY    . PERIPHERAL VASCULAR ATHERECTOMY Right 12/30/2016   Procedure: Peripheral Vascular Atherectomy;  Surgeon: Waynetta Sandy, MD;  Location: [place] [facility];  Service: Cardiovascular;  Laterality: Right;  AT and PT  . PERIPHERAL VASCULAR BALLOON ANGIOPLASTY Right 12/30/2016   Procedure: Peripheral Vascular Balloon Angioplasty;  Surgeon: Waynetta Sandy, MD;  Location: [place] [facility];  Service: Cardiovascular;  Laterality: Right;  PTA of PT and AT  . PERIPHERAL VASCULAR BALLOON ANGIOPLASTY  02/23/2017   Procedure: Peripheral Vascular Balloon Angioplasty;  Surgeon: [name], MD;  Location: [place] [facility] LAB;  Service: Cardiovascular;;  RT. Anterior Tib.  [name] PERIPHERAL VASCULAR BALLOON ANGIOPLASTY  04/08/2017  Procedure: PERIPHERAL VASCULAR BALLOON ANGIOPLASTY;  Surgeon: Conrad Bushnell, MD;  Location: [place] [facility];  Service: Cardiovascular;;  . PERIPHERAL VASCULAR CATHETERIZATION N/A 01/24/2015   Procedure: Fistulagram;  Surgeon: Conrad [place], MD;  Location: [place] [facility];  Service: Cardiovascular;  Laterality: N/A;  . PERIPHERAL VASCULAR CATHETERIZATION Right 06/04/2016   Procedure: A/V Shuntogram/Fistulagram;  Surgeon: Conrad Blanchard, MD;  Location: [place] [facility];  Service: Cardiovascular;  Laterality: Right;  . REVISON OF ARTERIOVENOUS FISTULA Right 01/02/2014   Procedure: REVISON OF ARTERIOVENOUS FISTULA ANASTOMOSIS;  Surgeon: Conrad , MD;  Location: [facility];  Service:  Vascular;  Laterality: Right;  . REVISON OF ARTERIOVENOUS FISTULA Right 01/02/2016   Procedure: REVISION OF RADIOCEPHALIC ARTERIOVENOUS FISTULA  with BOVINE PATCH ANGIOPLASTY RIGHT RADIAL ARTERY;  Surgeon: Mal [name], MD;  Location: [place];  Service: Vascular;  Laterality: Right;  . SHUNTOGRAM N/A 11/07/2013   Procedure: Fistulogram;  Surgeon: [name], MD;  Location: [facility] C[facility];  Service: Cardiovascular;  Laterality: N/A;  . TRANSMETATARSAL AMPUTATION Right 01/01/2017   Procedure: TRANSMETATARSAL AMPUTATION;  Surgeon: [name], MD;  Location: [place];  Service: Vascular;  Laterality: Right;  [name] VASCULAR ACCESS DEVICE INSERTION Right 05/13/2017   Procedure: INSERTION OF HERO VASCULAR ACCESS DEVICE RIGHT UPPER ARM;  Surgeon: Waynetta Sandy, MD;  Location: [place];  Service: Vascular;  Laterality: Right;  . VENOGRAM N/A 05/13/2017   Procedure: CENTRAL VENOGRAM;  Surgeon: Waynetta Sandy, MD;  Location: [place];  Service: Vascular;  Laterality: N/A;       Home Medications    Prior to Admission medications   Medication Sig Start Date End Date Taking? Authorizing Provider  acetaminophen (TYLENOL) 500 MG tablet Take 500 mg by mouth every 6 (six) hours as needed for mild pain. Do not exceed 4 gms of tylenol in 24 hours    [provider]  aspirin EC 81 MG tablet Take 81 mg by mouth daily.    [provider]  atorvastatin (LIPITOR) 40 MG tablet Take 1 tablet (40 mg total) by mouth daily at 6 PM. 02/26/17   Janece Canterbury, MD  B Complex-C-Folic Acid (DIALYVITE PO) Take 1 tablet by mouth every Monday, Wednesday, and Friday.    [provider]  bisacodyl (DULCOLAX) 5 MG EC tablet Take 2 tablets (10 mg total) by mouth daily as needed for moderate constipation. 01/04/17   Mikhail, Velta Addison, DO  carvedilol (COREG) 12.5 MG tablet Take 1 tablet (12.5 mg total) by mouth See admin instructions. Take 1 tablet (12.5 mg) by mouth twice daily on Sunday, Tuesday,  Thursday, Saturday (hold on dialysis days) 04/10/17   Barton Dubois, MD  cinacalcet (SENSIPAR) 30 MG tablet Take 30 mg by mouth daily with breakfast.     [provider]  EPINEPHrine (EPIPEN 2-PAK) 0.3 mg/0.3 mL IJ SOAJ injection Inject 0.3 mg into the muscle once as needed (anaphylaxis).    [provider]  finasteride (PROSCAR) 5 MG tablet Take 1 tablet (5 mg total) by mouth daily. 02/27/17   Janece Canterbury, MD  HYDROcodone-acetaminophen (NORCO/VICODIN) 5-325 MG tablet Take 1 tablet by mouth every 4 (four) hours as needed for moderate pain. Patient taking differently: Take 1 tablet by mouth every 8 (eight) hours as needed for moderate pain.  02/26/17   Janece Canterbury, MD  Hypromellose (ARTIFICIAL TEARS) 0.4 % SOLN Place 1 drop into both eyes every 6 (six) hours as needed (dry eyes).    [provider]  LANTUS SOLOSTAR 100 UNIT/ML Solostar Pen Inject 15 Units into the skin at bedtime. Patient taking differently: Inject 16 Units into the skin at bedtime.  03/02/17   Janece Canterbury, MD  omeprazole (PRILOSEC) 40 MG capsule Take 40 mg by mouth daily.    [provider]  oxyCODONE-acetaminophen (ROXICET) 5-325 MG tablet Take 1-2 tablets by mouth every 6 (six) hours as needed. 05/13/17 05/13/18  Waynetta Sandy, MD  polyethylene glycol powder (GLYCOLAX/MIRALAX) powder Take 17 g by mouth daily. Patient taking differently: Take 17 g by mouth daily. Mix in 8 oz liquid and drink 02/26/17   Janece Canterbury, MD  senna (SENOKOT) 8.6 MG TABS tablet Take 2 tablets (17.2 mg total) by mouth at bedtime. 02/26/17   Janece Canterbury, MD  sevelamer carbonate (RENVELA) 800 MG tablet Take 2,400 mg by mouth 3 (three) times daily with meals.     [provider]    Family History Family History  Problem Relation Age of Onset  . Heart attack Brother        33  . Heart disease Brother        before age 68  . Hypertension Brother   . Heart attack Brother         93  . Heart attack Brother        86  . Diabetes Mother   . Hypertension Mother   . Heart disease Mother   . Heart disease Father   . Hypertension Father   . Other Father        amputation  . Hypertension Sister   . Heart disease Sister   . Vision loss Maternal Uncle     Social History Social History   Tobacco Use  . Smoking status: Never Smoker  . Smokeless tobacco: Never Used  Substance Use Topics  . Alcohol use: Yes    Comment: 1- fifth of gin a week  . Drug use: No     Allergies   Bee venom; Iodine; Penicillins; Sulfadiazine; and Plavix [clopidogrel]   Review of Systems Review of Systems  Constitutional: Negative for chills and fever.  Respiratory: Negative for chest tightness and shortness of breath.   Cardiovascular: Negative for chest pain and palpitations.  Gastrointestinal: Negative for abdominal pain, nausea and vomiting.  Genitourinary: Negative for dysuria.  Musculoskeletal: Negative for arthralgias and myalgias.  Skin: Negative for pallor.  Neurological: Negative for dizziness, syncope and light-headedness.  All other systems reviewed and are negative.    Physical Exam Updated Vital Signs BP (!) 190/70   Pulse 67   Resp 19   Ht _0  (1.88 m)   Wt 118.8 kg (262 lb)   SpO2 99%   BMI 33.64 kg/m   Physical Exam  Constitutional: He appears well-developed and well-nourished. No distress.  HENT:  Head: Normocephalic and atraumatic.  Eyes: Right eye exhibits no discharge. Left eye exhibits no discharge.  Cardiovascular: Normal rate, regular rhythm and normal heart sounds.  Pulmonary/Chest: Effort normal and breath sounds normal. No respiratory distress.  Abdominal: Soft. Bowel sounds are normal. He exhibits no distension. There is no tenderness.  Musculoskeletal:  No evident bleeding from lower vascular access site on arrival, upper access needle is still in place, with no bleeding around it, palpable thrill over the fistula, normal grip  strength and sensation intact distally  Neurological: He is alert. Coordination normal.  Skin: He is not diaphoretic.  Psychiatric: He has a normal mood and affect. His behavior is normal.  Nursing note and vitals reviewed.    ED Treatments / Results  Labs (all labs ordered are listed, but only abnormal results are displayed) Labs Reviewed  BASIC METABOLIC PANEL - Abnormal; Notable for the following components:      Result Value   Chloride 98 (*)    Glucose, Bld 156 (*)    BUN 57 (*)    Creatinine, Ser 9.32 (*)    Calcium 7.9 (*)    GFR calc non Af Amer 5 (*)    GFR calc Af Amer 6 (*)    All other components within normal limits  CBC - Abnormal; Notable for the following components:   RBC 4.18 (*)    Hemoglobin 11.9 (*)    HCT 38.5 (*)    RDW 17.8 (*)    All other components within normal limits    EKG  EKG Interpretation  Date/Time:  Friday July 16 2017 14:04:34 EST Ventricular Rate:  83 PR Interval:    QRS Duration: 130 QT Interval:  486 QTC Calculation: 572 R Axis:   -77 Text Interpretation:  Sinus rhythm Prolonged PR interval Nonspecific IVCD with LAD Anterior infarct, old Nonspecific T abnormalities, lateral leads no acute changes  Confirmed by Brantley Stage [id number]) on 07/16/2017 3:51:14 PM       Radiology No results found.  Procedures Procedures (including critical care time)  Medications Ordered in ED Medications - No data to display   Initial Impression / Assessment and Plan / ED Course  I have reviewed the triage vital signs and the nursing notes.  Pertinent labs & imaging results that were available during my care of the patient were reviewed by me and considered in my medical decision making (see chart for details).  Patient presents with bleeding from lower vascular asked Theresia Lo site from dialysis. On arrival to the ED bleeding has stopped. Patient met at the door by Dr. Cornelia Copa with vascular surgery, who reports patient just needs to have the  upper access needle removed. Dr. Cornelia Copa plans to do fistulogram next week, and patient has dialysis scheduled for noon tomorrow since he was unable to complete it today.   Vitals are normal and patient is in no acute distress. Vascular access needle removed, pressure dressing with quick clot applied, patient reevaluated 15 minutes later, no bleeding from the access site, light gauze dressing applied. Labs are unremarkable, hemoglobin is stable and potassium is within normal limits. Patient is stable for discharge home, with dialysis appointment tomorrow at noon. At discharge patient is in no acute distress, return precautions discussed, patient expresses understanding and is in agreement with plan.  Final Clinical Impressions(s) / ED Diagnoses   Final diagnoses:  Bleeding from dialysis shunt, initial encounter [facility])    ED Discharge Orders    None       Jacqlyn Larsen, [place] 07/16/17 1656    Forde Dandy, MD 07/19/17 [phone]

## 2017-07-17 ENCOUNTER — Other Ambulatory Visit: Payer: Self-pay

## 2017-07-17 ENCOUNTER — Observation Stay (HOSPITAL_COMMUNITY)
Admission: EM | Admit: 2017-07-17 | Discharge: 2017-07-18 | Disposition: A | Discharge status: Home / Self Care | Payer: Medicare Other | Attending: Vascular Surgery | Admitting: Vascular Surgery

## 2017-07-17 ENCOUNTER — Encounter (HOSPITAL_COMMUNITY): Payer: Self-pay

## 2017-07-17 DIAGNOSIS — G4700 Insomnia, unspecified: Secondary | ICD-10-CM | POA: Insufficient documentation

## 2017-07-17 DIAGNOSIS — Z8673 Personal history of transient ischemic attack (TIA), and cerebral infarction without residual deficits: Secondary | ICD-10-CM | POA: Diagnosis not present

## 2017-07-17 DIAGNOSIS — R001 Bradycardia, unspecified: Secondary | ICD-10-CM | POA: Diagnosis not present

## 2017-07-17 DIAGNOSIS — E1152 Type 2 diabetes mellitus with diabetic peripheral angiopathy with gangrene: Secondary | ICD-10-CM | POA: Insufficient documentation

## 2017-07-17 DIAGNOSIS — I428 Other cardiomyopathies: Secondary | ICD-10-CM | POA: Insufficient documentation

## 2017-07-17 DIAGNOSIS — N186 End stage renal disease: Secondary | ICD-10-CM | POA: Diagnosis not present

## 2017-07-17 DIAGNOSIS — Z880 Allergy status to penicillin: Secondary | ICD-10-CM | POA: Insufficient documentation

## 2017-07-17 DIAGNOSIS — Y939 Activity, unspecified: Secondary | ICD-10-CM | POA: Insufficient documentation

## 2017-07-17 DIAGNOSIS — E876 Hypokalemia: Secondary | ICD-10-CM | POA: Diagnosis not present

## 2017-07-17 DIAGNOSIS — Z9841 Cataract extraction status, right eye: Secondary | ICD-10-CM | POA: Insufficient documentation

## 2017-07-17 DIAGNOSIS — Z91041 Radiographic dye allergy status: Secondary | ICD-10-CM | POA: Insufficient documentation

## 2017-07-17 DIAGNOSIS — I96 Gangrene, not elsewhere classified: Secondary | ICD-10-CM | POA: Insufficient documentation

## 2017-07-17 DIAGNOSIS — Z888 Allergy status to other drugs, medicaments and biological substances status: Secondary | ICD-10-CM | POA: Insufficient documentation

## 2017-07-17 DIAGNOSIS — X58XXXA Exposure to other specified factors, initial encounter: Secondary | ICD-10-CM | POA: Insufficient documentation

## 2017-07-17 DIAGNOSIS — Z794 Long term (current) use of insulin: Secondary | ICD-10-CM | POA: Insufficient documentation

## 2017-07-17 DIAGNOSIS — Z7982 Long term (current) use of aspirin: Secondary | ICD-10-CM | POA: Diagnosis not present

## 2017-07-17 DIAGNOSIS — M1990 Unspecified osteoarthritis, unspecified site: Secondary | ICD-10-CM | POA: Insufficient documentation

## 2017-07-17 DIAGNOSIS — S301XXA Contusion of abdominal wall, initial encounter: Secondary | ICD-10-CM | POA: Insufficient documentation

## 2017-07-17 DIAGNOSIS — Z9842 Cataract extraction status, left eye: Secondary | ICD-10-CM | POA: Insufficient documentation

## 2017-07-17 DIAGNOSIS — Z992 Dependence on renal dialysis: Secondary | ICD-10-CM | POA: Diagnosis not present

## 2017-07-17 DIAGNOSIS — T82868A Thrombosis of vascular prosthetic devices, implants and grafts, initial encounter: Secondary | ICD-10-CM | POA: Diagnosis not present

## 2017-07-17 DIAGNOSIS — D62 Acute posthemorrhagic anemia: Secondary | ICD-10-CM | POA: Insufficient documentation

## 2017-07-17 DIAGNOSIS — Z91030 Bee allergy status: Secondary | ICD-10-CM | POA: Insufficient documentation

## 2017-07-17 DIAGNOSIS — E039 Hypothyroidism, unspecified: Secondary | ICD-10-CM | POA: Diagnosis not present

## 2017-07-17 DIAGNOSIS — D631 Anemia in chronic kidney disease: Secondary | ICD-10-CM | POA: Diagnosis not present

## 2017-07-17 DIAGNOSIS — Z8249 Family history of ischemic heart disease and other diseases of the circulatory system: Secondary | ICD-10-CM | POA: Insufficient documentation

## 2017-07-17 DIAGNOSIS — Z89511 Acquired absence of right leg below knee: Secondary | ICD-10-CM | POA: Insufficient documentation

## 2017-07-17 DIAGNOSIS — E782 Mixed hyperlipidemia: Secondary | ICD-10-CM | POA: Diagnosis not present

## 2017-07-17 DIAGNOSIS — Z833 Family history of diabetes mellitus: Secondary | ICD-10-CM | POA: Insufficient documentation

## 2017-07-17 DIAGNOSIS — Z882 Allergy status to sulfonamides status: Secondary | ICD-10-CM | POA: Insufficient documentation

## 2017-07-17 DIAGNOSIS — E875 Hyperkalemia: Secondary | ICD-10-CM | POA: Diagnosis not present

## 2017-07-17 DIAGNOSIS — Z89431 Acquired absence of right foot: Secondary | ICD-10-CM | POA: Insufficient documentation

## 2017-07-17 DIAGNOSIS — I120 Hypertensive chronic kidney disease with stage 5 chronic kidney disease or end stage renal disease: Secondary | ICD-10-CM | POA: Insufficient documentation

## 2017-07-17 DIAGNOSIS — F418 Other specified anxiety disorders: Secondary | ICD-10-CM | POA: Insufficient documentation

## 2017-07-17 DIAGNOSIS — F419 Anxiety disorder, unspecified: Secondary | ICD-10-CM | POA: Insufficient documentation

## 2017-07-17 DIAGNOSIS — K219 Gastro-esophageal reflux disease without esophagitis: Secondary | ICD-10-CM | POA: Diagnosis not present

## 2017-07-17 DIAGNOSIS — Z820 Family history of epilepsy and other diseases of the nervous system: Secondary | ICD-10-CM | POA: Insufficient documentation

## 2017-07-17 DIAGNOSIS — R0602 Shortness of breath: Secondary | ICD-10-CM | POA: Insufficient documentation

## 2017-07-17 DIAGNOSIS — E1122 Type 2 diabetes mellitus with diabetic chronic kidney disease: Secondary | ICD-10-CM | POA: Diagnosis not present

## 2017-07-17 DIAGNOSIS — Z79899 Other long term (current) drug therapy: Secondary | ICD-10-CM | POA: Insufficient documentation

## 2017-07-17 DIAGNOSIS — H409 Unspecified glaucoma: Secondary | ICD-10-CM | POA: Diagnosis not present

## 2017-07-17 DIAGNOSIS — F329 Major depressive disorder, single episode, unspecified: Secondary | ICD-10-CM | POA: Diagnosis not present

## 2017-07-17 LAB — CBC
HCT: 35.6 % — ABNORMAL LOW (ref 39.0–52.0)
HCT: 35.7 % — ABNORMAL LOW (ref 39.0–52.0)
Hemoglobin: 11 g/dL — ABNORMAL LOW (ref 13.0–17.0)
Hemoglobin: 11.2 g/dL — ABNORMAL LOW (ref 13.0–17.0)
MCH: 27.9 pg (ref 26.0–34.0)
MCH: 28.4 pg (ref 26.0–34.0)
MCHC: 30.9 g/dL (ref 30.0–36.0)
MCHC: 31.4 g/dL (ref 30.0–36.0)
MCV: 90.4 fL (ref 78.0–100.0)
MCV: 90.6 fL (ref 78.0–100.0)
PLATELETS: 213 10*3/uL (ref 150–400)
PLATELETS: 190 10*3/uL (ref 150–400)
RBC: 3.94 MIL/uL — ABNORMAL LOW (ref 4.22–5.81)
RBC: 3.94 MIL/uL — ABNORMAL LOW (ref 4.22–5.81)
RDW: 17.5 % — AB (ref 11.5–15.5)
RDW: 17.3 % — AB (ref 11.5–15.5)
WBC: 5.4 10*3/uL (ref 4.0–10.5)
WBC: 5.2 10*3/uL (ref 4.0–10.5)

## 2017-07-17 LAB — BASIC METABOLIC PANEL
Anion gap: 14 (ref 5–15)
BUN: 67 mg/dL — AB (ref 6–20)
CO2: 21 mmol/L — AB (ref 22–32)
CREATININE: 10.56 mg/dL — AB (ref 0.61–1.24)
Calcium: 7.4 mg/dL — ABNORMAL LOW (ref 8.9–10.3)
Chloride: 100 mmol/L — ABNORMAL LOW (ref 101–111)
GFR calc Af Amer: 5 mL/min — ABNORMAL LOW (ref >60)
GFR calc non Af Amer: 4 mL/min — ABNORMAL LOW (ref >60)
GLUCOSE: 139 mg/dL — AB (ref 65–99)
POTASSIUM: 5 mmol/L (ref 3.5–5.1)
Sodium: 135 mmol/L (ref 135–145)

## 2017-07-17 LAB — GLUCOSE, CAPILLARY: Glucose-Capillary: 204 mg/dL — ABNORMAL HIGH (ref 65–99)

## 2017-07-17 MED ORDER — FINASTERIDE 5 MG PO TABS: 5.0000 mg | ORAL_TABLET | Freq: Every day | ORAL | Status: DC

## 2017-07-17 MED ORDER — POLYETHYLENE GLYCOL 3350 17 G PO PACK: 17.0000 g | PACK | Freq: Every day | ORAL | Status: DC

## 2017-07-17 MED ORDER — METOPROLOL TARTRATE 5 MG/5ML IV SOLN
2.0000 mg | INTRAVENOUS | Status: DC | PRN
Start: 2017-07-17 — End: 2017-07-18

## 2017-07-17 MED ORDER — PHENOL 1.4 % MT LIQD: 1.0000 | OROMUCOSAL | Status: DC | PRN

## 2017-07-17 MED ORDER — PANTOPRAZOLE SODIUM 40 MG PO TBEC: 40.0000 mg | DELAYED_RELEASE_TABLET | Freq: Every day | ORAL | Status: DC

## 2017-07-17 MED ORDER — BISACODYL 5 MG PO TBEC: 10.0000 mg | DELAYED_RELEASE_TABLET | Freq: Every day | ORAL | Status: DC | PRN

## 2017-07-17 MED ORDER — SEVELAMER CARBONATE 800 MG PO TABS: 2400.0000 mg | ORAL_TABLET | Freq: Three times a day (TID) | ORAL | Status: DC

## 2017-07-17 MED ORDER — SENNA 8.6 MG PO TABS
2.0000 | ORAL_TABLET | Freq: Every day | ORAL | Status: DC
  Administered 2017-07-17: 17.2 mg via ORAL
  Filled 2017-07-17: qty 2

## 2017-07-17 MED ORDER — HYDRALAZINE HCL 20 MG/ML IJ SOLN
5.0000 mg | INTRAMUSCULAR | Status: DC | PRN
  Administered 2017-07-17: 5 mg via INTRAVENOUS
  Filled 2017-07-17: qty 1

## 2017-07-17 MED ORDER — ATORVASTATIN CALCIUM 40 MG PO TABS: 40.0000 mg | ORAL_TABLET | Freq: Every day | ORAL | Status: DC

## 2017-07-17 MED ORDER — OXYCODONE-ACETAMINOPHEN 5-325 MG PO TABS
1.0000 | ORAL_TABLET | Freq: Four times a day (QID) | ORAL | Status: DC | PRN
  Administered 2017-07-17: 2 via ORAL
  Filled 2017-07-17: qty 2

## 2017-07-17 MED ORDER — INSULIN GLARGINE 100 UNIT/ML ~~LOC~~ SOLN
15.0000 [IU] | Freq: Every day | SUBCUTANEOUS | Status: DC
  Administered 2017-07-17: 15 [IU] via SUBCUTANEOUS
  Filled 2017-07-17: qty 0.15

## 2017-07-17 MED ORDER — CARVEDILOL 12.5 MG PO TABS: 12.5000 mg | ORAL_TABLET | ORAL | Status: DC

## 2017-07-17 MED ORDER — LABETALOL HCL 5 MG/ML IV SOLN: 10.0000 mg | INTRAVENOUS | Status: DC | PRN

## 2017-07-17 MED ORDER — ASPIRIN EC 81 MG PO TBEC: 81.0000 mg | DELAYED_RELEASE_TABLET | Freq: Every day | ORAL | Status: DC

## 2017-07-17 MED ORDER — CLINDAMYCIN PHOSPHATE 600 MG/50ML IV SOLN: 600.0000 mg | INTRAVENOUS | Status: DC

## 2017-07-17 MED ORDER — GUAIFENESIN-DM 100-10 MG/5ML PO SYRP: 15.0000 mL | ORAL_SOLUTION | ORAL | Status: DC | PRN

## 2017-07-17 MED ORDER — POLYETHYLENE GLYCOL 3350 17 GM/SCOOP PO POWD
17.0000 g | Freq: Every day | ORAL | Status: DC
  Filled 2017-07-17: qty 255

## 2017-07-17 MED ORDER — ONDANSETRON HCL 4 MG/2ML IJ SOLN: 4.0000 mg | Freq: Four times a day (QID) | INTRAMUSCULAR | Status: DC | PRN

## 2017-07-17 MED ORDER — CINACALCET HCL 30 MG PO TABS: 30.0000 mg | ORAL_TABLET | Freq: Every day | ORAL | Status: DC

## 2017-07-17 NOTE — H&P (View-Only) (Signed)
Patient ID: DEVANSH RIESE, male   DOB: 05-08-1951, 66 y.o.   MRN: [id number] Patient has a right upper arm hero graft placed by Dr. Donzetta Matters in September of this year.  He had presented to the emergency room yesterday with concern regarding bleeding.  When I evaluated him the bleeding is stopped and he continued to have patency of his graft with a audible bruit.  He presented to dialysis today.  There was difficulty with access and I was called.  I was in surgery and the operative staff was under the impression that he was having ongoing bleeding episodes and was sent to the emergency department.  He in fact does not have bleeding but was having difficulty with access in his graft is occluded.  Potassium is 5.0.  Discussed options with the patient.  Feel that it is reasonable to attempt thrombectomy of his graft.  This is a early access graft sewn to a hero graft.  The has had early failure with this and feel that his long-term durability this will be poor.  We will plan thrombectomy and possible revision in the morning.

## 2017-07-17 NOTE — Progress Notes (Signed)
Patient ID: Jacob [name], male   DOB: Jun 01, 1951, 66 y.o.   MRN: [id number] Patient has a right upper arm hero graft placed by Dr. Donzetta Matters in September of this year.  He had presented to the emergency room yesterday with concern regarding bleeding.  When I evaluated him the bleeding is stopped and he continued to have patency of his graft with a audible bruit.  He presented to dialysis today.  There was difficulty with access and I was called.  I was in surgery and the operative staff was under the impression that he was having ongoing bleeding episodes and was sent to the emergency department.  He in fact does not have bleeding but was having difficulty with access in his graft is occluded.  Potassium is 5.0.  Discussed options with the patient.  Feel that it is reasonable to attempt thrombectomy of his graft.  This is a early access graft sewn to a hero graft.  The has had early failure with this and feel that his long-term durability this will be poor.  We will plan thrombectomy and possible revision in the morning.

## 2017-07-17 NOTE — ED Triage Notes (Signed)
Pt went to dialysis today and vascular access in right upper arm would not work.

## 2017-07-17 NOTE — ED Notes (Signed)
Pt provided with turkey sandwich and ginger ale.

## 2017-07-17 NOTE — ED Notes (Signed)
Pt asking to eat.  Still awaiting orders from Dr. Donnetta Hutching for admission, unsure of when procedures will take place.  Patient aware, and wife shown to lobby for snacks (refused [place] sandwich).

## 2017-07-17 NOTE — ED Provider Notes (Signed)
[facility] EMERGENCY DEPARTMENT Provider Note   CSN: [id number] Arrival date & time: 07/17/17  1439     History   Chief Complaint Chief Complaint  Patient presents with  . Vascular Access Problem    HPI Jacob [name] is a 66 y.o. male.  Patient with end-stage renal disease on hemodialysis Monday/Wednesday/Friday --presents with malfunctioning hero vascular access device.  He was seen yesterday in the emergency department bleeding related to the graft and discharged.  He was due to make up with dialysis today.  When he went to his dialysis session, they were unable to successfully access the device for hemodialysis and patient did not receive dialysis.  Patient was told to go to the emergency department.  He otherwise feels well.  No chest pain or shortness of breath.  No lower extremity swelling (history of right BKA). The onset of this condition was acute. The course is constant. Aggravating factors: none. Alleviating factors: none.        Past Medical History:  Diagnosis Date  . Anemia   . Anemia in chronic kidney disease(285.21)   . Anxiety   . Arthritis   . Depression   . Dyspnea    with exertion  . ESRD (end stage renal disease) on dialysis [facility])    "MWF; [facility] [place]" (02/18/2017)  . Essential hypertension   . GERD (gastroesophageal reflux disease)   . Glaucoma   . History of blood transfusion   . Insomnia   . Nonischemic cardiomyopathy ([place])   . Stroke [facility]) 2016   - "they said it was not a stroke"  . Type 2 diabetes mellitus ([place])    Type II    Patient Active Problem List   Diagnosis Date Noted  . Hypothyroidism   . Bradycardia 04/09/2017  . Hematoma of groin   . Swelling of arm 03/18/2017  . PAD (peripheral artery disease) ([place])   . Acute blood loss anemia   . Anemia of chronic disease   . DNR (do not resuscitate) 02/24/2017  . Palliative care by specialist 02/24/2017  . Normocytic anemia 02/18/2017  . Hypokalemia 12/28/2016  .  Insulin-requiring or dependent type II diabetes mellitus ([place]) 12/28/2016  . Gangrene of foot ([place]) 12/28/2016  . Gangrene of right foot ([place]) 12/28/2016  . Hyperkalemia 04/05/2016  . Inadequate dialysis 04/05/2016  . Other complications due to renal dialysis device, implant, and graft 04/28/2013  . Subclavian vein occlusion (HCC) 04/28/2013  . Preoperative cardiovascular examination 12/28/2012  . Mixed hyperlipidemia 06/16/2011  . Nonischemic cardiomyopathy ([place]) 11/20/2010  . End-stage renal disease on hemodialysis ([place]) 11/20/2010  . Essential hypertension, benign 11/20/2010    Past Surgical History:  Procedure Laterality Date  . A/V FISTULAGRAM Right 10/06/2016   Procedure: A/V Fistulagram;  Surgeon: [name], MD;  Location: [place] CV LAB;  Service: Cardiovascular;  Laterality: Right;  . A/V FISTULAGRAM Right 04/27/2017   Procedure: A/V Fistulagram;  Surgeon: [name], MD;  Location: [place] [facility];  Service: Cardiovascular;  Laterality: Right;  . ABDOMINAL AORTOGRAM N/A 12/30/2016   Procedure: Abdominal Aortogram;  Surgeon: Waynetta Sandy, MD;  Location: [place] CV LAB;  Service: Cardiovascular;  Laterality: N/A;  . ABDOMINAL AORTOGRAM N/A 04/08/2017   Procedure: ABDOMINAL AORTOGRAM;  Surgeon: Conrad Kistler, MD;  Location: [place] [facility];  Service: Cardiovascular;  Laterality: N/A;  . ABDOMINAL AORTOGRAM W/LOWER EXTREMITY N/A 02/23/2017   Procedure: Abdominal Aortogram w/Lower Extremity;  Surgeon: [name], MD;  Location: [place] [facility] LAB;  Service: Cardiovascular;  Laterality: N/A;  Rt. leg  . AMPUTATION Right 02/28/2017   Procedure: RIGHT BELOW KNEE AMPUTATION;  Surgeon: [name], MD;  Location: [facility];  Service: Vascular;  Laterality: Right;  . AV FISTULA PLACEMENT     Hx: of  . AV FISTULA PLACEMENT Right 05/04/2013   Procedure: ARTERIOVENOUS (AV) FISTULA CREATION- RIGHT ARM;  Surgeon: Conrad [place], MD;  Location: [place];  Service:  Vascular;  Laterality: Right;  Ultrasound guided  . AV FISTULA PLACEMENT Right 07/28/2016   Procedure: BRACHIOCEPHALIC ARTERIOVENOUS (AV) FISTULA CREATION;  Surgeon: [name], MD;  Location: [place];  Service: Vascular;  Laterality: Right;  . BACK SURGERY    . CATARACT EXTRACTION W/PHACO Left 07/16/2014   Procedure: CATARACT EXTRACTION PHACO AND INTRAOCULAR LENS PLACEMENT (IOC);  Surgeon: [name], MD;  Location: [facility];  Service: Ophthalmology;  Laterality: Left;  CDE:8.86  . CATARACT EXTRACTION W/PHACO Right 07/30/2014   Procedure: CATARACT EXTRACTION PHACO AND INTRAOCULAR LENS PLACEMENT (IOC);  Surgeon: [name], MD;  Location: [facility];  Service: Ophthalmology;  Laterality: Right;  CDE 8.99  . COLONOSCOPY     Hx: of  . EYE SURGERY    . FISTULA SUPERFICIALIZATION Right 07/28/2016   Procedure: FISTULA SUPERFICIALIZATION;  Surgeon: [name], MD;  Location: [place];  Service: Vascular;  Laterality: Right;  . FISTULA SUPERFICIALIZATION Right 10/13/2016   Procedure: BRACHIOCEPHALIC ARTERIOVENOUS FISTULA SUPERFICIALIZATION;  Surgeon: [name], MD;  Location: [place];  Service: Vascular;  Laterality: Right;  . FISTULOGRAM N/A 05/04/2013   Procedure: CENTRAL VENOGRAM;  Surgeon: Conrad [place], MD;  Location: [place];  Service: Vascular;  Laterality: N/A;  . INSERTION OF DIALYSIS CATHETER Right 05/04/2013   Procedure: INSERTION OF DIALYSIS CATHETER;  Surgeon: Conrad No Name, MD;  Location: [place];  Service: Vascular;  Laterality: Right;  Ultrasound guided  . INSERTION OF DIALYSIS CATHETER N/A 07/28/2016   Procedure: INSERTION OF DIALYSIS CATHETER;  Surgeon: [name], MD;  Location: [place];  Service: Vascular;  Laterality: N/A;  . IR FLUORO GUIDE CV LINE RIGHT  04/13/2017  . IR US GUIDE VASC ACCESS RIGHT  04/13/2017  . LIGATION OF ARTERIOVENOUS  FISTULA Left 05/04/2013   Procedure: LIGATION OF LEFT RADIAL CEPHALIC ARTERIOVENOUS  FISTULA;  Surgeon: Conrad [place], MD;   Location: [place];  Service: Vascular;  Laterality: Left;  Ultrasound guided  . LIGATION OF ARTERIOVENOUS  FISTULA Right 07/28/2016   Procedure: LIGATION OF RADIOCEPHALIC ARTERIOVENOUS  FISTULA;  Surgeon: [name], MD;  Location: [place];  Service: Vascular;  Laterality: Right;  . LOWER EXTREMITY ANGIOGRAPHY N/A 12/30/2016   Procedure: Lower Extremity Angiography;  Surgeon: Waynetta Sandy, MD;  Location: [place] CV LAB;  Service: Cardiovascular;  Laterality: N/A;  . LUMBAR SPINE SURGERY    . PERIPHERAL VASCULAR ATHERECTOMY Right 12/30/2016   Procedure: Peripheral Vascular Atherectomy;  Surgeon: Waynetta Sandy, MD;  Location: [facility];  Service: Cardiovascular;  Laterality: Right;  AT and PT  . PERIPHERAL VASCULAR BALLOON ANGIOPLASTY Right 12/30/2016   Procedure: Peripheral Vascular Balloon Angioplasty;  Surgeon: Waynetta Sandy, MD;  Location: [place] [facility];  Service: Cardiovascular;  Laterality: Right;  PTA of PT and AT  . PERIPHERAL VASCULAR BALLOON ANGIOPLASTY  02/23/2017   Procedure: Peripheral Vascular Balloon Angioplasty;  Surgeon: [name], MD;  Location: [place] [facility];  Service: Cardiovascular;;  RT. Anterior Tib.  [name]  PERIPHERAL VASCULAR BALLOON ANGIOPLASTY  04/08/2017   Procedure: PERIPHERAL VASCULAR BALLOON ANGIOPLASTY;  Surgeon: Conrad [place], MD;  Location: [place] [facility];  Service: Cardiovascular;;  . PERIPHERAL VASCULAR CATHETERIZATION N/A 01/24/2015   Procedure: Fistulagram;  Surgeon: Conrad Gentry, MD;  Location: [place] [facility];  Service: Cardiovascular;  Laterality: N/A;  . PERIPHERAL VASCULAR CATHETERIZATION Right 06/04/2016   Procedure: A/V Shuntogram/Fistulagram;  Surgeon: Conrad [place], MD;  Location: [place] [facility];  Service: Cardiovascular;  Laterality: Right;  . REVISON OF ARTERIOVENOUS FISTULA Right 01/02/2014   Procedure: REVISON OF ARTERIOVENOUS FISTULA ANASTOMOSIS;  Surgeon: Conrad Lavina, MD;   Location: [place];  Service: Vascular;  Laterality: Right;  . REVISON OF ARTERIOVENOUS FISTULA Right 01/02/2016   Procedure: REVISION OF RADIOCEPHALIC ARTERIOVENOUS FISTULA  with BOVINE PATCH ANGIOPLASTY RIGHT RADIAL ARTERY;  Surgeon: Mal [name], MD;  Location: [facility];  Service: Vascular;  Laterality: Right;  . SHUNTOGRAM N/A 11/07/2013   Procedure: Fistulogram;  Surgeon: [name], MD;  Location: [facility] CATH LAB;  Service: Cardiovascular;  Laterality: N/A;  . TRANSMETATARSAL AMPUTATION Right 01/01/2017   Procedure: TRANSMETATARSAL AMPUTATION;  Surgeon: [name], MD;  Location: [facility];  Service: Vascular;  Laterality: Right;  [name] VASCULAR ACCESS DEVICE INSERTION Right 05/13/2017   Procedure: INSERTION OF HERO VASCULAR ACCESS DEVICE RIGHT UPPER ARM;  Surgeon: Waynetta Sandy, MD;  Location: [place];  Service: Vascular;  Laterality: Right;  . VENOGRAM N/A 05/13/2017   Procedure: CENTRAL VENOGRAM;  Surgeon: Waynetta Sandy, MD;  Location: [place];  Service: Vascular;  Laterality: N/A;       Home Medications    Prior to Admission medications   Medication Sig Start Date End Date Taking? Authorizing Provider  acetaminophen (TYLENOL) 500 MG tablet Take 500 mg by mouth every 6 (six) hours as needed for mild pain. Do not exceed 4 gms of tylenol in 24 hours   Yes [provider]  aspirin EC 81 MG tablet Take 81 mg by mouth daily.   Yes [provider]  atorvastatin (LIPITOR) 40 MG tablet Take 1 tablet (40 mg total) by mouth daily at 6 PM. 02/26/17  Yes Short, Noah Delaine, MD  B Complex-C-Folic Acid (DIALYVITE PO) Take 1 tablet by mouth every Monday, Wednesday, and Friday.   Yes [provider]  bisacodyl (DULCOLAX) 5 MG EC tablet Take 2 tablets (10 mg total) by mouth daily as needed for moderate constipation. 01/04/17  Yes Mikhail, Maryann, DO  carvedilol (COREG) 12.5 MG tablet Take 1 tablet (12.5 mg total) by mouth See admin instructions. Take 1 tablet (12.5  mg) by mouth twice daily on Sunday, Tuesday, Thursday, Saturday (hold on dialysis days) 04/10/17  Yes Barton Dubois, MD  cinacalcet (SENSIPAR) 30 MG tablet Take 30 mg by mouth daily with breakfast.    Yes [provider]  EPINEPHrine (EPIPEN 2-PAK) 0.3 mg/0.3 mL IJ SOAJ injection Inject 0.3 mg into the muscle once as needed (anaphylaxis).   Yes [provider]  finasteride (PROSCAR) 5 MG tablet Take 1 tablet (5 mg total) by mouth daily. 02/27/17  Yes Short, Noah Delaine, MD  HYDROcodone-acetaminophen (NORCO/VICODIN) 5-325 MG tablet Take 1 tablet by mouth every 4 (four) hours as needed for moderate pain. Patient taking differently: Take 1 tablet by mouth every 8 (eight) hours as needed for moderate pain.  02/26/17  Yes Short, Noah Delaine, MD  Hypromellose (ARTIFICIAL TEARS) 0.4 % SOLN Place 1 drop into both eyes every 6 (six) hours as  needed (dry eyes).   Yes [provider]  LANTUS SOLOSTAR 100 UNIT/ML Solostar Pen Inject 15 Units into the skin at bedtime. Patient taking differently: Inject 16 Units into the skin at bedtime.  03/02/17  Yes Short, Noah Delaine, MD  omeprazole (PRILOSEC) 40 MG capsule Take 40 mg by mouth daily.   Yes [provider]  oxyCODONE-acetaminophen (ROXICET) 5-325 MG tablet Take 1-2 tablets by mouth every 6 (six) hours as needed. 05/13/17 05/13/18 Yes Waynetta Sandy, MD  polyethylene glycol powder [facility]) powder Take 17 g by mouth daily. Patient taking differently: Take 17 g by mouth daily. Mix in 8 oz liquid and drink 02/26/17  Yes Short, Noah Delaine, MD  senna (SENOKOT) 8.6 MG TABS tablet Take 2 tablets (17.2 mg total) by mouth at bedtime. 02/26/17  Yes Short, Noah Delaine, MD  sevelamer carbonate (RENVELA) 800 MG tablet Take 2,400 mg by mouth 3 (three) times daily with meals.    Yes [provider]  SPS 15 GM/60ML suspension Take 15 g by mouth once. 07/13/17  Yes [provider]    Family History Family History    Problem Relation Age of Onset  . Heart attack Brother        73  . Heart disease Brother        before age 13  . Hypertension Brother   . Heart attack Brother        67  . Heart attack Brother        72  . Diabetes Mother   . Hypertension Mother   . Heart disease Mother   . Heart disease Father   . Hypertension Father   . Other Father        amputation  . Hypertension Sister   . Heart disease Sister   . Vision loss Maternal Uncle     Social History Social History   Tobacco Use  . Smoking status: Never Smoker  . Smokeless tobacco: Never Used  Substance Use Topics  . Alcohol use: Yes    Comment: 1- fifth of gin a week  . Drug use: No     Allergies   Bee venom; Iodine; Penicillins; Sulfadiazine; and Plavix [clopidogrel]   Review of Systems Review of Systems  Constitutional: Negative for fever.  HENT: Negative for rhinorrhea and sore throat.   Eyes: Negative for redness.  Respiratory: Negative for cough.   Cardiovascular: Negative for chest pain and leg swelling.  Gastrointestinal: Negative for abdominal pain, diarrhea, nausea and vomiting.  Genitourinary: Negative for dysuria.  Musculoskeletal: Negative for myalgias.  Skin: Negative for rash.  Neurological: Negative for headaches.     Physical Exam Updated Vital Signs BP (!) 155/53   Pulse (!) 59   Temp 98 F (36.7 C) (Oral)   Resp 20   SpO2 100%   Physical Exam  Constitutional: He appears well-developed and well-nourished.  HENT:  Head: Normocephalic and atraumatic.  Mouth/Throat: Oropharynx is clear and moist.  Eyes: Conjunctivae are normal. Right eye exhibits no discharge. Left eye exhibits no discharge.  Neck: Normal range of motion. Neck supple.  Cardiovascular: Normal rate, regular rhythm and normal heart sounds.  Pulmonary/Chest: Effort normal and breath sounds normal. No respiratory distress. He has no wheezes. He has no rales.  Abdominal: Soft. There is no tenderness.  Musculoskeletal:   L BKA, R LE no edema.   Neurological: He is alert.  Skin: Skin is warm and dry.  Psychiatric: He has a normal mood and affect.  Nursing note  and vitals reviewed.    ED Treatments / Results  Labs (all labs ordered are listed, but only abnormal results are displayed) Labs Reviewed  BASIC METABOLIC PANEL - Abnormal; Notable for the following components:      Result Value   Chloride 100 (*)    CO2 21 (*)    Glucose, Bld 139 (*)    BUN 67 (*)    Creatinine, Ser 10.56 (*)    Calcium 7.4 (*)    GFR calc non Af Amer 4 (*)    GFR calc Af Amer 5 (*)    All other components within normal limits  CBC    EKG  EKG Interpretation None       Radiology No results found.  Procedures Procedures (including critical care time)  Medications Ordered in ED Medications - No data to display   Initial Impression / Assessment and Plan / ED Course  I have reviewed the triage vital signs and the nursing notes.  Pertinent labs & imaging results that were available during my care of the patient were reviewed by me and considered in my medical decision making (see chart for details).     Patient seen and examined. Work-up initiated. Will touch base with vascular surgery.   Vital signs reviewed and are as follows: BP (!) 155/53   Pulse (!) 59   Temp 98 F (36.7 C) (Oral)   Resp 20   SpO2 100%   5:58 PM Pt discussed with Dr. Leonette Monarch. Spoke with Dr. Donnetta Hutching who will see.   6:41 PM Discussed with Dr. Donnetta Hutching. Pt to be admitted for thrombectomy in AM.   Final Clinical Impressions(s) / ED Diagnoses   Final diagnoses:  Thrombosis of dialysis vascular access, initial encounter ([place])   Admit. No fluid overload, K=5.0. No indications for emergent dialysis tonight.   ED Discharge Orders    None       Carlisle Cater, Hershal Coria 07/17/17 1841    Fatima Blank, MD 07/18/17 [phone]

## 2017-07-17 NOTE — ED Triage Notes (Signed)
TC from Lab clotted  CBC . Triage Lab tech [place].

## 2017-07-18 ENCOUNTER — Observation Stay (HOSPITAL_COMMUNITY): Payer: Medicare Other | Admitting: Certified Registered Nurse Anesthetist

## 2017-07-18 ENCOUNTER — Other Ambulatory Visit: Payer: Self-pay

## 2017-07-18 ENCOUNTER — Encounter (HOSPITAL_COMMUNITY)
Admission: EM | Disposition: A | Discharge status: Home / Self Care | Payer: Self-pay | Source: Home / Self Care | Attending: Emergency Medicine

## 2017-07-18 ENCOUNTER — Encounter (HOSPITAL_COMMUNITY): Payer: Self-pay | Admitting: Certified Registered Nurse Anesthetist

## 2017-07-18 DIAGNOSIS — Z992 Dependence on renal dialysis: Secondary | ICD-10-CM | POA: Diagnosis not present

## 2017-07-18 DIAGNOSIS — E876 Hypokalemia: Secondary | ICD-10-CM | POA: Diagnosis not present

## 2017-07-18 DIAGNOSIS — T82868A Thrombosis of vascular prosthetic devices, implants and grafts, initial encounter: Secondary | ICD-10-CM

## 2017-07-18 DIAGNOSIS — R001 Bradycardia, unspecified: Secondary | ICD-10-CM | POA: Diagnosis not present

## 2017-07-18 DIAGNOSIS — E039 Hypothyroidism, unspecified: Secondary | ICD-10-CM | POA: Diagnosis not present

## 2017-07-18 DIAGNOSIS — I96 Gangrene, not elsewhere classified: Secondary | ICD-10-CM | POA: Diagnosis not present

## 2017-07-18 DIAGNOSIS — I120 Hypertensive chronic kidney disease with stage 5 chronic kidney disease or end stage renal disease: Secondary | ICD-10-CM | POA: Diagnosis not present

## 2017-07-18 DIAGNOSIS — Z794 Long term (current) use of insulin: Secondary | ICD-10-CM | POA: Diagnosis not present

## 2017-07-18 DIAGNOSIS — N186 End stage renal disease: Secondary | ICD-10-CM | POA: Diagnosis not present

## 2017-07-18 DIAGNOSIS — D631 Anemia in chronic kidney disease: Secondary | ICD-10-CM | POA: Diagnosis not present

## 2017-07-18 DIAGNOSIS — E1122 Type 2 diabetes mellitus with diabetic chronic kidney disease: Secondary | ICD-10-CM | POA: Diagnosis not present

## 2017-07-18 DIAGNOSIS — D62 Acute posthemorrhagic anemia: Secondary | ICD-10-CM | POA: Diagnosis not present

## 2017-07-18 HISTORY — PX: THROMBECTOMY W/ EMBOLECTOMY: SHX2507

## 2017-07-18 LAB — COMPREHENSIVE METABOLIC PANEL
ALBUMIN: 3.2 g/dL — AB (ref 3.5–5.0)
ALT: 16 U/L — ABNORMAL LOW (ref 17–63)
ANION GAP: 15 (ref 5–15)
AST: 25 U/L (ref 15–41)
Alkaline Phosphatase: 276 U/L — ABNORMAL HIGH (ref 38–126)
BILIRUBIN TOTAL: 1.2 mg/dL (ref 0.3–1.2)
BUN: 76 mg/dL — AB (ref 6–20)
CHLORIDE: 97 mmol/L — AB (ref 101–111)
CO2: 24 mmol/L (ref 22–32)
Calcium: 7.8 mg/dL — ABNORMAL LOW (ref 8.9–10.3)
Creatinine, Ser: 11.29 mg/dL — ABNORMAL HIGH (ref 0.61–1.24)
GFR calc Af Amer: 5 mL/min — ABNORMAL LOW (ref >60)
GFR, EST NON AFRICAN AMERICAN: 4 mL/min — AB (ref >60)
GLUCOSE: 91 mg/dL (ref 65–99)
POTASSIUM: 4.8 mmol/L (ref 3.5–5.1)
Sodium: 136 mmol/L (ref 135–145)
TOTAL PROTEIN: 8 g/dL (ref 6.5–8.1)

## 2017-07-18 LAB — CBC
HCT: 35.9 % — ABNORMAL LOW (ref 39.0–52.0)
HEMOGLOBIN: 11 g/dL — AB (ref 13.0–17.0)
MCH: 27.8 pg (ref 26.0–34.0)
MCHC: 30.6 g/dL (ref 30.0–36.0)
MCV: 90.9 fL (ref 78.0–100.0)
PLATELETS: 189 10*3/uL (ref 150–400)
RBC: 3.95 MIL/uL — AB (ref 4.22–5.81)
RDW: 17.5 % — ABNORMAL HIGH (ref 11.5–15.5)
WBC: 5 10*3/uL (ref 4.0–10.5)

## 2017-07-18 LAB — GLUCOSE, CAPILLARY
GLUCOSE-CAPILLARY: 74 mg/dL (ref 65–99)
GLUCOSE-CAPILLARY: 84 mg/dL (ref 65–99)
Glucose-Capillary: 193 mg/dL — ABNORMAL HIGH (ref 65–99)

## 2017-07-18 LAB — SURGICAL PCR SCREEN
MRSA, PCR: NEGATIVE
Staphylococcus aureus: POSITIVE — AB

## 2017-07-18 LAB — PROTIME-INR
INR: 1.35
PROTHROMBIN TIME: 16.5 s — AB (ref 11.4–15.2)

## 2017-07-18 SURGERY — THROMBECTOMY ARTERIOVENOUS GORE-TEX GRAFT
Anesthesia: General | Site: Arm Upper | Laterality: Right

## 2017-07-18 MED ORDER — FENTANYL CITRATE (PF) 100 MCG/2ML IJ SOLN
INTRAMUSCULAR | Status: DC | PRN
  Administered 2017-07-18: 50 ug via INTRAVENOUS

## 2017-07-18 MED ORDER — ONDANSETRON HCL 4 MG/2ML IJ SOLN: 4.0000 mg | Freq: Once | INTRAMUSCULAR | Status: DC | PRN

## 2017-07-18 MED ORDER — SODIUM CHLORIDE 0.9 % IV SOLN
INTRAVENOUS | Status: DC | PRN
  Administered 2017-07-18: 07:00:00 via INTRAVENOUS

## 2017-07-18 MED ORDER — OXYCODONE-ACETAMINOPHEN 5-325 MG PO TABS: 1.0000 | ORAL_TABLET | Freq: Four times a day (QID) | ORAL | 0 refills | Status: DC | PRN

## 2017-07-18 MED ORDER — MIDAZOLAM HCL 2 MG/2ML IJ SOLN
INTRAMUSCULAR | Status: AC
  Filled 2017-07-18: qty 2

## 2017-07-18 MED ORDER — SODIUM CHLORIDE 0.9 % IV SOLN
INTRAVENOUS | Status: DC | PRN
  Administered 2017-07-18: 07:00:00

## 2017-07-18 MED ORDER — MUPIROCIN 2 % EX OINT: 1.0000 "application " | TOPICAL_OINTMENT | Freq: Two times a day (BID) | CUTANEOUS | Status: DC

## 2017-07-18 MED ORDER — EPHEDRINE SULFATE-NACL 50-0.9 MG/10ML-% IV SOSY
PREFILLED_SYRINGE | INTRAVENOUS | Status: DC | PRN
  Administered 2017-07-18: 5 mg via INTRAVENOUS
  Administered 2017-07-18: 10 mg via INTRAVENOUS

## 2017-07-18 MED ORDER — SODIUM CHLORIDE 0.9 % IR SOLN
Status: DC | PRN
  Administered 2017-07-18: 1

## 2017-07-18 MED ORDER — PROPOFOL 10 MG/ML IV BOLUS
INTRAVENOUS | Status: AC
  Filled 2017-07-18: qty 20

## 2017-07-18 MED ORDER — ONDANSETRON HCL 4 MG/2ML IJ SOLN
INTRAMUSCULAR | Status: DC | PRN
  Administered 2017-07-18: 4 mg via INTRAVENOUS

## 2017-07-18 MED ORDER — LIDOCAINE-EPINEPHRINE 0.5 %-1:200000 IJ SOLN
INTRAMUSCULAR | Status: DC | PRN
  Administered 2017-07-18: 50 mL

## 2017-07-18 MED ORDER — VANCOMYCIN HCL 1000 MG IV SOLR
INTRAVENOUS | Status: DC | PRN
  Administered 2017-07-18: 1000 mg via INTRAVENOUS

## 2017-07-18 MED ORDER — CHLORHEXIDINE GLUCONATE CLOTH 2 % EX PADS
6.0000 | MEDICATED_PAD | Freq: Every day | CUTANEOUS | Status: DC
  Administered 2017-07-18: 6 via TOPICAL

## 2017-07-18 MED ORDER — LIDOCAINE 2% (20 MG/ML) 5 ML SYRINGE
INTRAMUSCULAR | Status: AC
  Filled 2017-07-18: qty 5

## 2017-07-18 MED ORDER — PHENYLEPHRINE 40 MCG/ML (10ML) SYRINGE FOR IV PUSH (FOR BLOOD PRESSURE SUPPORT)
PREFILLED_SYRINGE | INTRAVENOUS | Status: DC | PRN
  Administered 2017-07-18 (×2): 80 ug via INTRAVENOUS
  Administered 2017-07-18: 120 ug via INTRAVENOUS
  Administered 2017-07-18: 40 ug via INTRAVENOUS
  Administered 2017-07-18: 80 ug via INTRAVENOUS

## 2017-07-18 MED ORDER — PHENYLEPHRINE HCL 10 MG/ML IJ SOLN
INTRAVENOUS | Status: DC | PRN
  Administered 2017-07-18: 20 ug/min via INTRAVENOUS

## 2017-07-18 MED ORDER — MIDAZOLAM HCL 5 MG/5ML IJ SOLN
INTRAMUSCULAR | Status: DC | PRN
  Administered 2017-07-18: 1 mg via INTRAVENOUS

## 2017-07-18 MED ORDER — ONDANSETRON HCL 4 MG/2ML IJ SOLN
INTRAMUSCULAR | Status: AC
  Filled 2017-07-18: qty 2

## 2017-07-18 MED ORDER — HYDROMORPHONE HCL 1 MG/ML IJ SOLN: 0.2500 mg | INTRAMUSCULAR | Status: DC | PRN

## 2017-07-18 MED ORDER — HEPARIN SODIUM (PORCINE) 1000 UNIT/ML IJ SOLN
INTRAMUSCULAR | Status: AC
  Filled 2017-07-18: qty 1

## 2017-07-18 MED ORDER — VANCOMYCIN HCL IN DEXTROSE 1-5 GM/200ML-% IV SOLN
INTRAVENOUS | Status: AC
  Filled 2017-07-18: qty 200

## 2017-07-18 MED ORDER — PHENYLEPHRINE 40 MCG/ML (10ML) SYRINGE FOR IV PUSH (FOR BLOOD PRESSURE SUPPORT)
PREFILLED_SYRINGE | INTRAVENOUS | Status: AC
  Filled 2017-07-18: qty 10

## 2017-07-18 MED ORDER — HEPARIN SODIUM (PORCINE) 1000 UNIT/ML IJ SOLN
INTRAMUSCULAR | Status: DC | PRN
  Administered 2017-07-18: 6000 [IU] via INTRAVENOUS

## 2017-07-18 MED ORDER — PROPOFOL 500 MG/50ML IV EMUL
INTRAVENOUS | Status: DC | PRN
  Administered 2017-07-18: 100 ug/kg/min via INTRAVENOUS

## 2017-07-18 MED ORDER — MEPERIDINE HCL 25 MG/ML IJ SOLN: 6.2500 mg | INTRAMUSCULAR | Status: DC | PRN

## 2017-07-18 MED ORDER — EPHEDRINE 5 MG/ML INJ
INTRAVENOUS | Status: AC
  Filled 2017-07-18: qty 10

## 2017-07-18 MED ORDER — FENTANYL CITRATE (PF) 250 MCG/5ML IJ SOLN
INTRAMUSCULAR | Status: AC
  Filled 2017-07-18: qty 5

## 2017-07-18 SURGICAL SUPPLY — 33 items
ADH SKN CLS APL DERMABOND .7 (GAUZE/BANDAGES/DRESSINGS) ×1
ARMBAND PINK RESTRICT EXTREMIT (MISCELLANEOUS) ×3 IMPLANT
CANISTER SUCT 3000ML PPV (MISCELLANEOUS) ×3 IMPLANT
CANNULA VESSEL 3MM 2 BLNT TIP (CANNULA) ×3 IMPLANT
CATH EMB 3FR 80CM (CATHETERS) ×2 IMPLANT
CATH EMB 4FR 80CM (CATHETERS) ×3 IMPLANT
CLIP LIGATING EXTRA MED SLVR (CLIP) ×3 IMPLANT
CLIP LIGATING EXTRA SM BLUE (MISCELLANEOUS) ×3 IMPLANT
DECANTER SPIKE VIAL GLASS SM (MISCELLANEOUS) ×3 IMPLANT
DERMABOND ADVANCED (GAUZE/BANDAGES/DRESSINGS) ×2
DERMABOND ADVANCED .7 DNX12 (GAUZE/BANDAGES/DRESSINGS) ×1 IMPLANT
ELECT REM PT RETURN 9FT ADLT (ELECTROSURGICAL) ×3
ELECTRODE REM PT RTRN 9FT ADLT (ELECTROSURGICAL) ×1 IMPLANT
GLOVE BIOGEL PI IND STRL 7.0 (GLOVE) IMPLANT
GLOVE BIOGEL PI IND STRL 7.5 (GLOVE) IMPLANT
GLOVE BIOGEL PI INDICATOR 7.0 (GLOVE) ×2
GLOVE BIOGEL PI INDICATOR 7.5 (GLOVE) ×4
GLOVE SS BIOGEL STRL SZ 7.5 (GLOVE) ×1 IMPLANT
GLOVE SUPERSENSE BIOGEL SZ 7.5 (GLOVE) ×2
GLOVE SURG SS PI 7.5 STRL IVOR (GLOVE) ×4 IMPLANT
GOWN STRL REUS W/ TWL LRG LVL3 (GOWN DISPOSABLE) ×3 IMPLANT
GOWN STRL REUS W/TWL LRG LVL3 (GOWN DISPOSABLE) ×9
KIT BASIN OR (CUSTOM PROCEDURE TRAY) ×3 IMPLANT
KIT ROOM TURNOVER OR (KITS) ×3 IMPLANT
NS IRRIG 1000ML POUR BTL (IV SOLUTION) ×3 IMPLANT
PACK CV ACCESS (CUSTOM PROCEDURE TRAY) ×3 IMPLANT
PAD ARMBOARD 7.5X6 YLW CONV (MISCELLANEOUS) ×6 IMPLANT
SUT PROLENE 6 0 CC (SUTURE) ×3 IMPLANT
SUT VIC AB 3-0 SH 27 (SUTURE) ×3
SUT VIC AB 3-0 SH 27X BRD (SUTURE) ×1 IMPLANT
TOWEL GREEN STERILE (TOWEL DISPOSABLE) ×3 IMPLANT
UNDERPAD 30X30 (UNDERPADS AND DIAPERS) ×3 IMPLANT
WATER STERILE IRR 1000ML POUR (IV SOLUTION) ×3 IMPLANT

## 2017-07-18 NOTE — Op Note (Signed)
    OPERATIVE REPORT  DATE OF SURGERY: 07/18/2017  PATIENT: Jacob [name], 67 y.o. male MRN: [id number]  DOB: 01-02-1951  PRE-OPERATIVE DIAGNOSIS: End-stage renal disease with occluded right upper arm hero graft  POST-OPERATIVE DIAGNOSIS:  Same  PROCEDURE: Thrombectomy of right arm hero graft  SURGEON:  Curt Jews, M.D.  PHYSICIAN ASSISTANT: Nurse  ANESTHESIA: General  EBL: Minimal ml  Total I/O In: 200 [I.V.:200] Out: 100 [Blood:100]  BLOOD ADMINISTERED: None  DRAINS: None  SPECIMEN: None  COUNTS CORRECT:  YES  PLAN OF CARE: PACU  PATIENT DISPOSITION:  PACU - hemodynamically stable  PROCEDURE DETAILS: Patient's a 66 year old gentleman with a very difficult access situation.  He has known central venous occlusion on his left and right innominate veins.  He had a right side hero graft placed by Dr. Donzetta Matters in September 2018.  He at that time had a Yarely Bebee stick AcuSeal graft sewn from the brachial artery to the hero central portion.  He has had some difficulty with access.  He was seen in the emergency department on 1214 due to prolonged bleeding.  At that time the bleeding is stopped when he had arrived to the emergency department from the [place] in [facility].  He read presented to the [place] on Saturday and had found to have occlusion of his graft.  He was sent to the emergency department for evaluation.  I discussed options with the patient and his wife present.  Explained that we could attempt open his graft.  Feel that he has a very high with this.  His only other option would be for a leg graft.  He wished to proceed with attempted salvage of his hero.  He was taken to the operating room this morning placed supine position where the area of the arm and the chest were prepped and draped in the usual sterile fashion.  Local anesthesia was used at the junction of the hero central portion and the graft.  The area of anastomosis with the AcuSeal and the PTFE  from the hero device was identified and was opened.  There actually was some flow although very poor through this.  The 3 Fogarty catheter was passed centrally and there was very brisk high pressure venous backbleeding.  This was flushed with heparinized saline and reoccluded.  The patient was given 6000 units of intravenous heparin.  The arterial portion of the graft was thrombectomized.  There was some platelet aggregate type of clot and removed.  There was good inflow and no suggestion of any anastomotic difficulty and the catheter coming through the brachial anastomosis.  This was flushed with heparinized saline and reoccluded.  The anastomosis was reclosed with running 6-0 Prolene suture.  Clamps were removed and flow was noted through the graft.  The wound was closed with 3-0 Vicryl in the subcutaneous and subcuticular tissue.  Sterile dressing was applied and the patient was transferred to the recovery room in stable condition  I do feel that if he has Aaniyah Strohm thrombosis or difficulty with this that there should be no other attempts at salvage.  I do feel that he would be an option for placement of a central catheter over his guidewire with a central portion of his hero and then placement of a right femoral loop AV Gore-Tex graft.  He does have a right below-knee amputation but 2-3+ right femoral pulse.   [name], M.D., [facility] 07/18/2017 9:52 AM

## 2017-07-18 NOTE — Transfer of Care (Signed)
Immediate Anesthesia Transfer of Care Note  Patient: Jacob [name]  Procedure(s) Performed: THROMBECTOMY ARTERIOVENOUS HERO GRAFT ARM (Right Arm Upper)  Patient Location: PACU  Anesthesia Type:General  Level of Consciousness: awake, alert  and oriented  Airway & Oxygen Therapy: Patient Spontanous Breathing and Patient connected to face mask oxygen  Post-op Assessment: Report given to RN, Post -op Vital signs reviewed and stable and Patient moving all extremities X 4  Post vital signs: Reviewed and stable  Last Vitals:  Vitals:   07/18/17 0220 07/18/17 0507  BP: (!) 166/63 (!) 150/74  Pulse: 63 64  Resp:  18  Temp: 36.6 C (!) 36.4 C  SpO2: 100% 98%    Last Pain:  Vitals:   07/18/17 0507  TempSrc: Oral  PainSc:          Complications: No apparent anesthesia complications

## 2017-07-18 NOTE — Interval H&P Note (Signed)
History and Physical Interval Note:  07/18/2017 7:41 AM  Jacob [name]  has presented today for surgery, with the diagnosis of End state renal disease  The various methods of treatment have been discussed with the patient and family. After consideration of risks, benefits and other options for treatment, the patient has consented to  Procedure(s): THROMBECTOMY ARTERIOVENOUS HERO GRAFT ARM (Right) as a surgical intervention .  The patient's history has been reviewed, patient examined, no change in status, stable for surgery.  I have reviewed the patient's chart and labs.  Questions were answered to the patient's satisfaction.     Curt Jews

## 2017-07-18 NOTE — Anesthesia Procedure Notes (Signed)
Procedure Name: LMA Insertion Date/Time: 07/18/2017 8:16 AM Performed by: Harden Mo, CRNA Pre-anesthesia Checklist: Patient identified, Emergency Drugs available, Suction available and Patient being monitored Patient Re-evaluated:Patient Re-evaluated prior to induction Oxygen Delivery Method: Circle System Utilized Preoxygenation: Pre-oxygenation with 100% oxygen Induction Type: IV induction LMA: LMA inserted LMA Size: 5.0 Number of attempts: 1 Airway Equipment and Method: Bite block Placement Confirmation: positive ETCO2 Tube secured with: Tape Dental Injury: Teeth and Oropharynx as per pre-operative assessment

## 2017-07-18 NOTE — Anesthesia Postprocedure Evaluation (Signed)
Anesthesia Post Note  Patient: ARCHIE SHEA  Procedure(s) Performed: THROMBECTOMY ARTERIOVENOUS HERO GRAFT ARM (Right Arm Upper)     Patient location during evaluation: PACU Anesthesia Type: MAC Level of consciousness: awake and alert Pain management: pain level controlled Vital Signs Assessment: post-procedure vital signs reviewed and stable Respiratory status: spontaneous breathing, nonlabored ventilation, respiratory function stable and patient connected to nasal cannula oxygen Cardiovascular status: blood pressure returned to baseline and stable Postop Assessment: no apparent nausea or vomiting Anesthetic complications: no    Last Vitals:  Vitals:   07/18/17 0930 07/18/17 0943  BP: (!) 166/84   Pulse: 64   Resp: 15   Temp:  36.5 C  SpO2: 98%     Last Pain:  Vitals:   07/18/17 0507  TempSrc: Oral  PainSc:                  Yareliz Thorstenson DAVID

## 2017-07-18 NOTE — Discharge Instructions (Signed)
° °  [facility]  ° °Discharge Instructions ° °AV Fistula or Graft Surgery for Dialysis Access ° °Please refer to the following instructions for your post-procedure care. Your surgeon or physician assistant will discuss any changes with you. ° °Activity ° °You may drive the day following your surgery, if you are comfortable and no longer taking prescription pain medication. Resume full activity as the soreness in your incision resolves. ° °Bathing/Showering ° °You may shower after you go home. Keep your incision dry for 48 hours. Do not soak in a bathtub, hot tub, or swim until the incision heals completely. You may not shower if you have a hemodialysis catheter. ° °Incision Care ° °Clean your incision with mild soap and water after 48 hours. Pat the area dry with a clean towel. You do not need a bandage unless otherwise instructed. Do not apply any ointments or creams to your incision. You may have skin glue on your incision. Do not peel it off. It will come off on its own in about one week. Your arm may swell a bit after surgery. To reduce swelling use pillows to elevate your arm so it is above your heart. Your doctor will tell you if you need to lightly wrap your arm with an ACE bandage. ° °Diet ° °Resume your normal diet. There are not special food restrictions following this procedure. In order to heal from your surgery, it is CRITICAL to get adequate nutrition. Your body requires vitamins, minerals, and protein. Vegetables are the best source of vitamins and minerals. Vegetables also provide the perfect balance of protein. Processed food has little nutritional value, so try to avoid this. ° °Medications ° °Resume taking all of your medications. If your incision is causing pain, you may take over-the counter pain relievers such as acetaminophen (Tylenol). If you were prescribed a stronger pain medication, please be aware these medications can cause nausea and constipation. Prevent  nausea by taking the medication with a snack or meal. Avoid constipation by drinking plenty of fluids and eating foods with high amount of fiber, such as fruits, vegetables, and grains. Do not take Tylenol if you are taking prescription pain medications. ° ° ° ° °Follow up °Your surgeon may want to see you in the office following your access surgery. If so, this will be arranged at the time of your surgery. ° °Please call us immediately for any of the following conditions: ° °Increased pain, redness, drainage (pus) from your incision site °Fever of 101 degrees or higher °Severe or worsening pain at your incision site °Hand pain or numbness. ° °Reduce your risk of vascular disease: ° °Stop smoking. If you would like help, call [facility] at 1-800-QUIT-NOW (1-800-784-8669) or [facility] at 336-586-4000 ° °Manage your cholesterol °Maintain a desired weight °Control your diabetes °Keep your blood pressure down ° °Dialysis ° °It will take several weeks to several months for your new dialysis access to be ready for use. Your surgeon will determine when it is OK to use it. Your nephrologist will continue to direct your dialysis. You can continue to use your Permcath until your new access is ready for use. ° °If you have any questions, please call the office at [phone]. ° °

## 2017-07-18 NOTE — Anesthesia Procedure Notes (Signed)
Procedure Name: MAC Date/Time: 07/18/2017 8:00 AM Performed by: Harden Mo, CRNA Pre-anesthesia Checklist: Patient identified, Emergency Drugs available, Suction available and Patient being monitored Patient Re-evaluated:Patient Re-evaluated prior to induction Oxygen Delivery Method: Simple face mask Preoxygenation: Pre-oxygenation with 100% oxygen Induction Type: IV induction Placement Confirmation: positive ETCO2 and breath sounds checked- equal and bilateral Dental Injury: Teeth and Oropharynx as per pre-operative assessment

## 2017-07-18 NOTE — Anesthesia Preprocedure Evaluation (Addendum)
Anesthesia Evaluation  Patient identified by MRN, date of birth, ID band Patient awake    Reviewed: Allergy & Precautions, NPO status , Patient's Chart, lab work & pertinent test results  Airway Mallampati: II  TM Distance: >3 FB Neck ROM: Full    Dental  (+) Dental Advisory Given, Edentulous Upper,    Pulmonary shortness of breath and with exertion,    Pulmonary exam normal        Cardiovascular hypertension, Pt. on medications + Peripheral Vascular Disease  Normal cardiovascular exam     Neuro/Psych PSYCHIATRIC DISORDERS Anxiety Depression CVA    GI/Hepatic GERD  Medicated and Controlled,  Endo/Other  diabetes, Type 2, Insulin DependentHypothyroidism   Renal/GU ESRF and DialysisRenal disease     Musculoskeletal  (+) Arthritis ,   Abdominal   Peds  Hematology   Anesthesia Other Findings   Reproductive/Obstetrics                           Anesthesia Physical Anesthesia Plan  ASA: III  Anesthesia Plan: MAC   Post-op Pain Management:    Induction: Intravenous  PONV Risk Score and Plan: 1 and Ondansetron and Treatment may vary due to age or medical condition  Airway Management Planned: Simple Face Mask  Additional Equipment:   Intra-op Plan:   Post-operative Plan:   Informed Consent: I have reviewed the patients History and Physical, chart, labs and discussed the procedure including the risks, benefits and alternatives for the proposed anesthesia with the patient or authorized representative who has indicated his/her understanding and acceptance.     Plan Discussed with: CRNA and Surgeon  Anesthesia Plan Comments:        Anesthesia Quick Evaluation

## 2017-07-18 NOTE — Discharge Summary (Signed)
Physician Discharge Summary   Patient ID: Jacob [name] [id number] 66 y.o. [birth year]  Admit date: 07/17/2017  Discharge date and time: 07/18/17   Admitting Physician: [name], MD   Discharge Physician: same  Admission Diagnoses: Hyperkalemia [E87.5] ESRD (end stage renal disease) ([place]) [N18.6] Thrombosis of dialysis vascular access, initial encounter [facility]) [W41.324M]  Discharge Diagnoses: same  Admission Condition: fair  Discharged Condition: fair  Indication for Admission: clotted R arm AV graft  Hospital Course: [name] is a 66 year old male who had been seen a few days prior with infiltration and bleeding from right arm hero graft cannulization site.  Hemodialysis was attempted again yesterday 12/15 however the kidney center was unable to cannulize AV graft and thought that this was an acute thrombosis.  He was admitted to the hospital on 12/15 in preparation for thrombectomy on 12/16.  He was taken to the OR this morning by Dr. Donnetta Hutching for thrombectomy of right arm hero graft.  At completion of the surgery hero graft was patent however due to the lack of pressure gradient given patient's central venous stenosis, it is likely that this access will not last long term.  If hero graft were to thrombosis or continue to have difficulty during cannulization patient will require tunneled dialysis catheter placement as well as right thigh dialysis graft.  He may follow-up in office on an as-needed basis.  He will be prescribed 2 days of narcotic pain medication for continued postoperative pain control.  Discharge instructions were reviewed with the patient and he voices his understanding.  He will be discharged this afternoon in stable condition to home.  Consults: None  Treatments: surgery: Thrombectomy of right arm hero graft by Dr. Donnetta Hutching 07/18/17  Discharge Exam: Vitals:   07/18/17 0930 07/18/17 0943  BP: (!) 166/84   Pulse: 64   Resp: 15   Temp:  97.7 F (36.5 C)  SpO2: 98%      Disposition: 01-Home or Self Care  Patient Instructions:  Allergies as of 07/18/2017      Reactions   Bee Venom Anaphylaxis   Iodine Swelling   Penicillins Swelling, Other (See Comments)   SWELLING REACTION UNSPECIFIED  Has patient had a PCN reaction causing immediate rash, facial/tongue/throat swelling, SOB or lightheadedness with hypotension: No Has patient had a PCN reaction causing severe rash involving mucus membranes or skin necrosis: No Has patient had a PCN reaction that required hospitalization No Has patient had a PCN reaction occurring within the last 10 years: No If all of the above answers are "NO", then may proceed with Cephalosporin use.   Sulfadiazine Other (See Comments)   1% Silver Sulfadiazine cream causes burning over a large area of skin.   Plavix [clopidogrel] Other (See Comments)   Caused Nose bleed      Medication List    TAKE these medications   acetaminophen 500 MG tablet Commonly known as:  TYLENOL Take 500 mg by mouth every 6 (six) hours as needed for mild pain. Do not exceed 4 gms of tylenol in 24 hours   ARTIFICIAL TEARS 0.4 % Soln Generic drug:  Hypromellose Place 1 drop into both eyes every 6 (six) hours as needed (dry eyes).   aspirin EC 81 MG tablet Take 81 mg by mouth daily.   atorvastatin 40 MG tablet Commonly known as:  LIPITOR Take 1 tablet (40 mg total) by mouth daily at 6 PM.   bisacodyl 5 MG EC tablet Commonly known as:  DULCOLAX Take 2 tablets (  10 mg total) by mouth daily as needed for moderate constipation.   carvedilol 12.5 MG tablet Commonly known as:  COREG Take 1 tablet (12.5 mg total) by mouth See admin instructions. Take 1 tablet (12.5 mg) by mouth twice daily on Sunday, Tuesday, Thursday, Saturday (hold on dialysis days)   cinacalcet 30 MG tablet Commonly known as:  SENSIPAR Take 30 mg by mouth daily with breakfast.   DIALYVITE PO Take 1 tablet by mouth every Monday, Wednesday, and Friday.   EPIPEN 2-PAK 0.3  mg/0.3 mL Soaj injection Generic drug:  EPINEPHrine Inject 0.3 mg into the muscle once as needed (anaphylaxis).   finasteride 5 MG tablet Commonly known as:  PROSCAR Take 1 tablet (5 mg total) by mouth daily.   HYDROcodone-acetaminophen 5-325 MG tablet Commonly known as:  NORCO/VICODIN Take 1 tablet by mouth every 4 (four) hours as needed for moderate pain. What changed:  when to take this   LANTUS SOLOSTAR 100 UNIT/ML Solostar Pen Generic drug:  Insulin Glargine Inject 15 Units into the skin at bedtime. What changed:  how much to take   omeprazole 40 MG capsule Commonly known as:  PRILOSEC Take 40 mg by mouth daily.   oxyCODONE-acetaminophen 5-325 MG tablet Commonly known as:  ROXICET Take 1-2 tablets by mouth every 6 (six) hours as needed. What changed:  Another medication with the same name was added. Make sure you understand how and when to take each.   oxyCODONE-acetaminophen 5-325 MG tablet Commonly known as:  ROXICET Take 1-2 tablets by mouth every 6 (six) hours as needed for moderate pain. What changed:  You were already taking a medication with the same name, and this prescription was added. Make sure you understand how and when to take each.   polyethylene glycol powder powder Commonly known as:  GLYCOLAX/MIRALAX Take 17 g by mouth daily. What changed:  additional instructions   senna 8.6 MG Tabs tablet Commonly known as:  SENOKOT Take 2 tablets (17.2 mg total) by mouth at bedtime.   sevelamer carbonate 800 MG tablet Commonly known as:  RENVELA Take 2,400 mg by mouth 3 (three) times daily with meals.   SPS 15 GM/60ML suspension Generic drug:  sodium polystyrene Take 15 g by mouth once.      Activity: activity as tolerated Diet: regular diet Wound Care: as directed  Follow-up with as needed with vascular surgery  Signed: Dagoberto Ligas 07/18/2017 11:59 AM

## 2017-07-19 ENCOUNTER — Encounter (HOSPITAL_COMMUNITY): Payer: Self-pay | Admitting: Vascular Surgery

## 2017-07-19 DIAGNOSIS — D509 Iron deficiency anemia, unspecified: Secondary | ICD-10-CM | POA: Diagnosis not present

## 2017-07-19 DIAGNOSIS — Z992 Dependence on renal dialysis: Secondary | ICD-10-CM | POA: Diagnosis not present

## 2017-07-19 DIAGNOSIS — N2581 Secondary hyperparathyroidism of renal origin: Secondary | ICD-10-CM | POA: Diagnosis not present

## 2017-07-19 DIAGNOSIS — N186 End stage renal disease: Secondary | ICD-10-CM | POA: Diagnosis not present

## 2017-07-19 DIAGNOSIS — D631 Anemia in chronic kidney disease: Secondary | ICD-10-CM | POA: Diagnosis not present

## 2017-07-19 LAB — GLUCOSE, CAPILLARY: GLUCOSE-CAPILLARY: 77 mg/dL (ref 65–99)

## 2017-07-20 DIAGNOSIS — E785 Hyperlipidemia, unspecified: Secondary | ICD-10-CM | POA: Diagnosis not present

## 2017-07-20 DIAGNOSIS — Z1389 Encounter for screening for other disorder: Secondary | ICD-10-CM | POA: Diagnosis not present

## 2017-07-20 DIAGNOSIS — E1151 Type 2 diabetes mellitus with diabetic peripheral angiopathy without gangrene: Secondary | ICD-10-CM | POA: Diagnosis not present

## 2017-07-20 DIAGNOSIS — Z4781 Encounter for orthopedic aftercare following surgical amputation: Secondary | ICD-10-CM | POA: Diagnosis not present

## 2017-07-20 DIAGNOSIS — I120 Hypertensive chronic kidney disease with stage 5 chronic kidney disease or end stage renal disease: Secondary | ICD-10-CM | POA: Diagnosis not present

## 2017-07-20 DIAGNOSIS — D631 Anemia in chronic kidney disease: Secondary | ICD-10-CM | POA: Diagnosis not present

## 2017-07-20 DIAGNOSIS — Z0000 Encounter for general adult medical examination without abnormal findings: Secondary | ICD-10-CM | POA: Diagnosis not present

## 2017-07-20 DIAGNOSIS — Z6835 Body mass index (BMI) 35.0-35.9, adult: Secondary | ICD-10-CM | POA: Diagnosis not present

## 2017-07-20 DIAGNOSIS — E6609 Other obesity due to excess calories: Secondary | ICD-10-CM | POA: Diagnosis not present

## 2017-07-20 DIAGNOSIS — E1122 Type 2 diabetes mellitus with diabetic chronic kidney disease: Secondary | ICD-10-CM | POA: Diagnosis not present

## 2017-07-20 DIAGNOSIS — N186 End stage renal disease: Secondary | ICD-10-CM | POA: Diagnosis not present

## 2017-07-21 DIAGNOSIS — N186 End stage renal disease: Secondary | ICD-10-CM | POA: Diagnosis not present

## 2017-07-21 DIAGNOSIS — N2581 Secondary hyperparathyroidism of renal origin: Secondary | ICD-10-CM | POA: Diagnosis not present

## 2017-07-21 DIAGNOSIS — Z992 Dependence on renal dialysis: Secondary | ICD-10-CM | POA: Diagnosis not present

## 2017-07-21 DIAGNOSIS — D631 Anemia in chronic kidney disease: Secondary | ICD-10-CM | POA: Diagnosis not present

## 2017-07-21 DIAGNOSIS — D509 Iron deficiency anemia, unspecified: Secondary | ICD-10-CM | POA: Diagnosis not present

## 2017-07-23 DIAGNOSIS — D509 Iron deficiency anemia, unspecified: Secondary | ICD-10-CM | POA: Diagnosis not present

## 2017-07-23 DIAGNOSIS — N2581 Secondary hyperparathyroidism of renal origin: Secondary | ICD-10-CM | POA: Diagnosis not present

## 2017-07-23 DIAGNOSIS — Z992 Dependence on renal dialysis: Secondary | ICD-10-CM | POA: Diagnosis not present

## 2017-07-23 DIAGNOSIS — N186 End stage renal disease: Secondary | ICD-10-CM | POA: Diagnosis not present

## 2017-07-23 DIAGNOSIS — D631 Anemia in chronic kidney disease: Secondary | ICD-10-CM | POA: Diagnosis not present

## 2017-07-26 DIAGNOSIS — N186 End stage renal disease: Secondary | ICD-10-CM | POA: Diagnosis not present

## 2017-07-26 DIAGNOSIS — Z992 Dependence on renal dialysis: Secondary | ICD-10-CM | POA: Diagnosis not present

## 2017-07-26 DIAGNOSIS — N2581 Secondary hyperparathyroidism of renal origin: Secondary | ICD-10-CM | POA: Diagnosis not present

## 2017-07-26 DIAGNOSIS — D631 Anemia in chronic kidney disease: Secondary | ICD-10-CM | POA: Diagnosis not present

## 2017-07-26 DIAGNOSIS — D509 Iron deficiency anemia, unspecified: Secondary | ICD-10-CM | POA: Diagnosis not present

## 2017-07-28 DIAGNOSIS — D509 Iron deficiency anemia, unspecified: Secondary | ICD-10-CM | POA: Diagnosis not present

## 2017-07-28 DIAGNOSIS — Z992 Dependence on renal dialysis: Secondary | ICD-10-CM | POA: Diagnosis not present

## 2017-07-28 DIAGNOSIS — N2581 Secondary hyperparathyroidism of renal origin: Secondary | ICD-10-CM | POA: Diagnosis not present

## 2017-07-28 DIAGNOSIS — N186 End stage renal disease: Secondary | ICD-10-CM | POA: Diagnosis not present

## 2017-07-28 DIAGNOSIS — D631 Anemia in chronic kidney disease: Secondary | ICD-10-CM | POA: Diagnosis not present

## 2017-07-29 DIAGNOSIS — E1151 Type 2 diabetes mellitus with diabetic peripheral angiopathy without gangrene: Secondary | ICD-10-CM | POA: Diagnosis not present

## 2017-07-29 DIAGNOSIS — I120 Hypertensive chronic kidney disease with stage 5 chronic kidney disease or end stage renal disease: Secondary | ICD-10-CM | POA: Diagnosis not present

## 2017-07-29 DIAGNOSIS — E1122 Type 2 diabetes mellitus with diabetic chronic kidney disease: Secondary | ICD-10-CM | POA: Diagnosis not present

## 2017-07-29 DIAGNOSIS — Z4781 Encounter for orthopedic aftercare following surgical amputation: Secondary | ICD-10-CM | POA: Diagnosis not present

## 2017-07-29 DIAGNOSIS — N186 End stage renal disease: Secondary | ICD-10-CM | POA: Diagnosis not present

## 2017-07-29 DIAGNOSIS — D631 Anemia in chronic kidney disease: Secondary | ICD-10-CM | POA: Diagnosis not present

## 2017-07-30 DIAGNOSIS — D509 Iron deficiency anemia, unspecified: Secondary | ICD-10-CM | POA: Diagnosis not present

## 2017-07-30 DIAGNOSIS — Z992 Dependence on renal dialysis: Secondary | ICD-10-CM | POA: Diagnosis not present

## 2017-07-30 DIAGNOSIS — N2581 Secondary hyperparathyroidism of renal origin: Secondary | ICD-10-CM | POA: Diagnosis not present

## 2017-07-30 DIAGNOSIS — N186 End stage renal disease: Secondary | ICD-10-CM | POA: Diagnosis not present

## 2017-07-30 DIAGNOSIS — D631 Anemia in chronic kidney disease: Secondary | ICD-10-CM | POA: Diagnosis not present

## 2017-07-31 DIAGNOSIS — N186 End stage renal disease: Secondary | ICD-10-CM | POA: Diagnosis not present

## 2017-07-31 DIAGNOSIS — E1151 Type 2 diabetes mellitus with diabetic peripheral angiopathy without gangrene: Secondary | ICD-10-CM | POA: Diagnosis not present

## 2017-07-31 DIAGNOSIS — E1122 Type 2 diabetes mellitus with diabetic chronic kidney disease: Secondary | ICD-10-CM | POA: Diagnosis not present

## 2017-07-31 DIAGNOSIS — Z4781 Encounter for orthopedic aftercare following surgical amputation: Secondary | ICD-10-CM | POA: Diagnosis not present

## 2017-07-31 DIAGNOSIS — I120 Hypertensive chronic kidney disease with stage 5 chronic kidney disease or end stage renal disease: Secondary | ICD-10-CM | POA: Diagnosis not present

## 2017-07-31 DIAGNOSIS — D631 Anemia in chronic kidney disease: Secondary | ICD-10-CM | POA: Diagnosis not present

## 2017-08-02 DIAGNOSIS — D509 Iron deficiency anemia, unspecified: Secondary | ICD-10-CM | POA: Diagnosis not present

## 2017-08-02 DIAGNOSIS — N186 End stage renal disease: Secondary | ICD-10-CM | POA: Diagnosis not present

## 2017-08-02 DIAGNOSIS — D631 Anemia in chronic kidney disease: Secondary | ICD-10-CM | POA: Diagnosis not present

## 2017-08-02 DIAGNOSIS — Z992 Dependence on renal dialysis: Secondary | ICD-10-CM | POA: Diagnosis not present

## 2017-08-02 DIAGNOSIS — N2581 Secondary hyperparathyroidism of renal origin: Secondary | ICD-10-CM | POA: Diagnosis not present

## 2017-08-04 DIAGNOSIS — N2581 Secondary hyperparathyroidism of renal origin: Secondary | ICD-10-CM | POA: Diagnosis not present

## 2017-08-04 DIAGNOSIS — Z992 Dependence on renal dialysis: Secondary | ICD-10-CM | POA: Diagnosis not present

## 2017-08-04 DIAGNOSIS — N186 End stage renal disease: Secondary | ICD-10-CM | POA: Diagnosis not present

## 2017-08-04 DIAGNOSIS — D631 Anemia in chronic kidney disease: Secondary | ICD-10-CM | POA: Diagnosis not present

## 2017-08-04 DIAGNOSIS — E1151 Type 2 diabetes mellitus with diabetic peripheral angiopathy without gangrene: Secondary | ICD-10-CM | POA: Diagnosis not present

## 2017-08-04 DIAGNOSIS — Z4781 Encounter for orthopedic aftercare following surgical amputation: Secondary | ICD-10-CM | POA: Diagnosis not present

## 2017-08-04 DIAGNOSIS — I120 Hypertensive chronic kidney disease with stage 5 chronic kidney disease or end stage renal disease: Secondary | ICD-10-CM | POA: Diagnosis not present

## 2017-08-04 DIAGNOSIS — E1122 Type 2 diabetes mellitus with diabetic chronic kidney disease: Secondary | ICD-10-CM | POA: Diagnosis not present

## 2017-08-04 DIAGNOSIS — D509 Iron deficiency anemia, unspecified: Secondary | ICD-10-CM | POA: Diagnosis not present

## 2017-08-05 DIAGNOSIS — E1151 Type 2 diabetes mellitus with diabetic peripheral angiopathy without gangrene: Secondary | ICD-10-CM | POA: Diagnosis not present

## 2017-08-05 DIAGNOSIS — D631 Anemia in chronic kidney disease: Secondary | ICD-10-CM | POA: Diagnosis not present

## 2017-08-05 DIAGNOSIS — E1122 Type 2 diabetes mellitus with diabetic chronic kidney disease: Secondary | ICD-10-CM | POA: Diagnosis not present

## 2017-08-05 DIAGNOSIS — Z4781 Encounter for orthopedic aftercare following surgical amputation: Secondary | ICD-10-CM | POA: Diagnosis not present

## 2017-08-05 DIAGNOSIS — I120 Hypertensive chronic kidney disease with stage 5 chronic kidney disease or end stage renal disease: Secondary | ICD-10-CM | POA: Diagnosis not present

## 2017-08-05 DIAGNOSIS — N186 End stage renal disease: Secondary | ICD-10-CM | POA: Diagnosis not present

## 2017-08-06 DIAGNOSIS — D631 Anemia in chronic kidney disease: Secondary | ICD-10-CM | POA: Diagnosis not present

## 2017-08-06 DIAGNOSIS — D509 Iron deficiency anemia, unspecified: Secondary | ICD-10-CM | POA: Diagnosis not present

## 2017-08-06 DIAGNOSIS — N186 End stage renal disease: Secondary | ICD-10-CM | POA: Diagnosis not present

## 2017-08-06 DIAGNOSIS — N2581 Secondary hyperparathyroidism of renal origin: Secondary | ICD-10-CM | POA: Diagnosis not present

## 2017-08-06 DIAGNOSIS — Z992 Dependence on renal dialysis: Secondary | ICD-10-CM | POA: Diagnosis not present

## 2017-08-09 DIAGNOSIS — D631 Anemia in chronic kidney disease: Secondary | ICD-10-CM | POA: Diagnosis not present

## 2017-08-09 DIAGNOSIS — N186 End stage renal disease: Secondary | ICD-10-CM | POA: Diagnosis not present

## 2017-08-09 DIAGNOSIS — N2581 Secondary hyperparathyroidism of renal origin: Secondary | ICD-10-CM | POA: Diagnosis not present

## 2017-08-09 DIAGNOSIS — D509 Iron deficiency anemia, unspecified: Secondary | ICD-10-CM | POA: Diagnosis not present

## 2017-08-09 DIAGNOSIS — Z992 Dependence on renal dialysis: Secondary | ICD-10-CM | POA: Diagnosis not present

## 2017-08-10 ENCOUNTER — Ambulatory Visit (INDEPENDENT_AMBULATORY_CARE_PROVIDER_SITE_OTHER): Payer: Self-pay | Admitting: Vascular Surgery

## 2017-08-10 ENCOUNTER — Encounter: Payer: Self-pay | Admitting: Vascular Surgery

## 2017-08-10 VITALS — BP 182/75 | HR 59 | Temp 97.1°F | Resp 20 | Ht 74.0 in | Wt 274.0 lb

## 2017-08-10 DIAGNOSIS — N186 End stage renal disease: Secondary | ICD-10-CM

## 2017-08-10 DIAGNOSIS — D631 Anemia in chronic kidney disease: Secondary | ICD-10-CM | POA: Diagnosis not present

## 2017-08-10 DIAGNOSIS — I120 Hypertensive chronic kidney disease with stage 5 chronic kidney disease or end stage renal disease: Secondary | ICD-10-CM | POA: Diagnosis not present

## 2017-08-10 DIAGNOSIS — E1122 Type 2 diabetes mellitus with diabetic chronic kidney disease: Secondary | ICD-10-CM | POA: Diagnosis not present

## 2017-08-10 DIAGNOSIS — E1151 Type 2 diabetes mellitus with diabetic peripheral angiopathy without gangrene: Secondary | ICD-10-CM | POA: Diagnosis not present

## 2017-08-10 DIAGNOSIS — Z992 Dependence on renal dialysis: Secondary | ICD-10-CM

## 2017-08-10 DIAGNOSIS — Z4781 Encounter for orthopedic aftercare following surgical amputation: Secondary | ICD-10-CM | POA: Diagnosis not present

## 2017-08-10 NOTE — Progress Notes (Signed)
Patient name: Jacob [name] MRN: [id number] DOB: 14-Oct-1950 Sex: male  REASON FOR VISIT: Discuss hemodialysis access  HPI: Jacob [name] is a 67 y.o. male here today for continued discussion of hemodialysis access.  He currently is dialyzing via a right upper arm hero graft.  The graft portion of this is a Lucylle Foulkes access graft.  This was placed by Dr. Donzetta Matters in September 2018.  He has had 2 episodes of thrombosis of this.  Most recently I made an incision over the junction of the hero catheter and Caswell Alvillar access graft and was successfully thrombectomized this.  He has had some difficulty with access of his graft although it has remained patent since that December 16 procedure.  He is here today for further discussion of options.  Current Outpatient Medications  Medication Sig Dispense Refill  . acetaminophen (TYLENOL) 500 MG tablet Take 500 mg by mouth every 6 (six) hours as needed for mild pain. Do not exceed 4 gms of tylenol in 24 hours    . aspirin EC 81 MG tablet Take 81 mg by mouth daily.    [name] atorvastatin (LIPITOR) 40 MG tablet Take 1 tablet (40 mg total) by mouth daily at 6 PM. 30 tablet 0  . B Complex-C-Folic Acid (DIALYVITE PO) Take 1 tablet by mouth every Monday, Wednesday, and Friday.    . bisacodyl (DULCOLAX) 5 MG EC tablet Take 2 tablets (10 mg total) by mouth daily as needed for moderate constipation. 30 tablet 0  . carvedilol (COREG) 12.5 MG tablet Take 1 tablet (12.5 mg total) by mouth See admin instructions. Take 1 tablet (12.5 mg) by mouth twice daily on Sunday, Tuesday, Thursday, Saturday (hold on dialysis days)    . cinacalcet (SENSIPAR) 30 MG tablet Take 30 mg by mouth daily with breakfast.     . EPINEPHrine (EPIPEN 2-PAK) 0.3 mg/0.3 mL IJ SOAJ injection Inject 0.3 mg into the muscle once as needed (anaphylaxis).    . finasteride (PROSCAR) 5 MG tablet Take 1 tablet (5 mg total) by mouth daily. 30 tablet 0  . HYDROcodone-acetaminophen (NORCO/VICODIN)  5-325 MG tablet Take 1 tablet by mouth every 4 (four) hours as needed for moderate pain. (Patient taking differently: Take 1 tablet by mouth every 8 (eight) hours as needed for moderate pain. ) 10 tablet 0  . Hypromellose (ARTIFICIAL TEARS) 0.4 % SOLN Place 1 drop into both eyes every 6 (six) hours as needed (dry eyes).    [name] LANTUS SOLOSTAR 100 UNIT/ML Solostar Pen Inject 15 Units into the skin at bedtime. (Patient taking differently: Inject 16 Units into the skin at bedtime. ) 15 mL 0  . omeprazole (PRILOSEC) 40 MG capsule Take 40 mg by mouth daily.    [name] oxyCODONE-acetaminophen (ROXICET) 5-325 MG tablet Take 1-2 tablets by mouth every 6 (six) hours as needed. 10 tablet 0  . oxyCODONE-acetaminophen (ROXICET) 5-325 MG tablet Take 1-2 tablets by mouth every 6 (six) hours as needed for moderate pain. 8 tablet 0  . polyethylene glycol powder (GLYCOLAX/MIRALAX) powder Take 17 g by mouth daily. (Patient taking differently: Take 17 g by mouth daily. Mix in 8 oz liquid and drink) 255 g 0  . senna (SENOKOT) 8.6 MG TABS tablet Take 2 tablets (17.2 mg total) by mouth at bedtime. 120 each 0  . sevelamer carbonate (RENVELA) 800 MG tablet Take 2,400 mg by mouth 3 (three) times daily with meals.     . SPS 15 GM/60ML suspension Take 15 g by mouth once.  0   No current facility-administered medications for this visit.      PHYSICAL EXAM: Vitals:   08/10/17 0930  BP: (!) 182/75  Pulse: (!) 59  Resp: 20  Temp: (!) 97.1 F (36.2 C)  TempSrc: Oral  SpO2: 94%  Weight: 274 lb (124.3 kg)  Height: 6\' 2"  (1.88 m)    GENERAL: The patient is a well-nourished male, in no acute distress. The vital signs are documented above. He does have pulsatile nature of his graft in his upper arm.  He does have a good bruit.  No evidence of steal.  MEDICAL ISSUES: I reviewed his formal lower extremity arteriogram from May 2018.  At that time he underwent procedures for attempted limb salvage but unfortunately it had a right  below-knee amputation due to his distal disease.  He has normal aortoiliac segments and normal femoral flow on the right.  He has known upper extremity venous occlusions and his hero catheter was placed over a guidewire with known venous occlusion.  I feel that his next access option would be replacement of a catheter over his hero catheter and removal of the hero.  We then placed a right femoral loop AV Gore-Tex graft.  The patient is not interested in proceeding with this at this time and reports that they are using a smaller needle and are being successful.  Explained that if he is having inadequate dialysis or if he has rethrombosis of his access, would recommend the above plan.   [name], MD FACS [facility] Tel [phone] Pager [phone]

## 2017-08-11 DIAGNOSIS — D509 Iron deficiency anemia, unspecified: Secondary | ICD-10-CM | POA: Diagnosis not present

## 2017-08-11 DIAGNOSIS — N186 End stage renal disease: Secondary | ICD-10-CM | POA: Diagnosis not present

## 2017-08-11 DIAGNOSIS — N2581 Secondary hyperparathyroidism of renal origin: Secondary | ICD-10-CM | POA: Diagnosis not present

## 2017-08-11 DIAGNOSIS — D631 Anemia in chronic kidney disease: Secondary | ICD-10-CM | POA: Diagnosis not present

## 2017-08-11 DIAGNOSIS — Z992 Dependence on renal dialysis: Secondary | ICD-10-CM | POA: Diagnosis not present

## 2017-08-12 DIAGNOSIS — Z4781 Encounter for orthopedic aftercare following surgical amputation: Secondary | ICD-10-CM | POA: Diagnosis not present

## 2017-08-12 DIAGNOSIS — E1122 Type 2 diabetes mellitus with diabetic chronic kidney disease: Secondary | ICD-10-CM | POA: Diagnosis not present

## 2017-08-12 DIAGNOSIS — I120 Hypertensive chronic kidney disease with stage 5 chronic kidney disease or end stage renal disease: Secondary | ICD-10-CM | POA: Diagnosis not present

## 2017-08-12 DIAGNOSIS — N186 End stage renal disease: Secondary | ICD-10-CM | POA: Diagnosis not present

## 2017-08-12 DIAGNOSIS — E1151 Type 2 diabetes mellitus with diabetic peripheral angiopathy without gangrene: Secondary | ICD-10-CM | POA: Diagnosis not present

## 2017-08-12 DIAGNOSIS — D631 Anemia in chronic kidney disease: Secondary | ICD-10-CM | POA: Diagnosis not present

## 2017-08-13 DIAGNOSIS — N2581 Secondary hyperparathyroidism of renal origin: Secondary | ICD-10-CM | POA: Diagnosis not present

## 2017-08-13 DIAGNOSIS — D631 Anemia in chronic kidney disease: Secondary | ICD-10-CM | POA: Diagnosis not present

## 2017-08-13 DIAGNOSIS — D509 Iron deficiency anemia, unspecified: Secondary | ICD-10-CM | POA: Diagnosis not present

## 2017-08-13 DIAGNOSIS — N186 End stage renal disease: Secondary | ICD-10-CM | POA: Diagnosis not present

## 2017-08-13 DIAGNOSIS — Z992 Dependence on renal dialysis: Secondary | ICD-10-CM | POA: Diagnosis not present

## 2017-08-16 DIAGNOSIS — N2581 Secondary hyperparathyroidism of renal origin: Secondary | ICD-10-CM | POA: Diagnosis not present

## 2017-08-16 DIAGNOSIS — N186 End stage renal disease: Secondary | ICD-10-CM | POA: Diagnosis not present

## 2017-08-16 DIAGNOSIS — Z992 Dependence on renal dialysis: Secondary | ICD-10-CM | POA: Diagnosis not present

## 2017-08-16 DIAGNOSIS — D631 Anemia in chronic kidney disease: Secondary | ICD-10-CM | POA: Diagnosis not present

## 2017-08-16 DIAGNOSIS — D509 Iron deficiency anemia, unspecified: Secondary | ICD-10-CM | POA: Diagnosis not present

## 2017-08-18 DIAGNOSIS — D631 Anemia in chronic kidney disease: Secondary | ICD-10-CM | POA: Diagnosis not present

## 2017-08-18 DIAGNOSIS — N2581 Secondary hyperparathyroidism of renal origin: Secondary | ICD-10-CM | POA: Diagnosis not present

## 2017-08-18 DIAGNOSIS — D509 Iron deficiency anemia, unspecified: Secondary | ICD-10-CM | POA: Diagnosis not present

## 2017-08-18 DIAGNOSIS — Z992 Dependence on renal dialysis: Secondary | ICD-10-CM | POA: Diagnosis not present

## 2017-08-18 DIAGNOSIS — N186 End stage renal disease: Secondary | ICD-10-CM | POA: Diagnosis not present

## 2017-08-20 DIAGNOSIS — N186 End stage renal disease: Secondary | ICD-10-CM | POA: Diagnosis not present

## 2017-08-20 DIAGNOSIS — Z992 Dependence on renal dialysis: Secondary | ICD-10-CM | POA: Diagnosis not present

## 2017-08-20 DIAGNOSIS — D509 Iron deficiency anemia, unspecified: Secondary | ICD-10-CM | POA: Diagnosis not present

## 2017-08-20 DIAGNOSIS — D631 Anemia in chronic kidney disease: Secondary | ICD-10-CM | POA: Diagnosis not present

## 2017-08-20 DIAGNOSIS — N2581 Secondary hyperparathyroidism of renal origin: Secondary | ICD-10-CM | POA: Diagnosis not present

## 2017-08-23 DIAGNOSIS — N2581 Secondary hyperparathyroidism of renal origin: Secondary | ICD-10-CM | POA: Diagnosis not present

## 2017-08-23 DIAGNOSIS — Z992 Dependence on renal dialysis: Secondary | ICD-10-CM | POA: Diagnosis not present

## 2017-08-23 DIAGNOSIS — N186 End stage renal disease: Secondary | ICD-10-CM | POA: Diagnosis not present

## 2017-08-23 DIAGNOSIS — D509 Iron deficiency anemia, unspecified: Secondary | ICD-10-CM | POA: Diagnosis not present

## 2017-08-23 DIAGNOSIS — D631 Anemia in chronic kidney disease: Secondary | ICD-10-CM | POA: Diagnosis not present

## 2017-08-25 DIAGNOSIS — N2581 Secondary hyperparathyroidism of renal origin: Secondary | ICD-10-CM | POA: Diagnosis not present

## 2017-08-25 DIAGNOSIS — N186 End stage renal disease: Secondary | ICD-10-CM | POA: Diagnosis not present

## 2017-08-25 DIAGNOSIS — Z992 Dependence on renal dialysis: Secondary | ICD-10-CM | POA: Diagnosis not present

## 2017-08-25 DIAGNOSIS — D509 Iron deficiency anemia, unspecified: Secondary | ICD-10-CM | POA: Diagnosis not present

## 2017-08-25 DIAGNOSIS — D631 Anemia in chronic kidney disease: Secondary | ICD-10-CM | POA: Diagnosis not present

## 2017-08-27 DIAGNOSIS — D631 Anemia in chronic kidney disease: Secondary | ICD-10-CM | POA: Diagnosis not present

## 2017-08-27 DIAGNOSIS — N2581 Secondary hyperparathyroidism of renal origin: Secondary | ICD-10-CM | POA: Diagnosis not present

## 2017-08-27 DIAGNOSIS — N186 End stage renal disease: Secondary | ICD-10-CM | POA: Diagnosis not present

## 2017-08-27 DIAGNOSIS — D509 Iron deficiency anemia, unspecified: Secondary | ICD-10-CM | POA: Diagnosis not present

## 2017-08-27 DIAGNOSIS — Z992 Dependence on renal dialysis: Secondary | ICD-10-CM | POA: Diagnosis not present

## 2017-08-30 DIAGNOSIS — D509 Iron deficiency anemia, unspecified: Secondary | ICD-10-CM | POA: Diagnosis not present

## 2017-08-30 DIAGNOSIS — Z992 Dependence on renal dialysis: Secondary | ICD-10-CM | POA: Diagnosis not present

## 2017-08-30 DIAGNOSIS — N2581 Secondary hyperparathyroidism of renal origin: Secondary | ICD-10-CM | POA: Diagnosis not present

## 2017-08-30 DIAGNOSIS — N186 End stage renal disease: Secondary | ICD-10-CM | POA: Diagnosis not present

## 2017-08-30 DIAGNOSIS — D631 Anemia in chronic kidney disease: Secondary | ICD-10-CM | POA: Diagnosis not present

## 2017-09-01 DIAGNOSIS — D509 Iron deficiency anemia, unspecified: Secondary | ICD-10-CM | POA: Diagnosis not present

## 2017-09-01 DIAGNOSIS — N186 End stage renal disease: Secondary | ICD-10-CM | POA: Diagnosis not present

## 2017-09-01 DIAGNOSIS — D631 Anemia in chronic kidney disease: Secondary | ICD-10-CM | POA: Diagnosis not present

## 2017-09-01 DIAGNOSIS — Z992 Dependence on renal dialysis: Secondary | ICD-10-CM | POA: Diagnosis not present

## 2017-09-01 DIAGNOSIS — N2581 Secondary hyperparathyroidism of renal origin: Secondary | ICD-10-CM | POA: Diagnosis not present

## 2017-09-02 DIAGNOSIS — N186 End stage renal disease: Secondary | ICD-10-CM | POA: Diagnosis not present

## 2017-09-02 DIAGNOSIS — Z992 Dependence on renal dialysis: Secondary | ICD-10-CM | POA: Diagnosis not present

## 2017-09-03 DIAGNOSIS — Z992 Dependence on renal dialysis: Secondary | ICD-10-CM | POA: Diagnosis not present

## 2017-09-03 DIAGNOSIS — N2581 Secondary hyperparathyroidism of renal origin: Secondary | ICD-10-CM | POA: Diagnosis not present

## 2017-09-03 DIAGNOSIS — D631 Anemia in chronic kidney disease: Secondary | ICD-10-CM | POA: Diagnosis not present

## 2017-09-03 DIAGNOSIS — N186 End stage renal disease: Secondary | ICD-10-CM | POA: Diagnosis not present

## 2017-09-03 DIAGNOSIS — D509 Iron deficiency anemia, unspecified: Secondary | ICD-10-CM | POA: Diagnosis not present

## 2017-09-06 ENCOUNTER — Other Ambulatory Visit: Payer: Self-pay | Admitting: General Surgery

## 2017-09-06 ENCOUNTER — Other Ambulatory Visit (HOSPITAL_COMMUNITY): Payer: Self-pay | Admitting: Nephrology

## 2017-09-06 DIAGNOSIS — N186 End stage renal disease: Secondary | ICD-10-CM

## 2017-09-07 ENCOUNTER — Ambulatory Visit (HOSPITAL_COMMUNITY)
Admission: RE | Admit: 2017-09-07 | Discharge: 2017-09-07 | Disposition: A | Discharge status: Home / Self Care | Payer: Medicare Other | Source: Ambulatory Visit | Attending: Nephrology | Admitting: Nephrology

## 2017-09-07 ENCOUNTER — Other Ambulatory Visit (HOSPITAL_COMMUNITY): Payer: Self-pay | Admitting: Nephrology

## 2017-09-07 ENCOUNTER — Encounter (HOSPITAL_COMMUNITY): Payer: Self-pay

## 2017-09-07 DIAGNOSIS — N186 End stage renal disease: Secondary | ICD-10-CM

## 2017-09-07 DIAGNOSIS — Z8249 Family history of ischemic heart disease and other diseases of the circulatory system: Secondary | ICD-10-CM | POA: Insufficient documentation

## 2017-09-07 DIAGNOSIS — Z880 Allergy status to penicillin: Secondary | ICD-10-CM | POA: Diagnosis not present

## 2017-09-07 DIAGNOSIS — T82868A Thrombosis of vascular prosthetic devices, implants and grafts, initial encounter: Secondary | ICD-10-CM | POA: Insufficient documentation

## 2017-09-07 DIAGNOSIS — Z7982 Long term (current) use of aspirin: Secondary | ICD-10-CM | POA: Insufficient documentation

## 2017-09-07 DIAGNOSIS — I120 Hypertensive chronic kidney disease with stage 5 chronic kidney disease or end stage renal disease: Secondary | ICD-10-CM | POA: Insufficient documentation

## 2017-09-07 DIAGNOSIS — D631 Anemia in chronic kidney disease: Secondary | ICD-10-CM | POA: Diagnosis not present

## 2017-09-07 DIAGNOSIS — Y832 Surgical operation with anastomosis, bypass or graft as the cause of abnormal reaction of the patient, or of later complication, without mention of misadventure at the time of the procedure: Secondary | ICD-10-CM | POA: Diagnosis not present

## 2017-09-07 DIAGNOSIS — Z89511 Acquired absence of right leg below knee: Secondary | ICD-10-CM | POA: Diagnosis not present

## 2017-09-07 DIAGNOSIS — Z8673 Personal history of transient ischemic attack (TIA), and cerebral infarction without residual deficits: Secondary | ICD-10-CM | POA: Diagnosis not present

## 2017-09-07 DIAGNOSIS — E1122 Type 2 diabetes mellitus with diabetic chronic kidney disease: Secondary | ICD-10-CM | POA: Insufficient documentation

## 2017-09-07 DIAGNOSIS — K219 Gastro-esophageal reflux disease without esophagitis: Secondary | ICD-10-CM | POA: Insufficient documentation

## 2017-09-07 DIAGNOSIS — H409 Unspecified glaucoma: Secondary | ICD-10-CM | POA: Diagnosis not present

## 2017-09-07 DIAGNOSIS — F329 Major depressive disorder, single episode, unspecified: Secondary | ICD-10-CM | POA: Insufficient documentation

## 2017-09-07 DIAGNOSIS — G4700 Insomnia, unspecified: Secondary | ICD-10-CM | POA: Insufficient documentation

## 2017-09-07 DIAGNOSIS — Z794 Long term (current) use of insulin: Secondary | ICD-10-CM | POA: Insufficient documentation

## 2017-09-07 DIAGNOSIS — I428 Other cardiomyopathies: Secondary | ICD-10-CM | POA: Insufficient documentation

## 2017-09-07 DIAGNOSIS — Z882 Allergy status to sulfonamides status: Secondary | ICD-10-CM | POA: Insufficient documentation

## 2017-09-07 DIAGNOSIS — F419 Anxiety disorder, unspecified: Secondary | ICD-10-CM | POA: Diagnosis not present

## 2017-09-07 HISTORY — PX: IR US GUIDE VASC ACCESS RIGHT: IMG2390

## 2017-09-07 HISTORY — PX: IR THROMBECTOMY AV FISTULA W/THROMBOLYSIS/PTA/STENT INC/SHUNT/IMG RT: IMG6120

## 2017-09-07 LAB — BASIC METABOLIC PANEL
ANION GAP: 20 — AB (ref 5–15)
BUN: 73 mg/dL — ABNORMAL HIGH (ref 6–20)
CALCIUM: 8.3 mg/dL — AB (ref 8.9–10.3)
CO2: 20 mmol/L — AB (ref 22–32)
Chloride: 98 mmol/L — ABNORMAL LOW (ref 101–111)
Creatinine, Ser: 10.85 mg/dL — ABNORMAL HIGH (ref 0.61–1.24)
GFR, EST AFRICAN AMERICAN: 5 mL/min — AB (ref >60)
GFR, EST NON AFRICAN AMERICAN: 4 mL/min — AB (ref >60)
GLUCOSE: 115 mg/dL — AB (ref 65–99)
Potassium: 4.9 mmol/L (ref 3.5–5.1)
Sodium: 138 mmol/L (ref 135–145)

## 2017-09-07 LAB — GLUCOSE, CAPILLARY: Glucose-Capillary: 110 mg/dL — ABNORMAL HIGH (ref 65–99)

## 2017-09-07 LAB — CBC
HCT: 35.8 % — ABNORMAL LOW (ref 39.0–52.0)
HEMOGLOBIN: 11.2 g/dL — AB (ref 13.0–17.0)
MCH: 30 pg (ref 26.0–34.0)
MCHC: 31.3 g/dL (ref 30.0–36.0)
MCV: 96 fL (ref 78.0–100.0)
Platelets: 214 10*3/uL (ref 150–400)
RBC: 3.73 MIL/uL — ABNORMAL LOW (ref 4.22–5.81)
RDW: 17.8 % — AB (ref 11.5–15.5)
WBC: 4.9 10*3/uL (ref 4.0–10.5)

## 2017-09-07 LAB — PROTIME-INR
INR: 1.4
PROTHROMBIN TIME: 17 s — AB (ref 11.4–15.2)

## 2017-09-07 MED ORDER — LIDOCAINE HCL (PF) 1 % IJ SOLN
INTRAMUSCULAR | Status: AC | PRN
  Administered 2017-09-07: 10 mL

## 2017-09-07 MED ORDER — IOPAMIDOL (ISOVUE-300) INJECTION 61%
INTRAVENOUS | Status: AC
  Administered 2017-09-07: 50 mL
  Filled 2017-09-07: qty 100

## 2017-09-07 MED ORDER — FENTANYL CITRATE (PF) 100 MCG/2ML IJ SOLN
INTRAMUSCULAR | Status: AC | PRN
  Administered 2017-09-07: 50 ug via INTRAVENOUS
  Administered 2017-09-07 (×2): 25 ug via INTRAVENOUS

## 2017-09-07 MED ORDER — ALTEPLASE 2 MG IJ SOLR
INTRAMUSCULAR | Status: AC
  Filled 2017-09-07: qty 2

## 2017-09-07 MED ORDER — MIDAZOLAM HCL 2 MG/2ML IJ SOLN
INTRAMUSCULAR | Status: AC | PRN
  Administered 2017-09-07: 1 mg via INTRAVENOUS
  Administered 2017-09-07 (×2): 0.5 mg via INTRAVENOUS

## 2017-09-07 MED ORDER — FENTANYL CITRATE (PF) 100 MCG/2ML IJ SOLN
INTRAMUSCULAR | Status: AC
  Filled 2017-09-07: qty 4

## 2017-09-07 MED ORDER — SODIUM CHLORIDE 0.9 % IV SOLN
INTRAVENOUS | Status: DC
  Administered 2017-09-07: 13:00:00 via INTRAVENOUS

## 2017-09-07 MED ORDER — HEPARIN SODIUM (PORCINE) 1000 UNIT/ML IJ SOLN
INTRAMUSCULAR | Status: AC
  Administered 2017-09-07: 3000 [IU] via INTRAVENOUS
  Filled 2017-09-07: qty 1

## 2017-09-07 MED ORDER — HEPARIN SODIUM (PORCINE) 1000 UNIT/ML IJ SOLN
3000.0000 [IU] | Freq: Once | INTRAMUSCULAR | Status: AC
  Administered 2017-09-07: 3000 [IU] via INTRAVENOUS

## 2017-09-07 MED ORDER — LIDOCAINE HCL 1 % IJ SOLN
INTRAMUSCULAR | Status: AC
  Filled 2017-09-07: qty 20

## 2017-09-07 MED ORDER — MIDAZOLAM HCL 2 MG/2ML IJ SOLN
INTRAMUSCULAR | Status: AC
  Filled 2017-09-07: qty 4

## 2017-09-07 NOTE — Sedation Documentation (Signed)
Patient is resting comfortably. 

## 2017-09-07 NOTE — H&P (Signed)
Chief Complaint: Patient was seen in consultation today for right upper extremity HERO graft declot at the request of [place]  Referring Physician(s): Fran Lowes  Supervising Physician: Aletta Edouard  Patient Status: [name]  History of Present Illness: Jacob [name] is a 67 y.o. male   ESRD HERO graft placed Dr Donzetta Matters 05/2017 Has had 2 thrombectomies of this graft per notes Last use Fri 2/1--- no issues Mon graft was clotted per RN Here today for thrombolysis with possible angioplasty/stent placement Possible tunneled dialysis catheter placement Per Dr Early note 08/10/17:   I feel that his next access option would be replacement of a catheter over his hero catheter and removal of the hero.  We then placed a right femoral loop AV Gore-Tex graft.  The patient is not interested in proceeding with this at this time and reports that they are using a smaller needle and are being successful.  Explained that if he is having inadequate dialysis or if he has rethrombosis of his access, would recommend the above plan.   Past Medical History:  Diagnosis Date  . Anemia   . Anemia in chronic kidney disease(285.21)   . Anxiety   . Arthritis   . Depression   . Dyspnea    with exertion  . ESRD (end stage renal disease) on dialysis [facility])    "MWF; [facility] [place]" (02/18/2017)  . Essential hypertension   . GERD (gastroesophageal reflux disease)   . Glaucoma   . History of blood transfusion   . Insomnia   . Nonischemic cardiomyopathy ([place])   . Stroke [facility]) 2016   - "they said it was not a stroke"  . Type 2 diabetes mellitus ([place])    Type II    Past Surgical History:  Procedure Laterality Date  . A/V FISTULAGRAM Right 10/06/2016   Procedure: A/V Fistulagram;  Surgeon: [name], MD;  Location: [place] CV LAB;  Service: Cardiovascular;  Laterality: Right;  . A/V FISTULAGRAM Right 04/27/2017   Procedure: A/V Fistulagram;  Surgeon: [name], MD;   Location: [place] CV LAB;  Service: Cardiovascular;  Laterality: Right;  . ABDOMINAL AORTOGRAM N/A 12/30/2016   Procedure: Abdominal Aortogram;  Surgeon: Waynetta Sandy, MD;  Location: [place] CV [facility];  Service: Cardiovascular;  Laterality: N/A;  . ABDOMINAL AORTOGRAM N/A 04/08/2017   Procedure: ABDOMINAL AORTOGRAM;  Surgeon: Conrad [place], MD;  Location: [place] CV LAB;  Service: Cardiovascular;  Laterality: N/A;  . ABDOMINAL AORTOGRAM W/LOWER EXTREMITY N/A 02/23/2017   Procedure: Abdominal Aortogram w/Lower Extremity;  Surgeon: [name], MD;  Location: [place] CV LAB;  Service: Cardiovascular;  Laterality: N/A;  Rt. leg  . AMPUTATION Right 02/28/2017   Procedure: RIGHT BELOW KNEE AMPUTATION;  Surgeon: [name], MD;  Location: [place];  Service: Vascular;  Laterality: Right;  . AV FISTULA PLACEMENT     Hx: of  . AV FISTULA PLACEMENT Right 05/04/2013   Procedure: ARTERIOVENOUS (AV) FISTULA CREATION- RIGHT ARM;  Surgeon: Conrad Elbert, MD;  Location: [place];  Service: Vascular;  Laterality: Right;  Ultrasound guided  . AV FISTULA PLACEMENT Right 07/28/2016   Procedure: BRACHIOCEPHALIC ARTERIOVENOUS (AV) FISTULA CREATION;  Surgeon: [name], MD;  Location: [place];  Service: Vascular;  Laterality: Right;  . BACK SURGERY    . CATARACT EXTRACTION W/PHACO Left 07/16/2014   Procedure: CATARACT EXTRACTION PHACO AND INTRAOCULAR LENS PLACEMENT (IOC);  Surgeon: [name], MD;  Location: [facility];  Service:  Ophthalmology;  Laterality: Left;  CDE:8.86  . CATARACT EXTRACTION W/PHACO Right 07/30/2014   Procedure: CATARACT EXTRACTION PHACO AND INTRAOCULAR LENS PLACEMENT (IOC);  Surgeon: [name], MD;  Location: [facility];  Service: Ophthalmology;  Laterality: Right;  CDE 8.99  . COLONOSCOPY     Hx: of  . EYE SURGERY    . FISTULA SUPERFICIALIZATION Right 07/28/2016   Procedure: FISTULA SUPERFICIALIZATION;  Surgeon: [name], MD;  Location: [place];  Service:  Vascular;  Laterality: Right;  . FISTULA SUPERFICIALIZATION Right 10/13/2016   Procedure: BRACHIOCEPHALIC ARTERIOVENOUS FISTULA SUPERFICIALIZATION;  Surgeon: [name], MD;  Location: [place];  Service: Vascular;  Laterality: Right;  . FISTULOGRAM N/A 05/04/2013   Procedure: CENTRAL VENOGRAM;  Surgeon: Conrad Roberts, MD;  Location: [place];  Service: Vascular;  Laterality: N/A;  . INSERTION OF DIALYSIS CATHETER Right 05/04/2013   Procedure: INSERTION OF DIALYSIS CATHETER;  Surgeon: Conrad [place], MD;  Location: [place];  Service: Vascular;  Laterality: Right;  Ultrasound guided  . INSERTION OF DIALYSIS CATHETER N/A 07/28/2016   Procedure: INSERTION OF DIALYSIS CATHETER;  Surgeon: [name], MD;  Location: [place];  Service: Vascular;  Laterality: N/A;  . IR FLUORO GUIDE CV LINE RIGHT  04/13/2017  . IR US GUIDE VASC ACCESS RIGHT  04/13/2017  . LIGATION OF ARTERIOVENOUS  FISTULA Left 05/04/2013   Procedure: LIGATION OF LEFT RADIAL CEPHALIC ARTERIOVENOUS  FISTULA;  Surgeon: Conrad Glenwood, MD;  Location: [place];  Service: Vascular;  Laterality: Left;  Ultrasound guided  . LIGATION OF ARTERIOVENOUS  FISTULA Right 07/28/2016   Procedure: LIGATION OF RADIOCEPHALIC ARTERIOVENOUS  FISTULA;  Surgeon: [name], MD;  Location: [place];  Service: Vascular;  Laterality: Right;  . LOWER EXTREMITY ANGIOGRAPHY N/A 12/30/2016   Procedure: Lower Extremity Angiography;  Surgeon: Waynetta Sandy, MD;  Location: [place] CV LAB;  Service: Cardiovascular;  Laterality: N/A;  . LUMBAR SPINE SURGERY    . PERIPHERAL VASCULAR ATHERECTOMY Right 12/30/2016   Procedure: Peripheral Vascular Atherectomy;  Surgeon: Waynetta Sandy, MD;  Location: [place] CV LAB;  Service: Cardiovascular;  Laterality: Right;  AT and PT  . PERIPHERAL VASCULAR BALLOON ANGIOPLASTY Right 12/30/2016   Procedure: Peripheral Vascular Balloon Angioplasty;  Surgeon: Waynetta Sandy, MD;  Location: [place] CV LAB;  Service: Cardiovascular;  Laterality: Right;  PTA of PT and AT  . PERIPHERAL VASCULAR BALLOON ANGIOPLASTY  02/23/2017   Procedure: Peripheral Vascular Balloon Angioplasty;  Surgeon: [name], MD;  Location: [place] CV LAB;  Service: Cardiovascular;;  RT. Anterior Tib.  [name] PERIPHERAL VASCULAR BALLOON ANGIOPLASTY  04/08/2017   Procedure: PERIPHERAL VASCULAR BALLOON ANGIOPLASTY;  Surgeon: Conrad Delano, MD;  Location: [place] [facility];  Service: Cardiovascular;;  . PERIPHERAL VASCULAR CATHETERIZATION N/A 01/24/2015   Procedure: Fistulagram;  Surgeon: Conrad Victoria, MD;  Location: [place] CV LAB;  Service: Cardiovascular;  Laterality: N/A;  . PERIPHERAL VASCULAR CATHETERIZATION Right 06/04/2016   Procedure: A/V Shuntogram/Fistulagram;  Surgeon: Conrad [place], MD;  Location: [place] [facility];  Service: Cardiovascular;  Laterality: Right;  . REVISON OF ARTERIOVENOUS FISTULA Right 01/02/2014   Procedure: REVISON OF ARTERIOVENOUS FISTULA ANASTOMOSIS;  Surgeon: Conrad Wausa, MD;  Location: [place];  Service: Vascular;  Laterality: Right;  . REVISON OF ARTERIOVENOUS FISTULA Right 01/02/2016   Procedure: REVISION OF RADIOCEPHALIC ARTERIOVENOUS FISTULA  with BOVINE PATCH ANGIOPLASTY RIGHT RADIAL ARTERY;  Surgeon: Mal [name], MD;  Location: [place];  Service: Vascular;  Laterality: Right;  . SHUNTOGRAM N/A 11/07/2013   Procedure: Fistulogram;  Surgeon: [name], MD;  Location: [facility];  Service: Cardiovascular;  Laterality: N/A;  . THROMBECTOMY W/ EMBOLECTOMY Right 07/18/2017   Procedure: THROMBECTOMY ARTERIOVENOUS HERO GRAFT ARM;  Surgeon: [name], MD;  Location: [place];  Service: Vascular;  Laterality: Right;  . TRANSMETATARSAL AMPUTATION Right 01/01/2017   Procedure: TRANSMETATARSAL AMPUTATION;  Surgeon: [name], MD;  Location: [place];  Service: Vascular;  Laterality: Right;  [name] VASCULAR ACCESS DEVICE INSERTION Right 05/13/2017   Procedure: INSERTION OF HERO  VASCULAR ACCESS DEVICE RIGHT UPPER ARM;  Surgeon: Waynetta Sandy, MD;  Location: [place];  Service: Vascular;  Laterality: Right;  . VENOGRAM N/A 05/13/2017   Procedure: CENTRAL VENOGRAM;  Surgeon: Waynetta Sandy, MD;  Location: [facility] OR;  Service: Vascular;  Laterality: N/A;    Allergies: Bee venom; Iodine; Penicillins; Sulfadiazine; and Plavix [clopidogrel]  Medications: Prior to Admission medications   Medication Sig Start Date End Date Taking? Authorizing Provider  acetaminophen (TYLENOL) 500 MG tablet Take 500 mg by mouth every 6 (six) hours as needed for mild pain. Do not exceed 4 gms of tylenol in 24 hours   Yes [provider]  aspirin EC 81 MG tablet Take 81 mg by mouth daily.   Yes [provider]  atorvastatin (LIPITOR) 40 MG tablet Take 1 tablet (40 mg total) by mouth daily at 6 PM. 02/26/17  Yes Short, Noah Delaine, MD  B Complex-C-Folic Acid (DIALYVITE PO) Take 1 tablet by mouth every Monday, Wednesday, and Friday.   Yes [provider]  bisacodyl (DULCOLAX) 5 MG EC tablet Take 2 tablets (10 mg total) by mouth daily as needed for moderate constipation. 01/04/17  Yes Mikhail, Maryann, DO  carvedilol (COREG) 12.5 MG tablet Take 1 tablet (12.5 mg total) by mouth See admin instructions. Take 1 tablet (12.5 mg) by mouth twice daily on Sunday, Tuesday, Thursday, Saturday (hold on dialysis days) 04/10/17  Yes Barton Dubois, MD  cinacalcet (SENSIPAR) 30 MG tablet Take 30 mg by mouth daily with breakfast.    Yes [provider]  EPINEPHrine (EPIPEN 2-PAK) 0.3 mg/0.3 mL IJ SOAJ injection Inject 0.3 mg into the muscle once as needed (anaphylaxis).   Yes [provider]  finasteride (PROSCAR) 5 MG tablet Take 1 tablet (5 mg total) by mouth daily. 02/27/17  Yes Short, Noah Delaine, MD  Hypromellose (ARTIFICIAL TEARS) 0.4 % SOLN Place 1 drop into both eyes every 6 (six) hours as needed (dry eyes).   Yes [provider]  LANTUS SOLOSTAR  100 UNIT/ML Solostar Pen Inject 15 Units into the skin at bedtime. Patient taking differently: Inject 16 Units into the skin at bedtime.  03/02/17  Yes Short, Noah Delaine, MD  omeprazole (PRILOSEC) 40 MG capsule Take 40 mg by mouth daily.   Yes [provider]  oxyCODONE-acetaminophen (ROXICET) 5-325 MG tablet Take 1-2 tablets by mouth every 6 (six) hours as needed. 05/13/17 05/13/18 Yes Waynetta Sandy, MD  polyethylene glycol powder [facility]) powder Take 17 g by mouth daily. Patient taking differently: Take 17 g by mouth daily. Mix in 8 oz liquid and drink 02/26/17  Yes Short, Noah Delaine, MD  senna (SENOKOT) 8.6 MG TABS tablet Take 2 tablets (17.2 mg total) by mouth at bedtime. 02/26/17  Yes Short, Noah Delaine, MD  sevelamer carbonate (RENVELA) 800 MG tablet Take 2,400 mg by mouth 3 (three) times daily with meals.    Yes [provider]  HYDROcodone-acetaminophen (NORCO/VICODIN) 5-325 MG tablet Take 1 tablet by mouth every 4 (four) hours as needed for moderate pain. Patient not taking: Reported on 09/07/2017 02/26/17   Janece Canterbury, MD  oxyCODONE-acetaminophen (ROXICET) 5-325 MG tablet Take 1-2 tablets by mouth every 6 (six) hours as needed for moderate pain. 07/18/17   Dagoberto Ligas, PA-C     Family History  Problem Relation Age of Onset  . Heart attack Brother        56  . Heart disease Brother        before age 54  . Hypertension Brother   . Heart attack Brother        46  . Heart attack Brother        57  . Diabetes Mother   . Hypertension Mother   . Heart disease Mother   . Heart disease Father   . Hypertension Father   . Other Father        amputation  . Hypertension Sister   . Heart disease Sister   . Vision loss Maternal Uncle     Social History   Socioeconomic History  . Marital status: Divorced    Spouse name: None  . Number of children: None  . Years of education: None  . Highest education level: None  Social Needs  .  Financial resource strain: None  . Food insecurity - worry: None  . Food insecurity - inability: None  . Transportation needs - medical: None  . Transportation needs - non-medical: None  Occupational History  . None  Tobacco Use  . Smoking status: Never Smoker  . Smokeless tobacco: Never Used  Substance and Sexual Activity  . Alcohol use: Yes    Comment: 1- fifth of gin a week  . Drug use: No  . Sexual activity: Yes    Birth control/protection: None  Other Topics Concern  . None  Social History Narrative  . None    Review of Systems: A 12 point ROS discussed and pertinent positives are indicated in the HPI above.  All other systems are negative.  Review of Systems  Constitutional: Positive for fatigue. Negative for activity change and fever.  Respiratory: Negative for cough and shortness of breath.   Cardiovascular: Negative for chest pain.  Gastrointestinal: Negative for abdominal pain.  Neurological: Positive for weakness.  Psychiatric/Behavioral: Negative for behavioral problems and confusion.    Vital Signs: BP (!) 192/95 (BP Location: Right Arm)   Pulse (!) 59   Temp 98 F (36.7 C) (Oral)   Ht 6\' 2"  (1.88 m)   Wt 265 lb (120.2 kg)   SpO2 100%   BMI 34.02 kg/m   Physical Exam  Constitutional: He is oriented to person, place, and time.  Cardiovascular: Normal rate, regular rhythm and normal heart sounds.  Pulmonary/Chest: Effort normal and breath sounds normal.  Abdominal: Soft. Bowel sounds are normal.  Musculoskeletal: Normal range of motion.  RUE graft - no pulses; no thrill  Neurological: He is alert and oriented to person, place, and time.  Skin: Skin is warm and dry.  Psychiatric: He has a normal mood and affect. His behavior is normal. Judgment and thought content normal.  Nursing note and vitals reviewed.   Imaging: No results found.  Labs:  CBC: Recent Labs    07/16/17 1620 07/17/17 1945 07/17/17 2213 07/18/17 0529  WBC 4.8 5.4 5.2 5.0    HGB 11.9* 11.0* 11.2* 11.0*  HCT 38.5* 35.6* 35.7* 35.9*  PLT  176 213 190 189    COAGS: Recent Labs    04/09/17 0101 04/13/17 1315 05/13/17 0939 07/18/17 0529  INR 1.71 1.53 1.35 1.35  APTT  --  47* 43*  --     BMP: Recent Labs    04/13/17 1315 04/27/17 0638 05/13/17 0942 07/16/17 1528 07/17/17 1529 07/18/17 0529  NA 134* 137 140 136 135 136  K 5.7* 4.4 4.0 4.8 5.0 4.8  CL 97* 95*  --  98* 100* 97*  CO2 22  --   --  26 21* 24  GLUCOSE 94 104* 79 156* 139* 91  BUN 116* 40*  --  57* 67* 76*  CALCIUM 8.5*  --   --  7.9* 7.4* 7.8*  CREATININE 14.61* 5.50*  --  9.32* 10.56* 11.29*  GFRNONAA 3*  --   --  5* 4* 4*  GFRAA 3*  --   --  6* 5* 5*    LIVER FUNCTION TESTS: Recent Labs    02/18/17 1821  02/22/17 0310  02/28/17 1341 03/03/17 0724 04/09/17 0101 07/18/17 0529  BILITOT 0.4  --  0.9  --   --   --  0.6 1.2  AST 19  --  26  --   --   --  17 25  ALT 9*  --  9*  --   --   --  13* 16*  ALKPHOS 184*  --  193*  --   --   --  156* 276*  PROT 8.1  --  7.9  --   --   --  8.7* 8.0  ALBUMIN 2.2*   < > 1.9*   < > 2.0* 1.9* 2.7* 3.2*   < > = values in this interval not displayed.    TUMOR MARKERS: No results for input(s): AFPTM, CEA, CA199, CHROMGRNA in the last 8760 hours.  Assessment and Plan:  Clotted RUE HERO graft Scheduled for thrombolysis with intervention--possible dialysis catheter placement Risks and benefits discussed with the patient including, but not limited to bleeding, infection, vascular injury, pulmonary embolism, need for tunneled HD catheter placement or even death.  All of the patient's questions were answered, patient is agreeable to proceed. Consent signed and in chart.  Thank you for this interesting consult.  I greatly enjoyed meeting SHAHZAIB AZEVEDO and look forward to participating in their care.  A copy of this report was sent to the requesting provider on this date.  Electronically Signed: Lavonia Drafts, PA-C 09/07/2017, 11:37  AM   I spent a total of  30 Minutes   in face to face in clinical consultation, greater than 50% of which was counseling/coordinating care for RUE HERO graft declot

## 2017-09-07 NOTE — Procedures (Signed)
Interventional Radiology Procedure Note  Procedure: Thrombectomy of right upper arm HeRO graft with angioplasty and stenting  Complications: None  Estimated Blood Loss: 10 mL  Findings: After thrombectomy, segment of PTFE portion of HeRO uncovered demonstrating damage, infolding and irregular resistant stenosis that did not respond to balloon angioplasty.  Treated with Flair covered stents.  First 9x40 mm Flair jumped forward, necessitating placement of second, overlapping 9x40 mm Flair to treat damaged segment of graft.  HeRO widely patent on completion.  Venetia Night. Kathlene Cote, M.D Pager:  [phone]

## 2017-09-07 NOTE — Discharge Instructions (Signed)

## 2017-09-08 DIAGNOSIS — D509 Iron deficiency anemia, unspecified: Secondary | ICD-10-CM | POA: Diagnosis not present

## 2017-09-08 DIAGNOSIS — Z992 Dependence on renal dialysis: Secondary | ICD-10-CM | POA: Diagnosis not present

## 2017-09-08 DIAGNOSIS — D631 Anemia in chronic kidney disease: Secondary | ICD-10-CM | POA: Diagnosis not present

## 2017-09-08 DIAGNOSIS — N2581 Secondary hyperparathyroidism of renal origin: Secondary | ICD-10-CM | POA: Diagnosis not present

## 2017-09-08 DIAGNOSIS — N186 End stage renal disease: Secondary | ICD-10-CM | POA: Diagnosis not present

## 2017-09-10 DIAGNOSIS — D631 Anemia in chronic kidney disease: Secondary | ICD-10-CM | POA: Diagnosis not present

## 2017-09-10 DIAGNOSIS — Z992 Dependence on renal dialysis: Secondary | ICD-10-CM | POA: Diagnosis not present

## 2017-09-10 DIAGNOSIS — N2581 Secondary hyperparathyroidism of renal origin: Secondary | ICD-10-CM | POA: Diagnosis not present

## 2017-09-10 DIAGNOSIS — N186 End stage renal disease: Secondary | ICD-10-CM | POA: Diagnosis not present

## 2017-09-10 DIAGNOSIS — D509 Iron deficiency anemia, unspecified: Secondary | ICD-10-CM | POA: Diagnosis not present

## 2017-09-13 DIAGNOSIS — N2581 Secondary hyperparathyroidism of renal origin: Secondary | ICD-10-CM | POA: Diagnosis not present

## 2017-09-13 DIAGNOSIS — D631 Anemia in chronic kidney disease: Secondary | ICD-10-CM | POA: Diagnosis not present

## 2017-09-13 DIAGNOSIS — N186 End stage renal disease: Secondary | ICD-10-CM | POA: Diagnosis not present

## 2017-09-13 DIAGNOSIS — Z992 Dependence on renal dialysis: Secondary | ICD-10-CM | POA: Diagnosis not present

## 2017-09-13 DIAGNOSIS — D509 Iron deficiency anemia, unspecified: Secondary | ICD-10-CM | POA: Diagnosis not present

## 2017-09-15 DIAGNOSIS — D631 Anemia in chronic kidney disease: Secondary | ICD-10-CM | POA: Diagnosis not present

## 2017-09-15 DIAGNOSIS — N2581 Secondary hyperparathyroidism of renal origin: Secondary | ICD-10-CM | POA: Diagnosis not present

## 2017-09-15 DIAGNOSIS — Z992 Dependence on renal dialysis: Secondary | ICD-10-CM | POA: Diagnosis not present

## 2017-09-15 DIAGNOSIS — N186 End stage renal disease: Secondary | ICD-10-CM | POA: Diagnosis not present

## 2017-09-15 DIAGNOSIS — D509 Iron deficiency anemia, unspecified: Secondary | ICD-10-CM | POA: Diagnosis not present

## 2017-09-15 NOTE — Progress Notes (Signed)
Cardiology Office Note  Date: 09/16/2017   ID: Jacob [name], DOB [birth year], MRN [id number]  PCP: Sharilyn Sites, MD  Primary Cardiologist: Rozann Lesches, MD   Chief Complaint  Patient presents with  . History of cardiomyopathy    History of Present Illness: Jacob [name] is a 67 y.o. male last seen in February 2018.  He presents for a routine follow-up visit.  Reports no exertional chest pain or increasing shortness of breath.  Continues to get around with a right leg prosthesis and also a walker.  No falls.  Follow-up echocardiogram in July 2018 revealed LVEF 60-65% range.  We went over these results today.  He has continued on Coreg and Altace.  He continues on hemodialysis with follow-up per Dr. Lowanda Foster.  He has had recurring access problems, recently underwent thrombectomy with angioplasty/stenting of right arm HeRO graft.  Past Medical History:  Diagnosis Date  . Anemia in chronic kidney disease(285.21)   . Anxiety   . Arthritis   . Depression   . ESRD (end stage renal disease) on dialysis [facility])    "MWF; [facility] [place]" (02/18/2017)  . Essential hypertension   . GERD (gastroesophageal reflux disease)   . Glaucoma   . History of blood transfusion   . History of stroke 2016  . Insomnia   . Nonischemic cardiomyopathy ([place])   . Type 2 diabetes mellitus ([place])     Past Surgical History:  Procedure Laterality Date  . A/V FISTULAGRAM Right 10/06/2016   Procedure: A/V Fistulagram;  Surgeon: [name], MD;  Location: [place] [facility];  Service: Cardiovascular;  Laterality: Right;  . A/V FISTULAGRAM Right 04/27/2017   Procedure: A/V Fistulagram;  Surgeon: [name], MD;  Location: [place] [facility];  Service: Cardiovascular;  Laterality: Right;  . ABDOMINAL AORTOGRAM N/A 12/30/2016   Procedure: Abdominal Aortogram;  Surgeon: Waynetta Sandy, MD;  Location: [place] [facility];  Service: Cardiovascular;  Laterality: N/A;  . ABDOMINAL AORTOGRAM N/A  04/08/2017   Procedure: ABDOMINAL AORTOGRAM;  Surgeon: Conrad Buckley, MD;  Location: [place] [facility];  Service: Cardiovascular;  Laterality: N/A;  . ABDOMINAL AORTOGRAM W/LOWER EXTREMITY N/A 02/23/2017   Procedure: Abdominal Aortogram w/Lower Extremity;  Surgeon: [name], MD;  Location: [place] [facility];  Service: Cardiovascular;  Laterality: N/A;  Rt. leg  . AMPUTATION Right 02/28/2017   Procedure: RIGHT BELOW KNEE AMPUTATION;  Surgeon: [name], MD;  Location: [place];  Service: Vascular;  Laterality: Right;  . AV FISTULA PLACEMENT     Hx: of  . AV FISTULA PLACEMENT Right 05/04/2013   Procedure: ARTERIOVENOUS (AV) FISTULA CREATION- RIGHT ARM;  Surgeon: Conrad [place], MD;  Location: [place];  Service: Vascular;  Laterality: Right;  Ultrasound guided  . AV FISTULA PLACEMENT Right 07/28/2016   Procedure: BRACHIOCEPHALIC ARTERIOVENOUS (AV) FISTULA CREATION;  Surgeon: [name], MD;  Location: [place];  Service: Vascular;  Laterality: Right;  . BACK SURGERY    . CATARACT EXTRACTION W/PHACO Left 07/16/2014   Procedure: CATARACT EXTRACTION PHACO AND INTRAOCULAR LENS PLACEMENT (IOC);  Surgeon: [name], MD;  Location: [facility];  Service: Ophthalmology;  Laterality: Left;  CDE:8.86  . CATARACT EXTRACTION W/PHACO Right 07/30/2014   Procedure: CATARACT EXTRACTION PHACO AND INTRAOCULAR LENS PLACEMENT (IOC);  Surgeon: [name], MD;  Location: [facility];  Service: Ophthalmology;  Laterality: Right;  CDE 8.99  . COLONOSCOPY     Hx: of  . EYE SURGERY    .  FISTULA SUPERFICIALIZATION Right 07/28/2016   Procedure: FISTULA SUPERFICIALIZATION;  Surgeon: [name], MD;  Location: [place];  Service: Vascular;  Laterality: Right;  . FISTULA SUPERFICIALIZATION Right 10/13/2016   Procedure: BRACHIOCEPHALIC ARTERIOVENOUS FISTULA SUPERFICIALIZATION;  Surgeon: [name], MD;  Location: [place];  Service: Vascular;  Laterality: Right;  . FISTULOGRAM N/A 05/04/2013   Procedure:  CENTRAL VENOGRAM;  Surgeon: Conrad [place], MD;  Location: [place];  Service: Vascular;  Laterality: N/A;  . INSERTION OF DIALYSIS CATHETER Right 05/04/2013   Procedure: INSERTION OF DIALYSIS CATHETER;  Surgeon: Conrad [place], MD;  Location: [place];  Service: Vascular;  Laterality: Right;  Ultrasound guided  . INSERTION OF DIALYSIS CATHETER N/A 07/28/2016   Procedure: INSERTION OF DIALYSIS CATHETER;  Surgeon: [name], MD;  Location: [place];  Service: Vascular;  Laterality: N/A;  . IR FLUORO GUIDE CV LINE RIGHT  04/13/2017  . IR THROMBECTOMY AV FISTULA W/THROMBOLYSIS/PTA/STENT INC/SHUNT/IMG RT Right 09/07/2017  . IR US GUIDE VASC ACCESS RIGHT  04/13/2017  . IR US GUIDE VASC ACCESS RIGHT  09/07/2017  . LIGATION OF ARTERIOVENOUS  FISTULA Left 05/04/2013   Procedure: LIGATION OF LEFT RADIAL CEPHALIC ARTERIOVENOUS  FISTULA;  Surgeon: Conrad [place], MD;  Location: [place];  Service: Vascular;  Laterality: Left;  Ultrasound guided  . LIGATION OF ARTERIOVENOUS  FISTULA Right 07/28/2016   Procedure: LIGATION OF RADIOCEPHALIC ARTERIOVENOUS  FISTULA;  Surgeon: [name], MD;  Location: [place];  Service: Vascular;  Laterality: Right;  . LOWER EXTREMITY ANGIOGRAPHY N/A 12/30/2016   Procedure: Lower Extremity Angiography;  Surgeon: Waynetta Sandy, MD;  Location: [place] [facility];  Service: Cardiovascular;  Laterality: N/A;  . LUMBAR SPINE SURGERY    . PERIPHERAL VASCULAR ATHERECTOMY Right 12/30/2016   Procedure: Peripheral Vascular Atherectomy;  Surgeon: Waynetta Sandy, MD;  Location: [place] [facility];  Service: Cardiovascular;  Laterality: Right;  AT and PT  . PERIPHERAL VASCULAR BALLOON ANGIOPLASTY Right 12/30/2016   Procedure: Peripheral Vascular Balloon Angioplasty;  Surgeon: Waynetta Sandy, MD;  Location: [place] [facility];  Service: Cardiovascular;  Laterality: Right;  PTA of PT and AT  . PERIPHERAL VASCULAR BALLOON ANGIOPLASTY  02/23/2017   Procedure: Peripheral  Vascular Balloon Angioplasty;  Surgeon: [name], MD;  Location: [place] [facility];  Service: Cardiovascular;;  RT. Anterior Tib.  [name] PERIPHERAL VASCULAR BALLOON ANGIOPLASTY  04/08/2017   Procedure: PERIPHERAL VASCULAR BALLOON ANGIOPLASTY;  Surgeon: Conrad Lind, MD;  Location: [place] [facility];  Service: Cardiovascular;;  . PERIPHERAL VASCULAR CATHETERIZATION N/A 01/24/2015   Procedure: Fistulagram;  Surgeon: Conrad [place], MD;  Location: [place] [facility];  Service: Cardiovascular;  Laterality: N/A;  . PERIPHERAL VASCULAR CATHETERIZATION Right 06/04/2016   Procedure: A/V Shuntogram/Fistulagram;  Surgeon: Conrad [place], MD;  Location: [place] [facility];  Service: Cardiovascular;  Laterality: Right;  . REVISON OF ARTERIOVENOUS FISTULA Right 01/02/2014   Procedure: REVISON OF ARTERIOVENOUS FISTULA ANASTOMOSIS;  Surgeon: Conrad [place], MD;  Location: [place];  Service: Vascular;  Laterality: Right;  . REVISON OF ARTERIOVENOUS FISTULA Right 01/02/2016   Procedure: REVISION OF RADIOCEPHALIC ARTERIOVENOUS FISTULA  with BOVINE PATCH ANGIOPLASTY RIGHT RADIAL ARTERY;  Surgeon: Mal [name], MD;  Location: [place];  Service: Vascular;  Laterality: Right;  . SHUNTOGRAM N/A 11/07/2013   Procedure: Fistulogram;  Surgeon: [name], MD;  Location: [facility];  Service: Cardiovascular;  Laterality: N/A;  . THROMBECTOMY W/ EMBOLECTOMY Right  07/18/2017   Procedure: THROMBECTOMY ARTERIOVENOUS HERO GRAFT ARM;  Surgeon: [name], MD;  Location: [place];  Service: Vascular;  Laterality: Right;  . TRANSMETATARSAL AMPUTATION Right 01/01/2017   Procedure: TRANSMETATARSAL AMPUTATION;  Surgeon: [name], MD;  Location: [place];  Service: Vascular;  Laterality: Right;  [name] VASCULAR ACCESS DEVICE INSERTION Right 05/13/2017   Procedure: INSERTION OF HERO VASCULAR ACCESS DEVICE RIGHT UPPER ARM;  Surgeon: Waynetta Sandy, MD;  Location: [place];  Service: Vascular;  Laterality: Right;  . VENOGRAM N/A  05/13/2017   Procedure: CENTRAL VENOGRAM;  Surgeon: Waynetta Sandy, MD;  Location: [facility] OR;  Service: Vascular;  Laterality: N/A;    Current Outpatient Medications  Medication Sig Dispense Refill  . acetaminophen (TYLENOL) 500 MG tablet Take 500 mg by mouth every 6 (six) hours as needed for mild pain. Do not exceed 4 gms of tylenol in 24 hours    . aspirin EC 81 MG tablet Take 81 mg by mouth daily.    [name] atorvastatin (LIPITOR) 40 MG tablet Take 1 tablet (40 mg total) by mouth daily at 6 PM. 30 tablet 0  . B Complex-C-Folic Acid (DIALYVITE PO) Take 1 tablet by mouth every Monday, Wednesday, and Friday.    . bisacodyl (DULCOLAX) 5 MG EC tablet Take 2 tablets (10 mg total) by mouth daily as needed for moderate constipation. 30 tablet 0  . carvedilol (COREG) 12.5 MG tablet Take 1 tablet (12.5 mg total) by mouth See admin instructions. Take 1 tablet (12.5 mg) by mouth twice daily on Sunday, Tuesday, Thursday, Saturday (hold on dialysis days)    . cinacalcet (SENSIPAR) 30 MG tablet Take 30 mg by mouth daily with breakfast.     . EPINEPHrine (EPIPEN 2-PAK) 0.3 mg/0.3 mL IJ SOAJ injection Inject 0.3 mg into the muscle once as needed (anaphylaxis).    . finasteride (PROSCAR) 5 MG tablet Take 1 tablet (5 mg total) by mouth daily. 30 tablet 0  . Hypromellose (ARTIFICIAL TEARS) 0.4 % SOLN Place 1 drop into both eyes every 6 (six) hours as needed (dry eyes).    [name] LANTUS SOLOSTAR 100 UNIT/ML Solostar Pen Inject 15 Units into the skin at bedtime. (Patient taking differently: Inject 16 Units into the skin at bedtime. ) 15 mL 0  . omeprazole (PRILOSEC) 40 MG capsule Take 40 mg by mouth daily.    [name] oxyCODONE-acetaminophen (ROXICET) 5-325 MG tablet Take 1-2 tablets by mouth every 6 (six) hours as needed. 10 tablet 0  . polyethylene glycol powder (GLYCOLAX/MIRALAX) powder Take 17 g by mouth daily. (Patient taking differently: Take 17 g by mouth daily. Mix in 8 oz liquid and drink) 255 g 0  . senna (SENOKOT)  8.6 MG TABS tablet Take 2 tablets (17.2 mg total) by mouth at bedtime. 120 each 0  . sevelamer carbonate (RENVELA) 800 MG tablet Take 2,400 mg by mouth 3 (three) times daily with meals.      No current facility-administered medications for this visit.    Allergies:  Bee venom; Iodine; Penicillins; Sulfadiazine; and Plavix [clopidogrel]   Social History: The patient  reports that  has never smoked. he has never used smokeless tobacco. He reports that he drinks alcohol. He reports that he does not use drugs.   ROS:  Please see the history of present illness. Otherwise, complete review of systems is positive for hearing loss.  All other systems are reviewed and negative.   Physical Exam: VS:  BP 135/86 (BP Location: Right  Arm)   Pulse 67   Ht 6\' 2"  (1.88 m)   Wt 271 lb (122.9 kg)   SpO2 96%   BMI 34.79 kg/m , BMI Body mass index is 34.79 kg/m.  Wt Readings from Last 3 Encounters:  09/16/17 271 lb (122.9 kg)  09/07/17 265 lb (120.2 kg)  08/10/17 274 lb (124.3 kg)    General: Obese male, appears comfortable at rest. HEENT: Conjunctiva and lids normal, oropharynx clear with poor dentition. Neck: Supple, no elevated JVP left carotid bruit, no thyromegaly. Lungs: Decreased breath sounds without wheezing, nonlabored breathing at rest. Cardiac: Regular rate and rhythm, no S3, soft systolic murmur. Abdomen: Obese, nontender, bowel sounds present. Extremities: Right BKA with prosthesis. Skin: Warm and dry. Musculoskeletal: No kyphosis. Neuropsychiatric: Alert and oriented x3, affect grossly appropriate.  ECG: I personally reviewed the tracing from 07/16/2017 which showed sinus rhythm with prolonged PR interval and IVCD.  Recent Labwork: 12/29/2016: Magnesium 1.9 01/04/2017: TSH 6.956 07/18/2017: ALT 16; AST 25 09/07/2017: BUN 73; Creatinine, Ser 10.85; Hemoglobin 11.2; Platelets 214; Potassium 4.9; Sodium 138   Other Studies Reviewed Today:  Echocardiogram 02/20/2017: Study  Conclusions  - Left ventricle: The cavity size was normal. There was moderate   concentric hypertrophy. Systolic function was normal. The   estimated ejection fraction was in the range of 60% to 65%.   Images were inadequate for LV wall motion assessment. Left   ventricular diastolic function parameters were normal. Doppler   parameters are consistent with high ventricular filling pressure. - Aortic valve: Valve area (Vmax): 3.87 cm^2. - Mitral valve: Calcified annulus. Mildly thickened leaflets .   There was trivial regurgitation. - Right ventricle: Poorly visualized. The cavity size was   moderately dilated. Wall thickness was normal. - Pulmonic valve: There was trivial regurgitation.  Assessment and Plan:  1.  History of nonischemic cardiomyopathy with normalization of LVEF, recently evaluated in July 2018 in the 60-65% range.  Continue medical therapy including Coreg and Altace.  2.  ESRD on hemodialysis.  He continues to follow with Dr. Lowanda Foster.  3.  Essential hypertension, keep follow-up with Dr. Hilma Favors.  No changes made in current regimen.  4.  History of stroke, no new residua.  He is on aspirin and statin therapy.  Current medicines were reviewed with the patient today.  Disposition: Follow-up in 1 year, sooner if needed.  Signed, Satira Sark, MD, [facility] 09/16/2017 9:58 AM    [facility] [place], [place], [place] [address] Phone: [phone]; Fax: [phone]

## 2017-09-16 ENCOUNTER — Encounter: Payer: Self-pay | Admitting: Cardiology

## 2017-09-16 ENCOUNTER — Ambulatory Visit (INDEPENDENT_AMBULATORY_CARE_PROVIDER_SITE_OTHER): Payer: Medicare Other | Admitting: Cardiology

## 2017-09-16 VITALS — BP 135/86 | HR 67 | Ht 74.0 in | Wt 271.0 lb

## 2017-09-16 DIAGNOSIS — Z992 Dependence on renal dialysis: Secondary | ICD-10-CM

## 2017-09-16 DIAGNOSIS — Z8673 Personal history of transient ischemic attack (TIA), and cerebral infarction without residual deficits: Secondary | ICD-10-CM | POA: Diagnosis not present

## 2017-09-16 DIAGNOSIS — I428 Other cardiomyopathies: Secondary | ICD-10-CM

## 2017-09-16 DIAGNOSIS — N186 End stage renal disease: Secondary | ICD-10-CM

## 2017-09-16 DIAGNOSIS — I10 Essential (primary) hypertension: Secondary | ICD-10-CM | POA: Diagnosis not present

## 2017-09-16 NOTE — Patient Instructions (Signed)

## 2017-09-17 DIAGNOSIS — N2581 Secondary hyperparathyroidism of renal origin: Secondary | ICD-10-CM | POA: Diagnosis not present

## 2017-09-17 DIAGNOSIS — N186 End stage renal disease: Secondary | ICD-10-CM | POA: Diagnosis not present

## 2017-09-17 DIAGNOSIS — D509 Iron deficiency anemia, unspecified: Secondary | ICD-10-CM | POA: Diagnosis not present

## 2017-09-17 DIAGNOSIS — Z992 Dependence on renal dialysis: Secondary | ICD-10-CM | POA: Diagnosis not present

## 2017-09-17 DIAGNOSIS — D631 Anemia in chronic kidney disease: Secondary | ICD-10-CM | POA: Diagnosis not present

## 2017-09-20 DIAGNOSIS — D509 Iron deficiency anemia, unspecified: Secondary | ICD-10-CM | POA: Diagnosis not present

## 2017-09-20 DIAGNOSIS — N2581 Secondary hyperparathyroidism of renal origin: Secondary | ICD-10-CM | POA: Diagnosis not present

## 2017-09-20 DIAGNOSIS — Z992 Dependence on renal dialysis: Secondary | ICD-10-CM | POA: Diagnosis not present

## 2017-09-20 DIAGNOSIS — D631 Anemia in chronic kidney disease: Secondary | ICD-10-CM | POA: Diagnosis not present

## 2017-09-20 DIAGNOSIS — N186 End stage renal disease: Secondary | ICD-10-CM | POA: Diagnosis not present

## 2017-09-22 DIAGNOSIS — Z992 Dependence on renal dialysis: Secondary | ICD-10-CM | POA: Diagnosis not present

## 2017-09-22 DIAGNOSIS — D509 Iron deficiency anemia, unspecified: Secondary | ICD-10-CM | POA: Diagnosis not present

## 2017-09-22 DIAGNOSIS — D631 Anemia in chronic kidney disease: Secondary | ICD-10-CM | POA: Diagnosis not present

## 2017-09-22 DIAGNOSIS — N2581 Secondary hyperparathyroidism of renal origin: Secondary | ICD-10-CM | POA: Diagnosis not present

## 2017-09-22 DIAGNOSIS — N186 End stage renal disease: Secondary | ICD-10-CM | POA: Diagnosis not present

## 2017-09-24 DIAGNOSIS — D509 Iron deficiency anemia, unspecified: Secondary | ICD-10-CM | POA: Diagnosis not present

## 2017-09-24 DIAGNOSIS — N186 End stage renal disease: Secondary | ICD-10-CM | POA: Diagnosis not present

## 2017-09-24 DIAGNOSIS — Z992 Dependence on renal dialysis: Secondary | ICD-10-CM | POA: Diagnosis not present

## 2017-09-24 DIAGNOSIS — N2581 Secondary hyperparathyroidism of renal origin: Secondary | ICD-10-CM | POA: Diagnosis not present

## 2017-09-24 DIAGNOSIS — D631 Anemia in chronic kidney disease: Secondary | ICD-10-CM | POA: Diagnosis not present

## 2017-09-27 DIAGNOSIS — N2581 Secondary hyperparathyroidism of renal origin: Secondary | ICD-10-CM | POA: Diagnosis not present

## 2017-09-27 DIAGNOSIS — D631 Anemia in chronic kidney disease: Secondary | ICD-10-CM | POA: Diagnosis not present

## 2017-09-27 DIAGNOSIS — Z992 Dependence on renal dialysis: Secondary | ICD-10-CM | POA: Diagnosis not present

## 2017-09-27 DIAGNOSIS — N186 End stage renal disease: Secondary | ICD-10-CM | POA: Diagnosis not present

## 2017-09-27 DIAGNOSIS — D509 Iron deficiency anemia, unspecified: Secondary | ICD-10-CM | POA: Diagnosis not present

## 2017-09-29 DIAGNOSIS — D631 Anemia in chronic kidney disease: Secondary | ICD-10-CM | POA: Diagnosis not present

## 2017-09-29 DIAGNOSIS — N2581 Secondary hyperparathyroidism of renal origin: Secondary | ICD-10-CM | POA: Diagnosis not present

## 2017-09-29 DIAGNOSIS — N186 End stage renal disease: Secondary | ICD-10-CM | POA: Diagnosis not present

## 2017-09-29 DIAGNOSIS — Z992 Dependence on renal dialysis: Secondary | ICD-10-CM | POA: Diagnosis not present

## 2017-09-29 DIAGNOSIS — D509 Iron deficiency anemia, unspecified: Secondary | ICD-10-CM | POA: Diagnosis not present

## 2017-09-30 DIAGNOSIS — Z992 Dependence on renal dialysis: Secondary | ICD-10-CM | POA: Diagnosis not present

## 2017-09-30 DIAGNOSIS — N186 End stage renal disease: Secondary | ICD-10-CM | POA: Diagnosis not present

## 2017-10-01 DIAGNOSIS — Z992 Dependence on renal dialysis: Secondary | ICD-10-CM | POA: Diagnosis not present

## 2017-10-01 DIAGNOSIS — N2581 Secondary hyperparathyroidism of renal origin: Secondary | ICD-10-CM | POA: Diagnosis not present

## 2017-10-01 DIAGNOSIS — N186 End stage renal disease: Secondary | ICD-10-CM | POA: Diagnosis not present

## 2017-10-01 DIAGNOSIS — D509 Iron deficiency anemia, unspecified: Secondary | ICD-10-CM | POA: Diagnosis not present

## 2017-10-01 DIAGNOSIS — D631 Anemia in chronic kidney disease: Secondary | ICD-10-CM | POA: Diagnosis not present

## 2017-10-04 DIAGNOSIS — N186 End stage renal disease: Secondary | ICD-10-CM | POA: Diagnosis not present

## 2017-10-04 DIAGNOSIS — D631 Anemia in chronic kidney disease: Secondary | ICD-10-CM | POA: Diagnosis not present

## 2017-10-04 DIAGNOSIS — N2581 Secondary hyperparathyroidism of renal origin: Secondary | ICD-10-CM | POA: Diagnosis not present

## 2017-10-04 DIAGNOSIS — Z992 Dependence on renal dialysis: Secondary | ICD-10-CM | POA: Diagnosis not present

## 2017-10-04 DIAGNOSIS — D509 Iron deficiency anemia, unspecified: Secondary | ICD-10-CM | POA: Diagnosis not present

## 2017-10-06 DIAGNOSIS — D631 Anemia in chronic kidney disease: Secondary | ICD-10-CM | POA: Diagnosis not present

## 2017-10-06 DIAGNOSIS — N186 End stage renal disease: Secondary | ICD-10-CM | POA: Diagnosis not present

## 2017-10-06 DIAGNOSIS — N2581 Secondary hyperparathyroidism of renal origin: Secondary | ICD-10-CM | POA: Diagnosis not present

## 2017-10-06 DIAGNOSIS — Z992 Dependence on renal dialysis: Secondary | ICD-10-CM | POA: Diagnosis not present

## 2017-10-06 DIAGNOSIS — D509 Iron deficiency anemia, unspecified: Secondary | ICD-10-CM | POA: Diagnosis not present

## 2017-10-08 DIAGNOSIS — D631 Anemia in chronic kidney disease: Secondary | ICD-10-CM | POA: Diagnosis not present

## 2017-10-08 DIAGNOSIS — Z992 Dependence on renal dialysis: Secondary | ICD-10-CM | POA: Diagnosis not present

## 2017-10-08 DIAGNOSIS — N2581 Secondary hyperparathyroidism of renal origin: Secondary | ICD-10-CM | POA: Diagnosis not present

## 2017-10-08 DIAGNOSIS — N186 End stage renal disease: Secondary | ICD-10-CM | POA: Diagnosis not present

## 2017-10-08 DIAGNOSIS — D509 Iron deficiency anemia, unspecified: Secondary | ICD-10-CM | POA: Diagnosis not present

## 2017-10-11 DIAGNOSIS — Z992 Dependence on renal dialysis: Secondary | ICD-10-CM | POA: Diagnosis not present

## 2017-10-11 DIAGNOSIS — D631 Anemia in chronic kidney disease: Secondary | ICD-10-CM | POA: Diagnosis not present

## 2017-10-11 DIAGNOSIS — D509 Iron deficiency anemia, unspecified: Secondary | ICD-10-CM | POA: Diagnosis not present

## 2017-10-11 DIAGNOSIS — N186 End stage renal disease: Secondary | ICD-10-CM | POA: Diagnosis not present

## 2017-10-11 DIAGNOSIS — N2581 Secondary hyperparathyroidism of renal origin: Secondary | ICD-10-CM | POA: Diagnosis not present

## 2017-10-13 DIAGNOSIS — N2581 Secondary hyperparathyroidism of renal origin: Secondary | ICD-10-CM | POA: Diagnosis not present

## 2017-10-13 DIAGNOSIS — D631 Anemia in chronic kidney disease: Secondary | ICD-10-CM | POA: Diagnosis not present

## 2017-10-13 DIAGNOSIS — D509 Iron deficiency anemia, unspecified: Secondary | ICD-10-CM | POA: Diagnosis not present

## 2017-10-13 DIAGNOSIS — Z992 Dependence on renal dialysis: Secondary | ICD-10-CM | POA: Diagnosis not present

## 2017-10-13 DIAGNOSIS — N186 End stage renal disease: Secondary | ICD-10-CM | POA: Diagnosis not present

## 2017-10-15 DIAGNOSIS — Z992 Dependence on renal dialysis: Secondary | ICD-10-CM | POA: Diagnosis not present

## 2017-10-15 DIAGNOSIS — N186 End stage renal disease: Secondary | ICD-10-CM | POA: Diagnosis not present

## 2017-10-15 DIAGNOSIS — D509 Iron deficiency anemia, unspecified: Secondary | ICD-10-CM | POA: Diagnosis not present

## 2017-10-15 DIAGNOSIS — D631 Anemia in chronic kidney disease: Secondary | ICD-10-CM | POA: Diagnosis not present

## 2017-10-15 DIAGNOSIS — N2581 Secondary hyperparathyroidism of renal origin: Secondary | ICD-10-CM | POA: Diagnosis not present

## 2017-10-18 DIAGNOSIS — N186 End stage renal disease: Secondary | ICD-10-CM | POA: Diagnosis not present

## 2017-10-18 DIAGNOSIS — D509 Iron deficiency anemia, unspecified: Secondary | ICD-10-CM | POA: Diagnosis not present

## 2017-10-18 DIAGNOSIS — N2581 Secondary hyperparathyroidism of renal origin: Secondary | ICD-10-CM | POA: Diagnosis not present

## 2017-10-18 DIAGNOSIS — D631 Anemia in chronic kidney disease: Secondary | ICD-10-CM | POA: Diagnosis not present

## 2017-10-18 DIAGNOSIS — Z992 Dependence on renal dialysis: Secondary | ICD-10-CM | POA: Diagnosis not present

## 2017-10-20 DIAGNOSIS — D631 Anemia in chronic kidney disease: Secondary | ICD-10-CM | POA: Diagnosis not present

## 2017-10-20 DIAGNOSIS — N2581 Secondary hyperparathyroidism of renal origin: Secondary | ICD-10-CM | POA: Diagnosis not present

## 2017-10-20 DIAGNOSIS — N186 End stage renal disease: Secondary | ICD-10-CM | POA: Diagnosis not present

## 2017-10-20 DIAGNOSIS — Z992 Dependence on renal dialysis: Secondary | ICD-10-CM | POA: Diagnosis not present

## 2017-10-20 DIAGNOSIS — D509 Iron deficiency anemia, unspecified: Secondary | ICD-10-CM | POA: Diagnosis not present

## 2017-10-22 DIAGNOSIS — N2581 Secondary hyperparathyroidism of renal origin: Secondary | ICD-10-CM | POA: Diagnosis not present

## 2017-10-22 DIAGNOSIS — N186 End stage renal disease: Secondary | ICD-10-CM | POA: Diagnosis not present

## 2017-10-22 DIAGNOSIS — D631 Anemia in chronic kidney disease: Secondary | ICD-10-CM | POA: Diagnosis not present

## 2017-10-22 DIAGNOSIS — D509 Iron deficiency anemia, unspecified: Secondary | ICD-10-CM | POA: Diagnosis not present

## 2017-10-22 DIAGNOSIS — Z992 Dependence on renal dialysis: Secondary | ICD-10-CM | POA: Diagnosis not present

## 2017-10-25 DIAGNOSIS — N2581 Secondary hyperparathyroidism of renal origin: Secondary | ICD-10-CM | POA: Diagnosis not present

## 2017-10-25 DIAGNOSIS — N186 End stage renal disease: Secondary | ICD-10-CM | POA: Diagnosis not present

## 2017-10-25 DIAGNOSIS — D631 Anemia in chronic kidney disease: Secondary | ICD-10-CM | POA: Diagnosis not present

## 2017-10-25 DIAGNOSIS — Z794 Long term (current) use of insulin: Secondary | ICD-10-CM | POA: Diagnosis not present

## 2017-10-25 DIAGNOSIS — D509 Iron deficiency anemia, unspecified: Secondary | ICD-10-CM | POA: Diagnosis not present

## 2017-10-25 DIAGNOSIS — Z992 Dependence on renal dialysis: Secondary | ICD-10-CM | POA: Diagnosis not present

## 2017-10-25 DIAGNOSIS — E119 Type 2 diabetes mellitus without complications: Secondary | ICD-10-CM | POA: Diagnosis not present

## 2017-10-27 DIAGNOSIS — D509 Iron deficiency anemia, unspecified: Secondary | ICD-10-CM | POA: Diagnosis not present

## 2017-10-27 DIAGNOSIS — N186 End stage renal disease: Secondary | ICD-10-CM | POA: Diagnosis not present

## 2017-10-27 DIAGNOSIS — D631 Anemia in chronic kidney disease: Secondary | ICD-10-CM | POA: Diagnosis not present

## 2017-10-27 DIAGNOSIS — Z992 Dependence on renal dialysis: Secondary | ICD-10-CM | POA: Diagnosis not present

## 2017-10-27 DIAGNOSIS — N2581 Secondary hyperparathyroidism of renal origin: Secondary | ICD-10-CM | POA: Diagnosis not present

## 2017-10-29 DIAGNOSIS — N186 End stage renal disease: Secondary | ICD-10-CM | POA: Diagnosis not present

## 2017-10-29 DIAGNOSIS — N2581 Secondary hyperparathyroidism of renal origin: Secondary | ICD-10-CM | POA: Diagnosis not present

## 2017-10-29 DIAGNOSIS — D631 Anemia in chronic kidney disease: Secondary | ICD-10-CM | POA: Diagnosis not present

## 2017-10-29 DIAGNOSIS — Z992 Dependence on renal dialysis: Secondary | ICD-10-CM | POA: Diagnosis not present

## 2017-10-29 DIAGNOSIS — D509 Iron deficiency anemia, unspecified: Secondary | ICD-10-CM | POA: Diagnosis not present

## 2017-10-31 DIAGNOSIS — Z992 Dependence on renal dialysis: Secondary | ICD-10-CM | POA: Diagnosis not present

## 2017-10-31 DIAGNOSIS — N186 End stage renal disease: Secondary | ICD-10-CM | POA: Diagnosis not present

## 2017-11-01 DIAGNOSIS — N2581 Secondary hyperparathyroidism of renal origin: Secondary | ICD-10-CM | POA: Diagnosis not present

## 2017-11-01 DIAGNOSIS — N186 End stage renal disease: Secondary | ICD-10-CM | POA: Diagnosis not present

## 2017-11-01 DIAGNOSIS — D631 Anemia in chronic kidney disease: Secondary | ICD-10-CM | POA: Diagnosis not present

## 2017-11-01 DIAGNOSIS — Z992 Dependence on renal dialysis: Secondary | ICD-10-CM | POA: Diagnosis not present

## 2017-11-01 DIAGNOSIS — D509 Iron deficiency anemia, unspecified: Secondary | ICD-10-CM | POA: Diagnosis not present

## 2017-11-03 DIAGNOSIS — N2581 Secondary hyperparathyroidism of renal origin: Secondary | ICD-10-CM | POA: Diagnosis not present

## 2017-11-03 DIAGNOSIS — Z794 Long term (current) use of insulin: Secondary | ICD-10-CM | POA: Diagnosis not present

## 2017-11-03 DIAGNOSIS — Z833 Family history of diabetes mellitus: Secondary | ICD-10-CM | POA: Diagnosis not present

## 2017-11-03 DIAGNOSIS — I132 Hypertensive heart and chronic kidney disease with heart failure and with stage 5 chronic kidney disease, or end stage renal disease: Secondary | ICD-10-CM | POA: Diagnosis not present

## 2017-11-03 DIAGNOSIS — Z8249 Family history of ischemic heart disease and other diseases of the circulatory system: Secondary | ICD-10-CM | POA: Diagnosis not present

## 2017-11-03 DIAGNOSIS — D631 Anemia in chronic kidney disease: Secondary | ICD-10-CM | POA: Diagnosis not present

## 2017-11-03 DIAGNOSIS — Z7982 Long term (current) use of aspirin: Secondary | ICD-10-CM | POA: Diagnosis not present

## 2017-11-03 DIAGNOSIS — N186 End stage renal disease: Secondary | ICD-10-CM | POA: Diagnosis not present

## 2017-11-03 DIAGNOSIS — D509 Iron deficiency anemia, unspecified: Secondary | ICD-10-CM | POA: Diagnosis not present

## 2017-11-03 DIAGNOSIS — T8131XA Disruption of external operation (surgical) wound, not elsewhere classified, initial encounter: Secondary | ICD-10-CM | POA: Diagnosis not present

## 2017-11-03 DIAGNOSIS — T82838A Hemorrhage of vascular prosthetic devices, implants and grafts, initial encounter: Secondary | ICD-10-CM | POA: Diagnosis not present

## 2017-11-03 DIAGNOSIS — E1122 Type 2 diabetes mellitus with diabetic chronic kidney disease: Secondary | ICD-10-CM | POA: Diagnosis not present

## 2017-11-03 DIAGNOSIS — I509 Heart failure, unspecified: Secondary | ICD-10-CM | POA: Diagnosis not present

## 2017-11-03 DIAGNOSIS — Z992 Dependence on renal dialysis: Secondary | ICD-10-CM | POA: Diagnosis not present

## 2017-11-03 DIAGNOSIS — Z79899 Other long term (current) drug therapy: Secondary | ICD-10-CM | POA: Diagnosis not present

## 2017-11-03 DIAGNOSIS — T8189XA Other complications of procedures, not elsewhere classified, initial encounter: Secondary | ICD-10-CM | POA: Diagnosis not present

## 2017-11-04 ENCOUNTER — Telehealth: Payer: Self-pay | Admitting: Surgery

## 2017-11-04 NOTE — Telephone Encounter (Signed)
Pt on for 4/4 with BCC per [facility]

## 2017-11-04 NOTE — Telephone Encounter (Signed)
-----   Message from Mena Goes, RN sent at 11/04/2017 10:23 AM EDT ----- Regarding: 1 week for suture removal with NP   ----- Message ----- From: [name], MD Sent: 11/03/2017   5:20 PM To: [facility] Charge Pool  Eeen Er called to state that patient came into ED with bleeding from HeRO graft.  There was no ulcer, just bleeding from the needle stick.  A stitch was placed for hemostasis.  Can we get him in next week to see NP or MD for suture removal and inspection of the graft.

## 2017-11-05 DIAGNOSIS — Z992 Dependence on renal dialysis: Secondary | ICD-10-CM | POA: Diagnosis not present

## 2017-11-05 DIAGNOSIS — D631 Anemia in chronic kidney disease: Secondary | ICD-10-CM | POA: Diagnosis not present

## 2017-11-05 DIAGNOSIS — D509 Iron deficiency anemia, unspecified: Secondary | ICD-10-CM | POA: Diagnosis not present

## 2017-11-05 DIAGNOSIS — N2581 Secondary hyperparathyroidism of renal origin: Secondary | ICD-10-CM | POA: Diagnosis not present

## 2017-11-05 DIAGNOSIS — N186 End stage renal disease: Secondary | ICD-10-CM | POA: Diagnosis not present

## 2017-11-08 DIAGNOSIS — D631 Anemia in chronic kidney disease: Secondary | ICD-10-CM | POA: Diagnosis not present

## 2017-11-08 DIAGNOSIS — N186 End stage renal disease: Secondary | ICD-10-CM | POA: Diagnosis not present

## 2017-11-08 DIAGNOSIS — N2581 Secondary hyperparathyroidism of renal origin: Secondary | ICD-10-CM | POA: Diagnosis not present

## 2017-11-08 DIAGNOSIS — Z992 Dependence on renal dialysis: Secondary | ICD-10-CM | POA: Diagnosis not present

## 2017-11-08 DIAGNOSIS — D509 Iron deficiency anemia, unspecified: Secondary | ICD-10-CM | POA: Diagnosis not present

## 2017-11-10 DIAGNOSIS — D509 Iron deficiency anemia, unspecified: Secondary | ICD-10-CM | POA: Diagnosis not present

## 2017-11-10 DIAGNOSIS — D631 Anemia in chronic kidney disease: Secondary | ICD-10-CM | POA: Diagnosis not present

## 2017-11-10 DIAGNOSIS — N186 End stage renal disease: Secondary | ICD-10-CM | POA: Diagnosis not present

## 2017-11-10 DIAGNOSIS — Z992 Dependence on renal dialysis: Secondary | ICD-10-CM | POA: Diagnosis not present

## 2017-11-10 DIAGNOSIS — N2581 Secondary hyperparathyroidism of renal origin: Secondary | ICD-10-CM | POA: Diagnosis not present

## 2017-11-11 ENCOUNTER — Encounter: Payer: Self-pay | Admitting: Vascular Surgery

## 2017-11-11 ENCOUNTER — Other Ambulatory Visit: Payer: Self-pay | Admitting: *Deleted

## 2017-11-11 ENCOUNTER — Other Ambulatory Visit: Payer: Self-pay

## 2017-11-11 ENCOUNTER — Ambulatory Visit (INDEPENDENT_AMBULATORY_CARE_PROVIDER_SITE_OTHER): Payer: Medicare Other | Admitting: Vascular Surgery

## 2017-11-11 ENCOUNTER — Encounter: Payer: Self-pay | Admitting: *Deleted

## 2017-11-11 VITALS — BP 160/70 | HR 74 | Temp 97.6°F | Resp 20 | Ht 74.0 in | Wt 263.6 lb

## 2017-11-11 DIAGNOSIS — Z992 Dependence on renal dialysis: Secondary | ICD-10-CM | POA: Diagnosis not present

## 2017-11-11 DIAGNOSIS — N186 End stage renal disease: Secondary | ICD-10-CM

## 2017-11-11 NOTE — Progress Notes (Signed)
Patient ID: Jacob [name], male   DOB: Mar 02, 1951, 67 y.o.   MRN: [id number]  Reason for Consult: Follow-up (1 wk f/u )   Referred by Sharilyn Sites, MD  Subjective:     HPI:  Jacob [name] is a 67 y.o. male with history of end-stage renal disease who has a hero graft placed and had subsequent thrombectomy.  He is actually doing quite well except for last week he went to the ED for persistent bleeding for 3 hours after dialysis.  At that time he had a stitch placed in his graft.  He is subsequently been on antibiotics and is now dialyzing without issue.  Past Medical History:  Diagnosis Date  . Anemia in chronic kidney disease(285.21)   . Anxiety   . Arthritis   . Depression   . ESRD (end stage renal disease) on dialysis [facility])    "MWF; [facility] [place]" (02/18/2017)  . Essential hypertension   . GERD (gastroesophageal reflux disease)   . Glaucoma   . History of blood transfusion   . History of stroke 2016  . Insomnia   . Nonischemic cardiomyopathy ([place])   . Type 2 diabetes mellitus (HCC)    Family History  Problem Relation Age of Onset  . Heart attack Brother        33  . Heart disease Brother        before age 19  . Hypertension Brother   . Heart attack Brother        72  . Heart attack Brother        59  . Diabetes Mother   . Hypertension Mother   . Heart disease Mother   . Heart disease Father   . Hypertension Father   . Other Father        amputation  . Hypertension Sister   . Heart disease Sister   . Vision loss Maternal Uncle    Past Surgical History:  Procedure Laterality Date  . A/V FISTULAGRAM Right 10/06/2016   Procedure: A/V Fistulagram;  Surgeon: [name], MD;  Location: [place] [facility];  Service: Cardiovascular;  Laterality: Right;  . A/V FISTULAGRAM Right 04/27/2017   Procedure: A/V Fistulagram;  Surgeon: [name], MD;  Location: [place] [facility];  Service: Cardiovascular;  Laterality: Right;  . ABDOMINAL AORTOGRAM N/A 12/30/2016   Procedure: Abdominal Aortogram;  Surgeon: Waynetta Sandy, MD;  Location: [place] [facility];  Service: Cardiovascular;  Laterality: N/A;  . ABDOMINAL AORTOGRAM N/A 04/08/2017   Procedure: ABDOMINAL AORTOGRAM;  Surgeon: Conrad [place] Butte, MD;  Location: [place] [facility];  Service: Cardiovascular;  Laterality: N/A;  . ABDOMINAL AORTOGRAM W/LOWER EXTREMITY N/A 02/23/2017   Procedure: Abdominal Aortogram w/Lower Extremity;  Surgeon: [name], MD;  Location: [place] [facility];  Service: Cardiovascular;  Laterality: N/A;  Rt. leg  . AMPUTATION Right 02/28/2017   Procedure: RIGHT BELOW KNEE AMPUTATION;  Surgeon: [name], MD;  Location: [place];  Service: Vascular;  Laterality: Right;  . AV FISTULA PLACEMENT     Hx: of  . AV FISTULA PLACEMENT Right 05/04/2013   Procedure: ARTERIOVENOUS (AV) FISTULA CREATION- RIGHT ARM;  Surgeon: Conrad [place], MD;  Location: [place];  Service: Vascular;  Laterality: Right;  Ultrasound guided  . AV FISTULA PLACEMENT Right 07/28/2016   Procedure: BRACHIOCEPHALIC ARTERIOVENOUS (AV) FISTULA CREATION;  Surgeon: [name], MD;  Location: [place];  Service: Vascular;  Laterality: Right;  . BACK  SURGERY    . CATARACT EXTRACTION W/PHACO Left 07/16/2014   Procedure: CATARACT EXTRACTION PHACO AND INTRAOCULAR LENS PLACEMENT (IOC);  Surgeon: [name], MD;  Location: [facility];  Service: Ophthalmology;  Laterality: Left;  CDE:8.86  . CATARACT EXTRACTION W/PHACO Right 07/30/2014   Procedure: CATARACT EXTRACTION PHACO AND INTRAOCULAR LENS PLACEMENT (IOC);  Surgeon: [name], MD;  Location: [facility];  Service: Ophthalmology;  Laterality: Right;  CDE 8.99  . COLONOSCOPY     Hx: of  . EYE SURGERY    . FISTULA SUPERFICIALIZATION Right 07/28/2016   Procedure: FISTULA SUPERFICIALIZATION;  Surgeon: [name], MD;  Location: [place];  Service: Vascular;  Laterality: Right;  . FISTULA SUPERFICIALIZATION Right 10/13/2016   Procedure: BRACHIOCEPHALIC  ARTERIOVENOUS FISTULA SUPERFICIALIZATION;  Surgeon: [name], MD;  Location: [place];  Service: Vascular;  Laterality: Right;  . FISTULOGRAM N/A 05/04/2013   Procedure: CENTRAL VENOGRAM;  Surgeon: Conrad Iola, MD;  Location: [place];  Service: Vascular;  Laterality: N/A;  . INSERTION OF DIALYSIS CATHETER Right 05/04/2013   Procedure: INSERTION OF DIALYSIS CATHETER;  Surgeon: Conrad Richwood, MD;  Location: [place];  Service: Vascular;  Laterality: Right;  Ultrasound guided  . INSERTION OF DIALYSIS CATHETER N/A 07/28/2016   Procedure: INSERTION OF DIALYSIS CATHETER;  Surgeon: [name], MD;  Location: [place];  Service: Vascular;  Laterality: N/A;  . IR FLUORO GUIDE CV LINE RIGHT  04/13/2017  . IR THROMBECTOMY AV FISTULA W/THROMBOLYSIS/PTA/STENT INC/SHUNT/IMG RT Right 09/07/2017  . IR US GUIDE VASC ACCESS RIGHT  04/13/2017  . IR US GUIDE VASC ACCESS RIGHT  09/07/2017  . LIGATION OF ARTERIOVENOUS  FISTULA Left 05/04/2013   Procedure: LIGATION OF LEFT RADIAL CEPHALIC ARTERIOVENOUS  FISTULA;  Surgeon: Conrad Trenton, MD;  Location: [place];  Service: Vascular;  Laterality: Left;  Ultrasound guided  . LIGATION OF ARTERIOVENOUS  FISTULA Right 07/28/2016   Procedure: LIGATION OF RADIOCEPHALIC ARTERIOVENOUS  FISTULA;  Surgeon: [name], MD;  Location: [place];  Service: Vascular;  Laterality: Right;  . LOWER EXTREMITY ANGIOGRAPHY N/A 12/30/2016   Procedure: Lower Extremity Angiography;  Surgeon: Waynetta Sandy, MD;  Location: [place] [facility];  Service: Cardiovascular;  Laterality: N/A;  . LUMBAR SPINE SURGERY    . PERIPHERAL VASCULAR ATHERECTOMY Right 12/30/2016   Procedure: Peripheral Vascular Atherectomy;  Surgeon: Waynetta Sandy, MD;  Location: [place] [facility];  Service: Cardiovascular;  Laterality: Right;  AT and PT  . PERIPHERAL VASCULAR BALLOON ANGIOPLASTY Right 12/30/2016   Procedure: Peripheral Vascular Balloon Angioplasty;  Surgeon: Waynetta Sandy, MD;  Location: [place] [facility];  Service: Cardiovascular;  Laterality: Right;  PTA of PT and AT  . PERIPHERAL VASCULAR BALLOON ANGIOPLASTY  02/23/2017   Procedure: Peripheral Vascular Balloon Angioplasty;  Surgeon: [name], MD;  Location: [place] [facility];  Service: Cardiovascular;;  RT. Anterior Tib.  [name] PERIPHERAL VASCULAR BALLOON ANGIOPLASTY  04/08/2017   Procedure: PERIPHERAL VASCULAR BALLOON ANGIOPLASTY;  Surgeon: Conrad Eastport, MD;  Location: [place] [facility];  Service: Cardiovascular;;  . PERIPHERAL VASCULAR CATHETERIZATION N/A 01/24/2015   Procedure: Fistulagram;  Surgeon: Conrad Craigmont, MD;  Location: [place] [facility];  Service: Cardiovascular;  Laterality: N/A;  . PERIPHERAL VASCULAR CATHETERIZATION Right 06/04/2016   Procedure: A/V Shuntogram/Fistulagram;  Surgeon: Conrad [place], MD;  Location: [place] [facility];  Service: Cardiovascular;  Laterality: Right;  . REVISON OF ARTERIOVENOUS FISTULA Right 01/02/2014   Procedure: REVISON OF  ARTERIOVENOUS FISTULA ANASTOMOSIS;  Surgeon: Conrad Flemington, MD;  Location: [place];  Service: Vascular;  Laterality: Right;  . REVISON OF ARTERIOVENOUS FISTULA Right 01/02/2016   Procedure: REVISION OF RADIOCEPHALIC ARTERIOVENOUS FISTULA  with BOVINE PATCH ANGIOPLASTY RIGHT RADIAL ARTERY;  Surgeon: Mal [name], MD;  Location: [place];  Service: Vascular;  Laterality: Right;  . SHUNTOGRAM N/A 11/07/2013   Procedure: Fistulogram;  Surgeon: [name], MD;  Location: [facility];  Service: Cardiovascular;  Laterality: N/A;  . THROMBECTOMY W/ EMBOLECTOMY Right 07/18/2017   Procedure: THROMBECTOMY ARTERIOVENOUS HERO GRAFT ARM;  Surgeon: [name], MD;  Location: [place];  Service: Vascular;  Laterality: Right;  . TRANSMETATARSAL AMPUTATION Right 01/01/2017   Procedure: TRANSMETATARSAL AMPUTATION;  Surgeon: [name], MD;  Location: [place];  Service: Vascular;  Laterality: Right;  [name] VASCULAR ACCESS DEVICE INSERTION Right 05/13/2017    Procedure: INSERTION OF HERO VASCULAR ACCESS DEVICE RIGHT UPPER ARM;  Surgeon: Waynetta Sandy, MD;  Location: [place];  Service: Vascular;  Laterality: Right;  . VENOGRAM N/A 05/13/2017   Procedure: CENTRAL VENOGRAM;  Surgeon: Waynetta Sandy, MD;  Location: [facility];  Service: Vascular;  Laterality: N/A;    Short Social History:  Social History   Tobacco Use  . Smoking status: Never Smoker  . Smokeless tobacco: Never Used  Substance Use Topics  . Alcohol use: Yes    Comment: 1- fifth of gin a week    Allergies  Allergen Reactions  . Bee Venom Anaphylaxis  . Iodine Swelling  . Penicillins Swelling and Other (See Comments)    SWELLING REACTION UNSPECIFIED   Has patient had a PCN reaction causing immediate rash, facial/tongue/throat swelling, SOB or lightheadedness with hypotension: No Has patient had a PCN reaction causing severe rash involving mucus membranes or skin necrosis: No Has patient had a PCN reaction that required hospitalization No Has patient had a PCN reaction occurring within the last 10 years: No If all of the above answers are "NO", then may proceed with Cephalosporin use.  . Sulfadiazine Other (See Comments)    1% Silver Sulfadiazine cream causes burning over a large area of skin.  [name] Plavix [Clopidogrel] Other (See Comments)    Caused Nose bleed    Current Outpatient Medications  Medication Sig Dispense Refill  . acetaminophen (TYLENOL) 500 MG tablet Take 500 mg by mouth every 6 (six) hours as needed for mild pain. Do not exceed 4 gms of tylenol in 24 hours    . aspirin EC 81 MG tablet Take 81 mg by mouth daily.    [name] atorvastatin (LIPITOR) 40 MG tablet Take 1 tablet (40 mg total) by mouth daily at 6 PM. 30 tablet 0  . B Complex-C-Folic Acid (DIALYVITE PO) Take 1 tablet by mouth every Monday, Wednesday, and Friday.    . bisacodyl (DULCOLAX) 5 MG EC tablet Take 2 tablets (10 mg total) by mouth daily as needed for moderate constipation. 30 tablet  0  . carvedilol (COREG) 12.5 MG tablet Take 1 tablet (12.5 mg total) by mouth See admin instructions. Take 1 tablet (12.5 mg) by mouth twice daily on Sunday, Tuesday, Thursday, Saturday (hold on dialysis days)    . cinacalcet (SENSIPAR) 30 MG tablet Take 30 mg by mouth daily with breakfast.     . EPINEPHrine (EPIPEN 2-PAK) 0.3 mg/0.3 mL IJ SOAJ injection Inject 0.3 mg into the muscle once as needed (anaphylaxis).    . finasteride (PROSCAR) 5 MG tablet Take 1  tablet (5 mg total) by mouth daily. 30 tablet 0  . Hypromellose (ARTIFICIAL TEARS) 0.4 % SOLN Place 1 drop into both eyes every 6 (six) hours as needed (dry eyes).    [name] LANTUS SOLOSTAR 100 UNIT/ML Solostar Pen Inject 15 Units into the skin at bedtime. 15 mL 0  . omeprazole (PRILOSEC) 40 MG capsule Take 40 mg by mouth daily.    . polyethylene glycol powder (GLYCOLAX/MIRALAX) powder Take 17 g by mouth daily. 255 g 0  . senna (SENOKOT) 8.6 MG TABS tablet Take 2 tablets (17.2 mg total) by mouth at bedtime. 120 each 0  . sevelamer carbonate (RENVELA) 800 MG tablet Take 2,400 mg by mouth 3 (three) times daily with meals.     [name] oxyCODONE-acetaminophen (ROXICET) 5-325 MG tablet Take 1-2 tablets by mouth every 6 (six) hours as needed. (Patient not taking: Reported on 11/11/2017) 10 tablet 0   No current facility-administered medications for this visit.     REVIEW OF SYSTEMS  Denies fever Has occasional right upper extremity pain Able to dialyze via fistula     Objective:  Objective   Vitals:   11/11/17 1556 11/11/17 1558  BP: (!) 163/72 (!) 160/70  Pulse: 74   Resp: 20   Temp: 97.6 F (36.4 C)   TempSrc: Oral   SpO2: 96%   Weight: 263 lb 9.6 oz (119.6 kg)   Height: 6\' 2"  (1.88 m)    Body mass index is 33.84 kg/m.  Physical Exam  Constitutional: He is oriented to person, place, and time. He appears well-developed.  HENT:  Head: Normocephalic.  Eyes: Pupils are equal, round, and reactive to light.  Neck: Normal range of motion.    Cardiovascular: Normal rate.  Pulmonary/Chest: Effort normal.  Abdominal: Soft.  Musculoskeletal: Normal range of motion. He exhibits no edema.  His right arm has multiple well-healed scars in the upper aspect There is a suture on the medial aspect of his upper arm that is in place There is a palpable thrill throughout the shunt in the right upper extremity  Neurological: He is alert and oriented to person, place, and time.  Skin: Skin is warm and dry.  Psychiatric: He has a normal mood and affect. His behavior is normal. Judgment and thought content normal.         Assessment/Plan:     67 year old male who dialyzes via a hero graft in his right upper extremity.  He recently had bleeding and had a stitch placed.  For that reason we will perform shuntogram next week we can remove the stitch at that time case of bleeds.  He remains on antibiotics at dialysis.  He does not take blood thinners.  He demonstrates good understanding we will get him set up today.     Waynetta Sandy MD [facility]

## 2017-11-12 DIAGNOSIS — D631 Anemia in chronic kidney disease: Secondary | ICD-10-CM | POA: Diagnosis not present

## 2017-11-12 DIAGNOSIS — Z992 Dependence on renal dialysis: Secondary | ICD-10-CM | POA: Diagnosis not present

## 2017-11-12 DIAGNOSIS — N2581 Secondary hyperparathyroidism of renal origin: Secondary | ICD-10-CM | POA: Diagnosis not present

## 2017-11-12 DIAGNOSIS — N186 End stage renal disease: Secondary | ICD-10-CM | POA: Diagnosis not present

## 2017-11-12 DIAGNOSIS — D509 Iron deficiency anemia, unspecified: Secondary | ICD-10-CM | POA: Diagnosis not present

## 2017-11-15 ENCOUNTER — Encounter (HOSPITAL_COMMUNITY): Payer: Self-pay | Admitting: Emergency Medicine

## 2017-11-15 ENCOUNTER — Other Ambulatory Visit: Payer: Self-pay

## 2017-11-15 ENCOUNTER — Emergency Department (HOSPITAL_COMMUNITY)
Admission: EM | Admit: 2017-11-15 | Discharge: 2017-11-15 | Disposition: A | Discharge status: Home / Self Care | Payer: Medicare Other | Attending: Emergency Medicine | Admitting: Emergency Medicine

## 2017-11-15 ENCOUNTER — Emergency Department (HOSPITAL_COMMUNITY): Payer: Medicare Other

## 2017-11-15 DIAGNOSIS — R079 Chest pain, unspecified: Secondary | ICD-10-CM | POA: Diagnosis not present

## 2017-11-15 DIAGNOSIS — E1122 Type 2 diabetes mellitus with diabetic chronic kidney disease: Secondary | ICD-10-CM | POA: Insufficient documentation

## 2017-11-15 DIAGNOSIS — I120 Hypertensive chronic kidney disease with stage 5 chronic kidney disease or end stage renal disease: Secondary | ICD-10-CM | POA: Diagnosis not present

## 2017-11-15 DIAGNOSIS — E1151 Type 2 diabetes mellitus with diabetic peripheral angiopathy without gangrene: Secondary | ICD-10-CM | POA: Insufficient documentation

## 2017-11-15 DIAGNOSIS — M25511 Pain in right shoulder: Secondary | ICD-10-CM | POA: Diagnosis not present

## 2017-11-15 DIAGNOSIS — M549 Dorsalgia, unspecified: Secondary | ICD-10-CM

## 2017-11-15 DIAGNOSIS — N186 End stage renal disease: Secondary | ICD-10-CM | POA: Diagnosis not present

## 2017-11-15 DIAGNOSIS — Z7982 Long term (current) use of aspirin: Secondary | ICD-10-CM | POA: Diagnosis not present

## 2017-11-15 DIAGNOSIS — Z794 Long term (current) use of insulin: Secondary | ICD-10-CM | POA: Insufficient documentation

## 2017-11-15 DIAGNOSIS — Z8673 Personal history of transient ischemic attack (TIA), and cerebral infarction without residual deficits: Secondary | ICD-10-CM | POA: Insufficient documentation

## 2017-11-15 DIAGNOSIS — Z992 Dependence on renal dialysis: Secondary | ICD-10-CM | POA: Diagnosis not present

## 2017-11-15 DIAGNOSIS — Z79899 Other long term (current) drug therapy: Secondary | ICD-10-CM | POA: Insufficient documentation

## 2017-11-15 DIAGNOSIS — M546 Pain in thoracic spine: Secondary | ICD-10-CM | POA: Insufficient documentation

## 2017-11-15 LAB — BASIC METABOLIC PANEL
Anion gap: 16 — ABNORMAL HIGH (ref 5–15)
BUN: 53 mg/dL — AB (ref 6–20)
CHLORIDE: 95 mmol/L — AB (ref 101–111)
CO2: 22 mmol/L (ref 22–32)
CREATININE: 10.88 mg/dL — AB (ref 0.61–1.24)
Calcium: 8.6 mg/dL — ABNORMAL LOW (ref 8.9–10.3)
GFR calc Af Amer: 5 mL/min — ABNORMAL LOW (ref >60)
GFR, EST NON AFRICAN AMERICAN: 4 mL/min — AB (ref >60)
Glucose, Bld: 104 mg/dL — ABNORMAL HIGH (ref 65–99)
Potassium: 3.8 mmol/L (ref 3.5–5.1)
SODIUM: 133 mmol/L — AB (ref 135–145)

## 2017-11-15 LAB — CBC
HCT: 37.4 % — ABNORMAL LOW (ref 39.0–52.0)
Hemoglobin: 11.9 g/dL — ABNORMAL LOW (ref 13.0–17.0)
MCH: 30.4 pg (ref 26.0–34.0)
MCHC: 31.8 g/dL (ref 30.0–36.0)
MCV: 95.4 fL (ref 78.0–100.0)
PLATELETS: 239 10*3/uL (ref 150–400)
RBC: 3.92 MIL/uL — ABNORMAL LOW (ref 4.22–5.81)
RDW: 13.7 % (ref 11.5–15.5)
WBC: 8.5 10*3/uL (ref 4.0–10.5)

## 2017-11-15 LAB — I-STAT TROPONIN, ED: Troponin i, poc: 0 ng/mL (ref 0.00–0.08)

## 2017-11-15 MED ORDER — TRAMADOL HCL 50 MG PO TABS
50.0000 mg | ORAL_TABLET | Freq: Once | ORAL | Status: AC
  Administered 2017-11-15: 50 mg via ORAL
  Filled 2017-11-15: qty 1

## 2017-11-15 MED ORDER — TRAMADOL HCL 50 MG PO TABS: 50.0000 mg | ORAL_TABLET | Freq: Three times a day (TID) | ORAL | 0 refills | Status: DC | PRN

## 2017-11-15 MED ORDER — IBUPROFEN 400 MG PO TABS
400.0000 mg | ORAL_TABLET | Freq: Once | ORAL | Status: AC
  Administered 2017-11-15: 400 mg via ORAL
  Filled 2017-11-15: qty 1

## 2017-11-15 NOTE — ED Provider Notes (Signed)
[place] EMERGENCY DEPARTMENT Provider Note   CSN: [id number] Arrival date & time: 11/15/17  5027     History   Chief Complaint Chief Complaint  Patient presents with  . Shoulder Pain    HPI Jacob [name] is a 67 y.o. male.  Patient w hx esrd on hd, c/o right shoulder pain for past couple days. Pain dull, moderate, worse when he tries to raise arm/shoulder above 90 degrees. Denies trauma or injury to area. Also c/o pain across bilateral trapezius area, w soreness when rotates neck right and left. Denies radicular pain down arm. No numbness/weakness. Denies headache. Patient denies chest pain. No sob. Goes to dialysis M/W/F - states had normal hd Friday, but did not go at 8 AM today. No fever or chills. No swelling/pain to right upper arm shunt site.   The history is provided by the patient.  Chest Pain   Pertinent negatives include no abdominal pain, no back pain, no cough, no fever, no headaches, no shortness of breath and no vomiting.    Past Medical History:  Diagnosis Date  . Anemia in chronic kidney disease(285.21)   . Anxiety   . Arthritis   . Depression   . ESRD (end stage renal disease) on dialysis [facility])    "MWF; [facility] [place]" (02/18/2017)  . Essential hypertension   . GERD (gastroesophageal reflux disease)   . Glaucoma   . History of blood transfusion   . History of stroke 2016  . Insomnia   . Nonischemic cardiomyopathy ([place])   . Type 2 diabetes mellitus [facility])     Patient Active Problem List   Diagnosis Date Noted  . ESRD (end stage renal disease) ([place]) 07/17/2017  . Hypothyroidism   . Bradycardia 04/09/2017  . Hematoma of groin   . Swelling of arm 03/18/2017  . PAD (peripheral artery disease) ([place])   . Acute blood loss anemia   . Anemia of chronic disease   . DNR (do not resuscitate) 02/24/2017  . Palliative care by specialist 02/24/2017  . Normocytic anemia 02/18/2017  . Hypokalemia 12/28/2016  . Insulin-requiring or dependent  type II diabetes mellitus ([place]) 12/28/2016  . Gangrene of foot ([place]) 12/28/2016  . Gangrene of right foot ([place]) 12/28/2016  . Hyperkalemia 04/05/2016  . Inadequate dialysis 04/05/2016  . Other complications due to renal dialysis device, implant, and graft 04/28/2013  . Subclavian vein occlusion ([facility]) 04/28/2013  . Preoperative cardiovascular examination 12/28/2012  . Mixed hyperlipidemia 06/16/2011  . Nonischemic cardiomyopathy ([place]) 11/20/2010  . End-stage renal disease on hemodialysis ([place]) 11/20/2010  . Essential hypertension, benign 11/20/2010    Past Surgical History:  Procedure Laterality Date  . A/V FISTULAGRAM Right 10/06/2016   Procedure: A/V Fistulagram;  Surgeon: [name], MD;  Location: [place] CV LAB;  Service: Cardiovascular;  Laterality: Right;  . A/V FISTULAGRAM Right 04/27/2017   Procedure: A/V Fistulagram;  Surgeon: [name], MD;  Location: [place] CV LAB;  Service: Cardiovascular;  Laterality: Right;  . ABDOMINAL AORTOGRAM N/A 12/30/2016   Procedure: Abdominal Aortogram;  Surgeon: Waynetta Sandy, MD;  Location: [place] CV LAB;  Service: Cardiovascular;  Laterality: N/A;  . ABDOMINAL AORTOGRAM N/A 04/08/2017   Procedure: ABDOMINAL AORTOGRAM;  Surgeon: Conrad Sanger, MD;  Location: [place] CV LAB;  Service: Cardiovascular;  Laterality: N/A;  . ABDOMINAL AORTOGRAM W/LOWER EXTREMITY N/A 02/23/2017   Procedure: Abdominal Aortogram w/Lower Extremity;  Surgeon: [name], MD;  Location: [place] CV LAB;  Service: Cardiovascular;  Laterality: N/A;  Rt. leg  . AMPUTATION Right 02/28/2017   Procedure: RIGHT BELOW KNEE AMPUTATION;  Surgeon: [name], MD;  Location: [place];  Service: Vascular;  Laterality: Right;  . AV FISTULA PLACEMENT     Hx: of  . AV FISTULA PLACEMENT Right 05/04/2013   Procedure: ARTERIOVENOUS (AV) FISTULA CREATION- RIGHT ARM;  Surgeon: Conrad [place], MD;  Location: [place];  Service: Vascular;  Laterality: Right;   Ultrasound guided  . AV FISTULA PLACEMENT Right 07/28/2016   Procedure: BRACHIOCEPHALIC ARTERIOVENOUS (AV) FISTULA CREATION;  Surgeon: [name], MD;  Location: [place];  Service: Vascular;  Laterality: Right;  . BACK SURGERY    . CATARACT EXTRACTION W/PHACO Left 07/16/2014   Procedure: CATARACT EXTRACTION PHACO AND INTRAOCULAR LENS PLACEMENT (IOC);  Surgeon: [name], MD;  [facility]: [facility];  Service: Ophthalmology;  Laterality: Left;  CDE:8.86  . CATARACT EXTRACTION W/PHACO Right 07/30/2014   Procedure: CATARACT EXTRACTION PHACO AND INTRAOCULAR LENS PLACEMENT (IOC);  Surgeon: [name], MD;  Location: [facility];  Service: Ophthalmology;  Laterality: Right;  CDE 8.99  . COLONOSCOPY     Hx: of  . EYE SURGERY    . FISTULA SUPERFICIALIZATION Right 07/28/2016   Procedure: FISTULA SUPERFICIALIZATION;  Surgeon: [name], MD;  Location: [place];  Service: Vascular;  Laterality: Right;  . FISTULA SUPERFICIALIZATION Right 10/13/2016   Procedure: BRACHIOCEPHALIC ARTERIOVENOUS FISTULA SUPERFICIALIZATION;  Surgeon: [name], MD;  Location: [place];  Service: Vascular;  Laterality: Right;  . FISTULOGRAM N/A 05/04/2013   Procedure: CENTRAL VENOGRAM;  Surgeon: Conrad [place], MD;  Location: [place];  Service: Vascular;  Laterality: N/A;  . INSERTION OF DIALYSIS CATHETER Right 05/04/2013   Procedure: INSERTION OF DIALYSIS CATHETER;  Surgeon: Conrad [place]way, MD;  Location: [place];  Service: Vascular;  Laterality: Right;  Ultrasound guided  . INSERTION OF DIALYSIS CATHETER N/A 07/28/2016   Procedure: INSERTION OF DIALYSIS CATHETER;  Surgeon: [name], MD;  Location: [place];  Service: Vascular;  Laterality: N/A;  . IR FLUORO GUIDE CV LINE RIGHT  04/13/2017  . IR THROMBECTOMY AV FISTULA W/THROMBOLYSIS/PTA/STENT INC/SHUNT/IMG RT Right 09/07/2017  . IR US GUIDE VASC ACCESS RIGHT  04/13/2017  . IR US GUIDE VASC ACCESS RIGHT  09/07/2017  . LIGATION OF ARTERIOVENOUS  FISTULA Left  05/04/2013   Procedure: LIGATION OF LEFT RADIAL CEPHALIC ARTERIOVENOUS  FISTULA;  Surgeon: Conrad Waldo, MD;  Location: [place];  Service: Vascular;  Laterality: Left;  Ultrasound guided  . LIGATION OF ARTERIOVENOUS  FISTULA Right 07/28/2016   Procedure: LIGATION OF RADIOCEPHALIC ARTERIOVENOUS  FISTULA;  Surgeon: [name], MD;  Location: [place];  Service: Vascular;  Laterality: Right;  . LOWER EXTREMITY ANGIOGRAPHY N/A 12/30/2016   Procedure: Lower Extremity Angiography;  Surgeon: Waynetta Sandy, MD;  Location: [place] CV LAB;  Service: Cardiovascular;  Laterality: N/A;  . LUMBAR SPINE SURGERY    . PERIPHERAL VASCULAR ATHERECTOMY Right 12/30/2016   Procedure: Peripheral Vascular Atherectomy;  Surgeon: Waynetta Sandy, MD;  Location: [place] CV LAB;  Service: Cardiovascular;  Laterality: Right;  AT and PT  . PERIPHERAL VASCULAR BALLOON ANGIOPLASTY Right 12/30/2016   Procedure: Peripheral Vascular Balloon Angioplasty;  Surgeon: Waynetta Sandy, MD;  Location: [place] CV LAB;  Service: Cardiovascular;  Laterality: Right;  PTA of PT and AT  . PERIPHERAL VASCULAR BALLOON ANGIOPLASTY  02/23/2017   Procedure: Peripheral Vascular Balloon Angioplasty;  Surgeon: [name],  MD;  Location: [place] CV LAB;  Service: Cardiovascular;;  RT. Anterior Tib.  [name] PERIPHERAL VASCULAR BALLOON ANGIOPLASTY  04/08/2017   Procedure: PERIPHERAL VASCULAR BALLOON ANGIOPLASTY;  Surgeon: Conrad [place]noka, MD;  Location: [place] CV LAB;  Service: Cardiovascular;;  . PERIPHERAL VASCULAR CATHETERIZATION N/A 01/24/2015   Procedure: Fistulagram;  Surgeon: Conrad [place], MD;  Location: [place] CV LAB;  Service: Cardiovascular;  Laterality: N/A;  . PERIPHERAL VASCULAR CATHETERIZATION Right 06/04/2016   Procedure: A/V Shuntogram/Fistulagram;  Surgeon: Conrad [place], MD;  Location: [place] CV LAB;  Service: Cardiovascular;  Laterality: Right;  . REVISON OF ARTERIOVENOUS FISTULA  Right 01/02/2014   Procedure: REVISON OF ARTERIOVENOUS FISTULA ANASTOMOSIS;  Surgeon: Conrad , MD;  Location: [place];  Service: Vascular;  Laterality: Right;  . REVISON OF ARTERIOVENOUS FISTULA Right 01/02/2016   Procedure: REVISION OF RADIOCEPHALIC ARTERIOVENOUS FISTULA  with BOVINE PATCH ANGIOPLASTY RIGHT RADIAL ARTERY;  Surgeon: Mal [name], MD;  Location: [place];  Service: Vascular;  Laterality: Right;  . SHUNTOGRAM N/A 11/07/2013   Procedure: Fistulogram;  Surgeon: [name], MD;  Location: [facility] CATH LAB;  Service: Cardiovascular;  Laterality: N/A;  . THROMBECTOMY W/ EMBOLECTOMY Right 07/18/2017   Procedure: THROMBECTOMY ARTERIOVENOUS HERO GRAFT ARM;  Surgeon: [name], MD;  Location: [place];  Service: Vascular;  Laterality: Right;  . TRANSMETATARSAL AMPUTATION Right 01/01/2017   Procedure: TRANSMETATARSAL AMPUTATION;  Surgeon: [name], MD;  Location: [place];  Service: Vascular;  Laterality: Right;  [name] VASCULAR ACCESS DEVICE INSERTION Right 05/13/2017   Procedure: INSERTION OF HERO VASCULAR ACCESS DEVICE RIGHT UPPER ARM;  Surgeon: Waynetta Sandy, MD;  Location: [place];  Service: Vascular;  Laterality: Right;  . VENOGRAM N/A 05/13/2017   Procedure: CENTRAL VENOGRAM;  Surgeon: Waynetta Sandy, MD;  Location: [place];  Service: Vascular;  Laterality: N/A;        Home Medications    Prior to Admission medications   Medication Sig Start Date End Date Taking? Authorizing Provider  acetaminophen (TYLENOL) 500 MG tablet Take 500 mg by mouth every 6 (six) hours as needed for mild pain. Do not exceed 4 gms of tylenol in 24 hours    [provider]  aspirin EC 81 MG tablet Take 81 mg by mouth daily.    [provider]  atorvastatin (LIPITOR) 40 MG tablet Take 1 tablet (40 mg total) by mouth daily at 6 PM. 02/26/17   Janece Canterbury, MD  B Complex-C-Folic Acid (DIALYVITE PO) Take 1 tablet by mouth every Monday, Wednesday, and Friday.    [provider]  bisacodyl (DULCOLAX) 5 MG EC tablet Take 2 tablets (10 mg total) by mouth daily as needed for moderate constipation. 01/04/17   Mikhail, Velta Addison, DO  carvedilol (COREG) 12.5 MG tablet Take 1 tablet (12.5 mg total) by mouth See admin instructions. Take 1 tablet (12.5 mg) by mouth twice daily on Sunday, Tuesday, Thursday, Saturday (hold on dialysis days) 04/10/17   Barton Dubois, MD  cinacalcet (SENSIPAR) 30 MG tablet Take 30 mg by mouth daily with breakfast.     [provider]  EPINEPHrine (EPIPEN 2-PAK) 0.3 mg/0.3 mL IJ SOAJ injection Inject 0.3 mg into the muscle once as needed (anaphylaxis).    [provider]  finasteride (PROSCAR) 5 MG tablet Take 1 tablet (5 mg total) by mouth daily. 02/27/17   Janece Canterbury, MD  Hypromellose (ARTIFICIAL TEARS) 0.4 % SOLN Place 1 drop into both eyes every  6 (six) hours as needed (dry eyes).    [provider]  LANTUS SOLOSTAR 100 UNIT/ML Solostar Pen Inject 15 Units into the skin at bedtime. 03/02/17   Janece Canterbury, MD  omeprazole (PRILOSEC) 40 MG capsule Take 40 mg by mouth daily.    [provider]  oxyCODONE-acetaminophen (ROXICET) 5-325 MG tablet Take 1-2 tablets by mouth every 6 (six) hours as needed. Patient not taking: Reported on 11/11/2017 05/13/17 05/13/18  Waynetta Sandy, MD  polyethylene glycol powder [facility]) powder Take 17 g by mouth daily. 02/26/17   Janece Canterbury, MD  senna (SENOKOT) 8.6 MG TABS tablet Take 2 tablets (17.2 mg total) by mouth at bedtime. 02/26/17   Janece Canterbury, MD  sevelamer carbonate (RENVELA) 800 MG tablet Take 2,400 mg by mouth 3 (three) times daily with meals.     [provider]    Family History Family History  Problem Relation Age of Onset  . Heart attack Brother        61  . Heart disease Brother        before age 48  . Hypertension Brother   . Heart attack Brother        23  . Heart attack Brother        58  . Diabetes  Mother   . Hypertension Mother   . Heart disease Mother   . Heart disease Father   . Hypertension Father   . Other Father        amputation  . Hypertension Sister   . Heart disease Sister   . Vision loss Maternal Uncle     Social History Social History   Tobacco Use  . Smoking status: Never Smoker  . Smokeless tobacco: Never Used  Substance Use Topics  . Alcohol use: Yes    Comment: 1- fifth of gin a week  . Drug use: No     Allergies   Bee venom; Iodine; Penicillins; Sulfadiazine; and Plavix [clopidogrel]   Review of Systems Review of Systems  Constitutional: Negative for fever.  HENT: Negative for sore throat.   Eyes: Negative for redness.  Respiratory: Negative for cough and shortness of breath.   Cardiovascular: Negative for chest pain and leg swelling.  Gastrointestinal: Negative for abdominal pain and vomiting.  Genitourinary: Negative for flank pain.  Musculoskeletal: Negative for back pain.       Neck/upper back pain, right shoulder pain  Skin: Negative for rash.  Neurological: Negative for headaches.  Hematological: Does not bruise/bleed easily.  Psychiatric/Behavioral: Negative for confusion.     Physical Exam Updated Vital Signs BP 136/68 (BP Location: Right Arm)   Pulse 72   Temp 98 F (36.7 C) (Oral)   Resp 16   SpO2 100%   Physical Exam  Constitutional: He is oriented to person, place, and time. He appears well-developed and well-nourished. No distress.  HENT:  Mouth/Throat: Oropharynx is clear and moist.  Eyes: Conjunctivae are normal.  Neck: Neck supple. No tracheal deviation present.  Cardiovascular: Normal rate, regular rhythm, normal heart sounds and intact distal pulses.  Pulmonary/Chest: Effort normal and breath sounds normal. No accessory muscle usage. No respiratory distress.  Abdominal: Soft. He exhibits no distension. There is no tenderness.  Genitourinary:  Genitourinary Comments: No cva tenderness  Musculoskeletal: He  exhibits no edema.  Pain with raising right shoulder/arm, reproducing symptoms. No pain w slow passive rom shoulder. No erythema or increased warmth to area. C/T spine non tender, aligned. Bilateral, lateral neck and  trapezius muscular tenderness. No sts noted. Good rom neck - no rigidity.  RUE shunt with palp thrill. Single suture in place without sign of infection. No arm swelling. No erythema or sign of infection.   Neurological: He is alert and oriented to person, place, and time.  Skin: Skin is warm and dry. No rash noted. He is not diaphoretic.  Psychiatric: He has a normal mood and affect.  Nursing note and vitals reviewed.    ED Treatments / Results  Labs (all labs ordered are listed, but only abnormal results are displayed) Results for orders placed or performed during the hospital encounter of 78/29/56  Basic metabolic panel  Result Value Ref Range   Sodium 133 (L) 135 - 145 mmol/L   Potassium 3.8 3.5 - 5.1 mmol/L   Chloride 95 (L) 101 - 111 mmol/L   CO2 22 22 - 32 mmol/L   Glucose, Bld 104 (H) 65 - 99 mg/dL   BUN 53 (H) 6 - 20 mg/dL   Creatinine, Ser 10.88 (H) 0.61 - 1.24 mg/dL   Calcium 8.6 (L) 8.9 - 10.3 mg/dL   GFR calc non Af Amer 4 (L) >60 mL/min   GFR calc Af Amer 5 (L) >60 mL/min   Anion gap 16 (H) 5 - 15  CBC  Result Value Ref Range   WBC 8.5 4.0 - 10.5 K/uL   RBC 3.92 (L) 4.22 - 5.81 MIL/uL   Hemoglobin 11.9 (L) 13.0 - 17.0 g/dL   HCT 37.4 (L) 39.0 - 52.0 %   MCV 95.4 78.0 - 100.0 fL   MCH 30.4 26.0 - 34.0 pg   MCHC 31.8 30.0 - 36.0 g/dL   RDW 13.7 11.5 - 15.5 %   Platelets 239 150 - 400 K/uL  I-stat troponin, ED  Result Value Ref Range   Troponin i, poc 0.00 0.00 - 0.08 ng/mL   Comment 3           Dg Chest 2 View  Result Date: 11/15/2017 CLINICAL DATA:  Substernal chest pain EXAM: CHEST - 2 VIEW COMPARISON:  04/09/2017 FINDINGS: Left subclavian region stent as seen previously. Right internal jugular catheter in place. Heart is enlarged. Lateral view  suggests patchy density at the left lung base with volume loss that could go along with atelectasis or pneumonia. The remainder of the chest is clear. No acute bone finding. IMPRESSION: Abnormal left lower lobe density, best seen on the lateral view. This could be atelectasis and/or pneumonia. Electronically Signed   By: Nelson Chimes M.D.   On: 11/15/2017 09:45    EKG EKG Interpretation  Date/Time:  Monday November 15 2017 08:55:21 EDT Ventricular Rate:  71 PR Interval:  262 QRS Duration: 120 QT Interval:  436 QTC Calculation: 473 R Axis:   -97 Text Interpretation:  Sinus rhythm with 1st degree A-V block Right superior axis deviation Non-specific intra-ventricular conduction delay No significant change since last tracing Confirmed by Lajean Saver [phone]) on 11/15/2017 10:03:41 AM   Radiology Dg Chest 2 View  Result Date: 11/15/2017 CLINICAL DATA:  Substernal chest pain EXAM: CHEST - 2 VIEW COMPARISON:  04/09/2017 FINDINGS: Left subclavian region stent as seen previously. Right internal jugular catheter in place. Heart is enlarged. Lateral view suggests patchy density at the left lung base with volume loss that could go along with atelectasis or pneumonia. The remainder of the chest is clear. No acute bone finding. IMPRESSION: Abnormal left lower lobe density, best seen on the lateral view. This could be atelectasis  and/or pneumonia. Electronically Signed   By: Nelson Chimes M.D.   On: 11/15/2017 09:45    Procedures Procedures (including critical care time)  Medications Ordered in ED Medications  traMADol (ULTRAM) tablet 50 mg (has no administration in time range)     Initial Impression / Assessment and Plan / ED Course  I have reviewed the triage vital signs and the nursing notes.  Pertinent labs & imaging results that were available during my care of the patient were reviewed by me and considered in my medical decision making (see chart for details).  Labs sent from triage.   After  symptoms for past day, trop is 0. Pt denies any chest pain. Pt does report pain to right shoulder that is worse w active rom shoulder, especially with abduction and trying to raise RUE above shoulder level. Trapezius muscular tenderness as well.  These symptoms appears most c/w musculoskeletal pain.   Due for hd today, pulse ox is 98%. No increased wob. K is normal. Will ask staff to contact patient hd center to see if able to get in later this AM/today.    Ultram po for pain. Motrin po.  Recheck pt comfortable. No distress. No sob or increased wob. No chest pain.   Final Clinical Impressions(s) / ED Diagnoses   Final diagnoses:  None    ED Discharge Orders    None       Lajean Saver, MD 11/17/17 1302

## 2017-11-15 NOTE — ED Triage Notes (Signed)
Pt states yesterday morning he began to have substernal chest pain that goes into her shoulders and upper back. Pts pain is currently 10/10. Pt has a cath cardiac scheduled for Wednesday.

## 2017-11-15 NOTE — Discharge Instructions (Signed)
It was our pleasure to provide your ER care today - we hope that you feel better.  Go to your dialysis center today for dialysis - call them to verify time.   Follow up this week for your vascular study as arranged.  Take acetaminophen as need for pain. You may also take ultram as need for pain - no driving when taking.  Follow up with your doctor in the next few days for recheck.  Return to ER if worse, new symptoms, fevers, intractable pain, arm numbness/weakness, chest pain, trouble breathing, other concern.

## 2017-11-16 DIAGNOSIS — D631 Anemia in chronic kidney disease: Secondary | ICD-10-CM | POA: Diagnosis not present

## 2017-11-16 DIAGNOSIS — N2581 Secondary hyperparathyroidism of renal origin: Secondary | ICD-10-CM | POA: Diagnosis not present

## 2017-11-16 DIAGNOSIS — Z992 Dependence on renal dialysis: Secondary | ICD-10-CM | POA: Diagnosis not present

## 2017-11-16 DIAGNOSIS — D509 Iron deficiency anemia, unspecified: Secondary | ICD-10-CM | POA: Diagnosis not present

## 2017-11-16 DIAGNOSIS — N186 End stage renal disease: Secondary | ICD-10-CM | POA: Diagnosis not present

## 2017-11-17 ENCOUNTER — Ambulatory Visit (HOSPITAL_COMMUNITY)
Admission: RE | Admit: 2017-11-17 | Discharge: 2017-11-17 | Disposition: A | Discharge status: Home / Self Care | Payer: Medicare Other | Source: Ambulatory Visit | Attending: Vascular Surgery | Admitting: Vascular Surgery

## 2017-11-17 ENCOUNTER — Encounter (HOSPITAL_COMMUNITY): Payer: Self-pay | Admitting: Vascular Surgery

## 2017-11-17 ENCOUNTER — Encounter (HOSPITAL_COMMUNITY)
Admission: RE | Disposition: A | Discharge status: Home / Self Care | Payer: Self-pay | Source: Ambulatory Visit | Attending: Vascular Surgery

## 2017-11-17 DIAGNOSIS — M1990 Unspecified osteoarthritis, unspecified site: Secondary | ICD-10-CM | POA: Diagnosis not present

## 2017-11-17 DIAGNOSIS — T82898A Other specified complication of vascular prosthetic devices, implants and grafts, initial encounter: Secondary | ICD-10-CM | POA: Diagnosis not present

## 2017-11-17 DIAGNOSIS — Z9889 Other specified postprocedural states: Secondary | ICD-10-CM | POA: Diagnosis not present

## 2017-11-17 DIAGNOSIS — Z89511 Acquired absence of right leg below knee: Secondary | ICD-10-CM | POA: Diagnosis not present

## 2017-11-17 DIAGNOSIS — I120 Hypertensive chronic kidney disease with stage 5 chronic kidney disease or end stage renal disease: Secondary | ICD-10-CM | POA: Diagnosis not present

## 2017-11-17 DIAGNOSIS — Y832 Surgical operation with anastomosis, bypass or graft as the cause of abnormal reaction of the patient, or of later complication, without mention of misadventure at the time of the procedure: Secondary | ICD-10-CM | POA: Insufficient documentation

## 2017-11-17 DIAGNOSIS — Z9841 Cataract extraction status, right eye: Secondary | ICD-10-CM | POA: Diagnosis not present

## 2017-11-17 DIAGNOSIS — Z79899 Other long term (current) drug therapy: Secondary | ICD-10-CM | POA: Insufficient documentation

## 2017-11-17 DIAGNOSIS — H409 Unspecified glaucoma: Secondary | ICD-10-CM | POA: Insufficient documentation

## 2017-11-17 DIAGNOSIS — G4700 Insomnia, unspecified: Secondary | ICD-10-CM | POA: Diagnosis not present

## 2017-11-17 DIAGNOSIS — Z794 Long term (current) use of insulin: Secondary | ICD-10-CM | POA: Diagnosis not present

## 2017-11-17 DIAGNOSIS — Z882 Allergy status to sulfonamides status: Secondary | ICD-10-CM | POA: Insufficient documentation

## 2017-11-17 DIAGNOSIS — T82590A Other mechanical complication of surgically created arteriovenous fistula, initial encounter: Secondary | ICD-10-CM | POA: Diagnosis not present

## 2017-11-17 DIAGNOSIS — I429 Cardiomyopathy, unspecified: Secondary | ICD-10-CM | POA: Diagnosis not present

## 2017-11-17 DIAGNOSIS — Z833 Family history of diabetes mellitus: Secondary | ICD-10-CM | POA: Diagnosis not present

## 2017-11-17 DIAGNOSIS — E1122 Type 2 diabetes mellitus with diabetic chronic kidney disease: Secondary | ICD-10-CM | POA: Insufficient documentation

## 2017-11-17 DIAGNOSIS — Z955 Presence of coronary angioplasty implant and graft: Secondary | ICD-10-CM | POA: Insufficient documentation

## 2017-11-17 DIAGNOSIS — Z880 Allergy status to penicillin: Secondary | ICD-10-CM | POA: Insufficient documentation

## 2017-11-17 DIAGNOSIS — K219 Gastro-esophageal reflux disease without esophagitis: Secondary | ICD-10-CM | POA: Insufficient documentation

## 2017-11-17 DIAGNOSIS — Z992 Dependence on renal dialysis: Secondary | ICD-10-CM | POA: Diagnosis not present

## 2017-11-17 DIAGNOSIS — Z91030 Bee allergy status: Secondary | ICD-10-CM | POA: Diagnosis not present

## 2017-11-17 DIAGNOSIS — Z888 Allergy status to other drugs, medicaments and biological substances status: Secondary | ICD-10-CM | POA: Diagnosis not present

## 2017-11-17 DIAGNOSIS — N186 End stage renal disease: Secondary | ICD-10-CM | POA: Insufficient documentation

## 2017-11-17 DIAGNOSIS — Z9842 Cataract extraction status, left eye: Secondary | ICD-10-CM | POA: Insufficient documentation

## 2017-11-17 DIAGNOSIS — Z7982 Long term (current) use of aspirin: Secondary | ICD-10-CM | POA: Insufficient documentation

## 2017-11-17 DIAGNOSIS — Z8673 Personal history of transient ischemic attack (TIA), and cerebral infarction without residual deficits: Secondary | ICD-10-CM | POA: Insufficient documentation

## 2017-11-17 DIAGNOSIS — Z8249 Family history of ischemic heart disease and other diseases of the circulatory system: Secondary | ICD-10-CM | POA: Insufficient documentation

## 2017-11-17 HISTORY — PX: A/V SHUNTOGRAM: CATH118297

## 2017-11-17 LAB — POCT I-STAT, CHEM 8
BUN: 36 mg/dL — ABNORMAL HIGH (ref 6–20)
CHLORIDE: 93 mmol/L — AB (ref 101–111)
Calcium, Ion: 1.03 mmol/L — ABNORMAL LOW (ref 1.15–1.40)
Creatinine, Ser: 7.8 mg/dL — ABNORMAL HIGH (ref 0.61–1.24)
GLUCOSE: 192 mg/dL — AB (ref 65–99)
HCT: 38 % — ABNORMAL LOW (ref 39.0–52.0)
HEMOGLOBIN: 12.9 g/dL — AB (ref 13.0–17.0)
Potassium: 3.9 mmol/L (ref 3.5–5.1)
SODIUM: 135 mmol/L (ref 135–145)
TCO2: 30 mmol/L (ref 22–32)

## 2017-11-17 LAB — GLUCOSE, CAPILLARY: Glucose-Capillary: 166 mg/dL — ABNORMAL HIGH (ref 65–99)

## 2017-11-17 SURGERY — A/V SHUNTOGRAM
Anesthesia: LOCAL | Laterality: Right

## 2017-11-17 MED ORDER — SODIUM CHLORIDE 0.9% FLUSH: 3.0000 mL | Freq: Two times a day (BID) | INTRAVENOUS | Status: DC

## 2017-11-17 MED ORDER — HEPARIN (PORCINE) IN NACL 2-0.9 UNIT/ML-% IJ SOLN
INTRAMUSCULAR | Status: AC
  Filled 2017-11-17: qty 500

## 2017-11-17 MED ORDER — HEPARIN (PORCINE) IN NACL 2-0.9 UNIT/ML-% IJ SOLN
INTRAMUSCULAR | Status: AC | PRN
  Administered 2017-11-17: 500 mL

## 2017-11-17 MED ORDER — DIPHENHYDRAMINE HCL 50 MG/ML IJ SOLN
INTRAMUSCULAR | Status: AC
  Administered 2017-11-17: 25 mg via INTRAVENOUS
  Filled 2017-11-17: qty 1

## 2017-11-17 MED ORDER — LIDOCAINE HCL (PF) 1 % IJ SOLN
INTRAMUSCULAR | Status: AC
  Filled 2017-11-17: qty 30

## 2017-11-17 MED ORDER — SODIUM CHLORIDE 0.9% FLUSH: 3.0000 mL | INTRAVENOUS | Status: DC | PRN

## 2017-11-17 MED ORDER — SODIUM CHLORIDE 0.9 % IV SOLN: 250.0000 mL | INTRAVENOUS | Status: DC | PRN

## 2017-11-17 MED ORDER — IODIXANOL 320 MG/ML IV SOLN
INTRAVENOUS | Status: DC | PRN
  Administered 2017-11-17: 30 mL via INTRAVENOUS

## 2017-11-17 MED ORDER — DIPHENHYDRAMINE HCL 50 MG/ML IJ SOLN
25.0000 mg | Freq: Once | INTRAMUSCULAR | Status: AC
  Administered 2017-11-17: 25 mg via INTRAVENOUS

## 2017-11-17 MED ORDER — LIDOCAINE HCL (PF) 1 % IJ SOLN
INTRAMUSCULAR | Status: DC | PRN
  Administered 2017-11-17: 5 mL

## 2017-11-17 MED ORDER — METHYLPREDNISOLONE SODIUM SUCC 125 MG IJ SOLR
INTRAMUSCULAR | Status: AC
Start: 2017-11-17 — End: 2017-11-17
  Administered 2017-11-17: 125 mg via INTRAVENOUS
  Filled 2017-11-17: qty 2

## 2017-11-17 MED ORDER — METHYLPREDNISOLONE SODIUM SUCC 125 MG IJ SOLR
125.0000 mg | Freq: Once | INTRAMUSCULAR | Status: AC
  Administered 2017-11-17: 125 mg via INTRAVENOUS

## 2017-11-17 SURGICAL SUPPLY — 11 items
BAG SNAP BAND KOVER 36X36 (MISCELLANEOUS) ×2 IMPLANT
COVER DOME SNAP 22 D (MISCELLANEOUS) ×2 IMPLANT
COVER PRB 48X5XTLSCP FOLD TPE (BAG) ×1 IMPLANT
COVER PROBE 5X48 (BAG) ×2
KIT MICROPUNCTURE NIT STIFF (SHEATH) ×1 IMPLANT
PROTECTION STATION PRESSURIZED (MISCELLANEOUS) ×2
STATION PROTECTION PRESSURIZED (MISCELLANEOUS) ×1 IMPLANT
STOPCOCK MORSE 400PSI 3WAY (MISCELLANEOUS) ×2 IMPLANT
TRAY PV CATH (CUSTOM PROCEDURE TRAY) ×2 IMPLANT
TUBING CIL FLEX 10 FLL-RA (TUBING) ×2 IMPLANT
WIRE TORQFLEX AUST .018X40CM (WIRE) ×2 IMPLANT

## 2017-11-17 NOTE — Op Note (Addendum)
    Patient name: Jacob [name] MRN: [id number] DOB: 06-01-1951 Sex: male  11/17/2017 Pre-operative Diagnosis: esrd, malfunction of right arm av graft Post-operative diagnosis:  Same Surgeon:  Eda Paschal. Donzetta Matters, MD Procedure Performed:  Right arm shuntogram  Indications:  67yo male with history of esrd and has a hero graft in place in his right upper extremity.  He recently had a bleeding event and had a stent placed.  He also had his wound sutured at the time.  He is now indicated for shuntogram and removal of the suture.  Findings: The endograft is patent throughout its course.  There is a stent placed in the midportion of this graft.  If he has further issues with this he will need consideration of removing the stented portion of the graft and placing a new quick stick graft which will unfortunately mean he will need to dialyze via catheter which will need to be placed in his groin.   Procedure:  The patient was identified in the holding area and taken to room 8.  The patient was then placed supine on the table and prepped and draped in the usual sterile fashion.  A time out was called.  Ultrasound was used to evaluate the right upper arm AV graft and images were recorded. It was noted to be patent and this was cannulated with micropuncture needle with direct ultrasound visualization of needle entry.  I was unfortunately having difficulty passing a wire and had to use multiple wires.  I then exchanged for a micropuncture sheath and performed right upper extremity angiogram.  Pressure was held and we performed retrograde which demonstrated patent arterial anastomosis.  He does have a stent in the midportion of his graft which does complicate cannulation.  He has further thrombosis of this graft he will need consideration of open thrombectomy with removal of the stent and replacement of interposition graft that can be quick stick given that this graft is also quick stick.  In the interim he would need  catheter for dialysis and he does not have any left upper extremity options.  He tolerated this procedure without immediate comp occasions.  Contrast: 30cc    Laquandra Carrillo C. Donzetta Matters, MD [facility] [place] Office: [phone] Pager: [phone]

## 2017-11-17 NOTE — Discharge Instructions (Signed)
Shuntogram, Care After °Refer to this sheet in the next few weeks. These instructions provide you with information on caring for yourself after your procedure. Your health care provider may also give you more specific instructions. Your treatment has been planned according to current medical practices, but problems sometimes occur. Call your health care provider if you have any problems or questions after your procedure. °What can I expect after the procedure? °After your procedure, it is typical to have the following: °· A small amount of discomfort in the area where the catheters were placed. °· A small amount of bruising around the fistula. °· Sleepiness and fatigue. ° °Follow these instructions at home: °· Rest at home for the day following your procedure. °· Do not drive or operate heavy machinery while taking pain medicine. °· Take medicines only as directed by your health care provider. °· Do not take baths, swim, or use a hot tub until your health care provider approves. You may shower 24 hours after the procedure or as directed by your health care provider. °· There are many different ways to close and cover an incision, including stitches, skin glue, and adhesive strips. Follow your health care provider's instructions on: °? Incision care. °? Bandage (dressing) changes and removal. °? Incision closure removal. °· Monitor your dialysis fistula carefully. °Contact a health care provider if: °· You have drainage, redness, swelling, or pain at your catheter site. °· You have a fever. °· You have chills. °Get help right away if: °· You feel weak. °· You have trouble balancing. °· You have trouble moving your arms or legs. °· You have problems with your speech or vision. °· You can no longer feel a vibration or buzz when you put your fingers over your dialysis fistula. °· The limb that was used for the procedure: °? Swells. °? Is painful. °? Is cold. °? Is discolored, such as blue or pale white. °This  information is not intended to replace advice given to you by your health care provider. Make sure you discuss any questions you have with your health care provider. °Document Released: 12/04/2013 Document Revised: 12/26/2015 Document Reviewed: 09/08/2013 °Elsevier Interactive Patient Education © 2018 [employer] ° °

## 2017-11-17 NOTE — H&P (Signed)
   History and Physical Update  The patient was interviewed and re-examined.  The patient's previous History and Physical has been reviewed and is unchanged from recent office visit. Plan for right arm shuntogram.  Brandon C. Donzetta Matters, MD [facility] [place] Office: [phone] Pager: [phone]   11/17/2017, 7:26 AM

## 2017-11-19 DIAGNOSIS — N2581 Secondary hyperparathyroidism of renal origin: Secondary | ICD-10-CM | POA: Diagnosis not present

## 2017-11-19 DIAGNOSIS — D509 Iron deficiency anemia, unspecified: Secondary | ICD-10-CM | POA: Diagnosis not present

## 2017-11-19 DIAGNOSIS — Z992 Dependence on renal dialysis: Secondary | ICD-10-CM | POA: Diagnosis not present

## 2017-11-19 DIAGNOSIS — D631 Anemia in chronic kidney disease: Secondary | ICD-10-CM | POA: Diagnosis not present

## 2017-11-19 DIAGNOSIS — N186 End stage renal disease: Secondary | ICD-10-CM | POA: Diagnosis not present

## 2017-11-22 DIAGNOSIS — Z992 Dependence on renal dialysis: Secondary | ICD-10-CM | POA: Diagnosis not present

## 2017-11-22 DIAGNOSIS — N186 End stage renal disease: Secondary | ICD-10-CM | POA: Diagnosis not present

## 2017-11-22 DIAGNOSIS — N2581 Secondary hyperparathyroidism of renal origin: Secondary | ICD-10-CM | POA: Diagnosis not present

## 2017-11-22 DIAGNOSIS — D509 Iron deficiency anemia, unspecified: Secondary | ICD-10-CM | POA: Diagnosis not present

## 2017-11-22 DIAGNOSIS — D631 Anemia in chronic kidney disease: Secondary | ICD-10-CM | POA: Diagnosis not present

## 2017-11-23 ENCOUNTER — Ambulatory Visit: Payer: Medicare Other | Admitting: Family

## 2017-11-24 DIAGNOSIS — D509 Iron deficiency anemia, unspecified: Secondary | ICD-10-CM | POA: Diagnosis not present

## 2017-11-24 DIAGNOSIS — D631 Anemia in chronic kidney disease: Secondary | ICD-10-CM | POA: Diagnosis not present

## 2017-11-24 DIAGNOSIS — N2581 Secondary hyperparathyroidism of renal origin: Secondary | ICD-10-CM | POA: Diagnosis not present

## 2017-11-24 DIAGNOSIS — N186 End stage renal disease: Secondary | ICD-10-CM | POA: Diagnosis not present

## 2017-11-24 DIAGNOSIS — Z992 Dependence on renal dialysis: Secondary | ICD-10-CM | POA: Diagnosis not present

## 2017-11-26 DIAGNOSIS — D631 Anemia in chronic kidney disease: Secondary | ICD-10-CM | POA: Diagnosis not present

## 2017-11-26 DIAGNOSIS — N2581 Secondary hyperparathyroidism of renal origin: Secondary | ICD-10-CM | POA: Diagnosis not present

## 2017-11-26 DIAGNOSIS — Z992 Dependence on renal dialysis: Secondary | ICD-10-CM | POA: Diagnosis not present

## 2017-11-26 DIAGNOSIS — D509 Iron deficiency anemia, unspecified: Secondary | ICD-10-CM | POA: Diagnosis not present

## 2017-11-26 DIAGNOSIS — N186 End stage renal disease: Secondary | ICD-10-CM | POA: Diagnosis not present

## 2017-11-29 DIAGNOSIS — D509 Iron deficiency anemia, unspecified: Secondary | ICD-10-CM | POA: Diagnosis not present

## 2017-11-29 DIAGNOSIS — N2581 Secondary hyperparathyroidism of renal origin: Secondary | ICD-10-CM | POA: Diagnosis not present

## 2017-11-29 DIAGNOSIS — D631 Anemia in chronic kidney disease: Secondary | ICD-10-CM | POA: Diagnosis not present

## 2017-11-29 DIAGNOSIS — N186 End stage renal disease: Secondary | ICD-10-CM | POA: Diagnosis not present

## 2017-11-29 DIAGNOSIS — Z992 Dependence on renal dialysis: Secondary | ICD-10-CM | POA: Diagnosis not present

## 2017-11-30 DIAGNOSIS — N186 End stage renal disease: Secondary | ICD-10-CM | POA: Diagnosis not present

## 2017-11-30 DIAGNOSIS — Z992 Dependence on renal dialysis: Secondary | ICD-10-CM | POA: Diagnosis not present

## 2017-12-01 DIAGNOSIS — N186 End stage renal disease: Secondary | ICD-10-CM | POA: Diagnosis not present

## 2017-12-01 DIAGNOSIS — N2581 Secondary hyperparathyroidism of renal origin: Secondary | ICD-10-CM | POA: Diagnosis not present

## 2017-12-01 DIAGNOSIS — Z992 Dependence on renal dialysis: Secondary | ICD-10-CM | POA: Diagnosis not present

## 2017-12-01 DIAGNOSIS — D631 Anemia in chronic kidney disease: Secondary | ICD-10-CM | POA: Diagnosis not present

## 2017-12-01 DIAGNOSIS — D509 Iron deficiency anemia, unspecified: Secondary | ICD-10-CM | POA: Diagnosis not present

## 2017-12-03 DIAGNOSIS — Z992 Dependence on renal dialysis: Secondary | ICD-10-CM | POA: Diagnosis not present

## 2017-12-03 DIAGNOSIS — N2581 Secondary hyperparathyroidism of renal origin: Secondary | ICD-10-CM | POA: Diagnosis not present

## 2017-12-03 DIAGNOSIS — D631 Anemia in chronic kidney disease: Secondary | ICD-10-CM | POA: Diagnosis not present

## 2017-12-03 DIAGNOSIS — D509 Iron deficiency anemia, unspecified: Secondary | ICD-10-CM | POA: Diagnosis not present

## 2017-12-03 DIAGNOSIS — N186 End stage renal disease: Secondary | ICD-10-CM | POA: Diagnosis not present

## 2017-12-06 DIAGNOSIS — N186 End stage renal disease: Secondary | ICD-10-CM | POA: Diagnosis not present

## 2017-12-06 DIAGNOSIS — Z992 Dependence on renal dialysis: Secondary | ICD-10-CM | POA: Diagnosis not present

## 2017-12-06 DIAGNOSIS — D631 Anemia in chronic kidney disease: Secondary | ICD-10-CM | POA: Diagnosis not present

## 2017-12-06 DIAGNOSIS — N2581 Secondary hyperparathyroidism of renal origin: Secondary | ICD-10-CM | POA: Diagnosis not present

## 2017-12-06 DIAGNOSIS — D509 Iron deficiency anemia, unspecified: Secondary | ICD-10-CM | POA: Diagnosis not present

## 2017-12-08 DIAGNOSIS — N186 End stage renal disease: Secondary | ICD-10-CM | POA: Diagnosis not present

## 2017-12-08 DIAGNOSIS — N2581 Secondary hyperparathyroidism of renal origin: Secondary | ICD-10-CM | POA: Diagnosis not present

## 2017-12-08 DIAGNOSIS — Z992 Dependence on renal dialysis: Secondary | ICD-10-CM | POA: Diagnosis not present

## 2017-12-08 DIAGNOSIS — D631 Anemia in chronic kidney disease: Secondary | ICD-10-CM | POA: Diagnosis not present

## 2017-12-08 DIAGNOSIS — D509 Iron deficiency anemia, unspecified: Secondary | ICD-10-CM | POA: Diagnosis not present

## 2017-12-10 DIAGNOSIS — D631 Anemia in chronic kidney disease: Secondary | ICD-10-CM | POA: Diagnosis not present

## 2017-12-10 DIAGNOSIS — Z992 Dependence on renal dialysis: Secondary | ICD-10-CM | POA: Diagnosis not present

## 2017-12-10 DIAGNOSIS — D509 Iron deficiency anemia, unspecified: Secondary | ICD-10-CM | POA: Diagnosis not present

## 2017-12-10 DIAGNOSIS — N2581 Secondary hyperparathyroidism of renal origin: Secondary | ICD-10-CM | POA: Diagnosis not present

## 2017-12-10 DIAGNOSIS — N186 End stage renal disease: Secondary | ICD-10-CM | POA: Diagnosis not present

## 2017-12-13 DIAGNOSIS — Z992 Dependence on renal dialysis: Secondary | ICD-10-CM | POA: Diagnosis not present

## 2017-12-13 DIAGNOSIS — N2581 Secondary hyperparathyroidism of renal origin: Secondary | ICD-10-CM | POA: Diagnosis not present

## 2017-12-13 DIAGNOSIS — D631 Anemia in chronic kidney disease: Secondary | ICD-10-CM | POA: Diagnosis not present

## 2017-12-13 DIAGNOSIS — N186 End stage renal disease: Secondary | ICD-10-CM | POA: Diagnosis not present

## 2017-12-13 DIAGNOSIS — D509 Iron deficiency anemia, unspecified: Secondary | ICD-10-CM | POA: Diagnosis not present

## 2017-12-15 DIAGNOSIS — N2581 Secondary hyperparathyroidism of renal origin: Secondary | ICD-10-CM | POA: Diagnosis not present

## 2017-12-15 DIAGNOSIS — D509 Iron deficiency anemia, unspecified: Secondary | ICD-10-CM | POA: Diagnosis not present

## 2017-12-15 DIAGNOSIS — N186 End stage renal disease: Secondary | ICD-10-CM | POA: Diagnosis not present

## 2017-12-15 DIAGNOSIS — Z992 Dependence on renal dialysis: Secondary | ICD-10-CM | POA: Diagnosis not present

## 2017-12-15 DIAGNOSIS — D631 Anemia in chronic kidney disease: Secondary | ICD-10-CM | POA: Diagnosis not present

## 2017-12-17 DIAGNOSIS — N2581 Secondary hyperparathyroidism of renal origin: Secondary | ICD-10-CM | POA: Diagnosis not present

## 2017-12-17 DIAGNOSIS — N186 End stage renal disease: Secondary | ICD-10-CM | POA: Diagnosis not present

## 2017-12-17 DIAGNOSIS — D631 Anemia in chronic kidney disease: Secondary | ICD-10-CM | POA: Diagnosis not present

## 2017-12-17 DIAGNOSIS — Z992 Dependence on renal dialysis: Secondary | ICD-10-CM | POA: Diagnosis not present

## 2017-12-17 DIAGNOSIS — D509 Iron deficiency anemia, unspecified: Secondary | ICD-10-CM | POA: Diagnosis not present

## 2017-12-20 DIAGNOSIS — N186 End stage renal disease: Secondary | ICD-10-CM | POA: Diagnosis not present

## 2017-12-20 DIAGNOSIS — N2581 Secondary hyperparathyroidism of renal origin: Secondary | ICD-10-CM | POA: Diagnosis not present

## 2017-12-20 DIAGNOSIS — D631 Anemia in chronic kidney disease: Secondary | ICD-10-CM | POA: Diagnosis not present

## 2017-12-20 DIAGNOSIS — Z992 Dependence on renal dialysis: Secondary | ICD-10-CM | POA: Diagnosis not present

## 2017-12-20 DIAGNOSIS — D509 Iron deficiency anemia, unspecified: Secondary | ICD-10-CM | POA: Diagnosis not present

## 2017-12-22 DIAGNOSIS — N186 End stage renal disease: Secondary | ICD-10-CM | POA: Diagnosis not present

## 2017-12-22 DIAGNOSIS — Z992 Dependence on renal dialysis: Secondary | ICD-10-CM | POA: Diagnosis not present

## 2017-12-22 DIAGNOSIS — N2581 Secondary hyperparathyroidism of renal origin: Secondary | ICD-10-CM | POA: Diagnosis not present

## 2017-12-22 DIAGNOSIS — D631 Anemia in chronic kidney disease: Secondary | ICD-10-CM | POA: Diagnosis not present

## 2017-12-22 DIAGNOSIS — D509 Iron deficiency anemia, unspecified: Secondary | ICD-10-CM | POA: Diagnosis not present

## 2017-12-24 DIAGNOSIS — Z992 Dependence on renal dialysis: Secondary | ICD-10-CM | POA: Diagnosis not present

## 2017-12-24 DIAGNOSIS — N2581 Secondary hyperparathyroidism of renal origin: Secondary | ICD-10-CM | POA: Diagnosis not present

## 2017-12-24 DIAGNOSIS — D509 Iron deficiency anemia, unspecified: Secondary | ICD-10-CM | POA: Diagnosis not present

## 2017-12-24 DIAGNOSIS — D631 Anemia in chronic kidney disease: Secondary | ICD-10-CM | POA: Diagnosis not present

## 2017-12-24 DIAGNOSIS — N186 End stage renal disease: Secondary | ICD-10-CM | POA: Diagnosis not present

## 2017-12-27 DIAGNOSIS — D631 Anemia in chronic kidney disease: Secondary | ICD-10-CM | POA: Diagnosis not present

## 2017-12-27 DIAGNOSIS — Z992 Dependence on renal dialysis: Secondary | ICD-10-CM | POA: Diagnosis not present

## 2017-12-27 DIAGNOSIS — D509 Iron deficiency anemia, unspecified: Secondary | ICD-10-CM | POA: Diagnosis not present

## 2017-12-27 DIAGNOSIS — N186 End stage renal disease: Secondary | ICD-10-CM | POA: Diagnosis not present

## 2017-12-27 DIAGNOSIS — N2581 Secondary hyperparathyroidism of renal origin: Secondary | ICD-10-CM | POA: Diagnosis not present

## 2017-12-29 DIAGNOSIS — N186 End stage renal disease: Secondary | ICD-10-CM | POA: Diagnosis not present

## 2017-12-29 DIAGNOSIS — D631 Anemia in chronic kidney disease: Secondary | ICD-10-CM | POA: Diagnosis not present

## 2017-12-29 DIAGNOSIS — Z992 Dependence on renal dialysis: Secondary | ICD-10-CM | POA: Diagnosis not present

## 2017-12-29 DIAGNOSIS — D509 Iron deficiency anemia, unspecified: Secondary | ICD-10-CM | POA: Diagnosis not present

## 2017-12-29 DIAGNOSIS — N2581 Secondary hyperparathyroidism of renal origin: Secondary | ICD-10-CM | POA: Diagnosis not present

## 2017-12-31 DIAGNOSIS — D509 Iron deficiency anemia, unspecified: Secondary | ICD-10-CM | POA: Diagnosis not present

## 2017-12-31 DIAGNOSIS — D631 Anemia in chronic kidney disease: Secondary | ICD-10-CM | POA: Diagnosis not present

## 2017-12-31 DIAGNOSIS — N2581 Secondary hyperparathyroidism of renal origin: Secondary | ICD-10-CM | POA: Diagnosis not present

## 2017-12-31 DIAGNOSIS — Z992 Dependence on renal dialysis: Secondary | ICD-10-CM | POA: Diagnosis not present

## 2017-12-31 DIAGNOSIS — N186 End stage renal disease: Secondary | ICD-10-CM | POA: Diagnosis not present

## 2018-01-03 DIAGNOSIS — N2581 Secondary hyperparathyroidism of renal origin: Secondary | ICD-10-CM | POA: Diagnosis not present

## 2018-01-03 DIAGNOSIS — N186 End stage renal disease: Secondary | ICD-10-CM | POA: Diagnosis not present

## 2018-01-03 DIAGNOSIS — Z992 Dependence on renal dialysis: Secondary | ICD-10-CM | POA: Diagnosis not present

## 2018-01-03 DIAGNOSIS — D509 Iron deficiency anemia, unspecified: Secondary | ICD-10-CM | POA: Diagnosis not present

## 2018-01-04 DIAGNOSIS — Z6834 Body mass index (BMI) 34.0-34.9, adult: Secondary | ICD-10-CM | POA: Diagnosis not present

## 2018-01-04 DIAGNOSIS — E1021 Type 1 diabetes mellitus with diabetic nephropathy: Secondary | ICD-10-CM | POA: Diagnosis not present

## 2018-01-04 DIAGNOSIS — Z1389 Encounter for screening for other disorder: Secondary | ICD-10-CM | POA: Diagnosis not present

## 2018-01-04 DIAGNOSIS — I509 Heart failure, unspecified: Secondary | ICD-10-CM | POA: Diagnosis not present

## 2018-01-04 DIAGNOSIS — N186 End stage renal disease: Secondary | ICD-10-CM | POA: Diagnosis not present

## 2018-01-05 DIAGNOSIS — D509 Iron deficiency anemia, unspecified: Secondary | ICD-10-CM | POA: Diagnosis not present

## 2018-01-05 DIAGNOSIS — N186 End stage renal disease: Secondary | ICD-10-CM | POA: Diagnosis not present

## 2018-01-05 DIAGNOSIS — Z992 Dependence on renal dialysis: Secondary | ICD-10-CM | POA: Diagnosis not present

## 2018-01-05 DIAGNOSIS — N2581 Secondary hyperparathyroidism of renal origin: Secondary | ICD-10-CM | POA: Diagnosis not present

## 2018-01-07 DIAGNOSIS — Z992 Dependence on renal dialysis: Secondary | ICD-10-CM | POA: Diagnosis not present

## 2018-01-07 DIAGNOSIS — D509 Iron deficiency anemia, unspecified: Secondary | ICD-10-CM | POA: Diagnosis not present

## 2018-01-07 DIAGNOSIS — N2581 Secondary hyperparathyroidism of renal origin: Secondary | ICD-10-CM | POA: Diagnosis not present

## 2018-01-07 DIAGNOSIS — N186 End stage renal disease: Secondary | ICD-10-CM | POA: Diagnosis not present

## 2018-01-10 DIAGNOSIS — D509 Iron deficiency anemia, unspecified: Secondary | ICD-10-CM | POA: Diagnosis not present

## 2018-01-10 DIAGNOSIS — N186 End stage renal disease: Secondary | ICD-10-CM | POA: Diagnosis not present

## 2018-01-10 DIAGNOSIS — N2581 Secondary hyperparathyroidism of renal origin: Secondary | ICD-10-CM | POA: Diagnosis not present

## 2018-01-10 DIAGNOSIS — Z992 Dependence on renal dialysis: Secondary | ICD-10-CM | POA: Diagnosis not present

## 2018-01-12 DIAGNOSIS — N186 End stage renal disease: Secondary | ICD-10-CM | POA: Diagnosis not present

## 2018-01-12 DIAGNOSIS — Z992 Dependence on renal dialysis: Secondary | ICD-10-CM | POA: Diagnosis not present

## 2018-01-12 DIAGNOSIS — D509 Iron deficiency anemia, unspecified: Secondary | ICD-10-CM | POA: Diagnosis not present

## 2018-01-12 DIAGNOSIS — N2581 Secondary hyperparathyroidism of renal origin: Secondary | ICD-10-CM | POA: Diagnosis not present

## 2018-01-14 DIAGNOSIS — D509 Iron deficiency anemia, unspecified: Secondary | ICD-10-CM | POA: Diagnosis not present

## 2018-01-14 DIAGNOSIS — Z992 Dependence on renal dialysis: Secondary | ICD-10-CM | POA: Diagnosis not present

## 2018-01-14 DIAGNOSIS — N2581 Secondary hyperparathyroidism of renal origin: Secondary | ICD-10-CM | POA: Diagnosis not present

## 2018-01-14 DIAGNOSIS — N186 End stage renal disease: Secondary | ICD-10-CM | POA: Diagnosis not present

## 2018-01-17 DIAGNOSIS — N186 End stage renal disease: Secondary | ICD-10-CM | POA: Diagnosis not present

## 2018-01-17 DIAGNOSIS — D509 Iron deficiency anemia, unspecified: Secondary | ICD-10-CM | POA: Diagnosis not present

## 2018-01-17 DIAGNOSIS — Z992 Dependence on renal dialysis: Secondary | ICD-10-CM | POA: Diagnosis not present

## 2018-01-17 DIAGNOSIS — N2581 Secondary hyperparathyroidism of renal origin: Secondary | ICD-10-CM | POA: Diagnosis not present

## 2018-01-19 ENCOUNTER — Other Ambulatory Visit (HOSPITAL_COMMUNITY): Payer: Self-pay | Admitting: Nephrology

## 2018-01-19 ENCOUNTER — Encounter (HOSPITAL_COMMUNITY): Payer: Self-pay | Admitting: *Deleted

## 2018-01-19 ENCOUNTER — Other Ambulatory Visit: Payer: Self-pay

## 2018-01-19 ENCOUNTER — Other Ambulatory Visit: Payer: Self-pay | Admitting: Radiology

## 2018-01-19 ENCOUNTER — Other Ambulatory Visit: Payer: Self-pay | Admitting: *Deleted

## 2018-01-19 DIAGNOSIS — N186 End stage renal disease: Secondary | ICD-10-CM

## 2018-01-19 DIAGNOSIS — Z992 Dependence on renal dialysis: Principal | ICD-10-CM

## 2018-01-19 NOTE — Progress Notes (Signed)
Spoke with pt for pre-op call. Pt denies cardiac history, chest pain or sob. Pt is a type 2 diabetic. He states his last A1C was 6.0, he state the Dialysis center checks it every month. Pt states his fasting blood sugar is usually around 160. Instructed pt to take 1/2 of his regular dose of Lantus Insulin this evening (will take 7 units). Instructed pt to check his blood sugar when he gets up in the AM and every 2 hours until he leaves for the hospital. If blood sugar is 70 or below, treat with 1/2 cup of clear juice (apple or cranberry) and recheck blood sugar 15 minutes after drinking juice. If blood sugar continues to be 70 or below, call the Short Stay department and ask to speak to a nurse. Pt voiced understanding.

## 2018-01-20 ENCOUNTER — Ambulatory Visit (HOSPITAL_COMMUNITY)
Admission: RE | Admit: 2018-01-20 | Discharge: 2018-01-20 | Disposition: A | Discharge status: Home / Self Care | Payer: Medicare Other | Source: Ambulatory Visit | Attending: Nephrology | Admitting: Nephrology

## 2018-01-20 ENCOUNTER — Encounter (HOSPITAL_COMMUNITY): Payer: Self-pay | Admitting: Urology

## 2018-01-20 ENCOUNTER — Ambulatory Visit (HOSPITAL_COMMUNITY)
Admission: RE | Admit: 2018-01-20 | Discharge: 2018-01-20 | Disposition: A | Discharge status: Home / Self Care | Payer: Medicare Other | Source: Ambulatory Visit | Attending: Vascular Surgery | Admitting: Vascular Surgery

## 2018-01-20 ENCOUNTER — Ambulatory Visit (HOSPITAL_COMMUNITY): Payer: Medicare Other | Admitting: Anesthesiology

## 2018-01-20 ENCOUNTER — Encounter (HOSPITAL_COMMUNITY)
Admission: RE | Disposition: A | Discharge status: Home / Self Care | Payer: Self-pay | Source: Ambulatory Visit | Attending: Vascular Surgery

## 2018-01-20 ENCOUNTER — Encounter (HOSPITAL_COMMUNITY): Payer: Self-pay

## 2018-01-20 DIAGNOSIS — H409 Unspecified glaucoma: Secondary | ICD-10-CM | POA: Insufficient documentation

## 2018-01-20 DIAGNOSIS — K219 Gastro-esophageal reflux disease without esophagitis: Secondary | ICD-10-CM | POA: Insufficient documentation

## 2018-01-20 DIAGNOSIS — Z8673 Personal history of transient ischemic attack (TIA), and cerebral infarction without residual deficits: Secondary | ICD-10-CM | POA: Diagnosis not present

## 2018-01-20 DIAGNOSIS — D631 Anemia in chronic kidney disease: Secondary | ICD-10-CM | POA: Insufficient documentation

## 2018-01-20 DIAGNOSIS — Z820 Family history of epilepsy and other diseases of the nervous system: Secondary | ICD-10-CM | POA: Diagnosis not present

## 2018-01-20 DIAGNOSIS — Z992 Dependence on renal dialysis: Secondary | ICD-10-CM | POA: Insufficient documentation

## 2018-01-20 DIAGNOSIS — F329 Major depressive disorder, single episode, unspecified: Secondary | ICD-10-CM | POA: Diagnosis not present

## 2018-01-20 DIAGNOSIS — Z91041 Radiographic dye allergy status: Secondary | ICD-10-CM | POA: Insufficient documentation

## 2018-01-20 DIAGNOSIS — E1122 Type 2 diabetes mellitus with diabetic chronic kidney disease: Secondary | ICD-10-CM | POA: Diagnosis not present

## 2018-01-20 DIAGNOSIS — M1990 Unspecified osteoarthritis, unspecified site: Secondary | ICD-10-CM | POA: Diagnosis not present

## 2018-01-20 DIAGNOSIS — Z91030 Bee allergy status: Secondary | ICD-10-CM | POA: Insufficient documentation

## 2018-01-20 DIAGNOSIS — X58XXXA Exposure to other specified factors, initial encounter: Secondary | ICD-10-CM | POA: Diagnosis not present

## 2018-01-20 DIAGNOSIS — Z833 Family history of diabetes mellitus: Secondary | ICD-10-CM | POA: Insufficient documentation

## 2018-01-20 DIAGNOSIS — I120 Hypertensive chronic kidney disease with stage 5 chronic kidney disease or end stage renal disease: Secondary | ICD-10-CM | POA: Diagnosis not present

## 2018-01-20 DIAGNOSIS — N186 End stage renal disease: Secondary | ICD-10-CM | POA: Insufficient documentation

## 2018-01-20 DIAGNOSIS — Z882 Allergy status to sulfonamides status: Secondary | ICD-10-CM | POA: Diagnosis not present

## 2018-01-20 DIAGNOSIS — Z7984 Long term (current) use of oral hypoglycemic drugs: Secondary | ICD-10-CM | POA: Diagnosis not present

## 2018-01-20 DIAGNOSIS — Z880 Allergy status to penicillin: Secondary | ICD-10-CM | POA: Insufficient documentation

## 2018-01-20 DIAGNOSIS — G4700 Insomnia, unspecified: Secondary | ICD-10-CM | POA: Insufficient documentation

## 2018-01-20 DIAGNOSIS — Z888 Allergy status to other drugs, medicaments and biological substances status: Secondary | ICD-10-CM | POA: Insufficient documentation

## 2018-01-20 DIAGNOSIS — F419 Anxiety disorder, unspecified: Secondary | ICD-10-CM | POA: Diagnosis not present

## 2018-01-20 DIAGNOSIS — T82868A Thrombosis of vascular prosthetic devices, implants and grafts, initial encounter: Secondary | ICD-10-CM | POA: Diagnosis not present

## 2018-01-20 DIAGNOSIS — Z8249 Family history of ischemic heart disease and other diseases of the circulatory system: Secondary | ICD-10-CM | POA: Insufficient documentation

## 2018-01-20 DIAGNOSIS — I428 Other cardiomyopathies: Secondary | ICD-10-CM | POA: Diagnosis not present

## 2018-01-20 HISTORY — PX: THROMBECTOMY AND REVISION OF ARTERIOVENTOUS (AV) GORETEX  GRAFT: SHX6120

## 2018-01-20 LAB — POCT I-STAT 4, (NA,K, GLUC, HGB,HCT)
GLUCOSE: 125 mg/dL — AB (ref 65–99)
HEMATOCRIT: 44 % (ref 39.0–52.0)
Hemoglobin: 15 g/dL (ref 13.0–17.0)
POTASSIUM: 5 mmol/L (ref 3.5–5.1)
Sodium: 135 mmol/L (ref 135–145)

## 2018-01-20 LAB — HEMOGLOBIN A1C
HEMOGLOBIN A1C: 7.9 % — AB (ref 4.8–5.6)
Mean Plasma Glucose: 180.03 mg/dL

## 2018-01-20 LAB — SURGICAL PCR SCREEN
MRSA, PCR: NEGATIVE
Staphylococcus aureus: NEGATIVE

## 2018-01-20 LAB — GLUCOSE, CAPILLARY
GLUCOSE-CAPILLARY: 136 mg/dL — AB (ref 65–99)
Glucose-Capillary: 116 mg/dL — ABNORMAL HIGH (ref 65–99)

## 2018-01-20 SURGERY — THROMBECTOMY AND REVISION OF ARTERIOVENTOUS (AV) GORETEX  GRAFT
Anesthesia: Monitor Anesthesia Care | Site: Arm Upper | Laterality: Right

## 2018-01-20 MED ORDER — HEPARIN SODIUM (PORCINE) 1000 UNIT/ML IJ SOLN
INTRAMUSCULAR | Status: AC
  Filled 2018-01-20: qty 1

## 2018-01-20 MED ORDER — VANCOMYCIN HCL IN DEXTROSE 1-5 GM/200ML-% IV SOLN
1000.0000 mg | INTRAVENOUS | Status: AC
  Administered 2018-01-20: 1000 mg via INTRAVENOUS

## 2018-01-20 MED ORDER — SUCCINYLCHOLINE CHLORIDE 20 MG/ML IJ SOLN
INTRAMUSCULAR | Status: AC
  Filled 2018-01-20: qty 1

## 2018-01-20 MED ORDER — CARVEDILOL 12.5 MG PO TABS
12.5000 mg | ORAL_TABLET | ORAL | Status: AC
Start: 2018-01-20 — End: 2018-01-20
  Administered 2018-01-20: 12.5 mg via ORAL
  Filled 2018-01-20: qty 1

## 2018-01-20 MED ORDER — SODIUM CHLORIDE 0.9 % IV SOLN
INTRAVENOUS | Status: DC
  Administered 2018-01-20 (×2): via INTRAVENOUS

## 2018-01-20 MED ORDER — PHENYLEPHRINE HCL 10 MG/ML IJ SOLN
INTRAVENOUS | Status: DC | PRN
  Administered 2018-01-20: 50 ug/min via INTRAVENOUS

## 2018-01-20 MED ORDER — PROPOFOL 10 MG/ML IV BOLUS
INTRAVENOUS | Status: AC
  Filled 2018-01-20: qty 20

## 2018-01-20 MED ORDER — EPHEDRINE SULFATE 50 MG/ML IJ SOLN
INTRAMUSCULAR | Status: AC
  Filled 2018-01-20: qty 2

## 2018-01-20 MED ORDER — HYDROMORPHONE HCL 1 MG/ML IJ SOLN: 0.2500 mg | INTRAMUSCULAR | Status: DC | PRN

## 2018-01-20 MED ORDER — LIDOCAINE 2% (20 MG/ML) 5 ML SYRINGE
INTRAMUSCULAR | Status: AC
  Filled 2018-01-20: qty 5

## 2018-01-20 MED ORDER — SODIUM CHLORIDE 0.9 % IJ SOLN
INTRAMUSCULAR | Status: AC
  Filled 2018-01-20: qty 10

## 2018-01-20 MED ORDER — LIDOCAINE-EPINEPHRINE (PF) 1 %-1:200000 IJ SOLN
INTRAMUSCULAR | Status: DC | PRN
  Administered 2018-01-20: 30 mL

## 2018-01-20 MED ORDER — LIDOCAINE 2% (20 MG/ML) 5 ML SYRINGE
INTRAMUSCULAR | Status: AC
  Filled 2018-01-20: qty 20

## 2018-01-20 MED ORDER — HEPARIN SODIUM (PORCINE) 1000 UNIT/ML IJ SOLN
INTRAMUSCULAR | Status: DC | PRN
  Administered 2018-01-20: 10000 [IU] via INTRAVENOUS

## 2018-01-20 MED ORDER — LIDOCAINE HCL (CARDIAC) PF 100 MG/5ML IV SOSY
PREFILLED_SYRINGE | INTRAVENOUS | Status: DC | PRN
  Administered 2018-01-20: 60 mg via INTRAVENOUS
  Administered 2018-01-20: 40 mg via INTRAVENOUS

## 2018-01-20 MED ORDER — EPHEDRINE SULFATE 50 MG/ML IJ SOLN
INTRAMUSCULAR | Status: AC
Start: 2018-01-20 — End: ?
  Filled 2018-01-20: qty 1

## 2018-01-20 MED ORDER — HEPARIN SODIUM (PORCINE) 5000 UNIT/ML IJ SOLN
INTRAMUSCULAR | Status: AC
  Filled 2018-01-20: qty 1.2

## 2018-01-20 MED ORDER — DEXAMETHASONE SODIUM PHOSPHATE 10 MG/ML IJ SOLN
INTRAMUSCULAR | Status: AC
  Filled 2018-01-20: qty 2

## 2018-01-20 MED ORDER — MIDAZOLAM HCL 2 MG/2ML IJ SOLN
INTRAMUSCULAR | Status: AC
  Filled 2018-01-20: qty 2

## 2018-01-20 MED ORDER — ALBUMIN HUMAN 5 % IV SOLN
INTRAVENOUS | Status: DC | PRN
  Administered 2018-01-20: 12:00:00 via INTRAVENOUS

## 2018-01-20 MED ORDER — LIDOCAINE-EPINEPHRINE (PF) 1 %-1:200000 IJ SOLN
INTRAMUSCULAR | Status: AC
  Filled 2018-01-20: qty 30

## 2018-01-20 MED ORDER — FENTANYL CITRATE (PF) 250 MCG/5ML IJ SOLN
INTRAMUSCULAR | Status: AC
  Filled 2018-01-20: qty 5

## 2018-01-20 MED ORDER — ROCURONIUM BROMIDE 50 MG/5ML IV SOLN
INTRAVENOUS | Status: AC
  Filled 2018-01-20: qty 3

## 2018-01-20 MED ORDER — ONDANSETRON HCL 4 MG/2ML IJ SOLN
INTRAMUSCULAR | Status: AC
  Filled 2018-01-20: qty 6

## 2018-01-20 MED ORDER — CHLORHEXIDINE GLUCONATE 4 % EX LIQD: 60.0000 mL | Freq: Once | CUTANEOUS | Status: DC

## 2018-01-20 MED ORDER — SODIUM CHLORIDE 0.9 % IJ SOLN
INTRAMUSCULAR | Status: AC
  Filled 2018-01-20: qty 20

## 2018-01-20 MED ORDER — MEPERIDINE HCL 50 MG/ML IJ SOLN
6.2500 mg | INTRAMUSCULAR | Status: DC | PRN
Start: 2018-01-20 — End: 2018-01-20

## 2018-01-20 MED ORDER — PROPOFOL 500 MG/50ML IV EMUL
INTRAVENOUS | Status: DC | PRN
  Administered 2018-01-20: 75 ug/kg/min via INTRAVENOUS

## 2018-01-20 MED ORDER — HEMOSTATIC AGENTS (NO CHARGE) OPTIME
TOPICAL | Status: DC | PRN
  Administered 2018-01-20: 1 via TOPICAL

## 2018-01-20 MED ORDER — VANCOMYCIN HCL IN DEXTROSE 1-5 GM/200ML-% IV SOLN
INTRAVENOUS | Status: AC
  Filled 2018-01-20: qty 200

## 2018-01-20 MED ORDER — TRAMADOL HCL 50 MG PO TABS: 50.0000 mg | ORAL_TABLET | Freq: Four times a day (QID) | ORAL | 0 refills | Status: DC | PRN

## 2018-01-20 MED ORDER — 0.9 % SODIUM CHLORIDE (POUR BTL) OPTIME
TOPICAL | Status: DC | PRN
  Administered 2018-01-20: 1000 mL

## 2018-01-20 MED ORDER — ONDANSETRON HCL 4 MG/2ML IJ SOLN
INTRAMUSCULAR | Status: DC | PRN
  Administered 2018-01-20: 4 mg via INTRAVENOUS

## 2018-01-20 MED ORDER — HEPARIN SODIUM (PORCINE) 5000 UNIT/ML IJ SOLN
INTRAMUSCULAR | Status: DC | PRN
  Administered 2018-01-20: 12:00:00

## 2018-01-20 MED ORDER — SUGAMMADEX SODIUM 200 MG/2ML IV SOLN
INTRAVENOUS | Status: AC
  Filled 2018-01-20: qty 2

## 2018-01-20 MED ORDER — ONDANSETRON HCL 4 MG/2ML IJ SOLN: 4.0000 mg | Freq: Once | INTRAMUSCULAR | Status: DC | PRN

## 2018-01-20 MED ORDER — EPHEDRINE SULFATE 50 MG/ML IJ SOLN
INTRAMUSCULAR | Status: DC | PRN
  Administered 2018-01-20 (×5): 10 mg via INTRAVENOUS

## 2018-01-20 MED ORDER — CARVEDILOL 12.5 MG PO TABS
ORAL_TABLET | ORAL | Status: AC
  Administered 2018-01-20: 12.5 mg via ORAL
  Filled 2018-01-20: qty 1

## 2018-01-20 SURGICAL SUPPLY — 36 items
ADH SKN CLS APL DERMABOND .7 (GAUZE/BANDAGES/DRESSINGS) ×1
ARMBAND PINK RESTRICT EXTREMIT (MISCELLANEOUS) ×4 IMPLANT
CANISTER SUCT 3000ML PPV (MISCELLANEOUS) ×3 IMPLANT
CANNULA VESSEL 3MM 2 BLNT TIP (CANNULA) ×2 IMPLANT
CATH EMB 3FR 80CM (CATHETERS) ×2 IMPLANT
CATH EMB 4FR 80CM (CATHETERS) ×5 IMPLANT
CLIP VESOCCLUDE MED 6/CT (CLIP) ×3 IMPLANT
CLIP VESOCCLUDE SM WIDE 6/CT (CLIP) ×3 IMPLANT
DERMABOND ADVANCED (GAUZE/BANDAGES/DRESSINGS) ×2
DERMABOND ADVANCED .7 DNX12 (GAUZE/BANDAGES/DRESSINGS) ×1 IMPLANT
ELECT REM PT RETURN 9FT ADLT (ELECTROSURGICAL) ×3
ELECTRODE REM PT RTRN 9FT ADLT (ELECTROSURGICAL) ×1 IMPLANT
GLOVE BIO SURGEON STRL SZ7.5 (GLOVE) ×3 IMPLANT
GLOVE BIOGEL PI IND STRL 8 (GLOVE) ×1 IMPLANT
GLOVE BIOGEL PI INDICATOR 8 (GLOVE) ×2
GOWN STRL REUS W/ TWL LRG LVL3 (GOWN DISPOSABLE) ×3 IMPLANT
GOWN STRL REUS W/TWL LRG LVL3 (GOWN DISPOSABLE) ×9
HEMOSTAT SNOW SURGICEL 2X4 (HEMOSTASIS) ×2 IMPLANT
KIT BASIN OR (CUSTOM PROCEDURE TRAY) ×3 IMPLANT
KIT TURNOVER KIT B (KITS) ×3 IMPLANT
NS IRRIG 1000ML POUR BTL (IV SOLUTION) ×3 IMPLANT
PACK CV ACCESS (CUSTOM PROCEDURE TRAY) ×3 IMPLANT
PAD ARMBOARD 7.5X6 YLW CONV (MISCELLANEOUS) ×6 IMPLANT
SET COLLECT BLD 21X3/4 12 (NEEDLE) IMPLANT
SPONGE LAP 18X18 X RAY DECT (DISPOSABLE) ×2 IMPLANT
SPONGE SURGIFOAM ABS GEL 100 (HEMOSTASIS) IMPLANT
SUCTION FRAZIER HANDLE 10FR (MISCELLANEOUS) ×2
SUCTION TUBE FRAZIER 10FR DISP (MISCELLANEOUS) ×1 IMPLANT
SUT PROLENE 6 0 BV (SUTURE) ×3 IMPLANT
SUT VIC AB 3-0 SH 27 (SUTURE) ×3
SUT VIC AB 3-0 SH 27X BRD (SUTURE) ×1 IMPLANT
SUT VICRYL 4-0 PS2 18IN ABS (SUTURE) ×3 IMPLANT
SYRINGE 3CC LL L/F (MISCELLANEOUS) ×2 IMPLANT
TOWEL GREEN STERILE (TOWEL DISPOSABLE) ×3 IMPLANT
UNDERPAD 30X30 (UNDERPADS AND DIAPERS) ×3 IMPLANT
WATER STERILE IRR 1000ML POUR (IV SOLUTION) ×3 IMPLANT

## 2018-01-20 NOTE — Progress Notes (Signed)
Dr. Conrad Tea notified of elevated BP systolic 080E. Also that pt ate 2 hard boiled eggs at 4AM. This morning.

## 2018-01-20 NOTE — H&P (Signed)
Patient name: Jacob [name] MRN: [id number] DOB: [birth year] Sex: male   REASON FOR CONSULT:    Clotted HeRO graft.   HPI:   Jacob [name] is a pleasant 67 y.o. male, who had a HeRO graft placed in September 2018 in the right arm.  The graft portion of this access was an early access graft.  Patient was seen in consultation on 08/10/2017.  The graft had clotted twice.  The patient had also undergone surgical thrombectomy.  Of note he has a known central venous occlusion on the right.  Dr. early had discussed with the patient removing the graft over a wire and placing a tunneled dialysis catheter and then considering him for a right femoral thigh graft.  On 11/07/2017, the patient underwent a right arm shuntogram.  This showed that there was a stent placed in the midportion of the graft.  He was being considered for removal of the stented portion of the graft and placement of a new segment of graft with placement of a femoral catheter.  He presents with a clotted HeRO right upper arm graft. He last did HD on Monday. He does HD on MWF  Past Medical History:  Diagnosis Date  . Anemia in chronic kidney disease(285.21)   . Anxiety   . Arthritis   . Depression   . ESRD (end stage renal disease) on dialysis [facility])    "MWF; [facility] [place]" (02/18/2017)  . Essential hypertension   . GERD (gastroesophageal reflux disease)   . Glaucoma   . History of blood transfusion   . History of stroke 2016  . Insomnia   . Nonischemic cardiomyopathy ([place])   . Stroke [facility])    mini stroke  . Type 2 diabetes mellitus ([facility])     Family History  Problem Relation Age of Onset  . Heart attack Brother        72  . Heart disease Brother        before age 71  . Hypertension Brother   . Heart attack Brother        45  . Heart attack Brother        26  . Diabetes Mother   . Hypertension Mother   . Heart disease Mother   . Heart disease Father   . Hypertension Father   . Other Father        amputation  .  Hypertension Sister   . Heart disease Sister   . Vision loss Maternal Uncle     SOCIAL HISTORY: Social History   Socioeconomic History  . Marital status: Divorced    Spouse name: Not on file  . Number of children: Not on file  . Years of education: Not on file  . Highest education level: Not on file  Occupational History  . Not on file  Social Needs  . Financial resource strain: Not on file  . Food insecurity:    Worry: Not on file    Inability: Not on file  . Transportation needs:    Medical: Not on file    Non-medical: Not on file  Tobacco Use  . Smoking status: Never Smoker  . Smokeless tobacco: Never Used  Substance and Sexual Activity  . Alcohol use: Yes    Comment: 1- fifth of gin a week  . Drug use: No  . Sexual activity: Yes    Birth control/protection: None  Lifestyle  . Physical activity:    Days per week: Not on file  Minutes per session: Not on file  . Stress: Not on file  Relationships  . Social connections:    Talks on phone: Not on file    Gets together: Not on file    Attends religious service: Not on file    Active member of club or organization: Not on file    Attends meetings of clubs or organizations: Not on file    Relationship status: Not on file  . Intimate partner violence:    Fear of current or ex partner: Not on file    Emotionally abused: Not on file    Physically abused: Not on file    Forced sexual activity: Not on file  Other Topics Concern  . Not on file  Social History Narrative  . Not on file    Allergies  Allergen Reactions  . Bee Venom Anaphylaxis  . Iodine Swelling  . Penicillins Swelling and Other (See Comments)    SWELLING REACTION UNSPECIFIED   Has patient had a PCN reaction causing immediate rash, facial/tongue/throat swelling, SOB or lightheadedness with hypotension: No Has patient had a PCN reaction causing severe rash involving mucus membranes or skin necrosis: No Has patient had a PCN reaction that  required hospitalization No Has patient had a PCN reaction occurring within the last 10 years: No If all of the above answers are "NO", then may proceed with Cephalosporin use.  . Sulfadiazine Other (See Comments)    1% Silver Sulfadiazine cream causes burning over a large area of skin.  [name] Plavix [Clopidogrel] Other (See Comments)    Caused Nose bleed    Current Facility-Administered Medications  Medication Dose Route Frequency Provider Last Rate Last Dose  . 0.9 %  sodium chloride infusion   Intravenous Continuous Waynetta Sandy, MD 10 mL/hr at 01/20/18 1012    . chlorhexidine (HIBICLENS) 4 % liquid 4 application  60 mL Topical Once Waynetta Sandy, MD       And  . Derrill Memo ON 01/21/2018] chlorhexidine (HIBICLENS) 4 % liquid 4 application  60 mL Topical Once Waynetta Sandy, MD      . vancomycin [facility]) 1-5 GM/200ML-% IVPB           . vancomycin (VANCOCIN) IVPB 1000 mg/200 mL premix  1,000 mg Intravenous 60 min Pre-Op Waynetta Sandy, MD        REVIEW OF SYSTEMS:  [X]  denotes positive finding, [ ]  denotes negative finding Cardiac  Comments:  Chest pain or chest pressure:    Shortness of breath upon exertion: x   Short of breath when lying flat:    Irregular heart rhythm:        Vascular    Pain in calf, thigh, or hip brought on by ambulation:    Pain in feet at night that wakes you up from your sleep:     Blood clot in your veins:    Leg swelling:         Pulmonary    Oxygen at home:    Productive cough:     Wheezing:         Neurologic    Sudden weakness in arms or legs:     Sudden numbness in arms or legs:     Sudden onset of difficulty speaking or slurred speech:    Temporary loss of vision in one eye:     Problems with dizziness:         Gastrointestinal    Blood in stool:  Vomited blood:         Genitourinary    Burning when urinating:     Blood in urine:        Psychiatric    Major depression:           Hematologic    Bleeding problems:    Problems with blood clotting too easily:        Skin    Rashes or ulcers:        Constitutional    Fever or chills:     PHYSICAL EXAM:   Vitals:   01/20/18 1000 01/20/18 1022 01/20/18 1044  BP:  (!) 227/83 (!) 209/88  Pulse: 66 62 (!) 59  Resp: 18    Temp: 98 F (36.7 C)    TempSrc: Oral    SpO2: 99%    Weight: 260 lb (117.9 kg)    Height: 6\' 2"  (1.88 m)      GENERAL: The patient is a well-nourished male, in no acute distress. The vital signs are documented above. CARDIAC: There is a regular rate and rhythm.  VASCULAR: no thrill in right upper arm AVG PULMONARY: There is good air exchange bilaterally without wheezing or rales. ABDOMEN: Soft and non-tender with normal pitched bowel sounds.  MUSCULOSKELETAL: Has right BKA NEUROLOGIC: No focal weakness or paresthesias are detected. SKIN: There are no ulcers or rashes noted. PSYCHIATRIC: The patient has a normal affect.  DATA:    Venogram  MEDICAL ISSUES:   CLOTTED RIGHT HeRO GRAFT: Will attempt thrombectomy and revision. I have explained that he might need a catheter. Risks and benefits discussed with pt.   [name] [facility] [phone]

## 2018-01-20 NOTE — Anesthesia Preprocedure Evaluation (Addendum)
Anesthesia Evaluation  Patient identified by MRN, date of birth, ID band Patient awake    Reviewed: Allergy & Precautions, NPO status , Patient's Chart, lab work & pertinent test results  Airway Mallampati: I  TM Distance: >3 FB Neck ROM: Full    Dental   Pulmonary    Pulmonary exam normal        Cardiovascular hypertension, Pt. on medications Normal cardiovascular exam     Neuro/Psych Anxiety Depression CVA    GI/Hepatic GERD  Medicated and Controlled,  Endo/Other  diabetes, Type 2, Insulin Dependent, Oral Hypoglycemic Agents  Renal/GU Renal Insufficiency and DialysisRenal disease     Musculoskeletal   Abdominal   Peds  Hematology   Anesthesia Other Findings   Reproductive/Obstetrics                            Anesthesia Physical Anesthesia Plan  ASA: III  Anesthesia Plan: MAC   Post-op Pain Management:    Induction: Intravenous  PONV Risk Score and Plan: 1  Airway Management Planned: Simple Face Mask  Additional Equipment:   Intra-op Plan:   Post-operative Plan:   Informed Consent: I have reviewed the patients History and Physical, chart, labs and discussed the procedure including the risks, benefits and alternatives for the proposed anesthesia with the patient or authorized representative who has indicated his/her understanding and acceptance.     Plan Discussed with: CRNA and Surgeon  Anesthesia Plan Comments:         Anesthesia Quick Evaluation

## 2018-01-20 NOTE — Transfer of Care (Signed)
Immediate Anesthesia Transfer of Care Note  Patient: Jacob [name]  Procedure(s) Performed: THROMBECTOMY  OF ARTERIOVENTOUS (AV)  HERO GRAFT (Right Arm Upper)  Patient Location: PACU  Anesthesia Type:General  Level of Consciousness: awake, alert  and oriented  Airway & Oxygen Therapy: Patient Spontanous Breathing and Patient connected to nasal cannula oxygen  Post-op Assessment: Report given to RN and Post -op Vital signs reviewed and stable  Post vital signs: Reviewed and stable  Last Vitals:  Vitals Value Taken Time  BP    Temp    Pulse 65 01/20/2018 12:42 PM  Resp 16 01/20/2018 12:42 PM  SpO2 100 % 01/20/2018 12:42 PM  Vitals shown include unvalidated device data.  Last Pain:  Vitals:   01/20/18 1009  TempSrc:   PainSc: 0-No pain      Patients Stated Pain Goal: 0 (57/90/38 3338)  Complications: No apparent anesthesia complications

## 2018-01-20 NOTE — Op Note (Signed)
    NAME: Jacob [name]    MRN: [id number] DOB: 04-21-1951    DATE OF OPERATION: 01/20/2018  PREOP DIAGNOSIS:    Clotted right upper arm AV graft  POSTOP DIAGNOSIS:    Same  PROCEDURE:    Thrombectomy of right upper arm HeRO graft.    SURGEON: Judeth Cornfield. Scot Dock, MD, FACS  ASSIST: Laurence Slate, PA  ANESTHESIA: Local with sedation  EBL: Minimal  INDICATIONS:    KEY CEN is a 67 y.o. male who has a right upper arm HeRO graft.  This is occluded and he presents for thrombectomy.  FINDINGS:   The graft was easily thrombectomized.  There was a good thrill at the completion.  TECHNIQUE:   The patient was taken to the operating room and sedated by anesthesia.  The right upper extremity right neck and right chest were prepped and draped in usual sterile fashion.  After the skin was anesthetized with 1% lidocaine, an incision was made over the junction of the graft and the central venous portion.  This was dissected free.  Patient was heparinized.  The graft was divided distal to the junction.  Using a #4 Fogarty catheter the graft was easily thrombectomized and the plug was clearly retrieved.  The graft was flushed with heparinized saline and clamped.  I passed the catheter multiple times and no further clot was retrieved.  The graft was clamped.  Next the distal aspect was thrombectomized with a #4 Fogarty catheter.  Again this was easily accomplished and there was excellent backbleeding.  This was flushed with heparinized saline.  The graft was then sewn back in the end with continuous 6-0 Prolene suture.  Of note, after the thrombectomy the patient did have a transient drop in their blood pressure which responded to fluid.  At the completion there was a good thrill in the graft.  The heparin was not reversed.  The wound was then closed with a deep layer of 3-0 Vicryl and the skin closed with 4-0 Vicryl.  Dermabond was applied.  Patient tolerated the procedure well was transferred to  the recovery room in stable condition.  All needle and sponge counts were correct.  [name], MD, FACS [facility]  DATE OF DICTATION:   01/20/2018

## 2018-01-21 ENCOUNTER — Encounter (HOSPITAL_COMMUNITY): Payer: Self-pay | Admitting: Vascular Surgery

## 2018-01-21 DIAGNOSIS — N2581 Secondary hyperparathyroidism of renal origin: Secondary | ICD-10-CM | POA: Diagnosis not present

## 2018-01-21 DIAGNOSIS — Z992 Dependence on renal dialysis: Secondary | ICD-10-CM | POA: Diagnosis not present

## 2018-01-21 DIAGNOSIS — N186 End stage renal disease: Secondary | ICD-10-CM | POA: Diagnosis not present

## 2018-01-21 DIAGNOSIS — D509 Iron deficiency anemia, unspecified: Secondary | ICD-10-CM | POA: Diagnosis not present

## 2018-01-21 NOTE — Anesthesia Postprocedure Evaluation (Signed)
Anesthesia Post Note  Patient: Jacob [name]  Procedure(s) Performed: THROMBECTOMY  OF ARTERIOVENTOUS (AV)  HERO GRAFT (Right Arm Upper)     Patient location during evaluation: PACU Anesthesia Type: MAC Level of consciousness: awake Pain management: pain level controlled Vital Signs Assessment: post-procedure vital signs reviewed and stable Respiratory status: spontaneous breathing Cardiovascular status: stable Postop Assessment: no apparent nausea or vomiting Anesthetic complications: no    Last Vitals:  Vitals:   01/20/18 1330 01/20/18 1345  BP: 114/62 116/64  Pulse: (!) 59 60  Resp: 19 20  Temp: (!) 36.2 C 36.6 C  SpO2: 97% 98%    Last Pain:  Vitals:   01/20/18 1345  TempSrc:   PainSc: 0-No pain   Pain Goal: Patients Stated Pain Goal: 0 (01/20/18 1009)               [place]

## 2018-01-24 DIAGNOSIS — N2581 Secondary hyperparathyroidism of renal origin: Secondary | ICD-10-CM | POA: Diagnosis not present

## 2018-01-24 DIAGNOSIS — D509 Iron deficiency anemia, unspecified: Secondary | ICD-10-CM | POA: Diagnosis not present

## 2018-01-24 DIAGNOSIS — Z794 Long term (current) use of insulin: Secondary | ICD-10-CM | POA: Diagnosis not present

## 2018-01-24 DIAGNOSIS — N186 End stage renal disease: Secondary | ICD-10-CM | POA: Diagnosis not present

## 2018-01-24 DIAGNOSIS — Z992 Dependence on renal dialysis: Secondary | ICD-10-CM | POA: Diagnosis not present

## 2018-01-24 DIAGNOSIS — E119 Type 2 diabetes mellitus without complications: Secondary | ICD-10-CM | POA: Diagnosis not present

## 2018-01-26 DIAGNOSIS — N2581 Secondary hyperparathyroidism of renal origin: Secondary | ICD-10-CM | POA: Diagnosis not present

## 2018-01-26 DIAGNOSIS — D509 Iron deficiency anemia, unspecified: Secondary | ICD-10-CM | POA: Diagnosis not present

## 2018-01-26 DIAGNOSIS — Z992 Dependence on renal dialysis: Secondary | ICD-10-CM | POA: Diagnosis not present

## 2018-01-26 DIAGNOSIS — N186 End stage renal disease: Secondary | ICD-10-CM | POA: Diagnosis not present

## 2018-01-28 DIAGNOSIS — D509 Iron deficiency anemia, unspecified: Secondary | ICD-10-CM | POA: Diagnosis not present

## 2018-01-28 DIAGNOSIS — N2581 Secondary hyperparathyroidism of renal origin: Secondary | ICD-10-CM | POA: Diagnosis not present

## 2018-01-28 DIAGNOSIS — Z992 Dependence on renal dialysis: Secondary | ICD-10-CM | POA: Diagnosis not present

## 2018-01-28 DIAGNOSIS — N186 End stage renal disease: Secondary | ICD-10-CM | POA: Diagnosis not present

## 2018-01-30 DIAGNOSIS — Z992 Dependence on renal dialysis: Secondary | ICD-10-CM | POA: Diagnosis not present

## 2018-01-30 DIAGNOSIS — N186 End stage renal disease: Secondary | ICD-10-CM | POA: Diagnosis not present

## 2018-01-31 DIAGNOSIS — D509 Iron deficiency anemia, unspecified: Secondary | ICD-10-CM | POA: Diagnosis not present

## 2018-01-31 DIAGNOSIS — N186 End stage renal disease: Secondary | ICD-10-CM | POA: Diagnosis not present

## 2018-01-31 DIAGNOSIS — Z992 Dependence on renal dialysis: Secondary | ICD-10-CM | POA: Diagnosis not present

## 2018-01-31 DIAGNOSIS — N2581 Secondary hyperparathyroidism of renal origin: Secondary | ICD-10-CM | POA: Diagnosis not present

## 2018-02-02 DIAGNOSIS — N2581 Secondary hyperparathyroidism of renal origin: Secondary | ICD-10-CM | POA: Diagnosis not present

## 2018-02-02 DIAGNOSIS — N186 End stage renal disease: Secondary | ICD-10-CM | POA: Diagnosis not present

## 2018-02-02 DIAGNOSIS — Z992 Dependence on renal dialysis: Secondary | ICD-10-CM | POA: Diagnosis not present

## 2018-02-02 DIAGNOSIS — D509 Iron deficiency anemia, unspecified: Secondary | ICD-10-CM | POA: Diagnosis not present

## 2018-02-04 DIAGNOSIS — N186 End stage renal disease: Secondary | ICD-10-CM | POA: Diagnosis not present

## 2018-02-04 DIAGNOSIS — N2581 Secondary hyperparathyroidism of renal origin: Secondary | ICD-10-CM | POA: Diagnosis not present

## 2018-02-04 DIAGNOSIS — D509 Iron deficiency anemia, unspecified: Secondary | ICD-10-CM | POA: Diagnosis not present

## 2018-02-04 DIAGNOSIS — Z992 Dependence on renal dialysis: Secondary | ICD-10-CM | POA: Diagnosis not present

## 2018-02-07 DIAGNOSIS — Z992 Dependence on renal dialysis: Secondary | ICD-10-CM | POA: Diagnosis not present

## 2018-02-07 DIAGNOSIS — D509 Iron deficiency anemia, unspecified: Secondary | ICD-10-CM | POA: Diagnosis not present

## 2018-02-07 DIAGNOSIS — N2581 Secondary hyperparathyroidism of renal origin: Secondary | ICD-10-CM | POA: Diagnosis not present

## 2018-02-07 DIAGNOSIS — N186 End stage renal disease: Secondary | ICD-10-CM | POA: Diagnosis not present

## 2018-02-09 DIAGNOSIS — N186 End stage renal disease: Secondary | ICD-10-CM | POA: Diagnosis not present

## 2018-02-09 DIAGNOSIS — Z992 Dependence on renal dialysis: Secondary | ICD-10-CM | POA: Diagnosis not present

## 2018-02-09 DIAGNOSIS — D509 Iron deficiency anemia, unspecified: Secondary | ICD-10-CM | POA: Diagnosis not present

## 2018-02-09 DIAGNOSIS — N2581 Secondary hyperparathyroidism of renal origin: Secondary | ICD-10-CM | POA: Diagnosis not present

## 2018-02-10 ENCOUNTER — Telehealth: Payer: Self-pay | Admitting: Cardiology

## 2018-02-10 DIAGNOSIS — E119 Type 2 diabetes mellitus without complications: Secondary | ICD-10-CM | POA: Diagnosis not present

## 2018-02-10 DIAGNOSIS — I428 Other cardiomyopathies: Secondary | ICD-10-CM | POA: Diagnosis not present

## 2018-02-10 DIAGNOSIS — N186 End stage renal disease: Secondary | ICD-10-CM | POA: Diagnosis not present

## 2018-02-10 DIAGNOSIS — Z992 Dependence on renal dialysis: Secondary | ICD-10-CM | POA: Diagnosis not present

## 2018-02-10 DIAGNOSIS — I10 Essential (primary) hypertension: Secondary | ICD-10-CM | POA: Diagnosis not present

## 2018-02-10 NOTE — Telephone Encounter (Signed)
I received communication from [facility] regarding planned peritoneal dialysis catheter placement under general anesthesia, surgical date not yet set.  He has a history of nonischemic cardiomyopathy with normalization of LVEF on medical therapy, last assessed by echocardiogram in July 2018.  He is not on any anticoagulants at this time.  Please schedule a follow-up echocardiogram to ensure no change in LVEF.  Can make final recommendations once this information is reviewed.

## 2018-02-11 DIAGNOSIS — N186 End stage renal disease: Secondary | ICD-10-CM | POA: Diagnosis not present

## 2018-02-11 DIAGNOSIS — D509 Iron deficiency anemia, unspecified: Secondary | ICD-10-CM | POA: Diagnosis not present

## 2018-02-11 DIAGNOSIS — Z992 Dependence on renal dialysis: Secondary | ICD-10-CM | POA: Diagnosis not present

## 2018-02-11 DIAGNOSIS — N2581 Secondary hyperparathyroidism of renal origin: Secondary | ICD-10-CM | POA: Diagnosis not present

## 2018-02-11 NOTE — Telephone Encounter (Signed)
Pt agreeable - orders place and will forward to schedulers

## 2018-02-11 NOTE — Addendum Note (Signed)
Addended by: Julian Hy T on: 02/11/2018 03:00 PM   Modules accepted: Orders

## 2018-02-14 ENCOUNTER — Telehealth: Payer: Self-pay | Admitting: Cardiology

## 2018-02-14 DIAGNOSIS — N186 End stage renal disease: Secondary | ICD-10-CM | POA: Diagnosis not present

## 2018-02-14 DIAGNOSIS — N2581 Secondary hyperparathyroidism of renal origin: Secondary | ICD-10-CM | POA: Diagnosis not present

## 2018-02-14 DIAGNOSIS — Z992 Dependence on renal dialysis: Secondary | ICD-10-CM | POA: Diagnosis not present

## 2018-02-14 DIAGNOSIS — D509 Iron deficiency anemia, unspecified: Secondary | ICD-10-CM | POA: Diagnosis not present

## 2018-02-14 NOTE — Telephone Encounter (Signed)
CORRECTION OF DATE/location  Echo is now scheduled for Jacob [name] per patient request Mar 20, 2018 @ 10:30

## 2018-02-14 NOTE — Telephone Encounter (Signed)
Scheduled echo [facility] Mar 09, 2018

## 2018-02-16 DIAGNOSIS — D509 Iron deficiency anemia, unspecified: Secondary | ICD-10-CM | POA: Diagnosis not present

## 2018-02-16 DIAGNOSIS — Z992 Dependence on renal dialysis: Secondary | ICD-10-CM | POA: Diagnosis not present

## 2018-02-16 DIAGNOSIS — N186 End stage renal disease: Secondary | ICD-10-CM | POA: Diagnosis not present

## 2018-02-16 DIAGNOSIS — N2581 Secondary hyperparathyroidism of renal origin: Secondary | ICD-10-CM | POA: Diagnosis not present

## 2018-02-17 ENCOUNTER — Ambulatory Visit (HOSPITAL_COMMUNITY)
Admission: RE | Admit: 2018-02-17 | Discharge: 2018-02-17 | Disposition: A | Discharge status: Home / Self Care | Payer: Medicare Other | Source: Ambulatory Visit | Attending: Cardiology | Admitting: Cardiology

## 2018-02-17 DIAGNOSIS — I428 Other cardiomyopathies: Secondary | ICD-10-CM

## 2018-02-17 DIAGNOSIS — I1311 Hypertensive heart and chronic kidney disease without heart failure, with stage 5 chronic kidney disease, or end stage renal disease: Secondary | ICD-10-CM | POA: Insufficient documentation

## 2018-02-17 DIAGNOSIS — E782 Mixed hyperlipidemia: Secondary | ICD-10-CM | POA: Insufficient documentation

## 2018-02-17 DIAGNOSIS — E1122 Type 2 diabetes mellitus with diabetic chronic kidney disease: Secondary | ICD-10-CM | POA: Diagnosis not present

## 2018-02-17 DIAGNOSIS — I080 Rheumatic disorders of both mitral and aortic valves: Secondary | ICD-10-CM | POA: Insufficient documentation

## 2018-02-17 DIAGNOSIS — E1151 Type 2 diabetes mellitus with diabetic peripheral angiopathy without gangrene: Secondary | ICD-10-CM | POA: Diagnosis not present

## 2018-02-17 DIAGNOSIS — N186 End stage renal disease: Secondary | ICD-10-CM | POA: Diagnosis not present

## 2018-02-17 NOTE — Progress Notes (Signed)
*  PRELIMINARY RESULTS* Echocardiogram 2D Echocardiogram has been performed.  Jacob [name] 02/17/2018, 11:52 AM

## 2018-02-18 ENCOUNTER — Telehealth: Payer: Self-pay | Admitting: *Deleted

## 2018-02-18 DIAGNOSIS — N186 End stage renal disease: Secondary | ICD-10-CM | POA: Diagnosis not present

## 2018-02-18 DIAGNOSIS — D509 Iron deficiency anemia, unspecified: Secondary | ICD-10-CM | POA: Diagnosis not present

## 2018-02-18 DIAGNOSIS — N2581 Secondary hyperparathyroidism of renal origin: Secondary | ICD-10-CM | POA: Diagnosis not present

## 2018-02-18 DIAGNOSIS — Z992 Dependence on renal dialysis: Secondary | ICD-10-CM | POA: Diagnosis not present

## 2018-02-18 NOTE — Telephone Encounter (Signed)
-----   Message from Satira Sark, MD sent at 02/18/2018  4:43 PM EDT ----- Results reviewed. LVEF remains normal range at 55-60%. Continue with current medication. A copy of this test should be forwarded to Sharilyn Sites, MD.

## 2018-02-18 NOTE — Telephone Encounter (Signed)
Patient informed and copy sent to PCP. 

## 2018-02-21 DIAGNOSIS — Z992 Dependence on renal dialysis: Secondary | ICD-10-CM | POA: Diagnosis not present

## 2018-02-21 DIAGNOSIS — N2581 Secondary hyperparathyroidism of renal origin: Secondary | ICD-10-CM | POA: Diagnosis not present

## 2018-02-21 DIAGNOSIS — D509 Iron deficiency anemia, unspecified: Secondary | ICD-10-CM | POA: Diagnosis not present

## 2018-02-21 DIAGNOSIS — N186 End stage renal disease: Secondary | ICD-10-CM | POA: Diagnosis not present

## 2018-02-23 ENCOUNTER — Telehealth: Payer: Self-pay | Admitting: Cardiology

## 2018-02-23 DIAGNOSIS — D509 Iron deficiency anemia, unspecified: Secondary | ICD-10-CM | POA: Diagnosis not present

## 2018-02-23 DIAGNOSIS — Z992 Dependence on renal dialysis: Secondary | ICD-10-CM | POA: Diagnosis not present

## 2018-02-23 DIAGNOSIS — N2581 Secondary hyperparathyroidism of renal origin: Secondary | ICD-10-CM | POA: Diagnosis not present

## 2018-02-23 DIAGNOSIS — N186 End stage renal disease: Secondary | ICD-10-CM | POA: Diagnosis not present

## 2018-02-23 NOTE — Telephone Encounter (Signed)
His LVEF remains normal at 55 to 60%.  He should be able to continue with plans for peritoneal dialysis catheter placement on medical therapy at relatively low perioperative cardiac risk.

## 2018-02-23 NOTE — Telephone Encounter (Signed)
Armen with [facility] made aware and will fax this correspondence as requested

## 2018-02-23 NOTE — Telephone Encounter (Signed)
Armen with [facility] called wanting to know if patient has been cleared for surgery. Please call [phone].

## 2018-02-23 NOTE — Telephone Encounter (Signed)
Per recent phone note, pt was to have echo to determine cardiac risk for peritoneal dialysis placement under general anesthesia - echo was resulted on 7/19 - will forward to provider

## 2018-02-25 DIAGNOSIS — N186 End stage renal disease: Secondary | ICD-10-CM | POA: Diagnosis not present

## 2018-02-25 DIAGNOSIS — D509 Iron deficiency anemia, unspecified: Secondary | ICD-10-CM | POA: Diagnosis not present

## 2018-02-25 DIAGNOSIS — Z992 Dependence on renal dialysis: Secondary | ICD-10-CM | POA: Diagnosis not present

## 2018-02-25 DIAGNOSIS — N2581 Secondary hyperparathyroidism of renal origin: Secondary | ICD-10-CM | POA: Diagnosis not present

## 2018-02-28 ENCOUNTER — Ambulatory Visit: Payer: Self-pay | Admitting: Surgery

## 2018-02-28 DIAGNOSIS — N2581 Secondary hyperparathyroidism of renal origin: Secondary | ICD-10-CM | POA: Diagnosis not present

## 2018-02-28 DIAGNOSIS — D509 Iron deficiency anemia, unspecified: Secondary | ICD-10-CM | POA: Diagnosis not present

## 2018-02-28 DIAGNOSIS — Z992 Dependence on renal dialysis: Secondary | ICD-10-CM | POA: Diagnosis not present

## 2018-02-28 DIAGNOSIS — N186 End stage renal disease: Secondary | ICD-10-CM | POA: Diagnosis not present

## 2018-03-02 DIAGNOSIS — N2581 Secondary hyperparathyroidism of renal origin: Secondary | ICD-10-CM | POA: Diagnosis not present

## 2018-03-02 DIAGNOSIS — D509 Iron deficiency anemia, unspecified: Secondary | ICD-10-CM | POA: Diagnosis not present

## 2018-03-02 DIAGNOSIS — N186 End stage renal disease: Secondary | ICD-10-CM | POA: Diagnosis not present

## 2018-03-02 DIAGNOSIS — Z992 Dependence on renal dialysis: Secondary | ICD-10-CM | POA: Diagnosis not present

## 2018-03-04 DIAGNOSIS — N2581 Secondary hyperparathyroidism of renal origin: Secondary | ICD-10-CM | POA: Diagnosis not present

## 2018-03-04 DIAGNOSIS — D509 Iron deficiency anemia, unspecified: Secondary | ICD-10-CM | POA: Diagnosis not present

## 2018-03-04 DIAGNOSIS — Z992 Dependence on renal dialysis: Secondary | ICD-10-CM | POA: Diagnosis not present

## 2018-03-04 DIAGNOSIS — N186 End stage renal disease: Secondary | ICD-10-CM | POA: Diagnosis not present

## 2018-03-07 ENCOUNTER — Ambulatory Visit (HOSPITAL_COMMUNITY): Payer: Medicare Other | Admitting: Anesthesiology

## 2018-03-07 ENCOUNTER — Encounter (HOSPITAL_COMMUNITY)
Admission: RE | Disposition: A | Discharge status: Home / Self Care | Payer: Self-pay | Source: Ambulatory Visit | Attending: Vascular Surgery

## 2018-03-07 ENCOUNTER — Ambulatory Visit (HOSPITAL_COMMUNITY): Payer: Medicare Other

## 2018-03-07 ENCOUNTER — Other Ambulatory Visit: Payer: Self-pay

## 2018-03-07 ENCOUNTER — Encounter (HOSPITAL_COMMUNITY): Payer: Self-pay | Admitting: *Deleted

## 2018-03-07 ENCOUNTER — Ambulatory Visit (HOSPITAL_COMMUNITY)
Admission: RE | Admit: 2018-03-07 | Discharge: 2018-03-07 | Disposition: A | Discharge status: Home / Self Care | Payer: Medicare Other | Source: Ambulatory Visit | Attending: Vascular Surgery | Admitting: Vascular Surgery

## 2018-03-07 DIAGNOSIS — Z419 Encounter for procedure for purposes other than remedying health state, unspecified: Secondary | ICD-10-CM

## 2018-03-07 DIAGNOSIS — Z8673 Personal history of transient ischemic attack (TIA), and cerebral infarction without residual deficits: Secondary | ICD-10-CM | POA: Diagnosis not present

## 2018-03-07 DIAGNOSIS — Z452 Encounter for adjustment and management of vascular access device: Secondary | ICD-10-CM

## 2018-03-07 DIAGNOSIS — K219 Gastro-esophageal reflux disease without esophagitis: Secondary | ICD-10-CM | POA: Diagnosis not present

## 2018-03-07 DIAGNOSIS — Y832 Surgical operation with anastomosis, bypass or graft as the cause of abnormal reaction of the patient, or of later complication, without mention of misadventure at the time of the procedure: Secondary | ICD-10-CM | POA: Insufficient documentation

## 2018-03-07 DIAGNOSIS — E1122 Type 2 diabetes mellitus with diabetic chronic kidney disease: Secondary | ICD-10-CM | POA: Diagnosis not present

## 2018-03-07 DIAGNOSIS — N186 End stage renal disease: Secondary | ICD-10-CM | POA: Insufficient documentation

## 2018-03-07 DIAGNOSIS — G4700 Insomnia, unspecified: Secondary | ICD-10-CM | POA: Insufficient documentation

## 2018-03-07 DIAGNOSIS — I120 Hypertensive chronic kidney disease with stage 5 chronic kidney disease or end stage renal disease: Secondary | ICD-10-CM | POA: Diagnosis not present

## 2018-03-07 DIAGNOSIS — T82868A Thrombosis of vascular prosthetic devices, implants and grafts, initial encounter: Secondary | ICD-10-CM | POA: Diagnosis not present

## 2018-03-07 DIAGNOSIS — Z992 Dependence on renal dialysis: Secondary | ICD-10-CM | POA: Diagnosis not present

## 2018-03-07 HISTORY — PX: INSERTION OF DIALYSIS CATHETER: SHX1324

## 2018-03-07 HISTORY — PX: THROMBECTOMY W/ EMBOLECTOMY: SHX2507

## 2018-03-07 LAB — GLUCOSE, CAPILLARY
GLUCOSE-CAPILLARY: 161 mg/dL — AB (ref 70–99)
GLUCOSE-CAPILLARY: 133 mg/dL — AB (ref 70–99)
GLUCOSE-CAPILLARY: 162 mg/dL — AB (ref 70–99)
Glucose-Capillary: 201 mg/dL — ABNORMAL HIGH (ref 70–99)

## 2018-03-07 LAB — BASIC METABOLIC PANEL
ANION GAP: 18 — AB (ref 5–15)
BUN: 63 mg/dL — ABNORMAL HIGH (ref 8–23)
CALCIUM: 8.6 mg/dL — AB (ref 8.9–10.3)
CO2: 24 mmol/L (ref 22–32)
CREATININE: 13.55 mg/dL — AB (ref 0.61–1.24)
Chloride: 94 mmol/L — ABNORMAL LOW (ref 98–111)
GFR, EST AFRICAN AMERICAN: 4 mL/min — AB (ref >60)
GFR, EST NON AFRICAN AMERICAN: 3 mL/min — AB (ref >60)
Glucose, Bld: 187 mg/dL — ABNORMAL HIGH (ref 70–99)
Potassium: 5.1 mmol/L (ref 3.5–5.1)
Sodium: 136 mmol/L (ref 135–145)

## 2018-03-07 LAB — CBC
HEMATOCRIT: 44.3 % (ref 39.0–52.0)
Hemoglobin: 14 g/dL (ref 13.0–17.0)
MCH: 30.4 pg (ref 26.0–34.0)
MCHC: 31.6 g/dL (ref 30.0–36.0)
MCV: 96.1 fL (ref 78.0–100.0)
Platelets: 231 10*3/uL (ref 150–400)
RBC: 4.61 MIL/uL (ref 4.22–5.81)
RDW: 13 % (ref 11.5–15.5)
WBC: 5.7 10*3/uL (ref 4.0–10.5)

## 2018-03-07 LAB — SURGICAL PCR SCREEN
MRSA, PCR: NEGATIVE
Staphylococcus aureus: POSITIVE — AB

## 2018-03-07 SURGERY — THROMBECTOMY ARTERIOVENOUS GORE-TEX GRAFT
Anesthesia: Monitor Anesthesia Care | Laterality: Right

## 2018-03-07 MED ORDER — LIDOCAINE HCL 1 % IJ SOLN
INTRAMUSCULAR | Status: DC | PRN
  Administered 2018-03-07: 15 mL

## 2018-03-07 MED ORDER — 0.9 % SODIUM CHLORIDE (POUR BTL) OPTIME
TOPICAL | Status: DC | PRN
  Administered 2018-03-07: 1000 mL

## 2018-03-07 MED ORDER — OXYCODONE HCL 5 MG/5ML PO SOLN: 5.0000 mg | Freq: Once | ORAL | Status: DC | PRN

## 2018-03-07 MED ORDER — PHENYLEPHRINE HCL 10 MG/ML IJ SOLN
INTRAMUSCULAR | Status: DC | PRN
  Administered 2018-03-07 (×2): 80 ug via INTRAVENOUS

## 2018-03-07 MED ORDER — LIDOCAINE HCL (CARDIAC) PF 100 MG/5ML IV SOSY
PREFILLED_SYRINGE | INTRAVENOUS | Status: DC | PRN
  Administered 2018-03-07: 100 mg via INTRAVENOUS

## 2018-03-07 MED ORDER — OXYCODONE HCL 5 MG PO TABS: 5.0000 mg | ORAL_TABLET | Freq: Once | ORAL | Status: DC | PRN

## 2018-03-07 MED ORDER — OXYCODONE-ACETAMINOPHEN 5-325 MG PO TABS: 1.0000 | ORAL_TABLET | ORAL | 0 refills | Status: DC | PRN

## 2018-03-07 MED ORDER — PROPOFOL 10 MG/ML IV BOLUS
INTRAVENOUS | Status: AC
  Filled 2018-03-07: qty 20

## 2018-03-07 MED ORDER — FENTANYL CITRATE (PF) 100 MCG/2ML IJ SOLN
INTRAMUSCULAR | Status: DC | PRN
  Administered 2018-03-07 (×2): 25 ug via INTRAVENOUS

## 2018-03-07 MED ORDER — HEPARIN SODIUM (PORCINE) 1000 UNIT/ML IJ SOLN
INTRAMUSCULAR | Status: DC | PRN
  Administered 2018-03-07: 1000 [IU]

## 2018-03-07 MED ORDER — FENTANYL CITRATE (PF) 100 MCG/2ML IJ SOLN: 25.0000 ug | INTRAMUSCULAR | Status: DC | PRN

## 2018-03-07 MED ORDER — HEMOSTATIC AGENTS (NO CHARGE) OPTIME
TOPICAL | Status: DC | PRN
  Administered 2018-03-07: 1 via TOPICAL

## 2018-03-07 MED ORDER — PROPOFOL 10 MG/ML IV BOLUS
INTRAVENOUS | Status: DC | PRN
  Administered 2018-03-07: 20 mg via INTRAVENOUS

## 2018-03-07 MED ORDER — MUPIROCIN 2 % EX OINT
TOPICAL_OINTMENT | CUTANEOUS | Status: AC
  Administered 2018-03-07: 1
  Filled 2018-03-07: qty 22

## 2018-03-07 MED ORDER — HEPARIN SODIUM (PORCINE) 1000 UNIT/ML IJ SOLN
INTRAMUSCULAR | Status: AC
  Filled 2018-03-07: qty 1

## 2018-03-07 MED ORDER — PROPOFOL 500 MG/50ML IV EMUL
INTRAVENOUS | Status: DC | PRN
  Administered 2018-03-07: 40 ug/kg/min via INTRAVENOUS

## 2018-03-07 MED ORDER — MEPERIDINE HCL 50 MG/ML IJ SOLN: 6.2500 mg | INTRAMUSCULAR | Status: DC | PRN

## 2018-03-07 MED ORDER — VANCOMYCIN HCL 10 G IV SOLR
1500.0000 mg | INTRAVENOUS | Status: AC
  Administered 2018-03-07: 1500 mg via INTRAVENOUS
  Filled 2018-03-07: qty 1500

## 2018-03-07 MED ORDER — SODIUM CHLORIDE 0.9 % IV SOLN
INTRAVENOUS | Status: AC
  Filled 2018-03-07: qty 1.2

## 2018-03-07 MED ORDER — MIDAZOLAM HCL 2 MG/2ML IJ SOLN
INTRAMUSCULAR | Status: DC | PRN
  Administered 2018-03-07: 2 mg via INTRAVENOUS

## 2018-03-07 MED ORDER — ONDANSETRON HCL 4 MG/2ML IJ SOLN: 4.0000 mg | Freq: Once | INTRAMUSCULAR | Status: DC | PRN

## 2018-03-07 MED ORDER — ACETAMINOPHEN 160 MG/5ML PO SOLN: 325.0000 mg | ORAL | Status: DC | PRN

## 2018-03-07 MED ORDER — SODIUM CHLORIDE 0.9 % IV SOLN
INTRAVENOUS | Status: DC
  Administered 2018-03-07 (×2): via INTRAVENOUS
  Administered 2018-03-07: 10 mL/h via INTRAVENOUS

## 2018-03-07 MED ORDER — SODIUM CHLORIDE 0.9 % IV SOLN
INTRAVENOUS | Status: DC | PRN
  Administered 2018-03-07: 500 mL

## 2018-03-07 MED ORDER — GLYCOPYRROLATE 0.2 MG/ML IJ SOLN
INTRAMUSCULAR | Status: DC | PRN
  Administered 2018-03-07: 0.2 mg via INTRAVENOUS

## 2018-03-07 MED ORDER — MIDAZOLAM HCL 2 MG/2ML IJ SOLN
INTRAMUSCULAR | Status: AC
  Filled 2018-03-07: qty 2

## 2018-03-07 MED ORDER — FENTANYL CITRATE (PF) 250 MCG/5ML IJ SOLN
INTRAMUSCULAR | Status: AC
  Filled 2018-03-07: qty 5

## 2018-03-07 MED ORDER — LIDOCAINE HCL (PF) 1 % IJ SOLN
INTRAMUSCULAR | Status: AC
  Filled 2018-03-07: qty 30

## 2018-03-07 MED ORDER — ACETAMINOPHEN 325 MG PO TABS: 325.0000 mg | ORAL_TABLET | ORAL | Status: DC | PRN

## 2018-03-07 SURGICAL SUPPLY — 65 items
ADH SKN CLS APL DERMABOND .7 (GAUZE/BANDAGES/DRESSINGS) ×1
ARMBAND PINK RESTRICT EXTREMIT (MISCELLANEOUS) ×3 IMPLANT
BAG DECANTER FOR FLEXI CONT (MISCELLANEOUS) ×1 IMPLANT
BIOPATCH RED 1 DISK 7.0 (GAUZE/BANDAGES/DRESSINGS) ×2 IMPLANT
BIOPATCH RED 1IN DISK 7.0MM (GAUZE/BANDAGES/DRESSINGS) ×1
CANISTER SUCT 3000ML PPV (MISCELLANEOUS) ×3 IMPLANT
CANNULA VESSEL 3MM 2 BLNT TIP (CANNULA) ×1 IMPLANT
CATH EMB 4FR 80CM (CATHETERS) ×3 IMPLANT
CATH PALINDROME RT-P 15FX19CM (CATHETERS) IMPLANT
CATH PALINDROME RT-P 15FX23CM (CATHETERS) ×2 IMPLANT
CATH PALINDROME RT-P 15FX28CM (CATHETERS) IMPLANT
CATH PALINDROME RT-P 15FX55CM (CATHETERS) IMPLANT
CLIP LIGATING EXTRA MED SLVR (CLIP) ×1 IMPLANT
CLIP LIGATING EXTRA SM BLUE (MISCELLANEOUS) ×1 IMPLANT
COVER PROBE W GEL 5X96 (DRAPES) ×3 IMPLANT
COVER SURGICAL LIGHT HANDLE (MISCELLANEOUS) ×3 IMPLANT
DECANTER SPIKE VIAL GLASS SM (MISCELLANEOUS) ×3 IMPLANT
DERMABOND ADVANCED (GAUZE/BANDAGES/DRESSINGS) ×2
DERMABOND ADVANCED .7 DNX12 (GAUZE/BANDAGES/DRESSINGS) ×1 IMPLANT
DRAPE C-ARM 42X72 X-RAY (DRAPES) ×3 IMPLANT
DRAPE CHEST BREAST 15X10 FENES (DRAPES) ×3 IMPLANT
DRAPE ORTHO SPLIT 77X108 STRL (DRAPES) ×3
DRAPE SURG ORHT 6 SPLT 77X108 (DRAPES) IMPLANT
ELECT REM PT RETURN 9FT ADLT (ELECTROSURGICAL) ×3
ELECTRODE REM PT RTRN 9FT ADLT (ELECTROSURGICAL) ×1 IMPLANT
GAUZE SPONGE 4X4 16PLY XRAY LF (GAUZE/BANDAGES/DRESSINGS) ×1 IMPLANT
GLOVE BIO SURGEON STRL SZ 6.5 (GLOVE) ×3 IMPLANT
GLOVE BIO SURGEON STRL SZ7.5 (GLOVE) ×2 IMPLANT
GLOVE BIO SURGEONS STRL SZ 6.5 (GLOVE) ×3
GLOVE BIOGEL PI IND STRL 6.5 (GLOVE) IMPLANT
GLOVE BIOGEL PI INDICATOR 6.5 (GLOVE) ×2
GLOVE SS BIOGEL STRL SZ 7.5 (GLOVE) ×1 IMPLANT
GLOVE SUPERSENSE BIOGEL SZ 7.5 (GLOVE)
GOWN STRL REUS W/ TWL LRG LVL3 (GOWN DISPOSABLE) ×3 IMPLANT
GOWN STRL REUS W/TWL LRG LVL3 (GOWN DISPOSABLE) ×9
KIT BASIN OR (CUSTOM PROCEDURE TRAY) ×3 IMPLANT
KIT TURNOVER KIT B (KITS) ×3 IMPLANT
NDL 18GX1X1/2 (RX/OR ONLY) (NEEDLE) ×1 IMPLANT
NDL HYPO 25GX1X1/2 BEV (NEEDLE) ×1 IMPLANT
NEEDLE 18GX1X1/2 (RX/OR ONLY) (NEEDLE) ×3 IMPLANT
NEEDLE 22X1 1/2 (OR ONLY) (NEEDLE) IMPLANT
NEEDLE HYPO 25GX1X1/2 BEV (NEEDLE) IMPLANT
NS IRRIG 1000ML POUR BTL (IV SOLUTION) ×3 IMPLANT
PACK CV ACCESS (CUSTOM PROCEDURE TRAY) ×3 IMPLANT
PACK SURGICAL SETUP 50X90 (CUSTOM PROCEDURE TRAY) ×3 IMPLANT
PAD ARMBOARD 7.5X6 YLW CONV (MISCELLANEOUS) ×6 IMPLANT
SOAP 2 % CHG 4 OZ (WOUND CARE) ×3 IMPLANT
SUT ETHILON 3 0 PS 1 (SUTURE) ×3 IMPLANT
SUT MNCRL AB 4-0 PS2 18 (SUTURE) ×4 IMPLANT
SUT PDS 6 0 BV (SUTURE) ×2 IMPLANT
SUT PROLENE 6 0 BV (SUTURE) IMPLANT
SUT PROLENE 6 0 CC (SUTURE) ×1 IMPLANT
SUT VIC AB 3-0 SH 27 (SUTURE) ×9
SUT VIC AB 3-0 SH 27X BRD (SUTURE) ×1 IMPLANT
SUT VIC AB 3-0 SH 27XBRD (SUTURE) IMPLANT
SUT VICRYL 4-0 PS2 18IN ABS (SUTURE) ×1 IMPLANT
SYR 10ML LL (SYRINGE) ×3 IMPLANT
SYR 20CC LL (SYRINGE) ×3 IMPLANT
SYR 5ML LL (SYRINGE) ×6 IMPLANT
SYR CONTROL 10ML LL (SYRINGE) ×3 IMPLANT
TOWEL GREEN STERILE (TOWEL DISPOSABLE) ×6 IMPLANT
TOWEL GREEN STERILE FF (TOWEL DISPOSABLE) ×3 IMPLANT
UNDERPAD 30X30 (UNDERPADS AND DIAPERS) ×3 IMPLANT
WATER STERILE IRR 1000ML POUR (IV SOLUTION) ×3 IMPLANT
WIRE AMPLATZ SS-J .035X260CM (WIRE) ×2 IMPLANT

## 2018-03-07 NOTE — Anesthesia Postprocedure Evaluation (Signed)
Anesthesia Post Note  Patient: Jacob [name]  Procedure(s) Performed: THROMBECTOMY OF HERO GRAFT; POSSIBLE REMOVAL (Right ) INSERTION OF DIALYSIS CATHETER (Right )     Patient location during evaluation: PACU Anesthesia Type: MAC Level of consciousness: awake and alert Pain management: pain level controlled Vital Signs Assessment: post-procedure vital signs reviewed and stable Respiratory status: spontaneous breathing, nonlabored ventilation and respiratory function stable Cardiovascular status: stable and blood pressure returned to baseline Postop Assessment: no apparent nausea or vomiting Anesthetic complications: no    Last Vitals:  Vitals:   03/07/18 1629 03/07/18 1649  BP: 125/81 134/74  Pulse: 78 74  Resp: 16 16  Temp:    SpO2: 100% 100%    Last Pain:  Vitals:   03/07/18 1623  TempSrc:   PainSc: 0-No pain                 Governor Matos,W. EDMOND

## 2018-03-07 NOTE — OR Nursing (Signed)
When questioned concerning iodine allergy, I asked if we had used the orange betadine soap before and he said "yeah" and stated that he didn't have any problem with it.

## 2018-03-07 NOTE — Anesthesia Preprocedure Evaluation (Signed)
Anesthesia Evaluation  Patient identified by MRN, date of birth, ID band Patient awake    Reviewed: Allergy & Precautions, NPO status , Patient's Chart, lab work & pertinent test results  Airway Mallampati: I  TM Distance: >3 FB Neck ROM: Full    Dental   Pulmonary    Pulmonary exam normal        Cardiovascular hypertension, Pt. on medications Normal cardiovascular exam  Echo 18 Study Conclusions  - Left ventricle: The cavity size was normal. Wall thickness was   increased in a pattern of severe LVH. Systolic function was   normal. The estimated ejection fraction was in the range of 55%   to 60%. Wall motion was normal; there were no regional wall   motion abnormalities. Doppler parameters are consistent with   abnormal left ventricular relaxation (grade 1 diastolic   dysfunction).   Neuro/Psych Anxiety Depression CVA    GI/Hepatic GERD  Medicated and Controlled,  Endo/Other  diabetes, Type 2, Insulin Dependent, Oral Hypoglycemic Agents  Renal/GU Renal Insufficiency and DialysisRenal disease     Musculoskeletal   Abdominal   Peds  Hematology   Anesthesia Other Findings   Reproductive/Obstetrics                            Anesthesia Physical  Anesthesia Plan  ASA: III  Anesthesia Plan: MAC   Post-op Pain Management:    Induction: Intravenous  PONV Risk Score and Plan: 1 and Treatment may vary due to age or medical condition  Airway Management Planned: Simple Face Mask, Nasal Cannula and Natural Airway  Additional Equipment:   Intra-op Plan:   Post-operative Plan:   Informed Consent: I have reviewed the patients History and Physical, chart, labs and discussed the procedure including the risks, benefits and alternatives for the proposed anesthesia with the patient or authorized representative who has indicated his/her understanding and acceptance.     Plan Discussed with:  CRNA, Surgeon and Anesthesiologist  Anesthesia Plan Comments:        Anesthesia Quick Evaluation

## 2018-03-07 NOTE — Op Note (Signed)
OPERATIVE NOTE   PROCEDURE: 1. Removal of central venous component of HeRO graft-catheter 2. Exchange of central component for right internal jugular vein tunneled dialysis catheter   PRE-OPERATIVE DIAGNOSIS: recurrent thrombosis of HeRO graft-catheter  POST-OPERATIVE DIAGNOSIS: same as above   SURGEON: [name], MD  ASSISTANT(S): RNFA  ANESTHESIA: local and MAC  ESTIMATED BLOOD LOSS: 50 cc  FINDING(S): 1.  Patent central venous component.  Thrombosed graft component 2.  Tips of right internal jugular vein tunneled dialysis catheter in right atrium 3.  No obvious pneumothorax on fluoroscopy  SPECIMEN(S):  none  INDICATIONS:   Jacob [name] is a 67 y.o. 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  This graft had previously had a stent placed and also a thrombectomy completed 3 month previously.  I discussed with the patient that likely the stent had caused the occlusion of the graft and was unlikely to be opened with a simple thrombectomy.  I recommended removal of the central component of the HeRO graft-catheter and exchanging it for a tunneled dialysis catheter if possible.  The patient is aware the risks of tunneled dialysis catheter placement include but are not limited to: bleeding, infection, central venous injury, pneumothorax, possible venous stenosis, possible malpositioning in the venous system, and possible infections related to long-term catheter presence.  The patient was aware of these risks and agreed to proceed.   DESCRIPTION: After obtaining full informed written consent, the patient was brought back to the operating room and placed supine upon the operating table.  The patient received IV antibiotics prior to induction.  A procedure time out was completed and the correct surgical site was verified.  After obtaining adequate anesthesia, the patient was prepped and draped in the standard fashion for: tunneled dialysis catheter.  The  right side of the prepped field had been extended to include the deltoid groove where the connector between the graft and catheter component resided.  I injected a total of 15 cc of 1% lidocaine without epinephrine over the connector, genu in the central component into the right internal jugular vein and along the subcutaneous tunnel for the tunneled dialysis catheter.    I made an incision over the connected and dissected down the connector with electrocautery.  I was able to fully dissect out the entire connected.  I tied the graft distal to the connected with a 2-0 silk.  I clamped the central component proximal to the connector.  I transected the graft.  I then turned my attention to the skin overlying the turn of the central component to the right internal jugular vein.  I made an incision and then dissected down to the central component with electrocautery.  There was extensive scarring, so this dissection was slower than expected.  Eventually I was able to get a 3 cm segment of the central component.  I clamped the component proximally then transected the catheter distally in the incision.  I pulled out the distal end of the central venous component and passed it off the field.    I clamped the wall of the central component and then release the transverse clamp on the component.  I passed an Amplatz into the inferior vena cava.  There was active bleeding from this component without any thrombus.  I removed the central component over the wire and passed it off the field.  I blunt compressed the subcutaneous tract to limit bleeding during this process.  The dilator-sheath was loaded over  the wire into the internal jugular vein over the wire.  I removed the wire and dilator.  A 23 cm Palindrome was loaded into the right atrium.  I then made a stab incision at the previously determine exit site.  I then dissected from the exit site to the right neck incision with the metal dissector. I clamped the Palindrome  and transected the back-end of the catheter.  The back-end of the catheter was loaded onto the metal dissector.  I put the catheter through the subcutaneous tract.  I transected the catheter again to release the dissector.  I loaded the collar onto the catheter and then loaded the two ports onto the backend of the catheter.  The collar was snapped into place.  I pulled the catheter into appropriate position under fluoroscopy.  The route of the catheter appeared to be appropriate without kinking.    I tested the catheter with repeated aspiration and flushing manuevers of each port of this tunneled dialysis catheter.  There was no resistance.  Each port was loaded with heparinized saline.  The catheter was secured in place with a 3-0 Nylon tied to the tunneled dialysis catheter.   The connector incision was was washed out and packed with Avitene.  The neck incision was washed out and packed with Avitene.  After waiting a few minutes, each incision no longer had bleeding.  The connector incision was repaired with a running stitch of 3-0 Vicryl in the subcutaneous tissue, covering the graft component.  The skin was reapproximated with a running subcuticular stitch of 4-0 Monocryl.  The skin was cleaned, dried, and reinforced with Dermabond.  I then turned my attention to the neck incision.  The subcutaneous tissue was reapproximated with a double layer of 3-0 Vicryl.  The skin was reapproximated with a running subcuticular stitch of 4-0 Monocryl.  The skin was cleaned, dried, and reinforced with Dermabond.    COMPLICATIONS: none  CONDITION: stable   [name], MD, [facility] [place] Office: [phone] Pager: [phone]  03/07/2018, 3:59 PM

## 2018-03-07 NOTE — Transfer of Care (Signed)
Immediate Anesthesia Transfer of Care Note  Patient: Jacob [name]  Procedure(s) Performed: THROMBECTOMY OF HERO GRAFT; POSSIBLE REMOVAL (Right ) INSERTION OF DIALYSIS CATHETER (Right )  Patient Location: PACU  Anesthesia Type:MAC  Level of Consciousness: awake, alert  and oriented  Airway & Oxygen Therapy: Patient Spontanous Breathing  Post-op Assessment: Report given to RN, Post -op Vital signs reviewed and stable and Patient moving all extremities  Post vital signs: Reviewed and stable  Last Vitals:  Vitals Value Taken Time  BP 143/109 03/07/2018  4:22 PM  Temp    Pulse 83 03/07/2018  4:26 PM  Resp 18 03/07/2018  4:26 PM  SpO2 100 % 03/07/2018  4:26 PM  Vitals shown include unvalidated device data.  Last Pain:  Vitals:   03/07/18 1137  TempSrc:   PainSc: 3       Patients Stated Pain Goal: 3 (11/94/17 4081)  Complications: No apparent anesthesia complications

## 2018-03-08 ENCOUNTER — Encounter (HOSPITAL_COMMUNITY): Payer: Self-pay | Admitting: Vascular Surgery

## 2018-03-08 ENCOUNTER — Telehealth: Payer: Self-pay | Admitting: Vascular Surgery

## 2018-03-08 ENCOUNTER — Inpatient Hospital Stay (HOSPITAL_COMMUNITY): Admission: RE | Admit: 2018-03-08 | Payer: Medicare Other | Source: Ambulatory Visit

## 2018-03-08 DIAGNOSIS — D509 Iron deficiency anemia, unspecified: Secondary | ICD-10-CM | POA: Diagnosis not present

## 2018-03-08 DIAGNOSIS — N2581 Secondary hyperparathyroidism of renal origin: Secondary | ICD-10-CM | POA: Diagnosis not present

## 2018-03-08 DIAGNOSIS — N186 End stage renal disease: Secondary | ICD-10-CM | POA: Diagnosis not present

## 2018-03-08 DIAGNOSIS — Z992 Dependence on renal dialysis: Secondary | ICD-10-CM | POA: Diagnosis not present

## 2018-03-08 NOTE — Telephone Encounter (Signed)
Spoke with pt. To confirm appt. 2wks p/o PA s/p vein dialysis cath 03/22/18 4pm

## 2018-03-09 ENCOUNTER — Other Ambulatory Visit: Payer: Self-pay

## 2018-03-09 ENCOUNTER — Other Ambulatory Visit: Payer: Medicare Other

## 2018-03-09 ENCOUNTER — Encounter (HOSPITAL_COMMUNITY): Payer: Self-pay | Admitting: *Deleted

## 2018-03-09 DIAGNOSIS — N186 End stage renal disease: Secondary | ICD-10-CM | POA: Diagnosis not present

## 2018-03-09 DIAGNOSIS — N2581 Secondary hyperparathyroidism of renal origin: Secondary | ICD-10-CM | POA: Diagnosis not present

## 2018-03-09 DIAGNOSIS — Z992 Dependence on renal dialysis: Secondary | ICD-10-CM | POA: Diagnosis not present

## 2018-03-09 DIAGNOSIS — D509 Iron deficiency anemia, unspecified: Secondary | ICD-10-CM | POA: Diagnosis not present

## 2018-03-09 NOTE — Progress Notes (Signed)
Pt denies any acute cardiopulmonary issues. Pt under the care of Dr. Domenic Polite (see cardiac clearance note in Epic dated 02/23/18 ). Pt denies having a cardiac cath. Pt made aware to stop taking vitamins, fish oil and herbal medications. Do not take any NSAIDs ie: Ibuprofen, Advil, Naproxen (Aleve), Motrin, Voltaren, BC and Goody Powder or any medication containing Aspirin. Pt verbalized understanding of all pre-op instructions.

## 2018-03-09 NOTE — Progress Notes (Signed)
   03/09/18 1213  OBSTRUCTIVE SLEEP APNEA  Have you ever been diagnosed with sleep apnea through a sleep study? No  Do you snore loudly (loud enough to be heard through closed doors)?  1  Do you often feel tired, fatigued, or sleepy during the daytime (such as falling asleep during driving or talking to someone)? 0  Has anyone observed you stop breathing during your sleep? 0  Do you have, or are you being treated for high blood pressure? 1  BMI more than 35 kg/m2? 0  Age > 50 (1-yes) 1  Neck circumference greater than:Male 16 inches or larger, Male 17inches or larger?  (assess dos)  Male Gender (Yes=1) 1  Obstructive Sleep Apnea Score 4

## 2018-03-10 MED ORDER — VANCOMYCIN HCL 10 G IV SOLR
1500.0000 mg | INTRAVENOUS | Status: AC
  Administered 2018-03-11: 1500 mg via INTRAVENOUS
  Filled 2018-03-10: qty 1500

## 2018-03-11 ENCOUNTER — Ambulatory Visit (HOSPITAL_COMMUNITY): Payer: Medicare Other | Admitting: Certified Registered Nurse Anesthetist

## 2018-03-11 ENCOUNTER — Encounter (HOSPITAL_COMMUNITY)
Admission: RE | Disposition: A | Discharge status: Home / Self Care | Payer: Self-pay | Source: Ambulatory Visit | Attending: Surgery

## 2018-03-11 ENCOUNTER — Other Ambulatory Visit: Payer: Self-pay

## 2018-03-11 ENCOUNTER — Ambulatory Visit (HOSPITAL_COMMUNITY)
Admission: RE | Admit: 2018-03-11 | Discharge: 2018-03-11 | Disposition: A | Discharge status: Home / Self Care | Payer: Medicare Other | Source: Ambulatory Visit | Attending: Surgery | Admitting: Surgery

## 2018-03-11 ENCOUNTER — Encounter (HOSPITAL_COMMUNITY): Payer: Self-pay | Admitting: *Deleted

## 2018-03-11 DIAGNOSIS — Z992 Dependence on renal dialysis: Secondary | ICD-10-CM | POA: Diagnosis not present

## 2018-03-11 DIAGNOSIS — Z91030 Bee allergy status: Secondary | ICD-10-CM | POA: Insufficient documentation

## 2018-03-11 DIAGNOSIS — Z880 Allergy status to penicillin: Secondary | ICD-10-CM | POA: Diagnosis not present

## 2018-03-11 DIAGNOSIS — K4090 Unilateral inguinal hernia, without obstruction or gangrene, not specified as recurrent: Secondary | ICD-10-CM | POA: Diagnosis not present

## 2018-03-11 DIAGNOSIS — E1122 Type 2 diabetes mellitus with diabetic chronic kidney disease: Secondary | ICD-10-CM | POA: Diagnosis not present

## 2018-03-11 DIAGNOSIS — Z888 Allergy status to other drugs, medicaments and biological substances status: Secondary | ICD-10-CM | POA: Diagnosis not present

## 2018-03-11 DIAGNOSIS — Z882 Allergy status to sulfonamides status: Secondary | ICD-10-CM | POA: Diagnosis not present

## 2018-03-11 DIAGNOSIS — Z8249 Family history of ischemic heart disease and other diseases of the circulatory system: Secondary | ICD-10-CM | POA: Insufficient documentation

## 2018-03-11 DIAGNOSIS — Z6833 Body mass index (BMI) 33.0-33.9, adult: Secondary | ICD-10-CM | POA: Insufficient documentation

## 2018-03-11 DIAGNOSIS — I429 Cardiomyopathy, unspecified: Secondary | ICD-10-CM | POA: Diagnosis not present

## 2018-03-11 DIAGNOSIS — M1990 Unspecified osteoarthritis, unspecified site: Secondary | ICD-10-CM | POA: Insufficient documentation

## 2018-03-11 DIAGNOSIS — Z79899 Other long term (current) drug therapy: Secondary | ICD-10-CM | POA: Diagnosis not present

## 2018-03-11 DIAGNOSIS — K219 Gastro-esophageal reflux disease without esophagitis: Secondary | ICD-10-CM | POA: Diagnosis not present

## 2018-03-11 DIAGNOSIS — F419 Anxiety disorder, unspecified: Secondary | ICD-10-CM | POA: Insufficient documentation

## 2018-03-11 DIAGNOSIS — Z794 Long term (current) use of insulin: Secondary | ICD-10-CM | POA: Diagnosis not present

## 2018-03-11 DIAGNOSIS — I120 Hypertensive chronic kidney disease with stage 5 chronic kidney disease or end stage renal disease: Secondary | ICD-10-CM | POA: Insufficient documentation

## 2018-03-11 DIAGNOSIS — Z91041 Radiographic dye allergy status: Secondary | ICD-10-CM | POA: Insufficient documentation

## 2018-03-11 DIAGNOSIS — E1151 Type 2 diabetes mellitus with diabetic peripheral angiopathy without gangrene: Secondary | ICD-10-CM | POA: Diagnosis not present

## 2018-03-11 DIAGNOSIS — N186 End stage renal disease: Secondary | ICD-10-CM | POA: Diagnosis not present

## 2018-03-11 DIAGNOSIS — K429 Umbilical hernia without obstruction or gangrene: Secondary | ICD-10-CM | POA: Diagnosis not present

## 2018-03-11 DIAGNOSIS — F329 Major depressive disorder, single episode, unspecified: Secondary | ICD-10-CM | POA: Diagnosis not present

## 2018-03-11 DIAGNOSIS — Z8673 Personal history of transient ischemic attack (TIA), and cerebral infarction without residual deficits: Secondary | ICD-10-CM | POA: Insufficient documentation

## 2018-03-11 DIAGNOSIS — E669 Obesity, unspecified: Secondary | ICD-10-CM | POA: Insufficient documentation

## 2018-03-11 DIAGNOSIS — G4700 Insomnia, unspecified: Secondary | ICD-10-CM | POA: Insufficient documentation

## 2018-03-11 HISTORY — PX: CAPD INSERTION: SHX5233

## 2018-03-11 LAB — GLUCOSE, CAPILLARY
GLUCOSE-CAPILLARY: 123 mg/dL — AB (ref 70–99)
Glucose-Capillary: 84 mg/dL (ref 70–99)

## 2018-03-11 LAB — POCT I-STAT 4, (NA,K, GLUC, HGB,HCT)
GLUCOSE: 115 mg/dL — AB (ref 70–99)
HEMATOCRIT: 42 % (ref 39.0–52.0)
HEMOGLOBIN: 14.3 g/dL (ref 13.0–17.0)
Potassium: 4.2 mmol/L (ref 3.5–5.1)
Sodium: 139 mmol/L (ref 135–145)

## 2018-03-11 SURGERY — LAPAROSCOPIC INSERTION CONTINUOUS AMBULATORY PERITONEAL DIALYSIS  (CAPD) CATHETER
Anesthesia: General | Site: Abdomen

## 2018-03-11 MED ORDER — ACETAMINOPHEN 500 MG PO TABS
ORAL_TABLET | ORAL | Status: AC
  Administered 2018-03-11: 1000 mg via ORAL
  Filled 2018-03-11: qty 2

## 2018-03-11 MED ORDER — FENTANYL CITRATE (PF) 250 MCG/5ML IJ SOLN
INTRAMUSCULAR | Status: DC | PRN
  Administered 2018-03-11: 100 ug via INTRAVENOUS

## 2018-03-11 MED ORDER — GABAPENTIN 300 MG PO CAPS
300.0000 mg | ORAL_CAPSULE | ORAL | Status: AC
  Administered 2018-03-11: 300 mg via ORAL

## 2018-03-11 MED ORDER — HYDROCODONE-ACETAMINOPHEN 5-325 MG PO TABS: 1.0000 | ORAL_TABLET | Freq: Four times a day (QID) | ORAL | 0 refills | Status: DC | PRN

## 2018-03-11 MED ORDER — ONDANSETRON HCL 4 MG/2ML IJ SOLN
INTRAMUSCULAR | Status: DC | PRN
  Administered 2018-03-11: 4 mg via INTRAVENOUS

## 2018-03-11 MED ORDER — HYDROCODONE-ACETAMINOPHEN 5-325 MG PO TABS: 1.0000 | ORAL_TABLET | Freq: Once | ORAL | Status: DC | PRN

## 2018-03-11 MED ORDER — MIDAZOLAM HCL 2 MG/2ML IJ SOLN
INTRAMUSCULAR | Status: AC
  Filled 2018-03-11: qty 2

## 2018-03-11 MED ORDER — OXYCODONE HCL 5 MG PO TABS: 5.0000 mg | ORAL_TABLET | ORAL | Status: DC | PRN

## 2018-03-11 MED ORDER — LIDOCAINE HCL (CARDIAC) PF 100 MG/5ML IV SOSY
PREFILLED_SYRINGE | INTRAVENOUS | Status: DC | PRN
  Administered 2018-03-11: 60 mg via INTRAVENOUS

## 2018-03-11 MED ORDER — ROCURONIUM BROMIDE 10 MG/ML (PF) SYRINGE
PREFILLED_SYRINGE | INTRAVENOUS | Status: AC
  Filled 2018-03-11: qty 10

## 2018-03-11 MED ORDER — SODIUM CHLORIDE 0.9 % IV SOLN
INTRAVENOUS | Status: DC | PRN
  Administered 2018-03-11: 300 mL

## 2018-03-11 MED ORDER — SODIUM CHLORIDE 0.9 % IR SOLN
Status: DC | PRN
  Administered 2018-03-11: 500 mL

## 2018-03-11 MED ORDER — LIDOCAINE 2% (20 MG/ML) 5 ML SYRINGE
INTRAMUSCULAR | Status: AC
  Filled 2018-03-11: qty 10

## 2018-03-11 MED ORDER — FENTANYL CITRATE (PF) 100 MCG/2ML IJ SOLN: 25.0000 ug | INTRAMUSCULAR | Status: DC | PRN

## 2018-03-11 MED ORDER — ONDANSETRON HCL 4 MG/2ML IJ SOLN: 4.0000 mg | Freq: Once | INTRAMUSCULAR | Status: DC | PRN

## 2018-03-11 MED ORDER — BUPIVACAINE-EPINEPHRINE 0.25% -1:200000 IJ SOLN
INTRAMUSCULAR | Status: DC | PRN
  Administered 2018-03-11: 6 mL

## 2018-03-11 MED ORDER — SODIUM CHLORIDE 0.9 % IV SOLN
INTRAVENOUS | Status: AC
  Filled 2018-03-11: qty 1.2

## 2018-03-11 MED ORDER — PHENYLEPHRINE HCL 10 MG/ML IJ SOLN
INTRAMUSCULAR | Status: DC | PRN
  Administered 2018-03-11: 120 ug via INTRAVENOUS
  Administered 2018-03-11: 160 ug via INTRAVENOUS

## 2018-03-11 MED ORDER — FENTANYL CITRATE (PF) 250 MCG/5ML IJ SOLN
INTRAMUSCULAR | Status: AC
  Filled 2018-03-11: qty 5

## 2018-03-11 MED ORDER — DOCUSATE SODIUM 100 MG PO CAPS: 100.0000 mg | ORAL_CAPSULE | Freq: Two times a day (BID) | ORAL | 0 refills | Status: AC

## 2018-03-11 MED ORDER — EPHEDRINE SULFATE 50 MG/ML IJ SOLN
INTRAMUSCULAR | Status: DC | PRN
  Administered 2018-03-11 (×2): 5 mg via INTRAVENOUS
  Administered 2018-03-11: 10 mg via INTRAVENOUS

## 2018-03-11 MED ORDER — ONDANSETRON HCL 4 MG/2ML IJ SOLN
INTRAMUSCULAR | Status: AC
  Filled 2018-03-11: qty 6

## 2018-03-11 MED ORDER — GABAPENTIN 300 MG PO CAPS
ORAL_CAPSULE | ORAL | Status: AC
  Administered 2018-03-11: 300 mg via ORAL
  Filled 2018-03-11: qty 1

## 2018-03-11 MED ORDER — PROPOFOL 10 MG/ML IV BOLUS
INTRAVENOUS | Status: DC | PRN
  Administered 2018-03-11: 150 mg via INTRAVENOUS

## 2018-03-11 MED ORDER — PHENYLEPHRINE 40 MCG/ML (10ML) SYRINGE FOR IV PUSH (FOR BLOOD PRESSURE SUPPORT)
PREFILLED_SYRINGE | INTRAVENOUS | Status: AC
  Filled 2018-03-11: qty 10

## 2018-03-11 MED ORDER — SUGAMMADEX SODIUM 200 MG/2ML IV SOLN
INTRAVENOUS | Status: DC | PRN
  Administered 2018-03-11: 238.6 mg via INTRAVENOUS

## 2018-03-11 MED ORDER — CHLORHEXIDINE GLUCONATE 4 % EX LIQD: 60.0000 mL | Freq: Once | CUTANEOUS | Status: DC

## 2018-03-11 MED ORDER — ROCURONIUM BROMIDE 100 MG/10ML IV SOLN
INTRAVENOUS | Status: DC | PRN
  Administered 2018-03-11: 50 mg via INTRAVENOUS

## 2018-03-11 MED ORDER — HEPARIN SOD (PORK) LOCK FLUSH 100 UNIT/ML IV SOLN
INTRAVENOUS | Status: AC
  Filled 2018-03-11: qty 5

## 2018-03-11 MED ORDER — DEXAMETHASONE SODIUM PHOSPHATE 10 MG/ML IJ SOLN
INTRAMUSCULAR | Status: AC
  Filled 2018-03-11: qty 3

## 2018-03-11 MED ORDER — SODIUM CHLORIDE 0.9 % IV SOLN
INTRAVENOUS | Status: DC | PRN
  Administered 2018-03-11: 50 ug/min via INTRAVENOUS

## 2018-03-11 MED ORDER — PROPOFOL 10 MG/ML IV BOLUS
INTRAVENOUS | Status: AC
  Filled 2018-03-11: qty 20

## 2018-03-11 MED ORDER — EPHEDRINE 5 MG/ML INJ
INTRAVENOUS | Status: AC
  Filled 2018-03-11: qty 30

## 2018-03-11 MED ORDER — ACETAMINOPHEN 500 MG PO TABS
1000.0000 mg | ORAL_TABLET | ORAL | Status: AC
  Administered 2018-03-11: 1000 mg via ORAL

## 2018-03-11 MED ORDER — BUPIVACAINE-EPINEPHRINE (PF) 0.25% -1:200000 IJ SOLN
INTRAMUSCULAR | Status: AC
  Filled 2018-03-11: qty 30

## 2018-03-11 MED ORDER — SODIUM CHLORIDE 0.9 % IV SOLN
INTRAVENOUS | Status: DC
  Administered 2018-03-11: 08:00:00 via INTRAVENOUS

## 2018-03-11 SURGICAL SUPPLY — 39 items
ADAPTER TITANIUM MEDIONICS (MISCELLANEOUS) ×2 IMPLANT
ADH SKN CLS APL DERMABOND .7 (GAUZE/BANDAGES/DRESSINGS) ×1
ADPR DLYS CATH STRL LF DISP (MISCELLANEOUS) ×1
BAG DECANTER FOR FLEXI CONT (MISCELLANEOUS) ×2 IMPLANT
BLADE CLIPPER SURG (BLADE) IMPLANT
CANISTER SUCT 3000ML PPV (MISCELLANEOUS) IMPLANT
CATH EXTENDED DIALYSIS (CATHETERS) ×2 IMPLANT
CHLORAPREP W/TINT 26ML (MISCELLANEOUS) ×2 IMPLANT
COVER SURGICAL LIGHT HANDLE (MISCELLANEOUS) ×2 IMPLANT
DECANTER SPIKE VIAL GLASS SM (MISCELLANEOUS) ×2 IMPLANT
DERMABOND ADVANCED (GAUZE/BANDAGES/DRESSINGS) ×1
DERMABOND ADVANCED .7 DNX12 (GAUZE/BANDAGES/DRESSINGS) ×1 IMPLANT
DEVICE PMI PUNCTURE CLOSURE (MISCELLANEOUS) ×2 IMPLANT
DRSG TEGADERM 4X4.75 (GAUZE/BANDAGES/DRESSINGS) ×4 IMPLANT
ELECT REM PT RETURN 9FT ADLT (ELECTROSURGICAL) ×2
ELECTRODE REM PT RTRN 9FT ADLT (ELECTROSURGICAL) ×1 IMPLANT
GAUZE SPONGE 4X4 12PLY STRL (GAUZE/BANDAGES/DRESSINGS) ×2 IMPLANT
GLOVE BIO SURGEON STRL SZ 6 (GLOVE) ×4 IMPLANT
GLOVE INDICATOR 6.5 STRL GRN (GLOVE) ×4 IMPLANT
GOWN STRL REUS W/ TWL LRG LVL3 (GOWN DISPOSABLE) ×3 IMPLANT
GOWN STRL REUS W/TWL LRG LVL3 (GOWN DISPOSABLE) ×6
KIT BASIN OR (CUSTOM PROCEDURE TRAY) ×2 IMPLANT
KIT TURNOVER KIT B (KITS) ×2 IMPLANT
NS IRRIG 1000ML POUR BTL (IV SOLUTION) ×2 IMPLANT
PAD ARMBOARD 7.5X6 YLW CONV (MISCELLANEOUS) ×2 IMPLANT
SET EXT 12IN DIALYSIS STAY-SAF (MISCELLANEOUS) ×3 IMPLANT
SET IRRIG TUBING LAPAROSCOPIC (IRRIGATION / IRRIGATOR) IMPLANT
SLEEVE ENDOPATH XCEL 5M (ENDOMECHANICALS) ×2 IMPLANT
STYLET FALLER (MISCELLANEOUS) ×2 IMPLANT
SUT MNCRL AB 4-0 PS2 18 (SUTURE) ×2 IMPLANT
SUT SILK 0 TIES 10X30 (SUTURE) ×2 IMPLANT
TOWEL OR 17X24 6PK STRL BLUE (TOWEL DISPOSABLE) ×2 IMPLANT
TOWEL OR 17X26 10 PK STRL BLUE (TOWEL DISPOSABLE) ×1 IMPLANT
TRAY LAPAROSCOPIC MC (CUSTOM PROCEDURE TRAY) ×2 IMPLANT
TROCAR 5MMX150MM (TROCAR) ×1 IMPLANT
TROCAR XCEL NON-BLD 5MMX100MML (ENDOMECHANICALS) ×2 IMPLANT
TUBING CYSTO DISP (UROLOGICAL SUPPLIES) ×2 IMPLANT
TUBING INSUFFLATION (TUBING) ×2 IMPLANT
WATER STERILE IRR 1000ML POUR (IV SOLUTION) ×2 IMPLANT

## 2018-03-11 NOTE — Op Note (Signed)
Operative Note  Jacob [name] [id number]  03/11/2018   Surgeon: Clovis Riley MD   Assistant: none   Procedure performed: laparoscopic insertion of tunneled peritoneal dialysis catheter, laparoscopic omentopexy   Preop diagnosis: ESRD Post-op diagnosis/intraop findings: same.   Specimens: none Retained items: CAPD catheter  EBL: minimal cc Complications: none   Description of procedure: After obtaining informed consent the patient was taken to the operating room and placed supine on operating room table where general endotracheal anesthesia was initiated, preoperative antibiotics were administered, SCDs applied, and a formal timeout was performed. The abdomen was prepped and draped in the usual sterile fashion. Peritoneal access was gained using an Optiview technique in the left upper quadrant. Insufflation to 15 mmHg ensued without incident and gross inspection revealed no evidence of injury from entry. There is a tiny umbilical hernia containing preperitoneal fat as well as small inguinal hernia on the left. Under direct visualization and after infiltration with local, an additional 5 mm trocar was placed in the left hemiabdomen. A small stab incision was made in the right upper quadrant and the laparoscopic suture passer was used with 0 Silk to pexy the omentum to the right upper quadrant abdominal wall. The pelvis was inspected. No other adhesions or other abnormalities are present. At this juncture an incision was made just to the left of the umbilicus and the long 5 mm trocar was used and inserted at a 45 angle, tunneling the posterior rectus and then preperitoneal space a fair distance before entering the abdominal cavity. The abdominal portion of the catheter was inserted over a stylette with the radiopaque stripe directed anteriorly. The cuff was situated within the rectus muscle. The stylet and trocar were then removed. We then brought the subcutaneous portion of the catheter onto the  field and trimmed to suit the abdominal wall. A subcostal incision was made in the epigastrium. The subcutaneous and intra-abdominal component of the catheter were connected to each other with the radioopaque stripes matched using the titanium adapter and then secured with 2-0 silk ties. Using the tunneler, the catheter was then tunneled up through the subcutaneous tissues of the abdominal wall to exit in the subcostal incision and then brought back down through the subcutaneous tissues to exit in the left upper hemiabdomen. The external titanium adapter was then applied. Intra-abdominal inspection was repeated and the catheter was noted to sit in the pelvis without any kinking or tension. Hemostasis was confirmed. The cuff was confirmed to be in the abdominal wall with no exposure to the peritoneal cavity. At this juncture the abdomen was desufflated. Cystoscopy tubing was connected to the catheter and the abdominal cavity was instilled with a liter of sterile saline. This went in easily. The bag was then placed to gravity and we drained out about 734mL of clear fluid. The catheter was flushed with heparinized saline. The cystoscopy tubing was then disconnected and the external tubing connected. All skin incisions were closed with subcuticular Monocryl and Dermabond. No sutures were placed around the catheter itself. The exit site and catheter were covered with a bulky gauze dressing and tegaderm. The patient was then awakened, extubated and taken to PACU in stable condition.    All counts were correct at the completion of the case.

## 2018-03-11 NOTE — Discharge Instructions (Signed)
PERITONEAL DIALYSIS (CAPD) CATHETER PLACEMENT: ° °POST OPERATIVE INSTRUCTIONS ° °FOLLOW UP with the Peritoneal Dialysis Nurses ° [address] [place], [place] [address] ° °Call [phone] or your neprologist to help arrange training/flushes of your CAPD catheter °  °The CAPD nurses & Nephrology usually follow you closely, making the need for follow-up in the [facility] surgery office redundant and therefore not always needed.  If they or you have concerns, please call us for possible follow-up in our office ° °1. Do NOT shower until dialysis nursing staff have advised.  Do NOT submerge in a bathtub or hot tub. °2. Your Home Therapy RN will advise and educate you on showering, bathing, and swimming when you are in training. °3. The Peritoneal Dialysis nurse will remove your waterproof bandages in the Dialysis Center a few days after surgery.  Do not remove the bandages until seen by them.  If you dressing becomes wet, saturated, or falls off call your Home Therapy RN. °4. ACTIVITIES as tolerated:   °a. You may resume regular (light) daily activities beginning the next day--such as daily self-care, walking, climbing stairs--gradually increasing activities as tolerated.  If you can walk 30 minutes without difficulty, it is safe to try more intense activity such as jogging, treadmill, bicycling, low-impact aerobics,  etc. °b. No swimming within the 1st month of catheter placement.  You must have a Dr's order. °c. Save the most intensive and strenuous activity for last such as sit-ups, heavy lifting, contact sports, etc  Refrain from any heavy lifting or straining until you are off narcotics for pain control.   °d. DO NOT PUSH THROUGH PAIN.  Let pain be your guide: If it hurts to do something, don't do it.  Pain is your body warning you to avoid that activity for another week until the pain goes down. °e. You may drive when you are no longer taking prescription pain medication, you can comfortably wear a seatbelt, and you can  safely maneuver your car and apply brakes. °f. You may have sexual intercourse when it is comfortable.  °g. Be sure your catheter is taped to Your abdomen nor injured. Did not allow catheter to ankle, or tension on the catheter-it may cause damage to skin over the catheter °h. You will be instructed on what to do an emergency, and will have access to on call our in 24 hours a day. The number to reach the home therapy nurse as listed below. °5. DIET: Follow a light bland diet the first 24 hours after arrival home, such as soup, liquids, crackers, etc.  Be sure to include lots of fluids daily.  Avoid fast food or heavy meals as your are more likely to get nauseated.   °6. Take your usually prescribed home medications unless otherwise directed. °7. PAIN CONTROL: °a. Pain is best controlled by a usual combination of three different methods TOGETHER: °i. Ice/Heat °ii. Tylenol (over the counter pain medication) °iii. Prescription pain medication °b. Most patients will experience some swelling and bruising around the incisions.  Ice packs or heating pads (30-60 minutes up to 6 times a day) will help. Use ice for the first few days to help decrease swelling and bruising, then switch to heat to help relax tight/sore spots and speed recovery.  Some people prefer to use ice alone, heat alone, alternating between ice & heat.  Experiment to what works for you.  Swelling and bruising can take several weeks to resolve.   °c. It is helpful to take   an over-the-counter pain medication regularly for the first few weeks.  Using acetaminophen (Tylenol, etc) 500-650mg four times a day (every meal & bedtime) is usually safest since NSAIDs are not advisable in patients with kidney disease. °d. A  prescription for pain medication (such as oxycodone, hydrocodone, etc) should be given to you upon discharge.  Take your pain medication as prescribed.  °i. If you are having problems/concerns with the prescription medicine (does not control pain,  nausea, vomiting, rash, itching, etc), please call us [phone] to see if we need to switch you to a different pain medicine that will work better for you and/or control your side effect better. °ii. If you need a refill on your pain medication, please contact your pharmacy.  They will contact our office to request authorization. Prescriptions will not be filled after 5 pm or on week-ends. °8. Avoid getting constipated.  Between the surgery and the pain medications, it is common to experience some constipation.  Increasing fluid intake and taking a fiber supplement (such as Metamucil, Citrucel, FiberCon, MiraLax, etc) 1-2 times a day regularly will usually help prevent this problem from occurring.  A mild laxative (prune juice, Milk of Magnesia, MiraLax, etc) should be taken according to package directions if there are no bowel movements after 48 hours.  ° ° ° ° ° °FOLLOW UP: °Peritoneal Dialysis nurses ° [address] [place], [place] [address] ° °Call [phone] or your neprologist to help arrange training/flushes of your CAPD catheter °  ° -The CAPD nurses & Nephrology usually follow you closely, making the need for follow-up in our office redundant and therefore not needed.  If they or you have concerns, please call us for possible follow-up in our office ° ° -Please call [facility] at [phone] only as needed. ° °WHEN TO CALL US [phone]: °1. Poor pain control °2. Reactions / problems with new medications (rash/itching, nausea, etc)  °3. Fever over 101.5 F (38.5 C) °4. Worsening swelling or bruising °5. Continued bleeding from incision. °6. Increased pain, redness, or drainage from the incision ° ° The clinic staff is available to answer your questions during regular business hours (8:30am-5pm).  Please don’t hesitate to call and ask to speak to one of our nurses for clinical concerns.  ° If you have a medical emergency, go to the nearest emergency room or call 911. ° A surgeon from [facility] is always on call at the hospitals ° °9. IF YOU HAVE DISABILITY OR FAMILY LEAVE FORMS, BRING THEM TO THE OFFICE FOR PROCESSING.  DO NOT GIVE THEM TO YOUR DOCTOR. ° °[facility] °[address], [address], [place], [place]  [address] ? °MAIN: [phone] ? TOLL FREE: [phone] ?  °FAX [phone] °[email or web address] ° °Peritoneal Dialysis - An Overview °Dialysis can be done using a machine outside of the body (hemodialysis). Or, it can be done inside the body (peritoneal dialysis). The word "peritoneal" refers to the lining or membrane of the belly (abdominal cavity). The peritoneal membrane is a thin, plastic-like lining inside the belly that covers the organs and fits in the abdominal or peritoneal cavity, such as the stomach, liver and the kidneys. This lining works like a filter. It will allow certain things to pass from your blood through the lining and into a special solution that has been placed into your belly. In this type of dialysis, the peritoneum is used to help clean the blood.  °If you need dialysis, your   kidneys are not working right. Healthy kidneys take out extra water and waste products, which becomes urine. When the kidneys do not do this, serious problems can develop. The waste and water build up in the blood. Your hands and feet might swell. You may feel tired, weak or sick to your stomach. Also, your blood pressure may rise. If not treated, you could die. Dialysis is a treatment that does the work that your kidneys would do if they were healthy. °· It cleans your blood.  °· It will make sure your body has the right amount of certain chemicals that it needs. They include potassium, sodium and bicarbonate.  °· It will help control your blood pressure.  °UNDERSTANDING PERITONEAL DIALYSIS °· Here is how peritoneal dialysis works:  °· First, you will have surgery to put a soft plastic tube (catheter) into your belly (abdomen). This will allow you  to easily connect yourself to special tubing, which will then let a special dialysis solution to be placed into your abdomen.  °· For each treatment, you will need at least one bag of dialysis solution (a liquid called dialysate). It is a mix of water that is pure and free of germs (sterile), sugar (dextrose) and the nutrients and minerals found in your blood. Sometimes, more than one bag is needed to get the right amount of fluid for your abdomen. Your caregiver will explain what size and how many bags you will need.  °· The dialysate is slowly put through the catheter to fill the abdomen (called the peritoneal cavity). This dialysate will need to stay in your body for 3-4 hours. This is known as the dwell time.  °· The solution is working to clean the blood and remove wastes from your body. At the end of this time, the solution is drained from your body through tubing into an empty bag. It is then replaced with a fresh dialysate.  °· The draining and replacing of the dialysate is called an exchange or cycle. The catheter is capped after each exchange. Once the solution is in your body, you are then free to do whatever you would like until the next exchange. Most people will need to do 4-5 exchanges each day.  °· There are two different methods that can be used.  °· Continuous ambulatory peritoneal dialysis (CAPD): You put the solution into your abdomen, cap your catheter and then go about your day. Several hours later, you reconnect to a tubing set up, drain out the solution and then put more solution in. This is done several times a day. No machine is needed.  °· Continuous cycler-assisted peritoneal dialysis (CCPD): A machine is used, which fills the abdomen with dialysate and then drains it. This happens several times. It usually is done at night while you are sleeping. When you wake up, you can disconnect from the machine and are free to go to go about your day.  °PREPARING FOR EXCHANGES °· Discuss the details  of the procedure with your caregivers. You will be working with a nurse who is specially trained in doing dialysis. Make sure you understand:  °· How to do an exchange.  °· How much solution you need.  °· What type of solution you will need.  °· How often you should do an exchange. Ask:  °· How many times each day?  °· When? At meals? At bedtime?  °· Always keep the dialysate bags and other supplies in a cool, clean and dry place.  °·   Keeping everything clean is very important.  °· The catheter and its cap must be free from germs (sterile)  °· The adapter also must be sterile. It attaches the dialysis bag and tubing to the catheter.  °· Clean the area of your body around the catheter every day. Use a chemical that fights infection (antiseptic).  °· Wash your hands thoroughly before starting an exchange.  °· You may be taught to wear a mask to cover your nose and mouth. This makes infection less likely to happen.  °· You may be taught to close doors, windows and turn off any fans before doing an exchange.  °· Check the dialysate bag very carefully.  °· Make sure it is the right size bag for you. This information is on the label.  °· Also, make sure it is the right mixture. For some people, the dialysate contents vary. For instance, the mixture might be a stronger solution for overnight.  °· Check the expiration date (the last date you can use the bag). It also is on the label. If the date has gone by, throw away the bag.  °· The solution should be clear. You should be able to see any writing on the side of the bag clearly through the solution. Do not use a cloudy solution.  °· Gently squeeze the bag to make sure there are no leaks.  °· Use a dry heating pad to warm the dialysate in the bag. Leave the cover on the bag while you do this.  °· This is for comfort. You can skip this step if you want.  °· Never place the bag of solution under warm or hot water. Water from a faucet is not sterile and could cause germs to  get into the bag. Infection could then result.  °PERFORMING AN EXCHANGE °· For continuous ambulatory dialysis:  °· Attach the dialysis bag and tubing to your catheter. Hang the bag so that gravity (the natural downward pull) draws the solution down and into your abdomen once the clamps are opened. This should take about 10 minutes.  °· Remove the bag and tubing from the catheter. Cap the catheter.  °· The solution stays in the abdomen for 3-4 hours (dwell time). The solution is working to clean the blood and remove wastes from your body.  °· When you are ready to drain the solution for another exchange, take the cap off the catheter. Then, attach the catheter to tubing, which is attached to an empty bag. Place this empty bag below the abdomen or on the floor or stool and undo the clamps.  °· Gravity helps pull the fluid out of the abdomen and into the bag. The fluid in the bag may look yellow and clear, like urine. It usually takes about 20 minutes to drain the fluid out of the abdomen.  °· When the solution has drained, start the process again by infusing a new bag of dialysate and then capping the catheter.  °· This should continue until you have used all of the solution that you are to use each day.  °· Sometimes, a small machine is used overnight. It is called a mini-cycler. This is done if the body cannot go all night without an exchange. The machine lets you sleep without having to get up and do an exchange.  °· For continuous cycler-assisted dialysis:  °· You will be taught how to set up or program your machine.  °· When you are ready for bed,   put the dialysate bags onto the cycler machine. Put on exactly the number of bags that your caregiver said to use.   Connect your catheter to the machine and turn the cycler machine on.   Overnight, the cycler will do several exchanges. It often does three to five, sometimes more.   Solution that is in your abdomen in the morning will stay during the day. The  machine is set to make the daytime solution stronger, if that is needed.   In the morning, you will disconnect from the machine and cap your catheter and go about your day.   Sometimes, an extra exchange is done during the day. This may be needed to remove excess waste or fluid.  IMPORTANT REMINDERS  You will need to follow a very strict schedule. Every step of the dialysis procedure must be done every day. Sometimes, several times a day. Altogether, this might take an extra 2 hours or more. However, you must stick to the routine. Do not skip a day. Do not skip a procedure.   Some people find it helpful to work with a Social worker or Education officer, museum in addition to the renal (kidney) nurse. They can help you figure out how to change your daily routine to fit in the dialysis sessions.   You may need to change your diet. Ask your caregiver for advice, or talk with a nutritionist about what you should and should not eat.   You will need to weigh yourself every day and keep track of what your weight is.   You may be taught how to check your blood pressure before every exchange. Your blood pressure reading will help determine what type of solution to use. If your blood pressure is too high, you may need a stronger solution.  RISKS AND COMPLICATIONS  Possible problems vary, depending on the method you use. Your overall health also can have an effect. Problems that could develop because of dialysis include:  Infection. This is the most common problem. It could occur:   In the peritoneum. This is called peritonitis.   Around the catheter.   Weight gain. The dialysate contains a type of sugar known as dextrose. Dextrose has a lot of calories. The body takes in several hundred calories from this sugar each day.   Weakened muscles in the abdomen. This can result from all of the fluid that your body has to hold in the abdomen.   Catheter replacement. Sometimes, a new one has to be put in.   Change in  dialysis method. Due to some complications, you may need to change to hemodialysis for a short time and have your dialysis done at a center.   Trouble adjusting to your new lifestyle. In some people, this leads to depression.   Sleep problems.   Dialysis-related amyloidosis. This sometimes occurs after 5 years of dialysis. Protein builds up in the blood. This can cause painful deposits on bones, joints and tendons (which connect muscle to bone). Or, it can cause hollow spots in bones that make them more likely to break.   Excess fluid. Your body may absorb too much of the fluid that is held in the abdomen. This can lead to heart or lung problems.  SEEK MEDICAL CARE IF:   You have any problems with an exchange.   The area around the catheter becomes red or painful.   The catheter seems loose, or it feels like it is coming out.   A bag of dialysate looks  cloudy. Or, the liquid is an unusual color.   Abdominal pain or discomfort.   You feel sick to your stomach (nauseous) or throw up (vomit).   You develop a fever of more than 102 F (38.9 C).  SEEK IMMEDIATE MEDICAL CARE IF:  You develop a fever of more than 102 F (38.9 C). Document Released: 05/17/2009 Document Revised: 07/09/2011 Document Reviewed: 05/17/2009 [facility] Patient Information 2012 [place].  Diet for Peritoneal Dialysis This diet may be modified in protein, sodium, phosphorus, potassium, or fluid, depending on your needs. The goals of nutrition therapy are similar to those for patients on hemodialysis. Providing enough protein to replace peritoneal losses is a priority. USES OF THIS DIET The diet is designed for the patient with end-stage kidney (renal) disease, who is treated by peritoneal dialysis. Treatment options include:  Continuous Ambulatory Peritoneal Dialysis (CAPD): Usually 4 exchanges of 1.5 to 2 liter volumes of glucose (sugar) and electrolyte-containing dialysate.   Continuous Cyclic Peritoneal  Dialysis (CCPD): Essentially a reversal of CAPD, with shorter exchanges at night and a longer one during the day.   Intermittent Peritoneal Dialysis (IPD): 10 to 12 hours of exchanges, 2 to 3 times weekly.  ADEQUACY The diet may not meet the Recommended Dietary Allowances of the [employer] for calcium and ascorbic acid. Protein and water-soluble vitamin needs may be increased because of losses into the dialysate. Recommended daily supplements are the same as for hemodialysis patients. ASSESSMENT/DETERMINATION OF DIET Dietary needs will differ between patients. Parameters must be individualized. Protein  Guidelines: 1.2 to 1.3 gm/kg/day OR 1.5 gm/kg/day if patient is malnourished, catabolic, or has a protracted episode of peritonitis. A minimum of 50% of the protein intake should be of high biological value.   Goals: Meet protein requirements and replace dialysate losses while avoiding excessive accumulation of waste products. Achieve serum albumin greater than 3.5 g/dL.   Evaluate: Current nutritional status, serum albumin and BUN levels, presence of peritonitis.  Sodium  Guidelines: Usually 90 to 175 mEq (2000 to 4000 mg), but should be individualized.   Goals: Minimize complications of fluid imbalance.   Evaluate: Weight, blood pressure regulation, and presence of swelling (edema).  Potassium  Guidelines: Individualized; often not restricted, and may need to be supplemented.   Goals: Serum K+ levels between 4.0 to 5.0 mEq/L.   Evaluate: Serum K+ levels, usual intake of K+, appetite.  Phosphorus  Guidelines: 800 to 1200 mg/day (the high protein intake results in a high obligatory P intake).   Goal: Serum P levels between 4.5 to 6.0 mg/dL.   Evaluate: Serum P levels, usual P intake, P-binding medications: type, number, dosage, distribution.  Fluids  Guidelines: Individualized - may not be restricted for all patients.   Goal: Minimize complications of fluid  imbalance.   Evaluate: Weight, blood pressure regulation, sodium intake, and presence of edema.  Document Released: 07/20/2005 Document Revised: 07/09/2011 Document Reviewed: 10/12/2006 [facility] Patient Information 2012 [place].   Post Anesthesia Home Care Instructions  Activity: Get plenty of rest for the remainder of the day. A responsible individual must stay with you for 24 hours following the procedure.  For the next 24 hours, DO NOT: -Drive a car -Paediatric nurse -Drink alcoholic beverages -Take any medication unless instructed by your physician -Make any legal decisions or sign important papers.  Meals: Start with liquid foods such as gelatin or soup. Progress to regular foods as tolerated. Avoid greasy, spicy, heavy foods. If nausea and/or vomiting occur, drink only clear  liquids until the nausea and/or vomiting subsides. Call your physician if vomiting continues.  Special Instructions/Symptoms: Your throat may feel dry or sore from the anesthesia or the breathing tube placed in your throat during surgery. If this causes discomfort, gargle with warm salt water. The discomfort should disappear within 24 hours.  If you had a scopolamine patch placed behind your ear for the management of post- operative nausea and/or vomiting:  1. The medication in the patch is effective for 72 hours, after which it should be removed.  Wrap patch in a tissue and discard in the trash. Wash hands thoroughly with soap and water. 2. You may remove the patch earlier than 72 hours if you experience unpleasant side effects which may include dry mouth, dizziness or visual disturbances. 3. Avoid touching the patch. Wash your hands with soap and water after contact with the patch.

## 2018-03-11 NOTE — Anesthesia Postprocedure Evaluation (Signed)
Anesthesia Post Note  Patient: Jacob [name]  Procedure(s) Performed: LAPAROSCOPIC INSERTION OF PERIOTONEAL DIALYSIS CATHETER (N/A Abdomen)     Patient location during evaluation: PACU Anesthesia Type: General Level of consciousness: awake and alert Pain management: pain level controlled Vital Signs Assessment: post-procedure vital signs reviewed and stable Respiratory status: spontaneous breathing, nonlabored ventilation and respiratory function stable Cardiovascular status: blood pressure returned to baseline and stable Postop Assessment: no apparent nausea or vomiting Anesthetic complications: no    Last Vitals:  Vitals:   03/11/18 1059 03/11/18 1130  BP: (!) 136/53 (!) 131/57  Pulse: 66 66  Resp: 12 13  Temp:    SpO2: 98% 96%    Last Pain:  Vitals:   03/11/18 1130  PainSc: 0-No pain                 Audry Pili

## 2018-03-11 NOTE — Transfer of Care (Signed)
Immediate Anesthesia Transfer of Care Note  Patient: Jacob [name]  Procedure(s) Performed: LAPAROSCOPIC INSERTION OF PERIOTONEAL DIALYSIS CATHETER (N/A Abdomen)  Patient Location: PACU  Anesthesia Type:General  Level of Consciousness: patient cooperative and responds to stimulation  Airway & Oxygen Therapy: Patient Spontanous Breathing and Patient connected to face mask oxygen  Post-op Assessment: Report given to RN and Post -op Vital signs reviewed and stable  Post vital signs: Reviewed and stable  Last Vitals:  Vitals Value Taken Time  BP 166/51 03/11/2018 10:44 AM  Temp    Pulse 65 03/11/2018 10:44 AM  Resp 16 03/11/2018 10:44 AM  SpO2 98 % 03/11/2018 10:44 AM  Vitals shown include unvalidated device data.  Last Pain:  Vitals:   03/11/18 0722  PainSc: 0-No pain         Complications: No apparent anesthesia complications

## 2018-03-11 NOTE — H&P (Signed)
Surgical H&P  CC: ESRD  HPI: presents for lap insertion CAPD  Allergies  Allergen Reactions  . Bee Venom Anaphylaxis  . Iodine Swelling  . Penicillins Swelling and Other (See Comments)    SWELLING REACTION UNSPECIFIED   Has patient had a PCN reaction causing immediate rash, facial/tongue/throat swelling, SOB or lightheadedness with hypotension: No Has patient had a PCN reaction causing severe rash involving mucus membranes or skin necrosis: No Has patient had a PCN reaction that required hospitalization No Has patient had a PCN reaction occurring within the last 10 years: No If all of the above answers are "NO", then may proceed with Cephalosporin use.  [name] Plavix [Clopidogrel] Other (See Comments)    Caused Nose bleed  . Sulfadiazine Other (See Comments)    1% Silver Sulfadiazine cream causes burning over a large area of skin.    Past Medical History:  Diagnosis Date  . Anemia in chronic kidney disease(285.21)   . Anxiety   . Arthritis   . Depression   . ESRD (end stage renal disease) on dialysis [facility])    "MWF; [facility] [place]" (02/18/2017)  . Essential hypertension   . GERD (gastroesophageal reflux disease)   . Glaucoma   . History of blood transfusion   . History of stroke 2016  . Insomnia   . Nonischemic cardiomyopathy ([place])   . Stroke [facility])    mini stroke  . Type 2 diabetes mellitus ([place])     Past Surgical History:  Procedure Laterality Date  . A/V FISTULAGRAM Right 10/06/2016   Procedure: A/V Fistulagram;  Surgeon: [name], MD;  Location: [place] [facility];  Service: Cardiovascular;  Laterality: Right;  . A/V FISTULAGRAM Right 04/27/2017   Procedure: A/V Fistulagram;  Surgeon: [name], MD;  Location: [place] [facility];  Service: Cardiovascular;  Laterality: Right;  . A/V SHUNTOGRAM Right 11/17/2017   Procedure: A/V SHUNTOGRAM;  Surgeon: Waynetta Sandy, MD;  Location: [place] [facility];  Service: Cardiovascular;  Laterality: Right;  .  ABDOMINAL AORTOGRAM N/A 12/30/2016   Procedure: Abdominal Aortogram;  Surgeon: Waynetta Sandy, MD;  Location: [place] [facility];  Service: Cardiovascular;  Laterality: N/A;  . ABDOMINAL AORTOGRAM N/A 04/08/2017   Procedure: ABDOMINAL AORTOGRAM;  Surgeon: Conrad Radnor, MD;  Location: [place] [facility];  Service: Cardiovascular;  Laterality: N/A;  . ABDOMINAL AORTOGRAM W/LOWER EXTREMITY N/A 02/23/2017   Procedure: Abdominal Aortogram w/Lower Extremity;  Surgeon: [name], MD;  Location: [place] [facility];  Service: Cardiovascular;  Laterality: N/A;  Rt. leg  . AMPUTATION Right 02/28/2017   Procedure: RIGHT BELOW KNEE AMPUTATION;  Surgeon: [name], MD;  Location: [place];  Service: Vascular;  Laterality: Right;  . AV FISTULA PLACEMENT     Hx: of  . AV FISTULA PLACEMENT Right 05/04/2013   Procedure: ARTERIOVENOUS (AV) FISTULA CREATION- RIGHT ARM;  Surgeon: Conrad Gladstone, MD;  Location: [place];  Service: Vascular;  Laterality: Right;  Ultrasound guided  . AV FISTULA PLACEMENT Right 07/28/2016   Procedure: BRACHIOCEPHALIC ARTERIOVENOUS (AV) FISTULA CREATION;  Surgeon: [name], MD;  Location: [place];  Service: Vascular;  Laterality: Right;  . BACK SURGERY    . BELOW KNEE LEG AMPUTATION     1 PRIOR AMPUTATION ON FOOT  RT LEG/ FOOT  . CATARACT EXTRACTION W/PHACO Left 07/16/2014   Procedure: CATARACT EXTRACTION PHACO AND INTRAOCULAR LENS PLACEMENT (IOC);  Surgeon: [name], MD;  Location: [facility];  Service: Ophthalmology;  Laterality: Left;  CDE:8.86  . CATARACT EXTRACTION W/PHACO Right 07/30/2014   Procedure: CATARACT EXTRACTION PHACO AND INTRAOCULAR LENS PLACEMENT (IOC);  Surgeon: [name], MD;  Location: [facility];  Service: Ophthalmology;  Laterality: Right;  CDE 8.99  . COLONOSCOPY     Hx: of  . EYE SURGERY    . FISTULA SUPERFICIALIZATION Right 07/28/2016   Procedure: FISTULA SUPERFICIALIZATION;  Surgeon: [name], MD;  Location: [place];  Service:  Vascular;  Laterality: Right;  . FISTULA SUPERFICIALIZATION Right 10/13/2016   Procedure: BRACHIOCEPHALIC ARTERIOVENOUS FISTULA SUPERFICIALIZATION;  Surgeon: [name], MD;  Location: [place];  Service: Vascular;  Laterality: Right;  . FISTULOGRAM N/A 05/04/2013   Procedure: CENTRAL VENOGRAM;  Surgeon: Conrad [place], MD;  Location: [place];  Service: Vascular;  Laterality: N/A;  . INSERTION OF DIALYSIS CATHETER Right 05/04/2013   Procedure: INSERTION OF DIALYSIS CATHETER;  Surgeon: Conrad Keyes[place], MD;  Location: [place];  Service: Vascular;  Laterality: Right;  Ultrasound guided  . INSERTION OF DIALYSIS CATHETER N/A 07/28/2016   Procedure: INSERTION OF DIALYSIS CATHETER;  Surgeon: [name], MD;  Location: [place];  Service: Vascular;  Laterality: N/A;  . INSERTION OF DIALYSIS CATHETER Right 03/07/2018   Procedure: INSERTION OF DIALYSIS CATHETER;  Surgeon: Conrad Eleva, MD;  Location: [place];  Service: Vascular;  Laterality: Right;  . IR FLUORO GUIDE CV LINE RIGHT  04/13/2017  . IR THROMBECTOMY AV FISTULA W/THROMBOLYSIS/PTA/STENT INC/SHUNT/IMG RT Right 09/07/2017  . IR US GUIDE VASC ACCESS RIGHT  04/13/2017  . IR US GUIDE VASC ACCESS RIGHT  09/07/2017  . LIGATION OF ARTERIOVENOUS  FISTULA Left 05/04/2013   Procedure: LIGATION OF LEFT RADIAL CEPHALIC ARTERIOVENOUS  FISTULA;  Surgeon: Conrad [place], MD;  Location: [place];  Service: Vascular;  Laterality: Left;  Ultrasound guided  . LIGATION OF ARTERIOVENOUS  FISTULA Right 07/28/2016   Procedure: LIGATION OF RADIOCEPHALIC ARTERIOVENOUS  FISTULA;  Surgeon: [name], MD;  Location: [place];  Service: Vascular;  Laterality: Right;  . LOWER EXTREMITY ANGIOGRAPHY N/A 12/30/2016   Procedure: Lower Extremity Angiography;  Surgeon: Waynetta Sandy, MD;  Location: [place] [facility];  Service: Cardiovascular;  Laterality: N/A;  . LUMBAR SPINE SURGERY    . PERIPHERAL VASCULAR ATHERECTOMY Right 12/30/2016   Procedure: Peripheral Vascular  Atherectomy;  Surgeon: Waynetta Sandy, MD;  Location: [place] [facility];  Service: Cardiovascular;  Laterality: Right;  AT and PT  . PERIPHERAL VASCULAR BALLOON ANGIOPLASTY Right 12/30/2016   Procedure: Peripheral Vascular Balloon Angioplasty;  Surgeon: Waynetta Sandy, MD;  Location: [place] [facility];  Service: Cardiovascular;  Laterality: Right;  PTA of PT and AT  . PERIPHERAL VASCULAR BALLOON ANGIOPLASTY  02/23/2017   Procedure: Peripheral Vascular Balloon Angioplasty;  Surgeon: [name], MD;  Location: [place] [facility];  Service: Cardiovascular;;  RT. Anterior Tib.  [name] PERIPHERAL VASCULAR BALLOON ANGIOPLASTY  04/08/2017   Procedure: PERIPHERAL VASCULAR BALLOON ANGIOPLASTY;  Surgeon: Conrad Republic, MD;  Location: [place] [facility];  Service: Cardiovascular;;  . PERIPHERAL VASCULAR CATHETERIZATION N/A 01/24/2015   Procedure: Fistulagram;  Surgeon: Conrad [place], MD;  Location: [place] [facility];  Service: Cardiovascular;  Laterality: N/A;  . PERIPHERAL VASCULAR CATHETERIZATION Right 06/04/2016   Procedure: A/V Shuntogram/Fistulagram;  Surgeon: Conrad Conway, MD;  Location: [place] [facility];  Service: Cardiovascular;  Laterality: Right;  . REVISON OF ARTERIOVENOUS FISTULA Right 01/02/2014   Procedure: REVISON OF ARTERIOVENOUS FISTULA ANASTOMOSIS;  Surgeon: Conrad [place], MD;  Location: [place];  Service: Vascular;  Laterality: Right;  . REVISON OF ARTERIOVENOUS FISTULA Right 01/02/2016   Procedure: REVISION OF RADIOCEPHALIC ARTERIOVENOUS FISTULA  with BOVINE PATCH ANGIOPLASTY RIGHT RADIAL ARTERY;  Surgeon: Mal [name], MD;  Location: [place];  Service: Vascular;  Laterality: Right;  . SHUNTOGRAM N/A 11/07/2013   Procedure: Fistulogram;  Surgeon: [name], MD;  Location: [facility] CATH LAB;  Service: Cardiovascular;  Laterality: N/A;  . THROMBECTOMY AND REVISION OF ARTERIOVENTOUS (AV) GORETEX  GRAFT Right 01/20/2018   Procedure: THROMBECTOMY  OF ARTERIOVENTOUS (AV)  HERO GRAFT;   Surgeon: [name], MD;  Location: [place];  Service: Vascular;  Laterality: Right;  . THROMBECTOMY W/ EMBOLECTOMY Right 07/18/2017   Procedure: THROMBECTOMY ARTERIOVENOUS HERO GRAFT ARM;  Surgeon: [name], MD;  Location: [place];  Service: Vascular;  Laterality: Right;  . THROMBECTOMY W/ EMBOLECTOMY Right 03/07/2018   Procedure: THROMBECTOMY OF HERO GRAFT; POSSIBLE REMOVAL;  Surgeon: Conrad Kerrick, MD;  Location: [place];  Service: Vascular;  Laterality: Right;  . TRANSMETATARSAL AMPUTATION Right 01/01/2017   Procedure: TRANSMETATARSAL AMPUTATION;  Surgeon: [name], MD;  Location: [place];  Service: Vascular;  Laterality: Right;  [name] VASCULAR ACCESS DEVICE INSERTION Right 05/13/2017   Procedure: INSERTION OF HERO VASCULAR ACCESS DEVICE RIGHT UPPER ARM;  Surgeon: Waynetta Sandy, MD;  Location: [place];  Service: Vascular;  Laterality: Right;  . VENOGRAM N/A 05/13/2017   Procedure: CENTRAL VENOGRAM;  Surgeon: Waynetta Sandy, MD;  Location: [facility] OR;  Service: Vascular;  Laterality: N/A;    Family History  Problem Relation Age of Onset  . Heart attack Brother        15  . Heart disease Brother        before age 3  . Hypertension Brother   . Heart attack Brother        47  . Heart attack Brother        8  . Diabetes Mother   . Hypertension Mother   . Heart disease Mother   . Heart disease Father   . Hypertension Father   . Other Father        amputation  . Hypertension Sister   . Heart disease Sister   . Vision loss Maternal Uncle     Social History   Socioeconomic History  . Marital status: Divorced    Spouse name: Not on file  . Number of children: Not on file  . Years of education: Not on file  . Highest education level: Not on file  Occupational History  . Not on file  Social Needs  . Financial resource strain: Not on file  . Food insecurity:    Worry: Not on file    Inability: Not on file  . Transportation needs:    Medical: Not  on file    Non-medical: Not on file  Tobacco Use  . Smoking status: Never Smoker  . Smokeless tobacco: Never Used  Substance and Sexual Activity  . Alcohol use: Yes    Comment: 1- fifth of gin a week  . Drug use: No  . Sexual activity: Yes    Birth control/protection: None  Lifestyle  . Physical activity:    Days per week: Not on file    Minutes per session: Not on file  . Stress: Not on file  Relationships  . Social connections:    Talks on phone: Not on file  Gets together: Not on file    Attends religious service: Not on file    Active member of club or organization: Not on file    Attends meetings of clubs or organizations: Not on file    Relationship status: Not on file  Other Topics Concern  . Not on file  Social History Narrative  . Not on file    No current facility-administered medications on file prior to encounter.    Current Outpatient Medications on File Prior to Encounter  Medication Sig Dispense Refill  . acetaminophen (TYLENOL) 325 MG tablet Take 650 mg by mouth every 6 (six) hours as needed for mild pain. Do not exceed 4 gms of tylenol in 24 hours    . B Complex-C-Folic Acid (DIALYVITE PO) Take 1 tablet by mouth every Monday, Wednesday, and Friday.    . calcium acetate (PHOSLO) 667 MG capsule Take 667-2,001 mg by mouth See admin instructions. Take 3 capsules (2001mg ) three times daily with a meal and 1 capsule (667mg ) with a snack.    . carvedilol (COREG) 12.5 MG tablet Take 1 tablet (12.5 mg total) by mouth See admin instructions. Take 1 tablet (12.5 mg) by mouth twice daily on Sunday, Tuesday, Thursday, Saturday (hold on dialysis days)    . cinacalcet (SENSIPAR) 30 MG tablet Take 30 mg by mouth daily with breakfast.     . diclofenac sodium (VOLTAREN) 1 % GEL Apply 2 g topically 4 (four) times daily as needed (pain).   1  . LANTUS SOLOSTAR 100 UNIT/ML Solostar Pen Inject 15 Units into the skin at bedtime. 15 mL 0  . omeprazole (PRILOSEC) 40 MG capsule Take  40 mg by mouth daily.    . ramipril (ALTACE) 10 MG capsule Take 10 mg by mouth 2 (two) times daily.    [name] EPINEPHrine (EPIPEN 2-PAK) 0.3 mg/0.3 mL IJ SOAJ injection Inject 0.3 mg into the muscle once as needed (anaphylaxis).      Review of Systems: a complete, 10pt review of systems was completed with pertinent positives and negatives as documented in the HPI  Physical Exam: Vitals:   03/11/18 0658  BP: 136/76  Pulse: 76  Resp: 16  Temp: 98.3 F (36.8 C)  SpO2: 98%   Alert and oriented Abdomen soft   CBC Latest Ref Rng & Units 03/11/2018 03/07/2018 01/20/2018  WBC 4.0 - 10.5 K/uL - 5.7 -  Hemoglobin 13.0 - 17.0 g/dL 14.3 14.0 15.0  Hematocrit 39.0 - 52.0 % 42.0 44.3 44.0  Platelets 150 - 400 K/uL - 231 -    CMP Latest Ref Rng & Units 03/11/2018 03/07/2018 01/20/2018  Glucose 70 - 99 mg/dL 115(H) 187(H) 125(H)  BUN 8 - 23 mg/dL - 63(H) -  Creatinine 0.61 - 1.24 mg/dL - 13.55(H) -  Sodium 135 - 145 mmol/L 139 136 135  Potassium 3.5 - 5.1 mmol/L 4.2 5.1 5.0  Chloride 98 - 111 mmol/L - 94(L) -  CO2 22 - 32 mmol/L - 24 -  Calcium 8.9 - 10.3 mg/dL - 8.6(L) -  Total Protein 6.5 - 8.1 g/dL - - -  Total Bilirubin 0.3 - 1.2 mg/dL - - -  Alkaline Phos 38 - 126 U/L - - -  AST 15 - 41 U/L - - -  ALT 17 - 63 U/L - - -    Lab Results  Component Value Date   INR 1.40 09/07/2017   INR 1.35 07/18/2017   INR 1.35 05/13/2017    Imaging: No results found.  A/P: To OR for lap insertion CAPD   Romana Juniper, MD [facility], [place] Pager [phone]

## 2018-03-11 NOTE — Anesthesia Preprocedure Evaluation (Addendum)
Anesthesia Evaluation  Patient identified by MRN, date of birth, ID band Patient awake    Reviewed: Allergy & Precautions, NPO status , Patient's Chart, lab work & pertinent test results  History of Anesthesia Complications Negative for: history of anesthetic complications  Airway Mallampati: III  TM Distance: >3 FB Neck ROM: Full    Dental  (+) Dental Advisory Given, Partial Upper   Pulmonary neg pulmonary ROS,    breath sounds clear to auscultation       Cardiovascular hypertension, Pt. on medications and Pt. on home beta blockers (-) angina+ Peripheral Vascular Disease   Rhythm:Regular Rate:Normal     Neuro/Psych PSYCHIATRIC DISORDERS Anxiety Depression  Insomnia CVA, No Residual Symptoms    GI/Hepatic Neg liver ROS, GERD  Medicated and Controlled,  Endo/Other  diabetes, Type 2, Insulin Dependent Obesity   Renal/GU ESRF and DialysisRenal disease  negative genitourinary   Musculoskeletal  (+) Arthritis ,   Abdominal   Peds  Hematology  (+) anemia ,   Anesthesia Other Findings   Reproductive/Obstetrics                            Anesthesia Physical Anesthesia Plan  ASA: III  Anesthesia Plan: General   Post-op Pain Management:    Induction: Intravenous  PONV Risk Score and Plan: 3 and Treatment may vary due to age or medical condition, Ondansetron and Dexamethasone  Airway Management Planned: Oral ETT  Additional Equipment: None  Intra-op Plan:   Post-operative Plan: Extubation in OR  Informed Consent: I have reviewed the patients History and Physical, chart, labs and discussed the procedure including the risks, benefits and alternatives for the proposed anesthesia with the patient or authorized representative who has indicated his/her understanding and acceptance.   Dental advisory given  Plan Discussed with: CRNA and Anesthesiologist  Anesthesia Plan Comments:         Anesthesia Quick Evaluation

## 2018-03-11 NOTE — Anesthesia Procedure Notes (Signed)
Procedure Name: Intubation Date/Time: 03/11/2018 9:42 AM Performed by: Glynda Jaeger, CRNA Pre-anesthesia Checklist: Patient identified, Patient being monitored, Timeout performed, Emergency Drugs available and Suction available Patient Re-evaluated:Patient Re-evaluated prior to induction Oxygen Delivery Method: Circle System Utilized Preoxygenation: Pre-oxygenation with 100% oxygen Induction Type: IV induction Ventilation: Mask ventilation without difficulty Laryngoscope Size: Mac and 4 Grade View: Grade I Tube type: Oral Tube size: 7.5 mm Number of attempts: 1 Airway Equipment and Method: Stylet Placement Confirmation: ETT inserted through vocal cords under direct vision,  positive ETCO2 and breath sounds checked- equal and bilateral Secured at: 23 cm Tube secured with: Tape Dental Injury: Teeth and Oropharynx as per pre-operative assessment

## 2018-03-12 ENCOUNTER — Encounter (HOSPITAL_COMMUNITY): Payer: Self-pay | Admitting: Surgery

## 2018-03-12 DIAGNOSIS — N186 End stage renal disease: Secondary | ICD-10-CM | POA: Diagnosis not present

## 2018-03-12 DIAGNOSIS — D509 Iron deficiency anemia, unspecified: Secondary | ICD-10-CM | POA: Diagnosis not present

## 2018-03-12 DIAGNOSIS — Z992 Dependence on renal dialysis: Secondary | ICD-10-CM | POA: Diagnosis not present

## 2018-03-12 DIAGNOSIS — N2581 Secondary hyperparathyroidism of renal origin: Secondary | ICD-10-CM | POA: Diagnosis not present

## 2018-03-14 DIAGNOSIS — N186 End stage renal disease: Secondary | ICD-10-CM | POA: Diagnosis not present

## 2018-03-14 DIAGNOSIS — D509 Iron deficiency anemia, unspecified: Secondary | ICD-10-CM | POA: Diagnosis not present

## 2018-03-14 DIAGNOSIS — Z992 Dependence on renal dialysis: Secondary | ICD-10-CM | POA: Diagnosis not present

## 2018-03-14 DIAGNOSIS — N2581 Secondary hyperparathyroidism of renal origin: Secondary | ICD-10-CM | POA: Diagnosis not present

## 2018-03-16 DIAGNOSIS — N2581 Secondary hyperparathyroidism of renal origin: Secondary | ICD-10-CM | POA: Diagnosis not present

## 2018-03-16 DIAGNOSIS — Z992 Dependence on renal dialysis: Secondary | ICD-10-CM | POA: Diagnosis not present

## 2018-03-16 DIAGNOSIS — N186 End stage renal disease: Secondary | ICD-10-CM | POA: Diagnosis not present

## 2018-03-16 DIAGNOSIS — D509 Iron deficiency anemia, unspecified: Secondary | ICD-10-CM | POA: Diagnosis not present

## 2018-03-18 DIAGNOSIS — N186 End stage renal disease: Secondary | ICD-10-CM | POA: Diagnosis not present

## 2018-03-18 DIAGNOSIS — Z992 Dependence on renal dialysis: Secondary | ICD-10-CM | POA: Diagnosis not present

## 2018-03-18 DIAGNOSIS — D509 Iron deficiency anemia, unspecified: Secondary | ICD-10-CM | POA: Diagnosis not present

## 2018-03-18 DIAGNOSIS — N2581 Secondary hyperparathyroidism of renal origin: Secondary | ICD-10-CM | POA: Diagnosis not present

## 2018-03-21 DIAGNOSIS — D509 Iron deficiency anemia, unspecified: Secondary | ICD-10-CM | POA: Diagnosis not present

## 2018-03-21 DIAGNOSIS — N2581 Secondary hyperparathyroidism of renal origin: Secondary | ICD-10-CM | POA: Diagnosis not present

## 2018-03-21 DIAGNOSIS — Z992 Dependence on renal dialysis: Secondary | ICD-10-CM | POA: Diagnosis not present

## 2018-03-21 DIAGNOSIS — N186 End stage renal disease: Secondary | ICD-10-CM | POA: Diagnosis not present

## 2018-03-22 ENCOUNTER — Ambulatory Visit (INDEPENDENT_AMBULATORY_CARE_PROVIDER_SITE_OTHER): Payer: Self-pay | Admitting: Physician Assistant

## 2018-03-22 ENCOUNTER — Other Ambulatory Visit: Payer: Self-pay

## 2018-03-22 VITALS — BP 134/78 | HR 84 | Temp 99.1°F | Resp 22 | Ht 74.0 in | Wt 268.0 lb

## 2018-03-22 DIAGNOSIS — N186 End stage renal disease: Secondary | ICD-10-CM

## 2018-03-22 NOTE — Progress Notes (Signed)
    Postoperative Access Visit   History of Present Illness   Jacob [name] is a 67 y.o. year old male who presents for postoperative follow-up for removal of central venous component of HeRO graft and exchange for R IJ TDC by Dr. Bridgett Larsson 03/07/18.  He has a documented SVC occlusion.  He is dialyzing without complication however from R IJ TDC.  He recently had PD catheter placed by Dr. Kae Heller and will begin PD next month. Patient says plan will be to continue dialysis via R IJ TDC until PD is initiated to avoid placement of thigh AVG.  Surgical history also significant for R BKA now ambulatory with prosthetic and walker.The patient is able to complete their activities of daily living.  He denies symptoms of arm swelling, neck or face swelling.  Physical Examination   Vitals:   03/22/18 1553  BP: 134/78  Pulse: 84  Resp: (!) 22  Temp: 99.1 F (37.3 C)  TempSrc: Oral  SpO2: 97%  Weight: 268 lb (121.6 kg)  Height: 6\' 2"  (1.88 m)   Body mass index is 34.41 kg/m.  right arm Incision of R neck and R should healed; R IJ TDC unremarkable; faint R radial pulse     Medical Decision Making   Jacob [name] is a 67 y.o. year old male who presents s/p removal of central venous component of HeRO graft and exchange for R IJ TDC by Dr. Bridgett Larsson 03/07/18   Incisions well healed  Plan will be to continue HD via R IJ TDC until PD is initiated  Follow up on an as needed basis   Dagoberto Ligas PA-C [facility] Office: [phone]

## 2018-03-23 DIAGNOSIS — Z992 Dependence on renal dialysis: Secondary | ICD-10-CM | POA: Diagnosis not present

## 2018-03-23 DIAGNOSIS — D509 Iron deficiency anemia, unspecified: Secondary | ICD-10-CM | POA: Diagnosis not present

## 2018-03-23 DIAGNOSIS — N2581 Secondary hyperparathyroidism of renal origin: Secondary | ICD-10-CM | POA: Diagnosis not present

## 2018-03-23 DIAGNOSIS — N186 End stage renal disease: Secondary | ICD-10-CM | POA: Diagnosis not present

## 2018-03-25 DIAGNOSIS — N2581 Secondary hyperparathyroidism of renal origin: Secondary | ICD-10-CM | POA: Diagnosis not present

## 2018-03-25 DIAGNOSIS — D509 Iron deficiency anemia, unspecified: Secondary | ICD-10-CM | POA: Diagnosis not present

## 2018-03-25 DIAGNOSIS — Z992 Dependence on renal dialysis: Secondary | ICD-10-CM | POA: Diagnosis not present

## 2018-03-25 DIAGNOSIS — N186 End stage renal disease: Secondary | ICD-10-CM | POA: Diagnosis not present

## 2018-03-28 DIAGNOSIS — D509 Iron deficiency anemia, unspecified: Secondary | ICD-10-CM | POA: Diagnosis not present

## 2018-03-28 DIAGNOSIS — Z992 Dependence on renal dialysis: Secondary | ICD-10-CM | POA: Diagnosis not present

## 2018-03-28 DIAGNOSIS — N2581 Secondary hyperparathyroidism of renal origin: Secondary | ICD-10-CM | POA: Diagnosis not present

## 2018-03-28 DIAGNOSIS — N186 End stage renal disease: Secondary | ICD-10-CM | POA: Diagnosis not present

## 2018-03-30 DIAGNOSIS — Z992 Dependence on renal dialysis: Secondary | ICD-10-CM | POA: Diagnosis not present

## 2018-03-30 DIAGNOSIS — N186 End stage renal disease: Secondary | ICD-10-CM | POA: Diagnosis not present

## 2018-03-30 DIAGNOSIS — N2581 Secondary hyperparathyroidism of renal origin: Secondary | ICD-10-CM | POA: Diagnosis not present

## 2018-03-30 DIAGNOSIS — D509 Iron deficiency anemia, unspecified: Secondary | ICD-10-CM | POA: Diagnosis not present

## 2018-04-01 DIAGNOSIS — D509 Iron deficiency anemia, unspecified: Secondary | ICD-10-CM | POA: Diagnosis not present

## 2018-04-01 DIAGNOSIS — N186 End stage renal disease: Secondary | ICD-10-CM | POA: Diagnosis not present

## 2018-04-01 DIAGNOSIS — N2581 Secondary hyperparathyroidism of renal origin: Secondary | ICD-10-CM | POA: Diagnosis not present

## 2018-04-01 DIAGNOSIS — Z992 Dependence on renal dialysis: Secondary | ICD-10-CM | POA: Diagnosis not present

## 2018-04-04 DIAGNOSIS — N186 End stage renal disease: Secondary | ICD-10-CM | POA: Diagnosis not present

## 2018-04-04 DIAGNOSIS — N2581 Secondary hyperparathyroidism of renal origin: Secondary | ICD-10-CM | POA: Diagnosis not present

## 2018-04-04 DIAGNOSIS — Z992 Dependence on renal dialysis: Secondary | ICD-10-CM | POA: Diagnosis not present

## 2018-04-06 DIAGNOSIS — N186 End stage renal disease: Secondary | ICD-10-CM | POA: Diagnosis not present

## 2018-04-06 DIAGNOSIS — Z992 Dependence on renal dialysis: Secondary | ICD-10-CM | POA: Diagnosis not present

## 2018-04-06 DIAGNOSIS — N2581 Secondary hyperparathyroidism of renal origin: Secondary | ICD-10-CM | POA: Diagnosis not present

## 2018-04-08 DIAGNOSIS — N186 End stage renal disease: Secondary | ICD-10-CM | POA: Diagnosis not present

## 2018-04-08 DIAGNOSIS — Z992 Dependence on renal dialysis: Secondary | ICD-10-CM | POA: Diagnosis not present

## 2018-04-08 DIAGNOSIS — N2581 Secondary hyperparathyroidism of renal origin: Secondary | ICD-10-CM | POA: Diagnosis not present

## 2018-04-11 DIAGNOSIS — N2581 Secondary hyperparathyroidism of renal origin: Secondary | ICD-10-CM | POA: Diagnosis not present

## 2018-04-11 DIAGNOSIS — Z992 Dependence on renal dialysis: Secondary | ICD-10-CM | POA: Diagnosis not present

## 2018-04-11 DIAGNOSIS — N186 End stage renal disease: Secondary | ICD-10-CM | POA: Diagnosis not present

## 2018-04-13 DIAGNOSIS — N2581 Secondary hyperparathyroidism of renal origin: Secondary | ICD-10-CM | POA: Diagnosis not present

## 2018-04-13 DIAGNOSIS — N186 End stage renal disease: Secondary | ICD-10-CM | POA: Diagnosis not present

## 2018-04-13 DIAGNOSIS — Z992 Dependence on renal dialysis: Secondary | ICD-10-CM | POA: Diagnosis not present

## 2018-04-15 DIAGNOSIS — Z992 Dependence on renal dialysis: Secondary | ICD-10-CM | POA: Diagnosis not present

## 2018-04-15 DIAGNOSIS — N186 End stage renal disease: Secondary | ICD-10-CM | POA: Diagnosis not present

## 2018-04-15 DIAGNOSIS — N2581 Secondary hyperparathyroidism of renal origin: Secondary | ICD-10-CM | POA: Diagnosis not present

## 2018-04-18 DIAGNOSIS — N186 End stage renal disease: Secondary | ICD-10-CM | POA: Diagnosis not present

## 2018-04-18 DIAGNOSIS — N2581 Secondary hyperparathyroidism of renal origin: Secondary | ICD-10-CM | POA: Diagnosis not present

## 2018-04-18 DIAGNOSIS — Z992 Dependence on renal dialysis: Secondary | ICD-10-CM | POA: Diagnosis not present

## 2018-04-20 DIAGNOSIS — Z992 Dependence on renal dialysis: Secondary | ICD-10-CM | POA: Diagnosis not present

## 2018-04-20 DIAGNOSIS — N186 End stage renal disease: Secondary | ICD-10-CM | POA: Diagnosis not present

## 2018-04-20 DIAGNOSIS — N2581 Secondary hyperparathyroidism of renal origin: Secondary | ICD-10-CM | POA: Diagnosis not present

## 2018-04-22 DIAGNOSIS — N2581 Secondary hyperparathyroidism of renal origin: Secondary | ICD-10-CM | POA: Diagnosis not present

## 2018-04-22 DIAGNOSIS — Z992 Dependence on renal dialysis: Secondary | ICD-10-CM | POA: Diagnosis not present

## 2018-04-22 DIAGNOSIS — N186 End stage renal disease: Secondary | ICD-10-CM | POA: Diagnosis not present

## 2018-04-25 DIAGNOSIS — N186 End stage renal disease: Secondary | ICD-10-CM | POA: Diagnosis not present

## 2018-04-25 DIAGNOSIS — Z992 Dependence on renal dialysis: Secondary | ICD-10-CM | POA: Diagnosis not present

## 2018-04-26 DIAGNOSIS — Z992 Dependence on renal dialysis: Secondary | ICD-10-CM | POA: Diagnosis not present

## 2018-04-26 DIAGNOSIS — N186 End stage renal disease: Secondary | ICD-10-CM | POA: Diagnosis not present

## 2018-04-27 DIAGNOSIS — Z23 Encounter for immunization: Secondary | ICD-10-CM | POA: Diagnosis not present

## 2018-04-27 DIAGNOSIS — Z992 Dependence on renal dialysis: Secondary | ICD-10-CM | POA: Diagnosis not present

## 2018-04-27 DIAGNOSIS — N186 End stage renal disease: Secondary | ICD-10-CM | POA: Diagnosis not present

## 2018-04-29 DIAGNOSIS — N2581 Secondary hyperparathyroidism of renal origin: Secondary | ICD-10-CM | POA: Diagnosis not present

## 2018-04-29 DIAGNOSIS — Z992 Dependence on renal dialysis: Secondary | ICD-10-CM | POA: Diagnosis not present

## 2018-04-29 DIAGNOSIS — N186 End stage renal disease: Secondary | ICD-10-CM | POA: Diagnosis not present

## 2018-05-02 DIAGNOSIS — N186 End stage renal disease: Secondary | ICD-10-CM | POA: Diagnosis not present

## 2018-05-02 DIAGNOSIS — N2581 Secondary hyperparathyroidism of renal origin: Secondary | ICD-10-CM | POA: Diagnosis not present

## 2018-05-02 DIAGNOSIS — Z992 Dependence on renal dialysis: Secondary | ICD-10-CM | POA: Diagnosis not present

## 2018-05-04 DIAGNOSIS — D509 Iron deficiency anemia, unspecified: Secondary | ICD-10-CM | POA: Diagnosis not present

## 2018-05-04 DIAGNOSIS — Z992 Dependence on renal dialysis: Secondary | ICD-10-CM | POA: Diagnosis not present

## 2018-05-04 DIAGNOSIS — N2581 Secondary hyperparathyroidism of renal origin: Secondary | ICD-10-CM | POA: Diagnosis not present

## 2018-05-04 DIAGNOSIS — N186 End stage renal disease: Secondary | ICD-10-CM | POA: Diagnosis not present

## 2018-05-06 DIAGNOSIS — N186 End stage renal disease: Secondary | ICD-10-CM | POA: Diagnosis not present

## 2018-05-06 DIAGNOSIS — N2581 Secondary hyperparathyroidism of renal origin: Secondary | ICD-10-CM | POA: Diagnosis not present

## 2018-05-06 DIAGNOSIS — Z992 Dependence on renal dialysis: Secondary | ICD-10-CM | POA: Diagnosis not present

## 2018-05-06 DIAGNOSIS — D509 Iron deficiency anemia, unspecified: Secondary | ICD-10-CM | POA: Diagnosis not present

## 2018-05-09 DIAGNOSIS — Z992 Dependence on renal dialysis: Secondary | ICD-10-CM | POA: Diagnosis not present

## 2018-05-09 DIAGNOSIS — D509 Iron deficiency anemia, unspecified: Secondary | ICD-10-CM | POA: Diagnosis not present

## 2018-05-09 DIAGNOSIS — N2581 Secondary hyperparathyroidism of renal origin: Secondary | ICD-10-CM | POA: Diagnosis not present

## 2018-05-09 DIAGNOSIS — N186 End stage renal disease: Secondary | ICD-10-CM | POA: Diagnosis not present

## 2018-05-11 DIAGNOSIS — N186 End stage renal disease: Secondary | ICD-10-CM | POA: Diagnosis not present

## 2018-05-11 DIAGNOSIS — D509 Iron deficiency anemia, unspecified: Secondary | ICD-10-CM | POA: Diagnosis not present

## 2018-05-11 DIAGNOSIS — N2581 Secondary hyperparathyroidism of renal origin: Secondary | ICD-10-CM | POA: Diagnosis not present

## 2018-05-11 DIAGNOSIS — Z992 Dependence on renal dialysis: Secondary | ICD-10-CM | POA: Diagnosis not present

## 2018-05-13 ENCOUNTER — Other Ambulatory Visit (HOSPITAL_COMMUNITY): Payer: Self-pay | Admitting: Nephrology

## 2018-05-13 DIAGNOSIS — Z992 Dependence on renal dialysis: Secondary | ICD-10-CM | POA: Diagnosis not present

## 2018-05-13 DIAGNOSIS — D509 Iron deficiency anemia, unspecified: Secondary | ICD-10-CM | POA: Diagnosis not present

## 2018-05-13 DIAGNOSIS — N186 End stage renal disease: Secondary | ICD-10-CM

## 2018-05-13 DIAGNOSIS — N2581 Secondary hyperparathyroidism of renal origin: Secondary | ICD-10-CM | POA: Diagnosis not present

## 2018-05-16 DIAGNOSIS — N2581 Secondary hyperparathyroidism of renal origin: Secondary | ICD-10-CM | POA: Diagnosis not present

## 2018-05-16 DIAGNOSIS — D509 Iron deficiency anemia, unspecified: Secondary | ICD-10-CM | POA: Diagnosis not present

## 2018-05-16 DIAGNOSIS — Z992 Dependence on renal dialysis: Secondary | ICD-10-CM | POA: Diagnosis not present

## 2018-05-16 DIAGNOSIS — N186 End stage renal disease: Secondary | ICD-10-CM | POA: Diagnosis not present

## 2018-05-18 ENCOUNTER — Other Ambulatory Visit: Payer: Self-pay | Admitting: Student

## 2018-05-18 DIAGNOSIS — N186 End stage renal disease: Secondary | ICD-10-CM | POA: Diagnosis not present

## 2018-05-18 DIAGNOSIS — D509 Iron deficiency anemia, unspecified: Secondary | ICD-10-CM | POA: Diagnosis not present

## 2018-05-18 DIAGNOSIS — N2581 Secondary hyperparathyroidism of renal origin: Secondary | ICD-10-CM | POA: Diagnosis not present

## 2018-05-18 DIAGNOSIS — Z992 Dependence on renal dialysis: Secondary | ICD-10-CM | POA: Diagnosis not present

## 2018-05-19 ENCOUNTER — Ambulatory Visit (HOSPITAL_COMMUNITY)
Admission: RE | Admit: 2018-05-19 | Discharge: 2018-05-19 | Disposition: A | Discharge status: Home / Self Care | Payer: Medicare Other | Source: Ambulatory Visit | Attending: Nephrology | Admitting: Nephrology

## 2018-05-19 ENCOUNTER — Other Ambulatory Visit: Payer: Self-pay

## 2018-05-19 ENCOUNTER — Encounter (HOSPITAL_COMMUNITY): Payer: Self-pay

## 2018-05-19 DIAGNOSIS — T8249XA Other complication of vascular dialysis catheter, initial encounter: Secondary | ICD-10-CM | POA: Diagnosis not present

## 2018-05-19 DIAGNOSIS — X58XXXA Exposure to other specified factors, initial encounter: Secondary | ICD-10-CM | POA: Diagnosis not present

## 2018-05-19 DIAGNOSIS — N186 End stage renal disease: Secondary | ICD-10-CM

## 2018-05-19 HISTORY — PX: IR FLUORO GUIDE CV LINE RIGHT: IMG2283

## 2018-05-19 MED ORDER — GELATIN ABSORBABLE 12-7 MM EX MISC
CUTANEOUS | Status: AC
  Filled 2018-05-19: qty 1

## 2018-05-19 MED ORDER — VANCOMYCIN HCL IN DEXTROSE 1-5 GM/200ML-% IV SOLN
INTRAVENOUS | Status: AC
  Filled 2018-05-19: qty 200

## 2018-05-19 MED ORDER — VANCOMYCIN HCL IN DEXTROSE 1-5 GM/200ML-% IV SOLN
1000.0000 mg | Freq: Once | INTRAVENOUS | Status: AC
  Administered 2018-05-19: 1000 mg via INTRAVENOUS

## 2018-05-19 MED ORDER — HEPARIN SODIUM (PORCINE) 1000 UNIT/ML IJ SOLN
INTRAMUSCULAR | Status: AC
  Filled 2018-05-19: qty 1

## 2018-05-19 MED ORDER — CHLORHEXIDINE GLUCONATE 4 % EX LIQD
CUTANEOUS | Status: AC
  Filled 2018-05-19: qty 15

## 2018-05-19 MED ORDER — LIDOCAINE HCL 1 % IJ SOLN
INTRAMUSCULAR | Status: AC | PRN
  Administered 2018-05-19: 10 mL

## 2018-05-19 MED ORDER — LIDOCAINE HCL 1 % IJ SOLN
INTRAMUSCULAR | Status: AC
  Filled 2018-05-19: qty 20

## 2018-05-19 NOTE — Procedures (Signed)
Interventional Radiology Procedure Note  Procedure: Exchange of right IJ tunneled HD catheter, 23cm tip to cuff.  Excellent aspiration confirmed.  Complications: None Recommendations:  - Ok to use - Do not submerge - Routine line care   Signed,  Dulcy Fanny. Earleen Newport, DO

## 2018-05-20 DIAGNOSIS — N2581 Secondary hyperparathyroidism of renal origin: Secondary | ICD-10-CM | POA: Diagnosis not present

## 2018-05-20 DIAGNOSIS — Z992 Dependence on renal dialysis: Secondary | ICD-10-CM | POA: Diagnosis not present

## 2018-05-20 DIAGNOSIS — N186 End stage renal disease: Secondary | ICD-10-CM | POA: Diagnosis not present

## 2018-05-20 DIAGNOSIS — D509 Iron deficiency anemia, unspecified: Secondary | ICD-10-CM | POA: Diagnosis not present

## 2018-05-23 DIAGNOSIS — Z992 Dependence on renal dialysis: Secondary | ICD-10-CM | POA: Diagnosis not present

## 2018-05-23 DIAGNOSIS — N186 End stage renal disease: Secondary | ICD-10-CM | POA: Diagnosis not present

## 2018-05-23 DIAGNOSIS — D509 Iron deficiency anemia, unspecified: Secondary | ICD-10-CM | POA: Diagnosis not present

## 2018-05-23 DIAGNOSIS — N2581 Secondary hyperparathyroidism of renal origin: Secondary | ICD-10-CM | POA: Diagnosis not present

## 2018-05-25 ENCOUNTER — Telehealth: Payer: Self-pay | Admitting: Surgery

## 2018-05-25 DIAGNOSIS — N186 End stage renal disease: Secondary | ICD-10-CM | POA: Diagnosis not present

## 2018-05-25 DIAGNOSIS — D509 Iron deficiency anemia, unspecified: Secondary | ICD-10-CM | POA: Diagnosis not present

## 2018-05-25 DIAGNOSIS — Z992 Dependence on renal dialysis: Secondary | ICD-10-CM | POA: Diagnosis not present

## 2018-05-25 DIAGNOSIS — N2581 Secondary hyperparathyroidism of renal origin: Secondary | ICD-10-CM | POA: Diagnosis not present

## 2018-05-25 NOTE — Telephone Encounter (Signed)
Patient status post laparoscopic insertion of peritoneal dialysis catheter 03/11/18. Apparently after this was done, the patient expressed concerns to the dialysis nurse that it was more than he can handle, previously counted on family support was not available, he doesn't like the catheter daily from his abdomen, he is tired of all of these things hanging out of his body etc. and he wants the catheter removed.    I have reviewed the multiple notes regarding his vascular access and it appears as though his current catheter is having issues with flow rates, was recently exchanged in IR, likely not going to benefit him much given his documented SVC occlusion, apparently next option is a thigh AVG.   I have spoken with the patient directly and I have also spoken directly to the dialysis nurse, Stanton Kidney, who has also called our office numerous times regarding this patient, and given his very limited vascular options, I STRONGLY recommended to both of them that he maintain the catheter as a safety measure,  and that the dialysis center needs to continue to work with him getting him more comfortable with the catheter, cleaning it, using it, etc. The patient is understandably frustrated by the general situation, but again it is in the interest of his safety that even if he does not use the PD catheter immediately, that he at least maintain it. Several times during our office visit today he mentions that he is fed up with everything and is starting to think there is no point. I did offer the patient referral to palliative medicine to work on discussing his goals and improving his quality of life but he declined.

## 2018-05-27 DIAGNOSIS — Z992 Dependence on renal dialysis: Secondary | ICD-10-CM | POA: Diagnosis not present

## 2018-05-27 DIAGNOSIS — N186 End stage renal disease: Secondary | ICD-10-CM | POA: Diagnosis not present

## 2018-05-27 DIAGNOSIS — D509 Iron deficiency anemia, unspecified: Secondary | ICD-10-CM | POA: Diagnosis not present

## 2018-05-27 DIAGNOSIS — N2581 Secondary hyperparathyroidism of renal origin: Secondary | ICD-10-CM | POA: Diagnosis not present

## 2018-05-30 DIAGNOSIS — N2581 Secondary hyperparathyroidism of renal origin: Secondary | ICD-10-CM | POA: Diagnosis not present

## 2018-05-30 DIAGNOSIS — Z992 Dependence on renal dialysis: Secondary | ICD-10-CM | POA: Diagnosis not present

## 2018-05-30 DIAGNOSIS — D509 Iron deficiency anemia, unspecified: Secondary | ICD-10-CM | POA: Diagnosis not present

## 2018-05-30 DIAGNOSIS — N186 End stage renal disease: Secondary | ICD-10-CM | POA: Diagnosis not present

## 2018-05-31 IMAGING — RF DG C-ARM 61-120 MIN
1 series · 4 of 4 positions shown · non-contrast
Comparison: 04/13/2017.

CLINICAL DATA: Catheter insertion.

EXAM:
CHEST 1 VIEW

[Series 1: run · 4 of 119 frames shown]
[frame 18/119]
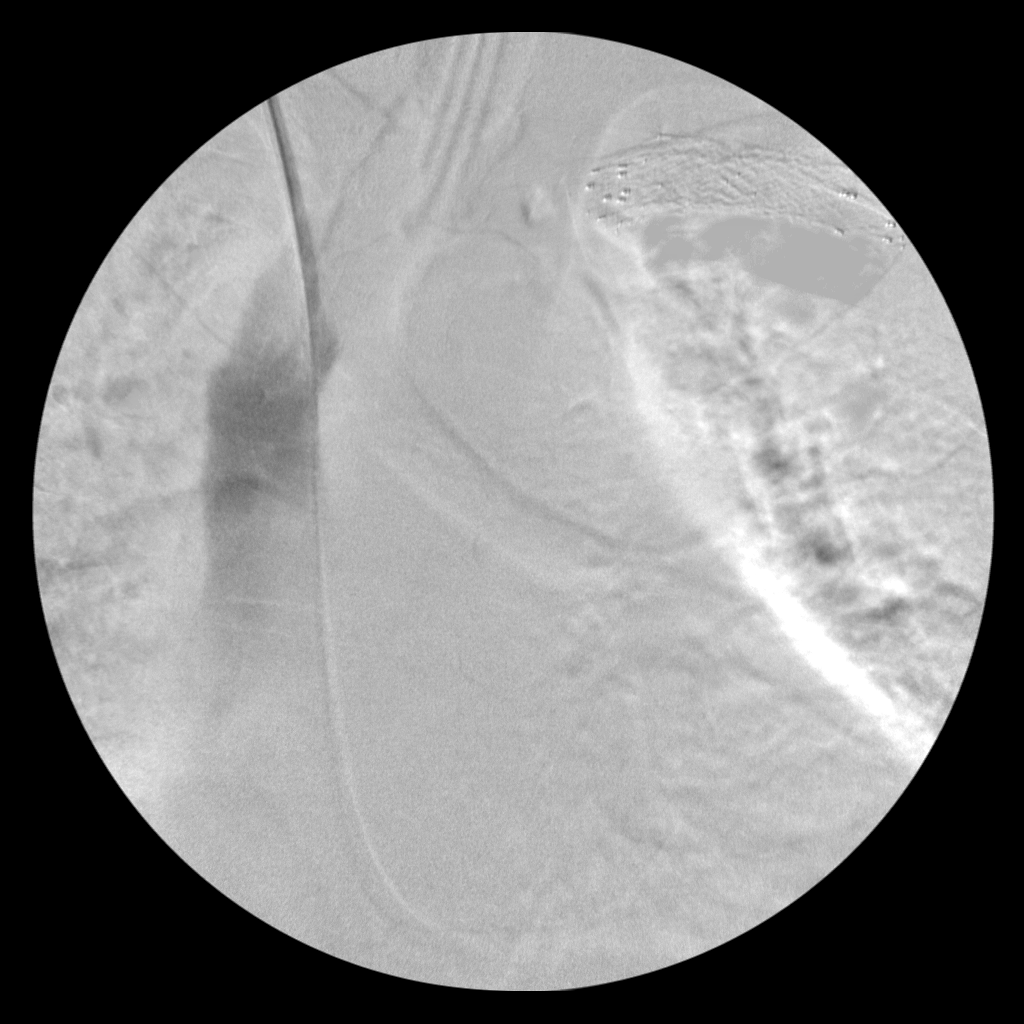
[frame 51/119]
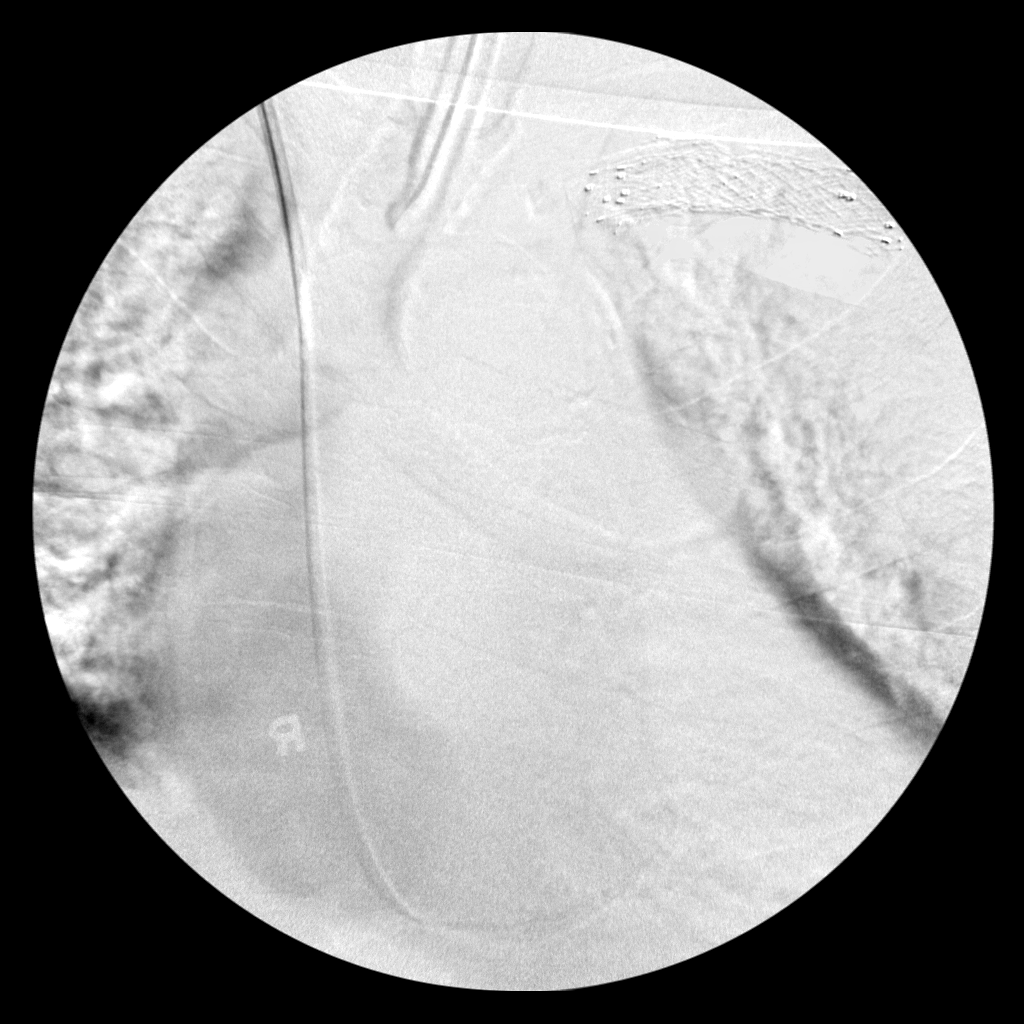
[frame 60/119]
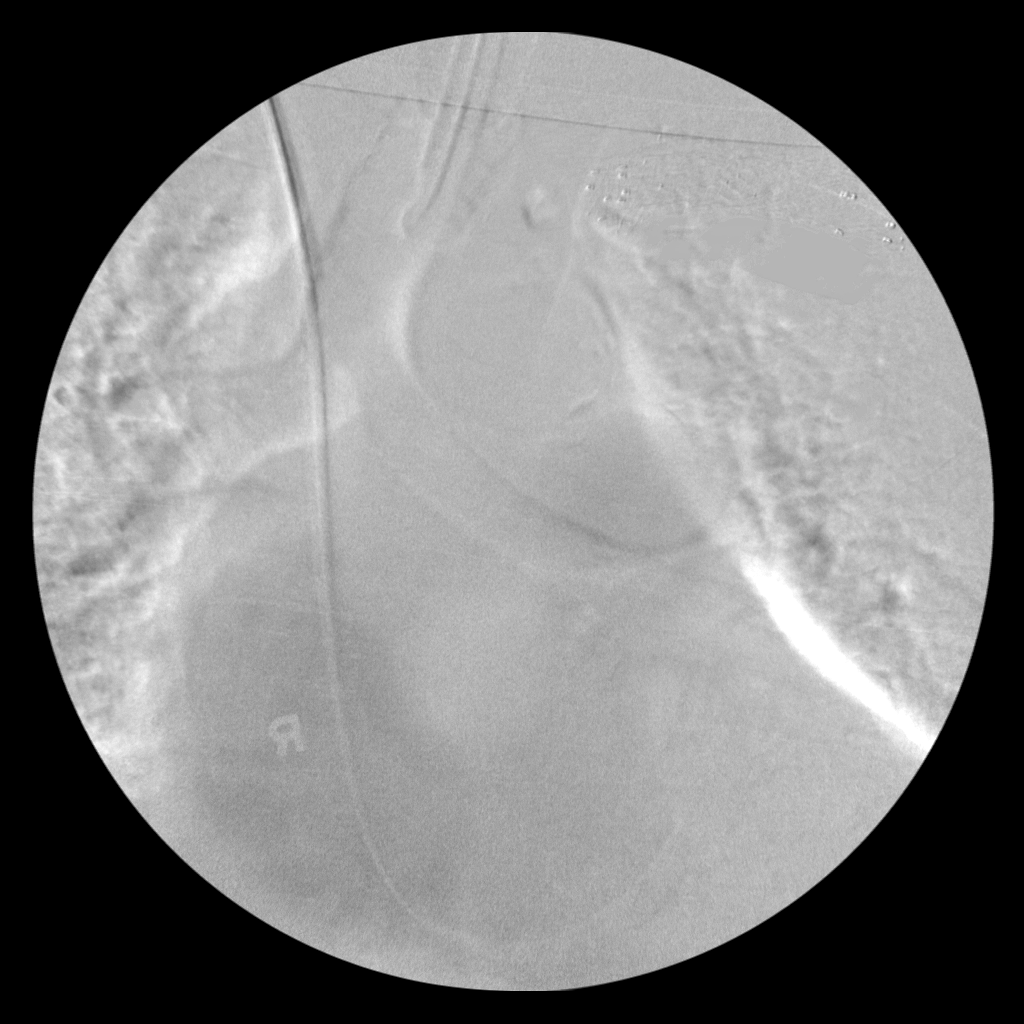
[frame 102/119]
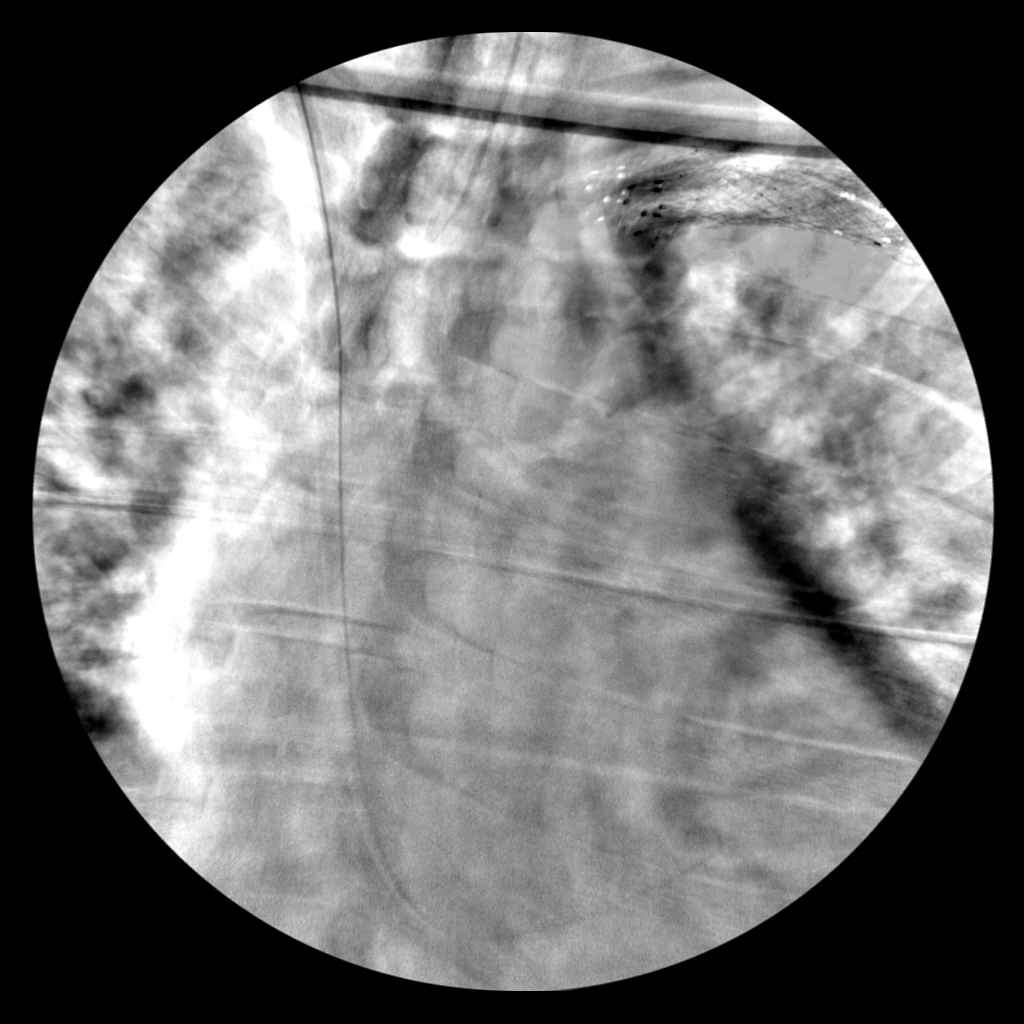

[4 of 4 positions shown; findings below may reference images not displayed]

FINDINGS: Procedural images reveal lead wire with tip in the right ventricle.
Contrast is noted in the superior vena cava and right heart.
Superior vena cava widely patent. Vascular stents noted left
subclavian region. 3 minutes 26 seconds fluoroscopy time.
IMPRESSION: Procedural images as above .

## 2018-06-01 DIAGNOSIS — N186 End stage renal disease: Secondary | ICD-10-CM | POA: Diagnosis not present

## 2018-06-01 DIAGNOSIS — D509 Iron deficiency anemia, unspecified: Secondary | ICD-10-CM | POA: Diagnosis not present

## 2018-06-01 DIAGNOSIS — Z992 Dependence on renal dialysis: Secondary | ICD-10-CM | POA: Diagnosis not present

## 2018-06-01 DIAGNOSIS — N2581 Secondary hyperparathyroidism of renal origin: Secondary | ICD-10-CM | POA: Diagnosis not present

## 2018-06-02 ENCOUNTER — Other Ambulatory Visit (INDEPENDENT_AMBULATORY_CARE_PROVIDER_SITE_OTHER): Payer: Self-pay | Admitting: Vascular Surgery

## 2018-06-02 DIAGNOSIS — N186 End stage renal disease: Secondary | ICD-10-CM

## 2018-06-02 DIAGNOSIS — Z992 Dependence on renal dialysis: Secondary | ICD-10-CM | POA: Diagnosis not present

## 2018-06-03 ENCOUNTER — Ambulatory Visit (INDEPENDENT_AMBULATORY_CARE_PROVIDER_SITE_OTHER): Payer: Medicare Other | Admitting: Vascular Surgery

## 2018-06-03 ENCOUNTER — Ambulatory Visit (INDEPENDENT_AMBULATORY_CARE_PROVIDER_SITE_OTHER): Payer: Medicare Other

## 2018-06-03 ENCOUNTER — Encounter (INDEPENDENT_AMBULATORY_CARE_PROVIDER_SITE_OTHER): Payer: Self-pay | Admitting: Vascular Surgery

## 2018-06-03 VITALS — BP 184/95 | HR 68 | Resp 19 | Ht 74.0 in | Wt 270.0 lb

## 2018-06-03 DIAGNOSIS — Z794 Long term (current) use of insulin: Secondary | ICD-10-CM | POA: Diagnosis not present

## 2018-06-03 DIAGNOSIS — N186 End stage renal disease: Secondary | ICD-10-CM | POA: Diagnosis not present

## 2018-06-03 DIAGNOSIS — I10 Essential (primary) hypertension: Secondary | ICD-10-CM

## 2018-06-03 DIAGNOSIS — I120 Hypertensive chronic kidney disease with stage 5 chronic kidney disease or end stage renal disease: Secondary | ICD-10-CM

## 2018-06-03 DIAGNOSIS — E1122 Type 2 diabetes mellitus with diabetic chronic kidney disease: Secondary | ICD-10-CM

## 2018-06-03 DIAGNOSIS — I428 Other cardiomyopathies: Secondary | ICD-10-CM

## 2018-06-03 DIAGNOSIS — I739 Peripheral vascular disease, unspecified: Secondary | ICD-10-CM | POA: Diagnosis not present

## 2018-06-03 DIAGNOSIS — E119 Type 2 diabetes mellitus without complications: Secondary | ICD-10-CM

## 2018-06-03 NOTE — Patient Instructions (Signed)
AV Fistula Placement  Arteriovenous (AV) fistula placement is a surgical procedure to create a connection between a blood vessel that carries blood away from your heart (artery) and a blood vessel that returns blood to your heart (vein). The connection is called a fistula. It is often made in the forearm or upper arm.  You may need this procedure if you are getting hemodialysis treatments for kidney disease. An AV fistula makes your vein larger and stronger over several months. This makes the vein a safe and easy spot to insert the needles that are used for hemodialysis.  Tell a health care provider about:  · Any allergies you have.  · All medicines you are taking, including vitamins, herbs, eye drops, creams, and over-the-counter medicines.  · Any problems you or family members have had with anesthetic medicines.  · Any blood disorders you have.  · Any surgeries you have had.  · Any medical conditions you have.  What are the risks?  Generally, this is a safe procedure. However, problems may occur, including:  · Infection.  · Blood clot (thrombosis).  · Reduced blood flow (stenosis).  · Weakening or ballooning out of the fistula (aneurysm).  · Bleeding.  · Allergic reactions to medicines.  · Nerve damage.  · Swelling near the fistula (lymphedema).  · Weakening of your heart (congestive heart failure).  · Failure of the procedure.    What happens before the procedure?  · Imaging tests of your arm may be done to find the best place for the fistula.  · Ask your health care provider about:  ? Changing or stopping your regular medicines. This is especially important if you are taking diabetes medicines or blood thinners.  ? Taking medicines such as aspirin and ibuprofen. These medicines can thin your blood. Do not take these medicines before your procedure if your health care provider instructs you not to.  · Follow instructions from your health care provider about eating or drinking restrictions.  · You may be given  antibiotic medicine to help prevent infection.  · Ask your health care provider how your surgical site will be marked or identified.  · Plan to have someone take you home after the procedure.  What happens during the procedure?  · To reduce your risk of infection:  ? Your health care team will wash or sanitize their hands.  ? Your skin will be washed with soap.  ? Hair may be removed from the surgical area.  · An IV tube will be started in one of your veins.  · You will be given one or more of the following:  ? A medicine to help you relax (sedative).  ? A medicine to numb the area (local anesthetic).  ? A medicine to make you fall asleep (general anesthetic).  ? A medicine that is injected into an area of your body to numb everything below the injection site (regional anesthetic).  · The fistula site will be cleaned with a germ-killing solution (antiseptic).  · A cut (incision) will be made on the inner side of your arm.  · A vein and an artery will be opened and connected with stitches (sutures).  · The incision will be closed with sutures or clips.  · A bandage (dressing) will be placed over the area.  The procedure may vary among health care providers and hospitals.  What happens after the procedure?  · Your blood pressure, heart rate, breathing rate, and blood oxygen level will be   monitored often until the medicines you were given have worn off.  · Your fistula site will be checked for bleeding or swelling.  · You will be given pain medication as needed.  · Do not drive for 24 hours if you received a sedative.  This information is not intended to replace advice given to you by your health care provider. Make sure you discuss any questions you have with your health care provider.  Document Released: 07/01/2015 Document Revised: 12/26/2015 Document Reviewed: 10/10/2014  Elsevier Interactive Patient Education © 2017 [employer]

## 2018-06-03 NOTE — Assessment & Plan Note (Signed)
blood pressure control important in reducing the progression of atherosclerotic disease. On appropriate oral medications.  

## 2018-06-03 NOTE — Assessment & Plan Note (Signed)
To evaluate his dialysis access options today vein mapping was performed.  This demonstrates what appears to be adequate basilic veins bilaterally.  The left cephalic vein also appears to be of reasonably good size for fistula creation in the upper arm.  His left upper extremity arterial study showed triphasic flow in the brachial, ulnar, and radial arteries. Given these anatomic findings, I would recommend a left brachiocephalic AV fistula be created.  I discussed the risks and benefits the procedures.  I discussed the complications including thrombosis, steal syndrome, injury to nearby structures, ischemic neuropathy, and need for further procedures.  The patient voices his understanding and is agreeable to proceed with left brachiocephalic AV fistula creation.

## 2018-06-03 NOTE — Progress Notes (Signed)
Patient ID: Jacob [name], male   DOB: 09-Jun-1951, 67 y.o.   MRN: [id number]  Chief Complaint  Patient presents with  . New Patient (Initial Visit)    Vein mapping consult    HPI Jacob [name] is a 67 y.o. male.  I am asked to see the patient by Dr. Lowanda Foster for evaluation of dialysis access.  The patient reports being on dialysis for about 10 years now.  He currently gets dialysis on Mondays, Wednesdays, and Fridays and is using a PermCath.  He has had radiocephalic AV fistulas bilaterally that worked for some time but have been occluded for a while.  He had a right arm hero graft that he used most recently but that has failed.  He has previously gotten most of his dialysis access work done in [place] and overall this has worked reasonably well.  To evaluate his dialysis access options today vein mapping was performed.  This demonstrates what appears to be adequate basilic veins bilaterally.  The left cephalic vein also appears to be of reasonably good size for fistula creation in the upper arm.  His left upper extremity arterial study showed triphasic flow in the brachial, ulnar, and radial arteries.   Past Medical History:  Diagnosis Date  . Anemia in chronic kidney disease(285.21)   . Anxiety   . Arthritis   . Depression   . ESRD (end stage renal disease) on dialysis [facility])    "MWF; [facility] [place]" (02/18/2017)  . Essential hypertension   . GERD (gastroesophageal reflux disease)   . Glaucoma   . History of blood transfusion   . History of stroke 2016  . Insomnia   . Nonischemic cardiomyopathy ([place])   . Stroke [facility])    mini stroke  . Type 2 diabetes mellitus ([place])     Past Surgical History:  Procedure Laterality Date  . A/V FISTULAGRAM Right 10/06/2016   Procedure: A/V Fistulagram;  Surgeon: [name], MD;  Location: [place] CV LAB;  Service: Cardiovascular;  Laterality: Right;  . A/V FISTULAGRAM Right 04/27/2017   Procedure: A/V Fistulagram;  Surgeon: [name], MD;  Location: [place] CV LAB;  Service: Cardiovascular;  Laterality: Right;  . A/V SHUNTOGRAM Right 11/17/2017   Procedure: A/V SHUNTOGRAM;  Surgeon: Waynetta Sandy, MD;  Location: [place] CV LAB;  Service: Cardiovascular;  Laterality: Right;  . ABDOMINAL AORTOGRAM N/A 12/30/2016   Procedure: Abdominal Aortogram;  Surgeon: Waynetta Sandy, MD;  Location: [place] CV LAB;  Service: Cardiovascular;  Laterality: N/A;  . ABDOMINAL AORTOGRAM N/A 04/08/2017   Procedure: ABDOMINAL AORTOGRAM;  Surgeon: Conrad [place], MD;  Location: [place] CV LAB;  Service: Cardiovascular;  Laterality: N/A;  . ABDOMINAL AORTOGRAM W/LOWER EXTREMITY N/A 02/23/2017   Procedure: Abdominal Aortogram w/Lower Extremity;  Surgeon: [name], MD;  Location: [place] CV LAB;  Service: Cardiovascular;  Laterality: N/A;  Rt. leg  . AMPUTATION Right 02/28/2017   Procedure: RIGHT BELOW KNEE AMPUTATION;  Surgeon: [name], MD;  Location: [place];  Service: Vascular;  Laterality: Right;  . AV FISTULA PLACEMENT     Hx: of  . AV FISTULA PLACEMENT Right 05/04/2013   Procedure: ARTERIOVENOUS (AV) FISTULA CREATION- RIGHT ARM;  Surgeon: Conrad [place], MD;  Location: [place];  Service: Vascular;  Laterality: Right;  Ultrasound guided  . AV FISTULA PLACEMENT Right 07/28/2016   Procedure: BRACHIOCEPHALIC ARTERIOVENOUS (AV) FISTULA CREATION;  Surgeon: [name], MD;  [facility];  Service: Vascular;  Laterality: Right;  . BACK SURGERY    . BELOW KNEE LEG AMPUTATION     1 PRIOR AMPUTATION ON FOOT  RT LEG/ FOOT  . CAPD INSERTION N/A 03/11/2018   Procedure: LAPAROSCOPIC INSERTION OF PERIOTONEAL DIALYSIS CATHETER;  Surgeon: Clovis Riley, MD;  Location: [place];  Service: General;  Laterality: N/A;  . CATARACT EXTRACTION W/PHACO Left 07/16/2014   Procedure: CATARACT EXTRACTION PHACO AND INTRAOCULAR LENS PLACEMENT ([place]);  Surgeon: [name], MD;  Location: [facility];  Service: Ophthalmology;   Laterality: Left;  CDE:8.86  . CATARACT EXTRACTION W/PHACO Right 07/30/2014   Procedure: CATARACT EXTRACTION PHACO AND INTRAOCULAR LENS PLACEMENT (IOC);  Surgeon: [name], MD;  Location: [facility];  Service: Ophthalmology;  Laterality: Right;  CDE 8.99  . COLONOSCOPY     Hx: of  . EYE SURGERY    . FISTULA SUPERFICIALIZATION Right 07/28/2016   Procedure: FISTULA SUPERFICIALIZATION;  Surgeon: [name], MD;  Location: [place];  Service: Vascular;  Laterality: Right;  . FISTULA SUPERFICIALIZATION Right 10/13/2016   Procedure: BRACHIOCEPHALIC ARTERIOVENOUS FISTULA SUPERFICIALIZATION;  Surgeon: [name], MD;  Location: [place];  Service: Vascular;  Laterality: Right;  . FISTULOGRAM N/A 05/04/2013   Procedure: CENTRAL VENOGRAM;  Surgeon: Conrad [place], MD;  Location: [place];  Service: Vascular;  Laterality: N/A;  . INSERTION OF DIALYSIS CATHETER Right 05/04/2013   Procedure: INSERTION OF DIALYSIS CATHETER;  Surgeon: Conrad [place], MD;  Location: [place];  Service: Vascular;  Laterality: Right;  Ultrasound guided  . INSERTION OF DIALYSIS CATHETER N/A 07/28/2016   Procedure: INSERTION OF DIALYSIS CATHETER;  Surgeon: [name], MD;  Location: [place];  Service: Vascular;  Laterality: N/A;  . INSERTION OF DIALYSIS CATHETER Right 03/07/2018   Procedure: INSERTION OF DIALYSIS CATHETER;  Surgeon: Conrad [place], MD;  Location: [employer];  Service: Vascular;  Laterality: Right;  . IR FLUORO GUIDE CV LINE RIGHT  04/13/2017  . IR FLUORO GUIDE CV LINE RIGHT  05/19/2018  . IR THROMBECTOMY AV FISTULA W/THROMBOLYSIS/PTA/STENT INC/SHUNT/IMG RT Right 09/07/2017  . IR US GUIDE VASC ACCESS RIGHT  04/13/2017  . IR US GUIDE VASC ACCESS RIGHT  09/07/2017  . LIGATION OF ARTERIOVENOUS  FISTULA Left 05/04/2013   Procedure: LIGATION OF LEFT RADIAL CEPHALIC ARTERIOVENOUS  FISTULA;  Surgeon: Conrad , MD;  Location: [place];  Service: Vascular;  Laterality: Left;  Ultrasound guided  . LIGATION OF ARTERIOVENOUS   FISTULA Right 07/28/2016   Procedure: LIGATION OF RADIOCEPHALIC ARTERIOVENOUS  FISTULA;  Surgeon: [name], MD;  Location: [place];  Service: Vascular;  Laterality: Right;  . LOWER EXTREMITY ANGIOGRAPHY N/A 12/30/2016   Procedure: Lower Extremity Angiography;  Surgeon: Waynetta Sandy, MD;  Location: [place] CV LAB;  Service: Cardiovascular;  Laterality: N/A;  . LUMBAR SPINE SURGERY    . PERIPHERAL VASCULAR ATHERECTOMY Right 12/30/2016   Procedure: Peripheral Vascular Atherectomy;  Surgeon: Waynetta Sandy, MD;  Location: [place] CV LAB;  Service: Cardiovascular;  Laterality: Right;  AT and PT  . PERIPHERAL VASCULAR BALLOON ANGIOPLASTY Right 12/30/2016   Procedure: Peripheral Vascular Balloon Angioplasty;  Surgeon: Waynetta Sandy, MD;  Location: [place] CV LAB;  Service: Cardiovascular;  Laterality: Right;  PTA of PT and AT  . PERIPHERAL VASCULAR BALLOON ANGIOPLASTY  02/23/2017   Procedure: Peripheral Vascular Balloon Angioplasty;  Surgeon: [name], MD;  Location: [place] CV LAB;  Service: Cardiovascular;;  RT. Anterior Tib.  [name]  PERIPHERAL VASCULAR BALLOON ANGIOPLASTY  04/08/2017   Procedure: PERIPHERAL VASCULAR BALLOON ANGIOPLASTY;  Surgeon: Conrad [place], MD;  Location: [place] CV LAB;  Service: Cardiovascular;;  . PERIPHERAL VASCULAR CATHETERIZATION N/A 01/24/2015   Procedure: Fistulagram;  Surgeon: Conrad [place], MD;  Location: [place] CV LAB;  Service: Cardiovascular;  Laterality: N/A;  . PERIPHERAL VASCULAR CATHETERIZATION Right 06/04/2016   Procedure: A/V Shuntogram/Fistulagram;  Surgeon: Conrad [place], MD;  Location: [place] CV LAB;  Service: Cardiovascular;  Laterality: Right;  . REVISON OF ARTERIOVENOUS FISTULA Right 01/02/2014   Procedure: REVISON OF ARTERIOVENOUS FISTULA ANASTOMOSIS;  Surgeon: Conrad [place], MD;  Location: [place];  Service: Vascular;  Laterality: Right;  . REVISON OF ARTERIOVENOUS FISTULA Right 01/02/2016    Procedure: REVISION OF RADIOCEPHALIC ARTERIOVENOUS FISTULA  with BOVINE PATCH ANGIOPLASTY RIGHT RADIAL ARTERY;  Surgeon: Mal [name], MD;  Location: [place];  Service: Vascular;  Laterality: Right;  . SHUNTOGRAM N/A 11/07/2013   Procedure: Fistulogram;  Surgeon: [name], MD;  Location: [facility] CATH LAB;  Service: Cardiovascular;  Laterality: N/A;  . THROMBECTOMY AND REVISION OF ARTERIOVENTOUS (AV) GORETEX  GRAFT Right 01/20/2018   Procedure: THROMBECTOMY  OF ARTERIOVENTOUS (AV)  HERO GRAFT;  Surgeon: [name], MD;  Location: [place];  Service: Vascular;  Laterality: Right;  . THROMBECTOMY W/ EMBOLECTOMY Right 07/18/2017   Procedure: THROMBECTOMY ARTERIOVENOUS HERO GRAFT ARM;  Surgeon: [name], MD;  Location: [place];  Service: Vascular;  Laterality: Right;  . THROMBECTOMY W/ EMBOLECTOMY Right 03/07/2018   Procedure: THROMBECTOMY OF HERO GRAFT; POSSIBLE REMOVAL;  Surgeon: Conrad [place], MD;  Location: [place];  Service: Vascular;  Laterality: Right;  . TRANSMETATARSAL AMPUTATION Right 01/01/2017   Procedure: TRANSMETATARSAL AMPUTATION;  Surgeon: [name], MD;  Location: [place];  Service: Vascular;  Laterality: Right;  [name] VASCULAR ACCESS DEVICE INSERTION Right 05/13/2017   Procedure: INSERTION OF HERO VASCULAR ACCESS DEVICE RIGHT UPPER ARM;  Surgeon: Waynetta Sandy, MD;  Location: [place];  Service: Vascular;  Laterality: Right;  . VENOGRAM N/A 05/13/2017   Procedure: CENTRAL VENOGRAM;  Surgeon: Waynetta Sandy, MD;  Location: [facility] OR;  Service: Vascular;  Laterality: N/A;    Family History  Problem Relation Age of Onset  . Heart attack Brother        53  . Heart disease Brother        before age 23  . Hypertension Brother   . Heart attack Brother        57  . Heart attack Brother        59  . Diabetes Mother   . Hypertension Mother   . Heart disease Mother   . Heart disease Father   . Hypertension Father   . Other Father        amputation  .  Hypertension Sister   . Heart disease Sister   . Vision loss Maternal Uncle      Social History Social History   Tobacco Use  . Smoking status: Never Smoker  . Smokeless tobacco: Never Used  Substance Use Topics  . Alcohol use: Yes    Comment: 1- fifth of gin a week  . Drug use: No     Allergies  Allergen Reactions  . Bee Venom Anaphylaxis  . Iodine Swelling  . Penicillins Swelling and Other (See Comments)    SWELLING REACTION UNSPECIFIED   Has patient had a PCN reaction causing immediate rash, facial/tongue/throat swelling, SOB or  lightheadedness with hypotension: No Has patient had a PCN reaction causing severe rash involving mucus membranes or skin necrosis: No Has patient had a PCN reaction that required hospitalization No Has patient had a PCN reaction occurring within the last 10 years: No If all of the above answers are "NO", then may proceed with Cephalosporin use.  [name] Plavix [Clopidogrel] Other (See Comments)    Caused Nose bleed  . Sulfadiazine Other (See Comments)    1% Silver Sulfadiazine cream causes burning over a large area of skin.    Current Outpatient Medications  Medication Sig Dispense Refill  . acetaminophen (TYLENOL) 325 MG tablet Take 650 mg by mouth every 6 (six) hours as needed for mild pain. Do not exceed 4 gms of tylenol in 24 hours    . B Complex-C-Folic Acid (DIALYVITE PO) Take 1 tablet by mouth every Monday, Wednesday, and Friday.    . calcium acetate (PHOSLO) 667 MG capsule Take 667-2,001 mg by mouth See admin instructions. Take 3 capsules (2019m) three times daily with a meal and 1 capsule (6657m with a snack.    . carvedilol (COREG) 12.5 MG tablet Take 1 tablet (12.5 mg total) by mouth See admin instructions. Take 1 tablet (12.5 mg) by mouth twice daily on Sunday, Tuesday, Thursday, Saturday (hold on dialysis days)    . cinacalcet (SENSIPAR) 30 MG tablet Take 30 mg by mouth daily with breakfast.     . clopidogrel (PLAVIX) 75 MG tablet       . colchicine 0.6 MG tablet Take by mouth.    . diclofenac sodium (VOLTAREN) 1 % GEL Apply 2 g topically 4 (four) times daily as needed (pain).   1  . EPINEPHrine (EPIPEN 2-PAK) 0.3 mg/0.3 mL IJ SOAJ injection Inject 0.3 mg into the muscle once as needed (anaphylaxis).    . Marland KitchenYDROcodone-acetaminophen (NORCO/VICODIN) 5-325 MG tablet Take 1 tablet by mouth every 6 (six) hours as needed for moderate pain. 20 tablet 0  . LANTUS SOLOSTAR 100 UNIT/ML Solostar Pen Inject 15 Units into the skin at bedtime. 15 mL 0  . omeprazole (PRILOSEC) 40 MG capsule Take 40 mg by mouth daily.    . ramipril (ALTACE) 10 MG capsule Take 10 mg by mouth 2 (two) times daily.    . sevelamer carbonate (RENVELA) 800 MG tablet Take by mouth.    . traMADol (ULTRAM) 50 MG tablet Take by mouth.     No current facility-administered medications for this visit.       REVIEW OF SYSTEMS (Negative unless checked)  Constitutional: '[]' Weight loss  '[]' Fever  '[]' Chills Cardiac: '[]' Chest pain   '[]' Chest pressure   '[x]' Palpitations   '[]' Shortness of breath when laying flat   '[]' Shortness of breath at rest   '[x]' Shortness of breath with exertion. Vascular:  '[]' Pain in legs with walking   '[]' Pain in legs at rest   '[]' Pain in legs when laying flat   '[]' Claudication   '[]' Pain in feet when walking  '[]' Pain in feet at rest  '[]' Pain in feet when laying flat   '[]' History of DVT   '[]' Phlebitis   '[]' Swelling in legs   '[]' Varicose veins   '[]' Non-healing ulcers Pulmonary:   '[]' Uses home oxygen   '[]' Productive cough   '[]' Hemoptysis   '[]' Wheeze  '[]' COPD   '[]' Asthma Neurologic:  '[]' Dizziness  '[]' Blackouts   '[]' Seizures   '[x]' History of stroke   '[]' History of TIA  '[]' Aphasia   '[]' Temporary blindness   '[]' Dysphagia   '[]' Weakness or numbness in arms   '[]' Weakness or  numbness in legs Musculoskeletal:  '[x]' Arthritis   '[]' Joint swelling   '[x]' Joint pain   '[]' Low back pain Hematologic:  '[]' Easy bruising  '[]' Easy bleeding   '[]' Hypercoagulable state   '[x]' Anemic  '[]' Hepatitis Gastrointestinal:  '[]' Blood in  stool   '[]' Vomiting blood  '[]' Gastroesophageal reflux/heartburn   '[]' Abdominal pain Genitourinary:  '[x]' Chronic kidney disease   '[]' Difficult urination  '[]' Frequent urination  '[]' Burning with urination   '[]' Hematuria Skin:  '[]' Rashes   '[]' Ulcers   '[]' Wounds Psychological:  '[x]' History of anxiety   '[]'  History of major depression.    Physical Exam BP (!) 184/95 (BP Location: Left Arm, Patient Position: Sitting)   Pulse 68   Resp 19   Ht '6\' 2"'  (1.88 m)   Wt 270 lb (122.5 kg)   BMI 34.67 kg/m  Gen:  WD/WN, NAD Head: [place]/AT, No temporalis wasting.  Ear/Nose/Throat: Hearing grossly intact, nares w/o erythema or drainage, oropharynx w/o Erythema/Exudate Eyes: Conjunctiva clear, sclera non-icteric  Neck: trachea midline.   Pulmonary:  Good air movement, respirations not labored, no use of accessory muscles Cardiac: RRR, no JVD Vascular: PermCath in right chest.  Allen's test normal and left arm Vessel Right Left  Radial  1+ palpable Palpable                          PT  not palpable  not palpable  DP  not palpable  not palpable   Gastrointestinal: soft, non-tender/non-distended.  Musculoskeletal: M/S 5/5 throughout.  Walks with a walker and bilateral below-knee amputation prostheses Neurologic: Sensation grossly intact in extremities.  Symmetrical.  Speech is fluent. Motor exam as listed above. Psychiatric: Judgment intact, Mood & affect appropriate for pt's clinical situation. Dermatologic: No rashes or ulcers noted.  No cellulitis or open wounds.   Radiology Ir Cyndy Freeze Guide Cv Line Right  Result Date: 05/19/2018 INDICATION: 67 year old male with a history of end-stage renal disease. He presents for catheter exchange secondary to malfunction. EXAM: IMAGE GUIDED EXCHANGE OF TUNNELED HEMODIALYSIS CATHETER MEDICATIONS: 1 g vancomycin. The antibiotic was given in an appropriate time interval prior to skin puncture. ANESTHESIA/SEDATION: None. FLUOROSCOPY TIME:  Fluoroscopy Time: 0 minutes 6  seconds (1 mGy). COMPLICATIONS: None PROCEDURE: Informed written consent was obtained from the patient after a discussion of the risks, benefits, and alternatives to treatment. Questions regarding the procedure were encouraged and answered. The right neck and chest were prepped with chlorhexidine in a sterile fashion, and a sterile drape was applied covering the operative field. Maximum barrier sterile technique with sterile gowns and gloves were used for the procedure. A timeout was performed prior to the initiation of the procedure. Patient positioned supine position on the fluoroscopy table. The right neck and right indwelling catheter were prepped and draped in the usual sterile fashion. Initial image recorded and sent to PACs. 1% lidocaine was used for local anesthesia. Catheter was freed from the soft tissues. Seldinger technique was then used to exchange the old catheter for a new 23 cm tip to cuff catheter. Excellent flow was confirmed. Catheter was sutured in position. Gel-Foam slurry was infused into the soft tissue tract. Sterile dressing was placed. Patient tolerated the procedure well and remained hemodynamically stable throughout. No complications encountered and no significant blood loss. IMPRESSION: Status post routine exchange of tunneled right IJ hemodialysis catheter for a new 23 cm tip to cuff catheter. Signed, Dulcy Fanny. Dellia Nims, [facility] Radiology Electronically Signed   By: Corrie Mckusick D.O.  On: 05/19/2018 10:45    Labs Recent Results (from the past 2160 hour(s))  Glucose, capillary     Status: Abnormal   Collection Time: 03/07/18  9:48 AM  Result Value Ref Range   Glucose-Capillary 201 (H) 70 - 99 mg/dL  Surgical pcr screen     Status: Abnormal   Collection Time: 03/07/18 10:55 AM  Result Value Ref Range   MRSA, PCR NEGATIVE NEGATIVE   Staphylococcus aureus POSITIVE (A) NEGATIVE    Comment: (NOTE) The Xpert SA Assay  (FDA approved for NASAL specimens in patients 63 years of age and older), is one component of a comprehensive surveillance program. It is not intended to diagnose infection nor to guide or monitor treatment. Performed at [facility], [place] [address] [place] [address]   CBC     Status: None   Collection Time: 03/07/18 11:13 AM  Result Value Ref Range   WBC 5.7 4.0 - 10.5 K/uL   RBC 4.61 4.22 - 5.81 MIL/uL   Hemoglobin 14.0 13.0 - 17.0 g/dL   HCT 44.3 39.0 - 52.0 %   MCV 96.1 78.0 - 100.0 fL   MCH 30.4 26.0 - 34.0 pg   MCHC 31.6 30.0 - 36.0 g/dL   RDW 13.0 11.5 - 15.5 %   Platelets 231 150 - 400 K/uL    Comment: Performed at [facility], [place] [address], [place], [place] [address]  Basic metabolic panel     Status: Abnormal   Collection Time: 03/07/18 11:13 AM  Result Value Ref Range   Sodium 136 135 - 145 mmol/L   Potassium 5.1 3.5 - 5.1 mmol/L   Chloride 94 (L) 98 - 111 mmol/L   CO2 24 22 - 32 mmol/L   Glucose, Bld 187 (H) 70 - 99 mg/dL   BUN 63 (H) 8 - 23 mg/dL   Creatinine, Ser 13.55 (H) 0.61 - 1.24 mg/dL   Calcium 8.6 (L) 8.9 - 10.3 mg/dL   GFR calc non Af Amer 3 (L) >60 mL/min   GFR calc Af Amer 4 (L) >60 mL/min    Comment: (NOTE) The eGFR has been calculated using the CKD EPI equation. This calculation has not been validated in all clinical situations. eGFR's persistently <60 mL/min signify possible Chronic Kidney Disease.    Anion gap 18 (H) 5 - 15    Comment: Performed at [facility], [place] [address] [place], [place] [address]  Glucose, capillary     Status: Abnormal   Collection Time: 03/07/18 12:29 PM  Result Value Ref Range   Glucose-Capillary 161 (H) 70 - 99 mg/dL   Comment 1 Notify RN   Glucose, capillary     Status: Abnormal   Collection Time: 03/07/18  2:51 PM  Result Value Ref Range   Glucose-Capillary 133 (H) 70 - 99 mg/dL   Comment 1 Notify RN    Comment 2 Document in Chart   Glucose, capillary     Status: Abnormal    Collection Time: 03/07/18  4:23 PM  Result Value Ref Range   Glucose-Capillary 162 (H) 70 - 99 mg/dL  Glucose, capillary     Status: Abnormal   Collection Time: 03/11/18  6:58 AM  Result Value Ref Range   Glucose-Capillary 123 (H) 70 - 99 mg/dL   Comment 1 Notify RN   I-STAT 4, (NA,K, GLUC, HGB,HCT)     Status: Abnormal   Collection Time: 03/11/18  7:33 AM  Result Value Ref Range  Sodium 139 135 - 145 mmol/L   Potassium 4.2 3.5 - 5.1 mmol/L   Glucose, Bld 115 (H) 70 - 99 mg/dL   HCT 42.0 39.0 - 52.0 %   Hemoglobin 14.3 13.0 - 17.0 g/dL  Glucose, capillary     Status: None   Collection Time: 03/11/18 10:47 AM  Result Value Ref Range   Glucose-Capillary 84 70 - 99 mg/dL    Assessment/Plan:  Insulin-requiring or dependent type II diabetes mellitus (HCC) blood glucose control important in reducing the progression of atherosclerotic disease. Also, involved in wound healing. On appropriate medications.   Essential hypertension, benign blood pressure control important in reducing the progression of atherosclerotic disease. On appropriate oral medications.   PAD (peripheral artery disease) (HCC) S/p BLE amputations  Nonischemic cardiomyopathy [facility]) May need cardiac evaluation prior to surgery.  ESRD (end stage renal disease) ([place]) To evaluate his dialysis access options today vein mapping was performed.  This demonstrates what appears to be adequate basilic veins bilaterally.  The left cephalic vein also appears to be of reasonably good size for fistula creation in the upper arm.  His left upper extremity arterial study showed triphasic flow in the brachial, ulnar, and radial arteries. Given these anatomic findings, I would recommend a left brachiocephalic AV fistula be created.  I discussed the risks and benefits the procedures.  I discussed the complications including thrombosis, steal syndrome, injury to nearby structures, ischemic neuropathy, and need for further procedures.  The  patient voices his understanding and is agreeable to proceed with left brachiocephalic AV fistula creation.      Leotis Pain 06/03/2018, 9:27 AM   This note was created with Dragon medical transcription system.  Any errors from dictation are unintentional.

## 2018-06-03 NOTE — Assessment & Plan Note (Signed)
S/p BLE amputations

## 2018-06-03 NOTE — Assessment & Plan Note (Signed)
blood glucose control important in reducing the progression of atherosclerotic disease. Also, involved in wound healing. On appropriate medications.  

## 2018-06-03 NOTE — Assessment & Plan Note (Signed)
May need cardiac evaluation prior to surgery.

## 2018-06-04 DIAGNOSIS — N2581 Secondary hyperparathyroidism of renal origin: Secondary | ICD-10-CM | POA: Diagnosis not present

## 2018-06-04 DIAGNOSIS — Z992 Dependence on renal dialysis: Secondary | ICD-10-CM | POA: Diagnosis not present

## 2018-06-04 DIAGNOSIS — D509 Iron deficiency anemia, unspecified: Secondary | ICD-10-CM | POA: Diagnosis not present

## 2018-06-04 DIAGNOSIS — N186 End stage renal disease: Secondary | ICD-10-CM | POA: Diagnosis not present

## 2018-06-06 DIAGNOSIS — Z992 Dependence on renal dialysis: Secondary | ICD-10-CM | POA: Diagnosis not present

## 2018-06-06 DIAGNOSIS — N186 End stage renal disease: Secondary | ICD-10-CM | POA: Diagnosis not present

## 2018-06-06 DIAGNOSIS — D509 Iron deficiency anemia, unspecified: Secondary | ICD-10-CM | POA: Diagnosis not present

## 2018-06-06 DIAGNOSIS — N2581 Secondary hyperparathyroidism of renal origin: Secondary | ICD-10-CM | POA: Diagnosis not present

## 2018-06-07 ENCOUNTER — Encounter (INDEPENDENT_AMBULATORY_CARE_PROVIDER_SITE_OTHER): Payer: Self-pay

## 2018-06-07 ENCOUNTER — Telehealth: Payer: Self-pay | Admitting: *Deleted

## 2018-06-07 NOTE — Telephone Encounter (Signed)
Patient may proceed with brachiocephalic AVF from a cardiac perspective.  He has a history of nonischemic cardiomyopathy with normalization of LVEF on medical therapy, including by follow-up echocardiogram in July of this year.

## 2018-06-07 NOTE — Telephone Encounter (Signed)
Surgery type: brachia cephalic AVF  Date: pending cardiac clearance  Anesthesia: General Anesthesia  Surgeon name: Dr. Leotis Pain with [facility]  No questions or concerns about medications  1st request from their office  Fax response to Devona Konig [phone]

## 2018-06-07 NOTE — Telephone Encounter (Signed)
Response faxed to [facility].

## 2018-06-08 DIAGNOSIS — D509 Iron deficiency anemia, unspecified: Secondary | ICD-10-CM | POA: Diagnosis not present

## 2018-06-08 DIAGNOSIS — Z992 Dependence on renal dialysis: Secondary | ICD-10-CM | POA: Diagnosis not present

## 2018-06-08 DIAGNOSIS — N2581 Secondary hyperparathyroidism of renal origin: Secondary | ICD-10-CM | POA: Diagnosis not present

## 2018-06-08 DIAGNOSIS — N186 End stage renal disease: Secondary | ICD-10-CM | POA: Diagnosis not present

## 2018-06-10 ENCOUNTER — Other Ambulatory Visit (INDEPENDENT_AMBULATORY_CARE_PROVIDER_SITE_OTHER): Payer: Self-pay | Admitting: Nurse Practitioner

## 2018-06-10 DIAGNOSIS — N2581 Secondary hyperparathyroidism of renal origin: Secondary | ICD-10-CM | POA: Diagnosis not present

## 2018-06-10 DIAGNOSIS — N186 End stage renal disease: Secondary | ICD-10-CM | POA: Diagnosis not present

## 2018-06-10 DIAGNOSIS — D509 Iron deficiency anemia, unspecified: Secondary | ICD-10-CM | POA: Diagnosis not present

## 2018-06-10 DIAGNOSIS — Z992 Dependence on renal dialysis: Secondary | ICD-10-CM | POA: Diagnosis not present

## 2018-06-13 DIAGNOSIS — Z992 Dependence on renal dialysis: Secondary | ICD-10-CM | POA: Diagnosis not present

## 2018-06-13 DIAGNOSIS — N186 End stage renal disease: Secondary | ICD-10-CM | POA: Diagnosis not present

## 2018-06-13 DIAGNOSIS — N2581 Secondary hyperparathyroidism of renal origin: Secondary | ICD-10-CM | POA: Diagnosis not present

## 2018-06-13 DIAGNOSIS — D509 Iron deficiency anemia, unspecified: Secondary | ICD-10-CM | POA: Diagnosis not present

## 2018-06-14 ENCOUNTER — Encounter
Admission: RE | Admit: 2018-06-14 | Discharge: 2018-06-14 | Disposition: A | Discharge status: Home / Self Care | Payer: Medicare Other | Source: Ambulatory Visit | Attending: Vascular Surgery | Admitting: Vascular Surgery

## 2018-06-14 ENCOUNTER — Other Ambulatory Visit: Payer: Self-pay

## 2018-06-14 DIAGNOSIS — I120 Hypertensive chronic kidney disease with stage 5 chronic kidney disease or end stage renal disease: Secondary | ICD-10-CM | POA: Diagnosis not present

## 2018-06-14 DIAGNOSIS — Z7902 Long term (current) use of antithrombotics/antiplatelets: Secondary | ICD-10-CM | POA: Diagnosis not present

## 2018-06-14 DIAGNOSIS — D631 Anemia in chronic kidney disease: Secondary | ICD-10-CM | POA: Diagnosis not present

## 2018-06-14 DIAGNOSIS — Z79899 Other long term (current) drug therapy: Secondary | ICD-10-CM | POA: Diagnosis not present

## 2018-06-14 DIAGNOSIS — E1151 Type 2 diabetes mellitus with diabetic peripheral angiopathy without gangrene: Secondary | ICD-10-CM | POA: Diagnosis not present

## 2018-06-14 DIAGNOSIS — K219 Gastro-esophageal reflux disease without esophagitis: Secondary | ICD-10-CM | POA: Diagnosis not present

## 2018-06-14 DIAGNOSIS — Z01818 Encounter for other preprocedural examination: Secondary | ICD-10-CM

## 2018-06-14 DIAGNOSIS — I428 Other cardiomyopathies: Secondary | ICD-10-CM | POA: Diagnosis not present

## 2018-06-14 DIAGNOSIS — Z794 Long term (current) use of insulin: Secondary | ICD-10-CM | POA: Diagnosis not present

## 2018-06-14 DIAGNOSIS — E1122 Type 2 diabetes mellitus with diabetic chronic kidney disease: Secondary | ICD-10-CM | POA: Diagnosis not present

## 2018-06-14 DIAGNOSIS — Z01810 Encounter for preprocedural cardiovascular examination: Secondary | ICD-10-CM | POA: Diagnosis not present

## 2018-06-14 DIAGNOSIS — Z79891 Long term (current) use of opiate analgesic: Secondary | ICD-10-CM | POA: Diagnosis not present

## 2018-06-14 DIAGNOSIS — Z8673 Personal history of transient ischemic attack (TIA), and cerebral infarction without residual deficits: Secondary | ICD-10-CM | POA: Diagnosis not present

## 2018-06-14 DIAGNOSIS — Z89511 Acquired absence of right leg below knee: Secondary | ICD-10-CM | POA: Diagnosis not present

## 2018-06-14 DIAGNOSIS — N186 End stage renal disease: Secondary | ICD-10-CM | POA: Diagnosis not present

## 2018-06-14 LAB — COMPREHENSIVE METABOLIC PANEL
ALBUMIN: 3.6 g/dL (ref 3.5–5.0)
ALT: 15 U/L (ref 0–44)
ANION GAP: 13 (ref 5–15)
AST: 15 U/L (ref 15–41)
Alkaline Phosphatase: 278 U/L — ABNORMAL HIGH (ref 38–126)
BILIRUBIN TOTAL: 0.6 mg/dL (ref 0.3–1.2)
BUN: 46 mg/dL — AB (ref 8–23)
CHLORIDE: 98 mmol/L (ref 98–111)
CO2: 24 mmol/L (ref 22–32)
Calcium: 8.8 mg/dL — ABNORMAL LOW (ref 8.9–10.3)
Creatinine, Ser: 12.39 mg/dL — ABNORMAL HIGH (ref 0.61–1.24)
GFR calc Af Amer: 4 mL/min — ABNORMAL LOW (ref >60)
GFR calc non Af Amer: 4 mL/min — ABNORMAL LOW (ref >60)
GLUCOSE: 174 mg/dL — AB (ref 70–99)
POTASSIUM: 4.7 mmol/L (ref 3.5–5.1)
SODIUM: 135 mmol/L (ref 135–145)
Total Protein: 8.7 g/dL — ABNORMAL HIGH (ref 6.5–8.1)

## 2018-06-14 LAB — CBC WITH DIFFERENTIAL/PLATELET
ABS IMMATURE GRANULOCYTES: 0.03 10*3/uL (ref 0.00–0.07)
Basophils Absolute: 0.1 10*3/uL (ref 0.0–0.1)
Basophils Relative: 1 %
EOS PCT: 19 %
Eosinophils Absolute: 1.1 10*3/uL — ABNORMAL HIGH (ref 0.0–0.5)
HEMATOCRIT: 50.8 % (ref 39.0–52.0)
HEMOGLOBIN: 16.2 g/dL (ref 13.0–17.0)
Immature Granulocytes: 1 %
LYMPHS ABS: 1.3 10*3/uL (ref 0.7–4.0)
LYMPHS PCT: 21 %
MCH: 30.3 pg (ref 26.0–34.0)
MCHC: 31.9 g/dL (ref 30.0–36.0)
MCV: 95 fL (ref 80.0–100.0)
MONO ABS: 0.6 10*3/uL (ref 0.1–1.0)
MONOS PCT: 10 %
NEUTROS ABS: 2.8 10*3/uL (ref 1.7–7.7)
Neutrophils Relative %: 48 %
Platelets: 229 10*3/uL (ref 150–400)
RBC: 5.35 MIL/uL (ref 4.22–5.81)
RDW: 14.4 % (ref 11.5–15.5)
WBC: 5.9 10*3/uL (ref 4.0–10.5)
nRBC: 0 % (ref 0.0–0.2)

## 2018-06-14 LAB — PROTIME-INR
INR: 1.1
Prothrombin Time: 14.1 seconds (ref 11.4–15.2)

## 2018-06-14 LAB — TYPE AND SCREEN
ABO/RH(D): O POS
ANTIBODY SCREEN: NEGATIVE

## 2018-06-14 LAB — APTT: aPTT: 36 seconds (ref 24–36)

## 2018-06-14 NOTE — Patient Instructions (Signed)
Your procedure is scheduled on: Thurs. 11/14 Report to Day Surgery. To find out your arrival time please call [phone] between 1PM - 3PM on Wed 11/13.  Remember: Instructions that are not followed completely may result in serious medical risk,  up to and including death, or upon the discretion of your surgeon and anesthesiologist your  surgery may need to be rescheduled.     _X__ 1. Do not eat food after midnight the night before your procedure.                 No gum chewing or hard candies. You may drink clear liquids up to 2 hours                 before you are scheduled to arrive for your surgery- DO not drink clear                 liquids within 2 hours of the start of your surgery.                 Clear Liquids include:  water,  __X__2.  On the morning of surgery brush your teeth with toothpaste and water, you                may rinse your mouth with mouthwash if you wish.  Do not swallow any toothpaste of mouthwash.     _X__ 3.  No Alcohol for 24 hours before or after surgery.   _X__ 4.  Do Not Smoke or use e-cigarettes For 24 Hours Prior to Your Surgery.                 Do not use any chewable tobacco products for at least 6 hours prior to                 surgery.  ____  5.  Bring all medications with you on the day of surgery if instructed.   __x__  6.  Notify your doctor if there is any change in your medical condition      (cold, fever, infections).     Do not wear jewelry, make-up, hairpins, clips or nail polish. Do not wear lotions, powders, or perfumes. You may wear deodorant. Do not shave 48 hours prior to surgery. Men may shave face and neck. Do not bring valuables to the hospital.    [facility] is not responsible for any belongings or valuables.  Contacts, dentures or bridgework may not be worn into surgery. Leave your suitcase in the car. After surgery it may be brought to your room. For patients admitted to the hospital, discharge  time is determined by your treatment team.   Patients discharged the day of surgery will not be allowed to drive home.   Please read over the following fact sheets that you were given:    __x__ Take these medicines the morning of surgery with A SIP OF WATER:    1. carvedilol (COREG) 12.5 MG tablet  2. omeprazole (PRILOSEC) 40 MG capsule take extra dose the night before and one the morning of surgery  3.   4.  5.  6.  ____ Fleet Enema (as directed)   __x__ Use CHG Soap as directed  ____ Use inhalers on the day of surgery  ____ Stop metformin 2 days prior to surgery    __x__ Take 1/2 of usual insulin dose the night before surgery 7.5 units.  No insulin the morning  of surgery.   _x___ Stop Plavix as per Dr. Bunnie Domino recommendation  ____ Stop Anti-inflammatories on    ____ Stop supplements until after surgery.    ____ Bring C-Pap to the hospital.

## 2018-06-14 NOTE — Pre-Procedure Instructions (Signed)
LABS FAXED TO DR Lucky Cowboy

## 2018-06-15 DIAGNOSIS — N186 End stage renal disease: Secondary | ICD-10-CM | POA: Diagnosis not present

## 2018-06-15 DIAGNOSIS — D509 Iron deficiency anemia, unspecified: Secondary | ICD-10-CM | POA: Diagnosis not present

## 2018-06-15 DIAGNOSIS — N2581 Secondary hyperparathyroidism of renal origin: Secondary | ICD-10-CM | POA: Diagnosis not present

## 2018-06-15 DIAGNOSIS — Z992 Dependence on renal dialysis: Secondary | ICD-10-CM | POA: Diagnosis not present

## 2018-06-15 MED ORDER — CLINDAMYCIN PHOSPHATE 900 MG/50ML IV SOLN
900.0000 mg | INTRAVENOUS | Status: AC
  Administered 2018-06-16: 900 mg via INTRAVENOUS

## 2018-06-16 ENCOUNTER — Other Ambulatory Visit: Payer: Self-pay

## 2018-06-16 ENCOUNTER — Encounter
Admission: RE | Disposition: A | Discharge status: Home / Self Care | Payer: Self-pay | Source: Ambulatory Visit | Attending: Vascular Surgery

## 2018-06-16 ENCOUNTER — Ambulatory Visit
Admission: RE | Admit: 2018-06-16 | Discharge: 2018-06-16 | Disposition: A | Discharge status: Home / Self Care | Payer: Medicare Other | Source: Ambulatory Visit | Attending: Vascular Surgery | Admitting: Vascular Surgery

## 2018-06-16 ENCOUNTER — Ambulatory Visit: Payer: Medicare Other | Admitting: Anesthesiology

## 2018-06-16 DIAGNOSIS — E782 Mixed hyperlipidemia: Secondary | ICD-10-CM | POA: Diagnosis not present

## 2018-06-16 DIAGNOSIS — E1151 Type 2 diabetes mellitus with diabetic peripheral angiopathy without gangrene: Secondary | ICD-10-CM | POA: Insufficient documentation

## 2018-06-16 DIAGNOSIS — N186 End stage renal disease: Secondary | ICD-10-CM | POA: Insufficient documentation

## 2018-06-16 DIAGNOSIS — I428 Other cardiomyopathies: Secondary | ICD-10-CM | POA: Insufficient documentation

## 2018-06-16 DIAGNOSIS — I120 Hypertensive chronic kidney disease with stage 5 chronic kidney disease or end stage renal disease: Secondary | ICD-10-CM | POA: Diagnosis not present

## 2018-06-16 DIAGNOSIS — Z992 Dependence on renal dialysis: Secondary | ICD-10-CM | POA: Diagnosis not present

## 2018-06-16 DIAGNOSIS — K219 Gastro-esophageal reflux disease without esophagitis: Secondary | ICD-10-CM | POA: Insufficient documentation

## 2018-06-16 DIAGNOSIS — Z8673 Personal history of transient ischemic attack (TIA), and cerebral infarction without residual deficits: Secondary | ICD-10-CM | POA: Insufficient documentation

## 2018-06-16 DIAGNOSIS — Z89511 Acquired absence of right leg below knee: Secondary | ICD-10-CM | POA: Insufficient documentation

## 2018-06-16 DIAGNOSIS — E1122 Type 2 diabetes mellitus with diabetic chronic kidney disease: Secondary | ICD-10-CM | POA: Diagnosis not present

## 2018-06-16 DIAGNOSIS — Z79891 Long term (current) use of opiate analgesic: Secondary | ICD-10-CM | POA: Insufficient documentation

## 2018-06-16 DIAGNOSIS — D631 Anemia in chronic kidney disease: Secondary | ICD-10-CM | POA: Insufficient documentation

## 2018-06-16 DIAGNOSIS — Z79899 Other long term (current) drug therapy: Secondary | ICD-10-CM | POA: Insufficient documentation

## 2018-06-16 DIAGNOSIS — Z794 Long term (current) use of insulin: Secondary | ICD-10-CM | POA: Insufficient documentation

## 2018-06-16 DIAGNOSIS — Z7902 Long term (current) use of antithrombotics/antiplatelets: Secondary | ICD-10-CM | POA: Insufficient documentation

## 2018-06-16 HISTORY — PX: AV FISTULA PLACEMENT: SHX1204

## 2018-06-16 LAB — GLUCOSE, CAPILLARY
Glucose-Capillary: 134 mg/dL — ABNORMAL HIGH (ref 70–99)
Glucose-Capillary: 142 mg/dL — ABNORMAL HIGH (ref 70–99)

## 2018-06-16 LAB — ABO/RH: ABO/RH(D): O POS

## 2018-06-16 LAB — POCT I-STAT 4, (NA,K, GLUC, HGB,HCT)
GLUCOSE: 150 mg/dL — AB (ref 70–99)
HCT: 55 % — ABNORMAL HIGH (ref 39.0–52.0)
HEMOGLOBIN: 18.7 g/dL — AB (ref 13.0–17.0)
Potassium: 4.5 mmol/L (ref 3.5–5.1)
Sodium: 134 mmol/L — ABNORMAL LOW (ref 135–145)

## 2018-06-16 SURGERY — ARTERIOVENOUS (AV) FISTULA CREATION
Anesthesia: General | Site: Arm Lower | Laterality: Left

## 2018-06-16 MED ORDER — PROPOFOL 10 MG/ML IV BOLUS
INTRAVENOUS | Status: DC | PRN
  Administered 2018-06-16: 100 mg via INTRAVENOUS

## 2018-06-16 MED ORDER — OXYCODONE HCL 5 MG/5ML PO SOLN: 5.0000 mg | Freq: Once | ORAL | Status: DC | PRN

## 2018-06-16 MED ORDER — PHENYLEPHRINE HCL 10 MG/ML IJ SOLN
INTRAMUSCULAR | Status: DC | PRN
  Administered 2018-06-16: 200 ug via INTRAVENOUS
  Administered 2018-06-16: 100 ug via INTRAVENOUS

## 2018-06-16 MED ORDER — EPHEDRINE SULFATE 50 MG/ML IJ SOLN
INTRAMUSCULAR | Status: DC | PRN
  Administered 2018-06-16: 10 mg via INTRAVENOUS

## 2018-06-16 MED ORDER — SUGAMMADEX SODIUM 500 MG/5ML IV SOLN
INTRAVENOUS | Status: DC | PRN
  Administered 2018-06-16: 250 mg via INTRAVENOUS

## 2018-06-16 MED ORDER — MEPERIDINE HCL 50 MG/ML IJ SOLN: 6.2500 mg | INTRAMUSCULAR | Status: DC | PRN

## 2018-06-16 MED ORDER — SODIUM CHLORIDE 0.9 % IV SOLN
INTRAVENOUS | Status: DC
  Administered 2018-06-16: 14:00:00 via INTRAVENOUS

## 2018-06-16 MED ORDER — ONDANSETRON HCL 4 MG/2ML IJ SOLN
INTRAMUSCULAR | Status: DC | PRN
  Administered 2018-06-16: 4 mg via INTRAVENOUS

## 2018-06-16 MED ORDER — CLINDAMYCIN PHOSPHATE 900 MG/50ML IV SOLN
INTRAVENOUS | Status: AC
  Filled 2018-06-16: qty 50

## 2018-06-16 MED ORDER — SODIUM CHLORIDE (PF) 0.9 % IJ SOLN
INTRAMUSCULAR | Status: AC
  Filled 2018-06-16: qty 10

## 2018-06-16 MED ORDER — EPHEDRINE SULFATE 50 MG/ML IJ SOLN
INTRAMUSCULAR | Status: AC
  Filled 2018-06-16: qty 1

## 2018-06-16 MED ORDER — SUGAMMADEX SODIUM 500 MG/5ML IV SOLN
INTRAVENOUS | Status: AC
  Filled 2018-06-16: qty 5

## 2018-06-16 MED ORDER — LIDOCAINE HCL (CARDIAC) PF 100 MG/5ML IV SOSY
PREFILLED_SYRINGE | INTRAVENOUS | Status: DC | PRN
  Administered 2018-06-16: 50 mg via INTRAVENOUS

## 2018-06-16 MED ORDER — FENTANYL CITRATE (PF) 100 MCG/2ML IJ SOLN
INTRAMUSCULAR | Status: DC | PRN
  Administered 2018-06-16: 100 ug via INTRAVENOUS

## 2018-06-16 MED ORDER — OXYCODONE HCL 5 MG PO TABS: 5.0000 mg | ORAL_TABLET | Freq: Once | ORAL | Status: DC | PRN

## 2018-06-16 MED ORDER — LIDOCAINE HCL (PF) 2 % IJ SOLN
INTRAMUSCULAR | Status: AC
  Filled 2018-06-16: qty 10

## 2018-06-16 MED ORDER — HYDROCODONE-ACETAMINOPHEN 5-325 MG PO TABS: 1.0000 | ORAL_TABLET | Freq: Four times a day (QID) | ORAL | 0 refills | Status: DC | PRN

## 2018-06-16 MED ORDER — FENTANYL CITRATE (PF) 100 MCG/2ML IJ SOLN: 25.0000 ug | INTRAMUSCULAR | Status: DC | PRN

## 2018-06-16 MED ORDER — ROCURONIUM BROMIDE 100 MG/10ML IV SOLN
INTRAVENOUS | Status: DC | PRN
  Administered 2018-06-16: 40 mg via INTRAVENOUS
  Administered 2018-06-16: 10 mg via INTRAVENOUS

## 2018-06-16 MED ORDER — PROMETHAZINE HCL 25 MG/ML IJ SOLN: 6.2500 mg | INTRAMUSCULAR | Status: DC | PRN

## 2018-06-16 MED ORDER — PROPOFOL 10 MG/ML IV BOLUS
INTRAVENOUS | Status: AC
  Filled 2018-06-16: qty 20

## 2018-06-16 MED ORDER — HEPARIN SODIUM (PORCINE) 1000 UNIT/ML IJ SOLN
INTRAMUSCULAR | Status: DC | PRN
  Administered 2018-06-16: 4000 [IU] via INTRAVENOUS

## 2018-06-16 MED ORDER — FENTANYL CITRATE (PF) 100 MCG/2ML IJ SOLN
INTRAMUSCULAR | Status: AC
  Filled 2018-06-16: qty 2

## 2018-06-16 MED ORDER — HEPARIN SODIUM (PORCINE) 1000 UNIT/ML IJ SOLN
INTRAMUSCULAR | Status: AC
  Filled 2018-06-16: qty 1

## 2018-06-16 MED ORDER — ONDANSETRON HCL 4 MG/2ML IJ SOLN
INTRAMUSCULAR | Status: AC
  Filled 2018-06-16: qty 2

## 2018-06-16 MED ORDER — SODIUM CHLORIDE 0.9 % IV SOLN
INTRAVENOUS | Status: DC | PRN
  Administered 2018-06-16: 50 ug/min via INTRAVENOUS

## 2018-06-16 SURGICAL SUPPLY — 57 items
ADH SKN CLS APL DERMABOND .7 (GAUZE/BANDAGES/DRESSINGS) ×1
BAG DECANTER FOR FLEXI CONT (MISCELLANEOUS) ×3 IMPLANT
BLADE SURG SZ11 CARB STEEL (BLADE) ×3 IMPLANT
BOOT SUTURE AID YELLOW STND (SUTURE) ×3 IMPLANT
BRUSH SCRUB EZ  4% CHG (MISCELLANEOUS) ×2
BRUSH SCRUB EZ 4% CHG (MISCELLANEOUS) ×1 IMPLANT
CANISTER SUCT 1200ML W/VALVE (MISCELLANEOUS) ×3 IMPLANT
CHLORAPREP W/TINT 26ML (MISCELLANEOUS) ×3 IMPLANT
CLIP SPRNG 6 S-JAW DBL (CLIP) ×1 IMPLANT
CLIP SPRNG 6MM S-JAW DBL (CLIP) ×3
COVER WAND RF STERILE (DRAPES) ×3 IMPLANT
DERMABOND ADVANCED (GAUZE/BANDAGES/DRESSINGS) ×2
DERMABOND ADVANCED .7 DNX12 (GAUZE/BANDAGES/DRESSINGS) ×1 IMPLANT
ELECT CAUTERY BLADE 6.4 (BLADE) ×3 IMPLANT
ELECT REM PT RETURN 9FT ADLT (ELECTROSURGICAL) ×3
ELECTRODE REM PT RTRN 9FT ADLT (ELECTROSURGICAL) ×1 IMPLANT
GEL ULTRASOUND 20GR AQUASONIC (MISCELLANEOUS) IMPLANT
GLOVE BIO SURGEON STRL SZ7 (GLOVE) ×6 IMPLANT
GLOVE INDICATOR 7.5 STRL GRN (GLOVE) ×3 IMPLANT
GOWN STRL REUS W/ TWL LRG LVL3 (GOWN DISPOSABLE) ×1 IMPLANT
GOWN STRL REUS W/ TWL XL LVL3 (GOWN DISPOSABLE) ×2 IMPLANT
GOWN STRL REUS W/TWL LRG LVL3 (GOWN DISPOSABLE) ×3
GOWN STRL REUS W/TWL XL LVL3 (GOWN DISPOSABLE) ×6
HEMOSTAT SURGICEL 2X3 (HEMOSTASIS) ×3 IMPLANT
IV NS 500ML (IV SOLUTION) ×3
IV NS 500ML BAXH (IV SOLUTION) ×1 IMPLANT
KIT TURNOVER KIT A (KITS) ×3 IMPLANT
LABEL OR SOLS (LABEL) ×3 IMPLANT
LOOP RED MAXI  1X406MM (MISCELLANEOUS) ×2
LOOP VESSEL MAXI 1X406 RED (MISCELLANEOUS) ×1 IMPLANT
LOOP VESSEL MINI 0.8X406 BLUE (MISCELLANEOUS) ×1 IMPLANT
LOOPS BLUE MINI 0.8X406MM (MISCELLANEOUS) ×2
NDL FILTER BLUNT 18X1 1/2 (NEEDLE) ×1 IMPLANT
NDL HYPO 30X.5 LL (NEEDLE) IMPLANT
NEEDLE FILTER BLUNT 18X 1/2SAF (NEEDLE) ×2
NEEDLE FILTER BLUNT 18X1 1/2 (NEEDLE) ×1 IMPLANT
NEEDLE HYPO 30X.5 LL (NEEDLE) IMPLANT
NS IRRIG 500ML POUR BTL (IV SOLUTION) ×3 IMPLANT
PACK EXTREMITY ARMC (MISCELLANEOUS) ×3 IMPLANT
PAD PREP 24X41 OB/GYN DISP (PERSONAL CARE ITEMS) ×3 IMPLANT
SOLUTION CELL SAVER (CLIP) ×1 IMPLANT
STOCKINETTE 48X4 2 PLY STRL (GAUZE/BANDAGES/DRESSINGS) ×1 IMPLANT
STOCKINETTE STRL 4IN 9604848 (GAUZE/BANDAGES/DRESSINGS) ×3 IMPLANT
SUT MNCRL AB 4-0 PS2 18 (SUTURE) ×3 IMPLANT
SUT PROLENE 6 0 BV (SUTURE) ×12 IMPLANT
SUT SILK 2 0 (SUTURE) ×3
SUT SILK 2-0 18XBRD TIE 12 (SUTURE) ×1 IMPLANT
SUT SILK 3 0 (SUTURE) ×3
SUT SILK 3-0 18XBRD TIE 12 (SUTURE) ×1 IMPLANT
SUT SILK 4 0 (SUTURE) ×3
SUT SILK 4-0 18XBRD TIE 12 (SUTURE) ×1 IMPLANT
SUT VIC AB 3-0 SH 27 (SUTURE) ×6
SUT VIC AB 3-0 SH 27X BRD (SUTURE) ×2 IMPLANT
SYR 20CC LL (SYRINGE) ×3 IMPLANT
SYR 3ML LL SCALE MARK (SYRINGE) ×3 IMPLANT
SYR TB 1ML 27GX1/2 LL (SYRINGE) IMPLANT
TOWEL OR 17X26 4PK STRL BLUE (TOWEL DISPOSABLE) IMPLANT

## 2018-06-16 NOTE — Op Note (Signed)
[facility]   OPERATIVE NOTE   PROCEDURE: Left brachiocephalic arteriovenous fistula placement  PRE-OPERATIVE DIAGNOSIS: 1.  ESRD      2. Multiple failed previous accesses  POST-OPERATIVE DIAGNOSIS: 1. ESRD     2. Multiple failed previous accesses  SURGEON: Leotis Pain, MD  ASSISTANT(S): Hezzie Bump, PA-C  ANESTHESIA: general  ESTIMATED BLOOD LOSS: 10 cc  FINDING(S): Adequate cephalic vein for fistula creation  SPECIMEN(S):  none  INDICATIONS:   Jacob [name] is a 67 y.o. male who presents with renal failure in need of pemanent dialysis acces.  The patient is scheduled for left arm AVF placement.  The patient is aware the risks include but are not limited to: bleeding, infection, steal syndrome, nerve damage, ischemic monomelic neuropathy, failure to mature, and need for additional procedures.  The patient is aware of the risks of the procedure and elects to proceed forward.  DESCRIPTION: After full informed written consent was obtained from the patient, the patient was brought back to the operating room and placed supine upon the operating table.  Prior to induction, the patient received IV antibiotics.   After obtaining adequate anesthesia, the patient was then prepped and draped in the standard fashion for a left arm access procedure.  I made a curvilinear incision at the level of the antecubital fossa and dissected through the subcutaneous tissue and fascia to gain exposure of the brachial artery.  This was noted to be patent and adequate in size for fistula creation.  This was dissected out proximally and distally and prepared for control with vessel loops .  I then dissected out the cephalic vein.  This was noted to be patent and adequate in size for fistula creation.  I then gave the patient 4000 units of intravenous heparin.  The vein was marked for orientation and the distal segment of the vein was ligated with a  2-0 silk, and the vein was transected.   I then instilled the heparinized saline into the vein and clamped it.  At this point, I reset my exposure of the brachial artery and pulled up control on the vessel loops.  I made an arteriotomy with a #11 blade, and then I extended the arteriotomy with a Potts scissor.  I injected heparinized saline proximal and distal to this arteriotomy.  The vein was then sewn to the artery in an end-to-side configuration with a running stitch of 6-0 Prolene.  Prior to completing this anastomosis, I allowed the vein and artery to backbleed.  There was no evidence of clot from any vessels.  I completed the anastomosis in the usual fashion and then released all vessel loops and clamps.  There was a palpable  thrill in the venous outflow, and there was a palpable pulse in the artery distal to the anastomosis.  At this point, I irrigated out the surgical wound.  Surgicel was placed. There was no further active bleeding.  The subcutaneous tissue was reapproximated with a running stitch of 3-0 Vicryl.  The skin was then closed with a 4-0 Monocryl suture.  The skin was then cleaned, dried, and reinforced with Dermabond.  The patient tolerated this procedure well and was taken to the recovery room in stable condition  COMPLICATIONS: None  CONDITION: Stable   Leotis Pain    06/16/2018, 4:28 PM  This note was created with Dragon Medical transcription system. Any errors in dictation are purely unintentional.

## 2018-06-16 NOTE — Anesthesia Procedure Notes (Signed)
Procedure Name: Intubation Performed by: Lance Muss, CRNA Pre-anesthesia Checklist: Patient identified, Patient being monitored, Timeout performed, Emergency Drugs available and Suction available Patient Re-evaluated:Patient Re-evaluated prior to induction Oxygen Delivery Method: Circle system utilized Preoxygenation: Pre-oxygenation with 100% oxygen Induction Type: IV induction Ventilation: Mask ventilation without difficulty and Oral airway inserted - appropriate to patient size Laryngoscope Size: McGraph and 4 Grade View: Grade I Tube type: Oral Tube size: 7.5 mm Number of attempts: 1 Airway Equipment and Method: Stylet and LTA kit utilized Placement Confirmation: ETT inserted through vocal cords under direct vision,  positive ETCO2 and breath sounds checked- equal and bilateral Secured at: 22 cm Tube secured with: Tape Dental Injury: Teeth and Oropharynx as per pre-operative assessment

## 2018-06-16 NOTE — Anesthesia Post-op Follow-up Note (Signed)
Anesthesia QCDR form completed.        

## 2018-06-16 NOTE — Anesthesia Postprocedure Evaluation (Signed)
Anesthesia Post Note  Patient: Jacob [name]  Procedure(s) Performed: ARTERIOVENOUS (AV) FISTULA CREATION ( BRACHIALCEPHALIC ) (Left Arm Lower)  Patient location during evaluation: PACU Anesthesia Type: General Level of consciousness: awake and alert Pain management: pain level controlled Vital Signs Assessment: post-procedure vital signs reviewed and stable Respiratory status: spontaneous breathing, nonlabored ventilation, respiratory function stable and patient connected to nasal cannula oxygen Cardiovascular status: blood pressure returned to baseline and stable Postop Assessment: no apparent nausea or vomiting Anesthetic complications: no     Last Vitals:  Vitals:   06/16/18 1720 06/16/18 1730  BP: (!) 107/41 (!) 104/43  Pulse: 79 75  Resp: 16 16  Temp: 36.7 C   SpO2: 96% 100%    Last Pain:  Vitals:   06/16/18 1730  TempSrc:   PainSc: 0-No pain                 Precious Haws Piscitello

## 2018-06-16 NOTE — H&P (Signed)
[facility] History & Physical Update  The patient was interviewed and re-examined.  The patient's previous History and Physical has been reviewed and is unchanged.  There is no change in the plan of care. We plan to proceed with the scheduled procedure.  Leotis Pain, MD  06/16/2018, 3:03 PM

## 2018-06-16 NOTE — Anesthesia Preprocedure Evaluation (Signed)
Anesthesia Evaluation  Patient identified by MRN, date of birth, ID band Patient awake    Reviewed: Allergy & Precautions, NPO status , Patient's Chart, lab work & pertinent test results  History of Anesthesia Complications Negative for: history of anesthetic complications  Airway Mallampati: III  TM Distance: >3 FB Neck ROM: Full    Dental  (+) Edentulous Upper, Poor Dentition, Loose   Pulmonary neg pulmonary ROS, neg sleep apnea, neg COPD,    breath sounds clear to auscultation- rhonchi (-) wheezing      Cardiovascular hypertension, Pt. on medications + Peripheral Vascular Disease  (-) CAD, (-) Past MI, (-) Cardiac Stents and (-) CABG  Rhythm:Regular Rate:Normal - Systolic murmurs and - Diastolic murmurs    Neuro/Psych PSYCHIATRIC DISORDERS Anxiety Depression CVA, No Residual Symptoms    GI/Hepatic Neg liver ROS, GERD  ,  Endo/Other  diabetes, Insulin DependentHypothyroidism   Renal/GU ESRF and DialysisRenal disease     Musculoskeletal  (+) Arthritis ,   Abdominal (+) + obese,   Peds  Hematology  (+) anemia ,   Anesthesia Other Findings Past Medical History: No date: Anemia in chronic kidney disease(285.21) No date: Anxiety No date: Arthritis No date: Depression No date: ESRD (end stage renal disease) on dialysis [facility])     Comment:  "MWF; [facility], [place]" (02/18/2017) No date: Essential hypertension No date: GERD (gastroesophageal reflux disease) No date: Glaucoma No date: History of blood transfusion 2016: History of stroke No date: Insomnia No date: Nonischemic cardiomyopathy (HCC) No date: Stroke [facility])     Comment:  mini stroke No date: Type 2 diabetes mellitus ([place])   Reproductive/Obstetrics                             Anesthesia Physical Anesthesia Plan  ASA: IV  Anesthesia Plan: General   Post-op Pain Management:    Induction: Intravenous  PONV Risk Score and Plan:  1 and Ondansetron  Airway Management Planned: Oral ETT  Additional Equipment:   Intra-op Plan:   Post-operative Plan: Extubation in OR  Informed Consent: I have reviewed the patients History and Physical, chart, labs and discussed the procedure including the risks, benefits and alternatives for the proposed anesthesia with the patient or authorized representative who has indicated his/her understanding and acceptance.   Dental advisory given  Plan Discussed with: CRNA and Anesthesiologist  Anesthesia Plan Comments:         Anesthesia Quick Evaluation

## 2018-06-16 NOTE — Transfer of Care (Signed)
Immediate Anesthesia Transfer of Care Note  Patient: Jacob [name]  Procedure(s) Performed: ARTERIOVENOUS (AV) FISTULA CREATION ( BRACHIALCEPHALIC ) (Left Arm Lower)  Patient Location: PACU  Anesthesia Type:General  Level of Consciousness: awake  Airway & Oxygen Therapy: Patient Spontanous Breathing and Patient connected to face mask oxygen  Post-op Assessment: Report given to RN and Post -op Vital signs reviewed and stable  Post vital signs: Reviewed  Last Vitals:  Vitals Value Taken Time  BP    Temp    Pulse    Resp 11 06/16/2018  4:48 PM  SpO2    Vitals shown include unvalidated device data.  Last Pain:  Vitals:   06/16/18 1304  TempSrc: Tympanic  PainSc: 0-No pain         Complications: No apparent anesthesia complications

## 2018-06-16 NOTE — Discharge Instructions (Signed)

## 2018-06-17 ENCOUNTER — Encounter: Payer: Self-pay | Admitting: Vascular Surgery

## 2018-06-17 DIAGNOSIS — Z992 Dependence on renal dialysis: Secondary | ICD-10-CM | POA: Diagnosis not present

## 2018-06-17 DIAGNOSIS — I120 Hypertensive chronic kidney disease with stage 5 chronic kidney disease or end stage renal disease: Secondary | ICD-10-CM | POA: Diagnosis not present

## 2018-06-17 DIAGNOSIS — E1122 Type 2 diabetes mellitus with diabetic chronic kidney disease: Secondary | ICD-10-CM | POA: Diagnosis not present

## 2018-06-17 DIAGNOSIS — T82868D Thrombosis of vascular prosthetic devices, implants and grafts, subsequent encounter: Secondary | ICD-10-CM | POA: Diagnosis not present

## 2018-06-17 DIAGNOSIS — D638 Anemia in other chronic diseases classified elsewhere: Secondary | ICD-10-CM | POA: Diagnosis not present

## 2018-06-17 DIAGNOSIS — D509 Iron deficiency anemia, unspecified: Secondary | ICD-10-CM | POA: Diagnosis not present

## 2018-06-17 DIAGNOSIS — N186 End stage renal disease: Secondary | ICD-10-CM | POA: Diagnosis not present

## 2018-06-17 DIAGNOSIS — N2581 Secondary hyperparathyroidism of renal origin: Secondary | ICD-10-CM | POA: Diagnosis not present

## 2018-06-19 DIAGNOSIS — Z992 Dependence on renal dialysis: Secondary | ICD-10-CM | POA: Diagnosis not present

## 2018-06-19 DIAGNOSIS — D638 Anemia in other chronic diseases classified elsewhere: Secondary | ICD-10-CM | POA: Diagnosis not present

## 2018-06-19 DIAGNOSIS — T82868D Thrombosis of vascular prosthetic devices, implants and grafts, subsequent encounter: Secondary | ICD-10-CM | POA: Diagnosis not present

## 2018-06-19 DIAGNOSIS — I120 Hypertensive chronic kidney disease with stage 5 chronic kidney disease or end stage renal disease: Secondary | ICD-10-CM | POA: Diagnosis not present

## 2018-06-19 DIAGNOSIS — N186 End stage renal disease: Secondary | ICD-10-CM | POA: Diagnosis not present

## 2018-06-19 DIAGNOSIS — E1122 Type 2 diabetes mellitus with diabetic chronic kidney disease: Secondary | ICD-10-CM | POA: Diagnosis not present

## 2018-06-20 DIAGNOSIS — N2581 Secondary hyperparathyroidism of renal origin: Secondary | ICD-10-CM | POA: Diagnosis not present

## 2018-06-20 DIAGNOSIS — Z992 Dependence on renal dialysis: Secondary | ICD-10-CM | POA: Diagnosis not present

## 2018-06-20 DIAGNOSIS — N186 End stage renal disease: Secondary | ICD-10-CM | POA: Diagnosis not present

## 2018-06-20 DIAGNOSIS — D509 Iron deficiency anemia, unspecified: Secondary | ICD-10-CM | POA: Diagnosis not present

## 2018-06-21 DIAGNOSIS — N186 End stage renal disease: Secondary | ICD-10-CM | POA: Diagnosis not present

## 2018-06-21 DIAGNOSIS — D638 Anemia in other chronic diseases classified elsewhere: Secondary | ICD-10-CM | POA: Diagnosis not present

## 2018-06-21 DIAGNOSIS — T82868D Thrombosis of vascular prosthetic devices, implants and grafts, subsequent encounter: Secondary | ICD-10-CM | POA: Diagnosis not present

## 2018-06-21 DIAGNOSIS — I120 Hypertensive chronic kidney disease with stage 5 chronic kidney disease or end stage renal disease: Secondary | ICD-10-CM | POA: Diagnosis not present

## 2018-06-21 DIAGNOSIS — Z992 Dependence on renal dialysis: Secondary | ICD-10-CM | POA: Diagnosis not present

## 2018-06-21 DIAGNOSIS — E1122 Type 2 diabetes mellitus with diabetic chronic kidney disease: Secondary | ICD-10-CM | POA: Diagnosis not present

## 2018-06-22 DIAGNOSIS — D509 Iron deficiency anemia, unspecified: Secondary | ICD-10-CM | POA: Diagnosis not present

## 2018-06-22 DIAGNOSIS — Z992 Dependence on renal dialysis: Secondary | ICD-10-CM | POA: Diagnosis not present

## 2018-06-22 DIAGNOSIS — N186 End stage renal disease: Secondary | ICD-10-CM | POA: Diagnosis not present

## 2018-06-22 DIAGNOSIS — N2581 Secondary hyperparathyroidism of renal origin: Secondary | ICD-10-CM | POA: Diagnosis not present

## 2018-06-24 DIAGNOSIS — N2581 Secondary hyperparathyroidism of renal origin: Secondary | ICD-10-CM | POA: Diagnosis not present

## 2018-06-24 DIAGNOSIS — E1122 Type 2 diabetes mellitus with diabetic chronic kidney disease: Secondary | ICD-10-CM | POA: Diagnosis not present

## 2018-06-24 DIAGNOSIS — N186 End stage renal disease: Secondary | ICD-10-CM | POA: Diagnosis not present

## 2018-06-24 DIAGNOSIS — Z992 Dependence on renal dialysis: Secondary | ICD-10-CM | POA: Diagnosis not present

## 2018-06-24 DIAGNOSIS — I120 Hypertensive chronic kidney disease with stage 5 chronic kidney disease or end stage renal disease: Secondary | ICD-10-CM | POA: Diagnosis not present

## 2018-06-24 DIAGNOSIS — T82868D Thrombosis of vascular prosthetic devices, implants and grafts, subsequent encounter: Secondary | ICD-10-CM | POA: Diagnosis not present

## 2018-06-24 DIAGNOSIS — D509 Iron deficiency anemia, unspecified: Secondary | ICD-10-CM | POA: Diagnosis not present

## 2018-06-24 DIAGNOSIS — D638 Anemia in other chronic diseases classified elsewhere: Secondary | ICD-10-CM | POA: Diagnosis not present

## 2018-06-25 ENCOUNTER — Emergency Department (HOSPITAL_COMMUNITY)
Admission: EM | Admit: 2018-06-25 | Discharge: 2018-06-25 | Disposition: A | Discharge status: Home / Self Care | Payer: Medicare Other | Attending: Emergency Medicine | Admitting: Emergency Medicine

## 2018-06-25 ENCOUNTER — Emergency Department (HOSPITAL_COMMUNITY): Payer: Medicare Other

## 2018-06-25 ENCOUNTER — Other Ambulatory Visit: Payer: Self-pay

## 2018-06-25 DIAGNOSIS — Z992 Dependence on renal dialysis: Secondary | ICD-10-CM | POA: Diagnosis not present

## 2018-06-25 DIAGNOSIS — E039 Hypothyroidism, unspecified: Secondary | ICD-10-CM | POA: Diagnosis not present

## 2018-06-25 DIAGNOSIS — Z794 Long term (current) use of insulin: Secondary | ICD-10-CM | POA: Insufficient documentation

## 2018-06-25 DIAGNOSIS — I959 Hypotension, unspecified: Secondary | ICD-10-CM | POA: Diagnosis not present

## 2018-06-25 DIAGNOSIS — Z8673 Personal history of transient ischemic attack (TIA), and cerebral infarction without residual deficits: Secondary | ICD-10-CM | POA: Diagnosis not present

## 2018-06-25 DIAGNOSIS — I428 Other cardiomyopathies: Secondary | ICD-10-CM | POA: Insufficient documentation

## 2018-06-25 DIAGNOSIS — R0602 Shortness of breath: Secondary | ICD-10-CM | POA: Diagnosis not present

## 2018-06-25 DIAGNOSIS — E860 Dehydration: Secondary | ICD-10-CM | POA: Insufficient documentation

## 2018-06-25 DIAGNOSIS — R5381 Other malaise: Secondary | ICD-10-CM | POA: Diagnosis not present

## 2018-06-25 DIAGNOSIS — Z7902 Long term (current) use of antithrombotics/antiplatelets: Secondary | ICD-10-CM | POA: Diagnosis not present

## 2018-06-25 DIAGNOSIS — I120 Hypertensive chronic kidney disease with stage 5 chronic kidney disease or end stage renal disease: Secondary | ICD-10-CM | POA: Insufficient documentation

## 2018-06-25 DIAGNOSIS — N186 End stage renal disease: Secondary | ICD-10-CM | POA: Insufficient documentation

## 2018-06-25 DIAGNOSIS — Z79891 Long term (current) use of opiate analgesic: Secondary | ICD-10-CM | POA: Insufficient documentation

## 2018-06-25 DIAGNOSIS — E1122 Type 2 diabetes mellitus with diabetic chronic kidney disease: Secondary | ICD-10-CM | POA: Diagnosis not present

## 2018-06-25 LAB — BASIC METABOLIC PANEL
ANION GAP: 16 — AB (ref 5–15)
BUN: 40 mg/dL — ABNORMAL HIGH (ref 8–23)
CO2: 19 mmol/L — ABNORMAL LOW (ref 22–32)
CREATININE: 12.13 mg/dL — AB (ref 0.61–1.24)
Calcium: 8.4 mg/dL — ABNORMAL LOW (ref 8.9–10.3)
Chloride: 96 mmol/L — ABNORMAL LOW (ref 98–111)
GFR, EST AFRICAN AMERICAN: 4 mL/min — AB (ref >60)
GFR, EST NON AFRICAN AMERICAN: 4 mL/min — AB (ref >60)
Glucose, Bld: 226 mg/dL — ABNORMAL HIGH (ref 70–99)
Potassium: 4.6 mmol/L (ref 3.5–5.1)
SODIUM: 131 mmol/L — AB (ref 135–145)

## 2018-06-25 LAB — CBC WITH DIFFERENTIAL/PLATELET
Abs Immature Granulocytes: 0.02 10*3/uL (ref 0.00–0.07)
BASOS ABS: 0.1 10*3/uL (ref 0.0–0.1)
Basophils Relative: 1 %
EOS ABS: 1 10*3/uL — AB (ref 0.0–0.5)
EOS PCT: 17 %
HCT: 52.3 % — ABNORMAL HIGH (ref 39.0–52.0)
Hemoglobin: 15.9 g/dL (ref 13.0–17.0)
IMMATURE GRANULOCYTES: 0 %
LYMPHS ABS: 1 10*3/uL (ref 0.7–4.0)
LYMPHS PCT: 17 %
MCH: 29.5 pg (ref 26.0–34.0)
MCHC: 30.4 g/dL (ref 30.0–36.0)
MCV: 97 fL (ref 80.0–100.0)
Monocytes Absolute: 0.8 10*3/uL (ref 0.1–1.0)
Monocytes Relative: 13 %
NRBC: 0 % (ref 0.0–0.2)
Neutro Abs: 3.1 10*3/uL (ref 1.7–7.7)
Neutrophils Relative %: 52 %
Platelets: 203 10*3/uL (ref 150–400)
RBC: 5.39 MIL/uL (ref 4.22–5.81)
RDW: 14.5 % (ref 11.5–15.5)
WBC: 6 10*3/uL (ref 4.0–10.5)

## 2018-06-25 NOTE — ED Provider Notes (Signed)
[facility] EMERGENCY DEPARTMENT Provider Note   CSN: [id number] Arrival date & time: 06/25/18  1522     History   Chief Complaint Chief Complaint  Patient presents with  . Hypotension    HPI Jacob [name] is a 67 y.o. male.  Patient states that his blood pressure has been a little bit low.  Also complains of shortness of breath  The history is provided by the patient. No language interpreter was used.  Illness  This is a new problem. The current episode started 12 to 24 hours ago. The problem occurs constantly. The problem has not changed since onset.Pertinent negatives include no chest pain, no abdominal pain and no headaches. Nothing aggravates the symptoms. Nothing relieves the symptoms. He has tried nothing for the symptoms. The treatment provided no relief.    Past Medical History:  Diagnosis Date  . Anemia in chronic kidney disease(285.21)   . Anxiety   . Arthritis   . Depression   . ESRD (end stage renal disease) on dialysis [facility])    "MWF; [facility] [place]" (02/18/2017)  . Essential hypertension   . GERD (gastroesophageal reflux disease)   . Glaucoma   . History of blood transfusion   . History of stroke 2016  . Insomnia   . Nonischemic cardiomyopathy ([place])   . Stroke [facility])    mini stroke  . Type 2 diabetes mellitus [facility])     Patient Active Problem List   Diagnosis Date Noted  . ESRD (end stage renal disease) ([place]) 07/17/2017  . Hypothyroidism   . Bradycardia 04/09/2017  . Hematoma of groin   . Swelling of arm 03/18/2017  . PAD (peripheral artery disease) ([place])   . Acute blood loss anemia   . Anemia of chronic disease   . DNR (do not resuscitate) 02/24/2017  . Palliative care by specialist 02/24/2017  . Normocytic anemia 02/18/2017  . Hypokalemia 12/28/2016  . Insulin-requiring or dependent type II diabetes mellitus ([place]) 12/28/2016  . Gangrene of foot ([place]) 12/28/2016  . Gangrene of right foot ([place]) 12/28/2016  . Hyperkalemia 04/05/2016  .  Inadequate dialysis 04/05/2016  . Other complications due to renal dialysis device, implant, and graft 04/28/2013  . Subclavian vein occlusion (HCC) 04/28/2013  . Preoperative cardiovascular examination 12/28/2012  . Mixed hyperlipidemia 06/16/2011  . Nonischemic cardiomyopathy ([place]) 11/20/2010  . End-stage renal disease on hemodialysis ([place]) 11/20/2010  . Essential hypertension, benign 11/20/2010    Past Surgical History:  Procedure Laterality Date  . A/V FISTULAGRAM Right 10/06/2016   Procedure: A/V Fistulagram;  Surgeon: [name], MD;  Location: [place] CV LAB;  Service: Cardiovascular;  Laterality: Right;  . A/V FISTULAGRAM Right 04/27/2017   Procedure: A/V Fistulagram;  Surgeon: [name], MD;  Location: [place] CV LAB;  Service: Cardiovascular;  Laterality: Right;  . A/V SHUNTOGRAM Right 11/17/2017   Procedure: A/V SHUNTOGRAM;  Surgeon: Waynetta Sandy, MD;  Location: [place] [facility];  Service: Cardiovascular;  Laterality: Right;  . ABDOMINAL AORTOGRAM N/A 12/30/2016   Procedure: Abdominal Aortogram;  Surgeon: Waynetta Sandy, MD;  Location: [place] CV LAB;  Service: Cardiovascular;  Laterality: N/A;  . ABDOMINAL AORTOGRAM N/A 04/08/2017   Procedure: ABDOMINAL AORTOGRAM;  Surgeon: Conrad [place], MD;  Location: [place] CV LAB;  Service: Cardiovascular;  Laterality: N/A;  . ABDOMINAL AORTOGRAM W/LOWER EXTREMITY N/A 02/23/2017   Procedure: Abdominal Aortogram w/Lower Extremity;  Surgeon: [name], MD;  Location: [place] [facility];  Service: Cardiovascular;  Laterality: N/A;  Rt. leg  . AMPUTATION Right 02/28/2017   Procedure: RIGHT BELOW KNEE AMPUTATION;  Surgeon: [name], MD;  Location: [place];  Service: Vascular;  Laterality: Right;  . AV FISTULA PLACEMENT     Hx: of  . AV FISTULA PLACEMENT Right 05/04/2013   Procedure: ARTERIOVENOUS (AV) FISTULA CREATION- RIGHT ARM;  Surgeon: Conrad Warsaw, MD;  Location: [place];  Service:  Vascular;  Laterality: Right;  Ultrasound guided  . AV FISTULA PLACEMENT Right 07/28/2016   Procedure: BRACHIOCEPHALIC ARTERIOVENOUS (AV) FISTULA CREATION;  Surgeon: [name], MD;  Location: [place];  Service: Vascular;  Laterality: Right;  . AV FISTULA PLACEMENT Left 06/16/2018   Procedure: ARTERIOVENOUS (AV) FISTULA CREATION ( BRACHIALCEPHALIC );  Surgeon: Algernon Huxley, MD;  Location: [facility];  Service: Vascular;  Laterality: Left;  . BACK SURGERY    . BELOW KNEE LEG AMPUTATION     1 PRIOR AMPUTATION ON FOOT  RT LEG/ FOOT  . CAPD INSERTION N/A 03/11/2018   Procedure: LAPAROSCOPIC INSERTION OF PERIOTONEAL DIALYSIS CATHETER;  Surgeon: Clovis Riley, MD;  Location: [place];  Service: General;  Laterality: N/A;  . CATARACT EXTRACTION W/PHACO Left 07/16/2014   Procedure: CATARACT EXTRACTION PHACO AND INTRAOCULAR LENS PLACEMENT ([place]);  Surgeon: [name], MD;  Location: [facility];  Service: Ophthalmology;  Laterality: Left;  CDE:8.86  . CATARACT EXTRACTION W/PHACO Right 07/30/2014   Procedure: CATARACT EXTRACTION PHACO AND INTRAOCULAR LENS PLACEMENT (IOC);  Surgeon: [name], MD;  Location: [facility];  Service: Ophthalmology;  Laterality: Right;  CDE 8.99  . COLONOSCOPY     Hx: of  . EYE SURGERY     bilateral cataract  . FISTULA SUPERFICIALIZATION Right 07/28/2016   Procedure: FISTULA SUPERFICIALIZATION;  Surgeon: [name], MD;  Location: [place];  Service: Vascular;  Laterality: Right;  . FISTULA SUPERFICIALIZATION Right 10/13/2016   Procedure: BRACHIOCEPHALIC ARTERIOVENOUS FISTULA SUPERFICIALIZATION;  Surgeon: [name], MD;  Location: [place];  Service: Vascular;  Laterality: Right;  . FISTULOGRAM N/A 05/04/2013   Procedure: CENTRAL VENOGRAM;  Surgeon: Conrad [place]ville, MD;  Location: [place];  Service: Vascular;  Laterality: N/A;  . INSERTION OF DIALYSIS CATHETER Right 05/04/2013   Procedure: INSERTION OF DIALYSIS CATHETER;  Surgeon: Conrad [place]ac[place], MD;  Location: [place];   Service: Vascular;  Laterality: Right;  Ultrasound guided  . INSERTION OF DIALYSIS CATHETER N/A 07/28/2016   Procedure: INSERTION OF DIALYSIS CATHETER;  Surgeon: [name], MD;  Location: [place];  Service: Vascular;  Laterality: N/A;  . INSERTION OF DIALYSIS CATHETER Right 03/07/2018   Procedure: INSERTION OF DIALYSIS CATHETER;  Surgeon: Conrad Milford, MD;  Location: [place];  Service: Vascular;  Laterality: Right;  . IR FLUORO GUIDE CV LINE RIGHT  04/13/2017  . IR FLUORO GUIDE CV LINE RIGHT  05/19/2018  . IR THROMBECTOMY AV FISTULA W/THROMBOLYSIS/PTA/STENT INC/SHUNT/IMG RT Right 09/07/2017  . IR US GUIDE VASC ACCESS RIGHT  04/13/2017  . IR US GUIDE VASC ACCESS RIGHT  09/07/2017  . LIGATION OF ARTERIOVENOUS  FISTULA Left 05/04/2013   Procedure: LIGATION OF LEFT RADIAL CEPHALIC ARTERIOVENOUS  FISTULA;  Surgeon: Conrad Sutton, MD;  Location: [place];  Service: Vascular;  Laterality: Left;  Ultrasound guided  . LIGATION OF ARTERIOVENOUS  FISTULA Right 07/28/2016   Procedure: LIGATION OF RADIOCEPHALIC ARTERIOVENOUS  FISTULA;  Surgeon: [name], MD;  Location: [place];  Service: Vascular;  Laterality: Right;  . LOWER EXTREMITY ANGIOGRAPHY N/A 12/30/2016  Procedure: Lower Extremity Angiography;  Surgeon: Waynetta Sandy, MD;  Location: [place] CV [facility];  Service: Cardiovascular;  Laterality: N/A;  . LUMBAR SPINE SURGERY    . PERIPHERAL VASCULAR ATHERECTOMY Right 12/30/2016   Procedure: Peripheral Vascular Atherectomy;  Surgeon: Waynetta Sandy, MD;  Location: [place] [facility];  Service: Cardiovascular;  Laterality: Right;  AT and PT  . PERIPHERAL VASCULAR BALLOON ANGIOPLASTY Right 12/30/2016   Procedure: Peripheral Vascular Balloon Angioplasty;  Surgeon: Waynetta Sandy, MD;  Location: [place] [facility];  Service: Cardiovascular;  Laterality: Right;  PTA of PT and AT  . PERIPHERAL VASCULAR BALLOON ANGIOPLASTY  02/23/2017   Procedure: Peripheral Vascular Balloon  Angioplasty;  Surgeon: [name], MD;  Location: [place] [facility];  Service: Cardiovascular;;  RT. Anterior Tib.  [name] PERIPHERAL VASCULAR BALLOON ANGIOPLASTY  04/08/2017   Procedure: PERIPHERAL VASCULAR BALLOON ANGIOPLASTY;  Surgeon: Conrad Hardin, MD;  Location: [place] CV LAB;  Service: Cardiovascular;;  . PERIPHERAL VASCULAR CATHETERIZATION N/A 01/24/2015   Procedure: Fistulagram;  Surgeon: Conrad Ge[place]a, MD;  Location: [place] [facility];  Service: Cardiovascular;  Laterality: N/A;  . PERIPHERAL VASCULAR CATHETERIZATION Right 06/04/2016   Procedure: A/V Shuntogram/Fistulagram;  Surgeon: Conrad Romeville, MD;  Location: [place] [facility];  Service: Cardiovascular;  Laterality: Right;  . REVISON OF ARTERIOVENOUS FISTULA Right 01/02/2014   Procedure: REVISON OF ARTERIOVENOUS FISTULA ANASTOMOSIS;  Surgeon: Conrad Tangerine, MD;  Location: [place];  Service: Vascular;  Laterality: Right;  . REVISON OF ARTERIOVENOUS FISTULA Right 01/02/2016   Procedure: REVISION OF RADIOCEPHALIC ARTERIOVENOUS FISTULA  with BOVINE PATCH ANGIOPLASTY RIGHT RADIAL ARTERY;  Surgeon: Mal [name], MD;  Location: [place];  Service: Vascular;  Laterality: Right;  . SHUNTOGRAM N/A 11/07/2013   Procedure: Fistulogram;  Surgeon: [name], MD;  Location: [facility] CATH LAB;  Service: Cardiovascular;  Laterality: N/A;  . THROMBECTOMY AND REVISION OF ARTERIOVENTOUS (AV) GORETEX  GRAFT Right 01/20/2018   Procedure: THROMBECTOMY  OF ARTERIOVENTOUS (AV)  HERO GRAFT;  Surgeon: [name], MD;  Location: [place];  Service: Vascular;  Laterality: Right;  . THROMBECTOMY W/ EMBOLECTOMY Right 07/18/2017   Procedure: THROMBECTOMY ARTERIOVENOUS HERO GRAFT ARM;  Surgeon: [name], MD;  Location: [place];  Service: Vascular;  Laterality: Right;  . THROMBECTOMY W/ EMBOLECTOMY Right 03/07/2018   Procedure: THROMBECTOMY OF HERO GRAFT; POSSIBLE REMOVAL;  Surgeon: Conrad Lowry, MD;  Location: [place];  Service: Vascular;  Laterality: Right;  .  TRANSMETATARSAL AMPUTATION Right 01/01/2017   Procedure: TRANSMETATARSAL AMPUTATION;  Surgeon: [name], MD;  Location: [place];  Service: Vascular;  Laterality: Right;  [name] VASCULAR ACCESS DEVICE INSERTION Right 05/13/2017   Procedure: INSERTION OF HERO VASCULAR ACCESS DEVICE RIGHT UPPER ARM;  Surgeon: Waynetta Sandy, MD;  Location: [place];  Service: Vascular;  Laterality: Right;  . VENOGRAM N/A 05/13/2017   Procedure: CENTRAL VENOGRAM;  Surgeon: Waynetta Sandy, MD;  Location: [place];  Service: Vascular;  Laterality: N/A;        Home Medications    Prior to Admission medications   Medication Sig Start Date End Date Taking? Authorizing Provider  acetaminophen (TYLENOL) 325 MG tablet Take 650 mg by mouth every 6 (six) hours as needed for mild pain. Do not exceed 4 gms of tylenol in 24 hours    [provider]  atorvastatin (LIPITOR) 40 MG tablet Take 40 mg by mouth daily.    [provider]  B Complex-C-Folic Acid (DIALYVITE PO) Take 1 tablet by mouth every Monday, Wednesday, and Friday.    [provider]  calcium acetate (PHOSLO) 667 MG capsule Take 667-2,001 mg by mouth See admin instructions. Take 3 capsules (2001mg ) three times daily with a meal and 1 capsule (667mg ) with a snack. 11/04/17   [provider]  carvedilol (COREG) 12.5 MG tablet Take 1 tablet (12.5 mg total) by mouth See admin instructions. Take 1 tablet (12.5 mg) by mouth twice daily on Sunday, Tuesday, Thursday, Saturday (hold on dialysis days) 04/10/17   Madera, Carlos, MD  cinacalcet (SENSIPAR) 30 MG tablet Take 30 mg by mouth daily with breakfast.     [provider]  clopidogrel (PLAVIX) 75 MG tablet  03/11/18   [provider]  colchicine 0.6 MG tablet Take by mouth. 05/19/11   [provider]  diclofenac sodium (VOLTAREN) 1 % GEL Apply 2 g topically 4 (four) times daily as needed (pain).  11/24/17   [provider]  EPINEPHrine  (EPIPEN 2-PAK) 0.3 mg/0.3 mL IJ SOAJ injection Inject 0.3 mg into the muscle once as needed (anaphylaxis).    [provider]  HYDROcodone-acetaminophen (NORCO) 5-325 MG tablet Take 1 tablet by mouth every 6 (six) hours as needed for moderate pain. 06/16/18   Dew, Jason S, MD  LANTUS SOLOSTAR 100 UNIT/ML Solostar Pen Inject 15 Units into the skin at bedtime. 03/02/17   Short, Mackenzie, MD  omeprazole (PRILOSEC) 40 MG capsule Take 40 mg by mouth daily.    [provider]  ramipril (ALTACE) 10 MG capsule Take 10 mg by mouth 2 (two) times daily. 10/07/17   [provider]  sevelamer carbonate (RENVELA) 800 MG tablet Take by mouth. 05/19/11   [provider]  traMADol (ULTRAM) 50 MG tablet Take by mouth. 05/19/11   [provider]    Family History Family History  Problem Relation Age of Onset  . Heart attack Brother        67  . Heart disease Brother        before age 60  . Hypertension Brother   . Heart attack Brother        62  . Heart attack Brother        65   . Diabetes Mother   . Hypertension Mother   . Heart disease Mother   . Heart disease Father   . Hypertension Father   . Other Father        amputation  . Hypertension Sister   . Heart disease Sister   . Vision loss Maternal Uncle     Social History Social History   Tobacco Use  . Smoking status: Never Smoker  . Smokeless tobacco: Never Used  Substance Use Topics  . Alcohol use: Yes    Comment: 1- fifth of gin a week  . Drug use: No     Allergies   Bee venom; Iodine; Penicillins; Plavix [clopidogrel]; and Sulfadiazine   Review of Systems Review of Systems  Constitutional: Positive for fatigue. Negative for appetite change.  HENT: Negative for congestion, ear discharge and sinus pressure.   Eyes: Negative for discharge.  Respiratory: Negative for cough.   Cardiovascular: Negative for chest pain.  Gastrointestinal: Negative for abdominal pain and diarrhea.    Genitourinary: Negative for frequency and hematuria.  Musculoskeletal: Negative for back pain.  Skin: Negative for rash.  Neurological: Negative for seizures and headaches.  Psychiatric/Behavioral: Negative for hallucinations.     Physical Exam  Updated Vital Signs BP 118/66 (BP Location: Right Arm)   Pulse 81   Temp (!) 97 F (36.1 C) (Temporal)   Resp 18   Ht 6\' 2"  (1.88 m)   Wt 120.2 kg   SpO2 99%   BMI 34.02 kg/m   Physical Exam  Constitutional: He is oriented to person, place, and time. He appears well-developed.  HENT:  Head: Normocephalic.  Eyes: Conjunctivae and EOM are normal. No scleral icterus.  Neck: Neck supple. No thyromegaly present.  Cardiovascular: Normal rate and regular rhythm. Exam reveals no gallop and no friction rub.  No murmur heard. Pulmonary/Chest: No stridor. He has no wheezes. He has no rales. He exhibits no tenderness.  Abdominal: He exhibits no distension. There is no tenderness. There is no rebound.  Musculoskeletal: Normal range of motion. He exhibits no edema.  Lymphadenopathy:    He has no cervical adenopathy.  Neurological: He is oriented to person, place, and time. He exhibits normal muscle tone. Coordination normal.  Skin: No rash noted. No erythema.  Psychiatric: He has a normal mood and affect. His behavior is normal.     ED Treatments / Results  Labs (all labs ordered are listed, but only abnormal results are displayed) Labs Reviewed  CBC WITH DIFFERENTIAL/PLATELET - Abnormal; Notable for the following components:      Result Value   HCT 52.3 (*)    Eosinophils Absolute 1.0 (*)    All other components within normal limits  BASIC METABOLIC PANEL - Abnormal; Notable for the following components:   Sodium 131 (*)    Chloride 96 (*)    CO2 19 (*)    Glucose, Bld 226 (*)    BUN 40 (*)    Creatinine, Ser 12.13 (*)    Calcium 8.4 (*)    GFR calc non Af Amer 4 (*)    GFR calc Af Amer 4 (*)    Anion gap 16 (*)    All other  components within normal limits    EKG EKG Interpretation  Date/Time:  Saturday June 25 2018 15:54:28 EST Ventricular Rate:  83 PR Interval:    QRS Duration: 120 QT Interval:  400 QTC Calculation: 470 R Axis:   -94 Text Interpretation:  sSinus rhythm Prolonged PR interval Left anterior fascicular block Anterolateral infarct, old ST elevation, consider inferior injury Confirmed by Milton Ferguson [phone]) on 06/25/2018 3:57:52 PM   Radiology Dg Chest 2 View  Result Date: 06/25/2018 CLINICAL DATA:  Low blood pressure for 2 weeks. Generalized weakness and shortness of breath. EXAM: CHEST - 2 VIEW COMPARISON:  03/07/2018 and 11/15/2017. FINDINGS: Right IJ dialysis catheters appear unchanged at the level of the mid right atrium. The heart size and mediastinal contours are stable. Left subclavian vascular stent noted. There is interval improved aeration of the lung bases with minimal residual left basilar atelectasis. There is no confluent airspace opacity, pleural effusion or pneumothorax. The bones appear unchanged. IMPRESSION: No active cardiopulmonary process. Electronically Signed   By: Richardean Sale M.D.   On: 06/25/2018 16:39    Procedures Procedures (including critical care time)  Medications Ordered in ED Medications - No data to display   Initial Impression / Assessment and Plan / ED Course  I have reviewed the triage vital signs and the nursing notes.  Pertinent labs & imaging results that were available during my care of the patient were reviewed by me and considered in my medical decision making (see chart for details).   Patient is  mildly orthostatic.  Labs and chest x-ray are unchanged.  Patient was instructed to drink an additional 12 ounces of fluid today.  And discuss with the dialysis center on Monday that he may have had too much fluid taken off Friday.    Final Clinical Impressions(s) / ED Diagnoses   Final diagnoses:  Dehydration    ED Discharge Orders     None       Milton Ferguson, MD 06/25/18 1652

## 2018-06-25 NOTE — Discharge Instructions (Addendum)
Drink at least 12 ounces of extra fluid today.  Rest at home over the weekend.  When you go for your dialysis on Monday tell them that we felt like he had a little too much fluid taken off last time he had dialysis

## 2018-06-25 NOTE — ED Triage Notes (Signed)
Pt states took b/p at home and it was 80/54, reports low b/p x 2 weeks. Pt reports generalized weakness, SHOB. Pt on dialysis via [facility], last done yesterday. Pt prescribed HTN meds but has not been taking them recently. Pt denies recent illness or infection

## 2018-06-27 DIAGNOSIS — Z992 Dependence on renal dialysis: Secondary | ICD-10-CM | POA: Diagnosis not present

## 2018-06-27 DIAGNOSIS — N2581 Secondary hyperparathyroidism of renal origin: Secondary | ICD-10-CM | POA: Diagnosis not present

## 2018-06-27 DIAGNOSIS — N186 End stage renal disease: Secondary | ICD-10-CM | POA: Diagnosis not present

## 2018-06-27 DIAGNOSIS — D509 Iron deficiency anemia, unspecified: Secondary | ICD-10-CM | POA: Diagnosis not present

## 2018-06-28 DIAGNOSIS — D638 Anemia in other chronic diseases classified elsewhere: Secondary | ICD-10-CM | POA: Diagnosis not present

## 2018-06-28 DIAGNOSIS — I120 Hypertensive chronic kidney disease with stage 5 chronic kidney disease or end stage renal disease: Secondary | ICD-10-CM | POA: Diagnosis not present

## 2018-06-28 DIAGNOSIS — Z992 Dependence on renal dialysis: Secondary | ICD-10-CM | POA: Diagnosis not present

## 2018-06-28 DIAGNOSIS — N186 End stage renal disease: Secondary | ICD-10-CM | POA: Diagnosis not present

## 2018-06-28 DIAGNOSIS — E1122 Type 2 diabetes mellitus with diabetic chronic kidney disease: Secondary | ICD-10-CM | POA: Diagnosis not present

## 2018-06-28 DIAGNOSIS — T82868D Thrombosis of vascular prosthetic devices, implants and grafts, subsequent encounter: Secondary | ICD-10-CM | POA: Diagnosis not present

## 2018-06-29 DIAGNOSIS — D509 Iron deficiency anemia, unspecified: Secondary | ICD-10-CM | POA: Diagnosis not present

## 2018-06-29 DIAGNOSIS — Z992 Dependence on renal dialysis: Secondary | ICD-10-CM | POA: Diagnosis not present

## 2018-06-29 DIAGNOSIS — N186 End stage renal disease: Secondary | ICD-10-CM | POA: Diagnosis not present

## 2018-06-29 DIAGNOSIS — N2581 Secondary hyperparathyroidism of renal origin: Secondary | ICD-10-CM | POA: Diagnosis not present

## 2018-07-01 DIAGNOSIS — N186 End stage renal disease: Secondary | ICD-10-CM | POA: Diagnosis not present

## 2018-07-01 DIAGNOSIS — Z992 Dependence on renal dialysis: Secondary | ICD-10-CM | POA: Diagnosis not present

## 2018-07-01 DIAGNOSIS — N2581 Secondary hyperparathyroidism of renal origin: Secondary | ICD-10-CM | POA: Diagnosis not present

## 2018-07-01 DIAGNOSIS — D509 Iron deficiency anemia, unspecified: Secondary | ICD-10-CM | POA: Diagnosis not present

## 2018-07-01 DIAGNOSIS — Z1159 Encounter for screening for other viral diseases: Secondary | ICD-10-CM | POA: Diagnosis not present

## 2018-07-02 DIAGNOSIS — N186 End stage renal disease: Secondary | ICD-10-CM | POA: Diagnosis not present

## 2018-07-02 DIAGNOSIS — Z992 Dependence on renal dialysis: Secondary | ICD-10-CM | POA: Diagnosis not present

## 2018-07-04 DIAGNOSIS — Z992 Dependence on renal dialysis: Secondary | ICD-10-CM | POA: Diagnosis not present

## 2018-07-04 DIAGNOSIS — N186 End stage renal disease: Secondary | ICD-10-CM | POA: Diagnosis not present

## 2018-07-04 DIAGNOSIS — D509 Iron deficiency anemia, unspecified: Secondary | ICD-10-CM | POA: Diagnosis not present

## 2018-07-04 DIAGNOSIS — N2581 Secondary hyperparathyroidism of renal origin: Secondary | ICD-10-CM | POA: Diagnosis not present

## 2018-07-05 ENCOUNTER — Encounter (INDEPENDENT_AMBULATORY_CARE_PROVIDER_SITE_OTHER): Payer: Self-pay | Admitting: Vascular Surgery

## 2018-07-05 ENCOUNTER — Ambulatory Visit (INDEPENDENT_AMBULATORY_CARE_PROVIDER_SITE_OTHER): Payer: Medicare Other | Admitting: Vascular Surgery

## 2018-07-05 VITALS — BP 133/66 | HR 76 | Resp 16 | Wt 274.0 lb

## 2018-07-05 DIAGNOSIS — D638 Anemia in other chronic diseases classified elsewhere: Secondary | ICD-10-CM | POA: Diagnosis not present

## 2018-07-05 DIAGNOSIS — I739 Peripheral vascular disease, unspecified: Secondary | ICD-10-CM

## 2018-07-05 DIAGNOSIS — Z794 Long term (current) use of insulin: Secondary | ICD-10-CM

## 2018-07-05 DIAGNOSIS — E119 Type 2 diabetes mellitus without complications: Secondary | ICD-10-CM

## 2018-07-05 DIAGNOSIS — I10 Essential (primary) hypertension: Secondary | ICD-10-CM

## 2018-07-05 DIAGNOSIS — E1122 Type 2 diabetes mellitus with diabetic chronic kidney disease: Secondary | ICD-10-CM | POA: Diagnosis not present

## 2018-07-05 DIAGNOSIS — N186 End stage renal disease: Secondary | ICD-10-CM

## 2018-07-05 DIAGNOSIS — T82868D Thrombosis of vascular prosthetic devices, implants and grafts, subsequent encounter: Secondary | ICD-10-CM | POA: Diagnosis not present

## 2018-07-05 DIAGNOSIS — Z992 Dependence on renal dialysis: Secondary | ICD-10-CM | POA: Diagnosis not present

## 2018-07-05 DIAGNOSIS — I120 Hypertensive chronic kidney disease with stage 5 chronic kidney disease or end stage renal disease: Secondary | ICD-10-CM | POA: Diagnosis not present

## 2018-07-05 NOTE — Assessment & Plan Note (Signed)
His fistula still several weeks from being used.  We can recheck this in about 2 to 3 months with a duplex in the office. He wants to have his PD catheter removed and I think that is reasonable if he is not using it anymore.  This can be done as an outpatient procedure in the near future at his convenience.

## 2018-07-05 NOTE — Progress Notes (Signed)
Patient ID: Jacob [name], male   DOB: October 18, 1950, 67 y.o.   MRN: [id number]  Chief Complaint  Patient presents with  . Follow-up    discuss pd cath removal    HPI Jacob [name] is a 67 y.o. male.  Patient returns in follow-up a few weeks after left brachiocephalic AV fistula creation.  His incision is healing well.  He does have some mild swelling and prominent varicosities in the left arm, but his pain is mild.  The vein is not yet visible but a soft thrill is present.  He is also interested in having his peritoneal dialysis catheter removed.  He is currently getting dialysis through a [facility] on Mondays, Wednesdays, and Fridays.  No fevers or chills.   Past Medical History:  Diagnosis Date  . Anemia in chronic kidney disease(285.21)   . Anxiety   . Arthritis   . Depression   . ESRD (end stage renal disease) on dialysis [facility])    "MWF; [facility] [place]" (02/18/2017)  . Essential hypertension   . GERD (gastroesophageal reflux disease)   . Glaucoma   . History of blood transfusion   . History of stroke 2016  . Insomnia   . Nonischemic cardiomyopathy ([place])   . Stroke [facility])    mini stroke  . Type 2 diabetes mellitus ([place])     Past Surgical History:  Procedure Laterality Date  . A/V FISTULAGRAM Right 10/06/2016   Procedure: A/V Fistulagram;  Surgeon: [name], MD;  Location: [place] [facility];  Service: Cardiovascular;  Laterality: Right;  . A/V FISTULAGRAM Right 04/27/2017   Procedure: A/V Fistulagram;  Surgeon: [name], MD;  Location: [place] [facility];  Service: Cardiovascular;  Laterality: Right;  . A/V SHUNTOGRAM Right 11/17/2017   Procedure: A/V SHUNTOGRAM;  Surgeon: Waynetta Sandy, MD;  Location: [place] [facility];  Service: Cardiovascular;  Laterality: Right;  . ABDOMINAL AORTOGRAM N/A 12/30/2016   Procedure: Abdominal Aortogram;  Surgeon: Waynetta Sandy, MD;  Location: [place] [facility];  Service: Cardiovascular;  Laterality: N/A;  .  ABDOMINAL AORTOGRAM N/A 04/08/2017   Procedure: ABDOMINAL AORTOGRAM;  Surgeon: Conrad [place], MD;  Location: [place] [facility];  Service: Cardiovascular;  Laterality: N/A;  . ABDOMINAL AORTOGRAM W/LOWER EXTREMITY N/A 02/23/2017   Procedure: Abdominal Aortogram w/Lower Extremity;  Surgeon: [name], MD;  Location: [place] [facility];  Service: Cardiovascular;  Laterality: N/A;  Rt. leg  . AMPUTATION Right 02/28/2017   Procedure: RIGHT BELOW KNEE AMPUTATION;  Surgeon: [name], MD;  Location: [place];  Service: Vascular;  Laterality: Right;  . AV FISTULA PLACEMENT     Hx: of  . AV FISTULA PLACEMENT Right 05/04/2013   Procedure: ARTERIOVENOUS (AV) FISTULA CREATION- RIGHT ARM;  Surgeon: Conrad [place], MD;  Location: [place];  Service: Vascular;  Laterality: Right;  Ultrasound guided  . AV FISTULA PLACEMENT Right 07/28/2016   Procedure: BRACHIOCEPHALIC ARTERIOVENOUS (AV) FISTULA CREATION;  Surgeon: [name], MD;  Location: [place];  Service: Vascular;  Laterality: Right;  . AV FISTULA PLACEMENT Left 06/16/2018   Procedure: ARTERIOVENOUS (AV) FISTULA CREATION ( BRACHIALCEPHALIC );  Surgeon: Algernon Huxley, MD;  Location: [facility];  Service: Vascular;  Laterality: Left;  . BACK SURGERY    . BELOW KNEE LEG AMPUTATION     1 PRIOR AMPUTATION ON FOOT  RT LEG/ FOOT  . CAPD INSERTION N/A 03/11/2018   Procedure: LAPAROSCOPIC INSERTION OF PERIOTONEAL DIALYSIS  CATHETER;  Surgeon: Clovis Riley, MD;  Location: [place];  Service: General;  Laterality: N/A;  . CATARACT EXTRACTION W/PHACO Left 07/16/2014   Procedure: CATARACT EXTRACTION PHACO AND INTRAOCULAR LENS PLACEMENT ([place]);  Surgeon: [name], MD;  Location: [facility];  Service: Ophthalmology;  Laterality: Left;  CDE:8.86  . CATARACT EXTRACTION W/PHACO Right 07/30/2014   Procedure: CATARACT EXTRACTION PHACO AND INTRAOCULAR LENS PLACEMENT (I[facility]);  Surgeon: [name], MD;  Location: [facility];  Service: Ophthalmology;  Laterality: Right;  CDE 8.99  .  COLONOSCOPY     Hx: of  . EYE SURGERY     bilateral cataract  . FISTULA SUPERFICIALIZATION Right 07/28/2016   Procedure: FISTULA SUPERFICIALIZATION;  Surgeon: [name], MD;  Location: [place];  Service: Vascular;  Laterality: Right;  . FISTULA SUPERFICIALIZATION Right 10/13/2016   Procedure: BRACHIOCEPHALIC ARTERIOVENOUS FISTULA SUPERFICIALIZATION;  Surgeon: [name], MD;  Location: [place];  Service: Vascular;  Laterality: Right;  . FISTULOGRAM N/A 05/04/2013   Procedure: CENTRAL VENOGRAM;  Surgeon: Conrad Yacolt, MD;  Location: [place];  Service: Vascular;  Laterality: N/A;  . INSERTION OF DIALYSIS CATHETER Right 05/04/2013   Procedure: INSERTION OF DIALYSIS CATHETER;  Surgeon: Conrad K[place]ton, MD;  Location: [place];  Service: Vascular;  Laterality: Right;  Ultrasound guided  . INSERTION OF DIALYSIS CATHETER N/A 07/28/2016   Procedure: INSERTION OF DIALYSIS CATHETER;  Surgeon: [name], MD;  Location: [place];  Service: Vascular;  Laterality: N/A;  . INSERTION OF DIALYSIS CATHETER Right 03/07/2018   Procedure: INSERTION OF DIALYSIS CATHETER;  Surgeon: Conrad Knox, MD;  Location: [place];  Service: Vascular;  Laterality: Right;  . IR FLUORO GUIDE CV LINE RIGHT  04/13/2017  . IR FLUORO GUIDE CV LINE RIGHT  05/19/2018  . IR THROMBECTOMY AV FISTULA W/THROMBOLYSIS/PTA/STENT INC/SHUNT/IMG RT Right 09/07/2017  . IR US GUIDE VASC ACCESS RIGHT  04/13/2017  . IR US GUIDE VASC ACCESS RIGHT  09/07/2017  . LIGATION OF ARTERIOVENOUS  FISTULA Left 05/04/2013   Procedure: LIGATION OF LEFT RADIAL CEPHALIC ARTERIOVENOUS  FISTULA;  Surgeon: Conrad [place], MD;  Location: [facility];  Service: Vascular;  Laterality: Left;  Ultrasound guided  . LIGATION OF ARTERIOVENOUS  FISTULA Right 07/28/2016   Procedure: LIGATION OF RADIOCEPHALIC ARTERIOVENOUS  FISTULA;  Surgeon: [name], MD;  Location: [place];  Service: Vascular;  Laterality: Right;  . LOWER EXTREMITY ANGIOGRAPHY N/A 12/30/2016    Procedure: Lower Extremity Angiography;  Surgeon: Waynetta Sandy, MD;  Location: [place] [facility];  Service: Cardiovascular;  Laterality: N/A;  . LUMBAR SPINE SURGERY    . PERIPHERAL VASCULAR ATHERECTOMY Right 12/30/2016   Procedure: Peripheral Vascular Atherectomy;  Surgeon: Waynetta Sandy, MD;  Location: [place] [facility];  Service: Cardiovascular;  Laterality: Right;  AT and PT  . PERIPHERAL VASCULAR BALLOON ANGIOPLASTY Right 12/30/2016   Procedure: Peripheral Vascular Balloon Angioplasty;  Surgeon: Waynetta Sandy, MD;  Location: [place] [facility];  Service: Cardiovascular;  Laterality: Right;  PTA of PT and AT  . PERIPHERAL VASCULAR BALLOON ANGIOPLASTY  02/23/2017   Procedure: Peripheral Vascular Balloon Angioplasty;  Surgeon: [name], MD;  Location: [place] [facility];  Service: Cardiovascular;;  RT. Anterior Tib.  [name] PERIPHERAL VASCULAR BALLOON ANGIOPLASTY  04/08/2017   Procedure: PERIPHERAL VASCULAR BALLOON ANGIOPLASTY;  Surgeon: Conrad , MD;  Location: [place] [facility];  Service: Cardiovascular;;  . PERIPHERAL VASCULAR CATHETERIZATION N/A 01/24/2015   Procedure: Fistulagram;  Surgeon:  Conrad [place]onville, MD;  Location: [place] [facility];  Service: Cardiovascular;  Laterality: N/A;  . PERIPHERAL VASCULAR CATHETERIZATION Right 06/04/2016   Procedure: A/V Shuntogram/Fistulagram;  Surgeon: Conrad Graham, MD;  Location: [place] [facility];  Service: Cardiovascular;  Laterality: Right;  . REVISON OF ARTERIOVENOUS FISTULA Right 01/02/2014   Procedure: REVISON OF ARTERIOVENOUS FISTULA ANASTOMOSIS;  Surgeon: Conrad Camano, MD;  Location: [place];  Service: Vascular;  Laterality: Right;  . REVISON OF ARTERIOVENOUS FISTULA Right 01/02/2016   Procedure: REVISION OF RADIOCEPHALIC ARTERIOVENOUS FISTULA  with BOVINE PATCH ANGIOPLASTY RIGHT RADIAL ARTERY;  Surgeon: Mal [name], MD;  Location: [place];  Service: Vascular;  Laterality: Right;  . SHUNTOGRAM N/A 11/07/2013    Procedure: Fistulogram;  Surgeon: [name], MD;  Location: [facility];  Service: Cardiovascular;  Laterality: N/A;  . THROMBECTOMY AND REVISION OF ARTERIOVENTOUS (AV) GORETEX  GRAFT Right 01/20/2018   Procedure: THROMBECTOMY  OF ARTERIOVENTOUS (AV)  HERO GRAFT;  Surgeon: [name], MD;  Location: [place];  Service: Vascular;  Laterality: Right;  . THROMBECTOMY W/ EMBOLECTOMY Right 07/18/2017   Procedure: THROMBECTOMY ARTERIOVENOUS HERO GRAFT ARM;  Surgeon: [name], MD;  Location: [place];  Service: Vascular;  Laterality: Right;  . THROMBECTOMY W/ EMBOLECTOMY Right 03/07/2018   Procedure: THROMBECTOMY OF HERO GRAFT; POSSIBLE REMOVAL;  Surgeon: Conrad Wardell, MD;  Location: [place];  Service: Vascular;  Laterality: Right;  . TRANSMETATARSAL AMPUTATION Right 01/01/2017   Procedure: TRANSMETATARSAL AMPUTATION;  Surgeon: [name], MD;  Location: [place];  Service: Vascular;  Laterality: Right;  [name] VASCULAR ACCESS DEVICE INSERTION Right 05/13/2017   Procedure: INSERTION OF HERO VASCULAR ACCESS DEVICE RIGHT UPPER ARM;  Surgeon: Waynetta Sandy, MD;  Location: [place];  Service: Vascular;  Laterality: Right;  . VENOGRAM N/A 05/13/2017   Procedure: CENTRAL VENOGRAM;  Surgeon: Waynetta Sandy, MD;  Location: [facility];  Service: Vascular;  Laterality: N/A;      Allergies  Allergen Reactions  . Bee Venom Anaphylaxis  . Iodine Swelling  . Penicillins Swelling and Other (See Comments)    SWELLING REACTION UNSPECIFIED   Has patient had a PCN reaction causing immediate rash, facial/tongue/throat swelling, SOB or lightheadedness with hypotension: No Has patient had a PCN reaction causing severe rash involving mucus membranes or skin necrosis: No Has patient had a PCN reaction that required hospitalization No Has patient had a PCN reaction occurring within the last 10 years: No If all of the above answers are "NO", then may proceed with Cephalosporin use.  [name] Plavix  [Clopidogrel] Other (See Comments)    Caused Nose bleed  . Sulfadiazine Other (See Comments)    1% Silver Sulfadiazine cream causes burning over a large area of skin.    Current Outpatient Medications  Medication Sig Dispense Refill  . acetaminophen (TYLENOL) 325 MG tablet Take 650 mg by mouth every 6 (six) hours as needed for mild pain. Do not exceed 4 gms of tylenol in 24 hours    . atorvastatin (LIPITOR) 40 MG tablet Take 40 mg by mouth daily.    . B Complex-C-Folic Acid (DIALYVITE PO) Take 1 tablet by mouth every Monday, Wednesday, and Friday.    . calcium acetate (PHOSLO) 667 MG capsule Take 667-2,001 mg by mouth See admin instructions. Take 3 capsules (2001mg ) three times daily with a meal and 1 capsule (667mg ) with a snack.    . carvedilol (COREG) 12.5 MG tablet Take 1 tablet (12.5  mg total) by mouth See admin instructions. Take 1 tablet (12.5 mg) by mouth twice daily on Sunday, Tuesday, Thursday, Saturday (hold on dialysis days)    . cinacalcet (SENSIPAR) 30 MG tablet Take 30 mg by mouth daily with breakfast.     . clopidogrel (PLAVIX) 75 MG tablet     . colchicine 0.6 MG tablet Take by mouth.    . diclofenac sodium (VOLTAREN) 1 % GEL Apply 2 g topically 4 (four) times daily as needed (pain).   1  . EPINEPHrine (EPIPEN 2-PAK) 0.3 mg/0.3 mL IJ SOAJ injection Inject 0.3 mg into the muscle once as needed (anaphylaxis).    [name] HYDROcodone-acetaminophen (NORCO) 5-325 MG tablet Take 1 tablet by mouth every 6 (six) hours as needed for moderate pain. 30 tablet 0  . LANTUS SOLOSTAR 100 UNIT/ML Solostar Pen Inject 15 Units into the skin at bedtime. 15 mL 0  . omeprazole (PRILOSEC) 40 MG capsule Take 40 mg by mouth daily.    . ramipril (ALTACE) 10 MG capsule Take 10 mg by mouth 2 (two) times daily.    . sevelamer carbonate (RENVELA) 800 MG tablet Take by mouth.    . traMADol (ULTRAM) 50 MG tablet Take by mouth.     No current facility-administered medications for this visit.      REVIEW OF  SYSTEMS (Negative unless checked)  Constitutional: [] Weight loss  [] Fever  [] Chills Cardiac: [] Chest pain   [] Chest pressure   [x] Palpitations   [] Shortness of breath when laying flat   [] Shortness of breath at rest   [x] Shortness of breath with exertion. Vascular:  [] Pain in legs with walking   [] Pain in legs at rest   [] Pain in legs when laying flat   [] Claudication   [] Pain in feet when walking  [] Pain in feet at rest  [] Pain in feet when laying flat   [] History of DVT   [] Phlebitis   [] Swelling in legs   [] Varicose veins   [] Non-healing ulcers Pulmonary:   [] Uses home oxygen   [] Productive cough   [] Hemoptysis   [] Wheeze  [] COPD   [] Asthma Neurologic:  [] Dizziness  [] Blackouts   [] Seizures   [x] History of stroke   [] History of TIA  [] Aphasia   [] Temporary blindness   [] Dysphagia   [] Weakness or numbness in arms   [] Weakness or numbness in legs Musculoskeletal:  [x] Arthritis   [] Joint swelling   [x] Joint pain   [] Low back pain Hematologic:  [] Easy bruising  [] Easy bleeding   [] Hypercoagulable state   [x] Anemic  [] Hepatitis Gastrointestinal:  [] Blood in stool   [] Vomiting blood  [] Gastroesophageal reflux/heartburn   [] Abdominal pain Genitourinary:  [x] Chronic kidney disease   [] Difficult urination  [] Frequent urination  [] Burning with urination   [] Hematuria Skin:  [] Rashes   [] Ulcers   [] Wounds Psychological:  [x] History of anxiety   []  History of major depression.   Physical Exam BP 133/66 (BP Location: Right Arm)   Pulse 76   Resp 16   Wt 274 lb (124.3 kg)   BMI 35.18 kg/m  Gen:  WD/WN, NAD Skin: incision C/D/I Heart: RRR, No JVD Lungs: respirations not labored, no use of accessory muscles Ext: soft thrill in left arm AVF, mild swelling in left arm     Assessment/Plan: Insulin-requiring or dependent type II diabetes mellitus (HCC) blood glucose control important in reducing the progression of atherosclerotic disease. Also, involved in wound healing. On appropriate  medications.   Essential hypertension, benign blood pressure control important in reducing the progression of atherosclerotic  disease. On appropriate oral medications.   PAD (peripheral artery disease) (HCC) S/p BLE amputations  ESRD (end stage renal disease) ([place]) His fistula still several weeks from being used.  We can recheck this in about 2 to 3 months with a duplex in the office. He wants to have his PD catheter removed and I think that is reasonable if he is not using it anymore.  This can be done as an outpatient procedure in the near future at his convenience.      Leotis Pain 07/05/2018, 9:08 AM   This note was created with Dragon medical transcription system.  Any errors from dictation are unintentional.

## 2018-07-06 DIAGNOSIS — N186 End stage renal disease: Secondary | ICD-10-CM | POA: Diagnosis not present

## 2018-07-06 DIAGNOSIS — N2581 Secondary hyperparathyroidism of renal origin: Secondary | ICD-10-CM | POA: Diagnosis not present

## 2018-07-06 DIAGNOSIS — D509 Iron deficiency anemia, unspecified: Secondary | ICD-10-CM | POA: Diagnosis not present

## 2018-07-06 DIAGNOSIS — Z992 Dependence on renal dialysis: Secondary | ICD-10-CM | POA: Diagnosis not present

## 2018-07-07 ENCOUNTER — Encounter (INDEPENDENT_AMBULATORY_CARE_PROVIDER_SITE_OTHER): Payer: Self-pay

## 2018-07-08 DIAGNOSIS — Z992 Dependence on renal dialysis: Secondary | ICD-10-CM | POA: Diagnosis not present

## 2018-07-08 DIAGNOSIS — N2581 Secondary hyperparathyroidism of renal origin: Secondary | ICD-10-CM | POA: Diagnosis not present

## 2018-07-08 DIAGNOSIS — D509 Iron deficiency anemia, unspecified: Secondary | ICD-10-CM | POA: Diagnosis not present

## 2018-07-08 DIAGNOSIS — N186 End stage renal disease: Secondary | ICD-10-CM | POA: Diagnosis not present

## 2018-07-11 ENCOUNTER — Other Ambulatory Visit (INDEPENDENT_AMBULATORY_CARE_PROVIDER_SITE_OTHER): Payer: Self-pay | Admitting: Vascular Surgery

## 2018-07-11 ENCOUNTER — Inpatient Hospital Stay: Admission: RE | Admit: 2018-07-11 | Payer: Medicare Other | Source: Ambulatory Visit

## 2018-07-11 DIAGNOSIS — N2581 Secondary hyperparathyroidism of renal origin: Secondary | ICD-10-CM | POA: Diagnosis not present

## 2018-07-11 DIAGNOSIS — D509 Iron deficiency anemia, unspecified: Secondary | ICD-10-CM | POA: Diagnosis not present

## 2018-07-11 DIAGNOSIS — N186 End stage renal disease: Secondary | ICD-10-CM | POA: Diagnosis not present

## 2018-07-11 DIAGNOSIS — Z992 Dependence on renal dialysis: Secondary | ICD-10-CM | POA: Diagnosis not present

## 2018-07-12 ENCOUNTER — Other Ambulatory Visit: Payer: Self-pay

## 2018-07-12 ENCOUNTER — Encounter
Admission: RE | Admit: 2018-07-12 | Discharge: 2018-07-12 | Disposition: A | Discharge status: Home / Self Care | Payer: Medicare Other | Source: Ambulatory Visit | Attending: Vascular Surgery | Admitting: Vascular Surgery

## 2018-07-12 DIAGNOSIS — Z01812 Encounter for preprocedural laboratory examination: Secondary | ICD-10-CM

## 2018-07-12 DIAGNOSIS — E1122 Type 2 diabetes mellitus with diabetic chronic kidney disease: Secondary | ICD-10-CM | POA: Diagnosis not present

## 2018-07-12 DIAGNOSIS — Z79891 Long term (current) use of opiate analgesic: Secondary | ICD-10-CM | POA: Diagnosis not present

## 2018-07-12 DIAGNOSIS — N186 End stage renal disease: Secondary | ICD-10-CM | POA: Insufficient documentation

## 2018-07-12 DIAGNOSIS — Z992 Dependence on renal dialysis: Secondary | ICD-10-CM | POA: Diagnosis not present

## 2018-07-12 DIAGNOSIS — Z89512 Acquired absence of left leg below knee: Secondary | ICD-10-CM | POA: Diagnosis not present

## 2018-07-12 DIAGNOSIS — Z794 Long term (current) use of insulin: Secondary | ICD-10-CM | POA: Diagnosis not present

## 2018-07-12 DIAGNOSIS — I120 Hypertensive chronic kidney disease with stage 5 chronic kidney disease or end stage renal disease: Secondary | ICD-10-CM | POA: Diagnosis not present

## 2018-07-12 DIAGNOSIS — E1151 Type 2 diabetes mellitus with diabetic peripheral angiopathy without gangrene: Secondary | ICD-10-CM | POA: Diagnosis not present

## 2018-07-12 DIAGNOSIS — Z89511 Acquired absence of right leg below knee: Secondary | ICD-10-CM | POA: Diagnosis not present

## 2018-07-12 DIAGNOSIS — D631 Anemia in chronic kidney disease: Secondary | ICD-10-CM | POA: Diagnosis not present

## 2018-07-12 DIAGNOSIS — Z8673 Personal history of transient ischemic attack (TIA), and cerebral infarction without residual deficits: Secondary | ICD-10-CM | POA: Diagnosis not present

## 2018-07-12 DIAGNOSIS — Z79899 Other long term (current) drug therapy: Secondary | ICD-10-CM | POA: Diagnosis not present

## 2018-07-12 DIAGNOSIS — I428 Other cardiomyopathies: Secondary | ICD-10-CM | POA: Diagnosis not present

## 2018-07-12 DIAGNOSIS — Z7902 Long term (current) use of antithrombotics/antiplatelets: Secondary | ICD-10-CM | POA: Diagnosis not present

## 2018-07-12 LAB — CBC WITH DIFFERENTIAL/PLATELET
Abs Immature Granulocytes: 0.03 10*3/uL (ref 0.00–0.07)
BASOS ABS: 0.1 10*3/uL (ref 0.0–0.1)
Basophils Relative: 1 %
Eosinophils Absolute: 1 10*3/uL — ABNORMAL HIGH (ref 0.0–0.5)
Eosinophils Relative: 16 %
HEMATOCRIT: 49.1 % (ref 39.0–52.0)
Hemoglobin: 15.3 g/dL (ref 13.0–17.0)
Immature Granulocytes: 1 %
LYMPHS ABS: 0.9 10*3/uL (ref 0.7–4.0)
Lymphocytes Relative: 14 %
MCH: 29.7 pg (ref 26.0–34.0)
MCHC: 31.2 g/dL (ref 30.0–36.0)
MCV: 95.2 fL (ref 80.0–100.0)
Monocytes Absolute: 0.7 10*3/uL (ref 0.1–1.0)
Monocytes Relative: 12 %
NRBC: 0 % (ref 0.0–0.2)
Neutro Abs: 3.5 10*3/uL (ref 1.7–7.7)
Neutrophils Relative %: 56 %
Platelets: 245 10*3/uL (ref 150–400)
RBC: 5.16 MIL/uL (ref 4.22–5.81)
RDW: 14.8 % (ref 11.5–15.5)
WBC: 6.3 10*3/uL (ref 4.0–10.5)

## 2018-07-12 LAB — TYPE AND SCREEN
ABO/RH(D): O POS
Antibody Screen: NEGATIVE

## 2018-07-12 LAB — BASIC METABOLIC PANEL
Anion gap: 15 (ref 5–15)
BUN: 41 mg/dL — ABNORMAL HIGH (ref 8–23)
CO2: 21 mmol/L — ABNORMAL LOW (ref 22–32)
Calcium: 7.8 mg/dL — ABNORMAL LOW (ref 8.9–10.3)
Chloride: 98 mmol/L (ref 98–111)
Creatinine, Ser: 11.97 mg/dL — ABNORMAL HIGH (ref 0.61–1.24)
GFR calc Af Amer: 4 mL/min — ABNORMAL LOW (ref >60)
GFR calc non Af Amer: 4 mL/min — ABNORMAL LOW (ref >60)
Glucose, Bld: 294 mg/dL — ABNORMAL HIGH (ref 70–99)
Potassium: 4.1 mmol/L (ref 3.5–5.1)
SODIUM: 134 mmol/L — AB (ref 135–145)

## 2018-07-12 LAB — APTT: aPTT: 35 seconds (ref 24–36)

## 2018-07-12 LAB — PROTIME-INR
INR: 1.13
Prothrombin Time: 14.4 seconds (ref 11.4–15.2)

## 2018-07-12 NOTE — Patient Instructions (Signed)
Your procedure is scheduled OA:CZYSA. 12/12 Report to Day Surgery. To find out your arrival time please call [phone] between [place] on Wed. 07/13/18.  Remember: Instructions that are not followed completely may result in serious medical risk,  up to and including death, or upon the discretion of your surgeon and anesthesiologist your  surgery may need to be rescheduled.     _X__ 1. Do not eat food after midnight the night before your procedure.                 No gum chewing or hard candies. You may drink clear liquids up to 2 hours                 before you are scheduled to arrive for your surgery- DO not drink clear                 liquids within 2 hours of the start of your surgery.                 Clear Liquids include:  water,  __X__2.  On the morning of surgery brush your teeth with toothpaste and water, you                may rinse your mouth with mouthwash if you wish.  Do not swallow any toothpaste of mouthwash.     _X__ 3.  No Alcohol for 24 hours before or after surgery.   ___ 4.  Do Not Smoke or use e-cigarettes For 24 Hours Prior to Your Surgery.                 Do not use any chewable tobacco products for at least 6 hours prior to                 surgery.  ____  5.  Bring all medications with you on the day of surgery if instructed.   __x__  6.  Notify your doctor if there is any change in your medical condition      (cold, fever, infections).     Do not wear jewelry, make-up, hairpins, clips or nail polish. Do not wear lotions, powders, or perfumes. You may wear deodorant. Do not shave 48 hours prior to surgery. Men may shave face and neck. Do not bring valuables to the hospital.    [facility] is not responsible for any belongings or valuables.  Contacts, dentures or bridgework may not be worn into surgery. Leave your suitcase in the car. After surgery it may be brought to your room. For patients admitted to the hospital, discharge  time is determined by your treatment team.   Patients discharged the day of surgery will not be allowed to drive home.   Please read over the following fact sheets that you were given:    _x___ Take these medicines the morning of surgery with A SIP OF WATER:    1. acetaminophen (TYLENOL) 325 MG tablet if needed  2. carvedilol (COREG) 12.5 MG tablet  3. colchicine 0.6 MG tablet  4.omeprazole (PRILOSEC) 40 MG capsule take extra dose the night before and one the morning of surgery  5.  6.  ____ Fleet Enema (as directed)   __x__ Use Sage Wipes as directed  ____ Use inhalers on the day of surgery  ____ Stop metformin 2 days prior to surgery    __x__ Take 1/2 of usual insulin dose the night before surgery. No insulin the morning  of surgery. 7.5 units the night before  _x___ Stop Plavix today  ____ Stop Anti-inflammatories on    ____ Stop supplements until after surgery.    ____ Bring C-Pap to the hospital.

## 2018-07-13 DIAGNOSIS — N2581 Secondary hyperparathyroidism of renal origin: Secondary | ICD-10-CM | POA: Diagnosis not present

## 2018-07-13 DIAGNOSIS — D509 Iron deficiency anemia, unspecified: Secondary | ICD-10-CM | POA: Diagnosis not present

## 2018-07-13 DIAGNOSIS — Z992 Dependence on renal dialysis: Secondary | ICD-10-CM | POA: Diagnosis not present

## 2018-07-13 DIAGNOSIS — N186 End stage renal disease: Secondary | ICD-10-CM | POA: Diagnosis not present

## 2018-07-14 ENCOUNTER — Ambulatory Visit: Payer: Medicare Other | Admitting: Anesthesiology

## 2018-07-14 ENCOUNTER — Encounter
Admission: RE | Disposition: A | Discharge status: Home / Self Care | Payer: Self-pay | Source: Home / Self Care | Attending: Vascular Surgery

## 2018-07-14 ENCOUNTER — Ambulatory Visit
Admission: RE | Admit: 2018-07-14 | Discharge: 2018-07-14 | Disposition: A | Discharge status: Home / Self Care | Payer: Medicare Other | Attending: Vascular Surgery | Admitting: Vascular Surgery

## 2018-07-14 ENCOUNTER — Other Ambulatory Visit: Payer: Self-pay

## 2018-07-14 DIAGNOSIS — E039 Hypothyroidism, unspecified: Secondary | ICD-10-CM | POA: Diagnosis not present

## 2018-07-14 DIAGNOSIS — N186 End stage renal disease: Secondary | ICD-10-CM | POA: Insufficient documentation

## 2018-07-14 DIAGNOSIS — E1151 Type 2 diabetes mellitus with diabetic peripheral angiopathy without gangrene: Secondary | ICD-10-CM | POA: Insufficient documentation

## 2018-07-14 DIAGNOSIS — I120 Hypertensive chronic kidney disease with stage 5 chronic kidney disease or end stage renal disease: Secondary | ICD-10-CM | POA: Insufficient documentation

## 2018-07-14 DIAGNOSIS — Z8673 Personal history of transient ischemic attack (TIA), and cerebral infarction without residual deficits: Secondary | ICD-10-CM | POA: Diagnosis not present

## 2018-07-14 DIAGNOSIS — D631 Anemia in chronic kidney disease: Secondary | ICD-10-CM | POA: Diagnosis not present

## 2018-07-14 DIAGNOSIS — Z89512 Acquired absence of left leg below knee: Secondary | ICD-10-CM | POA: Insufficient documentation

## 2018-07-14 DIAGNOSIS — E1122 Type 2 diabetes mellitus with diabetic chronic kidney disease: Secondary | ICD-10-CM | POA: Insufficient documentation

## 2018-07-14 DIAGNOSIS — Z79899 Other long term (current) drug therapy: Secondary | ICD-10-CM | POA: Insufficient documentation

## 2018-07-14 DIAGNOSIS — Z992 Dependence on renal dialysis: Secondary | ICD-10-CM | POA: Insufficient documentation

## 2018-07-14 DIAGNOSIS — Z794 Long term (current) use of insulin: Secondary | ICD-10-CM | POA: Insufficient documentation

## 2018-07-14 DIAGNOSIS — Z7902 Long term (current) use of antithrombotics/antiplatelets: Secondary | ICD-10-CM | POA: Insufficient documentation

## 2018-07-14 DIAGNOSIS — I428 Other cardiomyopathies: Secondary | ICD-10-CM | POA: Insufficient documentation

## 2018-07-14 DIAGNOSIS — Z79891 Long term (current) use of opiate analgesic: Secondary | ICD-10-CM | POA: Insufficient documentation

## 2018-07-14 DIAGNOSIS — K219 Gastro-esophageal reflux disease without esophagitis: Secondary | ICD-10-CM | POA: Diagnosis not present

## 2018-07-14 DIAGNOSIS — Z4902 Encounter for fitting and adjustment of peritoneal dialysis catheter: Secondary | ICD-10-CM | POA: Diagnosis not present

## 2018-07-14 DIAGNOSIS — Z89511 Acquired absence of right leg below knee: Secondary | ICD-10-CM | POA: Insufficient documentation

## 2018-07-14 HISTORY — PX: REMOVAL OF A DIALYSIS CATHETER: SHX6053

## 2018-07-14 LAB — GLUCOSE, CAPILLARY
Glucose-Capillary: 81 mg/dL (ref 70–99)
Glucose-Capillary: 80 mg/dL (ref 70–99)

## 2018-07-14 LAB — POCT I-STAT 4, (NA,K, GLUC, HGB,HCT)
Glucose, Bld: 78 mg/dL (ref 70–99)
HCT: 50 % (ref 39.0–52.0)
Hemoglobin: 17 g/dL (ref 13.0–17.0)
Potassium: 3.5 mmol/L (ref 3.5–5.1)
SODIUM: 140 mmol/L (ref 135–145)

## 2018-07-14 SURGERY — REMOVAL, DIALYSIS CATHETER
Anesthesia: General

## 2018-07-14 MED ORDER — FENTANYL CITRATE (PF) 100 MCG/2ML IJ SOLN
25.0000 ug | INTRAMUSCULAR | Status: DC | PRN
  Administered 2018-07-14: 50 ug via INTRAVENOUS

## 2018-07-14 MED ORDER — VANCOMYCIN HCL IN DEXTROSE 1-5 GM/200ML-% IV SOLN
1000.0000 mg | INTRAVENOUS | Status: AC
  Administered 2018-07-14: 1000 mg via INTRAVENOUS

## 2018-07-14 MED ORDER — CHLORHEXIDINE GLUCONATE CLOTH 2 % EX PADS: 6.0000 | MEDICATED_PAD | Freq: Once | CUTANEOUS | Status: DC

## 2018-07-14 MED ORDER — OXYCODONE HCL 5 MG/5ML PO SOLN: 5.0000 mg | Freq: Once | ORAL | Status: DC | PRN

## 2018-07-14 MED ORDER — OXYCODONE HCL 5 MG PO TABS: 5.0000 mg | ORAL_TABLET | Freq: Once | ORAL | Status: DC | PRN

## 2018-07-14 MED ORDER — EPHEDRINE SULFATE 50 MG/ML IJ SOLN
INTRAMUSCULAR | Status: DC | PRN
  Administered 2018-07-14: 10 mg via INTRAVENOUS
  Administered 2018-07-14: 15 mg via INTRAVENOUS

## 2018-07-14 MED ORDER — LIDOCAINE HCL (CARDIAC) PF 100 MG/5ML IV SOSY
PREFILLED_SYRINGE | INTRAVENOUS | Status: DC | PRN
  Administered 2018-07-14 (×2): 50 mg via INTRAVENOUS

## 2018-07-14 MED ORDER — VANCOMYCIN HCL IN DEXTROSE 1-5 GM/200ML-% IV SOLN
INTRAVENOUS | Status: AC
  Filled 2018-07-14: qty 200

## 2018-07-14 MED ORDER — FENTANYL CITRATE (PF) 100 MCG/2ML IJ SOLN
INTRAMUSCULAR | Status: DC | PRN
  Administered 2018-07-14: 50 ug via INTRAVENOUS
  Administered 2018-07-14 (×2): 25 ug via INTRAVENOUS

## 2018-07-14 MED ORDER — ONDANSETRON HCL 4 MG/2ML IJ SOLN
INTRAMUSCULAR | Status: AC
  Filled 2018-07-14: qty 2

## 2018-07-14 MED ORDER — LIDOCAINE HCL (PF) 2 % IJ SOLN
INTRAMUSCULAR | Status: AC
  Filled 2018-07-14: qty 10

## 2018-07-14 MED ORDER — SODIUM CHLORIDE 0.9 % IV SOLN
INTRAVENOUS | Status: DC
  Administered 2018-07-14: 16:00:00 via INTRAVENOUS

## 2018-07-14 MED ORDER — PHENYLEPHRINE HCL 10 MG/ML IJ SOLN
INTRAMUSCULAR | Status: DC | PRN
  Administered 2018-07-14 (×2): 100 ug via INTRAVENOUS
  Administered 2018-07-14: 200 ug via INTRAVENOUS
  Administered 2018-07-14: 100 ug via INTRAVENOUS

## 2018-07-14 MED ORDER — BACITRACIN ZINC 500 UNIT/GM EX OINT
TOPICAL_OINTMENT | CUTANEOUS | Status: DC | PRN
  Administered 2018-07-14: 1 via TOPICAL

## 2018-07-14 MED ORDER — HYDROCODONE-ACETAMINOPHEN 5-325 MG PO TABS: 1.0000 | ORAL_TABLET | Freq: Four times a day (QID) | ORAL | 0 refills | Status: AC | PRN

## 2018-07-14 MED ORDER — PROPOFOL 10 MG/ML IV BOLUS
INTRAVENOUS | Status: AC
  Filled 2018-07-14: qty 20

## 2018-07-14 MED ORDER — FENTANYL CITRATE (PF) 100 MCG/2ML IJ SOLN
INTRAMUSCULAR | Status: AC
  Filled 2018-07-14: qty 2

## 2018-07-14 MED ORDER — PROPOFOL 10 MG/ML IV BOLUS
INTRAVENOUS | Status: DC | PRN
  Administered 2018-07-14: 100 mg via INTRAVENOUS
  Administered 2018-07-14: 150 mg via INTRAVENOUS

## 2018-07-14 MED ORDER — PROMETHAZINE HCL 25 MG/ML IJ SOLN: 6.2500 mg | INTRAMUSCULAR | Status: DC | PRN

## 2018-07-14 MED ORDER — MEPERIDINE HCL 50 MG/ML IJ SOLN: 6.2500 mg | INTRAMUSCULAR | Status: DC | PRN

## 2018-07-14 MED ORDER — BACITRACIN ZINC 500 UNIT/GM EX OINT
TOPICAL_OINTMENT | CUTANEOUS | Status: AC
  Filled 2018-07-14: qty 28.35

## 2018-07-14 MED ORDER — ONDANSETRON HCL 4 MG/2ML IJ SOLN
INTRAMUSCULAR | Status: DC | PRN
  Administered 2018-07-14: 4 mg via INTRAVENOUS

## 2018-07-14 SURGICAL SUPPLY — 24 items
ADH SKN CLS APL DERMABOND .7 (GAUZE/BANDAGES/DRESSINGS) ×1
CANISTER SUCT 1200ML W/VALVE (MISCELLANEOUS) ×3 IMPLANT
CHLORAPREP W/TINT 26ML (MISCELLANEOUS) ×3 IMPLANT
COVER WAND RF STERILE (DRAPES) ×1 IMPLANT
DERMABOND ADVANCED (GAUZE/BANDAGES/DRESSINGS) ×2
DERMABOND ADVANCED .7 DNX12 (GAUZE/BANDAGES/DRESSINGS) ×1 IMPLANT
DRAPE LAPAROTOMY TRNSV 106X77 (MISCELLANEOUS) ×3 IMPLANT
ELECT REM PT RETURN 9FT ADLT (ELECTROSURGICAL) ×3
ELECTRODE REM PT RTRN 9FT ADLT (ELECTROSURGICAL) ×1 IMPLANT
GAUZE SPONGE 4X4 12PLY STRL (GAUZE/BANDAGES/DRESSINGS) ×3 IMPLANT
GLOVE BIO SURGEON STRL SZ7 (GLOVE) ×3 IMPLANT
GOWN STRL REUS W/ TWL LRG LVL3 (GOWN DISPOSABLE) ×1 IMPLANT
GOWN STRL REUS W/ TWL XL LVL3 (GOWN DISPOSABLE) ×1 IMPLANT
GOWN STRL REUS W/TWL LRG LVL3 (GOWN DISPOSABLE) ×3
GOWN STRL REUS W/TWL XL LVL3 (GOWN DISPOSABLE) ×3
KIT TURNOVER KIT A (KITS) ×3 IMPLANT
LABEL OR SOLS (LABEL) ×3 IMPLANT
NDL HYPO 25X1 1.5 SAFETY (NEEDLE) ×1 IMPLANT
NEEDLE HYPO 25X1 1.5 SAFETY (NEEDLE) ×3 IMPLANT
NS IRRIG 500ML POUR BTL (IV SOLUTION) ×3 IMPLANT
PACK BASIN MINOR ARMC (MISCELLANEOUS) ×3 IMPLANT
SUT MNCRL AB 4-0 PS2 18 (SUTURE) ×3 IMPLANT
SUT VICRYL+ 3-0 36IN CT-1 (SUTURE) ×3 IMPLANT
SYR 10ML LL (SYRINGE) ×3 IMPLANT

## 2018-07-14 NOTE — Transfer of Care (Signed)
Immediate Anesthesia Transfer of Care Note  Patient: Jacob [name]  Procedure(s) Performed: REMOVAL OF A DIALYSIS CATHETER (N/A )  Patient Location: PACU  Anesthesia Type:General  Level of Consciousness: drowsy and patient cooperative  Airway & Oxygen Therapy: Patient Spontanous Breathing and Patient connected to face mask oxygen  Post-op Assessment: Report given to RN and Post -op Vital signs reviewed and stable  Post vital signs: Reviewed and stable  Last Vitals:  Vitals Value Taken Time  BP 124/60 07/14/2018  5:15 PM  Temp 36.3 C 07/14/2018  5:14 PM  Pulse 72 07/14/2018  5:17 PM  Resp 18 07/14/2018  5:17 PM  SpO2 100 % 07/14/2018  5:17 PM  Vitals shown include unvalidated device data.  Last Pain:  Vitals:   07/14/18 1537  TempSrc: Temporal  PainSc: 0-No pain         Complications: No apparent anesthesia complications

## 2018-07-14 NOTE — Anesthesia Postprocedure Evaluation (Signed)
Anesthesia Post Note  Patient: Jacob [name]  Procedure(s) Performed: REMOVAL OF A DIALYSIS CATHETER (N/A )  Patient location during evaluation: PACU Anesthesia Type: General Level of consciousness: awake and alert Pain management: pain level controlled Vital Signs Assessment: post-procedure vital signs reviewed and stable Respiratory status: spontaneous breathing, nonlabored ventilation, respiratory function stable and patient connected to nasal cannula oxygen Cardiovascular status: blood pressure returned to baseline and stable Postop Assessment: no apparent nausea or vomiting Anesthetic complications: no     Last Vitals:  Vitals:   07/14/18 1759 07/14/18 1814  BP: 131/65 (!) 131/54  Pulse: 78 72  Resp: 20 19  Temp:  (!) 36.2 C  SpO2: 95% 97%    Last Pain:  Vitals:   07/14/18 1827  TempSrc:   PainSc: 0-No pain                 Durenda Hurt

## 2018-07-14 NOTE — Anesthesia Post-op Follow-up Note (Signed)
Anesthesia QCDR form completed.        

## 2018-07-14 NOTE — Op Note (Signed)
[facility]   OPERATIVE NOTE  DATE: 07/14/2018  PRE-OPERATIVE DIAGNOSIS: ESRD, now on hemodialysis no longer using PD catheter  POST-OPERATIVE DIAGNOSIS: same as above  PROCEDURE: 1.   Removal of peritoneal dialysis catheter  SURGEON: Leotis Pain, MD  ASSISTANT(S): Hezzie Bump, PA-C  ANESTHESIA: general  ESTIMATED BLOOD LOSS: 5 cc  FINDING(S): 1.  none  SPECIMEN(S):  PD catheter  INDICATIONS:   Patient is a 67 y.o.male with renal failure. He has switched to HD and no longer needs his PD catheter.  The peritoneal catheter needs to be removed.  Risks and benefits are discussed. An assistant was present during the procedure to help facilitate the exposure and expedite the procedure.  DESCRIPTION: After obtaining full informed written consent, the patient was brought back to the operating room and placed supine upon the operating table.  The patient received IV antibiotics prior to induction.  After obtaining adequate anesthesia, the patient was prepped and draped in the standard fashion. The assistant provided retraction and mobilization to help facilitate exposure and expedite the procedure throughout the entire procedure.  This included following suture, using retractors, and optimizing lighting. A small incision below the deep cuff was made and the cuff was dissected free.  Gentle traction was used and the titanium connector was gently removed and the intraperitoneal portion was freed.  The catheter was then removed from the peritoneal cavity.  The superficial cuff was then dissected out and freed and the catheter was then transected and removed from the field.  The transverse upper abdominal incision was then closed with a 3-0 Vicryl and a 4-0 Monocryl and dermabond was placed.  A dry dressing was placed at the previous catheter exit site.  The patient was then awakened from anesthesia and taken to the recovery room in stable condition.  COMPLICATIONS:  None  CONDITION: Stable   Leotis Pain 07/14/2018 5:04 PM

## 2018-07-14 NOTE — Discharge Instructions (Signed)
General Anesthesia, Adult, Care After °These instructions provide you with information about caring for yourself after your procedure. Your health care provider may also give you more specific instructions. Your treatment has been planned according to current medical practices, but problems sometimes occur. Call your health care provider if you have any problems or questions after your procedure. °What can I expect after the procedure? °After the procedure, it is common to have: °· Vomiting. °· A sore throat. °· Mental slowness. ° °It is common to feel: °· Nauseous. °· Cold or shivery. °· Sleepy. °· Tired. °· Sore or achy, even in parts of your body where you did not have surgery. ° °Follow these instructions at home: °For at least 24 hours after the procedure: °· Do not: °? Participate in activities where you could fall or become injured. °? Drive. °? Use heavy machinery. °? Drink alcohol. °? Take sleeping pills or medicines that cause drowsiness. °? Make important decisions or sign legal documents. °? Take care of children on your own. °· Rest. °Eating and drinking °· If you vomit, drink water, juice, or soup when you can drink without vomiting. °· Drink enough fluid to keep your urine clear or pale yellow. °· Make sure you have little or no nausea before eating solid foods. °· Follow the diet recommended by your health care provider. °General instructions °· Have a responsible adult stay with you until you are awake and alert. °· Return to your normal activities as told by your health care provider. Ask your health care provider what activities are safe for you. °· Take over-the-counter and prescription medicines only as told by your health care provider. °· If you smoke, do not smoke without supervision. °· Keep all follow-up visits as told by your health care provider. This is important. °Contact a health care provider if: °· You continue to have nausea or vomiting at home, and medicines are not helpful. °· You  cannot drink fluids or start eating again. °· You cannot urinate after 8-12 hours. °· You develop a skin rash. °· You have fever. °· You have increasing redness at the site of your procedure. °Get help right away if: °· You have difficulty breathing. °· You have chest pain. °· You have unexpected bleeding. °· You feel that you are having a life-threatening or urgent problem. °This information is not intended to replace advice given to you by your health care provider. Make sure you discuss any questions you have with your health care provider. °Document Released: 10/26/2000 Document Revised: 12/23/2015 Document Reviewed: 07/04/2015 °Elsevier Interactive Patient Education © 2018 [employer] ° °

## 2018-07-14 NOTE — H&P (Signed)
[facility] History & Physical Update  The patient was interviewed and re-examined.  The patient's previous History and Physical has been reviewed and is unchanged.  There is no change in the plan of care. We plan to proceed with the scheduled procedure.  Leotis Pain, MD  07/14/2018, 3:16 PM

## 2018-07-14 NOTE — Anesthesia Preprocedure Evaluation (Signed)
Anesthesia Evaluation  Patient identified by MRN, date of birth, ID band Patient awake    Reviewed: Allergy & Precautions, NPO status , Patient's Chart, lab work & pertinent test results  History of Anesthesia Complications Negative for: history of anesthetic complications  Airway Mallampati: II  TM Distance: >3 FB Neck ROM: Full    Dental  (+) Poor Dentition, Loose, Missing   Pulmonary neg pulmonary ROS, neg sleep apnea, neg COPD,    breath sounds clear to auscultation- rhonchi (-) wheezing      Cardiovascular hypertension, Pt. on medications + Peripheral Vascular Disease  (-) CAD, (-) Past MI, (-) Cardiac Stents and (-) CABG  Rhythm:Regular Rate:Normal - Systolic murmurs and - Diastolic murmurs    Neuro/Psych PSYCHIATRIC DISORDERS Anxiety Depression CVA, No Residual Symptoms    GI/Hepatic Neg liver ROS, GERD  ,  Endo/Other  diabetes, Insulin DependentHypothyroidism   Renal/GU ESRF and DialysisRenal disease     Musculoskeletal  (+) Arthritis ,   Abdominal (+) + obese,   Peds  Hematology  (+) anemia ,   Anesthesia Other Findings Past Medical History: No date: Anemia in chronic kidney disease(285.21) No date: Anxiety No date: Arthritis No date: Depression No date: ESRD (end stage renal disease) on dialysis [facility])     Comment:  "MWF; [facility] [place]" (02/18/2017) No date: Essential hypertension No date: GERD (gastroesophageal reflux disease) No date: Glaucoma No date: History of blood transfusion 2016: History of stroke No date: Insomnia No date: Nonischemic cardiomyopathy (HCC) No date: Stroke [facility])     Comment:  mini stroke No date: Type 2 diabetes mellitus (HCC)   Reproductive/Obstetrics                             Anesthesia Physical Anesthesia Plan  ASA: IV  Anesthesia Plan: General   Post-op Pain Management:    Induction: Intravenous  PONV Risk Score and Plan:  1  Airway Management Planned: LMA  Additional Equipment:   Intra-op Plan:   Post-operative Plan:   Informed Consent: I have reviewed the patients History and Physical, chart, labs and discussed the procedure including the risks, benefits and alternatives for the proposed anesthesia with the patient or authorized representative who has indicated his/her understanding and acceptance.   Dental advisory given  Plan Discussed with: CRNA and Anesthesiologist  Anesthesia Plan Comments:         Anesthesia Quick Evaluation

## 2018-07-14 NOTE — Anesthesia Procedure Notes (Signed)
Procedure Name: LMA Insertion Date/Time: 07/14/2018 4:29 PM Performed by: Jonna Clark, CRNA Pre-anesthesia Checklist: Patient identified, Patient being monitored, Timeout performed, Emergency Drugs available and Suction available Patient Re-evaluated:Patient Re-evaluated prior to induction Oxygen Delivery Method: Circle system utilized Preoxygenation: Pre-oxygenation with 100% oxygen Induction Type: IV induction Ventilation: Mask ventilation without difficulty LMA: LMA inserted LMA Size: 5.0 Tube type: Oral Number of attempts: 1 Placement Confirmation: positive ETCO2 and breath sounds checked- equal and bilateral Tube secured with: Tape Dental Injury: Teeth and Oropharynx as per pre-operative assessment

## 2018-07-15 ENCOUNTER — Encounter: Payer: Self-pay | Admitting: Vascular Surgery

## 2018-07-15 DIAGNOSIS — Z992 Dependence on renal dialysis: Secondary | ICD-10-CM | POA: Diagnosis not present

## 2018-07-15 DIAGNOSIS — N2581 Secondary hyperparathyroidism of renal origin: Secondary | ICD-10-CM | POA: Diagnosis not present

## 2018-07-15 DIAGNOSIS — N186 End stage renal disease: Secondary | ICD-10-CM | POA: Diagnosis not present

## 2018-07-15 DIAGNOSIS — D509 Iron deficiency anemia, unspecified: Secondary | ICD-10-CM | POA: Diagnosis not present

## 2018-07-18 DIAGNOSIS — N186 End stage renal disease: Secondary | ICD-10-CM | POA: Diagnosis not present

## 2018-07-18 DIAGNOSIS — N2581 Secondary hyperparathyroidism of renal origin: Secondary | ICD-10-CM | POA: Diagnosis not present

## 2018-07-18 DIAGNOSIS — Z992 Dependence on renal dialysis: Secondary | ICD-10-CM | POA: Diagnosis not present

## 2018-07-18 DIAGNOSIS — D509 Iron deficiency anemia, unspecified: Secondary | ICD-10-CM | POA: Diagnosis not present

## 2018-07-20 DIAGNOSIS — D509 Iron deficiency anemia, unspecified: Secondary | ICD-10-CM | POA: Diagnosis not present

## 2018-07-20 DIAGNOSIS — N2581 Secondary hyperparathyroidism of renal origin: Secondary | ICD-10-CM | POA: Diagnosis not present

## 2018-07-20 DIAGNOSIS — Z992 Dependence on renal dialysis: Secondary | ICD-10-CM | POA: Diagnosis not present

## 2018-07-20 DIAGNOSIS — N186 End stage renal disease: Secondary | ICD-10-CM | POA: Diagnosis not present

## 2018-07-22 DIAGNOSIS — N2581 Secondary hyperparathyroidism of renal origin: Secondary | ICD-10-CM | POA: Diagnosis not present

## 2018-07-22 DIAGNOSIS — N186 End stage renal disease: Secondary | ICD-10-CM | POA: Diagnosis not present

## 2018-07-22 DIAGNOSIS — D509 Iron deficiency anemia, unspecified: Secondary | ICD-10-CM | POA: Diagnosis not present

## 2018-07-22 DIAGNOSIS — Z992 Dependence on renal dialysis: Secondary | ICD-10-CM | POA: Diagnosis not present

## 2018-07-24 DIAGNOSIS — N186 End stage renal disease: Secondary | ICD-10-CM | POA: Diagnosis not present

## 2018-07-24 DIAGNOSIS — D509 Iron deficiency anemia, unspecified: Secondary | ICD-10-CM | POA: Diagnosis not present

## 2018-07-24 DIAGNOSIS — N2581 Secondary hyperparathyroidism of renal origin: Secondary | ICD-10-CM | POA: Diagnosis not present

## 2018-07-24 DIAGNOSIS — Z992 Dependence on renal dialysis: Secondary | ICD-10-CM | POA: Diagnosis not present

## 2018-07-26 DIAGNOSIS — D509 Iron deficiency anemia, unspecified: Secondary | ICD-10-CM | POA: Diagnosis not present

## 2018-07-26 DIAGNOSIS — N186 End stage renal disease: Secondary | ICD-10-CM | POA: Diagnosis not present

## 2018-07-26 DIAGNOSIS — N2581 Secondary hyperparathyroidism of renal origin: Secondary | ICD-10-CM | POA: Diagnosis not present

## 2018-07-26 DIAGNOSIS — Z992 Dependence on renal dialysis: Secondary | ICD-10-CM | POA: Diagnosis not present

## 2018-07-28 DIAGNOSIS — Z0001 Encounter for general adult medical examination with abnormal findings: Secondary | ICD-10-CM | POA: Diagnosis not present

## 2018-07-28 DIAGNOSIS — E6609 Other obesity due to excess calories: Secondary | ICD-10-CM | POA: Diagnosis not present

## 2018-07-28 DIAGNOSIS — N186 End stage renal disease: Secondary | ICD-10-CM | POA: Diagnosis not present

## 2018-07-28 DIAGNOSIS — Z6835 Body mass index (BMI) 35.0-35.9, adult: Secondary | ICD-10-CM | POA: Diagnosis not present

## 2018-07-28 DIAGNOSIS — R946 Abnormal results of thyroid function studies: Secondary | ICD-10-CM | POA: Diagnosis not present

## 2018-07-28 DIAGNOSIS — E1021 Type 1 diabetes mellitus with diabetic nephropathy: Secondary | ICD-10-CM | POA: Diagnosis not present

## 2018-07-28 DIAGNOSIS — E8881 Metabolic syndrome: Secondary | ICD-10-CM | POA: Diagnosis not present

## 2018-07-28 DIAGNOSIS — Z89511 Acquired absence of right leg below knee: Secondary | ICD-10-CM | POA: Diagnosis not present

## 2018-07-28 DIAGNOSIS — Z1389 Encounter for screening for other disorder: Secondary | ICD-10-CM | POA: Diagnosis not present

## 2018-07-29 DIAGNOSIS — Z794 Long term (current) use of insulin: Secondary | ICD-10-CM | POA: Diagnosis not present

## 2018-07-29 DIAGNOSIS — Z992 Dependence on renal dialysis: Secondary | ICD-10-CM | POA: Diagnosis not present

## 2018-07-29 DIAGNOSIS — N186 End stage renal disease: Secondary | ICD-10-CM | POA: Diagnosis not present

## 2018-07-29 DIAGNOSIS — E119 Type 2 diabetes mellitus without complications: Secondary | ICD-10-CM | POA: Diagnosis not present

## 2018-07-29 DIAGNOSIS — N2581 Secondary hyperparathyroidism of renal origin: Secondary | ICD-10-CM | POA: Diagnosis not present

## 2018-07-29 DIAGNOSIS — D509 Iron deficiency anemia, unspecified: Secondary | ICD-10-CM | POA: Diagnosis not present

## 2018-08-01 DIAGNOSIS — D509 Iron deficiency anemia, unspecified: Secondary | ICD-10-CM | POA: Diagnosis not present

## 2018-08-01 DIAGNOSIS — Z992 Dependence on renal dialysis: Secondary | ICD-10-CM | POA: Diagnosis not present

## 2018-08-01 DIAGNOSIS — N2581 Secondary hyperparathyroidism of renal origin: Secondary | ICD-10-CM | POA: Diagnosis not present

## 2018-08-01 DIAGNOSIS — N186 End stage renal disease: Secondary | ICD-10-CM | POA: Diagnosis not present

## 2018-08-02 ENCOUNTER — Ambulatory Visit (INDEPENDENT_AMBULATORY_CARE_PROVIDER_SITE_OTHER): Payer: Medicare Other | Admitting: Vascular Surgery

## 2018-08-02 ENCOUNTER — Encounter (INDEPENDENT_AMBULATORY_CARE_PROVIDER_SITE_OTHER): Payer: Medicare Other

## 2018-08-02 DIAGNOSIS — N186 End stage renal disease: Secondary | ICD-10-CM | POA: Diagnosis not present

## 2018-08-02 DIAGNOSIS — Z992 Dependence on renal dialysis: Secondary | ICD-10-CM | POA: Diagnosis not present

## 2018-08-03 DIAGNOSIS — N186 End stage renal disease: Secondary | ICD-10-CM | POA: Diagnosis not present

## 2018-08-03 DIAGNOSIS — N2581 Secondary hyperparathyroidism of renal origin: Secondary | ICD-10-CM | POA: Diagnosis not present

## 2018-08-03 DIAGNOSIS — Z992 Dependence on renal dialysis: Secondary | ICD-10-CM | POA: Diagnosis not present

## 2018-08-03 DIAGNOSIS — D509 Iron deficiency anemia, unspecified: Secondary | ICD-10-CM | POA: Diagnosis not present

## 2018-08-05 DIAGNOSIS — N186 End stage renal disease: Secondary | ICD-10-CM | POA: Diagnosis not present

## 2018-08-05 DIAGNOSIS — Z992 Dependence on renal dialysis: Secondary | ICD-10-CM | POA: Diagnosis not present

## 2018-08-05 DIAGNOSIS — N2581 Secondary hyperparathyroidism of renal origin: Secondary | ICD-10-CM | POA: Diagnosis not present

## 2018-08-05 DIAGNOSIS — D509 Iron deficiency anemia, unspecified: Secondary | ICD-10-CM | POA: Diagnosis not present

## 2018-08-08 ENCOUNTER — Other Ambulatory Visit (INDEPENDENT_AMBULATORY_CARE_PROVIDER_SITE_OTHER): Payer: Self-pay | Admitting: Nurse Practitioner

## 2018-08-08 DIAGNOSIS — D509 Iron deficiency anemia, unspecified: Secondary | ICD-10-CM | POA: Diagnosis not present

## 2018-08-08 DIAGNOSIS — N2581 Secondary hyperparathyroidism of renal origin: Secondary | ICD-10-CM | POA: Diagnosis not present

## 2018-08-08 DIAGNOSIS — Z992 Dependence on renal dialysis: Secondary | ICD-10-CM | POA: Diagnosis not present

## 2018-08-08 DIAGNOSIS — N186 End stage renal disease: Secondary | ICD-10-CM | POA: Diagnosis not present

## 2018-08-10 DIAGNOSIS — Z992 Dependence on renal dialysis: Secondary | ICD-10-CM | POA: Diagnosis not present

## 2018-08-10 DIAGNOSIS — D509 Iron deficiency anemia, unspecified: Secondary | ICD-10-CM | POA: Diagnosis not present

## 2018-08-10 DIAGNOSIS — N186 End stage renal disease: Secondary | ICD-10-CM | POA: Diagnosis not present

## 2018-08-10 DIAGNOSIS — N2581 Secondary hyperparathyroidism of renal origin: Secondary | ICD-10-CM | POA: Diagnosis not present

## 2018-08-12 DIAGNOSIS — Z992 Dependence on renal dialysis: Secondary | ICD-10-CM | POA: Diagnosis not present

## 2018-08-12 DIAGNOSIS — N2581 Secondary hyperparathyroidism of renal origin: Secondary | ICD-10-CM | POA: Diagnosis not present

## 2018-08-12 DIAGNOSIS — D509 Iron deficiency anemia, unspecified: Secondary | ICD-10-CM | POA: Diagnosis not present

## 2018-08-12 DIAGNOSIS — N186 End stage renal disease: Secondary | ICD-10-CM | POA: Diagnosis not present

## 2018-08-15 DIAGNOSIS — D509 Iron deficiency anemia, unspecified: Secondary | ICD-10-CM | POA: Diagnosis not present

## 2018-08-15 DIAGNOSIS — N186 End stage renal disease: Secondary | ICD-10-CM | POA: Diagnosis not present

## 2018-08-15 DIAGNOSIS — Z992 Dependence on renal dialysis: Secondary | ICD-10-CM | POA: Diagnosis not present

## 2018-08-15 DIAGNOSIS — N2581 Secondary hyperparathyroidism of renal origin: Secondary | ICD-10-CM | POA: Diagnosis not present

## 2018-08-17 DIAGNOSIS — N2581 Secondary hyperparathyroidism of renal origin: Secondary | ICD-10-CM | POA: Diagnosis not present

## 2018-08-17 DIAGNOSIS — Z992 Dependence on renal dialysis: Secondary | ICD-10-CM | POA: Diagnosis not present

## 2018-08-17 DIAGNOSIS — N186 End stage renal disease: Secondary | ICD-10-CM | POA: Diagnosis not present

## 2018-08-17 DIAGNOSIS — D509 Iron deficiency anemia, unspecified: Secondary | ICD-10-CM | POA: Diagnosis not present

## 2018-08-19 DIAGNOSIS — D509 Iron deficiency anemia, unspecified: Secondary | ICD-10-CM | POA: Diagnosis not present

## 2018-08-19 DIAGNOSIS — Z992 Dependence on renal dialysis: Secondary | ICD-10-CM | POA: Diagnosis not present

## 2018-08-19 DIAGNOSIS — N2581 Secondary hyperparathyroidism of renal origin: Secondary | ICD-10-CM | POA: Diagnosis not present

## 2018-08-19 DIAGNOSIS — N186 End stage renal disease: Secondary | ICD-10-CM | POA: Diagnosis not present

## 2018-08-22 DIAGNOSIS — Z992 Dependence on renal dialysis: Secondary | ICD-10-CM | POA: Diagnosis not present

## 2018-08-22 DIAGNOSIS — N186 End stage renal disease: Secondary | ICD-10-CM | POA: Diagnosis not present

## 2018-08-22 DIAGNOSIS — D509 Iron deficiency anemia, unspecified: Secondary | ICD-10-CM | POA: Diagnosis not present

## 2018-08-22 DIAGNOSIS — N2581 Secondary hyperparathyroidism of renal origin: Secondary | ICD-10-CM | POA: Diagnosis not present

## 2018-08-24 DIAGNOSIS — D509 Iron deficiency anemia, unspecified: Secondary | ICD-10-CM | POA: Diagnosis not present

## 2018-08-24 DIAGNOSIS — N2581 Secondary hyperparathyroidism of renal origin: Secondary | ICD-10-CM | POA: Diagnosis not present

## 2018-08-24 DIAGNOSIS — N186 End stage renal disease: Secondary | ICD-10-CM | POA: Diagnosis not present

## 2018-08-24 DIAGNOSIS — Z992 Dependence on renal dialysis: Secondary | ICD-10-CM | POA: Diagnosis not present

## 2018-08-26 DIAGNOSIS — Z992 Dependence on renal dialysis: Secondary | ICD-10-CM | POA: Diagnosis not present

## 2018-08-26 DIAGNOSIS — N186 End stage renal disease: Secondary | ICD-10-CM | POA: Diagnosis not present

## 2018-08-26 DIAGNOSIS — N2581 Secondary hyperparathyroidism of renal origin: Secondary | ICD-10-CM | POA: Diagnosis not present

## 2018-08-26 DIAGNOSIS — D509 Iron deficiency anemia, unspecified: Secondary | ICD-10-CM | POA: Diagnosis not present

## 2018-08-29 DIAGNOSIS — N186 End stage renal disease: Secondary | ICD-10-CM | POA: Diagnosis not present

## 2018-08-29 DIAGNOSIS — D509 Iron deficiency anemia, unspecified: Secondary | ICD-10-CM | POA: Diagnosis not present

## 2018-08-29 DIAGNOSIS — Z992 Dependence on renal dialysis: Secondary | ICD-10-CM | POA: Diagnosis not present

## 2018-08-29 DIAGNOSIS — N2581 Secondary hyperparathyroidism of renal origin: Secondary | ICD-10-CM | POA: Diagnosis not present

## 2018-08-31 DIAGNOSIS — N186 End stage renal disease: Secondary | ICD-10-CM | POA: Diagnosis not present

## 2018-08-31 DIAGNOSIS — N2581 Secondary hyperparathyroidism of renal origin: Secondary | ICD-10-CM | POA: Diagnosis not present

## 2018-08-31 DIAGNOSIS — D509 Iron deficiency anemia, unspecified: Secondary | ICD-10-CM | POA: Diagnosis not present

## 2018-08-31 DIAGNOSIS — Z992 Dependence on renal dialysis: Secondary | ICD-10-CM | POA: Diagnosis not present

## 2018-09-02 DIAGNOSIS — Z794 Long term (current) use of insulin: Secondary | ICD-10-CM | POA: Diagnosis not present

## 2018-09-02 DIAGNOSIS — N2581 Secondary hyperparathyroidism of renal origin: Secondary | ICD-10-CM | POA: Diagnosis not present

## 2018-09-02 DIAGNOSIS — H26493 Other secondary cataract, bilateral: Secondary | ICD-10-CM | POA: Diagnosis not present

## 2018-09-02 DIAGNOSIS — E103393 Type 1 diabetes mellitus with moderate nonproliferative diabetic retinopathy without macular edema, bilateral: Secondary | ICD-10-CM | POA: Diagnosis not present

## 2018-09-02 DIAGNOSIS — Z961 Presence of intraocular lens: Secondary | ICD-10-CM | POA: Diagnosis not present

## 2018-09-02 DIAGNOSIS — D509 Iron deficiency anemia, unspecified: Secondary | ICD-10-CM | POA: Diagnosis not present

## 2018-09-02 DIAGNOSIS — Z992 Dependence on renal dialysis: Secondary | ICD-10-CM | POA: Diagnosis not present

## 2018-09-02 DIAGNOSIS — N186 End stage renal disease: Secondary | ICD-10-CM | POA: Diagnosis not present

## 2018-09-05 DIAGNOSIS — Z992 Dependence on renal dialysis: Secondary | ICD-10-CM | POA: Diagnosis not present

## 2018-09-05 DIAGNOSIS — N2581 Secondary hyperparathyroidism of renal origin: Secondary | ICD-10-CM | POA: Diagnosis not present

## 2018-09-05 DIAGNOSIS — D509 Iron deficiency anemia, unspecified: Secondary | ICD-10-CM | POA: Diagnosis not present

## 2018-09-05 DIAGNOSIS — Z23 Encounter for immunization: Secondary | ICD-10-CM | POA: Diagnosis not present

## 2018-09-05 DIAGNOSIS — N186 End stage renal disease: Secondary | ICD-10-CM | POA: Diagnosis not present

## 2018-09-07 DIAGNOSIS — D509 Iron deficiency anemia, unspecified: Secondary | ICD-10-CM | POA: Diagnosis not present

## 2018-09-07 DIAGNOSIS — N2581 Secondary hyperparathyroidism of renal origin: Secondary | ICD-10-CM | POA: Diagnosis not present

## 2018-09-07 DIAGNOSIS — N186 End stage renal disease: Secondary | ICD-10-CM | POA: Diagnosis not present

## 2018-09-07 DIAGNOSIS — Z992 Dependence on renal dialysis: Secondary | ICD-10-CM | POA: Diagnosis not present

## 2018-09-07 DIAGNOSIS — Z23 Encounter for immunization: Secondary | ICD-10-CM | POA: Diagnosis not present

## 2018-09-08 ENCOUNTER — Encounter (INDEPENDENT_AMBULATORY_CARE_PROVIDER_SITE_OTHER): Payer: Medicare Other

## 2018-09-08 ENCOUNTER — Ambulatory Visit (INDEPENDENT_AMBULATORY_CARE_PROVIDER_SITE_OTHER): Payer: Medicare Other | Admitting: Nurse Practitioner

## 2018-09-09 DIAGNOSIS — Z992 Dependence on renal dialysis: Secondary | ICD-10-CM | POA: Diagnosis not present

## 2018-09-09 DIAGNOSIS — N186 End stage renal disease: Secondary | ICD-10-CM | POA: Diagnosis not present

## 2018-09-09 DIAGNOSIS — N2581 Secondary hyperparathyroidism of renal origin: Secondary | ICD-10-CM | POA: Diagnosis not present

## 2018-09-09 DIAGNOSIS — Z23 Encounter for immunization: Secondary | ICD-10-CM | POA: Diagnosis not present

## 2018-09-09 DIAGNOSIS — D509 Iron deficiency anemia, unspecified: Secondary | ICD-10-CM | POA: Diagnosis not present

## 2018-09-12 DIAGNOSIS — Z992 Dependence on renal dialysis: Secondary | ICD-10-CM | POA: Diagnosis not present

## 2018-09-12 DIAGNOSIS — N186 End stage renal disease: Secondary | ICD-10-CM | POA: Diagnosis not present

## 2018-09-12 DIAGNOSIS — N2581 Secondary hyperparathyroidism of renal origin: Secondary | ICD-10-CM | POA: Diagnosis not present

## 2018-09-12 DIAGNOSIS — Z23 Encounter for immunization: Secondary | ICD-10-CM | POA: Diagnosis not present

## 2018-09-12 DIAGNOSIS — D509 Iron deficiency anemia, unspecified: Secondary | ICD-10-CM | POA: Diagnosis not present

## 2018-09-14 DIAGNOSIS — N186 End stage renal disease: Secondary | ICD-10-CM | POA: Diagnosis not present

## 2018-09-14 DIAGNOSIS — Z23 Encounter for immunization: Secondary | ICD-10-CM | POA: Diagnosis not present

## 2018-09-14 DIAGNOSIS — N2581 Secondary hyperparathyroidism of renal origin: Secondary | ICD-10-CM | POA: Diagnosis not present

## 2018-09-14 DIAGNOSIS — D509 Iron deficiency anemia, unspecified: Secondary | ICD-10-CM | POA: Diagnosis not present

## 2018-09-14 DIAGNOSIS — Z992 Dependence on renal dialysis: Secondary | ICD-10-CM | POA: Diagnosis not present

## 2018-09-15 ENCOUNTER — Encounter (INDEPENDENT_AMBULATORY_CARE_PROVIDER_SITE_OTHER): Payer: Self-pay | Admitting: Nurse Practitioner

## 2018-09-15 ENCOUNTER — Ambulatory Visit (INDEPENDENT_AMBULATORY_CARE_PROVIDER_SITE_OTHER): Payer: Medicare Other

## 2018-09-15 ENCOUNTER — Ambulatory Visit (INDEPENDENT_AMBULATORY_CARE_PROVIDER_SITE_OTHER): Payer: Medicare Other | Admitting: Nurse Practitioner

## 2018-09-15 VITALS — BP 110/70 | HR 83 | Resp 18 | Ht 74.0 in | Wt 274.0 lb

## 2018-09-15 DIAGNOSIS — Z992 Dependence on renal dialysis: Secondary | ICD-10-CM | POA: Diagnosis not present

## 2018-09-15 DIAGNOSIS — E782 Mixed hyperlipidemia: Secondary | ICD-10-CM

## 2018-09-15 DIAGNOSIS — I10 Essential (primary) hypertension: Secondary | ICD-10-CM

## 2018-09-15 DIAGNOSIS — N186 End stage renal disease: Secondary | ICD-10-CM | POA: Diagnosis not present

## 2018-09-15 DIAGNOSIS — I120 Hypertensive chronic kidney disease with stage 5 chronic kidney disease or end stage renal disease: Secondary | ICD-10-CM | POA: Diagnosis not present

## 2018-09-16 DIAGNOSIS — D509 Iron deficiency anemia, unspecified: Secondary | ICD-10-CM | POA: Diagnosis not present

## 2018-09-16 DIAGNOSIS — N186 End stage renal disease: Secondary | ICD-10-CM | POA: Diagnosis not present

## 2018-09-16 DIAGNOSIS — N2581 Secondary hyperparathyroidism of renal origin: Secondary | ICD-10-CM | POA: Diagnosis not present

## 2018-09-16 DIAGNOSIS — Z992 Dependence on renal dialysis: Secondary | ICD-10-CM | POA: Diagnosis not present

## 2018-09-16 DIAGNOSIS — Z23 Encounter for immunization: Secondary | ICD-10-CM | POA: Diagnosis not present

## 2018-09-16 NOTE — Progress Notes (Deleted)
Cardiology Office Note  Date: 09/16/2018   ID: Jacob [name], DOB [birth year], MRN [id number]  PCP: Sharilyn Sites, MD  Primary Cardiologist: Rozann Lesches, MD   No chief complaint on file.   History of Present Illness: Jacob [name] is a 68 y.o. male last seen in February 2019.  Echocardiogram in July 2019 revealed LVEF 55 to 60% range.  Past Medical History:  Diagnosis Date  . Anemia in chronic kidney disease(285.21)   . Anxiety   . Arthritis   . Depression   . ESRD (end stage renal disease) on dialysis [facility])    "MWF; [facility] [place]" (02/18/2017)  . Essential hypertension   . GERD (gastroesophageal reflux disease)   . Glaucoma   . History of blood transfusion   . History of stroke 2016  . Insomnia   . Nonischemic cardiomyopathy ([place])   . Stroke [facility])    mini stroke  . Type 2 diabetes mellitus ([place])     Past Surgical History:  Procedure Laterality Date  . A/V FISTULAGRAM Right 10/06/2016   Procedure: A/V Fistulagram;  Surgeon: [name], MD;  Location: [place] [facility];  Service: Cardiovascular;  Laterality: Right;  . A/V FISTULAGRAM Right 04/27/2017   Procedure: A/V Fistulagram;  Surgeon: [name], MD;  Location: [place] [facility];  Service: Cardiovascular;  Laterality: Right;  . A/V SHUNTOGRAM Right 11/17/2017   Procedure: A/V SHUNTOGRAM;  Surgeon: Waynetta Sandy, MD;  Location: [place] CV [facility];  Service: Cardiovascular;  Laterality: Right;  . ABDOMINAL AORTOGRAM N/A 12/30/2016   Procedure: Abdominal Aortogram;  Surgeon: Waynetta Sandy, MD;  Location: [place] CV [facility];  Service: Cardiovascular;  Laterality: N/A;  . ABDOMINAL AORTOGRAM N/A 04/08/2017   Procedure: ABDOMINAL AORTOGRAM;  Surgeon: Conrad Meyer, MD;  Location: [place] [facility];  Service: Cardiovascular;  Laterality: N/A;  . ABDOMINAL AORTOGRAM W/LOWER EXTREMITY N/A 02/23/2017   Procedure: Abdominal Aortogram w/Lower Extremity;  Surgeon: [name], MD;   Location: [place] [facility];  Service: Cardiovascular;  Laterality: N/A;  Rt. leg  . AMPUTATION Right 02/28/2017   Procedure: RIGHT BELOW KNEE AMPUTATION;  Surgeon: [name], MD;  Location: [place];  Service: Vascular;  Laterality: Right;  . AV FISTULA PLACEMENT     Hx: of  . AV FISTULA PLACEMENT Right 05/04/2013   Procedure: ARTERIOVENOUS (AV) FISTULA CREATION- RIGHT ARM;  Surgeon: Conrad Advance, MD;  Location: [place];  Service: Vascular;  Laterality: Right;  Ultrasound guided  . AV FISTULA PLACEMENT Right 07/28/2016   Procedure: BRACHIOCEPHALIC ARTERIOVENOUS (AV) FISTULA CREATION;  Surgeon: [name], MD;  Location: [place];  Service: Vascular;  Laterality: Right;  . AV FISTULA PLACEMENT Left 06/16/2018   Procedure: ARTERIOVENOUS (AV) FISTULA CREATION ( BRACHIALCEPHALIC );  Surgeon: Algernon Huxley, MD;  Location: [facility];  Service: Vascular;  Laterality: Left;  . BACK SURGERY    . BELOW KNEE LEG AMPUTATION     1 PRIOR AMPUTATION ON FOOT  RT LEG/ FOOT  . CAPD INSERTION N/A 03/11/2018   Procedure: LAPAROSCOPIC INSERTION OF PERIOTONEAL DIALYSIS CATHETER;  Surgeon: Clovis Riley, MD;  Location: [place];  Service: General;  Laterality: N/A;  . CATARACT EXTRACTION W/PHACO Left 07/16/2014   Procedure: CATARACT EXTRACTION PHACO AND INTRAOCULAR LENS PLACEMENT ([place]);  Surgeon: [name], MD;  Location: [facility];  Service: Ophthalmology;  Laterality: Left;  CDE:8.86  . CATARACT EXTRACTION W/PHACO Right 07/30/2014   Procedure: CATARACT EXTRACTION PHACO  AND INTRAOCULAR LENS PLACEMENT (IOC);  Surgeon: [name], MD;  Location: [facility];  Service: Ophthalmology;  Laterality: Right;  CDE 8.99  . COLONOSCOPY     Hx: of  . EYE SURGERY     bilateral cataract  . FISTULA SUPERFICIALIZATION Right 07/28/2016   Procedure: FISTULA SUPERFICIALIZATION;  Surgeon: [name], MD;  Location: [place];  Service: Vascular;  Laterality: Right;  . FISTULA SUPERFICIALIZATION Right 10/13/2016   Procedure:  BRACHIOCEPHALIC ARTERIOVENOUS FISTULA SUPERFICIALIZATION;  Surgeon: [name], MD;  Location: [place];  Service: Vascular;  Laterality: Right;  . FISTULOGRAM N/A 05/04/2013   Procedure: CENTRAL VENOGRAM;  Surgeon: Conrad Rocky, MD;  Location: [place];  Service: Vascular;  Laterality: N/A;  . INSERTION OF DIALYSIS CATHETER Right 05/04/2013   Procedure: INSERTION OF DIALYSIS CATHETER;  Surgeon: Conrad [place], MD;  Location: [place];  Service: Vascular;  Laterality: Right;  Ultrasound guided  . INSERTION OF DIALYSIS CATHETER N/A 07/28/2016   Procedure: INSERTION OF DIALYSIS CATHETER;  Surgeon: [name], MD;  Location: [place];  Service: Vascular;  Laterality: N/A;  . INSERTION OF DIALYSIS CATHETER Right 03/07/2018   Procedure: INSERTION OF DIALYSIS CATHETER;  Surgeon: Conrad [place], MD;  Location: [place];  Service: Vascular;  Laterality: Right;  . IR FLUORO GUIDE CV LINE RIGHT  04/13/2017  . IR FLUORO GUIDE CV LINE RIGHT  05/19/2018  . IR THROMBECTOMY AV FISTULA W/THROMBOLYSIS/PTA/STENT INC/SHUNT/IMG RT Right 09/07/2017  . IR US GUIDE VASC ACCESS RIGHT  04/13/2017  . IR US GUIDE VASC ACCESS RIGHT  09/07/2017  . LIGATION OF ARTERIOVENOUS  FISTULA Left 05/04/2013   Procedure: LIGATION OF LEFT RADIAL CEPHALIC ARTERIOVENOUS  FISTULA;  Surgeon: Conrad [place], MD;  Location: [place];  Service: Vascular;  Laterality: Left;  Ultrasound guided  . LIGATION OF ARTERIOVENOUS  FISTULA Right 07/28/2016   Procedure: LIGATION OF RADIOCEPHALIC ARTERIOVENOUS  FISTULA;  Surgeon: [name], MD;  Location: [place];  Service: Vascular;  Laterality: Right;  . LOWER EXTREMITY ANGIOGRAPHY N/A 12/30/2016   Procedure: Lower Extremity Angiography;  Surgeon: Waynetta Sandy, MD;  Location: [place] [facility];  Service: Cardiovascular;  Laterality: N/A;  . LUMBAR SPINE SURGERY    . PERIPHERAL VASCULAR ATHERECTOMY Right 12/30/2016   Procedure: Peripheral Vascular Atherectomy;  Surgeon: Waynetta Sandy, MD;  Location: [place] [facility];  Service: Cardiovascular;  Laterality: Right;  AT and PT  . PERIPHERAL VASCULAR BALLOON ANGIOPLASTY Right 12/30/2016   Procedure: Peripheral Vascular Balloon Angioplasty;  Surgeon: Waynetta Sandy, MD;  Location: [place] [facility];  Service: Cardiovascular;  Laterality: Right;  PTA of PT and AT  . PERIPHERAL VASCULAR BALLOON ANGIOPLASTY  02/23/2017   Procedure: Peripheral Vascular Balloon Angioplasty;  Surgeon: [name], MD;  Location: [place] [facility];  Service: Cardiovascular;;  RT. Anterior Tib.  [name] PERIPHERAL VASCULAR BALLOON ANGIOPLASTY  04/08/2017   Procedure: PERIPHERAL VASCULAR BALLOON ANGIOPLASTY;  Surgeon: Conrad [place], MD;  Location: [place] [facility];  Service: Cardiovascular;;  . PERIPHERAL VASCULAR CATHETERIZATION N/A 01/24/2015   Procedure: Fistulagram;  Surgeon: Conrad Hooper, MD;  Location: [place] [facility];  Service: Cardiovascular;  Laterality: N/A;  . PERIPHERAL VASCULAR CATHETERIZATION Right 06/04/2016   Procedure: A/V Shuntogram/Fistulagram;  Surgeon: Conrad [place], MD;  Location: [place] [facility];  Service: Cardiovascular;  Laterality: Right;  . REMOVAL OF A DIALYSIS CATHETER N/A 07/14/2018   Procedure: REMOVAL OF A DIALYSIS CATHETER;  Surgeon:  Algernon Huxley, MD;  Location: [facility];  Service: Vascular;  Laterality: N/A;  . REVISON OF ARTERIOVENOUS FISTULA Right 01/02/2014   Procedure: REVISON OF ARTERIOVENOUS FISTULA ANASTOMOSIS;  Surgeon: Conrad [place], MD;  Location: [place];  Service: Vascular;  Laterality: Right;  . REVISON OF ARTERIOVENOUS FISTULA Right 01/02/2016   Procedure: REVISION OF RADIOCEPHALIC ARTERIOVENOUS FISTULA  with BOVINE PATCH ANGIOPLASTY RIGHT RADIAL ARTERY;  Surgeon: Mal [name], MD;  Location: [place];  Service: Vascular;  Laterality: Right;  . SHUNTOGRAM N/A 11/07/2013   Procedure: Fistulogram;  Surgeon: [name], MD;  Location: [facility] CATH LAB;  Service: Cardiovascular;  Laterality: N/A;    . THROMBECTOMY AND REVISION OF ARTERIOVENTOUS (AV) GORETEX  GRAFT Right 01/20/2018   Procedure: THROMBECTOMY  OF ARTERIOVENTOUS (AV)  HERO GRAFT;  Surgeon: [name], MD;  Location: [place];  Service: Vascular;  Laterality: Right;  . THROMBECTOMY W/ EMBOLECTOMY Right 07/18/2017   Procedure: THROMBECTOMY ARTERIOVENOUS HERO GRAFT ARM;  Surgeon: [name], MD;  Location: [place];  Service: Vascular;  Laterality: Right;  . THROMBECTOMY W/ EMBOLECTOMY Right 03/07/2018   Procedure: THROMBECTOMY OF HERO GRAFT; POSSIBLE REMOVAL;  Surgeon: Conrad , MD;  Location: [place];  Service: Vascular;  Laterality: Right;  . TRANSMETATARSAL AMPUTATION Right 01/01/2017   Procedure: TRANSMETATARSAL AMPUTATION;  Surgeon: [name], MD;  Location: [place];  Service: Vascular;  Laterality: Right;  [name] VASCULAR ACCESS DEVICE INSERTION Right 05/13/2017   Procedure: INSERTION OF HERO VASCULAR ACCESS DEVICE RIGHT UPPER ARM;  Surgeon: Waynetta Sandy, MD;  Location: [place];  Service: Vascular;  Laterality: Right;  . VENOGRAM N/A 05/13/2017   Procedure: CENTRAL VENOGRAM;  Surgeon: Waynetta Sandy, MD;  [facility];  Service: Vascular;  Laterality: N/A;    Current Outpatient Medications  Medication Sig Dispense Refill  . acetaminophen (TYLENOL) 325 MG tablet Take 650 mg by mouth every 6 (six) hours as needed for mild pain. Do not exceed 4 gms of tylenol in 24 hours    . atorvastatin (LIPITOR) 40 MG tablet Take 40 mg by mouth daily.    . B Complex-C-Folic Acid (DIALYVITE PO) Take 1 tablet by mouth every Monday, Wednesday, and Friday.    . calcium acetate (PHOSLO) 667 MG capsule Take 667-2,001 mg by mouth See admin instructions. Take 3 capsules (2001mg ) three times daily with a meal and 1 capsule (667mg ) with a snack.    . carvedilol (COREG) 12.5 MG tablet Take 1 tablet (12.5 mg total) by mouth See admin instructions. Take 1 tablet (12.5 mg) by mouth twice daily on Sunday, Tuesday,  Thursday, Saturday (hold on dialysis days)    . cinacalcet (SENSIPAR) 30 MG tablet Take 30 mg by mouth daily with breakfast.     . clopidogrel (PLAVIX) 75 MG tablet     . colchicine 0.6 MG tablet Take by mouth.    . diclofenac sodium (VOLTAREN) 1 % GEL Apply 2 g topically 4 (four) times daily as needed (pain).   1  . EPINEPHrine (EPIPEN 2-PAK) 0.3 mg/0.3 mL IJ SOAJ injection Inject 0.3 mg into the muscle once as needed (anaphylaxis).    [name] HYDROcodone-acetaminophen (NORCO) 5-325 MG tablet Take 1 tablet by mouth every 6 (six) hours as needed for moderate pain. 30 tablet 0  . LANTUS SOLOSTAR 100 UNIT/ML Solostar Pen Inject 15 Units into the skin at bedtime. 15 mL 0  . omeprazole (PRILOSEC) 40 MG capsule Take 40 mg by mouth daily.    [name]  ramipril (ALTACE) 10 MG capsule Take 10 mg by mouth 2 (two) times daily.    . traMADol (ULTRAM) 50 MG tablet Take by mouth.     No current facility-administered medications for this visit.    Allergies:  Bee venom; Iodine; Penicillins; Plavix [clopidogrel]; and Sulfadiazine   Social History: The patient  reports that he has never smoked. He has never used smokeless tobacco. He reports current alcohol use. He reports that he does not use drugs.   Family History: The patient's family history includes Diabetes in his mother; Heart attack in his brother, brother, and brother; Heart disease in his brother, father, mother, and sister; Hypertension in his brother, father, mother, and sister; Other in his father; Vision loss in his maternal uncle.   ROS:  Please see the history of present illness. Otherwise, complete review of systems is positive for {NONE DEFAULTED:18576::"none"}.  All other systems are reviewed and negative.   Physical Exam: VS:  There were no vitals taken for this visit., BMI There is no height or weight on file to calculate BMI.  Wt Readings from Last 3 Encounters:  09/15/18 274 lb (124.3 kg)  07/14/18 269 lb 6 oz (122.2 kg)  07/12/18 269 lb 6 oz  (122.2 kg)    General: Patient appears comfortable at rest. HEENT: Conjunctiva and lids normal, oropharynx clear with moist mucosa. Neck: Supple, no elevated JVP or carotid bruits, no thyromegaly. Lungs: Clear to auscultation, nonlabored breathing at rest. Cardiac: Regular rate and rhythm, no S3 or significant systolic murmur, no pericardial rub. Abdomen: Soft, nontender, no hepatomegaly, bowel sounds present, no guarding or rebound. Extremities: No pitting edema, distal pulses 2+. Skin: Warm and dry. Musculoskeletal: No kyphosis. Neuropsychiatric: Alert and oriented x3, affect grossly appropriate.  ECG: I personally reviewed the tracing from 06/25/2018 which showed sinus rhythm with prolonged PR interval, left anterior fascicular block, IVCD with poor R wave progression.  Recent Labwork: 06/14/2018: ALT 15; AST 15 07/12/2018: BUN 41; Creatinine, Ser 11.97; Platelets 245 07/14/2018: Hemoglobin 17.0; Potassium 3.5; Sodium 140   Other Studies Reviewed Today:  Echocardiogram 02/17/2018: Study Conclusions  - Left ventricle: The cavity size was normal. Wall thickness was   increased in a pattern of severe LVH. Systolic function was   normal. The estimated ejection fraction was in the range of 55%   to 60%. Wall motion was normal; there were no regional wall   motion abnormalities. Doppler parameters are consistent with   abnormal left ventricular relaxation (grade 1 diastolic   dysfunction). - Aortic valve: Mildly calcified annulus. Trileaflet; mildly   thickened leaflets. Valve area (VTI): 2.42 cm^2. Valve area   (Vmax): 2.67 cm^2. Valve area (Vmean): 2.13 cm^2. - Mitral valve: Mildly calcified annulus. Normal thickness leaflets. - Technically adequate study.  Assessment and Plan:    Current medicines were reviewed with the patient today.  No orders of the defined types were placed in this encounter.   Disposition:  Signed, Satira Sark, MD, [facility] 09/16/2018 3:23 PM     [place] [facility] [place], [place], [place] [address] Phone: [phone]; Fax: [phone]

## 2018-09-19 DIAGNOSIS — D509 Iron deficiency anemia, unspecified: Secondary | ICD-10-CM | POA: Diagnosis not present

## 2018-09-19 DIAGNOSIS — N186 End stage renal disease: Secondary | ICD-10-CM | POA: Diagnosis not present

## 2018-09-19 DIAGNOSIS — Z23 Encounter for immunization: Secondary | ICD-10-CM | POA: Diagnosis not present

## 2018-09-19 DIAGNOSIS — E113593 Type 2 diabetes mellitus with proliferative diabetic retinopathy without macular edema, bilateral: Secondary | ICD-10-CM | POA: Diagnosis not present

## 2018-09-19 DIAGNOSIS — Z992 Dependence on renal dialysis: Secondary | ICD-10-CM | POA: Diagnosis not present

## 2018-09-19 DIAGNOSIS — N2581 Secondary hyperparathyroidism of renal origin: Secondary | ICD-10-CM | POA: Diagnosis not present

## 2018-09-20 ENCOUNTER — Ambulatory Visit: Payer: Medicare Other | Admitting: Cardiology

## 2018-09-20 DIAGNOSIS — H4051X3 Glaucoma secondary to other eye disorders, right eye, severe stage: Secondary | ICD-10-CM | POA: Diagnosis not present

## 2018-09-20 DIAGNOSIS — H2101 Hyphema, right eye: Secondary | ICD-10-CM | POA: Diagnosis not present

## 2018-09-20 DIAGNOSIS — E1021 Type 1 diabetes mellitus with diabetic nephropathy: Secondary | ICD-10-CM | POA: Diagnosis not present

## 2018-09-20 DIAGNOSIS — E113512 Type 2 diabetes mellitus with proliferative diabetic retinopathy with macular edema, left eye: Secondary | ICD-10-CM | POA: Diagnosis not present

## 2018-09-20 DIAGNOSIS — E113511 Type 2 diabetes mellitus with proliferative diabetic retinopathy with macular edema, right eye: Secondary | ICD-10-CM | POA: Diagnosis not present

## 2018-09-20 DIAGNOSIS — E113592 Type 2 diabetes mellitus with proliferative diabetic retinopathy without macular edema, left eye: Secondary | ICD-10-CM | POA: Diagnosis not present

## 2018-09-21 ENCOUNTER — Ambulatory Visit: Payer: Medicare Other | Admitting: "Endocrinology

## 2018-09-21 DIAGNOSIS — N186 End stage renal disease: Secondary | ICD-10-CM | POA: Diagnosis not present

## 2018-09-21 DIAGNOSIS — Z992 Dependence on renal dialysis: Secondary | ICD-10-CM | POA: Diagnosis not present

## 2018-09-21 DIAGNOSIS — N2581 Secondary hyperparathyroidism of renal origin: Secondary | ICD-10-CM | POA: Diagnosis not present

## 2018-09-21 DIAGNOSIS — Z23 Encounter for immunization: Secondary | ICD-10-CM | POA: Diagnosis not present

## 2018-09-21 DIAGNOSIS — D509 Iron deficiency anemia, unspecified: Secondary | ICD-10-CM | POA: Diagnosis not present

## 2018-09-22 DIAGNOSIS — H4052X3 Glaucoma secondary to other eye disorders, left eye, severe stage: Secondary | ICD-10-CM | POA: Diagnosis not present

## 2018-09-22 DIAGNOSIS — H2101 Hyphema, right eye: Secondary | ICD-10-CM | POA: Diagnosis not present

## 2018-09-22 DIAGNOSIS — E113512 Type 2 diabetes mellitus with proliferative diabetic retinopathy with macular edema, left eye: Secondary | ICD-10-CM | POA: Diagnosis not present

## 2018-09-22 DIAGNOSIS — H4051X3 Glaucoma secondary to other eye disorders, right eye, severe stage: Secondary | ICD-10-CM | POA: Diagnosis not present

## 2018-09-23 DIAGNOSIS — N2581 Secondary hyperparathyroidism of renal origin: Secondary | ICD-10-CM | POA: Diagnosis not present

## 2018-09-23 DIAGNOSIS — N186 End stage renal disease: Secondary | ICD-10-CM | POA: Diagnosis not present

## 2018-09-23 DIAGNOSIS — Z23 Encounter for immunization: Secondary | ICD-10-CM | POA: Diagnosis not present

## 2018-09-23 DIAGNOSIS — Z992 Dependence on renal dialysis: Secondary | ICD-10-CM | POA: Diagnosis not present

## 2018-09-23 DIAGNOSIS — D509 Iron deficiency anemia, unspecified: Secondary | ICD-10-CM | POA: Diagnosis not present

## 2018-09-26 ENCOUNTER — Encounter (INDEPENDENT_AMBULATORY_CARE_PROVIDER_SITE_OTHER): Payer: Self-pay | Admitting: Nurse Practitioner

## 2018-09-26 DIAGNOSIS — Z23 Encounter for immunization: Secondary | ICD-10-CM | POA: Diagnosis not present

## 2018-09-26 DIAGNOSIS — Z992 Dependence on renal dialysis: Secondary | ICD-10-CM | POA: Diagnosis not present

## 2018-09-26 DIAGNOSIS — N2581 Secondary hyperparathyroidism of renal origin: Secondary | ICD-10-CM | POA: Diagnosis not present

## 2018-09-26 DIAGNOSIS — N186 End stage renal disease: Secondary | ICD-10-CM | POA: Diagnosis not present

## 2018-09-26 DIAGNOSIS — D509 Iron deficiency anemia, unspecified: Secondary | ICD-10-CM | POA: Diagnosis not present

## 2018-09-26 NOTE — Progress Notes (Signed)
SUBJECTIVE:  Patient ID: Jacob [name], male    DOB: [birth year], 68 y.o.   MRN: [id number] Chief Complaint  Patient presents with  . Follow-up    HPI  Jacob [name] is a 68 y.o. male The patient returns to the office for followup of their dialysis access, a left brachial cephalic AVF placed 46/50/3546, the access has not yet been used. The patient is currently maintained via Permcath.  The patient denies increased bleeding time or increased recirculation. Patient denies difficulty with cannulation. The patient denies hand pain or other symptoms consistent with steal phenomena.  No significant arm swelling.  The patient denies redness or swelling at the access site. The patient denies fever or chills at home or while on dialysis.  The patient denies amaurosis fugax or recent TIA symptoms. There are no recent neurological changes noted. The patient denies claudication symptoms or rest pain symptoms. The patient denies history of DVT, PE or superficial thrombophlebitis. The patient denies recent episodes of angina or shortness of breath.   The patient underwent an HDA and has a flow volume of 1123.  There is no evidence of significant stenosis, with the mid to distal cephalic AVF being moderately tortuous.       Past Medical History:  Diagnosis Date  . Anemia in chronic kidney disease(285.21)   . Anxiety   . Arthritis   . Depression   . ESRD (end stage renal disease) on dialysis [facility])    "MWF; [facility] [place]" (02/18/2017)  . Essential hypertension   . GERD (gastroesophageal reflux disease)   . Glaucoma   . History of blood transfusion   . History of stroke 2016  . Insomnia   . Nonischemic cardiomyopathy ([place])   . Stroke [facility])    mini stroke  . Type 2 diabetes mellitus ([place])     Past Surgical History:  Procedure Laterality Date  . A/V FISTULAGRAM Right 10/06/2016   Procedure: A/V Fistulagram;  Surgeon: [name], MD;  Location: [place] [facility];  Service: Cardiovascular;   Laterality: Right;  . A/V FISTULAGRAM Right 04/27/2017   Procedure: A/V Fistulagram;  Surgeon: [name], MD;  Location: [place] [facility];  Service: Cardiovascular;  Laterality: Right;  . A/V SHUNTOGRAM Right 11/17/2017   Procedure: A/V SHUNTOGRAM;  Surgeon: Waynetta Sandy, MD;  Location: [place] [facility];  Service: Cardiovascular;  Laterality: Right;  . ABDOMINAL AORTOGRAM N/A 12/30/2016   Procedure: Abdominal Aortogram;  Surgeon: Waynetta Sandy, MD;  Location: [place] [facility];  Service: Cardiovascular;  Laterality: N/A;  . ABDOMINAL AORTOGRAM N/A 04/08/2017   Procedure: ABDOMINAL AORTOGRAM;  Surgeon: Conrad Breckenridge, MD;  Location: [place] [facility];  Service: Cardiovascular;  Laterality: N/A;  . ABDOMINAL AORTOGRAM W/LOWER EXTREMITY N/A 02/23/2017   Procedure: Abdominal Aortogram w/Lower Extremity;  Surgeon: [name], MD;  Location: [place] [facility];  Service: Cardiovascular;  Laterality: N/A;  Rt. leg  . AMPUTATION Right 02/28/2017   Procedure: RIGHT BELOW KNEE AMPUTATION;  Surgeon: [name], MD;  Location: [place];  Service: Vascular;  Laterality: Right;  . AV FISTULA PLACEMENT     Hx: of  . AV FISTULA PLACEMENT Right 05/04/2013   Procedure: ARTERIOVENOUS (AV) FISTULA CREATION- RIGHT ARM;  Surgeon: Conrad V[place] Alta, MD;  Location: [place];  Service: Vascular;  Laterality: Right;  Ultrasound guided  . AV FISTULA PLACEMENT Right 07/28/2016   Procedure: BRACHIOCEPHALIC ARTERIOVENOUS (AV) FISTULA CREATION;  Surgeon: [name],  MD;  Location: [place];  Service: Vascular;  Laterality: Right;  . AV FISTULA PLACEMENT Left 06/16/2018   Procedure: ARTERIOVENOUS (AV) FISTULA CREATION ( BRACHIALCEPHALIC );  Surgeon: Algernon Huxley, MD;  Location: [facility];  Service: Vascular;  Laterality: Left;  . BACK SURGERY    . BELOW KNEE LEG AMPUTATION     1 PRIOR AMPUTATION ON FOOT  RT LEG/ FOOT  . CAPD INSERTION N/A 03/11/2018   Procedure: LAPAROSCOPIC INSERTION OF  PERIOTONEAL DIALYSIS CATHETER;  Surgeon: Clovis Riley, MD;  Location: [place];  Service: General;  Laterality: N/A;  . CATARACT EXTRACTION W/PHACO Left 07/16/2014   Procedure: CATARACT EXTRACTION PHACO AND INTRAOCULAR LENS PLACEMENT ([place]);  Surgeon: [name], MD;  Location: [facility];  Service: Ophthalmology;  Laterality: Left;  CDE:8.86  . CATARACT EXTRACTION W/PHACO Right 07/30/2014   Procedure: CATARACT EXTRACTION PHACO AND INTRAOCULAR LENS PLACEMENT (IOC);  Surgeon: [name], MD;  Location: [facility];  Service: Ophthalmology;  Laterality: Right;  CDE 8.99  . COLONOSCOPY     Hx: of  . EYE SURGERY     bilateral cataract  . FISTULA SUPERFICIALIZATION Right 07/28/2016   Procedure: FISTULA SUPERFICIALIZATION;  Surgeon: [name], MD;  Location: [place];  Service: Vascular;  Laterality: Right;  . FISTULA SUPERFICIALIZATION Right 10/13/2016   Procedure: BRACHIOCEPHALIC ARTERIOVENOUS FISTULA SUPERFICIALIZATION;  Surgeon: [name], MD;  Location: [place];  Service: Vascular;  Laterality: Right;  . FISTULOGRAM N/A 05/04/2013   Procedure: CENTRAL VENOGRAM;  Surgeon: Conrad [place], MD;  Location: [place];  Service: Vascular;  Laterality: N/A;  . INSERTION OF DIALYSIS CATHETER Right 05/04/2013   Procedure: INSERTION OF DIALYSIS CATHETER;  Surgeon: Conrad Diamondhead, MD;  Location: [place];  Service: Vascular;  Laterality: Right;  Ultrasound guided  . INSERTION OF DIALYSIS CATHETER N/A 07/28/2016   Procedure: INSERTION OF DIALYSIS CATHETER;  Surgeon: [name], MD;  Location: [place];  Service: Vascular;  Laterality: N/A;  . INSERTION OF DIALYSIS CATHETER Right 03/07/2018   Procedure: INSERTION OF DIALYSIS CATHETER;  Surgeon: Conrad [place]acolt, MD;  Location: [place];  Service: Vascular;  Laterality: Right;  . IR FLUORO GUIDE CV LINE RIGHT  04/13/2017  . IR FLUORO GUIDE CV LINE RIGHT  05/19/2018  . IR THROMBECTOMY AV FISTULA W/THROMBOLYSIS/PTA/STENT INC/SHUNT/IMG RT Right 09/07/2017  . IR US  GUIDE VASC ACCESS RIGHT  04/13/2017  . IR US GUIDE VASC ACCESS RIGHT  09/07/2017  . LIGATION OF ARTERIOVENOUS  FISTULA Left 05/04/2013   Procedure: LIGATION OF LEFT RADIAL CEPHALIC ARTERIOVENOUS  FISTULA;  Surgeon: Conrad Elkhart, MD;  Location: [place];  Service: Vascular;  Laterality: Left;  Ultrasound guided  . LIGATION OF ARTERIOVENOUS  FISTULA Right 07/28/2016   Procedure: LIGATION OF RADIOCEPHALIC ARTERIOVENOUS  FISTULA;  Surgeon: [name], MD;  Location: [place];  Service: Vascular;  Laterality: Right;  . LOWER EXTREMITY ANGIOGRAPHY N/A 12/30/2016   Procedure: Lower Extremity Angiography;  Surgeon: Waynetta Sandy, MD;  Location: [place] [facility];  Service: Cardiovascular;  Laterality: N/A;  . LUMBAR SPINE SURGERY    . PERIPHERAL VASCULAR ATHERECTOMY Right 12/30/2016   Procedure: Peripheral Vascular Atherectomy;  Surgeon: Waynetta Sandy, MD;  Location: [place] [facility];  Service: Cardiovascular;  Laterality: Right;  AT and PT  . PERIPHERAL VASCULAR BALLOON ANGIOPLASTY Right 12/30/2016   Procedure: Peripheral Vascular Balloon Angioplasty;  Surgeon: Waynetta Sandy, MD;  Location: [place] [facility];  Service: Cardiovascular;  Laterality: Right;  PTA of PT and AT  . PERIPHERAL VASCULAR BALLOON ANGIOPLASTY  02/23/2017   Procedure: Peripheral Vascular Balloon Angioplasty;  Surgeon: [name], MD;  Location: [place] [facility];  Service: Cardiovascular;;  RT. Anterior Tib.  [name] PERIPHERAL VASCULAR BALLOON ANGIOPLASTY  04/08/2017   Procedure: PERIPHERAL VASCULAR BALLOON ANGIOPLASTY;  Surgeon: Conrad H[place], MD;  Location: [place] [facility];  Service: Cardiovascular;;  . PERIPHERAL VASCULAR CATHETERIZATION N/A 01/24/2015   Procedure: Fistulagram;  Surgeon: Conrad [place], MD;  Location: [place] [facility];  Service: Cardiovascular;  Laterality: N/A;  . PERIPHERAL VASCULAR CATHETERIZATION Right 06/04/2016   Procedure: A/V Shuntogram/Fistulagram;  Surgeon: Conrad Waverly, MD;  Location: [place] [facility];  Service: Cardiovascular;  Laterality: Right;  . REMOVAL OF A DIALYSIS CATHETER N/A 07/14/2018   Procedure: REMOVAL OF A DIALYSIS CATHETER;  Surgeon: Algernon Huxley, MD;  Location: [facility];  Service: Vascular;  Laterality: N/A;  . REVISON OF ARTERIOVENOUS FISTULA Right 01/02/2014   Procedure: REVISON OF ARTERIOVENOUS FISTULA ANASTOMOSIS;  Surgeon: Conrad Industry, MD;  Location: [place];  Service: Vascular;  Laterality: Right;  . REVISON OF ARTERIOVENOUS FISTULA Right 01/02/2016   Procedure: REVISION OF RADIOCEPHALIC ARTERIOVENOUS FISTULA  with BOVINE PATCH ANGIOPLASTY RIGHT RADIAL ARTERY;  Surgeon: Mal [name], MD;  Location: [place];  Service: Vascular;  Laterality: Right;  . SHUNTOGRAM N/A 11/07/2013   Procedure: Fistulogram;  Surgeon: [name], MD;  Location: [facility] CATH LAB;  Service: Cardiovascular;  Laterality: N/A;  . THROMBECTOMY AND REVISION OF ARTERIOVENTOUS (AV) GORETEX  GRAFT Right 01/20/2018   Procedure: THROMBECTOMY  OF ARTERIOVENTOUS (AV)  HERO GRAFT;  Surgeon: [name], MD;  Location: [place];  Service: Vascular;  Laterality: Right;  . THROMBECTOMY W/ EMBOLECTOMY Right 07/18/2017   Procedure: THROMBECTOMY ARTERIOVENOUS HERO GRAFT ARM;  Surgeon: [name], MD;  Location: [place];  Service: Vascular;  Laterality: Right;  . THROMBECTOMY W/ EMBOLECTOMY Right 03/07/2018   Procedure: THROMBECTOMY OF HERO GRAFT; POSSIBLE REMOVAL;  Surgeon: Conrad , MD;  Location: [place];  Service: Vascular;  Laterality: Right;  . TRANSMETATARSAL AMPUTATION Right 01/01/2017   Procedure: TRANSMETATARSAL AMPUTATION;  Surgeon: [name], MD;  Location: [place];  Service: Vascular;  Laterality: Right;  [name] VASCULAR ACCESS DEVICE INSERTION Right 05/13/2017   Procedure: INSERTION OF HERO VASCULAR ACCESS DEVICE RIGHT UPPER ARM;  Surgeon: Waynetta Sandy, MD;  Location: [place];  Service: Vascular;  Laterality: Right;  . VENOGRAM N/A 05/13/2017    Procedure: CENTRAL VENOGRAM;  Surgeon: Waynetta Sandy, MD;  Location: [facility];  Service: Vascular;  Laterality: N/A;    Social History   Socioeconomic History  . Marital status: Divorced    Spouse name: Not on file  . Number of children: Not on file  . Years of education: Not on file  . Highest education level: Not on file  Occupational History  . Not on file  Social Needs  . Financial resource strain: Not on file  . Food insecurity:    Worry: Not on file    Inability: Not on file  . Transportation needs:    Medical: Not on file    Non-medical: Not on file  Tobacco Use  . Smoking status: Never Smoker  . Smokeless tobacco: Never Used  Substance and Sexual Activity  . Alcohol use: Yes    Comment: 1- fifth of gin a week  . Drug use: No  . Sexual activity: Yes  Birth control/protection: None  Lifestyle  . Physical activity:    Days per week: Not on file    Minutes per session: Not on file  . Stress: Not on file  Relationships  . Social connections:    Talks on phone: Not on file    Gets together: Not on file    Attends religious service: Not on file    Active member of club or organization: Not on file    Attends meetings of clubs or organizations: Not on file    Relationship status: Not on file  . Intimate partner violence:    Fear of current or ex partner: Not on file    Emotionally abused: Not on file    Physically abused: Not on file    Forced sexual activity: Not on file  Other Topics Concern  . Not on file  Social History Narrative  . Not on file    Family History  Problem Relation Age of Onset  . Heart attack Brother        75  . Heart disease Brother        before age 19  . Hypertension Brother   . Heart attack Brother        59  . Heart attack Brother        57  . Diabetes Mother   . Hypertension Mother   . Heart disease Mother   . Heart disease Father   . Hypertension Father   . Other Father        amputation  . Hypertension  Sister   . Heart disease Sister   . Vision loss Maternal Uncle     Allergies  Allergen Reactions  . Bee Venom Anaphylaxis  . Iodine Swelling  . Penicillins Swelling and Other (See Comments)    SWELLING REACTION UNSPECIFIED   Has patient had a PCN reaction causing immediate rash, facial/tongue/throat swelling, SOB or lightheadedness with hypotension: No Has patient had a PCN reaction causing severe rash involving mucus membranes or skin necrosis: No Has patient had a PCN reaction that required hospitalization No Has patient had a PCN reaction occurring within the last 10 years: No If all of the above answers are "NO", then may proceed with Cephalosporin use.  [name] Plavix [Clopidogrel] Other (See Comments)    Caused Nose bleed  . Sulfadiazine Other (See Comments)    1% Silver Sulfadiazine cream causes burning over a large area of skin.     Review of Systems   Review of Systems: Negative Unless Checked Constitutional: [] Weight loss  [] Fever  [] Chills Cardiac: [] Chest pain   []  Atrial Fibrillation  [] Palpitations   [] Shortness of breath when laying flat   [] Shortness of breath with exertion. [] Shortness of breath at rest Vascular:  [] Pain in legs with walking   [] Pain in legs with standing [] Pain in legs when laying flat   [] Claudication    [] Pain in feet when laying flat    [] History of DVT   [] Phlebitis   [] Swelling in legs   [] Varicose veins   [] Non-healing ulcers Pulmonary:   [] Uses home oxygen   [] Productive cough   [] Hemoptysis   [] Wheeze  [] COPD   [] Asthma Neurologic:  [] Dizziness   [] Seizures  [] Blackouts [x] History of stroke   [] History of TIA  [] Aphasia   [] Temporary Blindness   [] Weakness or numbness in arm   [x] Weakness or numbness in leg Musculoskeletal:   [] Joint swelling   [] Joint pain   [] Low back pain  []   History of Knee Replacement [] Arthritis [] back Surgeries  []  Spinal Stenosis    Hematologic:  [] Easy bruising  [] Easy bleeding   [] Hypercoagulable state    [x] Anemic Gastrointestinal:  [] Diarrhea   [] Vomiting  [x] Gastroesophageal reflux/heartburn   [] Difficulty swallowing. [] Abdominal pain Genitourinary:  [x] Chronic kidney disease   [] Difficult urination  [] Anuric   [] Blood in urine [] Frequent urination  [] Burning with urination   [] Hematuria Skin:  [] Rashes   [] Ulcers [] Wounds Psychological:  [x] History of anxiety   []  History of major depression  []  Memory Difficulties      OBJECTIVE:   Physical Exam  BP 110/70 (BP Location: Right Arm, Patient Position: Sitting)   Pulse 83   Resp 18   Ht 6\' 2"  (1.88 m)   Wt 274 lb (124.3 kg)   BMI 35.18 kg/m   Gen: WD/WN, NAD Head: [place]/AT, No temporalis wasting.  Ear/Nose/Throat: Hearing grossly intact, nares w/o erythema or drainage Eyes: PER, EOMI, sclera nonicteric.  Neck: Supple, no masses.  No JVD.  Pulmonary:  Good air movement, no use of accessory muscles.  Cardiac: RRR Vascular: good thrill and bruit Vessel Right Left  Radial Palpable Palpable   Gastrointestinal: soft, non-distended. No guarding/no peritoneal signs.  Musculoskeletal: M/S 5/5 throughout.  r BKA Neurologic: Pain and light touch intact in extremities.  Symmetrical.  Speech is fluent. Motor exam as listed above. Psychiatric: Judgment intact, Mood & affect appropriate for pt's clinical situation. Dermatologic: No Venous rashes. No Ulcers Noted.  No changes consistent with cellulitis. Lymph : No Cervical lymphadenopathy, no lichenification or skin changes of chronic lymphedema.       ASSESSMENT AND PLAN:  1. End-stage renal disease on hemodialysis [facility]) Recommend:  The patient can begin using this access, letter faxed on 09/15/2018. The patient's dialysis center is not reporting any access issues. Flow pattern is stable based on first look.   The patient should have a duplex ultrasound of the dialysis access in 6 months. The patient will follow-up with me in the office after each ultrasound     2. Mixed  hyperlipidemia Continue statin as ordered and reviewed, no changes at this time   3. Essential hypertension, benign Continue antihypertensive medications as already ordered, these medications have been reviewed and there are no changes at this time.    Current Outpatient Medications on File Prior to Visit  Medication Sig Dispense Refill  . acetaminophen (TYLENOL) 325 MG tablet Take 650 mg by mouth every 6 (six) hours as needed for mild pain. Do not exceed 4 gms of tylenol in 24 hours    . atorvastatin (LIPITOR) 40 MG tablet Take 40 mg by mouth daily.    . B Complex-C-Folic Acid (DIALYVITE PO) Take 1 tablet by mouth every Monday, Wednesday, and Friday.    . calcium acetate (PHOSLO) 667 MG capsule Take 667-2,001 mg by mouth See admin instructions. Take 3 capsules (2001mg ) three times daily with a meal and 1 capsule (667mg ) with a snack.    . carvedilol (COREG) 12.5 MG tablet Take 1 tablet (12.5 mg total) by mouth See admin instructions. Take 1 tablet (12.5 mg) by mouth twice daily on Sunday, Tuesday, Thursday, Saturday (hold on dialysis days)    . cinacalcet (SENSIPAR) 30 MG tablet Take 30 mg by mouth daily with breakfast.     . clopidogrel (PLAVIX) 75 MG tablet     . colchicine 0.6 MG tablet Take by mouth.    . diclofenac sodium (VOLTAREN) 1 % GEL Apply 2 g topically 4 (four)  times daily as needed (pain).   1  . EPINEPHrine (EPIPEN 2-PAK) 0.3 mg/0.3 mL IJ SOAJ injection Inject 0.3 mg into the muscle once as needed (anaphylaxis).    [name] HYDROcodone-acetaminophen (NORCO) 5-325 MG tablet Take 1 tablet by mouth every 6 (six) hours as needed for moderate pain. 30 tablet 0  . LANTUS SOLOSTAR 100 UNIT/ML Solostar Pen Inject 15 Units into the skin at bedtime. 15 mL 0  . omeprazole (PRILOSEC) 40 MG capsule Take 40 mg by mouth daily.    . ramipril (ALTACE) 10 MG capsule Take 10 mg by mouth 2 (two) times daily.    . traMADol (ULTRAM) 50 MG tablet Take by mouth.     No current facility-administered  medications on file prior to visit.     There are no Patient Instructions on file for this visit. No follow-ups on file.   Kris Hartmann, NP  This note was completed with Sales executive.  Any errors are purely unintentional.

## 2018-09-27 ENCOUNTER — Other Ambulatory Visit (HOSPITAL_COMMUNITY): Payer: Self-pay | Admitting: Nephrology

## 2018-09-27 DIAGNOSIS — Z992 Dependence on renal dialysis: Principal | ICD-10-CM

## 2018-09-27 DIAGNOSIS — N186 End stage renal disease: Secondary | ICD-10-CM

## 2018-09-28 DIAGNOSIS — N186 End stage renal disease: Secondary | ICD-10-CM | POA: Diagnosis not present

## 2018-09-28 DIAGNOSIS — N2581 Secondary hyperparathyroidism of renal origin: Secondary | ICD-10-CM | POA: Diagnosis not present

## 2018-09-28 DIAGNOSIS — Z992 Dependence on renal dialysis: Secondary | ICD-10-CM | POA: Diagnosis not present

## 2018-09-28 DIAGNOSIS — D509 Iron deficiency anemia, unspecified: Secondary | ICD-10-CM | POA: Diagnosis not present

## 2018-09-28 DIAGNOSIS — Z23 Encounter for immunization: Secondary | ICD-10-CM | POA: Diagnosis not present

## 2018-09-29 ENCOUNTER — Ambulatory Visit (HOSPITAL_COMMUNITY): Admission: RE | Admit: 2018-09-29 | Payer: Medicare Other | Source: Ambulatory Visit

## 2018-09-29 DIAGNOSIS — E113591 Type 2 diabetes mellitus with proliferative diabetic retinopathy without macular edema, right eye: Secondary | ICD-10-CM | POA: Diagnosis not present

## 2018-09-29 DIAGNOSIS — E113592 Type 2 diabetes mellitus with proliferative diabetic retinopathy without macular edema, left eye: Secondary | ICD-10-CM | POA: Diagnosis not present

## 2018-09-29 DIAGNOSIS — H4051X3 Glaucoma secondary to other eye disorders, right eye, severe stage: Secondary | ICD-10-CM | POA: Diagnosis not present

## 2018-09-30 ENCOUNTER — Other Ambulatory Visit (HOSPITAL_COMMUNITY): Payer: Self-pay | Admitting: Radiology

## 2018-09-30 DIAGNOSIS — N2581 Secondary hyperparathyroidism of renal origin: Secondary | ICD-10-CM | POA: Diagnosis not present

## 2018-09-30 DIAGNOSIS — N186 End stage renal disease: Secondary | ICD-10-CM | POA: Diagnosis not present

## 2018-09-30 DIAGNOSIS — Z992 Dependence on renal dialysis: Secondary | ICD-10-CM | POA: Diagnosis not present

## 2018-09-30 DIAGNOSIS — D509 Iron deficiency anemia, unspecified: Secondary | ICD-10-CM | POA: Diagnosis not present

## 2018-09-30 DIAGNOSIS — Z23 Encounter for immunization: Secondary | ICD-10-CM | POA: Diagnosis not present

## 2018-10-01 DIAGNOSIS — Z992 Dependence on renal dialysis: Secondary | ICD-10-CM | POA: Diagnosis not present

## 2018-10-01 DIAGNOSIS — N186 End stage renal disease: Secondary | ICD-10-CM | POA: Diagnosis not present

## 2018-10-03 DIAGNOSIS — N2581 Secondary hyperparathyroidism of renal origin: Secondary | ICD-10-CM | POA: Diagnosis not present

## 2018-10-03 DIAGNOSIS — T82898A Other specified complication of vascular prosthetic devices, implants and grafts, initial encounter: Secondary | ICD-10-CM | POA: Diagnosis not present

## 2018-10-03 DIAGNOSIS — D509 Iron deficiency anemia, unspecified: Secondary | ICD-10-CM | POA: Diagnosis not present

## 2018-10-03 DIAGNOSIS — Z992 Dependence on renal dialysis: Secondary | ICD-10-CM | POA: Diagnosis not present

## 2018-10-03 DIAGNOSIS — N186 End stage renal disease: Secondary | ICD-10-CM | POA: Diagnosis not present

## 2018-10-04 ENCOUNTER — Encounter (INDEPENDENT_AMBULATORY_CARE_PROVIDER_SITE_OTHER): Payer: Medicare Other

## 2018-10-04 ENCOUNTER — Encounter (HOSPITAL_COMMUNITY): Payer: Self-pay | Admitting: Physician Assistant

## 2018-10-04 ENCOUNTER — Ambulatory Visit (HOSPITAL_COMMUNITY)
Admission: RE | Admit: 2018-10-04 | Discharge: 2018-10-04 | Disposition: A | Discharge status: Home / Self Care | Payer: Medicare Other | Source: Ambulatory Visit | Attending: Nephrology | Admitting: Nephrology

## 2018-10-04 ENCOUNTER — Ambulatory Visit (INDEPENDENT_AMBULATORY_CARE_PROVIDER_SITE_OTHER): Payer: Medicare Other | Admitting: Vascular Surgery

## 2018-10-04 DIAGNOSIS — N186 End stage renal disease: Secondary | ICD-10-CM | POA: Diagnosis not present

## 2018-10-04 DIAGNOSIS — Z452 Encounter for adjustment and management of vascular access device: Secondary | ICD-10-CM | POA: Insufficient documentation

## 2018-10-04 DIAGNOSIS — Z992 Dependence on renal dialysis: Secondary | ICD-10-CM | POA: Diagnosis not present

## 2018-10-04 DIAGNOSIS — Z4901 Encounter for fitting and adjustment of extracorporeal dialysis catheter: Secondary | ICD-10-CM | POA: Diagnosis not present

## 2018-10-04 HISTORY — PX: IR REMOVAL TUN CV CATH W/O FL: IMG2289

## 2018-10-04 MED ORDER — LIDOCAINE HCL 1 % IJ SOLN
INTRAMUSCULAR | Status: DC | PRN
  Administered 2018-10-04: 5 mL

## 2018-10-04 MED ORDER — CHLORHEXIDINE GLUCONATE 4 % EX LIQD
CUTANEOUS | Status: AC
  Filled 2018-10-04: qty 15

## 2018-10-04 MED ORDER — LIDOCAINE HCL 1 % IJ SOLN
INTRAMUSCULAR | Status: AC
  Filled 2018-10-04: qty 20

## 2018-10-04 MED ORDER — CHLORHEXIDINE GLUCONATE 4 % EX LIQD
CUTANEOUS | Status: DC | PRN
  Administered 2018-10-04: 1 via TOPICAL

## 2018-10-04 NOTE — Procedures (Signed)
Pre procedural Dx: ESRD Post procedural Dx: Same  Successful removal of tunneled HD catheter  EBL: None No immediate complications.  Please see imaging section of Epic for full dictation.  Joaquim Nam PA-C 10/04/2018 10:18 AM

## 2018-10-05 DIAGNOSIS — Z992 Dependence on renal dialysis: Secondary | ICD-10-CM | POA: Diagnosis not present

## 2018-10-05 DIAGNOSIS — D509 Iron deficiency anemia, unspecified: Secondary | ICD-10-CM | POA: Diagnosis not present

## 2018-10-05 DIAGNOSIS — T82898A Other specified complication of vascular prosthetic devices, implants and grafts, initial encounter: Secondary | ICD-10-CM | POA: Diagnosis not present

## 2018-10-05 DIAGNOSIS — N186 End stage renal disease: Secondary | ICD-10-CM | POA: Diagnosis not present

## 2018-10-05 DIAGNOSIS — N2581 Secondary hyperparathyroidism of renal origin: Secondary | ICD-10-CM | POA: Diagnosis not present

## 2018-10-06 DIAGNOSIS — H4311 Vitreous hemorrhage, right eye: Secondary | ICD-10-CM | POA: Diagnosis not present

## 2018-10-06 DIAGNOSIS — E113512 Type 2 diabetes mellitus with proliferative diabetic retinopathy with macular edema, left eye: Secondary | ICD-10-CM | POA: Diagnosis not present

## 2018-10-07 DIAGNOSIS — Z992 Dependence on renal dialysis: Secondary | ICD-10-CM | POA: Diagnosis not present

## 2018-10-07 DIAGNOSIS — N186 End stage renal disease: Secondary | ICD-10-CM | POA: Diagnosis not present

## 2018-10-07 DIAGNOSIS — D509 Iron deficiency anemia, unspecified: Secondary | ICD-10-CM | POA: Diagnosis not present

## 2018-10-07 DIAGNOSIS — N2581 Secondary hyperparathyroidism of renal origin: Secondary | ICD-10-CM | POA: Diagnosis not present

## 2018-10-07 DIAGNOSIS — T82898A Other specified complication of vascular prosthetic devices, implants and grafts, initial encounter: Secondary | ICD-10-CM | POA: Diagnosis not present

## 2018-10-10 DIAGNOSIS — N2581 Secondary hyperparathyroidism of renal origin: Secondary | ICD-10-CM | POA: Diagnosis not present

## 2018-10-10 DIAGNOSIS — N186 End stage renal disease: Secondary | ICD-10-CM | POA: Diagnosis not present

## 2018-10-10 DIAGNOSIS — T82898A Other specified complication of vascular prosthetic devices, implants and grafts, initial encounter: Secondary | ICD-10-CM | POA: Diagnosis not present

## 2018-10-10 DIAGNOSIS — D509 Iron deficiency anemia, unspecified: Secondary | ICD-10-CM | POA: Diagnosis not present

## 2018-10-10 DIAGNOSIS — Z992 Dependence on renal dialysis: Secondary | ICD-10-CM | POA: Diagnosis not present

## 2018-10-12 DIAGNOSIS — T82898A Other specified complication of vascular prosthetic devices, implants and grafts, initial encounter: Secondary | ICD-10-CM | POA: Diagnosis not present

## 2018-10-12 DIAGNOSIS — N186 End stage renal disease: Secondary | ICD-10-CM | POA: Diagnosis not present

## 2018-10-12 DIAGNOSIS — N2581 Secondary hyperparathyroidism of renal origin: Secondary | ICD-10-CM | POA: Diagnosis not present

## 2018-10-12 DIAGNOSIS — D509 Iron deficiency anemia, unspecified: Secondary | ICD-10-CM | POA: Diagnosis not present

## 2018-10-12 DIAGNOSIS — Z992 Dependence on renal dialysis: Secondary | ICD-10-CM | POA: Diagnosis not present

## 2018-10-14 ENCOUNTER — Encounter (INDEPENDENT_AMBULATORY_CARE_PROVIDER_SITE_OTHER): Payer: Self-pay

## 2018-10-14 DIAGNOSIS — T82898A Other specified complication of vascular prosthetic devices, implants and grafts, initial encounter: Secondary | ICD-10-CM | POA: Diagnosis not present

## 2018-10-14 DIAGNOSIS — Z992 Dependence on renal dialysis: Secondary | ICD-10-CM | POA: Diagnosis not present

## 2018-10-14 DIAGNOSIS — N186 End stage renal disease: Secondary | ICD-10-CM | POA: Diagnosis not present

## 2018-10-14 DIAGNOSIS — D509 Iron deficiency anemia, unspecified: Secondary | ICD-10-CM | POA: Diagnosis not present

## 2018-10-14 DIAGNOSIS — N2581 Secondary hyperparathyroidism of renal origin: Secondary | ICD-10-CM | POA: Diagnosis not present

## 2018-10-17 DIAGNOSIS — N2581 Secondary hyperparathyroidism of renal origin: Secondary | ICD-10-CM | POA: Diagnosis not present

## 2018-10-17 DIAGNOSIS — Z992 Dependence on renal dialysis: Secondary | ICD-10-CM | POA: Diagnosis not present

## 2018-10-17 DIAGNOSIS — D509 Iron deficiency anemia, unspecified: Secondary | ICD-10-CM | POA: Diagnosis not present

## 2018-10-17 DIAGNOSIS — N186 End stage renal disease: Secondary | ICD-10-CM | POA: Diagnosis not present

## 2018-10-17 DIAGNOSIS — T82898A Other specified complication of vascular prosthetic devices, implants and grafts, initial encounter: Secondary | ICD-10-CM | POA: Diagnosis not present

## 2018-10-19 ENCOUNTER — Other Ambulatory Visit (INDEPENDENT_AMBULATORY_CARE_PROVIDER_SITE_OTHER): Payer: Self-pay | Admitting: Nurse Practitioner

## 2018-10-19 DIAGNOSIS — N2581 Secondary hyperparathyroidism of renal origin: Secondary | ICD-10-CM | POA: Diagnosis not present

## 2018-10-19 DIAGNOSIS — D509 Iron deficiency anemia, unspecified: Secondary | ICD-10-CM | POA: Diagnosis not present

## 2018-10-19 DIAGNOSIS — Z992 Dependence on renal dialysis: Secondary | ICD-10-CM | POA: Diagnosis not present

## 2018-10-19 DIAGNOSIS — T82898A Other specified complication of vascular prosthetic devices, implants and grafts, initial encounter: Secondary | ICD-10-CM | POA: Diagnosis not present

## 2018-10-19 DIAGNOSIS — N186 End stage renal disease: Secondary | ICD-10-CM | POA: Diagnosis not present

## 2018-10-19 MED ORDER — CLINDAMYCIN PHOSPHATE 300 MG/50ML IV SOLN: 300.0000 mg | Freq: Once | INTRAVENOUS | Status: DC

## 2018-10-20 ENCOUNTER — Encounter: Payer: Self-pay | Admitting: *Deleted

## 2018-10-20 ENCOUNTER — Encounter
Admission: RE | Disposition: A | Discharge status: Home / Self Care | Payer: Self-pay | Source: Home / Self Care | Attending: Vascular Surgery

## 2018-10-20 ENCOUNTER — Ambulatory Visit
Admission: RE | Admit: 2018-10-20 | Discharge: 2018-10-20 | Disposition: A | Discharge status: Home / Self Care | Payer: Medicare Other | Attending: Vascular Surgery | Admitting: Vascular Surgery

## 2018-10-20 ENCOUNTER — Other Ambulatory Visit: Payer: Self-pay

## 2018-10-20 DIAGNOSIS — N186 End stage renal disease: Secondary | ICD-10-CM

## 2018-10-20 DIAGNOSIS — T82868A Thrombosis of vascular prosthetic devices, implants and grafts, initial encounter: Secondary | ICD-10-CM | POA: Insufficient documentation

## 2018-10-20 DIAGNOSIS — Z8249 Family history of ischemic heart disease and other diseases of the circulatory system: Secondary | ICD-10-CM | POA: Diagnosis not present

## 2018-10-20 DIAGNOSIS — E1122 Type 2 diabetes mellitus with diabetic chronic kidney disease: Secondary | ICD-10-CM | POA: Insufficient documentation

## 2018-10-20 DIAGNOSIS — Z880 Allergy status to penicillin: Secondary | ICD-10-CM | POA: Diagnosis not present

## 2018-10-20 DIAGNOSIS — Z888 Allergy status to other drugs, medicaments and biological substances status: Secondary | ICD-10-CM | POA: Insufficient documentation

## 2018-10-20 DIAGNOSIS — Z95820 Peripheral vascular angioplasty status with implants and grafts: Secondary | ICD-10-CM | POA: Diagnosis not present

## 2018-10-20 DIAGNOSIS — I120 Hypertensive chronic kidney disease with stage 5 chronic kidney disease or end stage renal disease: Secondary | ICD-10-CM | POA: Diagnosis not present

## 2018-10-20 DIAGNOSIS — T82898A Other specified complication of vascular prosthetic devices, implants and grafts, initial encounter: Secondary | ICD-10-CM | POA: Diagnosis not present

## 2018-10-20 DIAGNOSIS — Z833 Family history of diabetes mellitus: Secondary | ICD-10-CM | POA: Insufficient documentation

## 2018-10-20 DIAGNOSIS — I428 Other cardiomyopathies: Secondary | ICD-10-CM | POA: Diagnosis not present

## 2018-10-20 DIAGNOSIS — Z89511 Acquired absence of right leg below knee: Secondary | ICD-10-CM | POA: Diagnosis not present

## 2018-10-20 DIAGNOSIS — H409 Unspecified glaucoma: Secondary | ICD-10-CM | POA: Insufficient documentation

## 2018-10-20 DIAGNOSIS — Y832 Surgical operation with anastomosis, bypass or graft as the cause of abnormal reaction of the patient, or of later complication, without mention of misadventure at the time of the procedure: Secondary | ICD-10-CM | POA: Insufficient documentation

## 2018-10-20 DIAGNOSIS — Z882 Allergy status to sulfonamides status: Secondary | ICD-10-CM | POA: Diagnosis not present

## 2018-10-20 DIAGNOSIS — Z992 Dependence on renal dialysis: Secondary | ICD-10-CM | POA: Diagnosis not present

## 2018-10-20 DIAGNOSIS — M1990 Unspecified osteoarthritis, unspecified site: Secondary | ICD-10-CM | POA: Insufficient documentation

## 2018-10-20 DIAGNOSIS — G4700 Insomnia, unspecified: Secondary | ICD-10-CM | POA: Diagnosis not present

## 2018-10-20 DIAGNOSIS — Z8673 Personal history of transient ischemic attack (TIA), and cerebral infarction without residual deficits: Secondary | ICD-10-CM | POA: Insufficient documentation

## 2018-10-20 HISTORY — PX: A/V FISTULAGRAM: CATH118298

## 2018-10-20 LAB — POTASSIUM (ARMC VASCULAR LAB ONLY): Potassium (ARMC vascular lab): 4.2 (ref 3.5–5.1)

## 2018-10-20 LAB — GLUCOSE, CAPILLARY: Glucose-Capillary: 170 mg/dL — ABNORMAL HIGH (ref 70–99)

## 2018-10-20 SURGERY — A/V FISTULAGRAM
Anesthesia: Moderate Sedation | Laterality: Left

## 2018-10-20 MED ORDER — IOHEXOL 300 MG/ML  SOLN
INTRAMUSCULAR | Status: DC | PRN
  Administered 2018-10-20: 15 mL via INTRAVENOUS

## 2018-10-20 MED ORDER — SODIUM CHLORIDE 0.9 % IV SOLN
INTRAVENOUS | Status: DC
  Administered 2018-10-20: 10:00:00 via INTRAVENOUS

## 2018-10-20 MED ORDER — MIDAZOLAM HCL 5 MG/5ML IJ SOLN
INTRAMUSCULAR | Status: AC
  Filled 2018-10-20: qty 5

## 2018-10-20 MED ORDER — HEPARIN SODIUM (PORCINE) 1000 UNIT/ML IJ SOLN
INTRAMUSCULAR | Status: DC | PRN
  Administered 2018-10-20: 3000 [IU] via INTRAVENOUS

## 2018-10-20 MED ORDER — HEPARIN (PORCINE) IN NACL 1000-0.9 UT/500ML-% IV SOLN
INTRAVENOUS | Status: AC
  Filled 2018-10-20: qty 1000

## 2018-10-20 MED ORDER — MIDAZOLAM HCL 2 MG/ML PO SYRP: 8.0000 mg | ORAL_SOLUTION | Freq: Once | ORAL | Status: DC | PRN

## 2018-10-20 MED ORDER — FENTANYL CITRATE (PF) 100 MCG/2ML IJ SOLN
INTRAMUSCULAR | Status: AC
  Filled 2018-10-20: qty 2

## 2018-10-20 MED ORDER — LIDOCAINE-EPINEPHRINE (PF) 1 %-1:200000 IJ SOLN
INTRAMUSCULAR | Status: AC
  Filled 2018-10-20: qty 30

## 2018-10-20 MED ORDER — DIPHENHYDRAMINE HCL 50 MG/ML IJ SOLN
INTRAMUSCULAR | Status: AC
  Filled 2018-10-20: qty 1

## 2018-10-20 MED ORDER — CLINDAMYCIN PHOSPHATE 300 MG/50ML IV SOLN
INTRAVENOUS | Status: AC
  Administered 2018-10-20: 12:00:00
  Filled 2018-10-20: qty 50

## 2018-10-20 MED ORDER — DIPHENHYDRAMINE HCL 50 MG/ML IJ SOLN: 50.0000 mg | Freq: Once | INTRAMUSCULAR | Status: DC | PRN

## 2018-10-20 MED ORDER — METHYLPREDNISOLONE SODIUM SUCC 125 MG IJ SOLR
INTRAMUSCULAR | Status: AC
  Filled 2018-10-20: qty 2

## 2018-10-20 MED ORDER — ONDANSETRON HCL 4 MG/2ML IJ SOLN: 4.0000 mg | Freq: Four times a day (QID) | INTRAMUSCULAR | Status: DC | PRN

## 2018-10-20 MED ORDER — METHYLPREDNISOLONE SODIUM SUCC 125 MG IJ SOLR
125.0000 mg | Freq: Once | INTRAMUSCULAR | Status: AC | PRN
  Administered 2018-10-20: 125 mg via INTRAVENOUS

## 2018-10-20 MED ORDER — MIDAZOLAM HCL 2 MG/2ML IJ SOLN
INTRAMUSCULAR | Status: DC | PRN
  Administered 2018-10-20: 2 mg via INTRAVENOUS

## 2018-10-20 MED ORDER — FENTANYL CITRATE (PF) 100 MCG/2ML IJ SOLN
INTRAMUSCULAR | Status: DC | PRN
  Administered 2018-10-20: 50 ug via INTRAVENOUS

## 2018-10-20 MED ORDER — DIPHENHYDRAMINE HCL 50 MG/ML IJ SOLN
INTRAMUSCULAR | Status: DC | PRN
  Administered 2018-10-20: 25 mg via INTRAVENOUS

## 2018-10-20 MED ORDER — HEPARIN SODIUM (PORCINE) 1000 UNIT/ML IJ SOLN
INTRAMUSCULAR | Status: AC
  Filled 2018-10-20: qty 1

## 2018-10-20 MED ORDER — HYDROMORPHONE HCL 1 MG/ML IJ SOLN: 1.0000 mg | Freq: Once | INTRAMUSCULAR | Status: DC | PRN

## 2018-10-20 MED ORDER — FAMOTIDINE 20 MG PO TABS
ORAL_TABLET | ORAL | Status: AC
  Filled 2018-10-20: qty 2

## 2018-10-20 MED ORDER — FAMOTIDINE 20 MG PO TABS
40.0000 mg | ORAL_TABLET | Freq: Once | ORAL | Status: AC | PRN
  Administered 2018-10-20: 40 mg via ORAL

## 2018-10-20 SURGICAL SUPPLY — 8 items
CANNULA 5F STIFF (CANNULA) ×2 IMPLANT
DRAPE BRACHIAL (DRAPES) ×2 IMPLANT
PACK ANGIOGRAPHY (CUSTOM PROCEDURE TRAY) ×3 IMPLANT
SHEATH BRITE TIP 6FRX5.5 (SHEATH) ×2 IMPLANT
SUT MNCRL 4-0 (SUTURE) ×3
SUT MNCRL 4-0 27XMFL (SUTURE) ×1
SUTURE MNCRL 4-0 27XMF (SUTURE) IMPLANT
TOWEL OR 17X26 4PK STRL BLUE (TOWEL DISPOSABLE) ×2 IMPLANT

## 2018-10-20 NOTE — Progress Notes (Signed)
Patient's girlfriend left Jacob [name] black bag  with cell phone, bank card.,cash,  etc. In special's recovery waiting room. At time of discharge the girlfriend went to get the bag from the waiting room and the bag was gone. The supervisor, front desk, security , radiology desk were called but no one had turned the bag in. After the patient was discharged,,  someone from ENT office across the road called and the bag was found in the bushes outside their office.  Special's could not find a number for the girl-friend but the sister [name]) was notified, and asked to let Mr. Wacha know.

## 2018-10-20 NOTE — H&P (Signed)
[facility] Admission History & Physical  MRN : [id number]  Jacob [name] is a 68 y.o. ([birth year]) male who presents with chief complaint of No chief complaint on file. [name]  History of Present Illness: I am asked to evaluate the patient by the dialysis center. The patient was sent here because they were unable to achieve adequate dialysis this morning. Furthermore the Center states there is very poor thrill and bruit. The patient states there there have been increasing problems with the access, such as "pulling clots" during dialysis and prolonged bleeding after decannulation. The patient estimates these problems have been going on for several weeks. The patient is unaware of any other change.  Patient denies pain or tenderness overlying the access.  There is no pain with dialysis.  The patient denies hand pain or finger pain consistent with steal syndrome.   There have not been any past interventions or declots of this access but he had multiple previous failed access disease with multiple interventions on those.  The patient is not chronically hypotensive on dialysis.  Current Facility-Administered Medications  Medication Dose Route Frequency Provider Last Rate Last Dose  . clindamycin (CLEOCIN) IVPB 300 mg  300 mg Intravenous Once Kris Hartmann, NP        Past Medical History:  Diagnosis Date  . Anemia in chronic kidney disease(285.21)   . Anxiety   . Arthritis   . Depression   . ESRD (end stage renal disease) on dialysis [facility])    "MWF; [facility] [place]" (02/18/2017)  . Essential hypertension   . GERD (gastroesophageal reflux disease)   . Glaucoma   . History of blood transfusion   . History of stroke 2016  . Insomnia   . Nonischemic cardiomyopathy ([place])   . Stroke [facility])    mini stroke  . Type 2 diabetes mellitus ([place])     Past Surgical History:  Procedure Laterality Date  . A/V FISTULAGRAM Right 10/06/2016   Procedure: A/V Fistulagram;  Surgeon: [name], MD;  Location: [place] [facility];  Service: Cardiovascular;  Laterality: Right;  . A/V FISTULAGRAM Right 04/27/2017   Procedure: A/V Fistulagram;  Surgeon: [name], MD;  Location: [place] [facility];  Service: Cardiovascular;  Laterality: Right;  . A/V SHUNTOGRAM Right 11/17/2017   Procedure: A/V SHUNTOGRAM;  Surgeon: Waynetta Sandy, MD;  Location: [place] [facility];  Service: Cardiovascular;  Laterality: Right;  . ABDOMINAL AORTOGRAM N/A 12/30/2016   Procedure: Abdominal Aortogram;  Surgeon: Waynetta Sandy, MD;  Location: [place] [facility];  Service: Cardiovascular;  Laterality: N/A;  . ABDOMINAL AORTOGRAM N/A 04/08/2017   Procedure: ABDOMINAL AORTOGRAM;  Surgeon: Conrad [place], MD;  Location: [place] [facility];  Service: Cardiovascular;  Laterality: N/A;  . ABDOMINAL AORTOGRAM W/LOWER EXTREMITY N/A 02/23/2017   Procedure: Abdominal Aortogram w/Lower Extremity;  Surgeon: [name], MD;  Location: [place] [facility];  Service: Cardiovascular;  Laterality: N/A;  Rt. leg  . AMPUTATION Right 02/28/2017   Procedure: RIGHT BELOW KNEE AMPUTATION;  Surgeon: [name], MD;  Location: [place];  Service: Vascular;  Laterality: Right;  . AV FISTULA PLACEMENT     Hx: of  . AV FISTULA PLACEMENT Right 05/04/2013   Procedure: ARTERIOVENOUS (AV) FISTULA CREATION- RIGHT ARM;  Surgeon: Conrad , MD;  Location: [place];  Service: Vascular;  Laterality: Right;  Ultrasound guided  . AV FISTULA PLACEMENT Right 07/28/2016   Procedure: BRACHIOCEPHALIC ARTERIOVENOUS (AV)  FISTULA CREATION;  Surgeon: [name], MD;  Location: [place];  Service: Vascular;  Laterality: Right;  . AV FISTULA PLACEMENT Left 06/16/2018   Procedure: ARTERIOVENOUS (AV) FISTULA CREATION ( BRACHIALCEPHALIC );  Surgeon: Algernon Huxley, MD;  Location: [facility];  Service: Vascular;  Laterality: Left;  . BACK SURGERY    . BELOW KNEE LEG AMPUTATION     1 PRIOR AMPUTATION ON FOOT  RT LEG/ FOOT   . CAPD INSERTION N/A 03/11/2018   Procedure: LAPAROSCOPIC INSERTION OF PERIOTONEAL DIALYSIS CATHETER;  Surgeon: Clovis Riley, MD;  Location: [place];  Service: General;  Laterality: N/A;  . CATARACT EXTRACTION W/PHACO Left 07/16/2014   Procedure: CATARACT EXTRACTION PHACO AND INTRAOCULAR LENS PLACEMENT ([place]);  Surgeon: [name], MD;  Location: [facility];  Service: Ophthalmology;  Laterality: Left;  CDE:8.86  . CATARACT EXTRACTION W/PHACO Right 07/30/2014   Procedure: CATARACT EXTRACTION PHACO AND INTRAOCULAR LENS PLACEMENT (I[facility]);  Surgeon: [name], MD;  Location: [facility];  Service: Ophthalmology;  Laterality: Right;  CDE 8.99  . COLONOSCOPY     Hx: of  . EYE SURGERY     bilateral cataract  . FISTULA SUPERFICIALIZATION Right 07/28/2016   Procedure: FISTULA SUPERFICIALIZATION;  Surgeon: [name], MD;  Location: [place];  Service: Vascular;  Laterality: Right;  . FISTULA SUPERFICIALIZATION Right 10/13/2016   Procedure: BRACHIOCEPHALIC ARTERIOVENOUS FISTULA SUPERFICIALIZATION;  Surgeon: [name], MD;  Location: [place];  Service: Vascular;  Laterality: Right;  . FISTULOGRAM N/A 05/04/2013   Procedure: CENTRAL VENOGRAM;  Surgeon: Conrad [place], MD;  Location: [place];  Service: Vascular;  Laterality: N/A;  . INSERTION OF DIALYSIS CATHETER Right 05/04/2013   Procedure: INSERTION OF DIALYSIS CATHETER;  Surgeon: Conrad Timberon, MD;  Location: [place];  Service: Vascular;  Laterality: Right;  Ultrasound guided  . INSERTION OF DIALYSIS CATHETER N/A 07/28/2016   Procedure: INSERTION OF DIALYSIS CATHETER;  Surgeon: [name], MD;  Location: [place];  Service: Vascular;  Laterality: N/A;  . INSERTION OF DIALYSIS CATHETER Right 03/07/2018   Procedure: INSERTION OF DIALYSIS CATHETER;  Surgeon: Conrad Aucilla, MD;  Location: [place];  Service: Vascular;  Laterality: Right;  . IR FLUORO GUIDE CV LINE RIGHT  04/13/2017  . IR FLUORO GUIDE CV LINE RIGHT  05/19/2018  . IR REMOVAL TUN CV CATH  W/O FL  10/04/2018  . IR THROMBECTOMY AV FISTULA W/THROMBOLYSIS/PTA/STENT INC/SHUNT/IMG RT Right 09/07/2017  . IR US GUIDE VASC ACCESS RIGHT  04/13/2017  . IR US GUIDE VASC ACCESS RIGHT  09/07/2017  . LIGATION OF ARTERIOVENOUS  FISTULA Left 05/04/2013   Procedure: LIGATION OF LEFT RADIAL CEPHALIC ARTERIOVENOUS  FISTULA;  Surgeon: Conrad [place], MD;  Location: [place];  Service: Vascular;  Laterality: Left;  Ultrasound guided  . LIGATION OF ARTERIOVENOUS  FISTULA Right 07/28/2016   Procedure: LIGATION OF RADIOCEPHALIC ARTERIOVENOUS  FISTULA;  Surgeon: [name], MD;  Location: [place];  Service: Vascular;  Laterality: Right;  . LOWER EXTREMITY ANGIOGRAPHY N/A 12/30/2016   Procedure: Lower Extremity Angiography;  Surgeon: Waynetta Sandy, MD;  Location: [place] [facility];  Service: Cardiovascular;  Laterality: N/A;  . LUMBAR SPINE SURGERY    . PERIPHERAL VASCULAR ATHERECTOMY Right 12/30/2016   Procedure: Peripheral Vascular Atherectomy;  Surgeon: Waynetta Sandy, MD;  Location: [place] [facility];  Service: Cardiovascular;  Laterality: Right;  AT and PT  . PERIPHERAL VASCULAR BALLOON ANGIOPLASTY Right 12/30/2016   Procedure: Peripheral Vascular Balloon Angioplasty;  Surgeon: Waynetta Sandy, MD;  Location: [place] [facility];  Service: Cardiovascular;  Laterality: Right;  PTA of PT and AT  . PERIPHERAL VASCULAR BALLOON ANGIOPLASTY  02/23/2017   Procedure: Peripheral Vascular Balloon Angioplasty;  Surgeon: [name], MD;  Location: [place] [facility];  Service: Cardiovascular;;  RT. Anterior Tib.  [name] PERIPHERAL VASCULAR BALLOON ANGIOPLASTY  04/08/2017   Procedure: PERIPHERAL VASCULAR BALLOON ANGIOPLASTY;  Surgeon: Conrad [place], MD;  Location: [place] [facility];  Service: Cardiovascular;;  . PERIPHERAL VASCULAR CATHETERIZATION N/A 01/24/2015   Procedure: Fistulagram;  Surgeon: Conrad [place]. [place], MD;  Location: [place] [facility];  Service: Cardiovascular;  Laterality: N/A;   . PERIPHERAL VASCULAR CATHETERIZATION Right 06/04/2016   Procedure: A/V Shuntogram/Fistulagram;  Surgeon: Conrad [place], MD;  Location: [place] [facility];  Service: Cardiovascular;  Laterality: Right;  . REMOVAL OF A DIALYSIS CATHETER N/A 07/14/2018   Procedure: REMOVAL OF A DIALYSIS CATHETER;  Surgeon: Algernon Huxley, MD;  Location: [facility];  Service: Vascular;  Laterality: N/A;  . REVISON OF ARTERIOVENOUS FISTULA Right 01/02/2014   Procedure: REVISON OF ARTERIOVENOUS FISTULA ANASTOMOSIS;  Surgeon: Conrad Crafton, MD;  Location: [place];  Service: Vascular;  Laterality: Right;  . REVISON OF ARTERIOVENOUS FISTULA Right 01/02/2016   Procedure: REVISION OF RADIOCEPHALIC ARTERIOVENOUS FISTULA  with BOVINE PATCH ANGIOPLASTY RIGHT RADIAL ARTERY;  Surgeon: Mal [name], MD;  Location: [place];  Service: Vascular;  Laterality: Right;  . SHUNTOGRAM N/A 11/07/2013   Procedure: Fistulogram;  Surgeon: [name], MD;  Location[facility];  Service: Cardiovascular;  Laterality: N/A;  . THROMBECTOMY AND REVISION OF ARTERIOVENTOUS (AV) GORETEX  GRAFT Right 01/20/2018   Procedure: THROMBECTOMY  OF ARTERIOVENTOUS (AV)  HERO GRAFT;  Surgeon: [name], MD;  Location: [place];  Service: Vascular;  Laterality: Right;  . THROMBECTOMY W/ EMBOLECTOMY Right 07/18/2017   Procedure: THROMBECTOMY ARTERIOVENOUS HERO GRAFT ARM;  Surgeon: [name], MD;  Location: [place];  Service: Vascular;  Laterality: Right;  . THROMBECTOMY W/ EMBOLECTOMY Right 03/07/2018   Procedure: THROMBECTOMY OF HERO GRAFT; POSSIBLE REMOVAL;  Surgeon: Conrad Hollins, MD;  Location: [place];  Service: Vascular;  Laterality: Right;  . TRANSMETATARSAL AMPUTATION Right 01/01/2017   Procedure: TRANSMETATARSAL AMPUTATION;  Surgeon: [name], MD;  Location: [place];  Service: Vascular;  Laterality: Right;  [name] VASCULAR ACCESS DEVICE INSERTION Right 05/13/2017   Procedure: INSERTION OF HERO VASCULAR ACCESS DEVICE RIGHT UPPER ARM;  Surgeon: Waynetta Sandy, MD;  Location: [place];  Service: Vascular;  Laterality: Right;  . VENOGRAM N/A 05/13/2017   Procedure: CENTRAL VENOGRAM;  Surgeon: Waynetta Sandy, MD;  Location[facility];  Service: Vascular;  Laterality: N/A;    Social History Social History   Tobacco Use  . Smoking status: Never Smoker  . Smokeless tobacco: Never Used  Substance Use Topics  . Alcohol use: Yes    Comment: 1- fifth of gin a week  . Drug use: No    Family History Family History  Problem Relation Age of Onset  . Heart attack Brother        74  . Heart disease Brother        before age 21  . Hypertension Brother   . Heart attack Brother        72  . Heart attack Brother        54  . Diabetes Mother   . Hypertension Mother   .  Heart disease Mother   . Heart disease Father   . Hypertension Father   . Other Father        amputation  . Hypertension Sister   . Heart disease Sister   . Vision loss Maternal Uncle     No family history of bleeding or clotting disorders, autoimmune disease or porphyria  Allergies  Allergen Reactions  . Bee Venom Anaphylaxis  . Iodine Swelling  . Penicillins Swelling and Other (See Comments)    SWELLING REACTION UNSPECIFIED   Has patient had a PCN reaction causing immediate rash, facial/tongue/throat swelling, SOB or lightheadedness with hypotension: No Has patient had a PCN reaction causing severe rash involving mucus membranes or skin necrosis: No Has patient had a PCN reaction that required hospitalization No Has patient had a PCN reaction occurring within the last 10 years: No If all of the above answers are "NO", then may proceed with Cephalosporin use.  [name] Plavix [Clopidogrel] Other (See Comments)    Caused Nose bleed  . Sulfadiazine Other (See Comments)    1% Silver Sulfadiazine cream causes burning over a large area of skin.     REVIEW OF SYSTEMS (Negative unless checked)  Constitutional: [] Weight loss  [] Fever  [] Chills Cardiac:  [] Chest pain   [] Chest pressure   [] Palpitations   [] Shortness of breath when laying flat   [] Shortness of breath at rest   [x] Shortness of breath with exertion. Vascular:  [] Pain in legs with walking   [] Pain in legs at rest   [] Pain in legs when laying flat   [] Claudication   [] Pain in feet when walking  [] Pain in feet at rest  [] Pain in feet when laying flat   [] History of DVT   [] Phlebitis   [] Swelling in legs   [] Varicose veins   [] Non-healing ulcers Pulmonary:   [] Uses home oxygen   [] Productive cough   [] Hemoptysis   [] Wheeze  [x] COPD   [] Asthma Neurologic:  [] Dizziness  [] Blackouts   [] Seizures   [x] History of stroke   [x] History of TIA  [] Aphasia   [] Temporary blindness   [] Dysphagia   [] Weakness or numbness in arms   [] Weakness or numbness in legs Musculoskeletal:  [x] Arthritis   [] Joint swelling   [] Joint pain   [] Low back pain Hematologic:  [] Easy bruising  [] Easy bleeding   [] Hypercoagulable state   [] Anemic  [] Hepatitis Gastrointestinal:  [] Blood in stool   [] Vomiting blood  [x] Gastroesophageal reflux/heartburn   [] Difficulty swallowing. Genitourinary:  [x] Chronic kidney disease   [] Difficult urination  [] Frequent urination  [] Burning with urination   [] Blood in urine Skin:  [] Rashes   [] Ulcers   [] Wounds Psychological:  [] History of anxiety   []  History of major depression.  Physical Examination  There were no vitals filed for this visit. There is no height or weight on file to calculate BMI. Gen: WD/WN, NAD Head: [place]/AT, No temporalis wasting. Ear/Nose/Throat: Hearing grossly intact, nares w/o erythema or drainage, oropharynx w/o Erythema/Exudate,  Eyes: Conjunctiva clear, sclera non-icteric Neck: Trachea midline.  No JVD.  Pulmonary:  Good air movement, respirations not labored, no use of accessory muscles.  Cardiac: RRR, normal S1, S2. Vascular: good thrill in left arm AVF Vessel Right Left  Radial Palpable Palpable   Musculoskeletal: M/S 5/5 throughout.  Extremities  without ischemic changes.  No deformity or atrophy.  Neurologic: Sensation grossly intact in extremities.  Symmetrical.  Speech is fluent. Motor exam as listed above. Psychiatric: Judgment intact, Mood & affect appropriate for pt's clinical situation. Dermatologic: No  rashes or ulcers noted.  No cellulitis or open wounds.    CBC Lab Results  Component Value Date   WBC 6.3 07/12/2018   HGB 17.0 07/14/2018   HCT 50.0 07/14/2018   MCV 95.2 07/12/2018   PLT 245 07/12/2018    BMET    Component Value Date/Time   NA 140 07/14/2018 1530   K 3.5 07/14/2018 1530   CL 98 07/12/2018 1351   CO2 21 (L) 07/12/2018 1351   GLUCOSE 78 07/14/2018 1530   BUN 41 (H) 07/12/2018 1351   CREATININE 11.97 (H) 07/12/2018 1351   CALCIUM 7.8 (L) 07/12/2018 1351   GFRNONAA 4 (L) 07/12/2018 1351   GFRAA 4 (L) 07/12/2018 1351   CrCl cannot be calculated (Patient's most recent lab result is older than the maximum 21 days allowed.).  COAG Lab Results  Component Value Date   INR 1.13 07/12/2018   INR 1.10 06/14/2018   INR 1.40 09/07/2017    Radiology Ir Removal Tun Cv Cath W/o Fl  Result Date: 10/04/2018 INDICATION: Patient with history of ESRD via tunneled HD catheter now with working left AVF. Request for removal of tunneled HD catheter today in IR. EXAM: REMOVAL OF TUNNELED HEMODIALYSIS CATHETER MEDICATIONS: 3 mL 1% lidocaine. COMPLICATIONS: None immediate. PROCEDURE: Informed written consent was obtained from the patient following an explanation of the procedure, risks, benefits and alternatives to treatment. A time out was performed prior to the initiation of the procedure. Maximal barrier sterile technique was utilized including caps, mask, sterile gowns, sterile gloves, large sterile drape, hand hygiene, and Hibiclens. 1% lidocaine with epinephrine was injected under sterile conditions along the subcutaneous tunnel. Utilizing a combination of blunt dissection and gentle traction, the catheter was  removed intact. Hemostasis was obtained with manual compression. A dressing was placed. The patient tolerated the procedure well without immediate post procedural complication. IMPRESSION: Successful removal of tunneled dialysis catheter. Read by Candiss Norse, PA-C Electronically Signed   By: Jerilynn Mages.  Shick M.D.   On: 10/04/2018 10:17    Assessment/Plan 1.  Complication dialysis device with dysfunction of AV access:  Patient's left arm AVF dialysis access is malfunctioning. The patient will undergo angiography and correction of any problems using interventional techniques with the hope of restoring function to the access.  The risks and benefits were described to the patient.  All questions were answered.  The patient agrees to proceed with angiography and intervention. Potassium will be drawn to ensure that it is an appropriate level prior to performing intervention. 2.  End-stage renal disease requiring hemodialysis:  Patient will continue dialysis therapy without further interruption if a successful intervention is not achieved then a tunneled catheter will be placed. Dialysis has already been arranged. 3.  Hypertension:  Patient will continue medical management; nephrology is following no changes in oral medications. 4. Diabetes mellitus:  Glucose will be monitored and oral medications been held this morning once the patient has undergone the patient's procedure po intake will be reinitiated and again Accu-Cheks will be used to assess the blood glucose level and treat as needed. The patient will be restarted on the patient's usual hypoglycemic regime     Leotis Pain, MD  10/20/2018 9:59 AM

## 2018-10-20 NOTE — Op Note (Signed)
[facility]    OPERATIVE NOTE   PROCEDURE: 1.   Left brachiocephalic arteriovenous fistula cannulation under ultrasound guidance 2.   Left arm fistulagram including central venogram   PRE-OPERATIVE DIAGNOSIS: 1. ESRD 2. Poorly functional left brachiocephalic AVF  POST-OPERATIVE DIAGNOSIS: same as above   SURGEON: Leotis Pain, MD  ANESTHESIA: local with MCS  ESTIMATED BLOOD LOSS: 3 cc  FINDING(S): 1. Some tortuosity in the upper arm portion of the left brachiocephalic AV fistula without focal stenoses until the termination of the cephalic vein at the cephalic vein subclavian vein confluence.  There, there were 2 stents to cross the confluence of the cephalic vein and subclavian vein with occlusion of the subclavian vein.  There were collaterals around this.  It was clearly not going to be amenable to endovascular therapy with stents crossing the confluence   SPECIMEN(S):  None  CONTRAST: 20 cc  FLUORO TIME: 1.7 minutes  MODERATE CONSCIOUS SEDATION TIME: Approximately 15 minutes with 2 mg of Versed and 50 Mcg of Fentanyl   INDICATIONS: Jacob [name] is a 68 y.o. male who presents with malfunctioning left brachiocephalic arteriovenous fistula.  The patient is scheduled for left arm fistulagram.  The patient is aware the risks include but are not limited to: bleeding, infection, thrombosis of the cannulated access, and possible anaphylactic reaction to the contrast.  The patient is aware of the risks of the procedure and elects to proceed forward.  DESCRIPTION: After full informed written consent was obtained, the patient was brought back to the angiography suite and placed supine upon the angiography table.  The patient was connected to monitoring equipment. Moderate conscious sedation was administered with a face to face encounter with the patient throughout the procedure with my supervision of the RN administering medicines and monitoring the patient's vital  signs and mental status throughout from the start of the procedure until the patient was taken to the recovery room. The left arm was prepped and draped in the standard fashion for a percutaneous access intervention.  Under ultrasound guidance, the left brachiocephalic arteriovenous fistula was cannulated with a micropuncture needle under direct ultrasound guidance where it was patent and a permanent image was performed.  The microwire was advanced into the fistula and the needle was exchanged for the a microsheath.  I then upsized to a 6 Fr Sheath and imaging was performed.  Hand injections were completed to image the access including the central venous system. This demonstrated tortuosity in the upper arm portion of the left brachiocephalic AV fistula without focal stenoses until the termination of the cephalic vein at the cephalic vein subclavian vein confluence.  There, there were 2 stents to cross the confluence of the cephalic vein and subclavian vein with occlusion of the subclavian vein.  There were collaterals around this.  It was clearly not going to be amenable to endovascular therapy with stents crossing the confluence.  Based on the images, the function of this fistula would not be able to be improved and he should continue to use eat as long as it can gain somewhat adequate dialysis.  It is likely he would require a thigh graft if any further access needs are present in the future. A 4-0 Monocryl purse-string suture was sewn around the sheath.  The sheath was removed while tying down the suture.  A sterile bandage was applied to the puncture site.  COMPLICATIONS: None  CONDITION: Stable   Leotis Pain  10/20/2018 12:22 PM   This  note was created with Dragon Medical transcription system. Any errors in dictation are purely unintentional.

## 2018-10-21 ENCOUNTER — Telehealth (INDEPENDENT_AMBULATORY_CARE_PROVIDER_SITE_OTHER): Payer: Self-pay | Admitting: Vascular Surgery

## 2018-10-21 DIAGNOSIS — N186 End stage renal disease: Secondary | ICD-10-CM | POA: Diagnosis not present

## 2018-10-21 DIAGNOSIS — T82898A Other specified complication of vascular prosthetic devices, implants and grafts, initial encounter: Secondary | ICD-10-CM | POA: Diagnosis not present

## 2018-10-21 DIAGNOSIS — N2581 Secondary hyperparathyroidism of renal origin: Secondary | ICD-10-CM | POA: Diagnosis not present

## 2018-10-21 DIAGNOSIS — Z992 Dependence on renal dialysis: Secondary | ICD-10-CM | POA: Diagnosis not present

## 2018-10-21 DIAGNOSIS — D509 Iron deficiency anemia, unspecified: Secondary | ICD-10-CM | POA: Diagnosis not present

## 2018-10-21 NOTE — Telephone Encounter (Signed)
Can we get a picture of the access sent to her?

## 2018-10-22 DIAGNOSIS — T82898A Other specified complication of vascular prosthetic devices, implants and grafts, initial encounter: Secondary | ICD-10-CM | POA: Diagnosis not present

## 2018-10-22 DIAGNOSIS — D509 Iron deficiency anemia, unspecified: Secondary | ICD-10-CM | POA: Diagnosis not present

## 2018-10-22 DIAGNOSIS — N186 End stage renal disease: Secondary | ICD-10-CM | POA: Diagnosis not present

## 2018-10-22 DIAGNOSIS — Z992 Dependence on renal dialysis: Secondary | ICD-10-CM | POA: Diagnosis not present

## 2018-10-22 DIAGNOSIS — N2581 Secondary hyperparathyroidism of renal origin: Secondary | ICD-10-CM | POA: Diagnosis not present

## 2018-10-24 ENCOUNTER — Other Ambulatory Visit: Payer: Self-pay

## 2018-10-24 ENCOUNTER — Telehealth: Payer: Self-pay | Admitting: Vascular Surgery

## 2018-10-24 ENCOUNTER — Inpatient Hospital Stay (HOSPITAL_COMMUNITY)
Admission: EM | Admit: 2018-10-24 | Discharge: 2018-10-26 | DRG: 252 | Disposition: A | Discharge status: Home / Self Care | Payer: Medicare Other | Attending: Internal Medicine | Admitting: Internal Medicine

## 2018-10-24 ENCOUNTER — Encounter (HOSPITAL_COMMUNITY): Payer: Self-pay | Admitting: Emergency Medicine

## 2018-10-24 ENCOUNTER — Telehealth (INDEPENDENT_AMBULATORY_CARE_PROVIDER_SITE_OTHER): Payer: Self-pay | Admitting: Vascular Surgery

## 2018-10-24 DIAGNOSIS — Z992 Dependence on renal dialysis: Secondary | ICD-10-CM

## 2018-10-24 DIAGNOSIS — Z452 Encounter for adjustment and management of vascular access device: Secondary | ICD-10-CM | POA: Diagnosis not present

## 2018-10-24 DIAGNOSIS — Z794 Long term (current) use of insulin: Secondary | ICD-10-CM | POA: Diagnosis not present

## 2018-10-24 DIAGNOSIS — E1151 Type 2 diabetes mellitus with diabetic peripheral angiopathy without gangrene: Secondary | ICD-10-CM | POA: Diagnosis present

## 2018-10-24 DIAGNOSIS — T82868A Thrombosis of vascular prosthetic devices, implants and grafts, initial encounter: Secondary | ICD-10-CM

## 2018-10-24 DIAGNOSIS — K219 Gastro-esophageal reflux disease without esophagitis: Secondary | ICD-10-CM | POA: Diagnosis present

## 2018-10-24 DIAGNOSIS — T82898A Other specified complication of vascular prosthetic devices, implants and grafts, initial encounter: Principal | ICD-10-CM | POA: Diagnosis present

## 2018-10-24 DIAGNOSIS — H409 Unspecified glaucoma: Secondary | ICD-10-CM | POA: Diagnosis present

## 2018-10-24 DIAGNOSIS — I428 Other cardiomyopathies: Secondary | ICD-10-CM

## 2018-10-24 DIAGNOSIS — D649 Anemia, unspecified: Secondary | ICD-10-CM | POA: Diagnosis not present

## 2018-10-24 DIAGNOSIS — Z8673 Personal history of transient ischemic attack (TIA), and cerebral infarction without residual deficits: Secondary | ICD-10-CM | POA: Diagnosis not present

## 2018-10-24 DIAGNOSIS — E119 Type 2 diabetes mellitus without complications: Secondary | ICD-10-CM

## 2018-10-24 DIAGNOSIS — G4700 Insomnia, unspecified: Secondary | ICD-10-CM | POA: Diagnosis present

## 2018-10-24 DIAGNOSIS — N2581 Secondary hyperparathyroidism of renal origin: Secondary | ICD-10-CM | POA: Diagnosis present

## 2018-10-24 DIAGNOSIS — N186 End stage renal disease: Secondary | ICD-10-CM

## 2018-10-24 DIAGNOSIS — D631 Anemia in chronic kidney disease: Secondary | ICD-10-CM | POA: Diagnosis present

## 2018-10-24 DIAGNOSIS — E8889 Other specified metabolic disorders: Secondary | ICD-10-CM | POA: Diagnosis present

## 2018-10-24 DIAGNOSIS — F329 Major depressive disorder, single episode, unspecified: Secondary | ICD-10-CM | POA: Diagnosis not present

## 2018-10-24 DIAGNOSIS — F419 Anxiety disorder, unspecified: Secondary | ICD-10-CM | POA: Diagnosis present

## 2018-10-24 DIAGNOSIS — M7989 Other specified soft tissue disorders: Secondary | ICD-10-CM | POA: Diagnosis present

## 2018-10-24 DIAGNOSIS — Z79899 Other long term (current) drug therapy: Secondary | ICD-10-CM

## 2018-10-24 DIAGNOSIS — Z66 Do not resuscitate: Secondary | ICD-10-CM | POA: Diagnosis present

## 2018-10-24 DIAGNOSIS — Z888 Allergy status to other drugs, medicaments and biological substances status: Secondary | ICD-10-CM

## 2018-10-24 DIAGNOSIS — E782 Mixed hyperlipidemia: Secondary | ICD-10-CM | POA: Diagnosis present

## 2018-10-24 DIAGNOSIS — E039 Hypothyroidism, unspecified: Secondary | ICD-10-CM | POA: Diagnosis present

## 2018-10-24 DIAGNOSIS — Z7902 Long term (current) use of antithrombotics/antiplatelets: Secondary | ICD-10-CM | POA: Diagnosis not present

## 2018-10-24 DIAGNOSIS — I82C22 Chronic embolism and thrombosis of left internal jugular vein: Secondary | ICD-10-CM | POA: Diagnosis present

## 2018-10-24 DIAGNOSIS — E1122 Type 2 diabetes mellitus with diabetic chronic kidney disease: Secondary | ICD-10-CM | POA: Diagnosis present

## 2018-10-24 DIAGNOSIS — Z91030 Bee allergy status: Secondary | ICD-10-CM

## 2018-10-24 DIAGNOSIS — I120 Hypertensive chronic kidney disease with stage 5 chronic kidney disease or end stage renal disease: Secondary | ICD-10-CM | POA: Diagnosis present

## 2018-10-24 DIAGNOSIS — Z89511 Acquired absence of right leg below knee: Secondary | ICD-10-CM

## 2018-10-24 DIAGNOSIS — Z79891 Long term (current) use of opiate analgesic: Secondary | ICD-10-CM

## 2018-10-24 DIAGNOSIS — I739 Peripheral vascular disease, unspecified: Secondary | ICD-10-CM | POA: Diagnosis present

## 2018-10-24 DIAGNOSIS — Y838 Other surgical procedures as the cause of abnormal reaction of the patient, or of later complication, without mention of misadventure at the time of the procedure: Secondary | ICD-10-CM | POA: Diagnosis present

## 2018-10-24 DIAGNOSIS — R918 Other nonspecific abnormal finding of lung field: Secondary | ICD-10-CM | POA: Diagnosis not present

## 2018-10-24 DIAGNOSIS — I10 Essential (primary) hypertension: Secondary | ICD-10-CM | POA: Diagnosis present

## 2018-10-24 DIAGNOSIS — Z9889 Other specified postprocedural states: Secondary | ICD-10-CM

## 2018-10-24 DIAGNOSIS — Z419 Encounter for procedure for purposes other than remedying health state, unspecified: Secondary | ICD-10-CM

## 2018-10-24 DIAGNOSIS — Z833 Family history of diabetes mellitus: Secondary | ICD-10-CM

## 2018-10-24 DIAGNOSIS — Z8249 Family history of ischemic heart disease and other diseases of the circulatory system: Secondary | ICD-10-CM

## 2018-10-24 DIAGNOSIS — Z880 Allergy status to penicillin: Secondary | ICD-10-CM

## 2018-10-24 LAB — GLUCOSE, CAPILLARY
Glucose-Capillary: 181 mg/dL — ABNORMAL HIGH (ref 70–99)
Glucose-Capillary: 258 mg/dL — ABNORMAL HIGH (ref 70–99)

## 2018-10-24 LAB — BASIC METABOLIC PANEL
Anion gap: 18 — ABNORMAL HIGH (ref 5–15)
BUN: 77 mg/dL — ABNORMAL HIGH (ref 8–23)
CHLORIDE: 95 mmol/L — AB (ref 98–111)
CO2: 21 mmol/L — ABNORMAL LOW (ref 22–32)
Calcium: 8 mg/dL — ABNORMAL LOW (ref 8.9–10.3)
Creatinine, Ser: 14.98 mg/dL — ABNORMAL HIGH (ref 0.61–1.24)
GFR calc Af Amer: 3 mL/min — ABNORMAL LOW (ref >60)
GFR calc non Af Amer: 3 mL/min — ABNORMAL LOW (ref >60)
GLUCOSE: 254 mg/dL — AB (ref 70–99)
Potassium: 3.8 mmol/L (ref 3.5–5.1)
Sodium: 134 mmol/L — ABNORMAL LOW (ref 135–145)

## 2018-10-24 LAB — CBC WITH DIFFERENTIAL/PLATELET
Abs Immature Granulocytes: 0.01 10*3/uL (ref 0.00–0.07)
Basophils Absolute: 0.1 10*3/uL (ref 0.0–0.1)
Basophils Relative: 1 %
EOS PCT: 17 %
Eosinophils Absolute: 1 10*3/uL — ABNORMAL HIGH (ref 0.0–0.5)
HCT: 48.8 % (ref 39.0–52.0)
Hemoglobin: 15 g/dL (ref 13.0–17.0)
Immature Granulocytes: 0 %
Lymphocytes Relative: 12 %
Lymphs Abs: 0.7 10*3/uL (ref 0.7–4.0)
MCH: 30.2 pg (ref 26.0–34.0)
MCHC: 30.7 g/dL (ref 30.0–36.0)
MCV: 98.4 fL (ref 80.0–100.0)
Monocytes Absolute: 0.9 10*3/uL (ref 0.1–1.0)
Monocytes Relative: 15 %
Neutro Abs: 3.1 10*3/uL (ref 1.7–7.7)
Neutrophils Relative %: 55 %
Platelets: 210 10*3/uL (ref 150–400)
RBC: 4.96 MIL/uL (ref 4.22–5.81)
RDW: 14.2 % (ref 11.5–15.5)
WBC: 5.6 10*3/uL (ref 4.0–10.5)
nRBC: 0 % (ref 0.0–0.2)

## 2018-10-24 LAB — CBC
HCT: 50.2 % (ref 39.0–52.0)
Hemoglobin: 15.5 g/dL (ref 13.0–17.0)
MCH: 29.7 pg (ref 26.0–34.0)
MCHC: 30.9 g/dL (ref 30.0–36.0)
MCV: 96.2 fL (ref 80.0–100.0)
PLATELETS: 190 10*3/uL (ref 150–400)
RBC: 5.22 MIL/uL (ref 4.22–5.81)
RDW: 14.1 % (ref 11.5–15.5)
WBC: 5.6 10*3/uL (ref 4.0–10.5)
nRBC: 0 % (ref 0.0–0.2)

## 2018-10-24 LAB — MRSA PCR SCREENING: MRSA by PCR: NEGATIVE

## 2018-10-24 LAB — CREATININE, SERUM
Creatinine, Ser: 15.35 mg/dL — ABNORMAL HIGH (ref 0.61–1.24)
GFR calc Af Amer: 3 mL/min — ABNORMAL LOW (ref >60)
GFR calc non Af Amer: 3 mL/min — ABNORMAL LOW (ref >60)

## 2018-10-24 MED ORDER — INSULIN GLARGINE 100 UNIT/ML ~~LOC~~ SOLN
12.0000 [IU] | Freq: Every day | SUBCUTANEOUS | Status: DC
  Administered 2018-10-24 – 2018-10-25 (×2): 12 [IU] via SUBCUTANEOUS
  Filled 2018-10-24 (×3): qty 0.12

## 2018-10-24 MED ORDER — CALCIUM ACETATE (PHOS BINDER) 667 MG PO CAPS
2100.0000 mg | ORAL_CAPSULE | Freq: Three times a day (TID) | ORAL | Status: DC
  Administered 2018-10-24: 2001 mg via ORAL
  Filled 2018-10-24: qty 3

## 2018-10-24 MED ORDER — CALCIUM ACETATE (PHOS BINDER) 667 MG PO CAPS
667.0000 mg | ORAL_CAPSULE | ORAL | Status: DC | PRN
  Administered 2018-10-26: 667 mg via ORAL
  Filled 2018-10-24: qty 1

## 2018-10-24 MED ORDER — CINACALCET HCL 30 MG PO TABS
30.0000 mg | ORAL_TABLET | Freq: Every day | ORAL | Status: DC
  Administered 2018-10-26: 30 mg via ORAL
  Filled 2018-10-24: qty 1

## 2018-10-24 MED ORDER — INSULIN ASPART 100 UNIT/ML ~~LOC~~ SOLN
0.0000 [IU] | Freq: Three times a day (TID) | SUBCUTANEOUS | Status: DC
  Administered 2018-10-24: 2 [IU] via SUBCUTANEOUS

## 2018-10-24 MED ORDER — PANTOPRAZOLE SODIUM 40 MG PO TBEC
40.0000 mg | DELAYED_RELEASE_TABLET | Freq: Every day | ORAL | Status: DC
  Administered 2018-10-24: 40 mg via ORAL
  Filled 2018-10-24: qty 1

## 2018-10-24 MED ORDER — HEPARIN SODIUM (PORCINE) 5000 UNIT/ML IJ SOLN
5000.0000 [IU] | Freq: Three times a day (TID) | INTRAMUSCULAR | Status: DC
  Administered 2018-10-25: 5000 [IU] via SUBCUTANEOUS
  Filled 2018-10-24 (×3): qty 1

## 2018-10-24 MED ORDER — ONDANSETRON HCL 4 MG PO TABS
4.0000 mg | ORAL_TABLET | Freq: Four times a day (QID) | ORAL | Status: DC | PRN
  Administered 2018-10-25: 4 mg via ORAL
  Filled 2018-10-24: qty 1

## 2018-10-24 MED ORDER — HYDROCODONE-ACETAMINOPHEN 5-325 MG PO TABS
1.0000 | ORAL_TABLET | Freq: Four times a day (QID) | ORAL | Status: DC | PRN
  Administered 2018-10-25: 1 via ORAL
  Filled 2018-10-24: qty 1

## 2018-10-24 MED ORDER — ONDANSETRON HCL 4 MG/2ML IJ SOLN: 4.0000 mg | Freq: Four times a day (QID) | INTRAMUSCULAR | Status: DC | PRN

## 2018-10-24 MED ORDER — ACETAMINOPHEN 650 MG RE SUPP: 650.0000 mg | Freq: Four times a day (QID) | RECTAL | Status: DC | PRN

## 2018-10-24 MED ORDER — ATORVASTATIN CALCIUM 40 MG PO TABS
40.0000 mg | ORAL_TABLET | Freq: Every day | ORAL | Status: DC
  Administered 2018-10-24 – 2018-10-25 (×2): 40 mg via ORAL
  Filled 2018-10-24 (×2): qty 1

## 2018-10-24 MED ORDER — ACETAMINOPHEN 325 MG PO TABS
650.0000 mg | ORAL_TABLET | Freq: Four times a day (QID) | ORAL | Status: DC | PRN
  Administered 2018-10-25: 650 mg via ORAL
  Filled 2018-10-24 (×2): qty 2

## 2018-10-24 NOTE — ED Notes (Signed)
Called  [facility] to transport Pt to [place].

## 2018-10-24 NOTE — ED Notes (Signed)
PA working with patient's nephrologist called for update on pt. Stated that we were consulting vascular surgery and are looking to transport pt to [facility] for procedure.

## 2018-10-24 NOTE — ED Notes (Signed)
[facility] [place] went straight to voicemail, will try again after 1300

## 2018-10-24 NOTE — Consult Note (Signed)
Referring Physician: Dr Wynetta Emery  Patient name: Jacob [name] MRN: [id number] DOB: 1950/08/10 Sex: male  REASON FOR CONSULT: poorly functioning hemodialysis access  HPI: Jacob [name] is a 68 y.o. male, with multiple prior access failures.  He could not be dialyzed today due to swelling in left arm.  He had a fistulogram by Dr Lucky Cowboy at [facility] a few days ago that showed occlusion of the left subclavian with multiple stents extending up to this.  Pt has had multiple prior failures in the right arm including a HERO.  He did not get dialysis today. Other medical problems include diabetes arthritis and hypertension.  These are stable.  He was said to have some drainage from fistula but he did not see this.  He is afebrile.  Past Medical History:  Diagnosis Date  . Anemia in chronic kidney disease(285.21)   . Anxiety   . Arthritis   . Depression   . ESRD (end stage renal disease) on dialysis [facility])    "MWF; [facility] [place]" (02/18/2017)  . Essential hypertension   . GERD (gastroesophageal reflux disease)   . Glaucoma   . History of blood transfusion   . History of stroke 2016  . Insomnia   . Nonischemic cardiomyopathy ([place])   . Stroke [facility])    mini stroke  . Type 2 diabetes mellitus ([place])    Past Surgical History:  Procedure Laterality Date  . A/V FISTULAGRAM Right 10/06/2016   Procedure: A/V Fistulagram;  Surgeon: [name], MD;  Location: [place] [facility];  Service: Cardiovascular;  Laterality: Right;  . A/V FISTULAGRAM Right 04/27/2017   Procedure: A/V Fistulagram;  Surgeon: [name], MD;  Location: [place] [facility];  Service: Cardiovascular;  Laterality: Right;  . A/V FISTULAGRAM Left 10/20/2018   Procedure: A/V FISTULAGRAM;  Surgeon: Algernon Huxley, MD;  Location: [place] [facility];  Service: Cardiovascular;  Laterality: Left;  . A/V SHUNTOGRAM Right 11/17/2017   Procedure: A/V SHUNTOGRAM;  Surgeon: Waynetta Sandy, MD;  Location: [place] [facility];  Service:  Cardiovascular;  Laterality: Right;  . ABDOMINAL AORTOGRAM N/A 12/30/2016   Procedure: Abdominal Aortogram;  Surgeon: Waynetta Sandy, MD;  Location: [place] [facility];  Service: Cardiovascular;  Laterality: N/A;  . ABDOMINAL AORTOGRAM N/A 04/08/2017   Procedure: ABDOMINAL AORTOGRAM;  Surgeon: Conrad [place], MD;  Location: [place] [facility];  Service: Cardiovascular;  Laterality: N/A;  . ABDOMINAL AORTOGRAM W/LOWER EXTREMITY N/A 02/23/2017   Procedure: Abdominal Aortogram w/Lower Extremity;  Surgeon: [name], MD;  Location: [place] [facility];  Service: Cardiovascular;  Laterality: N/A;  Rt. leg  . AMPUTATION Right 02/28/2017   Procedure: RIGHT BELOW KNEE AMPUTATION;  Surgeon: [name], MD;  Location: [place];  Service: Vascular;  Laterality: Right;  . AV FISTULA PLACEMENT     Hx: of  . AV FISTULA PLACEMENT Right 05/04/2013   Procedure: ARTERIOVENOUS (AV) FISTULA CREATION- RIGHT ARM;  Surgeon: Conrad [place], MD;  Location: [place];  Service: Vascular;  Laterality: Right;  Ultrasound guided  . AV FISTULA PLACEMENT Right 07/28/2016   Procedure: BRACHIOCEPHALIC ARTERIOVENOUS (AV) FISTULA CREATION;  Surgeon: [name], MD;  Location: [place];  Service: Vascular;  Laterality: Right;  . AV FISTULA PLACEMENT Left 06/16/2018   Procedure: ARTERIOVENOUS (AV) FISTULA CREATION ( BRACHIALCEPHALIC );  Surgeon: Algernon Huxley, MD;  Location: [facility];  Service: Vascular;  Laterality: Left;  . BACK SURGERY    .  BELOW KNEE LEG AMPUTATION     1 PRIOR AMPUTATION ON FOOT  RT LEG/ FOOT  . CAPD INSERTION N/A 03/11/2018   Procedure: LAPAROSCOPIC INSERTION OF PERIOTONEAL DIALYSIS CATHETER;  Surgeon: Clovis Riley, MD;  Location: [place];  Service: General;  Laterality: N/A;  . CATARACT EXTRACTION W/PHACO Left 07/16/2014   Procedure: CATARACT EXTRACTION PHACO AND INTRAOCULAR LENS PLACEMENT ([place]);  Surgeon: [name], MD;  Location: [facility];  Service: Ophthalmology;  Laterality: Left;  CDE:8.86   . CATARACT EXTRACTION W/PHACO Right 07/30/2014   Procedure: CATARACT EXTRACTION PHACO AND INTRAOCULAR LENS PLACEMENT (IOC);  Surgeon: [name], MD;  Location: [facility];  Service: Ophthalmology;  Laterality: Right;  CDE 8.99  . COLONOSCOPY     Hx: of  . EYE SURGERY     bilateral cataract  . FISTULA SUPERFICIALIZATION Right 07/28/2016   Procedure: FISTULA SUPERFICIALIZATION;  Surgeon: [name], MD;  Location: [place];  Service: Vascular;  Laterality: Right;  . FISTULA SUPERFICIALIZATION Right 10/13/2016   Procedure: BRACHIOCEPHALIC ARTERIOVENOUS FISTULA SUPERFICIALIZATION;  Surgeon: [name], MD;  Location: [place];  Service: Vascular;  Laterality: Right;  . FISTULOGRAM N/A 05/04/2013   Procedure: CENTRAL VENOGRAM;  Surgeon: Conrad [place], MD;  Location: [place];  Service: Vascular;  Laterality: N/A;  . INSERTION OF DIALYSIS CATHETER Right 05/04/2013   Procedure: INSERTION OF DIALYSIS CATHETER;  Surgeon: Conrad Vance, MD;  Location: [place];  Service: Vascular;  Laterality: Right;  Ultrasound guided  . INSERTION OF DIALYSIS CATHETER N/A 07/28/2016   Procedure: INSERTION OF DIALYSIS CATHETER;  Surgeon: [name], MD;  Location: [place];  Service: Vascular;  Laterality: N/A;  . INSERTION OF DIALYSIS CATHETER Right 03/07/2018   Procedure: INSERTION OF DIALYSIS CATHETER;  Surgeon: Conrad Sarpy, MD;  Location: [place];  Service: Vascular;  Laterality: Right;  . IR FLUORO GUIDE CV LINE RIGHT  04/13/2017  . IR FLUORO GUIDE CV LINE RIGHT  05/19/2018  . IR REMOVAL TUN CV CATH W/O FL  10/04/2018  . IR THROMBECTOMY AV FISTULA W/THROMBOLYSIS/PTA/STENT INC/SHUNT/IMG RT Right 09/07/2017  . IR US GUIDE VASC ACCESS RIGHT  04/13/2017  . IR US GUIDE VASC ACCESS RIGHT  09/07/2017  . LIGATION OF ARTERIOVENOUS  FISTULA Left 05/04/2013   Procedure: LIGATION OF LEFT RADIAL CEPHALIC ARTERIOVENOUS  FISTULA;  Surgeon: Conrad [place]land, MD;  Location: [place];  Service: Vascular;  Laterality: Left;  Ultrasound  guided  . LIGATION OF ARTERIOVENOUS  FISTULA Right 07/28/2016   Procedure: LIGATION OF RADIOCEPHALIC ARTERIOVENOUS  FISTULA;  Surgeon: [name], MD;  Location: [place];  Service: Vascular;  Laterality: Right;  . LOWER EXTREMITY ANGIOGRAPHY N/A 12/30/2016   Procedure: Lower Extremity Angiography;  Surgeon: Waynetta Sandy, MD;  Location: [place] [facility];  Service: Cardiovascular;  Laterality: N/A;  . LUMBAR SPINE SURGERY    . PERIPHERAL VASCULAR ATHERECTOMY Right 12/30/2016   Procedure: Peripheral Vascular Atherectomy;  Surgeon: Waynetta Sandy, MD;  Location: [place] [facility];  Service: Cardiovascular;  Laterality: Right;  AT and PT  . PERIPHERAL VASCULAR BALLOON ANGIOPLASTY Right 12/30/2016   Procedure: Peripheral Vascular Balloon Angioplasty;  Surgeon: Waynetta Sandy, MD;  Location: [place] [facility];  Service: Cardiovascular;  Laterality: Right;  PTA of PT and AT  . PERIPHERAL VASCULAR BALLOON ANGIOPLASTY  02/23/2017   Procedure: Peripheral Vascular Balloon Angioplasty;  Surgeon: [name], MD;  Location: [place] [facility];  Service: Cardiovascular;;  RT. Anterior Tib.  [name]  PERIPHERAL VASCULAR BALLOON ANGIOPLASTY  04/08/2017   Procedure: PERIPHERAL VASCULAR BALLOON ANGIOPLASTY;  Surgeon: Conrad Carlisle, MD;  Location: [place] [facility];  Service: Cardiovascular;;  . PERIPHERAL VASCULAR CATHETERIZATION N/A 01/24/2015   Procedure: Fistulagram;  Surgeon: Conrad Leander, MD;  Location: [place] [facility];  Service: Cardiovascular;  Laterality: N/A;  . PERIPHERAL VASCULAR CATHETERIZATION Right 06/04/2016   Procedure: A/V Shuntogram/Fistulagram;  Surgeon: Conrad [place], MD;  Location: [place] [facility];  Service: Cardiovascular;  Laterality: Right;  . REMOVAL OF A DIALYSIS CATHETER N/A 07/14/2018   Procedure: REMOVAL OF A DIALYSIS CATHETER;  Surgeon: Algernon Huxley, MD;  Location: [facility];  Service: Vascular;  Laterality: N/A;  . REVISON OF ARTERIOVENOUS  FISTULA Right 01/02/2014   Procedure: REVISON OF ARTERIOVENOUS FISTULA ANASTOMOSIS;  Surgeon: Conrad [place]field, MD;  Location: [place];  Service: Vascular;  Laterality: Right;  . REVISON OF ARTERIOVENOUS FISTULA Right 01/02/2016   Procedure: REVISION OF RADIOCEPHALIC ARTERIOVENOUS FISTULA  with BOVINE PATCH ANGIOPLASTY RIGHT RADIAL ARTERY;  Surgeon: Mal [name], MD;  Location: [place];  Service: Vascular;  Laterality: Right;  . SHUNTOGRAM N/A 11/07/2013   Procedure: Fistulogram;  Surgeon: [name], MD;  Location: [facility];  Service: Cardiovascular;  Laterality: N/A;  . THROMBECTOMY AND REVISION OF ARTERIOVENTOUS (AV) GORETEX  GRAFT Right 01/20/2018   Procedure: THROMBECTOMY  OF ARTERIOVENTOUS (AV)  HERO GRAFT;  Surgeon: [name], MD;  Location: [place];  Service: Vascular;  Laterality: Right;  . THROMBECTOMY W/ EMBOLECTOMY Right 07/18/2017   Procedure: THROMBECTOMY ARTERIOVENOUS HERO GRAFT ARM;  Surgeon: [name], MD;  Location: [place];  Service: Vascular;  Laterality: Right;  . THROMBECTOMY W/ EMBOLECTOMY Right 03/07/2018   Procedure: THROMBECTOMY OF HERO GRAFT; POSSIBLE REMOVAL;  Surgeon: Conrad Bayshore, MD;  Location: [place];  Service: Vascular;  Laterality: Right;  . TRANSMETATARSAL AMPUTATION Right 01/01/2017   Procedure: TRANSMETATARSAL AMPUTATION;  Surgeon: [name], MD;  Location: [place];  Service: Vascular;  Laterality: Right;  [name] VASCULAR ACCESS DEVICE INSERTION Right 05/13/2017   Procedure: INSERTION OF HERO VASCULAR ACCESS DEVICE RIGHT UPPER ARM;  Surgeon: Waynetta Sandy, MD;  Location: [place];  Service: Vascular;  Laterality: Right;  . VENOGRAM N/A 05/13/2017   Procedure: CENTRAL VENOGRAM;  Surgeon: Waynetta Sandy, MD;  Location: [facility];  Service: Vascular;  Laterality: N/A;    Family History  Problem Relation Age of Onset  . Heart attack Brother        22  . Heart disease Brother        before age 29  . Hypertension Brother   . Heart attack  Brother        32  . Heart attack Brother        61  . Diabetes Mother   . Hypertension Mother   . Heart disease Mother   . Heart disease Father   . Hypertension Father   . Other Father        amputation  . Hypertension Sister   . Heart disease Sister   . Vision loss Maternal Uncle     SOCIAL HISTORY: Social History   Socioeconomic History  . Marital status: Divorced    Spouse name: Not on file  . Number of children: Not on file  . Years of education: Not on file  . Highest education level: Not on file  Occupational History  . Not on file  Social Needs  .  Financial resource strain: Not very hard  . Food insecurity:    Worry: Not on file    Inability: Not on file  . Transportation needs:    Medical: No    Non-medical: Not on file  Tobacco Use  . Smoking status: Never Smoker  . Smokeless tobacco: Never Used  Substance and Sexual Activity  . Alcohol use: Yes    Comment: 1- fifth of gin a week  . Drug use: No  . Sexual activity: Yes    Birth control/protection: None  Lifestyle  . Physical activity:    Days per week: 0 days    Minutes per session: Not on file  . Stress: Only a little  Relationships  . Social connections:    Talks on phone: Once a week    Gets together: Not on file    Attends religious service: Not on file    Active member of club or organization: Not on file    Attends meetings of clubs or organizations: Not on file    Relationship status: Not on file  . Intimate partner violence:    Fear of current or ex partner: Not on file    Emotionally abused: Not on file    Physically abused: Not on file    Forced sexual activity: Not on file  Other Topics Concern  . Not on file  Social History Narrative  . Not on file    Allergies  Allergen Reactions  . Bee Venom Anaphylaxis  . Iodine Swelling  . Penicillins Swelling and Other (See Comments)    SWELLING REACTION UNSPECIFIED   Has patient had a PCN reaction causing immediate rash,  facial/tongue/throat swelling, SOB or lightheadedness with hypotension: No Has patient had a PCN reaction causing severe rash involving mucus membranes or skin necrosis: No Has patient had a PCN reaction that required hospitalization No Has patient had a PCN reaction occurring within the last 10 years: No If all of the above answers are "NO", then may proceed with Cephalosporin use.  [name] Plavix [Clopidogrel] Other (See Comments)    Caused Nose bleed  . Sulfadiazine Other (See Comments)    1% Silver Sulfadiazine cream causes burning over a large area of skin.    Current Facility-Administered Medications  Medication Dose Route Frequency Provider Last Rate Last Dose  . acetaminophen (TYLENOL) tablet 650 mg  650 mg Oral Q6H PRN Johnson, Clanford L, MD       Or  . acetaminophen (TYLENOL) suppository 650 mg  650 mg Rectal Q6H PRN Johnson, Clanford L, MD      . atorvastatin (LIPITOR) tablet 40 mg  40 mg Oral q1800 Johnson, Clanford L, MD      . calcium acetate (PHOSLO) capsule 2,001 mg  2,001 mg Oral TID WC Johnson, Clanford L, MD   2,001 mg at 10/24/18 1703  . calcium acetate (PHOSLO) capsule 667 mg  667 mg Oral PRN Skeet Simmer, [facility]      . [START ON 10/25/2018] cinacalcet (SENSIPAR) tablet 30 mg  30 mg Oral Q breakfast Johnson, Clanford L, MD      . Derrill Memo ON 10/25/2018] heparin injection 5,000 Units  5,000 Units Subcutaneous Q8H Johnson, Clanford L, MD      . HYDROcodone-acetaminophen (NORCO/VICODIN) 5-325 MG per tablet 1 tablet  1 tablet Oral Q6H PRN Johnson, Clanford L, MD      . insulin aspart (novoLOG) injection 0-9 Units  0-9 Units Subcutaneous TID WC Johnson, Clanford L, MD   2 Units  at 10/24/18 1704  . insulin glargine (LANTUS) injection 12 Units  12 Units Subcutaneous QHS Johnson, Clanford L, MD      . ondansetron (ZOFRAN) tablet 4 mg  4 mg Oral Q6H PRN Johnson, Clanford L, MD       Or  . ondansetron (ZOFRAN) injection 4 mg  4 mg Intravenous Q6H PRN Johnson, Clanford L, MD      .  pantoprazole (PROTONIX) EC tablet 40 mg  40 mg Oral Daily Johnson, Clanford L, MD   40 mg at 10/24/18 1702    ROS:   General:  No weight loss, Fever, chills  HEENT: No recent headaches, no nasal bleeding, no visual changes, no sore throat  Neurologic: No dizziness, blackouts, seizures. No recent symptoms of stroke or mini- stroke. No recent episodes of slurred speech, or temporary blindness.  Cardiac: No recent episodes of chest pain/pressure, no shortness of breath at rest.  No shortness of breath with exertion.  Denies history of atrial fibrillation or irregular heartbeat  Vascular: No history of rest pain in feet.  No history of claudication.  No history of non-healing ulcer, No history of DVT   Pulmonary: No home oxygen, no productive cough, no hemoptysis,  No asthma or wheezing  Musculoskeletal:  [X]  Arthritis, [ ]  Low back pain,  [X]  Joint pain  Hematologic:No history of hypercoagulable state.  No history of easy bleeding.  No history of anemia  Gastrointestinal: No hematochezia or melena,  No gastroesophageal reflux, no trouble swallowing  Urinary: [X]  chronic Kidney disease, [X]  on HD - [X]  MWF or [ ]  TTHS, [ ]  Burning with urination, [ ]  Frequent urination, [ ]  Difficulty urinating;   Skin: No rashes  Psychological: No history of anxiety,  No history of depression   Physical Examination  Vitals:   10/24/18 1024 10/24/18 1100 10/24/18 1324 10/24/18 1425  BP: (!) 143/58 (!) 119/58 136/67 (!) 148/83  Pulse: 72 71 70 67  Resp: 20  18 18   Temp: 97.6 F (36.4 C)  97.6 F (36.4 C) 97.6 F (36.4 C)  TempSrc: Oral  Oral Oral  SpO2: 99% 95% 100% 100%    There is no height or weight on file to calculate BMI.  General:  Alert and oriented, no acute distress HEENT: Normal Neck: No JVD Pulmonary: Clear to auscultation bilaterally Cardiac: Regular Rate and Rhythm  Abdomen: Soft, non-tender, non-distended, no mass, slightly obese  Skin: No rash Extremity Pulses:   Pulsatile left upper arm AVF with some ecchymosis no erythema or drainage, 2+ femoral pulses bilaterally Musculoskeletal: well healed right BKA  Neurologic: Upper and lower extremity motor 5/5 and symmetric  DATA:  CBC    Component Value Date/Time   WBC 5.6 10/24/2018 1424   RBC 5.22 10/24/2018 1424   HGB 15.5 10/24/2018 1424   HCT 50.2 10/24/2018 1424   PLT 190 10/24/2018 1424   MCV 96.2 10/24/2018 1424   MCH 29.7 10/24/2018 1424   MCHC 30.9 10/24/2018 1424   RDW 14.1 10/24/2018 1424   LYMPHSABS 0.7 10/24/2018 1123   MONOABS 0.9 10/24/2018 1123   EOSABS 1.0 (H) 10/24/2018 1123   BASOSABS 0.1 10/24/2018 1123    BMET    Component Value Date/Time   NA 134 (L) 10/24/2018 1123   K 3.8 10/24/2018 1123   CL 95 (L) 10/24/2018 1123   CO2 21 (L) 10/24/2018 1123   GLUCOSE 254 (H) 10/24/2018 1123   BUN 77 (H) 10/24/2018 1123   CREATININE 15.35 (H) 10/24/2018 1424  CALCIUM 8.0 (L) 10/24/2018 1123   GFRNONAA 3 (L) 10/24/2018 1424   GFRAA 3 (L) 10/24/2018 1424     ASSESSMENT:  Failed upper extremity access sites.   PLAN:  Palindrome cath and right thigh graft by Dr Donzetta Matters tomorrow  NPO p midnight Consent  [name], MD [facility] Office: [phone] Pager: [phone]

## 2018-10-24 NOTE — H&P (Addendum)
History and Physical  Jacob [name] UPJ:[id number] DOB: [birth year] DOA: 10/24/2018  Referring physician: Roderic Palau  PCP: Sharilyn Sites, MD  Nephrologist: Dr. Lowanda Foster Vascular Surgery: Dr. Leotis Pain  Chief Complaint: Problem with left AV Fistula   HPI: Jacob [name] is a 68 y.o. male with ESRD on hemodialysis followed by Dr. Lowanda Foster sent from [facility] from his dialysis treatment today because they were unable to use fistula in left arm due to swelling and discoloration.  The patient was recently discharged from [facility] where he had been admitted for a poorly functional left brachiocephalic AVF.  He was seen by Dr. Lucky Cowboy and went to OR 3/19 for left brachiocephalic arteriovenous fistula cannulation under ultrasound guidance and left arm fistulagram including central venogram.  I offered to send the patient back to [facility] for evaluation by his vascular surgery team but he declined saying he wanted to go to [facility].  Dr. Oneida Alar was consulted at [facility] and agreed to see the patient in consultation when he arrived to [facility].  The patient did not receive hemodialysis treatment today but does not have complaints of chest pain or shortness of breath.  The patient does not have hyperkalemia and does not appear to be grossly volume overloaded.  He will be admitted to a telemetry bed at [facility] for vascular surgery and nephrology consultation.    Review of Systems: All systems reviewed and apart from history of presenting illness, are negative.  Past Medical History:  Diagnosis Date  . Anemia in chronic kidney disease(285.21)   . Anxiety   . Arthritis   . Depression   . ESRD (end stage renal disease) on dialysis [facility])    "MWF; [facility] [place]" (02/18/2017)  . Essential hypertension   . GERD (gastroesophageal reflux disease)   . Glaucoma   . History of blood transfusion   . History of stroke 2016  . Insomnia   . Nonischemic cardiomyopathy ([place])   . Stroke [facility])    mini stroke  . Type 2 diabetes  mellitus ([place])    Past Surgical History:  Procedure Laterality Date  . A/V FISTULAGRAM Right 10/06/2016   Procedure: A/V Fistulagram;  Surgeon: [name], MD;  Location: [place] CV LAB;  Service: Cardiovascular;  Laterality: Right;  . A/V FISTULAGRAM Right 04/27/2017   Procedure: A/V Fistulagram;  Surgeon: [name], MD;  Location: [place] CV LAB;  Service: Cardiovascular;  Laterality: Right;  . A/V FISTULAGRAM Left 10/20/2018   Procedure: A/V FISTULAGRAM;  Surgeon: Algernon Huxley, MD;  Location: [place] CV LAB;  Service: Cardiovascular;  Laterality: Left;  . A/V SHUNTOGRAM Right 11/17/2017   Procedure: A/V SHUNTOGRAM;  Surgeon: Waynetta Sandy, MD;  Location: [place] CV LAB;  Service: Cardiovascular;  Laterality: Right;  . ABDOMINAL AORTOGRAM N/A 12/30/2016   Procedure: Abdominal Aortogram;  Surgeon: Waynetta Sandy, MD;  Location: [place] CV LAB;  Service: Cardiovascular;  Laterality: N/A;  . ABDOMINAL AORTOGRAM N/A 04/08/2017   Procedure: ABDOMINAL AORTOGRAM;  Surgeon: [place] , MD;  Location: [place] CV LAB;  Service: Cardiovascular;  Laterality: N/A;  . ABDOMINAL AORTOGRAM W/LOWER EXTREMITY N/A 02/23/2017   Procedure: Abdominal Aortogram w/Lower Extremity;  Surgeon: [name], MD;  Location: [place] CV LAB;  Service: Cardiovascular;  Laterality: N/A;  Rt. leg  . AMPUTATION Right 02/28/2017   Procedure: RIGHT BELOW KNEE AMPUTATION;  Surgeon: [name], MD;  Location: [place];  Service: Vascular;  Laterality: Right;  .  AV FISTULA PLACEMENT     Hx: of  . AV FISTULA PLACEMENT Right 05/04/2013   Procedure: ARTERIOVENOUS (AV) FISTULA CREATION- RIGHT ARM;  Surgeon: Conrad Randalia, MD;  Location: [place];  Service: Vascular;  Laterality: Right;  Ultrasound guided  . AV FISTULA PLACEMENT Right 07/28/2016   Procedure: BRACHIOCEPHALIC ARTERIOVENOUS (AV) FISTULA CREATION;  Surgeon: [name], MD;  Location: [place];  Service:  Vascular;  Laterality: Right;  . AV FISTULA PLACEMENT Left 06/16/2018   Procedure: ARTERIOVENOUS (AV) FISTULA CREATION ( BRACHIALCEPHALIC );  Surgeon: Algernon Huxley, MD;  Location: [facility];  Service: Vascular;  Laterality: Left;  . BACK SURGERY    . BELOW KNEE LEG AMPUTATION     1 PRIOR AMPUTATION ON FOOT  RT LEG/ FOOT  . CAPD INSERTION N/A 03/11/2018   Procedure: LAPAROSCOPIC INSERTION OF PERIOTONEAL DIALYSIS CATHETER;  Surgeon: Clovis Riley, MD;  Location: [place];  Service: General;  Laterality: N/A;  . CATARACT EXTRACTION W/PHACO Left 07/16/2014   Procedure: CATARACT EXTRACTION PHACO AND INTRAOCULAR LENS PLACEMENT ([place]);  Surgeon: [name], MD;  Location: [facility];  Service: Ophthalmology;  Laterality: Left;  CDE:8.86  . CATARACT EXTRACTION W/PHACO Right 07/30/2014   Procedure: CATARACT EXTRACTION PHACO AND INTRAOCULAR LENS PLACEMENT (IOC);  Surgeon: [name], MD;  Location: [facility];  Service: Ophthalmology;  Laterality: Right;  CDE 8.99  . COLONOSCOPY     Hx: of  . EYE SURGERY     bilateral cataract  . FISTULA SUPERFICIALIZATION Right 07/28/2016   Procedure: FISTULA SUPERFICIALIZATION;  Surgeon: [name], MD;  Location: [place];  Service: Vascular;  Laterality: Right;  . FISTULA SUPERFICIALIZATION Right 10/13/2016   Procedure: BRACHIOCEPHALIC ARTERIOVENOUS FISTULA SUPERFICIALIZATION;  Surgeon: [name], MD;  Location: [place];  Service: Vascular;  Laterality: Right;  . FISTULOGRAM N/A 05/04/2013   Procedure: CENTRAL VENOGRAM;  Surgeon: Conrad [place], MD;  Location: [place];  Service: Vascular;  Laterality: N/A;  . INSERTION OF DIALYSIS CATHETER Right 05/04/2013   Procedure: INSERTION OF DIALYSIS CATHETER;  Surgeon: Conrad [place], MD;  Location: [place];  Service: Vascular;  Laterality: Right;  Ultrasound guided  . INSERTION OF DIALYSIS CATHETER N/A 07/28/2016   Procedure: INSERTION OF DIALYSIS CATHETER;  Surgeon: [name], MD;  Location: [place];  Service:  Vascular;  Laterality: N/A;  . INSERTION OF DIALYSIS CATHETER Right 03/07/2018   Procedure: INSERTION OF DIALYSIS CATHETER;  Surgeon: Conrad [place], MD;  Location: [place];  Service: Vascular;  Laterality: Right;  . IR FLUORO GUIDE CV LINE RIGHT  04/13/2017  . IR FLUORO GUIDE CV LINE RIGHT  05/19/2018  . IR REMOVAL TUN CV CATH W/O FL  10/04/2018  . IR THROMBECTOMY AV FISTULA W/THROMBOLYSIS/PTA/STENT INC/SHUNT/IMG RT Right 09/07/2017  . IR US GUIDE VASC ACCESS RIGHT  04/13/2017  . IR US GUIDE VASC ACCESS RIGHT  09/07/2017  . LIGATION OF ARTERIOVENOUS  FISTULA Left 05/04/2013   Procedure: LIGATION OF LEFT RADIAL CEPHALIC ARTERIOVENOUS  FISTULA;  Surgeon: Conrad [place], MD;  Location: [place];  Service: Vascular;  Laterality: Left;  Ultrasound guided  . LIGATION OF ARTERIOVENOUS  FISTULA Right 07/28/2016   Procedure: LIGATION OF RADIOCEPHALIC ARTERIOVENOUS  FISTULA;  Surgeon: [name], MD;  Location: [place];  Service: Vascular;  Laterality: Right;  . LOWER EXTREMITY ANGIOGRAPHY N/A 12/30/2016   Procedure: Lower Extremity Angiography;  Surgeon: Waynetta Sandy, MD;  Location: [place] CV LAB;  Service: Cardiovascular;  Laterality: N/A;  .  LUMBAR SPINE SURGERY    . PERIPHERAL VASCULAR ATHERECTOMY Right 12/30/2016   Procedure: Peripheral Vascular Atherectomy;  Surgeon: Waynetta Sandy, MD;  Location: [employer] CV LAB;  Service: Cardiovascular;  Laterality: Right;  AT and PT  . PERIPHERAL VASCULAR BALLOON ANGIOPLASTY Right 12/30/2016   Procedure: Peripheral Vascular Balloon Angioplasty;  Surgeon: Waynetta Sandy, MD;  Location: [place] CV [facility];  Service: Cardiovascular;  Laterality: Right;  PTA of PT and AT  . PERIPHERAL VASCULAR BALLOON ANGIOPLASTY  02/23/2017   Procedure: Peripheral Vascular Balloon Angioplasty;  Surgeon: [name], MD;  Location: [place] [facility];  Service: Cardiovascular;;  RT. Anterior Tib.  [name] PERIPHERAL VASCULAR BALLOON ANGIOPLASTY   04/08/2017   Procedure: PERIPHERAL VASCULAR BALLOON ANGIOPLASTY;  Surgeon: Conrad [place], MD;  Location: [place] CV LAB;  Service: Cardiovascular;;  . PERIPHERAL VASCULAR CATHETERIZATION N/A 01/24/2015   Procedure: Fistulagram;  Surgeon: Conrad [place], MD;  Location: [place] [facility];  Service: Cardiovascular;  Laterality: N/A;  . PERIPHERAL VASCULAR CATHETERIZATION Right 06/04/2016   Procedure: A/V Shuntogram/Fistulagram;  Surgeon: Conrad [place], MD;  Location: [place] [facility];  Service: Cardiovascular;  Laterality: Right;  . REMOVAL OF A DIALYSIS CATHETER N/A 07/14/2018   Procedure: REMOVAL OF A DIALYSIS CATHETER;  Surgeon: Algernon Huxley, MD;  Location: [facility];  Service: Vascular;  Laterality: N/A;  . REVISON OF ARTERIOVENOUS FISTULA Right 01/02/2014   Procedure: REVISON OF ARTERIOVENOUS FISTULA ANASTOMOSIS;  Surgeon: Conrad [place], MD;  Location: [place];  Service: Vascular;  Laterality: Right;  . REVISON OF ARTERIOVENOUS FISTULA Right 01/02/2016   Procedure: REVISION OF RADIOCEPHALIC ARTERIOVENOUS FISTULA  with BOVINE PATCH ANGIOPLASTY RIGHT RADIAL ARTERY;  Surgeon: Mal [name], MD;  Location: [place];  Service: Vascular;  Laterality: Right;  . SHUNTOGRAM N/A 11/07/2013   Procedure: Fistulogram;  Surgeon: [name], MD;  Location: [facility] CATH LAB;  Service: Cardiovascular;  Laterality: N/A;  . THROMBECTOMY AND REVISION OF ARTERIOVENTOUS (AV) GORETEX  GRAFT Right 01/20/2018   Procedure: THROMBECTOMY  OF ARTERIOVENTOUS (AV)  HERO GRAFT;  Surgeon: [name], MD;  Location: [place];  Service: Vascular;  Laterality: Right;  . THROMBECTOMY W/ EMBOLECTOMY Right 07/18/2017   Procedure: THROMBECTOMY ARTERIOVENOUS HERO GRAFT ARM;  Surgeon: [name], MD;  Location: [place];  Service: Vascular;  Laterality: Right;  . THROMBECTOMY W/ EMBOLECTOMY Right 03/07/2018   Procedure: THROMBECTOMY OF HERO GRAFT; POSSIBLE REMOVAL;  Surgeon: Conrad [place], MD;  Location: [place];  Service: Vascular;   Laterality: Right;  . TRANSMETATARSAL AMPUTATION Right 01/01/2017   Procedure: TRANSMETATARSAL AMPUTATION;  Surgeon: [name], MD;  Location: [place];  Service: Vascular;  Laterality: Right;  [name] VASCULAR ACCESS DEVICE INSERTION Right 05/13/2017   Procedure: INSERTION OF HERO VASCULAR ACCESS DEVICE RIGHT UPPER ARM;  Surgeon: Waynetta Sandy, MD;  Location: [place];  Service: Vascular;  Laterality: Right;  . VENOGRAM N/A 05/13/2017   Procedure: CENTRAL VENOGRAM;  Surgeon: Waynetta Sandy, MD;  Location: [place];  Service: Vascular;  Laterality: N/A;   Social History:  reports that he has never smoked. He has never used smokeless tobacco. He reports current alcohol use. He reports that he does not use drugs.  Allergies  Allergen Reactions  . Bee Venom Anaphylaxis  . Iodine Swelling  . Penicillins Swelling and Other (See Comments)    SWELLING REACTION UNSPECIFIED   Has patient had a PCN reaction causing immediate rash, facial/tongue/throat swelling, SOB or  lightheadedness with hypotension: No Has patient had a PCN reaction causing severe rash involving mucus membranes or skin necrosis: No Has patient had a PCN reaction that required hospitalization No Has patient had a PCN reaction occurring within the last 10 years: No If all of the above answers are "NO", then may proceed with Cephalosporin use.  [name] Plavix [Clopidogrel] Other (See Comments)    Caused Nose bleed  . Sulfadiazine Other (See Comments)    1% Silver Sulfadiazine cream causes burning over a large area of skin.    Family History  Problem Relation Age of Onset  . Heart attack Brother        48  . Heart disease Brother        before age 16  . Hypertension Brother   . Heart attack Brother        31  . Heart attack Brother        46  . Diabetes Mother   . Hypertension Mother   . Heart disease Mother   . Heart disease Father   . Hypertension Father   . Other Father        amputation  . Hypertension  Sister   . Heart disease Sister   . Vision loss Maternal Uncle     Prior to Admission medications   Medication Sig Start Date End Date Taking? Authorizing Provider  acetaminophen (TYLENOL) 325 MG tablet Take 650 mg by mouth every 6 (six) hours as needed for mild pain. Do not exceed 4 gms of tylenol in 24 hours    [provider]  atorvastatin (LIPITOR) 40 MG tablet Take 40 mg by mouth daily.    [provider]  B Complex-C-Folic Acid (DIALYVITE PO) Take 1 tablet by mouth every Monday, Wednesday, and Friday.    [provider]  calcium acetate (PHOSLO) 667 MG capsule Take 667-2,001 mg by mouth See admin instructions. Take 3 capsules (2001mg ) three times daily with a meal and 1 capsule (667mg ) with a snack. 11/04/17   [provider]  carvedilol (COREG) 12.5 MG tablet Take 1 tablet (12.5 mg total) by mouth See admin instructions. Take 1 tablet (12.5 mg) by mouth twice daily on Sunday, Tuesday, Thursday, Saturday (hold on dialysis days) 04/10/17   Barton Dubois, MD  cinacalcet (SENSIPAR) 30 MG tablet Take 30 mg by mouth daily with breakfast.     [provider]  clopidogrel (PLAVIX) 75 MG tablet  03/11/18   [provider]  colchicine 0.6 MG tablet Take by mouth. 05/19/11   [provider]  diclofenac sodium (VOLTAREN) 1 % GEL Apply 2 g topically 4 (four) times daily as needed (pain).  11/24/17   [provider]  EPINEPHrine (EPIPEN 2-PAK) 0.3 mg/0.3 mL IJ SOAJ injection Inject 0.3 mg into the muscle once as needed (anaphylaxis).    [provider]  HYDROcodone-acetaminophen (NORCO) 5-325 MG tablet Take 1 tablet by mouth every 6 (six) hours as needed for moderate pain. 07/14/18   Algernon Huxley, MD  LANTUS SOLOSTAR 100 UNIT/ML Solostar Pen Inject 15 Units into the skin at bedtime. 03/02/17   Janece Canterbury, MD  omeprazole (PRILOSEC) 40 MG capsule Take 40 mg by mouth daily.    [provider]  ramipril (ALTACE) 10 MG  capsule Take 10 mg by mouth 2 (two) times daily. 10/07/17   [provider]  traMADol Veatrice Bourbon) 50 MG tablet Take by mouth. 05/19/11   [provider]   Physical Exam: Vitals:  10/24/18 1024 10/24/18 1100  BP: (!) 143/58 (!) 119/58  Pulse: 72 71  Resp: 20   Temp: 97.6 F (36.4 C)   TempSrc: Oral   SpO2: 99% 95%     General exam: chronically ill appearing. Moderately built and nourished patient, lying comfortably supine on the gurney in no obvious distress.  Head, eyes and ENT: Nontraumatic and normocephalic. Pupils equally reacting to light and accommodation. Oral mucosa moist.  Neck: Supple. No JVD, carotid bruit or thyromegaly.  Lymphatics: No lymphadenopathy.  Respiratory system: Clear to auscultation. No increased work of breathing.  Cardiovascular system: normal S1 and S2 heard, RRR.  Gastrointestinal system: Abdomen is nondistended, soft and nontender. Normal bowel sounds heard. No organomegaly or masses appreciated.  Central nervous system: Alert and oriented. No focal neurological deficits.  Extremities: left arm swelling near fistula with drainage, no tenderness   Skin: No rashes or acute findings.  Musculoskeletal system: Negative exam.  Psychiatry: Pleasant and cooperative.  Labs on Admission:  Basic Metabolic Panel: Recent Labs  Lab 10/24/18 1123  NA 134*  K 3.8  CL 95*  CO2 21*  GLUCOSE 254*  BUN 77*  CREATININE 14.98*  CALCIUM 8.0*   Liver Function Tests: No results for input(s): AST, ALT, ALKPHOS, BILITOT, PROT, ALBUMIN in the last 168 hours. No results for input(s): LIPASE, AMYLASE in the last 168 hours. No results for input(s): AMMONIA in the last 168 hours. CBC: Recent Labs  Lab 10/24/18 1123  WBC 5.6  NEUTROABS 3.1  HGB 15.0  HCT 48.8  MCV 98.4  PLT 210   Cardiac Enzymes: No results for input(s): CKTOTAL, CKMB, CKMBINDEX, TROPONINI in the last 168 hours.  BNP (last 3 results) No results for input(s): PROBNP in  the last 8760 hours. CBG: Recent Labs  Lab 10/20/18 1019  GLUCAP 170*    Radiological Exams on Admission: No results found.  Assessment/Plan Principal Problem:   Problem with dialysis access [facility]) Active Problems:   Nonischemic cardiomyopathy (HCC)   End-stage renal disease on hemodialysis (HCC)   Essential hypertension, benign   Mixed hyperlipidemia   Insulin-requiring or dependent type II diabetes mellitus (HCC)   Normocytic anemia   PAD (peripheral artery disease) (HCC)   Swelling of arm   Hypothyroidism  1. Problem with dialysis access - Pt had his tunneled HD cath removed earlier this month.  He is being admitted to [facility] for vascular surgery to evaluate his left AV fistula.  Dr. Oneida Alar has agreed to consult when he arrives at [facility].  2. ESRD on hemodialysis - Pt missed his HD today.  He does not appear to need urgent HD at this time.  Will inform nephrology team of his admission.  He may need temporary access placed for HD if the AV fistula is unable to be used.  I spoke with Dr. Marval Regal who agreed to consult on patient at [facility].   3. Type 2 DM, insulin requiring - resume basal insulin and sliding scale coverage.   4. Hypothyroidism - resume home medication.  5. Essential hypertension - holding home medications in case he needs  DVT Prophylaxis: heparin  Code Status: full   Family Communication: patient   Disposition Plan: inpatient   Time spent: 88 minutes  Teddrick Mallari Wynetta Emery, MD [facility] How to contact the [facility] Attending or Consulting provider [place] or covering provider during after hours [place], for this patient?  1. Check the care team in [facility] and look for a) attending/consulting TR[facility]  provider listed and b) the [facility] team listed 2. Log into [email or web address] and use [place]'s universal password to access. If you do not have the password, please contact the hospital operator. 3. Locate the [facility] provider you are looking for under [facility] and  page to a number that you can be directly reached. 4. If you still have difficulty reaching the provider, please page the [facility] (Director on Call) for the Hospitalists listed on amion for assistance.

## 2018-10-24 NOTE — Telephone Encounter (Signed)
Called by [facility] regarding swollen arm on side of AV fistula.  Some possible drainage.  Dr Roderic Palau was going to arrange admission from hospitalist service.  I am available to consult on pt when he arrives at [facility].  [name], MD [facility] [place] Office: [phone] Pager: [phone]

## 2018-10-24 NOTE — ED Provider Notes (Signed)
[facility] EMERGENCY DEPARTMENT Provider Note   CSN: [id number] Arrival date & time: 10/24/18  1005    History   Chief Complaint Chief Complaint  Patient presents with  . Abscess    HPI Jacob [name] is a 68 y.o. male.     Patient presents with dialysis catheter is not working.  And there was some question of pus coming out when he was stuck with a needle.  I spoke with his nephrologist who feels like he needs to be admitted  The history is provided by the patient.  Abscess  Abscess location: Left arm. Abscess quality: not draining   Red streaking: no   Progression:  Unchanged Chronicity:  New Context: diabetes   Relieved by:  Nothing Worsened by:  Nothing Ineffective treatments:  None tried Associated symptoms: no fatigue and no headaches     Past Medical History:  Diagnosis Date  . Anemia in chronic kidney disease(285.21)   . Anxiety   . Arthritis   . Depression   . ESRD (end stage renal disease) on dialysis [facility])    "MWF; [facility] [place]" (02/18/2017)  . Essential hypertension   . GERD (gastroesophageal reflux disease)   . Glaucoma   . History of blood transfusion   . History of stroke 2016  . Insomnia   . Nonischemic cardiomyopathy ([place])   . Stroke [facility])    mini stroke  . Type 2 diabetes mellitus [facility])     Patient Active Problem List   Diagnosis Date Noted  . ESRD (end stage renal disease) ([place]) 07/17/2017  . Hypothyroidism   . Bradycardia 04/09/2017  . Hematoma of groin   . Swelling of arm 03/18/2017  . PAD (peripheral artery disease) ([place])   . Acute blood loss anemia   . Anemia of chronic disease   . DNR (do not resuscitate) 02/24/2017  . Palliative care by specialist 02/24/2017  . Normocytic anemia 02/18/2017  . Hypokalemia 12/28/2016  . Insulin-requiring or dependent type II diabetes mellitus ([place]) 12/28/2016  . Gangrene of foot ([place]) 12/28/2016  . Gangrene of right foot ([place]) 12/28/2016  . Hyperkalemia 04/05/2016  . Inadequate dialysis  04/05/2016  . Other complications due to renal dialysis device, implant, and graft 04/28/2013  . Subclavian vein occlusion (HCC) 04/28/2013  . Preoperative cardiovascular examination 12/28/2012  . Mixed hyperlipidemia 06/16/2011  . Nonischemic cardiomyopathy ([place]) 11/20/2010  . End-stage renal disease on hemodialysis ([place]) 11/20/2010  . Essential hypertension, benign 11/20/2010    Past Surgical History:  Procedure Laterality Date  . A/V FISTULAGRAM Right 10/06/2016   Procedure: A/V Fistulagram;  Surgeon: [name], MD;  Location: [place] CV LAB;  Service: Cardiovascular;  Laterality: Right;  . A/V FISTULAGRAM Right 04/27/2017   Procedure: A/V Fistulagram;  Surgeon: [name], MD;  Location: [place] CV LAB;  Service: Cardiovascular;  Laterality: Right;  . A/V FISTULAGRAM Left 10/20/2018   Procedure: A/V FISTULAGRAM;  Surgeon: Algernon Huxley, MD;  Location: [place] CV LAB;  Service: Cardiovascular;  Laterality: Left;  . A/V SHUNTOGRAM Right 11/17/2017   Procedure: A/V SHUNTOGRAM;  Surgeon: Waynetta Sandy, MD;  Location: [place] CV LAB;  Service: Cardiovascular;  Laterality: Right;  . ABDOMINAL AORTOGRAM N/A 12/30/2016   Procedure: Abdominal Aortogram;  Surgeon: Waynetta Sandy, MD;  Location: [place] CV LAB;  Service: Cardiovascular;  Laterality: N/A;  . ABDOMINAL AORTOGRAM N/A 04/08/2017   Procedure: ABDOMINAL AORTOGRAM;  Surgeon: Conrad Presidio, MD;  Location: [place] CV LAB;  Service: Cardiovascular;  Laterality: N/A;  . ABDOMINAL AORTOGRAM W/LOWER EXTREMITY N/A 02/23/2017   Procedure: Abdominal Aortogram w/Lower Extremity;  Surgeon: [name], MD;  Location: [place] CV LAB;  Service: Cardiovascular;  Laterality: N/A;  Rt. leg  . AMPUTATION Right 02/28/2017   Procedure: RIGHT BELOW KNEE AMPUTATION;  Surgeon: [name], MD;  Location: [place];  Service: Vascular;  Laterality: Right;  . AV FISTULA PLACEMENT     Hx: of  . AV  FISTULA PLACEMENT Right 05/04/2013   Procedure: ARTERIOVENOUS (AV) FISTULA CREATION- RIGHT ARM;  Surgeon: Conrad Boling, MD;  Location: [place];  Service: Vascular;  Laterality: Right;  Ultrasound guided  . AV FISTULA PLACEMENT Right 07/28/2016   Procedure: BRACHIOCEPHALIC ARTERIOVENOUS (AV) FISTULA CREATION;  Surgeon: [name], MD;  Location: [place];  Service: Vascular;  Laterality: Right;  . AV FISTULA PLACEMENT Left 06/16/2018   Procedure: ARTERIOVENOUS (AV) FISTULA CREATION ( BRACHIALCEPHALIC );  Surgeon: Algernon Huxley, MD;  Location: [facility];  Service: Vascular;  Laterality: Left;  . BACK SURGERY    . BELOW KNEE LEG AMPUTATION     1 PRIOR AMPUTATION ON FOOT  RT LEG/ FOOT  . CAPD INSERTION N/A 03/11/2018   Procedure: LAPAROSCOPIC INSERTION OF PERIOTONEAL DIALYSIS CATHETER;  Surgeon: Clovis Riley, MD;  Location: [place];  Service: General;  Laterality: N/A;  . CATARACT EXTRACTION W/PHACO Left 07/16/2014   Procedure: CATARACT EXTRACTION PHACO AND INTRAOCULAR LENS PLACEMENT ([place]);  Surgeon: [name], MD;  Location: [facility];  Service: Ophthalmology;  Laterality: Left;  CDE:8.86  . CATARACT EXTRACTION W/PHACO Right 07/30/2014   Procedure: CATARACT EXTRACTION PHACO AND INTRAOCULAR LENS PLACEMENT (IOC);  Surgeon: [name], MD;  Location: [facility];  Service: Ophthalmology;  Laterality: Right;  CDE 8.99  . COLONOSCOPY     Hx: of  . EYE SURGERY     bilateral cataract  . FISTULA SUPERFICIALIZATION Right 07/28/2016   Procedure: FISTULA SUPERFICIALIZATION;  Surgeon: [name], MD;  Location: [place];  Service: Vascular;  Laterality: Right;  . FISTULA SUPERFICIALIZATION Right 10/13/2016   Procedure: BRACHIOCEPHALIC ARTERIOVENOUS FISTULA SUPERFICIALIZATION;  Surgeon: [name], MD;  Location: [place];  Service: Vascular;  Laterality: Right;  . FISTULOGRAM N/A 05/04/2013   Procedure: CENTRAL VENOGRAM;  Surgeon: Conrad Thomson, MD;  Location: [place];  Service: Vascular;   Laterality: N/A;  . INSERTION OF DIALYSIS CATHETER Right 05/04/2013   Procedure: INSERTION OF DIALYSIS CATHETER;  Surgeon: Conrad [place], MD;  Location: [place];  Service: Vascular;  Laterality: Right;  Ultrasound guided  . INSERTION OF DIALYSIS CATHETER N/A 07/28/2016   Procedure: INSERTION OF DIALYSIS CATHETER;  Surgeon: [name], MD;  Location: [place];  Service: Vascular;  Laterality: N/A;  . INSERTION OF DIALYSIS CATHETER Right 03/07/2018   Procedure: INSERTION OF DIALYSIS CATHETER;  Surgeon: Conrad [place], MD;  Location: [place];  Service: Vascular;  Laterality: Right;  . IR FLUORO GUIDE CV LINE RIGHT  04/13/2017  . IR FLUORO GUIDE CV LINE RIGHT  05/19/2018  . IR REMOVAL TUN CV CATH W/O FL  10/04/2018  . IR THROMBECTOMY AV FISTULA W/THROMBOLYSIS/PTA/STENT INC/SHUNT/IMG RT Right 09/07/2017  . IR US GUIDE VASC ACCESS RIGHT  04/13/2017  . IR US GUIDE VASC ACCESS RIGHT  09/07/2017  . LIGATION OF ARTERIOVENOUS  FISTULA Left 05/04/2013   Procedure: LIGATION OF LEFT RADIAL CEPHALIC ARTERIOVENOUS  FISTULA;  Surgeon: Conrad [place], MD;  Location: [place];  Service: Vascular;  Laterality: Left;  Ultrasound guided  . LIGATION OF ARTERIOVENOUS  FISTULA Right 07/28/2016   Procedure: LIGATION OF RADIOCEPHALIC ARTERIOVENOUS  FISTULA;  Surgeon: [name], MD;  Location: [place];  Service: Vascular;  Laterality: Right;  . LOWER EXTREMITY ANGIOGRAPHY N/A 12/30/2016   Procedure: Lower Extremity Angiography;  Surgeon: Waynetta Sandy, MD;  Location: [place] CV LAB;  Service: Cardiovascular;  Laterality: N/A;  . LUMBAR SPINE SURGERY    . PERIPHERAL VASCULAR ATHERECTOMY Right 12/30/2016   Procedure: Peripheral Vascular Atherectomy;  Surgeon: Waynetta Sandy, MD;  Location: [place] CV LAB;  Service: Cardiovascular;  Laterality: Right;  AT and PT  . PERIPHERAL VASCULAR BALLOON ANGIOPLASTY Right 12/30/2016   Procedure: Peripheral Vascular Balloon Angioplasty;  Surgeon: Waynetta Sandy, MD;  Location: [place] CV LAB;  Service: Cardiovascular;  Laterality: Right;  PTA of PT and AT  . PERIPHERAL VASCULAR BALLOON ANGIOPLASTY  02/23/2017   Procedure: Peripheral Vascular Balloon Angioplasty;  Surgeon: [name], MD;  Location: [place] CV LAB;  Service: Cardiovascular;;  RT. Anterior Tib.  [name] PERIPHERAL VASCULAR BALLOON ANGIOPLASTY  04/08/2017   Procedure: PERIPHERAL VASCULAR BALLOON ANGIOPLASTY;  Surgeon: Conrad [place], MD;  Location: [place] CV LAB;  Service: Cardiovascular;;  . PERIPHERAL VASCULAR CATHETERIZATION N/A 01/24/2015   Procedure: Fistulagram;  Surgeon: Conrad Addison, MD;  Location: [place] CV LAB;  Service: Cardiovascular;  Laterality: N/A;  . PERIPHERAL VASCULAR CATHETERIZATION Right 06/04/2016   Procedure: A/V Shuntogram/Fistulagram;  Surgeon: Conrad [place], MD;  Location: [place] CV LAB;  Service: Cardiovascular;  Laterality: Right;  . REMOVAL OF A DIALYSIS CATHETER N/A 07/14/2018   Procedure: REMOVAL OF A DIALYSIS CATHETER;  Surgeon: Algernon Huxley, MD;  Location: [facility];  Service: Vascular;  Laterality: N/A;  . REVISON OF ARTERIOVENOUS FISTULA Right 01/02/2014   Procedure: REVISON OF ARTERIOVENOUS FISTULA ANASTOMOSIS;  Surgeon: Conrad [place], MD;  Location: [place];  Service: Vascular;  Laterality: Right;  . REVISON OF ARTERIOVENOUS FISTULA Right 01/02/2016   Procedure: REVISION OF RADIOCEPHALIC ARTERIOVENOUS FISTULA  with BOVINE PATCH ANGIOPLASTY RIGHT RADIAL ARTERY;  Surgeon: Mal [name], MD;  Location: [place];  Service: Vascular;  Laterality: Right;  . SHUNTOGRAM N/A 11/07/2013   Procedure: Fistulogram;  Surgeon: [name], MD;  Location: [facility] CATH LAB;  Service: Cardiovascular;  Laterality: N/A;  . THROMBECTOMY AND REVISION OF ARTERIOVENTOUS (AV) GORETEX  GRAFT Right 01/20/2018   Procedure: THROMBECTOMY  OF ARTERIOVENTOUS (AV)  HERO GRAFT;  Surgeon: [name], MD;  Location: [place];  Service: Vascular;  Laterality: Right;   . THROMBECTOMY W/ EMBOLECTOMY Right 07/18/2017   Procedure: THROMBECTOMY ARTERIOVENOUS HERO GRAFT ARM;  Surgeon: [name], MD;  Location: [place];  Service: Vascular;  Laterality: Right;  . THROMBECTOMY W/ EMBOLECTOMY Right 03/07/2018   Procedure: THROMBECTOMY OF HERO GRAFT; POSSIBLE REMOVAL;  Surgeon: Conrad [place], MD;  Location: [place];  Service: Vascular;  Laterality: Right;  . TRANSMETATARSAL AMPUTATION Right 01/01/2017   Procedure: TRANSMETATARSAL AMPUTATION;  Surgeon: [name], MD;  Location: [place];  Service: Vascular;  Laterality: Right;  [name] VASCULAR ACCESS DEVICE INSERTION Right 05/13/2017   Procedure: INSERTION OF HERO VASCULAR ACCESS DEVICE RIGHT UPPER ARM;  Surgeon: Waynetta Sandy, MD;  Location: [place];  Service: Vascular;  Laterality: Right;  . VENOGRAM N/A 05/13/2017   Procedure: CENTRAL VENOGRAM;  Surgeon: Waynetta Sandy, MD;  Location: [place];  Service: Vascular;  Laterality: N/A;  Home Medications    Prior to Admission medications   Medication Sig Start Date End Date Taking? Authorizing Provider  acetaminophen (TYLENOL) 325 MG tablet Take 650 mg by mouth every 6 (six) hours as needed for mild pain. Do not exceed 4 gms of tylenol in 24 hours    [provider]  atorvastatin (LIPITOR) 40 MG tablet Take 40 mg by mouth daily.    [provider]  B Complex-C-Folic Acid (DIALYVITE PO) Take 1 tablet by mouth every Monday, Wednesday, and Friday.    [provider]  calcium acetate (PHOSLO) 667 MG capsule Take 667-2,001 mg by mouth See admin instructions. Take 3 capsules (2001mg ) three times daily with a meal and 1 capsule (667mg ) with a snack. 11/04/17   [provider]  carvedilol (COREG) 12.5 MG tablet Take 1 tablet (12.5 mg total) by mouth See admin instructions. Take 1 tablet (12.5 mg) by mouth twice daily on Sunday, Tuesday, Thursday, Saturday (hold on dialysis days) 04/10/17   [place]era, Carlos, MD  cinacalcet  (SENSIPAR) 30 MG tablet Take 30 mg by mouth daily with breakfast.     [provider]  clopidogrel (PLAVIX) 75 MG tablet  03/11/18   [provider]  colchicine 0.6 MG tablet Take by mouth. 05/19/11   [provider]  diclofenac sodium (VOLTAREN) 1 % GEL Apply 2 g topically 4 (four) times daily as needed (pain).  11/24/17   [provider]  EPINEPHrine (EPIPEN 2-PAK) 0.3 mg/0.3 mL IJ SOAJ injection Inject 0.3 mg into the muscle once as needed (anaphylaxis).    [provider]  HYDROcodone-acetaminophen (NORCO) 5-325 MG tablet Take 1 tablet by mouth every 6 (six) hours as needed for moderate pain. 07/14/18   Dew, Jason S, MD  LANTUS SOLOSTAR 100 UNIT/ML Solostar Pen Inject 15 Units into the skin at bedtime. 03/02/17   Short, Mackenzie, MD  omeprazole (PRILOSEC) 40 MG capsule Take 40 mg by mouth daily.    [provider]  ramipril (ALTACE) 10 MG capsule Take 10 mg by mouth 2 (two) times daily. 10/07/17   [provider]  traMADol (ULTRAM) 50 MG tablet Take by mouth. 05/19/11   [provider]    Family History Family History  Problem Relation Age of Onset  . Heart attack Brother        67  . Heart disease Brother        before age 60  . Hypertension Brother   . Heart attack Brother        62  . Heart attack Brother        65   . Diabetes Mother   . Hypertension Mother   . Heart disease Mother   . Heart disease Father   . Hypertension Father   . Other Father        amputation  . Hypertension Sister   . Heart disease Sister   . Vision loss Maternal Uncle     Social History Social History   Tobacco Use  . Smoking status: Never Smoker  . Smokeless tobacco: Never Used  Substance Use Topics  . Alcohol use: Yes    Comment: 1- fifth of gin a week  . Drug use: No     Allergies   Bee venom; Iodine; Penicillins; Plavix [clopidogrel]; and Sulfadiazine   Review of Systems Review of Systems  Constitutional:  Negative for appetite change and fatigue.  HENT: Negative for congestion, ear discharge and sinus pressure.   Eyes: Negative for  discharge.  Respiratory: Negative for cough.   Cardiovascular: Negative for chest pain.  Gastrointestinal: Negative for abdominal pain and diarrhea.  Genitourinary: Negative for frequency and hematuria.  Musculoskeletal: Negative for back pain.       Swelling to left arm  Skin: Negative for rash.  Neurological: Negative for seizures and headaches.  Psychiatric/Behavioral: Negative for hallucinations.     Physical Exam Updated Vital Signs BP (!) 119/58   Pulse 71   Temp 97.6 F (36.4 C) (Oral)   Resp 20   SpO2 95%   Physical Exam Vitals signs and nursing note reviewed.  Constitutional:      Appearance: He is well-developed.  HENT:     Head: Normocephalic.  Eyes:     General: No scleral icterus.    Conjunctiva/sclera: Conjunctivae normal.  Neck:     Musculoskeletal: Neck supple.     Thyroid: No thyromegaly.  Cardiovascular:     Rate and Rhythm: Normal rate and regular rhythm.     Heart sounds: No murmur. No friction rub. No gallop.   Pulmonary:     Breath sounds: No stridor. No wheezing or rales.  Chest:     Chest wall: No tenderness.  Abdominal:     General: There is no distension.     Tenderness: There is no abdominal tenderness. There is no rebound.  Musculoskeletal:     Comments: Swelling to left arm where fistula is but no tenderness  Lymphadenopathy:     Cervical: No cervical adenopathy.  Skin:    Findings: No erythema or rash.  Neurological:     Mental Status: He is alert and oriented to person, place, and time.     Motor: No abnormal muscle tone.     Coordination: Coordination normal.  Psychiatric:        Behavior: Behavior normal.      ED Treatments / Results  Labs (all labs ordered are listed, but only abnormal results are displayed) Labs Reviewed  CBC WITH DIFFERENTIAL/PLATELET - Abnormal; Notable for the following  components:      Result Value   Eosinophils Absolute 1.0 (*)    All other components within normal limits  BASIC METABOLIC PANEL - Abnormal; Notable for the following components:   Sodium 134 (*)    Chloride 95 (*)    CO2 21 (*)    Glucose, Bld 254 (*)    BUN 77 (*)    Creatinine, Ser 14.98 (*)    Calcium 8.0 (*)    GFR calc non Af Amer 3 (*)    GFR calc Af Amer 3 (*)    Anion gap 18 (*)    All other components within normal limits    EKG None  Radiology No results found.  Procedures Procedures (including critical care time)  Medications Ordered in ED Medications - No data to display   Initial Impression / Assessment and Plan / ED Course  I have reviewed the triage vital signs and the nursing notes.  Pertinent labs & imaging results that were available during my care of the patient were reviewed by me and considered in my medical decision making (see chart for details).        She has a fistula not working on the left arm.  Patient not short of breath chemistries are normal.  Patient had a procedure to his left arm a few days ago in [place].  He does not want to return to [place].  I have spoke with the hospitalist and  the hospitalist will admit the patient to [facility].  Dr. Oneida Alar will see the patient in consult with vascular surgery  Final Clinical Impressions(s) / ED Diagnoses   Final diagnoses:  None    ED Discharge Orders    None       Milton Ferguson, MD 10/24/18 1255

## 2018-10-24 NOTE — Telephone Encounter (Signed)
I spoke with Schnier and he advise for the patient to go to the ER and I did speak with Cyril Mourning at [facility] and informed her with medical advise

## 2018-10-24 NOTE — ED Triage Notes (Addendum)
Patient states he went to dialysis and they were unable to use fistula in left arm due to swelling and discoloration around area. States "I'm here because they said they need to put in a temporary one in my chest."

## 2018-10-25 ENCOUNTER — Other Ambulatory Visit: Payer: Self-pay

## 2018-10-25 ENCOUNTER — Inpatient Hospital Stay (HOSPITAL_COMMUNITY): Payer: Medicare Other | Admitting: Anesthesiology

## 2018-10-25 ENCOUNTER — Encounter (HOSPITAL_COMMUNITY): Payer: Self-pay | Admitting: Anesthesiology

## 2018-10-25 ENCOUNTER — Inpatient Hospital Stay (HOSPITAL_COMMUNITY): Payer: Medicare Other

## 2018-10-25 ENCOUNTER — Encounter (HOSPITAL_COMMUNITY)
Admission: EM | Disposition: A | Discharge status: Home / Self Care | Payer: Self-pay | Source: Home / Self Care | Attending: Internal Medicine

## 2018-10-25 HISTORY — PX: AV FISTULA PLACEMENT: SHX1204

## 2018-10-25 HISTORY — PX: INSERTION OF DIALYSIS CATHETER: SHX1324

## 2018-10-25 LAB — RENAL FUNCTION PANEL
Albumin: 3.1 g/dL — ABNORMAL LOW (ref 3.5–5.0)
Anion gap: 19 — ABNORMAL HIGH (ref 5–15)
BUN: 84 mg/dL — ABNORMAL HIGH (ref 8–23)
CO2: 18 mmol/L — ABNORMAL LOW (ref 22–32)
Calcium: 8.1 mg/dL — ABNORMAL LOW (ref 8.9–10.3)
Chloride: 97 mmol/L — ABNORMAL LOW (ref 98–111)
Creatinine, Ser: 16.52 mg/dL — ABNORMAL HIGH (ref 0.61–1.24)
GFR calc Af Amer: 3 mL/min — ABNORMAL LOW (ref >60)
GFR calc non Af Amer: 3 mL/min — ABNORMAL LOW (ref >60)
Glucose, Bld: 226 mg/dL — ABNORMAL HIGH (ref 70–99)
Phosphorus: 6.8 mg/dL — ABNORMAL HIGH (ref 2.5–4.6)
Potassium: 4.1 mmol/L (ref 3.5–5.1)
Sodium: 134 mmol/L — ABNORMAL LOW (ref 135–145)

## 2018-10-25 LAB — CBC
HCT: 47.6 % (ref 39.0–52.0)
Hemoglobin: 14.8 g/dL (ref 13.0–17.0)
MCH: 29.7 pg (ref 26.0–34.0)
MCHC: 31.1 g/dL (ref 30.0–36.0)
MCV: 95.4 fL (ref 80.0–100.0)
PLATELETS: 191 10*3/uL (ref 150–400)
RBC: 4.99 MIL/uL (ref 4.22–5.81)
RDW: 13.9 % (ref 11.5–15.5)
WBC: 5.5 10*3/uL (ref 4.0–10.5)
nRBC: 0 % (ref 0.0–0.2)

## 2018-10-25 LAB — GLUCOSE, CAPILLARY
GLUCOSE-CAPILLARY: 230 mg/dL — AB (ref 70–99)
Glucose-Capillary: 190 mg/dL — ABNORMAL HIGH (ref 70–99)
Glucose-Capillary: 221 mg/dL — ABNORMAL HIGH (ref 70–99)
Glucose-Capillary: 274 mg/dL — ABNORMAL HIGH (ref 70–99)

## 2018-10-25 LAB — MAGNESIUM: Magnesium: 2.4 mg/dL (ref 1.7–2.4)

## 2018-10-25 SURGERY — INSERTION OF DIALYSIS CATHETER
Anesthesia: General | Site: Groin | Laterality: Right

## 2018-10-25 MED ORDER — ALBUTEROL SULFATE HFA 108 (90 BASE) MCG/ACT IN AERS
INHALATION_SPRAY | RESPIRATORY_TRACT | Status: AC
  Filled 2018-10-25: qty 6.7

## 2018-10-25 MED ORDER — PROTAMINE SULFATE 10 MG/ML IV SOLN
INTRAVENOUS | Status: DC | PRN
  Administered 2018-10-25 (×5): 5 mg via INTRAVENOUS

## 2018-10-25 MED ORDER — ALTEPLASE 2 MG IJ SOLR
2.0000 mg | Freq: Once | INTRAMUSCULAR | Status: DC | PRN
  Administered 2018-10-26: 4 mg

## 2018-10-25 MED ORDER — LIDOCAINE 2% (20 MG/ML) 5 ML SYRINGE
INTRAMUSCULAR | Status: DC | PRN
  Administered 2018-10-25: 100 mg via INTRAVENOUS

## 2018-10-25 MED ORDER — MEPERIDINE HCL 50 MG/ML IJ SOLN: 6.2500 mg | INTRAMUSCULAR | Status: DC | PRN

## 2018-10-25 MED ORDER — SODIUM CHLORIDE 0.9 % IV SOLN
INTRAVENOUS | Status: DC | PRN
  Administered 2018-10-25: 50 ug/min via INTRAVENOUS
  Administered 2018-10-25: 65 ug/min via INTRAVENOUS

## 2018-10-25 MED ORDER — SODIUM CHLORIDE 0.9 % IV SOLN: 100.0000 mL | INTRAVENOUS | Status: DC | PRN

## 2018-10-25 MED ORDER — SODIUM CHLORIDE 0.9 % IV SOLN
INTRAVENOUS | Status: AC
  Filled 2018-10-25: qty 1.2

## 2018-10-25 MED ORDER — DEXAMETHASONE SODIUM PHOSPHATE 10 MG/ML IJ SOLN
INTRAMUSCULAR | Status: DC | PRN
  Administered 2018-10-25: 10 mg via INTRAVENOUS

## 2018-10-25 MED ORDER — ROCURONIUM BROMIDE 50 MG/5ML IV SOSY
PREFILLED_SYRINGE | INTRAVENOUS | Status: AC
  Filled 2018-10-25: qty 5

## 2018-10-25 MED ORDER — LIDOCAINE-EPINEPHRINE (PF) 1 %-1:200000 IJ SOLN
INTRAMUSCULAR | Status: AC
  Filled 2018-10-25: qty 30

## 2018-10-25 MED ORDER — CISATRACURIUM BESYLATE 20 MG/10ML IV SOLN
INTRAVENOUS | Status: AC
  Filled 2018-10-25: qty 10

## 2018-10-25 MED ORDER — HEPARIN SODIUM (PORCINE) 1000 UNIT/ML IJ SOLN
INTRAMUSCULAR | Status: AC
  Filled 2018-10-25: qty 1

## 2018-10-25 MED ORDER — SUGAMMADEX SODIUM 200 MG/2ML IV SOLN
INTRAVENOUS | Status: DC | PRN
  Administered 2018-10-25 (×5): 50 mg via INTRAVENOUS

## 2018-10-25 MED ORDER — DEXAMETHASONE SODIUM PHOSPHATE 10 MG/ML IJ SOLN
INTRAMUSCULAR | Status: AC
  Filled 2018-10-25: qty 1

## 2018-10-25 MED ORDER — HEPARIN SODIUM (PORCINE) 1000 UNIT/ML IJ SOLN
INTRAMUSCULAR | Status: DC | PRN
  Administered 2018-10-25: 6000 [IU] via INTRAVENOUS

## 2018-10-25 MED ORDER — VANCOMYCIN HCL 10 G IV SOLR
1500.0000 mg | INTRAVENOUS | Status: AC
  Administered 2018-10-25: 1500 mg via INTRAVENOUS
  Filled 2018-10-25: qty 1500

## 2018-10-25 MED ORDER — LIDOCAINE HCL (PF) 1 % IJ SOLN: 5.0000 mL | INTRAMUSCULAR | Status: DC | PRN

## 2018-10-25 MED ORDER — HEPARIN SODIUM (PORCINE) 1000 UNIT/ML DIALYSIS
1000.0000 [IU] | INTRAMUSCULAR | Status: DC | PRN
  Administered 2018-10-26: 6000 [IU] via INTRAVENOUS_CENTRAL

## 2018-10-25 MED ORDER — ALBUTEROL SULFATE HFA 108 (90 BASE) MCG/ACT IN AERS
INHALATION_SPRAY | RESPIRATORY_TRACT | Status: DC | PRN
  Administered 2018-10-25 (×2): 5 via RESPIRATORY_TRACT

## 2018-10-25 MED ORDER — SUGAMMADEX SODIUM 500 MG/5ML IV SOLN
INTRAVENOUS | Status: AC
  Filled 2018-10-25: qty 5

## 2018-10-25 MED ORDER — ONDANSETRON HCL 4 MG/2ML IJ SOLN
INTRAMUSCULAR | Status: DC | PRN
  Administered 2018-10-25: 4 mg via INTRAVENOUS

## 2018-10-25 MED ORDER — CHLORHEXIDINE GLUCONATE CLOTH 2 % EX PADS: 6.0000 | MEDICATED_PAD | Freq: Every day | CUTANEOUS | Status: DC

## 2018-10-25 MED ORDER — PHENYLEPHRINE 40 MCG/ML (10ML) SYRINGE FOR IV PUSH (FOR BLOOD PRESSURE SUPPORT)
PREFILLED_SYRINGE | INTRAVENOUS | Status: DC | PRN
  Administered 2018-10-25: 120 ug via INTRAVENOUS
  Administered 2018-10-25: 40 ug via INTRAVENOUS
  Administered 2018-10-25 (×2): 120 ug via INTRAVENOUS

## 2018-10-25 MED ORDER — SODIUM CHLORIDE 0.9 % IV SOLN
INTRAVENOUS | Status: DC
  Administered 2018-10-25: 08:00:00 via INTRAVENOUS

## 2018-10-25 MED ORDER — PROPOFOL 10 MG/ML IV BOLUS
INTRAVENOUS | Status: AC
  Filled 2018-10-25: qty 20

## 2018-10-25 MED ORDER — LIDOCAINE 2% (20 MG/ML) 5 ML SYRINGE
INTRAMUSCULAR | Status: AC
  Filled 2018-10-25: qty 5

## 2018-10-25 MED ORDER — PROPOFOL 10 MG/ML IV BOLUS
INTRAVENOUS | Status: DC | PRN
  Administered 2018-10-25: 50 mg via INTRAVENOUS
  Administered 2018-10-25: 150 mg via INTRAVENOUS

## 2018-10-25 MED ORDER — HEPARIN SODIUM (PORCINE) 1000 UNIT/ML IJ SOLN
INTRAMUSCULAR | Status: DC | PRN
  Administered 2018-10-25: 10000 [IU] via INTRAVENOUS

## 2018-10-25 MED ORDER — FENTANYL CITRATE (PF) 100 MCG/2ML IJ SOLN: 25.0000 ug | INTRAMUSCULAR | Status: DC | PRN

## 2018-10-25 MED ORDER — LIDOCAINE HCL (PF) 1 % IJ SOLN
INTRAMUSCULAR | Status: AC
  Filled 2018-10-25: qty 30

## 2018-10-25 MED ORDER — PHENYLEPHRINE 40 MCG/ML (10ML) SYRINGE FOR IV PUSH (FOR BLOOD PRESSURE SUPPORT)
PREFILLED_SYRINGE | INTRAVENOUS | Status: AC
  Filled 2018-10-25: qty 10

## 2018-10-25 MED ORDER — HEPARIN SODIUM (PORCINE) 1000 UNIT/ML IJ SOLN
INTRAMUSCULAR | Status: AC
  Administered 2018-10-25: 1000 [IU]
  Filled 2018-10-25: qty 4

## 2018-10-25 MED ORDER — VASOPRESSIN 20 UNIT/ML IV SOLN
INTRAVENOUS | Status: DC | PRN
  Administered 2018-10-25 (×6): 1 [IU] via INTRAVENOUS

## 2018-10-25 MED ORDER — 0.9 % SODIUM CHLORIDE (POUR BTL) OPTIME
TOPICAL | Status: DC | PRN
  Administered 2018-10-25: 1000 mL

## 2018-10-25 MED ORDER — VASOPRESSIN 20 UNIT/ML IV SOLN
INTRAVENOUS | Status: AC
  Filled 2018-10-25: qty 1

## 2018-10-25 MED ORDER — FENTANYL CITRATE (PF) 250 MCG/5ML IJ SOLN
INTRAMUSCULAR | Status: AC
  Filled 2018-10-25: qty 5

## 2018-10-25 MED ORDER — PENTAFLUOROPROP-TETRAFLUOROETH EX AERO: 1.0000 "application " | INHALATION_SPRAY | CUTANEOUS | Status: DC | PRN

## 2018-10-25 MED ORDER — FENTANYL CITRATE (PF) 100 MCG/2ML IJ SOLN
INTRAMUSCULAR | Status: DC | PRN
  Administered 2018-10-25 (×2): 50 ug via INTRAVENOUS

## 2018-10-25 MED ORDER — MIDAZOLAM HCL 2 MG/2ML IJ SOLN
INTRAMUSCULAR | Status: AC
  Filled 2018-10-25: qty 2

## 2018-10-25 MED ORDER — ONDANSETRON HCL 4 MG/2ML IJ SOLN
INTRAMUSCULAR | Status: AC
  Filled 2018-10-25: qty 2

## 2018-10-25 MED ORDER — ONDANSETRON HCL 4 MG/2ML IJ SOLN: 4.0000 mg | Freq: Once | INTRAMUSCULAR | Status: DC | PRN

## 2018-10-25 MED ORDER — CHLORHEXIDINE GLUCONATE CLOTH 2 % EX PADS
6.0000 | MEDICATED_PAD | Freq: Every day | CUTANEOUS | Status: DC
  Administered 2018-10-26: 6 via TOPICAL

## 2018-10-25 MED ORDER — ALBUMIN HUMAN 5 % IV SOLN
INTRAVENOUS | Status: DC | PRN
  Administered 2018-10-25: 09:00:00 via INTRAVENOUS

## 2018-10-25 MED ORDER — SODIUM CHLORIDE 0.9 % IV SOLN
INTRAVENOUS | Status: DC | PRN
  Administered 2018-10-25: 10:00:00

## 2018-10-25 MED ORDER — ROCURONIUM BROMIDE 10 MG/ML (PF) SYRINGE
PREFILLED_SYRINGE | INTRAVENOUS | Status: DC | PRN
  Administered 2018-10-25: 50 mg via INTRAVENOUS

## 2018-10-25 MED ORDER — MIDAZOLAM HCL 5 MG/5ML IJ SOLN
INTRAMUSCULAR | Status: DC | PRN
  Administered 2018-10-25: 2 mg via INTRAVENOUS

## 2018-10-25 MED ORDER — LIDOCAINE-PRILOCAINE 2.5-2.5 % EX CREA: 1.0000 "application " | TOPICAL_CREAM | CUTANEOUS | Status: DC | PRN

## 2018-10-25 MED ORDER — EPHEDRINE SULFATE-NACL 50-0.9 MG/10ML-% IV SOSY
PREFILLED_SYRINGE | INTRAVENOUS | Status: DC | PRN
  Administered 2018-10-25: 10 mg via INTRAVENOUS

## 2018-10-25 SURGICAL SUPPLY — 82 items
ADH SKN CLS APL DERMABOND .7 (GAUZE/BANDAGES/DRESSINGS) ×2
ADH SKN CLS LQ APL DERMABOND (GAUZE/BANDAGES/DRESSINGS) ×4
BAG DECANTER FOR FLEXI CONT (MISCELLANEOUS) ×4 IMPLANT
BIOPATCH RED 1 DISK 7.0 (GAUZE/BANDAGES/DRESSINGS) ×3 IMPLANT
BIOPATCH RED 1IN DISK 7.0MM (GAUZE/BANDAGES/DRESSINGS) ×1
CANISTER SUCT 3000ML PPV (MISCELLANEOUS) ×4 IMPLANT
CATH BEACON 5 .035 65 KMP TIP (CATHETERS) ×2 IMPLANT
CATH PALINDROME RT-P 15FX19CM (CATHETERS) IMPLANT
CATH PALINDROME RT-P 15FX23CM (CATHETERS) IMPLANT
CATH PALINDROME RT-P 15FX28CM (CATHETERS) IMPLANT
CATH PALINDROME RT-P 15FX55CM (CATHETERS) ×2 IMPLANT
CLIP VESOCCLUDE MED 6/CT (CLIP) ×4 IMPLANT
CLIP VESOCCLUDE SM WIDE 6/CT (CLIP) ×4 IMPLANT
COVER PROBE W GEL 5X96 (DRAPES) ×6 IMPLANT
COVER SURGICAL LIGHT HANDLE (MISCELLANEOUS) ×4 IMPLANT
COVER WAND RF STERILE (DRAPES) ×4 IMPLANT
DERMABOND ADHESIVE PROPEN (GAUZE/BANDAGES/DRESSINGS) ×4
DERMABOND ADVANCED (GAUZE/BANDAGES/DRESSINGS) ×2
DERMABOND ADVANCED .7 DNX12 (GAUZE/BANDAGES/DRESSINGS) ×2 IMPLANT
DERMABOND ADVANCED .7 DNX6 (GAUZE/BANDAGES/DRESSINGS) IMPLANT
DEVICE TORQUE KENDALL .025-038 (MISCELLANEOUS) ×2 IMPLANT
DRAPE C-ARM 42X72 X-RAY (DRAPES) ×4 IMPLANT
DRAPE CHEST BREAST 15X10 FENES (DRAPES) ×4 IMPLANT
DRAPE INCISE 23X17 IOBAN STRL (DRAPES) ×2
DRAPE INCISE 23X17 STRL (DRAPES) IMPLANT
DRAPE INCISE IOBAN 23X17 STRL (DRAPES) ×2 IMPLANT
DRAPE INCISE IOBAN 66X45 STRL (DRAPES) ×4 IMPLANT
DRAPE ORTHO SPLIT 77X108 STRL (DRAPES) ×4
DRAPE SURG ORHT 6 SPLT 77X108 (DRAPES) IMPLANT
ELECT REM PT RETURN 9FT ADLT (ELECTROSURGICAL) ×4
ELECTRODE REM PT RTRN 9FT ADLT (ELECTROSURGICAL) ×2 IMPLANT
GAUZE 4X4 16PLY RFD (DISPOSABLE) ×4 IMPLANT
GLOVE BIO SURGEON STRL SZ7 (GLOVE) ×2 IMPLANT
GLOVE BIO SURGEON STRL SZ7.5 (GLOVE) ×4 IMPLANT
GLOVE BIOGEL PI IND STRL 6.5 (GLOVE) IMPLANT
GLOVE BIOGEL PI IND STRL 7.0 (GLOVE) IMPLANT
GLOVE BIOGEL PI INDICATOR 6.5 (GLOVE) ×4
GLOVE BIOGEL PI INDICATOR 7.0 (GLOVE) ×4
GLOVE ECLIPSE 6.5 STRL STRAW (GLOVE) ×6 IMPLANT
GOWN STRL REUS W/ TWL LRG LVL3 (GOWN DISPOSABLE) ×4 IMPLANT
GOWN STRL REUS W/ TWL XL LVL3 (GOWN DISPOSABLE) ×2 IMPLANT
GOWN STRL REUS W/TWL LRG LVL3 (GOWN DISPOSABLE) ×16
GOWN STRL REUS W/TWL XL LVL3 (GOWN DISPOSABLE) ×4
GRAFT GORETEX STRT 4-7X45 (Vascular Products) ×2 IMPLANT
GUIDEWIRE ANGLED .035X150CM (WIRE) ×2 IMPLANT
HEMOSTAT SNOW SURGICEL 2X4 (HEMOSTASIS) IMPLANT
INSERT FOGARTY SM (MISCELLANEOUS) ×4 IMPLANT
KIT BASIN OR (CUSTOM PROCEDURE TRAY) ×4 IMPLANT
KIT TURNOVER KIT B (KITS) ×4 IMPLANT
NDL 18GX1X1/2 (RX/OR ONLY) (NEEDLE) ×2 IMPLANT
NDL HYPO 25GX1X1/2 BEV (NEEDLE) ×2 IMPLANT
NEEDLE 18GX1X1/2 (RX/OR ONLY) (NEEDLE) ×4 IMPLANT
NEEDLE HYPO 25GX1X1/2 BEV (NEEDLE) ×4 IMPLANT
NS IRRIG 1000ML POUR BTL (IV SOLUTION) ×4 IMPLANT
PACK CV ACCESS (CUSTOM PROCEDURE TRAY) ×4 IMPLANT
PACK SURGICAL SETUP 50X90 (CUSTOM PROCEDURE TRAY) ×4 IMPLANT
PAD ARMBOARD 7.5X6 YLW CONV (MISCELLANEOUS) ×8 IMPLANT
SET MICROPUNCTURE 5F STIFF (MISCELLANEOUS) ×2 IMPLANT
SHEATH PINNACLE 5F 10CM (SHEATH) ×2 IMPLANT
SOAP 2 % CHG 4 OZ (WOUND CARE) ×4 IMPLANT
SPONGE LAP 18X18 RF (DISPOSABLE) ×2 IMPLANT
SUT ETHILON 3 0 PS 1 (SUTURE) ×6 IMPLANT
SUT MNCRL AB 4-0 PS2 18 (SUTURE) ×4 IMPLANT
SUT MON AB 4-0 PC3 18 (SUTURE) ×4 IMPLANT
SUT PROLENE 5 0 C 1 24 (SUTURE) ×4 IMPLANT
SUT PROLENE 6 0 BV (SUTURE) ×10 IMPLANT
SUT VIC AB 2-0 CT1 27 (SUTURE) ×4
SUT VIC AB 2-0 CT1 TAPERPNT 27 (SUTURE) IMPLANT
SUT VIC AB 3-0 SH 27 (SUTURE) ×16
SUT VIC AB 3-0 SH 27X BRD (SUTURE) ×4 IMPLANT
SUT VIC AB 3-0 SH 27XBRD (SUTURE) IMPLANT
SYR 10ML LL (SYRINGE) ×4 IMPLANT
SYR 20CC LL (SYRINGE) ×8 IMPLANT
SYR 5ML LL (SYRINGE) ×4 IMPLANT
SYR CONTROL 10ML LL (SYRINGE) ×4 IMPLANT
TAPE CLOTH SURG 4X10 WHT LF (GAUZE/BANDAGES/DRESSINGS) ×2 IMPLANT
TOWEL GREEN STERILE (TOWEL DISPOSABLE) ×4 IMPLANT
TOWEL GREEN STERILE FF (TOWEL DISPOSABLE) ×4 IMPLANT
TOWEL OR 17X26 4PK STRL BLUE (TOWEL DISPOSABLE) ×4 IMPLANT
UNDERPAD 30X30 (UNDERPADS AND DIAPERS) ×4 IMPLANT
WATER STERILE IRR 1000ML POUR (IV SOLUTION) ×4 IMPLANT
WIRE TORQFLEX AUST .018X40CM (WIRE) ×4 IMPLANT

## 2018-10-25 NOTE — Anesthesia Procedure Notes (Signed)
Procedure Name: LMA Insertion Date/Time: 10/25/2018 9:01 AM Performed by: Jenne Campus, CRNA Pre-anesthesia Checklist: Patient identified, Emergency Drugs available, Suction available and Patient being monitored Patient Re-evaluated:Patient Re-evaluated prior to induction Oxygen Delivery Method: Circle System Utilized Preoxygenation: Pre-oxygenation with 100% oxygen Induction Type: IV induction Ventilation: Mask ventilation without difficulty LMA: LMA inserted LMA Size: 5.0 Number of attempts: 1 Airway Equipment and Method: Bite block Placement Confirmation: positive ETCO2 Tube secured with: Tape Dental Injury: Teeth and Oropharynx as per pre-operative assessment

## 2018-10-25 NOTE — Progress Notes (Signed)
  Progress Note    10/25/2018 8:40 AM Day of Surgery  Subjective: no overnight issues  Vitals:   10/24/18 2122 10/25/18 0607  BP: 122/64 (!) 142/75  Pulse: 68 71  Resp: 18 18  Temp: (!) 97.5 F (36.4 C) 97.8 F (36.6 C)  SpO2: 98% 100%    Physical Exam: aaox3 Non labored respirations Abdomen is soft Left arm without erythema or ttp Right groin skin in tact, palpable femoral pulse  CBC    Component Value Date/Time   WBC 5.5 10/25/2018 0426   RBC 4.99 10/25/2018 0426   HGB 14.8 10/25/2018 0426   HCT 47.6 10/25/2018 0426   PLT 191 10/25/2018 0426   MCV 95.4 10/25/2018 0426   MCH 29.7 10/25/2018 0426   MCHC 31.1 10/25/2018 0426   RDW 13.9 10/25/2018 0426   LYMPHSABS 0.7 10/24/2018 1123   MONOABS 0.9 10/24/2018 1123   EOSABS 1.0 (H) 10/24/2018 1123   BASOSABS 0.1 10/24/2018 1123    BMET    Component Value Date/Time   NA 134 (L) 10/25/2018 0419   K 4.1 10/25/2018 0419   CL 97 (L) 10/25/2018 0419   CO2 18 (L) 10/25/2018 0419   GLUCOSE 226 (H) 10/25/2018 0419   BUN 84 (H) 10/25/2018 0419   CREATININE 16.52 (H) 10/25/2018 0419   CALCIUM 8.1 (L) 10/25/2018 0419   GFRNONAA 3 (L) 10/25/2018 0419   GFRAA 3 (L) 10/25/2018 0419    INR    Component Value Date/Time   INR 1.13 07/12/2018 1351     Intake/Output Summary (Last 24 hours) at 10/25/2018 0840 Last data filed at 10/25/2018 0607 Gross per 24 hour  Intake 300 ml  Output 0 ml  Net 300 ml     Assessment:  68 y.o. male with esrd, failed previous access.  Plan: tdc today either neck or left groin Will plan right femoral av loop graft.    [name] C. Donzetta Matters, MD [facility] Office: [phone] Pager: [phone]  10/25/2018 8:40 AM

## 2018-10-25 NOTE — Anesthesia Preprocedure Evaluation (Addendum)
Anesthesia Evaluation  Patient identified by MRN, date of birth, ID band Patient awake    Reviewed: Allergy & Precautions, NPO status , Patient's Chart, lab work & pertinent test results  History of Anesthesia Complications Negative for: history of anesthetic complications  Airway Mallampati: III  TM Distance: >3 FB Neck ROM: Full    Dental  (+) Poor Dentition, Loose, Missing, Dental Advisory Given   Pulmonary neg pulmonary ROS, neg sleep apnea, neg COPD,    breath sounds clear to auscultation- rhonchi (-) wheezing      Cardiovascular hypertension, Pt. on medications + Peripheral Vascular Disease  (-) CAD, (-) Past MI, (-) Cardiac Stents and (-) CABG  Rhythm:Regular Rate:Normal - Systolic murmurs and - Diastolic murmurs    Neuro/Psych PSYCHIATRIC DISORDERS Anxiety Depression CVA, No Residual Symptoms    GI/Hepatic Neg liver ROS, GERD  ,  Endo/Other  diabetes, Insulin DependentHypothyroidism   Renal/GU ESRF and DialysisRenal disease     Musculoskeletal  (+) Arthritis ,   Abdominal (+) + obese,   Peds  Hematology  (+) anemia ,   Anesthesia Other Findings Past Medical History: No date: Anemia in chronic kidney disease(285.21) No date: Anxiety No date: Arthritis No date: Depression No date: ESRD (end stage renal disease) on dialysis [facility])     Comment:  "MWF; [facility] [place]" (02/18/2017) No date: Essential hypertension No date: GERD (gastroesophageal reflux disease) No date: Glaucoma No date: History of blood transfusion 2016: History of stroke No date: Insomnia No date: Nonischemic cardiomyopathy ([place]) No date: Stroke [facility])     Comment:  mini stroke No date: Type 2 diabetes mellitus ([place])   Reproductive/Obstetrics                            Anesthesia Physical  Anesthesia Plan  ASA: IV  Anesthesia Plan: General   Post-op Pain Management:    Induction: Intravenous  PONV Risk  Score and Plan: 1 and Ondansetron and Treatment may vary due to age or medical condition  Airway Management Planned: LMA  Additional Equipment: None  Intra-op Plan:   Post-operative Plan: Extubation in OR  Informed Consent: I have reviewed the patients History and Physical, chart, labs and discussed the procedure including the risks, benefits and alternatives for the proposed anesthesia with the patient or authorized representative who has indicated his/her understanding and acceptance.     Dental advisory given  Plan Discussed with: CRNA and Anesthesiologist  Anesthesia Plan Comments:         Anesthesia Quick Evaluation

## 2018-10-25 NOTE — Op Note (Signed)
Patient name: Jacob [name] MRN: [id number] DOB: 1951-03-24 Sex: male  10/25/2018 Pre-operative Diagnosis: End-stage renal disease Post-operative diagnosis:  Same Surgeon:  Erlene Quan C. Donzetta Matters, MD Procedure Performed: 1.  Ultrasound-guided cannulation bilateral internal jugular veins 2.  Placement of 55 cm palindrome catheter and left common femoral vein with ultrasound guidance 3.  Right SFA to common femoral vein 4-7 mm loop AV graft  Indications: 68 year old male with history end-stage renal disease.  He has previously failed bilateral upper extremity access he is now indicated for catheter and will place permanent access during the same procedure.  Findings: Bilateral internal jugular veins were cannulated although I could not get any wires to pass including using a Glidewire and 5 French sheath from the left.  The left common femoral vein was patent and compressible 55 cm catheter was placed terminating in the distal IVC.  The right common femoral vein was patent as well the SFA had significant disease at completion there was a strong thrill in the AV graft.   Procedure:  The patient was identified in the holding area and taken to the operating room where is put supine operative when general anesthesia induced.  He sterilely prepped draped initially the bilateral neck and chest area given antibiotics and timeout was called.  We began using ultrasound to evaluate the right internal jugular vein this was cannulated first with 18-gauge needle.  I cannot get a wire past.  I cannulated with micropuncture needle could not get a wire past even using fluoroscopy.  I turned my attention to the left IJ which had apparent chronic thrombus in it.  I cannulated with micropuncture needle was able to get a wire to pass initially down to the subclavian vein stent.  I then placed a micropuncture sheath was able to get a Glidewire to track across the left innominate vein.  I then placed a 5 French sheath under  fluoroscopic guidance.  I used a bare catheter and Glidewire but could not get any axis down the SVC.  Given the patient has iodine allergy I did not use any contrast at this time.  Both sheaths were removed pressure held.  We then sterilely prepped and draped the bilateral groin areas.  I used ultrasound to evaluate first the common femoral vein on the left which was noted to be patent and compressible.  I cannulated this with 18-gauge needle placed a wire.  I serially dilated the tract and placed in introducer sheath.  A separate stab incision was made in the 50s 5 cm catheter was tunneled.  I then placed this through the introducer sheath under fluoroscopic guidance terminating the terminal IVC.  The catheter was then assembled flushed with heparinized saline and then locked with concentrated heparin 3 cc in either port.  It was affixed the skin with 3-0 nylon suture and the groin incision was closed with 4-0 Monocryl Dermabond was placed to both sides of sterile dressing was placed.  We then turned our attention to the right groin.  I used ultrasound to identify the bifurcation of the common femoral artery as well as the common femoral vein which was noted be patent and compressible.  A transverse incision was made.  There was significant dense scarring in the groin likely from previous catheterization.  I dissected down first identified the artery.  I placed a vessel loop around the SFA.  I dissected medial identified the saphenofemoral junction and the femoral vein.  I made a counterincision and tunneled 4-7  mm loop graft with the venous and medially.  I then placed a side-biting clamp on the vein after the patient was fully heparinized.  I opened the vein longitudinally.  I trimmed the graft to size and sewed end-to-side with 5-0 Prolene suture.  After completion I released the clamp had backbleeding through my graft flushed with heparinized and clamped the graft in the counterincision.  I then clamped my  SFA distally proximally opened longitudinally.  I did take a piece out the artery.  I did have to clean up the edges as there was significant disease there.  After the graft was trimmed to size with minimal spatulation I then sewed it end-to-side with 6-0 Prolene suture.  This time prior to completion anastomosis allowed flushing all directions.  I then flushed through my graft.  I confirmed with Doppler flow above and below in the SFA and strong outflow in the common femoral vein that augmented with compression of the graft itself.  I could palpate a thrill in the graft.  Satisfied with this we obtained hemostasis and administer 25 mg of protamine.  The wound was irrigated.  I closed all wounds with Vicryl Monocryl and Dermabond placed to level skin.  He was then awakened anesthesia having tolerated procedure without immediate complication.  All counts were correct at completion.  EBL: 200 cc    Goble Fudala C. Donzetta Matters, MD [facility] Office: [phone] Pager: [phone]

## 2018-10-25 NOTE — Procedures (Signed)
I was present at this dialysis session. I have reviewed the session itself and made appropriate changes.   Vital signs in last 24 hours:  Temp:  [97 F (36.1 C)-97.8 F (36.6 C)] 97.6 F (36.4 C) (03/24 1310) Pulse Rate:  [68-89] 89 (03/24 1430) Resp:  [15-22] 21 (03/24 1310) BP: (85-148)/(38-82) 85/38 (03/24 1430) SpO2:  [95 %-100 %] 100 % (03/24 1310) Weight:  [125.5 kg] 125.5 kg (03/24 1310) Weight change:  Filed Weights   10/25/18 1310  Weight: 125.5 kg    Recent Labs  Lab 10/25/18 0419  NA 134*  K 4.1  CL 97*  CO2 18*  GLUCOSE 226*  BUN 84*  CREATININE 16.52*  CALCIUM 8.1*  PHOS 6.8*    Recent Labs  Lab 10/24/18 1123 10/24/18 1424 10/25/18 0426  WBC 5.6 5.6 5.5  NEUTROABS 3.1  --   --   HGB 15.0 15.5 14.8  HCT 48.8 50.2 47.6  MCV 98.4 96.2 95.4  PLT 210 190 191    Scheduled Meds: . atorvastatin  40 mg Oral q1800  . calcium acetate  2,001 mg Oral TID WC  . [START ON 10/26/2018] Chlorhexidine Gluconate Cloth  6 each Topical Q0600  . cinacalcet  30 mg Oral Q breakfast  . heparin  5,000 Units Subcutaneous Q8H  . insulin aspart  0-9 Units Subcutaneous TID WC  . insulin glargine  12 Units Subcutaneous QHS  . pantoprazole  40 mg Oral Daily   Continuous Infusions: . sodium chloride 10 mL/hr at 10/25/18 0817   PRN Meds:.acetaminophen **OR** acetaminophen, calcium acetate, HYDROcodone-acetaminophen, ondansetron **OR** ondansetron (ZOFRAN) IV   Jacob Potts,  MD 10/25/2018, 3:07 PM

## 2018-10-25 NOTE — Consult Note (Addendum)
[facility] Renal Consultation Note    Indication for Consultation:  Management of ESRD/hemodialysis; anemia, hypertension/volume and secondary hyperparathyroidism  HPI: Jacob [name] is a 68 y.o. male with ESRD (MWF HD)  on dialysis in [place] who was admitted yesterday due to inability to use AVF at his outpatient HD unit.  He had a [facility] poorly functioning left AVF with arm swelling with evaluation by Dr. 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  He hs had multiple access failures in this right arm.  He was admitted to [facility] vs returning to [facility] due to his preference.  He was evaluated by [facility]VS and was taken to the OR this am for left femoral TDC placement and right femoral loop graft. VVS was unable to place a central TDC.  Pertinent labs today: K 4.1 BUN 84 Cr 16.5 hgb 13.8 Ca 8.1 P 6.8.  He is seen in the PACU, groggy and wanting to sit up more due to discomfort in his back  Past Medical History:  Diagnosis Date  . Anemia in chronic kidney disease(285.21)   . Anxiety   . Arthritis   . Depression   . ESRD (end stage renal disease) on dialysis [facility])    "MWF; [facility]" (02/18/2017)  . Essential hypertension   . GERD (gastroesophageal reflux disease)   . Glaucoma   . History of blood transfusion   . History of stroke 2016  . Insomnia   . Nonischemic cardiomyopathy ([place])   . Stroke [facility])    mini stroke  . Type 2 diabetes mellitus ([place])    Past Surgical History:  Procedure Laterality Date  . A/V FISTULAGRAM Right 10/06/2016   Procedure: A/V Fistulagram;  Surgeon: [name], MD;  Location: [place] CV LAB;  Service: Cardiovascular;  Laterality: Right;  . A/V FISTULAGRAM Right 04/27/2017   Procedure: A/V Fistulagram;  Surgeon: [name], MD;  Location: [place] CV LAB;  Service: Cardiovascular;  Laterality: Right;  . A/V FISTULAGRAM Left 10/20/2018    Procedure: A/V FISTULAGRAM;  Surgeon: Algernon Huxley, MD;  Location: [place] CV LAB;  Service: Cardiovascular;  Laterality: Left;  . A/V SHUNTOGRAM Right 11/17/2017   Procedure: A/V SHUNTOGRAM;  Surgeon: Waynetta Sandy, MD;  Location: [place] CV LAB;  Service: Cardiovascular;  Laterality: Right;  . ABDOMINAL AORTOGRAM N/A 12/30/2016   Procedure: Abdominal Aortogram;  Surgeon: Waynetta Sandy, MD;  Location: [place] CV LAB;  Service: Cardiovascular;  Laterality: N/A;  . ABDOMINAL AORTOGRAM N/A 04/08/2017   Procedure: ABDOMINAL AORTOGRAM;  Surgeon: Conrad Bellerose, MD;  Location: [place] CV LAB;  Service: Cardiovascular;  Laterality: N/A;  . ABDOMINAL AORTOGRAM W/LOWER EXTREMITY N/A 02/23/2017   Procedure: Abdominal Aortogram w/Lower Extremity;  Surgeon: [name], MD;  Location: [place] CV LAB;  Service: Cardiovascular;  Laterality: N/A;  Rt. leg  . AMPUTATION Right 02/28/2017   Procedure: RIGHT BELOW KNEE AMPUTATION;  Surgeon: [name], MD;  Location: [place];  Service: Vascular;  Laterality: Right;  . AV FISTULA PLACEMENT     Hx: of  . AV FISTULA PLACEMENT Right 05/04/2013   Procedure: ARTERIOVENOUS (AV) FISTULA CREATION- RIGHT ARM;  Surgeon: Conrad Riverside, MD;  Location: [place];  Service: Vascular;  Laterality: Right;  Ultrasound guided  . AV FISTULA PLACEMENT Right 07/28/2016   Procedure: BRACHIOCEPHALIC ARTERIOVENOUS (AV) FISTULA CREATION;  Surgeon: [name],  MD;  Location: [place];  Service: Vascular;  Laterality: Right;  . AV FISTULA PLACEMENT Left 06/16/2018   Procedure: ARTERIOVENOUS (AV) FISTULA CREATION ( BRACHIALCEPHALIC );  Surgeon: Algernon Huxley, MD;  Location: [facility];  Service: Vascular;  Laterality: Left;  . BACK SURGERY    . BELOW KNEE LEG AMPUTATION     1 PRIOR AMPUTATION ON FOOT  RT LEG/ FOOT  . CAPD INSERTION N/A 03/11/2018   Procedure: LAPAROSCOPIC INSERTION OF PERIOTONEAL DIALYSIS CATHETER;  Surgeon: Clovis Riley, MD;   Location: [place];  Service: General;  Laterality: N/A;  . CATARACT EXTRACTION W/PHACO Left 07/16/2014   Procedure: CATARACT EXTRACTION PHACO AND INTRAOCULAR LENS PLACEMENT ([place]);  Surgeon: [name], MD;  Location: [facility];  Service: Ophthalmology;  Laterality: Left;  CDE:8.86  . CATARACT EXTRACTION W/PHACO Right 07/30/2014   Procedure: CATARACT EXTRACTION PHACO AND INTRAOCULAR LENS PLACEMENT (IOC);  Surgeon: [name], MD;  Location: [facility];  Service: Ophthalmology;  Laterality: Right;  CDE 8.99  . COLONOSCOPY     Hx: of  . EYE SURGERY     bilateral cataract  . FISTULA SUPERFICIALIZATION Right 07/28/2016   Procedure: FISTULA SUPERFICIALIZATION;  Surgeon: [name], MD;  Location: [place];  Service: Vascular;  Laterality: Right;  . FISTULA SUPERFICIALIZATION Right 10/13/2016   Procedure: BRACHIOCEPHALIC ARTERIOVENOUS FISTULA SUPERFICIALIZATION;  Surgeon: [name], MD;  Location: [place];  Service: Vascular;  Laterality: Right;  . FISTULOGRAM N/A 05/04/2013   Procedure: CENTRAL VENOGRAM;  Surgeon: Conrad [place], MD;  Location: [place];  Service: Vascular;  Laterality: N/A;  . INSERTION OF DIALYSIS CATHETER Right 05/04/2013   Procedure: INSERTION OF DIALYSIS CATHETER;  Surgeon: Conrad [place], MD;  Location: [place];  Service: Vascular;  Laterality: Right;  Ultrasound guided  . INSERTION OF DIALYSIS CATHETER N/A 07/28/2016   Procedure: INSERTION OF DIALYSIS CATHETER;  Surgeon: [name], MD;  Location: [place];  Service: Vascular;  Laterality: N/A;  . INSERTION OF DIALYSIS CATHETER Right 03/07/2018   Procedure: INSERTION OF DIALYSIS CATHETER;  Surgeon: Conrad [place], MD;  Location: [place];  Service: Vascular;  Laterality: Right;  . IR FLUORO GUIDE CV LINE RIGHT  04/13/2017  . IR FLUORO GUIDE CV LINE RIGHT  05/19/2018  . IR REMOVAL TUN CV CATH W/O FL  10/04/2018  . IR THROMBECTOMY AV FISTULA W/THROMBOLYSIS/PTA/STENT INC/SHUNT/IMG RT Right 09/07/2017  . IR US GUIDE VASC ACCESS RIGHT   04/13/2017  . IR US GUIDE VASC ACCESS RIGHT  09/07/2017  . LIGATION OF ARTERIOVENOUS  FISTULA Left 05/04/2013   Procedure: LIGATION OF LEFT RADIAL CEPHALIC ARTERIOVENOUS  FISTULA;  Surgeon: Conrad [place], MD;  Location: [place];  Service: Vascular;  Laterality: Left;  Ultrasound guided  . LIGATION OF ARTERIOVENOUS  FISTULA Right 07/28/2016   Procedure: LIGATION OF RADIOCEPHALIC ARTERIOVENOUS  FISTULA;  Surgeon: [name], MD;  Location: [place];  Service: Vascular;  Laterality: Right;  . LOWER EXTREMITY ANGIOGRAPHY N/A 12/30/2016   Procedure: Lower Extremity Angiography;  Surgeon: Waynetta Sandy, MD;  Location: [place] CV LAB;  Service: Cardiovascular;  Laterality: N/A;  . LUMBAR SPINE SURGERY    . PERIPHERAL VASCULAR ATHERECTOMY Right 12/30/2016   Procedure: Peripheral Vascular Atherectomy;  Surgeon: Waynetta Sandy, MD;  Location: [place] CV LAB;  Service: Cardiovascular;  Laterality: Right;  AT and PT  . PERIPHERAL VASCULAR BALLOON ANGIOPLASTY Right 12/30/2016   Procedure: Peripheral Vascular Balloon Angioplasty;  Surgeon: Waynetta Sandy, MD;  Location: [place] CV LAB;  Service: Cardiovascular;  Laterality: Right;  PTA of PT and AT  . PERIPHERAL VASCULAR BALLOON ANGIOPLASTY  02/23/2017   Procedure: Peripheral Vascular Balloon Angioplasty;  Surgeon: [name], MD;  Location: [place] CV LAB;  Service: Cardiovascular;;  RT. Anterior Tib.  [name] PERIPHERAL VASCULAR BALLOON ANGIOPLASTY  04/08/2017   Procedure: PERIPHERAL VASCULAR BALLOON ANGIOPLASTY;  Surgeon: Conrad [place], MD;  Location: [place] CV LAB;  Service: Cardiovascular;;  . PERIPHERAL VASCULAR CATHETERIZATION N/A 01/24/2015   Procedure: Fistulagram;  Surgeon: Conrad [place], MD;  Location: [place] CV LAB;  Service: Cardiovascular;  Laterality: N/A;  . PERIPHERAL VASCULAR CATHETERIZATION Right 06/04/2016   Procedure: A/V Shuntogram/Fistulagram;  Surgeon: Conrad Eagar, MD;  Location: [place] CV LAB;  Service: Cardiovascular;  Laterality: Right;  . REMOVAL OF A DIALYSIS CATHETER N/A 07/14/2018   Procedure: REMOVAL OF A DIALYSIS CATHETER;  Surgeon: Algernon Huxley, MD;  Location: [facility];  Service: Vascular;  Laterality: N/A;  . REVISON OF ARTERIOVENOUS FISTULA Right 01/02/2014   Procedure: REVISON OF ARTERIOVENOUS FISTULA ANASTOMOSIS;  Surgeon: Conrad [place], MD;  Location: [place];  Service: Vascular;  Laterality: Right;  . REVISON OF ARTERIOVENOUS FISTULA Right 01/02/2016   Procedure: REVISION OF RADIOCEPHALIC ARTERIOVENOUS FISTULA  with BOVINE PATCH ANGIOPLASTY RIGHT RADIAL ARTERY;  Surgeon: Mal [name], MD;  Location: [place];  Service: Vascular;  Laterality: Right;  . SHUNTOGRAM N/A 11/07/2013   Procedure: Fistulogram;  Surgeon: [name], MD;  Location: [facility] CATH LAB;  Service: Cardiovascular;  Laterality: N/A;  . THROMBECTOMY AND REVISION OF ARTERIOVENTOUS (AV) GORETEX  GRAFT Right 01/20/2018   Procedure: THROMBECTOMY  OF ARTERIOVENTOUS (AV)  HERO GRAFT;  Surgeon: [name], MD;  Location: [place];  Service: Vascular;  Laterality: Right;  . THROMBECTOMY W/ EMBOLECTOMY Right 07/18/2017   Procedure: THROMBECTOMY ARTERIOVENOUS HERO GRAFT ARM;  Surgeon: [name], MD;  Location: [place];  Service: Vascular;  Laterality: Right;  . THROMBECTOMY W/ EMBOLECTOMY Right 03/07/2018   Procedure: THROMBECTOMY OF HERO GRAFT; POSSIBLE REMOVAL;  Surgeon: Conrad , MD;  Location: [place];  Service: Vascular;  Laterality: Right;  . TRANSMETATARSAL AMPUTATION Right 01/01/2017   Procedure: TRANSMETATARSAL AMPUTATION;  Surgeon: [name], MD;  Location: [place];  Service: Vascular;  Laterality: Right;  [name] VASCULAR ACCESS DEVICE INSERTION Right 05/13/2017   Procedure: INSERTION OF HERO VASCULAR ACCESS DEVICE RIGHT UPPER ARM;  Surgeon: Waynetta Sandy, MD;  Location: [place];  Service: Vascular;  Laterality: Right;  . VENOGRAM N/A 05/13/2017   Procedure: CENTRAL VENOGRAM;   Surgeon: Waynetta Sandy, MD;  Location: [facility] OR;  Service: Vascular;  Laterality: N/A;   Family History  Problem Relation Age of Onset  . Heart attack Brother        85  . Heart disease Brother        before age 16  . Hypertension Brother   . Heart attack Brother        14  . Heart attack Brother        42  . Diabetes Mother   . Hypertension Mother   . Heart disease Mother   . Heart disease Father   . Hypertension Father   . Other Father        amputation  . Hypertension Sister   . Heart disease Sister   . Vision loss Maternal Uncle    Social History:  reports that he has  never smoked. He has never used smokeless tobacco. He reports current alcohol use. He reports that he does not use drugs. Allergies  Allergen Reactions  . Bee Venom Anaphylaxis  . Iodine Swelling  . Penicillins Swelling and Other (See Comments)    SWELLING REACTION UNSPECIFIED   Has patient had a PCN reaction causing immediate rash, facial/tongue/throat swelling, SOB or lightheadedness with hypotension: No Has patient had a PCN reaction causing severe rash involving mucus membranes or skin necrosis: No Has patient had a PCN reaction that required hospitalization No Has patient had a PCN reaction occurring within the last 10 years: No If all of the above answers are "NO", then may proceed with Cephalosporin use.  [name] Plavix [Clopidogrel] Other (See Comments)    Caused Nose bleed  . Sulfadiazine Other (See Comments)    1% Silver Sulfadiazine cream causes burning over a large area of skin.   Prior to Admission medications   Medication Sig Start Date End Date Taking? Authorizing Provider  acetaminophen (TYLENOL) 325 MG tablet Take 650 mg by mouth every 6 (six) hours as needed for mild pain. Do not exceed 4 gms of tylenol in 24 hours    [provider]  atorvastatin (LIPITOR) 40 MG tablet Take 40 mg by mouth daily.    [provider]  B Complex-C-Folic Acid (DIALYVITE PO) Take 1  tablet by mouth every Monday, Wednesday, and Friday.    [provider]  calcium acetate (PHOSLO) 667 MG capsule Take 667-2,001 mg by mouth See admin instructions. Take 3 capsules (2001mg ) three times daily with a meal and 1 capsule (667mg ) with a snack. 11/04/17   [provider]  carvedilol (COREG) 12.5 MG tablet Take 1 tablet (12.5 mg total) by mouth See admin instructions. Take 1 tablet (12.5 mg) by mouth twice daily on Sunday, Tuesday, Thursday, Saturday (hold on dialysis days) 04/10/17   Barton Dubois, MD  cinacalcet (SENSIPAR) 30 MG tablet Take 30 mg by mouth daily with breakfast.     [provider]  clopidogrel (PLAVIX) 75 MG tablet  03/11/18   [provider]  colchicine 0.6 MG tablet Take by mouth. 05/19/11   [provider]  diclofenac sodium (VOLTAREN) 1 % GEL Apply 2 g topically 4 (four) times daily as needed (pain).  11/24/17   [provider]  EPINEPHrine (EPIPEN 2-PAK) 0.3 mg/0.3 mL IJ SOAJ injection Inject 0.3 mg into the muscle once as needed (anaphylaxis).    [provider]  HYDROcodone-acetaminophen (NORCO) 5-325 MG tablet Take 1 tablet by mouth every 6 (six) hours as needed for moderate pain. 07/14/18   Algernon Huxley, MD  LANTUS SOLOSTAR 100 UNIT/ML Solostar Pen Inject 15 Units into the skin at bedtime. 03/02/17   Janece Canterbury, MD  omeprazole (PRILOSEC) 40 MG capsule Take 40 mg by mouth daily.    [provider]  ramipril (ALTACE) 10 MG capsule Take 10 mg by mouth 2 (two) times daily. 10/07/17   [provider]  traMADol Veatrice Bourbon) 50 MG tablet Take by mouth. 05/19/11   [provider]   Current Facility-Administered Medications  Medication Dose Route Frequency Provider Last Rate Last Dose  . 0.9 %  sodium chloride infusion   Intravenous Continuous Nolon Nations, MD 10 mL/hr at 10/25/18 [phone]    . [MAR Hold] acetaminophen (TYLENOL) tablet 650 mg  650 mg Oral Q6H PRN Wynetta Emery, Clanford L, MD   650  mg at 10/25/18 0233   Or  . [MAR Hold] acetaminophen (  TYLENOL) suppository 650 mg  650 mg Rectal Q6H PRN Johnson, Clanford L, MD      . Doug Sou Hold] atorvastatin (LIPITOR) tablet 40 mg  40 mg Oral q1800 Johnson, Clanford L, MD   40 mg at 10/24/18 1741  . [MAR Hold] calcium acetate (PHOSLO) capsule 2,001 mg  2,001 mg Oral TID WC Johnson, Clanford L, MD   2,001 mg at 10/24/18 1703  . [MAR Hold] calcium acetate (PHOSLO) capsule 667 mg  667 mg Oral PRN Skeet Simmer, [facility]      . [MAR Hold] cinacalcet (SENSIPAR) tablet 30 mg  30 mg Oral Q breakfast Johnson, Clanford L, MD      . fentaNYL (SUBLIMAZE) injection 25-50 mcg  25-50 mcg Intravenous Q5 min PRN Nolon Nations, MD      . Doug Sou Hold] heparin injection 5,000 Units  5,000 Units Subcutaneous Q8H Johnson, Clanford L, MD      . Doug Sou Hold] HYDROcodone-acetaminophen (NORCO/VICODIN) 5-325 MG per tablet 1 tablet  1 tablet Oral Q6H PRN Johnson, Clanford L, MD      . Doug Sou Hold] insulin aspart (novoLOG) injection 0-9 Units  0-9 Units Subcutaneous TID WC Johnson, Clanford L, MD   2 Units at 10/24/18 1704  . [MAR Hold] insulin glargine (LANTUS) injection 12 Units  12 Units Subcutaneous QHS Irwin Brakeman L, MD   12 Units at 10/24/18 2143  . meperidine (DEMEROL) injection 6.25-12.5 mg  6.25-12.5 mg Intravenous Q5 min PRN Nolon Nations, MD      . Doug Sou Hold] ondansetron [facility]) tablet 4 mg  4 mg Oral Q6H PRN Johnson, Clanford L, MD       Or  . Doug Sou Hold] ondansetron (ZOFRAN) injection 4 mg  4 mg Intravenous Q6H PRN Johnson, Clanford L, MD      . ondansetron (ZOFRAN) injection 4 mg  4 mg Intravenous Once PRN Nolon Nations, MD      . Doug Sou Hold] pantoprazole (PROTONIX) EC tablet 40 mg  40 mg Oral Daily Johnson, Clanford L, MD   40 mg at 10/24/18 1702   Labs: Basic Metabolic Panel: Recent Labs  Lab 10/24/18 1123 10/24/18 1424 10/25/18 0419  NA 134*  --  134*  K 3.8  --  4.1  CL 95*  --  97*  CO2 21*  --  18*  GLUCOSE 254*  --  226*  BUN 77*  --   84*  CREATININE 14.98* 15.35* 16.52*  CALCIUM 8.0*  --  8.1*  PHOS  --   --  6.8*   Liver Function Tests: Recent Labs  Lab 10/25/18 0419  ALBUMIN 3.1*   CBC: Recent Labs  Lab 10/24/18 1123 10/24/18 1424 10/25/18 0426  WBC 5.6 5.6 5.5  NEUTROABS 3.1  --   --   HGB 15.0 15.5 14.8  HCT 48.8 50.2 47.6  MCV 98.4 96.2 95.4  PLT 210 190 191   CBG: Recent Labs  Lab 10/20/18 1019 10/24/18 1609 10/24/18 2121 10/25/18 0652 10/25/18 1222  GLUCAP 170* 181* 258* 190* 221*    ROS: As per HPI otherwise negative. 4 hr per pt  Physical Exam: Vitals:   10/24/18 1823 10/24/18 2122 10/25/18 0607 10/25/18 1230  BP: (!) 148/82 122/64 (!) 142/75   Pulse: 76 68 71   Resp: 18 18 18  (!) 22  Temp: 97.6 F (36.4 C) (!) 97.5 F (36.4 C) 97.8 F (36.6 C) 97.7 F (36.5 C)  TempSrc: Oral Oral Oral   SpO2: 98% 98% 100%  General: WDWN NAD, obese Head: NCAT sclera not icteric MMM Neck: Supple.  Lungs: CTA anteriorly  Breathing is unlabored. Heart: RRR with S1 S2.  Abdomen: soft NT + BS Lower extremities: LLE no edema, right BKA no edema - Neuro: groggy Dialysis Access: new left fem TDC and right fem AVGG + bruit - mild edema right thigh - no bleeding noted, bruising left upper arm  Dialysis Orders:  MWF [facility]  4 hr 4.75 hr 400/600 temp 35 max UF rate 1.1 2 K 2.5 Ca EDW 118.5 heparin 500 then 2100 per hour sensipar 30 q am Ca acetate 3 ac and 1 with snacks- Hectorol 10   Assessment/Plan: 1.  s/p placement of new TDC and right fem graft - hold heparin with HD today 2.  ESRD -  MWF [facility] - HD today and again tomorrow if still here 3.  Hypertension/volume  - post op BP in PACU low 100s - UF as tolerated today - hard to know how much volume is up 4.  Anemia  -hgb > 12 no ESA or Fe 5.  Metabolic bone disease -  Resume Hectorol Wed. 6.  Nutrition - renal diet  Myriam Jacobson, PA-C [place] [phone] 10/25/2018, 12:36 PM     I have seen and  examined this patient and agree with plan and assessment in the above note with renal recommendations/intervention highlighted.  Will plan on HD today and should be able to be discharged after HD and have his outpatient HD tomorrow.  Governor Rooks [place],MD 10/25/2018 12:44 PM

## 2018-10-25 NOTE — Progress Notes (Signed)
PROGRESS NOTE    Jacob [name]  FUX:[id number] DOB: 06-18-1951 DOA: 10/24/2018 PCP: Sharilyn Sites, MD   Brief Narrative: Patient is a 68 year old male with history of ESRD on dialysis followed by his nephrologist who was sent from dialysis facility because they were unable to use fistula in the left arm due to swelling and discoloration.  Patient was recently discharged from [facility] where he had been admitted for poorly functioning left brachiocephalic AV fistula.He was seen by Dr. Lucky Cowboy and went to OR 3/19 for left brachiocephalic arteriovenous fistula cannulation under ultrasound guidance and left arm fistulagram including central venogram.   Vascular surgery has been consulted and he is undergoing temporary dialysis catheter placement and right femoral AV graft placement by vascular surgery.  Assessment & Plan:   Principal Problem:   Problem with dialysis access [facility]) Active Problems:   Nonischemic cardiomyopathy (HCC)   End-stage renal disease on hemodialysis ([place])   Essential hypertension, benign   Mixed hyperlipidemia   Insulin-requiring or dependent type II diabetes mellitus (HCC)   Normocytic anemia   PAD (peripheral artery disease) (HCC)   Swelling of arm   Hypothyroidism   Failed previous AV graft: Vascular surgery following.He is undergoing temporary dialysis catheter placement and right femoral AV graft placement by vascular surgery.  ESRD on dialysis: Nephrology aware.  He will undergo dialysis after temporary dialysis catheter placement.  Type 2 diabetes mellitus: On Insulin at home.  Continue current insulin regimen.  Continue to monitor blood sugars  Hypothyroidism: Continue Synthyroid  Essential hypertension: We will continue to monitor blood pressure.  Currently blood pressure stable          DVT prophylaxis: [place] heparin Code Status: Full Family Communication: None present at the bedside Disposition Plan: Home after temporary dialysis catheter placement ,  dialysis.[place] tomorrow  Consultants: Nephrology, vascular surgery  Procedures: None  Antimicrobials:  Anti-infectives (From admission, onward)   Start     Dose/Rate Route Frequency Ordered Stop   10/25/18 0845  vancomycin (VANCOCIN) 1,500 mg in sodium chloride 0.9 % 500 mL IVPB     1,500 mg 250 mL/hr over 120 Minutes Intravenous To Surgery 10/25/18 0844 10/25/18 1040      Subjective: Patient seen and examined at the bedside this afternoon in PACU. Hemodynamically stable.  Just had dialysis catheter placement on the left groin and AV fistula on the right groin.Undergoing dialysis now.  Objective: Vitals:   10/24/18 1425 10/24/18 1823 10/24/18 2122 10/25/18 0607  BP: (!) 148/83 (!) 148/82 122/64 (!) 142/75  Pulse: 67 76 68 71  Resp: 18 18 18 18   Temp: 97.6 F (36.4 C) 97.6 F (36.4 C) (!) 97.5 F (36.4 C) 97.8 F (36.6 C)  TempSrc: Oral Oral Oral Oral  SpO2: 100% 98% 98% 100%    Intake/Output Summary (Last 24 hours) at 10/25/2018 1126 Last data filed at 10/25/2018 1046 Gross per 24 hour  Intake 750 ml  Output 50 ml  Net 700 ml   There were no vitals filed for this visit.  Examination:  General exam: Appears calm and comfortable ,Not in distress,obese HEENT:PERRL,Oral mucosa moist, Ear/Nose normal on gross exam Respiratory system: Bilateral equal air entry, normal vesicular breath sounds, no wheezes or crackles  Cardiovascular system: S1 & S2 heard, RRR. No JVD, murmurs, rubs, gallops or clicks. No pedal edema. Gastrointestinal system: Abdomen is nondistended, soft and nontender. No organomegaly or masses felt. Normal bowel sounds heard. Central nervous system: Alert and oriented. No focal neurological deficits. Extremities: No edema,  no clubbing ,no cyanosis, distal peripheral pulses palpable. Skin: No rashes, lesions or ulcers,no icterus ,no pallor, dialysis catheter placement on the left groin and AV fistula on the right groin    Data Reviewed: I have  personally reviewed following labs and imaging studies  CBC: Recent Labs  Lab 10/24/18 1123 10/24/18 1424 10/25/18 0426  WBC 5.6 5.6 5.5  NEUTROABS 3.1  --   --   HGB 15.0 15.5 14.8  HCT 48.8 50.2 47.6  MCV 98.4 96.2 95.4  PLT 210 190 448   Basic Metabolic Panel: Recent Labs  Lab 10/24/18 1123 10/24/18 1424 10/25/18 0419 10/25/18 0426  NA 134*  --  134*  --   K 3.8  --  4.1  --   CL 95*  --  97*  --   CO2 21*  --  18*  --   GLUCOSE 254*  --  226*  --   BUN 77*  --  84*  --   CREATININE 14.98* 15.35* 16.52*  --   CALCIUM 8.0*  --  8.1*  --   MG  --   --   --  2.4  PHOS  --   --  6.8*  --    GFR: Estimated Creatinine Clearance: 5.9 mL/min (A) (by C-G formula based on SCr of 16.52 mg/dL (H)). Liver Function Tests: Recent Labs  Lab 10/25/18 0419  ALBUMIN 3.1*   No results for input(s): LIPASE, AMYLASE in the last 168 hours. No results for input(s): AMMONIA in the last 168 hours. Coagulation Profile: No results for input(s): INR, PROTIME in the last 168 hours. Cardiac Enzymes: No results for input(s): CKTOTAL, CKMB, CKMBINDEX, TROPONINI in the last 168 hours. BNP (last 3 results) No results for input(s): PROBNP in the last 8760 hours. HbA1C: No results for input(s): HGBA1C in the last 72 hours. CBG: Recent Labs  Lab 10/20/18 1019 10/24/18 1609 10/24/18 2121 10/25/18 0652  GLUCAP 170* 181* 258* 190*   Lipid Profile: No results for input(s): CHOL, HDL, LDLCALC, TRIG, CHOLHDL, LDLDIRECT in the last 72 hours. Thyroid Function Tests: No results for input(s): TSH, T4TOTAL, FREET4, T3FREE, THYROIDAB in the last 72 hours. Anemia Panel: No results for input(s): VITAMINB12, FOLATE, FERRITIN, TIBC, IRON, RETICCTPCT in the last 72 hours. Sepsis Labs: No results for input(s): PROCALCITON, LATICACIDVEN in the last 168 hours.  Recent Results (from the past 240 hour(s))  MRSA PCR Screening     Status: None   Collection Time: 10/24/18  3:25 PM  Result Value Ref Range  Status   MRSA by PCR NEGATIVE NEGATIVE Final    Comment:        The GeneXpert MRSA Assay (FDA approved for NASAL specimens only), is one component of a comprehensive MRSA colonization surveillance program. It is not intended to diagnose MRSA infection nor to guide or monitor treatment for MRSA infections. Performed at [facility], [place] [address], [place], [place] [address]          Radiology Studies: Dg Fluoro Guide Cv Line-no Report  Result Date: 10/25/2018 Fluoroscopy was utilized by the requesting physician.  No radiographic interpretation.        Scheduled Meds: . [MAR Hold] atorvastatin  40 mg Oral q1800  . [MAR Hold] calcium acetate  2,001 mg Oral TID WC  . [MAR Hold] cinacalcet  30 mg Oral Q breakfast  . [MAR Hold] heparin  5,000 Units Subcutaneous Q8H  . [MAR Hold] insulin aspart  0-9 Units Subcutaneous TID WC  . [  MAR Hold] insulin glargine  12 Units Subcutaneous QHS  . [MAR Hold] pantoprazole  40 mg Oral Daily   Continuous Infusions: . sodium chloride 10 mL/hr at 10/25/18 0817     LOS: 1 day    Time spent: 35 mins.More than 50% of that time was spent in counseling and/or coordination of care.      Shelly Coss, MD [facility] Pager [phone]  If 7PM-7AM, please contact night-coverage [email or web address] Password [facility] 10/25/2018, 11:26 AM

## 2018-10-25 NOTE — Anesthesia Procedure Notes (Signed)
Procedure Name: Intubation Date/Time: 10/25/2018 10:00 AM Performed by: Jenne Campus, CRNA Pre-anesthesia Checklist: Patient identified, Emergency Drugs available, Suction available and Patient being monitored Patient Re-evaluated:Patient Re-evaluated prior to induction Oxygen Delivery Method: Circle System Utilized Preoxygenation: Pre-oxygenation with 100% oxygen Induction Type: Combination inhalational/ intravenous induction Laryngoscope Size: Miller and 3 Grade View: Grade I Tube type: Oral Tube size: 7.5 mm Number of attempts: 1 Airway Equipment and Method: Stylet and Oral airway Placement Confirmation: ETT inserted through vocal cords under direct vision,  positive ETCO2 and breath sounds checked- equal and bilateral Secured at: 23 cm Tube secured with: Tape Dental Injury: Teeth and Oropharynx as per pre-operative assessment

## 2018-10-25 NOTE — Progress Notes (Signed)
Patient unable to maintain 400 BFR due to high arterial and venous pressures during dialysis treatment.  BFR lowered to 300.  Amalia Hailey, PA present at the bedside and aware.

## 2018-10-25 NOTE — Transfer of Care (Signed)
Immediate Anesthesia Transfer of Care Note  Patient: Jacob [name]  Procedure(s) Performed: INSERTION OF TUNNELED DIALYSIS CATHETER IN THE LEFT FEMORAL VEIN (Left Groin) INSERTION OF RIGHT THIGH GRAFT using Vascular stretch graft (Right Groin)  Patient Location: PACU  Anesthesia Type:General  Level of Consciousness: awake, oriented and patient cooperative  Airway & Oxygen Therapy: Patient Spontanous Breathing and Patient connected to face mask oxygen  Post-op Assessment: Report given to RN and Post -op Vital signs reviewed and stable  Post vital signs: Reviewed  Last Vitals:  Vitals Value Taken Time  BP 77/65 10/25/2018 12:23 PM  Temp    Pulse 82 10/25/2018 12:24 PM  Resp 21 10/25/2018 12:24 PM  SpO2 100 % 10/25/2018 12:24 PM  Vitals shown include unvalidated device data.  Last Pain:  Vitals:   10/25/18 0810  TempSrc:   PainSc: 0-No pain      Patients Stated Pain Goal: 1 (00/37/04 8889)  Complications: No apparent anesthesia complications

## 2018-10-26 ENCOUNTER — Encounter (HOSPITAL_COMMUNITY): Payer: Self-pay | Admitting: Vascular Surgery

## 2018-10-26 LAB — GLUCOSE, CAPILLARY: Glucose-Capillary: 182 mg/dL — ABNORMAL HIGH (ref 70–99)

## 2018-10-26 LAB — CBC
HCT: 44.4 % (ref 39.0–52.0)
HEMOGLOBIN: 13.8 g/dL (ref 13.0–17.0)
MCH: 29.4 pg (ref 26.0–34.0)
MCHC: 31.1 g/dL (ref 30.0–36.0)
MCV: 94.7 fL (ref 80.0–100.0)
NRBC: 0 % (ref 0.0–0.2)
Platelets: 159 10*3/uL (ref 150–400)
RBC: 4.69 MIL/uL (ref 4.22–5.81)
RDW: 14.3 % (ref 11.5–15.5)
WBC: 8.4 10*3/uL (ref 4.0–10.5)

## 2018-10-26 LAB — RENAL FUNCTION PANEL
Albumin: 2.9 g/dL — ABNORMAL LOW (ref 3.5–5.0)
Anion gap: 18 — ABNORMAL HIGH (ref 5–15)
BUN: 56 mg/dL — ABNORMAL HIGH (ref 8–23)
CO2: 22 mmol/L (ref 22–32)
Calcium: 7.9 mg/dL — ABNORMAL LOW (ref 8.9–10.3)
Chloride: 93 mmol/L — ABNORMAL LOW (ref 98–111)
Creatinine, Ser: 13.1 mg/dL — ABNORMAL HIGH (ref 0.61–1.24)
GFR calc non Af Amer: 3 mL/min — ABNORMAL LOW (ref >60)
GFR, EST AFRICAN AMERICAN: 4 mL/min — AB (ref >60)
Glucose, Bld: 276 mg/dL — ABNORMAL HIGH (ref 70–99)
POTASSIUM: 4.1 mmol/L (ref 3.5–5.1)
Phosphorus: 6.4 mg/dL — ABNORMAL HIGH (ref 2.5–4.6)
Sodium: 133 mmol/L — ABNORMAL LOW (ref 135–145)

## 2018-10-26 LAB — HEPATITIS B SURFACE ANTIGEN: Hepatitis B Surface Ag: NEGATIVE

## 2018-10-26 MED ORDER — ALTEPLASE 2 MG IJ SOLR
INTRAMUSCULAR | Status: AC
  Administered 2018-10-26: 4 mg
  Filled 2018-10-26: qty 4

## 2018-10-26 MED ORDER — DOXERCALCIFEROL 4 MCG/2ML IV SOLN
10.0000 ug | INTRAVENOUS | Status: DC
  Filled 2018-10-26: qty 6

## 2018-10-26 MED ORDER — OXYCODONE-ACETAMINOPHEN 5-325 MG PO TABS
1.0000 | ORAL_TABLET | ORAL | Status: DC | PRN
  Administered 2018-10-26: 2 via ORAL
  Filled 2018-10-26: qty 2

## 2018-10-26 MED ORDER — ZOLPIDEM TARTRATE 5 MG PO TABS
5.0000 mg | ORAL_TABLET | Freq: Every evening | ORAL | Status: DC | PRN
  Administered 2018-10-26: 5 mg via ORAL
  Filled 2018-10-26: qty 1

## 2018-10-26 MED ORDER — HEPARIN SODIUM (PORCINE) 1000 UNIT/ML IJ SOLN
INTRAMUSCULAR | Status: AC
  Administered 2018-10-26: 6000 [IU] via INTRAVENOUS_CENTRAL
  Filled 2018-10-26: qty 6

## 2018-10-26 NOTE — Progress Notes (Signed)
Patient discharged to home, AVS reviewed, Patient is aware that he is to hold BP meds if systolic is less than 681 or diastolic is less than 80. Patient is aware of follow up appointments. IV removed, telebox returned, HD cath dressing was clean dry and intact. Left via wheelchair with staff member

## 2018-10-26 NOTE — Discharge Summary (Addendum)
Physician Discharge Summary  Jacob [name] ZOX:[id number] DOB: [birth year] DOA: 10/24/2018  PCP: Sharilyn Sites, MD  Admit date: 10/24/2018 Discharge date: 10/26/2018  Admitted From: Home Disposition:  Home  Discharge Condition:Stable CODE STATUS:FULL Diet recommendation: Heart Healthy  Brief/Interim Summary:  Patient is a 68 year old male with history of ESRD on dialysis followed by his nephrologist who was sent from dialysis facility because they were unable to use fistula in the left arm due to swelling and discoloration.  Patient was recently discharged from [facility] where he had been admitted for poorly functioning left brachiocephalic AV fistula.He was seen by Dr. Lucky Cowboy and went to OR 3/19 for left brachiocephalic arteriovenous fistula cannulation under ultrasound guidance and left arm fistulagram including central venogram.   Vascular surgery was consulted and he he underwent temporary dialysis catheter placement and right femoral AV graft placement .  He was also dialyzed 2 sessions through temporary dialysis catheter on his left groin.  Patient cleared by nephrology and vascular surgery for discharge today.  Following problems were addressed during his hospitalization:  Failed previous AV graft: Vascular surgery following.He  underwent temporary dialysis catheter placement and right femoral AV graft placement by vascular surgery.  ESRD on dialysis: Underwent 2 sessions of  dialysis after temporary dialysis catheter placement.  Type 2 diabetes mellitus: On Insulin at home.  Continue current insulin regimen.  Continue to monitor blood sugars  Hypothyroidism: Continue Synthyroid  Essential hypertension: Blood pressure soft. He can resume his home medications on discharge.   Discharge Diagnoses:  Principal Problem:   Problem with dialysis access [facility]) Active Problems:   Nonischemic cardiomyopathy ([place])   End-stage renal disease on hemodialysis ([place])   Essential hypertension,  benign   Mixed hyperlipidemia   Insulin-requiring or dependent type II diabetes mellitus ([place])   Normocytic anemia   PAD (peripheral artery disease) (HCC)   Swelling of arm   Hypothyroidism    Discharge Instructions  Discharge Instructions    Diet - low sodium heart healthy   Complete by:  As directed    Discharge instructions   Complete by:  As directed    1)Follow up with your PCP in a week. 2)Follow up with vascular surgery as an outpatient. 3)Monitor your blood pressure at home. Donott take your BP medications if your blood pressure is less than 811 mmHg systolic or 80 mmHg diastolic.   Increase activity slowly   Complete by:  As directed      Allergies as of 10/26/2018      Reactions   Bee Venom Anaphylaxis   Iodine Swelling   Penicillins Swelling, Other (See Comments)   SWELLING REACTION UNSPECIFIED  Has patient had a PCN reaction causing immediate rash, facial/tongue/throat swelling, SOB or lightheadedness with hypotension: No Has patient had a PCN reaction causing severe rash involving mucus membranes or skin necrosis: No Has patient had a PCN reaction that required hospitalization No Has patient had a PCN reaction occurring within the last 10 years: No If all of the above answers are "NO", then may proceed with Cephalosporin use.   Plavix [clopidogrel] Other (See Comments)   Caused Nose bleed   Sulfadiazine Other (See Comments)   1% Silver Sulfadiazine cream causes burning over a large area of skin.      Medication List    TAKE these medications   acetaminophen 325 MG tablet Commonly known as:  TYLENOL Take 650 mg by mouth every 6 (six) hours as needed for mild pain. Do not exceed 4 gms of  tylenol in 24 hours   atorvastatin 40 MG tablet Commonly known as:  LIPITOR Take 40 mg by mouth daily.   calcium acetate 667 MG capsule Commonly known as:  PHOSLO Take 667-2,001 mg by mouth See admin instructions. Take 3 capsules (2001mg ) three times daily with a meal  and 1 capsule (667mg ) with a snack.   carvedilol 12.5 MG tablet Commonly known as:  COREG Take 1 tablet (12.5 mg total) by mouth See admin instructions. Take 1 tablet (12.5 mg) by mouth twice daily on Sunday, Tuesday, Thursday, Saturday (hold on dialysis days)   cinacalcet 30 MG tablet Commonly known as:  SENSIPAR Take 30 mg by mouth daily with breakfast.   clopidogrel 75 MG tablet Commonly known as:  PLAVIX   colchicine 0.6 MG tablet Take by mouth.   DIALYVITE PO Take 1 tablet by mouth every Monday, Wednesday, and Friday.   diclofenac sodium 1 % Gel Commonly known as:  VOLTAREN Apply 2 g topically 4 (four) times daily as needed (pain).   EpiPen 2-Pak 0.3 mg/0.3 mL Soaj injection Generic drug:  EPINEPHrine Inject 0.3 mg into the muscle once as needed (anaphylaxis).   HYDROcodone-acetaminophen 5-325 MG tablet Commonly known as:  Norco Take 1 tablet by mouth every 6 (six) hours as needed for moderate pain.   Lantus SoloStar 100 UNIT/ML Solostar Pen Generic drug:  Insulin Glargine Inject 15 Units into the skin at bedtime.   omeprazole 40 MG capsule Commonly known as:  PRILOSEC Take 40 mg by mouth daily.   ramipril 10 MG capsule Commonly known as:  ALTACE Take 10 mg by mouth 2 (two) times daily.   traMADol 50 MG tablet Commonly known as:  ULTRAM Take by mouth.      Follow-up Information    Sharilyn Sites, MD. Schedule an appointment as soon as possible for a visit in 1 week(s).   Specialty:  Family Medicine Contact information: [address] [place] [address] [phone]        Satira Sark, MD .   Specialty:  Cardiology Contact information: [place] [address] [phone]          Allergies  Allergen Reactions  . Bee Venom Anaphylaxis  . Iodine Swelling  . Penicillins Swelling and Other (See Comments)    SWELLING REACTION UNSPECIFIED   Has patient had a PCN reaction causing immediate rash,  facial/tongue/throat swelling, SOB or lightheadedness with hypotension: No Has patient had a PCN reaction causing severe rash involving mucus membranes or skin necrosis: No Has patient had a PCN reaction that required hospitalization No Has patient had a PCN reaction occurring within the last 10 years: No If all of the above answers are "NO", then may proceed with Cephalosporin use.  [name] Plavix [Clopidogrel] Other (See Comments)    Caused Nose bleed  . Sulfadiazine Other (See Comments)    1% Silver Sulfadiazine cream causes burning over a large area of skin.    Consultations:  Vascular surgery, nephrology   Procedures/Studies: Ir Removal Tun Cv Cath W/o Fl  Result Date: 10/04/2018 INDICATION: Patient with history of ESRD via tunneled HD catheter now with working left AVF. Request for removal of tunneled HD catheter today in IR. EXAM: REMOVAL OF TUNNELED HEMODIALYSIS CATHETER MEDICATIONS: 3 mL 1% lidocaine. COMPLICATIONS: None immediate. PROCEDURE: Informed written consent was obtained from the patient following an explanation of the procedure, risks, benefits and alternatives to treatment. A time out was performed prior to the initiation of  the procedure. Maximal barrier sterile technique was utilized including caps, mask, sterile gowns, sterile gloves, large sterile drape, hand hygiene, and Hibiclens. 1% lidocaine with epinephrine was injected under sterile conditions along the subcutaneous tunnel. Utilizing a combination of blunt dissection and gentle traction, the catheter was removed intact. Hemostasis was obtained with manual compression. A dressing was placed. The patient tolerated the procedure well without immediate post procedural complication. IMPRESSION: Successful removal of tunneled dialysis catheter. Read by Candiss Norse, PA-C Electronically Signed   By: Jerilynn Mages.  Shick M.D.   On: 10/04/2018 10:17   Dg Chest Port 1 View  Result Date: 10/25/2018 CLINICAL DATA:  Attempted central  venous catheter placement from a LEFT-sided approach. EXAM: PORTABLE CHEST 1 VIEW COMPARISON:  06/25/2018 and earlier. FINDINGS: Suboptimal inspiration accounts for crowded bronchovascular markings, especially in the bases, and accentuates the cardiac silhouette. Taking this into account, cardiac silhouette normal in size. Mild atelectasis at the LEFT base superimposed upon chronic scarring. Lungs otherwise clear. No pneumothorax. LEFT subclavian vein overlapping stents. IMPRESSION: 1. No pneumothorax after attempted LEFT-sided central venous catheter placement. 2. Suboptimal inspiration which accounts for mild LEFT basilar atelectasis superimposed upon chronic scarring. Electronically Signed   By: Evangeline Dakin M.D.   On: 10/25/2018 12:54   Dg Fluoro Guide Cv Line-no Report  Result Date: 10/25/2018 Fluoroscopy was utilized by the requesting physician.  No radiographic interpretation.      Subjective: Patient seen and examined the bedside this morning at the dialysis session.  No complaints.  Remains comfortable.  Blood pressure was noted to be soft during dialysis sessions.  Discharge Exam: Vitals:   10/26/18 1130 10/26/18 1333  BP: (!) 74/36 (!) 95/46  Pulse: 80 81  Resp: 16 18  Temp: 97.7 F (36.5 C) 98 F (36.7 C)  SpO2: 94% 98%   Vitals:   10/26/18 1030 10/26/18 1100 10/26/18 1130 10/26/18 1333  BP: (!) 68/39 (!) 68/31 (!) 74/36 (!) 95/46  Pulse: 85 83 80 81  Resp: 18  16 18   Temp:   97.7 F (36.5 C) 98 F (36.7 C)  TempSrc:   Oral Oral  SpO2:   94% 98%  Weight:    122 kg    General: Pt is alert, awake, not in acute distress Cardiovascular: RRR, S1/S2 +, no rubs, no gallops Respiratory: CTA bilaterally, no wheezing, no rhonchi Abdominal: Soft, NT, ND, bowel sounds + Extremities: no edema, no cyanosis, left femoral temporary dialysis catheter, right femoral AV graft    The results of significant diagnostics from this hospitalization (including imaging, microbiology,  ancillary and laboratory) are listed below for reference.     Microbiology: Recent Results (from the past 240 hour(s))  MRSA PCR Screening     Status: None   Collection Time: 10/24/18  3:25 PM  Result Value Ref Range Status   MRSA by PCR NEGATIVE NEGATIVE Final    Comment:        The GeneXpert MRSA Assay (FDA approved for NASAL specimens only), is one component of a comprehensive MRSA colonization surveillance program. It is not intended to diagnose MRSA infection nor to guide or monitor treatment for MRSA infections. Performed at [facility], [place] [address] [place], [place] [address]      Labs: BNP (last 3 results) No results for input(s): BNP in the last 8760 hours. Basic Metabolic Panel: Recent Labs  Lab 10/24/18 1123 10/24/18 1424 10/25/18 0419 10/25/18 0426 10/26/18 0647  NA 134*  --  134*  --  133*  K 3.8  --  4.1  --  4.1  CL 95*  --  97*  --  93*  CO2 21*  --  18*  --  22  GLUCOSE 254*  --  226*  --  276*  BUN 77*  --  84*  --  56*  CREATININE 14.98* 15.35* 16.52*  --  13.10*  CALCIUM 8.0*  --  8.1*  --  7.9*  MG  --   --   --  2.4  --   PHOS  --   --  6.8*  --  6.4*   Liver Function Tests: Recent Labs  Lab 10/25/18 0419 10/26/18 0647  ALBUMIN 3.1* 2.9*   No results for input(s): LIPASE, AMYLASE in the last 168 hours. No results for input(s): AMMONIA in the last 168 hours. CBC: Recent Labs  Lab 10/24/18 1123 10/24/18 1424 10/25/18 0426 10/26/18 0647  WBC 5.6 5.6 5.5 8.4  NEUTROABS 3.1  --   --   --   HGB 15.0 15.5 14.8 13.8  HCT 48.8 50.2 47.6 44.4  MCV 98.4 96.2 95.4 94.7  PLT 210 190 191 159   Cardiac Enzymes: No results for input(s): CKTOTAL, CKMB, CKMBINDEX, TROPONINI in the last 168 hours. BNP: Invalid input(s): POCBNP CBG: Recent Labs  Lab 10/25/18 0652 10/25/18 1222 10/25/18 1848 10/25/18 2132 10/26/18 1352  GLUCAP 190* 221* 230* 274* 182*   D-Dimer No results for input(s): DDIMER in the last 72 hours. Hgb  A1c No results for input(s): HGBA1C in the last 72 hours. Lipid Profile No results for input(s): CHOL, HDL, LDLCALC, TRIG, CHOLHDL, LDLDIRECT in the last 72 hours. Thyroid function studies No results for input(s): TSH, T4TOTAL, T3FREE, THYROIDAB in the last 72 hours.  Invalid input(s): FREET3 Anemia work up No results for input(s): VITAMINB12, FOLATE, FERRITIN, TIBC, IRON, RETICCTPCT in the last 72 hours. Urinalysis No results found for: COLORURINE, APPEARANCEUR, [place], [place], [place], [place], [place], [place], PROTEINUR, UROBILINOGEN, NITRITE, LEUKOCYTESUR Sepsis Labs Invalid input(s): PROCALCITONIN,  WBC,  LACTICIDVEN Microbiology Recent Results (from the past 240 hour(s))  MRSA PCR Screening     Status: None   Collection Time: 10/24/18  3:25 PM  Result Value Ref Range Status   MRSA by PCR NEGATIVE NEGATIVE Final    Comment:        The GeneXpert MRSA Assay (FDA approved for NASAL specimens only), is one component of a comprehensive MRSA colonization surveillance program. It is not intended to diagnose MRSA infection nor to guide or monitor treatment for MRSA infections. Performed at [facility], [place] [address] [place], [place] [address]     Please note: You were cared for by a hospitalist during your hospital stay. Once you are discharged, your primary care physician will handle any further medical issues. Please note that NO REFILLS for any discharge medications will be authorized once you are discharged, as it is imperative that you return to your primary care physician (or establish a relationship with a primary care physician if you do not have one) for your post hospital discharge needs so that they can reassess your need for medications and monitor your lab values.    Time coordinating discharge: 40 minutes  SIGNED:   Shelly Coss, MD  [facility] 10/26/2018, 2:35 PM Pager [phone]  If 7PM-7AM, please contact  night-coverage [email or web address] Password [email or web address]

## 2018-10-26 NOTE — Anesthesia Postprocedure Evaluation (Signed)
Anesthesia Post Note  Patient: Jacob [name]  Procedure(s) Performed: INSERTION OF TUNNELED DIALYSIS CATHETER IN THE LEFT FEMORAL VEIN (Left Groin) INSERTION OF RIGHT THIGH GRAFT using Vascular stretch graft (Right Groin)     Patient location during evaluation: PACU Anesthesia Type: General Level of consciousness: sedated and patient cooperative Pain management: pain level controlled Vital Signs Assessment: post-procedure vital signs reviewed and stable Respiratory status: spontaneous breathing Cardiovascular status: stable Anesthetic complications: no    Last Vitals:  Vitals:   10/26/18 1130 10/26/18 1333  BP: (!) 74/36 (!) 95/46  Pulse: 80 81  Resp: 16 18  Temp: 36.5 C 36.7 C  SpO2: 94% 98%    Last Pain:  Vitals:   10/26/18 1500  TempSrc:   PainSc: 0-No pain                 Nolon Nations

## 2018-10-26 NOTE — Procedures (Addendum)
I was present at this dialysis session. I have reviewed the session itself and made appropriate changes.   Vital signs in last 24 hours:  Temp:  [97 F (36.1 C)-97.9 F (36.6 C)] 97.8 F (36.6 C) (03/25 0551) Pulse Rate:  [78-192] 79 (03/25 0551) Resp:  [15-22] 21 (03/25 0551) BP: (75-132)/(31-86) 103/41 (03/25 0551) SpO2:  [47 %-100 %] 98 % (03/25 0551) Weight:  [124 kg-125.5 kg] 124 kg (03/24 2130) Weight change:  Filed Weights   10/25/18 1310 10/25/18 1726 10/25/18 2130  Weight: 125.5 kg 124 kg 124 kg    Recent Labs  Lab 10/26/18 0647  NA 133*  K 4.1  CL 93*  CO2 22  GLUCOSE 276*  BUN 56*  CREATININE 13.10*  CALCIUM 7.9*  PHOS 6.4*    Recent Labs  Lab 10/24/18 1123 10/24/18 1424 10/25/18 0426 10/26/18 0647  WBC 5.6 5.6 5.5 8.4  NEUTROABS 3.1  --   --   --   HGB 15.0 15.5 14.8 13.8  HCT 48.8 50.2 47.6 44.4  MCV 98.4 96.2 95.4 94.7  PLT 210 190 191 159    Scheduled Meds: . atorvastatin  40 mg Oral q1800  . calcium acetate  2,001 mg Oral TID WC  . Chlorhexidine Gluconate Cloth  6 each Topical Q0600  . cinacalcet  30 mg Oral Q breakfast  . heparin  5,000 Units Subcutaneous Q8H  . insulin aspart  0-9 Units Subcutaneous TID WC  . insulin glargine  12 Units Subcutaneous QHS  . pantoprazole  40 mg Oral Daily   Continuous Infusions: . sodium chloride 10 mL/hr at 10/25/18 0817   PRN Meds:.acetaminophen **OR** acetaminophen, calcium acetate, ondansetron **OR** ondansetron (ZOFRAN) IV, oxyCODONE-acetaminophen, zolpidem    Dialysis Orders:  MWF [facility]  4 hr 4.75 hr 400/600 temp 35 max UF rate 1.1 2 K 2.5 Ca EDW 118.5 heparin 500 then 2100 per hour sensipar 30 q am Ca acetate 3 ac and 1 with snacks- Hectorol 10   Assessment/Plan: 1.  s/p placement of new TDC and right fem graft - hold heparin with HD today 2.  ESRD -  MWF [facility] - HD today and again tomorrow if still here 3.  Hypertension/volume  - post op BP in PACU low 100s - UF as tolerated today -  hard to know how much volume is up 1. BP low on HD but is asymptomatic. 4.  Anemia  -hgb > 12 no ESA or Fe 5.  Metabolic bone disease -  Resume Hectorol Wed. 6.  Nutrition - renal diet 7. Disposition- hopeful discharge later today per [facility]. Donetta Potts,  MD 10/26/2018, 8:22 AM

## 2018-10-26 NOTE — Progress Notes (Addendum)
[facility]  Subjective  - Doing well over all.   Objective (!) 77/34 83 97.7 F (36.5 C) (Oral) 20 98%  Intake/Output Summary (Last 24 hours) at 10/26/2018 1013 Last data filed at 10/26/2018 0551 Gross per 24 hour  Intake 1030 ml  Output 900 ml  Net 130 ml    Right thigh will palpable thrill in loop graft, incision healing well Left TDC working well currently on HD  Assessment/Planning: POD # 1  Procedure Performed: 1.  Ultrasound-guided cannulation bilateral internal jugular veins 2.  Placement of 55 cm palindrome catheter and left common femoral vein with ultrasound guidance 3.  Right SFA to common femoral vein 4-7 mm loop AV graft  May stick right thigh loop graft in 4 weeks.  Roxy Horseman 10/26/2018 10:13 AM --  Laboratory Lab Results: Recent Labs    10/25/18 0426 10/26/18 0647  WBC 5.5 8.4  HGB 14.8 13.8  HCT 47.6 44.4  PLT 191 159   BMET Recent Labs    10/25/18 0419 10/26/18 0647  NA 134* 133*  K 4.1 4.1  CL 97* 93*  CO2 18* 22  GLUCOSE 226* 276*  BUN 84* 56*  CREATININE 16.52* 13.10*  CALCIUM 8.1* 7.9*    COAG Lab Results  Component Value Date   INR 1.13 07/12/2018   INR 1.10 06/14/2018   INR 1.40 09/07/2017   No results found for: PTT

## 2018-10-27 ENCOUNTER — Telehealth: Payer: Self-pay | Admitting: Cardiology

## 2018-10-27 LAB — HEPATITIS B SURFACE ANTIGEN: Hepatitis B Surface Ag: NEGATIVE

## 2018-10-27 NOTE — Telephone Encounter (Signed)
Upcoming appt changed to a telephone visit.

## 2018-10-27 NOTE — Telephone Encounter (Signed)
Patient returned call

## 2018-10-28 DIAGNOSIS — N186 End stage renal disease: Secondary | ICD-10-CM | POA: Diagnosis not present

## 2018-10-28 DIAGNOSIS — D509 Iron deficiency anemia, unspecified: Secondary | ICD-10-CM | POA: Diagnosis not present

## 2018-10-28 DIAGNOSIS — T82898A Other specified complication of vascular prosthetic devices, implants and grafts, initial encounter: Secondary | ICD-10-CM | POA: Diagnosis not present

## 2018-10-28 DIAGNOSIS — Z794 Long term (current) use of insulin: Secondary | ICD-10-CM | POA: Diagnosis not present

## 2018-10-28 DIAGNOSIS — N2581 Secondary hyperparathyroidism of renal origin: Secondary | ICD-10-CM | POA: Diagnosis not present

## 2018-10-28 DIAGNOSIS — Z992 Dependence on renal dialysis: Secondary | ICD-10-CM | POA: Diagnosis not present

## 2018-10-28 DIAGNOSIS — E119 Type 2 diabetes mellitus without complications: Secondary | ICD-10-CM | POA: Diagnosis not present

## 2018-10-31 ENCOUNTER — Encounter: Payer: Self-pay | Admitting: *Deleted

## 2018-10-31 ENCOUNTER — Telehealth: Payer: Self-pay | Admitting: *Deleted

## 2018-10-31 ENCOUNTER — Encounter: Payer: Self-pay | Admitting: Cardiology

## 2018-10-31 DIAGNOSIS — Z992 Dependence on renal dialysis: Secondary | ICD-10-CM | POA: Diagnosis not present

## 2018-10-31 DIAGNOSIS — N186 End stage renal disease: Secondary | ICD-10-CM | POA: Diagnosis not present

## 2018-10-31 NOTE — Progress Notes (Signed)
Virtual Visit via Telephone Note    Evaluation Performed:  Follow-up visit  This visit type was conducted due to national recommendations for restrictions regarding the COVID-19 Pandemic (e.g. social distancing).  This format is felt to be most appropriate for this patient at this time.  All issues noted in this document were discussed and addressed.  No physical exam was performed (except for noted visual exam findings with Video Visits).  The patient verbally consented to a telehealth phone visit today with [facility] and understands that their insurance company will be billed for the encounter.  Date:  11/01/2018   ID:  Jacob [name], DOB 02-07-1951, MRN [id number]  Patient Location:  [address], [place] [address]  Provider Location:   [facility] [place], [place], [place] [address] Phone: [phone]; Fax: [phone]  PCP:  Sharilyn Sites, MD  Cardiologist:  Rozann Lesches, MD  Chief Complaint: Follow-up shortness of breath  History of Present Illness:    Jacob [name] is a 68 y.o. male who presents via audio conferencing for a telehealth visit today.  He was last seen in February 2019.  He tells me that he is still recuperating from recent surgery. Record review finds recent hospitalization due to difficulties with hemodialysis vascular access.  He ultimately underwent placement of a new right femoral AV graft with temporary dialysis catheter placed in the meanwhile.  He will have a follow-up visit with Dr. Donzetta Matters.    He reports tolerating routine hemodialysis via the temporary catheter.  Blood pressure has been on the low side however, recent measurement documented below.  He has had no dizziness and states that his hemodialysis sessions have not been interrupted.  His most recent follow-up echocardiogram in July 2019 revealed severe LVH with LVEF 55 to 98% and mild diastolic dysfunction.  Current cardiac regimen includes Coreg,  he is no longer on Altace.  He is also on Plavix with prior history of stroke.  Prior CV studies:   The following studies were reviewed today:  Echocardiogram 02/10/2018: Study Conclusions  - Left ventricle: The cavity size was normal. Wall thickness was   increased in a pattern of severe LVH. Systolic function was   normal. The estimated ejection fraction was in the range of 55%   to 60%. Wall motion was normal; there were no regional wall   motion abnormalities. Doppler parameters are consistent with   abnormal left ventricular relaxation (grade 1 diastolic   dysfunction). - Aortic valve: Mildly calcified annulus. Trileaflet; mildly   thickened leaflets. Valve area (VTI): 2.42 cm^2. Valve area   (Vmax): 2.67 cm^2. Valve area (Vmean): 2.13 cm^2. - Mitral valve: Mildly calcified annulus. Normal thickness leaflets. - Technically adequate study.  ECG 06/25/2018: I personally reviewed the tracing which shows probable sinus rhythm versus ectopic atrial rhythm with prolonged PR interval, left anterior fascicular block, and poor anterolateral R wave progression, nonspecific ST changes.  Past Medical History:  Diagnosis Date  . Anemia in chronic kidney disease(285.21)   . Anxiety   . Arthritis   . Depression   . ESRD (end stage renal disease) on dialysis [facility])    "MWF; [facility]" (02/18/2017)  . Essential hypertension   . GERD (gastroesophageal reflux disease)   . Glaucoma   . History of blood transfusion   . History of stroke 2016  . Insomnia   . Nonischemic cardiomyopathy ([place])   . Type 2 diabetes mellitus ([place])  Past Surgical History:  Procedure Laterality Date  . A/V FISTULAGRAM Right 10/06/2016   Procedure: A/V Fistulagram;  Surgeon: [name], MD;  Location: [place] [facility];  Service: Cardiovascular;  Laterality: Right;  . A/V FISTULAGRAM Right 04/27/2017   Procedure: A/V Fistulagram;  Surgeon: [name], MD;  Location: [place] [facility];  Service:  Cardiovascular;  Laterality: Right;  . A/V FISTULAGRAM Left 10/20/2018   Procedure: A/V FISTULAGRAM;  Surgeon: Algernon Huxley, MD;  Location: [place] [facility];  Service: Cardiovascular;  Laterality: Left;  . A/V SHUNTOGRAM Right 11/17/2017   Procedure: A/V SHUNTOGRAM;  Surgeon: Waynetta Sandy, MD;  Location: [place] [facility];  Service: Cardiovascular;  Laterality: Right;  . ABDOMINAL AORTOGRAM N/A 12/30/2016   Procedure: Abdominal Aortogram;  Surgeon: Waynetta Sandy, MD;  Location: [place] [facility];  Service: Cardiovascular;  Laterality: N/A;  . ABDOMINAL AORTOGRAM N/A 04/08/2017   Procedure: ABDOMINAL AORTOGRAM;  Surgeon: Conrad Bluff[place], MD;  Location: [place] [facility];  Service: Cardiovascular;  Laterality: N/A;  . ABDOMINAL AORTOGRAM W/LOWER EXTREMITY N/A 02/23/2017   Procedure: Abdominal Aortogram w/Lower Extremity;  Surgeon: [name], MD;  Location: [place] [facility];  Service: Cardiovascular;  Laterality: N/A;  Rt. leg  . AMPUTATION Right 02/28/2017   Procedure: RIGHT BELOW KNEE AMPUTATION;  Surgeon: [name], MD;  Location: [place];  Service: Vascular;  Laterality: Right;  . AV FISTULA PLACEMENT     Hx: of  . AV FISTULA PLACEMENT Right 05/04/2013   Procedure: ARTERIOVENOUS (AV) FISTULA CREATION- RIGHT ARM;  Surgeon: Conrad [place]view, MD;  Location: [place];  Service: Vascular;  Laterality: Right;  Ultrasound guided  . AV FISTULA PLACEMENT Right 07/28/2016   Procedure: BRACHIOCEPHALIC ARTERIOVENOUS (AV) FISTULA CREATION;  Surgeon: [name], MD;  Location: [place];  Service: Vascular;  Laterality: Right;  . AV FISTULA PLACEMENT Left 06/16/2018   Procedure: ARTERIOVENOUS (AV) FISTULA CREATION ( BRACHIALCEPHALIC );  Surgeon: Algernon Huxley, MD;  Location: [facility];  Service: Vascular;  Laterality: Left;  . AV FISTULA PLACEMENT Right 10/25/2018   Procedure: INSERTION OF RIGHT THIGH GRAFT using Vascular stretch graft;  Surgeon: Waynetta Sandy, MD;   Location: [place];  Service: Vascular;  Laterality: Right;  . BACK SURGERY    . BELOW KNEE LEG AMPUTATION     1 PRIOR AMPUTATION ON FOOT  RT LEG/ FOOT  . CAPD INSERTION N/A 03/11/2018   Procedure: LAPAROSCOPIC INSERTION OF PERIOTONEAL DIALYSIS CATHETER;  Surgeon: Clovis Riley, MD;  Location: [place];  Service: General;  Laterality: N/A;  . CATARACT EXTRACTION W/PHACO Left 07/16/2014   Procedure: CATARACT EXTRACTION PHACO AND INTRAOCULAR LENS PLACEMENT ([place]);  Surgeon: [name], MD;  Location: [facility];  Service: Ophthalmology;  Laterality: Left;  CDE:8.86  . CATARACT EXTRACTION W/PHACO Right 07/30/2014   Procedure: CATARACT EXTRACTION PHACO AND INTRAOCULAR LENS PLACEMENT ([facility]);  Surgeon: [name], MD;  Location: [facility];  Service: Ophthalmology;  Laterality: Right;  CDE 8.99  . COLONOSCOPY     Hx: of  . EYE SURGERY     bilateral cataract  . FISTULA SUPERFICIALIZATION Right 07/28/2016   Procedure: FISTULA SUPERFICIALIZATION;  Surgeon: [name], MD;  Location: [place];  Service: Vascular;  Laterality: Right;  . FISTULA SUPERFICIALIZATION Right 10/13/2016   Procedure: BRACHIOCEPHALIC ARTERIOVENOUS FISTULA SUPERFICIALIZATION;  Surgeon: [name], MD;  Location: [place];  Service: Vascular;  Laterality: Right;  . FISTULOGRAM N/A 05/04/2013  Procedure: CENTRAL VENOGRAM;  Surgeon: Conrad [place], MD;  Location: [place];  Service: Vascular;  Laterality: N/A;  . INSERTION OF DIALYSIS CATHETER Right 05/04/2013   Procedure: INSERTION OF DIALYSIS CATHETER;  Surgeon: Conrad Echo, MD;  Location: [place];  Service: Vascular;  Laterality: Right;  Ultrasound guided  . INSERTION OF DIALYSIS CATHETER N/A 07/28/2016   Procedure: INSERTION OF DIALYSIS CATHETER;  Surgeon: [name], MD;  Location: [place];  Service: Vascular;  Laterality: N/A;  . INSERTION OF DIALYSIS CATHETER Right 03/07/2018   Procedure: INSERTION OF DIALYSIS CATHETER;  Surgeon: Conrad Carlton, MD;  Location: [place];   Service: Vascular;  Laterality: Right;  . INSERTION OF DIALYSIS CATHETER Left 10/25/2018   Procedure: INSERTION OF TUNNELED DIALYSIS CATHETER IN THE LEFT FEMORAL VEIN;  Surgeon: Waynetta Sandy, MD;  Location: [place];  Service: Vascular;  Laterality: Left;  . IR FLUORO GUIDE CV LINE RIGHT  04/13/2017  . IR FLUORO GUIDE CV LINE RIGHT  05/19/2018  . IR REMOVAL TUN CV CATH W/O FL  10/04/2018  . IR THROMBECTOMY AV FISTULA W/THROMBOLYSIS/PTA/STENT INC/SHUNT/IMG RT Right 09/07/2017  . IR US GUIDE VASC ACCESS RIGHT  04/13/2017  . IR US GUIDE VASC ACCESS RIGHT  09/07/2017  . LIGATION OF ARTERIOVENOUS  FISTULA Left 05/04/2013   Procedure: LIGATION OF LEFT RADIAL CEPHALIC ARTERIOVENOUS  FISTULA;  Surgeon: Conrad [place], MD;  Location: [place];  Service: Vascular;  Laterality: Left;  Ultrasound guided  . LIGATION OF ARTERIOVENOUS  FISTULA Right 07/28/2016   Procedure: LIGATION OF RADIOCEPHALIC ARTERIOVENOUS  FISTULA;  Surgeon: [name], MD;  Location: [place];  Service: Vascular;  Laterality: Right;  . LOWER EXTREMITY ANGIOGRAPHY N/A 12/30/2016   Procedure: Lower Extremity Angiography;  Surgeon: Waynetta Sandy, MD;  Location: [place] [facility];  Service: Cardiovascular;  Laterality: N/A;  . LUMBAR SPINE SURGERY    . PERIPHERAL VASCULAR ATHERECTOMY Right 12/30/2016   Procedure: Peripheral Vascular Atherectomy;  Surgeon: Waynetta Sandy, MD;  Location: [place] [facility];  Service: Cardiovascular;  Laterality: Right;  AT and PT  . PERIPHERAL VASCULAR BALLOON ANGIOPLASTY Right 12/30/2016   Procedure: Peripheral Vascular Balloon Angioplasty;  Surgeon: Waynetta Sandy, MD;  Location: [place] [facility];  Service: Cardiovascular;  Laterality: Right;  PTA of PT and AT  . PERIPHERAL VASCULAR BALLOON ANGIOPLASTY  02/23/2017   Procedure: Peripheral Vascular Balloon Angioplasty;  Surgeon: [name], MD;  Location: [place] [facility];  Service: Cardiovascular;;  RT. Anterior  Tib.  [name] PERIPHERAL VASCULAR BALLOON ANGIOPLASTY  04/08/2017   Procedure: PERIPHERAL VASCULAR BALLOON ANGIOPLASTY;  Surgeon: Conrad [place], MD;  Location: [place] [facility];  Service: Cardiovascular;;  . PERIPHERAL VASCULAR CATHETERIZATION N/A 01/24/2015   Procedure: Fistulagram;  Surgeon: Conrad Ni[place], MD;  Location: [place] [facility];  Service: Cardiovascular;  Laterality: N/A;  . PERIPHERAL VASCULAR CATHETERIZATION Right 06/04/2016   Procedure: A/V Shuntogram/Fistulagram;  Surgeon: Conrad [place], MD;  Location: [place] [facility];  Service: Cardiovascular;  Laterality: Right;  . REMOVAL OF A DIALYSIS CATHETER N/A 07/14/2018   Procedure: REMOVAL OF A DIALYSIS CATHETER;  Surgeon: Algernon Huxley, MD;  Location: [facility];  Service: Vascular;  Laterality: N/A;  . REVISON OF ARTERIOVENOUS FISTULA Right 01/02/2014   Procedure: REVISON OF ARTERIOVENOUS FISTULA ANASTOMOSIS;  Surgeon: Conrad [place], MD;  Location: [place];  Service: Vascular;  Laterality: Right;  . REVISON OF ARTERIOVENOUS FISTULA Right 01/02/2016   Procedure:  REVISION OF RADIOCEPHALIC ARTERIOVENOUS FISTULA  with BOVINE PATCH ANGIOPLASTY RIGHT RADIAL ARTERY;  Surgeon: Mal [name], MD;  Location: [place];  Service: Vascular;  Laterality: Right;  . SHUNTOGRAM N/A 11/07/2013   Procedure: Fistulogram;  Surgeon: [name], MD;  Location: [facility] CATH LAB;  Service: Cardiovascular;  Laterality: N/A;  . THROMBECTOMY AND REVISION OF ARTERIOVENTOUS (AV) GORETEX  GRAFT Right 01/20/2018   Procedure: THROMBECTOMY  OF ARTERIOVENTOUS (AV)  HERO GRAFT;  Surgeon: [name], MD;  Location: [place];  Service: Vascular;  Laterality: Right;  . THROMBECTOMY W/ EMBOLECTOMY Right 07/18/2017   Procedure: THROMBECTOMY ARTERIOVENOUS HERO GRAFT ARM;  Surgeon: [name], MD;  Location: [place];  Service: Vascular;  Laterality: Right;  . THROMBECTOMY W/ EMBOLECTOMY Right 03/07/2018   Procedure: THROMBECTOMY OF HERO GRAFT; POSSIBLE REMOVAL;  Surgeon: Conrad [place], MD;   Location: [place];  Service: Vascular;  Laterality: Right;  . TRANSMETATARSAL AMPUTATION Right 01/01/2017   Procedure: TRANSMETATARSAL AMPUTATION;  Surgeon: [name], MD;  Location: [place];  Service: Vascular;  Laterality: Right;  [name] VASCULAR ACCESS DEVICE INSERTION Right 05/13/2017   Procedure: INSERTION OF HERO VASCULAR ACCESS DEVICE RIGHT UPPER ARM;  Surgeon: Waynetta Sandy, MD;  Location: [place];  Service: Vascular;  Laterality: Right;  . VENOGRAM N/A 05/13/2017   Procedure: CENTRAL VENOGRAM;  Surgeon: Waynetta Sandy, MD;  Location: [facility];  Service: Vascular;  Laterality: N/A;     Current Meds  Medication Sig  . acetaminophen (TYLENOL) 325 MG tablet Take 650 mg by mouth every 6 (six) hours as needed for mild pain. Do not exceed 4 gms of tylenol in 24 hours  . atorvastatin (LIPITOR) 40 MG tablet Take 40 mg by mouth daily.  . B Complex-C-Folic Acid (DIALYVITE PO) Take 1 tablet by mouth every Monday, Wednesday, and Friday.  . calcium acetate (PHOSLO) 667 MG capsule Take 667-2,001 mg by mouth See admin instructions. Take 3 capsules (2001mg ) three times daily with a meal and 1 capsule (667mg ) with a snack.  . carvedilol (COREG) 12.5 MG tablet Take 1 tablet (12.5 mg total) by mouth See admin instructions. Take 1 tablet (12.5 mg) by mouth twice daily on Sunday, Tuesday, Thursday, Saturday (hold on dialysis days)  . cinacalcet (SENSIPAR) 30 MG tablet Take 30 mg by mouth daily with breakfast.   . clopidogrel (PLAVIX) 75 MG tablet Take 75 mg by mouth daily.   [name] EPINEPHrine (EPIPEN 2-PAK) 0.3 mg/0.3 mL IJ SOAJ injection Inject 0.3 mg into the muscle once as needed (anaphylaxis).  [name] HYDROcodone-acetaminophen (NORCO) 5-325 MG tablet Take 1 tablet by mouth every 6 (six) hours as needed for moderate pain.  [name] LANTUS SOLOSTAR 100 UNIT/ML Solostar Pen Inject 15 Units into the skin at bedtime.  [name] omeprazole (PRILOSEC) 40 MG capsule Take 40 mg by mouth daily.     Allergies:   Bee  venom; Iodine; Penicillins; Plavix [clopidogrel]; and Sulfadiazine   Social History   Tobacco Use  . Smoking status: Never Smoker  . Smokeless tobacco: Never Used  Substance Use Topics  . Alcohol use: Yes    Comment: 1- fifth of gin a week  . Drug use: No     Family Hx: The patient's family history includes Diabetes in his mother; Heart attack in his brother, brother, and brother; Heart disease in his brother, father, mother, and sister; Hypertension in his brother, father, mother, and sister; Other in his father; Vision loss in his maternal uncle.  ROS:  Please see the history of present illness. All other systems reviewed and are negative.   Labs/Other Tests and Data Reviewed:    Recent Labs: 06/14/2018: ALT 15 10/25/2018: Magnesium 2.4 10/26/2018: BUN 56; Creatinine, Ser 13.10; Hemoglobin 13.8; Platelets 159; Potassium 4.1; Sodium 133    Wt Readings from Last 3 Encounters:  11/01/18 268 lb (121.6 kg)  10/26/18 268 lb 15.4 oz (122 kg)  10/20/18 268 lb (121.6 kg)     Exam:    Vital Signs:  BP (!) 105/45   Ht 6\' 2"  (1.88 m)   Wt 268 lb (121.6 kg)   BMI 34.41 kg/m    ASSESSMENT & PLAN:    1.  Nonischemic cardiomyopathy with normalization of LVEF, last assessed in July 2019 in the range of 55 to 60%.  He is on Coreg at this time, Altace has been discontinued in light of low blood pressures.  He reports NYHA class II dyspnea with typical low-level activities.  2.  ESRD on hemodialysis.  He has had recurring difficulties with vascular access. Now status post new right femoral AV graft with temporary dialysis catheter.  Follow-up pending with Dr. Donzetta Matters.  3.  History of essential hypertension.  Blood pressure low normal range most recently.  4.  History of stroke.  He is on Plavix at this time.  Also continues on Lipitor.  Patient Risk:   After full review of this patients clinical status, I feel that they are at least moderate risk at this time.  Time:   Today, I  have spent 6 minutes with the patient with telehealth technology discussing current medical therapy, recent hospital stay.     Medication Adjustments/Labs and Tests Ordered: Current medicines are reviewed at length with the patient today.  Concerns regarding medicines are outlined above.  Tests Ordered: Orders Placed This Encounter  Procedures  . ECHOCARDIOGRAM COMPLETE   Medication Changes: No orders of the defined types were placed in this encounter.   Disposition:  Follow up 6 months  Signed, Rozann Lesches, MD  11/01/2018 9:43 AM    [place]

## 2018-10-31 NOTE — Telephone Encounter (Signed)
Patient reminded to have weight and vitals ready before the phone call begins.

## 2018-10-31 NOTE — Telephone Encounter (Signed)
Chart reviewed with patient.  Stated that he has been in hospital recently - [facility].    The patient verbally consented for a telehealth phone visit with [facility] and understands that his insurance company will be billed for the encounter.

## 2018-11-01 ENCOUNTER — Encounter: Payer: Self-pay | Admitting: Cardiology

## 2018-11-01 ENCOUNTER — Telehealth (INDEPENDENT_AMBULATORY_CARE_PROVIDER_SITE_OTHER): Payer: Medicare Other | Admitting: Cardiology

## 2018-11-01 VITALS — BP 105/45 | Ht 74.0 in | Wt 268.0 lb

## 2018-11-01 DIAGNOSIS — Z992 Dependence on renal dialysis: Secondary | ICD-10-CM

## 2018-11-01 DIAGNOSIS — N186 End stage renal disease: Secondary | ICD-10-CM

## 2018-11-01 DIAGNOSIS — D509 Iron deficiency anemia, unspecified: Secondary | ICD-10-CM | POA: Diagnosis not present

## 2018-11-01 DIAGNOSIS — I428 Other cardiomyopathies: Secondary | ICD-10-CM | POA: Diagnosis not present

## 2018-11-01 DIAGNOSIS — N2581 Secondary hyperparathyroidism of renal origin: Secondary | ICD-10-CM | POA: Diagnosis not present

## 2018-11-01 DIAGNOSIS — T82898A Other specified complication of vascular prosthetic devices, implants and grafts, initial encounter: Secondary | ICD-10-CM | POA: Diagnosis not present

## 2018-11-01 NOTE — Patient Instructions (Signed)
Medication Instructions:   Your physician recommends that you continue on your current medications as directed. Please refer to the Current Medication list given to you today.  Labwork:    Testing/Procedures: Your physician has requested that you have an echocardiogram in 6 months just before your next visit. Echocardiography is a painless test that uses sound waves to create images of your heart. It provides your doctor with information about the size and shape of your heart and how well your heart's chambers and valves are working. This procedure takes approximately one hour. There are no restrictions for this procedure.  Follow-Up:  Your physician recommends that you schedule a follow-up appointment in: 6 months. You will receive a reminder letter in the mail in about 4 months reminding you to call and schedule your appointment. If you don't receive this letter, please contact our office.  Any Other Special Instructions Will Be Listed Below (If Applicable).  If you need a refill on your cardiac medications before your next appointment, please call your pharmacy.

## 2018-11-03 DIAGNOSIS — E1021 Type 1 diabetes mellitus with diabetic nephropathy: Secondary | ICD-10-CM | POA: Diagnosis not present

## 2018-11-03 DIAGNOSIS — N186 End stage renal disease: Secondary | ICD-10-CM | POA: Diagnosis not present

## 2018-11-03 DIAGNOSIS — I509 Heart failure, unspecified: Secondary | ICD-10-CM | POA: Diagnosis not present

## 2018-11-03 DIAGNOSIS — Z6834 Body mass index (BMI) 34.0-34.9, adult: Secondary | ICD-10-CM | POA: Diagnosis not present

## 2018-11-04 DIAGNOSIS — D509 Iron deficiency anemia, unspecified: Secondary | ICD-10-CM | POA: Diagnosis not present

## 2018-11-04 DIAGNOSIS — N2581 Secondary hyperparathyroidism of renal origin: Secondary | ICD-10-CM | POA: Diagnosis not present

## 2018-11-04 DIAGNOSIS — N186 End stage renal disease: Secondary | ICD-10-CM | POA: Diagnosis not present

## 2018-11-04 DIAGNOSIS — Z992 Dependence on renal dialysis: Secondary | ICD-10-CM | POA: Diagnosis not present

## 2018-11-07 DIAGNOSIS — N2581 Secondary hyperparathyroidism of renal origin: Secondary | ICD-10-CM | POA: Diagnosis not present

## 2018-11-07 DIAGNOSIS — N186 End stage renal disease: Secondary | ICD-10-CM | POA: Diagnosis not present

## 2018-11-07 DIAGNOSIS — Z992 Dependence on renal dialysis: Secondary | ICD-10-CM | POA: Diagnosis not present

## 2018-11-07 DIAGNOSIS — D509 Iron deficiency anemia, unspecified: Secondary | ICD-10-CM | POA: Diagnosis not present

## 2018-11-08 DIAGNOSIS — E113592 Type 2 diabetes mellitus with proliferative diabetic retinopathy without macular edema, left eye: Secondary | ICD-10-CM | POA: Diagnosis not present

## 2018-11-08 DIAGNOSIS — E113591 Type 2 diabetes mellitus with proliferative diabetic retinopathy without macular edema, right eye: Secondary | ICD-10-CM | POA: Diagnosis not present

## 2018-11-09 DIAGNOSIS — D509 Iron deficiency anemia, unspecified: Secondary | ICD-10-CM | POA: Diagnosis not present

## 2018-11-09 DIAGNOSIS — N2581 Secondary hyperparathyroidism of renal origin: Secondary | ICD-10-CM | POA: Diagnosis not present

## 2018-11-09 DIAGNOSIS — N186 End stage renal disease: Secondary | ICD-10-CM | POA: Diagnosis not present

## 2018-11-09 DIAGNOSIS — Z992 Dependence on renal dialysis: Secondary | ICD-10-CM | POA: Diagnosis not present

## 2018-11-11 DIAGNOSIS — N186 End stage renal disease: Secondary | ICD-10-CM | POA: Diagnosis not present

## 2018-11-11 DIAGNOSIS — D509 Iron deficiency anemia, unspecified: Secondary | ICD-10-CM | POA: Diagnosis not present

## 2018-11-11 DIAGNOSIS — N2581 Secondary hyperparathyroidism of renal origin: Secondary | ICD-10-CM | POA: Diagnosis not present

## 2018-11-11 DIAGNOSIS — Z992 Dependence on renal dialysis: Secondary | ICD-10-CM | POA: Diagnosis not present

## 2018-11-14 DIAGNOSIS — N2581 Secondary hyperparathyroidism of renal origin: Secondary | ICD-10-CM | POA: Diagnosis not present

## 2018-11-14 DIAGNOSIS — D509 Iron deficiency anemia, unspecified: Secondary | ICD-10-CM | POA: Diagnosis not present

## 2018-11-14 DIAGNOSIS — Z992 Dependence on renal dialysis: Secondary | ICD-10-CM | POA: Diagnosis not present

## 2018-11-14 DIAGNOSIS — N186 End stage renal disease: Secondary | ICD-10-CM | POA: Diagnosis not present

## 2018-11-16 DIAGNOSIS — Z992 Dependence on renal dialysis: Secondary | ICD-10-CM | POA: Diagnosis not present

## 2018-11-16 DIAGNOSIS — N2581 Secondary hyperparathyroidism of renal origin: Secondary | ICD-10-CM | POA: Diagnosis not present

## 2018-11-16 DIAGNOSIS — N186 End stage renal disease: Secondary | ICD-10-CM | POA: Diagnosis not present

## 2018-11-16 DIAGNOSIS — D509 Iron deficiency anemia, unspecified: Secondary | ICD-10-CM | POA: Diagnosis not present

## 2018-11-18 DIAGNOSIS — N2581 Secondary hyperparathyroidism of renal origin: Secondary | ICD-10-CM | POA: Diagnosis not present

## 2018-11-18 DIAGNOSIS — D509 Iron deficiency anemia, unspecified: Secondary | ICD-10-CM | POA: Diagnosis not present

## 2018-11-18 DIAGNOSIS — N186 End stage renal disease: Secondary | ICD-10-CM | POA: Diagnosis not present

## 2018-11-18 DIAGNOSIS — Z992 Dependence on renal dialysis: Secondary | ICD-10-CM | POA: Diagnosis not present

## 2018-11-21 DIAGNOSIS — N2581 Secondary hyperparathyroidism of renal origin: Secondary | ICD-10-CM | POA: Diagnosis not present

## 2018-11-21 DIAGNOSIS — Z992 Dependence on renal dialysis: Secondary | ICD-10-CM | POA: Diagnosis not present

## 2018-11-21 DIAGNOSIS — D509 Iron deficiency anemia, unspecified: Secondary | ICD-10-CM | POA: Diagnosis not present

## 2018-11-21 DIAGNOSIS — N186 End stage renal disease: Secondary | ICD-10-CM | POA: Diagnosis not present

## 2018-11-22 DIAGNOSIS — H4051X3 Glaucoma secondary to other eye disorders, right eye, severe stage: Secondary | ICD-10-CM | POA: Diagnosis not present

## 2018-11-22 DIAGNOSIS — H211X2 Other vascular disorders of iris and ciliary body, left eye: Secondary | ICD-10-CM | POA: Diagnosis not present

## 2018-11-22 DIAGNOSIS — E113591 Type 2 diabetes mellitus with proliferative diabetic retinopathy without macular edema, right eye: Secondary | ICD-10-CM | POA: Diagnosis not present

## 2018-11-22 DIAGNOSIS — E113512 Type 2 diabetes mellitus with proliferative diabetic retinopathy with macular edema, left eye: Secondary | ICD-10-CM | POA: Diagnosis not present

## 2018-11-22 DIAGNOSIS — H4052X3 Glaucoma secondary to other eye disorders, left eye, severe stage: Secondary | ICD-10-CM | POA: Diagnosis not present

## 2018-11-23 DIAGNOSIS — N2581 Secondary hyperparathyroidism of renal origin: Secondary | ICD-10-CM | POA: Diagnosis not present

## 2018-11-23 DIAGNOSIS — Z992 Dependence on renal dialysis: Secondary | ICD-10-CM | POA: Diagnosis not present

## 2018-11-23 DIAGNOSIS — D509 Iron deficiency anemia, unspecified: Secondary | ICD-10-CM | POA: Diagnosis not present

## 2018-11-23 DIAGNOSIS — N186 End stage renal disease: Secondary | ICD-10-CM | POA: Diagnosis not present

## 2018-11-25 DIAGNOSIS — Z992 Dependence on renal dialysis: Secondary | ICD-10-CM | POA: Diagnosis not present

## 2018-11-25 DIAGNOSIS — N186 End stage renal disease: Secondary | ICD-10-CM | POA: Diagnosis not present

## 2018-11-25 DIAGNOSIS — D509 Iron deficiency anemia, unspecified: Secondary | ICD-10-CM | POA: Diagnosis not present

## 2018-11-25 DIAGNOSIS — N2581 Secondary hyperparathyroidism of renal origin: Secondary | ICD-10-CM | POA: Diagnosis not present

## 2018-11-28 DIAGNOSIS — Z992 Dependence on renal dialysis: Secondary | ICD-10-CM | POA: Diagnosis not present

## 2018-11-28 DIAGNOSIS — D509 Iron deficiency anemia, unspecified: Secondary | ICD-10-CM | POA: Diagnosis not present

## 2018-11-28 DIAGNOSIS — N186 End stage renal disease: Secondary | ICD-10-CM | POA: Diagnosis not present

## 2018-11-28 DIAGNOSIS — N2581 Secondary hyperparathyroidism of renal origin: Secondary | ICD-10-CM | POA: Diagnosis not present

## 2018-11-30 ENCOUNTER — Other Ambulatory Visit (HOSPITAL_COMMUNITY): Payer: Self-pay | Admitting: Nephrology

## 2018-11-30 DIAGNOSIS — D509 Iron deficiency anemia, unspecified: Secondary | ICD-10-CM | POA: Diagnosis not present

## 2018-11-30 DIAGNOSIS — N2581 Secondary hyperparathyroidism of renal origin: Secondary | ICD-10-CM | POA: Diagnosis not present

## 2018-11-30 DIAGNOSIS — Z992 Dependence on renal dialysis: Secondary | ICD-10-CM | POA: Diagnosis not present

## 2018-11-30 DIAGNOSIS — N186 End stage renal disease: Secondary | ICD-10-CM

## 2018-12-01 DIAGNOSIS — N186 End stage renal disease: Secondary | ICD-10-CM | POA: Diagnosis not present

## 2018-12-01 DIAGNOSIS — Z992 Dependence on renal dialysis: Secondary | ICD-10-CM | POA: Diagnosis not present

## 2018-12-02 DIAGNOSIS — N186 End stage renal disease: Secondary | ICD-10-CM | POA: Diagnosis not present

## 2018-12-02 DIAGNOSIS — Z992 Dependence on renal dialysis: Secondary | ICD-10-CM | POA: Diagnosis not present

## 2018-12-02 DIAGNOSIS — D509 Iron deficiency anemia, unspecified: Secondary | ICD-10-CM | POA: Diagnosis not present

## 2018-12-02 DIAGNOSIS — N2581 Secondary hyperparathyroidism of renal origin: Secondary | ICD-10-CM | POA: Diagnosis not present

## 2018-12-05 DIAGNOSIS — N2581 Secondary hyperparathyroidism of renal origin: Secondary | ICD-10-CM | POA: Diagnosis not present

## 2018-12-05 DIAGNOSIS — Z992 Dependence on renal dialysis: Secondary | ICD-10-CM | POA: Diagnosis not present

## 2018-12-05 DIAGNOSIS — D509 Iron deficiency anemia, unspecified: Secondary | ICD-10-CM | POA: Diagnosis not present

## 2018-12-05 DIAGNOSIS — N186 End stage renal disease: Secondary | ICD-10-CM | POA: Diagnosis not present

## 2018-12-06 ENCOUNTER — Encounter (HOSPITAL_COMMUNITY): Payer: Self-pay | Admitting: Student

## 2018-12-06 ENCOUNTER — Ambulatory Visit (HOSPITAL_COMMUNITY)
Admission: RE | Admit: 2018-12-06 | Discharge: 2018-12-06 | Disposition: A | Discharge status: Home / Self Care | Payer: Medicare Other | Source: Ambulatory Visit | Attending: Nephrology | Admitting: Nephrology

## 2018-12-06 ENCOUNTER — Other Ambulatory Visit (HOSPITAL_COMMUNITY): Payer: Medicare Other

## 2018-12-06 ENCOUNTER — Encounter (HOSPITAL_COMMUNITY): Payer: Self-pay

## 2018-12-06 ENCOUNTER — Other Ambulatory Visit: Payer: Self-pay

## 2018-12-06 DIAGNOSIS — N186 End stage renal disease: Secondary | ICD-10-CM | POA: Insufficient documentation

## 2018-12-06 DIAGNOSIS — Z452 Encounter for adjustment and management of vascular access device: Secondary | ICD-10-CM | POA: Insufficient documentation

## 2018-12-06 DIAGNOSIS — Z992 Dependence on renal dialysis: Secondary | ICD-10-CM | POA: Diagnosis not present

## 2018-12-06 HISTORY — PX: IR REMOVAL TUN CV CATH W/O FL: IMG2289

## 2018-12-06 MED ORDER — LIDOCAINE HCL 1 % IJ SOLN
INTRAMUSCULAR | Status: DC | PRN
  Administered 2018-12-06: 10 mL

## 2018-12-06 MED ORDER — LIDOCAINE HCL 1 % IJ SOLN
INTRAMUSCULAR | Status: AC
  Filled 2018-12-06: qty 20

## 2018-12-06 MED ORDER — CHLORHEXIDINE GLUCONATE 4 % EX LIQD
CUTANEOUS | Status: AC
  Filled 2018-12-06: qty 15

## 2018-12-06 NOTE — Procedures (Signed)
PROCEDURE SUMMARY:  Successful removal of tunneled HD catheter. No immediate complications.  EBL = 5 mL. Pressure dressing with Tegaderm applied to site. Patient tolerated well.    Claris Pong Louk PA-C 12/06/2018 8:27 AM

## 2018-12-07 DIAGNOSIS — N186 End stage renal disease: Secondary | ICD-10-CM | POA: Diagnosis not present

## 2018-12-07 DIAGNOSIS — D509 Iron deficiency anemia, unspecified: Secondary | ICD-10-CM | POA: Diagnosis not present

## 2018-12-07 DIAGNOSIS — Z992 Dependence on renal dialysis: Secondary | ICD-10-CM | POA: Diagnosis not present

## 2018-12-07 DIAGNOSIS — N2581 Secondary hyperparathyroidism of renal origin: Secondary | ICD-10-CM | POA: Diagnosis not present

## 2018-12-08 ENCOUNTER — Ambulatory Visit (HOSPITAL_COMMUNITY): Payer: Medicare Other

## 2018-12-09 DIAGNOSIS — N2581 Secondary hyperparathyroidism of renal origin: Secondary | ICD-10-CM | POA: Diagnosis not present

## 2018-12-09 DIAGNOSIS — N186 End stage renal disease: Secondary | ICD-10-CM | POA: Diagnosis not present

## 2018-12-09 DIAGNOSIS — Z992 Dependence on renal dialysis: Secondary | ICD-10-CM | POA: Diagnosis not present

## 2018-12-09 DIAGNOSIS — D509 Iron deficiency anemia, unspecified: Secondary | ICD-10-CM | POA: Diagnosis not present

## 2018-12-12 DIAGNOSIS — Z992 Dependence on renal dialysis: Secondary | ICD-10-CM | POA: Diagnosis not present

## 2018-12-12 DIAGNOSIS — D509 Iron deficiency anemia, unspecified: Secondary | ICD-10-CM | POA: Diagnosis not present

## 2018-12-12 DIAGNOSIS — N186 End stage renal disease: Secondary | ICD-10-CM | POA: Diagnosis not present

## 2018-12-12 DIAGNOSIS — N2581 Secondary hyperparathyroidism of renal origin: Secondary | ICD-10-CM | POA: Diagnosis not present

## 2018-12-14 DIAGNOSIS — N186 End stage renal disease: Secondary | ICD-10-CM | POA: Diagnosis not present

## 2018-12-14 DIAGNOSIS — Z992 Dependence on renal dialysis: Secondary | ICD-10-CM | POA: Diagnosis not present

## 2018-12-14 DIAGNOSIS — D509 Iron deficiency anemia, unspecified: Secondary | ICD-10-CM | POA: Diagnosis not present

## 2018-12-14 DIAGNOSIS — N2581 Secondary hyperparathyroidism of renal origin: Secondary | ICD-10-CM | POA: Diagnosis not present

## 2018-12-15 DIAGNOSIS — H26492 Other secondary cataract, left eye: Secondary | ICD-10-CM | POA: Diagnosis not present

## 2018-12-15 DIAGNOSIS — E113512 Type 2 diabetes mellitus with proliferative diabetic retinopathy with macular edema, left eye: Secondary | ICD-10-CM | POA: Diagnosis not present

## 2018-12-16 DIAGNOSIS — D509 Iron deficiency anemia, unspecified: Secondary | ICD-10-CM | POA: Diagnosis not present

## 2018-12-16 DIAGNOSIS — Z992 Dependence on renal dialysis: Secondary | ICD-10-CM | POA: Diagnosis not present

## 2018-12-16 DIAGNOSIS — N186 End stage renal disease: Secondary | ICD-10-CM | POA: Diagnosis not present

## 2018-12-16 DIAGNOSIS — N2581 Secondary hyperparathyroidism of renal origin: Secondary | ICD-10-CM | POA: Diagnosis not present

## 2018-12-19 DIAGNOSIS — Z992 Dependence on renal dialysis: Secondary | ICD-10-CM | POA: Diagnosis not present

## 2018-12-19 DIAGNOSIS — N186 End stage renal disease: Secondary | ICD-10-CM | POA: Diagnosis not present

## 2018-12-19 DIAGNOSIS — D509 Iron deficiency anemia, unspecified: Secondary | ICD-10-CM | POA: Diagnosis not present

## 2018-12-19 DIAGNOSIS — N2581 Secondary hyperparathyroidism of renal origin: Secondary | ICD-10-CM | POA: Diagnosis not present

## 2018-12-21 DIAGNOSIS — N186 End stage renal disease: Secondary | ICD-10-CM | POA: Diagnosis not present

## 2018-12-21 DIAGNOSIS — D509 Iron deficiency anemia, unspecified: Secondary | ICD-10-CM | POA: Diagnosis not present

## 2018-12-21 DIAGNOSIS — N2581 Secondary hyperparathyroidism of renal origin: Secondary | ICD-10-CM | POA: Diagnosis not present

## 2018-12-21 DIAGNOSIS — Z992 Dependence on renal dialysis: Secondary | ICD-10-CM | POA: Diagnosis not present

## 2018-12-23 DIAGNOSIS — Z992 Dependence on renal dialysis: Secondary | ICD-10-CM | POA: Diagnosis not present

## 2018-12-23 DIAGNOSIS — N2581 Secondary hyperparathyroidism of renal origin: Secondary | ICD-10-CM | POA: Diagnosis not present

## 2018-12-23 DIAGNOSIS — N186 End stage renal disease: Secondary | ICD-10-CM | POA: Diagnosis not present

## 2018-12-23 DIAGNOSIS — D509 Iron deficiency anemia, unspecified: Secondary | ICD-10-CM | POA: Diagnosis not present

## 2018-12-26 DIAGNOSIS — Z992 Dependence on renal dialysis: Secondary | ICD-10-CM | POA: Diagnosis not present

## 2018-12-26 DIAGNOSIS — D509 Iron deficiency anemia, unspecified: Secondary | ICD-10-CM | POA: Diagnosis not present

## 2018-12-26 DIAGNOSIS — N2581 Secondary hyperparathyroidism of renal origin: Secondary | ICD-10-CM | POA: Diagnosis not present

## 2018-12-26 DIAGNOSIS — N186 End stage renal disease: Secondary | ICD-10-CM | POA: Diagnosis not present

## 2018-12-28 DIAGNOSIS — D509 Iron deficiency anemia, unspecified: Secondary | ICD-10-CM | POA: Diagnosis not present

## 2018-12-28 DIAGNOSIS — N186 End stage renal disease: Secondary | ICD-10-CM | POA: Diagnosis not present

## 2018-12-28 DIAGNOSIS — Z992 Dependence on renal dialysis: Secondary | ICD-10-CM | POA: Diagnosis not present

## 2018-12-28 DIAGNOSIS — N2581 Secondary hyperparathyroidism of renal origin: Secondary | ICD-10-CM | POA: Diagnosis not present

## 2018-12-30 DIAGNOSIS — Z992 Dependence on renal dialysis: Secondary | ICD-10-CM | POA: Diagnosis not present

## 2018-12-30 DIAGNOSIS — D509 Iron deficiency anemia, unspecified: Secondary | ICD-10-CM | POA: Diagnosis not present

## 2018-12-30 DIAGNOSIS — N186 End stage renal disease: Secondary | ICD-10-CM | POA: Diagnosis not present

## 2018-12-30 DIAGNOSIS — N2581 Secondary hyperparathyroidism of renal origin: Secondary | ICD-10-CM | POA: Diagnosis not present

## 2019-01-01 DIAGNOSIS — N186 End stage renal disease: Secondary | ICD-10-CM | POA: Diagnosis not present

## 2019-01-01 DIAGNOSIS — Z992 Dependence on renal dialysis: Secondary | ICD-10-CM | POA: Diagnosis not present

## 2019-01-02 DIAGNOSIS — D509 Iron deficiency anemia, unspecified: Secondary | ICD-10-CM | POA: Diagnosis not present

## 2019-01-02 DIAGNOSIS — Z992 Dependence on renal dialysis: Secondary | ICD-10-CM | POA: Diagnosis not present

## 2019-01-02 DIAGNOSIS — N186 End stage renal disease: Secondary | ICD-10-CM | POA: Diagnosis not present

## 2019-01-02 DIAGNOSIS — N2581 Secondary hyperparathyroidism of renal origin: Secondary | ICD-10-CM | POA: Diagnosis not present

## 2019-01-04 DIAGNOSIS — N186 End stage renal disease: Secondary | ICD-10-CM | POA: Diagnosis not present

## 2019-01-04 DIAGNOSIS — Z992 Dependence on renal dialysis: Secondary | ICD-10-CM | POA: Diagnosis not present

## 2019-01-04 DIAGNOSIS — N2581 Secondary hyperparathyroidism of renal origin: Secondary | ICD-10-CM | POA: Diagnosis not present

## 2019-01-04 DIAGNOSIS — D509 Iron deficiency anemia, unspecified: Secondary | ICD-10-CM | POA: Diagnosis not present

## 2019-01-06 DIAGNOSIS — N2581 Secondary hyperparathyroidism of renal origin: Secondary | ICD-10-CM | POA: Diagnosis not present

## 2019-01-06 DIAGNOSIS — D509 Iron deficiency anemia, unspecified: Secondary | ICD-10-CM | POA: Diagnosis not present

## 2019-01-06 DIAGNOSIS — N186 End stage renal disease: Secondary | ICD-10-CM | POA: Diagnosis not present

## 2019-01-06 DIAGNOSIS — Z992 Dependence on renal dialysis: Secondary | ICD-10-CM | POA: Diagnosis not present

## 2019-01-09 DIAGNOSIS — Z992 Dependence on renal dialysis: Secondary | ICD-10-CM | POA: Diagnosis not present

## 2019-01-09 DIAGNOSIS — N186 End stage renal disease: Secondary | ICD-10-CM | POA: Diagnosis not present

## 2019-01-09 DIAGNOSIS — N2581 Secondary hyperparathyroidism of renal origin: Secondary | ICD-10-CM | POA: Diagnosis not present

## 2019-01-09 DIAGNOSIS — D509 Iron deficiency anemia, unspecified: Secondary | ICD-10-CM | POA: Diagnosis not present

## 2019-01-11 DIAGNOSIS — D509 Iron deficiency anemia, unspecified: Secondary | ICD-10-CM | POA: Diagnosis not present

## 2019-01-11 DIAGNOSIS — Z992 Dependence on renal dialysis: Secondary | ICD-10-CM | POA: Diagnosis not present

## 2019-01-11 DIAGNOSIS — N186 End stage renal disease: Secondary | ICD-10-CM | POA: Diagnosis not present

## 2019-01-11 DIAGNOSIS — N2581 Secondary hyperparathyroidism of renal origin: Secondary | ICD-10-CM | POA: Diagnosis not present

## 2019-01-13 DIAGNOSIS — N2581 Secondary hyperparathyroidism of renal origin: Secondary | ICD-10-CM | POA: Diagnosis not present

## 2019-01-13 DIAGNOSIS — N186 End stage renal disease: Secondary | ICD-10-CM | POA: Diagnosis not present

## 2019-01-13 DIAGNOSIS — D509 Iron deficiency anemia, unspecified: Secondary | ICD-10-CM | POA: Diagnosis not present

## 2019-01-13 DIAGNOSIS — Z992 Dependence on renal dialysis: Secondary | ICD-10-CM | POA: Diagnosis not present

## 2019-01-16 DIAGNOSIS — Z992 Dependence on renal dialysis: Secondary | ICD-10-CM | POA: Diagnosis not present

## 2019-01-16 DIAGNOSIS — N186 End stage renal disease: Secondary | ICD-10-CM | POA: Diagnosis not present

## 2019-01-16 DIAGNOSIS — D509 Iron deficiency anemia, unspecified: Secondary | ICD-10-CM | POA: Diagnosis not present

## 2019-01-16 DIAGNOSIS — N2581 Secondary hyperparathyroidism of renal origin: Secondary | ICD-10-CM | POA: Diagnosis not present

## 2019-01-18 DIAGNOSIS — N2581 Secondary hyperparathyroidism of renal origin: Secondary | ICD-10-CM | POA: Diagnosis not present

## 2019-01-18 DIAGNOSIS — Z992 Dependence on renal dialysis: Secondary | ICD-10-CM | POA: Diagnosis not present

## 2019-01-18 DIAGNOSIS — N186 End stage renal disease: Secondary | ICD-10-CM | POA: Diagnosis not present

## 2019-01-18 DIAGNOSIS — D509 Iron deficiency anemia, unspecified: Secondary | ICD-10-CM | POA: Diagnosis not present

## 2019-01-20 DIAGNOSIS — N186 End stage renal disease: Secondary | ICD-10-CM | POA: Diagnosis not present

## 2019-01-20 DIAGNOSIS — D509 Iron deficiency anemia, unspecified: Secondary | ICD-10-CM | POA: Diagnosis not present

## 2019-01-20 DIAGNOSIS — Z992 Dependence on renal dialysis: Secondary | ICD-10-CM | POA: Diagnosis not present

## 2019-01-20 DIAGNOSIS — N2581 Secondary hyperparathyroidism of renal origin: Secondary | ICD-10-CM | POA: Diagnosis not present

## 2019-01-23 DIAGNOSIS — Z794 Long term (current) use of insulin: Secondary | ICD-10-CM | POA: Diagnosis not present

## 2019-01-23 DIAGNOSIS — E119 Type 2 diabetes mellitus without complications: Secondary | ICD-10-CM | POA: Diagnosis not present

## 2019-01-23 DIAGNOSIS — N2581 Secondary hyperparathyroidism of renal origin: Secondary | ICD-10-CM | POA: Diagnosis not present

## 2019-01-23 DIAGNOSIS — D509 Iron deficiency anemia, unspecified: Secondary | ICD-10-CM | POA: Diagnosis not present

## 2019-01-23 DIAGNOSIS — Z992 Dependence on renal dialysis: Secondary | ICD-10-CM | POA: Diagnosis not present

## 2019-01-23 DIAGNOSIS — N186 End stage renal disease: Secondary | ICD-10-CM | POA: Diagnosis not present

## 2019-01-25 DIAGNOSIS — D509 Iron deficiency anemia, unspecified: Secondary | ICD-10-CM | POA: Diagnosis not present

## 2019-01-25 DIAGNOSIS — N2581 Secondary hyperparathyroidism of renal origin: Secondary | ICD-10-CM | POA: Diagnosis not present

## 2019-01-25 DIAGNOSIS — Z992 Dependence on renal dialysis: Secondary | ICD-10-CM | POA: Diagnosis not present

## 2019-01-25 DIAGNOSIS — N186 End stage renal disease: Secondary | ICD-10-CM | POA: Diagnosis not present

## 2019-01-27 DIAGNOSIS — N186 End stage renal disease: Secondary | ICD-10-CM | POA: Diagnosis not present

## 2019-01-27 DIAGNOSIS — D509 Iron deficiency anemia, unspecified: Secondary | ICD-10-CM | POA: Diagnosis not present

## 2019-01-27 DIAGNOSIS — Z992 Dependence on renal dialysis: Secondary | ICD-10-CM | POA: Diagnosis not present

## 2019-01-27 DIAGNOSIS — N2581 Secondary hyperparathyroidism of renal origin: Secondary | ICD-10-CM | POA: Diagnosis not present

## 2019-01-30 DIAGNOSIS — N186 End stage renal disease: Secondary | ICD-10-CM | POA: Diagnosis not present

## 2019-01-30 DIAGNOSIS — Z992 Dependence on renal dialysis: Secondary | ICD-10-CM | POA: Diagnosis not present

## 2019-01-30 DIAGNOSIS — N2581 Secondary hyperparathyroidism of renal origin: Secondary | ICD-10-CM | POA: Diagnosis not present

## 2019-01-30 DIAGNOSIS — D509 Iron deficiency anemia, unspecified: Secondary | ICD-10-CM | POA: Diagnosis not present

## 2019-01-31 DIAGNOSIS — Z992 Dependence on renal dialysis: Secondary | ICD-10-CM | POA: Diagnosis not present

## 2019-01-31 DIAGNOSIS — N186 End stage renal disease: Secondary | ICD-10-CM | POA: Diagnosis not present

## 2019-02-01 DIAGNOSIS — N186 End stage renal disease: Secondary | ICD-10-CM | POA: Diagnosis not present

## 2019-02-01 DIAGNOSIS — Z992 Dependence on renal dialysis: Secondary | ICD-10-CM | POA: Diagnosis not present

## 2019-02-01 DIAGNOSIS — D509 Iron deficiency anemia, unspecified: Secondary | ICD-10-CM | POA: Diagnosis not present

## 2019-02-01 DIAGNOSIS — N2581 Secondary hyperparathyroidism of renal origin: Secondary | ICD-10-CM | POA: Diagnosis not present

## 2019-02-03 DIAGNOSIS — Z992 Dependence on renal dialysis: Secondary | ICD-10-CM | POA: Diagnosis not present

## 2019-02-03 DIAGNOSIS — D509 Iron deficiency anemia, unspecified: Secondary | ICD-10-CM | POA: Diagnosis not present

## 2019-02-03 DIAGNOSIS — N2581 Secondary hyperparathyroidism of renal origin: Secondary | ICD-10-CM | POA: Diagnosis not present

## 2019-02-03 DIAGNOSIS — N186 End stage renal disease: Secondary | ICD-10-CM | POA: Diagnosis not present

## 2019-02-05 DIAGNOSIS — N2581 Secondary hyperparathyroidism of renal origin: Secondary | ICD-10-CM | POA: Diagnosis not present

## 2019-02-05 DIAGNOSIS — D509 Iron deficiency anemia, unspecified: Secondary | ICD-10-CM | POA: Diagnosis not present

## 2019-02-05 DIAGNOSIS — Z992 Dependence on renal dialysis: Secondary | ICD-10-CM | POA: Diagnosis not present

## 2019-02-05 DIAGNOSIS — N186 End stage renal disease: Secondary | ICD-10-CM | POA: Diagnosis not present

## 2019-02-06 DIAGNOSIS — N2581 Secondary hyperparathyroidism of renal origin: Secondary | ICD-10-CM | POA: Diagnosis not present

## 2019-02-06 DIAGNOSIS — N186 End stage renal disease: Secondary | ICD-10-CM | POA: Diagnosis not present

## 2019-02-06 DIAGNOSIS — Z992 Dependence on renal dialysis: Secondary | ICD-10-CM | POA: Diagnosis not present

## 2019-02-06 DIAGNOSIS — D509 Iron deficiency anemia, unspecified: Secondary | ICD-10-CM | POA: Diagnosis not present

## 2019-02-08 DIAGNOSIS — N2581 Secondary hyperparathyroidism of renal origin: Secondary | ICD-10-CM | POA: Diagnosis not present

## 2019-02-08 DIAGNOSIS — Z992 Dependence on renal dialysis: Secondary | ICD-10-CM | POA: Diagnosis not present

## 2019-02-08 DIAGNOSIS — N186 End stage renal disease: Secondary | ICD-10-CM | POA: Diagnosis not present

## 2019-02-08 DIAGNOSIS — D509 Iron deficiency anemia, unspecified: Secondary | ICD-10-CM | POA: Diagnosis not present

## 2019-02-10 DIAGNOSIS — Z992 Dependence on renal dialysis: Secondary | ICD-10-CM | POA: Diagnosis not present

## 2019-02-10 DIAGNOSIS — N186 End stage renal disease: Secondary | ICD-10-CM | POA: Diagnosis not present

## 2019-02-10 DIAGNOSIS — D509 Iron deficiency anemia, unspecified: Secondary | ICD-10-CM | POA: Diagnosis not present

## 2019-02-10 DIAGNOSIS — N2581 Secondary hyperparathyroidism of renal origin: Secondary | ICD-10-CM | POA: Diagnosis not present

## 2019-02-13 DIAGNOSIS — N186 End stage renal disease: Secondary | ICD-10-CM | POA: Diagnosis not present

## 2019-02-13 DIAGNOSIS — Z992 Dependence on renal dialysis: Secondary | ICD-10-CM | POA: Diagnosis not present

## 2019-02-13 DIAGNOSIS — D509 Iron deficiency anemia, unspecified: Secondary | ICD-10-CM | POA: Diagnosis not present

## 2019-02-13 DIAGNOSIS — N2581 Secondary hyperparathyroidism of renal origin: Secondary | ICD-10-CM | POA: Diagnosis not present

## 2019-02-15 DIAGNOSIS — N186 End stage renal disease: Secondary | ICD-10-CM | POA: Diagnosis not present

## 2019-02-15 DIAGNOSIS — Z992 Dependence on renal dialysis: Secondary | ICD-10-CM | POA: Diagnosis not present

## 2019-02-15 DIAGNOSIS — D509 Iron deficiency anemia, unspecified: Secondary | ICD-10-CM | POA: Diagnosis not present

## 2019-02-15 DIAGNOSIS — N2581 Secondary hyperparathyroidism of renal origin: Secondary | ICD-10-CM | POA: Diagnosis not present

## 2019-02-16 DIAGNOSIS — E1122 Type 2 diabetes mellitus with diabetic chronic kidney disease: Secondary | ICD-10-CM | POA: Diagnosis not present

## 2019-02-16 DIAGNOSIS — Z992 Dependence on renal dialysis: Secondary | ICD-10-CM | POA: Diagnosis not present

## 2019-02-16 DIAGNOSIS — Z794 Long term (current) use of insulin: Secondary | ICD-10-CM | POA: Diagnosis not present

## 2019-02-16 DIAGNOSIS — Z7982 Long term (current) use of aspirin: Secondary | ICD-10-CM | POA: Diagnosis not present

## 2019-02-16 DIAGNOSIS — R109 Unspecified abdominal pain: Secondary | ICD-10-CM | POA: Diagnosis not present

## 2019-02-16 DIAGNOSIS — Z880 Allergy status to penicillin: Secondary | ICD-10-CM | POA: Diagnosis not present

## 2019-02-16 DIAGNOSIS — R112 Nausea with vomiting, unspecified: Secondary | ICD-10-CM | POA: Diagnosis not present

## 2019-02-16 DIAGNOSIS — Z8249 Family history of ischemic heart disease and other diseases of the circulatory system: Secondary | ICD-10-CM | POA: Diagnosis not present

## 2019-02-16 DIAGNOSIS — I509 Heart failure, unspecified: Secondary | ICD-10-CM | POA: Diagnosis not present

## 2019-02-16 DIAGNOSIS — Z833 Family history of diabetes mellitus: Secondary | ICD-10-CM | POA: Diagnosis not present

## 2019-02-16 DIAGNOSIS — R197 Diarrhea, unspecified: Secondary | ICD-10-CM | POA: Diagnosis not present

## 2019-02-16 DIAGNOSIS — N186 End stage renal disease: Secondary | ICD-10-CM | POA: Diagnosis not present

## 2019-02-16 DIAGNOSIS — I132 Hypertensive heart and chronic kidney disease with heart failure and with stage 5 chronic kidney disease, or end stage renal disease: Secondary | ICD-10-CM | POA: Diagnosis not present

## 2019-02-16 DIAGNOSIS — R079 Chest pain, unspecified: Secondary | ICD-10-CM | POA: Diagnosis not present

## 2019-02-16 DIAGNOSIS — Z91030 Bee allergy status: Secondary | ICD-10-CM | POA: Diagnosis not present

## 2019-02-16 DIAGNOSIS — R1110 Vomiting, unspecified: Secondary | ICD-10-CM | POA: Diagnosis not present

## 2019-02-16 DIAGNOSIS — Z79899 Other long term (current) drug therapy: Secondary | ICD-10-CM | POA: Diagnosis not present

## 2019-02-17 DIAGNOSIS — N2581 Secondary hyperparathyroidism of renal origin: Secondary | ICD-10-CM | POA: Diagnosis not present

## 2019-02-17 DIAGNOSIS — Z992 Dependence on renal dialysis: Secondary | ICD-10-CM | POA: Diagnosis not present

## 2019-02-17 DIAGNOSIS — D509 Iron deficiency anemia, unspecified: Secondary | ICD-10-CM | POA: Diagnosis not present

## 2019-02-17 DIAGNOSIS — N186 End stage renal disease: Secondary | ICD-10-CM | POA: Diagnosis not present

## 2019-02-20 DIAGNOSIS — Z992 Dependence on renal dialysis: Secondary | ICD-10-CM | POA: Diagnosis not present

## 2019-02-20 DIAGNOSIS — N186 End stage renal disease: Secondary | ICD-10-CM | POA: Diagnosis not present

## 2019-02-20 DIAGNOSIS — N2581 Secondary hyperparathyroidism of renal origin: Secondary | ICD-10-CM | POA: Diagnosis not present

## 2019-02-20 DIAGNOSIS — D509 Iron deficiency anemia, unspecified: Secondary | ICD-10-CM | POA: Diagnosis not present

## 2019-02-22 DIAGNOSIS — Z992 Dependence on renal dialysis: Secondary | ICD-10-CM | POA: Diagnosis not present

## 2019-02-22 DIAGNOSIS — D509 Iron deficiency anemia, unspecified: Secondary | ICD-10-CM | POA: Diagnosis not present

## 2019-02-22 DIAGNOSIS — N186 End stage renal disease: Secondary | ICD-10-CM | POA: Diagnosis not present

## 2019-02-22 DIAGNOSIS — N2581 Secondary hyperparathyroidism of renal origin: Secondary | ICD-10-CM | POA: Diagnosis not present

## 2019-02-24 DIAGNOSIS — N186 End stage renal disease: Secondary | ICD-10-CM | POA: Diagnosis not present

## 2019-02-24 DIAGNOSIS — Z992 Dependence on renal dialysis: Secondary | ICD-10-CM | POA: Diagnosis not present

## 2019-02-24 DIAGNOSIS — D509 Iron deficiency anemia, unspecified: Secondary | ICD-10-CM | POA: Diagnosis not present

## 2019-02-24 DIAGNOSIS — N2581 Secondary hyperparathyroidism of renal origin: Secondary | ICD-10-CM | POA: Diagnosis not present

## 2019-02-27 ENCOUNTER — Other Ambulatory Visit (HOSPITAL_COMMUNITY): Payer: Self-pay | Admitting: Nephrology

## 2019-02-27 ENCOUNTER — Other Ambulatory Visit: Payer: Self-pay | Admitting: Student

## 2019-02-27 ENCOUNTER — Telehealth: Payer: Self-pay | Admitting: Student

## 2019-02-27 DIAGNOSIS — N2581 Secondary hyperparathyroidism of renal origin: Secondary | ICD-10-CM | POA: Diagnosis not present

## 2019-02-27 DIAGNOSIS — N186 End stage renal disease: Secondary | ICD-10-CM | POA: Diagnosis not present

## 2019-02-27 DIAGNOSIS — D509 Iron deficiency anemia, unspecified: Secondary | ICD-10-CM | POA: Diagnosis not present

## 2019-02-27 DIAGNOSIS — Z992 Dependence on renal dialysis: Secondary | ICD-10-CM | POA: Diagnosis not present

## 2019-02-27 NOTE — Telephone Encounter (Signed)
Premedication called in for patient for declotting procedure in IR 7/28.  Prednisone 50 mg PO 13 hr, 7 hrs, and 1 hr prior to procedure, benadryl 50 mg PO 1 hr prior to procedure.  Spoke with patient, instructions given. Verbalizes understanding.  Knows to arrive to short stay at 1230 tomorrow and to have a ride home if undergoes intervention.   Brynda Greathouse, MS RD PA-C

## 2019-02-28 ENCOUNTER — Other Ambulatory Visit (HOSPITAL_COMMUNITY): Payer: Self-pay | Admitting: Nephrology

## 2019-02-28 ENCOUNTER — Ambulatory Visit (HOSPITAL_COMMUNITY)
Admission: RE | Admit: 2019-02-28 | Discharge: 2019-02-28 | Disposition: A | Discharge status: Home / Self Care | Payer: Medicare Other | Source: Ambulatory Visit | Attending: Nephrology | Admitting: Nephrology

## 2019-02-28 ENCOUNTER — Encounter (HOSPITAL_COMMUNITY): Payer: Self-pay | Admitting: Diagnostic Radiology

## 2019-02-28 ENCOUNTER — Other Ambulatory Visit: Payer: Self-pay

## 2019-02-28 DIAGNOSIS — Z794 Long term (current) use of insulin: Secondary | ICD-10-CM | POA: Diagnosis not present

## 2019-02-28 DIAGNOSIS — M1990 Unspecified osteoarthritis, unspecified site: Secondary | ICD-10-CM | POA: Diagnosis not present

## 2019-02-28 DIAGNOSIS — Z7902 Long term (current) use of antithrombotics/antiplatelets: Secondary | ICD-10-CM | POA: Insufficient documentation

## 2019-02-28 DIAGNOSIS — E1122 Type 2 diabetes mellitus with diabetic chronic kidney disease: Secondary | ICD-10-CM | POA: Insufficient documentation

## 2019-02-28 DIAGNOSIS — I120 Hypertensive chronic kidney disease with stage 5 chronic kidney disease or end stage renal disease: Secondary | ICD-10-CM | POA: Insufficient documentation

## 2019-02-28 DIAGNOSIS — Z8673 Personal history of transient ischemic attack (TIA), and cerebral infarction without residual deficits: Secondary | ICD-10-CM | POA: Insufficient documentation

## 2019-02-28 DIAGNOSIS — Y841 Kidney dialysis as the cause of abnormal reaction of the patient, or of later complication, without mention of misadventure at the time of the procedure: Secondary | ICD-10-CM | POA: Insufficient documentation

## 2019-02-28 DIAGNOSIS — K219 Gastro-esophageal reflux disease without esophagitis: Secondary | ICD-10-CM | POA: Diagnosis not present

## 2019-02-28 DIAGNOSIS — Z992 Dependence on renal dialysis: Secondary | ICD-10-CM | POA: Insufficient documentation

## 2019-02-28 DIAGNOSIS — N186 End stage renal disease: Secondary | ICD-10-CM

## 2019-02-28 DIAGNOSIS — H409 Unspecified glaucoma: Secondary | ICD-10-CM | POA: Diagnosis not present

## 2019-02-28 DIAGNOSIS — F419 Anxiety disorder, unspecified: Secondary | ICD-10-CM | POA: Diagnosis not present

## 2019-02-28 DIAGNOSIS — D631 Anemia in chronic kidney disease: Secondary | ICD-10-CM | POA: Insufficient documentation

## 2019-02-28 DIAGNOSIS — F329 Major depressive disorder, single episode, unspecified: Secondary | ICD-10-CM | POA: Diagnosis not present

## 2019-02-28 DIAGNOSIS — I428 Other cardiomyopathies: Secondary | ICD-10-CM | POA: Insufficient documentation

## 2019-02-28 DIAGNOSIS — Z79899 Other long term (current) drug therapy: Secondary | ICD-10-CM | POA: Diagnosis not present

## 2019-02-28 DIAGNOSIS — T82868A Thrombosis of vascular prosthetic devices, implants and grafts, initial encounter: Secondary | ICD-10-CM | POA: Diagnosis not present

## 2019-02-28 HISTORY — PX: IR US GUIDE VASC ACCESS RIGHT: IMG2390

## 2019-02-28 HISTORY — PX: IR THROMBECTOMY AV FISTULA W/THROMBOLYSIS INC/SHUNT/IMG RIGHT: IMG6118

## 2019-02-28 LAB — GLUCOSE, CAPILLARY: Glucose-Capillary: 159 mg/dL — ABNORMAL HIGH (ref 70–99)

## 2019-02-28 LAB — BASIC METABOLIC PANEL
Anion gap: 18 — ABNORMAL HIGH (ref 5–15)
BUN: 73 mg/dL — ABNORMAL HIGH (ref 8–23)
CO2: 18 mmol/L — ABNORMAL LOW (ref 22–32)
Calcium: 8.1 mg/dL — ABNORMAL LOW (ref 8.9–10.3)
Chloride: 98 mmol/L (ref 98–111)
Creatinine, Ser: 15.88 mg/dL — ABNORMAL HIGH (ref 0.61–1.24)
GFR calc Af Amer: 3 mL/min — ABNORMAL LOW (ref >60)
GFR calc non Af Amer: 3 mL/min — ABNORMAL LOW (ref >60)
Glucose, Bld: 169 mg/dL — ABNORMAL HIGH (ref 70–99)
Potassium: 4.7 mmol/L (ref 3.5–5.1)
Sodium: 134 mmol/L — ABNORMAL LOW (ref 135–145)

## 2019-02-28 LAB — CBC
HCT: 50.1 % (ref 39.0–52.0)
Hemoglobin: 15.6 g/dL (ref 13.0–17.0)
MCH: 30.6 pg (ref 26.0–34.0)
MCHC: 31.1 g/dL (ref 30.0–36.0)
MCV: 98.2 fL (ref 80.0–100.0)
Platelets: 184 10*3/uL (ref 150–400)
RBC: 5.1 MIL/uL (ref 4.22–5.81)
RDW: 13.3 % (ref 11.5–15.5)
WBC: 5.5 10*3/uL (ref 4.0–10.5)
nRBC: 0 % (ref 0.0–0.2)

## 2019-02-28 LAB — PROTIME-INR
INR: 1.1 (ref 0.8–1.2)
Prothrombin Time: 14.5 seconds (ref 11.4–15.2)

## 2019-02-28 MED ORDER — ALTEPLASE 2 MG IJ SOLR
INTRAMUSCULAR | Status: AC
  Filled 2019-02-28: qty 2

## 2019-02-28 MED ORDER — LIDOCAINE HCL (PF) 1 % IJ SOLN
INTRAMUSCULAR | Status: AC | PRN
  Administered 2019-02-28: 10 mL

## 2019-02-28 MED ORDER — FENTANYL CITRATE (PF) 100 MCG/2ML IJ SOLN
INTRAMUSCULAR | Status: AC
  Filled 2019-02-28: qty 2

## 2019-02-28 MED ORDER — LIDOCAINE HCL 1 % IJ SOLN
INTRAMUSCULAR | Status: AC
  Filled 2019-02-28: qty 20

## 2019-02-28 MED ORDER — DIPHENHYDRAMINE HCL 50 MG/ML IJ SOLN
50.0000 mg | Freq: Once | INTRAMUSCULAR | Status: AC
  Administered 2019-02-28: 50 mg via INTRAVENOUS

## 2019-02-28 MED ORDER — HEPARIN SODIUM (PORCINE) 1000 UNIT/ML IJ SOLN
INTRAMUSCULAR | Status: AC
  Filled 2019-02-28: qty 1

## 2019-02-28 MED ORDER — HEPARIN SODIUM (PORCINE) 1000 UNIT/ML IJ SOLN
INTRAMUSCULAR | Status: AC | PRN
  Administered 2019-02-28: 3000 [IU] via INTRAVENOUS

## 2019-02-28 MED ORDER — DIPHENHYDRAMINE HCL 50 MG/ML IJ SOLN
INTRAMUSCULAR | Status: AC
  Filled 2019-02-28: qty 1

## 2019-02-28 MED ORDER — ALTEPLASE 2 MG IJ SOLR
INTRAMUSCULAR | Status: AC | PRN
  Administered 2019-02-28: 2 mg

## 2019-02-28 MED ORDER — IOHEXOL 300 MG/ML  SOLN: 80.0000 mL | Freq: Once | INTRAMUSCULAR | Status: DC | PRN

## 2019-02-28 MED ORDER — MIDAZOLAM HCL 2 MG/2ML IJ SOLN
INTRAMUSCULAR | Status: AC
  Filled 2019-02-28: qty 2

## 2019-02-28 MED ORDER — SODIUM CHLORIDE 0.9 % IV SOLN: INTRAVENOUS | Status: DC

## 2019-02-28 NOTE — Procedures (Signed)
Interventional Radiology Procedure:   Indications: Thrombosed right thigh AV graft   Procedure: Right thigh AV graft declot  Findings: Successful declot.  Balloon angioplasty with 6 mm balloon.  Hematoma at one of the access sites.     Complications: None     EBL: less than 50  ml  Plan: Bedrest, observe for 2 hours prior to discharge to exclude bleeding at access sites.    Rhayne Chatwin R. Anselm Pancoast, MD  Pager: [phone]

## 2019-02-28 NOTE — Progress Notes (Signed)
RN noticed oozing from leg graft site on assessment.  RN held pressure x 5 minutes to stop oozing.  IR tech Velna Hatchet came to bedside to check site.  Dr. Anselm Pancoast made aware

## 2019-02-28 NOTE — Progress Notes (Signed)
Alli, PA to bedside to check site

## 2019-02-28 NOTE — H&P (Signed)
Chief Complaint: Patient was seen in consultation today for clotted AV graft  Referring Physician(s): Coladonato,Joseph  Supervising Physician: Luanne Bras  Patient Status: [facility] - Out-pt  History of Present Illness: Jacob [name] is a 68 y.o. male with past medical history of anxiety, HTN and ESRD on HD via dialysis R thigh AV graft.  Patient has had multiple surgeries for fistula and graft placement in the past.  His current R thigh graft was placed 10/20/18.  He has also had several R and L IJ tunneled HD catheters, however last attempt to access by vascular surgery was unsuccessful. His most recent tunneled HD catheter was placed in the right thigh. His thigh graft was last used on Friday.  He presented for dialysis yesterday but was unable to complete due to clotted access.  He presents to IR today for intervention.   Patient has been NPO today.  He does have a contrast allergy and did take his prednisone overnight.  He did not take benadryl.  He is not currently on blood thinners.   Past Medical History:  Diagnosis Date  . Anemia in chronic kidney disease(285.21)   . Anxiety   . Arthritis   . Depression   . ESRD (end stage renal disease) on dialysis [facility])    "MWF; [facility] [place]" (02/18/2017)  . Essential hypertension   . GERD (gastroesophageal reflux disease)   . Glaucoma   . History of blood transfusion   . History of stroke 2016  . Insomnia   . Nonischemic cardiomyopathy ([place])   . Type 2 diabetes mellitus ([place])     Past Surgical History:  Procedure Laterality Date  . A/V FISTULAGRAM Right 10/06/2016   Procedure: A/V Fistulagram;  Surgeon: [name], MD;  Location: [place] CV LAB;  Service: Cardiovascular;  Laterality: Right;  . A/V FISTULAGRAM Right 04/27/2017   Procedure: A/V Fistulagram;  Surgeon: [name], MD;  Location: [place] CV LAB;  Service: Cardiovascular;  Laterality: Right;  . A/V FISTULAGRAM Left 10/20/2018   Procedure: A/V  FISTULAGRAM;  Surgeon: Algernon Huxley, MD;  Location: [place] CV LAB;  Service: Cardiovascular;  Laterality: Left;  . A/V SHUNTOGRAM Right 11/17/2017   Procedure: A/V SHUNTOGRAM;  Surgeon: Waynetta Sandy, MD;  Location: [place] CV LAB;  Service: Cardiovascular;  Laterality: Right;  . ABDOMINAL AORTOGRAM N/A 12/30/2016   Procedure: Abdominal Aortogram;  Surgeon: Waynetta Sandy, MD;  Location: [place] CV LAB;  Service: Cardiovascular;  Laterality: N/A;  . ABDOMINAL AORTOGRAM N/A 04/08/2017   Procedure: ABDOMINAL AORTOGRAM;  Surgeon: Conrad Cross, MD;  Location: [place] CV LAB;  Service: Cardiovascular;  Laterality: N/A;  . ABDOMINAL AORTOGRAM W/LOWER EXTREMITY N/A 02/23/2017   Procedure: Abdominal Aortogram w/Lower Extremity;  Surgeon: [name], MD;  Location: [place] CV LAB;  Service: Cardiovascular;  Laterality: N/A;  Rt. leg  . AMPUTATION Right 02/28/2017   Procedure: RIGHT BELOW KNEE AMPUTATION;  Surgeon: [name], MD;  Location: [place];  Service: Vascular;  Laterality: Right;  . AV FISTULA PLACEMENT     Hx: of  . AV FISTULA PLACEMENT Right 05/04/2013   Procedure: ARTERIOVENOUS (AV) FISTULA CREATION- RIGHT ARM;  Surgeon: Conrad Smithville, MD;  Location: [place];  Service: Vascular;  Laterality: Right;  Ultrasound guided  . AV FISTULA PLACEMENT Right 07/28/2016   Procedure: BRACHIOCEPHALIC ARTERIOVENOUS (AV) FISTULA CREATION;  Surgeon: [name], MD;  Location: [place];  Service: Vascular;  Laterality: Right;  .  AV FISTULA PLACEMENT Left 06/16/2018   Procedure: ARTERIOVENOUS (AV) FISTULA CREATION ( BRACHIALCEPHALIC );  Surgeon: Algernon Huxley, MD;  Location: [facility];  Service: Vascular;  Laterality: Left;  . AV FISTULA PLACEMENT Right 10/25/2018   Procedure: INSERTION OF RIGHT THIGH GRAFT using Vascular stretch graft;  Surgeon: Waynetta Sandy, MD;  Location: [place];  Service: Vascular;  Laterality: Right;  . BACK SURGERY    . BELOW  KNEE LEG AMPUTATION     1 PRIOR AMPUTATION ON FOOT  RT LEG/ FOOT  . CAPD INSERTION N/A 03/11/2018   Procedure: LAPAROSCOPIC INSERTION OF PERIOTONEAL DIALYSIS CATHETER;  Surgeon: Clovis Riley, MD;  Location: [place];  Service: General;  Laterality: N/A;  . CATARACT EXTRACTION W/PHACO Left 07/16/2014   Procedure: CATARACT EXTRACTION PHACO AND INTRAOCULAR LENS PLACEMENT ([place]);  Surgeon: [name], MD;  Location: [facility];  Service: Ophthalmology;  Laterality: Left;  CDE:8.86  . CATARACT EXTRACTION W/PHACO Right 07/30/2014   Procedure: CATARACT EXTRACTION PHACO AND INTRAOCULAR LENS PLACEMENT (IOC);  Surgeon: [name], MD;  Location: [facility];  Service: Ophthalmology;  Laterality: Right;  CDE 8.99  . COLONOSCOPY     Hx: of  . EYE SURGERY     bilateral cataract  . FISTULA SUPERFICIALIZATION Right 07/28/2016   Procedure: FISTULA SUPERFICIALIZATION;  Surgeon: [name], MD;  Location: [place];  Service: Vascular;  Laterality: Right;  . FISTULA SUPERFICIALIZATION Right 10/13/2016   Procedure: BRACHIOCEPHALIC ARTERIOVENOUS FISTULA SUPERFICIALIZATION;  Surgeon: [name], MD;  Location: [place];  Service: Vascular;  Laterality: Right;  . FISTULOGRAM N/A 05/04/2013   Procedure: CENTRAL VENOGRAM;  Surgeon: Conrad [place] Point, MD;  Location: [place];  Service: Vascular;  Laterality: N/A;  . INSERTION OF DIALYSIS CATHETER Right 05/04/2013   Procedure: INSERTION OF DIALYSIS CATHETER;  Surgeon: Conrad [place] Point, MD;  Location: [place];  Service: Vascular;  Laterality: Right;  Ultrasound guided  . INSERTION OF DIALYSIS CATHETER N/A 07/28/2016   Procedure: INSERTION OF DIALYSIS CATHETER;  Surgeon: [name], MD;  Location: [place];  Service: Vascular;  Laterality: N/A;  . INSERTION OF DIALYSIS CATHETER Right 03/07/2018   Procedure: INSERTION OF DIALYSIS CATHETER;  Surgeon: Conrad [place]ville, MD;  Location: [place];  Service: Vascular;  Laterality: Right;  . INSERTION OF DIALYSIS CATHETER Left 10/25/2018    Procedure: INSERTION OF TUNNELED DIALYSIS CATHETER IN THE LEFT FEMORAL VEIN;  Surgeon: Waynetta Sandy, MD;  Location: [place];  Service: Vascular;  Laterality: Left;  . IR FLUORO GUIDE CV LINE RIGHT  04/13/2017  . IR FLUORO GUIDE CV LINE RIGHT  05/19/2018  . IR REMOVAL TUN CV CATH W/O FL  10/04/2018  . IR REMOVAL TUN CV CATH W/O FL  12/06/2018  . IR THROMBECTOMY AV FISTULA W/THROMBOLYSIS/PTA/STENT INC/SHUNT/IMG RT Right 09/07/2017  . IR US GUIDE VASC ACCESS RIGHT  04/13/2017  . IR US GUIDE VASC ACCESS RIGHT  09/07/2017  . LIGATION OF ARTERIOVENOUS  FISTULA Left 05/04/2013   Procedure: LIGATION OF LEFT RADIAL CEPHALIC ARTERIOVENOUS  FISTULA;  Surgeon: Conrad Forestville, MD;  Location: [place];  Service: Vascular;  Laterality: Left;  Ultrasound guided  . LIGATION OF ARTERIOVENOUS  FISTULA Right 07/28/2016   Procedure: LIGATION OF RADIOCEPHALIC ARTERIOVENOUS  FISTULA;  Surgeon: [name], MD;  Location: [place];  Service: Vascular;  Laterality: Right;  . LOWER EXTREMITY ANGIOGRAPHY N/A 12/30/2016   Procedure: Lower Extremity Angiography;  Surgeon: Waynetta Sandy, MD;  Location: [place] CV LAB;  Service: Cardiovascular;  Laterality: N/A;  . LUMBAR SPINE SURGERY    . PERIPHERAL VASCULAR ATHERECTOMY Right 12/30/2016   Procedure: Peripheral Vascular Atherectomy;  Surgeon: Waynetta Sandy, MD;  Location: [place] CV LAB;  Service: Cardiovascular;  Laterality: Right;  AT and PT  . PERIPHERAL VASCULAR BALLOON ANGIOPLASTY Right 12/30/2016   Procedure: Peripheral Vascular Balloon Angioplasty;  Surgeon: Waynetta Sandy, MD;  Location: [place] CV LAB;  Service: Cardiovascular;  Laterality: Right;  PTA of PT and AT  . PERIPHERAL VASCULAR BALLOON ANGIOPLASTY  02/23/2017   Procedure: Peripheral Vascular Balloon Angioplasty;  Surgeon: [name], MD;  Location: [place] CV LAB;  Service: Cardiovascular;;  RT. Anterior Tib.  [name] PERIPHERAL VASCULAR BALLOON ANGIOPLASTY   04/08/2017   Procedure: PERIPHERAL VASCULAR BALLOON ANGIOPLASTY;  Surgeon: Conrad Eva, MD;  Location: [place] CV LAB;  Service: Cardiovascular;;  . PERIPHERAL VASCULAR CATHETERIZATION N/A 01/24/2015   Procedure: Fistulagram;  Surgeon: Conrad Laurel, MD;  Location: [place] CV LAB;  Service: Cardiovascular;  Laterality: N/A;  . PERIPHERAL VASCULAR CATHETERIZATION Right 06/04/2016   Procedure: A/V Shuntogram/Fistulagram;  Surgeon: Conrad Fulton, MD;  Location: [place] CV LAB;  Service: Cardiovascular;  Laterality: Right;  . REMOVAL OF A DIALYSIS CATHETER N/A 07/14/2018   Procedure: REMOVAL OF A DIALYSIS CATHETER;  Surgeon: Algernon Huxley, MD;  Location: [facility];  Service: Vascular;  Laterality: N/A;  . REVISON OF ARTERIOVENOUS FISTULA Right 01/02/2014   Procedure: REVISON OF ARTERIOVENOUS FISTULA ANASTOMOSIS;  Surgeon: Conrad [place], MD;  Location: [place];  Service: Vascular;  Laterality: Right;  . REVISON OF ARTERIOVENOUS FISTULA Right 01/02/2016   Procedure: REVISION OF RADIOCEPHALIC ARTERIOVENOUS FISTULA  with BOVINE PATCH ANGIOPLASTY RIGHT RADIAL ARTERY;  Surgeon: Mal [name], MD;  Location: [place];  Service: Vascular;  Laterality: Right;  . SHUNTOGRAM N/A 11/07/2013   Procedure: Fistulogram;  Surgeon: [name], MD;  Location: [facility] CATH LAB;  Service: Cardiovascular;  Laterality: N/A;  . THROMBECTOMY AND REVISION OF ARTERIOVENTOUS (AV) GORETEX  GRAFT Right 01/20/2018   Procedure: THROMBECTOMY  OF ARTERIOVENTOUS (AV)  HERO GRAFT;  Surgeon: [name], MD;  Location: [place];  Service: Vascular;  Laterality: Right;  . THROMBECTOMY W/ EMBOLECTOMY Right 07/18/2017   Procedure: THROMBECTOMY ARTERIOVENOUS HERO GRAFT ARM;  Surgeon: [name], MD;  Location: [place];  Service: Vascular;  Laterality: Right;  . THROMBECTOMY W/ EMBOLECTOMY Right 03/07/2018   Procedure: THROMBECTOMY OF HERO GRAFT; POSSIBLE REMOVAL;  Surgeon: Conrad , MD;  Location: [place];  Service: Vascular;   Laterality: Right;  . TRANSMETATARSAL AMPUTATION Right 01/01/2017   Procedure: TRANSMETATARSAL AMPUTATION;  Surgeon: [name], MD;  Location: [place];  Service: Vascular;  Laterality: Right;  [name] VASCULAR ACCESS DEVICE INSERTION Right 05/13/2017   Procedure: INSERTION OF HERO VASCULAR ACCESS DEVICE RIGHT UPPER ARM;  Surgeon: Waynetta Sandy, MD;  Location: [place];  Service: Vascular;  Laterality: Right;  . VENOGRAM N/A 05/13/2017   Procedure: CENTRAL VENOGRAM;  Surgeon: Waynetta Sandy, MD;  Location: [facility];  Service: Vascular;  Laterality: N/A;    Allergies: Bee venom, Iodine, Penicillins, Plavix [clopidogrel], and Sulfadiazine  Medications: Prior to Admission medications   Medication Sig Start Date End Date Taking? Authorizing Provider  acetaminophen (TYLENOL) 325 MG tablet Take 650 mg by mouth every 6 (six) hours as needed for mild pain. Do not exceed 4 gms of tylenol in 24 hours   Yes [provider]  atorvastatin (LIPITOR) 40 MG tablet Take 40 mg by mouth daily.   Yes [provider]  B Complex-C-Folic Acid (DIALYVITE PO) Take 1 tablet by mouth every Monday, Wednesday, and Friday.   Yes [provider]  calcium acetate (PHOSLO) 667 MG capsule Take 667-2,001 mg by mouth See admin instructions. Take 3 capsules (2001mg ) three times daily with a meal and 1 capsule (667mg ) with a snack. 11/04/17  Yes [provider]  carvedilol (COREG) 12.5 MG tablet Take 1 tablet (12.5 mg total) by mouth See admin instructions. Take 1 tablet (12.5 mg) by mouth twice daily on Sunday, Tuesday, Thursday, Saturday (hold on dialysis days) 04/10/17  Yes Madera, Carlos, MD  cinacalcet (SENSIPAR) 30 MG tablet Take 30 mg by mouth daily with breakfast.    Yes [provider]  clopidogrel (PLAVIX) 75 MG tablet Take 75 mg by mouth daily.  03/11/18  Yes [provider]  HYDROcodone-acetaminophen (NORCO) 5-325 MG tablet Take 1 tablet by mouth every 6 (six)  hours as needed for moderate pain. 07/14/18  Yes Dew, Jason S, MD  LANTUS SOLOSTAR 100 UNIT/ML Solostar Pen Inject 15 Units into the skin at bedtime. 03/02/17  Yes Short, Mackenzie, MD  omeprazole (PRILOSEC) 40 MG capsule Take 40 mg by mouth daily.   Yes [provider]  EPINEPHrine (EPIPEN 2-PAK) 0.3 mg/0.3 mL IJ SOAJ injection Inject 0.3 mg into the muscle once as needed (anaphylaxis).    [provider]     Family History  Problem Relation Age of Onset  . Heart attack Brother        67  . Heart disease Brother        before age 60  . Hypertension Brother   . Heart attack Brother        62  . Heart attack Brother        65   . Diabetes Mother   . Hypertension Mother   . Heart disease Mother   . Heart disease Father   . Hypertension Father   . Other Father        amputation  . Hypertension Sister   . Heart disease Sister   . Vision loss Maternal Uncle     Social History   Socioeconomic History  . Marital status: Divorced    Spouse name: Not on file  . Number of children: Not on file  . Years of education: Not on file  . Highest education level: Not on file  Occupational History  . Not on file  Social Needs  . Financial resource strain: Not very hard  . Food insecurity    Worry: Not on file    Inability: Not on file  . Transportation needs    Medical: No    Non-medical: Not on file  Tobacco Use  . Smoking status: Never Smoker  . Smokeless tobacco: Never Used  Substance and Sexual Activity  . Alcohol use: Yes    Comment: 1- fifth of gin a week  . Drug use: No  . Sexual activity: Yes    Birth control/protection: None  Lifestyle  . Physical activity    Days per week: 0 days    Minutes per session: Not on file  . Stress: Only a little  Relationships  . Social Herbalist on phone: Once a week    Gets together: Not on file    Attends religious service: Not on file    Active member of club or organization: Not  on file    Attends  meetings of clubs or organizations: Not on file    Relationship status: Not on file  Other Topics Concern  . Not on file  Social History Narrative  . Not on file     Review of Systems: A 12 point ROS discussed and pertinent positives are indicated in the HPI above.  All other systems are negative.  Review of Systems  Constitutional: Negative for fatigue and fever.  Respiratory: Negative for cough and shortness of breath.   Cardiovascular: Negative for chest pain.  Gastrointestinal: Negative for abdominal pain.  Musculoskeletal: Negative for back pain.  Psychiatric/Behavioral: Negative for behavioral problems and confusion.    Vital Signs: There were no vitals taken for this visit.  Physical Exam Vitals signs and nursing note reviewed.  Constitutional:      Appearance: Normal appearance.  HENT:     Mouth/Throat:     Mouth: Mucous membranes are moist.     Pharynx: Oropharynx is clear.  Neck:     Musculoskeletal: Normal range of motion.     Comments: Multiple scars from previous catheter placements. Cardiovascular:     Rate and Rhythm: Normal rate and regular rhythm.     Heart sounds: No murmur.  Pulmonary:     Effort: Pulmonary effort is normal.     Breath sounds: Normal breath sounds.  Abdominal:     General: Abdomen is flat.     Palpations: Abdomen is soft.  Musculoskeletal:     Comments: R thigh graft without bruit or thrill.  No palpable pulse.  Neurological:     General: No focal deficit present.     Mental Status: He is alert and oriented to person, place, and time. Mental status is at baseline.  Psychiatric:        Mood and Affect: Mood normal.        Behavior: Behavior normal.        Thought Content: Thought content normal.        Judgment: Judgment normal.      MD Evaluation Airway: WNL Heart: WNL Abdomen: WNL Chest/ Lungs: WNL ASA  Classification: 3 Mallampati/Airway Score: Three   Imaging: No results found.  Labs:  CBC: Recent Labs     10/24/18 1424 10/25/18 0426 10/26/18 0647 02/28/19 1345  WBC 5.6 5.5 8.4 5.5  HGB 15.5 14.8 13.8 15.6  HCT 50.2 47.6 44.4 50.1  PLT 190 191 159 184    COAGS: Recent Labs    06/14/18 1529 07/12/18 1351 02/28/19 1345  INR 1.10 1.13 1.1  APTT 36 35  --     BMP: Recent Labs    10/24/18 1123 10/24/18 1424 10/25/18 0419 10/26/18 0647 02/28/19 1345  NA 134*  --  134* 133* 134*  K 3.8  --  4.1 4.1 4.7  CL 95*  --  97* 93* 98  CO2 21*  --  18* 22 18*  GLUCOSE 254*  --  226* 276* 169*  BUN 77*  --  84* 56* 73*  CALCIUM 8.0*  --  8.1* 7.9* 8.1*  CREATININE 14.98* 15.35* 16.52* 13.10* 15.88*  GFRNONAA 3* 3* 3* 3* 3*  GFRAA 3* 3* 3* 4* 3*    LIVER FUNCTION TESTS: Recent Labs    06/14/18 1529 10/25/18 0419 10/26/18 0647  BILITOT 0.6  --   --   AST 15  --   --   ALT 15  --   --   ALKPHOS 278*  --   --  PROT 8.7*  --   --   ALBUMIN 3.6 3.1* 2.9*    TUMOR MARKERS: No results for input(s): AFPTM, CEA, CA199, CHROMGRNA in the last 8760 hours.  Assessment and Plan: Patient with past medical history of renal failure on HD via R thigh graft who presents with complaint of clotted access.  IR consulted for declotting at the request of Dr. Marval Regal. Patient presents today in their usual state of health.  He has been NPO and is not currently on blood thinners.  He has taken prednisone prior to procedure today for contrast allergy. Will also give 50 mg benadryl IV.   Risks and benefits discussed with the patient including, but not limited to bleeding, infection, vascular injury, pulmonary embolism, need for tunneled HD catheter placement or even death.  All of the patient's questions were answered, patient is agreeable to proceed. Consent signed and in chart.  Thank you for this interesting consult.  I greatly enjoyed meeting TOBYN OSGOOD and look forward to participating in their care.  A copy of this report was sent to the requesting provider on this date.   Electronically Signed: Docia Barrier, PA 02/28/2019, 2:33 PM   I spent a total of  30 Minutes   in face to face in clinical consultation, greater than 50% of which was counseling/coordinating care for renal failure, clotted dialysis access.

## 2019-02-28 NOTE — Progress Notes (Signed)
Right leg procedure site clean and dry. No evidence of bleeding.

## 2019-02-28 NOTE — Discharge Instructions (Signed)

## 2019-02-28 NOTE — Progress Notes (Signed)
IR.  Patient with history of ESRD on HD via right thigh AV graft which was found to be thrombosed s/p right thigh AV graft declot today by Dr. Anselm Pancoast.  Received call from Amy, RN stating that patient had bleeding from thrombectomy site. On PE, site dressed with pressure dressing (gauze, tegaderm). Removed pressure dressing and site c/d/i with suture intact. Thrill palpable and bruit heard on ascultation. Redressed site and updated Dr. Anselm Pancoast. Re-assessed site with Dr. Anselm Pancoast at bedside and site with oozing medial to suture. Manual pressure was held with continued oozing. V-pad applied to oozing site and re-dressed.  Reviewed site with Amy, RN. Plan to monitor site x1 hour, and if no signs of oozing then patient ready for discharge. Vpad and suture to be removed tomorrow at dialysis.  Please call IR with questions/concerns.   Bea Graff Alfons Sulkowski, PA-C 02/28/2019, 5:55 PM

## 2019-03-01 DIAGNOSIS — Z992 Dependence on renal dialysis: Secondary | ICD-10-CM | POA: Diagnosis not present

## 2019-03-01 DIAGNOSIS — N186 End stage renal disease: Secondary | ICD-10-CM | POA: Diagnosis not present

## 2019-03-01 DIAGNOSIS — N2581 Secondary hyperparathyroidism of renal origin: Secondary | ICD-10-CM | POA: Diagnosis not present

## 2019-03-01 DIAGNOSIS — D509 Iron deficiency anemia, unspecified: Secondary | ICD-10-CM | POA: Diagnosis not present

## 2019-03-03 DIAGNOSIS — N2581 Secondary hyperparathyroidism of renal origin: Secondary | ICD-10-CM | POA: Diagnosis not present

## 2019-03-03 DIAGNOSIS — Z992 Dependence on renal dialysis: Secondary | ICD-10-CM | POA: Diagnosis not present

## 2019-03-03 DIAGNOSIS — D509 Iron deficiency anemia, unspecified: Secondary | ICD-10-CM | POA: Diagnosis not present

## 2019-03-03 DIAGNOSIS — N186 End stage renal disease: Secondary | ICD-10-CM | POA: Diagnosis not present

## 2019-03-04 DIAGNOSIS — N186 End stage renal disease: Secondary | ICD-10-CM | POA: Diagnosis not present

## 2019-03-04 DIAGNOSIS — Z992 Dependence on renal dialysis: Secondary | ICD-10-CM | POA: Diagnosis not present

## 2019-03-04 DIAGNOSIS — D631 Anemia in chronic kidney disease: Secondary | ICD-10-CM | POA: Diagnosis not present

## 2019-03-04 DIAGNOSIS — D509 Iron deficiency anemia, unspecified: Secondary | ICD-10-CM | POA: Diagnosis not present

## 2019-03-04 DIAGNOSIS — N2581 Secondary hyperparathyroidism of renal origin: Secondary | ICD-10-CM | POA: Diagnosis not present

## 2019-03-06 DIAGNOSIS — N2581 Secondary hyperparathyroidism of renal origin: Secondary | ICD-10-CM | POA: Diagnosis not present

## 2019-03-06 DIAGNOSIS — Z992 Dependence on renal dialysis: Secondary | ICD-10-CM | POA: Diagnosis not present

## 2019-03-06 DIAGNOSIS — D509 Iron deficiency anemia, unspecified: Secondary | ICD-10-CM | POA: Diagnosis not present

## 2019-03-06 DIAGNOSIS — D631 Anemia in chronic kidney disease: Secondary | ICD-10-CM | POA: Diagnosis not present

## 2019-03-06 DIAGNOSIS — N186 End stage renal disease: Secondary | ICD-10-CM | POA: Diagnosis not present

## 2019-03-07 DIAGNOSIS — D631 Anemia in chronic kidney disease: Secondary | ICD-10-CM | POA: Diagnosis not present

## 2019-03-07 DIAGNOSIS — Z992 Dependence on renal dialysis: Secondary | ICD-10-CM | POA: Diagnosis not present

## 2019-03-07 DIAGNOSIS — N2581 Secondary hyperparathyroidism of renal origin: Secondary | ICD-10-CM | POA: Diagnosis not present

## 2019-03-07 DIAGNOSIS — N186 End stage renal disease: Secondary | ICD-10-CM | POA: Diagnosis not present

## 2019-03-07 DIAGNOSIS — D509 Iron deficiency anemia, unspecified: Secondary | ICD-10-CM | POA: Diagnosis not present

## 2019-03-08 DIAGNOSIS — N186 End stage renal disease: Secondary | ICD-10-CM | POA: Diagnosis not present

## 2019-03-08 DIAGNOSIS — D631 Anemia in chronic kidney disease: Secondary | ICD-10-CM | POA: Diagnosis not present

## 2019-03-08 DIAGNOSIS — N2581 Secondary hyperparathyroidism of renal origin: Secondary | ICD-10-CM | POA: Diagnosis not present

## 2019-03-08 DIAGNOSIS — D509 Iron deficiency anemia, unspecified: Secondary | ICD-10-CM | POA: Diagnosis not present

## 2019-03-08 DIAGNOSIS — Z992 Dependence on renal dialysis: Secondary | ICD-10-CM | POA: Diagnosis not present

## 2019-03-10 DIAGNOSIS — Z992 Dependence on renal dialysis: Secondary | ICD-10-CM | POA: Diagnosis not present

## 2019-03-10 DIAGNOSIS — D509 Iron deficiency anemia, unspecified: Secondary | ICD-10-CM | POA: Diagnosis not present

## 2019-03-10 DIAGNOSIS — D631 Anemia in chronic kidney disease: Secondary | ICD-10-CM | POA: Diagnosis not present

## 2019-03-10 DIAGNOSIS — N2581 Secondary hyperparathyroidism of renal origin: Secondary | ICD-10-CM | POA: Diagnosis not present

## 2019-03-10 DIAGNOSIS — N186 End stage renal disease: Secondary | ICD-10-CM | POA: Diagnosis not present

## 2019-03-13 DIAGNOSIS — Z992 Dependence on renal dialysis: Secondary | ICD-10-CM | POA: Diagnosis not present

## 2019-03-13 DIAGNOSIS — N2581 Secondary hyperparathyroidism of renal origin: Secondary | ICD-10-CM | POA: Diagnosis not present

## 2019-03-13 DIAGNOSIS — D631 Anemia in chronic kidney disease: Secondary | ICD-10-CM | POA: Diagnosis not present

## 2019-03-13 DIAGNOSIS — D509 Iron deficiency anemia, unspecified: Secondary | ICD-10-CM | POA: Diagnosis not present

## 2019-03-13 DIAGNOSIS — N186 End stage renal disease: Secondary | ICD-10-CM | POA: Diagnosis not present

## 2019-03-14 DIAGNOSIS — E113591 Type 2 diabetes mellitus with proliferative diabetic retinopathy without macular edema, right eye: Secondary | ICD-10-CM | POA: Diagnosis not present

## 2019-03-14 DIAGNOSIS — E113592 Type 2 diabetes mellitus with proliferative diabetic retinopathy without macular edema, left eye: Secondary | ICD-10-CM | POA: Diagnosis not present

## 2019-03-14 DIAGNOSIS — E113511 Type 2 diabetes mellitus with proliferative diabetic retinopathy with macular edema, right eye: Secondary | ICD-10-CM | POA: Diagnosis not present

## 2019-03-14 DIAGNOSIS — E113512 Type 2 diabetes mellitus with proliferative diabetic retinopathy with macular edema, left eye: Secondary | ICD-10-CM | POA: Diagnosis not present

## 2019-03-14 DIAGNOSIS — H4051X3 Glaucoma secondary to other eye disorders, right eye, severe stage: Secondary | ICD-10-CM | POA: Diagnosis not present

## 2019-03-14 DIAGNOSIS — H4052X3 Glaucoma secondary to other eye disorders, left eye, severe stage: Secondary | ICD-10-CM | POA: Diagnosis not present

## 2019-03-15 DIAGNOSIS — N2581 Secondary hyperparathyroidism of renal origin: Secondary | ICD-10-CM | POA: Diagnosis not present

## 2019-03-15 DIAGNOSIS — N186 End stage renal disease: Secondary | ICD-10-CM | POA: Diagnosis not present

## 2019-03-15 DIAGNOSIS — Z992 Dependence on renal dialysis: Secondary | ICD-10-CM | POA: Diagnosis not present

## 2019-03-15 DIAGNOSIS — D509 Iron deficiency anemia, unspecified: Secondary | ICD-10-CM | POA: Diagnosis not present

## 2019-03-15 DIAGNOSIS — D631 Anemia in chronic kidney disease: Secondary | ICD-10-CM | POA: Diagnosis not present

## 2019-03-17 DIAGNOSIS — N2581 Secondary hyperparathyroidism of renal origin: Secondary | ICD-10-CM | POA: Diagnosis not present

## 2019-03-17 DIAGNOSIS — N186 End stage renal disease: Secondary | ICD-10-CM | POA: Diagnosis not present

## 2019-03-17 DIAGNOSIS — Z992 Dependence on renal dialysis: Secondary | ICD-10-CM | POA: Diagnosis not present

## 2019-03-17 DIAGNOSIS — D631 Anemia in chronic kidney disease: Secondary | ICD-10-CM | POA: Diagnosis not present

## 2019-03-17 DIAGNOSIS — D509 Iron deficiency anemia, unspecified: Secondary | ICD-10-CM | POA: Diagnosis not present

## 2019-03-20 ENCOUNTER — Other Ambulatory Visit (INDEPENDENT_AMBULATORY_CARE_PROVIDER_SITE_OTHER): Payer: Self-pay | Admitting: Vascular Surgery

## 2019-03-20 DIAGNOSIS — Z992 Dependence on renal dialysis: Secondary | ICD-10-CM | POA: Diagnosis not present

## 2019-03-20 DIAGNOSIS — N2581 Secondary hyperparathyroidism of renal origin: Secondary | ICD-10-CM | POA: Diagnosis not present

## 2019-03-20 DIAGNOSIS — N186 End stage renal disease: Secondary | ICD-10-CM | POA: Diagnosis not present

## 2019-03-20 DIAGNOSIS — D509 Iron deficiency anemia, unspecified: Secondary | ICD-10-CM | POA: Diagnosis not present

## 2019-03-20 DIAGNOSIS — I770 Arteriovenous fistula, acquired: Secondary | ICD-10-CM

## 2019-03-20 DIAGNOSIS — D631 Anemia in chronic kidney disease: Secondary | ICD-10-CM | POA: Diagnosis not present

## 2019-03-21 ENCOUNTER — Ambulatory Visit (INDEPENDENT_AMBULATORY_CARE_PROVIDER_SITE_OTHER): Payer: Medicare Other | Admitting: Vascular Surgery

## 2019-03-21 ENCOUNTER — Other Ambulatory Visit (INDEPENDENT_AMBULATORY_CARE_PROVIDER_SITE_OTHER): Payer: Self-pay | Admitting: Vascular Surgery

## 2019-03-21 ENCOUNTER — Encounter (INDEPENDENT_AMBULATORY_CARE_PROVIDER_SITE_OTHER): Payer: Medicare Other

## 2019-03-21 DIAGNOSIS — Z9889 Other specified postprocedural states: Secondary | ICD-10-CM

## 2019-03-21 DIAGNOSIS — N186 End stage renal disease: Secondary | ICD-10-CM

## 2019-03-21 DIAGNOSIS — Z95828 Presence of other vascular implants and grafts: Secondary | ICD-10-CM

## 2019-03-22 DIAGNOSIS — Z992 Dependence on renal dialysis: Secondary | ICD-10-CM | POA: Diagnosis not present

## 2019-03-22 DIAGNOSIS — D509 Iron deficiency anemia, unspecified: Secondary | ICD-10-CM | POA: Diagnosis not present

## 2019-03-22 DIAGNOSIS — N186 End stage renal disease: Secondary | ICD-10-CM | POA: Diagnosis not present

## 2019-03-22 DIAGNOSIS — N2581 Secondary hyperparathyroidism of renal origin: Secondary | ICD-10-CM | POA: Diagnosis not present

## 2019-03-22 DIAGNOSIS — D631 Anemia in chronic kidney disease: Secondary | ICD-10-CM | POA: Diagnosis not present

## 2019-03-23 DIAGNOSIS — E113511 Type 2 diabetes mellitus with proliferative diabetic retinopathy with macular edema, right eye: Secondary | ICD-10-CM | POA: Diagnosis not present

## 2019-03-24 DIAGNOSIS — D509 Iron deficiency anemia, unspecified: Secondary | ICD-10-CM | POA: Diagnosis not present

## 2019-03-24 DIAGNOSIS — N186 End stage renal disease: Secondary | ICD-10-CM | POA: Diagnosis not present

## 2019-03-24 DIAGNOSIS — D631 Anemia in chronic kidney disease: Secondary | ICD-10-CM | POA: Diagnosis not present

## 2019-03-24 DIAGNOSIS — N2581 Secondary hyperparathyroidism of renal origin: Secondary | ICD-10-CM | POA: Diagnosis not present

## 2019-03-24 DIAGNOSIS — Z992 Dependence on renal dialysis: Secondary | ICD-10-CM | POA: Diagnosis not present

## 2019-03-27 DIAGNOSIS — Z992 Dependence on renal dialysis: Secondary | ICD-10-CM | POA: Diagnosis not present

## 2019-03-27 DIAGNOSIS — N2581 Secondary hyperparathyroidism of renal origin: Secondary | ICD-10-CM | POA: Diagnosis not present

## 2019-03-27 DIAGNOSIS — D509 Iron deficiency anemia, unspecified: Secondary | ICD-10-CM | POA: Diagnosis not present

## 2019-03-27 DIAGNOSIS — D631 Anemia in chronic kidney disease: Secondary | ICD-10-CM | POA: Diagnosis not present

## 2019-03-27 DIAGNOSIS — N186 End stage renal disease: Secondary | ICD-10-CM | POA: Diagnosis not present

## 2019-03-29 DIAGNOSIS — D631 Anemia in chronic kidney disease: Secondary | ICD-10-CM | POA: Diagnosis not present

## 2019-03-29 DIAGNOSIS — N186 End stage renal disease: Secondary | ICD-10-CM | POA: Diagnosis not present

## 2019-03-29 DIAGNOSIS — Z992 Dependence on renal dialysis: Secondary | ICD-10-CM | POA: Diagnosis not present

## 2019-03-29 DIAGNOSIS — N2581 Secondary hyperparathyroidism of renal origin: Secondary | ICD-10-CM | POA: Diagnosis not present

## 2019-03-29 DIAGNOSIS — D509 Iron deficiency anemia, unspecified: Secondary | ICD-10-CM | POA: Diagnosis not present

## 2019-03-30 DIAGNOSIS — E113512 Type 2 diabetes mellitus with proliferative diabetic retinopathy with macular edema, left eye: Secondary | ICD-10-CM | POA: Diagnosis not present

## 2019-03-31 DIAGNOSIS — N2581 Secondary hyperparathyroidism of renal origin: Secondary | ICD-10-CM | POA: Diagnosis not present

## 2019-03-31 DIAGNOSIS — N186 End stage renal disease: Secondary | ICD-10-CM | POA: Diagnosis not present

## 2019-03-31 DIAGNOSIS — Z992 Dependence on renal dialysis: Secondary | ICD-10-CM | POA: Diagnosis not present

## 2019-03-31 DIAGNOSIS — D509 Iron deficiency anemia, unspecified: Secondary | ICD-10-CM | POA: Diagnosis not present

## 2019-03-31 DIAGNOSIS — D631 Anemia in chronic kidney disease: Secondary | ICD-10-CM | POA: Diagnosis not present

## 2019-04-03 DIAGNOSIS — N186 End stage renal disease: Secondary | ICD-10-CM | POA: Diagnosis not present

## 2019-04-03 DIAGNOSIS — Z992 Dependence on renal dialysis: Secondary | ICD-10-CM | POA: Diagnosis not present

## 2019-04-03 DIAGNOSIS — N2581 Secondary hyperparathyroidism of renal origin: Secondary | ICD-10-CM | POA: Diagnosis not present

## 2019-04-03 DIAGNOSIS — D509 Iron deficiency anemia, unspecified: Secondary | ICD-10-CM | POA: Diagnosis not present

## 2019-04-03 DIAGNOSIS — D631 Anemia in chronic kidney disease: Secondary | ICD-10-CM | POA: Diagnosis not present

## 2019-04-04 DIAGNOSIS — Z23 Encounter for immunization: Secondary | ICD-10-CM | POA: Diagnosis not present

## 2019-04-04 DIAGNOSIS — Z992 Dependence on renal dialysis: Secondary | ICD-10-CM | POA: Diagnosis not present

## 2019-04-04 DIAGNOSIS — D509 Iron deficiency anemia, unspecified: Secondary | ICD-10-CM | POA: Diagnosis not present

## 2019-04-04 DIAGNOSIS — N186 End stage renal disease: Secondary | ICD-10-CM | POA: Diagnosis not present

## 2019-04-04 DIAGNOSIS — N2581 Secondary hyperparathyroidism of renal origin: Secondary | ICD-10-CM | POA: Diagnosis not present

## 2019-04-05 DIAGNOSIS — Z992 Dependence on renal dialysis: Secondary | ICD-10-CM | POA: Diagnosis not present

## 2019-04-05 DIAGNOSIS — N186 End stage renal disease: Secondary | ICD-10-CM | POA: Diagnosis not present

## 2019-04-05 DIAGNOSIS — D509 Iron deficiency anemia, unspecified: Secondary | ICD-10-CM | POA: Diagnosis not present

## 2019-04-05 DIAGNOSIS — Z23 Encounter for immunization: Secondary | ICD-10-CM | POA: Diagnosis not present

## 2019-04-05 DIAGNOSIS — N2581 Secondary hyperparathyroidism of renal origin: Secondary | ICD-10-CM | POA: Diagnosis not present

## 2019-04-06 DIAGNOSIS — D509 Iron deficiency anemia, unspecified: Secondary | ICD-10-CM | POA: Diagnosis not present

## 2019-04-06 DIAGNOSIS — N2581 Secondary hyperparathyroidism of renal origin: Secondary | ICD-10-CM | POA: Diagnosis not present

## 2019-04-06 DIAGNOSIS — N186 End stage renal disease: Secondary | ICD-10-CM | POA: Diagnosis not present

## 2019-04-06 DIAGNOSIS — Z23 Encounter for immunization: Secondary | ICD-10-CM | POA: Diagnosis not present

## 2019-04-06 DIAGNOSIS — Z992 Dependence on renal dialysis: Secondary | ICD-10-CM | POA: Diagnosis not present

## 2019-04-07 DIAGNOSIS — D509 Iron deficiency anemia, unspecified: Secondary | ICD-10-CM | POA: Diagnosis not present

## 2019-04-07 DIAGNOSIS — N186 End stage renal disease: Secondary | ICD-10-CM | POA: Diagnosis not present

## 2019-04-07 DIAGNOSIS — Z23 Encounter for immunization: Secondary | ICD-10-CM | POA: Diagnosis not present

## 2019-04-07 DIAGNOSIS — Z992 Dependence on renal dialysis: Secondary | ICD-10-CM | POA: Diagnosis not present

## 2019-04-07 DIAGNOSIS — N2581 Secondary hyperparathyroidism of renal origin: Secondary | ICD-10-CM | POA: Diagnosis not present

## 2019-04-10 DIAGNOSIS — N186 End stage renal disease: Secondary | ICD-10-CM | POA: Diagnosis not present

## 2019-04-10 DIAGNOSIS — Z992 Dependence on renal dialysis: Secondary | ICD-10-CM | POA: Diagnosis not present

## 2019-04-10 DIAGNOSIS — N2581 Secondary hyperparathyroidism of renal origin: Secondary | ICD-10-CM | POA: Diagnosis not present

## 2019-04-10 DIAGNOSIS — D509 Iron deficiency anemia, unspecified: Secondary | ICD-10-CM | POA: Diagnosis not present

## 2019-04-10 DIAGNOSIS — Z23 Encounter for immunization: Secondary | ICD-10-CM | POA: Diagnosis not present

## 2019-04-14 DIAGNOSIS — D509 Iron deficiency anemia, unspecified: Secondary | ICD-10-CM | POA: Diagnosis not present

## 2019-04-14 DIAGNOSIS — N186 End stage renal disease: Secondary | ICD-10-CM | POA: Diagnosis not present

## 2019-04-14 DIAGNOSIS — Z23 Encounter for immunization: Secondary | ICD-10-CM | POA: Diagnosis not present

## 2019-04-14 DIAGNOSIS — Z992 Dependence on renal dialysis: Secondary | ICD-10-CM | POA: Diagnosis not present

## 2019-04-14 DIAGNOSIS — N2581 Secondary hyperparathyroidism of renal origin: Secondary | ICD-10-CM | POA: Diagnosis not present

## 2019-04-17 DIAGNOSIS — N186 End stage renal disease: Secondary | ICD-10-CM | POA: Diagnosis not present

## 2019-04-17 DIAGNOSIS — N2581 Secondary hyperparathyroidism of renal origin: Secondary | ICD-10-CM | POA: Diagnosis not present

## 2019-04-17 DIAGNOSIS — Z7982 Long term (current) use of aspirin: Secondary | ICD-10-CM | POA: Diagnosis not present

## 2019-04-17 DIAGNOSIS — Z880 Allergy status to penicillin: Secondary | ICD-10-CM | POA: Diagnosis not present

## 2019-04-17 DIAGNOSIS — R0602 Shortness of breath: Secondary | ICD-10-CM | POA: Diagnosis not present

## 2019-04-17 DIAGNOSIS — E1122 Type 2 diabetes mellitus with diabetic chronic kidney disease: Secondary | ICD-10-CM | POA: Diagnosis not present

## 2019-04-17 DIAGNOSIS — Z91030 Bee allergy status: Secondary | ICD-10-CM | POA: Diagnosis not present

## 2019-04-17 DIAGNOSIS — Z833 Family history of diabetes mellitus: Secondary | ICD-10-CM | POA: Diagnosis not present

## 2019-04-17 DIAGNOSIS — Z23 Encounter for immunization: Secondary | ICD-10-CM | POA: Diagnosis not present

## 2019-04-17 DIAGNOSIS — Z8249 Family history of ischemic heart disease and other diseases of the circulatory system: Secondary | ICD-10-CM | POA: Diagnosis not present

## 2019-04-17 DIAGNOSIS — I132 Hypertensive heart and chronic kidney disease with heart failure and with stage 5 chronic kidney disease, or end stage renal disease: Secondary | ICD-10-CM | POA: Diagnosis not present

## 2019-04-17 DIAGNOSIS — I509 Heart failure, unspecified: Secondary | ICD-10-CM | POA: Diagnosis not present

## 2019-04-17 DIAGNOSIS — D509 Iron deficiency anemia, unspecified: Secondary | ICD-10-CM | POA: Diagnosis not present

## 2019-04-17 DIAGNOSIS — Z79899 Other long term (current) drug therapy: Secondary | ICD-10-CM | POA: Diagnosis not present

## 2019-04-17 DIAGNOSIS — Z794 Long term (current) use of insulin: Secondary | ICD-10-CM | POA: Diagnosis not present

## 2019-04-17 DIAGNOSIS — J45901 Unspecified asthma with (acute) exacerbation: Secondary | ICD-10-CM | POA: Diagnosis not present

## 2019-04-17 DIAGNOSIS — Z992 Dependence on renal dialysis: Secondary | ICD-10-CM | POA: Diagnosis not present

## 2019-04-19 DIAGNOSIS — Z23 Encounter for immunization: Secondary | ICD-10-CM | POA: Diagnosis not present

## 2019-04-19 DIAGNOSIS — N186 End stage renal disease: Secondary | ICD-10-CM | POA: Diagnosis not present

## 2019-04-19 DIAGNOSIS — D509 Iron deficiency anemia, unspecified: Secondary | ICD-10-CM | POA: Diagnosis not present

## 2019-04-19 DIAGNOSIS — N2581 Secondary hyperparathyroidism of renal origin: Secondary | ICD-10-CM | POA: Diagnosis not present

## 2019-04-19 DIAGNOSIS — Z992 Dependence on renal dialysis: Secondary | ICD-10-CM | POA: Diagnosis not present

## 2019-04-21 DIAGNOSIS — D509 Iron deficiency anemia, unspecified: Secondary | ICD-10-CM | POA: Diagnosis not present

## 2019-04-21 DIAGNOSIS — N2581 Secondary hyperparathyroidism of renal origin: Secondary | ICD-10-CM | POA: Diagnosis not present

## 2019-04-21 DIAGNOSIS — Z992 Dependence on renal dialysis: Secondary | ICD-10-CM | POA: Diagnosis not present

## 2019-04-21 DIAGNOSIS — N186 End stage renal disease: Secondary | ICD-10-CM | POA: Diagnosis not present

## 2019-04-21 DIAGNOSIS — Z23 Encounter for immunization: Secondary | ICD-10-CM | POA: Diagnosis not present

## 2019-04-24 DIAGNOSIS — N186 End stage renal disease: Secondary | ICD-10-CM | POA: Diagnosis not present

## 2019-04-24 DIAGNOSIS — D509 Iron deficiency anemia, unspecified: Secondary | ICD-10-CM | POA: Diagnosis not present

## 2019-04-24 DIAGNOSIS — Z23 Encounter for immunization: Secondary | ICD-10-CM | POA: Diagnosis not present

## 2019-04-24 DIAGNOSIS — Z992 Dependence on renal dialysis: Secondary | ICD-10-CM | POA: Diagnosis not present

## 2019-04-24 DIAGNOSIS — N2581 Secondary hyperparathyroidism of renal origin: Secondary | ICD-10-CM | POA: Diagnosis not present

## 2019-04-26 ENCOUNTER — Other Ambulatory Visit: Payer: Medicare Other

## 2019-04-26 DIAGNOSIS — N186 End stage renal disease: Secondary | ICD-10-CM | POA: Diagnosis not present

## 2019-04-26 DIAGNOSIS — Z23 Encounter for immunization: Secondary | ICD-10-CM | POA: Diagnosis not present

## 2019-04-26 DIAGNOSIS — Z992 Dependence on renal dialysis: Secondary | ICD-10-CM | POA: Diagnosis not present

## 2019-04-26 DIAGNOSIS — D509 Iron deficiency anemia, unspecified: Secondary | ICD-10-CM | POA: Diagnosis not present

## 2019-04-26 DIAGNOSIS — N2581 Secondary hyperparathyroidism of renal origin: Secondary | ICD-10-CM | POA: Diagnosis not present

## 2019-04-27 DIAGNOSIS — E113512 Type 2 diabetes mellitus with proliferative diabetic retinopathy with macular edema, left eye: Secondary | ICD-10-CM | POA: Diagnosis not present

## 2019-04-27 DIAGNOSIS — E113511 Type 2 diabetes mellitus with proliferative diabetic retinopathy with macular edema, right eye: Secondary | ICD-10-CM | POA: Diagnosis not present

## 2019-04-27 DIAGNOSIS — H4051X3 Glaucoma secondary to other eye disorders, right eye, severe stage: Secondary | ICD-10-CM | POA: Diagnosis not present

## 2019-04-27 DIAGNOSIS — H4052X3 Glaucoma secondary to other eye disorders, left eye, severe stage: Secondary | ICD-10-CM | POA: Diagnosis not present

## 2019-04-28 DIAGNOSIS — Z794 Long term (current) use of insulin: Secondary | ICD-10-CM | POA: Diagnosis not present

## 2019-04-28 DIAGNOSIS — Z23 Encounter for immunization: Secondary | ICD-10-CM | POA: Diagnosis not present

## 2019-04-28 DIAGNOSIS — D509 Iron deficiency anemia, unspecified: Secondary | ICD-10-CM | POA: Diagnosis not present

## 2019-04-28 DIAGNOSIS — N2581 Secondary hyperparathyroidism of renal origin: Secondary | ICD-10-CM | POA: Diagnosis not present

## 2019-04-28 DIAGNOSIS — E119 Type 2 diabetes mellitus without complications: Secondary | ICD-10-CM | POA: Diagnosis not present

## 2019-04-28 DIAGNOSIS — N186 End stage renal disease: Secondary | ICD-10-CM | POA: Diagnosis not present

## 2019-04-28 DIAGNOSIS — Z992 Dependence on renal dialysis: Secondary | ICD-10-CM | POA: Diagnosis not present

## 2019-05-01 DIAGNOSIS — N186 End stage renal disease: Secondary | ICD-10-CM | POA: Diagnosis not present

## 2019-05-01 DIAGNOSIS — D509 Iron deficiency anemia, unspecified: Secondary | ICD-10-CM | POA: Diagnosis not present

## 2019-05-01 DIAGNOSIS — Z992 Dependence on renal dialysis: Secondary | ICD-10-CM | POA: Diagnosis not present

## 2019-05-01 DIAGNOSIS — Z23 Encounter for immunization: Secondary | ICD-10-CM | POA: Diagnosis not present

## 2019-05-01 DIAGNOSIS — N2581 Secondary hyperparathyroidism of renal origin: Secondary | ICD-10-CM | POA: Diagnosis not present

## 2019-05-02 DIAGNOSIS — E1129 Type 2 diabetes mellitus with other diabetic kidney complication: Secondary | ICD-10-CM | POA: Diagnosis not present

## 2019-05-02 DIAGNOSIS — Z6835 Body mass index (BMI) 35.0-35.9, adult: Secondary | ICD-10-CM | POA: Diagnosis not present

## 2019-05-02 DIAGNOSIS — J40 Bronchitis, not specified as acute or chronic: Secondary | ICD-10-CM | POA: Diagnosis not present

## 2019-05-02 DIAGNOSIS — J4541 Moderate persistent asthma with (acute) exacerbation: Secondary | ICD-10-CM | POA: Diagnosis not present

## 2019-05-02 DIAGNOSIS — E6609 Other obesity due to excess calories: Secondary | ICD-10-CM | POA: Diagnosis not present

## 2019-05-03 DIAGNOSIS — Z992 Dependence on renal dialysis: Secondary | ICD-10-CM | POA: Diagnosis not present

## 2019-05-03 DIAGNOSIS — D509 Iron deficiency anemia, unspecified: Secondary | ICD-10-CM | POA: Diagnosis not present

## 2019-05-03 DIAGNOSIS — Z23 Encounter for immunization: Secondary | ICD-10-CM | POA: Diagnosis not present

## 2019-05-03 DIAGNOSIS — N2581 Secondary hyperparathyroidism of renal origin: Secondary | ICD-10-CM | POA: Diagnosis not present

## 2019-05-03 DIAGNOSIS — N186 End stage renal disease: Secondary | ICD-10-CM | POA: Diagnosis not present

## 2019-05-04 DIAGNOSIS — E113511 Type 2 diabetes mellitus with proliferative diabetic retinopathy with macular edema, right eye: Secondary | ICD-10-CM | POA: Diagnosis not present

## 2019-05-04 DIAGNOSIS — H4052X3 Glaucoma secondary to other eye disorders, left eye, severe stage: Secondary | ICD-10-CM | POA: Diagnosis not present

## 2019-05-04 DIAGNOSIS — Z992 Dependence on renal dialysis: Secondary | ICD-10-CM | POA: Diagnosis not present

## 2019-05-04 DIAGNOSIS — H4051X3 Glaucoma secondary to other eye disorders, right eye, severe stage: Secondary | ICD-10-CM | POA: Diagnosis not present

## 2019-05-04 DIAGNOSIS — N2581 Secondary hyperparathyroidism of renal origin: Secondary | ICD-10-CM | POA: Diagnosis not present

## 2019-05-04 DIAGNOSIS — N186 End stage renal disease: Secondary | ICD-10-CM | POA: Diagnosis not present

## 2019-05-04 DIAGNOSIS — H211X1 Other vascular disorders of iris and ciliary body, right eye: Secondary | ICD-10-CM | POA: Diagnosis not present

## 2019-05-04 DIAGNOSIS — D509 Iron deficiency anemia, unspecified: Secondary | ICD-10-CM | POA: Diagnosis not present

## 2019-05-05 DIAGNOSIS — D509 Iron deficiency anemia, unspecified: Secondary | ICD-10-CM | POA: Diagnosis not present

## 2019-05-05 DIAGNOSIS — N186 End stage renal disease: Secondary | ICD-10-CM | POA: Diagnosis not present

## 2019-05-05 DIAGNOSIS — Z992 Dependence on renal dialysis: Secondary | ICD-10-CM | POA: Diagnosis not present

## 2019-05-05 DIAGNOSIS — N2581 Secondary hyperparathyroidism of renal origin: Secondary | ICD-10-CM | POA: Diagnosis not present

## 2019-05-06 DIAGNOSIS — N2581 Secondary hyperparathyroidism of renal origin: Secondary | ICD-10-CM | POA: Diagnosis not present

## 2019-05-06 DIAGNOSIS — D509 Iron deficiency anemia, unspecified: Secondary | ICD-10-CM | POA: Diagnosis not present

## 2019-05-06 DIAGNOSIS — N186 End stage renal disease: Secondary | ICD-10-CM | POA: Diagnosis not present

## 2019-05-06 DIAGNOSIS — Z992 Dependence on renal dialysis: Secondary | ICD-10-CM | POA: Diagnosis not present

## 2019-05-08 DIAGNOSIS — N186 End stage renal disease: Secondary | ICD-10-CM | POA: Diagnosis not present

## 2019-05-08 DIAGNOSIS — N2581 Secondary hyperparathyroidism of renal origin: Secondary | ICD-10-CM | POA: Diagnosis not present

## 2019-05-08 DIAGNOSIS — Z992 Dependence on renal dialysis: Secondary | ICD-10-CM | POA: Diagnosis not present

## 2019-05-08 DIAGNOSIS — D509 Iron deficiency anemia, unspecified: Secondary | ICD-10-CM | POA: Diagnosis not present

## 2019-05-10 DIAGNOSIS — I5022 Chronic systolic (congestive) heart failure: Secondary | ICD-10-CM | POA: Diagnosis not present

## 2019-05-10 DIAGNOSIS — E1122 Type 2 diabetes mellitus with diabetic chronic kidney disease: Secondary | ICD-10-CM | POA: Diagnosis not present

## 2019-05-10 DIAGNOSIS — Z992 Dependence on renal dialysis: Secondary | ICD-10-CM | POA: Diagnosis not present

## 2019-05-10 DIAGNOSIS — Z79899 Other long term (current) drug therapy: Secondary | ICD-10-CM | POA: Diagnosis not present

## 2019-05-10 DIAGNOSIS — R0602 Shortness of breath: Secondary | ICD-10-CM | POA: Diagnosis not present

## 2019-05-10 DIAGNOSIS — E1165 Type 2 diabetes mellitus with hyperglycemia: Secondary | ICD-10-CM | POA: Diagnosis not present

## 2019-05-10 DIAGNOSIS — J449 Chronic obstructive pulmonary disease, unspecified: Secondary | ICD-10-CM | POA: Diagnosis not present

## 2019-05-10 DIAGNOSIS — I132 Hypertensive heart and chronic kidney disease with heart failure and with stage 5 chronic kidney disease, or end stage renal disease: Secondary | ICD-10-CM | POA: Diagnosis not present

## 2019-05-10 DIAGNOSIS — N186 End stage renal disease: Secondary | ICD-10-CM | POA: Diagnosis not present

## 2019-05-10 DIAGNOSIS — N2581 Secondary hyperparathyroidism of renal origin: Secondary | ICD-10-CM | POA: Diagnosis not present

## 2019-05-10 DIAGNOSIS — D509 Iron deficiency anemia, unspecified: Secondary | ICD-10-CM | POA: Diagnosis not present

## 2019-05-10 DIAGNOSIS — Z794 Long term (current) use of insulin: Secondary | ICD-10-CM | POA: Diagnosis not present

## 2019-05-10 DIAGNOSIS — Z7982 Long term (current) use of aspirin: Secondary | ICD-10-CM | POA: Diagnosis not present

## 2019-05-10 DIAGNOSIS — Z89511 Acquired absence of right leg below knee: Secondary | ICD-10-CM | POA: Diagnosis not present

## 2019-05-10 DIAGNOSIS — I509 Heart failure, unspecified: Secondary | ICD-10-CM | POA: Diagnosis not present

## 2019-05-10 DIAGNOSIS — R079 Chest pain, unspecified: Secondary | ICD-10-CM | POA: Diagnosis not present

## 2019-05-12 DIAGNOSIS — D509 Iron deficiency anemia, unspecified: Secondary | ICD-10-CM | POA: Diagnosis not present

## 2019-05-12 DIAGNOSIS — N2581 Secondary hyperparathyroidism of renal origin: Secondary | ICD-10-CM | POA: Diagnosis not present

## 2019-05-12 DIAGNOSIS — Z992 Dependence on renal dialysis: Secondary | ICD-10-CM | POA: Diagnosis not present

## 2019-05-12 DIAGNOSIS — N186 End stage renal disease: Secondary | ICD-10-CM | POA: Diagnosis not present

## 2019-05-15 DIAGNOSIS — I444 Left anterior fascicular block: Secondary | ICD-10-CM | POA: Diagnosis not present

## 2019-05-15 DIAGNOSIS — I440 Atrioventricular block, first degree: Secondary | ICD-10-CM | POA: Diagnosis not present

## 2019-05-15 DIAGNOSIS — I132 Hypertensive heart and chronic kidney disease with heart failure and with stage 5 chronic kidney disease, or end stage renal disease: Secondary | ICD-10-CM | POA: Diagnosis not present

## 2019-05-15 DIAGNOSIS — I509 Heart failure, unspecified: Secondary | ICD-10-CM | POA: Diagnosis not present

## 2019-05-15 DIAGNOSIS — J9801 Acute bronchospasm: Secondary | ICD-10-CM | POA: Diagnosis not present

## 2019-05-15 DIAGNOSIS — Z79899 Other long term (current) drug therapy: Secondary | ICD-10-CM | POA: Diagnosis not present

## 2019-05-15 DIAGNOSIS — Z880 Allergy status to penicillin: Secondary | ICD-10-CM | POA: Diagnosis not present

## 2019-05-15 DIAGNOSIS — Z992 Dependence on renal dialysis: Secondary | ICD-10-CM | POA: Diagnosis not present

## 2019-05-15 DIAGNOSIS — E1122 Type 2 diabetes mellitus with diabetic chronic kidney disease: Secondary | ICD-10-CM | POA: Diagnosis not present

## 2019-05-15 DIAGNOSIS — N2581 Secondary hyperparathyroidism of renal origin: Secondary | ICD-10-CM | POA: Diagnosis not present

## 2019-05-15 DIAGNOSIS — N186 End stage renal disease: Secondary | ICD-10-CM | POA: Diagnosis not present

## 2019-05-15 DIAGNOSIS — D509 Iron deficiency anemia, unspecified: Secondary | ICD-10-CM | POA: Diagnosis not present

## 2019-05-15 DIAGNOSIS — J449 Chronic obstructive pulmonary disease, unspecified: Secondary | ICD-10-CM | POA: Diagnosis not present

## 2019-05-15 DIAGNOSIS — Z794 Long term (current) use of insulin: Secondary | ICD-10-CM | POA: Diagnosis not present

## 2019-05-15 DIAGNOSIS — Z7982 Long term (current) use of aspirin: Secondary | ICD-10-CM | POA: Diagnosis not present

## 2019-05-15 DIAGNOSIS — R0602 Shortness of breath: Secondary | ICD-10-CM | POA: Diagnosis not present

## 2019-05-16 DIAGNOSIS — J449 Chronic obstructive pulmonary disease, unspecified: Secondary | ICD-10-CM | POA: Diagnosis not present

## 2019-05-16 DIAGNOSIS — Z6835 Body mass index (BMI) 35.0-35.9, adult: Secondary | ICD-10-CM | POA: Diagnosis not present

## 2019-05-16 DIAGNOSIS — E6609 Other obesity due to excess calories: Secondary | ICD-10-CM | POA: Diagnosis not present

## 2019-05-16 DIAGNOSIS — J441 Chronic obstructive pulmonary disease with (acute) exacerbation: Secondary | ICD-10-CM | POA: Diagnosis not present

## 2019-05-17 DIAGNOSIS — D509 Iron deficiency anemia, unspecified: Secondary | ICD-10-CM | POA: Diagnosis not present

## 2019-05-17 DIAGNOSIS — Z992 Dependence on renal dialysis: Secondary | ICD-10-CM | POA: Diagnosis not present

## 2019-05-17 DIAGNOSIS — N186 End stage renal disease: Secondary | ICD-10-CM | POA: Diagnosis not present

## 2019-05-17 DIAGNOSIS — N2581 Secondary hyperparathyroidism of renal origin: Secondary | ICD-10-CM | POA: Diagnosis not present

## 2019-05-18 ENCOUNTER — Other Ambulatory Visit: Payer: Medicare Other

## 2019-05-19 DIAGNOSIS — Z992 Dependence on renal dialysis: Secondary | ICD-10-CM | POA: Diagnosis not present

## 2019-05-19 DIAGNOSIS — N186 End stage renal disease: Secondary | ICD-10-CM | POA: Diagnosis not present

## 2019-05-19 DIAGNOSIS — D509 Iron deficiency anemia, unspecified: Secondary | ICD-10-CM | POA: Diagnosis not present

## 2019-05-19 DIAGNOSIS — N2581 Secondary hyperparathyroidism of renal origin: Secondary | ICD-10-CM | POA: Diagnosis not present

## 2019-05-22 ENCOUNTER — Telehealth: Payer: Self-pay | Admitting: Cardiology

## 2019-05-22 DIAGNOSIS — N186 End stage renal disease: Secondary | ICD-10-CM | POA: Diagnosis not present

## 2019-05-22 DIAGNOSIS — N2581 Secondary hyperparathyroidism of renal origin: Secondary | ICD-10-CM | POA: Diagnosis not present

## 2019-05-22 DIAGNOSIS — Z992 Dependence on renal dialysis: Secondary | ICD-10-CM | POA: Diagnosis not present

## 2019-05-22 DIAGNOSIS — D509 Iron deficiency anemia, unspecified: Secondary | ICD-10-CM | POA: Diagnosis not present

## 2019-05-22 NOTE — Telephone Encounter (Signed)

## 2019-05-23 ENCOUNTER — Ambulatory Visit (INDEPENDENT_AMBULATORY_CARE_PROVIDER_SITE_OTHER): Payer: Medicare Other

## 2019-05-23 ENCOUNTER — Other Ambulatory Visit: Payer: Self-pay

## 2019-05-23 DIAGNOSIS — I428 Other cardiomyopathies: Secondary | ICD-10-CM

## 2019-05-24 DIAGNOSIS — D509 Iron deficiency anemia, unspecified: Secondary | ICD-10-CM | POA: Diagnosis not present

## 2019-05-24 DIAGNOSIS — Z992 Dependence on renal dialysis: Secondary | ICD-10-CM | POA: Diagnosis not present

## 2019-05-24 DIAGNOSIS — N186 End stage renal disease: Secondary | ICD-10-CM | POA: Diagnosis not present

## 2019-05-24 DIAGNOSIS — N2581 Secondary hyperparathyroidism of renal origin: Secondary | ICD-10-CM | POA: Diagnosis not present

## 2019-05-25 ENCOUNTER — Telehealth: Payer: Self-pay | Admitting: *Deleted

## 2019-05-25 NOTE — Telephone Encounter (Signed)
-----   Message from Satira Sark, MD sent at 05/23/2019  2:16 PM EDT ----- Results reviewed.  Follow-up study shows normal LVEF at 60 to 65%.  Continue with current medications and follow-up plan.

## 2019-05-26 DIAGNOSIS — D509 Iron deficiency anemia, unspecified: Secondary | ICD-10-CM | POA: Diagnosis not present

## 2019-05-26 DIAGNOSIS — N186 End stage renal disease: Secondary | ICD-10-CM | POA: Diagnosis not present

## 2019-05-26 DIAGNOSIS — N2581 Secondary hyperparathyroidism of renal origin: Secondary | ICD-10-CM | POA: Diagnosis not present

## 2019-05-26 DIAGNOSIS — Z992 Dependence on renal dialysis: Secondary | ICD-10-CM | POA: Diagnosis not present

## 2019-05-27 DIAGNOSIS — R0602 Shortness of breath: Secondary | ICD-10-CM | POA: Diagnosis not present

## 2019-05-27 DIAGNOSIS — I132 Hypertensive heart and chronic kidney disease with heart failure and with stage 5 chronic kidney disease, or end stage renal disease: Secondary | ICD-10-CM | POA: Diagnosis not present

## 2019-05-27 DIAGNOSIS — I509 Heart failure, unspecified: Secondary | ICD-10-CM | POA: Diagnosis not present

## 2019-05-27 DIAGNOSIS — R0600 Dyspnea, unspecified: Secondary | ICD-10-CM | POA: Diagnosis not present

## 2019-05-27 DIAGNOSIS — Z79899 Other long term (current) drug therapy: Secondary | ICD-10-CM | POA: Diagnosis not present

## 2019-05-27 DIAGNOSIS — Z794 Long term (current) use of insulin: Secondary | ICD-10-CM | POA: Diagnosis not present

## 2019-05-27 DIAGNOSIS — E1122 Type 2 diabetes mellitus with diabetic chronic kidney disease: Secondary | ICD-10-CM | POA: Diagnosis not present

## 2019-05-27 DIAGNOSIS — N186 End stage renal disease: Secondary | ICD-10-CM | POA: Diagnosis not present

## 2019-05-27 DIAGNOSIS — Z992 Dependence on renal dialysis: Secondary | ICD-10-CM | POA: Diagnosis not present

## 2019-05-27 DIAGNOSIS — Z7982 Long term (current) use of aspirin: Secondary | ICD-10-CM | POA: Diagnosis not present

## 2019-05-27 DIAGNOSIS — J441 Chronic obstructive pulmonary disease with (acute) exacerbation: Secondary | ICD-10-CM | POA: Diagnosis not present

## 2019-05-29 DIAGNOSIS — Z992 Dependence on renal dialysis: Secondary | ICD-10-CM | POA: Diagnosis not present

## 2019-05-29 DIAGNOSIS — N2581 Secondary hyperparathyroidism of renal origin: Secondary | ICD-10-CM | POA: Diagnosis not present

## 2019-05-29 DIAGNOSIS — D509 Iron deficiency anemia, unspecified: Secondary | ICD-10-CM | POA: Diagnosis not present

## 2019-05-29 DIAGNOSIS — N186 End stage renal disease: Secondary | ICD-10-CM | POA: Diagnosis not present

## 2019-05-29 NOTE — Telephone Encounter (Signed)
Notes recorded by Laurine Blazer, LPN on 25/27/1292 at 4:30 PM EDT  Patient notified. Copy to pmd. Follow up scheduled for 06/13/19.

## 2019-05-31 DIAGNOSIS — N2581 Secondary hyperparathyroidism of renal origin: Secondary | ICD-10-CM | POA: Diagnosis not present

## 2019-05-31 DIAGNOSIS — I509 Heart failure, unspecified: Secondary | ICD-10-CM | POA: Diagnosis not present

## 2019-05-31 DIAGNOSIS — R0602 Shortness of breath: Secondary | ICD-10-CM | POA: Diagnosis not present

## 2019-05-31 DIAGNOSIS — Z888 Allergy status to other drugs, medicaments and biological substances status: Secondary | ICD-10-CM | POA: Diagnosis not present

## 2019-05-31 DIAGNOSIS — Z880 Allergy status to penicillin: Secondary | ICD-10-CM | POA: Diagnosis not present

## 2019-05-31 DIAGNOSIS — D509 Iron deficiency anemia, unspecified: Secondary | ICD-10-CM | POA: Diagnosis not present

## 2019-05-31 DIAGNOSIS — E1122 Type 2 diabetes mellitus with diabetic chronic kidney disease: Secondary | ICD-10-CM | POA: Diagnosis not present

## 2019-05-31 DIAGNOSIS — J441 Chronic obstructive pulmonary disease with (acute) exacerbation: Secondary | ICD-10-CM | POA: Diagnosis not present

## 2019-05-31 DIAGNOSIS — Z992 Dependence on renal dialysis: Secondary | ICD-10-CM | POA: Diagnosis not present

## 2019-05-31 DIAGNOSIS — Z91030 Bee allergy status: Secondary | ICD-10-CM | POA: Diagnosis not present

## 2019-05-31 DIAGNOSIS — Z86718 Personal history of other venous thrombosis and embolism: Secondary | ICD-10-CM | POA: Diagnosis not present

## 2019-05-31 DIAGNOSIS — N189 Chronic kidney disease, unspecified: Secondary | ICD-10-CM | POA: Diagnosis not present

## 2019-05-31 DIAGNOSIS — J986 Disorders of diaphragm: Secondary | ICD-10-CM | POA: Diagnosis not present

## 2019-05-31 DIAGNOSIS — I130 Hypertensive heart and chronic kidney disease with heart failure and stage 1 through stage 4 chronic kidney disease, or unspecified chronic kidney disease: Secondary | ICD-10-CM | POA: Diagnosis not present

## 2019-05-31 DIAGNOSIS — N186 End stage renal disease: Secondary | ICD-10-CM | POA: Diagnosis not present

## 2019-05-31 DIAGNOSIS — Z8249 Family history of ischemic heart disease and other diseases of the circulatory system: Secondary | ICD-10-CM | POA: Diagnosis not present

## 2019-05-31 DIAGNOSIS — Z833 Family history of diabetes mellitus: Secondary | ICD-10-CM | POA: Diagnosis not present

## 2019-06-02 DIAGNOSIS — N2581 Secondary hyperparathyroidism of renal origin: Secondary | ICD-10-CM | POA: Diagnosis not present

## 2019-06-02 DIAGNOSIS — N186 End stage renal disease: Secondary | ICD-10-CM | POA: Diagnosis not present

## 2019-06-02 DIAGNOSIS — Z992 Dependence on renal dialysis: Secondary | ICD-10-CM | POA: Diagnosis not present

## 2019-06-02 DIAGNOSIS — D509 Iron deficiency anemia, unspecified: Secondary | ICD-10-CM | POA: Diagnosis not present

## 2019-06-03 DIAGNOSIS — Z992 Dependence on renal dialysis: Secondary | ICD-10-CM | POA: Diagnosis not present

## 2019-06-03 DIAGNOSIS — N186 End stage renal disease: Secondary | ICD-10-CM | POA: Diagnosis not present

## 2019-06-04 DIAGNOSIS — Z992 Dependence on renal dialysis: Secondary | ICD-10-CM | POA: Diagnosis not present

## 2019-06-04 DIAGNOSIS — N2581 Secondary hyperparathyroidism of renal origin: Secondary | ICD-10-CM | POA: Diagnosis not present

## 2019-06-04 DIAGNOSIS — N186 End stage renal disease: Secondary | ICD-10-CM | POA: Diagnosis not present

## 2019-06-05 DIAGNOSIS — N186 End stage renal disease: Secondary | ICD-10-CM | POA: Diagnosis not present

## 2019-06-05 DIAGNOSIS — N2581 Secondary hyperparathyroidism of renal origin: Secondary | ICD-10-CM | POA: Diagnosis not present

## 2019-06-05 DIAGNOSIS — Z992 Dependence on renal dialysis: Secondary | ICD-10-CM | POA: Diagnosis not present

## 2019-06-07 DIAGNOSIS — Z992 Dependence on renal dialysis: Secondary | ICD-10-CM | POA: Diagnosis not present

## 2019-06-07 DIAGNOSIS — N2581 Secondary hyperparathyroidism of renal origin: Secondary | ICD-10-CM | POA: Diagnosis not present

## 2019-06-07 DIAGNOSIS — N186 End stage renal disease: Secondary | ICD-10-CM | POA: Diagnosis not present

## 2019-06-09 DIAGNOSIS — Z992 Dependence on renal dialysis: Secondary | ICD-10-CM | POA: Diagnosis not present

## 2019-06-09 DIAGNOSIS — N2581 Secondary hyperparathyroidism of renal origin: Secondary | ICD-10-CM | POA: Diagnosis not present

## 2019-06-09 DIAGNOSIS — N186 End stage renal disease: Secondary | ICD-10-CM | POA: Diagnosis not present

## 2019-06-10 DIAGNOSIS — S31030A Puncture wound without foreign body of lower back and pelvis without penetration into retroperitoneum, initial encounter: Secondary | ICD-10-CM | POA: Diagnosis not present

## 2019-06-10 DIAGNOSIS — S279XXA Injury of unspecified intrathoracic organ, initial encounter: Secondary | ICD-10-CM | POA: Diagnosis not present

## 2019-06-10 DIAGNOSIS — I129 Hypertensive chronic kidney disease with stage 1 through stage 4 chronic kidney disease, or unspecified chronic kidney disease: Secondary | ICD-10-CM | POA: Diagnosis not present

## 2019-06-10 DIAGNOSIS — Z992 Dependence on renal dialysis: Secondary | ICD-10-CM | POA: Diagnosis not present

## 2019-06-10 DIAGNOSIS — S21102A Unspecified open wound of left front wall of thorax without penetration into thoracic cavity, initial encounter: Secondary | ICD-10-CM | POA: Diagnosis not present

## 2019-06-10 DIAGNOSIS — E1122 Type 2 diabetes mellitus with diabetic chronic kidney disease: Secondary | ICD-10-CM | POA: Diagnosis not present

## 2019-06-10 DIAGNOSIS — N189 Chronic kidney disease, unspecified: Secondary | ICD-10-CM | POA: Diagnosis not present

## 2019-06-10 DIAGNOSIS — I509 Heart failure, unspecified: Secondary | ICD-10-CM | POA: Diagnosis not present

## 2019-06-10 DIAGNOSIS — W3400XA Accidental discharge from unspecified firearms or gun, initial encounter: Secondary | ICD-10-CM | POA: Diagnosis not present

## 2019-06-10 DIAGNOSIS — I469 Cardiac arrest, cause unspecified: Secondary | ICD-10-CM | POA: Diagnosis not present

## 2019-06-10 DIAGNOSIS — S21132A Puncture wound without foreign body of left front wall of thorax without penetration into thoracic cavity, initial encounter: Secondary | ICD-10-CM | POA: Diagnosis not present

## 2019-06-12 ENCOUNTER — Ambulatory Visit: Payer: Medicare Other | Admitting: Cardiology

## 2019-06-13 ENCOUNTER — Ambulatory Visit: Payer: Medicare Other | Admitting: Cardiology

## 2019-07-04 DEATH — deceased

## 2019-11-12 IMAGING — DX PORTABLE CHEST - 1 VIEW
1 series · 1 of 1 positions shown · non-contrast
Comparison: 06/25/2018 and earlier.

CLINICAL DATA: Attempted central venous catheter placement from a
LEFT-sided approach.

EXAM:
PORTABLE CHEST 1 VIEW

[chest]
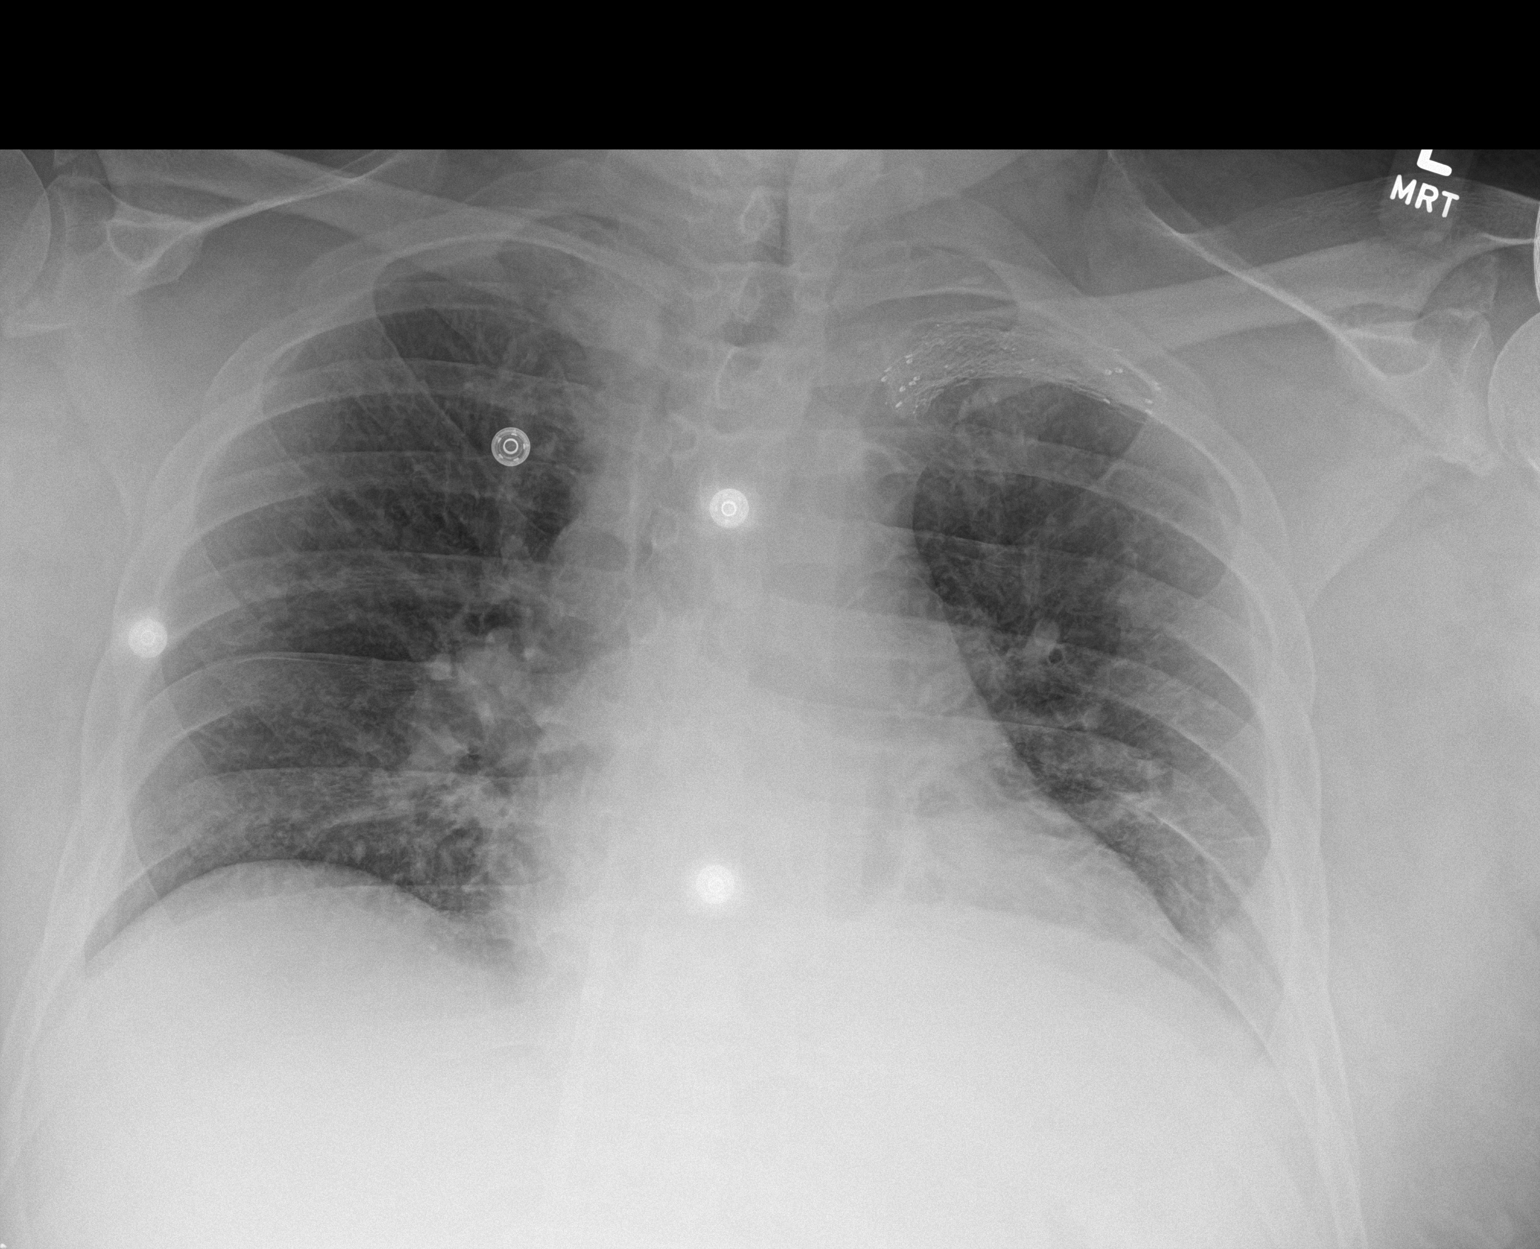

[1 of 1 positions shown; findings below may reference images not displayed]

FINDINGS: Suboptimal inspiration accounts for crowded bronchovascular
markings, especially in the bases, and accentuates the cardiac
silhouette. Taking this into account, cardiac silhouette normal in
size. Mild atelectasis at the LEFT base superimposed upon chronic
scarring. Lungs otherwise clear. No pneumothorax. LEFT subclavian
vein overlapping stents.
IMPRESSION: 1. No pneumothorax after attempted LEFT-sided central venous
catheter placement.
2. Suboptimal inspiration which accounts for mild LEFT basilar
atelectasis superimposed upon chronic scarring.

## 2020-03-17 IMAGING — XA IR THROMBECTOMY AV FISTULA W/THROMBOLYSIS INC/SHUNT/IMG RIGHT
12 of 16 series · 13 of 24 positions shown · IV contrast (IODINE)
Comparison: none

INDICATION: End-stage renal disease with a thrombosed right thigh AV graft.

[Series 2: body 4 care · 1 of 7 slices shown (1 of 9)]
[im 1/7]
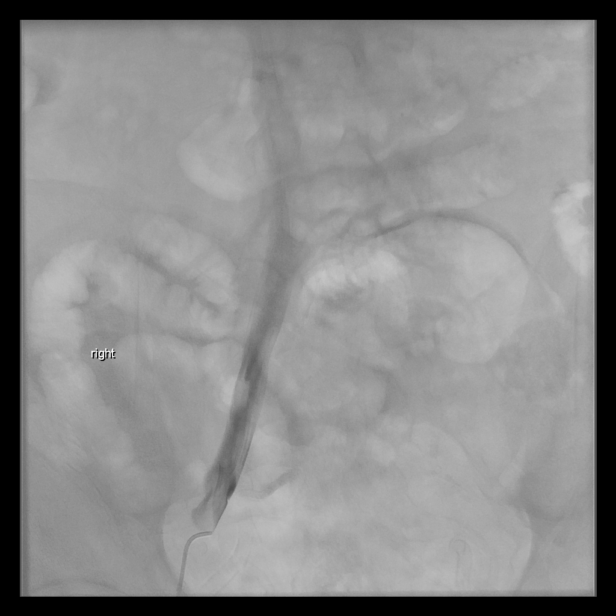

[Series 3: body 4 care · 1 of 5 slices shown (2 of 9)]
[im 1/5]
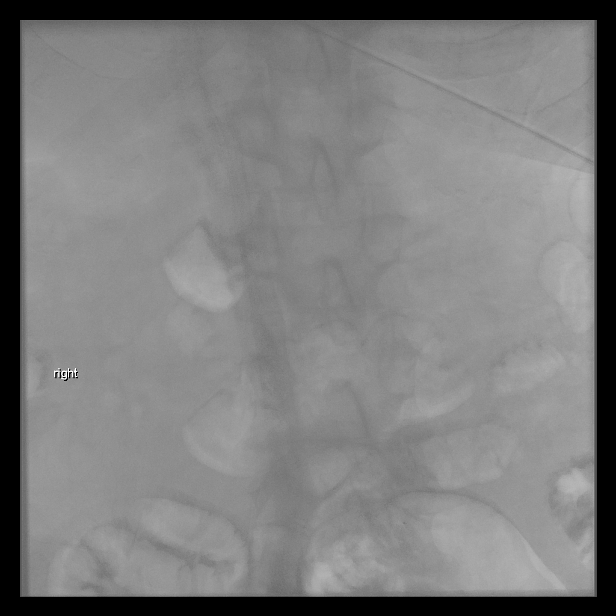

[Series 5: fl (-) angio · 1 of 2 slices shown (1 of 3)]
[im 1/2]
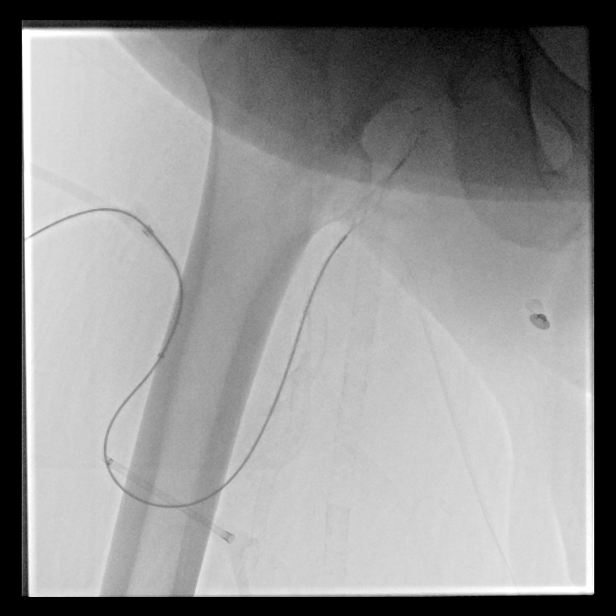

[Series 7: fl (-) angio · 1 of 1 slices shown (2 of 3)]
[im 1/1]
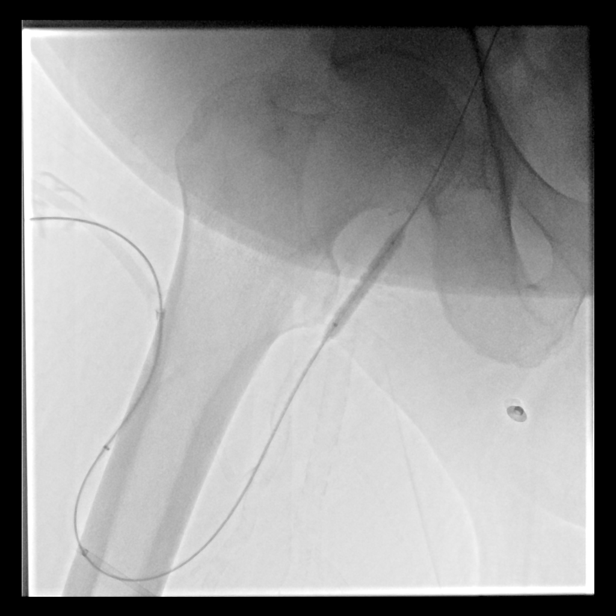

[Series 9: fl (-) angio · 1 of 1 slices shown (3 of 3)]
[im 1/1]
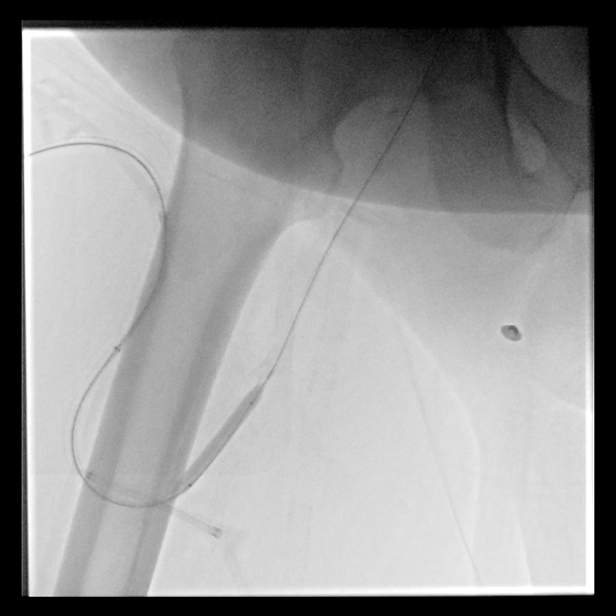

[Series 11: body 4 care · 1 of 6 slices shown (3 of 9)]
[im 1/6]
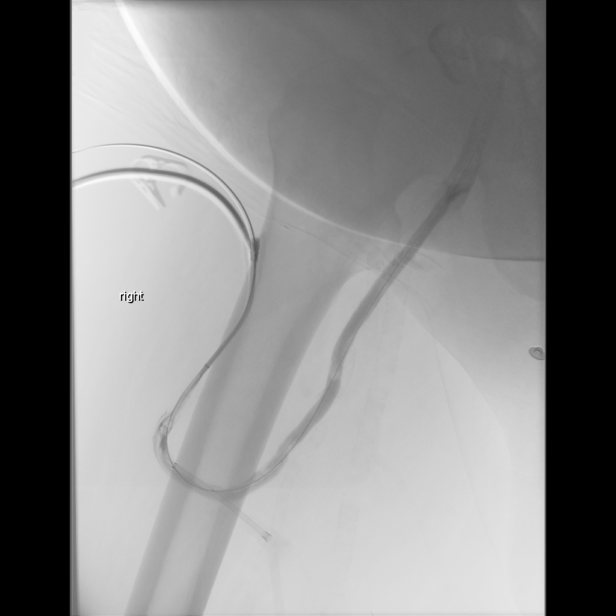

[Series 12: body 4 care · 1 of 2 slices shown (4 of 9)]
[im 1/2]
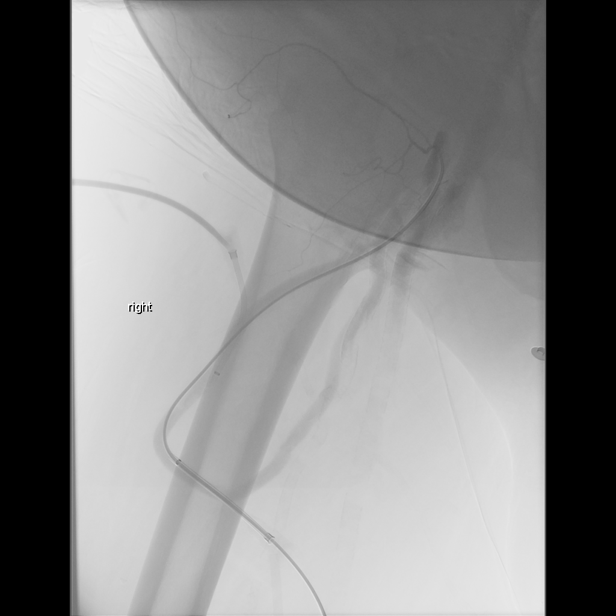

[Series 13: body 4 care · 1 of 5 slices shown (5 of 9)]
[im 1/5]
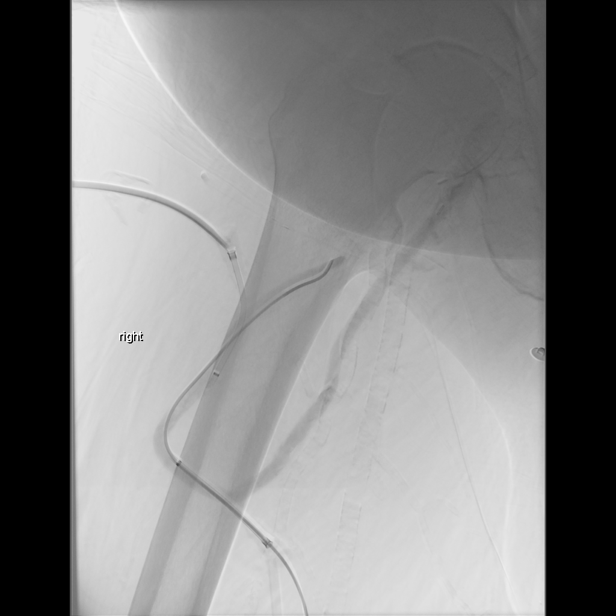

[Series 14: body 4 care · 1 of 4 slices shown (6 of 9)]
[im 4/4]
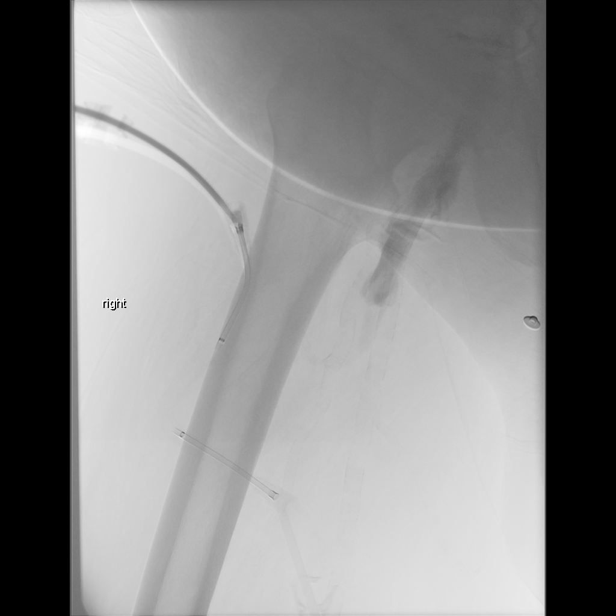

[Series 15: body 4 care · 1 of 6 slices shown (7 of 9)]
[im 3/6]
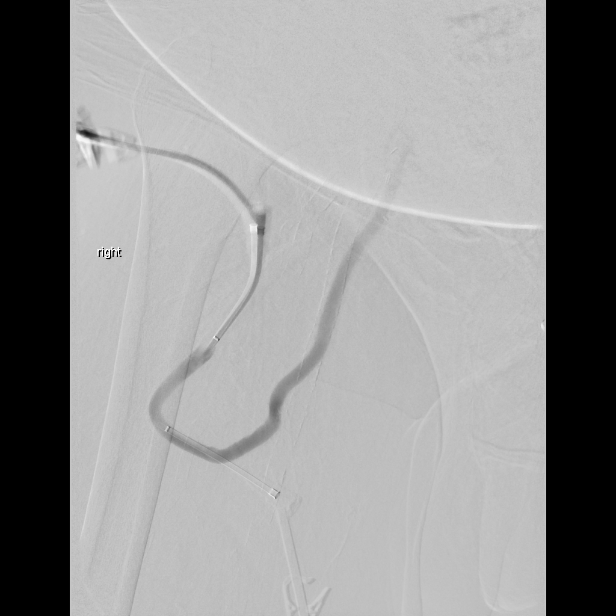

[Series 16: body 4 care · 2 of 7 slices shown (8 of 9)]
[im 1/7]
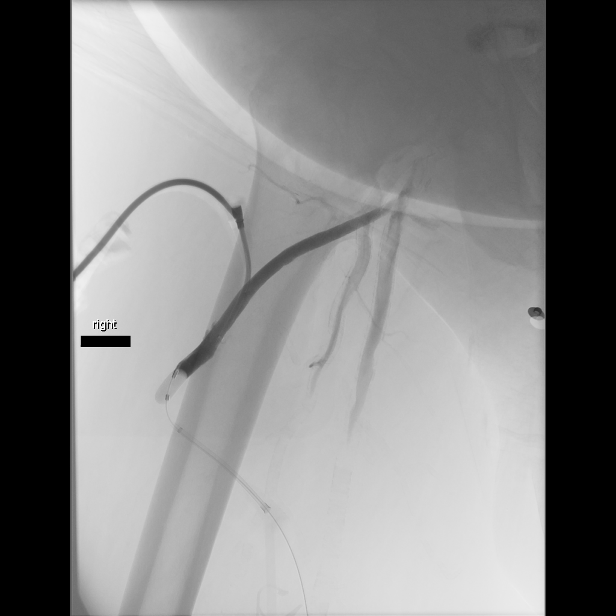
[im 7/7]
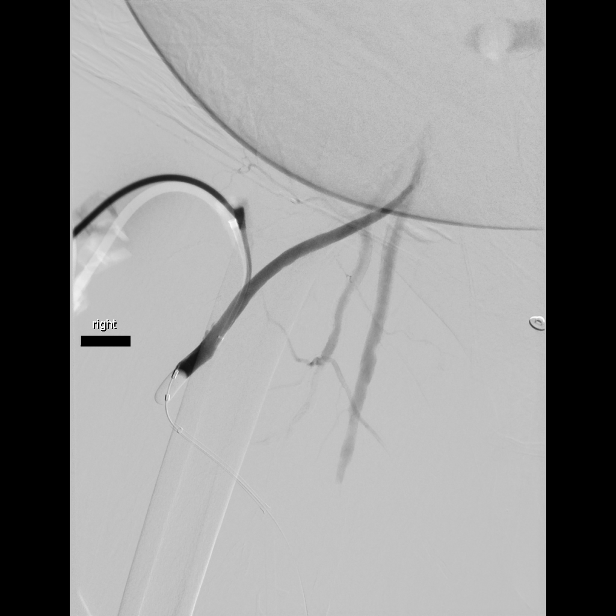

[Series 17: body 4 care · 1 of 5 slices shown (9 of 9)]
[im 5/5]
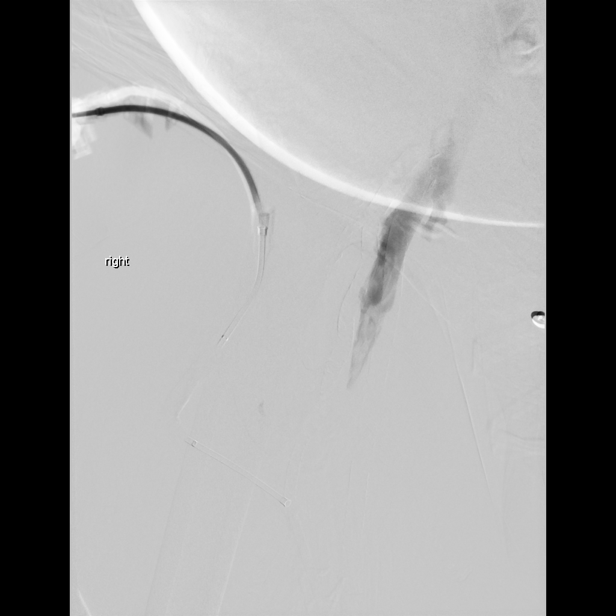

[13 of 24 positions shown; findings below may reference images not displayed]

EXAM:
DIALYSIS GRAFT DECLOT; GRAFT/VENOUS PTA
ULTRASOUND GUIDANCE FOR VASCULAR ACCESS

MEDICATIONS:
13 hour prep with prednisone due to iodine allergy.

ANESTHESIA/SEDATION:
Heparin 3000 units, TPA 2 mg, Benadryl 50 mg

The patient was continuously monitored during the procedure by the
interventional radiology nurse under my direct supervision.

FLUOROSCOPY TIME:  Fluoroscopy Time: 5 minutes, 36 seconds, 260 mGy

COMPLICATIONS:
None immediate.

PROCEDURE:
The procedure was explained to the patient. The risks and benefits
of the procedure were discussed and the patient's questions were
addressed. Informed consent was obtained from the patient. The right
thigh was prepped and draped in a sterile fashion. Maximal barrier
sterile technique was utilized including caps, mask, sterile gowns,
sterile gloves, sterile drape, hand hygiene and skin antiseptic. The
skin was anesthetized with 1% lidocaine. The graft was accessed
using 21 gauge needles towards the venous and arterial anastomoses
with ultrasound guidance. Ultrasound images were obtained for
documentation. Micropuncture catheters were placed. 2 mg of TPA was
infused through the micropuncture catheters. The vascular access
pointing towards the central veins was upsized to a 6-French
vascular sheath. A 5-French catheter was advanced into the central
venous structures and a central venogram was performed. Fluoroscopic
images were saved for documentation. The catheter pointing toward
the arterial anastomosis was exchanged for a 6-French sheath. The
graft was treated with the Arrow PTD thrombectomy device. The venous
anastomosis was angioplastied with a 6 mm x 40 mm balloon. A wire
was advanced into the arterial system. The arterial plug was pulled
using a 5 French Fogarty balloon. There was flow in the graft.
Additional graft clot was removed with the PTD thrombectomy device.
Final shuntogram images were obtained. The vascular sheaths were
removed with purse string sutures. There was bleeding at the access
site pointing towards the arterial anastomosis after removal of the
sheath. Bleeding stopped with manual compression. Patient developed
a small hematoma in the anterior thigh after the sheaths were
removed.
FINDINGS: Right AV graft was thrombosed. Central veins were patent. Mild waist
formation at the venous anastomoses during angioplasty with the 6 mm
balloon. No significant stenosis at the venous anastomosis at the
end of the procedure. Following the declot procedure, the graft was
widely patent. Final shuntogram images demonstrate a small amount of
bleeding at old access site near the apex of the graft.
IMPRESSION: Successful declot of the right thigh AV graft.

The right thigh AV graft remains amenable to percutaneous
intervention.
